# Patient Record
Sex: Male | Born: 1950 | Race: White | Hispanic: No | Marital: Married | Smoking: Former smoker
Health system: Southern US, Community
[De-identification: ages and names within clinical notes are randomized; demographics above are authoritative.]

## PROBLEM LIST (undated history)

## (undated) DIAGNOSIS — K219 Gastro-esophageal reflux disease without esophagitis: Secondary | ICD-10-CM

## (undated) DIAGNOSIS — R9431 Abnormal electrocardiogram [ECG] [EKG]: Secondary | ICD-10-CM

## (undated) DIAGNOSIS — R3129 Other microscopic hematuria: Secondary | ICD-10-CM

## (undated) DIAGNOSIS — I8390 Asymptomatic varicose veins of unspecified lower extremity: Secondary | ICD-10-CM

## (undated) DIAGNOSIS — I10 Essential (primary) hypertension: Secondary | ICD-10-CM

## (undated) DIAGNOSIS — Z953 Presence of xenogenic heart valve: Secondary | ICD-10-CM

## (undated) DIAGNOSIS — I509 Heart failure, unspecified: Secondary | ICD-10-CM

## (undated) DIAGNOSIS — T7840XA Allergy, unspecified, initial encounter: Secondary | ICD-10-CM

## (undated) DIAGNOSIS — J90 Pleural effusion, not elsewhere classified: Secondary | ICD-10-CM

## (undated) DIAGNOSIS — I4891 Unspecified atrial fibrillation: Secondary | ICD-10-CM

## (undated) DIAGNOSIS — H269 Unspecified cataract: Secondary | ICD-10-CM

## (undated) DIAGNOSIS — I7101 Dissection of thoracic aorta: Secondary | ICD-10-CM

## (undated) DIAGNOSIS — M1990 Unspecified osteoarthritis, unspecified site: Secondary | ICD-10-CM

## (undated) DIAGNOSIS — E785 Hyperlipidemia, unspecified: Secondary | ICD-10-CM

## (undated) DIAGNOSIS — N400 Enlarged prostate without lower urinary tract symptoms: Secondary | ICD-10-CM

## (undated) DIAGNOSIS — J9622 Acute and chronic respiratory failure with hypercapnia: Secondary | ICD-10-CM

## (undated) DIAGNOSIS — Z9889 Other specified postprocedural states: Secondary | ICD-10-CM

## (undated) HISTORY — DX: Abnormal electrocardiogram (ECG) (EKG): R94.31

## (undated) HISTORY — PX: TRACHEOSTOMY: SUR1362

## (undated) HISTORY — DX: Pleural effusion, not elsewhere classified: J90

## (undated) HISTORY — DX: Other microscopic hematuria: R31.29

## (undated) HISTORY — PX: COLONOSCOPY: SHX174

## (undated) HISTORY — PX: ROTATOR CUFF REPAIR: SHX139

## (undated) HISTORY — DX: Gastro-esophageal reflux disease without esophagitis: K21.9

## (undated) HISTORY — DX: Unspecified cataract: H26.9

## (undated) HISTORY — DX: Allergy, unspecified, initial encounter: T78.40XA

## (undated) HISTORY — DX: Unspecified osteoarthritis, unspecified site: M19.90

## (undated) HISTORY — DX: Unspecified atrial fibrillation: I48.91

## (undated) HISTORY — DX: Benign prostatic hyperplasia without lower urinary tract symptoms: N40.0

## (undated) HISTORY — PX: CARDIAC CATHETERIZATION: SHX172

## (undated) HISTORY — DX: Essential (primary) hypertension: I10

## (undated) HISTORY — PX: EYE SURGERY: SHX253

## (undated) HISTORY — DX: Hyperlipidemia, unspecified: E78.5

## (undated) HISTORY — DX: Asymptomatic varicose veins of unspecified lower extremity: I83.90

## (undated) HISTORY — PX: POLYPECTOMY: SHX149

## (undated) HISTORY — PX: APPENDECTOMY: SHX54

---

## 1976-08-10 HISTORY — PX: CYSTOSCOPY: SUR368

## 1984-08-10 HISTORY — PX: SPINAL FUSION: SHX223

## 1985-08-10 HISTORY — PX: LUMBAR LAMINECTOMY: SHX95

## 2000-03-09 ENCOUNTER — Encounter: Payer: Self-pay | Admitting: Internal Medicine

## 2000-03-09 ENCOUNTER — Inpatient Hospital Stay (HOSPITAL_COMMUNITY): Admission: EM | Admit: 2000-03-09 | Discharge: 2000-03-12 | Payer: Self-pay

## 2000-06-13 ENCOUNTER — Ambulatory Visit (HOSPITAL_COMMUNITY): Admission: RE | Admit: 2000-06-13 | Discharge: 2000-06-13 | Payer: Self-pay | Admitting: Specialist

## 2000-06-13 ENCOUNTER — Encounter: Payer: Self-pay | Admitting: Specialist

## 2002-01-06 ENCOUNTER — Ambulatory Visit (HOSPITAL_BASED_OUTPATIENT_CLINIC_OR_DEPARTMENT_OTHER): Admission: RE | Admit: 2002-01-06 | Discharge: 2002-01-06 | Payer: Self-pay | Admitting: Pulmonary Disease

## 2003-03-01 ENCOUNTER — Encounter: Payer: Self-pay | Admitting: Cardiology

## 2003-03-01 ENCOUNTER — Ambulatory Visit (HOSPITAL_COMMUNITY): Admission: RE | Admit: 2003-03-01 | Discharge: 2003-03-01 | Payer: Self-pay | Admitting: Cardiology

## 2003-10-19 ENCOUNTER — Encounter: Payer: Self-pay | Admitting: Internal Medicine

## 2004-10-31 ENCOUNTER — Ambulatory Visit: Payer: Self-pay | Admitting: Internal Medicine

## 2005-01-23 ENCOUNTER — Ambulatory Visit: Payer: Self-pay | Admitting: Internal Medicine

## 2005-02-06 ENCOUNTER — Ambulatory Visit: Payer: Self-pay | Admitting: Internal Medicine

## 2005-08-10 HISTORY — PX: MYELOGRAM: SHX5347

## 2005-09-23 ENCOUNTER — Ambulatory Visit: Payer: Self-pay | Admitting: Internal Medicine

## 2005-09-28 ENCOUNTER — Inpatient Hospital Stay (HOSPITAL_COMMUNITY): Admission: EM | Admit: 2005-09-28 | Discharge: 2005-09-29 | Payer: Self-pay | Admitting: Emergency Medicine

## 2005-09-28 ENCOUNTER — Ambulatory Visit: Payer: Self-pay | Admitting: Internal Medicine

## 2005-09-28 ENCOUNTER — Encounter: Payer: Self-pay | Admitting: Cardiology

## 2005-10-09 ENCOUNTER — Ambulatory Visit: Payer: Self-pay | Admitting: Cardiology

## 2005-10-29 ENCOUNTER — Encounter: Admission: RE | Admit: 2005-10-29 | Discharge: 2005-10-29 | Payer: Self-pay | Admitting: Orthopedic Surgery

## 2005-11-06 ENCOUNTER — Ambulatory Visit: Payer: Self-pay | Admitting: Internal Medicine

## 2005-11-27 ENCOUNTER — Ambulatory Visit: Payer: Self-pay | Admitting: Internal Medicine

## 2006-04-16 ENCOUNTER — Ambulatory Visit: Payer: Self-pay | Admitting: Internal Medicine

## 2006-07-09 ENCOUNTER — Ambulatory Visit: Payer: Self-pay | Admitting: Internal Medicine

## 2006-11-12 ENCOUNTER — Ambulatory Visit: Payer: Self-pay | Admitting: Internal Medicine

## 2006-11-12 LAB — CONVERTED CEMR LAB
Alkaline Phosphatase: 83 units/L (ref 39–117)
BUN: 6 mg/dL (ref 6–23)
Basophils Relative: 0.6 % (ref 0.0–1.0)
Bilirubin, Direct: 0.1 mg/dL (ref 0.0–0.3)
CO2: 33 meq/L — ABNORMAL HIGH (ref 19–32)
Calcium: 9.1 mg/dL (ref 8.4–10.5)
Cholesterol: 209 mg/dL (ref 0–200)
Direct LDL: 147.4 mg/dL
Eosinophils Absolute: 0.2 10*3/uL (ref 0.0–0.6)
GFR calc Af Amer: 129 mL/min
GFR calc non Af Amer: 106 mL/min
Glucose, Bld: 91 mg/dL (ref 70–99)
HDL: 47.3 mg/dL (ref >39.0)
Hemoglobin: 16.8 g/dL (ref 13.0–17.0)
Lymphocytes Relative: 18.9 % (ref 12.0–46.0)
MCV: 98.4 fL (ref 78.0–100.0)
Monocytes Absolute: 0.6 10*3/uL (ref 0.2–0.7)
Monocytes Relative: 7.8 % (ref 3.0–11.0)
Neutro Abs: 5.6 10*3/uL (ref 1.4–7.7)
Platelets: 356 10*3/uL (ref 150–400)
Potassium: 4.2 meq/L (ref 3.5–5.1)
TSH: 0.93 microintl units/mL (ref 0.35–5.50)
Total Protein: 6.4 g/dL (ref 6.0–8.3)
VLDL: 27 mg/dL (ref 0–40)

## 2006-11-19 ENCOUNTER — Ambulatory Visit: Payer: Self-pay | Admitting: Internal Medicine

## 2007-12-08 ENCOUNTER — Ambulatory Visit: Payer: Self-pay | Admitting: Internal Medicine

## 2008-03-09 ENCOUNTER — Ambulatory Visit: Payer: Self-pay | Admitting: Internal Medicine

## 2008-03-09 DIAGNOSIS — N400 Enlarged prostate without lower urinary tract symptoms: Secondary | ICD-10-CM

## 2008-03-09 DIAGNOSIS — R3129 Other microscopic hematuria: Secondary | ICD-10-CM

## 2008-03-09 DIAGNOSIS — F17200 Nicotine dependence, unspecified, uncomplicated: Secondary | ICD-10-CM | POA: Insufficient documentation

## 2008-03-09 DIAGNOSIS — K219 Gastro-esophageal reflux disease without esophagitis: Secondary | ICD-10-CM

## 2008-03-09 DIAGNOSIS — I4891 Unspecified atrial fibrillation: Secondary | ICD-10-CM | POA: Insufficient documentation

## 2008-03-09 DIAGNOSIS — E785 Hyperlipidemia, unspecified: Secondary | ICD-10-CM | POA: Insufficient documentation

## 2008-03-09 LAB — CONVERTED CEMR LAB
Cholesterol, target level: 200 mg/dL
Glucose, Urine, Semiquant: NEGATIVE
Nitrite: NEGATIVE
Specific Gravity, Urine: 1.01
WBC Urine, dipstick: NEGATIVE
pH: 6.5

## 2008-03-10 ENCOUNTER — Encounter: Payer: Self-pay | Admitting: Internal Medicine

## 2008-03-12 ENCOUNTER — Encounter (INDEPENDENT_AMBULATORY_CARE_PROVIDER_SITE_OTHER): Payer: Self-pay | Admitting: *Deleted

## 2008-03-13 ENCOUNTER — Telehealth (INDEPENDENT_AMBULATORY_CARE_PROVIDER_SITE_OTHER): Payer: Self-pay | Admitting: *Deleted

## 2008-03-13 LAB — CONVERTED CEMR LAB
ALT: 21 units/L (ref 0–53)
Albumin: 4.1 g/dL (ref 3.5–5.2)
BUN: 8 mg/dL (ref 6–23)
Basophils Relative: 0.9 % (ref 0.0–3.0)
Bilirubin, Direct: 0.1 mg/dL (ref 0.0–0.3)
CO2: 30 meq/L (ref 19–32)
Calcium: 9.4 mg/dL (ref 8.4–10.5)
Eosinophils Relative: 2.8 % (ref 0.0–5.0)
Glucose, Bld: 87 mg/dL (ref 70–99)
HCT: 49.9 % (ref 39.0–52.0)
Hemoglobin: 17.1 g/dL — ABNORMAL HIGH (ref 13.0–17.0)
Lymphocytes Relative: 18.4 % (ref 12.0–46.0)
Monocytes Relative: 9 % (ref 3.0–12.0)
Neutro Abs: 4.4 10*3/uL (ref 1.4–7.7)
PSA: 0.34 ng/mL (ref 0.10–4.00)
RBC: 5.07 M/uL (ref 4.22–5.81)
RDW: 12 % (ref 11.5–14.6)
Sodium: 136 meq/L (ref 135–145)
Total CHOL/HDL Ratio: 5.5
Total Protein: 6.9 g/dL (ref 6.0–8.3)
VLDL: 25 mg/dL (ref 0–40)
WBC: 6.5 10*3/uL (ref 4.5–10.5)

## 2008-03-16 ENCOUNTER — Ambulatory Visit: Payer: Self-pay | Admitting: Internal Medicine

## 2008-03-19 ENCOUNTER — Encounter (INDEPENDENT_AMBULATORY_CARE_PROVIDER_SITE_OTHER): Payer: Self-pay | Admitting: *Deleted

## 2008-04-03 ENCOUNTER — Emergency Department (HOSPITAL_COMMUNITY): Admission: EM | Admit: 2008-04-03 | Discharge: 2008-04-03 | Payer: Self-pay | Admitting: Emergency Medicine

## 2008-08-21 ENCOUNTER — Ambulatory Visit: Payer: Self-pay | Admitting: Internal Medicine

## 2008-11-14 DIAGNOSIS — I4510 Unspecified right bundle-branch block: Secondary | ICD-10-CM

## 2008-11-15 ENCOUNTER — Encounter: Payer: Self-pay | Admitting: Internal Medicine

## 2008-11-15 ENCOUNTER — Ambulatory Visit: Payer: Self-pay | Admitting: Internal Medicine

## 2009-04-11 ENCOUNTER — Ambulatory Visit: Payer: Self-pay | Admitting: Internal Medicine

## 2009-04-18 ENCOUNTER — Ambulatory Visit: Payer: Self-pay | Admitting: Internal Medicine

## 2009-04-19 ENCOUNTER — Encounter (INDEPENDENT_AMBULATORY_CARE_PROVIDER_SITE_OTHER): Payer: Self-pay | Admitting: *Deleted

## 2009-04-19 LAB — CONVERTED CEMR LAB
ALT: 20 units/L (ref 0–53)
AST: 21 units/L (ref 0–37)
Albumin: 4.1 g/dL (ref 3.5–5.2)
Eosinophils Relative: 2.4 % (ref 0.0–5.0)
GFR calc non Af Amer: 81.18 mL/min (ref >60)
Glucose, Bld: 79 mg/dL (ref 70–99)
HCT: 48.4 % (ref 39.0–52.0)
HDL: 51.5 mg/dL (ref >39.00)
Hemoglobin: 16.9 g/dL (ref 13.0–17.0)
Lymphs Abs: 1.6 10*3/uL (ref 0.7–4.0)
Monocytes Relative: 8.9 % (ref 3.0–12.0)
Neutro Abs: 5.2 10*3/uL (ref 1.4–7.7)
Potassium: 4.7 meq/L (ref 3.5–5.1)
Sodium: 137 meq/L (ref 135–145)
TSH: 1.08 microintl units/mL (ref 0.35–5.50)
VLDL: 17.4 mg/dL (ref 0.0–40.0)
WBC: 7.7 10*3/uL (ref 4.5–10.5)

## 2009-04-24 ENCOUNTER — Encounter (INDEPENDENT_AMBULATORY_CARE_PROVIDER_SITE_OTHER): Payer: Self-pay | Admitting: *Deleted

## 2009-04-24 ENCOUNTER — Telehealth (INDEPENDENT_AMBULATORY_CARE_PROVIDER_SITE_OTHER): Payer: Self-pay | Admitting: *Deleted

## 2009-07-08 ENCOUNTER — Ambulatory Visit: Payer: Self-pay | Admitting: Internal Medicine

## 2009-07-09 ENCOUNTER — Encounter (INDEPENDENT_AMBULATORY_CARE_PROVIDER_SITE_OTHER): Payer: Self-pay | Admitting: *Deleted

## 2009-07-09 LAB — CONVERTED CEMR LAB
AST: 21 units/L (ref 0–37)
Alkaline Phosphatase: 81 units/L (ref 39–117)
Direct LDL: 152.5 mg/dL
Total Bilirubin: 1.1 mg/dL (ref 0.3–1.2)
Total CHOL/HDL Ratio: 4
Triglycerides: 104 mg/dL (ref 0.0–149.0)

## 2009-08-10 HISTORY — PX: SHOULDER ARTHROSCOPY: SHX128

## 2009-08-26 ENCOUNTER — Ambulatory Visit (HOSPITAL_COMMUNITY): Admission: RE | Admit: 2009-08-26 | Discharge: 2009-08-26 | Payer: Self-pay | Admitting: Specialist

## 2009-09-26 ENCOUNTER — Telehealth (INDEPENDENT_AMBULATORY_CARE_PROVIDER_SITE_OTHER): Payer: Self-pay | Admitting: *Deleted

## 2009-11-12 ENCOUNTER — Telehealth: Payer: Self-pay | Admitting: Internal Medicine

## 2009-11-13 ENCOUNTER — Telehealth (INDEPENDENT_AMBULATORY_CARE_PROVIDER_SITE_OTHER): Payer: Self-pay | Admitting: *Deleted

## 2009-12-02 ENCOUNTER — Ambulatory Visit: Payer: Self-pay | Admitting: Internal Medicine

## 2009-12-09 LAB — CONVERTED CEMR LAB
ALT: 21 units/L (ref 0–53)
Albumin: 4.1 g/dL (ref 3.5–5.2)
HDL: 56.6 mg/dL (ref >39.00)
Total Bilirubin: 0.7 mg/dL (ref 0.3–1.2)
VLDL: 21.8 mg/dL (ref 0.0–40.0)

## 2009-12-12 ENCOUNTER — Ambulatory Visit: Payer: Self-pay | Admitting: Internal Medicine

## 2009-12-17 ENCOUNTER — Telehealth (INDEPENDENT_AMBULATORY_CARE_PROVIDER_SITE_OTHER): Payer: Self-pay | Admitting: *Deleted

## 2009-12-18 ENCOUNTER — Encounter (HOSPITAL_COMMUNITY): Admission: RE | Admit: 2009-12-18 | Discharge: 2010-02-07 | Payer: Self-pay | Admitting: Internal Medicine

## 2009-12-18 ENCOUNTER — Ambulatory Visit: Payer: Self-pay | Admitting: Cardiology

## 2009-12-18 ENCOUNTER — Ambulatory Visit: Payer: Self-pay

## 2010-04-16 ENCOUNTER — Ambulatory Visit: Payer: Self-pay | Admitting: Internal Medicine

## 2010-04-16 LAB — CONVERTED CEMR LAB
Glucose, Urine, Semiquant: NEGATIVE
Nitrite: NEGATIVE
Protein, U semiquant: NEGATIVE
WBC Urine, dipstick: NEGATIVE
pH: 6

## 2010-04-17 ENCOUNTER — Encounter: Payer: Self-pay | Admitting: Internal Medicine

## 2010-04-21 ENCOUNTER — Telehealth (INDEPENDENT_AMBULATORY_CARE_PROVIDER_SITE_OTHER): Payer: Self-pay | Admitting: *Deleted

## 2010-04-21 LAB — CONVERTED CEMR LAB
ALT: 25 units/L (ref 0–53)
AST: 22 units/L (ref 0–37)
Basophils Relative: 0.2 % (ref 0.0–3.0)
Chloride: 101 meq/L (ref 96–112)
Eosinophils Relative: 1.7 % (ref 0.0–5.0)
GFR calc non Af Amer: 101.72 mL/min (ref >60)
HCT: 44.8 % (ref 39.0–52.0)
HDL: 55.7 mg/dL (ref >39.00)
Hemoglobin: 15.6 g/dL (ref 13.0–17.0)
Lymphs Abs: 1.6 10*3/uL (ref 0.7–4.0)
MCV: 99.5 fL (ref 78.0–100.0)
Monocytes Absolute: 0.6 10*3/uL (ref 0.1–1.0)
Monocytes Relative: 7.5 % (ref 3.0–12.0)
Neutro Abs: 5.8 10*3/uL (ref 1.4–7.7)
Potassium: 4.6 meq/L (ref 3.5–5.1)
RBC: 4.5 M/uL (ref 4.22–5.81)
Sodium: 139 meq/L (ref 135–145)
TSH: 1.11 microintl units/mL (ref 0.35–5.50)
Total Bilirubin: 0.4 mg/dL (ref 0.3–1.2)
Total Protein: 6.9 g/dL (ref 6.0–8.3)
VLDL: 35.6 mg/dL (ref 0.0–40.0)
WBC: 8.2 10*3/uL (ref 4.5–10.5)

## 2010-05-21 ENCOUNTER — Telehealth: Payer: Self-pay | Admitting: Internal Medicine

## 2010-08-10 HISTORY — PX: KNEE ARTHROSCOPY: SHX127

## 2010-08-20 ENCOUNTER — Ambulatory Visit
Admission: RE | Admit: 2010-08-20 | Discharge: 2010-08-20 | Payer: Self-pay | Source: Home / Self Care | Attending: Internal Medicine | Admitting: Internal Medicine

## 2010-08-20 DIAGNOSIS — J019 Acute sinusitis, unspecified: Secondary | ICD-10-CM | POA: Insufficient documentation

## 2010-09-09 NOTE — Progress Notes (Signed)
  Phone Note From Other Clinic   Caller: [facility] Details for Reason: Pt.Information Initial call taken by: KM    Faxed all Cardiac over to [facility] @ [phone] [facility]  September 26, 2009 10:07 AM

## 2010-09-09 NOTE — Progress Notes (Signed)
Summary: Culture Results  Phone Note Outgoing Call Call back at [facility] Phone [phone]   Call placed by: Shonna Chock CMA,  April 21, 2010 8:25 AM Call placed to: Patient Summary of Call: Spoke with patient in ref to Urine Culture: Significant would be > 100,000 colonies. Force water to 40 oz/ day. Macrodantin 100 mg two times a day #14 only if  symptoms appear. Hopp  Patient ok'd information and had no questions at the time of call Shonna Chock CMA  April 21, 2010 8:25 AM

## 2010-09-09 NOTE — Assessment & Plan Note (Signed)
Summary: 1 year rov   History of Present Illness: [name] returns in follow for atrial fibrillation. He continues to take flecainide he's been largely asymptomatic. he is smoking still but only down to a pack a day. He is rolling his own cigarettes and he thinks that these are less toxic.  Current Medications (verified): 1)  Cartia Xt 180 Mg  Xr24h-Cap (Diltiazem Hcl Coated Beads) .Marland Kitchen.. 1 By Mouth Once Daily 2)  Nexium 40 Mg  Pack (Esomeprazole Magnesium) .Marland Kitchen.. 1 By Mouth Once Daily 3)  Flecainide Acetate 100 Mg  Tabs (Flecainide Acetate) .Marland Kitchen.. 1 By Mouth Two Times A Day 4)  Viagra 100 Mg  Tabs (Sildenafil Citrate) .... 1/2 -1 Once Daily Prn 5)  Crestor 20 Mg Tabs (Rosuvastatin Calcium) .Marland Kitchen.. 1 Pill Every Wed and Sat  Allergies: 1)  ! Nsaids 2)  ! Celebrex 3)  ! Keflex 4)  ! Zocor 5)  ! Codeine  Past History:  Past Medical History: Last updated: 04/11/2009 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 LUTS (ICD-600.90) MICROSCOPIC HEMATURIA (ICD-599.72), Dr Karalee Height SMOKER (ICD-305.1) HYPERLIPIDEMIA (ICD-272.4), LDL goal = < 120 GERD (ICD-530.81) ATRIAL FIBRILLATION (ICD-427.31), Dr Berton Mount  Vital Signs:  Patient profile:   60 year old male Height:      72.5 inches Weight:      192 pounds BMI:     25.77 Pulse rate:   68 / minute BP sitting:   110 / 80  (left arm)  Vitals Entered By: Laurance Flatten CMA (Dec 12, 2009 2:34 PM)  Physical Exam  General:  The patient was alert and oriented in no acute distress. HEENT Normal.  Neck veins were flat, carotids were brisk.  Lungs were clear.  Heart sounds were regular without murmurs or gallops.  Abdomen was soft with active bowel sounds. There is no clubbing cyanosis or edema. Skin Warm and dry    EKG  Procedure date:  12/12/2009  Findings:      sus rhythm at 68 Intervals 0.16/0.13/0.42 Right bundle branch block  Impression & Recommendations:  Problem # 1:  ATRIAL FIBRILLATION (ICD-427.31)  Maintaining  sinus rhythm on flecainide. As best as I can tell it has been 7 years since his last stress test. We will need to undertake a repeat stress test on Mr. Kreis so as to make sure he we will be able to tolerate his flecainide. His updated medication list for this problem includes:    Flecainide Acetate 100 Mg Tabs (Flecainide acetate) .Marland Kitchen... 1 by mouth two times a day  His updated medication list for this problem includes:    Flecainide Acetate 100 Mg Tabs (Flecainide acetate) .Marland Kitchen... 1 by mouth two times a day  Orders: Nuclear Stress Test (Nuc Stress Test)  Problem # 2:  NONSPECIFIC ABNORMAL ELECTROCARDIOGRAM (ICD-794.31)  Unchanged RBBB His updated medication list for this problem includes:    Cartia Xt 180 Mg Xr24h-cap (Diltiazem hcl coated beads) .Marland Kitchen... 1 by mouth once daily    Flecainide Acetate 100 Mg Tabs (Flecainide acetate) .Marland Kitchen... 1 by mouth two times a day  His updated medication list for this problem includes:    Cartia Xt 180 Mg Xr24h-cap (Diltiazem hcl coated beads) .Marland Kitchen... 1 by mouth once daily    Flecainide Acetate 100 Mg Tabs (Flecainide acetate) .Marland Kitchen... 1 by mouth two times a day  Problem # 3:  SMOKER (ICD-305.1)  Encouraged again today to stop smoking  Orders: Nuclear Stress Test (Nuc Stress Test)  Patient Instructions: 1)  Your physician recommends that you schedule a follow-up appointment in: 12 months with Dr Graciela Husbands 2)  Your physician has requested that you have an exercise stress myoview.  For further information please visit [email or web address]  Please follow instruction sheet, as given. Prescriptions: FLECAINIDE ACETATE 100 MG  TABS (FLECAINIDE ACETATE) 1 by mouth two times a day  #180 x 3   Entered by:   Dennis Bast, RN, BSN   Authorized by:   Nathen May, MD, [facility]   Signed by:   Dennis Bast, RN, BSN on 12/12/2009   Method used:   Electronically to        [facility]* (mail-order)             ,          Ph: [phone]       Fax: [phone]    RxID:   [id number] CARTIA XT 180 MG  XR24H-CAP (DILTIAZEM HCL COATED BEADS) 1 by mouth once daily  #90 x 3   Entered by:   Dennis Bast, RN, BSN   Authorized by:   Nathen May, MD, [facility]   Signed by:   Dennis Bast, RN, BSN on 12/12/2009   Method used:   Electronically to        [facility]* (mail-order)             ,          Ph: [phone]       Fax: [phone]   RxID:   [id number] CARTIA XT 180 MG  XR24H-CAP (DILTIAZEM HCL COATED BEADS) 1 by mouth once daily  #90 x 3   Entered by:   Dennis Bast, RN, BSN   Authorized by:   Nathen May, MD, [facility]   Signed by:   Dennis Bast, RN, BSN on 12/12/2009   Method used:   [facility]  [address] [phone]* (retail)       [address]       [place], [place]  [address]       Ph: [phone] or [phone]       Fax: [phone]   RxID:   [id number] FLECAINIDE ACETATE 100 MG  TABS (FLECAINIDE ACETATE) 1 by mouth two times a day  #180 x 3   Entered by:   Dennis Bast, RN, BSN   Authorized by:   Nathen May, MD, [facility]   Signed by:   Dennis Bast, RN, BSN on 12/12/2009   Method used:   [facility] to        [facility] [phone][facility] (retail)       [address]       [place], [place]  [address]       Ph: [phone] or [phone]       Fax: [phone]   RxID:   [id number]

## 2010-09-09 NOTE — Assessment & Plan Note (Signed)
Summary: Cardiology Nuclear Study  Nuclear Med Background Indications for Stress Test: Evaluation for Ischemia, Abnormal EKG   History: Echo, Heart Catheterization, Myocardial Perfusion Study, MVP  History Comments: 7/04 MPS: inferior Lat., inferoSeptal ischemia 7/04 Heart Cath: EF= 65%, False (+) cardiolite '07 Echo: EF=NL, borderline MVP     Nuclear Pre-Procedure Cardiac Risk Factors: Lipids, RBBB, Smoker Caffeine/Decaff Intake: None NPO After: 12:00 AM Lungs: clear IV 0.9% NS with Angio Cath: 22g     IV Site: (R) AC IV Started by: Irean Hong RN Chest Size (in) 46     Height (in): 72.5 Weight (lb): 191 BMI: 25.64 Tech Comments: Last dose flecainide this am. This patient started on the treadmill, walked 8:15 when he started having knee problems and couldn't finish. He was switched to [facility] without any problems.  Nuclear Med Study 1 or 2 day study:  1 day     Stress Test Type:  Lexiscan Reading MD:  Willa Rough, MD     Referring MD:  S.Klein Resting Radionuclide:  Technetium 84m Tetrofosmin     Resting Radionuclide Dose:  11 mCi  Stress Radionuclide:  Technetium 75m Tetrofosmin     Stress Radionuclide Dose:  33 mCi   Stress Protocol Exercise Time (min):  8:15 min     Max HR:  121 bpm     Predicted Max HR:  160 bpm  Max Systolic BP: 178 mm Hg     Percent Max HR:  75.63 %     METS: 10.40 Rate Pressure Product:  16109  Lexiscan: 0.4 mg   Stress Test Technologist:  Milana Na EMT-P     Nuclear Technologist:  Domenic Polite CNMT  Rest Procedure  Myocardial perfusion imaging was performed at rest 45 minutes following the intravenous administration of Myoview Technetium 48m Tetrofosmin.  Stress Procedure  `The patient received IV Lexiscan 0.4 mg over 15-seconds.  Myoview injected at 30-seconds.  There were no significant changes with infusion.  Quantitative spect images were obtained after a 45 minute delay.  QPS Raw Data Images:  Patient motion noted;  appropriate software correction applied. Stress Images:  Mild decrease in activity in the inferior wall Rest Images:  Similar to stress. Subtraction (SDS):  No evidence of ischemia. Transient Ischemic Dilatation:  1  (Normal <1.22)  Lung/Heart Ratio:  .34  (Normal <0.45)  Quantitative Gated Spect Images QGS EDV:  131 ml QGS ESV:  58 ml QGS EF:  56 % QGS cine images:  Wall motion normal.  Findings Low risk nuclear study      Overall Impression  Exercise Capacity: Lexiscan BP Response: Normal blood pressure response. Clinical Symptoms: SOB ECG Impression: No significant ST segment change suggestive of ischemia. Overall Impression Comments: There is  decrease in activity in the inferior wall. Wall motion is normal in this area. This is most likely diaphragmatic attenuation. However some scar and/or ischemia can not be ruled out  Appended Document: Cardiology Nuclear Study low rsik will continue flecanide  Appended Document: Cardiology Nuclear Study Patient notified

## 2010-09-09 NOTE — Progress Notes (Signed)
Summary: Nuclear Pre-Procedure  Phone Note Outgoing Call Call back at [facility] Phone [phone]   Call placed by: Stanton Kidney, EMT-P,  Dec 17, 2009 1:37 PM Action Taken: Phone Call Completed Summary of Call: Reviewed information on Myoview Information Sheet (see scanned document for further details).  Spoke with Patient's wife.    Nuclear Med Background Indications for Stress Test: Evaluation for Ischemia, Abnormal EKG   History: Echo, Heart Catheterization, Myocardial Perfusion Study, MVP  History Comments: 7/04 MPS: inferior Lat., inferoSeptal ischemia 7/04 Heart Cath: EF= 65%, False (+) cardiolite '07 Echo: EF=NL, borderline MVP     Nuclear Pre-Procedure Cardiac Risk Factors: Lipids, RBBB, Smoker Height (in): 72.5

## 2010-09-09 NOTE — Assessment & Plan Note (Signed)
Summary: CPX--PH   Vital Signs:  Patient profile:   60 year old male Height:      73.25 inches Weight:      195.6 pounds BMI:     25.72 Temp:     98.0 degrees F oral Pulse rate:   76 / minute Resp:     16 per minute BP sitting:   120 / 78  (left arm) Cuff size:   large  Vitals Entered By: Shonna Chock CMA (April 16, 2010 8:16 AM)    History of Present Illness: Sean Riley is here for a physical; he is asymptomatic. Hyperlipidemia Follow-Up      This is a 60 year old man who presents for Hyperlipidemia follow-up.  The patient denies muscle aches, GI upset, abdominal pain, flushing, itching, constipation, diarrhea, and fatigue.  The patient denies the following symptoms: chest pain/pressure, exercise intolerance, dypsnea, palpitations, syncope, and pedal edema.  Compliance with medications (by patient report) has been near 100%.  Dietary compliance has been fair.  The patient reports exercising daily.  Lexiscan Study 12/18/2009 negative for ischemia.LDL was @ goal in 11/2009(116). Heartburn      The patient also presents with Heartburn.  The patient reports acid reflux and sour taste in mouth, but denies epigastric pain, trouble swallowing, and weight loss.  The patient denies the following alarm features: melena, dysphagia, hematemesis, and vomiting.  Symptoms are worse with spicy foods.  The patient has found the following treatments to be effective: dietary changes and a PPI.    Lipid Management History:      Positive NCEP/ATP III risk factors include male age 61 years old or older and current tobacco user.  Negative NCEP/ATP III risk factors include non-diabetic, no family history for ischemic heart disease, non-hypertensive, no ASHD (atherosclerotic heart disease), no prior stroke/TIA, no peripheral vascular disease, and no history of aortic aneurysm.     Preventive Screening-Counseling & Management  Alcohol-Tobacco     Smoking Cessation Counseling: yes  Current Medications  (verified): 1)  Cartia Xt 180 Mg  Xr24h-Cap (Diltiazem Hcl Coated Beads) .Marland Kitchen.. 1 By Mouth Once Daily 2)  Nexium 40 Mg  Pack (Esomeprazole Magnesium) .Marland Kitchen.. 1 By Mouth Once Daily 3)  Flecainide Acetate 100 Mg  Tabs (Flecainide Acetate) .Marland Kitchen.. 1 By Mouth Two Times A Day 4)  Viagra 100 Mg  Tabs (Sildenafil Citrate) .... 1/2 -1 Once Daily Prn 5)  Crestor 20 Mg Tabs (Rosuvastatin Calcium) .Marland Kitchen.. 1 Pill Every Wed and Sat  Allergies: 1)  ! Nsaids 2)  ! Celebrex 3)  ! Keflex 4)  ! Zocor 5)  ! Codeine  Past History:  Past Medical History: 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 LUTS (ICD-600.90) MICROSCOPIC HEMATURIA (ICD-599.72), Dr Lenor Derrick SMOKER (ICD-305.1) HYPERLIPIDEMIA (ICD-272.4): NMR 2005: LDL 181(2051/1011), HDL 49,TG 84. LDL goal = < 120. Framingham Study LDL goal= < 130. GERD (ICD-530.81) ATRIAL FIBRILLATION (ICD-427.31), Dr Berton Mount  Past Surgical History: Tracheotomy for croup age 36 ; Arthroscopy R shoulder & both knees, Dr Hayden Rasmussen; Myelogram 2007; Catheterization  negative  2001, 2005,2006 Appendectomy Lumbar laminectomy 1986, Dr Elesa Hacker & Lumbar fusion [phone] for ruptured disc, Dr Norlene Campbell Colonoscopy 2004 negative ; Endoscopy  negative  2002, Dr Jarold Motto;  Rotator cuff repair& bone revision 2011, Dr Thomasena Edis; L ulnar nerve surgery 2011, Dr Melvyn Novas  Family History: Father: HTN,prostate disease with ostruction & renal insufficiency Mother: HTN, Siblings:bro: AF; MGM: CAD  Social History: no diet Married  Tobacco Use: 1  ppd Alcohol Use : 6-8  beers a day; 4 -6 colas / day(Pepsi or DrPepper) Drug Use - no Retired; full time farmer  Review of Systems  The patient denies anorexia, fever, vision loss, decreased hearing, prolonged cough, headaches, hemoptysis, hematuria, incontinence, suspicious skin lesions, depression, unusual weight change, abnormal bleeding, enlarged lymph nodes, and angioedema.         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 No apparent alopecia ; moustache Eyes:  No corneal or conjunctival inflammation noted. Perrla. Funduscopic exam benign, without hemorrhages, exudates or papilledema. Ears:  External ear exam shows no significant lesions or deformities.  Otoscopic examination reveals clear canals, tympanic membranes are intact bilaterally without bulging, retraction, inflammation or discharge. Hearing is grossly normal bilaterally. Nose:  External nasal examination shows no deformity or inflammation. L nasal mucosa  boggy Mouth:  Oral mucosa and oropharynx without lesions or exudates.  Teeth in good repair.Marked pharyngeal erythema.  Hoarse Neck:  No deformities, masses, or tenderness noted. Lungs:  Normal respiratory effort, chest expands symmetrically. Lungs : mild rhonchi Heart:  Normal rate and regular rhythm. S1 and S2 normal without gallop, murmur, click, rub . S4 Abdomen:  Bowel sounds positive,abdomen soft and non-tender without masses, organomegaly or hernias noted. Rectal:  External  tags  noted. Normal sphincter tone. No rectal masses or tenderness. Genitalia:  Testes bilaterally descended without nodularity, tenderness or masses. No scrotal masses or lesions. No penis lesions or urethral discharge. L varicocele.   Prostate:  no nodules, no induration, and L asymmetrical enlargement.   Pulses:  R and L carotid,radial,dorsalis pedis and posterior tibial pulses are full and equal bilaterally Extremities:  No clubbing, cyanosis, edema, or deformity noted with normal full range of motion of all joints.  Varicose veins LLE > RLE Neurologic:  alert & oriented X3 and DTRs symmetrical and normal.   Skin:  Intact without suspicious lesions or rashes Cervical Nodes:  No lymphadenopathy noted Axillary Nodes:  No  palpable lymphadenopathy Inguinal Nodes:  No significant adenopathy Psych:  memory intact for recent and remote, normally interactive, and good eye contact.     Impression & Recommendations:  Problem # 1:  ROUTINE GENERAL MEDICAL EXAM@HEALTH  CARE FACL (ICD-V70.0)  Orders: T-2 View CXR (71020TC) Venipuncture (04540) TLB-Lipid Panel (80061-LIPID) TLB-BMP (Basic Metabolic Panel-BMET) (80048-METABOL) TLB-CBC Platelet - w/Differential (85025-CBCD) TLB-Hepatic/Liver Function Pnl (80076-HEPATIC) TLB-TSH (Thyroid Stimulating Hormone) (84443-TSH) TLB-PSA (Prostate Specific Antigen) (84153-PSA)  Problem # 2:  HYPERLIPIDEMIA (ICD-272.4)  His updated medication list for this problem includes:    Crestor 20 Mg Tabs (Rosuvastatin calcium) .Marland Kitchen... 1 pill every wed and sat  Problem # 3:  SMOKER (ICD-305.1) Risks discussed Orders: T-2 View CXR (71020TC) Tobacco use cessation intermediate 3-10 minutes (99406)  Problem # 4:  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 LUTS (ICD-600.90)  Problem # 5:  GERD (ICD-530.81)  His updated medication list for this problem includes:    Nexium 40 Mg Pack (Esomeprazole magnesium) .Marland Kitchen... 1 by mouth once daily  Problem # 6:  MICROSCOPIC HEMATURIA (ICD-599.72)  Orders: T-2 View CXR (71020TC)  Complete Medication List: 1)  Cartia Xt 180 Mg Xr24h-cap (Diltiazem hcl coated beads) .Marland Kitchen.. 1 by mouth once daily 2)  Nexium 40 Mg Pack (Esomeprazole magnesium) .Marland Kitchen.. 1 by mouth once daily 3)  Flecainide Acetate 100 Mg Tabs (Flecainide acetate) .Marland Kitchen.. 1 by  mouth two times a day 4)  Viagra 100 Mg Tabs (Sildenafil citrate) .... 1/2 -1 once daily prn 5)  Crestor 20 Mg Tabs (Rosuvastatin calcium) .Marland Kitchen.. 1 pill every wed and sat  Other Orders: Specimen Handling (11914) T-Culture, Urine (78295-62130) UA Dipstick w/o Micro (manual) (81002)  Lipid Assessment/Plan:      Based on NCEP/ATP III, the patient's risk factor category is "2 or more risk factors and a calculated 10  year CAD risk of < 20%".  The patient's lipid goals are as follows: Total cholesterol goal is 200; LDL cholesterol goal is 120; HDL cholesterol goal is 40; Triglyceride goal is 150.  His LDL cholesterol goal has not been met.  Secondary causes for hyperlipidemia have been ruled out.  He has been counseled on adjunctive measures for lowering his cholesterol and has been provided with dietary instructions.    Patient Instructions: 1)  Avoid foods high in acid (tomatoes, citrus juices, spicy foods). Avoid eating within two hours of lying down or before exercising. Do not over eat; try smaller more frequent meals. Elevate head of bed twelve inches when sleeping. 2)  Tobacco  has serious  health risks as we discussed. You Should stop smoking!. 3)  Stop Smoking Tips: Choose a Quit date. Cut down before the Quit date. decide what you will do as a substitute when you feel the urge to smoke(gum,toothpick,exercise).    Laboratory Results   Urine Tests   Date/Time Reported: April 16, 2010 8:27 AM   Routine Urinalysis   Color: yellow Appearance: Clear Glucose: negative   (Normal Range: Negative) Bilirubin: negative   (Normal Range: Negative) Ketone: negative   (Normal Range: Negative) Spec. Gravity: 1.010   (Normal Range: 1.003-1.035) Blood: small   (Normal Range: Negative) pH: 6.0   (Normal Range: 5.0-8.0) Protein: negative   (Normal Range: Negative) Urobilinogen: negative   (Normal Range: 0-1) Nitrite: negative   (Normal Range: Negative) Leukocyte Esterace: negative   (Normal Range: Negative)    Comments: Sean Riley  April 16, 2010 8:27 AM cx sent    Appended Document: CPX--PH

## 2010-09-09 NOTE — Progress Notes (Signed)
Summary: Refill--Viagra  Phone Note Refill Request Message from:  Spouse on May 21, 2010 11:29 AM  Refills Requested: Medication #1:  VIAGRA 100 MG  TABS 1/2 -1 once daily prn [facility] [address]  Initial call taken by: Lucious Groves CMA,  May 21, 2010 11:29 AM    Prescriptions: VIAGRA 100 MG  TABS (SILDENAFIL CITRATE) 1/2 -1 once daily prn  #6 x 6   Entered by:   Lucious Groves CMA   Authorized by:   Marga Melnick MD   Signed by:   Lucious Groves CMA on 05/21/2010   Method used:   [facility]        [facility]  [address] [id number]       [address]       [place], [place]  [address]       Ph: [phone] or [phone]       Fax: [phone]   RxID:   [id number]

## 2010-09-09 NOTE — Progress Notes (Signed)
Summary: WANT A PRESCRIPTION CALL IN FOR  CHANTIX  Phone Note Call from Patient Call back at Home Phone [phone]   Caller: Patient Summary of Call: PT WANT A PRESCRIPTION CALL IN FOR CHANTIX Initial call taken by: Judie Grieve,  November 12, 2009 3:25 PM  Follow-up for Phone Call        called pt to inform this would need to be follow by primary.  Made a call to  in [place].  This pt is pt of Dr. Caryl Never.  Spoke with Chrae who will contact the patient regarding this medication. Follow-up by: Judithe Modest CMA,  November 13, 2009 3:29 PM

## 2010-09-09 NOTE — Progress Notes (Signed)
Summary: Request for Chantix  Phone Note Outgoing Call   Call placed by: Sean Riley,  November 13, 2009 3:53 PM Call placed to: Patient Summary of Call: Side note: Got a call from Cardiologist-patient requesting RX for Chantix and this was never RX'ed by Cardiology. I will futher folllow-up with patient.  Dr.Hopper please advise if patient will need an appointment to get a RX for Chantix, or would you like to prescribe med and then have patient follow-up after trying?  Sean Riley  November 13, 2009 3:56 PM   Follow-up for Phone Call        Left message with patient's mother-in-law informing her RX sent to pharmacy Follow-up by: Sean Riley,  November 14, 2009 1:45 PM    New/Updated Medications: CHANTIX STARTING MONTH PAK 0.5 MG X 11 & 1 MG X 42 TABS (VARENICLINE TARTRATE) as per pack Prescriptions: CHANTIX STARTING MONTH PAK 0.5 MG X 11 & 1 MG X 42 TABS (VARENICLINE TARTRATE) as per pack  #1 month x 0   Entered and Authorized by:   Marga Melnick MD   Signed by:   Sean Riley on 11/14/2009   Method used:   Printed then faxed to ...       [facility]  [address] [phone]* (retail)       [address]       [place], [place]  [id number]       Ph: [phone] or [phone]       Fax: [phone]   RxID:   [id number]

## 2010-09-11 NOTE — Assessment & Plan Note (Signed)
Summary: URI/NTA   Vital Signs:  Patient profile:   60 year old male Weight:      200.0 pounds BMI:     26.30 Temp:     98.1 degrees F oral Pulse rate:   72 / minute Resp:     16 per minute BP sitting:   112 / 76  (left arm) Cuff size:   large  Vitals Entered By: Shonna Chock CMA (August 20, 2010 10:45 AM) CC: URI symptoms x 1 week    CC:  URI symptoms x 1 week .  History of Present Illness:      This is a 60 year old man who presents with RTI  symptoms; onset 1 week ago as rhinitis.  The patient  now reports nasal congestion, purulent nasal discharge, sore throat, and productive cough, but denies earache.  The patient denies fever, dyspnea, and wheezing.  The patient also reports headache.  Risk factors for Strep sinusitis include bilateral facial pain.  The patient denies the following risk factors for Strep sinusitis: tooth pain and tender adenopathy.  Rx: Neti pot , Robitussin  Current Medications (verified): 1)  Cartia Xt 180 Mg  Xr24h-Cap (Diltiazem Hcl Coated Beads) .Marland Kitchen.. 1 By Mouth Once Daily 2)  Nexium 40 Mg  Pack (Esomeprazole Magnesium) .Marland Kitchen.. 1 By Mouth Once Daily 3)  Flecainide Acetate 100 Mg  Tabs (Flecainide Acetate) .Marland Kitchen.. 1 By Mouth Two Times A Day 4)  Viagra 100 Mg  Tabs (Sildenafil Citrate) .... 1/2 -1 Once Daily Prn 5)  Crestor 20 Mg Tabs (Rosuvastatin Calcium) .Marland Kitchen.. 1 Pill Every Wed and Sat  Allergies: 1)  ! Nsaids 2)  ! Celebrex 3)  ! Keflex 4)  ! Zocor 5)  ! Codeine  Physical Exam  General:  in no acute distress; alert,appropriate and cooperative throughout examination Ears:  External ear exam shows no significant lesions or deformities.  Otoscopic examination reveals clear canals, tympanic membranes are intact bilaterally without bulging, retraction, inflammation or discharge. Hearing is grossly normal bilaterally. Nose:  External nasal examination shows no deformity or inflammation. Nasal mucosa are  boggy without lesions or exudates. Mouth:  Oral mucosa  and oropharynx without lesions or exudates.  Teeth in good repair. Marked pharyngeal erythema.   Lungs:  Normal respiratory effort, chest expands symmetrically. Lungs: mild rales, no  wheezes. Heart:  Normal rate and regular rhythm. S1 and S2 normal without gallop, murmur, click, rub or other extra sounds. Cervical Nodes:  No lymphadenopathy noted Axillary Nodes:  No palpable lymphadenopathy   Impression & Recommendations:  Problem # 1:  SINUSITIS- ACUTE-NOS (ICD-461.9)  His updated medication list for this problem includes:    Smz-tmp Ds 800-160 Mg Tabs (Sulfamethoxazole-trimethoprim) .Marland Kitchen... 1 two times a day with 8 oz water    Benzonatate 200 Mg Caps (Benzonatate) .Marland Kitchen... 1 eveery 6 hrs as needed cough  Problem # 2:  BRONCHITIS-ACUTE (ICD-466.0) RAD component His updated medication list for this problem includes:    Smz-tmp Ds 800-160 Mg Tabs (Sulfamethoxazole-trimethoprim) .Marland Kitchen... 1 two times a day with 8 oz water    Symbicort 160-4.5 Mcg/act Aero (Budesonide-formoterol fumarate) .Marland Kitchen... 1-2 sprays every 12 hrs ; gargle & spit after use    Benzonatate 200 Mg Caps (Benzonatate) .Marland Kitchen... 1 eveery 6 hrs as needed cough  Complete Medication List: 1)  Cartia Xt 180 Mg Xr24h-cap (Diltiazem hcl coated beads) .Marland Kitchen.. 1 by mouth once daily 2)  Nexium 40 Mg Pack (Esomeprazole magnesium) .Marland Kitchen.. 1 by mouth once daily 3)  Flecainide  Acetate 100 Mg Tabs (Flecainide acetate) .Marland Kitchen.. 1 by mouth two times a day 4)  Viagra 100 Mg Tabs (Sildenafil citrate) .... 1/2 -1 once daily prn 5)  Crestor 20 Mg Tabs (Rosuvastatin calcium) .Marland Kitchen.. 1 pill every wed and sat 6)  Smz-tmp Ds 800-160 Mg Tabs (Sulfamethoxazole-trimethoprim) .Marland Kitchen.. 1 two times a day with 8 oz water 7)  Symbicort 160-4.5 Mcg/act Aero (Budesonide-formoterol fumarate) .Marland Kitchen.. 1-2 sprays every 12 hrs ; gargle & spit after use 8)  Benzonatate 200 Mg Caps (Benzonatate) .Marland Kitchen.. 1 eveery 6 hrs as needed cough  Patient Instructions: 1)  Use ProAir Rxed by Dr Lucie Leather  1-2  puffs every 4 hrs as needed & Symbicort every 12 hrs until well. 2)  Drink as much NON dairy  fluid as you can tolerate for the next few days. 3)  Stop Smoking Tips: Choose a Quit date. Cut down before the Quit date. decide what you will do as a substitute when you feel the urge to smoke(gum,toothpick,exercise). Prescriptions: BENZONATATE 200 MG CAPS (BENZONATATE) 1 eveery 6 hrs as needed cough  #21 x 0   Entered and Authorized by:   Marga Melnick MD   Signed by:   Marga Melnick MD on 08/20/2010   Method used:   Print then Give to Patient   RxID:   [id number] SYMBICORT 160-4.5 MCG/ACT AERO (BUDESONIDE-FORMOTEROL FUMARATE) 1-2 sprays every 12 hrs ; gargle & spit after use  #1 x 11   Entered and Authorized by:   Marga Melnick MD   Signed by:   Marga Melnick MD on 08/20/2010   Method used:   Samples Given   RxID:   [id number] SMZ-TMP DS 800-160 MG TABS (SULFAMETHOXAZOLE-TRIMETHOPRIM) 1 two times a day with 8 oz water  #20 x 0   Entered and Authorized by:   Marga Melnick MD   Signed by:   Marga Melnick MD on 08/20/2010   Method used:   Print then Give to Patient   RxID:   [id number]    Orders Added: 1)  Est. Patient Level III [25366]

## 2010-10-24 ENCOUNTER — Telehealth (INDEPENDENT_AMBULATORY_CARE_PROVIDER_SITE_OTHER): Payer: Self-pay | Admitting: *Deleted

## 2010-11-06 ENCOUNTER — Other Ambulatory Visit: Payer: Self-pay

## 2010-11-06 NOTE — Progress Notes (Signed)
Summary: lab order  Phone Note Call from Patient   Summary of Call: patient scheduled for lab [phone] last lab was 09.07.11 - what labs should he repeat? Initial call taken by: Okey Regal Spring,  October 24, 2010 4:24 PM  Follow-up for Phone Call        Lipid.Hep 272.4/995.20 Follow-up by: Shonna Chock CMA,  October 24, 2010 5:01 PM  Additional Follow-up for Phone Call Additional follow up Details #1::        lab added to appt .Marland KitchenOkey Regal Spring  October 27, 2010 9:48 AM

## 2010-11-13 ENCOUNTER — Ambulatory Visit (HOSPITAL_COMMUNITY)
Admission: RE | Admit: 2010-11-13 | Discharge: 2010-11-13 | Disposition: A | Discharge status: Home / Self Care | Payer: BC Managed Care – PPO | Source: Ambulatory Visit | Attending: Specialist | Admitting: Specialist

## 2010-11-13 ENCOUNTER — Other Ambulatory Visit (HOSPITAL_COMMUNITY): Payer: Self-pay | Admitting: Specialist

## 2010-11-13 DIAGNOSIS — Z0189 Encounter for other specified special examinations: Secondary | ICD-10-CM | POA: Insufficient documentation

## 2010-11-13 DIAGNOSIS — M25569 Pain in unspecified knee: Secondary | ICD-10-CM

## 2010-12-12 ENCOUNTER — Encounter: Payer: Self-pay | Admitting: Internal Medicine

## 2010-12-12 DIAGNOSIS — K219 Gastro-esophageal reflux disease without esophagitis: Secondary | ICD-10-CM

## 2010-12-16 ENCOUNTER — Ambulatory Visit (INDEPENDENT_AMBULATORY_CARE_PROVIDER_SITE_OTHER): Payer: BC Managed Care – PPO | Admitting: Internal Medicine

## 2010-12-16 ENCOUNTER — Encounter: Payer: Self-pay | Admitting: Internal Medicine

## 2010-12-16 DIAGNOSIS — I4891 Unspecified atrial fibrillation: Secondary | ICD-10-CM

## 2010-12-16 DIAGNOSIS — F17200 Nicotine dependence, unspecified, uncomplicated: Secondary | ICD-10-CM

## 2010-12-16 MED ORDER — FLECAINIDE ACETATE 100 MG PO TABS: 100.0000 mg | ORAL_TABLET | Freq: Two times a day (BID) | ORAL | Status: DC

## 2010-12-16 MED ORDER — DILTIAZEM HCL ER COATED BEADS 180 MG PO CP24: 180.0000 mg | ORAL_CAPSULE | Freq: Every day | ORAL | Status: DC

## 2010-12-16 NOTE — Patient Instructions (Signed)
Your physician recommends that you continue on your current medications as directed. Please refer to the Current Medication list given to you today.  Your physician wants you to follow-up in: 1 year. You will receive a reminder letter in the mail two months in advance. If you don't receive a letter, please call our office to schedule the follow-up appointment.  

## 2010-12-16 NOTE — Assessment & Plan Note (Signed)
Controlled on flecainide. No symptoms at this point to precipitate a need for a new stress test

## 2010-12-16 NOTE — Progress Notes (Signed)
  HPI  Sean Riley is a 60 y.o. male Seen  in follow for atrial fibrillation. He continues to take flecainide he's been largely asymptomatic. he is smoking still but only down to a pack a day. He is rolling his own cigarettes and he thinks that these are less toxic.  The patient denies SOB, chest pain edema or palpitations.  There has been no syncope or presyncope.   Past Medical History  Diagnosis Date  . Atrial fibrillation   . HYPERLIPIDEMIA   . 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 Microscopic hematuria   . NONSPECIFIC ABNORMAL ELECTROCARDIOGRAM   . SINUSITIS- ACUTE-NOS   . SMOKER     Past Surgical History  Procedure Date  . Tracheotomy     age 19 for croup  . Knee arthroscopy   . Shoulder arthroscopy   . Coronary angioplasty   . Appendectomy   . Lumbar laminectomy 1987  . Rotator cuff repair     Current Outpatient Prescriptions  Medication Sig Dispense Refill  . benzonatate (TESSALON) 200 MG capsule Take 200 mg by mouth as needed.        . budesonide-formoterol (SYMBICORT) 160-4.5 MCG/ACT inhaler Inhale 2 puffs into the lungs 2 (two) times daily.        Marland Kitchen diltiazem (CARDIZEM CD) 180 MG 24 hr capsule Take 180 mg by mouth daily.        Marland Kitchen esomeprazole (NEXIUM) 40 MG packet Take 40 mg by mouth daily before breakfast.        . flecainide (TAMBOCOR) 100 MG tablet Take 100 mg by mouth 2 (two) times daily.        . rosuvastatin (CRESTOR) 20 MG tablet Take by mouth. 1 tablet every wed. And sat.       . sildenafil (VIAGRA) 100 MG tablet Take 100 mg by mouth daily as needed.        Marland Kitchen DISCONTD: sulfamethoxazole-trimethoprim (BACTRIM DS) 800-160 MG per tablet Take 1 tablet by mouth 2 (two) times daily.          Allergies  Allergen Reactions  . Celecoxib     REACTION: GI UPSET  . Cephalexin     REACTION: RASH  . Codeine     REACTION: GI UPSET  . Nsaids     REACTION: GI UPSET  . Simvastatin     REACTION: INTOLERANCE    Review of Systems negative  except from HPI and PMH  Physical Exam Well developed and well nourished in no acute distress HENT normal E scleral and icterus clear Neck Supple JVP flat; carotids brisk and full Clear to ausculation Regular rate and rhythm, no murmurs gallops or rub Soft with active bowel sounds No clubbing cyanosis and edema Alert and oriented, grossly normal motor and sensory function Skin Warm and Dry  ECG demonstrates sinus rhythm at 73 Intervals 0.16 platelet one 0/140 Axis is 15 Incomplete right bundle branch block   Assessment and  Plan

## 2010-12-16 NOTE — Assessment & Plan Note (Signed)
Discussed strategies again

## 2010-12-23 NOTE — Assessment & Plan Note (Signed)
[facility]                         ELECTROPHYSIOLOGY OFFICE NOTE   NAME:Sean Riley, Sean Riley                     MRN:          [id number]  DATE:12/08/2007                            DOB:          [birth year]    [name] has paroxysmal atrial fibrillation which is currently well-  controlled flecainide.  He has had no recurrent tachy palpitations.  He  continues to smoke.  His breathing pattern has not changed.  He has had  no new associated chest discomfort.  His medications, in addition to  flecainide, include Diltiazem 180 and Prevacid 30.   EXAMINATION:  VITAL SIGNS:  On examination his blood pressure is 118/74,  the pulse of 60.  LUNGS:  The lungs were clear.  HEART:  Sounds were regular.  EXTREMITIES:  Were without edema.   IMPRESSION:  1. Paroxysmal atrial fibrillation.  2. Ongoing cigarette use.  3. Right bundle branch block.   [name] stable.  We will see him again in 12 months' time and will  refill his prescriptions.     Duke Salvia, MD, [facility]  Electronically Signed    SCK/MedQ  DD: 12/08/2007  DT: 12/08/2007  Job #: [id number]

## 2010-12-26 NOTE — Assessment & Plan Note (Signed)
[facility]                         ELECTROPHYSIOLOGY OFFICE NOTE   NAME:Soden, DERRIEN ANSCHUTZ                     MRN:          [id number]  DATE:11/19/2006                            DOB:          07-05-1951    Mr. Farver comes in.  He has paroxysmal atrial fibrillation.  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  He is  taking his flecainide 100 mg twice daily, and continuing to smoke. His  other medications includes Diltiazem 180.   PHYSICAL EXAMINATION:  On examination today his blood pressure was  132/84, pulse was 76.  Lungs were clear.  Heart sounds were regular.  The extremities were without edema.   Electrocardiogram dated today demonstrated sinus rhythm at 70 with  intervals of 0.16/0.11/.039.  There was an RSR prime in lead V1.   IMPRESSION:  1. Paroxysmal atrial fibrillation.  2. RSR prime.  3. Flecainide therapy for #1.   Mr. Ishaq is stable.  We will see him again in 1 year's time.  I have  refilled his prescriptions today.     Duke Salvia, MD, [facility]  Electronically Signed    SCK/MedQ  DD: 11/19/2006  DT: 11/19/2006  Job #: [id number]

## 2010-12-26 NOTE — Cardiovascular Report (Signed)
   NAME:  Sean Riley, Sean Riley NO.:  [id number]   MEDICAL RECORD NO.:  [id number]                   PATIENT TYPE:  OIB   LOCATION:  2899                                 FACILITY:  [facility]   PHYSICIAN:  Salvadore Farber, M.D.             DATE OF BIRTH:  Oct 28, 1950   DATE OF PROCEDURE:  03/01/2003  DATE OF DISCHARGE:                              CARDIAC CATHETERIZATION   PROCEDURE:  Left heart catheterization, left ventriculography, coronary  angiography.   INDICATIONS:  The patient is a 60 year old gentleman with ongoing tobacco  abuse and dyslipidemia who presented to Duke Salvia, M.D. with  complaints of chest discomfort occurring at exertion.  He had prior  catheterization in 2001 which was reportedly normal.  An Adenosine  Cardiolite performed by Duke Salvia, M.D. suggested inferior ischemia.  He was therefore referred for diagnostic angiography.   PROCEDURAL TECHNIQUE:  Informed consent was obtained.  Under 1% lidocaine  local anesthesia a 6-French sheath was placed in the right femoral artery  using the modified Seldinger technique.  Diagnostic angiography and  ventriculography were performed using JL4, JR4, and pigtail catheters.  The  patient tolerated the procedure well and was transferred to the holding room  in stable condition.  Sheaths are to be removed there.   COMPLICATIONS:  None.   FINDINGS:  1. LV 112/8/16.  EF 65% without regional wall motion abnormalities.  2. No aortic stenosis or mitral regurgitation.  3. Left main:  Angiographically normal.  4. LAD:  Large vessel giving rise to a single large diagonal branch.  There     is mild aneurysmal dilation of the mid LAD, but no stenosis.  5. Circumflex:  Moderate sized vessel giving rise to two obtuse marginals.     It is angiographically normal.  6. RCA:  Large, dominant vessel with minor luminal irregularities.   IMPRESSION/RECOMMENDATIONS:  The patient has trivial coronary  disease.  False positive Cardiolite.  Suggest noncardiac etiology to his chest  discomfort.  Will continue preventative efforts.  Will add aspirin 81 mg  daily.                                               Salvadore Farber, M.D.    WED/MEDQ  D:  03/01/2003  T:  03/01/2003  Job:  [id number]  [name]. Alwyn Ren, M.D. [facility]   Duke Salvia, M.D.   cc:   [name]. Alwyn Ren, M.D. [facility]   Duke Salvia, M.D.

## 2010-12-26 NOTE — H&P (Signed)
NAME:  Sean Riley, KRUPKA NO.:  [id number]   MEDICAL RECORD NO.:  [id number]          PATIENT TYPE:  EMS   LOCATION:  MAJO                         FACILITY:  [facility]   PHYSICIAN:  Pricilla Riffle, M.D.    DATE OF BIRTH:  1951/06/05   DATE OF ADMISSION:  09/28/2005  DATE OF DISCHARGE:                                HISTORY & PHYSICAL   REASON FOR ADMISSION:  Sean Riley. Sean Riley, M.D. [facility].   PRIMARY CARDIOLOGIST:  Duke Salvia, M.D.   PATIENT PROFILE:  A 60 year old white male with prior history of atrial  tachycardia who presents with recurrent atrial tachycardia and chest pain.   PROBLEM LIST:  1.  Atrial tachycardia.      1.  On Flecainide x6 years.  2.  Nonobstructive coronary artery disease.      1.  July of 2004, Adenosine Myoview revealing inferolateral and          inferoseptal ischemia.      2.  July of 2004 cardiac catheterization revealing minor irregularities          throughout his coronary treated with normal LV function and EF of          65%.  3.  Gastroesophageal reflux disease.  4.  History of sleep apnea/chronic obstructive pulmonary disease.  5.  Ongoing tobacco abuse.  6.  Ongoing EtOH abuse.  7.  History of fatigue with beta blocker usage.  8.  History of ruptured disk status post spinal fusion.  9.  Chronic back pain.  10. Status post bilateral knee surgeries.  11. Status post shoulder surgery.  12. History of tracheostomy at age 31 for croup.   HISTORY OF PRESENT ILLNESS:  A 60 year old white male with a history of  paroxysmal atrial tachycardia treated with Flecainide since 2001.  Over that  period of time, he had done well without any symptomatic Sean until Friday,  February 16, when he began to have intermittent episodes of tachycardia  associated with lightheadedness and chest pain lasting between 3 and 5  minutes and resolving spontaneously.  Symptoms could occur at rest or with  exertion.  Notably, for the first time in a while, he  had a cup of coffee  Saturday morning and a cup of coffee Sunday morning.  Typically, he has four  caffeinated drinks a day (usually Dr. Reino Riley) and also drinks between 2 and  6 beers a day as well as smoking two packs of cigarettes a day.  This  morning, at approximately 4 o'clock he woke up with tachy palpitations and  chest pain, but was able to fall back to sleep fairly rapidly.  He then got  up at 6:30 a.m., was getting ready for work, and had recurrent tachy  palpitations with lightheadedness and chest pain.  He took a Flecainide  tablet and while driving to work secondary to persistence of tachy  palpitations, decided to turn back around, go home, and have his wife drive  him to the ED.  His wife drove him here at about 8 a.m. and his symptoms  abated spontaneously  without any medication.  Whereas his symptoms were  lasting between 3-5 minutes over the weekend, this morning it lasted about  an hour and a half.  He is currently asymptomatic and in sinus rhythm, but  having short bursts of Sean between 5-10 beats.  Of note, the patient was  started on prednisone 20 mg b.i.d. for a 10-day course by Dr. Alwyn Riley last  Wednesday, February 14, for low back pain.  He is also given a prescription  for Ultram and Flexeril at that time.  2 days later is when his symptoms  started.   ALLERGIES:  PERCODAN, VICODIN, NSAIDS, CELEBREX.   HOME MEDICATIONS:  1.  Flecainide 100 mg b.i.d.  2.  Diltiazem 180 mg daily.  3.  Nexium 40 mg daily.  4.  Prednisone 20 mg b.i.d. started on February 14 and stopped on February      18.  5.  Ultram and Flexeril, again started on February 14 and stopped on      February 18.   FAMILY HISTORY:  His mother died of hypertension and what was felt to be an  aneurysm at age 27.  His father died with a history of hypertension.   SOCIAL HISTORY:  He lives in [place], [place], with his wife.  He  works as an Mining engineer at [employer].  He is currently  smoking  two packs a day for the past year and a half after quitting for a short  period in the early 2000's.  Overall he has smoked between 30-60 pack years.  He drinking anywhere between 2-6 beers a day, but notes that he doesn't  keep count.  He does not use any drugs.  He does drink about four Dr.  Pat Riley a day and also had coffee this weekend.  He makes no effort to  watch caffeine in his diet.   REVIEW OF SYSTEMS:  Positive for palpitations, chest pain, presyncope, lower  back pain.  All other systems reviewed and negative.   PHYSICAL EXAMINATION:  VITAL SIGNS:  Temperature 97.1, heart rate 96,  respirations 20, blood pressure 118/88, pulse oximetry 97% on room air.  GENERAL:  Pleasant white male in no acute distress.  Awake, alert, and  oriented x3.  NECK:  Normal carotid upstrokes.  No bruits or JVD.  LUNGS:  Respirations clear and unlabored.  Clear to auscultation.  HEART:  Regular S1 and S2.  No S3, S4, or murmurs.  Occasional extra beat.  ABDOMEN:  Round, soft, nontender, and nondistended.  Bowel sounds present  x4.  EXTREMITIES:  Warm, dry, and pink.  No cyanosis, clubbing, or edema.  Dorsalis pedis and posterior tibial pulses are 2+ and equal bilaterally.   Chest x-ray from today shows no acute cardiopulmonary process.   EKG shows sinus rhythm with a rate of 84 beats per minute with incomplete  right bundle branch block.  There is no acute ST-T changes.   LABORATORY DATA:  Hemoglobin 16.7, hematocrit 47.2, WBC 13.0, platelets 350,  sodium 137, potassium 3.6, chloride 105, CO2 25.8, BUN 10, creatinine 0.7,  glucose 93.  CK-MB less than 1.0, troponin less than 0.05.  Calcium 8.8,  magnesium 2.2, PTT 26, PT 12.7, and INR 0.9.   ASSESSMENT:  1.  Paroxysmal atrial tachycardia.  He has had intermittent symptoms since      Friday with episodes this a.m. lasting approximately 1-1/2 hours.  Still     having short runs of atrial tachycardia which are asymptomatic.  He is       compliant with his Flecainide at home, but unfortunately noncompliant      with lifestyle modification, still smoking, drinking, and using      caffeine.  Plan to admit to telemetry to see if he has any recurrent      sustained symptomatic Sean.  Question if the patient is a flecainide      failure and we will discuss his case with electrophysiology/Dr. Graciela Husbands.      It seems unlikely that prednisone, Ultram, or Flexeril are significantly      contributing to the recurrence of his symptoms.  2.  Tobacco abuse/EtOH abuse.  Cessation advised and we will have the      smoking cessation nurse see him.  We will also put him on DT      prophylaxis.  3.  Hypokalemia.  We will replete his potassium.  4.  Chronic low back pain.  Continue pain medicines.      Ok Anis, NP    ______________________________  Pricilla Riffle, M.D.    CRB/MEDQ  D:  09/28/2005  T:  09/28/2005  Job:  [id number]

## 2010-12-26 NOTE — Discharge Summary (Signed)
[facility]  Patient:    Sean Riley, Sean Riley                     MRN: [id number] Adm. Date:  [id number] Disc. Date: [id number] Attending:  Dolores Patty                           Discharge Summary  FINAL DIAGNOSIS:  Atrial tachyarrhythmias with near syncope.  DISCHARGE MEDICATIONS: 1. Flecainide 100 mg b.i.d. 2. Toprol XL 100 mg daily. 3. Diltiazem CD 100 mg daily.  HISTORY OF PRESENT ILLNESS:  The patient is a 60 year old white gentleman who, for the past four to five days, has had intermittent bouts of palpitations with tachycardia and near syncope.  The patient symptoms have become more progressive and prolonged, prompting a visit to the office, where he was noted to have frequent PACs and heart rates up to 150.  The patient subsequently was admitted to the hospital for further evaluation and treatment of his atrial tachyarrhythmias.  PAST MEDICAL HISTORY:  He was hospitalized at [facility] in April 2001, and did have a cardiac catheterization.  It was felt that the patients atrial tachyarrhythmias have been exacerbated by high caffeine use and tobacco abuse.  His heart catheterization was essentially normal.  The patient had been discharged on Lopressor, which he had been taking infrequently, and caffeine and nicotine restriction had been largely unsuccessful.  LABORATORY DATA AND HOSPITAL COURSE:  The patient was admitted to a telemetry unit where he was monitored during his entire hospital stay.  During this period of time he continued to have frequent PACs and episodes of tachyarrhythmias and occasional runs of atrial flutter.  The patient was seen in consultation by cardiology and medications adjusted.  At the time of discharge, he continued to have frequent PACs but was asymptomatic on a regimen of flecainide, Lopressor, and Cardizem.  Laboratory studies included thyroid function studies that were normal.  White  count 9.4, H&H normal. Chemistries were unremarkable.  Cardiac enzymes were negative.  TSH was 0.985. Chest x-ray revealed the heart size to be upper limits of normal.  A 2-D echocardiogram revealed mild tricuspid regurgitation, mild mitral regurgitation, normal LV dimensions, and normal overall LV systolic function. Electrocardiogram revealed normal sinus rhythm with frequent PACs.  There was an RSR prime pattern in V1, otherwise unremarkable.  DISCHARGE INSTRUCTIONS:  The patient was discharged on the medical regimen listed above.  His caffeine and nicotine use again were discussed in the hospital.  The patient is scheduled for a follow-up exercise stress test next week.  Depending on the patients clinical status, will consider further evaluation in the future.  CONDITION ON DISCHARGE:  Improved. DD:  03/12/00 TD:  03/14/00 Job: [id number]

## 2010-12-26 NOTE — H&P (Signed)
[facility]  Patient:    Sean Riley                      MRN: [id number] Adm. Date:  [id number] Attending:  Dolores Patty                         History and Physical  HISTORY OF PRESENT ILLNESS:  Mr. Mark is a 60 year old white male admitted with paroxysmal supraventricular tachycardia with near syncope.  Over the last four to five days, he has had intermittent bouts of tachycardia with near syncope.  He denies any significant chest pain, associated diaphoresis, or nausea.  The symptoms have occurred without exertion.  The symptoms have become more progressive and prolonged, prompting the office visit.  In the office, he was found to have frequent PACs with heart rates up to 150.  Arrangements were made for hospitalization.  As he arose from the table, he had almost frank syncope prompting transport by EMS.  PAST MEDICAL HISTORY:  Tracheostomy at age 64 for croup.  He has had two knee surgeries.  He has also had appendectomy and nasal surgery to repair a fracture.  He has also had disk surgery and spinal fusion.  He has had surgery in the right shoulder.  He was hospitalized at [facility] in April of this year for cardiac catheterization.  He has been evaluated by Peter M. Swaziland, M.D., who felt that he has had PAT exacerbated by high caffeine use and tobacco abuse.  There were only minor irregularities found on that catheterization according to Dr. Swaziland.  He had previously been on Lopressor, aspirin, and Zocor, but had stopped these. Dr. Swaziland had written me on January 23, 2000, stating that Mr. Murrell care should be preventative.  He was concerned because Mr. Rudy had no expressed any interest in modifying his lifestyle.  He had recommended using p.r.n. Lopressor for the tachycardia.  Caffeine restriction and smoking cessation were encouraged.  FAMILY HISTORY:  Cancer of unknown primary of paternal grandmother.   Breast cancer and bypass surgery in maternal grandmother.  His mother had hypertension and a questionable CNS aneurysm and died at 6 with sudden death. His father had hypertension.  There is a history of intra-abdominal aneurysm in his maternal aunts and uncles.  MEDICATIONS: 1. Celebrex 200 mg daily as needed. 2. Allegra 180 mg daily. 3. Prevacid 30 mg daily.  ALLERGIES:  He is intolerant or allergic to DEMEROL, CODEINE, and VICODIN.  SOCIAL HISTORY:  He smokes two packs per day and drinks three to six beers per day "to rest."  He does not engage in a cardiovascular exercise program, but has been active, he feels, climbing stairs to repair elevators.  REVIEW OF SYSTEMS:  Positive for sinus congestion for which he sees ______ allergist.  This is mainly aggravated by pollution exposures.  He has a previous history of hematuria and had a negative cystoscopy in 1998.  He is followed by R. Valma Cava, M.D., an orthopedist.  He recently has seen Barry Dienes. Eloise Harman, M.D., who felt that he had reflux. At that time, apparently he may have been switched from the Prevacid to Aciphex.  Outpatient endoscopy was recommended at the patients convenience. Smoking cessation was also encouraged.  PHYSICAL EXAMINATION:  At the time of admission, the patient appeared weak and somewhat mildly ill.  He was somewhat pale.  VITAL SIGNS:  His resting pulse varied from  110-125.  His respiratory rate was 26.  The blood pressure was 116/72.  HEENT:  The fundal exam was unremarkable.  The otolaryngologic exam was unremarkable with no significant pathology.  NECK:  The thyroid was normal to palpation.  LUNGS:  He had minor rhonchi at the bases.  HEART:  He exhibited a tachycardia, which was irregular with frequent prematures with a flow murmur.  ABDOMEN:  No masses, tenderness, or organomegaly.  He had no lymphadenopathy.  GENITOURINARY:  Exam was initially deferred.  This was completed on January 30, 2000, and revealed prostatic hypertrophy.  He also was found to have a granuloma in the left scrotal area.  BACK:  An operative scar was found in the right lower quadrant in the lumbosacral area.  NEUROLOGIC:  He has no localizing neurologic signs.  There is no neuropsychiatric deficit, although there was a question as to the patients insight in view of continuing the smoking and caffeine with the history of PAT.  PLAN:  He will be admitted to telemetry with cardiac enzymes.  Extensive tyroid function tests were also be performed.  He will receive IV Cardizem for persistent supraventricular tachycardia.  Nathen May, M.D. F.A.C.C., will be consulted for possible electrophysiologic studies. DD:  03/10/00 TD:  03/10/00 Job: [id number]

## 2010-12-26 NOTE — H&P (Signed)
[facility]  Patient:    Sean Riley                      MRN: [id number] Adm. Date:  [id number] Attending:  Dolores Patty                         History and Physical  There was no dictation for this report. DD:  03/10/00 TD:  03/10/00 Job: [id number]

## 2010-12-26 NOTE — Discharge Summary (Signed)
Sean [name]   MEDICAL RECORD NO.:  [id number]          PATIENT TYPE:  INP   LOCATION:  4706                         FACILITY:  [facility]   PHYSICIAN:  Pricilla Riffle, M.D.    DATE OF BIRTH:  March 25, 1951   DATE OF ADMISSION:  09/28/2005  DATE OF DISCHARGE:  09/29/2005                                 DISCHARGE SUMMARY   ALLERGIES:  PERCODAN, VICODIN, NSAIDS and CELEBREX.   PRINCIPAL DIAGNOSES:  1.  Admitted with recurrent palpations in patient with history of paroxysmal      atrial tachycardia.  2.  Possible failing of flecainide therapy, flecainide increased from 100 mg      twice daily to 150 mg twice daily.  3.  Cardiac enzymes on admission:  CK was 27, CK-MB 1.3, troponin I study      0.03.  4.  The patient is to have an outpatient exercise treadmill study to monitor      proarrhythmic possibilities with a new dose of flecainide.  5.  Flecainide trough study is pending.   SECONDARY DIAGNOSES:  1.  History of atrial tachycardia, most probably left-sided.  2.  Current tobacco habituation with 2-pack-per-day habit/chronic      obstructive pulmonary disease.  3.  Excessive caffeine intake -- 4 caffeinated drinks daily.  4.  Ethanol use -- 2-6 beers daily.  5.  Left heart catheterization, 2001, normal left ventricular function, only      minor irregularities in the coronary arteries.  6.  Obstructive sleep apnea.  7.  Gastroesophageal reflux disease.  Myoview study, July 2004:  Ejection      fraction 56%.  8.  Status post bilateral knee surgery.  9.  History of shoulder surgery.  10. Tracheostomy, age 88, with pertussis.  11. History of herniated disk.  12. Status post spinal fusion.   PROCEDURES:  No procedures this admission.  Flecainide dose was increased  from 100 mg twice daily to 150 mg twice daily.   BRIEF HISTORY:  Sean Riley is a 60 year old male who has a history of PAT,  treated with flecainide.  He has been treated  since 2001.  Over that period,  he has done fairly well without any symptomatic tachycardia until Friday,  September 25, 2005.  He finds that these spells are intermittent, lasting 3-5  minutes.  He has tachy-palpitation accompanied by chest pain.  He notes that  over the last 3 days, tachy-palpitation has become more frequent and more  prolonged and presents to the emergency room at [facility].   HOSPITAL COURSE:  The patient presented to the emergency room, September 28, 2005, with increasing frequency and duration of episodes of PAT.  He is  symptomatic, in that he feels palpitation and chest pain.  He was admitted  and enzymes cycled;  first cardiac enzyme was troponin I 0.03.  He is not  having chest pain after a 16-hour stay here.  He was maintained on his  current medications since admission, seen by Dr. Graciela Husbands the morning of  September 29, 2005.  There was a  feeling that with telemetry showing sinus  rhythm with frequent PACs, Dr. Graciela Husbands advanced flecainide from 100 mg twice  daily to 150 mg twice daily with followup exercise treadmill study.   DISCHARGE MEDICATIONS:  The patient discharges on the following medications:  1.  For pain, Ultram 50 mg one to two tabs every 4-6 hours as needed.  2.  Flecainide 150 mg twice daily; that is a new dose.  3.  Diltiazem 180 mg daily.  4.  Enteric-coated aspirin 81 mg daily.  5.  Nexium 40 mg daily.  6.  Flexeril 10 mg daily.  7.  Chantix 0.5-mg tablets and 1.0-mg tablets; he is to take these after      drinking a glass of water with a set target date for quitting smoking.      He is to take 0.5 mg daily for 3 days, then 0.5 mg twice daily for 4      days, then 1 mg twice daily and to treat for the next 12 weeks.   FOLLOWUP:  Once again, follow up with [facility].  Exercise  treadmill study will be arranged.   LABORATORY STUDIES:  Complete blood count, day of discharge, September 29, 2005:  Hemoglobin 16.1, hematocrit 46.8, white  cell count 11.6, platelets  337,000.  Serum electrolytes, day of discharge:  Sodium 135, potassium 4.3,  chloride 100, bicarbonate 25, BUN is 16, creatinine 1, glucose 96.  PT 12.2,  INR 0.9 and PTT was 29.  Alkaline phosphatase is 77, SGOT is 62, SGPT is 99.  Total cholesterol 207, LDL cholesterol 131, HDL cholesterol 41 and  triglycerides of 174.  Flecainide trough study is pending.  TSH at 1.356.      Sean Riley, P.A.    ______________________________  Pricilla Riffle, M.D.    GM/MEDQ  D:  09/29/2005  T:  09/30/2005  Job:  [id number]   cc:   [name]. Alwyn Ren, M.D. [facility]  [phone]  [place] [address]   Duke Salvia, M.D.  [address]  [address]  [place]  [place] [address]

## 2010-12-26 NOTE — Discharge Summary (Signed)
[name], Sean [name]   MEDICAL RECORD NO.:  [id number]          PATIENT TYPE:  INP   LOCATION:  4706                         FACILITY:  [facility]   PHYSICIAN:  Pricilla Riffle, M.D.    DATE OF BIRTH:  13-Jul-1951   DATE OF ADMISSION:  09/28/2005  DATE OF DISCHARGE:  09/29/2005                                 DISCHARGE SUMMARY   ADDENDUM:  This addendum covers the exercise treadmill study planned from  Friday, October 09, 2005, at 10 o'clock in the morning.  The patient will walk  to target heart rate, which in his case is 132 beats per minute, or less.  We will observe the EKG for ventricular dysrhythmia.  The patient is on  flecainide 150 mg twice daily; this is a new dose for him started on  September 29, 2005.      Maple Mirza, P.A.    ______________________________  Pricilla Riffle, M.D.    GM/MEDQ  D:  09/29/2005  T:  09/30/2005  Job:  [id number]   cc:   Duke Salvia, M.D.  [address]  [address]  [place]  [place] [address]

## 2010-12-26 NOTE — Assessment & Plan Note (Signed)
[facility]                        [place] OFFICE NOTE   NAME:Sean Riley, Sean Riley                     MRN:          [id number]  DATE:11/12/2006                            DOB:          June 16, 1951    Mr. Meininger was seen for a complete physical examination November 12, 2006.   There is no change in his past medical history.  He has had  catheterizations on 2 occasions for chest pain; both negative.  One was  completed in [place] and the second at [facility].  He has also  had a appendectomy, ruptured disk, spinal effusion, bilateral knee  surgery and shoulder surgery.  At age 60 he had tracheotomy for croup.  Colonoscopy was negative in September 2004.  He has also been  hospitalized in 2007 with PAT with chest pain.  In March of that year he  had a myelogram.  He has been diagnosed with focal atrial fibrillation.  Other diagnoses include: esophageal reflux, sleep apnea and dyslipidemia  with elevated homocystine level.   There is no family history of diabetes, cancer or stroke.  His  grandmother had heart disease.   Continues to smoke 2 packs per day.  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  He will have 5-6 light beers per night.   He is presently on Nexium 40 mg daily & Cartia XT 180 mg daily.   He is intolerant or allergic to NSAIDs, Celebrex, Keflex, Percodan,  Zocor, Codeine.  He previously was prescribed Crestor, but stopped them  for no particular reason.  He would take them for a month and then  stop.  He says I just hate pills.   REVIEW OF SYSTEMS:  Essentially negative, except for a cough with  occasional sputum production.   He denies any polyuria, polydipsia or polyphagia.  He has no nonhealing  skin lesions.  He has no paresthesias.   He has experienced erectile dysfunction for approximately one year.  There is no loss of strength or libido.   He is 6 foot 1-1/2 inches, weight is up 7-1/2 pounds to  194.  Pulse was  60 and regular, respirations 18 and blood pressure 110/82.  He has mild  arteriolar narrowing.  There is no scleral icterus.  There is marked  erythremia of the posterior pharynx, which is probably related to the  reflux & smoking.  The right nare is boggy.  Thyroid is normal to palpation. He has mild scattered rhonchi.  An S4 is noted.  All pulses are intact and there are no bruits.  No  edema is present.  There is no aortic aneurysm.  There is no lymphadenopathy or organomegaly.  The prostate is enlarged on the left and firm without nodularity.  Hemoccult test was negative.  Musculoskeletal exam reveals mild crepitus of the knees.  Neuropsychiatric exam is unremarkable, except for his lack of insight  with relationship to the risks he is taking.   Chest x-ray reveals chronic obstructive pulmonary disease;  EKG  incomplete right bundle branch block.   Lipids were reviewed with him.  Based  on the NMR and his smoking and  present blood pressure, his risk is at least 25% for premature  cardiovascular event.  This was discussed frankly with him.   A trial of Viagra will be initiated.  I explained that erectile  dysfunction is most likely related to the lipids and the smoking; a  diabetic component will be evaluated as well.   He will be offered generic pravastatin which would cost 4 dollars out of  pocket with a prescription at [facility] or [facility].   This was dictated in front of Eddie to reinforce the risks which have  not been addressed despite his insistance on having a physical annually.     [name]. Alwyn Ren, MD,FACP,FCCP  Electronically Signed    [facility]/MedQ  DD: 11/12/2006  DT: 11/12/2006  Job #: [id number]

## 2011-04-20 ENCOUNTER — Encounter: Payer: Self-pay | Admitting: Internal Medicine

## 2011-04-20 ENCOUNTER — Ambulatory Visit (INDEPENDENT_AMBULATORY_CARE_PROVIDER_SITE_OTHER): Payer: BC Managed Care – PPO | Admitting: Internal Medicine

## 2011-04-20 DIAGNOSIS — I8393 Asymptomatic varicose veins of bilateral lower extremities: Secondary | ICD-10-CM

## 2011-04-20 DIAGNOSIS — K219 Gastro-esophageal reflux disease without esophagitis: Secondary | ICD-10-CM

## 2011-04-20 DIAGNOSIS — I8390 Asymptomatic varicose veins of unspecified lower extremity: Secondary | ICD-10-CM

## 2011-04-20 DIAGNOSIS — E785 Hyperlipidemia, unspecified: Secondary | ICD-10-CM

## 2011-04-20 DIAGNOSIS — N400 Enlarged prostate without lower urinary tract symptoms: Secondary | ICD-10-CM

## 2011-04-20 DIAGNOSIS — I4891 Unspecified atrial fibrillation: Secondary | ICD-10-CM

## 2011-04-20 DIAGNOSIS — Z0000 Encounter for general adult medical examination without abnormal findings: Secondary | ICD-10-CM

## 2011-04-20 LAB — HEPATIC FUNCTION PANEL
ALT: 27 U/L (ref 0–53)
AST: 24 U/L (ref 0–37)
Albumin: 4.2 g/dL (ref 3.5–5.2)
Total Bilirubin: 0.9 mg/dL (ref 0.3–1.2)
Total Protein: 7.1 g/dL (ref 6.0–8.3)

## 2011-04-20 LAB — LIPID PANEL
Cholesterol: 236 mg/dL — ABNORMAL HIGH (ref 0–200)
HDL: 58.6 mg/dL (ref >39.00)
Triglycerides: 124 mg/dL (ref 0.0–149.0)
VLDL: 24.8 mg/dL (ref 0.0–40.0)

## 2011-04-20 LAB — CBC WITH DIFFERENTIAL/PLATELET
Basophils Absolute: 0 10*3/uL (ref 0.0–0.1)
Hemoglobin: 16.5 g/dL (ref 13.0–17.0)
Lymphocytes Relative: 13.6 % (ref 12.0–46.0)
Monocytes Relative: 7.7 % (ref 3.0–12.0)
Neutro Abs: 5.7 10*3/uL (ref 1.4–7.7)
Neutrophils Relative %: 77.6 % — ABNORMAL HIGH (ref 43.0–77.0)
RDW: 13.6 % (ref 11.5–14.6)

## 2011-04-20 LAB — BASIC METABOLIC PANEL
Calcium: 9.2 mg/dL (ref 8.4–10.5)
Creatinine, Ser: 0.7 mg/dL (ref 0.4–1.5)
GFR: 114.13 mL/min (ref >60.00)
Glucose, Bld: 102 mg/dL — ABNORMAL HIGH (ref 70–99)
Sodium: 134 mEq/L — ABNORMAL LOW (ref 135–145)

## 2011-04-20 LAB — LDL CHOLESTEROL, DIRECT: Direct LDL: 160.9 mg/dL

## 2011-04-20 LAB — PSA: PSA: 0.42 ng/mL (ref 0.10–4.00)

## 2011-04-20 MED ORDER — SILDENAFIL CITRATE 100 MG PO TABS: 100.0000 mg | ORAL_TABLET | Freq: Every day | ORAL | Status: DC | PRN

## 2011-04-20 MED ORDER — ROSUVASTATIN CALCIUM 20 MG PO TABS: 20.0000 mg | ORAL_TABLET | ORAL | Status: DC

## 2011-04-20 NOTE — Patient Instructions (Signed)
Preventive Health Care: Exercise at least 30-45 minutes a day,  3-4 days a week.  Eat a low-fat diet with lots of fruits and vegetables, up to 7-9 servings per day. Consume less than 40 grams of sugar per day from foods & drinks with High Fructose Corn Sugar as # 1,2,3 or # 4 on label. Alcohol If you drink, do it moderately,less than 9 drinks per week, preferably less than 6 @ most. Health Care Power of Attorney & Living Will. Complete if not in place ; these place you in charge of your health care decisions. Please think about quitting smoking. Review the risks we discussed. Please call 1-800-QUIT-NOW 769-768-4042) for free smoking cessation counseling.  Consider  [facility]'s smoking cessation program @ [email or web address] or 8051463347.

## 2011-04-20 NOTE — Progress Notes (Signed)
Subjective:    Patient ID: Sean Riley, male    DOB: 15-Jul-1951, 60 y.o.   MRN: [id number]  HPI  Sean Riley is here for a physical;acute issues include chronic LBP with LLE radicular pain. He is unable to sleep on his back. Past history includes lumbar laminectomy and spinal fusion. Neither of the surgeons are still doing back surgery. He is concerned that he may have spurs in this area.      Review of Systems Patient reports no  vision/ hearing changes,anorexia, weight change, fever ,adenopathy, persistant / recurrent hoarseness, swallowing issues, chest pain,palpitations, edema,persistant / recurrent cough, hemoptysis, dyspnea(rest, exertional, paroxysmal nocturnal), gastrointestinal  bleeding (melena, rectal bleeding), abdominal pain, excessive heart burn, GU symptoms( dysuria, hematuria, pyuria, voiding/incontinence  issues) syncope, focal weakness, memory loss,numbness & tingling, skin/hair/nail changes,depression, anxiety, or  abnormal bruising/bleeding . 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 Alert, appropriate and cooperative throughout exam. Head: Normocephalic without obvious abnormalities;  no alopecia  Eyes: No corneal or conjunctival inflammation noted. Pupils equal round reactive to light and accommodation. Fundal exam is benign without hemorrhages, exudate, papilledema. Extraocular motion intact. Vision grossly normal with lenses. Ears: External  ear exam reveals no significant lesions or deformities. Canals clear .TMs normal. Hearing is grossly normal bilaterally. Nose: External nasal exam reveals no deformity or inflammation. Nasal mucosa are pink and moist. No lesions or exudates noted. Septum  deviated  Mouth: Oral mucosa and oropharynx reveal no lesions or exudates. Teeth in good repair. Neck: No deformities, masses, or tenderness noted.Thyroid normal. ROM normal. Lungs:  Normal respiratory effort; chest expands symmetrically. Lungs are clear to auscultation without rales, wheezes, or increased work of breathing. Heart: Normal rate and rhythm. Normal S1 and S2. No gallop, click, or rub. S4 ; no  murmur. Abdomen: Bowel sounds normal; abdomen soft and nontender. No masses, organomegaly or hernias noted. Genitalia/DRE:  Varicocele noted on the left. There is asymmetric prostatic hypertrophy. The left lobe is 2+ and the right 1.25-1.5 in size. No definite nodularity is present. .                                                                                   Musculoskeletal/extremities: Asymmetry of upper back muscles noted . No clubbing, cyanosis, edema, or deformity noted. Range of motion  normal .Tone & strength  normal.Joints normal. Nail health  good. He is able to lie back onto the exam table and sit up without help. Straight leg raising is negative. Vascular: Carotid, radial artery, dorsalis pedis and  posterior tibial pulses are full and equal. No bruits present. Varicose veins with some stasis hyperpigmentation is present, especially left lower extremity. Neurologic: Alert and oriented x3. Deep tendon reflexes symmetrical and normal. Gait is normal, including toe and heel walking.          Skin: Intact without suspicious lesions or rashes. Lymph: No cervical, axillary, or inguinal lymphadenopathy present. Psych: Mood and affect are normal. Normally interactive  Assessment & Plan:  #1 comprehensive physical exam; no acute findings #2 see Problem List with Assessments & Recommendations Plan: see Orders

## 2011-04-27 ENCOUNTER — Other Ambulatory Visit: Payer: Self-pay

## 2011-04-27 DIAGNOSIS — I83893 Varicose veins of bilateral lower extremities with other complications: Secondary | ICD-10-CM

## 2011-06-09 ENCOUNTER — Encounter: Payer: Self-pay | Admitting: Vascular Surgery

## 2011-06-11 ENCOUNTER — Telehealth: Payer: Self-pay | Admitting: Internal Medicine

## 2011-06-11 MED ORDER — ROSUVASTATIN CALCIUM 20 MG PO TABS: 20.0000 mg | ORAL_TABLET | ORAL | Status: DC

## 2011-06-11 NOTE — Telephone Encounter (Signed)
Patient needs refill crestor 20mg  - [facility] mail order

## 2011-06-11 NOTE — Telephone Encounter (Signed)
Done

## 2011-06-12 ENCOUNTER — Encounter: Payer: BC Managed Care – PPO | Admitting: Vascular Surgery

## 2011-06-12 ENCOUNTER — Other Ambulatory Visit: Payer: BC Managed Care – PPO

## 2011-06-12 ENCOUNTER — Encounter: Payer: Self-pay | Admitting: Vascular Surgery

## 2011-06-15 ENCOUNTER — Encounter: Payer: Self-pay | Admitting: Vascular Surgery

## 2011-06-15 ENCOUNTER — Ambulatory Visit (INDEPENDENT_AMBULATORY_CARE_PROVIDER_SITE_OTHER): Payer: BC Managed Care – PPO | Admitting: Vascular Surgery

## 2011-06-15 VITALS — BP 141/96 | HR 78 | Resp 18 | Ht 74.0 in | Wt 204.1 lb

## 2011-06-15 DIAGNOSIS — I83893 Varicose veins of bilateral lower extremities with other complications: Secondary | ICD-10-CM

## 2011-06-15 NOTE — Progress Notes (Signed)
Subjective:     Patient ID: Sean Riley, male   DOB: 1951/02/16, 60 y.o.   MRN: [id number]  HPI this 60 year old male patient was referred by Dr. Alwyn Ren for severe venous insufficiency left leg worse than right. Patient has no history of DVT, thrombophlebitis, bleeding. He does have a history of weeping of clear fluid left ankle worse than right and edema in both lower extremities with darkening of the skin in the lower third of the left leg. He has never had a true stasis ulcer. He has been having aching throbbing and burning discomfort which have worsened over the last few years particularly over the last several months left side worse than right. He does not wear elastic compression stockings. He cannot take ibuprofen because of reflux esophagitis. He does not elevate his legs or regular basis.  Past Medical History  Diagnosis Date  . Atrial fibrillation   . HYPERLIPIDEMIA   . 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 Microscopic hematuria   . NONSPECIFIC ABNORMAL ELECTROCARDIOGRAM   . SINUSITIS- ACUTE-NOS   . SMOKER   . GERD (gastroesophageal reflux disease)   . Varicose veins     History  Substance Use Topics  . Smoking status: Current Everyday Smoker -- 1.0 packs/day for 40 years  . Smokeless tobacco: Not on file  . Alcohol Use: 0.0 oz/week     4-6 can of beer daily    Family History  Problem Relation Age of Onset  . Hypertension Mother   . Hypertension Father   . Prostate cancer Father   . Coronary artery disease Maternal Grandmother     MI in 19s  . Arrhythmia Other   . Coronary artery disease Maternal Aunt     MI in 58s    Allergies  Allergen Reactions  . Cephalexin     REACTION: RASH  . Celecoxib     REACTION: GI UPSET  . Codeine     REACTION: GI UPSET  . Nsaids     REACTION: GI UPSET  . Simvastatin     REACTION: mental status changes    Current outpatient prescriptions:budesonide-formoterol (SYMBICORT) 160-4.5 MCG/ACT inhaler, Inhale 2  puffs into the lungs 2 (two) times daily.  , Disp: , Rfl: ;  diltiazem (CARDIZEM CD) 180 MG 24 hr capsule, Take 1 capsule (180 mg total) by mouth daily., Disp: 90 capsule, Rfl: 3;  esomeprazole (NEXIUM) 40 MG packet, Take 40 mg by mouth daily before breakfast.  , Disp: , Rfl:  flecainide (TAMBOCOR) 100 MG tablet, Take 1 tablet (100 mg total) by mouth 2 (two) times daily., Disp: 180 tablet, Rfl: 3;  rosuvastatin (CRESTOR) 20 MG tablet, Take 1 tablet (20 mg total) by mouth as directed. 1 tablet every wed. And sat., Disp: 30 tablet, Rfl: 3;  sildenafil (VIAGRA) 100 MG tablet, Take 1 tablet (100 mg total) by mouth daily as needed., Disp: 10 tablet, Rfl: 5  BP 141/96  Pulse 78  Resp 18  Ht 6\' 2"  (1.88 m)  Wt 204 lb 1.6 oz (92.579 kg)  BMI 26.20 kg/m2  Body mass index is 26.20 kg/(m^2).        Review of Systems he complains of leg discomfort with walking, history of atrial fibrillation, reflux esophagitis, lumbar disc disease previous spinal fusion, joint pain particularly in the knees and shoulders, all other systems are negative complete review of systems     Objective:   Physical Exam blood pressure 141/96 pulse 78 respirations 18  General well-developed well-nourished male no apparent stress alert and oriented x3 HEENT normal for age Lungs no rhonchi or wheezing Cardiovascular regular rhythm no murmurs carotid pulses 3+ no audible bruits Abdomen soft nontender no masses palpable Neurologic exam normal Musculoskeletal free of major deformities Lower extremity exam reveals 3+ femoral to posterior cells pedis and posterior tibial pulses palpable bilaterally. Skin there is hyperpigmentation in the lower third of the left leg particularly medially with some thickening of the skin 1+ edema. No active ulcers noted. There are multiple reticular veins in this area as well as some small varicose veins in the medial calf area of the great saphenous system. Right leg has 1+ edema with some small  varicosities along the great saphenous system medially. There is early hyperpigmentation on the right.   Today I ordered bilateral venous duplex scan of the lower extremities which I reviewed and interpreted. Left leg has gross reflux throughout the left great saphenous vein with no DVT. Right leg has similar situation with reflux in the right great saphenous system with no DVT. There is some reflux in both superficial femoral veins.    Assessment:    bilateral venous insufficiency with reflux both great saphenous veins with painful varicosities and venous hypertension with hyperpigmentation distally left worse than right    Plan:     #1 long-leg elastic compression stockings 20-30 mm gradient #2 elevation of legs as much as possible #3 analgesics-patient cannot take ibuprofen because of reflux esophagitis #4 return in 3 months if no improvement in skin condition or symptoms the patient should have one larger blush in the left great saphenous vein followed by laser ablation right great saphenous

## 2011-06-23 NOTE — Procedures (Unsigned)
LOWER EXTREMITY VENOUS REFLUX EXAM  INDICATION:  Varicose veins with lower extremity pain.  EXAM:  Using color-flow imaging and pulse Doppler spectral analysis, the bilateral common femoral, superficial femoral, popliteal, posterior tibial, greater and lesser saphenous veins are evaluated.  There is evidence suggesting deep venous insufficiency in the bilateral superficial femoral veins of the lower extremities.  The right saphenofemoral junction is competent. The left saphenofemoral junction is not competent with Reflux of >540milliseconds with the caliber as described below.  The bilateral GSV's are not competent with reflux >500 milliseconds with the calibers as described below.  The bilateral proximal short saphenous vein demonstrates competency.  GSV Diameter (used if found to be incompetent only)                                           Right    Left Proximal Greater Saphenous Vein           0.75 cm  1.16 cm Proximal-to-mid-thigh                     0.42 cm  0.61 cm Mid thigh                                 0.42 cm  0.99 cm Mid-distal thigh                          cm       cm Distal thigh                              0.46 cm  0.64 cm Knee                                      0.51 cm  0.60 cm  IMPRESSION: 1. The bilateral greater saphenous veins are not competent with reflux     >536milliseconds. 2. The bilateral greater saphenous veins are not tortuous. 3. The deep venous system bilaterally is not competent with reflux     >500 milliseconds in the bilateral superficial femoral veins. 4. The bilateral small saphenous veins are competent.       ___________________________________________ Quita Skye. Hart [place], M.D.  SH/MEDQ  D:  06/15/2011  T:  06/15/2011  Job:  [id number]

## 2011-09-14 ENCOUNTER — Encounter: Payer: Self-pay | Admitting: Vascular Surgery

## 2011-09-15 ENCOUNTER — Encounter: Payer: Self-pay | Admitting: Vascular Surgery

## 2011-09-15 ENCOUNTER — Ambulatory Visit (INDEPENDENT_AMBULATORY_CARE_PROVIDER_SITE_OTHER): Payer: BC Managed Care – PPO | Admitting: Vascular Surgery

## 2011-09-15 VITALS — BP 111/78 | HR 64 | Resp 16 | Ht 73.0 in | Wt 193.0 lb

## 2011-09-15 DIAGNOSIS — I83893 Varicose veins of bilateral lower extremities with other complications: Secondary | ICD-10-CM

## 2011-09-15 DIAGNOSIS — L97909 Non-pressure chronic ulcer of unspecified part of unspecified lower leg with unspecified severity: Secondary | ICD-10-CM | POA: Insufficient documentation

## 2011-09-15 NOTE — Progress Notes (Signed)
Subjective:     Patient ID: Sean Riley, male   DOB: 11-Jan-1951, 61 y.o.   MRN: [id number]  HPI this 61 year old male returns today for further followup regarding her severe venous insufficiency of both lower extremities he continues to have burning discomfort in both legs which worsens as the day progresses. He has a history of draining a clear serous fluid from both ankle areas medially with chronic edema and thickening of the skin. He has tried wearing long light elastic compression stockings 20-30 mm gradient as well as elevating his legs and ibuprofen and has not had improvement in his symptomatology. He has no history of DVT.  Past Medical History  Diagnosis Date  . Atrial fibrillation   . HYPERLIPIDEMIA   . 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 Microscopic hematuria   . NONSPECIFIC ABNORMAL ELECTROCARDIOGRAM   . SINUSITIS- ACUTE-NOS   . SMOKER   . GERD (gastroesophageal reflux disease)   . Varicose veins     History  Substance Use Topics  . Smoking status: Current Everyday Smoker -- 1.0 packs/day for 40 years  . Smokeless tobacco: Not on file  . Alcohol Use: 0.0 oz/week     4-6 can of beer daily    Family History  Problem Relation Age of Onset  . Hypertension Mother   . Hypertension Father   . Prostate cancer Father   . Coronary artery disease Maternal Grandmother     MI in 39s  . Arrhythmia Other   . Coronary artery disease Maternal Aunt     MI in 29s    Allergies  Allergen Reactions  . Cephalexin     REACTION: RASH  . Celecoxib     REACTION: GI UPSET  . Codeine     REACTION: GI UPSET  . Nsaids     REACTION: GI UPSET  . Simvastatin     REACTION: mental status changes    Current outpatient prescriptions:budesonide-formoterol (SYMBICORT) 160-4.5 MCG/ACT inhaler, Inhale 2 puffs into the lungs 2 (two) times daily.  , Disp: , Rfl: ;  diltiazem (CARDIZEM CD) 180 MG 24 hr capsule, Take 1 capsule (180 mg total) by mouth daily., Disp: 90 capsule,  Rfl: 3;  esomeprazole (NEXIUM) 40 MG packet, Take 40 mg by mouth daily before breakfast.  , Disp: , Rfl:  flecainide (TAMBOCOR) 100 MG tablet, Take 1 tablet (100 mg total) by mouth 2 (two) times daily., Disp: 180 tablet, Rfl: 3;  rosuvastatin (CRESTOR) 20 MG tablet, Take 1 tablet (20 mg total) by mouth as directed. 1 tablet every wed. And sat., Disp: 30 tablet, Rfl: 3;  sildenafil (VIAGRA) 100 MG tablet, Take 1 tablet (100 mg total) by mouth daily as needed., Disp: 10 tablet, Rfl: 5  BP 111/78  Pulse 64  Resp 16  Ht 6\' 1"  (1.854 m)  Wt 193 lb (87.544 kg)  BMI 25.46 kg/m2  Body mass index is 25.46 kg/(m^2).         Review of Systems denies chest pain, dyspnea on exertion, PND, orthopnea, hemoptysis or     Objective:   Physical Exam blood pressure 111/78 heart rate 64 respirations 16 General well-developed well-nourished male no apparent stress alert and oriented x3 Lower extremity exam reveals 3+ femoral and dorsalis pedis pulses palpable bilaterally. He has 1+ edema hyperpigmentation in the lower third of both legs left worse than right. He has reticular veins in the lower third of the leg but no bulging varicosities.  Assessment:     Bilateral venous insufficiency with documented gross reflux bilateral great saphenous veins with no DVT-symptoms not responding to conservative measures and continuing to affect his daily living    Plan:     Patient needs the following #1 laser ablation left great saphenous vein #2 laser ablation right great saphenous vein Will proceed with precertification to perform this in the near future

## 2011-09-29 ENCOUNTER — Other Ambulatory Visit: Payer: Self-pay | Admitting: *Deleted

## 2011-09-29 DIAGNOSIS — I83893 Varicose veins of bilateral lower extremities with other complications: Secondary | ICD-10-CM

## 2011-10-09 ENCOUNTER — Telehealth: Payer: Self-pay | Admitting: Internal Medicine

## 2011-10-09 NOTE — Telephone Encounter (Signed)
Copied from 04/20/2011 lab patient instruction : CK,Lipids, hepatic panel (272.4, 995.20).

## 2011-10-09 NOTE — Telephone Encounter (Signed)
Would like a lab appointment for a cholestorol check, if you will approve I will call her back to set up the lab appointment. Thanks Approved by patient to come in 3/11

## 2011-10-09 NOTE — Telephone Encounter (Signed)
Chrea,  Can you help with this?  Thanks

## 2011-10-15 ENCOUNTER — Other Ambulatory Visit: Payer: Self-pay | Admitting: Internal Medicine

## 2011-10-15 DIAGNOSIS — T887XXA Unspecified adverse effect of drug or medicament, initial encounter: Secondary | ICD-10-CM

## 2011-10-15 DIAGNOSIS — E785 Hyperlipidemia, unspecified: Secondary | ICD-10-CM

## 2011-10-19 ENCOUNTER — Other Ambulatory Visit (INDEPENDENT_AMBULATORY_CARE_PROVIDER_SITE_OTHER): Payer: BC Managed Care – PPO

## 2011-10-19 DIAGNOSIS — E785 Hyperlipidemia, unspecified: Secondary | ICD-10-CM

## 2011-10-19 DIAGNOSIS — T887XXA Unspecified adverse effect of drug or medicament, initial encounter: Secondary | ICD-10-CM

## 2011-10-19 LAB — LIPID PANEL: Total CHOL/HDL Ratio: 4

## 2011-10-19 LAB — HEPATIC FUNCTION PANEL
ALT: 20 U/L (ref 0–53)
AST: 20 U/L (ref 0–37)
Bilirubin, Direct: 0.1 mg/dL (ref 0.0–0.3)
Total Bilirubin: 0.3 mg/dL (ref 0.3–1.2)

## 2011-10-26 ENCOUNTER — Other Ambulatory Visit: Payer: BC Managed Care – PPO | Admitting: Vascular Surgery

## 2011-10-28 ENCOUNTER — Ambulatory Visit (INDEPENDENT_AMBULATORY_CARE_PROVIDER_SITE_OTHER): Payer: BC Managed Care – PPO | Admitting: Internal Medicine

## 2011-10-28 ENCOUNTER — Encounter: Payer: Self-pay | Admitting: Internal Medicine

## 2011-10-28 VITALS — BP 136/90 | HR 80 | Temp 98.5°F | Wt 192.6 lb

## 2011-10-28 DIAGNOSIS — B370 Candidal stomatitis: Secondary | ICD-10-CM

## 2011-10-28 DIAGNOSIS — J029 Acute pharyngitis, unspecified: Secondary | ICD-10-CM

## 2011-10-28 DIAGNOSIS — E785 Hyperlipidemia, unspecified: Secondary | ICD-10-CM

## 2011-10-28 DIAGNOSIS — R7301 Impaired fasting glucose: Secondary | ICD-10-CM

## 2011-10-28 MED ORDER — MAGIC MOUTHWASH: 5.0000 mL | Freq: Three times a day (TID) | ORAL | Status: DC

## 2011-10-28 NOTE — Progress Notes (Signed)
  Subjective:    Patient ID: Sean Riley, male    DOB: Jan 27, 1951, 61 y.o.   MRN: [id number]  HPI Respiratory tract infection Onset/symptoms: 10/22/11 as ST  Exposures (illness/environmental/extrinsic):grandson had Strep Progression of symptoms:worsening of ST Treatments/response:saline gargle w/o benefit Present symptoms: Fever/chills/sweats:chilling occasionally Frontal headache:no Facial pain:no Nasal purulence:white Sore throat: "very bad" Dental pain:no Lymphadenopathy:no Wheezing/shortness of breath:no Cough/sputum/hemoptysis:white  Associated extrinsic/allergic symptoms:itchy eyes/ sneezing:no Past medical history: Seasonal allergies: yes/asthma:supposedly Smoking history:1 ppd           Review of Systems  Labs were reviewed; LDL is not at goal of at least less than 120. His fasting blood sugar has been as high as 102. He denies polyuria, polydipsia, polyphagia.   He is not on oral steroids and does not use inhaled steroids. No FH DM       Objective:   Physical Exam General appearance:well nourished; no acute distress or increased work of breathing is present.  No  lymphadenopathy about the head, neck, or axilla noted.   Eyes: No conjunctival inflammation or lid edema is present.  Ears:  External ear exam shows no significant lesions or deformities.  Otoscopic examination reveals clear canals, tympanic membranes are intact bilaterally without bulging, retraction, inflammation or discharge.TMs dull especially on R  Nose:  External nasal examination shows no deformity or inflammation. Nasal mucosa are pink and moist without lesions or exudates. No septal dislocation or deviation.No obstruction to airflow.   Oral exam: Dental hygiene is good; lips and gums are healthy appearing.There is Candidal  oropharyngeal lesions  noted.   Neck:  No deformities,  masses, or tenderness noted.   Supple with full range of motion without pain.   Heart:  Normal rate and  regular rhythm. S1 and S2 normal without gallop, murmur, click, rub. S 4 with slurring Lungs:Chest clear to auscultation; no wheezes, rhonchi,rales ,or rubs present.No increased work of breathing.    Extremities:  No cyanosis, edema, or clubbing  noted    Skin: Warm & dry          Assessment & Plan:  #1 pahyngitis , Candda    #1 pharyngitis; history of exposure to strep. Strep test negative. Clinically appears to be Candidal.  #2 minimal fasting hyperglycemia  Plan: #1 zinc lozenges; magic mouthwash; A1 C.

## 2011-10-28 NOTE — Assessment & Plan Note (Signed)
His LDL is 135.9 Crestor 20 mg Wednesday and Saturday. As noted his LDL goal is at least less than 120 daily less than 90. Crestor should be increased to Monday, Wednesday, and Friday each week.

## 2011-10-28 NOTE — Patient Instructions (Signed)
Zicam Melts or Zinc lozenges ; vitamin C 2000 mg daily; & Echinacea for 4-7 days. Report fever, exudate("pus") or progressive pain.  

## 2011-10-29 LAB — CBC WITH DIFFERENTIAL/PLATELET
Hemoglobin: 16.4 g/dL (ref 13.0–17.0)
MCHC: 33.4 g/dL (ref 30.0–36.0)
MCV: 99.1 fl (ref 78.0–100.0)
RBC: 4.95 Mil/uL (ref 4.22–5.81)
WBC: 11.4 10*3/uL — ABNORMAL HIGH (ref 4.5–10.5)

## 2011-11-03 ENCOUNTER — Ambulatory Visit: Payer: BC Managed Care – PPO | Admitting: Vascular Surgery

## 2011-11-03 ENCOUNTER — Telehealth: Payer: Self-pay | Admitting: Internal Medicine

## 2011-11-03 NOTE — Telephone Encounter (Signed)
Please verify that he did receive Magic mouthwash to be used as 5 cc gargled  well 3 times a day & swallowed.# 90 cc

## 2011-11-03 NOTE — Telephone Encounter (Signed)
Caller: Garvin/Patient PCP: Marga Melnick; CB#: [phone]; Call regarding Sore Throat; follows OV last week, still has pain after using recommendations, afebrile.  Guideline: Sore Throat, using salt water gargles, appt advised to be seen within 24 hrs, advised no appt 3/26 0r 11/04/11. pt states prefers to be seen at [facility].

## 2011-11-03 NOTE — Telephone Encounter (Signed)
Left message to call office

## 2011-11-04 ENCOUNTER — Telehealth: Payer: Self-pay | Admitting: Internal Medicine

## 2011-11-04 NOTE — Telephone Encounter (Signed)
Opened in error

## 2011-11-05 ENCOUNTER — Encounter: Payer: Self-pay | Admitting: Vascular Surgery

## 2011-11-09 ENCOUNTER — Ambulatory Visit (INDEPENDENT_AMBULATORY_CARE_PROVIDER_SITE_OTHER): Payer: BC Managed Care – PPO | Admitting: Vascular Surgery

## 2011-11-09 ENCOUNTER — Encounter: Payer: Self-pay | Admitting: Vascular Surgery

## 2011-11-09 VITALS — BP 131/91 | HR 86 | Resp 20 | Ht 73.0 in | Wt 191.0 lb

## 2011-11-09 DIAGNOSIS — I83893 Varicose veins of bilateral lower extremities with other complications: Secondary | ICD-10-CM

## 2011-11-09 NOTE — Progress Notes (Signed)
Subjective:     Patient ID: Sean Riley, male   DOB: 09/09/1950, 61 y.o.   MRN: [id number]  HPI this 61 year old male had laser ablation of the left great saphenous vein performed from the knee to the saphenofemoral junction under local tumescent anesthesia. He has valvular incompetence with venous hypertension and a history of bilateral venous stasis ulcers.  Review of Systems     Objective:   Physical ExamBP 131/91  Pulse 86  Resp 20  Ht 6\' 1"  (1.854 m)  Wt 191 lb (86.637 kg)  BMI 25.20 kg/m2      Assessment:     Well-tolerated laser ablation left great saphenous vein under local tumescent anesthesia with 1987 J of energy utilized    Plan:     Return in one week for venous duplex exam to confirm closure left great saphenous vein Will then reschedule for similar procedure on the contralateral right leg

## 2011-11-09 NOTE — Progress Notes (Signed)
Laser Ablation Procedure      Date: 11/09/2011    Sean Riley DOB:[birth year]  Consent signed: Yes  Surgeon:J.D. Hart Rochester  Procedure: Laser Ablation: left Greater Saphenous Vein  BP 131/91  Pulse 86  Resp 20  Ht 6\' 1"  (1.854 m)  Wt 191 lb (86.637 kg)  BMI 25.20 kg/m2  Start time: 3:25   End time: 4:00  Tumescent Anesthesia: 225 cc 0.9% NaCl with 50 cc Lidocaine HCL with 1% Epi and 15 cc 8.4% NaHCO3  Local Anesthesia: 8 cc Lidocaine HCL and NaHCO3 (ratio 2:1)  Pulsed mode: Watts 15 Seconds 1 Pulses:1 Total Pulses:133 Total Energy: 1987 Total Time: 2:12   Patient tolerated procedure well: Yes  Description of Procedure:  After marking the course of the saphenous vein and the secondary varicosities in the standing position, the patient was placed on the operating table in the supine position, and the left leg was prepped and draped in sterile fashion. Local anesthetic was administered, and under ultrasound guidance the saphenous vein was accessed with a micro needle and guide wire; then the micro puncture sheath was placed. A guide wire was inserted to the saphenofemoral junction, followed by a 5 french sheath.  The position of the sheath and then the laser fiber below the junction was confirmed using the ultrasound and visualization of the aiming beam.  Tumescent anesthesia was administered along the course of the saphenous vein using ultrasound guidance. Protective laser glasses were placed on the patient, and the laser was fired at 15 watt pulsed mode advancing 1-2 mm per sec.  For a total of 1987 joules.  A steri strip was applied to the puncture site.   ABD pads and thigh high compression stockings were applied.  Ace wrap bandages were applied over the phlebectomy sites and at the top of the saphenofemoral junction.  Blood loss was less than 15 cc.  The patient ambulated out of the operating room having tolerated the procedure well.

## 2011-11-10 ENCOUNTER — Encounter: Payer: Self-pay | Admitting: Vascular Surgery

## 2011-11-10 ENCOUNTER — Telehealth: Payer: Self-pay | Admitting: *Deleted

## 2011-11-10 NOTE — Telephone Encounter (Signed)
Pt doing well. Following all instructions. Unable to take Ibuprofen tho. Using ice. Reminded him of his fu appt next week.

## 2011-11-16 ENCOUNTER — Encounter: Payer: Self-pay | Admitting: Vascular Surgery

## 2011-11-17 ENCOUNTER — Other Ambulatory Visit: Payer: Self-pay | Admitting: *Deleted

## 2011-11-17 ENCOUNTER — Encounter (INDEPENDENT_AMBULATORY_CARE_PROVIDER_SITE_OTHER): Payer: BC Managed Care – PPO | Admitting: *Deleted

## 2011-11-17 ENCOUNTER — Ambulatory Visit (INDEPENDENT_AMBULATORY_CARE_PROVIDER_SITE_OTHER): Payer: BC Managed Care – PPO | Admitting: Vascular Surgery

## 2011-11-17 ENCOUNTER — Encounter: Payer: Self-pay | Admitting: Vascular Surgery

## 2011-11-17 VITALS — BP 139/90 | HR 75 | Resp 16 | Ht 73.0 in | Wt 198.0 lb

## 2011-11-17 DIAGNOSIS — I83893 Varicose veins of bilateral lower extremities with other complications: Secondary | ICD-10-CM

## 2011-11-17 NOTE — Telephone Encounter (Signed)
Left message to call office

## 2011-11-17 NOTE — Progress Notes (Signed)
Subjective:     Patient ID: Sean Riley, male   DOB: July 07, 1951, 61 y.o.   MRN: [id number]  HPI a 61 year old male returns 1 week post laser ablation left great saphenous vein for valvular incompetence with reflux and a history of stasis ulcers. He states that the thigh has had some mild-to-moderate discomfort along the course of the great saphenous vein. He said no change in distal edema. He is wearing elastic compression stocking and elevating his leg as much as feasible.   Past Medical History  Diagnosis Date  . Atrial fibrillation   . HYPERLIPIDEMIA   . 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 Microscopic hematuria   . NONSPECIFIC ABNORMAL ELECTROCARDIOGRAM   . SINUSITIS- ACUTE-NOS   . SMOKER   . GERD (gastroesophageal reflux disease)   . Varicose veins     History  Substance Use Topics  . Smoking status: Current Everyday Smoker -- 1.0 packs/day for 40 years  . Smokeless tobacco: Not on file  . Alcohol Use: 0.0 oz/week     4-6 can of beer daily    Family History  Problem Relation Age of Onset  . Hypertension Mother   . Hypertension Father   . Prostate cancer Father   . Coronary artery disease Maternal Grandmother     MI in 90s  . Arrhythmia Other   . Coronary artery disease Maternal Aunt     MI in 40s    Allergies  Allergen Reactions  . Cephalexin     REACTION: RASH  . Celecoxib     REACTION: GI UPSET  . Codeine     REACTION: GI UPSET  . Nsaids     REACTION: GI UPSET  . Simvastatin     REACTION: mental status changes    Current outpatient prescriptions:Alum & Mag Hydroxide-Simeth (MAGIC MOUTHWASH) SOLN, Take 5 mLs by mouth 3 (three) times daily., Disp: 90 mL, Rfl: 0;  budesonide-formoterol (SYMBICORT) 160-4.5 MCG/ACT inhaler, Inhale 2 puffs into the lungs 2 (two) times daily.  , Disp: , Rfl: ;  diltiazem (CARDIZEM CD) 180 MG 24 hr capsule, Take 1 capsule (180 mg total) by mouth daily., Disp: 90 capsule, Rfl: 3 esomeprazole (NEXIUM) 40 MG packet,  Take 40 mg by mouth daily before breakfast.  , Disp: , Rfl: ;  flecainide (TAMBOCOR) 100 MG tablet, Take 1 tablet (100 mg total) by mouth 2 (two) times daily., Disp: 180 tablet, Rfl: 3;  rosuvastatin (CRESTOR) 20 MG tablet, Take 1 tablet (20 mg total) by mouth as directed. 1 tablet every wed. And sat., Disp: 30 tablet, Rfl: 3 sildenafil (VIAGRA) 100 MG tablet, Take 1 tablet (100 mg total) by mouth daily as needed., Disp: 10 tablet, Rfl: 5  BP 139/90  Pulse 75  Resp 16  Ht 6\' 1"  (1.854 m)  Wt 198 lb (89.812 kg)  BMI 26.12 kg/m2  Body mass index is 26.12 kg/(m^2).         Review of Systems denies chest pain, dyspnea on exertion, PND, orthopnea, hemoptysis, claudication.    Objective:   Physical Exam blood pressure 139/90 heart rate 75 respirations 16 General well-developed well-nourished male no apparent stress alert and oriented x3 Lower extremity exam left leg has 3+ femoral and dorsalis pedis pulse palpable. There is moderate discomfort along the course of the great saphenous vein from the saphenofemoral junction to the distal thigh. There is minimal distal edema.   today I ordered a venous duplex exam of the left leg  to the saphenofemoral junction.which are reviewed and interpreted. There is no DVT. Great saphenous vein on the left is totally closed and the distal thigh    Assessment:    successful   laser ablation of left great saphenous vein for venous hypertension and history stasis ulcer Plan:    plan laser ablation right great saphenous vein for similar problem in a few weeks

## 2011-12-01 NOTE — Procedures (Unsigned)
DUPLEX DEEP VENOUS EXAM - LOWER EXTREMITY  INDICATION:  Status post left great saphenous vein laser ablation.  HISTORY:  Edema:  No. Trauma/Surgery:  Left great saphenous vein laser ablation on 11/09/2011. Pain:  Left medial thigh tenderness. PE:  No. Previous DVT:  No. Anticoagulants: Other:  DUPLEX EXAM:               CFV   SFV   PopV  PTV    GSV               R  L  R  L  R  L  R   L  R  L Thrombosis    o  o     o     o      o     + Spontaneous   +  +     +     +      +     0 Phasic        +  +     +     +      +     0 Augmentation  +  +     +     +      +     0 Compressible  +  +     +     +      +     0 Competent     0  +     +     +      +     0  Legend:  + - yes  o - no  p - partial  D - decreased  IMPRESSION: 1. No evidence of deep vein thrombosis noted in the left lower     extremity. 2. The left great saphenous vein appears totally occluded from the     distal insertion site to near the saphenofemoral junction. 3. Reflux of >500 milliseconds noted in the right common femoral vein.    _____________________________ Quita Skye. Hart Rochester, M.D.  CH/MEDQ  D:  11/18/2011  T:  11/18/2011  Job:  [id number]

## 2011-12-10 ENCOUNTER — Encounter: Payer: Self-pay | Admitting: Vascular Surgery

## 2011-12-14 ENCOUNTER — Other Ambulatory Visit: Payer: Self-pay | Admitting: *Deleted

## 2011-12-14 ENCOUNTER — Encounter: Payer: Self-pay | Admitting: Vascular Surgery

## 2011-12-14 ENCOUNTER — Ambulatory Visit (INDEPENDENT_AMBULATORY_CARE_PROVIDER_SITE_OTHER): Payer: BC Managed Care – PPO | Admitting: Vascular Surgery

## 2011-12-14 VITALS — BP 142/86 | HR 88 | Resp 16 | Ht 73.0 in | Wt 189.0 lb

## 2011-12-14 DIAGNOSIS — I83893 Varicose veins of bilateral lower extremities with other complications: Secondary | ICD-10-CM

## 2011-12-14 NOTE — Progress Notes (Signed)
Laser Ablation Procedure      Date: 12/14/2011    Sean Riley DOB:1951-03-15  Consent signed: Yes  Surgeon:J.D. Hart Rochester  Procedure: Laser Ablation: right Greater Saphenous Vein  BP 142/86  Pulse 88  Resp 16  Ht 6\' 1"  (1.854 m)  Wt 189 lb (85.73 kg)  BMI 24.94 kg/m2  Start time: 3:10   End time: 3:40  Tumescent Anesthesia: 200 cc 0.9% NaCl with 50 cc Lidocaine HCL with 1% Epi and 15 cc 8.4% NaHCO3  Local Anesthesia: 7 cc Lidocaine HCL and NaHCO3 (ratio 2:1)  Pulsed mode: Watts 15 Seconds 1 Pulses:1 Total Pulses:164 Total Energy: 2446 Total Time: 2:43   Patient tolerated procedure well: Yes   Description of Procedure:  After marking the course of the saphenous vein and the secondary varicosities in the standing position, the patient was placed on the operating table in the supine position, and the right leg was prepped and draped in sterile fashion. Local anesthetic was administered, and under ultrasound guidance the saphenous vein was accessed with a micro needle and guide wire; then the micro puncture sheath was placed. A guide wire was inserted to the saphenofemoral junction, followed by a 5 french sheath.  The position of the sheath and then the laser fiber below the junction was confirmed using the ultrasound and visualization of the aiming beam.  Tumescent anesthesia was administered along the course of the saphenous vein using ultrasound guidance. Protective laser glasses were placed on the patient, and the laser was fired at 15 watt pulsed mode advancing 1-2 mm per sec.  For a total of 2446 joules.  A steri strip was applied to the puncture site.   ABD pads and thigh high compression stockings were applied.  Ace wrap bandages were applied over the phlebectomy sites and at the top of the saphenofemoral junction.  Blood loss was less than 15 cc.  The patient ambulated out of the operating room having tolerated the procedure well.

## 2011-12-14 NOTE — Progress Notes (Signed)
Subjective:     Patient ID: Sean Riley, male   DOB: Jun 08, 1951, 61 y.o.   MRN: [id number]  HPI this 61 year old male had laser ablation of the right great saphenous vein performed under local tumescent anesthesia. He tolerated the procedure well. He had a total of 2446 J of energy utilized.  Review of Systems     Objective:   Physical ExamBP 142/86  Pulse 88  Resp 16  Ht 6\' 1"  (1.854 m)  Wt 189 lb (85.73 kg)  BMI 24.94 kg/m2      Assessment:     Well-tolerated laser ablation right great saphenous vein performed under local tumescent anesthesia for painful varicosities secondary to valvular incompetence great saphenous system    Plan:     Return in one week for venous duplex exam to confirm closure right great saphenous vein  This will conclude his treatment regimen

## 2011-12-15 ENCOUNTER — Encounter: Payer: Self-pay | Admitting: Vascular Surgery

## 2011-12-15 ENCOUNTER — Telehealth: Payer: Self-pay | Admitting: *Deleted

## 2011-12-15 NOTE — Telephone Encounter (Signed)
Patient was asleep but wife said he is doping fine. Reminded her of instructions and his fu visit next week.

## 2011-12-18 ENCOUNTER — Encounter: Payer: Self-pay | Admitting: Vascular Surgery

## 2011-12-21 ENCOUNTER — Encounter: Payer: Self-pay | Admitting: Vascular Surgery

## 2011-12-21 ENCOUNTER — Ambulatory Visit (INDEPENDENT_AMBULATORY_CARE_PROVIDER_SITE_OTHER): Payer: BC Managed Care – PPO | Admitting: Vascular Surgery

## 2011-12-21 ENCOUNTER — Other Ambulatory Visit: Payer: Self-pay | Admitting: *Deleted

## 2011-12-21 VITALS — BP 145/89 | HR 79 | Resp 20 | Ht 73.0 in | Wt 189.0 lb

## 2011-12-21 DIAGNOSIS — I83893 Varicose veins of bilateral lower extremities with other complications: Secondary | ICD-10-CM

## 2011-12-21 DIAGNOSIS — M7989 Other specified soft tissue disorders: Secondary | ICD-10-CM

## 2011-12-21 DIAGNOSIS — M79609 Pain in unspecified limb: Secondary | ICD-10-CM

## 2011-12-21 NOTE — Progress Notes (Signed)
Subjective:     Patient ID: Sean Riley, male   DOB: 01-03-1951, 61 y.o.   MRN: [id number]  HPI this 61 year old male returns 1 week post laser ablation right great saphenous vein for painful varicosities. This was performed under local tumescent anesthesia one week ago. He has had some mild to moderate discomfort along the course of the great saphenous vein or significant than the previous procedure on the left. He has had no change in distal edema. He denies any chest pain, dyspnea on exertion, orthopnea, or hemoptysis. He has taken his ibuprofen intermittently. He has been wearing his elastic compression stockings and elevate the legs as instructed.  Past Medical History  Diagnosis Date  . Atrial fibrillation   . HYPERLIPIDEMIA   . 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 Microscopic hematuria   . NONSPECIFIC ABNORMAL ELECTROCARDIOGRAM   . SINUSITIS- ACUTE-NOS   . SMOKER   . GERD (gastroesophageal reflux disease)   . Varicose veins     History  Substance Use Topics  . Smoking status: Current Everyday Smoker -- 1.0 packs/day for 40 years  . Smokeless tobacco: Not on file  . Alcohol Use: 0.0 oz/week     4-6 can of beer daily    Family History  Problem Relation Age of Onset  . Hypertension Mother   . Hypertension Father   . Prostate cancer Father   . Coronary artery disease Maternal Grandmother     MI in 67s  . Arrhythmia Other   . Coronary artery disease Maternal Aunt     MI in 103s    Allergies  Allergen Reactions  . Cephalexin     REACTION: RASH  . Celecoxib     REACTION: GI UPSET  . Codeine     REACTION: GI UPSET  . Nsaids     REACTION: GI UPSET  . Simvastatin     REACTION: mental status changes    Current outpatient prescriptions:Alum & Mag Hydroxide-Simeth (MAGIC MOUTHWASH) SOLN, Take 5 mLs by mouth 3 (three) times daily., Disp: 90 mL, Rfl: 0;  budesonide-formoterol (SYMBICORT) 160-4.5 MCG/ACT inhaler, Inhale 2 puffs into the lungs 2 (two) times  daily.  , Disp: , Rfl: ;  diltiazem (CARDIZEM CD) 180 MG 24 hr capsule, Take 1 capsule (180 mg total) by mouth daily., Disp: 90 capsule, Rfl: 3 esomeprazole (NEXIUM) 40 MG packet, Take 40 mg by mouth daily before breakfast.  , Disp: , Rfl: ;  flecainide (TAMBOCOR) 100 MG tablet, Take 1 tablet (100 mg total) by mouth 2 (two) times daily., Disp: 180 tablet, Rfl: 3;  rosuvastatin (CRESTOR) 20 MG tablet, Take 1 tablet (20 mg total) by mouth as directed. 1 tablet every wed. And sat., Disp: 30 tablet, Rfl: 3 sildenafil (VIAGRA) 100 MG tablet, Take 1 tablet (100 mg total) by mouth daily as needed., Disp: 10 tablet, Rfl: 5  BP 145/89  Pulse 79  Resp 20  Ht 6\' 1"  (1.854 m)  Wt 189 lb (85.73 kg)  BMI 24.94 kg/m2  Body mass index is 24.94 kg/(m^2).          Review of Systems     Objective:   Physical Exam pressure 145/89 heart rate 79 respirations 20 General well-developed well-nourished male in no apparent distress alert and oriented x3 Lungs no rhonchi or wheezing Cardiovascular regular rhythm no murmurs Right lower extremity with moderate tenderness along the course of great saphenous vein with mild erythema down to the entrance site and proximal  calf. There is no change in distal edema at one plus. 3+ dorsalis pedis pulse palpable.  Today I ordered a venous duplex exam of the right leg and also performed a bedside SonoSite exam with the ultrasound. Patient has total closure of the right great saphenous vein up to the common femoral vein. There is extension of the thrombus into the common femoral vein which is nonobstructive but the rest of the venous system being widely patent.     Assessment:     Successful laser ablation right great saphenous vein with mild extension of thrombus into common femoral vein at saphenofemoral junction-nonobstructive    Plan:     Will plan to institute 3 months of Coumadin therapy beginning today Patient to return in 3 months with venous duplex exam of  right leg If patient develops worsening swelling or any shortness of breath, chest pain, or hemoptysis he will report to emergency department [facility]

## 2011-12-22 ENCOUNTER — Ambulatory Visit (INDEPENDENT_AMBULATORY_CARE_PROVIDER_SITE_OTHER): Payer: BC Managed Care – PPO | Admitting: Internal Medicine

## 2011-12-22 ENCOUNTER — Encounter: Payer: Self-pay | Admitting: Internal Medicine

## 2011-12-22 VITALS — BP 142/84 | HR 72 | Ht 73.0 in | Wt 194.4 lb

## 2011-12-22 DIAGNOSIS — R0602 Shortness of breath: Secondary | ICD-10-CM

## 2011-12-22 DIAGNOSIS — I4891 Unspecified atrial fibrillation: Secondary | ICD-10-CM

## 2011-12-22 MED ORDER — FLECAINIDE ACETATE 100 MG PO TABS: 100.0000 mg | ORAL_TABLET | Freq: Two times a day (BID) | ORAL | Status: DC

## 2011-12-22 MED ORDER — DILTIAZEM HCL ER COATED BEADS 180 MG PO CP24: 180.0000 mg | ORAL_CAPSULE | Freq: Every day | ORAL | Status: DC

## 2011-12-22 MED ORDER — ESOMEPRAZOLE MAGNESIUM 40 MG PO CPDR: 40.0000 mg | DELAYED_RELEASE_CAPSULE | Freq: Every day | ORAL | Status: DC

## 2011-12-22 MED ORDER — WARFARIN SODIUM 5 MG PO TABS: 5.0000 mg | ORAL_TABLET | Freq: Every day | ORAL | Status: DC

## 2011-12-22 NOTE — Assessment & Plan Note (Signed)
Stable

## 2011-12-22 NOTE — Patient Instructions (Addendum)
Your physician has requested that you have a lexiscan myoview. For further information please visit [email or web address] Please follow instruction sheet, as given.  Your physician recommends that you continue on your current medications as directed. Please refer to the Current Medication list given to you today.  Your physician wants you to follow-up in: 1 year with Dr. Graciela Husbands. You will receive a reminder letter in the mail two months in advance. If you don't receive a letter, please call our office to schedule the follow-up appointment.

## 2011-12-22 NOTE — Assessment & Plan Note (Signed)
Holding sinus on flecanide.  It has been 3-4 yrs since last myoview and with RBBB and inability to ambulate will do lexiscan myoview given sob and class 1c therapy

## 2011-12-22 NOTE — Progress Notes (Signed)
HPI  Sean Riley is a 61 y.o. male Seen  in follow for atrial fibrillation. He continues to take flecainide he's been largely asymptomatic. he is smoking still but only down to a pack a day. He is rolling his own cigarettes and he thinks that these are less toxic.  The patient denies SOB, chest pain edema or palpitations.  There has been no syncope or presyncope.  He has had two vein surgeries of late with a "right femoral flap">>coumadin  Past Medical History  Diagnosis Date  . Atrial fibrillation   . HYPERLIPIDEMIA   . 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 Microscopic hematuria   . NONSPECIFIC ABNORMAL ELECTROCARDIOGRAM   . SINUSITIS- ACUTE-NOS   . SMOKER   . GERD (gastroesophageal reflux disease)   . Varicose veins     Past Surgical History  Procedure Date  . Tracheotomy     age 9 for croup  . Knee arthroscopy 2012    Dr Collins  . Shoulder arthroscopy 2011    Dr Collins  . Coronary angioplasty   . Appendectomy   . Lumbar laminectomy 1987    Dr Whitfield  . Rotator cuff repair   . Spinal fusion 1986    Dr Deaton  . Myelogram 2007  . Cardiac catheterization      X3  . Colonoscopy 2007     negative    Current Outpatient Prescriptions  Medication Sig Dispense Refill  . budesonide-formoterol (SYMBICORT) 160-4.5 MCG/ACT inhaler Inhale 2 puffs into the lungs 2 (two) times daily.        . diltiazem (CARDIZEM CD) 180 MG 24 hr capsule Take 1 capsule (180 mg total) by mouth daily.  90 capsule  3  . esomeprazole (NEXIUM) 40 MG packet Take 40 mg by mouth daily before breakfast.        . flecainide (TAMBOCOR) 100 MG tablet Take 1 tablet (100 mg total) by mouth 2 (two) times daily.  180 tablet  3  . sildenafil (VIAGRA) 100 MG tablet Take 1 tablet (100 mg total) by mouth daily as needed.  10 tablet  5  . warfarin (COUMADIN) 5 MG tablet Take 5 mg by mouth daily.      . rosuvastatin (CRESTOR) 20 MG tablet 20 mg. 1 tablet every wed. And sat.      .  DISCONTD: rosuvastatin (CRESTOR) 20 MG tablet Take 1 tablet (20 mg total) by mouth as directed. 1 tablet every wed. And sat.  30 tablet  3    Allergies  Allergen Reactions  . Cephalexin     REACTION: RASH  . Celecoxib     REACTION: GI UPSET  . Codeine     REACTION: GI UPSET  . Nsaids     REACTION: GI UPSET  . Simvastatin     REACTION: mental status changes    Review of Systems negative except from HPI and PMH  Physical Exam BP 142/84  Pulse 72  Ht 6' 1" (1.854 m)  Wt 194 lb 6.4 oz (88.179 kg)  BMI 25.65 kg/m2 Well developed and well nourished in no acute distress HENT normal E scleral and icterus clear Neck Supple JVP flat; carotids brisk and full Clear to ausculation but decreaeed breath sounds Regular rate and rhythm, no murmurs gallops or rub Soft with active bowel sounds No clubbing cyanosis none Edema Alert and oriented, grossly normal motor and sensory function Skin Warm and Dry  nsr rbbb 17/10/40  Assessment and    Plan  

## 2011-12-22 NOTE — Assessment & Plan Note (Signed)
Encouraged again to stop  Apparently i convinced his brother to stop

## 2011-12-24 ENCOUNTER — Ambulatory Visit (INDEPENDENT_AMBULATORY_CARE_PROVIDER_SITE_OTHER): Payer: BC Managed Care – PPO

## 2011-12-24 VITALS — BP 116/70 | HR 85 | Wt 193.0 lb

## 2011-12-24 DIAGNOSIS — I4891 Unspecified atrial fibrillation: Secondary | ICD-10-CM

## 2011-12-24 DIAGNOSIS — Z7901 Long term (current) use of anticoagulants: Secondary | ICD-10-CM

## 2011-12-24 LAB — POCT INR: INR: 2.3

## 2011-12-24 NOTE — Patient Instructions (Signed)
Per Dr.Paz (JP), patient to continue 5 mg and recheck on Monday. Patient states he has a pending appointment on Tuesday at Cardiology, patient will have PT/INR checked there.

## 2011-12-28 NOTE — Procedures (Unsigned)
DUPLEX DEEP VENOUS EXAM - LOWER EXTREMITY  INDICATION:  Varicose veins, pain, swelling.  HISTORY:  Edema:  Yes Trauma/Surgery:  Right endovenous laser ablation on 12/14/2011. Pain:  Yes PE:  No Previous DVT:  No Anticoagulants:  No Other:  DUPLEX EXAM:               CFV   SFV   PopV  PTV    GSV               R  L  R  L  R  L  R   L  R  L Thrombosis    +  0  0     0     0      + Spontaneous   +  +  +     +     +      0 Phasic        D  +  +     +     +      0 Augmentation  D  +  +     +     +      0 Compressible  P  +  +     +     +      0 Competent     +  +  +     +     +  Legend:  + - yes  o - no  p - partial  D - decreased   IMPRESSION: 1. Right great saphenous vein good post ablation result. 2. 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 3. Remainder of deep venous system of the right lower extremity     appears patent and compressible. 4. Patent and compressible contralateral common femoral vein present.       _____________________________ Quita Skye. Hart Rochester, M.D.  SH/MEDQ  D:  12/21/2011  T:  12/21/2011  Job:  [id number]

## 2011-12-29 ENCOUNTER — Ambulatory Visit (HOSPITAL_COMMUNITY): Payer: BC Managed Care – PPO | Attending: Cardiology | Admitting: Radiology

## 2011-12-29 ENCOUNTER — Ambulatory Visit (INDEPENDENT_AMBULATORY_CARE_PROVIDER_SITE_OTHER): Payer: BC Managed Care – PPO | Admitting: *Deleted

## 2011-12-29 VITALS — BP 130/82 | HR 77 | Temp 98.3°F | Wt 194.0 lb

## 2011-12-29 VITALS — BP 131/94 | Ht 73.0 in | Wt 191.0 lb

## 2011-12-29 DIAGNOSIS — I4891 Unspecified atrial fibrillation: Secondary | ICD-10-CM | POA: Insufficient documentation

## 2011-12-29 DIAGNOSIS — I4510 Unspecified right bundle-branch block: Secondary | ICD-10-CM | POA: Insufficient documentation

## 2011-12-29 DIAGNOSIS — R0602 Shortness of breath: Secondary | ICD-10-CM | POA: Insufficient documentation

## 2011-12-29 DIAGNOSIS — F17200 Nicotine dependence, unspecified, uncomplicated: Secondary | ICD-10-CM | POA: Insufficient documentation

## 2011-12-29 DIAGNOSIS — E785 Hyperlipidemia, unspecified: Secondary | ICD-10-CM | POA: Insufficient documentation

## 2011-12-29 DIAGNOSIS — Z7901 Long term (current) use of anticoagulants: Secondary | ICD-10-CM

## 2011-12-29 DIAGNOSIS — I739 Peripheral vascular disease, unspecified: Secondary | ICD-10-CM | POA: Insufficient documentation

## 2011-12-29 LAB — POCT INR: INR: 4.4

## 2011-12-29 MED ORDER — TECHNETIUM TC 99M TETROFOSMIN IV KIT
30.0000 | PACK | Freq: Once | INTRAVENOUS | Status: AC | PRN
  Administered 2011-12-29: 30 via INTRAVENOUS

## 2011-12-29 MED ORDER — REGADENOSON 0.4 MG/5ML IV SOLN
0.4000 mg | Freq: Once | INTRAVENOUS | Status: AC
  Administered 2011-12-29: 0.4 mg via INTRAVENOUS

## 2011-12-29 MED ORDER — TECHNETIUM TC 99M TETROFOSMIN IV KIT
10.0000 | PACK | Freq: Once | INTRAVENOUS | Status: AC | PRN
  Administered 2011-12-29: 10 via INTRAVENOUS

## 2011-12-29 NOTE — Progress Notes (Signed)
[facility] SITE 3 NUCLEAR MED [address] [place] [address] [phone]  Cardiology Nuclear Med Study  Sean Riley is a 61 y.o. male     MRN : [id number]     DOB: 06/21/1951  Procedure Date: 12/29/2011  Nuclear Med Background Indication for Stress Test:  Evaluation for Ischemia History: AFib,07 Echo Borederline MVP -EF-NML LV fx,07'GXT, 04 Heart Catheterization,5/11 Myocardial Perfusion Study EF-56% activity-inf wall isch-can't be ruled out Cardiac Risk Factors: Lipids, PVD, RBBB and Smoker  Symptoms:  no symptoms reported   Nuclear Pre-Procedure Caffeine/Decaff Intake:  None NPO After: 7:00pm   Lungs:  clear O2 Sat: 96% on room air. IV 0.9% NS with Angio Cath:  20g  IV Site: R Hand  IV Started by:  Cathlyn Parsons, RN  Chest Size (in):  46 Cup Size: n/a  Height: 6\' 1"  (1.854 m)  Weight:  191 lb (86.637 kg)  BMI:  Body mass index is 25.20 kg/(m^2). Tech Comments:  n/a    Nuclear Med Study 1 or 2 day study: 1 day  Stress Test Type:  Treadmill/Lexiscan  Reading MD: Olga Millers, MD  Order Authorizing Provider:  Ferman Hamming  Resting Radionuclide: Technetium 35m Tetrofosmin  Resting Radionuclide Dose: 10.8 mCi   Stress Radionuclide:  Technetium 67m Tetrofosmin  Stress Radionuclide Dose: 33.0 mCi           Stress Protocol Rest HR: 70 Stress HR: 88  Rest BP: 131/94 Stress BP: 148/98  Exercise Time (min): n/a METS: n/a   Predicted Max HR: 158 bpm % Max HR: 55.7 bpm Rate Pressure Product: 87564   Dose of Adenosine (mg):  n/a Dose of Lexiscan: 0.4 mg  Dose of Atropine (mg): n/a Dose of Dobutamine: n/a mcg/kg/min (at max HR)  Stress Test Technologist: Frederick Peers, EMT-P  Nuclear Technologist:  Domenic Polite, CNMT     Rest Procedure:  Myocardial perfusion imaging was performed at rest 45 minutes following the intravenous administration of Technetium 30m Tetrofosmin. Rest ECG: NSR-RBBB  Stress Procedure:  The patient received IV  Lexiscan 0.4 mg over 15-seconds with concurrent low level exercise and then Technetium 50m Tetrofosmin was injected at 30-seconds while the patient continued walking one more minute. There were no significant changes with Lexiscan. Quantitative spect images were obtained after a 45-minute delay. Stress ECG: No significant ST segment change suggestive of ischemia.  QPS Raw Data Images:  Acquisition technically good; normal left ventricular size. Stress Images:  There is decreased uptake in the inferior wall. Rest Images:  There is decreased uptake in the inferior wall, slightly less prominent compared to the stress images. Subtraction (SDS):  These findings are consistent with prior inferior infarct and very mild peri-infarct ischemia. Transient Ischemic Dilatation (Normal <1.22):  1.06 Lung/Heart Ratio (Normal <0.45):  0.40  Quantitative Gated Spect Images QGS EDV:  129 ml QGS ESV:  54 ml  Impression Exercise Capacity:  Lexiscan with no exercise. BP Response:  Normal blood pressure response. Clinical Symptoms:  There is dyspnea. ECG Impression:  No significant ST segment change suggestive of ischemia. Comparison with Prior Nuclear Study: Inferior defect more prominent compared to previous  Overall Impression:  Abnormal stress nuclear study with a large, severe, partially reversible inferior defect consistent with prior inferior infarct and very mild peri-infarct ischemia.  LV Ejection Fraction: 58%.  LV Wall Motion:  NL LV Function; NL Wall Motion   Olga Millers

## 2011-12-29 NOTE — Patient Instructions (Signed)
Return to office in 1 day (tomorrow)  New dosing: Hold today

## 2011-12-30 ENCOUNTER — Ambulatory Visit (INDEPENDENT_AMBULATORY_CARE_PROVIDER_SITE_OTHER): Payer: BC Managed Care – PPO

## 2011-12-30 VITALS — BP 118/70 | HR 82 | Wt 192.0 lb

## 2011-12-30 DIAGNOSIS — I4891 Unspecified atrial fibrillation: Secondary | ICD-10-CM

## 2011-12-30 DIAGNOSIS — Z7901 Long term (current) use of anticoagulants: Secondary | ICD-10-CM

## 2011-12-30 LAB — POCT INR: INR: 3.9

## 2011-12-30 NOTE — Patient Instructions (Signed)
Per Dr.Hopper, HOLD Today, then 5 mg alternating with 2.5 mg. Recheck on Tuesday 01/05/12

## 2012-01-05 ENCOUNTER — Telehealth: Payer: Self-pay

## 2012-01-05 ENCOUNTER — Ambulatory Visit (INDEPENDENT_AMBULATORY_CARE_PROVIDER_SITE_OTHER): Payer: BC Managed Care – PPO | Admitting: *Deleted

## 2012-01-05 DIAGNOSIS — Z7901 Long term (current) use of anticoagulants: Secondary | ICD-10-CM

## 2012-01-05 DIAGNOSIS — I4891 Unspecified atrial fibrillation: Secondary | ICD-10-CM

## 2012-01-05 LAB — PROTIME-INR: INR: 1.4 ratio — ABNORMAL HIGH (ref 0.8–1.0)

## 2012-01-05 NOTE — Telephone Encounter (Signed)
Message copied by Maurice Small on Tue Jan 05, 2012  5:10 PM ------      Message from: Pecola Lawless      Created: Tue Jan 05, 2012  1:20 PM       ? Present dose of warfarin      ----- Message -----         From: [facility] Interface         Sent: 01/05/2012  12:20 PM           To: Pecola Lawless, MD

## 2012-01-05 NOTE — Progress Notes (Signed)
Pt inr only

## 2012-01-05 NOTE — Telephone Encounter (Signed)
Patient did not take med as instructed, patient was only taking 2.5 mg daily vs alternating with 5mg .  Patient and his wife informed of New Instruction per Dr.Hopper: 5 mg today, 5 mg tomorrow, then 5 mg M/W/F/Sun, 2.5 mg on T/Th/Sat and recheck in 3 weeks.  Patient schedule appointment to recheck on 01/27/12

## 2012-01-14 ENCOUNTER — Telehealth: Payer: Self-pay | Admitting: *Deleted

## 2012-01-14 DIAGNOSIS — I4891 Unspecified atrial fibrillation: Secondary | ICD-10-CM

## 2012-01-14 DIAGNOSIS — R9430 Abnormal result of cardiovascular function study, unspecified: Secondary | ICD-10-CM

## 2012-01-14 NOTE — Telephone Encounter (Signed)
Duke Salvia, MD More Detail >>      Duke Salvia, MD        Sent: Wed January 13, 2012  6:47 PM    To: Jefferey Pica, RN        Sean Riley    MRN: [id number] DOB: 07-18-1951     Pt Work: [phone] Pt Home: [phone]           Message     Spoke with pt, and he awaits your call :) to set up JV cath thanks ----- Message -----    From: Jefferey Pica, RN    Sent: 01/12/2012  11:17 AM      To: Duke Salvia, MD  Not trying to put this on you, but can you call him. I hate to call him about a cath 2 weeks after his test was done. ----- Message -----    From: Duke Salvia, MD    Sent: 01/05/2012  10:05 PM      To: Jefferey Pica, RN  i have reviewed this and the interpretations while somewhat similar are substantially different. I think he should be cathed based on the suggestions of prior MI and periinfarct ischemia  Let me know on Thurs whether you want to call him or me thanks ----- Message -----    From: Jefferey Pica, RN    Sent: 12/31/2011   1:20 PM      To: Duke Salvia, MD  This patient had a myoview on 5/21. It's in his chart, but I don't see where you signed off on it and it's not in my in basket. Please review.

## 2012-01-15 ENCOUNTER — Encounter: Payer: Self-pay | Admitting: *Deleted

## 2012-01-15 NOTE — Telephone Encounter (Signed)
I spoke with the patient. He is aware that his cath is scheduled for 6/12 at 9:30 am with Dr. Clifton Chawn.

## 2012-01-15 NOTE — Telephone Encounter (Signed)
Fu call °Patient returning your call °

## 2012-01-15 NOTE — Telephone Encounter (Signed)
I spoke with the patient to schedule his left heart cath. He is on warfarin and will need to be off for 5 days prior to his procedure. He is agreeable with going on 01/20/12. He will come for lab work on 01/18/12. He will hold warfarin starting today. I will call him back with the time of his procedure.

## 2012-01-15 NOTE — Telephone Encounter (Signed)
Fu call Pt's wife wanted to talk to you about cath

## 2012-01-18 ENCOUNTER — Other Ambulatory Visit (INDEPENDENT_AMBULATORY_CARE_PROVIDER_SITE_OTHER): Payer: BC Managed Care – PPO

## 2012-01-18 DIAGNOSIS — I4891 Unspecified atrial fibrillation: Secondary | ICD-10-CM

## 2012-01-18 DIAGNOSIS — R9430 Abnormal result of cardiovascular function study, unspecified: Secondary | ICD-10-CM

## 2012-01-18 LAB — BASIC METABOLIC PANEL
BUN: 7 mg/dL (ref 6–23)
Calcium: 8.6 mg/dL (ref 8.4–10.5)
Creatinine, Ser: 0.8 mg/dL (ref 0.4–1.5)
GFR: 104.06 mL/min (ref >60.00)
Glucose, Bld: 92 mg/dL (ref 70–99)
Potassium: 4.2 mEq/L (ref 3.5–5.1)

## 2012-01-18 LAB — CBC WITH DIFFERENTIAL/PLATELET
Basophils Relative: 0.1 % (ref 0.0–3.0)
Eosinophils Absolute: 0.2 10*3/uL (ref 0.0–0.7)
Eosinophils Relative: 2.7 % (ref 0.0–5.0)
HCT: 53.2 % — ABNORMAL HIGH (ref 39.0–52.0)
Hemoglobin: 18.1 g/dL (ref 13.0–17.0)
Lymphs Abs: 1 10*3/uL (ref 0.7–4.0)
MCHC: 33.9 g/dL (ref 30.0–36.0)
MCV: 98.9 fl (ref 78.0–100.0)
Monocytes Absolute: 0.6 10*3/uL (ref 0.1–1.0)
Neutro Abs: 4.2 10*3/uL (ref 1.4–7.7)
RBC: 5.39 Mil/uL (ref 4.22–5.81)
WBC: 6 10*3/uL (ref 4.5–10.5)

## 2012-01-20 ENCOUNTER — Encounter (HOSPITAL_BASED_OUTPATIENT_CLINIC_OR_DEPARTMENT_OTHER)
Admission: RE | Disposition: A | Discharge status: Home / Self Care | Payer: Self-pay | Source: Ambulatory Visit | Attending: Cardiovascular Disease

## 2012-01-20 ENCOUNTER — Inpatient Hospital Stay (HOSPITAL_BASED_OUTPATIENT_CLINIC_OR_DEPARTMENT_OTHER)
Admission: RE | Admit: 2012-01-20 | Discharge: 2012-01-20 | Disposition: A | Discharge status: Home / Self Care | Payer: BC Managed Care – PPO | Source: Ambulatory Visit | Attending: Cardiovascular Disease | Admitting: Cardiovascular Disease

## 2012-01-20 DIAGNOSIS — I4891 Unspecified atrial fibrillation: Secondary | ICD-10-CM | POA: Insufficient documentation

## 2012-01-20 DIAGNOSIS — R0989 Other specified symptoms and signs involving the circulatory and respiratory systems: Secondary | ICD-10-CM | POA: Insufficient documentation

## 2012-01-20 DIAGNOSIS — E785 Hyperlipidemia, unspecified: Secondary | ICD-10-CM | POA: Insufficient documentation

## 2012-01-20 DIAGNOSIS — R0609 Other forms of dyspnea: Secondary | ICD-10-CM | POA: Insufficient documentation

## 2012-01-20 DIAGNOSIS — R9439 Abnormal result of other cardiovascular function study: Secondary | ICD-10-CM | POA: Insufficient documentation

## 2012-01-20 DIAGNOSIS — I2510 Atherosclerotic heart disease of native coronary artery without angina pectoris: Secondary | ICD-10-CM | POA: Insufficient documentation

## 2012-01-20 DIAGNOSIS — F17200 Nicotine dependence, unspecified, uncomplicated: Secondary | ICD-10-CM | POA: Insufficient documentation

## 2012-01-20 DIAGNOSIS — K219 Gastro-esophageal reflux disease without esophagitis: Secondary | ICD-10-CM | POA: Insufficient documentation

## 2012-01-20 SURGERY — JV LEFT HEART CATHETERIZATION WITH CORONARY ANGIOGRAM
Anesthesia: Moderate Sedation

## 2012-01-20 MED ORDER — DIAZEPAM 5 MG PO TABS
5.0000 mg | ORAL_TABLET | ORAL | Status: AC
  Administered 2012-01-20: 5 mg via ORAL

## 2012-01-20 MED ORDER — ACETAMINOPHEN 325 MG PO TABS: 650.0000 mg | ORAL_TABLET | ORAL | Status: DC | PRN

## 2012-01-20 MED ORDER — ASPIRIN 81 MG PO CHEW
324.0000 mg | CHEWABLE_TABLET | ORAL | Status: AC
  Administered 2012-01-20: 324 mg via ORAL

## 2012-01-20 MED ORDER — SODIUM CHLORIDE 0.9 % IJ SOLN: 3.0000 mL | INTRAMUSCULAR | Status: DC | PRN

## 2012-01-20 MED ORDER — SODIUM CHLORIDE 0.9 % IV SOLN: INTRAVENOUS | Status: DC

## 2012-01-20 MED ORDER — SODIUM CHLORIDE 0.9 % IV SOLN: 250.0000 mL | INTRAVENOUS | Status: DC | PRN

## 2012-01-20 MED ORDER — SODIUM CHLORIDE 0.9 % IJ SOLN: 3.0000 mL | Freq: Two times a day (BID) | INTRAMUSCULAR | Status: DC

## 2012-01-20 MED ORDER — SODIUM CHLORIDE 0.9 % IV SOLN: INTRAVENOUS | Status: AC

## 2012-01-20 NOTE — CV Procedure (Signed)
   Cardiac Catheterization Operative Report  Sean Riley [id number] 6/12/201310:14 AM Marga Melnick, MD  Procedure Performed:  1. Left Heart Catheterization 2. Selective Coronary Angiography 3. Left ventricular angiogram  Operator: Verne Carrow, MD  Indication:  Pt had an abnormal stress myoview showing possible inferior scar and ischemia. This was arranged in workup of dyspnea.                                      Procedure Details: The risks, benefits, complications, treatment options, and expected outcomes were discussed with the patient. The patient and/or family concurred with the proposed plan, giving informed consent. The patient was brought to the cath lab after IV hydration was begun and oral premedication was given. The patient was further sedated with Versed and Fentanyl. The right groin was prepped and draped in the usual manner. Using the modified Seldinger access technique, a 4 French sheath was placed in the right femoral artery. Standard diagnostic catheters were used to perform selective coronary angiography. A pigtail catheter was used to perform a left ventricular angiogram.  There were no immediate complications. The patient was taken to the recovery area in stable condition.   Hemodynamic Findings: Central aortic pressure: 122/68 Left ventricular pressure: 114/4/4  Angiographic Findings:  Left main: No obstructive disease.   Left Anterior Descending Artery: Large caliber vessel that courses to the apex. The mid segment is mildly aneurysmal. No obstructive disease noted. Large diagonal branch with mild plaque.   Circumflex Artery: Moderate sized vessel with mild plaque disease. No obstructive lesions noted.   Right Coronary Artery: Large caliber, dominant vessel with 20% proximal stenosis, smooth tubular 20% mid stenosis and 30% stenosis just prior to the bifurcation. The PDA and PL branch are small caliber and both have mild plaque.   Left  Ventricular Angiogram: LVEF 50-55%.   Impression: 1. Mild non-obstructive CAD 2. Normal LV systolic function   Recommendations: Continue medical management.        Complications:  None. The patient tolerated the procedure well.

## 2012-01-20 NOTE — Interval H&P Note (Signed)
History and Physical Interval Note:  01/20/2012 9:39 AM  Sean Riley  has presented today for surgery, with the diagnosis of abn ST  The various methods of treatment have been discussed with the patient and family. After consideration of risks, benefits and other options for treatment, the patient has consented to  Procedure(s) (LRB): JV LEFT HEART CATHETERIZATION WITH CORONARY ANGIOGRAM (N/A) as a surgical intervention .  The patients' history has been reviewed, patient examined, no change in status, stable for surgery.  I have reviewed the patients' chart and labs.  Questions were answered to the patient's satisfaction.     Shakim Faith

## 2012-01-20 NOTE — H&P (View-Only) (Signed)
HPI  Sean Riley is a 61 y.o. male Seen  in follow for atrial fibrillation. He continues to take flecainide he's been largely asymptomatic. he is smoking still but only down to a pack a day. He is rolling his own cigarettes and he thinks that these are less toxic.  The patient denies SOB, chest pain edema or palpitations.  There has been no syncope or presyncope.  He has had two vein surgeries of late with a "right femoral flap">>coumadin  Past Medical History  Diagnosis Date  . Atrial fibrillation   . HYPERLIPIDEMIA   . 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 Microscopic hematuria   . NONSPECIFIC ABNORMAL ELECTROCARDIOGRAM   . SINUSITIS- ACUTE-NOS   . SMOKER   . GERD (gastroesophageal reflux disease)   . Varicose veins     Past Surgical History  Procedure Date  . Tracheotomy     age 23 for croup  . Knee arthroscopy 2012    Dr Thomasena Edis  . Shoulder arthroscopy 2011    Dr Thomasena Edis  . Coronary angioplasty   . Appendectomy   . Lumbar laminectomy 1987    Dr Cleophas Dunker  . Rotator cuff repair   . Spinal fusion 1986    Dr Elesa Hacker  . Myelogram 2007  . Cardiac catheterization      X3  . Colonoscopy 2007     negative    Current Outpatient Prescriptions  Medication Sig Dispense Refill  . budesonide-formoterol (SYMBICORT) 160-4.5 MCG/ACT inhaler Inhale 2 puffs into the lungs 2 (two) times daily.        Marland Kitchen diltiazem (CARDIZEM CD) 180 MG 24 hr capsule Take 1 capsule (180 mg total) by mouth daily.  90 capsule  3  . esomeprazole (NEXIUM) 40 MG packet Take 40 mg by mouth daily before breakfast.        . flecainide (TAMBOCOR) 100 MG tablet Take 1 tablet (100 mg total) by mouth 2 (two) times daily.  180 tablet  3  . sildenafil (VIAGRA) 100 MG tablet Take 1 tablet (100 mg total) by mouth daily as needed.  10 tablet  5  . warfarin (COUMADIN) 5 MG tablet Take 5 mg by mouth daily.      . rosuvastatin (CRESTOR) 20 MG tablet 20 mg. 1 tablet every wed. And sat.      Marland Kitchen  DISCONTD: rosuvastatin (CRESTOR) 20 MG tablet Take 1 tablet (20 mg total) by mouth as directed. 1 tablet every wed. And sat.  30 tablet  3    Allergies  Allergen Reactions  . Cephalexin     REACTION: RASH  . Celecoxib     REACTION: GI UPSET  . Codeine     REACTION: GI UPSET  . Nsaids     REACTION: GI UPSET  . Simvastatin     REACTION: mental status changes    Review of Systems negative except from HPI and PMH  Physical Exam BP 142/84  Pulse 72  Ht 6\' 1"  (1.854 m)  Wt 194 lb 6.4 oz (88.179 kg)  BMI 25.65 kg/m2 Well developed and well nourished in no acute distress HENT normal E scleral and icterus clear Neck Supple JVP flat; carotids brisk and full Clear to ausculation but decreaeed breath sounds Regular rate and rhythm, no murmurs gallops or rub Soft with active bowel sounds No clubbing cyanosis none Edema Alert and oriented, grossly normal motor and sensory function Skin Warm and Dry  nsr rbbb 17/10/40  Assessment and  Plan

## 2012-01-20 NOTE — OR Nursing (Signed)
Tegaderm dressing applied, site level 0, bedrest begins at 1025 

## 2012-01-20 NOTE — OR Nursing (Signed)
Meal served 

## 2012-01-20 NOTE — OR Nursing (Signed)
Discharge instructions reviewed and signed, pt stated understanding, ambulated in hall without difficulty, site level 0, transported to wife's car via wheelchair 

## 2012-01-20 NOTE — OR Nursing (Signed)
Dr McAlhany at bedside to discuss results and treatment plan with pt and family 

## 2012-01-27 ENCOUNTER — Ambulatory Visit: Payer: BC Managed Care – PPO

## 2012-02-12 ENCOUNTER — Encounter: Payer: Self-pay | Admitting: Internal Medicine

## 2012-02-12 ENCOUNTER — Ambulatory Visit (INDEPENDENT_AMBULATORY_CARE_PROVIDER_SITE_OTHER): Payer: BC Managed Care – PPO | Admitting: Internal Medicine

## 2012-02-12 VITALS — BP 128/80 | HR 78 | Ht 73.0 in | Wt 191.0 lb

## 2012-02-12 DIAGNOSIS — I4891 Unspecified atrial fibrillation: Secondary | ICD-10-CM

## 2012-02-12 NOTE — Assessment & Plan Note (Signed)
Paroxysmal atrial fibrillation on flecainide. He does not need anticoagulation for his atrial fibrillation as his CHADS-VASc score 0.

## 2012-02-12 NOTE — Progress Notes (Signed)
HPI  Sean Riley is a 61 y.o. male Seen in follow for atrial fibrillation. He continues to take flecainide he's been largely asymptomatic.  he is smoking still but only down to a pack a day. He is rolling his own cigarettes and he thinks that these are less toxic.  The patient denies SOB, chest pain edema or palpitations. There has been no syncope or presyncope.  He has had two vein surgeries of late with a "right femoral flap">>coumadin   He underwent Myoview scanning which was abnormal and suffered catheterization which was normal. 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  He did not resume his anticoagulation following his catheterization   Past Medical History  Diagnosis Date  . Atrial fibrillation   . HYPERLIPIDEMIA   . 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 Microscopic hematuria   . NONSPECIFIC ABNORMAL ELECTROCARDIOGRAM   . SINUSITIS- ACUTE-NOS   . SMOKER   . GERD (gastroesophageal reflux disease)   . Varicose veins     Past Surgical History  Procedure Date  . Tracheotomy     age 53 for croup  . Knee arthroscopy 2012    Dr Thomasena Edis  . Shoulder arthroscopy 2011    Dr Thomasena Edis  . Coronary angioplasty   . Appendectomy   . Lumbar laminectomy 1987    Dr Cleophas Dunker  . Rotator cuff repair   . Spinal fusion 1986    Dr Elesa Hacker  . Myelogram 2007  . Cardiac catheterization      X3  . Colonoscopy 2007     negative    Current Outpatient Prescriptions  Medication Sig Dispense Refill  . budesonide-formoterol (SYMBICORT) 160-4.5 MCG/ACT inhaler Inhale 2 puffs into the lungs 2 (two) times daily. As needed      . cefdinir (OMNICEF) 300 MG capsule as directed.      . diltiazem (CARDIZEM CD) 180 MG 24 hr capsule Take 1 capsule (180 mg total) by mouth daily.  90 capsule  3  . esomeprazole (NEXIUM) 40 MG capsule Take 1 capsule (40 mg total) by mouth daily before breakfast.   90 capsule  3  . flecainide (TAMBOCOR) 100 MG tablet Take 1 tablet (100 mg total) by mouth 2 (two) times daily.  180 tablet  3  . HYDROcodone-homatropine (HYCODAN) 5-1.5 MG/5ML syrup as directed.      . predniSONE (DELTASONE) 20 MG tablet as directed.      . rosuvastatin (CRESTOR) 20 MG tablet 20 mg. Has not taken in a month      . sildenafil (VIAGRA) 100 MG tablet Take 1 tablet (100 mg total) by mouth daily as needed.  10 tablet  5    Allergies  Allergen Reactions  . Cephalexin     REACTION: RASH  . Celecoxib     REACTION: GI UPSET  . Codeine     REACTION: GI UPSET  . Nsaids     REACTION: GI UPSET  . Simvastatin     REACTION: mental status changes    Review of Systems negative except from HPI and PMH  Physical Exam BP 128/80  Pulse 78  Ht 6\' 1"  (1.854 m)  Wt 191 lb (86.637 kg)  BMI 25.20 kg/m2 Well developed and well nourished in no acute distress HENT normal E scleral and icterus clear Neck Supple JVP flat; carotids brisk and full Clear to ausculation Regular rate and rhythm, no  murmurs gallops or rub Soft with active bowel sounds No clubbing cyanosis none Edema Alert and oriented, grossly normal motor and sensory function Skin Warm and Dry  The echocardiogram demonstrates sinus at 78 Intervals 16/13/41 Axis XVII right bundle branch block  Assessment and  Plan

## 2012-02-12 NOTE — Patient Instructions (Signed)
Your physician wants you to follow-up in: 1 year with Dr. Klein. You will receive a reminder letter in the mail two months in advance. If you don't receive a letter, please call our office to schedule the follow-up appointment.  Your physician recommends that you continue on your current medications as directed. Please refer to the Current Medication list given to you today.  

## 2012-02-22 ENCOUNTER — Ambulatory Visit (INDEPENDENT_AMBULATORY_CARE_PROVIDER_SITE_OTHER): Payer: BC Managed Care – PPO

## 2012-02-22 ENCOUNTER — Telehealth: Payer: Self-pay

## 2012-02-22 ENCOUNTER — Telehealth: Payer: Self-pay | Admitting: Internal Medicine

## 2012-02-22 VITALS — BP 130/78 | HR 91 | Wt 188.4 lb

## 2012-02-22 DIAGNOSIS — I4891 Unspecified atrial fibrillation: Secondary | ICD-10-CM

## 2012-02-22 DIAGNOSIS — Z7901 Long term (current) use of anticoagulants: Secondary | ICD-10-CM

## 2012-02-22 MED ORDER — WARFARIN SODIUM 5 MG PO TABS: ORAL_TABLET | ORAL | Status: DC

## 2012-02-22 NOTE — Telephone Encounter (Signed)
New problem:  Patient was told by Dr. Graciela Husbands to ask Dr. Alwyn Ren is he can be on another blood thinner. That his pt/inr does not have to monitor. Dr. Alwyn Ren advise that Dr. Graciela Husbands need to address this issues.

## 2012-02-22 NOTE — Telephone Encounter (Signed)
Message copied by Maurice Small on Mon Feb 22, 2012  1:28 PM ------      Message from: Pecola Lawless      Created: Mon Feb 22, 2012 12:26 PM       I reviewed Dr. Odessa Fleming note; the Coumadin is for arterial disease in his legs. He would need to be on Coumadin as long as the vascular specialist feels this is necessary. He does not need to be on Coumadin for the atrial fibrillation as per Dr Graciela Husbands.PT/INR is therapeutic. No change in warfarin dose; repeat PT/INR in 2 weeks

## 2012-02-22 NOTE — Telephone Encounter (Signed)
.   Anticoagulation was started by Dr. Hart Rochester because of the flap it is femoral venous system. I've asked that he resume his Coumadin until he sees Dr. Hart Rochester. He does not need anticoagulation for his atrial fibrillation as his CHADS-VASc score is 0

## 2012-02-22 NOTE — Telephone Encounter (Signed)
Will forward to Dr. Graciela Husbands as I am not sure what the conversation was regarding this issue.

## 2012-02-22 NOTE — Patient Instructions (Signed)
Per Dr.Hopper continue regular schedule and recheck PT/INR in 2 weeks

## 2012-02-22 NOTE — Telephone Encounter (Signed)
Spoke with patient, patient verbalized understanding and schedule appointment to return on 03/08/2012 for Nurse Visit

## 2012-02-22 NOTE — Telephone Encounter (Signed)
I have already spoke with patient and he will see Dr.Lawson mid August to determine how long to remain on blood thinner.

## 2012-02-22 NOTE — Telephone Encounter (Signed)
As per Dr Graciela Husbands; he does need to see  Dr. Hart Rochester to determine how long the Coumadin must be continued

## 2012-03-08 ENCOUNTER — Ambulatory Visit: Payer: BC Managed Care – PPO

## 2012-03-08 ENCOUNTER — Telehealth: Payer: Self-pay | Admitting: *Deleted

## 2012-03-08 ENCOUNTER — Ambulatory Visit (INDEPENDENT_AMBULATORY_CARE_PROVIDER_SITE_OTHER): Payer: BC Managed Care – PPO | Admitting: *Deleted

## 2012-03-08 VITALS — BP 120/80 | HR 80 | Wt 189.0 lb

## 2012-03-08 DIAGNOSIS — IMO0002 Reserved for concepts with insufficient information to code with codable children: Secondary | ICD-10-CM

## 2012-03-08 DIAGNOSIS — Z7901 Long term (current) use of anticoagulants: Secondary | ICD-10-CM

## 2012-03-08 DIAGNOSIS — I4891 Unspecified atrial fibrillation: Secondary | ICD-10-CM

## 2012-03-08 LAB — CBC WITH DIFFERENTIAL/PLATELET
Basophils Relative: 0.3 % (ref 0.0–3.0)
Eosinophils Absolute: 0.1 10*3/uL (ref 0.0–0.7)
Hemoglobin: 17.3 g/dL — ABNORMAL HIGH (ref 13.0–17.0)
Lymphocytes Relative: 16.2 % (ref 12.0–46.0)
MCHC: 34.1 g/dL (ref 30.0–36.0)
Monocytes Relative: 6.7 % (ref 3.0–12.0)
Neutro Abs: 6 10*3/uL (ref 1.4–7.7)
RBC: 5.22 Mil/uL (ref 4.22–5.81)

## 2012-03-08 LAB — PROTIME-INR: Prothrombin Time: 80.6 s (ref 10.2–12.4)

## 2012-03-08 NOTE — Telephone Encounter (Signed)
Per Dr Alwyn Ren no coumadin today recheck INR tomorrow. Pt to watch for any bleeding and avoid aspirin, and if he experience bleeding he is to report to the ED immediately, Pt ok verbalized understanding. Appt schedule for tomorrow

## 2012-03-08 NOTE — Telephone Encounter (Signed)
INR 7.2 , PT 80.55

## 2012-03-08 NOTE — Patient Instructions (Signed)
Return to office daily for INR/PT  Hold coumadin today will confirm reading in lab

## 2012-03-09 ENCOUNTER — Ambulatory Visit (INDEPENDENT_AMBULATORY_CARE_PROVIDER_SITE_OTHER): Payer: BC Managed Care – PPO

## 2012-03-09 VITALS — BP 122/64 | HR 90 | Temp 98.4°F

## 2012-03-09 DIAGNOSIS — I4891 Unspecified atrial fibrillation: Secondary | ICD-10-CM

## 2012-03-09 DIAGNOSIS — Z7901 Long term (current) use of anticoagulants: Secondary | ICD-10-CM

## 2012-03-09 NOTE — Patient Instructions (Addendum)
Your INR was 6.9 today. Dr.Hopper would like for you to continue to hold your coumadin on 03/09/12 and 03/10/12 and recheck on Friday 03/11/12. If you have any issues with bleeding he would like for you to proceed to the Emergency room for an evaluation.

## 2012-03-14 ENCOUNTER — Encounter: Payer: Self-pay | Admitting: *Deleted

## 2012-03-14 ENCOUNTER — Ambulatory Visit (INDEPENDENT_AMBULATORY_CARE_PROVIDER_SITE_OTHER): Payer: BC Managed Care – PPO | Admitting: *Deleted

## 2012-03-14 DIAGNOSIS — I4891 Unspecified atrial fibrillation: Secondary | ICD-10-CM

## 2012-03-14 DIAGNOSIS — Z7901 Long term (current) use of anticoagulants: Secondary | ICD-10-CM

## 2012-03-14 NOTE — Patient Instructions (Addendum)
Todays PT/INR 1.5 Take a half tab daily Re-check on Friday 03-18-12

## 2012-03-18 ENCOUNTER — Ambulatory Visit (INDEPENDENT_AMBULATORY_CARE_PROVIDER_SITE_OTHER): Payer: BC Managed Care – PPO | Admitting: *Deleted

## 2012-03-18 VITALS — BP 132/82 | HR 79 | Wt 188.0 lb

## 2012-03-18 DIAGNOSIS — I4891 Unspecified atrial fibrillation: Secondary | ICD-10-CM

## 2012-03-18 LAB — POCT INR: INR: 2.2

## 2012-03-21 ENCOUNTER — Encounter: Payer: Self-pay | Admitting: Vascular Surgery

## 2012-03-22 ENCOUNTER — Encounter: Payer: Self-pay | Admitting: Vascular Surgery

## 2012-03-22 ENCOUNTER — Ambulatory Visit (INDEPENDENT_AMBULATORY_CARE_PROVIDER_SITE_OTHER): Payer: BC Managed Care – PPO | Admitting: Vascular Surgery

## 2012-03-22 VITALS — BP 154/88 | HR 78 | Resp 16 | Ht 73.0 in | Wt 185.0 lb

## 2012-03-22 DIAGNOSIS — Z86718 Personal history of other venous thrombosis and embolism: Secondary | ICD-10-CM

## 2012-03-22 DIAGNOSIS — Z48812 Encounter for surgical aftercare following surgery on the circulatory system: Secondary | ICD-10-CM

## 2012-03-22 DIAGNOSIS — I83893 Varicose veins of bilateral lower extremities with other complications: Secondary | ICD-10-CM

## 2012-03-22 DIAGNOSIS — I8010 Phlebitis and thrombophlebitis of unspecified femoral vein: Secondary | ICD-10-CM

## 2012-03-22 NOTE — Progress Notes (Signed)
Subjective:     Patient ID: Sean Riley, male   DOB: 1950-09-25, 61 y.o.   MRN: [id number]  HPI this 61 year old male returns for continued followup regarding his laser ablation of bilateral great saphenous veins. Following his right GSV ablation he had a good closure of the great saphenous vein but there was thrombus protruding into the common femoral vein with no obstruction. We elected to treat him with Coumadin for 3 months. He has had no chest pain dyspnea on exertion hemoptysis or other problems since then. He has been on Coumadin. He does not take aspirin. He has had no swelling in the legs and states the legs feel much better than prior to the ablation procedures.  Past Medical History  Diagnosis Date  . Atrial fibrillation   . HYPERLIPIDEMIA   . 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 Microscopic hematuria   . NONSPECIFIC ABNORMAL ELECTROCARDIOGRAM   . SINUSITIS- ACUTE-NOS   . SMOKER   . GERD (gastroesophageal reflux disease)   . Varicose veins     History  Substance Use Topics  . Smoking status: Current Everyday Smoker -- 1.0 packs/day for 40 years  . Smokeless tobacco: Not on file  . Alcohol Use: 0.0 oz/week     4-6 can of beer daily    Family History  Problem Relation Age of Onset  . Hypertension Mother   . Hypertension Father   . Prostate cancer Father   . Coronary artery disease Maternal Grandmother     MI in 5s  . Arrhythmia Other   . Coronary artery disease Maternal Aunt     MI in 31s    Allergies  Allergen Reactions  . Cephalexin     REACTION: RASH  . Celecoxib     REACTION: GI UPSET  . Codeine     REACTION: GI UPSET  . Nsaids     REACTION: GI UPSET  . Simvastatin     REACTION: mental status changes    Current outpatient prescriptions:budesonide-formoterol (SYMBICORT) 160-4.5 MCG/ACT inhaler, Inhale 2 puffs into the lungs 2 (two) times daily. As needed, Disp: , Rfl: ;  diltiazem (CARDIZEM CD) 180 MG 24 hr capsule, Take 1 capsule  (180 mg total) by mouth daily., Disp: 90 capsule, Rfl: 3;  esomeprazole (NEXIUM) 40 MG capsule, Take 1 capsule (40 mg total) by mouth daily before breakfast., Disp: 90 capsule, Rfl: 3 flecainide (TAMBOCOR) 100 MG tablet, Take 1 tablet (100 mg total) by mouth 2 (two) times daily., Disp: 180 tablet, Rfl: 3;  sildenafil (VIAGRA) 100 MG tablet, Take 1 tablet (100 mg total) by mouth daily as needed., Disp: 10 tablet, Rfl: 5;  warfarin (COUMADIN) 5 MG tablet, Alternate between 1 and 1/2 tab daily or as directed based on PT/INR reading Take 5 mg by mouth., Disp: 90 tablet, Rfl: 0 cefdinir (OMNICEF) 300 MG capsule, as directed., Disp: , Rfl: ;  HYDROcodone-homatropine (HYCODAN) 5-1.5 MG/5ML syrup, as directed., Disp: , Rfl: ;  predniSONE (DELTASONE) 20 MG tablet, as directed., Disp: , Rfl: ;  rosuvastatin (CRESTOR) 20 MG tablet, 20 mg. Has not taken in a month, Disp: , Rfl:   BP 154/88  Pulse 78  Resp 16  Ht 6\' 1"  (1.854 m)  Wt 185 lb (83.915 kg)  BMI 24.41 kg/m2  Body mass index is 24.41 kg/(m^2).          Review of Systems     Objective:   Physical Exam blood pressure 24 radiate heart  rate 78 respirations 16 General well-developed well-nourished male in no apparent stress Lungs no rhonchi or wheezing Right legs with no tenderness along the course of the great saphenous veins and no edema. 3+ dorsalis pedis pulses palpable.  Today I ordered a venous duplex exam of the right leg which are reviewed and interpreted. There is no DVT at this point and no thrombus extending into the femoral vein. There is good closure of the right great saphenous vein.     Assessment:     Successful closure bilateral great saphenous veins. No evidence DVT right leg.    Plan:     Okay to DC Coumadin today and patient will return to see [name] on when necessary basis Will leave chronic aspirin therapy up to t Dr. Alwyn Ren

## 2012-04-21 ENCOUNTER — Encounter: Payer: Self-pay | Admitting: Internal Medicine

## 2012-04-21 ENCOUNTER — Ambulatory Visit (INDEPENDENT_AMBULATORY_CARE_PROVIDER_SITE_OTHER): Payer: BC Managed Care – PPO | Admitting: Internal Medicine

## 2012-04-21 ENCOUNTER — Ambulatory Visit (INDEPENDENT_AMBULATORY_CARE_PROVIDER_SITE_OTHER)
Admission: RE | Admit: 2012-04-21 | Discharge: 2012-04-21 | Disposition: A | Discharge status: Home / Self Care | Payer: BC Managed Care – PPO | Source: Ambulatory Visit | Attending: Internal Medicine | Admitting: Internal Medicine

## 2012-04-21 VITALS — BP 128/80 | HR 85 | Temp 97.9°F | Resp 12 | Ht 72.5 in | Wt 192.6 lb

## 2012-04-21 DIAGNOSIS — R3129 Other microscopic hematuria: Secondary | ICD-10-CM

## 2012-04-21 DIAGNOSIS — R319 Hematuria, unspecified: Secondary | ICD-10-CM

## 2012-04-21 DIAGNOSIS — F17200 Nicotine dependence, unspecified, uncomplicated: Secondary | ICD-10-CM

## 2012-04-21 DIAGNOSIS — Z0000 Encounter for general adult medical examination without abnormal findings: Secondary | ICD-10-CM

## 2012-04-21 LAB — POCT URINALYSIS DIPSTICK
Bilirubin, UA: NEGATIVE
Glucose, UA: NEGATIVE
Leukocytes, UA: NEGATIVE
Nitrite, UA: NEGATIVE

## 2012-04-21 MED ORDER — ZOSTER VACCINE LIVE 19400 UNT/0.65ML ~~LOC~~ SOLR: 0.6500 mL | Freq: Once | SUBCUTANEOUS | Status: DC

## 2012-04-21 NOTE — Addendum Note (Signed)
Addended by: Silvio Pate D on: 04/21/2012 02:16 PM   Modules accepted: Orders

## 2012-04-21 NOTE — Patient Instructions (Addendum)
Preventive Health Care: Eat a low-fat diet with lots of fruits and vegetables, up to 7-9 servings per day.  Consume less than 40 grams of sugar per day from foods & drinks with High Fructose Corn Sugar as #1,2,3 or # 4 on label. Health Care Power of Attorney & Living Will. Complete if not in place ; these place you in charge of your health care decisions. Please think about quitting smoking. Review the risks we discussed. Please call 1-800-QUIT-NOW (202-005-5396) for free smoking cessation counseling.   If you activate My Chart; the results can be released to you as soon as they populate from the lab. If you choose not to use this program; the labs have to be reviewed, copied & mailed   causing a delay in getting the results to you.

## 2012-04-21 NOTE — Progress Notes (Signed)
  Subjective:    Patient ID: Sean Riley, male    DOB: 1950-10-31, 61 y.o.   MRN: [id number]  HPI  Mr Haroon is here for a physical;acute issues are noted below      Review of Systems He underwent catheterization in July; lesions were less than 30% and no stent was necessary. He has been actively treated by his dermatologist Dr. Margo Aye for extensive dermatitis which occurred after exposure to moldy fracture and becoming sweaty bush hogging in grass & weeds . Treatment includes parenteral, topical, and oral steroids with dramatic improvement.  His chart was reviewed; he's had extensive labs checked in the last 6 months. Only a PSA was not done    Objective:   Physical Exam Gen.: Healthy and well-nourished in appearance. Alert, appropriate and cooperative throughout exam. Head: Normocephalic without obvious abnormalities;  no alopecia  Eyes: No corneal or conjunctival inflammation noted. Pupils equal round reactive to light and accommodation. Fundal exam is benign without hemorrhages, exudate, papilledema. Extraocular motion intact. Vision grossly normal. Ears: External  ear exam reveals no significant lesions or deformities. Canals clear .TMs normal. Hearing is grossly normal bilaterally. Nose: External nasal exam reveals no deformity or inflammation. Nasal mucosa are pink and moist. No lesions or exudates noted.  Mouth: Oral mucosa and oropharynx reveal no lesions or exudates, but there is marked erythema of posterior oropharynx. Teeth in good repair. Very hoarse Neck: No deformities, masses, or tenderness noted. Range of motion & Thyroid  normal Lungs: Normal respiratory effort; chest expands symmetrically. Lungs are clear to auscultation without rales, wheezes, or increased work of breathing. Heart: Normal rate and rhythm. Normal S1 and S2. No gallop, click, or rub. S 4 w/o murmur. Abdomen: Bowel sounds normal; abdomen soft and nontender. No masses, organomegaly or hernias  noted. Genitalia/ DRE: Genitalia normal except for left varices . There is mild prostatic hypertrophy which is asymmetric; the left lobe is greater than the right. There is no definite nodularity or induration.                Musculoskeletal/extremities: No deformity or scoliosis noted of  the thoracic or lumbar spine. No clubbing, cyanosis, edema, or deformity noted. Range of motion  normal .Tone & strength  normal.Joints normal. Nail health  good. Vascular: Carotid, radial artery, dorsalis pedis and  posterior tibial pulses are full and equal. No bruits present. Neurologic: Alert and oriented x3. Deep tendon reflexes symmetrical and normal.          Skin:Resolving papular dermatitis;especially over posterior trunk. Some of these lesions blanch with pressure lesions or rashes. Erythema from sunlight exposure over the genital area and groin   Lymph: No cervical, axillary, or inguinal lymphadenopathy present. Psych: Mood and affect are normal. Normally interactive                                                                                        Assessment & Plan:  #1 comprehensive physical exam; no acute findings #2 see Problem List with Assessments & Recommendations Plan: see Orders

## 2012-04-23 LAB — URINE CULTURE
Colony Count: NO GROWTH
Organism ID, Bacteria: NO GROWTH

## 2012-09-24 ENCOUNTER — Other Ambulatory Visit: Payer: Self-pay

## 2012-12-20 ENCOUNTER — Ambulatory Visit (INDEPENDENT_AMBULATORY_CARE_PROVIDER_SITE_OTHER): Payer: BC Managed Care – PPO | Admitting: Internal Medicine

## 2012-12-20 ENCOUNTER — Encounter: Payer: Self-pay | Admitting: Internal Medicine

## 2012-12-20 VITALS — BP 138/98 | HR 78 | Ht 73.0 in | Wt 190.2 lb

## 2012-12-20 DIAGNOSIS — F17200 Nicotine dependence, unspecified, uncomplicated: Secondary | ICD-10-CM

## 2012-12-20 DIAGNOSIS — I4891 Unspecified atrial fibrillation: Secondary | ICD-10-CM

## 2012-12-20 MED ORDER — FLECAINIDE ACETATE 100 MG PO TABS: 100.0000 mg | ORAL_TABLET | Freq: Two times a day (BID) | ORAL | Status: DC

## 2012-12-20 NOTE — Patient Instructions (Addendum)
Your physician has recommended you make the following change in your medication:  1) hold diltiazem for the next 3-4 weeks, then call the office and let Dr. Graciela Husbands know how you doing.  Your physician wants you to follow-up in: 1 year with Dr. Graciela Husbands. You will receive a reminder letter in the mail two months in advance. If you don't receive a letter, please call our office to schedule the follow-up appointment.

## 2012-12-20 NOTE — Assessment & Plan Note (Signed)
Still smoking after all these years

## 2012-12-20 NOTE — Progress Notes (Signed)
Patient Care Team: Pecola Lawless, MD as PCP - General   HPI  Sean Riley is a 62 y.o. male Seen in follow for atrial fibrillation. He continues to take flecainide;  he's been largely asymptomatic without symptomatic recurrences.  There is a concern that he may be allergic to diltiazem as he has a pruritic rash  .    He is smoking still but only down to a pack a day. He is rolling his own cigarettes and he thinks that these are less toxic.  The patient denies SOB, chest pain edema or palpitations. There has been no syncope or presyncope.  He has had two venous laser surgeries of late with a "right femoral flap">>coumadin x 28m  He underwent Myoview scanning 2013 which was abnormal and underwent catheterization which was normal. He remains on flecainide      Past Medical History  Diagnosis Date  . Atrial fibrillation   . HYPERLIPIDEMIA   . 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 Microscopic hematuria   . NONSPECIFIC ABNORMAL ELECTROCARDIOGRAM   . SMOKER   . GERD (gastroesophageal reflux disease)   . Varicose veins     Past Surgical History  Procedure Laterality Date  . Tracheotomy      age 46 for croup  . Knee arthroscopy  2012    Dr Thomasena Edis  . Shoulder arthroscopy  2011    Dr Thomasena Edis  . Coronary angioplasty    . Appendectomy    . Lumbar laminectomy  1987    Dr Cleophas Dunker  . Rotator cuff repair    . Spinal fusion  1986    Dr Elesa Hacker  . Myelogram  2007  . Cardiac catheterization       X3;Dr Graciela Husbands, last in July, 2013  . Colonoscopy  2007     negative    Current Outpatient Prescriptions  Medication Sig Dispense Refill  . budesonide-formoterol (SYMBICORT) 160-4.5 MCG/ACT inhaler Inhale 2 puffs into the lungs 2 (two) times daily. As needed      . diltiazem (CARDIZEM CD) 180 MG 24 hr capsule Take 1 capsule (180 mg total) by mouth daily.  90 capsule  3  . esomeprazole (NEXIUM) 40 MG capsule Take 1 capsule (40 mg total) by mouth daily before breakfast.   90 capsule  3  . flecainide (TAMBOCOR) 100 MG tablet Take 1 tablet (100 mg total) by mouth 2 (two) times daily.  180 tablet  3  . predniSONE (DELTASONE) 20 MG tablet as directed.      . sildenafil (VIAGRA) 100 MG tablet Take 1 tablet (100 mg total) by mouth daily as needed.  10 tablet  5  . zoster vaccine live, PF, (ZOSTAVAX) 40981 UNT/0.65ML injection Inject 19,400 Units into the skin once.  1 each  0   No current facility-administered medications for this visit.    Allergies  Allergen Reactions  . Cephalexin     REACTION: RASH  . Celecoxib     REACTION: GI UPSET  . Codeine     REACTION: GI UPSET  . Nsaids     REACTION: GI UPSET  . Simvastatin     REACTION: mental status changes    Review of Systems negative except from HPI and PMH  Physical Exam BP 138/98  Pulse 78  Ht 6\' 1"  (1.854 m)  Wt 190 lb 3.2 oz (86.274 kg)  BMI 25.1 kg/m2 Well developed and well nourished in no acute distress HENT normal E scleral and  icterus clear Neck Supple JVP flat; carotids brisk and full Clear to ausculation  Regular rate and rhythm, no murmurs gallops or rub Soft with active bowel sounds No clubbing cyanosis none Edema Alert and oriented, grossly normal motor and sensory function Skin Warm and Dry; with excoriations from scratching  ECG demonstrates sinus rhythm at 78 Intervals 16/13/41 Axis is leftward at -15 Right bundle branch block  Assessment and  Plan

## 2012-12-20 NOTE — Assessment & Plan Note (Signed)
Patient has atrial fibrillation currently symptomatically controlled with flecainide. I suspect the rash is related to diltiazem and we will stop it for a couple of weeks. We have reviewed the physiology of an adjunctive rate control with the use of flecainide and the importance of following up so that we can get him started on a different AV nodal blocking agent assuming that the symptoms abate. It is possible that flecainide if the allergen

## 2013-04-24 ENCOUNTER — Ambulatory Visit (INDEPENDENT_AMBULATORY_CARE_PROVIDER_SITE_OTHER): Payer: BC Managed Care – PPO | Admitting: Internal Medicine

## 2013-04-24 ENCOUNTER — Encounter: Payer: Self-pay | Admitting: Internal Medicine

## 2013-04-24 VITALS — BP 146/90 | HR 84 | Temp 98.4°F | Resp 14 | Ht 72.5 in | Wt 189.4 lb

## 2013-04-24 DIAGNOSIS — Z0000 Encounter for general adult medical examination without abnormal findings: Secondary | ICD-10-CM

## 2013-04-24 DIAGNOSIS — R1319 Other dysphagia: Secondary | ICD-10-CM

## 2013-04-24 DIAGNOSIS — F17200 Nicotine dependence, unspecified, uncomplicated: Secondary | ICD-10-CM

## 2013-04-24 DIAGNOSIS — Z23 Encounter for immunization: Secondary | ICD-10-CM

## 2013-04-24 DIAGNOSIS — E785 Hyperlipidemia, unspecified: Secondary | ICD-10-CM

## 2013-04-24 LAB — CBC WITH DIFFERENTIAL/PLATELET
Basophils Absolute: 0 10*3/uL (ref 0.0–0.1)
Basophils Relative: 0.4 % (ref 0.0–3.0)
Eosinophils Absolute: 0.2 10*3/uL (ref 0.0–0.7)
Eosinophils Relative: 2.6 % (ref 0.0–5.0)
HCT: 51 % (ref 39.0–52.0)
Hemoglobin: 17.7 g/dL — ABNORMAL HIGH (ref 13.0–17.0)
Lymphocytes Relative: 15.2 % (ref 12.0–46.0)
Lymphs Abs: 1 10*3/uL (ref 0.7–4.0)
MCHC: 34.6 g/dL (ref 30.0–36.0)
MCV: 97.4 fl (ref 78.0–100.0)
Monocytes Absolute: 0.8 10*3/uL (ref 0.1–1.0)
Monocytes Relative: 11.8 % (ref 3.0–12.0)
Neutro Abs: 4.6 10*3/uL (ref 1.4–7.7)
Neutrophils Relative %: 70 % (ref 43.0–77.0)
Platelets: 260 10*3/uL (ref 150.0–400.0)
RBC: 5.24 Mil/uL (ref 4.22–5.81)
RDW: 13.1 % (ref 11.5–14.6)
WBC: 6.6 10*3/uL (ref 4.5–10.5)

## 2013-04-24 LAB — HEPATIC FUNCTION PANEL
Alkaline Phosphatase: 77 U/L (ref 39–117)
Bilirubin, Direct: 0.2 mg/dL (ref 0.0–0.3)
Total Bilirubin: 0.8 mg/dL (ref 0.3–1.2)
Total Protein: 6.9 g/dL (ref 6.0–8.3)

## 2013-04-24 LAB — LIPID PANEL
HDL: 51.2 mg/dL (ref >39.00)
Total CHOL/HDL Ratio: 4

## 2013-04-24 LAB — TSH: TSH: 1.2 u[IU]/mL (ref 0.35–5.50)

## 2013-04-24 LAB — BASIC METABOLIC PANEL
Calcium: 9.2 mg/dL (ref 8.4–10.5)
Creatinine, Ser: 0.9 mg/dL (ref 0.4–1.5)

## 2013-04-24 LAB — PSA: PSA: 0.46 ng/mL (ref 0.10–4.00)

## 2013-04-24 MED ORDER — SILDENAFIL CITRATE 100 MG PO TABS: 100.0000 mg | ORAL_TABLET | Freq: Every day | ORAL | Status: DC | PRN

## 2013-04-24 NOTE — Patient Instructions (Addendum)
Minimal Blood Pressure Goal= AVERAGE < 140/90;  Ideal is an AVERAGE < 135/85. This AVERAGE should be calculated from @ least 5-7 BP readings taken @ different times of day on different days of week. You should not respond to isolated BP readings , but rather the AVERAGE for that week .Please bring your  blood pressure cuff to office visits to verify that it is reliable.It  can also be checked against the blood pressure device at the pharmacy. Finger or wrist cuffs are not dependable; an arm cuff is. Reflux of gastric acid may be asymptomatic as this may occur mainly during sleep.The triggers for reflux  include stress; the "aspirin family" ; alcohol; peppermint; and caffeine (coffee, tea, cola, and chocolate). The aspirin family would include aspirin and the nonsteroidal agents such as ibuprofen &  Naproxen. Tylenol would not cause reflux. If having symptoms ; food & drink should be avoided for @ least 2 hours before going to bed.  Please think about quitting smoking. Review the risks we discussed. Please call 1-800-QUIT-NOW (6063337968) for free smoking cessation counseling.  If you activate the  My Chart system; lab & Xray results will be released directly  to you as soon as I review & address these through the computer. If you choose not to sign up for My Chart within 36 hours of labs being drawn; results will be reviewed & interpretation added before being copied & mailed, causing a delay in getting the results to you.If you do not receive that report within 7-10 days ,please call. Additionally you can use this system to gain direct  access to your records  if  out of town or @ an office of a  physician who is not in  the My Chart network.  This improves continuity of care & places you in control of your medical record.

## 2013-04-24 NOTE — Progress Notes (Signed)
  Subjective:    Patient ID: Sean Riley, male    DOB: 1951/03/02, 62 y.o.   MRN: [id number]  HPI  Sean Riley is here for a physical;acute issues include frequent dysphagia.     Review of Systems  He denies epistaxis, hemoptysis, hematuria, melena, or rectal bleeding.He has no unexplained weight loss or abdominal pain. He has easy bruising. He has no difficulty stopping bleeding with injury. 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 Specifically he denies chest pain, palpitations, dyspnea, or claudication.  Family history is negative for premature coronary disease. Advanced cholesterol testing reveals his LDL goal is less than 120. He is not on a statin. The calcium channel blocker, diltiazem, is for heart rate control, not blood pressure Significant  memory deficit or myalgias denied.     Objective:   Physical Exam Gen.:  well-nourished in appearance. Alert, appropriate and cooperative throughout exam.  Head: Normocephalic without obvious abnormalities; moustache Eyes: No corneal or conjunctival inflammation noted. Pupils equal round reactive to light and accommodation.  Extraocular motion intact. Vision grossly normal with lenses Ears: External  ear exam reveals no significant lesions or deformities. Canals clear .TMs normal. Hearing is grossly normal bilaterally. Nose: External nasal exam reveals no deformity or inflammation. Nasal mucosa are pink and moist. No lesions or exudates noted.   Mouth: Oropharynx reveals marked erythema w/o exudates. Teeth in good repair. Hoarse Neck: No deformities, masses, or tenderness noted. Range of motion slightly decreased. Thyroid normal. Lungs: Normal respiratory effort; chest expands symmetrically. Lungs are clear to auscultation without rales, wheezes, or increased work of breathing. Heart: Normal rate and rhythm. Accentuated S1; normal S2. No gallop, click, or rub. No murmur. Abdomen: Bowel sounds  normal; abdomen soft and nontender. No masses, organomegaly or hernias noted. Genitalia: Genitalia normal except for left varices & small R granuloma.  Mild asymmetry of the prostate; left lobe slightly larger than right without nodularity or induration.               Musculoskeletal/extremities: No deformity or scoliosis noted of  the thoracic or lumbar spine.  No clubbing, cyanosis, edema, or significant extremity  deformity noted. Range of motion normal .Tone & strength  Normal. Joints normal. Nail health good. Able to lie down & sit up w/o help. Negative SLR bilaterally Vascular: Carotid, radial artery, dorsalis pedis and  posterior tibial pulses are full and equal. No bruits present.Prominent L carotid body Neurologic: Alert and oriented x3. Deep tendon reflexes symmetrical and normal.          Skin: Plaque-like rash of lower extremities. Lymph: No cervical, axillary, or inguinal lymphadenopathy present. Psych: Mood and affect are normal. Normally interactive                                                                                        Assessment & Plan:  #1 comprehensive physical exam; no acute findings he  #2 dysphagia  Plan: see Orders  & Recommendations

## 2013-04-26 ENCOUNTER — Other Ambulatory Visit: Payer: Self-pay | Admitting: Internal Medicine

## 2013-04-26 DIAGNOSIS — E871 Hypo-osmolality and hyponatremia: Secondary | ICD-10-CM

## 2013-05-04 ENCOUNTER — Encounter: Payer: Self-pay | Admitting: Gastroenterology

## 2013-05-30 ENCOUNTER — Ambulatory Visit (INDEPENDENT_AMBULATORY_CARE_PROVIDER_SITE_OTHER): Payer: BC Managed Care – PPO | Admitting: Gastroenterology

## 2013-05-30 ENCOUNTER — Encounter: Payer: Self-pay | Admitting: Gastroenterology

## 2013-05-30 ENCOUNTER — Other Ambulatory Visit: Payer: Self-pay

## 2013-05-30 VITALS — BP 120/72 | HR 80 | Ht 72.0 in | Wt 193.0 lb

## 2013-05-30 DIAGNOSIS — R1310 Dysphagia, unspecified: Secondary | ICD-10-CM

## 2013-05-30 DIAGNOSIS — K219 Gastro-esophageal reflux disease without esophagitis: Secondary | ICD-10-CM

## 2013-05-30 DIAGNOSIS — F17200 Nicotine dependence, unspecified, uncomplicated: Secondary | ICD-10-CM

## 2013-05-30 DIAGNOSIS — J42 Unspecified chronic bronchitis: Secondary | ICD-10-CM

## 2013-05-30 DIAGNOSIS — Z1211 Encounter for screening for malignant neoplasm of colon: Secondary | ICD-10-CM

## 2013-05-30 MED ORDER — MOVIPREP 100 G PO SOLR: 1.0000 | Freq: Once | ORAL | Status: DC

## 2013-05-30 NOTE — Progress Notes (Signed)
History of Present Illness:  This is a 62 year old Caucasian male smoker with COPD, right bundle branch block, and chronic atrial fibrillation not on anticoagulation.  He has chronic GERD and is been on Nexium 40 mg a day for many years.  He continues with acid reflux symptoms and is recently noticed periodic solid food dysphagia in the upper esophageal area.  He has not had to have the Heimlich maneuver performed.  He denies any hepatobiliary or lower gastrointestinal complaints.  Last colonoscopy was 10 years ago.  Family history is noncontributory.  He follows a regular diet denies any food intolerances or weight loss.  He says his exercise tolerance is good he walks one to 2 miles a day.  He possibly has sleep apnea.  He had recent complete physical exam with Dr. Alwyn Ren this gave him a fairly clean bill of health.  Patient denies cough, sputum production, or hemoptysis.  Chest x-ray last year showed hyperinflation and changes of chronic bronchitis.  I have reviewed this patient's present history, medical and surgical past history, allergies and medications.     ROS:   All systems were reviewed and are negative unless otherwise stated in the HPI.    Physical Exam: Blood pressure 146/90 pulse 84 and regular and weight and 89.  Large thick posterior airway noted. General well developed well nourished patient in no acute distress, appearing their stated age Eyes PERRLA, no icterus, fundoscopic exam per opthamologist Skin no lesions noted Neck supple, no adenopathy, no thyroid enlargement, no tenderness Chest rhonchi and expiratory wheezes noted in both lung fields anteriorly and posteriorly. Heart no significant murmurs, gallops or rubs noted Abdomen no hepatosplenomegaly masses or tenderness, BS normal.  Extremities no acute joint lesions, edema, phlebitis or evidence of cellulitis. Neurologic patient oriented x 3, cranial nerves intact, no focal neurologic deficits noted. Psychological mental  status normal and normal affect.  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  He also needs colonoscopy screening per his age and last colonoscopy exam 10 years ago.  I have reviewed acid reflux management with the patient, endoscopy and esophageal dilatation.  We have scheduled his endoscopy at the time of his colonoscopy.  He may need a stronger PPI agent long-term such as Dexilant.  Patient has no intent on quitting his cigarette smoking.  He says he smokes approximately one pack per day.

## 2013-05-30 NOTE — Patient Instructions (Signed)
You have been scheduled for an endoscopy and colonoscopy with propofol. Please follow the written instructions given to you at your visit today. Please pick up your prep at the pharmacy within the next 1-3 days. If you use inhalers (even only as needed), please bring them with you on the day of your procedure. Your physician has requested that you go to www.startemmi.com and enter the access code given to you at your visit today. This web site gives a general overview about your procedure. However, you should still follow specific instructions given to you by our office regarding your preparation for the procedure. CC:  Marga Melnick MD

## 2013-06-01 ENCOUNTER — Encounter: Payer: Self-pay | Admitting: Gastroenterology

## 2013-06-12 ENCOUNTER — Encounter: Payer: BC Managed Care – PPO | Admitting: Gastroenterology

## 2013-06-26 ENCOUNTER — Encounter: Payer: Self-pay | Admitting: Gastroenterology

## 2013-06-26 ENCOUNTER — Ambulatory Visit (AMBULATORY_SURGERY_CENTER): Payer: BC Managed Care – PPO | Admitting: Gastroenterology

## 2013-06-26 VITALS — BP 140/91 | HR 79 | Temp 97.7°F | Resp 21 | Ht 72.0 in | Wt 193.0 lb

## 2013-06-26 DIAGNOSIS — R1310 Dysphagia, unspecified: Secondary | ICD-10-CM

## 2013-06-26 DIAGNOSIS — Z1211 Encounter for screening for malignant neoplasm of colon: Secondary | ICD-10-CM

## 2013-06-26 DIAGNOSIS — K219 Gastro-esophageal reflux disease without esophagitis: Secondary | ICD-10-CM

## 2013-06-26 DIAGNOSIS — D126 Benign neoplasm of colon, unspecified: Secondary | ICD-10-CM

## 2013-06-26 DIAGNOSIS — K5730 Diverticulosis of large intestine without perforation or abscess without bleeding: Secondary | ICD-10-CM

## 2013-06-26 MED ORDER — SODIUM CHLORIDE 0.9 % IV SOLN: 500.0000 mL | INTRAVENOUS | Status: DC

## 2013-06-26 NOTE — Progress Notes (Signed)
Called to room to assist during endoscopic procedure.  Patient ID and intended procedure confirmed with present staff. Received instructions for my participation in the procedure from the performing physician.  

## 2013-06-26 NOTE — Op Note (Signed)
[facility] [address] [place], [address]   COLONOSCOPY PROCEDURE REPORT  PATIENT: Sean, Riley  MR#: [id number] BIRTHDATE: Oct 07, 1950 , 62  yrs. old GENDER: Male ENDOSCOPIST: Mardella Layman, MD, [facility] REFERRED XB:[id number] Alwyn Ren, M.D. PROCEDURE DATE:  06/26/2013 PROCEDURE:   Colonoscopy with snare polypectomy First Screening Colonoscopy - Avg.  risk and is 50 yrs.  old or older Yes.  Prior Negative Screening - Now for repeat screening. N/A  History of Adenoma - Now for follow-up colonoscopy & has been > or = to 3 yrs.  N/A  Polyps Removed Today? Yes. ASA CLASS:   Class II INDICATIONS:average risk screening. MEDICATIONS: Propofol (Diprivan) 380 mg IV  DESCRIPTION OF PROCEDURE:   After the risks benefits and alternatives of the procedure were thoroughly explained, informed consent was obtained.  A digital rectal exam revealed no abnormalities of the rectum.   The LB [id number]  endoscope was introduced through the anus and advanced to the cecum, which was identified by both the appendix and ileocecal valve. No adverse events experienced.   The quality of the prep was excellent, using MoviPrep  The instrument was then slowly withdrawn as the colon was fully examined.      COLON FINDINGS: Two polypoid shaped sessile polyps ranging between 5-38mm in size were found at the hepatic flexure.  A polypectomy was performed using snare cautery.  The resection was complete and the polyp tissue was partially retrieved.   Two smooth sessile polyps ranging between 5-62mm in size with mucous caps were found at the splenic flexure.This mucoid polyp was broad and flat and piecemeal removed.  A polypectomy was performed using snare cautery.  The resection was complete and the polyp tissue was partially retrieved. Small hyperplastic rectal nodules not biopsied. Retroflexed views revealed no abnormalities.Diverticulosis in sigmoid area noted. Hard exam per  redundant colon, The time to cecum=4 minutes 25 seconds.  Withdrawal time=20 minutes 21 seconds. The scope was withdrawn and the procedure completed. COMPLICATIONS: There were no complications.  ENDOSCOPIC IMPRESSION: 1.   Two sessile polyps ranging between 5-75mm in size were found at the hepatic flexure; polypectomy was performed using snare cautery 2.   Two sessile polyps ranging between 5-58mm in size were found at the splenic flexure; polypectomy was performed using snare cautery ..one broad mucoid polyp piecemeal removed. 3. Diverticulosis coli RECOMMENDATIONS: 1.  Hold aspirin, aspirin products, and anti-inflammatory medication for 2 weeks. 2.  Await biopsy results 3.  Repeat Colonoscopy in 1 year.   eSigned:  Mardella Layman, MD, [facility] 06/26/2013 10:52 AM   cc:   PATIENT NAME:  Sean, Riley MR#: [id number]

## 2013-06-26 NOTE — Progress Notes (Signed)
Procedure ends, to recovery, report given and VSS. 

## 2013-06-26 NOTE — Patient Instructions (Addendum)

## 2013-06-26 NOTE — Op Note (Signed)
[facility] [address] [place], [address]   ENDOSCOPY PROCEDURE REPORT  PATIENT: Sean Riley, Sean Riley  MR#: [id number] BIRTHDATE: Oct 19, 1950 , 62  yrs. old GENDER: Male ENDOSCOPIST:David Hale Bogus, MD, [facility] REFERRED BY: Marga Melnick, M.D. PROCEDURE DATE:  06/26/2013 PROCEDURE:   EGD w/ biopsy for H.pylori ASA CLASS:    Class II INDICATIONS: Chest pain and History of reflux esophagitis. MEDICATION: There was residual sedation effect present from prior procedure and Propofol (Diprivan) 120 mg IV TOPICAL ANESTHETIC:  DESCRIPTION OF PROCEDURE:   After the risks and benefits of the procedure were explained, informed consent was obtained.  The LB [id number]  endoscope was introduced through the mouth  and advanced to the second portion of the duodenum .  The instrument was slowly withdrawn as the mucosa was fully examined.      DUODENUM: The duodenal mucosa showed no abnormalities.  STOMACH: There was mild antral gastropathy noted.  Cold forcep biopsies were taken at the antrum for CLO testing. Large prominant rugae noted.   Retroflexed views revealed no abnormalities.    The scope was then withdrawn from the patient and the procedure completed.  ESOPHAGUS: Normal mucosa and no Barrettt's mucosa noted.,no stricture or prominant hiatial hernia.  COMPLICATIONS: There were no complications.   ENDOSCOPIC IMPRESSION: 1.   The duodenal mucosa showed no abnormalities 2.   There was mild antral gastropathy noted [T2] .Marland KitchenMarland KitchenR/O H.pylori 3.   Clinically with treated GERD   RECOMMENDATIONS: 1.  Continue PPI 2.  Continue current meds 3.  Await biopsy results 4.  Rx CLO if positive    _______________________________ eSigned:  Mardella Layman, MD, [facility] 06/26/2013 11:00 AM      PATIENT NAME:  Sean Riley, Sean Riley MR#: [id number]

## 2013-06-26 NOTE — Progress Notes (Signed)
Patient did not experience any of the following events: a burn prior to discharge; a fall within the facility; wrong site/side/patient/procedure/implant event; or a hospital transfer or hospital admission upon discharge from the facility. (G8907) Patient did not have preoperative order for IV antibiotic SSI prophylaxis. (G8918)  

## 2013-06-27 ENCOUNTER — Encounter: Payer: Self-pay | Admitting: Gastroenterology

## 2013-06-27 ENCOUNTER — Telehealth: Payer: Self-pay

## 2013-06-27 LAB — HELICOBACTER PYLORI SCREEN-BIOPSY: UREASE: NEGATIVE

## 2013-06-27 NOTE — Telephone Encounter (Signed)
  Follow up Call-  Call back number 06/26/2013  Post procedure Call Back phone  # [phone]  Permission to leave phone message Yes     Patient questions:  Do you have a fever, pain , or abdominal swelling? no Pain Score  0 *  Have you tolerated food without any problems? yes  Have you been able to return to your normal activities? yes  Do you have any questions about your discharge instructions: Diet   no Medications  no Follow up visit  no  Do you have questions or concerns about your Care? no  Actions: * If pain score is 4 or above: No action needed, pain <4.

## 2013-06-30 ENCOUNTER — Encounter: Payer: Self-pay | Admitting: Gastroenterology

## 2013-10-14 ENCOUNTER — Emergency Department (HOSPITAL_COMMUNITY): Payer: BC Managed Care – PPO

## 2013-10-14 ENCOUNTER — Encounter (HOSPITAL_COMMUNITY): Payer: Self-pay | Admitting: Emergency Medicine

## 2013-10-14 ENCOUNTER — Inpatient Hospital Stay (HOSPITAL_COMMUNITY)
Admission: EM | Admit: 2013-10-14 | Discharge: 2013-10-16 | DRG: 194 | Disposition: A | Discharge status: Home / Self Care | Payer: BC Managed Care – PPO | Attending: Internal Medicine | Admitting: Internal Medicine

## 2013-10-14 DIAGNOSIS — F1010 Alcohol abuse, uncomplicated: Secondary | ICD-10-CM | POA: Diagnosis present

## 2013-10-14 DIAGNOSIS — Z8249 Family history of ischemic heart disease and other diseases of the circulatory system: Secondary | ICD-10-CM

## 2013-10-14 DIAGNOSIS — E236 Other disorders of pituitary gland: Secondary | ICD-10-CM | POA: Diagnosis present

## 2013-10-14 DIAGNOSIS — Z23 Encounter for immunization: Secondary | ICD-10-CM

## 2013-10-14 DIAGNOSIS — E785 Hyperlipidemia, unspecified: Secondary | ICD-10-CM | POA: Diagnosis present

## 2013-10-14 DIAGNOSIS — J189 Pneumonia, unspecified organism: Principal | ICD-10-CM | POA: Diagnosis present

## 2013-10-14 DIAGNOSIS — J449 Chronic obstructive pulmonary disease, unspecified: Secondary | ICD-10-CM | POA: Diagnosis present

## 2013-10-14 DIAGNOSIS — E869 Volume depletion, unspecified: Secondary | ICD-10-CM | POA: Diagnosis present

## 2013-10-14 DIAGNOSIS — K219 Gastro-esophageal reflux disease without esophagitis: Secondary | ICD-10-CM | POA: Diagnosis present

## 2013-10-14 DIAGNOSIS — J4489 Other specified chronic obstructive pulmonary disease: Secondary | ICD-10-CM | POA: Diagnosis present

## 2013-10-14 DIAGNOSIS — I4891 Unspecified atrial fibrillation: Secondary | ICD-10-CM | POA: Diagnosis present

## 2013-10-14 DIAGNOSIS — F17200 Nicotine dependence, unspecified, uncomplicated: Secondary | ICD-10-CM | POA: Diagnosis present

## 2013-10-14 DIAGNOSIS — E871 Hypo-osmolality and hyponatremia: Secondary | ICD-10-CM

## 2013-10-14 DIAGNOSIS — Z803 Family history of malignant neoplasm of breast: Secondary | ICD-10-CM

## 2013-10-14 LAB — BASIC METABOLIC PANEL
BUN: 7 mg/dL (ref 6–23)
CALCIUM: 9.1 mg/dL (ref 8.4–10.5)
CO2: 22 mEq/L (ref 19–32)
Chloride: 92 mEq/L — ABNORMAL LOW (ref 96–112)
Creatinine, Ser: 0.79 mg/dL (ref 0.50–1.35)
GFR calc Af Amer: >90 mL/min (ref >90)
Glucose, Bld: 101 mg/dL — ABNORMAL HIGH (ref 70–99)
Potassium: 4.1 mEq/L (ref 3.7–5.3)
SODIUM: 130 meq/L — AB (ref 137–147)

## 2013-10-14 LAB — CBC WITH DIFFERENTIAL/PLATELET
BASOS PCT: 0 % (ref 0–1)
Basophils Absolute: 0 10*3/uL (ref 0.0–0.1)
EOS ABS: 0.2 10*3/uL (ref 0.0–0.7)
Eosinophils Relative: 2 % (ref 0–5)
HCT: 50.3 % (ref 39.0–52.0)
Hemoglobin: 18.5 g/dL — ABNORMAL HIGH (ref 13.0–17.0)
Lymphocytes Relative: 9 % — ABNORMAL LOW (ref 12–46)
Lymphs Abs: 0.8 10*3/uL (ref 0.7–4.0)
MCH: 35 pg — ABNORMAL HIGH (ref 26.0–34.0)
MCHC: 36.8 g/dL — AB (ref 30.0–36.0)
MCV: 95.1 fL (ref 78.0–100.0)
Monocytes Absolute: 1.1 10*3/uL — ABNORMAL HIGH (ref 0.1–1.0)
Monocytes Relative: 11 % (ref 3–12)
NEUTROS PCT: 78 % — AB (ref 43–77)
Neutro Abs: 7.7 10*3/uL (ref 1.7–7.7)
PLATELETS: 203 10*3/uL (ref 150–400)
RBC: 5.29 MIL/uL (ref 4.22–5.81)
RDW: 13.1 % (ref 11.5–15.5)
WBC: 9.8 10*3/uL (ref 4.0–10.5)

## 2013-10-14 LAB — TROPONIN I

## 2013-10-14 LAB — I-STAT TROPONIN, ED: TROPONIN I, POC: 0 ng/mL (ref 0.00–0.08)

## 2013-10-14 LAB — PRO B NATRIURETIC PEPTIDE: Pro B Natriuretic peptide (BNP): 73.7 pg/mL (ref 0–125)

## 2013-10-14 MED ORDER — IOHEXOL 350 MG/ML SOLN
100.0000 mL | Freq: Once | INTRAVENOUS | Status: AC | PRN
  Administered 2013-10-14: 100 mL via INTRAVENOUS

## 2013-10-14 MED ORDER — DEXTROSE 5 % IV SOLN
500.0000 mg | INTRAVENOUS | Status: DC
  Administered 2013-10-14 – 2013-10-15 (×2): 500 mg via INTRAVENOUS
  Filled 2013-10-14 (×2): qty 500

## 2013-10-14 MED ORDER — DEXTROSE 5 % IV SOLN
1.0000 g | INTRAVENOUS | Status: DC
  Administered 2013-10-14: 1 g via INTRAVENOUS
  Filled 2013-10-14: qty 10

## 2013-10-14 MED ORDER — PANTOPRAZOLE SODIUM 40 MG PO TBEC
40.0000 mg | DELAYED_RELEASE_TABLET | Freq: Two times a day (BID) | ORAL | Status: DC
  Administered 2013-10-14 – 2013-10-16 (×4): 40 mg via ORAL
  Filled 2013-10-14 (×4): qty 1

## 2013-10-14 MED ORDER — ACETAMINOPHEN 325 MG PO TABS
650.0000 mg | ORAL_TABLET | Freq: Four times a day (QID) | ORAL | Status: DC | PRN
Start: 2013-10-14 — End: 2013-10-16

## 2013-10-14 MED ORDER — OXYCODONE-ACETAMINOPHEN 5-325 MG PO TABS: 1.0000 | ORAL_TABLET | ORAL | Status: DC | PRN

## 2013-10-14 MED ORDER — CLOBETASOL PROPIONATE 0.05 % EX CREA
1.0000 "application " | TOPICAL_CREAM | Freq: Every day | CUTANEOUS | Status: DC
  Administered 2013-10-14 – 2013-10-15 (×2): 1 via TOPICAL
  Filled 2013-10-14: qty 15

## 2013-10-14 MED ORDER — DEXTROSE 5 % IV SOLN
1.0000 g | INTRAVENOUS | Status: DC
  Administered 2013-10-15: 1 g via INTRAVENOUS
  Filled 2013-10-14 (×2): qty 10

## 2013-10-14 MED ORDER — PNEUMOCOCCAL VAC POLYVALENT 25 MCG/0.5ML IJ INJ
0.5000 mL | INJECTION | INTRAMUSCULAR | Status: DC
  Filled 2013-10-14: qty 0.5

## 2013-10-14 MED ORDER — PREDNISONE 50 MG PO TABS
50.0000 mg | ORAL_TABLET | Freq: Every day | ORAL | Status: DC
  Administered 2013-10-14 – 2013-10-16 (×2): 50 mg via ORAL
  Filled 2013-10-14 (×4): qty 1

## 2013-10-14 MED ORDER — ENOXAPARIN SODIUM 40 MG/0.4ML ~~LOC~~ SOLN
40.0000 mg | SUBCUTANEOUS | Status: DC
  Administered 2013-10-15: 40 mg via SUBCUTANEOUS
  Filled 2013-10-14 (×4): qty 0.4

## 2013-10-14 MED ORDER — AZITHROMYCIN 500 MG PO TABS: 500.0000 mg | ORAL_TABLET | ORAL | Status: DC

## 2013-10-14 MED ORDER — FLUTICASONE PROPIONATE 50 MCG/ACT NA SUSP
2.0000 | Freq: Every day | NASAL | Status: DC
  Administered 2013-10-14 – 2013-10-15 (×2): 2 via NASAL
  Filled 2013-10-14 (×3): qty 16

## 2013-10-14 MED ORDER — LEVALBUTEROL HCL 0.63 MG/3ML IN NEBU: 0.6300 mg | INHALATION_SOLUTION | RESPIRATORY_TRACT | Status: DC | PRN

## 2013-10-14 MED ORDER — DM-GUAIFENESIN ER 30-600 MG PO TB12
2.0000 | ORAL_TABLET | Freq: Two times a day (BID) | ORAL | Status: DC
  Administered 2013-10-14 – 2013-10-15 (×2): 2 via ORAL
  Filled 2013-10-14 (×4): qty 2

## 2013-10-14 MED ORDER — MORPHINE SULFATE 4 MG/ML IJ SOLN
4.0000 mg | Freq: Once | INTRAMUSCULAR | Status: AC
  Administered 2013-10-14: 4 mg via INTRAVENOUS
  Filled 2013-10-14: qty 1

## 2013-10-14 MED ORDER — FLECAINIDE ACETATE 100 MG PO TABS
100.0000 mg | ORAL_TABLET | Freq: Two times a day (BID) | ORAL | Status: DC
  Administered 2013-10-14 – 2013-10-16 (×4): 100 mg via ORAL
  Filled 2013-10-14 (×5): qty 1

## 2013-10-14 MED ORDER — ONDANSETRON HCL 4 MG/2ML IJ SOLN: 4.0000 mg | Freq: Four times a day (QID) | INTRAMUSCULAR | Status: DC | PRN

## 2013-10-14 MED ORDER — BUDESONIDE-FORMOTEROL FUMARATE 160-4.5 MCG/ACT IN AERO
2.0000 | INHALATION_SPRAY | Freq: Two times a day (BID) | RESPIRATORY_TRACT | Status: DC
  Administered 2013-10-14 – 2013-10-15 (×3): 2 via RESPIRATORY_TRACT
  Filled 2013-10-14: qty 6

## 2013-10-14 MED ORDER — BISACODYL 5 MG PO TBEC: 10.0000 mg | DELAYED_RELEASE_TABLET | Freq: Every day | ORAL | Status: DC | PRN

## 2013-10-14 MED ORDER — SODIUM CHLORIDE 0.9 % IV SOLN
INTRAVENOUS | Status: DC
  Administered 2013-10-14 – 2013-10-16 (×3): via INTRAVENOUS

## 2013-10-14 NOTE — ED Provider Notes (Signed)
CSN: [id number]     Arrival date & time 10/14/13  1424 History   First MD Initiated Contact with Patient 10/14/13 1501     Chief Complaint  Patient presents with  . Chest Pain  . Shortness of Breath     (Consider location/radiation/quality/duration/timing/severity/associated sxs/prior Treatment) Patient is a 63 y.o. male presenting with chest pain and shortness of breath. The history is provided by the patient and the spouse.  Chest Pain Associated symptoms: shortness of breath   Shortness of Breath Associated symptoms: chest pain    patient here complaining of shortness of breath and chest pain x24 hours. Symptoms are worse with exertion better with rest. Discomfort has been constant. He has noted some associated cough without fever or chills. Patient's cough has been nonproductive. Denies any orthopnea. No prior history of same. Patient states that he had a cardiac catheterization approximately 2 years ago that was negative for obstructive coronary artery disease. Review the patient's chart shows that he had a heart catheterization in June of 2013 which showed normal LV function and nonobstructive disease. No treatment used for his current symptoms prior to arrival. Denies any diarrhea. No emesis noted. Denies abdominal pain. Denies any syncope or near-syncope.  Past Medical History  Diagnosis Date  . Atrial fibrillation   . HYPERLIPIDEMIA   . 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 Microscopic hematuria     negative cystoscopy  . NONSPECIFIC ABNORMAL ELECTROCARDIOGRAM   . SMOKER   . GERD (gastroesophageal reflux disease)   . Varicose veins    Past Surgical History  Procedure Laterality Date  . Tracheotomy      age 73 for croup  . Knee arthroscopy  2012    Dr Theda Sers  . Shoulder arthroscopy  2011    Dr Theda Sers  . Coronary angioplasty      04/24/13 : he denies angioplasty  . Appendectomy    . Lumbar laminectomy  1987    Dr Durward Fortes  . Rotator cuff repair    .  Spinal fusion  1986    Dr Rolin Barry  . Myelogram  2007  . Cardiac catheterization       X3;Dr Caryl Comes, last in July, 2013  . Colonoscopy  2007     negative; Dr Sharlett Iles  . Cystoscopy  1978    Dr Hartley Barefoot   Family History  Problem Relation Age of Onset  . Hypertension Mother   . Hypertension Father   . Benign prostatic hyperplasia Father     S/P TURP  . Heart attack Maternal Grandmother     MI in 36s  . Breast cancer Maternal Grandmother   . Arrhythmia Brother      X 2  . Heart attack Maternal Aunt     MI in 73s  . Stroke Neg Hx   . Diabetes Neg Hx   . Colon cancer Neg Hx   . Colon polyps Neg Hx   . Stomach cancer Neg Hx   . Rectal cancer Neg Hx   . Esophageal cancer Neg Hx    History  Substance Use Topics  . Smoking status: Current Every Day Smoker -- 1.00 packs/day for 30 years    Types: Cigarettes  . Smokeless tobacco: Not on file  . Alcohol Use: 0.0 oz/week     Comment: 6-8 cans of beer daily    Review of Systems  Respiratory: Positive for shortness of breath.   Cardiovascular: Positive for chest pain.  All other systems  reviewed and are negative.      Allergies  Simvastatin; Celecoxib; Codeine; and Nsaids  Home Medications   Current Outpatient Rx  Name  Route  Sig  Dispense  Refill  . budesonide-formoterol (SYMBICORT) 160-4.5 MCG/ACT inhaler   Inhalation   Inhale 2 puffs into the lungs 2 (two) times daily. As needed         . clobetasol cream (TEMOVATE) 0.05 %   Topical   Apply 1 application topically daily. As needed         . esomeprazole (NEXIUM) 40 MG capsule   Oral   Take 1 capsule (40 mg total) by mouth daily before breakfast.         . flecainide (TAMBOCOR) 100 MG tablet   Oral   Take 100 mg by mouth 2 (two) times daily.          . Homeopathic Products (SINUS MEDICINE PO)   Oral   Take 1 tablet by mouth daily as needed (for runny nose).         . mometasone (NASONEX) 50 MCG/ACT nasal spray   Nasal   Place 2 sprays into  the nose daily as needed (for allergies).          BP 146/83  Pulse 67  Temp(Src) 97.8 F (36.6 C) (Oral)  Resp 22  SpO2 94% Physical Exam  Nursing note and vitals reviewed. Constitutional: He is oriented to person, place, and time. He appears well-developed and well-nourished.  Non-toxic appearance. No distress.  HENT:  Head: Normocephalic and atraumatic.  Eyes: Conjunctivae, EOM and lids are normal. Pupils are equal, round, and reactive to light.  Neck: Normal range of motion. Neck supple. No tracheal deviation present. No mass present.  Cardiovascular: Normal rate, regular rhythm and normal heart sounds.  Exam reveals no gallop.   No murmur heard. Pulmonary/Chest: Effort normal. No stridor. No respiratory distress. He has decreased breath sounds. He has no wheezes. He has rhonchi in the right middle field. He has no rales.  Abdominal: Soft. Normal appearance and bowel sounds are normal. He exhibits no distension. There is no tenderness. There is no rebound and no CVA tenderness.  Musculoskeletal: Normal range of motion. He exhibits no edema and no tenderness.  Neurological: He is alert and oriented to person, place, and time. He has normal strength. No cranial nerve deficit or sensory deficit. GCS eye subscore is 4. GCS verbal subscore is 5. GCS motor subscore is 6.  Skin: Skin is warm and dry. No abrasion and no rash noted.  Psychiatric: He has a normal mood and affect. His speech is normal and behavior is normal.    ED Course  Procedures (including critical care time) Labs Review Labs Reviewed  BASIC METABOLIC PANEL  CBC WITH DIFFERENTIAL  TROPONIN I  PRO B NATRIURETIC PEPTIDE  [place], ED   Imaging Review Dg Chest Port 1 View  10/14/2013   CLINICAL DATA:  Chest pain.  EXAM: PORTABLE CHEST - 1 VIEW  COMPARISON:  Chest x-ray 04/21/2012.  FINDINGS: Lung volumes are low. There is an ill-defined opacity in the lower left hemithorax which partially obscures the lateral  left hemidiaphragm, concerning for developing airspace consolidation. Right lung is clear. No evidence of pulmonary edema. No definite pleural effusions. Heart size is normal. The patient is rotated to the right on today's exam, resulting in distortion of the mediastinal contours and reduced diagnostic sensitivity and specificity for mediastinal pathology.  IMPRESSION: 1. New airspace consolidation in the left  lower lobe concerning for potential area of developing pneumonia, or, in the appropriate clinical setting, hemorrhage from pulmonary infarction. Clinical correlation is recommended, and if there is no leukocytosis or signs or symptoms of infection, consideration for further evaluation with PE protocol CT scan may be warranted.   Electronically Signed   By: Vinnie Langton M.D.   On: 10/14/2013 15:09     Date: 10/14/2013  Rate: 70  Rhythm: normal sinus rhythm  QRS Axis: normal  Intervals: normal  ST/T Wave abnormalities: nonspecific ST changes  Conduction Disutrbances:right bundle branch block  Narrative Interpretation:   Old EKG Reviewed: unchanged    MDM   Final diagnoses:  None   Pt seen by cardiology and will be admitted for evaluation of chest pain     Leota Jacobsen, MD 10/23/13 [phone]

## 2013-10-14 NOTE — ED Notes (Signed)
Pt presents to department for evaluation of diffuse chest pain and SOB. Onset 9:00pm last night. Pt states 7/10 chest pain upon arrival. 88%RA. Pt is conscious alert and oriented x4. Smokes 1.5 packs a day, also states 6-8 beers a day.

## 2013-10-14 NOTE — H&P (Signed)
[facility] History and Physical  Sean Riley VQM:[id number] DOB: Apr 01, 1951 DOA: 10/14/2013  Referring physician: EDP PCP: Unice Cobble, MD   Chief Complaint: Chest tightness and shortness of breath  HPI: Sean Riley is a 63 y.o. male smoker with a history of itch or fibrillation, GERD and hyperlipidemia who presents with above complaints. He states that he has had nasal congestion/URI symptoms for the past 2-3 weeks about that last night developed chest tightness worse with inspiration as well as shortness of breath. He denies leg swelling and no orthopnea or PND. He admits to a nonproductive cough. He was seen in the ED-chest x-ray done showed new airspace consolidation in left lower lobe concerning for pneumonia, CT angiogram was negative for PE, revealed bilateral lower lobe parenchymal consolidation compatible with pneumonia, with underlying COPD noted. He was started on empiric antibiotics and is admitted for further evaluation and management. His BNP was within normal limits at at 73.7    Review of Systems The patient denies anorexia, fever, weight loss,, vision loss, decreased hearing, syncope, peripheral edema, balance deficits, hemoptysis, abdominal pain, melena, hematochezia, severe indigestion/heartburn, hematuria, incontinence, genital sores, muscle weakness, suspicious skin lesions, transient blindness, difficulty walking, depression, unusual weight change, abnormal bleeding, enlarged lymph nodes, angioedema, and breast masses.   Past Medical History  Diagnosis Date  . Atrial fibrillation   . HYPERLIPIDEMIA   . 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 Microscopic hematuria     negative cystoscopy  . NONSPECIFIC ABNORMAL ELECTROCARDIOGRAM   . SMOKER   . GERD (gastroesophageal reflux disease)   . Varicose veins    Past Surgical History  Procedure Laterality Date  . Tracheotomy      age 25 for croup  . Knee arthroscopy  2012    Dr Theda Sers  .  Shoulder arthroscopy  2011    Dr Theda Sers  . Coronary angioplasty      04/24/13 : he denies angioplasty  . Appendectomy    . Lumbar laminectomy  1987    Dr Durward Fortes  . Rotator cuff repair    . Spinal fusion  1986    Dr Rolin Barry  . Myelogram  2007  . Cardiac catheterization       X3;Dr Caryl Comes, last in July, 2013  . Colonoscopy  2007     negative; Dr Sharlett Iles  . Cystoscopy  1978    Dr Hartley Barefoot   Social History:  reports that he has been smoking Cigarettes.  He has a 30 pack-year smoking history. He does not have any smokeless tobacco history on file. He reports that he drinks alcohol. He reports that he does not use illicit drugs.  Allergies  Allergen Reactions  . Simvastatin Other (See Comments)    REACTION: mental status changes  . Celecoxib Other (See Comments)    REACTION: GI UPSET AND INFLAMMATION  . Codeine     REACTION: GI UPSET AND INFLAMMATION  . Nsaids Other (See Comments)    REACTION: GI UPSET AND INFLAMMATION    Family History  Problem Relation Age of Onset  . Hypertension Mother   . Hypertension Father   . Benign prostatic hyperplasia Father     S/P TURP  . Heart attack Maternal Grandmother     MI in 23s  . Breast cancer Maternal Grandmother   . Arrhythmia Brother      X 2  . Heart attack Maternal Aunt     MI in 52s  . Stroke Neg Hx   .  Diabetes Neg Hx   . Colon cancer Neg Hx   . Colon polyps Neg Hx   . Stomach cancer Neg Hx   . Rectal cancer Neg Hx   . Esophageal cancer Neg Hx      Prior to Admission medications   Medication Sig Start Date End Date Taking? Authorizing Provider  budesonide-formoterol (SYMBICORT) 160-4.5 MCG/ACT inhaler Inhale 2 puffs into the lungs 2 (two) times daily. As needed   Yes Historical Provider, MD  clobetasol cream (TEMOVATE) 2.35 % Apply 1 application topically daily. As needed 10/26/12  Yes Historical Provider, MD  esomeprazole (NEXIUM) 40 MG capsule Take 1 capsule (40 mg total) by mouth daily before breakfast. 05/30/13   Yes Sable Feil, MD  flecainide (TAMBOCOR) 100 MG tablet Take 100 mg by mouth 2 (two) times daily.  04/10/13  Yes Historical Provider, MD  Homeopathic Products (SINUS MEDICINE PO) Take 1 tablet by mouth daily as needed (for runny nose).   Yes Historical Provider, MD  mometasone (NASONEX) 50 MCG/ACT nasal spray Place 2 sprays into the nose daily as needed (for allergies).   Yes Historical Provider, MD   Physical Exam: Filed Vitals:   10/14/13 1922  BP: 160/95  Pulse: 75  Temp: 98 F (36.7 C)  Resp: 18    BP 160/95  Pulse 75  Temp(Src) 98 F (36.7 C) (Oral)  Resp 18  Ht 6\' 1"  (1.854 m)  Wt 85.911 kg (189 lb 6.4 oz)  BMI 24.99 kg/m2  SpO2 96% Constitutional: Vital signs reviewed.  Patient is a well-developed and well-nourished  in no acute distress and cooperative with exam. Alert and oriented x3.  Head: Normocephalic and atraumatic Mouth: no erythema or exudates, MMM Eyes: PERRL, EOMI, conjunctivae normal, No scleral icterus.  Neck: Supple, Trachea midline normal ROM, No JVD, mass, thyromegaly, or carotid bruit present.  Cardiovascular: RRR, S1 normal, S2 normal, no MRG, pulses symmetric and intact bilaterally Pulmonary/Chest: normal respiratory effort, bibasilar crackles and rhonchi, no wheezes Abdominal: Soft. Non-tender, non-distended, bowel sounds are normal, no masses, organomegaly, or guarding present.  GU: no CVA tenderness  extremities: No cyanosis and no edema  Neurological: A&O x3, Strength is normal and symmetric bilaterally, cranial nerve II-XII are grossly intact, no focal motor deficit, sensory intact to light touch bilaterally.  Skin: Warm, dry and intact. No rash, cyanosis, or clubbing.  Psychiatric: Normal mood and affect. speech and behavior is normal. Judgment and thought content normal. Cognition and memory are normal.                Labs on Admission:  Basic Metabolic Panel:  Recent Labs Lab 10/14/13 1453  NA 130*  K 4.1  CL 92*  CO2 22   GLUCOSE 101*  BUN 7  CREATININE 0.79  CALCIUM 9.1   Liver Function Tests: No results found for this basename: AST, ALT, ALKPHOS, BILITOT, PROT, ALBUMIN,  in the last 168 hours No results found for this basename: LIPASE, AMYLASE,  in the last 168 hours No results found for this basename: AMMONIA,  in the last 168 hours CBC:  Recent Labs Lab 10/14/13 1453  WBC 9.8  NEUTROABS 7.7  HGB 18.5*  HCT 50.3  MCV 95.1  PLT 203   Cardiac Enzymes:  Recent Labs Lab 10/14/13 1436  TROPONINI <0.30    BNP (last 3 results)  Recent Labs  10/14/13 1510  PROBNP 73.7   CBG: No results found for this basename: GLUCAP,  in the last 168 hours  Radiological  Exams on Admission: Ct Angio Chest Pe W/cm &/or Wo Cm  10/14/2013   CLINICAL DATA:  Chest pain and dyspnea  EXAM: CT ANGIOGRAPHY CHEST WITH CONTRAST  TECHNIQUE: Multidetector CT imaging of the chest was performed using the standard protocol during bolus administration of intravenous contrast. Multiplanar CT image reconstructions and MIPs were obtained to evaluate the vascular anatomy.  CONTRAST:  138mL OMNIPAQUE IOHEXOL 350 MG/ML SOLN  COMPARISON:  DG CHEST 1V PORT dated 10/14/2013  FINDINGS: Contrast within the pulmonary arterial tree is normal in appearance. There are no filling defects to suggest an acute pulmonary embolism. The caliber of the thoracic aorta is normal. There is no false lumen. The cardiac chambers are top-normal in size. There is no pleural nor pericardial effusion. There is no mediastinal nor hilar lymphadenopathy. The thoracic esophagus exhibits normal contour.  At lung window settings there is increased interstitial density in both lower lobes which becomes confluent inferiorly which is consistent with pneumonia. The right middle lobe and both upper lobes are clear. Mild emphysematous changes are present bilaterally. Within the upper abdomen the observed portions of the liver and spleen appear normal.  The thoracic vertebral  bodies and sternum exhibit no acute abnormalities. There are degenerative disc changes in the lower thoracic spine. No acute rib abnormality is demonstrated.  Review of the MIP images confirms the above findings.  IMPRESSION: 1. There is no evidence of an acute pulmonary embolism or acute thoracic aortic pathology. 2. Bilateral lower lobe parenchymal consolidation is compatible with pneumonia. There is no pleural effusion. There is underlying COPD. 3. There is borderline enlargement of the cardiac chambers without evidence of pulmonary edema. There is no mediastinal or hilar lymphadenopathy.   Electronically Signed   By: David  Martinique   On: 10/14/2013 17:31   Dg Chest Port 1 View  10/14/2013   CLINICAL DATA:  Chest pain.  EXAM: PORTABLE CHEST - 1 VIEW  COMPARISON:  Chest x-ray 04/21/2012.  FINDINGS: Lung volumes are low. There is an ill-defined opacity in the lower left hemithorax which partially obscures the lateral left hemidiaphragm, concerning for developing airspace consolidation. Right lung is clear. No evidence of pulmonary edema. No definite pleural effusions. Heart size is normal. The patient is rotated to the right on today's exam, resulting in distortion of the mediastinal contours and reduced diagnostic sensitivity and specificity for mediastinal pathology.  IMPRESSION: 1. New airspace consolidation in the left lower lobe concerning for potential area of developing pneumonia, or, in the appropriate clinical setting, hemorrhage from pulmonary infarction. Clinical correlation is recommended, and if there is no leukocytosis or signs or symptoms of infection, consideration for further evaluation with PE protocol CT scan may be warranted.   Electronically Signed   By: Vinnie Langton M.D.   On: 10/14/2013 15:09    EKG: Independently reviewed. Normal sinus rhythm, heart rate 70, RBBB with left anterior fascicular block  Assessment/Plan Active Problems:  CAP (community acquired pneumonia) -As  discussed above, will obtain pneumonia workup as per protocol-urine strep pneumo and Legionella antigens, sputum and blood cultures, and follow. -Continue empiric antibiotics with Rocephin and Zithromax -Placed on mucolytics, follow and further manage accordingly.   Hyponatremia -Likely secondary to volume depletion, although SIADH secondary to above lung process also possibilty -Hydrate follow recheck and further manage accordingly.   SMOKER/tobacco abuse with underlying COPD -Continue Symbicort -When necessary Xopenex -Will add prednisone and follow.    Atrial fibrillation -Continue Tambocor -He is in sinus rhythm and on no anticoagulation, follow.  GERD   -Continue PPI, dose increased to twice a day   Code Status:  Full code Family Communication: None at bedside Disposition Plan: Admit to telemetry  Time spent: Greater than 30 minutes  [facility] Pager [phone]

## 2013-10-15 LAB — BASIC METABOLIC PANEL
BUN: 9 mg/dL (ref 6–23)
CALCIUM: 8.8 mg/dL (ref 8.4–10.5)
CO2: 23 mEq/L (ref 19–32)
CREATININE: 0.83 mg/dL (ref 0.50–1.35)
Chloride: 95 mEq/L — ABNORMAL LOW (ref 96–112)
GFR calc non Af Amer: >90 mL/min (ref >90)
Glucose, Bld: 98 mg/dL (ref 70–99)
Potassium: 4.5 mEq/L (ref 3.7–5.3)
Sodium: 132 mEq/L — ABNORMAL LOW (ref 137–147)

## 2013-10-15 LAB — STREP PNEUMONIAE URINARY ANTIGEN: STREP PNEUMO URINARY ANTIGEN: NEGATIVE

## 2013-10-15 LAB — HIV ANTIBODY (ROUTINE TESTING W REFLEX): HIV: NONREACTIVE

## 2013-10-15 LAB — TROPONIN I

## 2013-10-15 MED ORDER — THIAMINE HCL 100 MG/ML IJ SOLN
100.0000 mg | Freq: Every day | INTRAMUSCULAR | Status: DC
  Filled 2013-10-15 (×2): qty 1

## 2013-10-15 MED ORDER — LORAZEPAM 1 MG PO TABS: 1.0000 mg | ORAL_TABLET | Freq: Four times a day (QID) | ORAL | Status: DC | PRN

## 2013-10-15 MED ORDER — VITAMIN B-1 100 MG PO TABS
100.0000 mg | ORAL_TABLET | Freq: Every day | ORAL | Status: DC
Start: 2013-10-15 — End: 2013-10-16
  Administered 2013-10-15 – 2013-10-16 (×2): 100 mg via ORAL
  Filled 2013-10-15 (×2): qty 1

## 2013-10-15 MED ORDER — GUAIFENESIN ER 600 MG PO TB12
1200.0000 mg | ORAL_TABLET | Freq: Two times a day (BID) | ORAL | Status: DC
  Administered 2013-10-15 – 2013-10-16 (×3): 1200 mg via ORAL
  Filled 2013-10-15 (×4): qty 2

## 2013-10-15 MED ORDER — ADULT MULTIVITAMIN W/MINERALS CH
1.0000 | ORAL_TABLET | Freq: Every day | ORAL | Status: DC
  Administered 2013-10-15 – 2013-10-16 (×2): 1 via ORAL
  Filled 2013-10-15 (×2): qty 1

## 2013-10-15 MED ORDER — LORAZEPAM 2 MG/ML IJ SOLN: 1.0000 mg | Freq: Four times a day (QID) | INTRAMUSCULAR | Status: DC | PRN

## 2013-10-15 MED ORDER — PNEUMOCOCCAL VAC POLYVALENT 25 MCG/0.5ML IJ INJ
0.5000 mL | INJECTION | INTRAMUSCULAR | Status: AC | PRN
  Administered 2013-10-16: 0.5 mL via INTRAMUSCULAR

## 2013-10-15 MED ORDER — FOLIC ACID 1 MG PO TABS
1.0000 mg | ORAL_TABLET | Freq: Every day | ORAL | Status: DC
  Administered 2013-10-15 – 2013-10-16 (×2): 1 mg via ORAL
  Filled 2013-10-15 (×2): qty 1

## 2013-10-15 NOTE — Progress Notes (Signed)
Note stated patient drinks 6-8 beers per day. RN spoke with patient about ETOH use and confirmed amount. Patient states last ETOH use was Friday night. Suction setup and oxygen tubing placed in patient's room. On-call provider notified of ETOH use to inquire about whether CIWA protocol would be appropriate. Patient denies any withdrawal symptoms at this time (see documented CIWA in chart). Will continue to monitor.

## 2013-10-15 NOTE — Progress Notes (Signed)
[facility] PROGRESS NOTE   Sean Riley LPF:[id number] DOB: 1951-06-29 DOA: 10/14/2013 PCP: Unice Cobble, MD  HPI/Subjective: Feels okay, still have some shortness of breath and cough/sputum production  Assessment/Plan: Active Problems:   SMOKER   Atrial fibrillation   GERD   Community acquired pneumonia   CAP (community acquired pneumonia)   Hyponatremia   CAP (community acquired pneumonia)  -Continue empiric antibiotics with Rocephin and Zithromax  -Negative urinary Streptococcus antigen -Placed on mucolytics, and the doses and as needed oxygen follow and further manage accordingly.   Hyponatremia  -Likely secondary to volume depletion, although SIADH secondary to above lung process also possibilty  -Improved since yesterday with IV fluid hydration, this is could be also secondary to alcohol abuse.  Alcohol abuse -Patient reported drinking 6-8 beers every night, never had DTs before. -Watch for withdrawal symptoms.  SMOKER/tobacco abuse with underlying COPD  -Continue Symbicort  -When necessary Xopenex  -Will add prednisone and follow.   Atrial fibrillation  -Continue Tambocor  -He is in sinus rhythm and on no anticoagulation, follow.   GERD  -Continue PPI, dose increased to twice a day   Code Status: Full code Family Communication: Plan discussed with the patient. Disposition Plan: Remains inpatient, potential discharge in a.m.   Consultants:  None  Procedures:  None  Antibiotics:  Ceftriaxone and azithromycin   Objective: Filed Vitals:   10/15/13 1213  BP: 158/92  Pulse: 75  Temp: 97.6 F (36.4 C)  Resp: 18   No intake or output data in the 24 hours ending 10/15/13 1217 Filed Weights   10/14/13 1922  Weight: 85.911 kg (189 lb 6.4 oz)    Exam: General: Alert and awake, oriented x3, not in any acute distress. HEENT: anicteric sclera, pupils reactive to light and accommodation, EOMI CVS: S1-S2 clear, no murmur rubs or  gallops Chest: clear to auscultation bilaterally, no wheezing, rales or rhonchi Abdomen: soft nontender, nondistended, normal bowel sounds, no organomegaly Extremities: no cyanosis, clubbing or edema noted bilaterally Neuro: Cranial nerves II-XII intact, no focal neurological deficits  Data Reviewed: Basic Metabolic Panel:  Recent Labs Lab 10/14/13 1453 10/15/13 0115  NA 130* 132*  K 4.1 4.5  CL 92* 95*  CO2 22 23  GLUCOSE 101* 98  BUN 7 9  CREATININE 0.79 0.83  CALCIUM 9.1 8.8   Liver Function Tests: No results found for this basename: AST, ALT, ALKPHOS, BILITOT, PROT, ALBUMIN,  in the last 168 hours No results found for this basename: LIPASE, AMYLASE,  in the last 168 hours No results found for this basename: AMMONIA,  in the last 168 hours CBC:  Recent Labs Lab 10/14/13 1453  WBC 9.8  NEUTROABS 7.7  HGB 18.5*  HCT 50.3  MCV 95.1  PLT 203   Cardiac Enzymes:  Recent Labs Lab 10/14/13 1436 10/14/13 2026 10/15/13 0115  TROPONINI <0.30 <0.30 <0.30   BNP (last 3 results)  Recent Labs  10/14/13 1510  PROBNP 73.7   CBG: No results found for this basename: GLUCAP,  in the last 168 hours  Micro No results found for this or any previous visit (from the past 240 hour(s)).   Studies: Ct Angio Chest Pe W/cm &/or Wo Cm  10/14/2013   CLINICAL DATA:  Chest pain and dyspnea  EXAM: CT ANGIOGRAPHY CHEST WITH CONTRAST  TECHNIQUE: Multidetector CT imaging of the chest was performed using the standard protocol during bolus administration of intravenous contrast. Multiplanar CT image reconstructions and MIPs were obtained to evaluate the  vascular anatomy.  CONTRAST:  146mL OMNIPAQUE IOHEXOL 350 MG/ML SOLN  COMPARISON:  DG CHEST 1V PORT dated 10/14/2013  FINDINGS: Contrast within the pulmonary arterial tree is normal in appearance. There are no filling defects to suggest an acute pulmonary embolism. The caliber of the thoracic aorta is normal. There is no false lumen. The cardiac  chambers are top-normal in size. There is no pleural nor pericardial effusion. There is no mediastinal nor hilar lymphadenopathy. The thoracic esophagus exhibits normal contour.  At lung window settings there is increased interstitial density in both lower lobes which becomes confluent inferiorly which is consistent with pneumonia. The right middle lobe and both upper lobes are clear. Mild emphysematous changes are present bilaterally. Within the upper abdomen the observed portions of the liver and spleen appear normal.  The thoracic vertebral bodies and sternum exhibit no acute abnormalities. There are degenerative disc changes in the lower thoracic spine. No acute rib abnormality is demonstrated.  Review of the MIP images confirms the above findings.  IMPRESSION: 1. There is no evidence of an acute pulmonary embolism or acute thoracic aortic pathology. 2. Bilateral lower lobe parenchymal consolidation is compatible with pneumonia. There is no pleural effusion. There is underlying COPD. 3. There is borderline enlargement of the cardiac chambers without evidence of pulmonary edema. There is no mediastinal or hilar lymphadenopathy.   Electronically Signed   By: David  Martinique   On: 10/14/2013 17:31   Dg Chest Port 1 View  10/14/2013   CLINICAL DATA:  Chest pain.  EXAM: PORTABLE CHEST - 1 VIEW  COMPARISON:  Chest x-ray 04/21/2012.  FINDINGS: Lung volumes are low. There is an ill-defined opacity in the lower left hemithorax which partially obscures the lateral left hemidiaphragm, concerning for developing airspace consolidation. Right lung is clear. No evidence of pulmonary edema. No definite pleural effusions. Heart size is normal. The patient is rotated to the right on today's exam, resulting in distortion of the mediastinal contours and reduced diagnostic sensitivity and specificity for mediastinal pathology.  IMPRESSION: 1. New airspace consolidation in the left lower lobe concerning for potential area of  developing pneumonia, or, in the appropriate clinical setting, hemorrhage from pulmonary infarction. Clinical correlation is recommended, and if there is no leukocytosis or signs or symptoms of infection, consideration for further evaluation with PE protocol CT scan may be warranted.   Electronically Signed   By: Vinnie Langton M.D.   On: 10/14/2013 15:09    Scheduled Meds: . azithromycin  500 mg Intravenous Q24H  . budesonide-formoterol  2 puff Inhalation BID  . cefTRIAXone (ROCEPHIN)  IV  1 g Intravenous Q24H  . clobetasol cream  1 application Topical Daily  . dextromethorphan-guaiFENesin  2 tablet Oral BID  . enoxaparin (LOVENOX) injection  40 mg Subcutaneous Q24H  . flecainide  100 mg Oral BID  . fluticasone  2 spray Each Nare Daily  . folic acid  1 mg Oral Daily  . multivitamin with minerals  1 tablet Oral Daily  . pantoprazole  40 mg Oral BID AC  . pneumococcal 23 valent vaccine  0.5 mL Intramuscular Tomorrow-1000  . predniSONE  50 mg Oral Q breakfast  . thiamine  100 mg Oral Daily   Or  . thiamine  100 mg Intravenous Daily   Continuous Infusions: . sodium chloride 75 mL/hr at 10/15/13 1011       Time spent: 35 minutes    [facility] A  [facility] Pager [phone] If 7PM-7AM, please contact night-coverage at [email or web address],  password [address] [facility] 10/15/2013, 12:17 PM  LOS: 1 day

## 2013-10-16 LAB — LEGIONELLA ANTIGEN, URINE: Legionella Antigen, Urine: NEGATIVE

## 2013-10-16 LAB — BASIC METABOLIC PANEL
BUN: 9 mg/dL (ref 6–23)
CO2: 23 meq/L (ref 19–32)
CREATININE: 0.76 mg/dL (ref 0.50–1.35)
Calcium: 8.5 mg/dL (ref 8.4–10.5)
Chloride: 98 mEq/L (ref 96–112)
GFR calc Af Amer: >90 mL/min (ref >90)
GFR calc non Af Amer: >90 mL/min (ref >90)
Glucose, Bld: 97 mg/dL (ref 70–99)
Potassium: 3.8 mEq/L (ref 3.7–5.3)
Sodium: 133 mEq/L — ABNORMAL LOW (ref 137–147)

## 2013-10-16 MED ORDER — LISINOPRIL 5 MG PO TABS: 5.0000 mg | ORAL_TABLET | Freq: Every day | ORAL | Status: DC

## 2013-10-16 MED ORDER — ASPIRIN EC 81 MG PO TBEC: 81.0000 mg | DELAYED_RELEASE_TABLET | Freq: Every day | ORAL | Status: DC

## 2013-10-16 MED ORDER — LEVOFLOXACIN 750 MG PO TABS: 750.0000 mg | ORAL_TABLET | Freq: Every day | ORAL | Status: DC

## 2013-10-16 MED ORDER — GUAIFENESIN ER 600 MG PO TB12: 1200.0000 mg | ORAL_TABLET | Freq: Two times a day (BID) | ORAL | Status: DC

## 2013-10-16 MED ORDER — HYDRALAZINE HCL 20 MG/ML IJ SOLN
10.0000 mg | Freq: Four times a day (QID) | INTRAMUSCULAR | Status: DC | PRN
  Administered 2013-10-16: 10 mg via INTRAVENOUS
  Filled 2013-10-16: qty 1

## 2013-10-16 NOTE — Discharge Summary (Signed)
Physician Discharge Summary  Sean Riley:[id number] DOB: 18-Mar-1951 DOA: 10/14/2013  PCP: Unice Cobble, MD  Admit date: 10/14/2013 Discharge date: 10/16/2013  Time spent: 40 minutes  Recommendations for Outpatient Follow-up:  1. Followup with primary care physician within one week  Discharge Diagnoses:  Active Problems:   SMOKER   Atrial fibrillation   GERD   Community acquired pneumonia   CAP (community acquired pneumonia)   Hyponatremia   Discharge Condition: Stable  Diet recommendation: Regular  Filed Weights   10/14/13 1922  Weight: 85.911 kg (189 lb 6.4 oz)    History of present illness:  Sean Riley is a 63 y.o. male smoker with a history of itch or fibrillation, GERD and hyperlipidemia who presents with above complaints. He states that he has had nasal congestion/URI symptoms for the past 2-3 weeks about that last night developed chest tightness worse with inspiration as well as shortness of breath. He denies leg swelling and no orthopnea or PND. He admits to a nonproductive cough. He was seen in the ED-chest x-ray done showed new airspace consolidation in left lower lobe concerning for pneumonia, CT angiogram was negative for PE, revealed bilateral lower lobe parenchymal consolidation compatible with pneumonia, with underlying COPD noted. He was started on empiric antibiotics and is admitted for further evaluation and management. His BNP was within normal limits at at 73.7  Hospital Course:   CAP (community acquired pneumonia)  -Continue empiric antibiotics with Rocephin and Zithromax  -Negative urinary Streptococcus antigen  -Placed on mucolytics, and antitussives. -On discharge levofloxacin for 5 more days along with Mucinex.  Hyponatremia  -Likely secondary to volume depletion, although SIADH secondary to above lung process also possibilty  -Improved since yesterday with IV fluid hydration, this is could be also secondary to alcohol abuse. -Sodium is  133 today, asymptomatic.  Elevated blood pressure -Patient has history of atrial fibrillation but not high blood pressure. -SBP maintained above 160 while he is in the hospital, started on 5 mg of lisinopril. -Patient to followup with primary care physician for further adjustment of medications.  Alcohol abuse  -Patient reported drinking 6-8 beers every night, never had DTs before.  -Watch for withdrawal symptoms.   SMOKER/tobacco abuse with underlying COPD  -Continue Symbicort  -When necessary Xopenex  -Will add prednisone and follow.   Atrial fibrillation  -Continue Tambocor  -He is in sinus rhythm and on no anticoagulation, I started him on low dose of aspirin.  GERD  -Continue PPI, dose increased to twice a day   Procedures:  None  Consultations:  None  Discharge Exam: Filed Vitals:   10/16/13 0605  BP: 165/95  Pulse: 67  Temp: 98.2 F (36.8 C)  Resp: 18   General: Alert and awake, oriented x3, not in any acute distress. HEENT: anicteric sclera, pupils reactive to light and accommodation, EOMI CVS: S1-S2 clear, no murmur rubs or gallops Chest: clear to auscultation bilaterally, no wheezing, rales or rhonchi Abdomen: soft nontender, nondistended, normal bowel sounds, no organomegaly Extremities: no cyanosis, clubbing or edema noted bilaterally Neuro: Cranial nerves II-XII intact, no focal neurological deficits  Discharge Instructions      Discharge Orders   Future Appointments Provider Department Dept Phone   12/14/2013 2:00 PM Deboraha Sprang, MD [place] Office [phone]   Future Orders Complete By Expires   Diet - low sodium heart healthy  As directed        Medication List    STOP taking these medications  SINUS MEDICINE PO      TAKE these medications       aspirin EC 81 MG tablet  Take 1 tablet (81 mg total) by mouth daily.     clobetasol cream 0.05 %  Commonly known as:  TEMOVATE  Apply 1 application topically  daily. As needed     flecainide 100 MG tablet  Commonly known as:  TAMBOCOR  Take 100 mg by mouth 2 (two) times daily.     guaiFENesin 600 MG 12 hr tablet  Commonly known as:  MUCINEX  Take 2 tablets (1,200 mg total) by mouth 2 (two) times daily.     levofloxacin 750 MG tablet  Commonly known as:  LEVAQUIN  Take 1 tablet (750 mg total) by mouth daily.     lisinopril 5 MG tablet  Commonly known as:  PRINIVIL,ZESTRIL  Take 1 tablet (5 mg total) by mouth daily.     mometasone 50 MCG/ACT nasal spray  Commonly known as:  NASONEX  Place 2 sprays into the nose daily as needed (for allergies).     NEXIUM 40 MG capsule  Generic drug:  esomeprazole  Take 1 capsule (40 mg total) by mouth daily before breakfast.       Allergies  Allergen Reactions  . Simvastatin Other (See Comments)    REACTION: mental status changes  . Celecoxib Other (See Comments)    REACTION: GI UPSET AND INFLAMMATION  . Codeine     REACTION: GI UPSET AND INFLAMMATION  . Nsaids Other (See Comments)    REACTION: GI UPSET AND INFLAMMATION   Follow-up Information   Follow up with Unice Cobble, MD In 1 week.   Specialty:  Internal Medicine   Contact information:   [address] [address] [phone]        The results of significant diagnostics from this hospitalization (including imaging, microbiology, ancillary and laboratory) are listed below for reference.    Significant Diagnostic Studies: Ct Angio Chest Pe W/cm &/or Wo Cm  10/14/2013   CLINICAL DATA:  Chest pain and dyspnea  EXAM: CT ANGIOGRAPHY CHEST WITH CONTRAST  TECHNIQUE: Multidetector CT imaging of the chest was performed using the standard protocol during bolus administration of intravenous contrast. Multiplanar CT image reconstructions and MIPs were obtained to evaluate the vascular anatomy.  CONTRAST:  166mL OMNIPAQUE IOHEXOL 350 MG/ML SOLN  COMPARISON:  DG CHEST 1V PORT dated 10/14/2013  FINDINGS: Contrast within the pulmonary  arterial tree is normal in appearance. There are no filling defects to suggest an acute pulmonary embolism. The caliber of the thoracic aorta is normal. There is no false lumen. The cardiac chambers are top-normal in size. There is no pleural nor pericardial effusion. There is no mediastinal nor hilar lymphadenopathy. The thoracic esophagus exhibits normal contour.  At lung window settings there is increased interstitial density in both lower lobes which becomes confluent inferiorly which is consistent with pneumonia. The right middle lobe and both upper lobes are clear. Mild emphysematous changes are present bilaterally. Within the upper abdomen the observed portions of the liver and spleen appear normal.  The thoracic vertebral bodies and sternum exhibit no acute abnormalities. There are degenerative disc changes in the lower thoracic spine. No acute rib abnormality is demonstrated.  Review of the MIP images confirms the above findings.  IMPRESSION: 1. There is no evidence of an acute pulmonary embolism or acute thoracic aortic pathology. 2. Bilateral lower lobe parenchymal consolidation is compatible with pneumonia. There is no pleural  effusion. There is underlying COPD. 3. There is borderline enlargement of the cardiac chambers without evidence of pulmonary edema. There is no mediastinal or hilar lymphadenopathy.   Electronically Signed   By: David  Martinique   On: 10/14/2013 17:31   Dg Chest Port 1 View  10/14/2013   CLINICAL DATA:  Chest pain.  EXAM: PORTABLE CHEST - 1 VIEW  COMPARISON:  Chest x-ray 04/21/2012.  FINDINGS: Lung volumes are low. There is an ill-defined opacity in the lower left hemithorax which partially obscures the lateral left hemidiaphragm, concerning for developing airspace consolidation. Right lung is clear. No evidence of pulmonary edema. No definite pleural effusions. Heart size is normal. The patient is rotated to the right on today's exam, resulting in distortion of the mediastinal  contours and reduced diagnostic sensitivity and specificity for mediastinal pathology.  IMPRESSION: 1. New airspace consolidation in the left lower lobe concerning for potential area of developing pneumonia, or, in the appropriate clinical setting, hemorrhage from pulmonary infarction. Clinical correlation is recommended, and if there is no leukocytosis or signs or symptoms of infection, consideration for further evaluation with PE protocol CT scan may be warranted.   Electronically Signed   By: Vinnie Langton M.D.   On: 10/14/2013 15:09    Microbiology: Recent Results (from the past 240 hour(s))  CULTURE, BLOOD (ROUTINE X 2)     Status: None   Collection Time    10/14/13  8:26 PM      Result Value Ref Range Status   Specimen Description BLOOD RIGHT ARM   Final   Special Requests BOTTLES DRAWN AEROBIC AND ANAEROBIC 5CC EACH   Final   Culture  Setup Time     Final   Value: 10/15/2013 13:08     Performed at [facility]   Culture     Final   Value:        BLOOD CULTURE RECEIVED NO GROWTH TO DATE CULTURE WILL BE HELD FOR 5 DAYS BEFORE ISSUING A FINAL NEGATIVE REPORT     Performed at [facility]   Report Status PENDING   Incomplete  CULTURE, BLOOD (ROUTINE X 2)     Status: None   Collection Time    10/14/13  8:42 PM      Result Value Ref Range Status   Specimen Description BLOOD RIGHT ARM   Final   Special Requests BOTTLES DRAWN AEROBIC AND ANAEROBIC 5CC EACH   Final   Culture  Setup Time     Final   Value: 10/15/2013 13:08     Performed at [facility]   Culture     Final   Value:        BLOOD CULTURE RECEIVED NO GROWTH TO DATE CULTURE WILL BE HELD FOR 5 DAYS BEFORE ISSUING A FINAL NEGATIVE REPORT     Performed at [facility]   Report Status PENDING   Incomplete     Labs: Basic Metabolic Panel:  Recent Labs Lab 10/14/13 1453 10/15/13 0115 10/16/13 0552  NA 130* 132* 133*  K 4.1 4.5 3.8  CL 92* 95* 98  CO2 22 23 23   GLUCOSE 101* 98 97  BUN  7 9 9   CREATININE 0.79 0.83 0.76  CALCIUM 9.1 8.8 8.5   Liver Function Tests: No results found for this basename: AST, ALT, ALKPHOS, BILITOT, PROT, ALBUMIN,  in the last 168 hours No results found for this basename: LIPASE, AMYLASE,  in the last 168 hours No results found for this  basename: AMMONIA,  in the last 168 hours CBC:  Recent Labs Lab 10/14/13 1453  WBC 9.8  NEUTROABS 7.7  HGB 18.5*  HCT 50.3  MCV 95.1  PLT 203   Cardiac Enzymes:  Recent Labs Lab 10/14/13 1436 10/14/13 2026 10/15/13 0115  TROPONINI <0.30 <0.30 <0.30   BNP: BNP (last 3 results)  Recent Labs  10/14/13 1510  PROBNP 73.7   CBG: No results found for this basename: GLUCAP,  in the last 168 hours     Signed:  Maimouna Rondeau A  [facility] 10/16/2013, 10:04 AM

## 2013-10-16 NOTE — Progress Notes (Signed)
NURSING PROGRESS NOTE  Sean Riley [id number] Discharge Data: 10/16/2013 10:40 AM Attending Provider: Verlee Monte, MD DJT:[id number] Linna Darner, MD     [name] to be D/C'd Home per MD order.  Discussed with the patient the After Visit Summary and all questions fully answered. All IV's discontinued with no bleeding noted. All belongings returned to patient for patient to take home.   Last Vital Signs:  Blood pressure 165/95, pulse 67, temperature 98.2 F (36.8 C), temperature source Oral, resp. rate 18, height 6\' 1"  (1.854 m), weight 85.911 kg (189 lb 6.4 oz), SpO2 94.00%.  Discharge Medication List   Medication List    STOP taking these medications       SINUS MEDICINE PO      TAKE these medications       aspirin EC 81 MG tablet  Take 1 tablet (81 mg total) by mouth daily.     clobetasol cream 0.05 %  Commonly known as:  TEMOVATE  Apply 1 application topically daily. As needed     flecainide 100 MG tablet  Commonly known as:  TAMBOCOR  Take 100 mg by mouth 2 (two) times daily.     guaiFENesin 600 MG 12 hr tablet  Commonly known as:  MUCINEX  Take 2 tablets (1,200 mg total) by mouth 2 (two) times daily.     levofloxacin 750 MG tablet  Commonly known as:  LEVAQUIN  Take 1 tablet (750 mg total) by mouth daily.     lisinopril 5 MG tablet  Commonly known as:  PRINIVIL,ZESTRIL  Take 1 tablet (5 mg total) by mouth daily.     mometasone 50 MCG/ACT nasal spray  Commonly known as:  NASONEX  Place 2 sprays into the nose daily as needed (for allergies).     NEXIUM 40 MG capsule  Generic drug:  esomeprazole  Take 1 capsule (40 mg total) by mouth daily before breakfast.

## 2013-10-16 NOTE — Care Management Note (Signed)
    Page 1 of 1   10/16/2013     10:36:32 AM   CARE MANAGEMENT NOTE 10/16/2013  Patient:  Sean Riley, Sean Riley   Account Number:  [id number]  Date Initiated:  10/16/2013  Documentation initiated by:  Tomi Bamberger  Subjective/Objective Assessment:   dx pna  admit- from home.     Action/Plan:   Anticipated DC Date:  10/16/2013   Anticipated DC Plan:  [place]  CM consult      Choice offered to / List presented to:             Status of service:  Completed, signed off Medicare Important Message given?   (If response is "NO", the following Medicare IM given date fields will be blank) Date Medicare IM given:   Date Additional Medicare IM given:    Discharge Disposition:  HOME/SELF CARE  Per UR Regulation:  Reviewed for med. necessity/level of care/duration of stay  If discussed at [place] of Stay Meetings, dates discussed:    Comments:

## 2013-10-17 ENCOUNTER — Telehealth: Payer: Self-pay

## 2013-10-17 NOTE — Telephone Encounter (Signed)
Wife calls for hospital follow up for patient's PNA. Was discharged on lisinopril 5mg  which is a new medication for him. Follow up scheduled for 10/24/2013.

## 2013-10-20 ENCOUNTER — Emergency Department (HOSPITAL_COMMUNITY): Payer: BC Managed Care – PPO

## 2013-10-20 ENCOUNTER — Encounter (HOSPITAL_COMMUNITY): Payer: Self-pay | Admitting: Emergency Medicine

## 2013-10-20 ENCOUNTER — Emergency Department (HOSPITAL_COMMUNITY)
Admission: EM | Admit: 2013-10-20 | Discharge: 2013-10-20 | Disposition: A | Discharge status: Home / Self Care | Payer: BC Managed Care – PPO | Attending: Emergency Medicine | Admitting: Emergency Medicine

## 2013-10-20 DIAGNOSIS — IMO0002 Reserved for concepts with insufficient information to code with codable children: Secondary | ICD-10-CM | POA: Insufficient documentation

## 2013-10-20 DIAGNOSIS — Z87448 Personal history of other diseases of urinary system: Secondary | ICD-10-CM | POA: Insufficient documentation

## 2013-10-20 DIAGNOSIS — Z792 Long term (current) use of antibiotics: Secondary | ICD-10-CM | POA: Insufficient documentation

## 2013-10-20 DIAGNOSIS — Z7982 Long term (current) use of aspirin: Secondary | ICD-10-CM | POA: Insufficient documentation

## 2013-10-20 DIAGNOSIS — Z8639 Personal history of other endocrine, nutritional and metabolic disease: Secondary | ICD-10-CM | POA: Insufficient documentation

## 2013-10-20 DIAGNOSIS — F17200 Nicotine dependence, unspecified, uncomplicated: Secondary | ICD-10-CM | POA: Insufficient documentation

## 2013-10-20 DIAGNOSIS — Z862 Personal history of diseases of the blood and blood-forming organs and certain disorders involving the immune mechanism: Secondary | ICD-10-CM | POA: Insufficient documentation

## 2013-10-20 DIAGNOSIS — R079 Chest pain, unspecified: Secondary | ICD-10-CM | POA: Insufficient documentation

## 2013-10-20 DIAGNOSIS — Z79899 Other long term (current) drug therapy: Secondary | ICD-10-CM | POA: Insufficient documentation

## 2013-10-20 DIAGNOSIS — J45901 Unspecified asthma with (acute) exacerbation: Secondary | ICD-10-CM | POA: Insufficient documentation

## 2013-10-20 DIAGNOSIS — K219 Gastro-esophageal reflux disease without esophagitis: Secondary | ICD-10-CM | POA: Insufficient documentation

## 2013-10-20 DIAGNOSIS — I4891 Unspecified atrial fibrillation: Secondary | ICD-10-CM | POA: Insufficient documentation

## 2013-10-20 DIAGNOSIS — J441 Chronic obstructive pulmonary disease with (acute) exacerbation: Secondary | ICD-10-CM

## 2013-10-20 LAB — CBC
HCT: 49.5 % (ref 39.0–52.0)
Hemoglobin: 17.8 g/dL — ABNORMAL HIGH (ref 13.0–17.0)
MCH: 34.2 pg — ABNORMAL HIGH (ref 26.0–34.0)
MCHC: 36 g/dL (ref 30.0–36.0)
MCV: 95 fL (ref 78.0–100.0)
PLATELETS: 212 10*3/uL (ref 150–400)
RBC: 5.21 MIL/uL (ref 4.22–5.81)
RDW: 12.8 % (ref 11.5–15.5)
WBC: 7.1 10*3/uL (ref 4.0–10.5)

## 2013-10-20 LAB — BASIC METABOLIC PANEL
BUN: 7 mg/dL (ref 6–23)
CALCIUM: 9.2 mg/dL (ref 8.4–10.5)
CO2: 24 mEq/L (ref 19–32)
CREATININE: 0.81 mg/dL (ref 0.50–1.35)
Chloride: 96 mEq/L (ref 96–112)
Glucose, Bld: 96 mg/dL (ref 70–99)
Potassium: 4.1 mEq/L (ref 3.7–5.3)
Sodium: 132 mEq/L — ABNORMAL LOW (ref 137–147)

## 2013-10-20 LAB — I-STAT TROPONIN, ED: Troponin i, poc: 0 ng/mL (ref 0.00–0.08)

## 2013-10-20 LAB — I-STAT CG4 LACTIC ACID, ED: Lactic Acid, Venous: 1.25 mmol/L (ref 0.5–2.2)

## 2013-10-20 LAB — PRO B NATRIURETIC PEPTIDE: PRO B NATRI PEPTIDE: 92 pg/mL (ref 0–125)

## 2013-10-20 MED ORDER — IPRATROPIUM-ALBUTEROL 0.5-2.5 (3) MG/3ML IN SOLN
3.0000 mL | Freq: Once | RESPIRATORY_TRACT | Status: AC
  Administered 2013-10-20: 3 mL via RESPIRATORY_TRACT
  Filled 2013-10-20: qty 3

## 2013-10-20 MED ORDER — ALBUTEROL SULFATE HFA 108 (90 BASE) MCG/ACT IN AERS
2.0000 | INHALATION_SPRAY | Freq: Once | RESPIRATORY_TRACT | Status: AC
  Administered 2013-10-20: 2 via RESPIRATORY_TRACT
  Filled 2013-10-20: qty 6.7

## 2013-10-20 MED ORDER — PREDNISONE 20 MG PO TABS
60.0000 mg | ORAL_TABLET | Freq: Once | ORAL | Status: AC
  Administered 2013-10-20: 60 mg via ORAL
  Filled 2013-10-20: qty 3

## 2013-10-20 MED ORDER — PREDNISONE 20 MG PO TABS: 60.0000 mg | ORAL_TABLET | Freq: Every day | ORAL | Status: DC

## 2013-10-20 MED ORDER — TRAMADOL HCL 50 MG PO TABS: 50.0000 mg | ORAL_TABLET | Freq: Four times a day (QID) | ORAL | Status: DC | PRN

## 2013-10-20 MED ORDER — MORPHINE SULFATE 4 MG/ML IJ SOLN
4.0000 mg | Freq: Once | INTRAMUSCULAR | Status: AC
  Administered 2013-10-20: 4 mg via INTRAVENOUS
  Filled 2013-10-20: qty 1

## 2013-10-20 MED ORDER — ALBUTEROL SULFATE HFA 108 (90 BASE) MCG/ACT IN AERS: 2.0000 | INHALATION_SPRAY | RESPIRATORY_TRACT | Status: DC | PRN

## 2013-10-20 NOTE — Discharge Instructions (Signed)

## 2013-10-20 NOTE — ED Provider Notes (Signed)
CSN: [id number]     Arrival date & time 10/20/13  1446 History   First MD Initiated Contact with Patient 10/20/13 1810     Chief Complaint  Patient presents with  . Shortness of Breath     (Consider location/radiation/quality/duration/timing/severity/associated sxs/prior Treatment) HPI Comments: 63 yo male with dyspnea, chest pain/tightness and cough since yesterday. Denies fevers. Discharged a few days ago after being admitted for similar symptoms with PNA. Had no PE on CTA last week, but that's where the PNA was seen. No bloody sputum. Chest pain, dyspnea feel similar to his admission last week. Morphine helped his pain at that time. He finished his levaquin a few days ago. Symptoms started back yesterday. Has not used any inhalers. Is a current smoker.    Past Medical History  Diagnosis Date  . Atrial fibrillation   . HYPERLIPIDEMIA   . 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 Microscopic hematuria     negative cystoscopy  . NONSPECIFIC ABNORMAL ELECTROCARDIOGRAM   . GERD (gastroesophageal reflux disease)   . Varicose veins    Past Surgical History  Procedure Laterality Date  . Tracheotomy      age 35 for croup  . Knee arthroscopy  2012    Dr Theda Sers  . Shoulder arthroscopy  2011    Dr Theda Sers  . Coronary angioplasty      04/24/13 : he denies angioplasty  . Appendectomy    . Lumbar laminectomy  1987    Dr Durward Fortes  . Rotator cuff repair    . Spinal fusion  1986    Dr Rolin Barry  . Myelogram  2007  . Cardiac catheterization       X3;Dr Caryl Comes, last in July, 2013  . Colonoscopy  2007     negative; Dr Sharlett Iles  . Cystoscopy  1978    Dr Hartley Barefoot   Family History  Problem Relation Age of Onset  . Hypertension Mother   . Hypertension Father   . Benign prostatic hyperplasia Father     S/P TURP  . Heart attack Maternal Grandmother     MI in 1s  . Breast cancer Maternal Grandmother   . Arrhythmia Brother      X 2  . Heart attack Maternal Aunt     MI in  35s  . Stroke Neg Hx   . Diabetes Neg Hx   . Colon cancer Neg Hx   . Colon polyps Neg Hx   . Stomach cancer Neg Hx   . Rectal cancer Neg Hx   . Esophageal cancer Neg Hx    History  Substance Use Topics  . Smoking status: Current Every Day Smoker -- 1.00 packs/day for 30 years    Types: Cigarettes  . Smokeless tobacco: Not on file  . Alcohol Use: 0.0 oz/week     Comment: 6-8 cans of beer daily    Review of Systems  Constitutional: Negative for fever.  Respiratory: Positive for cough, chest tightness and shortness of breath.   Cardiovascular: Positive for chest pain. Negative for leg swelling.  Gastrointestinal: Negative for vomiting and anal bleeding.  All other systems reviewed and are negative.      Allergies  Simvastatin; Celecoxib; Codeine; and Nsaids  Home Medications   Current Outpatient Rx  Name  Route  Sig  Dispense  Refill  . aspirin EC 81 MG tablet   Oral   Take 1 tablet (81 mg total) by mouth daily.         Marland Kitchen  clobetasol cream (TEMOVATE) 0.05 %   Topical   Apply 1 application topically daily. As needed         . esomeprazole (NEXIUM) 40 MG capsule   Oral   Take 1 capsule (40 mg total) by mouth daily before breakfast.         . flecainide (TAMBOCOR) 100 MG tablet   Oral   Take 100 mg by mouth 2 (two) times daily.          Marland Kitchen guaiFENesin (MUCINEX) 600 MG 12 hr tablet   Oral   Take 2 tablets (1,200 mg total) by mouth 2 (two) times daily.   30 tablet   0   . levofloxacin (LEVAQUIN) 750 MG tablet   Oral   Take 1 tablet (750 mg total) by mouth daily.   5 tablet   0   . lisinopril (PRINIVIL,ZESTRIL) 5 MG tablet   Oral   Take 1 tablet (5 mg total) by mouth daily.   30 tablet   0   . mometasone (NASONEX) 50 MCG/ACT nasal spray   Nasal   Place 2 sprays into the nose daily as needed (for allergies).          BP 171/102  Pulse 72  Temp(Src) 98 F (36.7 C) (Oral)  Resp 18  Ht 6\' 1"  (1.854 m)  Wt 191 lb (86.637 kg)  BMI 25.20 kg/m2   SpO2 100% Physical Exam  Nursing note and vitals reviewed. Constitutional: He is oriented to person, place, and time. He appears well-developed and well-nourished. No distress.  HENT:  Head: Normocephalic and atraumatic.  Right Ear: External ear normal.  Left Ear: External ear normal.  Nose: Nose normal.  Eyes: Right eye exhibits no discharge. Left eye exhibits no discharge.  Neck: Neck supple.  Cardiovascular: Normal rate, regular rhythm, normal heart sounds and intact distal pulses.   Pulmonary/Chest: Effort normal. He has wheezes in the right lower field and the left lower field. He has rales in the right lower field and the left lower field. He exhibits tenderness.  Abdominal: Soft. There is no tenderness.  Musculoskeletal: He exhibits no edema.  Neurological: He is alert and oriented to person, place, and time.  Skin: Skin is warm and dry.    ED Course  Procedures (including critical care time) Labs Review Labs Reviewed  CBC - Abnormal; Notable for the following:    Hemoglobin 17.8 (*)    MCH 34.2 (*)    All other components within normal limits  BASIC METABOLIC PANEL - Abnormal; Notable for the following:    Sodium 132 (*)    All other components within normal limits  PRO B NATRIURETIC PEPTIDE  I-STAT TROPOININ, ED  I-STAT CG4 LACTIC ACID, ED   Imaging Review Dg Chest 2 View  10/20/2013   CLINICAL DATA:  Recent pneumonia now with cough and dyspnea  EXAM: CHEST  2 VIEW  COMPARISON:  CT ANGIO CHEST W/CM &/OR WO/CM dated 10/14/2013; DG CHEST 1V PORT dated 10/14/2013  FINDINGS: The lungs are well-expanded. The densities demonstrated previously at the lung bases are not evident today. The cardiac silhouette is normal in size. The pulmonary vascularity is not engorged. The mediastinum is normal in width. There is mild tortuosity of the descending thoracic aorta. There is no pleural effusion or pneumothorax. The observed portions of the bony thorax appear normal.  IMPRESSION: There  is no evidence of residual basilar pneumonia or evidence of other active cardiopulmonary disease.   Electronically Signed  By: David  Martinique   On: 10/20/2013 17:20     EKG Interpretation   Date/Time:  Friday October 20 2013 18:46:06 EDT Ventricular Rate:  73 PR Interval:  159 QRS Duration: 136 QT Interval:  429 QTC Calculation: 473 R Axis:   -20 Text Interpretation:  Sinus rhythm Probable left atrial enlargement Right  bundle branch block No significant change since last tracing Confirmed by  Amorita ([id number]) on 10/20/2013 6:50:40 PM      MDM   Final diagnoses:  Chest pain  COPD exacerbation    Patient feels significantly improved with morphine and duoneb. His wheezing resolved. No new fevers here. I feel he should be treated as a COPD exacerbation, will give rescue inhaler, steroids and encourage close f/u with PCP. His CP appears to be related to his coughing, and with benign EKG and troponin after symptoms over 24 hours I have low suspicion for ACS. Doubt PE as this is recurrent.     Ephraim Hamburger, MD 10/21/13 [phone]

## 2013-10-20 NOTE — ED Notes (Signed)
Pt here recently discharged with PNA and still having chest tightness and SOB; pt denies cough or fever

## 2013-10-21 LAB — CULTURE, BLOOD (ROUTINE X 2)
CULTURE: NO GROWTH
Culture: NO GROWTH

## 2013-10-24 ENCOUNTER — Ambulatory Visit (INDEPENDENT_AMBULATORY_CARE_PROVIDER_SITE_OTHER): Payer: BC Managed Care – PPO | Admitting: Internal Medicine

## 2013-10-24 ENCOUNTER — Encounter: Payer: Self-pay | Admitting: Internal Medicine

## 2013-10-24 VITALS — BP 146/91 | HR 77 | Temp 98.2°F | Wt 190.0 lb

## 2013-10-24 DIAGNOSIS — J189 Pneumonia, unspecified organism: Secondary | ICD-10-CM

## 2013-10-24 DIAGNOSIS — F17200 Nicotine dependence, unspecified, uncomplicated: Secondary | ICD-10-CM

## 2013-10-24 DIAGNOSIS — J9801 Acute bronchospasm: Secondary | ICD-10-CM

## 2013-10-24 DIAGNOSIS — F1010 Alcohol abuse, uncomplicated: Secondary | ICD-10-CM

## 2013-10-24 DIAGNOSIS — I4891 Unspecified atrial fibrillation: Secondary | ICD-10-CM

## 2013-10-24 DIAGNOSIS — I10 Essential (primary) hypertension: Secondary | ICD-10-CM

## 2013-10-24 MED ORDER — AMLODIPINE BESYLATE 5 MG PO TABS: 5.0000 mg | ORAL_TABLET | Freq: Every day | ORAL | Status: DC

## 2013-10-24 NOTE — Progress Notes (Signed)
Subjective:    Patient ID: Sean Riley, male    DOB: 05-Jun-1951, 63 y.o.   MRN: [id number]  DOS:  10/24/2013 Type of  visit:  Hospital followup Was admitted with pneumonia 10/14/2013, CT show no PE but bilateral consolidation, was treated in the standard fashion and was discharged home. Shortly after went to the ER with shortness or breath, repeat chest x-ray showed no infiltrate, was diagnosed with COPD exacerbation, prescribe prednisone and albuterol. He is here for followup. Other issues during the admission was elevated blood pressure, started lisinopril. His also heavy drinker, 6-8 beers at night.   ROS Since he left the emergency room department, he feels better. Still smoking a pack a day. Still drinking 6-8 beers at night Taking lisinopril as his BP was noted to be elevated to the hospital, ambulatory BPs are not back to normal ~ 140s/90s. Denies fever chills. Cough has decrease, difficult breathing has decreased. No nausea, vomiting or diarrhea   Past Medical History  Diagnosis Date  . Atrial fibrillation   . HYPERLIPIDEMIA   . 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 Microscopic hematuria     negative cystoscopy  . NONSPECIFIC ABNORMAL ELECTROCARDIOGRAM   . GERD (gastroesophageal reflux disease)   . Varicose veins     Past Surgical History  Procedure Laterality Date  . Tracheotomy      age 35 for croup  . Knee arthroscopy  2012    Dr Theda Sers  . Shoulder arthroscopy  2011    Dr Theda Sers  . Coronary angioplasty      04/24/13 : he denies angioplasty  . Appendectomy    . Lumbar laminectomy  1987    Dr Durward Fortes  . Rotator cuff repair    . Spinal fusion  1986    Dr Rolin Barry  . Myelogram  2007  . Cardiac catheterization       X3;Dr Caryl Comes, last in July, 2013  . Colonoscopy  2007     negative; Dr Sharlett Iles  . Cystoscopy  1978    Dr Hartley Barefoot    History   Social History  . Marital Status: Married    Spouse Name: N/A    Number of Children:  2  . Years of Education: N/A   Occupational History  . Retried    Social History Main Topics  . Smoking status: Current Every Day Smoker -- 1.00 packs/day for 30 years    Types: Cigarettes  . Smokeless tobacco: Not on file  . Alcohol Use: 0.0 oz/week     Comment: 6-8 cans of beer daily  . Drug Use: No  . Sexual Activity: Not on file   Other Topics Concern  . Not on file   Social History Narrative  . No narrative on file        Medication List       This list is accurate as of: 10/24/13 11:59 PM.  Always use your most recent med list.               albuterol 108 (90 BASE) MCG/ACT inhaler  Commonly known as:  PROVENTIL HFA;VENTOLIN HFA  Inhale 2 puffs into the lungs every 4 (four) hours as needed for wheezing or shortness of breath.     amLODipine 5 MG tablet  Commonly known as:  NORVASC  Take 1 tablet (5 mg total) by mouth daily.     azelastine 137 MCG/SPRAY nasal spray  Commonly known as:  ASTELIN  flecainide 100 MG tablet  Commonly known as:  TAMBOCOR  Take 100 mg by mouth 2 (two) times daily.     guaiFENesin 600 MG 12 hr tablet  Commonly known as:  MUCINEX  Take 2 tablets (1,200 mg total) by mouth 2 (two) times daily.     mometasone 50 MCG/ACT nasal spray  Commonly known as:  NASONEX  Place 2 sprays into the nose daily as needed (for allergies).     NEXIUM 40 MG capsule  Generic drug:  esomeprazole  Take 1 capsule (40 mg total) by mouth daily before breakfast.           Objective:   Physical Exam BP 146/91  Pulse 77  Temp(Src) 98.2 F (36.8 C)  Wt 190 lb (86.183 kg)  SpO2 93% General -- alert, well-developed, NAD.   HEENT-- Not pale.    Lungs -- normal respiratory effort, no intercostal retractions, no accessory muscle use, and Few rhonchi and end expiratory wheezing bilaterally. No crackles. .Heart-- normal rate, regular rhythm, no murmur.   Extremities-- no pretibial edema bilaterally  Neurologic--  alert & oriented X3. Speech normal,  gait normal, strength normal in all extremities.  Psych-- Cognition and judgment appear intact. Cooperative with normal attention span and concentration. No anxious or depressed appearing.      Assessment & Plan:   Followup 6 weeks   Today , I spent more than 45 min with the patient (new pt to me, hospital f/u),  >50% of the time counseling, and /or reviewing the chart and labs ordered by other providers

## 2013-10-24 NOTE — Patient Instructions (Signed)
Discontinue lisinopril, start amlodipine 5 mg daily.  Check the  blood pressure 2 or 3 times a week be sure it is between 110/60 and 140/85. Ideal blood pressure is 120/80. If it is consistently higher or lower, let me know   If you need more information about quitting tobacco  visit  the American [employer] Association, it  is a great resource online at:  http://www.richard-flynn.net/   Next visit in 6 weeks.

## 2013-10-24 NOTE — Progress Notes (Signed)
Pre visit review using our clinic review tool, if applicable. No additional management support is needed unless otherwise documented below in the visit note. 

## 2013-10-28 DIAGNOSIS — F1010 Alcohol abuse, uncomplicated: Secondary | ICD-10-CM | POA: Insufficient documentation

## 2013-10-28 DIAGNOSIS — J9801 Acute bronchospasm: Secondary | ICD-10-CM | POA: Insufficient documentation

## 2013-10-28 DIAGNOSIS — I10 Essential (primary) hypertension: Secondary | ICD-10-CM | POA: Insufficient documentation

## 2013-10-28 NOTE — Assessment & Plan Note (Signed)
Drinks 6-8 beers nightly, patient strongly counseled, doesn't seem to be receptive to change

## 2013-10-28 NOTE — Assessment & Plan Note (Signed)
Bronchospasm. Heavy smoker, went to the ER after he was discharged from the hospital with pneumonia (Apparently he was not taking bronchodilator ), was wheezing  bilaterally, prescribed prednisone and albuterol. Doing better. Most likely he has COPD,  Plan: Continue with albuterol as needed PFTs when he comes back.

## 2013-10-28 NOTE — Assessment & Plan Note (Signed)
No history of previous Hypertension, but elevated blood pressures at the hospital, currently on lisinopril, BP still slightly elevated. Lisinopril many affect his airways. Plan: Discontinue lisinopril, amlodipine 5 mg, monitor BPsertension

## 2013-10-28 NOTE — Assessment & Plan Note (Signed)
Atrial fibrillation, During recent admission, was noted to be in sinus rhythm, and no anticoagulation, was recommended on aspirin daily

## 2013-10-28 NOTE — Assessment & Plan Note (Signed)
Tobacco abuse ---> strongly counseled

## 2013-10-28 NOTE — Assessment & Plan Note (Signed)
Pneumonia Bilateral pneumonia per CT, improving, status post Levaquin. Subsequent chest x-ray was clear. No further eval at this time

## 2013-12-05 ENCOUNTER — Ambulatory Visit: Payer: BC Managed Care – PPO | Admitting: Internal Medicine

## 2013-12-08 ENCOUNTER — Encounter: Payer: Self-pay | Admitting: Internal Medicine

## 2013-12-08 ENCOUNTER — Ambulatory Visit (INDEPENDENT_AMBULATORY_CARE_PROVIDER_SITE_OTHER): Payer: BC Managed Care – PPO | Admitting: Internal Medicine

## 2013-12-08 ENCOUNTER — Ambulatory Visit (INDEPENDENT_AMBULATORY_CARE_PROVIDER_SITE_OTHER)
Admission: RE | Admit: 2013-12-08 | Discharge: 2013-12-08 | Disposition: A | Discharge status: Home / Self Care | Payer: BC Managed Care – PPO | Source: Ambulatory Visit | Attending: Internal Medicine | Admitting: Internal Medicine

## 2013-12-08 VITALS — BP 129/85 | HR 81 | Temp 98.3°F | Wt 187.0 lb

## 2013-12-08 DIAGNOSIS — I10 Essential (primary) hypertension: Secondary | ICD-10-CM

## 2013-12-08 DIAGNOSIS — J189 Pneumonia, unspecified organism: Secondary | ICD-10-CM

## 2013-12-08 DIAGNOSIS — J9801 Acute bronchospasm: Secondary | ICD-10-CM

## 2013-12-08 DIAGNOSIS — F1010 Alcohol abuse, uncomplicated: Secondary | ICD-10-CM

## 2013-12-08 NOTE — Progress Notes (Signed)
Subjective:    Patient ID: Sean Riley, male    DOB: Jul 03, 1951, 63 y.o.   MRN: [id number]  DOS:  12/08/2013 Type of  visit: Followup from previous visit Hypertension, on amlodipine, no ambulatory BPs, BP today is great Bronchospasm, using albuterol twice a day, not when necessary. Pneumonia, nearly asymptomatic. EtOH, unchanged.   ROS Denies fever or chills No chest pain or lower extremity edema, some difficulty breathing since he had pneumonia a few weeks ago. + cough mostly is in the morning, some white sputum production, no hemoptysis. Has not hear any wheezing but again is taking albuterol twice a day not when necessary.   Past Medical History  Diagnosis Date  . Atrial fibrillation   . HYPERLIPIDEMIA   . 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 Microscopic hematuria     negative cystoscopy  . NONSPECIFIC ABNORMAL ELECTROCARDIOGRAM   . GERD (gastroesophageal reflux disease)   . Varicose veins     Past Surgical History  Procedure Laterality Date  . Tracheotomy      age 89 for croup  . Knee arthroscopy  2012    Dr Theda Sers  . Shoulder arthroscopy  2011    Dr Theda Sers  . Coronary angioplasty      04/24/13 : he denies angioplasty  . Appendectomy    . Lumbar laminectomy  1987    Dr Durward Fortes  . Rotator cuff repair    . Spinal fusion  1986    Dr Rolin Barry  . Myelogram  2007  . Cardiac catheterization       X3;Dr Caryl Comes, last in July, 2013  . Colonoscopy  2007     negative; Dr Sharlett Iles  . Cystoscopy  1978    Dr Hartley Barefoot    History   Social History  . Marital Status: Married    Spouse Name: N/A    Number of Children: 2  . Years of Education: N/A   Occupational History  . Retried    Social History Main Topics  . Smoking status: Current Every Day Smoker -- 1.00 packs/day for 30 years    Types: Cigarettes  . Smokeless tobacco: Not on file  . Alcohol Use: 0.0 oz/week     Comment: 6-8 cans of beer daily  . Drug Use: No  . Sexual Activity:  Not on file   Other Topics Concern  . Not on file   Social History Narrative  . No narrative on file        Medication List       This list is accurate as of: 12/08/13  5:21 PM.  Always use your most recent med list.               albuterol 108 (90 BASE) MCG/ACT inhaler  Commonly known as:  PROVENTIL HFA;VENTOLIN HFA  Inhale 2 puffs into the lungs every 4 (four) hours as needed for wheezing or shortness of breath.     amLODipine 5 MG tablet  Commonly known as:  NORVASC  Take 1 tablet (5 mg total) by mouth daily.     azelastine 0.1 % nasal spray  Commonly known as:  ASTELIN     flecainide 100 MG tablet  Commonly known as:  TAMBOCOR  Take 100 mg by mouth 2 (two) times daily.     mometasone 50 MCG/ACT nasal spray  Commonly known as:  NASONEX  Place 2 sprays into the nose daily as needed (for allergies).  NEXIUM 40 MG capsule  Generic drug:  esomeprazole  Take 1 capsule (40 mg total) by mouth daily before breakfast.           Objective:   Physical Exam BP 129/85  Pulse 81  Temp(Src) 98.3 F (36.8 C)  Wt 187 lb (84.823 kg)  SpO2 97%  General -- alert, well-developed, NAD.   Lungs -- normal respiratory effort, no intercostal retractions, no accessory muscle use, and decreased  breath sounds. Few ronchi w/  Cough, slt increased expiratory time  Heart-- normal rate, regular rhythm, no murmur.  Extremities-- no pretibial edema bilaterally  Neurologic--  alert & oriented X3. Speech normal, gait normal, strength normal in all extremities.   Psych-- Cognition and judgment appear intact. Cooperative with normal attention span and concentration. No anxious or depressed appearing.     Assessment & Plan:

## 2013-12-08 NOTE — Assessment & Plan Note (Signed)
Well-controlled on amlodipine, no change. No apparent side effects

## 2013-12-08 NOTE — Assessment & Plan Note (Signed)
Seems to be recovering well, mild shortness of breath since the morning appear Chest x-ray today

## 2013-12-08 NOTE — Progress Notes (Signed)
Pre visit review using our clinic review tool, if applicable. No additional management support is needed unless otherwise documented below in the visit note. 

## 2013-12-08 NOTE — Patient Instructions (Signed)
Please get your x-ray at the other [facility]  office located at: [place], across from [facility].  Please go to the basement, this is a walk-in facility, they are open from 8:30 to 5:30 PM. Phone number [phone].   Use albuterol only as needed for cough, wheezing or chest congestion Continue with all other medications as you're doing   Next visit is for a physical exam in 5 months ,  fasting Please make an appointment

## 2013-12-08 NOTE — Assessment & Plan Note (Signed)
Unchanged, he does not believe he has an issue. See previous entry. Not motivated to change at this time.

## 2013-12-08 NOTE — Assessment & Plan Note (Addendum)
Recommend to change albuterol to as needed only. Will check PFTs, suspect the patient has COPD.

## 2013-12-14 ENCOUNTER — Encounter: Payer: Self-pay | Admitting: Internal Medicine

## 2013-12-14 ENCOUNTER — Ambulatory Visit (INDEPENDENT_AMBULATORY_CARE_PROVIDER_SITE_OTHER): Payer: BC Managed Care – PPO | Admitting: Internal Medicine

## 2013-12-14 VITALS — BP 147/93 | HR 78 | Ht 73.0 in | Wt 187.0 lb

## 2013-12-14 DIAGNOSIS — I4891 Unspecified atrial fibrillation: Secondary | ICD-10-CM

## 2013-12-14 MED ORDER — DILTIAZEM HCL ER COATED BEADS 180 MG PO CP24: 180.0000 mg | ORAL_CAPSULE | Freq: Every day | ORAL | Status: DC

## 2013-12-14 NOTE — Patient Instructions (Signed)
Your physician has recommended you make the following change in your medication:  1) STOP Amlodipine 2) START Diltiazem 180 mg daily  Your physician wants you to follow-up in: 1 year with [employer], PA-C.  You will receive a reminder letter in the mail two months in advance. If you don't receive a letter, please call our office to schedule the follow-up appointment.

## 2013-12-14 NOTE — Progress Notes (Signed)
Patient Care Team: Colon Branch, MD as PCP - General (Internal Medicine)   HPI  Sean Riley is a 63 y.o. male  Seen in follow for atrial fibrillation. He continues to take flecainide; he's been largely asymptomatic without symptomatic recurrences.  There is a concern that he may be allergic to diltiazem as he has a pruritic rash;  the rash did not resolve. He ended up with a pneumonia. His blood pressure is elevated he was started on amlodipine. He would prefer to go back on diltiazem.  He still has complaints of shortness of breath. Pulmonary function testing is pending. He denies chest pain edema or palpitations. .   There has been no syncope or presyncope.    He underwent Myoview scanning 2013 which was abnormal and underwent catheterization which was normal. He remains on flecainide  Past Medical History  Diagnosis Date  . Atrial fibrillation   . HYPERLIPIDEMIA   . 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 Microscopic hematuria     negative cystoscopy  . NONSPECIFIC ABNORMAL ELECTROCARDIOGRAM   . GERD (gastroesophageal reflux disease)   . Varicose veins     Past Surgical History  Procedure Laterality Date  . Tracheotomy      age 32 for croup  . Knee arthroscopy  2012    Dr Theda Sers  . Shoulder arthroscopy  2011    Dr Theda Sers  . Coronary angioplasty      04/24/13 : he denies angioplasty  . Appendectomy    . Lumbar laminectomy  1987    Dr Durward Fortes  . Rotator cuff repair    . Spinal fusion  1986    Dr Rolin Barry  . Myelogram  2007  . Cardiac catheterization       X3;Dr Caryl Comes, last in July, 2013  . Colonoscopy  2007     negative; Dr Sharlett Iles  . Cystoscopy  1978    Dr Hartley Barefoot    Current Outpatient Prescriptions  Medication Sig Dispense Refill  . albuterol (PROVENTIL HFA;VENTOLIN HFA) 108 (90 BASE) MCG/ACT inhaler Inhale 2 puffs into the lungs every 4 (four) hours as needed for wheezing or shortness of breath.  1 Inhaler  0  . amLODipine  (NORVASC) 5 MG tablet Take 1 tablet (5 mg total) by mouth daily.  30 tablet  2  . azelastine (ASTELIN) 137 MCG/SPRAY nasal spray Place 1 spray into both nostrils as needed.       Marland Kitchen esomeprazole (NEXIUM) 40 MG capsule Take 1 capsule (40 mg total) by mouth daily before breakfast.      . flecainide (TAMBOCOR) 100 MG tablet Take 100 mg by mouth 2 (two) times daily.       . fluticasone (FLONASE) 50 MCG/ACT nasal spray Place 1 spray into both nostrils as needed for allergies or rhinitis.      . mometasone (NASONEX) 50 MCG/ACT nasal spray Place 2 sprays into the nose daily as needed (for allergies).       No current facility-administered medications for this visit.    Allergies  Allergen Reactions  . Simvastatin Other (See Comments)    REACTION: mental status changes  . Celecoxib Other (See Comments)    REACTION: GI UPSET AND INFLAMMATION  . Codeine     REACTION: GI UPSET AND INFLAMMATION  . Nsaids Other (See Comments)    REACTION: GI UPSET AND INFLAMMATION    Review of Systems negative except from HPI and PMH  Physical  Exam BP 147/93  Pulse 78  Ht 6\' 1"  (1.854 m)  Wt 187 lb (84.823 kg)  BMI 24.68 kg/m2 Well developed and well nourished in no acute distress HENT normal E scleral and icterus clear Neck Supple JVP flat; carotids brisk and full Clear to ausculation  Regular rate and rhythm, no murmurs gallops or rub Soft with active bowel sounds No clubbing cyanosis  Edema Alert and oriented, grossly normal motor and sensory function Skin Warm and Dry  ECG demonstrated sinus rhythm at 78 Interval 17/11/39 Axis leftward at -16 right bundle branch block  Assessment and Plan  Atrial fibrillation  Hypertension  DOE  Overall been quite well. He has a CHADS-VASc score of one for hypertension and so it is reasonable at this point for him not to be on anticoagulation. It would be that he be on a nodal blocking agent and he would prefer to go back on diltiazem; hence, we will  discontinue his amlodipine reason to diltiazem 180.  He's continuing to stroke or shortness of breath. There is no evidence of volume overload. In the event that his pulmonary function testing is unrevealing, I would plan to do anechocardiogram

## 2013-12-18 ENCOUNTER — Other Ambulatory Visit: Payer: Self-pay | Admitting: *Deleted

## 2013-12-18 MED ORDER — FLECAINIDE ACETATE 100 MG PO TABS: 100.0000 mg | ORAL_TABLET | Freq: Two times a day (BID) | ORAL | Status: DC

## 2013-12-25 ENCOUNTER — Ambulatory Visit (INDEPENDENT_AMBULATORY_CARE_PROVIDER_SITE_OTHER): Payer: BC Managed Care – PPO | Admitting: Internal Medicine

## 2013-12-25 DIAGNOSIS — J9801 Acute bronchospasm: Secondary | ICD-10-CM

## 2013-12-25 NOTE — Progress Notes (Signed)
PFT done today. 

## 2013-12-27 LAB — PULMONARY FUNCTION TEST
DL/VA % pred: 77 %
DL/VA: 3.6 ml/min/mmHg/L
DLCO UNC % PRED: 72 %
DLCO unc: 24.44 ml/min/mmHg
FEF 25-75 POST: 2.24 L/s
FEF 25-75 Pre: 1.99 L/sec
FEF2575-%Change-Post: 12 %
FEF2575-%PRED-POST: 78 %
FEF2575-%Pred-Pre: 69 %
FEV1-%CHANGE-POST: 3 %
FEV1-%PRED-POST: 97 %
FEV1-%Pred-Pre: 93 %
FEV1-POST: 3.51 L
FEV1-PRE: 3.38 L
FEV1FVC-%CHANGE-POST: 1 %
FEV1FVC-%Pred-Pre: 92 %
FEV6-%Change-Post: 1 %
FEV6-%Pred-Post: 106 %
FEV6-%Pred-Pre: 104 %
FEV6-POST: 4.85 L
FEV6-Pre: 4.77 L
FEV6FVC-%CHANGE-POST: 0 %
FEV6FVC-%PRED-PRE: 103 %
FEV6FVC-%Pred-Post: 103 %
FVC-%Change-Post: 1 %
FVC-%PRED-PRE: 101 %
FVC-%Pred-Post: 103 %
FVC-Post: 4.96 L
FVC-Pre: 4.86 L
PRE FEV1/FVC RATIO: 70 %
PRE FEV6/FVC RATIO: 98 %
Post FEV1/FVC ratio: 71 %
Post FEV6/FVC ratio: 98 %
RV % PRED: 142 %
RV: 3.39 L
TLC % pred: 114 %
TLC: 8.27 L

## 2014-04-10 ENCOUNTER — Other Ambulatory Visit: Payer: Self-pay | Admitting: *Deleted

## 2014-04-10 MED ORDER — DILTIAZEM HCL ER COATED BEADS 180 MG PO CP24: 180.0000 mg | ORAL_CAPSULE | Freq: Every day | ORAL | Status: DC

## 2014-05-10 ENCOUNTER — Ambulatory Visit (INDEPENDENT_AMBULATORY_CARE_PROVIDER_SITE_OTHER): Payer: BC Managed Care – PPO | Admitting: Internal Medicine

## 2014-05-10 ENCOUNTER — Encounter: Payer: Self-pay | Admitting: Internal Medicine

## 2014-05-10 VITALS — BP 162/84 | HR 73 | Temp 98.1°F | Ht 72.0 in | Wt 190.4 lb

## 2014-05-10 DIAGNOSIS — R9389 Abnormal findings on diagnostic imaging of other specified body structures: Secondary | ICD-10-CM

## 2014-05-10 DIAGNOSIS — Z23 Encounter for immunization: Secondary | ICD-10-CM

## 2014-05-10 DIAGNOSIS — I83009 Varicose veins of unspecified lower extremity with ulcer of unspecified site: Secondary | ICD-10-CM

## 2014-05-10 DIAGNOSIS — N529 Male erectile dysfunction, unspecified: Secondary | ICD-10-CM | POA: Insufficient documentation

## 2014-05-10 DIAGNOSIS — L309 Dermatitis, unspecified: Secondary | ICD-10-CM

## 2014-05-10 DIAGNOSIS — N528 Other male erectile dysfunction: Secondary | ICD-10-CM

## 2014-05-10 DIAGNOSIS — Z0000 Encounter for general adult medical examination without abnormal findings: Secondary | ICD-10-CM

## 2014-05-10 DIAGNOSIS — J9801 Acute bronchospasm: Secondary | ICD-10-CM

## 2014-05-10 DIAGNOSIS — E785 Hyperlipidemia, unspecified: Secondary | ICD-10-CM

## 2014-05-10 DIAGNOSIS — I10 Essential (primary) hypertension: Secondary | ICD-10-CM

## 2014-05-10 DIAGNOSIS — N400 Enlarged prostate without lower urinary tract symptoms: Secondary | ICD-10-CM

## 2014-05-10 DIAGNOSIS — L97909 Non-pressure chronic ulcer of unspecified part of unspecified lower leg with unspecified severity: Secondary | ICD-10-CM

## 2014-05-10 LAB — PSA: PSA: 0.53 ng/mL (ref 0.10–4.00)

## 2014-05-10 LAB — COMPREHENSIVE METABOLIC PANEL
ALBUMIN: 4 g/dL (ref 3.5–5.2)
ALT: 17 U/L (ref 0–53)
AST: 17 U/L (ref 0–37)
Alkaline Phosphatase: 86 U/L (ref 39–117)
BUN: 8 mg/dL (ref 6–23)
CO2: 26 meq/L (ref 19–32)
Calcium: 9.1 mg/dL (ref 8.4–10.5)
Chloride: 97 mEq/L (ref 96–112)
Creatinine, Ser: 0.8 mg/dL (ref 0.4–1.5)
GFR: 103.29 mL/min (ref >60.00)
GLUCOSE: 80 mg/dL (ref 70–99)
POTASSIUM: 4.3 meq/L (ref 3.5–5.1)
Sodium: 131 mEq/L — ABNORMAL LOW (ref 135–145)
Total Bilirubin: 1.1 mg/dL (ref 0.2–1.2)
Total Protein: 7.4 g/dL (ref 6.0–8.3)

## 2014-05-10 LAB — LIPID PANEL
CHOLESTEROL: 229 mg/dL — AB (ref 0–200)
HDL: 44.4 mg/dL (ref >39.00)
LDL CALC: 155 mg/dL — AB (ref 0–99)
NonHDL: 184.6
Total CHOL/HDL Ratio: 5
Triglycerides: 146 mg/dL (ref 0.0–149.0)
VLDL: 29.2 mg/dL (ref 0.0–40.0)

## 2014-05-10 MED ORDER — SILDENAFIL CITRATE 100 MG PO TABS: 50.0000 mg | ORAL_TABLET | Freq: Every day | ORAL | Status: DC | PRN

## 2014-05-10 NOTE — Progress Notes (Signed)
Pre visit review using our clinic review tool, if applicable. No additional management support is needed unless otherwise documented below in the visit note. 

## 2014-05-10 NOTE — Assessment & Plan Note (Addendum)
DRE with minimal enlarge prostate, no  symptoms. Check PSA

## 2014-05-10 NOTE — Progress Notes (Signed)
Subjective:    Patient ID: Sean Riley, male    DOB: 10/17/1950, 63 y.o.   MRN: [id number]  DOS:  05/10/2014 Type of visit - description : CPX Interval history: Has chronic dermatitis of the lower extremities and also 3 weeks ago developed bilateral hand rash due to exposure to some chemicals, went to see dermatology, prescribing high potency local steroids and is better. Needs a refill on Viagra.    ROS Denies chest pain or difficulty breathing No nausea, vomiting, diarrhea or blood in the stools. GERD-- symptoms well controlled, on PPIs Very rare has any cough, no sputum production, no hemoptysis No dysuria, gross hematuria or difficulty urinating   Past Medical History  Diagnosis Date  . Atrial fibrillation   . HYPERLIPIDEMIA   . 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 Microscopic hematuria     negative cystoscopy  . NONSPECIFIC ABNORMAL ELECTROCARDIOGRAM   . GERD (gastroesophageal reflux disease)   . Varicose veins     Past Surgical History  Procedure Laterality Date  . Tracheotomy      age 63 for croup  . Knee arthroscopy  2012    Dr Theda Sers  . Shoulder arthroscopy  2011    Dr Theda Sers  . Coronary angioplasty      04/24/13 : he denies angioplasty  . Appendectomy    . Lumbar laminectomy  1987    Dr Durward Fortes  . Rotator cuff repair    . Spinal fusion  1986    Dr Rolin Barry  . Myelogram  2007  . Cardiac catheterization       X3;Dr Caryl Comes, last in July, 2013  . Colonoscopy  last 06-2013     Dr Sharlett Iles  . Cystoscopy  1978    Dr Hartley Barefoot    History   Social History  . Marital Status: Married    Spouse Name: N/A    Number of Children: 2  . Years of Education: N/A   Occupational History  . Retried    Social History Main Topics  . Smoking status: Current Every Day Smoker -- 1.00 packs/day for 30 years    Types: Cigarettes  . Smokeless tobacco: Not on file     Comment: 1 ppd   . Alcohol Use: 0.0 oz/week     Comment: 6-7 cans of beer  daily  . Drug Use: No  . Sexual Activity: Not on file   Other Topics Concern  . Not on file   Social History Narrative   Lives w/ wife     Family History  Problem Relation Age of Onset  . Hypertension Mother   . Hypertension Father   . Benign prostatic hyperplasia Father     S/P TURP  . Heart attack Maternal Grandmother     MI in 25s  . Breast cancer Maternal Grandmother   . Arrhythmia Brother      X 2  . Heart attack Maternal Aunt     MI in 22s  . Stroke Neg Hx   . Diabetes Neg Hx   . Colon cancer Neg Hx   . Colon polyps Neg Hx   . Stomach cancer Neg Hx   . Rectal cancer Neg Hx   . Esophageal cancer Neg Hx   . Prostate cancer Neg Hx        Medication List       This list is accurate as of: 05/10/14 11:59 PM.  Always use your most recent med list.  albuterol 108 (90 BASE) MCG/ACT inhaler  Commonly known as:  PROVENTIL HFA;VENTOLIN HFA  Inhale 2 puffs into the lungs every 4 (four) hours as needed for wheezing or shortness of breath.     azelastine 0.1 % nasal spray  Commonly known as:  ASTELIN  Place 1 spray into both nostrils as needed.     calcipotriene-betamethasone ointment  Commonly known as:  TACLONEX  Apply 1 application topically daily.     diltiazem 180 MG 24 hr capsule  Commonly known as:  CARDIZEM CD  Take 1 capsule (180 mg total) by mouth daily.     flecainide 100 MG tablet  Commonly known as:  TAMBOCOR  Take 1 tablet (100 mg total) by mouth 2 (two) times daily.     fluticasone 50 MCG/ACT nasal spray  Commonly known as:  FLONASE  Place 1 spray into both nostrils as needed for allergies or rhinitis.     NEXIUM 40 MG capsule  Generic drug:  esomeprazole  Take 1 capsule (40 mg total) by mouth daily before breakfast.     sildenafil 100 MG tablet  Commonly known as:  VIAGRA  Take 0.5-1 tablets (50-100 mg total) by mouth daily as needed for erectile dysfunction.           Objective:   Physical Exam BP 162/84  Pulse  73  Temp(Src) 98.1 F (36.7 C) (Oral)  Ht 6' (1.829 m)  Wt 190 lb 6 oz (86.354 kg)  BMI 25.81 kg/m2  SpO2 96% General -- alert, well-developed, NAD.  Neck --no thyromegaly , normal carotid pulse  HEENT-- Not pale.   Lungs -- normal respiratory effort, no intercostal retractions, no accessory muscle use, and normal breath sounds. (few ronchi) Heart-- seems  regular today, no murmur.  Abdomen-- Not distended, good bowel sounds,soft, non-tender. Rectal-- No external abnormalities noted. Normal sphincter tone. No rectal masses or tenderness. Stool brown   Prostate--Prostate gland firm and smooth, slt  enlargement, no nodularity, tenderness, mass, asymmetry or induration. Extremities-- no pretibial edema bilaterally , good pedal pulses B Skin--  At the distal  lower extremities has violaceous macular  patches which are chronic; Both hands without rash in a global distribution Neurologic--  alert & oriented X3. Speech normal, gait appropriate for age, strength symmetric and appropriate for age.  Psych-- Cognition and judgment appear intact. Cooperative with normal attention span and concentration. No anxious or depressed appearing.        Assessment & Plan:

## 2014-05-10 NOTE — Assessment & Plan Note (Addendum)
Td 09 pnm 23--- 2015 prevnar-- today Flu shot--today Last Cscope 06-2013, 2 polyps, next 1 year Discussed diet, exercise, etoh and  tobacco abuse

## 2014-05-10 NOTE — Assessment & Plan Note (Signed)
BP elevated today, ambulatory BPs normal, no change for now. Will ask patient to monitor ambulatory BPs

## 2014-05-10 NOTE — Assessment & Plan Note (Signed)
Request a refill on Viagra which he took before without apparent side effects.

## 2014-05-10 NOTE — Assessment & Plan Note (Signed)
S/p  laser ablation of bilateral great saphenous veins in 2013. Following his right GSV ablation he had a good closure of the great saphenous vein but there was thrombus protruding into the common femoral vein with no obstruction. S/p coumadin (2013)

## 2014-05-10 NOTE — Assessment & Plan Note (Signed)
PFTs show minimal COPD, on as needed albuterol.

## 2014-05-10 NOTE — Assessment & Plan Note (Addendum)
abnormal chest x-ray 12/2013--->  granuloma, see report below. Consider repeat a chest x-ray next year. ---- Lungs are adequately inflated with no change in a 3 mm dense nodule  over the lateral right upper lung likely a granuloma. There is no  focal airspace consolidation or effusion. Cardiomediastinal  silhouette and remainder of the exam is unchanged.  IMPRESSION:  No acute cardiopulmonary disease.

## 2014-05-10 NOTE — Patient Instructions (Signed)
Get your blood work before you leave   Check the  blood pressure 2 or 3 times a  week be sure it is between 110/60 and 140/85. Ideal blood pressure is 120/80. If it is consistently higher or lower, let me know  Please come back to the office in 4 months for a routine check up , no fasting    Stop by the front desk and schedule the visit

## 2014-05-11 ENCOUNTER — Telehealth: Payer: Self-pay | Admitting: *Deleted

## 2014-05-11 ENCOUNTER — Telehealth: Payer: Self-pay | Admitting: Internal Medicine

## 2014-05-11 DIAGNOSIS — L309 Dermatitis, unspecified: Secondary | ICD-10-CM | POA: Insufficient documentation

## 2014-05-11 NOTE — Telephone Encounter (Signed)
PA effective 04/11/2014 through 05/10/2017. JG//CMA

## 2014-05-11 NOTE — Telephone Encounter (Signed)
PA for Viagra approved through [employer]. Awaiting fax confirmation to verify coverage dates. JG//CMA

## 2014-05-11 NOTE — Telephone Encounter (Signed)
emmi emailed °

## 2014-05-11 NOTE — Assessment & Plan Note (Signed)
Chronic patchy dermatitis on the lower extremities, related to varicose veins?

## 2014-05-15 ENCOUNTER — Encounter: Payer: Self-pay | Admitting: Internal Medicine

## 2014-05-15 MED ORDER — ROSUVASTATIN CALCIUM 5 MG PO TABS: 5.0000 mg | ORAL_TABLET | Freq: Every day | ORAL | Status: DC

## 2014-05-15 NOTE — Addendum Note (Signed)
Addended by: Wilfrid Lund on: 05/15/2014 09:55 AM   Modules accepted: Orders

## 2014-05-25 ENCOUNTER — Other Ambulatory Visit: Payer: Self-pay

## 2014-06-26 ENCOUNTER — Other Ambulatory Visit: Payer: BC Managed Care – PPO

## 2014-06-27 ENCOUNTER — Other Ambulatory Visit: Payer: BC Managed Care – PPO

## 2014-09-10 ENCOUNTER — Encounter: Payer: Self-pay | Admitting: Internal Medicine

## 2014-09-10 ENCOUNTER — Ambulatory Visit (INDEPENDENT_AMBULATORY_CARE_PROVIDER_SITE_OTHER): Payer: BLUE CROSS/BLUE SHIELD | Admitting: Internal Medicine

## 2014-09-10 VITALS — BP 128/78 | HR 78 | Temp 97.7°F | Ht 72.0 in | Wt 188.5 lb

## 2014-09-10 DIAGNOSIS — F17200 Nicotine dependence, unspecified, uncomplicated: Secondary | ICD-10-CM

## 2014-09-10 DIAGNOSIS — I480 Paroxysmal atrial fibrillation: Secondary | ICD-10-CM

## 2014-09-10 DIAGNOSIS — E785 Hyperlipidemia, unspecified: Secondary | ICD-10-CM

## 2014-09-10 DIAGNOSIS — I10 Essential (primary) hypertension: Secondary | ICD-10-CM

## 2014-09-10 DIAGNOSIS — J309 Allergic rhinitis, unspecified: Secondary | ICD-10-CM | POA: Insufficient documentation

## 2014-09-10 NOTE — Progress Notes (Signed)
Subjective:    Patient ID: Sean Riley, male    DOB: March 30, 1951, 64 y.o.   MRN: [id number]  DOS:  09/10/2014 Type of visit - description : rov Interval history: Hyperlipidemia, took Crestor for a while and then quit. Atrial fibrillation, self discontinue Cardizem, reports he has a rash when on it. No symptoms c/w a fib. Hypertension, again off Cardizem, ambulatory BPs in the 140s.  ROS Denies fever chills, has allergic rhinitis with ongoing symptoms despite taking medications appropriately No chest pain or difficulty breathing. No palpitations. Occasional cough, he  feels the cough is triggered by postnasal dripping, no hemoptysis or actual sputum production  Past Medical History  Diagnosis Date  . Atrial fibrillation   . HYPERLIPIDEMIA   . 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 Microscopic hematuria     negative cystoscopy  . NONSPECIFIC ABNORMAL ELECTROCARDIOGRAM   . GERD (gastroesophageal reflux disease)   . Varicose veins     Past Surgical History  Procedure Laterality Date  . Tracheotomy      age 65 for croup  . Knee arthroscopy  2012    Dr Theda Sers  . Shoulder arthroscopy  2011    Dr Theda Sers  . Coronary angioplasty      04/24/13 : he denies angioplasty  . Appendectomy    . Lumbar laminectomy  1987    Dr Durward Fortes  . Rotator cuff repair    . Spinal fusion  1986    Dr Rolin Barry  . Myelogram  2007  . Cardiac catheterization       X3;Dr Caryl Comes, last in July, 2013  . Colonoscopy  last 06-2013     Dr Sharlett Iles  . Cystoscopy  1978    Dr Hartley Barefoot    History   Social History  . Marital Status: Married    Spouse Name: N/A    Number of Children: 2  . Years of Education: N/A   Occupational History  . Retried    Social History Main Topics  . Smoking status: Current Every Day Smoker -- 1.00 packs/day for 30 years    Types: Cigarettes  . Smokeless tobacco: Not on file     Comment: 1 ppd   . Alcohol Use: 0.0 oz/week     Comment: 6-7 cans of  beer daily  . Drug Use: No  . Sexual Activity: Not on file   Other Topics Concern  . Not on file   Social History Narrative   Lives w/ wife        Medication List       This list is accurate as of: 09/10/14  7:05 PM.  Always use your most recent med list.               albuterol 108 (90 BASE) MCG/ACT inhaler  Commonly known as:  PROVENTIL HFA;VENTOLIN HFA  Inhale 2 puffs into the lungs every 4 (four) hours as needed for wheezing or shortness of breath.     amLODipine 5 MG tablet  Commonly known as:  NORVASC  Take 5 mg by mouth daily.     azelastine 0.1 % nasal spray  Commonly known as:  ASTELIN  Place 1 spray into both nostrils as needed.     calcipotriene-betamethasone ointment  Commonly known as:  TACLONEX  Apply 1 application topically daily.     flecainide 100 MG tablet  Commonly known as:  TAMBOCOR  Take 1 tablet (100 mg total) by mouth 2 (two)  times daily.     fluticasone 50 MCG/ACT nasal spray  Commonly known as:  FLONASE  Place 1 spray into both nostrils as needed for allergies or rhinitis.     NEXIUM 40 MG capsule  Generic drug:  esomeprazole  Take 1 capsule (40 mg total) by mouth daily before breakfast.     rosuvastatin 5 MG tablet  Commonly known as:  CRESTOR  Take 1 tablet (5 mg total) by mouth daily.     sildenafil 100 MG tablet  Commonly known as:  VIAGRA  Take 0.5-1 tablets (50-100 mg total) by mouth daily as needed for erectile dysfunction.           Objective:   Physical Exam  Constitutional: He is oriented to person, place, and time. He appears well-developed and well-nourished. No distress.  HENT:  Head: Normocephalic and atraumatic.  Nose congested w/o d/c   Cardiovascular:  RRR, no rub or gallop  Pulmonary/Chest: Effort normal. No respiratory distress. He has no wheezes. He has no rales.  CTA B, BS decreased   Musculoskeletal: Normal range of motion. He exhibits no edema or tenderness.  Neurological: He is alert and oriented  to person, place, and time. No cranial nerve deficit. He exhibits normal muscle tone. Coordination normal.  Speech normal, gait unassisted and normal for age, motor strength appropriate for age   Skin: Skin is warm and dry. No pallor.  Psychiatric: He has a normal mood and affect. His behavior is normal. Judgment and thought content normal.   BP 128/78 mmHg  Pulse 78  Temp(Src) 97.7 F (36.5 C) (Oral)  Ht 6' (1.829 m)  Wt 188 lb 8 oz (85.503 kg)  BMI 25.56 kg/m2  SpO2 96%       Assessment & Plan:

## 2014-09-10 NOTE — Assessment & Plan Note (Addendum)
On Tambocor, self discontinue Cardizem because a rash, now on amlodipine 5 mg. He remains asymptomatic and pulse is 78. Plan: No change, next visit with cardiology 12/2014

## 2014-09-10 NOTE — Patient Instructions (Addendum)
Stop by the front desk and schedule labs to be done within in 6-8 weeks (fasting)  Please come back to the office 6 months  for a routine check up

## 2014-09-10 NOTE — Progress Notes (Signed)
Pre visit review using our clinic review tool, if applicable. No additional management support is needed unless otherwise documented below in the visit note. 

## 2014-09-10 NOTE — Assessment & Plan Note (Signed)
Self discontinued  cardizem due to a rash, now on amlodipine, BP today is great. Ambulatory BPs in the 140s. Plan: no change

## 2014-09-10 NOTE — Assessment & Plan Note (Signed)
Ongoing symptoms despite using nasal steroids and Astelin, recommend to discuss with his allergist Dr.Kozlow

## 2014-09-10 NOTE — Assessment & Plan Note (Addendum)
Base on last cholesterol panel he was prescribed Crestor 5 mg, took it for a while, self discontinue ,  no apparent reason or s/e per pt  Plan: Restart Crestor, labs in 2 months.

## 2014-09-10 NOTE — Assessment & Plan Note (Signed)
Continue smoking, today he told me he is not interested to discuss the issue

## 2014-09-15 ENCOUNTER — Other Ambulatory Visit: Payer: Self-pay | Admitting: Internal Medicine

## 2014-10-10 ENCOUNTER — Other Ambulatory Visit: Payer: Self-pay

## 2014-10-10 ENCOUNTER — Encounter: Payer: Self-pay | Admitting: Internal Medicine

## 2014-10-10 ENCOUNTER — Ambulatory Visit (INDEPENDENT_AMBULATORY_CARE_PROVIDER_SITE_OTHER): Payer: BLUE CROSS/BLUE SHIELD | Admitting: Internal Medicine

## 2014-10-10 VITALS — BP 156/94 | HR 78 | Temp 98.2°F | Ht 72.0 in | Wt 189.5 lb

## 2014-10-10 DIAGNOSIS — N62 Hypertrophy of breast: Secondary | ICD-10-CM | POA: Insufficient documentation

## 2014-10-10 LAB — LUTEINIZING HORMONE: LH: 6.01 m[IU]/mL (ref 1.50–9.30)

## 2014-10-10 NOTE — Progress Notes (Signed)
Pre visit review using our clinic review tool, if applicable. No additional management support is needed unless otherwise documented below in the visit note. 

## 2014-10-10 NOTE — Patient Instructions (Signed)
Get your blood work before you leave    

## 2014-10-10 NOTE — Progress Notes (Signed)
Subjective:    Patient ID: Sean Riley, male    DOB: [birth year], 64 y.o.   MRN: [id number]  DOS:  10/10/2014 Type of visit - description : To discuss breast enlargement. Here with his wife. Interval history: Noted a right breast enlargement since October, mild tenderness to palpation. He denies any injury, discharge, bleeding from the area. Left breast normal   Review of Systems He continued drinking heavily. Good compliance of medication Denies taking any OTC supplements except what is prescribed to him. No testicular pain or swelling.  Past Medical History  Diagnosis Date  . Atrial fibrillation   . HYPERLIPIDEMIA   . 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 Microscopic hematuria     negative cystoscopy  . NONSPECIFIC ABNORMAL ELECTROCARDIOGRAM   . GERD (gastroesophageal reflux disease)   . Varicose veins     Past Surgical History  Procedure Laterality Date  . Tracheotomy      age 69 for croup  . Knee arthroscopy  2012    Dr Theda Sers  . Shoulder arthroscopy  2011    Dr Theda Sers  . Coronary angioplasty      04/24/13 : he denies angioplasty  . Appendectomy    . Lumbar laminectomy  1987    Dr Durward Fortes  . Rotator cuff repair    . Spinal fusion  1986    Dr Rolin Barry  . Myelogram  2007  . Cardiac catheterization       X3;Dr Caryl Comes, last in July, 2013  . Colonoscopy  last 06-2013     Dr Sharlett Iles  . Cystoscopy  1978    Dr Hartley Barefoot    History   Social History  . Marital Status: Married    Spouse Name: N/A  . Number of Children: 2  . Years of Education: N/A   Occupational History  . Retried    Social History Main Topics  . Smoking status: Current Every Day Smoker -- 1.00 packs/day for 30 years    Types: Cigarettes  . Smokeless tobacco: Not on file     Comment: 1 ppd   . Alcohol Use: 0.0 oz/week     Comment: 6-7 cans of beer daily  . Drug Use: No  . Sexual Activity: Not on file   Other Topics Concern  . Not on file   Social History  Narrative   Lives w/ wife        Medication List       This list is accurate as of: 10/10/14 11:59 PM.  Always use your most recent med list.               albuterol 108 (90 BASE) MCG/ACT inhaler  Commonly known as:  PROVENTIL HFA;VENTOLIN HFA  Inhale 2 puffs into the lungs every 4 (four) hours as needed for wheezing or shortness of breath.     amLODipine 5 MG tablet  Commonly known as:  NORVASC  TAKE 1 TABLET BY MOUTH DAILY.     azelastine 0.1 % nasal spray  Commonly known as:  ASTELIN  Place 1 spray into both nostrils as needed.     calcipotriene-betamethasone ointment  Commonly known as:  TACLONEX  Apply 1 application topically daily.     flecainide 100 MG tablet  Commonly known as:  TAMBOCOR  Take 1 tablet (100 mg total) by mouth 2 (two) times daily.     fluticasone 50 MCG/ACT nasal spray  Commonly known as:  FLONASE  Place 1  spray into both nostrils as needed for allergies or rhinitis.     NEXIUM 40 MG capsule  Generic drug:  esomeprazole  Take 1 capsule (40 mg total) by mouth daily before breakfast.     rosuvastatin 5 MG tablet  Commonly known as:  CRESTOR  Take 1 tablet (5 mg total) by mouth daily.     sildenafil 100 MG tablet  Commonly known as:  VIAGRA  Take 0.5-1 tablets (50-100 mg total) by mouth daily as needed for erectile dysfunction.           Objective:   Physical Exam BP 156/94 mmHg  Pulse 78  Temp(Src) 98.2 F (36.8 C) (Oral)  Ht 6' (1.829 m)  Wt 189 lb 8 oz (85.957 kg)  BMI 25.70 kg/m2  SpO2 94%  General:   Well developed, well nourished . NAD.  HEENT:  Normocephalic . Face symmetric, atraumatic Chest: Left niple normal, essentially no breast tissue Right niple: Normal to inspection, no discharge or bleeding. He does have a approximately 32 cm breast tissue, there is an area 7:00 that seems indurated and slightly tender. No fluctuance or warmness. No axillary LAD his Skin: Not pale. Not jaundice GU: Penis normal, testicles  without masses and not tender. He has prominent epididymis on the left side, nontender Neurologic:  alert & oriented X3.  Speech normal, gait appropriate for age and unassisted Psych--  Cognition and judgment appear intact.  Cooperative with normal attention span and concentration.  Behavior appropriate. No anxious or depressed appearing.     Assessment & Plan:

## 2014-10-10 NOTE — Assessment & Plan Note (Addendum)
Seems to have true gynecomastia. Not taking any supplements OTC, he takes amlodipine which is a calcium channel blocker but usually not implicated in gynecomastia. He does drink alcohol excessively and that may account for the problem. There is an area I'm concern about 7:00 (see physical exam). Plan:  Testosterone, LH, hCG, estradiol, prolactin. We'll get a mammogram and ultrasound Further advice would result, he may need to be referred to endocrinology or surgery

## 2014-10-11 LAB — PROLACTIN: PROLACTIN: 6.6 ng/mL (ref 2.1–17.1)

## 2014-10-11 LAB — HCG, SERUM, QUALITATIVE: Preg, Serum: NEGATIVE

## 2014-10-11 LAB — TESTOSTERONE, FREE, TOTAL, SHBG
SEX HORMONE BINDING: 39 nmol/L (ref 22–77)
TESTOSTERONE FREE: 43.4 pg/mL — AB (ref 47.0–244.0)
Testosterone-% Free: 1.7 % (ref 1.6–2.9)
Testosterone: 249 ng/dL — ABNORMAL LOW (ref 300–890)

## 2014-10-11 LAB — ESTRADIOL: ESTRADIOL: 23.9 pg/mL

## 2014-10-13 ENCOUNTER — Encounter: Payer: Self-pay | Admitting: Internal Medicine

## 2014-10-15 NOTE — Addendum Note (Signed)
Addended by: Wilfrid Lund on: 10/15/2014 09:04 AM   Modules accepted: Orders

## 2014-10-19 ENCOUNTER — Other Ambulatory Visit: Payer: Self-pay | Admitting: Internal Medicine

## 2014-10-19 ENCOUNTER — Ambulatory Visit
Admission: RE | Admit: 2014-10-19 | Discharge: 2014-10-19 | Disposition: A | Discharge status: Home / Self Care | Payer: BLUE CROSS/BLUE SHIELD | Source: Ambulatory Visit | Attending: Internal Medicine | Admitting: Internal Medicine

## 2014-10-19 DIAGNOSIS — N62 Hypertrophy of breast: Secondary | ICD-10-CM

## 2014-10-22 ENCOUNTER — Encounter: Payer: Self-pay | Admitting: Endocrinology

## 2014-10-22 ENCOUNTER — Ambulatory Visit (INDEPENDENT_AMBULATORY_CARE_PROVIDER_SITE_OTHER): Payer: BLUE CROSS/BLUE SHIELD | Admitting: Endocrinology

## 2014-10-22 ENCOUNTER — Other Ambulatory Visit (INDEPENDENT_AMBULATORY_CARE_PROVIDER_SITE_OTHER): Payer: BLUE CROSS/BLUE SHIELD

## 2014-10-22 VITALS — BP 136/74 | HR 76 | Temp 98.6°F | Ht 72.0 in | Wt 190.0 lb

## 2014-10-22 DIAGNOSIS — E785 Hyperlipidemia, unspecified: Secondary | ICD-10-CM

## 2014-10-22 DIAGNOSIS — N62 Hypertrophy of breast: Secondary | ICD-10-CM

## 2014-10-22 LAB — LIPID PANEL
CHOLESTEROL: 154 mg/dL (ref 0–200)
HDL: 51.4 mg/dL (ref >39.00)
LDL Cholesterol: 85 mg/dL (ref 0–99)
NonHDL: 102.6
TRIGLYCERIDES: 90 mg/dL (ref 0.0–149.0)
Total CHOL/HDL Ratio: 3
VLDL: 18 mg/dL (ref 0.0–40.0)

## 2014-10-22 LAB — AST: AST: 13 U/L (ref 0–37)

## 2014-10-22 LAB — ALT: ALT: 13 U/L (ref 0–53)

## 2014-10-22 MED ORDER — TAMOXIFEN CITRATE 10 MG PO TABS: 10.0000 mg | ORAL_TABLET | Freq: Two times a day (BID) | ORAL | Status: DC

## 2014-10-22 NOTE — Progress Notes (Signed)
Subjective:    Patient ID: Sean Riley, male    DOB: 1951/07/14, 63 y.o.   MRN: [id number]  HPI Pt states 5 months of slight swelling of the breasts, and assoc pain.   Pt reports he had puberty at the normal age.  He has 2 biological children.  He says he has never taken illicit androgens.  He has never been on any prescribed medication for hypogonadism.  He denies any h/o infertility.  He has never had surgery, or a serious injury to the head or genital area.   He has never had liver disease, kidney disease, cancer, cystic fibrosis, ulcerative colitis, or BPH. He has never taken cimetidine, growth hormone, risperidone, hCG, opioids, androgens, 5-alpha-reductase inhibitors, cancer chemotherapy, estrogens, or ketoconazole.    He says he drinks too much beer.  Past Medical History  Diagnosis Date  . Atrial fibrillation   . HYPERLIPIDEMIA   . 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 Microscopic hematuria     negative cystoscopy  . NONSPECIFIC ABNORMAL ELECTROCARDIOGRAM   . GERD (gastroesophageal reflux disease)   . Varicose veins     Past Surgical History  Procedure Laterality Date  . Tracheotomy      age 63 for croup  . Knee arthroscopy  2012    Dr Theda Sers  . Shoulder arthroscopy  2011    Dr Theda Sers  . Coronary angioplasty      04/24/13 : he denies angioplasty  . Appendectomy    . Lumbar laminectomy  1987    Dr Durward Fortes  . Rotator cuff repair    . Spinal fusion  1986    Dr Rolin Barry  . Myelogram  2007  . Cardiac catheterization       X3;Dr Caryl Comes, last in July, 2013  . Colonoscopy  last 06-2013     Dr Sharlett Iles  . Cystoscopy  1978    Dr Hartley Barefoot    History   Social History  . Marital Status: Married    Spouse Name: N/A  . Number of Children: 2  . Years of Education: N/A   Occupational History  . Retried    Social History Main Topics  . Smoking status: Current Every Day Smoker -- 1.00 packs/day for 30 years    Types: Cigarettes  . Smokeless  tobacco: Not on file     Comment: 1 ppd   . Alcohol Use: 0.0 oz/week     Comment: 6-7 cans of beer daily  . Drug Use: No  . Sexual Activity: Not on file   Other Topics Concern  . Not on file   Social History Narrative   Lives w/ wife    Current Outpatient Prescriptions on File Prior to Visit  Medication Sig Dispense Refill  . albuterol (PROVENTIL HFA;VENTOLIN HFA) 108 (90 BASE) MCG/ACT inhaler Inhale 2 puffs into the lungs every 4 (four) hours as needed for wheezing or shortness of breath. 1 Inhaler 0  . amLODipine (NORVASC) 5 MG tablet TAKE 1 TABLET BY MOUTH DAILY. 30 tablet 5  . azelastine (ASTELIN) 137 MCG/SPRAY nasal spray Place 1 spray into both nostrils as needed.     . calcipotriene-betamethasone (TACLONEX) ointment Apply 1 application topically daily.    Marland Kitchen esomeprazole (NEXIUM) 40 MG capsule Take 1 capsule (40 mg total) by mouth daily before breakfast.    . flecainide (TAMBOCOR) 100 MG tablet Take 1 tablet (100 mg total) by mouth 2 (two) times daily. 180 tablet 3  . fluticasone (  FLONASE) 50 MCG/ACT nasal spray Place 1 spray into both nostrils as needed for allergies or rhinitis.    . rosuvastatin (CRESTOR) 5 MG tablet Take 1 tablet (5 mg total) by mouth daily. 30 tablet 2  . sildenafil (VIAGRA) 100 MG tablet Take 0.5-1 tablets (50-100 mg total) by mouth daily as needed for erectile dysfunction. 5 tablet 3   No current facility-administered medications on file prior to visit.    Allergies  Allergen Reactions  . Simvastatin Other (See Comments)    REACTION: mental status changes  . Celecoxib Other (See Comments)    REACTION: GI UPSET AND INFLAMMATION  . Codeine     REACTION: GI UPSET AND INFLAMMATION  . Nsaids Other (See Comments)    REACTION: GI UPSET AND INFLAMMATION    Family History  Problem Relation Age of Onset  . Hypertension Mother   . Hypertension Father   . Benign prostatic hyperplasia Father     S/P TURP  . Heart attack Maternal Grandmother     MI in  48s  . Breast cancer Maternal Grandmother   . Arrhythmia Brother      X 2  . Heart attack Maternal Aunt     MI in 66s  . Stroke Neg Hx   . Diabetes Neg Hx   . Colon cancer Neg Hx   . Colon polyps Neg Hx   . Stomach cancer Neg Hx   . Rectal cancer Neg Hx   . Esophageal cancer Neg Hx   . Prostate cancer Neg Hx     BP 136/74 mmHg  Pulse 76  Temp(Src) 98.6 F (37 C) (Oral)  Ht 6' (1.829 m)  Wt 190 lb (86.183 kg)  BMI 25.76 kg/m2  SpO2 96%  Review of Systems denies depression, numbness, erectile dysfunction, weight change, decreased urinary stream, gynecomastia, muscle weakness, fever, headache, easy bruising, sob, rash, blurry vision, rhinorrhea, chest pain.  viagra helps ED sxs.       Objective:   Physical Exam VITAL SIGNS:  See vs page GENERAL: no distress Breasts: slight right sided gynecomastia.  GENITALIA: Normal male testicles, scrotum, and penis.     Lab Results  Component Value Date   TESTOSTERONE 249* 10/10/2014   Radiol: i reviewed mammogram result: gynecomastia.    Assessment & Plan:  Gynecomastia: new, uncertain etiology Hypogonadism: mild.  i favor treating the anti-estrogen rather than with clomid. 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   Patient is advised the following: Patient Instructions  Please do the biopsy as scheduled. i have sent a prescription to your pharmacy, for the breast swelling.  This may help raise the testosterone, too.  Please come back for a follow-up appointment in 1 month.

## 2014-10-22 NOTE — Patient Instructions (Addendum)
Please do the biopsy as scheduled. i have sent a prescription to your pharmacy, for the breast swelling.  This may help raise the testosterone, too.  Please come back for a follow-up appointment in 1 month.

## 2014-10-26 ENCOUNTER — Ambulatory Visit
Admission: RE | Admit: 2014-10-26 | Discharge: 2014-10-26 | Disposition: A | Discharge status: Home / Self Care | Payer: BLUE CROSS/BLUE SHIELD | Source: Ambulatory Visit | Attending: Internal Medicine | Admitting: Internal Medicine

## 2014-10-26 ENCOUNTER — Other Ambulatory Visit: Payer: Self-pay | Admitting: Internal Medicine

## 2014-10-26 DIAGNOSIS — N62 Hypertrophy of breast: Secondary | ICD-10-CM

## 2014-11-12 ENCOUNTER — Encounter: Payer: Self-pay | Admitting: Internal Medicine

## 2014-11-22 ENCOUNTER — Encounter: Payer: Self-pay | Admitting: Endocrinology

## 2014-11-22 ENCOUNTER — Ambulatory Visit (INDEPENDENT_AMBULATORY_CARE_PROVIDER_SITE_OTHER): Payer: Medicare Other | Admitting: Endocrinology

## 2014-11-22 VITALS — BP 134/88 | HR 81 | Temp 98.6°F | Ht 72.0 in | Wt 190.0 lb

## 2014-11-22 DIAGNOSIS — N62 Hypertrophy of breast: Secondary | ICD-10-CM

## 2014-11-22 LAB — TESTOSTERONE: TESTOSTERONE: 472.75 ng/dL (ref 300.00–890.00)

## 2014-11-22 NOTE — Patient Instructions (Addendum)
blood tests are requested for you today.  We'll let you know about the results. You have 3 options: 1 is to give the tamoxifen more time, 2 is to add another medication 9if the testosterone is low again), and 3 is to have the swelling surgically removed.    Minimizing alcohol and weight loss also help.   Please come back for a follow-up appointment in 3 months.

## 2014-11-22 NOTE — Progress Notes (Signed)
Subjective:    Patient ID: Sean Riley, male    DOB: May 20, 1951, 64 y.o.   MRN: [id number]  HPI Pt returns for f/u of idiopathic central hypogonadism and gynecomastia.  Since on the tamoxifen, he says right breast pain is better , but swelling is the same. Past Medical History  Diagnosis Date  . Atrial fibrillation   . HYPERLIPIDEMIA   . 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 Microscopic hematuria     negative cystoscopy  . NONSPECIFIC ABNORMAL ELECTROCARDIOGRAM   . GERD (gastroesophageal reflux disease)   . Varicose veins     Past Surgical History  Procedure Laterality Date  . Tracheotomy      age 41 for croup  . Knee arthroscopy  2012    Dr Theda Sers  . Shoulder arthroscopy  2011    Dr Theda Sers  . Coronary angioplasty      04/24/13 : he denies angioplasty  . Appendectomy    . Lumbar laminectomy  1987    Dr Durward Fortes  . Rotator cuff repair    . Spinal fusion  1986    Dr Rolin Barry  . Myelogram  2007  . Cardiac catheterization       X3;Dr Caryl Comes, last in July, 2013  . Colonoscopy  last 06-2013     Dr Sharlett Iles  . Cystoscopy  1978    Dr Hartley Barefoot    History   Social History  . Marital Status: Married    Spouse Name: N/A  . Number of Children: 2  . Years of Education: N/A   Occupational History  . Retried    Social History Main Topics  . Smoking status: Current Every Day Smoker -- 1.00 packs/day for 30 years    Types: Cigarettes  . Smokeless tobacco: Not on file     Comment: 1 ppd   . Alcohol Use: 0.0 oz/week     Comment: 6-7 cans of beer daily  . Drug Use: No  . Sexual Activity: Not on file   Other Topics Concern  . Not on file   Social History Narrative   Lives w/ wife    Current Outpatient Prescriptions on File Prior to Visit  Medication Sig Dispense Refill  . albuterol (PROVENTIL HFA;VENTOLIN HFA) 108 (90 BASE) MCG/ACT inhaler Inhale 2 puffs into the lungs every 4 (four) hours as needed for wheezing or shortness of breath. 1  Inhaler 0  . amLODipine (NORVASC) 5 MG tablet TAKE 1 TABLET BY MOUTH DAILY. 30 tablet 5  . azelastine (ASTELIN) 137 MCG/SPRAY nasal spray Place 1 spray into both nostrils as needed.     . calcipotriene-betamethasone (TACLONEX) ointment Apply 1 application topically daily.    Marland Kitchen esomeprazole (NEXIUM) 40 MG capsule Take 1 capsule (40 mg total) by mouth daily before breakfast.    . flecainide (TAMBOCOR) 100 MG tablet Take 1 tablet (100 mg total) by mouth 2 (two) times daily. 180 tablet 3  . fluticasone (FLONASE) 50 MCG/ACT nasal spray Place 1 spray into both nostrils as needed for allergies or rhinitis.    . rosuvastatin (CRESTOR) 5 MG tablet Take 1 tablet (5 mg total) by mouth daily. 30 tablet 2  . sildenafil (VIAGRA) 100 MG tablet Take 0.5-1 tablets (50-100 mg total) by mouth daily as needed for erectile dysfunction. 5 tablet 3  . tamoxifen (NOLVADEX) 10 MG tablet Take 1 tablet (10 mg total) by mouth 2 (two) times daily. 90 tablet 1   No current facility-administered medications  on file prior to visit.    Allergies  Allergen Reactions  . Simvastatin Other (See Comments)    REACTION: mental status changes  . Celecoxib Other (See Comments)    REACTION: GI UPSET AND INFLAMMATION  . Codeine     REACTION: GI UPSET AND INFLAMMATION  . Nsaids Other (See Comments)    REACTION: GI UPSET AND INFLAMMATION    Family History  Problem Relation Age of Onset  . Hypertension Mother   . Hypertension Father   . Benign prostatic hyperplasia Father     S/P TURP  . Heart attack Maternal Grandmother     MI in 82s  . Breast cancer Maternal Grandmother   . Arrhythmia Brother      X 2  . Heart attack Maternal Aunt     MI in 50s  . Stroke Neg Hx   . Diabetes Neg Hx   . Colon cancer Neg Hx   . Colon polyps Neg Hx   . Stomach cancer Neg Hx   . Rectal cancer Neg Hx   . Esophageal cancer Neg Hx   . Prostate cancer Neg Hx     BP 134/88 mmHg  Pulse 81  Temp(Src) 98.6 F (37 C) (Oral)  Ht 6' (1.829  m)  Wt 190 lb (86.183 kg)  BMI 25.76 kg/m2  SpO2 96%  Review of Systems Denies galactorrhea    Objective:   Physical Exam VITAL SIGNS:  See vs page GENERAL: no distress Breasts: right gynecomastia is the same.   Lab Results  Component Value Date   TESTOSTERONE 472.75 11/22/2014  pathol: benign    Assessment & Plan:  Hypogonadism: better with tamoxifen Gynecomastia, benign but not improved  Patient is advised the following: Patient Instructions  blood tests are requested for you today.  We'll let you know about the results. You have 3 options: 1 is to give the tamoxifen more time, 2 is to add another medication 9if the testosterone is low again), and 3 is to have the swelling surgically removed.    Minimizing alcohol and weight loss also help.   Please come back for a follow-up appointment in 3 months.

## 2014-12-06 ENCOUNTER — Other Ambulatory Visit: Payer: Self-pay

## 2014-12-18 ENCOUNTER — Other Ambulatory Visit: Payer: Self-pay | Admitting: *Deleted

## 2014-12-18 ENCOUNTER — Encounter: Payer: Self-pay | Admitting: Internal Medicine

## 2014-12-18 ENCOUNTER — Ambulatory Visit (INDEPENDENT_AMBULATORY_CARE_PROVIDER_SITE_OTHER): Payer: Medicare Other | Admitting: Internal Medicine

## 2014-12-18 VITALS — BP 140/80 | HR 75 | Ht 73.0 in | Wt 192.8 lb

## 2014-12-18 DIAGNOSIS — I480 Paroxysmal atrial fibrillation: Secondary | ICD-10-CM

## 2014-12-18 MED ORDER — FLECAINIDE ACETATE 100 MG PO TABS: 100.0000 mg | ORAL_TABLET | Freq: Two times a day (BID) | ORAL | Status: DC

## 2014-12-18 NOTE — Progress Notes (Signed)
Patient Care Team: Colon Branch, MD as PCP - General (Internal Medicine) Deboraha Sprang, MD as Consulting Physician (Cardiology) Deneise Lever, MD as Consulting Physician (Pulmonary Disease)   HPI  Sean Riley is a 64 y.o. male  Seen in follow for atrial fibrillation. He continues to take flecainide; he's been largely asymptomatic without symptomatic recurrences.  There is a concern that he may be allergic to diltiazem as he has a pruritic rash;  there was an issue with a rash. He isn't back up on diltiazem as it did not resolve off drugs.   He still has complaints of shortness of breath. Pulmonary function testing 5/15 demonstrated mild emphysema..  He has a spell a few months ago when he got off a tractor had 40 seconds of a very unusual sensation with difficulty breathing some lightheadedness no associated palpitations or chest discomfort and then it resolved.  .  There has been no syncope or presyncope.    He underwent Myoview scanning 2013 which was abnormal and underwent catheterization which was normal. He remains on flecainide   Date Dose QRS  5/12  112        5/15 100 116  5/16 100 132    Past Medical History  Diagnosis Date  . Atrial fibrillation   . HYPERLIPIDEMIA   . 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 Microscopic hematuria     negative cystoscopy  . NONSPECIFIC ABNORMAL ELECTROCARDIOGRAM   . GERD (gastroesophageal reflux disease)   . Varicose veins     Past Surgical History  Procedure Laterality Date  . Tracheotomy      age 71 for croup  . Knee arthroscopy  2012    Dr Theda Sers  . Shoulder arthroscopy  2011    Dr Theda Sers  . Coronary angioplasty      04/24/13 : he denies angioplasty  . Appendectomy    . Lumbar laminectomy  1987    Dr Durward Fortes  . Rotator cuff repair    . Spinal fusion  1986    Dr Rolin Barry  . Myelogram  2007  . Cardiac catheterization       X3;Dr Caryl Comes, last in July, 2013  . Colonoscopy  last 06-2013    Dr Sharlett Iles  . Cystoscopy  1978    Dr Hartley Barefoot    Current Outpatient Prescriptions  Medication Sig Dispense Refill  . albuterol (PROVENTIL HFA;VENTOLIN HFA) 108 (90 BASE) MCG/ACT inhaler Inhale 2 puffs into the lungs every 4 (four) hours as needed for wheezing or shortness of breath. 1 Inhaler 0  . amLODipine (NORVASC) 5 MG tablet TAKE 1 TABLET BY MOUTH DAILY. 30 tablet 5  . azelastine (ASTELIN) 137 MCG/SPRAY nasal spray Place 1 spray into both nostrils as needed.     . calcipotriene-betamethasone (TACLONEX) ointment Apply 1 application topically daily.    Marland Kitchen esomeprazole (NEXIUM) 40 MG capsule Take 1 capsule (40 mg total) by mouth daily before breakfast.    . flecainide (TAMBOCOR) 100 MG tablet Take 1 tablet (100 mg total) by mouth 2 (two) times daily. 180 tablet 3  . fluticasone (FLONASE) 50 MCG/ACT nasal spray Place 1 spray into both nostrils as needed for allergies or rhinitis.    . rosuvastatin (CRESTOR) 5 MG tablet Take 1 tablet (5 mg total) by mouth daily. 30 tablet 2  . sildenafil (VIAGRA) 100 MG tablet Take 0.5-1 tablets (50-100 mg total) by mouth daily as needed for erectile dysfunction. 5  tablet 3  . tamoxifen (NOLVADEX) 10 MG tablet Take 1 tablet (10 mg total) by mouth 2 (two) times daily. 90 tablet 1   No current facility-administered medications for this visit.    Allergies  Allergen Reactions  . Simvastatin Other (See Comments)    REACTION: mental status changes  . Celecoxib Other (See Comments)    REACTION: GI UPSET AND INFLAMMATION  . Codeine     REACTION: GI UPSET AND INFLAMMATION  . Nsaids Other (See Comments)    REACTION: GI UPSET AND INFLAMMATION    Review of Systems negative except from HPI and PMH  Physical Exam BP 140/80 mmHg  Pulse 75  Ht 6\' 1"  (1.854 m)  Wt 192 lb 12.8 oz (87.454 kg)  BMI 25.44 kg/m2 Well developed and well nourished in no acute distress HENT normal E scleral and icterus clear Neck Supple JVP flat; carotids brisk and  full Clear to ausculation  Regular rate and rhythm, no murmurs gallops or rub Soft with active bowel sounds No clubbing cyanosis  Edema Alert and oriented, grossly normal motor and sensory function Skin Warm and Dry  ECG demonstrated sinus rhythm at 78 Interval 17/13/39 Axis leftward at -40 right bundle branch block  Assessment and Plan  Atrial fibrillation  Hypertension  Lightheadedness   Patient had an unusual spell which by its brevity and its severity suggests that it might have been a rhythmic notwithstanding the lack of palpitations. In the event that he hasn't again, based on frequency, diffuse some type of monitor to try to elucidate.  It is unlikely that was ischemic. He has a negative catheterization. The brevity also speaks against that.  He has had no known intercurrent atrial fibrillation.  Blood pressure is reasonably controlled.  We will need to consider the addition of anticoagulation.

## 2014-12-21 DIAGNOSIS — L400 Psoriasis vulgaris: Secondary | ICD-10-CM | POA: Diagnosis not present

## 2015-02-05 ENCOUNTER — Telehealth: Payer: Self-pay | Admitting: Endocrinology

## 2015-02-05 MED ORDER — TAMOXIFEN CITRATE 10 MG PO TABS: 10.0000 mg | ORAL_TABLET | Freq: Two times a day (BID) | ORAL | Status: DC

## 2015-02-05 NOTE — Telephone Encounter (Signed)
Please call in Rx to patients pharmacy   Rx: Tamoxifen  Pharmacy: [facility]   Thank you

## 2015-02-05 NOTE — Telephone Encounter (Signed)
Rx sent per pt's request.  

## 2015-02-16 DIAGNOSIS — M79671 Pain in right foot: Secondary | ICD-10-CM | POA: Diagnosis not present

## 2015-02-21 ENCOUNTER — Encounter: Payer: Self-pay | Admitting: Endocrinology

## 2015-02-21 ENCOUNTER — Ambulatory Visit (INDEPENDENT_AMBULATORY_CARE_PROVIDER_SITE_OTHER): Payer: Medicare Other | Admitting: Endocrinology

## 2015-02-21 VITALS — BP 144/93 | HR 79 | Temp 98.2°F | Ht 72.0 in | Wt 193.0 lb

## 2015-02-21 DIAGNOSIS — N62 Hypertrophy of breast: Secondary | ICD-10-CM

## 2015-02-21 NOTE — Patient Instructions (Signed)
Please continue the same medication.   Please come back for a follow-up appointment in 6-9 months.

## 2015-02-21 NOTE — Progress Notes (Signed)
Subjective:    Patient ID: Sean Riley, male    DOB: 1951-04-17, 64 y.o.   MRN: [id number]  HPI Pt returns for f/u of idiopathic central hypogonadism and gynecomastia (dx'ed 2015; testosterone normalized with tamoxifen).  Also, since on the tamoxifen, breast swelling is less now.  ED sxs persist, but viagra helps.   Past Medical History  Diagnosis Date  . Atrial fibrillation   . HYPERLIPIDEMIA   . 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 Microscopic hematuria     negative cystoscopy  . NONSPECIFIC ABNORMAL ELECTROCARDIOGRAM   . GERD (gastroesophageal reflux disease)   . Varicose veins     Past Surgical History  Procedure Laterality Date  . Tracheotomy      age 38 for croup  . Knee arthroscopy  2012    Dr Theda Sers  . Shoulder arthroscopy  2011    Dr Theda Sers  . Coronary angioplasty      04/24/13 : he denies angioplasty  . Appendectomy    . Lumbar laminectomy  1987    Dr Durward Fortes  . Rotator cuff repair    . Spinal fusion  1986    Dr Rolin Barry  . Myelogram  2007  . Cardiac catheterization       X3;Dr Caryl Comes, last in July, 2013  . Colonoscopy  last 06-2013     Dr Sharlett Iles  . Cystoscopy  1978    Dr Hartley Barefoot    History   Social History  . Marital Status: Married    Spouse Name: N/A  . Number of Children: 2  . Years of Education: N/A   Occupational History  . Retried    Social History Main Topics  . Smoking status: Current Every Day Smoker -- 1.00 packs/day for 30 years    Types: Cigarettes  . Smokeless tobacco: Not on file     Comment: 1 ppd   . Alcohol Use: 0.0 oz/week     Comment: 6-7 cans of beer daily  . Drug Use: No  . Sexual Activity: Not on file   Other Topics Concern  . Not on file   Social History Narrative   Lives w/ wife    Current Outpatient Prescriptions on File Prior to Visit  Medication Sig Dispense Refill  . albuterol (PROVENTIL HFA;VENTOLIN HFA) 108 (90 BASE) MCG/ACT inhaler Inhale 2 puffs into the lungs every 4 (four)  hours as needed for wheezing or shortness of breath. 1 Inhaler 0  . amLODipine (NORVASC) 5 MG tablet TAKE 1 TABLET BY MOUTH DAILY. 30 tablet 5  . azelastine (ASTELIN) 137 MCG/SPRAY nasal spray Place 1 spray into both nostrils as needed.     . calcipotriene-betamethasone (TACLONEX) ointment Apply 1 application topically daily.    Marland Kitchen esomeprazole (NEXIUM) 40 MG capsule Take 1 capsule (40 mg total) by mouth daily before breakfast.    . flecainide (TAMBOCOR) 100 MG tablet Take 1 tablet (100 mg total) by mouth 2 (two) times daily. 180 tablet 3  . fluticasone (FLONASE) 50 MCG/ACT nasal spray Place 1 spray into both nostrils as needed for allergies or rhinitis.    . rosuvastatin (CRESTOR) 5 MG tablet Take 1 tablet (5 mg total) by mouth daily. 30 tablet 2  . sildenafil (VIAGRA) 100 MG tablet Take 0.5-1 tablets (50-100 mg total) by mouth daily as needed for erectile dysfunction. 5 tablet 3  . tamoxifen (NOLVADEX) 10 MG tablet Take 1 tablet (10 mg total) by mouth 2 (two) times daily. [place]  tablet 1   No current facility-administered medications on file prior to visit.    Allergies  Allergen Reactions  . Simvastatin Other (See Comments)    REACTION: mental status changes  . Celecoxib Other (See Comments)    REACTION: GI UPSET AND INFLAMMATION  . Codeine     REACTION: GI UPSET AND INFLAMMATION  . Nsaids Other (See Comments)    REACTION: GI UPSET AND INFLAMMATION    Family History  Problem Relation Age of Onset  . Hypertension Mother   . Hypertension Father   . Benign prostatic hyperplasia Father     S/P TURP  . Heart attack Maternal Grandmother     MI in 49s  . Breast cancer Maternal Grandmother   . Arrhythmia Brother      X 2  . Heart attack Maternal Aunt     MI in 71s  . Stroke Neg Hx   . Diabetes Neg Hx   . Colon cancer Neg Hx   . Colon polyps Neg Hx   . Stomach cancer Neg Hx   . Rectal cancer Neg Hx   . Esophageal cancer Neg Hx   . Prostate cancer Neg Hx     BP 144/93 mmHg   Pulse 79  Temp(Src) 98.2 F (36.8 C) (Oral)  Ht 6' (1.829 m)  Wt 193 lb (87.544 kg)  BMI 26.17 kg/m2  SpO2 97%   Review of Systems Denies decreased urinary stream.      Objective:   Physical Exam VITAL SIGNS:  See vs page GENERAL: no distress. Breasts: gynecomastia is now minimal.      Assessment & Plan:  Gynecomastia, much better with tamoxifen.  Patient is advised the following: Patient Instructions  Please continue the same medication.   Please come back for a follow-up appointment in 6-9 months.

## 2015-02-26 DIAGNOSIS — M79671 Pain in right foot: Secondary | ICD-10-CM | POA: Diagnosis not present

## 2015-02-26 DIAGNOSIS — M76821 Posterior tibial tendinitis, right leg: Secondary | ICD-10-CM | POA: Diagnosis not present

## 2015-03-11 ENCOUNTER — Other Ambulatory Visit: Payer: Self-pay

## 2015-03-11 ENCOUNTER — Telehealth: Payer: Self-pay | Admitting: Endocrinology

## 2015-03-11 ENCOUNTER — Ambulatory Visit (INDEPENDENT_AMBULATORY_CARE_PROVIDER_SITE_OTHER): Payer: Medicare Other | Admitting: Internal Medicine

## 2015-03-11 ENCOUNTER — Encounter: Payer: Self-pay | Admitting: Internal Medicine

## 2015-03-11 VITALS — BP 128/78 | HR 84 | Temp 98.0°F | Ht 72.0 in | Wt 193.5 lb

## 2015-03-11 DIAGNOSIS — I10 Essential (primary) hypertension: Secondary | ICD-10-CM | POA: Diagnosis not present

## 2015-03-11 DIAGNOSIS — F1010 Alcohol abuse, uncomplicated: Secondary | ICD-10-CM

## 2015-03-11 DIAGNOSIS — Z139 Encounter for screening, unspecified: Secondary | ICD-10-CM

## 2015-03-11 DIAGNOSIS — F17200 Nicotine dependence, unspecified, uncomplicated: Secondary | ICD-10-CM

## 2015-03-11 DIAGNOSIS — N62 Hypertrophy of breast: Secondary | ICD-10-CM | POA: Diagnosis not present

## 2015-03-11 DIAGNOSIS — Z720 Tobacco use: Secondary | ICD-10-CM

## 2015-03-11 MED ORDER — TAMOXIFEN CITRATE 10 MG PO TABS: 10.0000 mg | ORAL_TABLET | Freq: Two times a day (BID) | ORAL | Status: DC

## 2015-03-11 NOTE — Telephone Encounter (Signed)
Patient need a refill of tamoxifen (NOLVADEX) 10 MG tablet, 3 months supply  [place] [place] [phone] (Phone) [phone] (Fax)

## 2015-03-11 NOTE — Assessment & Plan Note (Signed)
Continue smoking a pack a day, risk of cancer discussed. He did agree to proceed with a lung cancer screening

## 2015-03-11 NOTE — Progress Notes (Signed)
Pre visit review using our clinic review tool, if applicable. No additional management support is needed unless otherwise documented below in the visit note. 

## 2015-03-11 NOTE — Assessment & Plan Note (Signed)
Well-controlled on amlodipine, no change. Last BMP is satisfactory

## 2015-03-11 NOTE — Progress Notes (Signed)
Subjective:    Patient ID: Sean Riley, male    DOB: [birth year], 64 y.o.   MRN: [id number]  DOS:  03/11/2015 Type of visit - description : rov Interval history: In general feeling well. Notes from cardiology and endocrinology reviewed. He continue to smoke and drink as before.   Review of Systems No chest pain or difficulty breathing. No palpitations No nausea, vomiting, diarrhea. No anxiety- depression  Past Medical History  Diagnosis Date  . Atrial fibrillation   . HYPERLIPIDEMIA   . 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 Microscopic hematuria     negative cystoscopy  . NONSPECIFIC ABNORMAL ELECTROCARDIOGRAM   . GERD (gastroesophageal reflux disease)   . Varicose veins     Past Surgical History  Procedure Laterality Date  . Tracheotomy      age 63 for croup  . Knee arthroscopy  2012    Dr Theda Sers  . Shoulder arthroscopy  2011    Dr Theda Sers  . Coronary angioplasty      04/24/13 : he denies angioplasty  . Appendectomy    . Lumbar laminectomy  1987    Dr Durward Fortes  . Rotator cuff repair    . Spinal fusion  1986    Dr Rolin Barry  . Myelogram  2007  . Cardiac catheterization       X3;Dr Caryl Comes, last in July, 2013  . Colonoscopy  last 06-2013     Dr Sharlett Iles  . Cystoscopy  1978    Dr Hartley Barefoot    History   Social History  . Marital Status: Married    Spouse Name: N/A  . Number of Children: 2  . Years of Education: N/A   Occupational History  . Retried    Social History Main Topics  . Smoking status: Current Every Day Smoker -- 1.00 packs/day for 30 years    Types: Cigarettes  . Smokeless tobacco: Not on file     Comment: 1 ppd   . Alcohol Use: 0.0 oz/week     Comment: 6-7 cans of beer daily  . Drug Use: No  . Sexual Activity: Not on file   Other Topics Concern  . Not on file   Social History Narrative   Lives w/ wife        Medication List       This list is accurate as of: 03/11/15  1:04 PM.  Always use your most recent  med list.               albuterol 108 (90 BASE) MCG/ACT inhaler  Commonly known as:  PROVENTIL HFA;VENTOLIN HFA  Inhale 2 puffs into the lungs every 4 (four) hours as needed for wheezing or shortness of breath.     amLODipine 5 MG tablet  Commonly known as:  NORVASC  TAKE 1 TABLET BY MOUTH DAILY.     azelastine 0.1 % nasal spray  Commonly known as:  ASTELIN  Place 1 spray into both nostrils as needed.     calcipotriene-betamethasone ointment  Commonly known as:  TACLONEX  Apply 1 application topically daily.     flecainide 100 MG tablet  Commonly known as:  TAMBOCOR  Take 1 tablet (100 mg total) by mouth 2 (two) times daily.     fluticasone 50 MCG/ACT nasal spray  Commonly known as:  FLONASE  Place 1 spray into both nostrils as needed for allergies or rhinitis.     NEXIUM 40 MG capsule  Generic  drug:  esomeprazole  Take 1 capsule (40 mg total) by mouth daily before breakfast.     rosuvastatin 5 MG tablet  Commonly known as:  CRESTOR  Take 1 tablet (5 mg total) by mouth daily.     sildenafil 100 MG tablet  Commonly known as:  VIAGRA  Take 0.5-1 tablets (50-100 mg total) by mouth daily as needed for erectile dysfunction.     tamoxifen 10 MG tablet  Commonly known as:  NOLVADEX  Take 1 tablet (10 mg total) by mouth 2 (two) times daily.           Objective:   Physical Exam BP 128/78 mmHg  Pulse 84  Temp(Src) 98 F (36.7 C) (Oral)  Ht 6' (1.829 m)  Wt 193 lb 8 oz (87.771 kg)  BMI 26.24 kg/m2  SpO2 94% General:   Well developed, well nourished . NAD.  HEENT:  Normocephalic . Face symmetric, atraumatic Lungs:  CTA B, slightly increased expiratory time Normal respiratory effort, no intercostal retractions, no accessory muscle use. Heart: RRR,  no murmur.  No pretibial edema bilaterally  Skin: Not pale. Not jaundice Neurologic:  alert & oriented X3.  Speech normal, gait appropriate for age and unassisted Psych--  Cognition and judgment appear intact.    Cooperative with normal attention span and concentration.  Behavior appropriate. No anxious or depressed appearing.      Assessment & Plan:

## 2015-03-11 NOTE — Patient Instructions (Signed)
Please consider a flu shot this Fall

## 2015-03-11 NOTE — Assessment & Plan Note (Signed)
Had a mammogram and ultrasound on 10-2014 and eventually a breast biopsy which was negative for malignancy. Saw endocrinology, currently taking tamoxifen with good results.

## 2015-03-11 NOTE — Assessment & Plan Note (Signed)
Continue drinking approximately 6 or 7 beers at night, risk of cirrhosis and complications discussed. Does not seem to be ready to quit or even slow down

## 2015-03-15 ENCOUNTER — Ambulatory Visit (HOSPITAL_COMMUNITY)
Admission: RE | Admit: 2015-03-15 | Discharge: 2015-03-15 | Disposition: A | Discharge status: Home / Self Care | Payer: Medicare Other | Source: Ambulatory Visit | Attending: Internal Medicine | Admitting: Internal Medicine

## 2015-03-15 DIAGNOSIS — F17210 Nicotine dependence, cigarettes, uncomplicated: Secondary | ICD-10-CM | POA: Insufficient documentation

## 2015-03-15 DIAGNOSIS — R911 Solitary pulmonary nodule: Secondary | ICD-10-CM | POA: Insufficient documentation

## 2015-03-15 DIAGNOSIS — J8410 Pulmonary fibrosis, unspecified: Secondary | ICD-10-CM | POA: Insufficient documentation

## 2015-03-15 DIAGNOSIS — J432 Centrilobular emphysema: Secondary | ICD-10-CM | POA: Insufficient documentation

## 2015-03-15 DIAGNOSIS — I2510 Atherosclerotic heart disease of native coronary artery without angina pectoris: Secondary | ICD-10-CM | POA: Insufficient documentation

## 2015-03-15 DIAGNOSIS — Z122 Encounter for screening for malignant neoplasm of respiratory organs: Secondary | ICD-10-CM | POA: Diagnosis not present

## 2015-03-15 DIAGNOSIS — Z87891 Personal history of nicotine dependence: Secondary | ICD-10-CM | POA: Diagnosis not present

## 2015-03-15 DIAGNOSIS — I700 Atherosclerosis of aorta: Secondary | ICD-10-CM | POA: Diagnosis not present

## 2015-03-15 DIAGNOSIS — F17200 Nicotine dependence, unspecified, uncomplicated: Secondary | ICD-10-CM

## 2015-03-21 DIAGNOSIS — M76821 Posterior tibial tendinitis, right leg: Secondary | ICD-10-CM | POA: Diagnosis not present

## 2015-04-17 ENCOUNTER — Telehealth: Payer: Self-pay | Admitting: Internal Medicine

## 2015-04-17 MED ORDER — ROSUVASTATIN CALCIUM 5 MG PO TABS: 5.0000 mg | ORAL_TABLET | Freq: Every day | ORAL | Status: DC

## 2015-04-17 NOTE — Telephone Encounter (Signed)
Pt's spouse called in requesting a refill on pt's medication.  CRESTOR she says that she would like to have a 3 mth supply if possible.    CB#: [phone]

## 2015-04-17 NOTE — Telephone Encounter (Signed)
Crestor, #90 tabs sent to [facility].

## 2015-04-18 DIAGNOSIS — M76821 Posterior tibial tendinitis, right leg: Secondary | ICD-10-CM | POA: Diagnosis not present

## 2015-04-26 DIAGNOSIS — M25572 Pain in left ankle and joints of left foot: Secondary | ICD-10-CM | POA: Diagnosis not present

## 2015-05-02 DIAGNOSIS — J209 Acute bronchitis, unspecified: Secondary | ICD-10-CM | POA: Diagnosis not present

## 2015-05-02 DIAGNOSIS — J0100 Acute maxillary sinusitis, unspecified: Secondary | ICD-10-CM | POA: Diagnosis not present

## 2015-05-14 DIAGNOSIS — M76821 Posterior tibial tendinitis, right leg: Secondary | ICD-10-CM | POA: Diagnosis not present

## 2015-06-05 ENCOUNTER — Ambulatory Visit (INDEPENDENT_AMBULATORY_CARE_PROVIDER_SITE_OTHER): Payer: Medicare Other | Admitting: Behavioral Health

## 2015-06-05 DIAGNOSIS — Z23 Encounter for immunization: Secondary | ICD-10-CM | POA: Diagnosis not present

## 2015-06-05 NOTE — Progress Notes (Signed)
Pre visit review using our clinic review tool, if applicable. No additional management support is needed unless otherwise documented below in the visit note. 

## 2015-06-14 DIAGNOSIS — M25512 Pain in left shoulder: Secondary | ICD-10-CM | POA: Diagnosis not present

## 2015-06-14 DIAGNOSIS — M25511 Pain in right shoulder: Secondary | ICD-10-CM | POA: Diagnosis not present

## 2015-06-25 DIAGNOSIS — M25511 Pain in right shoulder: Secondary | ICD-10-CM | POA: Diagnosis not present

## 2015-06-25 DIAGNOSIS — G8929 Other chronic pain: Secondary | ICD-10-CM | POA: Diagnosis not present

## 2015-07-08 ENCOUNTER — Telehealth: Payer: Self-pay | Admitting: Internal Medicine

## 2015-07-08 DIAGNOSIS — S46011D Strain of muscle(s) and tendon(s) of the rotator cuff of right shoulder, subsequent encounter: Secondary | ICD-10-CM | POA: Diagnosis not present

## 2015-07-08 NOTE — Telephone Encounter (Signed)
Pt dropped off paperwork for dr. Larose Kells to sign to release pt to have surgery, paperwork was placed in your tray at the front desk. Pt requesting to be faxed directly to [facility].

## 2015-07-09 NOTE — Telephone Encounter (Signed)
Pt needs surgical clearance appt with Dr. Larose Kells. He has a f/u on 07/24/15, but clearance appt needs to be 30 minutes ( may can extend to 30 minutes on 12/14). Please call and schedule with pt. Thanks, JG//CMA

## 2015-07-09 NOTE — Telephone Encounter (Signed)
Patient scheduled for 07/24/2015 surgical clearance appointment.  (30 minute slot)

## 2015-07-10 NOTE — Telephone Encounter (Signed)
Forms received. Awaiting appt.

## 2015-07-10 NOTE — Telephone Encounter (Signed)
Form forwarded to [facility] for appt. JG//CMA

## 2015-07-19 ENCOUNTER — Encounter: Payer: Self-pay | Admitting: Internal Medicine

## 2015-07-22 ENCOUNTER — Encounter: Payer: Self-pay | Admitting: *Deleted

## 2015-07-24 ENCOUNTER — Ambulatory Visit (INDEPENDENT_AMBULATORY_CARE_PROVIDER_SITE_OTHER): Payer: Medicare Other | Admitting: Cardiology

## 2015-07-24 ENCOUNTER — Encounter: Payer: Self-pay | Admitting: Internal Medicine

## 2015-07-24 ENCOUNTER — Ambulatory Visit (INDEPENDENT_AMBULATORY_CARE_PROVIDER_SITE_OTHER): Payer: Medicare Other | Admitting: Internal Medicine

## 2015-07-24 ENCOUNTER — Encounter: Payer: Self-pay | Admitting: Cardiology

## 2015-07-24 VITALS — BP 142/80 | HR 83 | Ht 74.0 in | Wt 198.8 lb

## 2015-07-24 VITALS — BP 128/82 | HR 80 | Temp 98.0°F | Ht 72.0 in | Wt 199.1 lb

## 2015-07-24 DIAGNOSIS — F1010 Alcohol abuse, uncomplicated: Secondary | ICD-10-CM

## 2015-07-24 DIAGNOSIS — Z01818 Encounter for other preprocedural examination: Secondary | ICD-10-CM

## 2015-07-24 DIAGNOSIS — E871 Hypo-osmolality and hyponatremia: Secondary | ICD-10-CM | POA: Diagnosis not present

## 2015-07-24 DIAGNOSIS — I480 Paroxysmal atrial fibrillation: Secondary | ICD-10-CM

## 2015-07-24 DIAGNOSIS — I10 Essential (primary) hypertension: Secondary | ICD-10-CM | POA: Diagnosis not present

## 2015-07-24 DIAGNOSIS — E785 Hyperlipidemia, unspecified: Secondary | ICD-10-CM

## 2015-07-24 DIAGNOSIS — R9431 Abnormal electrocardiogram [ECG] [EKG]: Secondary | ICD-10-CM

## 2015-07-24 DIAGNOSIS — Z09 Encounter for follow-up examination after completed treatment for conditions other than malignant neoplasm: Secondary | ICD-10-CM

## 2015-07-24 DIAGNOSIS — I4510 Unspecified right bundle-branch block: Secondary | ICD-10-CM

## 2015-07-24 DIAGNOSIS — R079 Chest pain, unspecified: Secondary | ICD-10-CM

## 2015-07-24 DIAGNOSIS — Z1159 Encounter for screening for other viral diseases: Secondary | ICD-10-CM

## 2015-07-24 MED ORDER — AMLODIPINE BESYLATE 5 MG PO TABS: 5.0000 mg | ORAL_TABLET | Freq: Every day | ORAL | Status: DC

## 2015-07-24 MED ORDER — ROSUVASTATIN CALCIUM 5 MG PO TABS: 5.0000 mg | ORAL_TABLET | Freq: Every day | ORAL | Status: DC

## 2015-07-24 NOTE — Progress Notes (Signed)
Pre visit review using our clinic review tool, if applicable. No additional management support is needed unless otherwise documented below in the visit note. 

## 2015-07-24 NOTE — Assessment & Plan Note (Signed)
Surgical clearance: pt  with atrial fibrillation, heavy smoker and drinker, mild COPD presents for surgical clearance.He is in a lot of pain and need surgery. Patient is clear from my standpoint, understanding he will do  best if he quit tobacco -at least temporarily-before the surgery, he also needs to be upfront with his surgeons about drinking heavily. He has a history of snoring but the Epworth sleepiness scale is negative today (scored 3 ). Will check labs. He will see cardiology today for cardiac clearance. Atrial fibrillation: We'll check a CMP , tsh and CBC. He seems to be on sinus rhythm today EtOH: Checkup [id number], folic acid   primary care: Check a hep C RTC 3 months for a CPX

## 2015-07-24 NOTE — Patient Instructions (Addendum)
Medication Instructions:  Your physician recommends that you continue on your current medications as directed. Please refer to the Current Medication list given to you today.   Labwork: NONE ORDERED  Testing/Procedures: Your physician has requested that you have a [place] 1 WEEK  For further information please visit [email or web address]. Please follow instruction sheet, as given.    Follow-Up: Your physician recommends that you schedule a follow-up appointment :  To be determined after testing  Any Other Special Instructions Will Be Listed Below (If Applicable).  Pharmacologic Stress Electrocardiogram A pharmacologic stress electrocardiogram is a heart (cardiac) test that uses nuclear imaging to evaluate the blood supply to your heart. This test may also be called a pharmacologic stress electrocardiography. Pharmacologic means that a medicine is used to increase your heart rate and blood pressure.  This stress test is done to find areas of poor blood flow to the heart by determining the extent of coronary artery disease (CAD). Some people exercise on a treadmill, which naturally increases the blood flow to the heart. For those people unable to exercise on a treadmill, a medicine is used. This medicine stimulates your heart and will cause your heart to beat harder and more quickly, as if you were exercising.  Pharmacologic stress tests can help determine:  The adequacy of blood flow to your heart during increased levels of activity in order to clear you for discharge home.  The extent of coronary artery blockage caused by CAD.  Your prognosis if you have suffered a heart attack.  The effectiveness of cardiac procedures done, such as an angioplasty, which can increase the circulation in your coronary arteries.  Causes of chest pain or pressure. LET [facility] CARE PROVIDER KNOW ABOUT:  Any allergies you have.  All medicines you are taking, including vitamins, herbs,  eye drops, creams, and over-the-counter medicines.  Previous problems you or members of your family have had with the use of anesthetics.  Any blood disorders you have.  Previous surgeries you have had.  Medical conditions you have.  Possibility of pregnancy, if this applies.  If you are currently breastfeeding. RISKS AND COMPLICATIONS Generally, this is a safe procedure. However, as with any procedure, complications can occur. Possible complications include:  You develop pain or pressure in the following areas:  Chest.  Jaw or neck.  Between your shoulder blades.  Radiating down your left arm.  Headache.  Dizziness or light-headedness.  Shortness of breath.  Increased or irregular heartbeat.  Low blood pressure.  Nausea or vomiting.  Flushing.  Redness going up the arm and slight pain during injection of medicine.  Heart attack (rare). BEFORE THE PROCEDURE   Avoid all forms of caffeine for 24 hours before your test or as directed by your health care provider. This includes coffee, tea (even decaffeinated tea), caffeinated sodas, chocolate, cocoa, and certain pain medicines.  Follow your health care provider's instructions regarding eating and drinking before the test.  Take your medicines as directed at regular times with water unless instructed otherwise. Exceptions may include:  If you have diabetes, ask how you are to take your insulin or pills. It is common to adjust insulin dosing the morning of the test.  If you are taking beta-blocker medicines, it is important to talk to your health care provider about these medicines well before the date of your test. Taking beta-blocker medicines may interfere with the test. In some cases, these medicines need to be changed or stopped 24 hours or more  before the test.  If you wear a nitroglycerin patch, it may need to be removed prior to the test. Ask your health care provider if the patch should be removed before the  test.  If you use an inhaler for any breathing condition, bring it with you to the test.  If you are an outpatient, bring a snack so you can eat right after the stress phase of the test.  Do not smoke for 4 hours prior to the test or as directed by your health care provider.  Do not apply lotions, powders, creams, or oils on your chest prior to the test.  Wear comfortable shoes and clothing. Let your health care provider know if you were unable to complete or follow the preparations for your test. PROCEDURE   Multiple patches (electrodes) will be put on your chest. If needed, small areas of your chest may be shaved to get better contact with the electrodes. Once the electrodes are attached to your body, multiple wires will be attached to the electrodes, and your heart rate will be monitored.  An IV access will be started. A nuclear trace (isotope) is given. The isotope may be given intravenously, or it may be swallowed. Nuclear refers to several types of radioactive isotopes, and the nuclear isotope lights up the arteries so that the nuclear images are clear. The isotope is absorbed by your body. This results in low radiation exposure.  A resting nuclear image is taken to show how your heart functions at rest.  A medicine is given through the IV access.  A second scan is done about 1 hour after the medicine injection and determines how your heart functions under stress.  During this stress phase, you will be connected to an electrocardiogram machine. Your blood pressure and oxygen levels will be monitored. AFTER THE PROCEDURE   Your heart rate and blood pressure will be monitored after the test.  You may return to your normal schedule, including diet,activities, and medicines, unless your health care provider tells you otherwise.   This information is not intended to replace advice given to you by your health care provider. Make sure you discuss any questions you have with your health  care provider.   Document Released: 12/13/2008 Document Revised: 08/01/2013 Document Reviewed: 04/03/2013 Elsevier Interactive Patient Education 2016 [place] you need a refill on your cardiac medications before your next appointment, please call your pharmacy.

## 2015-07-24 NOTE — Progress Notes (Signed)
Subjective:    Patient ID: Sean Riley, male    DOB: [birth year], 64 y.o.   MRN: [id number]  DOS:  07/24/2015 Type of visit - description : Surgical clearance Interval history: Patient needs a right shoulder rotator cuff repair, reports severe pain. Medication list reviewed: Good compliance no apparent side effects. Ambulatory BPs in the 130s. Still drinking approximately 5 beers daily and smokes a pack a day.   Review of Systems Denies chest pain, no difficulty breathing with activities of daily living including going up stairs. No lower extremity edema or palpitations. Has a daily cough, + white sputum, occasional wheezing. No hemoptysis. + Snoring but no fatigue.   Past Medical History  Diagnosis Date  . Atrial fibrillation ([place])   . HYPERLIPIDEMIA   . 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 Microscopic hematuria     negative cystoscopy  . NONSPECIFIC ABNORMAL ELECTROCARDIOGRAM   . GERD (gastroesophageal reflux disease)   . Varicose veins     Past Surgical History  Procedure Laterality Date  . Tracheostomy      age 1 for croup  . Knee arthroscopy  2012    Dr Theda Sers  . Shoulder arthroscopy  2011    Dr Theda Sers  . Coronary angioplasty      04/24/13 : he denies angioplasty  . Appendectomy    . Lumbar laminectomy  1987    Dr Durward Fortes  . Rotator cuff repair    . Spinal fusion  1986    Dr Rolin Barry  . Myelogram  2007  . Cardiac catheterization       X3;Dr Caryl Comes, last in July, 2013  . Colonoscopy  last 06-2013     Dr Sharlett Iles  . Cystoscopy  1978    Dr Hartley Barefoot    Social History   Social History  . Marital Status: Married    Spouse Name: N/A  . Number of Children: 2  . Years of Education: N/A   Occupational History  . Retried    Social History Main Topics  . Smoking status: Current Every Day Smoker -- 1.00 packs/day for 30 years    Types: Cigarettes  . Smokeless tobacco: Not on file     Comment: 1 ppd   . Alcohol Use: 0.0 oz/week     Comment: 6-7 cans of beer daily  . Drug Use: No  . Sexual Activity: Not on file   Other Topics Concern  . Not on file   Social History Narrative   Lives w/ wife        Medication List       This list is accurate as of: 07/24/15  5:27 PM.  Always use your most recent med list.               albuterol 108 (90 BASE) MCG/ACT inhaler  Commonly known as:  PROVENTIL HFA;VENTOLIN HFA  Inhale 2 puffs into the lungs every 4 (four) hours as needed for wheezing or shortness of breath.     amLODipine 5 MG tablet  Commonly known as:  NORVASC  Take 1 tablet (5 mg total) by mouth daily.     azelastine 0.1 % nasal spray  Commonly known as:  ASTELIN  Place 1 spray into both nostrils as needed. Reported on 07/24/2015     calcipotriene-betamethasone ointment  Commonly known as:  TACLONEX  Apply 1 application topically daily.     flecainide 100 MG tablet  Commonly known as:  [employer]  Take 1 tablet (100 mg total) by mouth 2 (two) times daily.     fluticasone 50 MCG/ACT nasal spray  Commonly known as:  FLONASE  Place 1 spray into both nostrils as needed for allergies or rhinitis. Reported on 07/24/2015     NEXIUM 40 MG capsule  Generic drug:  esomeprazole  Take 1 capsule (40 mg total) by mouth daily before breakfast.     rosuvastatin 5 MG tablet  Commonly known as:  CRESTOR  Take 1 tablet (5 mg total) by mouth daily.     sildenafil 100 MG tablet  Commonly known as:  VIAGRA  Take 0.5-1 tablets (50-100 mg total) by mouth daily as needed for erectile dysfunction.           Objective:   Physical Exam BP 128/82 mmHg  Pulse 80  Temp(Src) 98 F (36.7 C) (Oral)  Ht 6' (1.829 m)  Wt 199 lb 2 oz (90.323 kg)  BMI 27.00 kg/m2  SpO2 96% General:   Well developed, well nourished . NAD.  HEENT:  Normocephalic . Face symmetric, atraumatic. Neck: No JVD at 45 Lungs:  CTA B mild increased expiratory time but no wheezing, rhonchi. Normal respiratory effort, no intercostal  retractions, no accessory muscle use. Heart: RRR,  no murmur.  no pretibial edema bilaterally  Abdomen:  Not distended, soft, non-tender. No rebound or rigidity.  MSK: Unable to elevate his right arm due to pain Skin: Not pale. Not jaundice Neurologic:  alert & oriented X3.  Speech normal, gait appropriate for age and unassisted Psych--  Cognition and judgment appear intact.  Cooperative with normal attention span and concentration.  Behavior appropriate. No anxious or depressed appearing.    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 Tried a cpap. Epworth (-) 07-2015 CV: --Atrial fibrillation Dr Caryl Comes --lexiscan 2013 --Cardiac catheterization July 2013: Normal LV FX,, mild nonobstructive CAD Dr. Angelena Form GU: BPH, microscopic hematuria (negative cystoscopy 1978) Gynecomastia, Dr. Loanne Drilling, Rx'd tamoxifen: improvement H/o  Tracheostomy , age 39, croup   PLAN: Surgical clearance: pt  with atrial fibrillation, heavy smoker and drinker, mild COPD presents for surgical clearance.He is in a lot of pain and need surgery. Patient is clear from my standpoint, understanding he will do  best if he quit tobacco -at least temporarily-before the surgery, he also needs to be upfront with his surgeons about drinking heavily. He has a history of snoring but the Epworth sleepiness scale is negative today (scored 3 ). Will check labs. He will see cardiology today for cardiac clearance. Atrial fibrillation: We'll check a CMP , tsh and CBC. He seems to be on sinus rhythm today EtOH: Checkup [id number], folic acid   primary care: Check a hep C RTC 3 months for a CPX

## 2015-07-24 NOTE — Progress Notes (Addendum)
Cardiology Office Note   Date:  07/24/2015   ID:  Sean Riley, DOB 1950/10/26, MRN [id number]  PCP:  Kathlene November, MD  Cardiologist:  Dr. Caryl Comes    Chief Complaint  Patient presents with  . Pre-op Exam    no chest pain      History of Present Illness: Sean Riley is a 64 y.o. male who presents for pre-op risk stratification for rt shoulder rotator cuff surgery.    He has a hx of atrial fibrillation. He continues to take flecainide; he's been largely asymptomatic without symptomatic recurrences. No a fib is several years. On no anticoagulation.  Dr. Caryl Comes mentions this in last note - he has been CHA2DS2VASC 1 but with recent birthday he is a 2.    There is a concern that he may be allergic to diltiazem as he has a pruritic rash; there was an issue with a rash. He isn't back  on diltiazem.  He is on amlodipine.   In the past he had complaints of shortness of breath. Pulmonary function testing 5/15 demonstrated mild emphysema.. Denies SOB today.   He underwent Myoview scanning 2013 which was abnormal and underwent catheterization which was normal. He remains on flecainide  Cardiac cath 2004 : 1. LV 112/8/16. EF 65% without regional wall motion abnormalities. 2. No aortic stenosis or mitral regurgitation. 3. Left main: Angiographically normal. 4. LAD: Large vessel giving rise to a single large diagonal branch. 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 5. Circumflex: Moderate sized vessel giving rise to two obtuse marginals.  It is angiographically normal. 6. RCA: Large, dominant vessel with minor luminal irregularities.  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  But no strenuous activity.  He does continue to smoke.  We discussed importance of stopping but pt has no desire.    Past Medical History  Diagnosis Date  . Atrial fibrillation ([place])   . HYPERLIPIDEMIA    . 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 Microscopic hematuria     negative cystoscopy  . NONSPECIFIC ABNORMAL ELECTROCARDIOGRAM   . GERD (gastroesophageal reflux disease)   . Varicose veins     Past Surgical History  Procedure Laterality Date  . Tracheostomy      age 74 for croup  . Knee arthroscopy  2012    Dr Theda Sers  . Shoulder arthroscopy  2011    Dr Theda Sers  . Coronary angioplasty      04/24/13 : he denies angioplasty  . Appendectomy    . Lumbar laminectomy  1987    Dr Durward Fortes  . Rotator cuff repair    . Spinal fusion  1986    Dr Rolin Barry  . Myelogram  2007  . Cardiac catheterization       X3;Dr Caryl Comes, last in July, 2013  . Colonoscopy  last 06-2013     Dr Sharlett Iles  . Cystoscopy  1978    Dr Hartley Barefoot     Current Outpatient Prescriptions  Medication Sig Dispense Refill  . albuterol (PROVENTIL HFA;VENTOLIN HFA) 108 (90 BASE) MCG/ACT inhaler Inhale 2 puffs into the lungs every 4 (four) hours as needed for wheezing or shortness of breath. 1 Inhaler 0  . amLODipine (NORVASC) 5 MG tablet Take 1 tablet (5 mg total) by mouth daily. 90 tablet 2  . azelastine (ASTELIN) 137 MCG/SPRAY nasal  spray Place 1 spray into both nostrils as needed. Reported on 07/24/2015    . calcipotriene-betamethasone (TACLONEX) ointment Apply 1 application topically daily.    Marland Kitchen esomeprazole (NEXIUM) 40 MG capsule Take 1 capsule (40 mg total) by mouth daily before breakfast.    . flecainide (TAMBOCOR) 100 MG tablet Take 1 tablet (100 mg total) by mouth 2 (two) times daily. 180 tablet 3  . fluticasone (FLONASE) 50 MCG/ACT nasal spray Place 1 spray into both nostrils as needed for allergies or rhinitis. Reported on 07/24/2015    . rosuvastatin (CRESTOR) 5 MG tablet Take 1 tablet (5 mg total) by mouth daily. 90 tablet 2  . sildenafil (VIAGRA) 100 MG tablet Take 0.5-1 tablets (50-100 mg total) by mouth daily as needed for erectile dysfunction. 5 tablet 3   No current facility-administered  medications for this visit.    Allergies:   Simvastatin; Celecoxib; Codeine; and Nsaids    Social History:  The patient  reports that he has been smoking Cigarettes.  He has a 30 pack-year smoking history. He does not have any smokeless tobacco history on file. He reports that he drinks alcohol. He reports that he does not use illicit drugs.   Family History:  The patient's family history includes Arrhythmia in his brother; Benign prostatic hyperplasia in his father; Breast cancer in his maternal grandmother; Heart attack in his maternal aunt and maternal grandmother; Hypertension in his father and mother. There is no history of Stroke, Diabetes, Colon cancer, Colon polyps, Stomach cancer, Rectal cancer, Esophageal cancer, or Prostate cancer.    ROS:  General:no colds or fevers, no weight changes Skin:no rashes or ulcers HEENT:no blurred vision, no congestion CV:see HPI PUL:see HPI GI:no diarrhea constipation or melena, no indigestion GU:no hematuria, no dysuria MS:no joint pain, no claudication Neuro:no syncope, no lightheadedness Endo:no diabetes, no thyroid disease  Wt Readings from Last 3 Encounters:  07/24/15 198 lb 12.8 oz (90.175 kg)  07/24/15 199 lb 2 oz (90.323 kg)  03/11/15 193 lb 8 oz (87.771 kg)     PHYSICAL EXAM: VS:  BP 142/80 mmHg  Pulse 83  Ht 6\' 2"  (1.88 m)  Wt 198 lb 12.8 oz (90.175 kg)  BMI 25.51 kg/m2 , BMI Body mass index is 25.51 kg/(m^2). General:Pleasant affect, NAD Skin:Warm and dry, brisk capillary refill HEENT:normocephalic, sclera clear, mucus membranes moist Neck:supple, no JVD, no bruits  Heart:S1S2 RRR without murmur, gallup, rub or click Lungs:clear without rales, rhonchi, or wheezes [id number], non tender, + BS, do not palpate liver spleen or masses Ext:no lower ext edema, 2+ pedal pulses, 2+ radial pulses Neuro:alert and oriented, MAE, follows commands, + facial symmetry    EKG:  EKG is ordered today. The ekg ordered today demonstrates SR  with LAD and RBBB.   Recent Labs: 10/22/2014: ALT 13    Lipid Panel    Component Value Date/Time   CHOL 154 10/22/2014 0957   TRIG 90.0 10/22/2014 0957   HDL 51.40 10/22/2014 0957   CHOLHDL 3 10/22/2014 0957   VLDL 18.0 10/22/2014 0957   LDLCALC 85 10/22/2014 0957   LDLDIRECT 137.9 04/24/2013 1031       Other studies Reviewed: Additional studies/ records that were reviewed today include: previous notes and cath..   ASSESSMENT AND PLAN:  1.  Pre-op exam.  Discussed with Dr. Caryl Comes, pt cannot walk on a treadmill due to knee pain and also with abnormal EKG will plan for lexiscan myoview to risk stratify him.  If negative nux he will  be cleared for surgery.  We will notify Dr. Gwinda Passe of results.   2. PAF no recurrence that pt is aware.  Continue flecainide. Follow up with Dr. Caryl Comes.  They will discuss anticoagulation at that time.      Current medicines are reviewed with the patient today.  The patient Has no concerns regarding medicines.  The following changes have been made:  See above Labs/ tests ordered today include:see above  Disposition:   FU:  see above  Lennie Muckle, NP  07/24/2015 12:35 PM    [facility] [place], [place], [place], [place] Phone: [phone]; Fax: [phone]

## 2015-07-24 NOTE — Patient Instructions (Addendum)
Get your blood work before you leave   Next visit  for a physical exam in 3 months, fasting      Please schedule an appointment at the front desk

## 2015-07-25 ENCOUNTER — Ambulatory Visit (HOSPITAL_COMMUNITY): Payer: Medicare Other | Attending: Cardiology

## 2015-07-25 DIAGNOSIS — R0602 Shortness of breath: Secondary | ICD-10-CM | POA: Diagnosis not present

## 2015-07-25 DIAGNOSIS — R0609 Other forms of dyspnea: Secondary | ICD-10-CM | POA: Diagnosis not present

## 2015-07-25 DIAGNOSIS — R079 Chest pain, unspecified: Secondary | ICD-10-CM | POA: Diagnosis not present

## 2015-07-25 DIAGNOSIS — I10 Essential (primary) hypertension: Secondary | ICD-10-CM | POA: Insufficient documentation

## 2015-07-25 LAB — MYOCARDIAL PERFUSION IMAGING
LV dias vol: 147 mL
LV sys vol: 76 mL
Peak HR: 91 {beats}/min
RATE: 0.33
Rest HR: 77 {beats}/min
SDS: 0
SRS: 7
SSS: 7
TID: 0.96

## 2015-07-25 MED ORDER — REGADENOSON 0.4 MG/5ML IV SOLN
0.4000 mg | Freq: Once | INTRAVENOUS | Status: AC
  Administered 2015-07-25: 0.4 mg via INTRAVENOUS

## 2015-07-25 MED ORDER — TECHNETIUM TC 99M SESTAMIBI GENERIC - CARDIOLITE
33.0000 | Freq: Once | INTRAVENOUS | Status: AC | PRN
  Administered 2015-07-25: 33 via INTRAVENOUS

## 2015-07-25 MED ORDER — TECHNETIUM TC 99M SESTAMIBI GENERIC - CARDIOLITE
10.2000 | Freq: Once | INTRAVENOUS | Status: AC | PRN
  Administered 2015-07-25: 10 via INTRAVENOUS

## 2015-07-26 ENCOUNTER — Telehealth: Payer: Self-pay | Admitting: Endocrinology

## 2015-07-26 MED ORDER — TAMOXIFEN CITRATE 10 MG PO TABS: 10.0000 mg | ORAL_TABLET | Freq: Two times a day (BID) | ORAL | Status: DC

## 2015-07-26 NOTE — Telephone Encounter (Signed)
Please call in enough for 6 or 7 days of the tamoxifen, he is completely out, please call into [facility]

## 2015-07-26 NOTE — Telephone Encounter (Signed)
Rx submitted per pt's request.  

## 2015-07-26 NOTE — Telephone Encounter (Signed)
See note below. Patient is not on current med list. Thanks!

## 2015-07-26 NOTE — Telephone Encounter (Signed)
i refilled 

## 2015-07-26 NOTE — Telephone Encounter (Signed)
Patient need a refill of Tamoxifen send to  (I did not see in medication list) [facility] [place] [address] [place] [phone] (Phone) [phone] (Fax)

## 2015-07-29 NOTE — Telephone Encounter (Signed)
Received fax confirmation on 07/29/2015 at 1:56 PM.

## 2015-07-29 NOTE — Telephone Encounter (Signed)
Surgical clearance form faxed to [facility] at [phone] along with OV notes from 07/24/2015. Form sent for scanning into chart.

## 2015-07-31 ENCOUNTER — Telehealth: Payer: Self-pay | Admitting: Internal Medicine

## 2015-07-31 ENCOUNTER — Encounter: Payer: Self-pay | Admitting: *Deleted

## 2015-07-31 NOTE — Telephone Encounter (Signed)
Notes Recorded by Isaiah Serge, NP on 07/25/2015 at 4:02 PM Dr. Caryl Comes, we did nuc study prior to rotator Cuff surgery, EF is decreased from previous tracing. ? Suggestions or is it ok for surgery  The above message was forwarded to Dr. Caryl Comes from Cecilie Kicks, NP.  Results reviewed with Dr. Caryl Comes via phone.  Per Dr. Caryl Comes, the patient is at acceptable cardiovascular risk for surgery. The patient is aware of his results.  Letter of clearance will be faxed to [phone]- Attn: Dr. Theda Sers at [facility].

## 2015-07-31 NOTE — Telephone Encounter (Signed)
New Message  Pt called request stress test results

## 2015-07-31 NOTE — Telephone Encounter (Signed)
Letter of clearance faxed. Confirmation received.

## 2015-08-11 HISTORY — PX: SHOULDER ARTHROSCOPY: SHX128

## 2015-08-12 DIAGNOSIS — H6691 Otitis media, unspecified, right ear: Secondary | ICD-10-CM | POA: Diagnosis not present

## 2015-08-16 DIAGNOSIS — M24111 Other articular cartilage disorders, right shoulder: Secondary | ICD-10-CM | POA: Diagnosis not present

## 2015-08-16 DIAGNOSIS — M75111 Incomplete rotator cuff tear or rupture of right shoulder, not specified as traumatic: Secondary | ICD-10-CM | POA: Diagnosis not present

## 2015-08-16 DIAGNOSIS — S46011A Strain of muscle(s) and tendon(s) of the rotator cuff of right shoulder, initial encounter: Secondary | ICD-10-CM | POA: Diagnosis not present

## 2015-08-16 DIAGNOSIS — G8918 Other acute postprocedural pain: Secondary | ICD-10-CM | POA: Diagnosis not present

## 2015-08-26 ENCOUNTER — Ambulatory Visit: Payer: Medicare Other | Admitting: Endocrinology

## 2015-08-27 DIAGNOSIS — S46011D Strain of muscle(s) and tendon(s) of the rotator cuff of right shoulder, subsequent encounter: Secondary | ICD-10-CM | POA: Diagnosis not present

## 2015-08-27 DIAGNOSIS — Z4789 Encounter for other orthopedic aftercare: Secondary | ICD-10-CM | POA: Diagnosis not present

## 2015-08-30 DIAGNOSIS — S46011D Strain of muscle(s) and tendon(s) of the rotator cuff of right shoulder, subsequent encounter: Secondary | ICD-10-CM | POA: Diagnosis not present

## 2015-09-02 DIAGNOSIS — S46011D Strain of muscle(s) and tendon(s) of the rotator cuff of right shoulder, subsequent encounter: Secondary | ICD-10-CM | POA: Diagnosis not present

## 2015-09-06 DIAGNOSIS — S46011D Strain of muscle(s) and tendon(s) of the rotator cuff of right shoulder, subsequent encounter: Secondary | ICD-10-CM | POA: Diagnosis not present

## 2015-09-09 DIAGNOSIS — S46011D Strain of muscle(s) and tendon(s) of the rotator cuff of right shoulder, subsequent encounter: Secondary | ICD-10-CM | POA: Diagnosis not present

## 2015-09-12 DIAGNOSIS — S46011D Strain of muscle(s) and tendon(s) of the rotator cuff of right shoulder, subsequent encounter: Secondary | ICD-10-CM | POA: Diagnosis not present

## 2015-09-16 DIAGNOSIS — S46011D Strain of muscle(s) and tendon(s) of the rotator cuff of right shoulder, subsequent encounter: Secondary | ICD-10-CM | POA: Diagnosis not present

## 2015-09-19 DIAGNOSIS — S46011D Strain of muscle(s) and tendon(s) of the rotator cuff of right shoulder, subsequent encounter: Secondary | ICD-10-CM | POA: Diagnosis not present

## 2015-09-23 DIAGNOSIS — S46011D Strain of muscle(s) and tendon(s) of the rotator cuff of right shoulder, subsequent encounter: Secondary | ICD-10-CM | POA: Diagnosis not present

## 2015-09-26 DIAGNOSIS — S46011D Strain of muscle(s) and tendon(s) of the rotator cuff of right shoulder, subsequent encounter: Secondary | ICD-10-CM | POA: Diagnosis not present

## 2015-09-30 DIAGNOSIS — S46011D Strain of muscle(s) and tendon(s) of the rotator cuff of right shoulder, subsequent encounter: Secondary | ICD-10-CM | POA: Diagnosis not present

## 2015-10-07 DIAGNOSIS — S46011D Strain of muscle(s) and tendon(s) of the rotator cuff of right shoulder, subsequent encounter: Secondary | ICD-10-CM | POA: Diagnosis not present

## 2015-10-14 DIAGNOSIS — S46011D Strain of muscle(s) and tendon(s) of the rotator cuff of right shoulder, subsequent encounter: Secondary | ICD-10-CM | POA: Diagnosis not present

## 2015-10-21 DIAGNOSIS — S46011D Strain of muscle(s) and tendon(s) of the rotator cuff of right shoulder, subsequent encounter: Secondary | ICD-10-CM | POA: Diagnosis not present

## 2015-10-28 DIAGNOSIS — S46011D Strain of muscle(s) and tendon(s) of the rotator cuff of right shoulder, subsequent encounter: Secondary | ICD-10-CM | POA: Diagnosis not present

## 2015-10-29 ENCOUNTER — Other Ambulatory Visit: Payer: Self-pay | Admitting: Acute Care

## 2015-10-29 DIAGNOSIS — F17210 Nicotine dependence, cigarettes, uncomplicated: Principal | ICD-10-CM

## 2015-11-04 DIAGNOSIS — Z4789 Encounter for other orthopedic aftercare: Secondary | ICD-10-CM | POA: Diagnosis not present

## 2015-11-04 DIAGNOSIS — S46011D Strain of muscle(s) and tendon(s) of the rotator cuff of right shoulder, subsequent encounter: Secondary | ICD-10-CM | POA: Diagnosis not present

## 2015-11-11 DIAGNOSIS — S46011D Strain of muscle(s) and tendon(s) of the rotator cuff of right shoulder, subsequent encounter: Secondary | ICD-10-CM | POA: Diagnosis not present

## 2015-11-18 DIAGNOSIS — S46011D Strain of muscle(s) and tendon(s) of the rotator cuff of right shoulder, subsequent encounter: Secondary | ICD-10-CM | POA: Diagnosis not present

## 2015-11-25 DIAGNOSIS — S46011D Strain of muscle(s) and tendon(s) of the rotator cuff of right shoulder, subsequent encounter: Secondary | ICD-10-CM | POA: Diagnosis not present

## 2015-12-02 DIAGNOSIS — S46011D Strain of muscle(s) and tendon(s) of the rotator cuff of right shoulder, subsequent encounter: Secondary | ICD-10-CM | POA: Diagnosis not present

## 2015-12-04 ENCOUNTER — Telehealth: Payer: Self-pay | Admitting: Endocrinology

## 2015-12-04 MED ORDER — TAMOXIFEN CITRATE 10 MG PO TABS: 10.0000 mg | ORAL_TABLET | Freq: Two times a day (BID) | ORAL | Status: DC

## 2015-12-04 NOTE — Telephone Encounter (Signed)
See note below and please advise if ok to refill. Last office visit was 02/21/2015. Thanks!

## 2015-12-04 NOTE — Telephone Encounter (Signed)
PT needs Tamoxifen called into [facility] until he can come to his appt

## 2015-12-04 NOTE — Telephone Encounter (Signed)
Rx submitted

## 2015-12-04 NOTE — Telephone Encounter (Signed)
Please refill x 1 Ov is due  

## 2015-12-09 ENCOUNTER — Other Ambulatory Visit: Payer: Self-pay | Admitting: Internal Medicine

## 2015-12-10 NOTE — Telephone Encounter (Signed)
Medication filled to pharmacy as requested.   

## 2015-12-12 DIAGNOSIS — S46011D Strain of muscle(s) and tendon(s) of the rotator cuff of right shoulder, subsequent encounter: Secondary | ICD-10-CM | POA: Diagnosis not present

## 2015-12-16 DIAGNOSIS — Z4789 Encounter for other orthopedic aftercare: Secondary | ICD-10-CM | POA: Diagnosis not present

## 2015-12-16 DIAGNOSIS — S46011D Strain of muscle(s) and tendon(s) of the rotator cuff of right shoulder, subsequent encounter: Secondary | ICD-10-CM | POA: Diagnosis not present

## 2015-12-17 ENCOUNTER — Other Ambulatory Visit (INDEPENDENT_AMBULATORY_CARE_PROVIDER_SITE_OTHER): Payer: Medicare Other

## 2015-12-17 ENCOUNTER — Encounter: Payer: Self-pay | Admitting: Gastroenterology

## 2015-12-17 ENCOUNTER — Ambulatory Visit (INDEPENDENT_AMBULATORY_CARE_PROVIDER_SITE_OTHER): Payer: Medicare Other | Admitting: Gastroenterology

## 2015-12-17 VITALS — BP 130/96 | HR 84 | Ht 72.0 in | Wt 197.4 lb

## 2015-12-17 DIAGNOSIS — R140 Abdominal distension (gaseous): Secondary | ICD-10-CM | POA: Diagnosis not present

## 2015-12-17 DIAGNOSIS — R1084 Generalized abdominal pain: Secondary | ICD-10-CM

## 2015-12-17 LAB — CBC WITH DIFFERENTIAL/PLATELET
BASOS ABS: 0 10*3/uL (ref 0.0–0.1)
Basophils Relative: 0.3 % (ref 0.0–3.0)
Eosinophils Absolute: 0.1 10*3/uL (ref 0.0–0.7)
Eosinophils Relative: 2.3 % (ref 0.0–5.0)
HCT: 45.3 % (ref 39.0–52.0)
Hemoglobin: 15.7 g/dL (ref 13.0–17.0)
Lymphocytes Relative: 16.6 % (ref 12.0–46.0)
Lymphs Abs: 1 10*3/uL (ref 0.7–4.0)
MCHC: 34.7 g/dL (ref 30.0–36.0)
MCV: 96.3 fl (ref 78.0–100.0)
MONO ABS: 0.9 10*3/uL (ref 0.1–1.0)
Monocytes Relative: 15.9 % — ABNORMAL HIGH (ref 3.0–12.0)
NEUTROS PCT: 64.9 % (ref 43.0–77.0)
Neutro Abs: 3.8 10*3/uL (ref 1.4–7.7)
Platelets: 296 10*3/uL (ref 150.0–400.0)
RBC: 4.7 Mil/uL (ref 4.22–5.81)
RDW: 12.7 % (ref 11.5–15.5)
WBC: 5.9 10*3/uL (ref 4.0–10.5)

## 2015-12-17 MED ORDER — DICYCLOMINE HCL 10 MG PO CAPS: 10.0000 mg | ORAL_CAPSULE | Freq: Three times a day (TID) | ORAL | Status: DC

## 2015-12-17 NOTE — Patient Instructions (Addendum)
Your physician has requested that you go to the basement for the following lab work before leaving today: CBC    If you are age 66 or older, your body mass index should be between 23-30. Your Body mass index is 26.77 kg/(m^2). If this is out of the aforementioned range listed, please consider follow up with your Primary Care Provider.  If you are age 11 or younger, your body mass index should be between 19-25. Your Body mass index is 26.77 kg/(m^2). If this is out of the aformentioned range listed, please consider follow up with your Primary Care Provider.     Food Guidelines for a sensitive stomach  Many people have difficulty digesting certain foods, causing a variety of distressing and embarrassing symptoms such as abdominal pain, bloating and gas.  These foods may need to be avoided or consumed in small amounts.  Here are some tips that might be helpful for you.  1.   Lactose intolerance is the difficulty or complete inability to digest lactose, the natural sugar in milk and anything made from milk.  This condition is harmless, common, and can begin any time during life.  Some people can digest a modest amount of lactose while others cannot tolerate any.  Also, not all dairy products contain equal amounts of lactose.  For example, hard cheeses such as parmesan have less lactose than soft cheeses such as cheddar.  Yogurt has less lactose than milk or cheese.  Many packaged foods (even many brands of bread) have milk, so read ingredient lists carefully.  It is difficult to test for lactose intolerance, so just try avoiding lactose as much as possible for a week and see what happens with your symptoms.  If you seem to be lactose intolerant, the best plan is to avoid it (but make sure you get calcium from another source).  The next best thing is to use lactase enzyme supplements, available over the counter everywhere.  Just know that many lactose intolerant people need to take several tablets with each  serving of dairy to avoid symptoms.  Lastly, a lot of restaurant food is made with milk or butter.  Many are things you might not suspect, such as mashed potatoes, rice and pasta (cooked with butter) and "grilled" items.  If you are lactose intolerant, it never hurts to ask your server what has milk or butter.  2.   Fiber is an important part of your diet, but not all fiber is well-tolerated.  Insoluble fiber such as bran is often consumed by normal gut bacteria and converted into gas.  Soluble fiber such as oats, squash, carrots and green beans are typically tolerated better.  3.   Some types of carbohydrates can be poorly digested.  Examples include: fructose (apples, cherries, pears, raisins and other dried fruits), fructans (onions, zucchini, large amounts of wheat), sorbitol/mannitol/xylitol and sucralose/Splenda (common artificial sweeteners), and raffinose (lentils, broccoli, cabbage, asparagus, brussel sprouts, many types of beans).  Do a Development worker, community for [employer] and you will find helpful information. Beano, a dietary supplement, will often help with raffinose-containing foods.  As with lactase tablets, you may need several per serving.  4.   Whenever possible, avoid processed food&meats and chemical additives.  High fructose corn syrup, a common sweetener, may be difficult to digest.  Eggs and soy (comes from the soybean, and added to many foods now) are the other most common bloating/gassy foods.  - Dr. [facility]   Please try to  stop smoking and decrease your beer intake.

## 2015-12-17 NOTE — Progress Notes (Signed)
[facility] Progress Note  Chief Complaint: Generalized abdominal pain.  Subjective History:  This is a 65 year old man seeing me for the first time. His last visit to the clinic was for a routine colonoscopy with Dr. Sharlett Iles in November 2014, at which time 4 tubular adenomas were removed. He reports that for the last few months he has had intermittent abdominal pain characterized by a burning discomfort that is over the entire mid abdomen. There are also some lower abdominal cramps with bloating and gas at times. This current episode of abdominal burning is been going on for about a week with no relief. However, he has no rectal bleeding, and occasional loose stool, stable weight and no other associated GI symptoms. He has noticed no clear triggers or relieving factors for the symptoms. ROS: Cardiovascular:  no chest pain Respiratory: no dyspnea  The patient's Past Medical, Family and Social History were reviewed and are on file in the EMR. Unfortunately, he continues to smoke. He also drinks beer daily, and had 5 beers last evening. Objective:  Med list reviewed  Vital signs in last 24 hrs: Filed Vitals:   12/17/15 1115  BP: 130/96  Pulse: 84    Physical Exam   HEENT: sclera anicteric, oral mucosa moist without lesions  Neck: supple, no thyromegaly, JVD or lymphadenopathy  Cardiac: RRR without murmurs, S1S2 heard, no peripheral edema  Pulm: clear to auscultation bilaterally, normal RR and effort noted  Abdomen: No  Bruit,  soft, Very mild RUQ tenderness with repeated palpation, with active bowel sounds. No guarding or palpable hepatosplenomegaly.  Skin; warm and dry, no jaundice or rash  No recent labs    @ASSESSMENTPLANBEGIN @ Assessment: Encounter Diagnoses  Name Primary?  . Generalized abdominal pain Yes  . Abdominal bloating    This is difficult to characterize given its lack of associated symptoms. He also reports he has many food sensitivities the cause  and to belch or have nausea. Sounds like it may all largely be functional, and perhaps exacerbated by tobacco and alcohol use. There may be some food sensitivities as well.   Plan: Although there are no red flag symptoms, I have ordered a CBC to make sure he is not anemic or have a markedly elevated WBC that would possibly indicate something worrisome. Diet advice was given Decrease alcohol use He was given a one-week trial of dicyclomine, confirmed that he does not have glaucoma.   25 minute visit, over half spent reviewing records, counseling and coordinating care. Nelida Meuse III

## 2015-12-20 ENCOUNTER — Ambulatory Visit: Payer: Medicare Other | Admitting: Endocrinology

## 2015-12-24 ENCOUNTER — Ambulatory Visit (INDEPENDENT_AMBULATORY_CARE_PROVIDER_SITE_OTHER): Payer: Medicare Other | Admitting: Allergy and Immunology

## 2015-12-24 ENCOUNTER — Encounter: Payer: Self-pay | Admitting: Allergy and Immunology

## 2015-12-24 VITALS — BP 136/98 | HR 80 | Resp 20 | Ht 70.32 in | Wt 194.6 lb

## 2015-12-24 DIAGNOSIS — K219 Gastro-esophageal reflux disease without esophagitis: Secondary | ICD-10-CM | POA: Diagnosis not present

## 2015-12-24 DIAGNOSIS — H1010 Acute atopic conjunctivitis, unspecified eye: Secondary | ICD-10-CM

## 2015-12-24 DIAGNOSIS — J309 Allergic rhinitis, unspecified: Secondary | ICD-10-CM | POA: Diagnosis not present

## 2015-12-24 DIAGNOSIS — J4530 Mild persistent asthma, uncomplicated: Secondary | ICD-10-CM | POA: Diagnosis not present

## 2015-12-24 DIAGNOSIS — Z720 Tobacco use: Secondary | ICD-10-CM | POA: Diagnosis not present

## 2015-12-24 MED ORDER — ESOMEPRAZOLE MAGNESIUM 40 MG PO CPDR: 40.0000 mg | DELAYED_RELEASE_CAPSULE | Freq: Every day | ORAL | Status: DC

## 2015-12-24 MED ORDER — ALBUTEROL SULFATE HFA 108 (90 BASE) MCG/ACT IN AERS: INHALATION_SPRAY | RESPIRATORY_TRACT | Status: DC

## 2015-12-24 MED ORDER — IPRATROPIUM BROMIDE 0.06 % NA SOLN: NASAL | Status: DC

## 2015-12-24 MED ORDER — AZELASTINE HCL 0.1 % NA SOLN: NASAL | Status: DC

## 2015-12-24 MED ORDER — FLUTICASONE PROPIONATE 50 MCG/ACT NA SUSP: NASAL | Status: DC

## 2015-12-24 NOTE — Patient Instructions (Addendum)
  1. Continue Proventil HFA 2 puffs every 4-6 hours if needed  2. Continue Nexium 40 mg daily  3. Continue Flonase one spray each nostril twice a day  4. Continue Astelin 2 sprays each nostril twice a day  5. Can add nasal ipratropium 0.06% 2 sprays each nostril every 6 hours if needed  6. Can add over-the-counter antihistamine if needed  7. Decrease and taper off smoke exposure  8. Return to clinic in 1 year or earlier if problem

## 2015-12-24 NOTE — Progress Notes (Signed)
Follow-up Note  Referring Provider: Colon Branch, MD Primary Provider: Kathlene November, MD Date of Office Visit: 12/24/2015  Subjective:   Sean Riley (DOB: March 09, 1951) is a 65 y.o. male who returns to the [facility] on 12/24/2015 in re-evaluation of the following:  HPI: Sean Riley returns to this clinic in reevaluation of his obstructive lung disease with component of asthma, extensive tobacco use, allergic rhinitis, and gastroesophageal reflux disease. I've not seen him in his clinic in 1 year.  During the interval he's done okay regarding his respiratory tract. He has not required a systemic steroid or an antibiotic to treat any problem with his respiratory tract. He does use a short acting bronchodilator on a daily basis. He is not interested in using any other type of medication to treat this condition. He smokes greater than 2 packs per day. He can apparently exercise without any problem and can sleep at nighttime without any respiratory tract problems.  His nose is been doing okay while using a combination of Flonase and Astelin. He still occasionally gets drainage down his throat and he would like to restart his nasal ipratropium as this does help dry up his nose.  His reflux is under very good control as long as he continues to use Nexium 40 mg daily.    Medication List           albuterol 108 (90 Base) MCG/ACT inhaler  Commonly known as:  PROVENTIL HFA;VENTOLIN HFA  Inhale 2 puffs into the lungs every 4 (four) hours as needed for wheezing or shortness of breath.     amLODipine 5 MG tablet  Commonly known as:  NORVASC  Take 1 tablet (5 mg total) by mouth daily.     azelastine 0.1 % nasal spray  Commonly known as:  ASTELIN  Place 1 spray into both nostrils as needed. Reported on 07/24/2015     calcipotriene-betamethasone ointment  Commonly known as:  TACLONEX  Apply 1 application topically daily.     dicyclomine 10 MG capsule  Commonly known as:  BENTYL    Take 1 capsule (10 mg total) by mouth 3 (three) times daily before meals.     flecainide 100 MG tablet  Commonly known as:  TAMBOCOR  Take 1 tablet (100 mg total) by mouth 2 (two) times daily.     fluticasone 50 MCG/ACT nasal spray  Commonly known as:  FLONASE  Place 1 spray into both nostrils as needed for allergies or rhinitis. Reported on 07/24/2015     NEXIUM 40 MG capsule  Generic drug:  esomeprazole  Take 1 capsule (40 mg total) by mouth daily before breakfast.     rosuvastatin 5 MG tablet  Commonly known as:  CRESTOR  Take 1 tablet (5 mg total) by mouth daily.     sildenafil 100 MG tablet  Commonly known as:  VIAGRA  Take 0.5-1 tablets (50-100 mg total) by mouth daily as needed for erectile dysfunction.     tamoxifen 10 MG tablet  Commonly known as:  NOLVADEX  Take 1 tablet (10 mg total) by mouth 2 (two) times daily.        Past Medical History  Diagnosis Date  . Atrial fibrillation ([place])   . HYPERLIPIDEMIA   . 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 Microscopic hematuria     negative cystoscopy  . NONSPECIFIC ABNORMAL ELECTROCARDIOGRAM   . GERD (gastroesophageal reflux disease)   . Varicose veins  Past Surgical History  Procedure Laterality Date  . Tracheostomy      age 40 for croup  . Knee arthroscopy  2012    Dr Theda Sers  . Shoulder arthroscopy  2011    Dr Theda Sers  . Coronary angioplasty      04/24/13 : he denies angioplasty  . Appendectomy    . Lumbar laminectomy  1987    Dr Durward Fortes  . Rotator cuff repair    . Spinal fusion  1986    Dr Rolin Barry  . Myelogram  2007  . Cardiac catheterization       X3;Dr Caryl Comes, last in July, 2013  . Colonoscopy  last 06-2013     Dr Sharlett Iles  . Cystoscopy  1978    Dr Hartley Barefoot    Allergies  Allergen Reactions  . Simvastatin Other (See Comments)    REACTION: mental status changes  . Celecoxib Other (See Comments)    REACTION: GI UPSET AND INFLAMMATION  . Codeine     REACTION: GI UPSET AND  INFLAMMATION  . Nsaids Other (See Comments)    REACTION: GI UPSET AND INFLAMMATION  . Cephalexin Itching and Rash    Review of systems negative except as noted in HPI / PMHx or noted below:  Review of Systems  Constitutional: Negative.   HENT: Negative.   Eyes: Negative.   Respiratory: Negative.   Cardiovascular: Negative.   Gastrointestinal: Negative.   Genitourinary: Negative.   Musculoskeletal: Negative.   Skin: Negative.   Neurological: Negative.   Endo/Heme/Allergies: Negative.   Psychiatric/Behavioral: Negative.      Objective:   Filed Vitals:   12/24/15 1159  BP: 136/98  Pulse: 80  Resp: 20   Height: 5' 10.32" (178.6 cm)  Weight: 194 lb 9.6 oz (88.27 kg)   Physical Exam  Constitutional: He is well-developed, well-nourished, and in no distress.  HENT:  Head: Normocephalic.  Right Ear: Tympanic membrane, external ear and ear canal normal.  Left Ear: Tympanic membrane, external ear and ear canal normal.  Nose: Nose normal. No mucosal edema or rhinorrhea.  Mouth/Throat: Uvula is midline, oropharynx is clear and moist and mucous membranes are normal. No oropharyngeal exudate.  Eyes: Conjunctivae are normal.  Neck: Trachea normal. No tracheal tenderness present. No tracheal deviation present. No thyromegaly present.  Cardiovascular: Normal rate, regular rhythm, S1 normal, S2 normal and normal heart sounds.   No murmur heard. Pulmonary/Chest: Breath sounds normal. No stridor. No respiratory distress. He has no wheezes. He has no rales.  Musculoskeletal: He exhibits no edema.  Lymphadenopathy:       Head (right side): No tonsillar adenopathy present.       Head (left side): No tonsillar adenopathy present.    He has no cervical adenopathy.  Neurological: He is alert. Gait normal.  Skin: No rash noted. He is not diaphoretic. No erythema. Nails show no clubbing.  Psychiatric: Mood and affect normal.    Diagnostics:    Spirometry was performed and demonstrated  an FEV1 of 1.76 at 80 % of predicted.  The patient had an Asthma Control Test with the following results: ACT Total Score: 17.    Assessment and Plan:   1. Asthma, well controlled, mild persistent   2. Allergic rhinoconjunctivitis   3. Gastroesophageal reflux disease, esophagitis presence not specified   4. Tobacco use     1. Continue Proventil HFA 2 puffs every 4-6 hours if needed  2. Continue Nexium 40 mg daily  3. Continue Flonase one spray each  nostril twice a day  4. Continue Astelin 2 sprays each nostril twice a day  5. Can add nasal ipratropium 0.06% 2 sprays each nostril every 6 hours if needed  6. Can add over-the-counter antihistamine if needed  7. Decrease and taper off smoke exposure  8. Return to clinic in 1 year or earlier if problem  Sajjad is doing okay at this point and we will refill his medications and see him back in this clinic in 1 year. I had a talk with he and his wife today about their smoking hobby and how their life would be much healthier if they would discontinue this hobby. Certainly there does not appear to be much interest on their part in stopping. He will continue to use anti-inflammatory medications for his respiratory tract and a proton pump inhibitor for his reflux and he will contact me should there be a significant problem.  Allena Katz, MD [place]

## 2015-12-26 DIAGNOSIS — D225 Melanocytic nevi of trunk: Secondary | ICD-10-CM | POA: Diagnosis not present

## 2015-12-26 DIAGNOSIS — L821 Other seborrheic keratosis: Secondary | ICD-10-CM | POA: Diagnosis not present

## 2015-12-26 DIAGNOSIS — L400 Psoriasis vulgaris: Secondary | ICD-10-CM | POA: Diagnosis not present

## 2015-12-26 DIAGNOSIS — D1801 Hemangioma of skin and subcutaneous tissue: Secondary | ICD-10-CM | POA: Diagnosis not present

## 2016-01-09 ENCOUNTER — Encounter: Payer: Self-pay | Admitting: Internal Medicine

## 2016-01-09 ENCOUNTER — Ambulatory Visit (INDEPENDENT_AMBULATORY_CARE_PROVIDER_SITE_OTHER): Payer: Medicare Other | Admitting: Internal Medicine

## 2016-01-09 VITALS — HR 81 | Ht 73.0 in | Wt 195.8 lb

## 2016-01-09 DIAGNOSIS — I480 Paroxysmal atrial fibrillation: Secondary | ICD-10-CM | POA: Diagnosis not present

## 2016-01-09 DIAGNOSIS — I10 Essential (primary) hypertension: Secondary | ICD-10-CM

## 2016-01-09 MED ORDER — FLECAINIDE ACETATE 100 MG PO TABS: 100.0000 mg | ORAL_TABLET | Freq: Two times a day (BID) | ORAL | Status: DC

## 2016-01-09 NOTE — Progress Notes (Signed)
Patient Care Team: Colon Branch, MD as PCP - General (Internal Medicine) Deboraha Sprang, MD as Consulting Physician (Cardiology) Deneise Lever, MD as Consulting Physician (Pulmonary Disease)   HPI  Sean Riley is a 65 y.o. male  Seen in follow for atrial fibrillation. He continues to take flecainide; he's been largely asymptomatic without symptomatic recurrences.  There is a concern that he may be allergic to diltiazem as he has a pruritic rash;  there was an issue with a rash. He isn't back up on diltiazem as it did not resolve off drugs.   He still has complaints of shortness of breath. Pulmonary function testing 5/15 demonstrated mild emphysema..  He has a spell a few months ago when he got off a tractor had 40 seconds of a very unusual sensation with difficulty breathing some lightheadedness no associated palpitations or chest discomfort and then it resolved.  .  There has been no syncope or presyncope.    He underwent Myoview scanning 2013 which was abnormal and underwent catheterization which was normal. He remains on flecainide   Date Dose QRS  5/12  112        5/15 100 116  5/16 100 132  6/17 100 132    Past Medical History  Diagnosis Date  . Atrial fibrillation ([place])   . HYPERLIPIDEMIA   . 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 Microscopic hematuria     negative cystoscopy  . NONSPECIFIC ABNORMAL ELECTROCARDIOGRAM   . GERD (gastroesophageal reflux disease)   . Varicose veins     Past Surgical History  Procedure Laterality Date  . Tracheostomy      age 70 for croup  . Knee arthroscopy  2012    Dr Theda Sers  . Shoulder arthroscopy  2011    Dr Theda Sers  . Coronary angioplasty      04/24/13 : he denies angioplasty  . Appendectomy    . Lumbar laminectomy  1987    Dr Durward Fortes  . Rotator cuff repair    . Spinal fusion  1986    Dr Rolin Barry  . Myelogram  2007  . Cardiac catheterization       X3;Dr Caryl Comes, last in July, 2013  .  Colonoscopy  last 06-2013     Dr Sharlett Iles  . Cystoscopy  1978    Dr Hartley Barefoot    Current Outpatient Prescriptions  Medication Sig Dispense Refill  . albuterol (PROAIR HFA) 108 (90 Base) MCG/ACT inhaler Inhale two puffs every four to six hours as needed for cough or wheeze. 3 Inhaler 0  . amLODipine (NORVASC) 5 MG tablet Take 1 tablet (5 mg total) by mouth daily. 90 tablet 2  . azelastine (ASTELIN) 0.1 % nasal spray Can use two sprays in each nostril twice daily as directed. 90 mL 3  . calcipotriene-betamethasone (TACLONEX) ointment Apply 1 application topically daily.    Marland Kitchen dicyclomine (BENTYL) 10 MG capsule Take 1 capsule (10 mg total) by mouth 3 (three) times daily before meals. 30 capsule 1  . esomeprazole (NEXIUM) 40 MG capsule Take 1 capsule (40 mg total) by mouth daily before breakfast. 90 capsule 3  . flecainide (TAMBOCOR) 100 MG tablet Take 1 tablet (100 mg total) by mouth 2 (two) times daily. 180 tablet 3  . fluticasone (FLONASE) 50 MCG/ACT nasal spray Use one spray in each nostril twice daily. 48 g 3  . ipratropium (ATROVENT) 0.06 % nasal spray Can use two  sprays in each nostril every six hours as needed to dry up nose. 45 mL 3  . rosuvastatin (CRESTOR) 5 MG tablet Take 5 mg by mouth every other day.    . sildenafil (VIAGRA) 100 MG tablet Take 0.5-1 tablets (50-100 mg total) by mouth daily as needed for erectile dysfunction. 5 tablet 3  . tamoxifen (NOLVADEX) 10 MG tablet Take 1 tablet (10 mg total) by mouth 2 (two) times daily. 60 tablet 0   No current facility-administered medications for this visit.    Allergies  Allergen Reactions  . Simvastatin Other (See Comments)    REACTION: mental status changes  . Celecoxib Other (See Comments)    REACTION: GI UPSET AND INFLAMMATION  . Codeine     REACTION: GI UPSET AND INFLAMMATION  . Nsaids Other (See Comments)    REACTION: GI UPSET AND INFLAMMATION  . Cephalexin Itching and Rash    Review of Systems negative except from HPI  and PMH  Physical Exam Pulse 81  Ht 6\' 1"  (1.854 m)  Wt 195 lb 12.8 oz (88.814 kg)  BMI 25.84 kg/m2 Well developed and well nourished in no acute distress HENT normal E scleral and icterus clear Neck Supple JVP flat; carotids brisk and full Clear to ausculation  Regular rate and rhythm, no murmurs gallops or rub Soft with active bowel sounds No clubbing cyanosis  Edema Alert and oriented, grossly normal motor and sensory function Skin Warm and Dry  ECG demonstrated sinus rhythm at 78 Interval 17/13/39 Axis leftward at -40 right bundle branch block  Assessment and Plan  Atrial fibrillation  Hypertension   He told me all about hay rolls and square bales   BP well controlled  6 month to review the initiation of NOACs therapy  Still smoking

## 2016-01-09 NOTE — Patient Instructions (Addendum)
Medication Instructions:  Your physician recommends that you continue on your current medications as directed. Please refer to the Current Medication list given to you today.  Labwork: None ordered.  Testing/Procedures: None ordered.  Follow-Up: Your physician wants you to follow-up in: 6 months with Dr Klein. You will receive a reminder letter in the mail two months in advance. If you don't receive a letter, please call our office to schedule the follow-up appointment.   Any Other Special Instructions Will Be Listed Below (If Applicable).  If you need a refill on your cardiac medications before your next appointment, please call your pharmacy.   

## 2016-01-10 ENCOUNTER — Encounter: Payer: Self-pay | Admitting: Endocrinology

## 2016-01-10 ENCOUNTER — Ambulatory Visit (INDEPENDENT_AMBULATORY_CARE_PROVIDER_SITE_OTHER): Payer: Medicare Other | Admitting: Endocrinology

## 2016-01-10 VITALS — BP 136/70 | HR 82 | Temp 98.9°F | Ht 73.0 in | Wt 195.0 lb

## 2016-01-10 DIAGNOSIS — T887XXA Unspecified adverse effect of drug or medicament, initial encounter: Secondary | ICD-10-CM | POA: Diagnosis not present

## 2016-01-10 DIAGNOSIS — N62 Hypertrophy of breast: Secondary | ICD-10-CM

## 2016-01-10 DIAGNOSIS — Z125 Encounter for screening for malignant neoplasm of prostate: Secondary | ICD-10-CM

## 2016-01-10 DIAGNOSIS — R972 Elevated prostate specific antigen [PSA]: Secondary | ICD-10-CM | POA: Diagnosis not present

## 2016-01-10 DIAGNOSIS — T50905A Adverse effect of unspecified drugs, medicaments and biological substances, initial encounter: Secondary | ICD-10-CM

## 2016-01-10 LAB — CBC WITH DIFFERENTIAL/PLATELET
BASOS ABS: 0 10*3/uL (ref 0.0–0.1)
Basophils Relative: 0.2 % (ref 0.0–3.0)
Eosinophils Absolute: 0.2 10*3/uL (ref 0.0–0.7)
Eosinophils Relative: 2.3 % (ref 0.0–5.0)
HEMATOCRIT: 44.2 % (ref 39.0–52.0)
HEMOGLOBIN: 15.3 g/dL (ref 13.0–17.0)
LYMPHS PCT: 9.6 % — AB (ref 12.0–46.0)
Lymphs Abs: 0.6 10*3/uL — ABNORMAL LOW (ref 0.7–4.0)
MCHC: 34.5 g/dL (ref 30.0–36.0)
MCV: 95.1 fl (ref 78.0–100.0)
MONOS PCT: 13.7 % — AB (ref 3.0–12.0)
Monocytes Absolute: 0.9 10*3/uL (ref 0.1–1.0)
NEUTROS ABS: 4.9 10*3/uL (ref 1.4–7.7)
Neutrophils Relative %: 74.2 % (ref 43.0–77.0)
PLATELETS: 288 10*3/uL (ref 150.0–400.0)
RBC: 4.65 Mil/uL (ref 4.22–5.81)
RDW: 13.4 % (ref 11.5–15.5)
WBC: 6.6 10*3/uL (ref 4.0–10.5)

## 2016-01-10 LAB — PSA, MEDICARE: PSA: 7.18 ng/ml — ABNORMAL HIGH (ref 0.10–4.00)

## 2016-01-10 MED ORDER — TAMOXIFEN CITRATE 10 MG PO TABS: 10.0000 mg | ORAL_TABLET | Freq: Every day | ORAL | Status: DC

## 2016-01-10 NOTE — Progress Notes (Signed)
Subjective:    Patient ID: Sean Riley, male    DOB: [birth year], 65 y.o.   MRN: [id number]  HPI  The state of at least three ongoing medical problems is addressed today, with interval history of each noted here: Pt returns for f/u of idiopathic central hypogonadism and gynecomastia (dx'ed 2015; testosterone normalized with tamoxifen).  Also, since on the tamoxifen, breast swelling has resolved.   ED: sxs persist.   Hypogonadism: no edema Past Medical History  Diagnosis Date  . Atrial fibrillation ([place])   . HYPERLIPIDEMIA   . 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 Microscopic hematuria     negative cystoscopy  . NONSPECIFIC ABNORMAL ELECTROCARDIOGRAM   . GERD (gastroesophageal reflux disease)   . Varicose veins     Past Surgical History  Procedure Laterality Date  . Tracheostomy      age 10 for croup  . Knee arthroscopy  2012    Dr Theda Sers  . Shoulder arthroscopy  2011    Dr Theda Sers  . Coronary angioplasty      04/24/13 : he denies angioplasty  . Appendectomy    . Lumbar laminectomy  1987    Dr Durward Fortes  . Rotator cuff repair    . Spinal fusion  1986    Dr Rolin Barry  . Myelogram  2007  . Cardiac catheterization       X3;Dr Caryl Comes, last in July, 2013  . Colonoscopy  last 06-2013     Dr Sharlett Iles  . Cystoscopy  1978    Dr Hartley Barefoot    Social History   Social History  . Marital Status: Married    Spouse Name: N/A  . Number of Children: 2  . Years of Education: N/A   Occupational History  . Retried    Social History Main Topics  . Smoking status: Current Every Day Smoker -- 1.00 packs/day for 30 years    Types: Cigarettes  . Smokeless tobacco: Never Used     Comment: 1 ppd   . Alcohol Use: 0.0 oz/week    0 Standard drinks or equivalent per week     Comment: 6-7 cans of beer daily  . Drug Use: No  . Sexual Activity: Not on file   Other Topics Concern  . Not on file   Social History Narrative   Lives w/ wife    Current Outpatient  Prescriptions on File Prior to Visit  Medication Sig Dispense Refill  . albuterol (PROAIR HFA) 108 (90 Base) MCG/ACT inhaler Inhale two puffs every four to six hours as needed for cough or wheeze. 3 Inhaler 0  . amLODipine (NORVASC) 5 MG tablet Take 1 tablet (5 mg total) by mouth daily. 90 tablet 2  . azelastine (ASTELIN) 0.1 % nasal spray Can use two sprays in each nostril twice daily as directed. 90 mL 3  . calcipotriene-betamethasone (TACLONEX) ointment Apply 1 application topically daily.    Marland Kitchen dicyclomine (BENTYL) 10 MG capsule Take 1 capsule (10 mg total) by mouth 3 (three) times daily before meals. 30 capsule 1  . esomeprazole (NEXIUM) 40 MG capsule Take 1 capsule (40 mg total) by mouth daily before breakfast. 90 capsule 3  . flecainide (TAMBOCOR) 100 MG tablet Take 1 tablet (100 mg total) by mouth 2 (two) times daily. 180 tablet 3  . fluticasone (FLONASE) 50 MCG/ACT nasal spray Use one spray in each nostril twice daily. 48 g 3  . ipratropium (ATROVENT) 0.06 % nasal spray Can  use two sprays in each nostril every six hours as needed to dry up nose. 45 mL 3  . rosuvastatin (CRESTOR) 5 MG tablet Take 5 mg by mouth every other day.    . sildenafil (VIAGRA) 100 MG tablet Take 0.5-1 tablets (50-100 mg total) by mouth daily as needed for erectile dysfunction. 5 tablet 3   No current facility-administered medications on file prior to visit.    Allergies  Allergen Reactions  . Simvastatin Other (See Comments)    REACTION: mental status changes  . Celecoxib Other (See Comments)    REACTION: GI UPSET AND INFLAMMATION  . Codeine     REACTION: GI UPSET AND INFLAMMATION  . Nsaids Other (See Comments)    REACTION: GI UPSET AND INFLAMMATION  . Cephalexin Itching and Rash    Family History  Problem Relation Age of Onset  . Hypertension Mother   . Hypertension Father   . Benign prostatic hyperplasia Father     S/P TURP  . Heart attack Maternal Grandmother     MI in 58s  . Breast cancer  Maternal Grandmother   . Arrhythmia Brother      X 2  . Heart attack Maternal Aunt     MI in 53s  . Stroke Neg Hx   . Diabetes Neg Hx   . Colon cancer Neg Hx   . Colon polyps Neg Hx   . Stomach cancer Neg Hx   . Rectal cancer Neg Hx   . Esophageal cancer Neg Hx   . Prostate cancer Neg Hx     BP 136/70 mmHg  Pulse 82  Temp(Src) 98.9 F (37.2 C) (Oral)  Ht 6\' 1"  (1.854 m)  Wt 195 lb (88.451 kg)  BMI 25.73 kg/m2  SpO2 94%  Review of Systems Denies decreased urination.  No weight change    Objective:   Physical Exam VITAL SIGNS:  See vs page GENERAL: no distress NECK: There is no palpable thyroid enlargement.  No thyroid nodule is palpable.  No palpable lymphadenopathy at the anterior neck. Chest wall: no gynecomastia GENITALIA: Normal male testicles, scrotum, and penis.   Ext: no edema. PSYCH: Alert and well-oriented.  Does not appear anxious nor depressed.   Lab Results  Component Value Date   TESTOSTERONE 484 01/10/2016   Lab Results  Component Value Date   PSA 7.18* 01/10/2016   PSA 0.53 05/10/2014   PSA 0.46 04/24/2013   Lab Results  Component Value Date   WBC 6.6 01/10/2016   HGB 15.3 01/10/2016   HCT 44.2 01/10/2016   MCV 95.1 01/10/2016   PLT 288.0 01/10/2016      Assessment & Plan:  Elevated PSA, new.  Ref urol Gynecomastia, well-controlled.   Hypogonadism: better, prob due to novaldex.   Patient is advised the following: Patient Instructions  Please reduce the tamoxifen to once a day.  i have sent a prescription to your pharmacy.   blood tests are requested for you today.  We'll let you know about the results.  Please come back for a follow-up appointment in 1 year.      Renato Shin, MD

## 2016-01-10 NOTE — Patient Instructions (Addendum)
Please reduce the tamoxifen to once a day.  i have sent a prescription to your pharmacy.   blood tests are requested for you today.  We'll let you know about the results.  Please come back for a follow-up appointment in 1 year.

## 2016-01-11 DIAGNOSIS — R972 Elevated prostate specific antigen [PSA]: Secondary | ICD-10-CM | POA: Insufficient documentation

## 2016-01-12 DIAGNOSIS — H6692 Otitis media, unspecified, left ear: Secondary | ICD-10-CM | POA: Diagnosis not present

## 2016-01-12 DIAGNOSIS — J0100 Acute maxillary sinusitis, unspecified: Secondary | ICD-10-CM | POA: Diagnosis not present

## 2016-01-12 LAB — TESTOSTERONE,FREE AND TOTAL
TESTOSTERONE: 484 ng/dL (ref 348–1197)
Testosterone, Free: 5.6 pg/mL — ABNORMAL LOW (ref 6.6–18.1)

## 2016-01-17 LAB — ESTRADIOL, FREE
ESTRADIOL: 29 pg/mL (ref <=29)
Estradiol, Free: 0.46 pg/mL — ABNORMAL HIGH (ref <=0.45)

## 2016-02-07 ENCOUNTER — Telehealth: Payer: Self-pay | Admitting: Internal Medicine

## 2016-02-25 NOTE — Telephone Encounter (Signed)
Error

## 2016-02-26 ENCOUNTER — Encounter: Payer: Medicare Other | Admitting: Internal Medicine

## 2016-03-12 ENCOUNTER — Encounter: Payer: Self-pay | Admitting: Internal Medicine

## 2016-03-12 ENCOUNTER — Ambulatory Visit (INDEPENDENT_AMBULATORY_CARE_PROVIDER_SITE_OTHER): Payer: Medicare Other | Admitting: Internal Medicine

## 2016-03-12 VITALS — BP 116/76 | HR 79 | Temp 97.8°F | Resp 16 | Ht 73.0 in | Wt 191.5 lb

## 2016-03-12 DIAGNOSIS — Z1159 Encounter for screening for other viral diseases: Secondary | ICD-10-CM

## 2016-03-12 DIAGNOSIS — Z1211 Encounter for screening for malignant neoplasm of colon: Secondary | ICD-10-CM

## 2016-03-12 DIAGNOSIS — Z0000 Encounter for general adult medical examination without abnormal findings: Secondary | ICD-10-CM | POA: Diagnosis not present

## 2016-03-12 DIAGNOSIS — E785 Hyperlipidemia, unspecified: Secondary | ICD-10-CM

## 2016-03-12 DIAGNOSIS — J42 Unspecified chronic bronchitis: Secondary | ICD-10-CM

## 2016-03-12 DIAGNOSIS — I4891 Unspecified atrial fibrillation: Secondary | ICD-10-CM | POA: Diagnosis not present

## 2016-03-12 DIAGNOSIS — Z136 Encounter for screening for cardiovascular disorders: Secondary | ICD-10-CM

## 2016-03-12 DIAGNOSIS — Z0100 Encounter for examination of eyes and vision without abnormal findings: Secondary | ICD-10-CM

## 2016-03-12 LAB — LIPID PANEL
CHOLESTEROL: 181 mg/dL (ref 0–200)
HDL: 41.4 mg/dL (ref >39.00)
LDL Cholesterol: 116 mg/dL — ABNORMAL HIGH (ref 0–99)
NONHDL: 139.46
Total CHOL/HDL Ratio: 4
Triglycerides: 115 mg/dL (ref 0.0–149.0)
VLDL: 23 mg/dL (ref 0.0–40.0)

## 2016-03-12 LAB — TSH: TSH: 1 u[IU]/mL (ref 0.35–4.50)

## 2016-03-12 LAB — COMPREHENSIVE METABOLIC PANEL
ALK PHOS: 64 U/L (ref 39–117)
ALT: 8 U/L (ref 0–53)
AST: 15 U/L (ref 0–37)
Albumin: 4 g/dL (ref 3.5–5.2)
BILIRUBIN TOTAL: 0.8 mg/dL (ref 0.2–1.2)
BUN: 6 mg/dL (ref 6–23)
CO2: 29 mEq/L (ref 19–32)
CREATININE: 0.88 mg/dL (ref 0.40–1.50)
Calcium: 9.2 mg/dL (ref 8.4–10.5)
Chloride: 97 mEq/L (ref 96–112)
GFR: 92 mL/min (ref >60.00)
GLUCOSE: 84 mg/dL (ref 70–99)
POTASSIUM: 4.4 meq/L (ref 3.5–5.1)
SODIUM: 132 meq/L — AB (ref 135–145)
Total Protein: 7.2 g/dL (ref 6.0–8.3)

## 2016-03-12 NOTE — Assessment & Plan Note (Addendum)
Td 09;  pnm 23--- 2015;  prevnar-- 2015;  Had zostavax @ the pharmacy  Last Cscope 06-2013, 2 polyps, next  2015, overdue , GI sent 2 letter, will refer Prostate ca screening-- DRE PSA essentially neg in 2015, but PSA was high  01-2016, checked by endo, to see  Dr Alinda Money by September  Discussed diet, exercise, etoh and  tobacco abuse- not ready to quit, knows to call when/if ready. He sees a Pharmacist, community regularly. Check a screening for AAA Labs: CMP, FLP, TSH. Request a hep C screening.

## 2016-03-12 NOTE — Progress Notes (Signed)
Subjective:    Patient ID: Sean Riley, male    DOB: 15-Mar-1951, 65 y.o.   MRN: [id number]  DOS:  03/12/2016 Type of visit - description :  Here for Medicare AWV: 1. Risk factors based on Past M, S, F history: reviewed 2. Physical Activities:  Farming, active 3. Depression/mood: neg screening  4. Hearing:  No problems noted or reported  5. ADL's: independent, drives  6. Fall Risk: no recent falls, prevention discussed , see AVS 7. home Safety: does feel safe at home  8. Height, weight, & visual acuity: see VS, s/p cataracts ~ 2 years ago, due to see  eye doctor, referral sent   9. Counseling: provided 10. Labs ordered based on risk factors: if needed  11. Referral Coordination: if needed 12. Care Plan, see assessment and plan , written personalized plan provided , see AVS 13. Cognitive Assessment: motor skills and cognition appropriate for age 63. Care team updated  15. End-of-life care, rec a HC POA  In addition, today we discussed the following:  H/o A Fib: good med compliance, no recent sx HTN: Good med compliance, BP today is excellent COPD: Very seldom uses a rescue  inhaler, oligosymptomatic Increased PSA: Already referred to urology   Review of Systems Constitutional: No fever. No chills. No unexplained wt changes. No unusual sweats  HEENT: No dental problems, no ear discharge, no facial swelling, no voice changes. No eye discharge, no eye  redness , no  intolerance to light   Respiratory: No wheezing , no  difficulty breathing. Occasional cough  Cardiovascular: No CP, no leg swelling , no  Palpitations  GI: no nausea, no vomiting, no diarrhea , no  abdominal pain.  No blood in the stools. No dysphagia, no odynophagia    Endocrine: No polyphagia, no polyuria , no polydipsia  GU: No dysuria, gross hematuria, difficulty urinating. No urinary urgency, no frequency.  Musculoskeletal: No joint swellings or unusual aches or pains  Skin: No change in the color  of the skin, palor , no  Rash  Allergic, immunologic: No environmental allergies , no  food allergies  Neurological: No dizziness no  syncope. No headaches. No diplopia, no slurred, no slurred speech, no motor deficits, no facial  Numbness  Hematological: No enlarged lymph nodes, no easy bruising , no unusual bleedings  Psychiatry: No suicidal ideas, no hallucinations, no beavior problems, no confusion.  No unusual/severe anxiety, no depression   Past Medical History:  Diagnosis Date  . Atrial fibrillation ([place])   . GERD (gastroesophageal reflux disease)   . HYPERLIPIDEMIA   . 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 Microscopic hematuria    negative cystoscopy  . NONSPECIFIC ABNORMAL ELECTROCARDIOGRAM   . Varicose veins     Past Surgical History:  Procedure Laterality Date  . APPENDECTOMY    . CARDIAC CATHETERIZATION      X3;Dr Caryl Comes, last in July, 2013  . CORONARY ANGIOPLASTY     04/24/13 : he denies angioplasty  . CYSTOSCOPY  1978   Dr Hartley Barefoot  . KNEE ARTHROSCOPY  2012   Dr Theda Sers  . LUMBAR LAMINECTOMY  1987   Dr Durward Fortes  . MYELOGRAM  2007  . ROTATOR CUFF REPAIR    . SHOULDER ARTHROSCOPY  2011   Dr Theda Sers  . SHOULDER ARTHROSCOPY Right 08/2015  . SPINAL FUSION  1986   Dr Rolin Barry  . TRACHEOSTOMY     age 47 for croup    Social  History   Social History  . Marital status: Married    Spouse name: N/A  . Number of children: 2  . Years of education: N/A   Occupational History  . 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 Smoking status: Current Every Day Smoker    Packs/day: 1.00    Years: 30.00    Types: Cigarettes  . Smokeless tobacco: Never Used     Comment: 1 ppd   . Alcohol use 0.0 oz/week     Comment: 6-7 cans of beer daily  . Drug use: No  . Sexual activity: Not on file   Other Topics Concern  . Not on file   Social History Narrative   Lives w/ wife     Family History  Problem Relation Age of Onset  .  Hypertension Mother   . Hypertension Father   . Benign prostatic hyperplasia Father     S/P TURP  . Heart attack Maternal Grandmother     MI in 54s  . Breast cancer Maternal Grandmother   . Arrhythmia Brother      X 2  . Heart attack Maternal Aunt     MI in 71s  . Stroke Neg Hx   . Diabetes Neg Hx   . Colon cancer Neg Hx   . Colon polyps Neg Hx   . Stomach cancer Neg Hx   . Rectal cancer Neg Hx   . Esophageal cancer Neg Hx   . Prostate cancer Neg Hx        Medication List       Accurate as of 03/12/16  5:10 PM. Always use your most recent med list.          albuterol 108 (90 Base) MCG/ACT inhaler Commonly known as:  PROAIR HFA Inhale two puffs every four to six hours as needed for cough or wheeze.   amLODipine 5 MG tablet Commonly known as:  NORVASC Take 1 tablet (5 mg total) by mouth daily.   azelastine 0.1 % nasal spray Commonly known as:  ASTELIN Can use two sprays in each nostril twice daily as directed.   calcipotriene-betamethasone ointment Commonly known as:  TACLONEX Apply 1 application topically daily.   esomeprazole 40 MG capsule Commonly known as:  NEXIUM Take 1 capsule (40 mg total) by mouth daily before breakfast.   flecainide 100 MG tablet Commonly known as:  TAMBOCOR Take 1 tablet (100 mg total) by mouth 2 (two) times daily.   fluticasone 50 MCG/ACT nasal spray Commonly known as:  FLONASE Use one spray in each nostril twice daily.   ipratropium 0.06 % nasal spray Commonly known as:  ATROVENT Can use two sprays in each nostril every six hours as needed to dry up nose.   rosuvastatin 5 MG tablet Commonly known as:  CRESTOR Take 5 mg by mouth every other day.   sildenafil 100 MG tablet Commonly known as:  VIAGRA Take 0.5-1 tablets (50-100 mg total) by mouth daily as needed for erectile dysfunction.   tamoxifen 10 MG tablet Commonly known as:  NOLVADEX Take 1 tablet (10 mg total) by mouth daily.          Objective:   Physical  Exam BP 116/76 (BP Location: Left Arm, Patient Position: Sitting, Cuff Size: Normal)   Pulse 79   Temp 97.8 F (36.6 C) (Oral)   Resp 16   Ht 6\' 1"  (1.854 m)   Wt 191 lb 8 oz (86.9 kg)  SpO2 94%   BMI 25.27 kg/m   General:   Well developed, well nourished . NAD.  Neck: No  thyromegaly , normal carotid pulses HEENT:  Normocephalic . Face symmetric, atraumatic Lungs:  Creased breath sounds otherwise clear. Normal respiratory effort, no intercostal retractions, no accessory muscle use. Heart: RRR,  no murmur.  No pretibial edema bilaterally  Abdomen:  Not distended, soft, non-tender. No rebound or rigidity.  No bruit Skin: Exposed areas without rash. Not pale. Not jaundice Neurologic:  alert & oriented X3.  Speech normal, gait appropriate for age and unassisted Strength symmetric and appropriate for age.  Psych: Cognition and judgment appear intact.  Cooperative with normal attention span and concentration.  Behavior appropriate. No anxious or depressed appearing.    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 Tried a cpap. [place] (-) 07-2015 CV: --h/o  Atrial fibrillation Dr Caryl Comes, on NSR, no anticoag --lexiscan 2013 --Cardiac catheterization July 2013: Normal LV FX,, mild nonobstructive CAD Dr. Angelena Form GU: BPH, microscopic hematuria (negative cystoscopy 1978) Gynecomastia, Dr. Loanne Drilling, Rx'd tamoxifen: improvement Sees dermatology  H/o  Tracheostomy , age 30, croup  PLAN: Hyperlipidemia: On Crestor, check a CMP, FLP COPD: Minimal sxs, has a CT chest already  Schedule (his #2 CT for lung ca screening) H/o atrial fibrillation, on nsr, not anticoagulated. Recommend to stay on aspirin.  RTC 6 months

## 2016-03-12 NOTE — Progress Notes (Signed)
Pre visit review using our clinic review tool, if applicable. No additional management support is needed unless otherwise documented below in the visit note. 

## 2016-03-12 NOTE — Patient Instructions (Signed)
Get your blood work before you leave    Please consider visit these websites for more information about the healthcare power of attorney  www.begintheconversation.org  theconversationproject.org  Next visit in 6 months  Fall Prevention and Home Safety Falls cause injuries and can affect all age groups. It is possible to use preventive measures to significantly decrease the likelihood of falls. There are many simple measures which can make your home safer and prevent falls. OUTDOORS  Repair cracks and edges of walkways and driveways.  Remove high doorway thresholds.  Trim shrubbery on the main path into your home.  Have good outside lighting.  Clear walkways of tools, rocks, debris, and clutter.  Check that handrails are not broken and are securely fastened. Both sides of steps should have handrails.  Have leaves, snow, and ice cleared regularly.  Use sand or salt on walkways during winter months.  In the garage, clean up grease or oil spills. BATHROOM  Install night lights.  Install grab bars by the toilet and in the tub and shower.  Use non-skid mats or decals in the tub or shower.  Place a plastic non-slip stool in the shower to sit on, if needed.  Keep floors dry and clean up all water on the floor immediately.  Remove soap buildup in the tub or shower on a regular basis.  Secure bath mats with non-slip, double-sided rug tape.  Remove throw rugs and tripping hazards from the floors. BEDROOMS  Install night lights.  Make sure a bedside light is easy to reach.  Do not use oversized bedding.  Keep a telephone by your bedside.  Have a firm chair with side arms to use for getting dressed.  Remove throw rugs and tripping hazards from the floor. KITCHEN  Keep handles on pots and pans turned toward the center of the stove. Use back burners when possible.  Clean up spills quickly and allow time for drying.  Avoid walking on wet floors.  Avoid hot  utensils and knives.  Position shelves so they are not too high or low.  Place commonly used objects within easy reach.  If necessary, use a sturdy step stool with a grab bar when reaching.  Keep electrical cables out of the way.  Do not use floor polish or wax that makes floors slippery. If you must use wax, use non-skid floor wax.  Remove throw rugs and tripping hazards from the floor. STAIRWAYS  Never leave objects on stairs.  Place handrails on both sides of stairways and use them. Fix any loose handrails. Make sure handrails on both sides of the stairways are as long as the stairs.  Check carpeting to make sure it is firmly attached along stairs. Make repairs to worn or loose carpet promptly.  Avoid placing throw rugs at the top or bottom of stairways, or properly secure the rug with carpet tape to prevent slippage. Get rid of throw rugs, if possible.  Have an electrician put in a light switch at the top and bottom of the stairs. OTHER FALL PREVENTION TIPS  Wear low-heel or rubber-soled shoes that are supportive and fit well. Wear closed toe shoes.  When using a stepladder, make sure it is fully opened and both spreaders are firmly locked. Do not climb a closed stepladder.  Add color or contrast paint or tape to grab bars and handrails in your home. Place contrasting color strips on first and last steps.  Learn and use mobility aids as needed. Install an Dealer emergency  response system.  Turn on lights to avoid dark areas. Replace light bulbs that burn out immediately. Get light switches that glow.  Arrange furniture to create clear pathways. Keep furniture in the same place.  Firmly attach carpet with non-skid or double-sided tape.  Eliminate uneven floor surfaces.  Select a carpet pattern that does not visually hide the edge of steps.  Be aware of all pets. OTHER HOME SAFETY TIPS  Set the water temperature for 120 F (48.8 C).  Keep emergency numbers on  or near the telephone.  Keep smoke detectors on every level of the home and near sleeping areas. Document Released: 07/17/2002 Document Revised: 01/26/2012 Document Reviewed: 10/16/2011 [facility] Patient Information 2015 [place], [place]. This information is not intended to replace advice given to you by your health care provider. Make sure you discuss any questions you have with your health care provider.   Preventive Care for Adults Ages 32 and over  Blood pressure check.** / Every 1 to 2 years.  Lipid and cholesterol check.**/ Every 5 years beginning at age 41.  Lung cancer screening. / Every year if you are aged 26-80 years and have a 30-pack-year history of smoking and currently smoke or have quit within the past 15 years. Yearly screening is stopped once you have quit smoking for at least 15 years or develop a health problem that would prevent you from having lung cancer treatment.  Fecal occult blood test (FOBT) of stool. / Every year beginning at age 62 and continuing until age 22. You may not have to do this test if you get a colonoscopy every 10 years.  Flexible sigmoidoscopy** or colonoscopy.** / Every 5 years for a flexible sigmoidoscopy or every 10 years for a colonoscopy beginning at age 60 and continuing until age 41.  Hepatitis C blood test.** / For all people born from [birth year] through 1966 and any individual with known risks for hepatitis C.  Abdominal aortic aneurysm (AAA) screening.** / A one-time screening for ages 10 to 29 years who are current or former smokers.  Skin self-exam. / Monthly.  Influenza vaccine. / Every year.  Tetanus, diphtheria, and acellular pertussis (Tdap/Td) vaccine.** / 1 dose of Td every 10 years.  Varicella vaccine.** / Consult your health care provider.  Zoster vaccine.** / 1 dose for adults aged 46 years or older.  Pneumococcal 13-valent conjugate (PCV13) vaccine.** / Consult your health care provider.  Pneumococcal polysaccharide (PPSV23)  vaccine.** / 1 dose for all adults aged 46 years and older.  Meningococcal vaccine.** / Consult your health care provider.  Hepatitis A vaccine.** / Consult your health care provider.  Hepatitis B vaccine.** / Consult your health care provider.  Haemophilus influenzae type b (Hib) vaccine.** / Consult your health care provider. **Family history and personal history of risk and conditions may change your health care provider's recommendations. Document Released: 09/22/2001 Document Revised: 08/01/2013 Document Reviewed: 12/22/2010 [facility] Patient Information 2015 [place], [place]. This information is not intended to replace advice given to you by your health care provider. Make sure you discuss any questions you have with your health care provider.

## 2016-03-12 NOTE — Assessment & Plan Note (Signed)
Hyperlipidemia: On Crestor, check a CMP, FLP COPD: Minimal sxs, has a CT chest already  Schedule (his #2 CT for lung ca screening) H/o atrial fibrillation, on nsr, not anticoagulated. Recommend to stay on aspirin.  RTC 6 months

## 2016-03-13 LAB — HEPATITIS C ANTIBODY: HCV Ab: NEGATIVE

## 2016-03-17 ENCOUNTER — Ambulatory Visit (HOSPITAL_COMMUNITY)
Admission: RE | Admit: 2016-03-17 | Discharge: 2016-03-17 | Disposition: A | Discharge status: Home / Self Care | Payer: Medicare Other | Source: Ambulatory Visit | Attending: Internal Medicine | Admitting: Internal Medicine

## 2016-03-17 DIAGNOSIS — I700 Atherosclerosis of aorta: Secondary | ICD-10-CM | POA: Insufficient documentation

## 2016-03-17 DIAGNOSIS — I708 Atherosclerosis of other arteries: Secondary | ICD-10-CM | POA: Insufficient documentation

## 2016-03-17 DIAGNOSIS — K219 Gastro-esophageal reflux disease without esophagitis: Secondary | ICD-10-CM | POA: Insufficient documentation

## 2016-03-17 DIAGNOSIS — Z136 Encounter for screening for cardiovascular disorders: Secondary | ICD-10-CM | POA: Diagnosis not present

## 2016-03-17 DIAGNOSIS — E785 Hyperlipidemia, unspecified: Secondary | ICD-10-CM | POA: Diagnosis not present

## 2016-03-19 ENCOUNTER — Ambulatory Visit (INDEPENDENT_AMBULATORY_CARE_PROVIDER_SITE_OTHER)
Admission: RE | Admit: 2016-03-19 | Discharge: 2016-03-19 | Disposition: A | Discharge status: Home / Self Care | Payer: Medicare Other | Source: Ambulatory Visit | Attending: Acute Care | Admitting: Acute Care

## 2016-03-19 DIAGNOSIS — Z87891 Personal history of nicotine dependence: Secondary | ICD-10-CM

## 2016-03-19 DIAGNOSIS — F17210 Nicotine dependence, cigarettes, uncomplicated: Principal | ICD-10-CM

## 2016-03-20 ENCOUNTER — Telehealth: Payer: Self-pay | Admitting: Acute Care

## 2016-03-20 DIAGNOSIS — F17210 Nicotine dependence, cigarettes, uncomplicated: Principal | ICD-10-CM

## 2016-03-20 NOTE — Telephone Encounter (Signed)
I was able to get in touch with Sean Riley wife Sean Riley. I explained to her that Sean Riley scan was read as a Lung RADS 2. Lung RADS 2: Indicates nodules that are benign in appearance and behavior with a very low likelihood of becoming a clinically active cancer due to size or lack of growth. Recommendation per radiology is for a repeat LDCT in 12 months. In addition I explained that the scan revealed atherosclerosis in addition to left main and three-vessel coronary artery disease. I explained that this is a non-gated exam, and cannot determine degree of calcification, just that it is present. The patient is currently taking a statin that has been prescribed by his primary care physician Sean Riley. I told Sean Riley that I would forward the scan results on to Sean Riley to allow him to follow up clinically as he feels is indicated. Sean Riley is also seen by Sean Riley, he will have access to the scan through Epic. Sean Riley shortly she would pass this information on to her husband, and had no further questions upon completion of the call.

## 2016-03-20 NOTE — Telephone Encounter (Signed)
I called to give Sean Riley the results of his low-dose screening CT. There was no answer at either his home number or his mobile number. I left a message requesting that he call the office for his screening results. I left our contact information. We will await his call.

## 2016-03-30 ENCOUNTER — Encounter: Payer: Self-pay | Admitting: Internal Medicine

## 2016-04-23 DIAGNOSIS — H26492 Other secondary cataract, left eye: Secondary | ICD-10-CM | POA: Diagnosis not present

## 2016-04-23 DIAGNOSIS — H01009 Unspecified blepharitis unspecified eye, unspecified eyelid: Secondary | ICD-10-CM | POA: Diagnosis not present

## 2016-04-23 DIAGNOSIS — H26493 Other secondary cataract, bilateral: Secondary | ICD-10-CM | POA: Diagnosis not present

## 2016-04-23 DIAGNOSIS — H0289 Other specified disorders of eyelid: Secondary | ICD-10-CM | POA: Diagnosis not present

## 2016-04-29 DIAGNOSIS — R972 Elevated prostate specific antigen [PSA]: Secondary | ICD-10-CM | POA: Diagnosis not present

## 2016-04-30 ENCOUNTER — Ambulatory Visit (AMBULATORY_SURGERY_CENTER): Payer: Self-pay | Admitting: *Deleted

## 2016-04-30 VITALS — Ht 73.0 in | Wt 193.0 lb

## 2016-04-30 DIAGNOSIS — Z86010 Personal history of colonic polyps: Secondary | ICD-10-CM

## 2016-04-30 MED ORDER — NA SULFATE-K SULFATE-MG SULF 17.5-3.13-1.6 GM/177ML PO SOLN: 1.0000 | Freq: Once | ORAL | 0 refills | Status: AC

## 2016-04-30 NOTE — Progress Notes (Signed)
No egg or soy allergy known to patient  No issues with past sedation with any surgeries  or procedures, no intubation problems  No diet pills per patient No home 02 use per patient  No blood thinners per patient  Pt denies issues with constipation  No A fib episodes since age 65- on flecanide daily  emmi declined

## 2016-05-14 ENCOUNTER — Encounter: Payer: Self-pay | Admitting: Internal Medicine

## 2016-05-14 ENCOUNTER — Ambulatory Visit (AMBULATORY_SURGERY_CENTER): Payer: Medicare Other | Admitting: Internal Medicine

## 2016-05-14 VITALS — BP 145/94 | HR 80 | Temp 98.7°F | Resp 15 | Ht 73.0 in | Wt 193.0 lb

## 2016-05-14 DIAGNOSIS — D124 Benign neoplasm of descending colon: Secondary | ICD-10-CM

## 2016-05-14 DIAGNOSIS — D128 Benign neoplasm of rectum: Secondary | ICD-10-CM | POA: Diagnosis not present

## 2016-05-14 DIAGNOSIS — D122 Benign neoplasm of ascending colon: Secondary | ICD-10-CM | POA: Diagnosis not present

## 2016-05-14 DIAGNOSIS — Z86010 Personal history of colonic polyps: Secondary | ICD-10-CM | POA: Diagnosis present

## 2016-05-14 DIAGNOSIS — D125 Benign neoplasm of sigmoid colon: Secondary | ICD-10-CM | POA: Diagnosis not present

## 2016-05-14 DIAGNOSIS — K635 Polyp of colon: Secondary | ICD-10-CM

## 2016-05-14 DIAGNOSIS — D127 Benign neoplasm of rectosigmoid junction: Secondary | ICD-10-CM | POA: Diagnosis not present

## 2016-05-14 DIAGNOSIS — D123 Benign neoplasm of transverse colon: Secondary | ICD-10-CM

## 2016-05-14 DIAGNOSIS — D129 Benign neoplasm of anus and anal canal: Secondary | ICD-10-CM

## 2016-05-14 DIAGNOSIS — Z1211 Encounter for screening for malignant neoplasm of colon: Secondary | ICD-10-CM | POA: Diagnosis not present

## 2016-05-14 MED ORDER — SODIUM CHLORIDE 0.9 % IV SOLN: 500.0000 mL | INTRAVENOUS | Status: DC

## 2016-05-14 NOTE — Patient Instructions (Signed)
Impression/Recommendations:  Polyps handout given Hemorrhoids handout given Diverticulosis handout given.  Avoid ibuprofen, naproxen, and other NSAIDS, Aspirin for 2 weeks following procedure.  Tylenol only until 05/29/2016 if needed.  Repeat colonoscopy pending results of pathology.  YOU HAD AN ENDOSCOPIC PROCEDURE TODAY AT [facility]:   Refer to the procedure report that was given to you for any specific questions about what was found during the examination.  If the procedure report does not answer your questions, please call your gastroenterologist to clarify.  If you requested that your care partner not be given the details of your procedure findings, then the procedure report has been included in a sealed envelope for you to review at your convenience later.  YOU SHOULD EXPECT: Some feelings of bloating in the abdomen. Passage of more gas than usual.  Walking can help get rid of the air that was put into your GI tract during the procedure and reduce the bloating. If you had a lower endoscopy (such as a colonoscopy or flexible sigmoidoscopy) you may notice spotting of blood in your stool or on the toilet paper. If you underwent a bowel prep for your procedure, you may not have a normal bowel movement for a few days.  Please Note:  You might notice some irritation and congestion in your nose or some drainage.  This is from the oxygen used during your procedure.  There is no need for concern and it should clear up in a day or so.  SYMPTOMS TO REPORT IMMEDIATELY:   Following lower endoscopy (colonoscopy or flexible sigmoidoscopy):  Excessive amounts of blood in the stool  Significant tenderness or worsening of abdominal pains  Swelling of the abdomen that is new, acute  Fever of 100F or higher For urgent or emergent issues, a gastroenterologist can be reached at any hour by calling [phone].   DIET:  We do recommend a small meal at first, but then you may proceed  to your regular diet.  Drink plenty of fluids but you should avoid alcoholic beverages for 24 hours.  ACTIVITY:  You should plan to take it easy for the rest of today and you should NOT DRIVE or use heavy machinery until tomorrow (because of the sedation medicines used during the test).    FOLLOW UP: Our staff will call the number listed on your records the next business day following your procedure to check on you and address any questions or concerns that you may have regarding the information given to you following your procedure. If we do not reach you, we will leave a message.  However, if you are feeling well and you are not experiencing any problems, there is no need to return our call.  We will assume that you have returned to your regular daily activities without incident.  If any biopsies were taken you will be contacted by phone or by letter within the next 1-3 weeks.  Please call us at [phone] if you have not heard about the biopsies in 3 weeks.    SIGNATURES/CONFIDENTIALITY: You and/or your care partner have signed paperwork which will be entered into your electronic medical record.  These signatures attest to the fact that that the information above on your After Visit Summary has been reviewed and is understood.  Full responsibility of the confidentiality of this discharge information lies with you and/or your care-partner.

## 2016-05-14 NOTE — Op Note (Signed)
[place] Patient Name: Sean Riley Procedure Date: 05/14/2016 9:15 AM MRN: [id number] Endoscopist: Jerene Bears , MD Age: 65 Referring MD:  Date of Birth: 04-27-1951 Gender: Male Account #: [id number] Procedure:                Colonoscopy Indications:              High risk colon cancer surveillance: Personal                            history of colonic polyps, Last colonoscopy 3 years                            ago Medicines:                Monitored Anesthesia Care Procedure:                Pre-Anesthesia Assessment:                           - Prior to the procedure, a History and Physical                            was performed, and patient medications and                            allergies were reviewed. The patient's tolerance of                            previous anesthesia was also reviewed. The risks                            and benefits of the procedure and the sedation                            options and risks were discussed with the patient.                            All questions were answered, and informed consent                            was obtained. Prior Anticoagulants: The patient has                            taken no previous anticoagulant or antiplatelet                            agents. ASA Grade Assessment: III - A patient with                            severe systemic disease. After reviewing the risks                            and benefits, the patient was deemed in  satisfactory condition to undergo the procedure.                           After obtaining informed consent, the colonoscope                            was passed under direct vision. Throughout the                            procedure, the patient's blood pressure, pulse, and                            oxygen saturations were monitored continuously. The                            Model CF-HQ190L [id number]) scope was introduced                      through the anus and advanced to the the cecum,                            identified by appendiceal orifice and ileocecal                            valve. The colonoscopy was performed without                            difficulty. The patient tolerated the procedure                            well. The quality of the bowel preparation was                            good. The ileocecal valve, appendiceal orifice, and                            rectum were photographed. Scope In: 9:28:15 AM Scope Out: 10:05:55 AM Scope Withdrawal Time: 0 hours 34 minutes 41 seconds  Total Procedure Duration: 0 hours 37 minutes 40 seconds  Findings:                 The digital rectal exam was normal.                           Two sessile polyps were found in the ascending                            colon. The polyps were 3 to 4 mm in size. These                            polyps were removed with a cold snare. Resection                            and retrieval were complete.  Two sessile polyps were found in the transverse                            colon. The polyps were 4 to 7 mm in size. These                            polyps were removed with a cold snare. Resection                            and retrieval were complete.                           A 15 mm polyp was found in the transverse colon.                            The polyp was sessile. The polyp was removed with a                            hot snare. Resection and retrieval were complete.                           A 8 mm post polypectomy scar was found in the                            transverse colon. There was residual polyp tissue.                            This was biopsied with a cold forceps for histology.                           Two sessile polyps were found in the descending                            colon. The polyps were 5 to 7 mm in size. These                            polyps were  removed with a cold snare. Resection                            and retrieval were complete.                           A 8 mm polyp was found in the sigmoid colon. The                            polyp was sessile. The polyp was removed with a hot                            snare. Resection and retrieval were complete.                           Three sessile polyps  were found in the sigmoid                            colon. The polyps were 5 to 7 mm in size. These                            polyps were removed with a cold snare. Resection                            and retrieval were complete.                           A 7 mm polyp was found in the recto-sigmoid colon.                            The polyp was semi-pedunculated. The polyp was                            removed with a hot snare. Resection and retrieval                            were complete.                           Three sessile polyps were found in the rectum. The                            polyps were 2 to 4 mm in size. These polyps were                            removed with a cold biopsy forceps. Resection and                            retrieval were complete.                           Multiple diverticula were found in the sigmoid                            colon and descending colon.                           Internal hemorrhoids were found during                            retroflexion. The hemorrhoids were medium-sized. Complications:            No immediate complications. Estimated Blood Loss:     Estimated blood loss was minimal. Impression:               - Two 3 to 4 mm polyps in the ascending colon,                            removed with a cold snare. Resected and retrieved.                           -  Two 4 to 7 mm polyps in the transverse colon,                            removed with a cold snare. Resected and retrieved.                           - One 15 mm polyp in the transverse colon, removed                             with a hot snare. Resected and retrieved.                           - Post-polypectomy scar in the transverse colon.                            Biopsied.                           - Two 5 to 7 mm polyps in the descending colon,                            removed with a cold snare. Resected and retrieved.                           - One 8 mm polyp in the sigmoid colon, removed with                            a hot snare. Resected and retrieved.                           - Three 5 to 7 mm polyps in the sigmoid colon,                            removed with a cold snare. Resected and retrieved.                           - One 7 mm polyp at the recto-sigmoid colon,                            removed with a hot snare. Resected and retrieved.                           - Three 2 to 4 mm polyps in the rectum, removed                            with a cold biopsy forceps. Resected and retrieved.                           - Moderate diverticulosis in the sigmoid colon and                            in the descending colon.                           -  Internal hemorrhoids. Recommendation:           - Patient has a contact number available for                            emergencies. The signs and symptoms of potential                            delayed complications were discussed with the                            patient. Return to normal activities tomorrow.                            Written discharge instructions were provided to the                            patient.                           - Resume previous diet.                           - Continue present medications.                           - Avoid ibuprofen, naproxen, or other non-steroidal                            anti-inflammatory drugs for 2 weeks after polyp                            removal.                           - Await pathology results.                           - Repeat colonoscopy is recommended  for                            surveillance of multiple polyps. The colonoscopy                            date will be determined after pathology results                            from today's exam become available for review. Jerene Bears, MD 05/14/2016 10:14:47 AM This report has been signed electronically.

## 2016-05-14 NOTE — Progress Notes (Signed)
A and O x3. Report to RN. Tolerated MAC anesthesia well. 

## 2016-05-14 NOTE — Progress Notes (Signed)
Called to room to assist during endoscopic procedure.  Patient ID and intended procedure confirmed with present staff. Received instructions for my participation in the procedure from the performing physician.  

## 2016-05-15 ENCOUNTER — Telehealth: Payer: Self-pay | Admitting: *Deleted

## 2016-05-15 NOTE — Telephone Encounter (Signed)
  Follow up Call-  Call back number 05/14/2016  Post procedure Call Back phone  # [phone]  Permission to leave phone message Yes  Some recent data might be hidden     Patient questions:  Do you have a fever, pain , or abdominal swelling? No. Pain Score  0 *  Have you tolerated food without any problems? Yes.    Have you been able to return to your normal activities? Yes.    Do you have any questions about your discharge instructions: Diet   No. Medications  No. Follow up visit  No.  Do you have questions or concerns about your Care? No.  Actions: * If pain score is 4 or above: No action needed, pain <4.

## 2016-05-19 ENCOUNTER — Inpatient Hospital Stay: Admission: RE | Admit: 2016-05-19 | Payer: Medicare Other | Source: Ambulatory Visit

## 2016-05-22 ENCOUNTER — Encounter: Payer: Self-pay | Admitting: Internal Medicine

## 2016-05-27 ENCOUNTER — Telehealth: Payer: Self-pay | Admitting: Internal Medicine

## 2016-05-27 NOTE — Telephone Encounter (Signed)
Pathology report reviewed with pt over the phone and questions were answered.

## 2016-07-15 ENCOUNTER — Encounter: Payer: Self-pay | Admitting: Internal Medicine

## 2016-07-15 ENCOUNTER — Ambulatory Visit (INDEPENDENT_AMBULATORY_CARE_PROVIDER_SITE_OTHER): Payer: Medicare Other | Admitting: Internal Medicine

## 2016-07-15 VITALS — BP 114/76 | HR 76 | Ht 73.0 in | Wt 193.4 lb

## 2016-07-15 DIAGNOSIS — I480 Paroxysmal atrial fibrillation: Secondary | ICD-10-CM

## 2016-07-15 DIAGNOSIS — I10 Essential (primary) hypertension: Secondary | ICD-10-CM | POA: Diagnosis not present

## 2016-07-15 NOTE — Progress Notes (Signed)
Patient Care Team: Colon Branch, MD as PCP - General (Internal Medicine) Deboraha Sprang, MD as Consulting Physician (Cardiology) Deneise Lever, MD as Consulting Physician (Pulmonary Disease) Renato Shin, MD as Consulting Physician (Endocrinology) Darleen Crocker, MD as Consulting Physician (Ophthalmology) Doran Stabler, MD as Consulting Physician (Gastroenterology)   HPI  Sean Riley is a 65 y.o. male  Seen in follow for atrial fibrillation. He continues to take flecainide; he's been largely asymptomatic without symptomatic recurrences.  There is a concern that he may be allergic to diltiazem as he has a pruritic rash;  there was an issue with a rash. He isn't back up on diltiazem as it did not resolve off drugs.   He still has complaints of shortness of breath. Pulmonary function testing 5/15 demonstrated mild emphysema..  He has a spell a few months ago when he got off a tractor had 40 seconds of a very unusual sensation with difficulty breathing some lightheadedness no associated palpitations or chest discomfort and then it resolved.  .  There has been no syncope or presyncope.    He underwent Myoview scanning 2013 which was abnormal and underwent catheterization which was normal. He remains on flecainide   Date Dose QRS  5/12  112        5/15 100 116  5/16 100 132  6/17 100 132  12/17 100 136    He remains disinclined to use anticoagulation   His nuisance bleeding is now less that tractor season is over.    Past Medical History:  Diagnosis Date  . Allergy   . Arthritis   . Atrial fibrillation ([place])   . Cataract    removed both eyes   . GERD (gastroesophageal reflux disease)   . HYPERLIPIDEMIA   . 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 Hypertension   . Microscopic hematuria    negative cystoscopy  . NONSPECIFIC ABNORMAL ELECTROCARDIOGRAM   . Varicose veins     Past Surgical History:  Procedure Laterality Date  . APPENDECTOMY      . CARDIAC CATHETERIZATION      X3;Dr Caryl Comes, last in July, 2013  . COLONOSCOPY    . CORONARY ANGIOPLASTY     04/24/13 : he denies angioplasty  . CYSTOSCOPY  1978   Dr Hartley Barefoot  . KNEE ARTHROSCOPY  2012   Dr Theda Sers  . LUMBAR LAMINECTOMY  1987   Dr Durward Fortes  . MYELOGRAM  2007  . POLYPECTOMY    . ROTATOR CUFF REPAIR    . SHOULDER ARTHROSCOPY  2011   Dr Theda Sers  . SHOULDER ARTHROSCOPY Right 08/2015  . SPINAL FUSION  1986   Dr Rolin Barry  . TRACHEOSTOMY     age 66 for croup    Current Outpatient Prescriptions  Medication Sig Dispense Refill  . albuterol (PROAIR HFA) 108 (90 Base) MCG/ACT inhaler Inhale two puffs every four to six hours as needed for cough or wheeze. 3 Inhaler 0  . amLODipine (NORVASC) 5 MG tablet Take 1 tablet (5 mg total) by mouth daily. 90 tablet 2  . azelastine (ASTELIN) 0.1 % nasal spray Can use two sprays in each nostril twice daily as directed. 90 mL 3  . calcipotriene-betamethasone (TACLONEX) ointment Apply 1 application topically daily.    Marland Kitchen esomeprazole (NEXIUM) 40 MG capsule Take 1 capsule (40 mg total) by mouth daily before breakfast. 90 capsule 3  . flecainide (TAMBOCOR) 100 MG tablet Take 1  tablet (100 mg total) by mouth 2 (two) times daily. 180 tablet 3  . fluticasone (FLONASE) 50 MCG/ACT nasal spray Use one spray in each nostril twice daily. 48 g 3  . ipratropium (ATROVENT) 0.06 % nasal spray Can use two sprays in each nostril every six hours as needed to dry up nose. 45 mL 3  . rosuvastatin (CRESTOR) 5 MG tablet Take 5 mg by mouth every other day.    . sildenafil (VIAGRA) 100 MG tablet Take 0.5-1 tablets (50-100 mg total) by mouth daily as needed for erectile dysfunction. 5 tablet 3  . tamoxifen (NOLVADEX) 10 MG tablet Take 1 tablet (10 mg total) by mouth daily. 90 tablet 3   Current Facility-Administered Medications  Medication Dose Route Frequency Provider Last Rate Last Dose  . 0.9 %  sodium chloride infusion  500 mL Intravenous Continuous Jerene Bears, MD        Allergies  Allergen Reactions  . Simvastatin Other (See Comments)    REACTION: mental status changes  . Celecoxib Other (See Comments)    REACTION: GI UPSET AND INFLAMMATION  . Codeine     REACTION: GI UPSET AND INFLAMMATION  . Nsaids Other (See Comments)    REACTION: GI UPSET AND INFLAMMATION  . Cephalexin Itching and Rash    Review of Systems negative except from HPI and PMH  Physical Exam BP 114/76   Pulse 76   Ht 6\' 1"  (1.854 m)   Wt 193 lb 6.4 oz (87.7 kg)   SpO2 98%   BMI 25.52 kg/m  Well developed and well nourished in no acute distress HENT normal E scleral and icterus clear Neck Supple JVP flat; carotids brisk and full Clear to ausculation  Regular rate and rhythm, no murmurs gallops or rub Soft with active bowel sounds No clubbing cyanosis  Edema Alert and oriented, grossly normal motor and sensory function Skin Warm and Dry  ECG demonstrated sinus rhythm at 76 Interval 19/14/48 Axis leftward at -53 right bundle branch block  Assessment and Plan  Atrial fibrillation  Hypertension    BP well controlled  Remains reluctant to take anticoagulation    Still smoking --yuck

## 2016-07-15 NOTE — Patient Instructions (Signed)

## 2016-09-17 ENCOUNTER — Ambulatory Visit: Payer: Medicare Other | Admitting: Internal Medicine

## 2016-10-05 ENCOUNTER — Encounter: Payer: Self-pay | Admitting: Internal Medicine

## 2016-10-05 ENCOUNTER — Ambulatory Visit (INDEPENDENT_AMBULATORY_CARE_PROVIDER_SITE_OTHER): Payer: Medicare Other | Admitting: Internal Medicine

## 2016-10-05 VITALS — BP 132/80 | HR 85 | Temp 98.2°F | Resp 14 | Ht 73.0 in | Wt 192.1 lb

## 2016-10-05 DIAGNOSIS — E785 Hyperlipidemia, unspecified: Secondary | ICD-10-CM

## 2016-10-05 DIAGNOSIS — N62 Hypertrophy of breast: Secondary | ICD-10-CM | POA: Diagnosis not present

## 2016-10-05 DIAGNOSIS — F17200 Nicotine dependence, unspecified, uncomplicated: Secondary | ICD-10-CM | POA: Diagnosis not present

## 2016-10-05 DIAGNOSIS — R972 Elevated prostate specific antigen [PSA]: Secondary | ICD-10-CM | POA: Diagnosis not present

## 2016-10-05 LAB — LIPID PANEL
CHOLESTEROL: 162 mg/dL (ref 0–200)
HDL: 43.8 mg/dL (ref >39.00)
LDL CALC: 97 mg/dL (ref 0–99)
NonHDL: 118.47
TRIGLYCERIDES: 105 mg/dL (ref 0.0–149.0)
Total CHOL/HDL Ratio: 4
VLDL: 21 mg/dL (ref 0.0–40.0)

## 2016-10-05 NOTE — Progress Notes (Signed)
Subjective:    Patient ID: Sean Riley, male    DOB: [birth year], 66 y.o.   MRN: [id number]  DOS:  10/05/2016 Type of visit - description : Routine office visit Interval history: Notes from cardiology reviewed Note from urology reviewed High cholesterol: On Crestor, due for labs, sometimes he forgets to take meds qhs. Tobacco abuse: Still smoking   Review of Systems   Past Medical History:  Diagnosis Date  . Allergy   . Arthritis   . Atrial fibrillation ([place])   . Cataract    removed both eyes   . GERD (gastroesophageal reflux disease)   . HYPERLIPIDEMIA   . 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 Hypertension   . Microscopic hematuria    negative cystoscopy  . NONSPECIFIC ABNORMAL ELECTROCARDIOGRAM   . Varicose veins     Past Surgical History:  Procedure Laterality Date  . APPENDECTOMY    . CARDIAC CATHETERIZATION      X3;Dr Caryl Comes, last in July, 2013  . COLONOSCOPY    . CORONARY ANGIOPLASTY     04/24/13 : he denies angioplasty  . CYSTOSCOPY  1978   Dr Hartley Barefoot  . KNEE ARTHROSCOPY  2012   Dr Theda Sers  . LUMBAR LAMINECTOMY  1987   Dr Durward Fortes  . MYELOGRAM  2007  . POLYPECTOMY    . ROTATOR CUFF REPAIR    . SHOULDER ARTHROSCOPY  2011   Dr Theda Sers  . SHOULDER ARTHROSCOPY Right 08/2015  . SPINAL FUSION  1986   Dr Rolin Barry  . TRACHEOSTOMY     age 5 for croup    Social History   Social History  . Marital status: Married    Spouse name: N/A  . Number of children: 2  . Years of education: N/A   Occupational History  . 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 Smoking status: Current Every Day Smoker    Packs/day: 1.50    Years: 30.00    Types: Cigarettes  . Smokeless tobacco: Never Used     Comment: 1 ppd   . Alcohol use 0.0 oz/week     Comment: 6-7 cans of beer daily  . Drug use: No  . Sexual activity: Not on file   Other Topics Concern  . Not on file   Social History Narrative   Lives w/ wife       Allergies as of 10/05/2016      Reactions   Simvastatin Other (See Comments)   REACTION: mental status changes   Celecoxib Other (See Comments)   REACTION: GI UPSET AND INFLAMMATION   Codeine    REACTION: GI UPSET AND INFLAMMATION   Nsaids Other (See Comments)   REACTION: GI UPSET AND INFLAMMATION   Cephalexin Itching, Rash      Medication List       Accurate as of 10/05/16  5:15 PM. Always use your most recent med list.          albuterol 108 (90 Base) MCG/ACT inhaler Commonly known as:  PROAIR HFA Inhale two puffs every four to six hours as needed for cough or wheeze.   amLODipine 5 MG tablet Commonly known as:  NORVASC Take 1 tablet (5 mg total) by mouth daily.   azelastine 0.1 % nasal spray Commonly known as:  ASTELIN Can use two sprays in each nostril twice daily as directed.   calcipotriene-betamethasone ointment Commonly known as:  TACLONEX Apply 1 application  topically daily.   esomeprazole 40 MG capsule Commonly known as:  NEXIUM Take 1 capsule (40 mg total) by mouth daily before breakfast.   flecainide 100 MG tablet Commonly known as:  TAMBOCOR Take 1 tablet (100 mg total) by mouth 2 (two) times daily.   fluticasone 50 MCG/ACT nasal spray Commonly known as:  FLONASE Use one spray in each nostril twice daily.   ipratropium 0.06 % nasal spray Commonly known as:  ATROVENT Can use two sprays in each nostril every six hours as needed to dry up nose.   rosuvastatin 5 MG tablet Commonly known as:  CRESTOR Take 5 mg by mouth every other day.   sildenafil 100 MG tablet Commonly known as:  VIAGRA Take 0.5-1 tablets (50-100 mg total) by mouth daily as needed for erectile dysfunction.          Objective:   Physical Exam BP 132/80 (BP Location: Left Arm, Patient Position: Sitting, Cuff Size: Normal)   Pulse 85   Temp 98.2 F (36.8 C) (Oral)   Resp 14   Ht 6\' 1"  (1.854 m)   Wt 192 lb 2 oz (87.1 kg)   SpO2 98%   BMI 25.35 kg/m  General:    Well developed, well nourished . NAD.  HEENT:  Normocephalic . Face symmetric, atraumatic Lungs:  CTA B Normal respiratory effort, no intercostal retractions, no accessory muscle use. Heart: RRR,  no murmur.  No pretibial edema bilaterally  Skin: Not pale. Not jaundice Neurologic:  alert & oriented X3.  Speech normal, gait appropriate for age and unassisted Psych--  Cognition and judgment appear intact.  Cooperative with normal attention span and concentration.  Behavior appropriate. No anxious or depressed appearing.      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 Tried a cpap. [place] (-) 07-2015 CV: --P- Atrial fibrillation Dr Caryl Comes, on NSR, no anticoag --lexiscan 2013 --Cardiac catheterization July 2013: Normal LV FX,, mild nonobstructive CAD Dr. Angelena Form GU: BPH, microscopic hematuria (negative cystoscopy 1978) Elevated PSA: saw urology  04-2016 Gynecomastia, Dr. Loanne Drilling  >> Bx (-) 10-2014 ; took tamoxifen temporarily (self d/c 06-2016), improved Hypogonadism-- dx per Dr Loanne Drilling, numbers improved d/t nolvadex  Sees dermatology  H/o  Tracheostomy , age 63, croup  PLAN: Hyperlipidemia On Crestor, check a FLP. Smoker:  long discussion about quitting tobacco,  consider medication, has use the gum before but occasionally has palpitations when he is using it and has a cigarette, recommend to decrease the dose of gum and keep trying to quit. Elevated PSA: saw urology 04-2016, they were planning a bx but apparently they repeated PSA and it came back okay -according to the patient- so the bx was not  done. Recheck a PSA on RTC Gynecomastia ; self DC tamoxifen around December, does not like to take hormones, gynecomastia so far has not returned RTC 6 months, Medicare wellness and routine checkup

## 2016-10-05 NOTE — Progress Notes (Signed)
Pre visit review using our clinic review tool, if applicable. No additional management support is needed unless otherwise documented below in the visit note. 

## 2016-10-05 NOTE — Patient Instructions (Signed)
GO TO THE LAB : Get the blood work     GO TO THE FRONT DESK Schedule your next appointment for a  Medicare wellness and a routine checkup in  6 months

## 2016-10-05 NOTE — Assessment & Plan Note (Signed)
Hyperlipidemia On Crestor, check a FLP. Smoker:  long discussion about quitting tobacco,  consider medication, has use the gum before but occasionally has palpitations when he is using it and has a cigarette, recommend to decrease the dose of gum and keep trying to quit. Elevated PSA: saw urology 04-2016, they were planning a bx but apparently they repeated PSA and it came back okay -according to the patient- so the bx was not  done. Recheck a PSA on RTC Gynecomastia ; self DC tamoxifen around December, does not like to take hormones, gynecomastia so far has not returned RTC 6 months, Medicare wellness and routine checkup

## 2016-11-26 DIAGNOSIS — L0889 Other specified local infections of the skin and subcutaneous tissue: Secondary | ICD-10-CM | POA: Diagnosis not present

## 2016-11-26 DIAGNOSIS — L309 Dermatitis, unspecified: Secondary | ICD-10-CM | POA: Diagnosis not present

## 2016-11-26 DIAGNOSIS — L0109 Other impetigo: Secondary | ICD-10-CM | POA: Diagnosis not present

## 2016-11-26 DIAGNOSIS — L821 Other seborrheic keratosis: Secondary | ICD-10-CM | POA: Diagnosis not present

## 2016-11-26 DIAGNOSIS — D1801 Hemangioma of skin and subcutaneous tissue: Secondary | ICD-10-CM | POA: Diagnosis not present

## 2016-12-18 DIAGNOSIS — L2089 Other atopic dermatitis: Secondary | ICD-10-CM | POA: Diagnosis not present

## 2016-12-18 DIAGNOSIS — L0889 Other specified local infections of the skin and subcutaneous tissue: Secondary | ICD-10-CM | POA: Diagnosis not present

## 2016-12-18 DIAGNOSIS — L738 Other specified follicular disorders: Secondary | ICD-10-CM | POA: Diagnosis not present

## 2017-01-05 ENCOUNTER — Ambulatory Visit (INDEPENDENT_AMBULATORY_CARE_PROVIDER_SITE_OTHER): Payer: Medicare Other | Admitting: Allergy and Immunology

## 2017-01-05 ENCOUNTER — Encounter: Payer: Self-pay | Admitting: Allergy and Immunology

## 2017-01-05 VITALS — BP 144/84 | HR 92 | Resp 20

## 2017-01-05 DIAGNOSIS — J4530 Mild persistent asthma, uncomplicated: Secondary | ICD-10-CM | POA: Diagnosis not present

## 2017-01-05 DIAGNOSIS — J3089 Other allergic rhinitis: Secondary | ICD-10-CM

## 2017-01-05 DIAGNOSIS — Z720 Tobacco use: Secondary | ICD-10-CM | POA: Diagnosis not present

## 2017-01-05 DIAGNOSIS — K219 Gastro-esophageal reflux disease without esophagitis: Secondary | ICD-10-CM | POA: Diagnosis not present

## 2017-01-05 MED ORDER — ALBUTEROL SULFATE HFA 108 (90 BASE) MCG/ACT IN AERS: INHALATION_SPRAY | RESPIRATORY_TRACT | 0 refills | Status: DC

## 2017-01-05 MED ORDER — AZITHROMYCIN 500 MG PO TABS: 500.0000 mg | ORAL_TABLET | Freq: Every day | ORAL | 0 refills | Status: DC

## 2017-01-05 MED ORDER — FLUTICASONE PROPIONATE 50 MCG/ACT NA SUSP: NASAL | 3 refills | Status: DC

## 2017-01-05 MED ORDER — ESOMEPRAZOLE MAGNESIUM 40 MG PO CPDR: 40.0000 mg | DELAYED_RELEASE_CAPSULE | Freq: Every day | ORAL | 3 refills | Status: AC

## 2017-01-05 MED ORDER — AZELASTINE HCL 0.1 % NA SOLN: NASAL | 3 refills | Status: DC

## 2017-01-05 NOTE — Progress Notes (Signed)
Follow-up Note  Referring Provider: Colon Branch, MD Primary Provider: Colon Branch, MD Date of Office Visit: 01/05/2017  Subjective:   Sean Riley (DOB: [birth year]) is a 66 y.o. male who returns to the [facility] on 01/05/2017 in re-evaluation of the following:  HPI: Wildon returns to this clinic in reevaluation of his obstructive lung disease with component of asthma, tobacco use, allergic rhinitis, and reflux. I last saw him in this clinic over 1 year ago.  He did relatively well without the requirement for short acting bronchodilator and very little limitation on his ability to exercise until last week. At that point in time he developed nasal congestion and cough. His head is still congested and he has pressure in his head but no ugly nasal discharge and no inability to smell and no headache. He continues to use Flonase and Astelin every day. He was given nasal ipratropium during his last visit but this agent was too drying.  He believes that his reflux is under good control with Nexium daily.  He has not required a systemic steroid on antibiotics since last being seen in this clinic.  Allergies as of 01/05/2017      Reactions   Simvastatin Other (See Comments)   REACTION: mental status changes   Celecoxib Other (See Comments)   REACTION: GI UPSET AND INFLAMMATION   Codeine    REACTION: GI UPSET AND INFLAMMATION   Nsaids Other (See Comments)   REACTION: GI UPSET AND INFLAMMATION   Cephalexin Itching, Rash      Medication List      albuterol 108 (90 Base) MCG/ACT inhaler Commonly known as:  PROAIR HFA Inhale two puffs every four to six hours as needed for cough or wheeze.   amLODipine 5 MG tablet Commonly known as:  NORVASC Take 1 tablet (5 mg total) by mouth daily.   azelastine 0.1 % nasal spray Commonly known as:  ASTELIN Can use two sprays in each nostril twice daily as directed.   betamethasone dipropionate 0.05 % ointment Commonly known  as:  DIPROLENE   calcipotriene-betamethasone ointment Commonly known as:  TACLONEX Apply 1 application topically daily.   esomeprazole 40 MG capsule Commonly known as:  NEXIUM Take 1 capsule (40 mg total) by mouth daily before breakfast.   flecainide 100 MG tablet Commonly known as:  TAMBOCOR Take 1 tablet (100 mg total) by mouth 2 (two) times daily.   fluticasone 50 MCG/ACT nasal spray Commonly known as:  FLONASE Use one spray in each nostril twice daily.   ipratropium 0.06 % nasal spray Commonly known as:  ATROVENT Can use two sprays in each nostril every six hours as needed to dry up nose.   rosuvastatin 5 MG tablet Commonly known as:  CRESTOR Take 5 mg by mouth every other day.   sildenafil 100 MG tablet Commonly known as:  VIAGRA Take 0.5-1 tablets (50-100 mg total) by mouth daily as needed for erectile dysfunction.   triamcinolone cream 0.1 % Commonly known as:  KENALOG       Past Medical History:  Diagnosis Date  . Allergy   . Arthritis   . Atrial fibrillation ([place])   . Cataract    removed both eyes   . GERD (gastroesophageal reflux disease)   . HYPERLIPIDEMIA   . 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 Hypertension   . Microscopic hematuria    negative cystoscopy  . NONSPECIFIC ABNORMAL ELECTROCARDIOGRAM   .  Varicose veins     Past Surgical History:  Procedure Laterality Date  . APPENDECTOMY    . CARDIAC CATHETERIZATION      X3;Dr Caryl Comes, last in July, 2013  . COLONOSCOPY    . CORONARY ANGIOPLASTY     04/24/13 : he denies angioplasty  . CYSTOSCOPY  1978   Dr Hartley Barefoot  . KNEE ARTHROSCOPY  2012   Dr Theda Sers  . LUMBAR LAMINECTOMY  1987   Dr Durward Fortes  . MYELOGRAM  2007  . POLYPECTOMY    . ROTATOR CUFF REPAIR    . SHOULDER ARTHROSCOPY  2011   Dr Theda Sers  . SHOULDER ARTHROSCOPY Right 08/2015  . SPINAL FUSION  1986   Dr Rolin Barry  . TRACHEOSTOMY     age 89 for croup    Review of systems negative except as noted in HPI / PMHx or  noted below:  Review of Systems  Constitutional: Negative.   HENT: Negative.   Eyes: Negative.   Respiratory: Negative.   Cardiovascular: Negative.   Gastrointestinal: Negative.   Genitourinary: Negative.   Musculoskeletal: Negative.   Skin: Negative.   Neurological: Negative.   Endo/Heme/Allergies: Negative.   Psychiatric/Behavioral: Negative.      Objective:   Vitals:   01/05/17 1042  BP: (!) 144/84  Pulse: 92  Resp: 20          Physical Exam  Constitutional: He is well-developed, well-nourished, and in no distress.  Nasal voice  HENT:  Head: Normocephalic.  Right Ear: Tympanic membrane, external ear and ear canal normal.  Left Ear: Tympanic membrane, external ear and ear canal normal.  Nose: Mucosal edema (erythematous) present. No rhinorrhea.  Mouth/Throat: Uvula is midline and mucous membranes are normal. Posterior oropharyngeal erythema present. No oropharyngeal exudate.  Eyes: Conjunctivae are normal.  Neck: Trachea normal. No tracheal tenderness present. No tracheal deviation present. No thyromegaly present.  Cardiovascular: Normal rate, regular rhythm, S1 normal, S2 normal and normal heart sounds.   No murmur heard. Pulmonary/Chest: Breath sounds normal. No stridor. No respiratory distress. He has no wheezes. He has no rales.  Musculoskeletal: He exhibits no edema.  Lymphadenopathy:       Head (right side): No tonsillar adenopathy present.       Head (left side): No tonsillar adenopathy present.    He has no cervical adenopathy.  Neurological: He is alert. Gait normal.  Skin: No rash noted. He is not diaphoretic. No erythema. Nails show no clubbing.  Psychiatric: Mood and affect normal.    Diagnostics:    Spirometry was performed and demonstrated an FEV1 of 3.03 at 81 % of predicted.  The patient had an Asthma Control Test with the following results: ACT Total Score: 16.    Assessment and Plan:   1. Asthma, well controlled, mild persistent   2.  Other allergic rhinitis   3. Gastroesophageal reflux disease, esophagitis presence not specified   4. Tobacco use     1. Continue Proventil HFA 2 puffs every 4-6 hours if needed  2. Continue Nexium 40 mg daily  3. Continue Flonase one spray each nostril twice a day  4. Continue Astelin 2 sprays each nostril twice a day  5. Prednisone 10mg   - three tablets one time per day for three days  6. Can add over-the-counter antihistamine if needed  7. Return to clinic in 1 year or earlier if problem  I will make the assumption that Gracin has a viral respiratory tract infection and treat him with anti-inflammatory agents  using a relatively low dose for a short period in time with prednisone while he continues to use anti-inflammatory medications on a regular basis in a preventative mode for his airway disease. He certainly has the option of using Proventil if needed and an over-the-counter antihistamine and nasal saline if needed. I've had a talk with him today about smoking once again and made the suggestion that he consider a nicotine replacement for tobacco smoke exposure. If he does well I will see him back in this clinic in 1 year or earlier if a problem.  Allena Katz, MD [facility] [place]

## 2017-01-05 NOTE — Patient Instructions (Signed)
  1. Continue Proventil HFA 2 puffs every 4-6 hours if needed  2. Continue Nexium 40 mg daily  3. Continue Flonase one spray each nostril twice a day  4. Continue Astelin 2 sprays each nostril twice a day  5. Prednisone 10mg   - three tablets one time per day for three days  6. Can add over-the-counter antihistamine if needed  7. Return to clinic in 1 year or earlier if problem

## 2017-01-08 ENCOUNTER — Ambulatory Visit: Payer: Medicare Other | Admitting: Endocrinology

## 2017-02-02 ENCOUNTER — Other Ambulatory Visit: Payer: Self-pay | Admitting: *Deleted

## 2017-02-02 MED ORDER — FLECAINIDE ACETATE 100 MG PO TABS: 100.0000 mg | ORAL_TABLET | Freq: Two times a day (BID) | ORAL | 0 refills | Status: DC

## 2017-02-02 MED ORDER — FLECAINIDE ACETATE 100 MG PO TABS: 100.0000 mg | ORAL_TABLET | Freq: Two times a day (BID) | ORAL | 3 refills | Status: DC

## 2017-02-06 DIAGNOSIS — S0511XA Contusion of eyeball and orbital tissues, right eye, initial encounter: Secondary | ICD-10-CM | POA: Diagnosis not present

## 2017-02-11 DIAGNOSIS — H01009 Unspecified blepharitis unspecified eye, unspecified eyelid: Secondary | ICD-10-CM | POA: Diagnosis not present

## 2017-02-11 DIAGNOSIS — H04123 Dry eye syndrome of bilateral lacrimal glands: Secondary | ICD-10-CM | POA: Diagnosis not present

## 2017-02-11 DIAGNOSIS — H0289 Other specified disorders of eyelid: Secondary | ICD-10-CM | POA: Diagnosis not present

## 2017-03-10 ENCOUNTER — Telehealth: Payer: Self-pay | Admitting: Internal Medicine

## 2017-03-10 NOTE — Telephone Encounter (Signed)
Pt is medicare, will need AWV w/ Glenard Haring if not already done, and ROV w/ Paz. CPE can not be billed.

## 2017-03-10 NOTE — Telephone Encounter (Signed)
Called to reschedule pt's apt due to PCP being out of the office. Pt would like to have a morning apt. Due to change in schedule unable to schedule pt sooner than later. Pt says that he would like to have CPE before the end of the year. Please advise for scheduling.

## 2017-03-12 NOTE — Telephone Encounter (Signed)
Pt has been scheduled.  °

## 2017-03-17 NOTE — Progress Notes (Deleted)
Subjective:   Sean Riley is a 66 y.o. male who presents for Medicare Annual/Subsequent preventive examination.  Review of Systems:  No ROS.  Medicare Wellness Visit. Additional risk factors are reflected in the social history.    Sleep patterns:  Home Safety/Smoke Alarms: Feels safe in home. Smoke alarms in place.  Living environment; residence and Firearm Safety:  [place] Safety/Bike Helmet: Wears seat belt.   Counseling:   Eye Exam-  Dental-  Male:   CCS- Last 05/14/16: Some of the polyps removed were pre-cancerous. Recall 3 yrs.  PSA-  Lab Results  Component Value Date   PSA 7.18 (H) 01/10/2016   PSA 0.53 05/10/2014   PSA 0.46 04/24/2013       Objective:    Vitals: There were no vitals taken for this visit.  There is no height or weight on file to calculate BMI.  Tobacco History  Smoking Status  . Current Every Day Smoker  . Packs/day: 1.50  . Years: 30.00  . Types: Cigarettes  Smokeless Tobacco  . Never Used    Comment: 1 ppd      Ready to quit: Not Answered Counseling given: Not Answered   Past Medical History:  Diagnosis Date  . Allergy   . Arthritis   . Atrial fibrillation ([place])   . Cataract    removed both eyes   . GERD (gastroesophageal reflux disease)   . HYPERLIPIDEMIA   . 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 Hypertension   . Microscopic hematuria    negative cystoscopy  . NONSPECIFIC ABNORMAL ELECTROCARDIOGRAM   . Varicose veins    Past Surgical History:  Procedure Laterality Date  . APPENDECTOMY    . CARDIAC CATHETERIZATION      X3;Dr Caryl Comes, last in July, 2013  . COLONOSCOPY    . CORONARY ANGIOPLASTY     04/24/13 : he denies angioplasty  . CYSTOSCOPY  1978   Dr Hartley Barefoot  . KNEE ARTHROSCOPY  2012   Dr Theda Sers  . LUMBAR LAMINECTOMY  1987   Dr Durward Fortes  . MYELOGRAM  2007  . POLYPECTOMY    . ROTATOR CUFF REPAIR    . SHOULDER ARTHROSCOPY  2011   Dr Theda Sers  . SHOULDER ARTHROSCOPY Right 08/2015  .  SPINAL FUSION  1986   Dr Rolin Barry  . TRACHEOSTOMY     age 2 for croup   Family History  Problem Relation Age of Onset  . Hypertension Mother   . Hypertension Father   . Benign prostatic hyperplasia Father        S/P TURP  . Heart attack Maternal Grandmother        MI in 45s  . Breast cancer Maternal Grandmother   . Arrhythmia Brother         X 2  . Heart attack Maternal Aunt        MI in 21s  . Stroke Neg Hx   . Diabetes Neg Hx   . Colon cancer Neg Hx   . Colon polyps Neg Hx   . Stomach cancer Neg Hx   . Rectal cancer Neg Hx   . Esophageal cancer Neg Hx   . Prostate cancer Neg Hx    History  Sexual Activity  . Sexual activity: Not on file    Outpatient Encounter Prescriptions as of 03/29/2017  Medication Sig  . albuterol (PROAIR HFA) 108 (90 Base) MCG/ACT inhaler Inhale two puffs every four to six hours as  needed for cough or wheeze.  Marland Kitchen amLODipine (NORVASC) 5 MG tablet Take 1 tablet (5 mg total) by mouth daily.  Marland Kitchen azelastine (ASTELIN) 0.1 % nasal spray Can use two sprays in each nostril twice daily as directed.  . betamethasone dipropionate (DIPROLENE) 0.05 % ointment   . calcipotriene-betamethasone (TACLONEX) ointment Apply 1 application topically daily.  Marland Kitchen esomeprazole (NEXIUM) 40 MG capsule Take 1 capsule (40 mg total) by mouth daily before breakfast.  . flecainide (TAMBOCOR) 100 MG tablet Take 1 tablet (100 mg total) by mouth 2 (two) times daily.  . fluticasone (FLONASE) 50 MCG/ACT nasal spray Use one spray in each nostril twice daily.  Marland Kitchen ipratropium (ATROVENT) 0.06 % nasal spray Can use two sprays in each nostril every six hours as needed to dry up nose.  . rosuvastatin (CRESTOR) 5 MG tablet Take 5 mg by mouth every other day.  . sildenafil (VIAGRA) 100 MG tablet Take 0.5-1 tablets (50-100 mg total) by mouth daily as needed for erectile dysfunction.  . triamcinolone cream (KENALOG) 0.1 %    Facility-Administered Encounter Medications as of 03/29/2017  Medication  .  0.9 %  sodium chloride infusion    Activities of Daily Living No flowsheet data found.  Patient Care Team: Colon Branch, MD as PCP - General (Internal Medicine) Deboraha Sprang, MD as Consulting Physician (Cardiology) Deneise Lever, MD as Consulting Physician (Pulmonary Disease) Renato Shin, MD as Consulting Physician (Endocrinology) Darleen Crocker, MD as Consulting Physician (Ophthalmology) Loletha Carrow Kirke Corin, MD as Consulting Physician (Gastroenterology)   Assessment:     Physical assessment deferred to PCP.  Exercise Activities and Dietary recommendations   Diet (meal preparation, eat out, water intake, caffeinated beverages, dairy products, fruits and vegetables): {Desc; diets:16563} Breakfast: Lunch:  Dinner:      Goals    None     Fall Risk Fall Risk  03/12/2016 03/11/2015 04/24/2013  Falls in the past year? No No No   Depression Screen PHQ 2/9 Scores 03/12/2016 03/11/2015 04/24/2013  PHQ - 2 Score 0 0 0    Cognitive Function        Immunization History  Administered Date(s) Administered  . Influenza, High Dose Seasonal PF 06/05/2015  . Influenza,inj,Quad PF,36+ Mos 04/24/2013, 05/10/2014  . Influenza-Unspecified 06/10/2016  . Pneumococcal Conjugate-13 05/10/2014  . Pneumococcal Polysaccharide-23 10/16/2013  . Td 07/09/2006, 02/19/2008  . Zoster 08/10/2014   Screening Tests Health Maintenance  Topic Date Due  . INFLUENZA VACCINE  03/10/2017  . TETANUS/TDAP  02/18/2018  . PNA vac Low Risk Adult (2 of 2 - PPSV23) 10/17/2018  . COLONOSCOPY  05/15/2019  . Hepatitis C Screening  Completed      Plan:   ***  I have personally reviewed and noted the following in the patient's chart:   . Medical and social history . Use of alcohol, tobacco or illicit drugs  . Current medications and supplements . Functional ability and status . Nutritional status . Physical activity . Advanced directives . List of other physicians . Hospitalizations, surgeries, and ER  visits in previous 12 months . Vitals . Screenings to include cognitive, depression, and falls . Referrals and appointments  In addition, I have reviewed and discussed with patient certain preventive protocols, quality metrics, and best practice recommendations. A written personalized care plan for preventive services as well as general preventive health recommendations were provided to patient.     Shela Nevin, [place]  03/17/2017

## 2017-03-22 ENCOUNTER — Ambulatory Visit (INDEPENDENT_AMBULATORY_CARE_PROVIDER_SITE_OTHER)
Admission: RE | Admit: 2017-03-22 | Discharge: 2017-03-22 | Disposition: A | Discharge status: Home / Self Care | Payer: Medicare Other | Source: Ambulatory Visit | Attending: Acute Care | Admitting: Acute Care

## 2017-03-22 DIAGNOSIS — F17210 Nicotine dependence, cigarettes, uncomplicated: Principal | ICD-10-CM

## 2017-03-22 DIAGNOSIS — Z87891 Personal history of nicotine dependence: Secondary | ICD-10-CM

## 2017-03-23 ENCOUNTER — Emergency Department (HOSPITAL_COMMUNITY): Payer: Medicare Other

## 2017-03-23 ENCOUNTER — Emergency Department (HOSPITAL_COMMUNITY): Payer: Medicare Other | Admitting: Anesthesiology

## 2017-03-23 ENCOUNTER — Inpatient Hospital Stay (HOSPITAL_COMMUNITY)
Admission: EM | Admit: 2017-03-23 | Discharge: 2017-04-09 | DRG: 219 | Disposition: A | Discharge status: Home Health | Payer: Medicare Other | Attending: Thoracic Surgery (Cardiothoracic Vascular Surgery) | Admitting: Thoracic Surgery (Cardiothoracic Vascular Surgery)

## 2017-03-23 ENCOUNTER — Encounter (HOSPITAL_COMMUNITY)
Admission: EM | Disposition: A | Discharge status: Home Health | Payer: Self-pay | Source: Home / Self Care | Attending: Thoracic Surgery (Cardiothoracic Vascular Surgery)

## 2017-03-23 ENCOUNTER — Encounter (HOSPITAL_COMMUNITY): Payer: Self-pay

## 2017-03-23 DIAGNOSIS — E785 Hyperlipidemia, unspecified: Secondary | ICD-10-CM | POA: Diagnosis not present

## 2017-03-23 DIAGNOSIS — I313 Pericardial effusion (noninflammatory): Secondary | ICD-10-CM | POA: Diagnosis not present

## 2017-03-23 DIAGNOSIS — Z9689 Presence of other specified functional implants: Secondary | ICD-10-CM

## 2017-03-23 DIAGNOSIS — E8770 Fluid overload, unspecified: Secondary | ICD-10-CM | POA: Diagnosis not present

## 2017-03-23 DIAGNOSIS — R079 Chest pain, unspecified: Secondary | ICD-10-CM | POA: Diagnosis not present

## 2017-03-23 DIAGNOSIS — I509 Heart failure, unspecified: Secondary | ICD-10-CM

## 2017-03-23 DIAGNOSIS — I10 Essential (primary) hypertension: Secondary | ICD-10-CM | POA: Diagnosis not present

## 2017-03-23 DIAGNOSIS — Z953 Presence of xenogenic heart valve: Secondary | ICD-10-CM

## 2017-03-23 DIAGNOSIS — Z9889 Other specified postprocedural states: Secondary | ICD-10-CM

## 2017-03-23 DIAGNOSIS — K219 Gastro-esophageal reflux disease without esophagitis: Secondary | ICD-10-CM | POA: Diagnosis not present

## 2017-03-23 DIAGNOSIS — N170 Acute kidney failure with tubular necrosis: Secondary | ICD-10-CM | POA: Diagnosis not present

## 2017-03-23 DIAGNOSIS — D6959 Other secondary thrombocytopenia: Secondary | ICD-10-CM | POA: Diagnosis not present

## 2017-03-23 DIAGNOSIS — F17200 Nicotine dependence, unspecified, uncomplicated: Secondary | ICD-10-CM | POA: Diagnosis present

## 2017-03-23 DIAGNOSIS — I358 Other nonrheumatic aortic valve disorders: Secondary | ICD-10-CM | POA: Diagnosis not present

## 2017-03-23 DIAGNOSIS — I7101 Dissection of ascending aorta: Secondary | ICD-10-CM

## 2017-03-23 DIAGNOSIS — I4510 Unspecified right bundle-branch block: Secondary | ICD-10-CM | POA: Diagnosis present

## 2017-03-23 DIAGNOSIS — Z885 Allergy status to narcotic agent status: Secondary | ICD-10-CM

## 2017-03-23 DIAGNOSIS — Z91040 Latex allergy status: Secondary | ICD-10-CM

## 2017-03-23 DIAGNOSIS — Z8249 Family history of ischemic heart disease and other diseases of the circulatory system: Secondary | ICD-10-CM

## 2017-03-23 DIAGNOSIS — E876 Hypokalemia: Secondary | ICD-10-CM | POA: Diagnosis not present

## 2017-03-23 DIAGNOSIS — I480 Paroxysmal atrial fibrillation: Secondary | ICD-10-CM | POA: Diagnosis present

## 2017-03-23 DIAGNOSIS — Z79899 Other long term (current) drug therapy: Secondary | ICD-10-CM

## 2017-03-23 DIAGNOSIS — K5900 Constipation, unspecified: Secondary | ICD-10-CM | POA: Diagnosis not present

## 2017-03-23 DIAGNOSIS — J939 Pneumothorax, unspecified: Secondary | ICD-10-CM

## 2017-03-23 DIAGNOSIS — R069 Unspecified abnormalities of breathing: Secondary | ICD-10-CM | POA: Diagnosis not present

## 2017-03-23 DIAGNOSIS — K8020 Calculus of gallbladder without cholecystitis without obstruction: Secondary | ICD-10-CM | POA: Diagnosis present

## 2017-03-23 DIAGNOSIS — I3139 Other pericardial effusion (noninflammatory): Secondary | ICD-10-CM

## 2017-03-23 DIAGNOSIS — E871 Hypo-osmolality and hyponatremia: Secondary | ICD-10-CM | POA: Diagnosis not present

## 2017-03-23 DIAGNOSIS — J449 Chronic obstructive pulmonary disease, unspecified: Secondary | ICD-10-CM | POA: Diagnosis present

## 2017-03-23 DIAGNOSIS — R0789 Other chest pain: Secondary | ICD-10-CM | POA: Diagnosis not present

## 2017-03-23 DIAGNOSIS — I4891 Unspecified atrial fibrillation: Secondary | ICD-10-CM | POA: Diagnosis not present

## 2017-03-23 DIAGNOSIS — Z981 Arthrodesis status: Secondary | ICD-10-CM

## 2017-03-23 DIAGNOSIS — D62 Acute posthemorrhagic anemia: Secondary | ICD-10-CM | POA: Diagnosis not present

## 2017-03-23 DIAGNOSIS — J9811 Atelectasis: Secondary | ICD-10-CM | POA: Diagnosis not present

## 2017-03-23 DIAGNOSIS — I71019 Dissection of thoracic aorta, unspecified: Secondary | ICD-10-CM

## 2017-03-23 DIAGNOSIS — M1990 Unspecified osteoarthritis, unspecified site: Secondary | ICD-10-CM | POA: Diagnosis present

## 2017-03-23 DIAGNOSIS — F17210 Nicotine dependence, cigarettes, uncomplicated: Secondary | ICD-10-CM | POA: Diagnosis present

## 2017-03-23 DIAGNOSIS — R0602 Shortness of breath: Secondary | ICD-10-CM

## 2017-03-23 DIAGNOSIS — J309 Allergic rhinitis, unspecified: Secondary | ICD-10-CM | POA: Diagnosis present

## 2017-03-23 DIAGNOSIS — I7100 Dissection of unspecified site of aorta: Secondary | ICD-10-CM | POA: Diagnosis not present

## 2017-03-23 DIAGNOSIS — J189 Pneumonia, unspecified organism: Secondary | ICD-10-CM

## 2017-03-23 DIAGNOSIS — R410 Disorientation, unspecified: Secondary | ICD-10-CM | POA: Diagnosis not present

## 2017-03-23 DIAGNOSIS — I712 Thoracic aortic aneurysm, without rupture: Secondary | ICD-10-CM | POA: Diagnosis not present

## 2017-03-23 DIAGNOSIS — Z888 Allergy status to other drugs, medicaments and biological substances status: Secondary | ICD-10-CM

## 2017-03-23 HISTORY — DX: Other specified postprocedural states: Z98.890

## 2017-03-23 HISTORY — DX: Dissection of ascending aorta: I71.010

## 2017-03-23 HISTORY — DX: Presence of xenogenic heart valve: Z95.3

## 2017-03-23 HISTORY — DX: Dissection of thoracic aorta: I71.01

## 2017-03-23 HISTORY — PX: REPAIR OF ACUTE ASCENDING THORACIC AORTIC DISSECTION: SHX6323

## 2017-03-23 HISTORY — PX: TEE WITHOUT CARDIOVERSION: SHX5443

## 2017-03-23 LAB — CBC WITH DIFFERENTIAL/PLATELET
Basophils Absolute: 0 10*3/uL (ref 0.0–0.1)
Basophils Relative: 0 %
EOS ABS: 0 10*3/uL (ref 0.0–0.7)
EOS PCT: 0 %
HCT: 40.5 % (ref 39.0–52.0)
Hemoglobin: 13.5 g/dL (ref 13.0–17.0)
LYMPHS ABS: 0.9 10*3/uL (ref 0.7–4.0)
Lymphocytes Relative: 6 %
MCH: 29.2 pg (ref 26.0–34.0)
MCHC: 33.3 g/dL (ref 30.0–36.0)
MCV: 87.7 fL (ref 78.0–100.0)
MONO ABS: 0.8 10*3/uL (ref 0.1–1.0)
Monocytes Relative: 5 %
Neutro Abs: 13.5 10*3/uL — ABNORMAL HIGH (ref 1.7–7.7)
Neutrophils Relative %: 89 %
PLATELETS: 181 10*3/uL (ref 150–400)
RBC: 4.62 MIL/uL (ref 4.22–5.81)
RDW: 14.5 % (ref 11.5–15.5)
WBC: 15.3 10*3/uL — ABNORMAL HIGH (ref 4.0–10.5)

## 2017-03-23 LAB — BASIC METABOLIC PANEL
Anion gap: 9 (ref 5–15)
Anion gap: 9 (ref 5–15)
BUN: 8 mg/dL (ref 6–20)
BUN: 10 mg/dL (ref 6–20)
CALCIUM: 8.5 mg/dL — AB (ref 8.9–10.3)
CHLORIDE: 99 mmol/L — AB (ref 101–111)
CHLORIDE: 97 mmol/L — AB (ref 101–111)
CO2: 23 mmol/L (ref 22–32)
CO2: 23 mmol/L (ref 22–32)
CREATININE: 1.2 mg/dL (ref 0.61–1.24)
Calcium: 8.2 mg/dL — ABNORMAL LOW (ref 8.9–10.3)
Creatinine, Ser: 1.25 mg/dL — ABNORMAL HIGH (ref 0.61–1.24)
GFR calc Af Amer: >60 mL/min (ref >60)
GFR calc non Af Amer: >60 mL/min (ref >60)
GFR calc non Af Amer: 58 mL/min — ABNORMAL LOW (ref >60)
GLUCOSE: 112 mg/dL — AB (ref 65–99)
Glucose, Bld: 119 mg/dL — ABNORMAL HIGH (ref 65–99)
POTASSIUM: 4.3 mmol/L (ref 3.5–5.1)
Potassium: 4 mmol/L (ref 3.5–5.1)
SODIUM: 129 mmol/L — AB (ref 135–145)
Sodium: 131 mmol/L — ABNORMAL LOW (ref 135–145)

## 2017-03-23 LAB — CBC
HCT: 42.1 % (ref 39.0–52.0)
Hemoglobin: 14.4 g/dL (ref 13.0–17.0)
MCH: 29.5 pg (ref 26.0–34.0)
MCHC: 34.2 g/dL (ref 30.0–36.0)
MCV: 86.3 fL (ref 78.0–100.0)
PLATELETS: 198 10*3/uL (ref 150–400)
RBC: 4.88 MIL/uL (ref 4.22–5.81)
RDW: 14.2 % (ref 11.5–15.5)
WBC: 12.4 10*3/uL — ABNORMAL HIGH (ref 4.0–10.5)

## 2017-03-23 LAB — FIBRINOGEN: FIBRINOGEN: 252 mg/dL (ref 210–475)

## 2017-03-23 LAB — ABO/RH: ABO/RH(D): O NEG

## 2017-03-23 LAB — I-STAT TROPONIN, ED: TROPONIN I, POC: 0 ng/mL (ref 0.00–0.08)

## 2017-03-23 LAB — PREPARE RBC (CROSSMATCH)

## 2017-03-23 LAB — PROTIME-INR
INR: 1.21
PROTHROMBIN TIME: 15.4 s — AB (ref 11.4–15.2)

## 2017-03-23 LAB — APTT: APTT: 39 s — AB (ref 24–36)

## 2017-03-23 SURGERY — REPAIR, AORTIC DISSECTION, ASCENDING
Anesthesia: General

## 2017-03-23 MED ORDER — ETOMIDATE 2 MG/ML IV SOLN
INTRAVENOUS | Status: AC
  Filled 2017-03-23: qty 10

## 2017-03-23 MED ORDER — DEXMEDETOMIDINE HCL IN NACL 400 MCG/100ML IV SOLN
0.1000 ug/kg/h | INTRAVENOUS | Status: AC
  Administered 2017-03-23: .2 ug/kg/h via INTRAVENOUS
  Filled 2017-03-23 (×2): qty 100

## 2017-03-23 MED ORDER — DOPAMINE-DEXTROSE 3.2-5 MG/ML-% IV SOLN
0.0000 ug/kg/min | INTRAVENOUS | Status: AC
  Administered 2017-03-24: 3 ug/kg/min via INTRAVENOUS

## 2017-03-23 MED ORDER — SODIUM CHLORIDE 0.9 % IV SOLN
30.0000 ug/min | INTRAVENOUS | Status: DC
  Filled 2017-03-23: qty 2

## 2017-03-23 MED ORDER — TRANEXAMIC ACID (OHS) PUMP PRIME SOLUTION
2.0000 mg/kg | INTRAVENOUS | Status: DC
Start: 2017-03-23 — End: 2017-03-24
  Filled 2017-03-23: qty 1.75

## 2017-03-23 MED ORDER — TRANEXAMIC ACID 1000 MG/10ML IV SOLN
1.5000 mg/kg/h | INTRAVENOUS | Status: AC
  Administered 2017-03-23: 1.5 mg/kg/h via INTRAVENOUS
  Filled 2017-03-23 (×2): qty 25

## 2017-03-23 MED ORDER — FENTANYL CITRATE (PF) 250 MCG/5ML IJ SOLN
INTRAMUSCULAR | Status: AC
  Filled 2017-03-23: qty 5

## 2017-03-23 MED ORDER — PROTAMINE SULFATE 10 MG/ML IV SOLN
INTRAVENOUS | Status: AC
Start: 2017-03-23 — End: ?
  Filled 2017-03-23: qty 25

## 2017-03-23 MED ORDER — FENTANYL CITRATE (PF) 250 MCG/5ML IJ SOLN
INTRAMUSCULAR | Status: AC
  Filled 2017-03-23: qty 20

## 2017-03-23 MED ORDER — PROPOFOL 10 MG/ML IV BOLUS
INTRAVENOUS | Status: DC | PRN
  Administered 2017-03-23: 150 mg via INTRAVENOUS

## 2017-03-23 MED ORDER — HYDROCORTISONE NA SUCCINATE PF 100 MG IJ SOLR
INTRAMUSCULAR | Status: DC | PRN
  Administered 2017-03-23: 250 mg via INTRAVENOUS

## 2017-03-23 MED ORDER — LACTATED RINGERS IV SOLN
INTRAVENOUS | Status: DC | PRN
  Administered 2017-03-23 (×2): via INTRAVENOUS

## 2017-03-23 MED ORDER — KENNESTONE BLOOD CARDIOPLEGIA VIAL
13.0000 mL | Freq: Once | Status: DC
  Filled 2017-03-23: qty 1

## 2017-03-23 MED ORDER — MAGNESIUM SULFATE 50 % IJ SOLN
40.0000 meq | INTRAMUSCULAR | Status: DC
  Filled 2017-03-23: qty 10

## 2017-03-23 MED ORDER — NITROGLYCERIN IN D5W 200-5 MCG/ML-% IV SOLN: 2.0000 ug/min | INTRAVENOUS | Status: DC

## 2017-03-23 MED ORDER — SODIUM CHLORIDE 0.9 % IV SOLN
INTRAVENOUS | Status: DC
  Filled 2017-03-23: qty 30

## 2017-03-23 MED ORDER — KENNESTONE BLOOD CARDIOPLEGIA (KBC) MANNITOL SYRINGE (20%, 32ML)
32.0000 mL | Freq: Once | INTRAVENOUS | Status: DC
  Filled 2017-03-23: qty 1

## 2017-03-23 MED ORDER — ROCURONIUM BROMIDE 100 MG/10ML IV SOLN
INTRAVENOUS | Status: DC | PRN
  Administered 2017-03-23 – 2017-03-24 (×4): 50 mg via INTRAVENOUS
  Administered 2017-03-24: 100 mg via INTRAVENOUS

## 2017-03-23 MED ORDER — PHENYLEPHRINE 40 MCG/ML (10ML) SYRINGE FOR IV PUSH (FOR BLOOD PRESSURE SUPPORT)
PREFILLED_SYRINGE | INTRAVENOUS | Status: AC
  Filled 2017-03-23: qty 10

## 2017-03-23 MED ORDER — LACTATED RINGERS IV SOLN
INTRAVENOUS | Status: DC | PRN
  Administered 2017-03-23: 18:00:00 via INTRAVENOUS

## 2017-03-23 MED ORDER — IOPAMIDOL (ISOVUE-370) INJECTION 76%
INTRAVENOUS | Status: AC
  Administered 2017-03-23: 100 mL
  Filled 2017-03-23: qty 100

## 2017-03-23 MED ORDER — MIDAZOLAM HCL 10 MG/2ML IJ SOLN
INTRAMUSCULAR | Status: AC
  Filled 2017-03-23: qty 2

## 2017-03-23 MED ORDER — PROPOFOL 10 MG/ML IV BOLUS
INTRAVENOUS | Status: AC
  Filled 2017-03-23: qty 20

## 2017-03-23 MED ORDER — ROCURONIUM BROMIDE 10 MG/ML (PF) SYRINGE
PREFILLED_SYRINGE | INTRAVENOUS | Status: AC
Start: 2017-03-23 — End: ?
  Filled 2017-03-23: qty 10

## 2017-03-23 MED ORDER — HEPARIN SODIUM (PORCINE) 1000 UNIT/ML IJ SOLN
INTRAMUSCULAR | Status: DC | PRN
  Administered 2017-03-23: 30000 [IU] via INTRAVENOUS

## 2017-03-23 MED ORDER — ETOMIDATE 2 MG/ML IV SOLN
INTRAVENOUS | Status: DC | PRN
  Administered 2017-03-23: 20 mg via INTRAVENOUS

## 2017-03-23 MED ORDER — NITROGLYCERIN 0.4 MG SL SUBL
0.4000 mg | SUBLINGUAL_TABLET | SUBLINGUAL | Status: DC | PRN
  Filled 2017-03-23: qty 1

## 2017-03-23 MED ORDER — SUCCINYLCHOLINE CHLORIDE 20 MG/ML IJ SOLN
INTRAMUSCULAR | Status: DC | PRN
  Administered 2017-03-23: 120 mg via INTRAVENOUS

## 2017-03-23 MED ORDER — PLASMA-LYTE 148 IV SOLN
INTRAVENOUS | Status: DC
  Filled 2017-03-23: qty 2.5

## 2017-03-23 MED ORDER — 0.9 % SODIUM CHLORIDE (POUR BTL) OPTIME
TOPICAL | Status: DC | PRN
  Administered 2017-03-23: 5000 mL

## 2017-03-23 MED ORDER — ESMOLOL HCL-SODIUM CHLORIDE 2000 MG/100ML IV SOLN
25.0000 ug/kg/min | Freq: Once | INTRAVENOUS | Status: DC
  Filled 2017-03-23: qty 100

## 2017-03-23 MED ORDER — ESMOLOL HCL 100 MG/10ML IV SOLN
INTRAVENOUS | Status: AC
  Filled 2017-03-23: qty 10

## 2017-03-23 MED ORDER — VANCOMYCIN HCL 1000 MG IV SOLR
INTRAVENOUS | Status: AC
  Administered 2017-03-24: 01:00:00
  Filled 2017-03-23: qty 1000

## 2017-03-23 MED ORDER — SODIUM CHLORIDE 0.9 % IV SOLN
INTRAVENOUS | Status: AC
  Administered 2017-03-23: 1.4 [IU]/h via INTRAVENOUS
  Filled 2017-03-23: qty 1

## 2017-03-23 MED ORDER — EPINEPHRINE PF 1 MG/ML IJ SOLN
0.0000 ug/min | INTRAVENOUS | Status: AC
  Administered 2017-03-24: 5 ug/min via INTRAVENOUS
  Filled 2017-03-23: qty 4

## 2017-03-23 MED ORDER — PROTAMINE SULFATE 10 MG/ML IV SOLN
INTRAVENOUS | Status: AC
  Filled 2017-03-23: qty 5

## 2017-03-23 MED ORDER — SUCCINYLCHOLINE CHLORIDE 200 MG/10ML IV SOSY
PREFILLED_SYRINGE | INTRAVENOUS | Status: AC
  Filled 2017-03-23: qty 10

## 2017-03-23 MED ORDER — FENTANYL CITRATE (PF) 100 MCG/2ML IJ SOLN
100.0000 ug | Freq: Once | INTRAMUSCULAR | Status: AC
  Administered 2017-03-23: 100 ug via INTRAVENOUS
  Filled 2017-03-23: qty 2

## 2017-03-23 MED ORDER — FENTANYL CITRATE (PF) 250 MCG/5ML IJ SOLN
INTRAMUSCULAR | Status: AC
  Filled 2017-03-23: qty 10

## 2017-03-23 MED ORDER — FENTANYL CITRATE (PF) 100 MCG/2ML IJ SOLN
INTRAMUSCULAR | Status: DC | PRN
  Administered 2017-03-23: 500 ug via INTRAVENOUS
  Administered 2017-03-23 (×4): 250 ug via INTRAVENOUS
  Administered 2017-03-24: 100 ug via INTRAVENOUS
  Administered 2017-03-24: 150 ug via INTRAVENOUS

## 2017-03-23 MED ORDER — SODIUM CHLORIDE 0.9 % IV SOLN: Freq: Once | INTRAVENOUS | Status: DC

## 2017-03-23 MED ORDER — PHENYLEPHRINE HCL 10 MG/ML IJ SOLN
INTRAVENOUS | Status: DC | PRN
  Administered 2017-03-23: 25 ug/min via INTRAVENOUS

## 2017-03-23 MED ORDER — SODIUM CHLORIDE 0.9 % IJ SOLN
OROMUCOSAL | Status: DC | PRN
  Administered 2017-03-23 (×5): 4 mL via TOPICAL

## 2017-03-23 MED ORDER — TRANEXAMIC ACID (OHS) BOLUS VIA INFUSION
15.0000 mg/kg | INTRAVENOUS | Status: AC
  Administered 2017-03-23: 1312.5 mg via INTRAVENOUS
  Filled 2017-03-23: qty 1313

## 2017-03-23 MED ORDER — LEVOFLOXACIN IN D5W 500 MG/100ML IV SOLN
500.0000 mg | INTRAVENOUS | Status: AC
  Administered 2017-03-23: 500 mg via INTRAVENOUS
  Filled 2017-03-23: qty 100

## 2017-03-23 MED ORDER — POTASSIUM CHLORIDE 2 MEQ/ML IV SOLN
80.0000 meq | INTRAVENOUS | Status: DC
Start: 2017-03-23 — End: 2017-03-24
  Filled 2017-03-23: qty 40

## 2017-03-23 MED ORDER — PHENYLEPHRINE HCL 10 MG/ML IJ SOLN
INTRAMUSCULAR | Status: DC | PRN
  Administered 2017-03-23 (×2): 80 ug via INTRAVENOUS

## 2017-03-23 MED ORDER — VANCOMYCIN HCL 10 G IV SOLR
1250.0000 mg | INTRAVENOUS | Status: AC
  Administered 2017-03-23: 1250 mg via INTRAVENOUS
  Filled 2017-03-23: qty 1250

## 2017-03-23 SURGICAL SUPPLY — 122 items
ADAPTER CARDIO PERF ANTE/RETRO (ADAPTER) ×2 IMPLANT
ADH SKN CLS APL DERMABOND .7 (GAUZE/BANDAGES/DRESSINGS) ×1
ADH SRG 12 PREFL SYR 3 SPRDR (MISCELLANEOUS)
ADPR PRFSN 84XANTGRD RTRGD (ADAPTER) ×1
BAG DECANTER FOR FLEXI CONT (MISCELLANEOUS) ×2 IMPLANT
BLADE CLIPPER SURG (BLADE) ×1 IMPLANT
BLADE STERNUM SYSTEM 6 (BLADE) ×2 IMPLANT
BLADE SURG 11 STRL SS (BLADE) ×3 IMPLANT
BLADE SURG 15 STRL LF DISP TIS (BLADE) IMPLANT
BLADE SURG 15 STRL SS (BLADE)
CANISTER SUCT 3000ML PPV (MISCELLANEOUS) ×2 IMPLANT
CANN PRFSN .5XCNCT 15X34-48 (MISCELLANEOUS)
CANNULA ARTERIAL 007325 (MISCELLANEOUS) IMPLANT
CANNULA ARTERIAL 14F 007324 (MISCELLANEOUS) IMPLANT
CANNULA ARTERIAL 18F 007308 (MISCELLANEOUS) IMPLANT
CANNULA ARTERIAL 20F L7309 (MISCELLANEOUS) IMPLANT
CANNULA ARTERIAL 22F 007310 (MISCELLANEOUS) IMPLANT
CANNULA ARTERIAL 24F 007311 (MISCELLANEOUS) IMPLANT
CANNULA GUNDRY RCSP 15FR (MISCELLANEOUS) ×2 IMPLANT
CANNULA PRFSN .5XCNCT 15X34-48 (MISCELLANEOUS) IMPLANT
CANNULA SUMP PERICARDIAL (CANNULA) ×1 IMPLANT
CANNULA VEN 2 STAGE (MISCELLANEOUS)
CATH EMB 6FR 80CM (CATHETERS) ×2 IMPLANT
CATH HEART VENT LEFT (CATHETERS) ×1 IMPLANT
CATH THORACIC 28FR (CATHETERS) IMPLANT
CATH THORACIC 36FR (CATHETERS) ×1 IMPLANT
CATH THORACIC 36FR RT ANG (CATHETERS) IMPLANT
CAUTERY SURG HI TEMP FINE TIP (MISCELLANEOUS) ×1 IMPLANT
CLIP VESOCCLUDE MED 6/CT (CLIP) IMPLANT
CLIP VESOCCLUDE SM WIDE 24/CT (CLIP) IMPLANT
CLIP VESOCCLUDE SM WIDE 6/CT (CLIP) IMPLANT
CONN 1/2X1/2X1/2  BEN (MISCELLANEOUS)
CONN 1/2X1/2X1/2 BEN (MISCELLANEOUS) IMPLANT
CONN 3/8X3/8 GISH STERILE (MISCELLANEOUS) IMPLANT
CONN ST 1/4X3/8  BEN (MISCELLANEOUS) ×1
CONN ST 1/4X3/8 BEN (MISCELLANEOUS) IMPLANT
CONN Y 3/8X3/8X3/8  BEN (MISCELLANEOUS)
CONN Y 3/8X3/8X3/8 BEN (MISCELLANEOUS) IMPLANT
CONT SPEC 4OZ CLIKSEAL STRL BL (MISCELLANEOUS) ×3 IMPLANT
CRADLE DONUT ADULT HEAD (MISCELLANEOUS) IMPLANT
DERMABOND ADVANCED (GAUZE/BANDAGES/DRESSINGS) ×1
DERMABOND ADVANCED .7 DNX12 (GAUZE/BANDAGES/DRESSINGS) IMPLANT
DRAIN CHANNEL 32F RND 10.7 FF (WOUND CARE) ×3 IMPLANT
DRSG AQUACEL AG ADV 3.5X14 (GAUZE/BANDAGES/DRESSINGS) ×1 IMPLANT
DRSG COVADERM 4X14 (GAUZE/BANDAGES/DRESSINGS) ×2 IMPLANT
ELECT REM PT RETURN 9FT ADLT (ELECTROSURGICAL) ×4
ELECTRODE REM PT RTRN 9FT ADLT (ELECTROSURGICAL) ×2 IMPLANT
FELT TEFLON 1X6 (MISCELLANEOUS) ×1 IMPLANT
FELT TEFLON 6X6 (MISCELLANEOUS) ×1 IMPLANT
GAUZE SPONGE 4X4 12PLY STRL (GAUZE/BANDAGES/DRESSINGS) ×4 IMPLANT
GAUZE SPONGE 4X4 12PLY STRL LF (GAUZE/BANDAGES/DRESSINGS) ×1 IMPLANT
GLOVE BIO SURGEON STRL SZ 6 (GLOVE) ×5 IMPLANT
GLOVE BIO SURGEON STRL SZ 6.5 (GLOVE) ×14 IMPLANT
GLOVE BIO SURGEON STRL SZ7 (GLOVE) IMPLANT
GLOVE BIO SURGEON STRL SZ7.5 (GLOVE) IMPLANT
GOWN STRL REUS W/ TWL LRG LVL3 (GOWN DISPOSABLE) ×4 IMPLANT
GOWN STRL REUS W/TWL LRG LVL3 (GOWN DISPOSABLE) ×8
GRAFT BRANCH HEMA 14X10X10X30 (Prosthesis & Implant Heart) ×1 IMPLANT
GRAFT GELWEAVE VALSALVA 28 (Prosthesis & Implant Heart) IMPLANT
GRAFT GELWEAVE VALSALVA 28CM (Prosthesis & Implant Heart) ×2 IMPLANT
GRAFT HEMASHIELD 18X30M (Vascular Products) ×1 IMPLANT
GRAFT HEMASHIELD 20M ST (Vascular Products) IMPLANT
HANDLE STAPLE ENDO GIA SHORT (STAPLE) ×1
HEMOSTAT POWDER SURGIFOAM 1G (HEMOSTASIS) ×9 IMPLANT
INSERT FOGARTY SM (MISCELLANEOUS) ×2 IMPLANT
INSERT FOGARTY XLG (MISCELLANEOUS) ×2 IMPLANT
KIT BASIN OR (CUSTOM PROCEDURE TRAY) ×2 IMPLANT
KIT DILATOR VASC 18G NDL (KITS) ×1 IMPLANT
KIT DRAINAGE VACCUM ASSIST (KITS) ×1 IMPLANT
KIT ROOM TURNOVER OR (KITS) ×2 IMPLANT
KIT SUCTION CATH 14FR (SUCTIONS) ×4 IMPLANT
LEAD PACING MYOCARDI (MISCELLANEOUS) ×2 IMPLANT
LINE VENT (MISCELLANEOUS) ×1 IMPLANT
LOOP VESSEL SUPERMAXI WHITE (MISCELLANEOUS) ×1 IMPLANT
NEEDLE AORTIC AIR ASPIRATING (NEEDLE) IMPLANT
NS IRRIG 1000ML POUR BTL (IV SOLUTION) ×10 IMPLANT
PACK OPEN HEART (CUSTOM PROCEDURE TRAY) ×2 IMPLANT
PAD ARMBOARD 7.5X6 YLW CONV (MISCELLANEOUS) ×4 IMPLANT
PUNCH AORTIC ROTATE 5MM 8IN (MISCELLANEOUS) ×1 IMPLANT
SEALANT SURG COSEAL 8ML (VASCULAR PRODUCTS) ×1 IMPLANT
SET CARDIOPLEGIA MPS 5001102 (MISCELLANEOUS) ×1 IMPLANT
SPONGE LAP 18X18 X RAY DECT (DISPOSABLE) IMPLANT
SPONGE LAP 4X18 X RAY DECT (DISPOSABLE) ×2 IMPLANT
STAPLER ENDO GIA 12 SHRT THIN (STAPLE) IMPLANT
STAPLER ENDO GIA 12MM SHORT (STAPLE) ×1 IMPLANT
STAPLER ENDO GIA 30 3.5 (STAPLE) ×1 IMPLANT
STOPCOCK 4 WAY LG BORE MALE ST (IV SETS) ×2 IMPLANT
SUT ETHIBON 2 0 V 52N 30 (SUTURE) ×3 IMPLANT
SUT MNCRL AB 3-0 PS2 18 (SUTURE) ×5 IMPLANT
SUT PDS AB 1 CTX 36 (SUTURE) ×4 IMPLANT
SUT PROLENE 3 0 RB 1 (SUTURE) IMPLANT
SUT PROLENE 3 0 SH DA (SUTURE) ×4 IMPLANT
SUT PROLENE 3 0 SH1 36 (SUTURE) ×13 IMPLANT
SUT PROLENE 4 0 RB 1 (SUTURE) ×56
SUT PROLENE 4 0 SH DA (SUTURE) IMPLANT
SUT PROLENE 4-0 RB1 .5 CRCL 36 (SUTURE) IMPLANT
SUT PROLENE 5 0 C 1 36 (SUTURE) ×21 IMPLANT
SUT SILK  1 MH (SUTURE) ×2
SUT SILK 1 MH (SUTURE) IMPLANT
SUT SILK 3 0 (SUTURE) ×2
SUT SILK 3-0 18XBRD TIE 12 (SUTURE) IMPLANT
SUT STEEL STERNAL CCS#1 18IN (SUTURE) IMPLANT
SUT STEEL SZ 6 DBL 3X14 BALL (SUTURE) ×3 IMPLANT
SUT VIC AB 1 CT1 18XCR BRD 8 (SUTURE) IMPLANT
SUT VIC AB 1 CT1 8-18 (SUTURE)
SUT VIC AB 1 CTX 27 (SUTURE) IMPLANT
SUT VIC AB 2-0 CT1 27 (SUTURE)
SUT VIC AB 2-0 CT1 TAPERPNT 27 (SUTURE) IMPLANT
SUT VIC AB 2-0 CTX 36 (SUTURE) ×5 IMPLANT
SUT VIC AB 3-0 SH 27 (SUTURE) ×10
SUT VIC AB 3-0 SH 27X BRD (SUTURE) IMPLANT
SUT VIC AB 3-0 SH 8-18 (SUTURE) ×1 IMPLANT
SUT VIC AB 3-0 X1 27 (SUTURE) IMPLANT
SUT VICRYL 4-0 PS2 18IN ABS (SUTURE) IMPLANT
SYR 10ML KIT SKIN ADHESIVE (MISCELLANEOUS) ×1 IMPLANT
SYSTEM SAHARA CHEST DRAIN ATS (WOUND CARE) ×2 IMPLANT
TOWEL OR 17X24 6PK STRL BLUE (TOWEL DISPOSABLE) ×1 IMPLANT
TOWEL OR 17X26 10 PK STRL BLUE (TOWEL DISPOSABLE) ×1 IMPLANT
TRAY CATH LUMEN 1 20CM STRL (SET/KITS/TRAYS/PACK) IMPLANT
VALVE MAGNA EASE AORTIC 25MM (Prosthesis & Implant Heart) ×1 IMPLANT
VENT LEFT HEART 12002 (CATHETERS) ×2
WATER STERILE IRR 1000ML POUR (IV SOLUTION) ×4 IMPLANT

## 2017-03-23 NOTE — Anesthesia Procedure Notes (Signed)
Procedure Name: Intubation Date/Time: 03/23/2017 5:54 PM Performed by: Laretta Alstrom Pre-anesthesia Checklist: Patient identified, Emergency Drugs available, Suction available and Patient being monitored Patient Re-evaluated:Patient Re-evaluated prior to induction Oxygen Delivery Method: Circle System Utilized Preoxygenation: Pre-oxygenation with 100% oxygen Induction Type: IV induction, Rapid sequence and Cricoid Pressure applied Ventilation: Mask ventilation without difficulty Laryngoscope Size: Mac and 4 Grade View: Grade II Tube type: Subglottic suction tube Tube size: 8.0 mm Number of attempts: 1 Airway Equipment and Method: Stylet Placement Confirmation: ETT inserted through vocal cords under direct vision,  positive ETCO2 and breath sounds checked- equal and bilateral Secured at: 24 cm Tube secured with: Tape Dental Injury: Teeth and Oropharynx as per pre-operative assessment

## 2017-03-23 NOTE — ED Provider Notes (Signed)
[facility] DEPT Provider Note   CSN: [id number] Arrival date & time: 03/23/17  1601     History   Chief Complaint Chief Complaint  Patient presents with  . Chest Pain    HPI MANUAL NAVARRA is a 66 y.o. male.  HPI  This is a [age] male with no nature fibrillation on flecainide, hypercholesterolemia and hypertension presents to the emergency department today secondary to acute onset central chest pain that radiates to his back is severe in nature starting about 1 hour ago. Has some nitroglycerin and aspirin in the annulus which apparently helped a little bit but then the pain returned. He has not had any cough or fever recently. With this pain he did have some nausea and source of breath with diaphoresis. He states he's had similar symptoms to this in the past with pneumonia. Her anything else for symptoms.  Past Medical History:  Diagnosis Date  . Allergy   . Arthritis   . Atrial fibrillation ([place])   . Cataract    removed both eyes   . GERD (gastroesophageal reflux disease)   . HYPERLIPIDEMIA   . 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 Hypertension   . Microscopic hematuria    negative cystoscopy  . NONSPECIFIC ABNORMAL ELECTROCARDIOGRAM   . Varicose veins     Patient Active Problem List   Diagnosis Date Noted  . Elevated PSA 01/11/2016  . Medication side effect 01/10/2016  . Screening for prostate cancer 01/10/2016  . PCP NOTES >>>>>>> 07/24/2015  . Gynecomastia, male 10/10/2014  . Allergic rhinitis 09/10/2014  . Dermatitis 05/11/2014  . Annual physical exam 05/10/2014  . Abnormal CXR 05/10/2014  . Erectile dysfunction 05/10/2014  . HTN (hypertension) 10/28/2013  . Bronchospasm 10/28/2013  . ETOH abuse 10/28/2013  . Hyponatremia 10/14/2013  . 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 RBBB 11/14/2008  . Hyperlipidemia 03/09/2008  . tobacco use d/o 03/09/2008  . Atrial fibrillation ([place]) 03/09/2008  . GERD 03/09/2008   . Microscopic hematuria 03/09/2008  . BPH (benign prostatic hyperplasia) 03/09/2008    Past Surgical History:  Procedure Laterality Date  . APPENDECTOMY    . CARDIAC CATHETERIZATION      X3;Dr Caryl Comes, last in July, 2013  . COLONOSCOPY    . CORONARY ANGIOPLASTY     04/24/13 : he denies angioplasty  . CYSTOSCOPY  1978   Dr Hartley Barefoot  . KNEE ARTHROSCOPY  2012   Dr Theda Sers  . LUMBAR LAMINECTOMY  1987   Dr Durward Fortes  . MYELOGRAM  2007  . POLYPECTOMY    . ROTATOR CUFF REPAIR    . SHOULDER ARTHROSCOPY  2011   Dr Theda Sers  . SHOULDER ARTHROSCOPY Right 08/2015  . SPINAL FUSION  1986   Dr Rolin Barry  . TRACHEOSTOMY     age 39 for croup       Home Medications    Prior to Admission medications   Medication Sig Start Date End Date Taking? Authorizing Provider  albuterol (PROAIR HFA) 108 (90 Base) MCG/ACT inhaler Inhale two puffs every four to six hours as needed for cough or wheeze. Patient taking differently: Inhale 2 puffs into the lungs See admin instructions. Every 4-6 hours as needed for coughing or wheezing 01/05/17  Yes Kozlow, Donnamarie Poag, MD  amLODipine (NORVASC) 5 MG tablet Take 1 tablet (5 mg total) by mouth daily. 07/24/15  Yes Paz, Alda Berthold, MD  azelastine (ASTELIN) 0.1 % nasal spray Can use  two sprays in each nostril twice daily as directed. Patient taking differently: Place 2 sprays into both nostrils 2 (two) times daily.  01/05/17  Yes Kozlow, Donnamarie Poag, MD  betamethasone dipropionate (DIPROLENE) 0.05 % ointment Apply 1 application topically at bedtime. APPLY TO BACK AREA 11/30/16  Yes [provider]  calcipotriene-betamethasone (TACLONEX) ointment Apply 1 application topically daily as needed (for eczema).    Yes [provider]  esomeprazole (NEXIUM) 40 MG capsule Take 1 capsule (40 mg total) by mouth daily before breakfast. 01/05/17  Yes Kozlow, Donnamarie Poag, MD  flecainide (TAMBOCOR) 100 MG tablet Take 1 tablet (100 mg total) by mouth 2 (two) times daily. 02/02/17  Yes  Deboraha Sprang, MD  fluticasone [facility]) 50 MCG/ACT nasal spray Use one spray in each nostril twice daily. 01/05/17  Yes Kozlow, Donnamarie Poag, MD  ipratropium (ATROVENT) 0.06 % nasal spray Can use two sprays in each nostril every six hours as needed to dry up nose. Patient taking differently: Place 2 sprays into both nostrils every 6 (six) hours as needed for rhinitis.  12/24/15  Yes Kozlow, Donnamarie Poag, MD  rosuvastatin (CRESTOR) 5 MG tablet Take 5 mg by mouth every other day.   Yes [provider]  triamcinolone cream (KENALOG) 0.1 % Apply 1 application topically See admin instructions. To affected site daily as needed for irritation 12/18/16  Yes [provider]    Family History Family History  Problem Relation Age of Onset  . Hypertension Mother   . Hypertension Father   . Benign prostatic hyperplasia Father        S/P TURP  . Heart attack Maternal Grandmother        MI in 28s  . Breast cancer Maternal Grandmother   . Arrhythmia Brother         X 2  . Heart attack Maternal Aunt        MI in 75s  . Stroke Neg Hx   . Diabetes Neg Hx   . Colon cancer Neg Hx   . Colon polyps Neg Hx   . Stomach cancer Neg Hx   . Rectal cancer Neg Hx   . Esophageal cancer Neg Hx   . Prostate cancer Neg Hx     Social History Social History  Substance Use Topics  . Smoking status: Current Every Day Smoker    Packs/day: 1.50    Years: 30.00    Types: Cigarettes  . Smokeless tobacco: Never Used     Comment: 1 ppd   . Alcohol use 0.0 oz/week     Comment: 6-7 cans of beer daily     Allergies   Simvastatin; Celecoxib; Codeine; Nsaids; Tape; Cephalexin; and Latex   Review of Systems Review of Systems  All other systems reviewed and are negative.    Physical Exam Updated Vital Signs BP (!) 125/58   Pulse (!) 53   Resp 13   Ht 6\' 1"  (1.854 m)   Wt 87.5 kg (193 lb)   SpO2 100%   BMI 25.46 kg/m   Physical Exam  Constitutional: He is oriented to person, place, and time. He  appears well-developed and well-nourished. He appears distressed (2/2 pain).  HENT:  Head: Normocephalic and atraumatic.  Eyes: Conjunctivae and EOM are normal.  Neck: Normal range of motion.  Cardiovascular: Normal rate.   Equal pulses  Pulmonary/Chest: Effort normal. Tachypnea noted. No respiratory distress. He has no wheezes. He has no rales.  Abdominal: Soft. He exhibits no distension.  Musculoskeletal: Normal range of motion. He exhibits no edema or deformity.  Neurological: He is alert and oriented to person, place, and time. No cranial nerve deficit. Coordination normal.  Skin: Skin is warm and dry.  Nursing note and vitals reviewed.    ED Treatments / Results  Labs (all labs ordered are listed, but only abnormal results are displayed) Labs Reviewed  BASIC METABOLIC PANEL - Abnormal; Notable for the following:       Result Value   Sodium 131 (*)    Chloride 99 (*)    Glucose, Bld 119 (*)    Calcium 8.5 (*)    All other components within normal limits  CBC - Abnormal; Notable for the following:    WBC 12.4 (*)    All other components within normal limits  I-STAT TROPONIN, ED    EKG  EKG Interpretation  Date/Time:  Tuesday March 23 2017 16:02:18 EDT Ventricular Rate:  81 PR Interval:    QRS Duration: 136 QT Interval:  445 QTC Calculation: 517 R Axis:   -70 Text Interpretation:  Sinus rhythm RBBB and LAFB LAFB appears new Confirmed by Merrily Pew [phone]) on 03/23/2017 4:19:46 PM       Radiology Dg Chest 2 View  Result Date: 03/23/2017 CLINICAL DATA:  Severe upper back and chest pain associated with shortness of breath. History of atrial fibrillation, hypertension, and ascending aortic aneurysm. Current smoker. EXAM: CHEST  2 VIEW COMPARISON:  CT scan chest of March 22, 2017 FINDINGS: The lungs are adequately inflated. The interstitial markings are coarse. There is no alveolar infiltrate or pleural effusion. The heart and pulmonary vascularity are normal.  There is calcification in the wall of the aortic arch and tortuosity of the ascending and descending thoracic aorta. The bony thorax exhibits no acute abnormality. IMPRESSION: Chronic bronchitic-smoking related changes. No pneumonia, pulmonary edema nor other acute cardiopulmonary abnormality. Thoracic aortic atherosclerosis. The known ascending thoracic aortic aneurysm is not clearly evident on this plain radiograph. The mediastinum does not appear abnormally widened. Electronically Signed   By: David  Martinique M.D.   On: 03/23/2017 16:41   Ct Chest Lung Cancer Screening Low Dose Wo Contrast  Result Date: 03/22/2017 CLINICAL DATA:  Current smoker, 57 pack-year history, lung cancer screening. EXAM: CT CHEST WITHOUT CONTRAST LOW-DOSE FOR LUNG CANCER SCREENING TECHNIQUE: Multidetector CT imaging of the chest was performed following the standard protocol without IV contrast. COMPARISON:  03/19/2016. FINDINGS: Cardiovascular: Ascending aorta measures 4.2 cm. Atherosclerotic calcification of the arterial vasculature, including three-vessel involvement of the coronary arteries. Heart size normal. No pericardial effusion. Mediastinum/Nodes: Mediastinal lymph nodes are not enlarged by CT size criteria. Hilar regions are difficult to definitively evaluate without IV contrast but appear grossly unremarkable. No axillary adenopathy. Esophagus is grossly unremarkable. Lungs/Pleura: Moderate centrilobular emphysema. Noncalcified left upper lobe nodule measures 3.6 mm, stable. No pleural fluid. Airway is unremarkable. Upper Abdomen: Visualized portions of the liver, adrenal glands, right kidney, spleen, pancreas and stomach are grossly unremarkable. Musculoskeletal: Degenerative changes in the spine. IMPRESSION: 1. Lung-RADS 2, benign appearance or behavior. Continue annual screening with low-dose chest CT without contrast in 12 months. 2. Aortic atherosclerosis (ICD10-170.0). Three-vessel coronary artery calcification. 3.  Aortic aneurysm NOS (ICD10-I71.9). Ascending aortic aneurysm. Recommend annual imaging followup by CTA or MRA. This recommendation follows 2010 ACCF/AHA/AATS/ACR/ASA/SCA/SCAI/SIR/STS/SVM Guidelines for the Diagnosis and Management of Patients with Thoracic Aortic Disease. Circulation. 2010; 121: H062-B762. 4.  Emphysema (ICD10-J43.9). Electronically Signed   By: Lorin Picket M.D.   On: 03/22/2017  12:15   Ct Angio Chest/abd/pel For Dissection W And/or Wo Contrast  Result Date: 03/23/2017 CLINICAL DATA:  Acute onset central chest pain radiating to the back. EXAM: CT ANGIOGRAPHY CHEST, ABDOMEN AND PELVIS TECHNIQUE: Multidetector CT imaging through the chest, abdomen and pelvis was performed using the standard protocol during bolus administration of intravenous contrast. Multiplanar reconstructed images and MIPs were obtained and reviewed to evaluate the vascular anatomy. CONTRAST:  100 cc Isovue 370 IV. COMPARISON:  Chest radiograph from earlier today. 03/22/2017 chest CT. FINDINGS: CTA CHEST FINDINGS Cardiovascular: Top-normal heart size, increased from prior chest CT. No significant pericardial fluid/thickening. 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 There is possible extension of the dissection flap into the origin of the left main and right coronary arteries. There is extension of the dissection flap into the innominate artery and bilateral common carotid arteries, noting significant attenuation of the true lumens in the common carotid arteries bilaterally (series 5/image 2). Subclavian and vertebral arteries remain patent bilaterally without involvement by the dissection. There is significant attenuation of the true lumen in the ascending aorta. Atherosclerotic mildly tortuous thoracic aorta. Ectatic ascending thoracic aorta, maximum diameter 4.3 cm. Normal caliber pulmonary arteries. No central pulmonary emboli. Mediastinum/Nodes: No discrete thyroid nodules.  Unremarkable esophagus. No pathologically enlarged axillary, mediastinal or hilar lymph nodes. Lungs/Pleura: No pneumothorax. No pleural effusion. Mild centrilobular and paraseptal emphysema. Coarsely calcified 3 mm peripheral right upper lobe granuloma. Mild hypoventilatory changes in the dependent lower lobes bilaterally. No acute consolidative airspace disease, lung masses or significant pulmonary nodules. Musculoskeletal:  No aggressive appearing focal osseous lesions. Review of the MIP images confirms the above findings. CTA ABDOMEN AND PELVIS FINDINGS VASCULAR Aorta: Aortic atherosclerosis. Acute type B dissection extends throughout the abdominal aorta into the left common iliac artery, with significant narrowing of the true lumen in the infrarenal abdominal aorta. Ectatic 2.9 cm infrarenal abdominal aorta. Celiac: Patent, arising from the true lumen. SMA: Patent, arising from the true lumen. Renals: Patent left renal artery arising from the true lumen. Patent right renal artery noting extension of the dissection flap into the origin of the right renal artery. IMA: Patent, appearing to arise from the false lumen. Inflow: Dissection flap extends into the left common iliac artery. Common, external and internal iliac arteries are patent without high-grade stenoses. Veins: No obvious venous abnormality within the limitations of this arterial phase study. Review of the MIP images confirms the above findings. NON-VASCULAR Hepatobiliary: Normal liver with no liver mass. Cholelithiasis. No biliary ductal dilatation. Pancreas: Normal, with no mass or duct dilation. Spleen: Normal size. No mass. Adrenals/Urinary Tract: Normal adrenals. Normal size kidneys with symmetric normal contrast nephrograms. No renal masses. No hydronephrosis. Normal bladder. Stomach/Bowel: Grossly normal stomach. Normal caliber small bowel with no small bowel wall thickening. Appendectomy. Minimal left colonic diverticulosis, with no large bowel  wall thickening or pericolonic fat stranding. Vascular/Lymphatic: Atherosclerotic abdominal aorta with acute type B dissection as detailed above. No pathologically enlarged lymph nodes in the abdomen or pelvis. Reproductive: Top-normal size prostate with nonspecific internal prostatic calcifications. Other: No pneumoperitoneum, ascites or focal fluid collection. Musculoskeletal: No aggressive appearing focal osseous lesions. Mild lumbar spondylosis. Review of the MIP images confirms the above findings. IMPRESSION: 1. 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 Extension of the dissection flap into the innominate artery and bilateral common carotid arteries, with significant narrowing of  the true lumens in the common carotid arteries bilaterally. Possible extension of the dissection flaps into the origins of the left main and right coronary arteries. No hemopericardium. Preserved visceral perfusion in the abdomen and pelvis. Preserved iliofemoral arterial inflow to the lower extremities. 2. Ectatic ascending thoracic aorta, 4.3 cm diameter. Ectatic infrarenal abdominal aorta, 2.9 cm diameter. 3. Aortic Atherosclerosis (ICD10-I70.0) and Emphysema (ICD10-J43.9). 4. Cholelithiasis. Critical Value/emergent results were called by telephone at the time of interpretation on 03/23/2017 at 5:06 pm to Dr. Merrily Pew , who verbally acknowledged these results. Electronically Signed   By: Ilona Sorrel M.D.   On: 03/23/2017 17:27    Procedures Procedures (including critical care time)  CRITICAL CARE Performed by: Merrily Pew Total critical care time: 35 minutes Critical care time was exclusive of separately billable procedures and treating other patients. Critical care was necessary to treat or prevent imminent or life-threatening deterioration. Critical care was time spent personally by me on the following activities:  development of treatment plan with patient and/or surrogate as well as nursing, discussions with consultants, evaluation of patient's response to treatment, examination of patient, obtaining history from patient or surrogate, ordering and performing treatments and interventions, ordering and review of laboratory studies, ordering and review of radiographic studies, pulse oximetry and re-evaluation of patient's condition.   Medications Ordered in ED Medications  nitroGLYCERIN (NITROSTAT) SL tablet 0.4 mg (not administered)  esmolol (BREVIBLOC) 2000 mg / 100 mL (20 mg/mL) infusion (not administered)  dexmedetomidine (PRECEDEX) 400 MCG/100ML (4 mcg/mL) infusion (not administered)  insulin regular (NOVOLIN R,HUMULIN R) 100 Units in sodium chloride 0.9 % 100 mL (1 Units/mL) infusion (not administered)  EPINEPHrine (ADRENALIN) 4 mg in dextrose 5 % 250 mL (0.016 mg/mL) infusion (not administered)  DOPamine (INTROPIN) 800 mg in dextrose 5 % 250 mL (3.2 mg/mL) infusion (not administered)  nitroGLYCERIN 50 mg in dextrose 5 % 250 mL (0.2 mg/mL) infusion (not administered)  phenylephrine (NEO-SYNEPHRINE) 20 mg in sodium chloride 0.9 % 250 mL (0.08 mg/mL) infusion (not administered)  heparin 2,500 Units, papaverine 30 mg in electrolyte-148 (PLASMALYTE-148) 500 mL irrigation (not administered)  heparin 30,000 units/NS 1000 mL solution for CELLSAVER (not administered)  potassium chloride injection 80 mEq (not administered)  magnesium sulfate (IV Push/IM) injection 40 mEq (not administered)  tranexamic acid (CYKLOKAPRON) pump prime solution 175 mg (not administered)  tranexamic acid (CYKLOKAPRON) bolus via infusion - over 30 minutes 1,312.5 mg (not administered)  tranexamic acid (CYKLOKAPRON) 2,500 mg in sodium chloride 0.9 % 250 mL (10 mg/mL) infusion (not administered)  vancomycin (VANCOCIN) 1,250 mg in sodium chloride 0.9 % 250 mL IVPB (not administered)  levofloxacin (LEVAQUIN) IVPB 500 mg (not  administered)  vancomycin (VANCOCIN) 1,000 mg in sodium chloride 0.9 % 1,000 mL irrigation (not administered)  [place] Blood Cardioplegia (KBC) mannitol 20% Syringe (67mL) (not administered)  [place] Blood Cardioplegia (KBC) mannitol 20% Syringe (41mL) (not administered)  [place] Blood Cardioplegia (KBC) lidocaine 2% Syringe (43mL) (not administered)  [place] Blood Cardioplegia (KBC) lidocaine 2% Syringe (63mL) (not administered)  fentaNYL (SUBLIMAZE) injection 100 mcg (100 mcg Intravenous Given 03/23/17 1708)  iopamidol (ISOVUE-370) 76 % injection (100 mLs  Contrast Given 03/23/17 1645)     Initial Impression / Assessment and Plan / ED Course  I have reviewed the triage vital signs and the nursing notes.  Pertinent labs & imaging results that were available during my care of the patient were reviewed by me and considered in my medical decision making (see chart for details).  Will  eval for dissection vs pneumonia vs acs. start with NTG and fentanyl for symptoms.   Clinical Course as of Mar 24 1739  Tue Mar 23, 2017  1705 My read of ct positive for dissection in ascending/descending aorta. Discussed with radiologist who agrees and states it is also into carotids bilaterally.  CT surgery paged after my initial read and agree to see patient.  Fentanyl given, will hold NTG.  Will start esmolol if BP increases.   [JM]  5001 Repaged CTS to update on carotid involvement.   [JM]    Clinical Course User Index [JM] Earma Nicolaou, [name], MD    Esmolol started.   CT at bedside and patient to OR.   Final Clinical Impressions(s) / ED Diagnoses   Final diagnoses:  Dissection of thoracic aorta [facility])     Iyania Denne, [name], MD 03/23/17 [phone]

## 2017-03-23 NOTE — Anesthesia Procedure Notes (Signed)
Central Venous Catheter Insertion Performed by: Suzette Battiest, anesthesiologist Start/End8/14/2018 5:58 PM, 03/23/2017 6:08 PM Patient location: Pre-op. Preanesthetic checklist: patient identified, IV checked, site marked, risks and benefits discussed, surgical consent, monitors and equipment checked, pre-op evaluation, timeout performed and anesthesia consent Position: Trendelenburg Lidocaine 1% used for infiltration and patient sedated Hand hygiene performed , maximum sterile barriers used  and Seldinger technique used Catheter size: 9 Fr Total catheter length 10. Central line and PA cath was placed.MAC introducer Swan type:thermodilution PA Cath depth:50 Procedure performed using ultrasound guided technique. Ultrasound Notes:anatomy identified, needle tip was noted to be adjacent to the nerve/plexus identified, no ultrasound evidence of intravascular and/or intraneural injection and image(s) printed for medical record Attempts: 1 Following insertion, line sutured and dressing applied. Post procedure assessment: blood return through all ports, free fluid flow and no air  Patient tolerated the procedure well with no immediate complications.

## 2017-03-23 NOTE — H&P (Signed)
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 He initially had shortness of breath but this has improved. He denies any abdominal pain. He denies any visual disturbances or numbness and/or weakness involving either upper or lower extremity. Blood pressure has been moderately elevated or normal in the emergency department but stable. CT angiogram of the chest, abdomen, and pelvis demonstrates acute type A aortic dissection. Cardiothoracic surgical consultation was requested.    Patient [place].  He has been retired from [employer] business for several years but remains physically active, working his forearm. He reports no significant physical limitations up until recently. He states that over the past week or 2 he has felt fatigued. He has never had any chest pain or chest tightness prior to that which developed acutely dissected afternoon. He denies any problems with exertional shortness of breath, PND, orthopnea, or lower extremity edema. He smokes 2 packs of cigarettes daily but denies any known history of COPD or emphysema. He has known history of hypertension but admits that he does not take his blood pressure medications regularly. He takes Nexium and flecainide on a daily basis. He does not take any other medications regularly.   Past Medical History:  Diagnosis Date  . Allergy   . Arthritis   . Atrial  fibrillation ([place])   . Cataract    removed both eyes   . GERD (gastroesophageal reflux disease)   . HYPERLIPIDEMIA   . 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 Hypertension   . Microscopic hematuria    negative cystoscopy  . NONSPECIFIC ABNORMAL ELECTROCARDIOGRAM   . Varicose veins     Past Surgical History:  Procedure Laterality Date  . APPENDECTOMY    . CARDIAC CATHETERIZATION      X3;Dr Caryl Comes, last in July, 2013  . COLONOSCOPY    . CORONARY ANGIOPLASTY     04/24/13 : he denies angioplasty  . CYSTOSCOPY  1978   Dr Hartley Barefoot  . KNEE ARTHROSCOPY  2012   Dr Theda Sers  . LUMBAR LAMINECTOMY  1987   Dr Durward Fortes  . MYELOGRAM  2007  . POLYPECTOMY    . ROTATOR CUFF REPAIR    . SHOULDER ARTHROSCOPY  2011   Dr Theda Sers  . SHOULDER ARTHROSCOPY Right 08/2015  . SPINAL FUSION  1986   Dr Rolin Barry  . TRACHEOSTOMY     age 53 for croup    Family History  Problem Relation Age of Onset  . Hypertension Mother   . Hypertension Father   . Benign prostatic hyperplasia Father        S/P TURP  . Heart attack Maternal Grandmother        MI in 6s  . Breast cancer Maternal Grandmother   . Arrhythmia Brother  X 2  . Heart attack Maternal Aunt        MI in 68s  . Stroke Neg Hx   . Diabetes Neg Hx   . Colon cancer Neg Hx   . Colon polyps Neg Hx   . Stomach cancer Neg Hx   . Rectal cancer Neg Hx   . Esophageal cancer Neg Hx   . Prostate cancer Neg Hx     Social History   Social History  . Marital status: Married    Spouse name: N/A  . Number of children: 2  . Years of education: N/A   Occupational History  . 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 Smoking status: Current Every Day Smoker    Packs/day: 1.50    Years: 30.00    Types: Cigarettes  . Smokeless tobacco: Never Used     Comment: 1 ppd   . Alcohol use 0.0 oz/week     Comment: 6-7 cans of beer daily  . Drug use: No  . Sexual activity: Not on file   Other Topics Concern  .  Not on file   Social History Narrative   Lives w/ wife    Prior to Admission medications   Medication Sig Start Date End Date Taking? Authorizing Provider  albuterol (PROAIR HFA) 108 (90 Base) MCG/ACT inhaler Inhale two puffs every four to six hours as needed for cough or wheeze. Patient taking differently: Inhale 2 puffs into the lungs See admin instructions. Every 4-6 hours as needed for coughing or wheezing 01/05/17  Yes Kozlow, Donnamarie Poag, MD  amLODipine (NORVASC) 5 MG tablet Take 1 tablet (5 mg total) by mouth daily. 07/24/15  Yes Paz, Alda Berthold, MD  azelastine (ASTELIN) 0.1 % nasal spray Can use two sprays in each nostril twice daily as directed. Patient taking differently: Place 2 sprays into both nostrils 2 (two) times daily.  01/05/17  Yes Kozlow, Donnamarie Poag, MD  betamethasone dipropionate (DIPROLENE) 0.05 % ointment Apply 1 application topically at bedtime. APPLY TO BACK AREA 11/30/16  Yes [provider]  calcipotriene-betamethasone (TACLONEX) ointment Apply 1 application topically daily as needed (for eczema).    Yes [provider]  esomeprazole (NEXIUM) 40 MG capsule Take 1 capsule (40 mg total) by mouth daily before breakfast. 01/05/17  Yes Kozlow, Donnamarie Poag, MD  flecainide (TAMBOCOR) 100 MG tablet Take 1 tablet (100 mg total) by mouth 2 (two) times daily. 02/02/17  Yes Deboraha Sprang, MD  fluticasone [facility]) 50 MCG/ACT nasal spray Use one spray in each nostril twice daily. 01/05/17  Yes Kozlow, Donnamarie Poag, MD  ipratropium (ATROVENT) 0.06 % nasal spray Can use two sprays in each nostril every six hours as needed to dry up nose. Patient taking differently: Place 2 sprays into both nostrils every 6 (six) hours as needed for rhinitis.  12/24/15  Yes Kozlow, Donnamarie Poag, MD  rosuvastatin (CRESTOR) 5 MG tablet Take 5 mg by mouth every other day.   Yes [provider]  triamcinolone cream (KENALOG) 0.1 % Apply 1 application topically See admin instructions. To affected site daily as  needed for irritation 12/18/16  Yes [provider]  sildenafil (VIAGRA) 100 MG tablet Take 0.5-1 tablets (50-100 mg total) by mouth daily as needed for erectile dysfunction. Patient not taking: Reported on 03/23/2017 05/10/14   Colon Branch, MD    Current Facility-Administered Medications  Medication Dose Route Frequency Provider Last Rate Last Dose  . 0.9 %  sodium chloride infusion  500 mL Intravenous Continuous Pyrtle, Lajuan Lines, MD      . dexmedetomidine (PRECEDEX) 400 MCG/100ML (4 mcg/mL) infusion  0.1-0.7 mcg/kg/hr Intravenous To OR Rexene Alberts, MD      . DOPamine (INTROPIN) 800 mg in dextrose 5 % 250 mL (3.2 mg/mL) infusion  0-10 mcg/kg/min Intravenous To OR Rexene Alberts, MD      . EPINEPHrine (ADRENALIN) 4 mg in dextrose 5 % 250 mL (0.016 mg/mL) infusion  0-10 mcg/min Intravenous To OR Rexene Alberts, MD      . esmolol (BREVIBLOC) 2000 mg / 100 mL (20 mg/mL) infusion  25-300 mcg/kg/min Intravenous Once Mesner, [name], MD      . heparin 2,500 Units, papaverine 30 mg in electrolyte-148 (PLASMALYTE-148) 500 mL irrigation   Irrigation To OR Rexene Alberts, MD      . heparin 30,000 units/NS 1000 mL solution for CELLSAVER   Other To OR Rexene Alberts, MD      . insulin regular (NOVOLIN R,HUMULIN R) 100 Units in sodium chloride 0.9 % 100 mL (1 Units/mL) infusion   Intravenous To OR Rexene Alberts, MD      . Burgess Amor Blood Cardioplegia [facility]) lidocaine 2% Syringe (62mL)  13 mL Intracoronary Once Rexene Alberts, MD      . Burgess Amor Blood Cardioplegia [facility]) lidocaine 2% Syringe (69mL)  13 mL Intracoronary Once Rexene Alberts, MD      . Burgess Amor Blood Cardioplegia ([facility]) mannitol 20% Syringe (95mL)  32 mL Intracoronary Once Rexene Alberts, MD      . Burgess Amor Blood Cardioplegia ([facility]) mannitol 20% Syringe (85mL)  32 mL Intracoronary Once Rexene Alberts, MD      . levofloxacin (LEVAQUIN) IVPB 500 mg  500 mg Intravenous To OR Rexene Alberts, MD      . magnesium sulfate  (IV Push/IM) injection 40 mEq  40 mEq Other To OR Rexene Alberts, MD      . nitroGLYCERIN (NITROSTAT) SL tablet 0.4 mg  0.4 mg Sublingual Q5 min PRN Mesner, [name], MD      . nitroGLYCERIN 50 mg in dextrose 5 % 250 mL (0.2 mg/mL) infusion  2-200 mcg/min Intravenous To OR Rexene Alberts, MD      . phenylephrine (NEO-SYNEPHRINE) 20 mg in sodium chloride 0.9 % 250 mL (0.08 mg/mL) infusion  30-200 mcg/min Intravenous To OR Rexene Alberts, MD      . potassium chloride injection 80 mEq  80 mEq Other To OR Rexene Alberts, MD      . tranexamic acid (CYKLOKAPRON) 2,500 mg in sodium chloride 0.9 % 250 mL (10 mg/mL) infusion  1.5 mg/kg/hr Intravenous To OR Rexene Alberts, MD      . tranexamic acid (CYKLOKAPRON) bolus via infusion - over 30 minutes 1,312.5 mg  15 mg/kg Intravenous To OR Rexene Alberts, MD      . tranexamic acid (CYKLOKAPRON) pump prime solution 175 mg  2 mg/kg Intracatheter To OR Rexene Alberts, MD      . vancomycin (VANCOCIN) 1,000 mg in sodium chloride 0.9 % 1,000 mL irrigation   Irrigation To OR Rexene Alberts, MD      . vancomycin (VANCOCIN) 1,250 mg in sodium chloride 0.9 % 250 mL IVPB  1,250 mg Intravenous To OR Rexene Alberts, MD       Current Outpatient Prescriptions  Medication Sig Dispense Refill  . albuterol (PROAIR HFA) 108 (90 Base) MCG/ACT inhaler Inhale  two puffs every four to six hours as needed for cough or wheeze. (Patient taking differently: Inhale 2 puffs into the lungs See admin instructions. Every 4-6 hours as needed for coughing or wheezing) 3 Inhaler 0  . amLODipine (NORVASC) 5 MG tablet Take 1 tablet (5 mg total) by mouth daily. 90 tablet 2  . azelastine (ASTELIN) 0.1 % nasal spray Can use two sprays in each nostril twice daily as directed. (Patient taking differently: Place 2 sprays into both nostrils 2 (two) times daily. ) 90 mL 3  . betamethasone dipropionate (DIPROLENE) 0.05 % ointment Apply 1 application topically at bedtime. APPLY TO BACK AREA      . calcipotriene-betamethasone (TACLONEX) ointment Apply 1 application topically daily as needed (for eczema).     . esomeprazole (NEXIUM) 40 MG capsule Take 1 capsule (40 mg total) by mouth daily before breakfast. 90 capsule 3  . flecainide (TAMBOCOR) 100 MG tablet Take 1 tablet (100 mg total) by mouth 2 (two) times daily. 12 tablet 0  . fluticasone (FLONASE) 50 MCG/ACT nasal spray Use one spray in each nostril twice daily. 48 g 3  . ipratropium (ATROVENT) 0.06 % nasal spray Can use two sprays in each nostril every six hours as needed to dry up nose. (Patient taking differently: Place 2 sprays into both nostrils every 6 (six) hours as needed for rhinitis. ) 45 mL 3  . rosuvastatin (CRESTOR) 5 MG tablet Take 5 mg by mouth every other day.    . triamcinolone cream (KENALOG) 0.1 % Apply 1 application topically See admin instructions. To affected site daily as needed for irritation  0  . sildenafil (VIAGRA) 100 MG tablet Take 0.5-1 tablets (50-100 mg total) by mouth daily as needed for erectile dysfunction. (Patient not taking: Reported on 03/23/2017) 5 tablet 3    Allergies  Allergen Reactions  . Simvastatin Other (See Comments)    Mental status changes  . Celecoxib Other (See Comments)    GI UPSET AND INFLAMMATION  . Codeine Other (See Comments)    GI UPSET AND INFLAMMATION  . Nsaids Other (See Comments)    GI UPSET AND INFLAMMATION (can tolerate via IV)  . Tape Other (See Comments)    Medical tape and Band-Aids PULL OFF THE SKIN; please use Coban wrap  . Cephalexin Itching and Rash  . Latex Rash      Review of Systems:   General:  normal appetite, decreased energy, no weight gain, no weight loss, no fever  Cardiac:  no chest pain with exertion, + sudden onset chest pain at rest, no SOB with exertion, no resting SOB, no PND, no orthopnea, no palpitations, + arrhythmia, + atrial fibrillation, no LE edema, no dizzy spells, no syncope  Respiratory:  no shortness of breath, no home oxygen,  + productive cough, + dry cough, no bronchitis, + wheezing, no hemoptysis, + asthma, no pain with inspiration or cough, no sleep apnea, no CPAP at night  GI:   no difficulty swallowing, + reflux, + frequent heartburn, + hiatal hernia, no abdominal pain, no constipation, no diarrhea, no hematochezia, no hematemesis, no melena  GU:   no dysuria,  no frequency, no urinary tract infection, no hematuria, + enlarged prostate, no kidney stones, no kidney disease  Vascular:  no pain suggestive of claudication, no pain in feet, no leg cramps, + varicose veins, no DVT, no non-healing foot ulcer  Neuro:   no stroke, no TIA's, no seizures, no headaches, no temporary blindness one eye,  no  slurred speech, no peripheral neuropathy, no chronic pain, no instability of gait, no memory/cognitive dysfunction  Musculoskeletal: no arthritis , no joint swelling, no myalgias, no difficulty walking, normal mobility   Skin:   no rash, no itching, no skin infections, no pressure sores or ulcerations  Psych:   no anxiety, no depression, no nervousness, no unusual recent stress  Eyes:   no blurry vision, no floaters, no recent vision changes, + wears glasses or contacts  ENT:   no hearing loss, no loose or painful teeth, no dentures  Hematologic:  no easy bruising, no abnormal bleeding, no clotting disorder, no frequent epistaxis  Endocrine:  no diabetes, does not check CBG's at homeno     Physical Exam:   BP (!) 125/58   Pulse (!) 53   Resp 13   Ht 6\' 1"  (1.854 m)   Wt 193 lb (87.5 kg)   SpO2 100%   BMI 25.46 kg/m   General:  WDWN male in acute distress due to pain   HEENT:  Unremarkable   Neck:   no JVD, no bruits, no adenopathy   Chest:   clear to auscultation, symmetrical breath sounds, no wheezes, no rhonchi   CV:   RRR, no  murmur   Abdomen:  soft, non-tender, no masses   Extremities:  warm, well-perfused, pulses diminished but palpable left wrist and both lower legs, strong in right wrist, no lower  extremity edema  Rectal/GU  Deferred  Neuro:   Grossly non-focal and symmetrical throughout  Skin:   Clean and dry, no rashes, no breakdown  Diagnostic Tests:  CT ANGIOGRAPHY CHEST, ABDOMEN AND PELVIS  TECHNIQUE: Multidetector CT imaging through the chest, abdomen and pelvis was performed using the standard protocol during bolus administration of intravenous contrast. Multiplanar reconstructed images and MIPs were obtained and reviewed to evaluate the vascular anatomy.  CONTRAST:  100 cc Isovue 370 IV.  COMPARISON:  Chest radiograph from earlier today. 03/22/2017 chest CT.  FINDINGS: CTA CHEST FINDINGS  Cardiovascular: Top-normal heart size, increased from prior chest CT. No significant pericardial fluid/thickening. 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 There is possible extension of the dissection flap into the origin of the left main and right coronary arteries. There is extension of the dissection flap into the innominate artery and bilateral common carotid arteries, noting significant attenuation of the true lumens in the common carotid arteries bilaterally (series 5/image 2). Subclavian and vertebral arteries remain patent bilaterally without involvement by the dissection. There is significant attenuation of the true lumen in the ascending aorta. Atherosclerotic mildly tortuous thoracic aorta. Ectatic ascending thoracic aorta, maximum diameter 4.3 cm. Normal caliber pulmonary arteries. No central pulmonary emboli.  Mediastinum/Nodes: No discrete thyroid nodules. Unremarkable esophagus. No pathologically enlarged axillary, mediastinal or hilar lymph nodes.  Lungs/Pleura: No pneumothorax. No pleural effusion. Mild centrilobular and paraseptal emphysema. Coarsely calcified 3 mm peripheral right upper lobe granuloma. Mild hypoventilatory changes in the dependent lower lobes bilaterally. No acute consolidative airspace  disease, lung masses or significant pulmonary nodules.  Musculoskeletal:  No aggressive appearing focal osseous lesions.  Review of the MIP images confirms the above findings.  CTA ABDOMEN AND PELVIS FINDINGS  VASCULAR  Aorta: Aortic atherosclerosis. Acute type B dissection extends throughout the abdominal aorta into the left common iliac artery, with significant narrowing of the true lumen in the infrarenal abdominal aorta. Ectatic 2.9 cm infrarenal abdominal aorta.  Celiac: Patent, arising from the true lumen.  SMA: Patent,  arising from the true lumen.  Renals: Patent left renal artery arising from the true lumen. Patent right renal artery noting extension of the dissection flap into the origin of the right renal artery.  IMA: Patent, appearing to arise from the false lumen.  Inflow: Dissection flap extends into the left common iliac artery. Common, external and internal iliac arteries are patent without high-grade stenoses.  Veins: No obvious venous abnormality within the limitations of this arterial phase study.  Review of the MIP images confirms the above findings.  NON-VASCULAR  Hepatobiliary: Normal liver with no liver mass. Cholelithiasis. No biliary ductal dilatation.  Pancreas: Normal, with no mass or duct dilation.  Spleen: Normal size. No mass.  Adrenals/Urinary Tract: Normal adrenals. Normal size kidneys with symmetric normal contrast nephrograms. No renal masses. No hydronephrosis. Normal bladder.  Stomach/Bowel: Grossly normal stomach. Normal caliber small bowel with no small bowel wall thickening. Appendectomy. Minimal left colonic diverticulosis, with no large bowel wall thickening or pericolonic fat stranding.  Vascular/Lymphatic: Atherosclerotic abdominal aorta with acute type B dissection as detailed above. No pathologically enlarged lymph nodes in the abdomen or pelvis.  Reproductive: Top-normal size prostate with  nonspecific internal prostatic calcifications.  Other: No pneumoperitoneum, ascites or focal fluid collection.  Musculoskeletal: No aggressive appearing focal osseous lesions. Mild lumbar spondylosis.  Review of the MIP images confirms the above findings.  IMPRESSION: 1. 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 Extension of the dissection flap into the innominate artery and bilateral common carotid arteries, with significant narrowing of the true lumens in the common carotid arteries bilaterally. Possible extension of the dissection flaps into the origins of the left main and right coronary arteries. No hemopericardium. Preserved visceral perfusion in the abdomen and pelvis. Preserved iliofemoral arterial inflow to the lower extremities. 2. Ectatic ascending thoracic aorta, 4.3 cm diameter. Ectatic infrarenal abdominal aorta, 2.9 cm diameter. 3. Aortic Atherosclerosis (ICD10-I70.0) and Emphysema (ICD10-J43.9). 4. Cholelithiasis.  Critical Value/emergent results were called by telephone at the time of interpretation on 03/23/2017 at 5:06 pm to Dr. Merrily Pew , who verbally acknowledged these results.   Electronically Signed   By: Ilona Sorrel M.D.   On: 03/23/2017 17:27   Impression:  Acute type A aortic dissection.  Patient needs emergent surgical repair.   Plan:  I discussed the nature of the patient's presenting problem with the patient and his family at the bedside in the emergency room.  The natural history of this disease and prognosis with medical therapy alone was discussed. Surgical plans were reviewed including need to replace at least the ascending thoracic aorta with possible replacement of the transverse aortic arch and reimplantation of the arch vessels, possible need for aortic valve repair or aortic root replacement, possible need for coronary artery bypass  grafting.  The patient and his family understand and accept all potential risks of surgery including but not limited to risk of death, stroke or other neurologic complication, myocardial infarction, congestive heart failure, respiratory failure, renal failure, bleeding requiring transfusion and/or reexploration, arrhythmia, possible need for pacemaker placement, infection or other wound complications, pneumonia, pleural and/or pericardial effusion, pulmonary embolus, other major vascular complication, or delayed complications related to valve repair or replacement including but not limited to structural valve deterioration and failure, thrombosis, embolization, endocarditis, or paravalvular leak.  In the event that the patient's aortic valve needs to be replaced we discussed whether not to replace it using  a mechanical prosthetic valve or a bioprosthetic tissue valve. The relative risks and benefits of each were discussed and the patient specifically requests that his valve be replaced using a bioprosthetic tissue valve with valve replacement is necessary.  All of their questions have been answered.   I spent in excess of 60 minutes during the conduct of this hospital consultation and >50% of this time involved direct face-to-face encounter for counseling and/or coordination of the patient's care.    Valentina Gu. Roxy Manns, MD 03/23/2017 5:39 PM

## 2017-03-23 NOTE — Anesthesia Procedure Notes (Signed)
Central Venous Catheter Insertion Performed by: Suzette Battiest, anesthesiologist Start/End8/14/2018 5:58 PM, 03/23/2017 6:08 PM Patient location: Pre-op. Preanesthetic checklist: patient identified, IV checked, site marked, risks and benefits discussed, surgical consent, monitors and equipment checked, pre-op evaluation, timeout performed and anesthesia consent Hand hygiene performed  and maximum sterile barriers used  PA cath was placed.Swan type:thermodilution PA Cath depth:55 Procedure performed using ultrasound guided technique. Ultrasound Notes:anatomy identified, needle tip was noted to be adjacent to the nerve/plexus identified, no ultrasound evidence of intravascular and/or intraneural injection and image(s) printed for medical record Attempts: 1 Patient tolerated the procedure well with no immediate complications.

## 2017-03-23 NOTE — Anesthesia Preprocedure Evaluation (Signed)
Anesthesia Evaluation  Patient identified by MRN, date of birth, ID band Patient awake    Reviewed: Allergy & Precautions, NPO status Preop documentation limited or incomplete due to emergent nature of procedure.  Airway Mallampati: II  TM Distance: >3 FB     Dental  (+) Dental Advisory Given   Pulmonary Current Smoker,    Pulmonary exam normal        Cardiovascular hypertension, + Peripheral Vascular Disease  + dysrhythmias  Rhythm:Regular Rate:Normal  Acute ascending aortic dissection.    Neuro/Psych    GI/Hepatic Neg liver ROS, GERD  ,  Endo/Other  negative endocrine ROS  Renal/GU negative Renal ROS     Musculoskeletal  (+) Arthritis ,   Abdominal   Peds  Hematology negative hematology ROS (+)   Anesthesia Other Findings   Reproductive/Obstetrics                             Lab Results  Component Value Date   WBC 12.4 (H) 03/23/2017   HGB 14.4 03/23/2017   HCT 42.1 03/23/2017   MCV 86.3 03/23/2017   PLT 198 03/23/2017   Lab Results  Component Value Date   CREATININE 1.20 03/23/2017   BUN 8 03/23/2017   NA 131 (L) 03/23/2017   K 4.0 03/23/2017   CL 99 (L) 03/23/2017   CO2 23 03/23/2017    Anesthesia Physical Anesthesia Plan  ASA: V and emergent  Anesthesia Plan: General   Post-op Pain Management:    Induction: Intravenous  PONV Risk Score and Plan: 2 and Ondansetron, Dexamethasone and Treatment may vary due to age or medical condition  Airway Management Planned: Oral ETT  Additional Equipment: Arterial line, CVP, PA Cath, TEE and Ultrasound Guidance Line Placement  Intra-op Plan:   Post-operative Plan: Post-operative intubation/ventilation  Informed Consent: I have reviewed the patients History and Physical, chart, labs and discussed the procedure including the risks, benefits and alternatives for the proposed anesthesia with the patient or authorized  representative who has indicated his/her understanding and acceptance.     Plan Discussed with:   Anesthesia Plan Comments:         Anesthesia Quick Evaluation

## 2017-03-23 NOTE — ED Notes (Signed)
Patient here for evaluation of central chest pain with radiation to the back.  Patient states he had similar pain a year ago when admitted with pneumonia. Recieved 324 ASA and 4 NITRO.  Pressures were 493 systolic and above throughout transport for EMS. Hx of smoking but no other lung disease. Patient is A&Ox4 and remains diaphoretic.  EKG and labs sent.

## 2017-03-23 NOTE — Anesthesia Procedure Notes (Signed)
Arterial Line Insertion Start/End8/14/2018 5:53 PM Performed by: Lance Coon, CRNA  Patient location: OR. Left, radial was placed Catheter size: 20 G Hand hygiene performed  and maximum sterile barriers used   Attempts: 1 Procedure performed without using ultrasound guided technique. Following insertion, dressing applied and Biopatch. Post procedure assessment: normal  Patient tolerated the procedure well with no immediate complications.

## 2017-03-23 NOTE — OR Nursing (Signed)
1758 Pt's under shorts cut off in order to place foley catheter.  Placed in bag and labeled with pt. Sticker.

## 2017-03-24 ENCOUNTER — Inpatient Hospital Stay (HOSPITAL_COMMUNITY): Payer: Medicare Other

## 2017-03-24 ENCOUNTER — Encounter (HOSPITAL_COMMUNITY): Payer: Self-pay | Admitting: Thoracic Surgery (Cardiothoracic Vascular Surgery)

## 2017-03-24 DIAGNOSIS — Z888 Allergy status to other drugs, medicaments and biological substances status: Secondary | ICD-10-CM | POA: Diagnosis not present

## 2017-03-24 DIAGNOSIS — Z8249 Family history of ischemic heart disease and other diseases of the circulatory system: Secondary | ICD-10-CM | POA: Diagnosis not present

## 2017-03-24 DIAGNOSIS — I7101 Dissection of thoracic aorta: Secondary | ICD-10-CM | POA: Diagnosis not present

## 2017-03-24 DIAGNOSIS — Z981 Arthrodesis status: Secondary | ICD-10-CM | POA: Diagnosis not present

## 2017-03-24 DIAGNOSIS — J309 Allergic rhinitis, unspecified: Secondary | ICD-10-CM | POA: Diagnosis present

## 2017-03-24 DIAGNOSIS — R410 Disorientation, unspecified: Secondary | ICD-10-CM | POA: Diagnosis not present

## 2017-03-24 DIAGNOSIS — D62 Acute posthemorrhagic anemia: Secondary | ICD-10-CM | POA: Diagnosis not present

## 2017-03-24 DIAGNOSIS — J9811 Atelectasis: Secondary | ICD-10-CM | POA: Diagnosis not present

## 2017-03-24 DIAGNOSIS — I313 Pericardial effusion (noninflammatory): Secondary | ICD-10-CM | POA: Diagnosis not present

## 2017-03-24 DIAGNOSIS — M1990 Unspecified osteoarthritis, unspecified site: Secondary | ICD-10-CM | POA: Diagnosis present

## 2017-03-24 DIAGNOSIS — J939 Pneumothorax, unspecified: Secondary | ICD-10-CM | POA: Diagnosis not present

## 2017-03-24 DIAGNOSIS — E871 Hypo-osmolality and hyponatremia: Secondary | ICD-10-CM | POA: Diagnosis not present

## 2017-03-24 DIAGNOSIS — Z953 Presence of xenogenic heart valve: Secondary | ICD-10-CM | POA: Diagnosis not present

## 2017-03-24 DIAGNOSIS — F17210 Nicotine dependence, cigarettes, uncomplicated: Secondary | ICD-10-CM | POA: Diagnosis present

## 2017-03-24 DIAGNOSIS — I319 Disease of pericardium, unspecified: Secondary | ICD-10-CM | POA: Diagnosis not present

## 2017-03-24 DIAGNOSIS — E8770 Fluid overload, unspecified: Secondary | ICD-10-CM | POA: Diagnosis not present

## 2017-03-24 DIAGNOSIS — Z4682 Encounter for fitting and adjustment of non-vascular catheter: Secondary | ICD-10-CM | POA: Diagnosis not present

## 2017-03-24 DIAGNOSIS — Z79899 Other long term (current) drug therapy: Secondary | ICD-10-CM | POA: Diagnosis not present

## 2017-03-24 DIAGNOSIS — I10 Essential (primary) hypertension: Secondary | ICD-10-CM | POA: Diagnosis present

## 2017-03-24 DIAGNOSIS — Z952 Presence of prosthetic heart valve: Secondary | ICD-10-CM | POA: Diagnosis not present

## 2017-03-24 DIAGNOSIS — E785 Hyperlipidemia, unspecified: Secondary | ICD-10-CM | POA: Diagnosis present

## 2017-03-24 DIAGNOSIS — D6959 Other secondary thrombocytopenia: Secondary | ICD-10-CM | POA: Diagnosis not present

## 2017-03-24 DIAGNOSIS — Z48812 Encounter for surgical aftercare following surgery on the circulatory system: Secondary | ICD-10-CM | POA: Diagnosis not present

## 2017-03-24 DIAGNOSIS — K5900 Constipation, unspecified: Secondary | ICD-10-CM | POA: Diagnosis not present

## 2017-03-24 DIAGNOSIS — I509 Heart failure, unspecified: Secondary | ICD-10-CM | POA: Diagnosis not present

## 2017-03-24 DIAGNOSIS — Z9889 Other specified postprocedural states: Secondary | ICD-10-CM

## 2017-03-24 DIAGNOSIS — E876 Hypokalemia: Secondary | ICD-10-CM | POA: Diagnosis not present

## 2017-03-24 DIAGNOSIS — J449 Chronic obstructive pulmonary disease, unspecified: Secondary | ICD-10-CM | POA: Diagnosis present

## 2017-03-24 DIAGNOSIS — N170 Acute kidney failure with tubular necrosis: Secondary | ICD-10-CM | POA: Diagnosis not present

## 2017-03-24 DIAGNOSIS — K8020 Calculus of gallbladder without cholecystitis without obstruction: Secondary | ICD-10-CM | POA: Diagnosis present

## 2017-03-24 DIAGNOSIS — I4891 Unspecified atrial fibrillation: Secondary | ICD-10-CM | POA: Diagnosis not present

## 2017-03-24 DIAGNOSIS — Z452 Encounter for adjustment and management of vascular access device: Secondary | ICD-10-CM | POA: Diagnosis not present

## 2017-03-24 DIAGNOSIS — R0602 Shortness of breath: Secondary | ICD-10-CM | POA: Diagnosis not present

## 2017-03-24 DIAGNOSIS — K219 Gastro-esophageal reflux disease without esophagitis: Secondary | ICD-10-CM | POA: Diagnosis present

## 2017-03-24 DIAGNOSIS — I7103 Dissection of thoracoabdominal aorta: Secondary | ICD-10-CM | POA: Diagnosis not present

## 2017-03-24 DIAGNOSIS — I480 Paroxysmal atrial fibrillation: Secondary | ICD-10-CM | POA: Diagnosis present

## 2017-03-24 HISTORY — DX: Other specified postprocedural states: Z98.890

## 2017-03-24 HISTORY — DX: Presence of xenogenic heart valve: Z95.3

## 2017-03-24 LAB — POCT I-STAT 3, ART BLOOD GAS (G3+)
ACID-BASE DEFICIT: 2 mmol/L (ref 0.0–2.0)
ACID-BASE DEFICIT: 10 mmol/L — AB (ref 0.0–2.0)
ACID-BASE DEFICIT: 2 mmol/L (ref 0.0–2.0)
ACID-BASE EXCESS: 3 mmol/L — AB (ref 0.0–2.0)
ACID-BASE EXCESS: 5 mmol/L — AB (ref 0.0–2.0)
ACID-BASE EXCESS: 3 mmol/L — AB (ref 0.0–2.0)
Acid-Base Excess: 1 mmol/L (ref 0.0–2.0)
Acid-Base Excess: 2 mmol/L (ref 0.0–2.0)
Acid-Base Excess: 2 mmol/L (ref 0.0–2.0)
Acid-Base Excess: 3 mmol/L — ABNORMAL HIGH (ref 0.0–2.0)
Acid-Base Excess: 3 mmol/L — ABNORMAL HIGH (ref 0.0–2.0)
Acid-base deficit: 7 mmol/L — ABNORMAL HIGH (ref 0.0–2.0)
BICARBONATE: 19 mmol/L — AB (ref 20.0–28.0)
BICARBONATE: 22.2 mmol/L (ref 20.0–28.0)
BICARBONATE: 26 mmol/L (ref 20.0–28.0)
BICARBONATE: 27.8 mmol/L (ref 20.0–28.0)
BICARBONATE: 28.2 mmol/L — AB (ref 20.0–28.0)
BICARBONATE: 28.3 mmol/L — AB (ref 20.0–28.0)
BICARBONATE: 29 mmol/L — AB (ref 20.0–28.0)
BICARBONATE: 31 mmol/L — AB (ref 20.0–28.0)
Bicarbonate: 22 mmol/L (ref 20.0–28.0)
Bicarbonate: 28.9 mmol/L — ABNORMAL HIGH (ref 20.0–28.0)
Bicarbonate: 26.8 mmol/L (ref 20.0–28.0)
Bicarbonate: 26.7 mmol/L (ref 20.0–28.0)
Bicarbonate: 26.7 mmol/L (ref 20.0–28.0)
O2 SAT: 100 %
O2 SAT: 100 %
O2 SAT: 90 %
O2 SAT: 91 %
O2 SAT: 92 %
O2 Saturation: 100 %
O2 Saturation: 86 %
O2 Saturation: 94 %
O2 Saturation: 94 %
O2 Saturation: 89 %
O2 Saturation: 95 %
O2 Saturation: 93 %
O2 Saturation: 90 %
PCO2 ART: 35.1 mmHg (ref 32.0–48.0)
PCO2 ART: 38.4 mmHg (ref 32.0–48.0)
PCO2 ART: 40 mmHg (ref 32.0–48.0)
PCO2 ART: 41.2 mmHg (ref 32.0–48.0)
PH ART: 7.037 — AB (ref 7.350–7.450)
PH ART: 7.285 — AB (ref 7.350–7.450)
PH ART: 7.357 (ref 7.350–7.450)
PH ART: 7.423 (ref 7.350–7.450)
PH ART: 7.451 — AB (ref 7.350–7.450)
PO2 ART: 320 mmHg — AB (ref 83.0–108.0)
PO2 ART: 79 mmHg — AB (ref 83.0–108.0)
PO2 ART: 65 mmHg — AB (ref 83.0–108.0)
PO2 ART: 54 mmHg — AB (ref 83.0–108.0)
PO2 ART: 58 mmHg — AB (ref 83.0–108.0)
Patient temperature: 37.3
Patient temperature: 37
TCO2: 20 mmol/L (ref 0–100)
TCO2: 23 mmol/L (ref 0–100)
TCO2: 24 mmol/L (ref 0–100)
TCO2: 27 mmol/L (ref 0–100)
TCO2: 28 mmol/L (ref 0–100)
TCO2: 28 mmol/L (ref 0–100)
TCO2: 28 mmol/L (ref 0–100)
TCO2: 29 mmol/L (ref 0–100)
TCO2: 30 mmol/L (ref 0–100)
TCO2: 30 mmol/L (ref 0–100)
TCO2: 31 mmol/L (ref 0–100)
TCO2: 31 mmol/L (ref 0–100)
TCO2: 33 mmol/L (ref 0–100)
pCO2 arterial: 45.9 mmHg (ref 32.0–48.0)
pCO2 arterial: 36.6 mmHg (ref 32.0–48.0)
pCO2 arterial: 81.9 mmHg (ref 32.0–48.0)
pCO2 arterial: 83.7 mmHg (ref 32.0–48.0)
pCO2 arterial: 66.1 mmHg (ref 32.0–48.0)
pCO2 arterial: 64.2 mmHg — ABNORMAL HIGH (ref 32.0–48.0)
pCO2 arterial: 50.4 mmHg — ABNORMAL HIGH (ref 32.0–48.0)
pCO2 arterial: 39.1 mmHg (ref 32.0–48.0)
pCO2 arterial: 41.2 mmHg (ref 32.0–48.0)
pH, Arterial: 7.361 (ref 7.350–7.450)
pH, Arterial: 7.391 (ref 7.350–7.450)
pH, Arterial: 7.137 — CL (ref 7.350–7.450)
pH, Arterial: 7.276 — ABNORMAL LOW (ref 7.350–7.450)
pH, Arterial: 7.262 — ABNORMAL LOW (ref 7.350–7.450)
pH, Arterial: 7.489 — ABNORMAL HIGH (ref 7.350–7.450)
pH, Arterial: 7.477 — ABNORMAL HIGH (ref 7.350–7.450)
pH, Arterial: 7.438 (ref 7.350–7.450)
pO2, Arterial: 255 mmHg — ABNORMAL HIGH (ref 83.0–108.0)
pO2, Arterial: 299 mmHg — ABNORMAL HIGH (ref 83.0–108.0)
pO2, Arterial: 76 mmHg — ABNORMAL LOW (ref 83.0–108.0)
pO2, Arterial: 80 mmHg — ABNORMAL LOW (ref 83.0–108.0)
pO2, Arterial: 97 mmHg (ref 83.0–108.0)
pO2, Arterial: 68 mmHg — ABNORMAL LOW (ref 83.0–108.0)
pO2, Arterial: 63 mmHg — ABNORMAL LOW (ref 83.0–108.0)
pO2, Arterial: 58 mmHg — ABNORMAL LOW (ref 83.0–108.0)

## 2017-03-24 LAB — POCT I-STAT, CHEM 8
BUN: 10 mg/dL (ref 6–20)
BUN: 10 mg/dL (ref 6–20)
BUN: 10 mg/dL (ref 6–20)
BUN: 10 mg/dL (ref 6–20)
BUN: 11 mg/dL (ref 6–20)
BUN: 11 mg/dL (ref 6–20)
BUN: 11 mg/dL (ref 6–20)
BUN: 13 mg/dL (ref 6–20)
BUN: 13 mg/dL (ref 6–20)
BUN: 14 mg/dL (ref 6–20)
BUN: 9 mg/dL (ref 6–20)
BUN: 9 mg/dL (ref 6–20)
CALCIUM ION: 1.14 mmol/L — AB (ref 1.15–1.40)
CALCIUM ION: 1.15 mmol/L (ref 1.15–1.40)
CALCIUM ION: 0.68 mmol/L — AB (ref 1.15–1.40)
CALCIUM ION: 1.14 mmol/L — AB (ref 1.15–1.40)
CALCIUM ION: 1.03 mmol/L — AB (ref 1.15–1.40)
CHLORIDE: 103 mmol/L (ref 101–111)
CHLORIDE: 93 mmol/L — AB (ref 101–111)
CHLORIDE: 95 mmol/L — AB (ref 101–111)
CHLORIDE: 96 mmol/L — AB (ref 101–111)
CHLORIDE: 96 mmol/L — AB (ref 101–111)
CHLORIDE: 97 mmol/L — AB (ref 101–111)
CREATININE: 0.8 mg/dL (ref 0.61–1.24)
CREATININE: 1 mg/dL (ref 0.61–1.24)
CREATININE: 1 mg/dL (ref 0.61–1.24)
CREATININE: 0.9 mg/dL (ref 0.61–1.24)
CREATININE: 0.7 mg/dL (ref 0.61–1.24)
CREATININE: 0.8 mg/dL (ref 0.61–1.24)
CREATININE: 0.9 mg/dL (ref 0.61–1.24)
CREATININE: 1 mg/dL (ref 0.61–1.24)
Calcium, Ion: 0.71 mmol/L — CL (ref 1.15–1.40)
Calcium, Ion: 0.88 mmol/L — CL (ref 1.15–1.40)
Calcium, Ion: 0.88 mmol/L — CL (ref 1.15–1.40)
Calcium, Ion: 0.96 mmol/L — ABNORMAL LOW (ref 1.15–1.40)
Calcium, Ion: 0.99 mmol/L — ABNORMAL LOW (ref 1.15–1.40)
Calcium, Ion: 1.03 mmol/L — ABNORMAL LOW (ref 1.15–1.40)
Calcium, Ion: 1.08 mmol/L — ABNORMAL LOW (ref 1.15–1.40)
Chloride: 98 mmol/L — ABNORMAL LOW (ref 101–111)
Chloride: 96 mmol/L — ABNORMAL LOW (ref 101–111)
Chloride: 94 mmol/L — ABNORMAL LOW (ref 101–111)
Chloride: 96 mmol/L — ABNORMAL LOW (ref 101–111)
Chloride: 95 mmol/L — ABNORMAL LOW (ref 101–111)
Chloride: 100 mmol/L — ABNORMAL LOW (ref 101–111)
Creatinine, Ser: 0.8 mg/dL (ref 0.61–1.24)
Creatinine, Ser: 0.9 mg/dL (ref 0.61–1.24)
Creatinine, Ser: 1 mg/dL (ref 0.61–1.24)
Creatinine, Ser: 1.1 mg/dL (ref 0.61–1.24)
GLUCOSE: 101 mg/dL — AB (ref 65–99)
GLUCOSE: 114 mg/dL — AB (ref 65–99)
GLUCOSE: 146 mg/dL — AB (ref 65–99)
GLUCOSE: 157 mg/dL — AB (ref 65–99)
GLUCOSE: 178 mg/dL — AB (ref 65–99)
GLUCOSE: 185 mg/dL — AB (ref 65–99)
GLUCOSE: 204 mg/dL — AB (ref 65–99)
GLUCOSE: 92 mg/dL (ref 65–99)
Glucose, Bld: 128 mg/dL — ABNORMAL HIGH (ref 65–99)
Glucose, Bld: 107 mg/dL — ABNORMAL HIGH (ref 65–99)
Glucose, Bld: 105 mg/dL — ABNORMAL HIGH (ref 65–99)
Glucose, Bld: 99 mg/dL (ref 65–99)
HCT: 23 % — ABNORMAL LOW (ref 39.0–52.0)
HCT: 23 % — ABNORMAL LOW (ref 39.0–52.0)
HCT: 26 % — ABNORMAL LOW (ref 39.0–52.0)
HCT: 33 % — ABNORMAL LOW (ref 39.0–52.0)
HCT: 33 % — ABNORMAL LOW (ref 39.0–52.0)
HCT: 33 % — ABNORMAL LOW (ref 39.0–52.0)
HCT: 40 % (ref 39.0–52.0)
HCT: 33 % — ABNORMAL LOW (ref 39.0–52.0)
HEMATOCRIT: 32 % — AB (ref 39.0–52.0)
HEMATOCRIT: 40 % (ref 39.0–52.0)
HEMATOCRIT: 35 % — AB (ref 39.0–52.0)
HEMATOCRIT: 31 % — AB (ref 39.0–52.0)
HEMOGLOBIN: 13.6 g/dL (ref 13.0–17.0)
HEMOGLOBIN: 11.9 g/dL — AB (ref 13.0–17.0)
HEMOGLOBIN: 7.8 g/dL — AB (ref 13.0–17.0)
HEMOGLOBIN: 11.2 g/dL — AB (ref 13.0–17.0)
Hemoglobin: 10.9 g/dL — ABNORMAL LOW (ref 13.0–17.0)
Hemoglobin: 11.2 g/dL — ABNORMAL LOW (ref 13.0–17.0)
Hemoglobin: 11.2 g/dL — ABNORMAL LOW (ref 13.0–17.0)
Hemoglobin: 13.6 g/dL (ref 13.0–17.0)
Hemoglobin: 7.8 g/dL — ABNORMAL LOW (ref 13.0–17.0)
Hemoglobin: 8.8 g/dL — ABNORMAL LOW (ref 13.0–17.0)
Hemoglobin: 11.2 g/dL — ABNORMAL LOW (ref 13.0–17.0)
Hemoglobin: 10.5 g/dL — ABNORMAL LOW (ref 13.0–17.0)
POTASSIUM: 4.5 mmol/L (ref 3.5–5.1)
POTASSIUM: 4.6 mmol/L (ref 3.5–5.1)
POTASSIUM: 4.9 mmol/L (ref 3.5–5.1)
POTASSIUM: 5.7 mmol/L — AB (ref 3.5–5.1)
Potassium: 4.2 mmol/L (ref 3.5–5.1)
Potassium: 4.5 mmol/L (ref 3.5–5.1)
Potassium: 4.9 mmol/L (ref 3.5–5.1)
Potassium: 5.1 mmol/L (ref 3.5–5.1)
Potassium: 3.9 mmol/L (ref 3.5–5.1)
Potassium: 3.5 mmol/L (ref 3.5–5.1)
Potassium: 3.6 mmol/L (ref 3.5–5.1)
Potassium: 3.9 mmol/L (ref 3.5–5.1)
SODIUM: 130 mmol/L — AB (ref 135–145)
SODIUM: 141 mmol/L (ref 135–145)
SODIUM: 141 mmol/L (ref 135–145)
Sodium: 128 mmol/L — ABNORMAL LOW (ref 135–145)
Sodium: 130 mmol/L — ABNORMAL LOW (ref 135–145)
Sodium: 131 mmol/L — ABNORMAL LOW (ref 135–145)
Sodium: 132 mmol/L — ABNORMAL LOW (ref 135–145)
Sodium: 133 mmol/L — ABNORMAL LOW (ref 135–145)
Sodium: 133 mmol/L — ABNORMAL LOW (ref 135–145)
Sodium: 136 mmol/L (ref 135–145)
Sodium: 142 mmol/L (ref 135–145)
Sodium: 130 mmol/L — ABNORMAL LOW (ref 135–145)
TCO2: 23 mmol/L (ref 0–100)
TCO2: 24 mmol/L (ref 0–100)
TCO2: 21 mmol/L (ref 0–100)
TCO2: 22 mmol/L (ref 0–100)
TCO2: 25 mmol/L (ref 0–100)
TCO2: 29 mmol/L (ref 0–100)
TCO2: 31 mmol/L (ref 0–100)
TCO2: 26 mmol/L (ref 0–100)
TCO2: 23 mmol/L (ref 0–100)
TCO2: 26 mmol/L (ref 0–100)
TCO2: 27 mmol/L (ref 0–100)
TCO2: 29 mmol/L (ref 0–100)

## 2017-03-24 LAB — PREPARE FRESH FROZEN PLASMA
UNIT DIVISION: 0
UNIT DIVISION: 0
Unit division: 0
Unit division: 0

## 2017-03-24 LAB — POCT I-STAT 4, (NA,K, GLUC, HGB,HCT)
Glucose, Bld: 140 mg/dL — ABNORMAL HIGH (ref 65–99)
HCT: 34 % — ABNORMAL LOW (ref 39.0–52.0)
HEMOGLOBIN: 11.6 g/dL — AB (ref 13.0–17.0)
POTASSIUM: 4 mmol/L (ref 3.5–5.1)
SODIUM: 140 mmol/L (ref 135–145)

## 2017-03-24 LAB — CBC
HCT: 34.6 % — ABNORMAL LOW (ref 39.0–52.0)
HCT: 36.1 % — ABNORMAL LOW (ref 39.0–52.0)
HEMATOCRIT: 34.7 % — AB (ref 39.0–52.0)
HEMOGLOBIN: 11.6 g/dL — AB (ref 13.0–17.0)
HEMOGLOBIN: 11.6 g/dL — AB (ref 13.0–17.0)
Hemoglobin: 12.3 g/dL — ABNORMAL LOW (ref 13.0–17.0)
MCH: 29.6 pg (ref 26.0–34.0)
MCH: 29.4 pg (ref 26.0–34.0)
MCH: 28.8 pg (ref 26.0–34.0)
MCHC: 33.5 g/dL (ref 30.0–36.0)
MCHC: 34.1 g/dL (ref 30.0–36.0)
MCHC: 33.4 g/dL (ref 30.0–36.0)
MCV: 88.3 fL (ref 78.0–100.0)
MCV: 86.2 fL (ref 78.0–100.0)
MCV: 86.1 fL (ref 78.0–100.0)
Platelets: 127 10*3/uL — ABNORMAL LOW (ref 150–400)
Platelets: 106 10*3/uL — ABNORMAL LOW (ref 150–400)
Platelets: 88 10*3/uL — ABNORMAL LOW (ref 150–400)
RBC: 3.92 MIL/uL — AB (ref 4.22–5.81)
RBC: 4.19 MIL/uL — ABNORMAL LOW (ref 4.22–5.81)
RBC: 4.03 MIL/uL — ABNORMAL LOW (ref 4.22–5.81)
RDW: 15 % (ref 11.5–15.5)
RDW: 14.5 % (ref 11.5–15.5)
RDW: 14.7 % (ref 11.5–15.5)
WBC: 12.6 10*3/uL — ABNORMAL HIGH (ref 4.0–10.5)
WBC: 11.8 10*3/uL — ABNORMAL HIGH (ref 4.0–10.5)
WBC: 15.6 10*3/uL — ABNORMAL HIGH (ref 4.0–10.5)

## 2017-03-24 LAB — HEMOGLOBIN AND HEMATOCRIT, BLOOD
HEMATOCRIT: 28.1 % — AB (ref 39.0–52.0)
Hemoglobin: 9.3 g/dL — ABNORMAL LOW (ref 13.0–17.0)

## 2017-03-24 LAB — BPAM FFP
BLOOD PRODUCT EXPIRATION DATE: 201808182359
BLOOD PRODUCT EXPIRATION DATE: 201808182359
Blood Product Expiration Date: 201808182359
Blood Product Expiration Date: 201808182359
ISSUE DATE / TIME: 201808150406
ISSUE DATE / TIME: 201808150406
ISSUE DATE / TIME: 201808150406
ISSUE DATE / TIME: 201808150406
UNIT TYPE AND RH: 7300
UNIT TYPE AND RH: 7300
Unit Type and Rh: 7300
Unit Type and Rh: 7300

## 2017-03-24 LAB — GLUCOSE, CAPILLARY
GLUCOSE-CAPILLARY: 110 mg/dL — AB (ref 65–99)
GLUCOSE-CAPILLARY: 106 mg/dL — AB (ref 65–99)
GLUCOSE-CAPILLARY: 94 mg/dL (ref 65–99)
Glucose-Capillary: 107 mg/dL — ABNORMAL HIGH (ref 65–99)
Glucose-Capillary: 123 mg/dL — ABNORMAL HIGH (ref 65–99)
Glucose-Capillary: 96 mg/dL (ref 65–99)
Glucose-Capillary: 100 mg/dL — ABNORMAL HIGH (ref 65–99)
Glucose-Capillary: 128 mg/dL — ABNORMAL HIGH (ref 65–99)
Glucose-Capillary: 121 mg/dL — ABNORMAL HIGH (ref 65–99)
Glucose-Capillary: 106 mg/dL — ABNORMAL HIGH (ref 65–99)
Glucose-Capillary: 102 mg/dL — ABNORMAL HIGH (ref 65–99)
Glucose-Capillary: 125 mg/dL — ABNORMAL HIGH (ref 65–99)
Glucose-Capillary: 106 mg/dL — ABNORMAL HIGH (ref 65–99)
Glucose-Capillary: 103 mg/dL — ABNORMAL HIGH (ref 65–99)

## 2017-03-24 LAB — FIBRINOGEN
FIBRINOGEN: 66 mg/dL — AB (ref 210–475)
Fibrinogen: 219 mg/dL (ref 210–475)

## 2017-03-24 LAB — DIC (DISSEMINATED INTRAVASCULAR COAGULATION) PANEL
D DIMER QUANT: 19.1 ug{FEU}/mL — AB (ref 0.00–0.50)
INR: 1.09
PROTHROMBIN TIME: 14.2 s (ref 11.4–15.2)

## 2017-03-24 LAB — PLATELET COUNT: PLATELETS: 123 10*3/uL — AB (ref 150–400)

## 2017-03-24 LAB — CREATININE, SERUM
Creatinine, Ser: 1.07 mg/dL (ref 0.61–1.24)
GFR calc Af Amer: >60 mL/min (ref >60)
GFR calc non Af Amer: >60 mL/min (ref >60)

## 2017-03-24 LAB — SURGICAL PCR SCREEN
MRSA, PCR: NEGATIVE
STAPHYLOCOCCUS AUREUS: NEGATIVE

## 2017-03-24 LAB — DIC (DISSEMINATED INTRAVASCULAR COAGULATION)PANEL
Fibrinogen: 232 mg/dL (ref 210–475)
Platelets: 102 10*3/uL — ABNORMAL LOW (ref 150–400)
Smear Review: NONE SEEN
aPTT: 52 seconds — ABNORMAL HIGH (ref 24–36)

## 2017-03-24 LAB — MAGNESIUM: MAGNESIUM: 3.1 mg/dL — AB (ref 1.7–2.4)

## 2017-03-24 MED ORDER — CHLORHEXIDINE GLUCONATE CLOTH 2 % EX PADS
6.0000 | MEDICATED_PAD | Freq: Every day | CUTANEOUS | Status: DC
  Administered 2017-03-24 – 2017-03-30 (×7): 6 via TOPICAL

## 2017-03-24 MED ORDER — CALCIUM CHLORIDE 10 % IV SOLN
INTRAVENOUS | Status: DC | PRN
  Administered 2017-03-24: 0.5 g via INTRAVENOUS
  Administered 2017-03-24 (×2): .25 g via INTRAVENOUS
  Administered 2017-03-24: 1 g via INTRAVENOUS

## 2017-03-24 MED ORDER — SODIUM CHLORIDE 0.9% FLUSH
10.0000 mL | Freq: Two times a day (BID) | INTRAVENOUS | Status: DC
  Administered 2017-03-24 – 2017-03-30 (×10): 10 mL

## 2017-03-24 MED ORDER — NOREPINEPHRINE BITARTRATE 1 MG/ML IV SOLN
0.0000 ug/min | INTRAVENOUS | Status: DC
  Filled 2017-03-24: qty 4

## 2017-03-24 MED ORDER — MIDAZOLAM HCL 2 MG/2ML IJ SOLN: 2.0000 mg | INTRAMUSCULAR | Status: DC | PRN

## 2017-03-24 MED ORDER — SODIUM CHLORIDE 0.45 % IV SOLN: INTRAVENOUS | Status: DC | PRN

## 2017-03-24 MED ORDER — LEVOFLOXACIN IN D5W 750 MG/150ML IV SOLN
750.0000 mg | INTRAVENOUS | Status: AC
  Administered 2017-03-24: 750 mg via INTRAVENOUS
  Filled 2017-03-24: qty 150

## 2017-03-24 MED ORDER — DOPAMINE-DEXTROSE 3.2-5 MG/ML-% IV SOLN: 0.0000 ug/kg/min | INTRAVENOUS | Status: DC

## 2017-03-24 MED ORDER — SODIUM CHLORIDE 0.9% FLUSH: 3.0000 mL | INTRAVENOUS | Status: DC | PRN

## 2017-03-24 MED ORDER — ACETAMINOPHEN 160 MG/5ML PO SOLN
1000.0000 mg | Freq: Four times a day (QID) | ORAL | Status: DC
  Administered 2017-03-24: 1000 mg
  Filled 2017-03-24: qty 40.6

## 2017-03-24 MED ORDER — VASOPRESSIN 20 UNIT/ML IV SOLN
INTRAVENOUS | Status: DC | PRN
  Administered 2017-03-24: .03 [IU]/min via INTRAVENOUS

## 2017-03-24 MED ORDER — SODIUM CHLORIDE 0.9 % IV SOLN: INTRAVENOUS | Status: DC

## 2017-03-24 MED ORDER — ASPIRIN 81 MG PO CHEW: 324.0000 mg | CHEWABLE_TABLET | Freq: Every day | ORAL | Status: DC

## 2017-03-24 MED ORDER — CHLORHEXIDINE GLUCONATE 0.12 % MT SOLN
15.0000 mL | OROMUCOSAL | Status: AC
  Administered 2017-03-24: 15 mL via OROMUCOSAL

## 2017-03-24 MED ORDER — HEMOSTATIC AGENTS (NO CHARGE) OPTIME
TOPICAL | Status: DC | PRN
  Administered 2017-03-24: 1 via TOPICAL

## 2017-03-24 MED ORDER — PROTAMINE SULFATE 10 MG/ML IV SOLN
INTRAVENOUS | Status: DC | PRN
  Administered 2017-03-24: 300 mg via INTRAVENOUS

## 2017-03-24 MED ORDER — SODIUM CHLORIDE 0.9 % IV SOLN: Freq: Once | INTRAVENOUS | Status: DC

## 2017-03-24 MED ORDER — SODIUM CHLORIDE 0.9% FLUSH
3.0000 mL | Freq: Two times a day (BID) | INTRAVENOUS | Status: DC
  Administered 2017-03-25 – 2017-03-30 (×5): 3 mL via INTRAVENOUS

## 2017-03-24 MED ORDER — SODIUM CHLORIDE 0.9 % IV SOLN
INTRAVENOUS | Status: DC
  Administered 2017-03-24: 20 mL/h via INTRAVENOUS

## 2017-03-24 MED ORDER — ACETAMINOPHEN 500 MG PO TABS
1000.0000 mg | ORAL_TABLET | Freq: Four times a day (QID) | ORAL | Status: AC
  Administered 2017-03-26 (×2): 1000 mg via ORAL
  Filled 2017-03-24 (×4): qty 2

## 2017-03-24 MED ORDER — LACTATED RINGERS IV SOLN: INTRAVENOUS | Status: DC

## 2017-03-24 MED ORDER — EPINEPHRINE PF 1 MG/ML IJ SOLN
0.0000 ug/min | INTRAMUSCULAR | Status: DC
  Filled 2017-03-24: qty 4

## 2017-03-24 MED ORDER — FAMOTIDINE IN NACL 20-0.9 MG/50ML-% IV SOLN
20.0000 mg | Freq: Two times a day (BID) | INTRAVENOUS | Status: AC
  Administered 2017-03-24 (×2): 20 mg via INTRAVENOUS
  Filled 2017-03-24 (×2): qty 50

## 2017-03-24 MED ORDER — MORPHINE SULFATE (PF) 2 MG/ML IV SOLN: 1.0000 mg | INTRAVENOUS | Status: DC | PRN

## 2017-03-24 MED ORDER — VASOPRESSIN 20 UNIT/ML IV SOLN
0.0300 [IU]/min | INTRAVENOUS | Status: DC
  Filled 2017-03-24: qty 2

## 2017-03-24 MED ORDER — ACETAMINOPHEN 160 MG/5ML PO SOLN: 650.0000 mg | Freq: Once | ORAL | Status: AC

## 2017-03-24 MED ORDER — LACTATED RINGERS IV SOLN: 500.0000 mL | Freq: Once | INTRAVENOUS | Status: DC | PRN

## 2017-03-24 MED ORDER — ALBUMIN HUMAN 5 % IV SOLN
INTRAVENOUS | Status: DC | PRN
  Administered 2017-03-24 (×3): via INTRAVENOUS

## 2017-03-24 MED ORDER — NITROGLYCERIN IN D5W 200-5 MCG/ML-% IV SOLN
0.0000 ug/min | INTRAVENOUS | Status: DC
Start: 2017-03-24 — End: 2017-03-25
  Filled 2017-03-24: qty 250

## 2017-03-24 MED ORDER — BISACODYL 5 MG PO TBEC
10.0000 mg | DELAYED_RELEASE_TABLET | Freq: Every day | ORAL | Status: DC
  Administered 2017-03-25 – 2017-04-08 (×12): 10 mg via ORAL
  Filled 2017-03-24 (×13): qty 2

## 2017-03-24 MED ORDER — PANTOPRAZOLE SODIUM 40 MG PO TBEC
40.0000 mg | DELAYED_RELEASE_TABLET | Freq: Every day | ORAL | Status: DC
  Administered 2017-03-26 – 2017-04-07 (×13): 40 mg via ORAL
  Filled 2017-03-24 (×13): qty 1

## 2017-03-24 MED ORDER — ASPIRIN EC 325 MG PO TBEC
325.0000 mg | DELAYED_RELEASE_TABLET | Freq: Every day | ORAL | Status: DC
  Administered 2017-03-25 – 2017-04-09 (×16): 325 mg via ORAL
  Filled 2017-03-24 (×16): qty 1

## 2017-03-24 MED ORDER — INSULIN ASPART 100 UNIT/ML ~~LOC~~ SOLN
0.0000 [IU] | SUBCUTANEOUS | Status: DC
  Administered 2017-03-25 – 2017-03-26 (×3): 2 [IU] via SUBCUTANEOUS

## 2017-03-24 MED ORDER — COAGULATION FACTOR VIIA RECOMB 1 MG IV SOLR
90.0000 ug/kg | Freq: Once | INTRAVENOUS | Status: AC
  Administered 2017-03-24: 8000 ug via INTRAVENOUS
  Filled 2017-03-24: qty 8

## 2017-03-24 MED ORDER — DOCUSATE SODIUM 100 MG PO CAPS
200.0000 mg | ORAL_CAPSULE | Freq: Every day | ORAL | Status: DC
  Administered 2017-03-25 – 2017-04-09 (×13): 200 mg via ORAL
  Filled 2017-03-24 (×13): qty 2

## 2017-03-24 MED ORDER — THROMBIN 20000 UNITS EX SOLR
CUTANEOUS | Status: AC
  Filled 2017-03-24: qty 20000

## 2017-03-24 MED ORDER — SODIUM BICARBONATE 8.4 % IV SOLN
INTRAVENOUS | Status: DC | PRN
  Administered 2017-03-24 (×4): 50 meq via INTRAVENOUS

## 2017-03-24 MED ORDER — ONDANSETRON HCL 4 MG/2ML IJ SOLN
4.0000 mg | Freq: Four times a day (QID) | INTRAMUSCULAR | Status: DC | PRN
  Administered 2017-03-26: 4 mg via INTRAVENOUS
  Filled 2017-03-24: qty 2

## 2017-03-24 MED ORDER — MAGNESIUM SULFATE 4 GM/100ML IV SOLN
4.0000 g | Freq: Once | INTRAVENOUS | Status: AC
Start: 2017-03-24 — End: 2017-03-24
  Administered 2017-03-24: 4 g via INTRAVENOUS
  Filled 2017-03-24: qty 100

## 2017-03-24 MED ORDER — OXYCODONE HCL 5 MG PO TABS
5.0000 mg | ORAL_TABLET | ORAL | Status: DC | PRN
  Administered 2017-03-25 – 2017-03-26 (×2): 10 mg via ORAL
  Administered 2017-03-28 – 2017-03-30 (×3): 5 mg via ORAL
  Administered 2017-04-02 – 2017-04-05 (×3): 10 mg via ORAL
  Filled 2017-03-24 (×4): qty 2
  Filled 2017-03-24 (×2): qty 1
  Filled 2017-03-24 (×3): qty 2
  Filled 2017-03-24: qty 1

## 2017-03-24 MED ORDER — SODIUM CHLORIDE 0.9 % IV SOLN
250.0000 mL | INTRAVENOUS | Status: DC
  Administered 2017-03-25: 250 mL via INTRAVENOUS

## 2017-03-24 MED ORDER — LEVALBUTEROL HCL 0.63 MG/3ML IN NEBU
0.6300 mg | INHALATION_SOLUTION | Freq: Four times a day (QID) | RESPIRATORY_TRACT | Status: DC
  Administered 2017-03-24 – 2017-03-25 (×5): 0.63 mg via RESPIRATORY_TRACT
  Filled 2017-03-24 (×5): qty 3

## 2017-03-24 MED ORDER — SODIUM CHLORIDE 0.9 % IV SOLN
0.0000 ug/kg/h | INTRAVENOUS | Status: DC
  Administered 2017-03-24: 0.3 ug/kg/h via INTRAVENOUS
  Filled 2017-03-24: qty 2

## 2017-03-24 MED ORDER — EPINEPHRINE PF 1 MG/ML IJ SOLN
INTRAMUSCULAR | Status: DC | PRN
  Administered 2017-03-24: .5 mg via INTRAVENOUS
  Administered 2017-03-24: 1 mg via INTRAVENOUS
  Administered 2017-03-24 (×2): .2 mg via INTRAVENOUS
  Administered 2017-03-24: .1 mg via INTRAVENOUS

## 2017-03-24 MED ORDER — SODIUM CHLORIDE 0.9% FLUSH: 10.0000 mL | INTRAVENOUS | Status: DC | PRN

## 2017-03-24 MED ORDER — INSULIN REGULAR BOLUS VIA INFUSION
0.0000 [IU] | Freq: Three times a day (TID) | INTRAVENOUS | Status: DC
  Filled 2017-03-24: qty 10

## 2017-03-24 MED ORDER — POTASSIUM CHLORIDE 10 MEQ/50ML IV SOLN: 10.0000 meq | INTRAVENOUS | Status: AC

## 2017-03-24 MED ORDER — CHLORHEXIDINE GLUCONATE 0.12% ORAL RINSE (MEDLINE KIT)
15.0000 mL | Freq: Two times a day (BID) | OROMUCOSAL | Status: DC
  Administered 2017-03-24 – 2017-03-25 (×3): 15 mL via OROMUCOSAL

## 2017-03-24 MED ORDER — VANCOMYCIN HCL IN DEXTROSE 1-5 GM/200ML-% IV SOLN
1000.0000 mg | Freq: Once | INTRAVENOUS | Status: AC
  Administered 2017-03-24: 1000 mg via INTRAVENOUS
  Filled 2017-03-24: qty 200

## 2017-03-24 MED ORDER — BISACODYL 10 MG RE SUPP: 10.0000 mg | Freq: Every day | RECTAL | Status: DC

## 2017-03-24 MED ORDER — MORPHINE SULFATE (PF) 2 MG/ML IV SOLN: 1.0000 mg | INTRAVENOUS | Status: AC | PRN

## 2017-03-24 MED ORDER — MORPHINE SULFATE (PF) 4 MG/ML IV SOLN
1.0000 mg | INTRAVENOUS | Status: DC | PRN
  Administered 2017-03-24 – 2017-03-27 (×9): 2 mg via INTRAVENOUS
  Filled 2017-03-24 (×10): qty 1

## 2017-03-24 MED ORDER — POTASSIUM CHLORIDE 10 MEQ/50ML IV SOLN
10.0000 meq | INTRAVENOUS | Status: AC
  Administered 2017-03-24 (×3): 10 meq via INTRAVENOUS

## 2017-03-24 MED ORDER — ORAL CARE MOUTH RINSE
15.0000 mL | Freq: Four times a day (QID) | OROMUCOSAL | Status: DC
  Administered 2017-03-24 – 2017-03-25 (×3): 15 mL via OROMUCOSAL

## 2017-03-24 MED ORDER — FUROSEMIDE 10 MG/ML IJ SOLN
20.0000 mg | Freq: Once | INTRAMUSCULAR | Status: AC
  Administered 2017-03-24: 20 mg via INTRAVENOUS
  Filled 2017-03-24: qty 2

## 2017-03-24 MED ORDER — EPINEPHRINE PF 1 MG/ML IJ SOLN
INTRAMUSCULAR | Status: AC
  Filled 2017-03-24: qty 1

## 2017-03-24 MED ORDER — TRAMADOL HCL 50 MG PO TABS
50.0000 mg | ORAL_TABLET | ORAL | Status: DC | PRN
  Filled 2017-03-24: qty 1

## 2017-03-24 MED ORDER — SODIUM CHLORIDE 0.9 % IJ SOLN
INTRAMUSCULAR | Status: DC | PRN
  Administered 2017-03-24: 04:00:00

## 2017-03-24 MED ORDER — MIDAZOLAM HCL 5 MG/5ML IJ SOLN
INTRAMUSCULAR | Status: DC | PRN
  Administered 2017-03-23 – 2017-03-24 (×2): 5 mg via INTRAVENOUS

## 2017-03-24 MED ORDER — SODIUM CHLORIDE 0.9 % IV SOLN
0.0000 ug/min | INTRAVENOUS | Status: DC
  Administered 2017-03-24: 25 ug/min via INTRAVENOUS
  Filled 2017-03-24: qty 2

## 2017-03-24 MED ORDER — SODIUM CHLORIDE 0.9 % IV SOLN
INTRAVENOUS | Status: AC
  Administered 2017-03-24: 07:00:00 via INTRAVENOUS

## 2017-03-24 MED ORDER — ACETAMINOPHEN 650 MG RE SUPP
650.0000 mg | Freq: Once | RECTAL | Status: AC
  Administered 2017-03-24: 650 mg via RECTAL

## 2017-03-24 MED ORDER — ALBUMIN HUMAN 5 % IV SOLN
250.0000 mL | INTRAVENOUS | Status: AC | PRN
  Administered 2017-03-24 (×4): 250 mL via INTRAVENOUS
  Filled 2017-03-24 (×2): qty 250

## 2017-03-24 MED FILL — Electrolyte-R (PH 7.4) Solution: INTRAVENOUS | Qty: 6000 | Status: AC

## 2017-03-24 MED FILL — Potassium Chloride Inj 2 mEq/ML: INTRAVENOUS | Qty: 40 | Status: AC

## 2017-03-24 MED FILL — Magnesium Sulfate Inj 50%: INTRAMUSCULAR | Qty: 10 | Status: AC

## 2017-03-24 MED FILL — Lidocaine HCl IV Inj 20 MG/ML: INTRAVENOUS | Qty: 25 | Status: AC

## 2017-03-24 MED FILL — Sodium Bicarbonate IV Soln 8.4%: INTRAVENOUS | Qty: 200 | Status: AC

## 2017-03-24 MED FILL — Heparin Sodium (Porcine) Inj 1000 Unit/ML: INTRAMUSCULAR | Qty: 30 | Status: AC

## 2017-03-24 MED FILL — Mannitol IV Soln 20%: INTRAVENOUS | Qty: 500 | Status: AC

## 2017-03-24 MED FILL — Heparin Sodium (Porcine) Inj 1000 Unit/ML: INTRAMUSCULAR | Qty: 10 | Status: AC

## 2017-03-24 MED FILL — Calcium Chloride Inj 10%: INTRAVENOUS | Qty: 10 | Status: AC

## 2017-03-24 MED FILL — Electrolyte-R (PH 7.4) Solution: INTRAVENOUS | Qty: 7000 | Status: AC

## 2017-03-24 MED FILL — Potassium Chloride Inj 2 mEq/ML: INTRAVENOUS | Qty: 5 | Status: AC

## 2017-03-24 NOTE — Op Note (Signed)
CARDIOTHORACIC SURGERY OPERATIVE NOTE  Date of Procedure:  03/24/2017  Preoperative Diagnosis: Acute Type A Aortic Dissection   Postoperative Diagnosis: Same   Procedure:    Emergency Repair of Aortic Dissection   Right axillary artery cannulation  Deep hypothermia and total circulatory arrest with continuous antegrade cerebral perfusion  Resection and grafting of ascending aorta, transverse aortic arch and proximal descending thoracic aorta  Elephant trunk distal anastomosis  Debranching of aortic arch vessles    Biological Bentall Aortic Root Replacement  Edwards Magna Ease Pericardial Tissue Valve (size 25 mm, model # 3300TFX, serial # [id number])  Gelweave Valsalva Aortic Root Graft (size 28 mm, ref # [id number], serial # [id number])  Reimplantation of Left Main and Right Coronary Arteries   Surgeon: Valentina Gu. 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 He initially had shortness of breath but this has improved. He denies any abdominal pain. He denies any visual disturbances or numbness and/or weakness involving either upper or lower extremity. Blood pressure has been moderately elevated or normal in the emergency department but stable. CT angiogram of the chest, abdomen, and  pelvis demonstrates acute type A aortic dissection. Cardiothoracic surgical consultation was requested.  The patient has been seen in consultation in the emergency department and counseled at length regarding the indications, risks and potential benefits of surgery.  All questions have been answered, and the patient provides full informed consent for the operation as described.    DETAILS OF THE OPERATIVE PROCEDURE  Preparation:  The patient is brought to the operating room on the above mentioned date and central monitoring was established by the anesthesia team including placement of Swan-Ganz catheter and left radial arterial line. The patient is placed in the supine position on the operating table.  Intravenous antibiotics are administered. General endotracheal anesthesia is induced uneventfully. A Foley catheter is placed.  The patient remained hemodynamically stable throughout the entire phase of induction.  Baseline transesophageal echocardiogram was performed.  Findings were notable for an obvious aneurysm of the aortic root and ascending thoracic aorta with aortic dissection flap extending from the level of the aortic valve itself all way around the entire aortic arch and throughout the descending thoracic aorta. Aortic root was dilated. The dissection flap extended circumferentially down to the level of the aortic annulus, but remarkably there was only mild aortic insufficiency. The aortic valve appeared to be trileaflet. There was normal left ventricular systolic function. There was trivial pericardial effusion. No other significant abnormalities were noted.  The patient's chest, abdomen, both groins, and both lower extremities are prepared and draped in a sterile manner. A time out procedure is performed.   Surgical Approach:  A median sternotomy incision was performed and the pericardium is opened. There is trivial pericardial fluid. The ascending aorta is dilated and acutely dissected  throughout.   Extracorporeal Cardiopulmonary  Bypass and Myocardial Protection:  An oblique incision is made in the right deltopectoral groove. The pectoralis major muscle is retracted inferiorly and the deep pectoralis fashion in size. The pectoralis minor muscle is retracted laterally. The right axillary artery is identified and carefully dissected from nearby structures using only sharp dissection with care to avoid manipulation of the brachial plexus. The patient is heparinized systemically.  An 8 mm Gore-Tex graft is sewn to the right axillary artery in end to side fashion to be utilized for arterial inflow for cardiopulmonary bypass.  The right atrium is cannulated using a standard 2 stage cannula. Adequate heparinization is verified.   A retrograde cardioplegia cannula is placed through the right atrium into the coronary sinus.  The operative field was continuously flooded with carbon dioxide gas.  The entire pre-bypass portion of the operation was notable for stable hemodynamics.  Cardiopulmonary bypass was begun.  A left ventricular vent is placed through the right superior pulmonary vein. Dissection is continued proximally over the innominate vein, the innominate artery, and the aortic arch. The ascending aorta is dissected away from the pulmonary artery. The aortic dissection came be visually seen to extend throughout the aortic arch, the innominate artery, and the left common carotid artery. The thin-walled dissection overlying the left common carotid artery ruptures and begins to bleed spontaneously. Manual digital pressure is utilized for control. A temperature probe was placed in the interventricular septum.  The patient is cooled to 18C systemic temperature.  The aortic cross clamp is applied across the ascending aorta and cardioplegia is delivered retrograde fashion through the coronary sinus catheter using modified [place] cold blood cardioplegia ([place] blood cardioplegia  protocol).   The initial cardioplegic arrest is rapid with early diastolic arrest.  Repeat doses of cardioplegia are administered at 90 minutes and every 30 minutes thereafter through the coronary sinus catheter in order to maintain completely flat electrocardiogram.  Myocardial protection was felt to be excellent.   Biological Bentall Aortic Root Replacement:  The ascending aorta is transected just below the aortic cross-clamp. There is acute dissection with circumferential false lumen surrounding the true lumen. The proximal aorta is resected back to expose the aortic valve and aortic root.  The dissection continues down to the level of the aortic annulus throughout the non coronary sinus of Valsalva as well as beneath the origin of the left main coronary artery and the right coronary artery. The ostium of the left main and the right coronary arteries are not themselves dissected.  The aortic annulus is dilated. Aortic valve is trileaflet. Because of the extensive dissection down to the level of the aortic annulus and the aneurysmal enlargement of the aortic root, aortic root replacement is felt indicated.    Aortic valve leaflets are excised sharply. The aortic annulus is not calcified. The left main and the right coronary arteries are each mobilized on separate buttons of aortic tissue. Dissection in the aortic root is tedious with significant chronic inflammation, suggesting the presence of pre-existing aneurysm prior to the development of the acute dissection.  The aortic annulus sized to accept a 25 mm stented bioprosthetic tissue valve.   An [facility] Ease pericardial tissue valve (size 25 mm, model # 3300TFX, serial # [id number]) was implanted inside of the proximal end of a Gelweave Valsalva Aortic Root Graft (size 28 mm, ref # [id number], serial # [id number]) after trimming a few of the rings of the straight proximal segment of the graft.  The valve is tacked in  place within the graft using a  total of 3 simple 4-0 Prolene sutures.  Aortic root replacement was performed using interrupted horizontal mattress 2-0 Ethibond pledgeted sutures with pledgets in the supra-annular position.  The left main and the right coronary artery are each reimplanted into the corresponding sinus of Valsalva portion of the aortic root graft after creating circular holes in the root graft using thermal cautery using running 5-0 Prolene suture.   Repair of Aortic Dissection:  By this time the patient's core temperature has reached 18C.  High-dose steroids and etomidate are given by the anesthesia team.  The patient is placed in Trendelenburg's position and cardiopulmonary bypass temporarily discontinued and the patient's blood volume exsanguinated into the pump. The aortic cross clamp is removed. The distal ascending aorta and transverse aortic arch are carefully examined and subsequently dissected away from associated structures. The dissection extends throughout the entire aortic arch with a large reentry tear noted along the cephalad surface of the arch between the left common carotid artery and the left subclavian artery. Dissection extends into the left common carotid artery as well as throughout the innominate artery. Total arch replacement is felt mandatory under the circumstances. The entire transverse aortic arch is excised including the first several centimeters of the proximal descending thoracic aorta beyond the level of the distal reentry tear. The proximal end of the innominate artery is trimmed back and a cross-clamp applied below the level of the bifurcation of the innominate artery. Low flow antegrade cerebral perfusion is commenced after a total circulatory arrest time of approximately 23 minutes.  The origin of left common carotid artery and the left subclavian artery are dissected back to adequate tissues. This is technically challenging. Because of the friable tissue and the dissection extending up  the left common carotid artery, application of small clamps to these vessels is avoided and #6 Fogarty embolectomy catheters passed into the lumen for hemostasis. The descending thoracic aorta is examined and notably completely dissected circumferentially. Elephant trunk distal aortic reconstruction is felt indicated.  Of note, the proximal descending thoracic aorta is normal caliber, measuring slightly more than 2 cm in diameter.  Initially a 20 mm Hemashield gold straight graft is invaginated on itself to be utilized for distal aortic reconstruction. However, this graft is slightly too large to fit within the lumen. Subsequently an 18 mm Hemashield gold straight graft is invaginated on itself and gently tucked into the proximal descending thoracic aorta. The distal anastomosis is constructed using running 3-0 Prolene suture circumferentially around the graft. The proximal end of the graft is then pulled back and additional interrupted 3-0 Prolene pledgeted sutures are placed circumferentially around the anastomosis for added hemostasis.  Debranching of the aortic arch vessels is performed then reconstructed using a 14 x 10 x 10 mm trifurcated vascular graft.  The distal 10 mm limb is trimmed and beveled to an appropriate length and sewn to the left subclavian artery in an end-to-end fashion with running 4-0 Prolene suture. The middle 10 mm limb is trimmed and beveled to an appropriate length and sewn to the proximal end of the left common carotid artery in an end-to-end fashion with running 5-0 Prolene suture. The proximal 14 mm limb of the graft is trimmed and beveled to an appropriate length and sewn directly to the innominate artery in end-to-end fashion with running 5-0 Prolene suture.  A clamp is placed across the proximal end of the 14 x 10 x 10 mm trifurcated graft and antegrade perfusion  restored to all of the aortic arch vessels.  The proximal end of the 18 mm distal aortic graft and the distal end  of the 28 mm aortic root graft are trimmed and beveled to appropriate length and sewn to each other in end-to-end fashion with running 4-0 Prolene suture. The proximal end of the trifurcated 14 x 10 x 10 trifurcated vascular graft is trimmed and beveled and sewn to the dorsal surface of the 28 mm aortic root graft after creating a circular defect in the graft using thermal cautery.  All air was evacuated through the aortic graft prior to completion of the closure.  The aortic cross clamp was removed after a total cross clamp time of 259 minutes and total circulatory arrest time of 145 minutes, of which 122 minutes were with continuous antegrade cerebral perfusion.   Procedure Completion:  Rewarming was commenced. All of the vascular anastomoses were carefully inspected for hemostasis and additional pledgeted sutures placed as needed. The heart begins to be spontaneously without cardioversion.  Epicardial pacing wires are fixed to the right ventricular outflow tract and to the right atrial appendage. The patient is rewarmed to 37C temperature. The left ventricular vent is removed.  The patient is weaned and disconnected from cardiopulmonary bypass.  The patient's rhythm at separation from bypass was sinus.  The patient was weaned from cardiopulmonary bypass initially without any inotropic support. Total cardiopulmonary bypass time for the operation was 403 minutes.  Followup transesophageal echocardiogram performed after separation from bypass revealed a well-seated aortic valve prosthesis that was functioning normally and without any sign of aortic insuffiency.  Left ventricular function was unchanged from preoperatively.  After an early period of relative hemodynamic stability, the patient became somewhat labile with periods of both hypertension and hypotension. Transesophageal echocardiogram suggests underfilling of the left ventricle and volume resuscitation is commenced. The patient suffers a brief  period of prolonged hypotension requiring resuscitation using intravenous epinephrine. Eventually with correction of hypovolemia and metabolic acidosis, the patient's hemodynamics stabilized.  Low-dose epinephrine, vasopressin, dopamine, and Neo-Synephrine are utilized for blood pressure support.  Protamine was administered to reverse the anticoagulation.  The patient received a total of 2 packs adult platelets, 4 units fresh frozen plasma, and 3 packs cryoprecipitate due to coagulopathy and thrombocytopenia after separation from cardiopulmonary bypass and reversal of heparin with protamine.  An exhaustive search for signs of mechanical bleeding is entertained. Ultimately the patient is administered a single dose of recombinant human factor VII. Eventually the patient's coagulopathy improves.  The mediastinum and both pleural spaces were drained using 4 chest tubes placed through separate stab incisions inferiorly.  The soft tissues anterior to the aortic graft could not be re approximated over the aortic graft. The sternum is closed with double strength sternal wire. The soft tissues anterior to the sternum were closed in multiple layers and the skin is closed with a running subcuticular skin closure.  The post-bypass portion of the operation was notable for stable rhythm and hemodynamics.    Disposition:  The patient tolerated the procedure well and is transported to the surgical intensive care in stable condition. There are no intraoperative complications. All sponge instrument and needle counts are verified correct at completion of the operation.    Valentina Gu. Roxy Manns MD 03/24/2017 5:26 AM

## 2017-03-24 NOTE — Progress Notes (Signed)
[address]       [place] [address]             [phone]        CARDIOTHORACIC SURGERY PROGRESS NOTE   R1 Day Post-Op Procedure(s) (LRB): REPAIR OF ACUTE ASCENDING THORACIC AORTIC DISSECTION.  Bentall procedure.  Aortic root repleacement with bioprosthetic valve.  Reimplantation of left and right coronary arteries.  Total resection of transverse aortic arch.  Elephant trunk distal anastomosis and debranching of arch vessels. (N/A) TRANSESOPHAGEAL ECHOCARDIOGRAM (TEE) (N/A)  Subjective: Sedated on vent  Objective: Vital signs: BP Readings from Last 1 Encounters:  03/24/17 99/69   Pulse Readings from Last 1 Encounters:  03/24/17 79   Resp Readings from Last 1 Encounters:  03/24/17 14   Temp Readings from Last 1 Encounters:  03/24/17 98.8 F (37.1 C)    Hemodynamics: PAP: (17-36)/(7-24) 24/15 CO:  [4.2 L/min-5.1 L/min] 4.2 L/min CI:  [2 L/min/m2-2.4 L/min/m2] 2 L/min/m2  Physical Exam:  Rhythm:   sinus  Breath sounds: Coarse but clear  Heart sounds:  RRR  Incisions:  Dressings dry, intact  Abdomen:  Soft, non-distended  Extremities:  Warm, well-perfused  Chest tubes:  low volume output, no air leak    Intake/Output from previous day: 08/14 0701 - 08/15 0700 In: 8915.3 [I.V.:3911.3; VVOHY:0737; NG/GT:30; IV Piggyback:1300] Out: 1062 [IRSWN:4627; Blood:3450; Chest Tube:120] Intake/Output this shift: Total I/O In: 733.6 [I.V.:403.6; NG/GT:30; IV Piggyback:300] Out: 600 [Urine:475; Emesis/NG output:50; Chest Tube:75]  Lab Results:  CBC: Recent Labs  03/24/17 0450 03/24/17 0603 03/24/17 0621  WBC 12.6*  --  11.8*  HGB 11.6* 11.6* 12.3*  HCT 34.6* 34.0* 36.1*  PLT 127*  --  106*  102*    BMET:  Recent Labs  03/23/17 1602 03/23/17 1806  03/24/17 0359 03/24/17 0441 03/24/17 0603  NA 131* 129*  < > 136 142 140  K 4.0 4.3  < > 3.9 3.5 4.0  CL 99* 97*  < > 96* 95*  --   CO2 23 23  --   --   --   --   GLUCOSE 119* 112*   < > 204* 178* 140*  BUN 8 10  < > 11 11  --   CREATININE 1.20 1.25*  < > 1.00 0.90  --   CALCIUM 8.5* 8.2*  --   --   --   --   < > = values in this interval not displayed.   PT/INR:   Recent Labs  03/24/17 0621  LABPROT 14.2  INR 1.09    CBG (last 3)   Recent Labs  03/24/17 0713 03/24/17 0825 03/24/17 1001  GLUCAP 107* 123* 96    ABG    Component Value Date/Time   PHART 7.357 03/24/2017 0850   PCO2ART 50.4 (H) 03/24/2017 0850   PO2ART 79.0 (L) 03/24/2017 0850   HCO3 28.2 (H) 03/24/2017 0850   TCO2 30 03/24/2017 0850   ACIDBASEDEF 2.0 03/24/2017 0426   O2SAT 95.0 03/24/2017 0850    CXR: PORTABLE CHEST 1 VIEW  COMPARISON:  Chest radiograph and CTA of the chest performed 03/23/2017  FINDINGS: The patient's endotracheal tube is seen ending 2-3 cm above the carina. An enteric tube is noted ending overlying the body of the stomach, with the side port about the gastroesophageal junction.  A right IJ Swan-Ganz catheter is noted coiling at the right main pulmonary artery and pulmonary outflow tract. A right basilar chest tube is noted. A  mediastinal drain is seen.  Mild vascular congestion is noted. Mild bilateral atelectasis is seen. No significant pleural effusion or pneumothorax is identified.  The cardiomediastinal silhouette is mildly enlarged, with prominence of the superior mediastinum, reflecting the patient's known aortic dissection. The patient is status post median sternotomy. No acute osseous abnormality seen.  IMPRESSION: 1. Endotracheal tube seen ending 2-3 cm above the carina. 2. Right IJ Swan-Ganz catheter noted coiling at the right main pulmonary artery and pulmonary outflow tract. This could be retracted approximately 8 cm, as deemed clinically appropriate. 3. Mild vascular congestion and mild cardiomegaly. Mild bilateral atelectasis seen. These results were called by telephone at the time of interpretation on 03/24/2017 at 7:00 am to  Nursing on [facility], who verbally acknowledged these results.   Electronically Signed   By: Garald Balding M.D.   On: 03/24/2017 07:04  Assessment/Plan: S/P Procedure(s) (LRB): REPAIR OF ACUTE ASCENDING THORACIC AORTIC DISSECTION.  Bentall procedure.  Aortic root repleacement with bioprosthetic valve.  Reimplantation of left and right coronary arteries.  Total resection of transverse aortic arch.  Elephant trunk distal anastomosis and debranching of arch vessels. (N/A) TRANSESOPHAGEAL ECHOCARDIOGRAM (TEE) (N/A)  Overall remarkably stable early postop Maintaining NSR w/ stable hemodynamics off all drips except Neo and low dose dopamine, PA pressures low O2 sats 100% on 50% FiO2 Chest tube output low   Slowly wean sedation and assess neuro status  Routine early postop  Rexene Alberts, MD 03/24/2017 10:35 AM

## 2017-03-24 NOTE — Procedures (Signed)
Extubation Procedure Note  Patient Details:   Name: Sean Riley DOB: [birth year] MRN: [id number]   Airway Documentation:     Evaluation  O2 sats: stable throughout Complications: No apparent complications Patient did tolerate procedure well. Bilateral Breath Sounds: Rhonchi   Yes pt able to vocalize.  Pt extubated per Rapid wean protocol. No complications. Pt was able to breathe around deflated cuff. Pt had Nif of greater than -40 cm H20, and VC of 1.2L. No stridor noted. Pt had strong, adequate cough. IS performed and 270mL performed x5.  Irineo Axon [facility] 03/24/2017, 4:24 PM

## 2017-03-24 NOTE — Brief Op Note (Addendum)
03/23/2017  3:27 AM  PATIENT:  Sean Riley  66 y.o. male  PRE-OPERATIVE DIAGNOSIS:  ACUTE TYPE A AORTIC DISSECTION  POST-OPERATIVE DIAGNOSIS:  ACUTE TYPE A AORTIC DISSECTION  PROCEDURE:TRANSESOPHAGEAL ECHOCARDIOGRAM (TEE), EMERGENT MEDIAN STERNOTOMY for  REPAIR OF ACUTE ASCENDING THORACIC AORTIC DISSECTION, AORTIC ROOT REPLACEMENT with AVR (Pericardial tissue valve, model 3300TFX, serial [id number], size 25), REIMPLANTATION of CORONARY ARTERIES-LEFT MAIN AND RIGHT CORONARY ARTERY, TOTAL RESECTION OF AORTIC ARCH using an ELEPHANT TRUNK DISTAL ANASTOMOSIS and DEBRANCHING of the the [facility] VESSELS  SURGEON:  Surgeon(s) and Role: Panel 1:    Rexene Alberts, MD - Primary  Panel 2:    Rexene Alberts, MD - Primary  PHYSICIAN ASSISTANT: Lars Pinks PA-C  ASSISTANTS: Sharee Pimple RNFA   ANESTHESIA:   general  EBL:  Total I/O In: 36 [I.V.:2100; IV Piggyback:500] Out: 9471 [Urine:1725]  BLOOD ADMINISTERED:Four FFP and two PLTS  DRAINS: Chest tubes placed in the mediastinal and pleural spaces   SPECIMEN:  Source of Specimen:  Thoracic aorta and aneurysm and native aortic valve leaflets  DISPOSITION OF SPECIMEN:  PATHOLOGY  COUNTS CORRECT:  YES  DICTATION: .Dragon Dictation  PLAN OF CARE: Admit to inpatient   PATIENT DISPOSITION:  ICU - intubated and critically ill.   Delay start of Pharmacological VTE agent (>24hrs) due to surgical blood loss or risk of bleeding: yes  BASELINE WEIGHT: 88 kg   Rexene Alberts, MD 03/24/2017 5:26 AM

## 2017-03-24 NOTE — OR Nursing (Signed)
2nd call to SICU charge  

## 2017-03-24 NOTE — OR Nursing (Signed)
1st call to SICU charge [phone].

## 2017-03-24 NOTE — Progress Notes (Signed)
CT surgery p.m. Rounds  Patient extubated late this afternoon maintaining O2 sats and adequate blood   gas at 7 PM Neuro intact Stable hemodynamics ow-dose dopamine and neo- Appears edematous will dose one  time Lasix 20 mg IV if urine output drops Minimal chest tube drainage We'll check another ABG tonight

## 2017-03-24 NOTE — Care Management Note (Signed)
Case Management Note Marvetta Gibbons RN, BSN Unit 4E-Case Manager-- [place] coverage [phone]  Patient Details  Name: Sean Riley MRN: [id number] Date of Birth: [birth year]  Subjective/Objective:   Pt admitted with acute type A aortic dissection - to OR for emergent repair now s/p REPAIR OF ACUTE ASCENDING THORACIC AORTIC DISSECTION.  Bentall procedure 8/15                 Action/Plan: PTA pt lived at home with wife-independent,  CM to follow for d/c needs  Expected Discharge Date:                  Expected Discharge Plan:     In-House Referral:     Discharge planning Services  CM Consult  Post Acute Care Choice:    Choice offered to:     DME Arranged:    DME Agency:     HH Arranged:    HH Agency:     Status of Service:  In process, will continue to follow  If discussed at Long Length of Stay Meetings, dates discussed:    Discharge Disposition:   Additional Comments:  [name], RN 03/24/2017, 12:36 PM

## 2017-03-24 NOTE — Progress Notes (Signed)
      [address]       [place] [address]             [phone]      Notified that swan had an RV pressure tracing.  Swan advanced into PA without difficulty.  Revonda Standard Roxan Hockey, MD [facility] [phone]

## 2017-03-24 NOTE — Transfer of Care (Signed)
Immediate Anesthesia Transfer of Care Note  Patient: Sean Riley  Procedure(s) Performed: Procedure(s): REPAIR OF ACUTE ASCENDING THORACIC AORTIC DISSECTION.  Bentall procedure.  Aortic root repleacement with bioprosthetic valve.  Reimplantation of left and right coronary arteries.  Total resection of transverse aortic arch.  Elephant trunk distal anastomosis and debranching of arch vessels. (N/A) TRANSESOPHAGEAL ECHOCARDIOGRAM (TEE) (N/A)  Patient Location: SICU  Anesthesia Type:General  Level of Consciousness: sedated, unresponsive and Patient remains intubated per anesthesia plan  Airway & Oxygen Therapy: Patient remains intubated per anesthesia plan and Patient placed on Ventilator (see vital sign flow sheet for setting)  Post-op Assessment: Report given to RN and Post -op Vital signs reviewed and stable  Post vital signs: Reviewed and stable  Last Vitals:  Vitals:   03/23/17 1700 03/23/17 1720  BP: (!) 143/61 (!) 125/58  Pulse: 77 (!) 53  Resp: 14 13  SpO2: 100% 100%    Last Pain:  Vitals:   03/23/17 1605  PainSc: 8          Complications: No apparent anesthesia complications

## 2017-03-25 ENCOUNTER — Telehealth: Payer: Self-pay | Admitting: Internal Medicine

## 2017-03-25 ENCOUNTER — Inpatient Hospital Stay (HOSPITAL_COMMUNITY): Payer: Medicare Other

## 2017-03-25 LAB — POCT I-STAT 3, ART BLOOD GAS (G3+)
Acid-Base Excess: 2 mmol/L (ref 0.0–2.0)
Acid-Base Excess: 2 mmol/L (ref 0.0–2.0)
Acid-base deficit: 2 mmol/L (ref 0.0–2.0)
Bicarbonate: 21.9 mmol/L (ref 20.0–28.0)
Bicarbonate: 25.9 mmol/L (ref 20.0–28.0)
Bicarbonate: 26.2 mmol/L (ref 20.0–28.0)
O2 SAT: 94 %
O2 SAT: 94 %
O2 Saturation: 94 %
PCO2 ART: 37.3 mmHg (ref 32.0–48.0)
PCO2 ART: 32.5 mmHg (ref 32.0–48.0)
PH ART: 7.423 (ref 7.350–7.450)
PH ART: 7.437 (ref 7.350–7.450)
PH ART: 7.45 (ref 7.350–7.450)
PO2 ART: 68 mmHg — AB (ref 83.0–108.0)
PO2 ART: 69 mmHg — AB (ref 83.0–108.0)
Patient temperature: 37.4
Patient temperature: 37.4
TCO2: 23 mmol/L (ref 0–100)
TCO2: 27 mmol/L (ref 0–100)
TCO2: 27 mmol/L (ref 0–100)
pCO2 arterial: 40.2 mmHg (ref 32.0–48.0)
pO2, Arterial: 71 mmHg — ABNORMAL LOW (ref 83.0–108.0)

## 2017-03-25 LAB — PREPARE FRESH FROZEN PLASMA
UNIT DIVISION: 0
Unit division: 0
Unit division: 0
Unit division: 0

## 2017-03-25 LAB — HEPATIC FUNCTION PANEL
ALT: 64 U/L — ABNORMAL HIGH (ref 17–63)
AST: 183 U/L — ABNORMAL HIGH (ref 15–41)
Albumin: 2.7 g/dL — ABNORMAL LOW (ref 3.5–5.0)
Alkaline Phosphatase: 35 U/L — ABNORMAL LOW (ref 38–126)
Bilirubin, Direct: 0.3 mg/dL (ref 0.1–0.5)
Indirect Bilirubin: 1 mg/dL — ABNORMAL HIGH (ref 0.3–0.9)
Total Bilirubin: 1.3 mg/dL — ABNORMAL HIGH (ref 0.3–1.2)
Total Protein: 4.5 g/dL — ABNORMAL LOW (ref 6.5–8.1)

## 2017-03-25 LAB — BPAM PLATELET PHERESIS
BLOOD PRODUCT EXPIRATION DATE: 201808152359
BLOOD PRODUCT EXPIRATION DATE: 201808152359
ISSUE DATE / TIME: 201808150048
ISSUE DATE / TIME: 201808150048
Unit Type and Rh: 6200
Unit Type and Rh: 7300

## 2017-03-25 LAB — BPAM FFP
BLOOD PRODUCT EXPIRATION DATE: 201808182359
Blood Product Expiration Date: 201808182359
Blood Product Expiration Date: 201808182359
Blood Product Expiration Date: 201808182359
ISSUE DATE / TIME: 201808150050
ISSUE DATE / TIME: 201808150050
ISSUE DATE / TIME: 201808150050
ISSUE DATE / TIME: 201808150050
UNIT TYPE AND RH: 7300
Unit Type and Rh: 1700
Unit Type and Rh: 7300
Unit Type and Rh: 7300

## 2017-03-25 LAB — BPAM CRYOPRECIPITATE
BLOOD PRODUCT EXPIRATION DATE: 201808150851
Blood Product Expiration Date: 201808150851
Blood Product Expiration Date: 201808150851
ISSUE DATE / TIME: 201808150406
ISSUE DATE / TIME: 201808150406
ISSUE DATE / TIME: 201808150406
UNIT TYPE AND RH: 5100
Unit Type and Rh: 5100
Unit Type and Rh: 5100

## 2017-03-25 LAB — PREPARE PLATELET PHERESIS
Unit division: 0
Unit division: 0

## 2017-03-25 LAB — PREPARE CRYOPRECIPITATE
UNIT DIVISION: 0
Unit division: 0
Unit division: 0

## 2017-03-25 LAB — CBC
HCT: 31 % — ABNORMAL LOW (ref 39.0–52.0)
HEMATOCRIT: 33.4 % — AB (ref 39.0–52.0)
Hemoglobin: 11.3 g/dL — ABNORMAL LOW (ref 13.0–17.0)
Hemoglobin: 10.2 g/dL — ABNORMAL LOW (ref 13.0–17.0)
MCH: 29 pg (ref 26.0–34.0)
MCH: 28.6 pg (ref 26.0–34.0)
MCHC: 33.8 g/dL (ref 30.0–36.0)
MCHC: 32.9 g/dL (ref 30.0–36.0)
MCV: 85.9 fL (ref 78.0–100.0)
MCV: 86.8 fL (ref 78.0–100.0)
PLATELETS: 78 10*3/uL — AB (ref 150–400)
PLATELETS: 76 10*3/uL — AB (ref 150–400)
RBC: 3.89 MIL/uL — ABNORMAL LOW (ref 4.22–5.81)
RBC: 3.57 MIL/uL — AB (ref 4.22–5.81)
RDW: 15.3 % (ref 11.5–15.5)
RDW: 15.3 % (ref 11.5–15.5)
WBC: 20 10*3/uL — ABNORMAL HIGH (ref 4.0–10.5)
WBC: 17.3 10*3/uL — ABNORMAL HIGH (ref 4.0–10.5)

## 2017-03-25 LAB — BASIC METABOLIC PANEL
Anion gap: 9 (ref 5–15)
BUN: 15 mg/dL (ref 6–20)
CALCIUM: 7.5 mg/dL — AB (ref 8.9–10.3)
CO2: 25 mmol/L (ref 22–32)
Chloride: 107 mmol/L (ref 101–111)
Creatinine, Ser: 1.32 mg/dL — ABNORMAL HIGH (ref 0.61–1.24)
GFR calc Af Amer: >60 mL/min (ref >60)
GFR, EST NON AFRICAN AMERICAN: 54 mL/min — AB (ref >60)
GLUCOSE: 118 mg/dL — AB (ref 65–99)
Potassium: 3.9 mmol/L (ref 3.5–5.1)
Sodium: 141 mmol/L (ref 135–145)

## 2017-03-25 LAB — GLUCOSE, CAPILLARY
GLUCOSE-CAPILLARY: 115 mg/dL — AB (ref 65–99)
GLUCOSE-CAPILLARY: 154 mg/dL — AB (ref 65–99)
Glucose-Capillary: 101 mg/dL — ABNORMAL HIGH (ref 65–99)
Glucose-Capillary: 105 mg/dL — ABNORMAL HIGH (ref 65–99)
Glucose-Capillary: 108 mg/dL — ABNORMAL HIGH (ref 65–99)
Glucose-Capillary: 115 mg/dL — ABNORMAL HIGH (ref 65–99)
Glucose-Capillary: 157 mg/dL — ABNORMAL HIGH (ref 65–99)

## 2017-03-25 LAB — POCT I-STAT, CHEM 8
BUN: 22 mg/dL — AB (ref 6–20)
CREATININE: 1.5 mg/dL — AB (ref 0.61–1.24)
Calcium, Ion: 1.04 mmol/L — ABNORMAL LOW (ref 1.15–1.40)
Chloride: 101 mmol/L (ref 101–111)
Glucose, Bld: 159 mg/dL — ABNORMAL HIGH (ref 65–99)
HEMATOCRIT: 30 % — AB (ref 39.0–52.0)
HEMOGLOBIN: 10.2 g/dL — AB (ref 13.0–17.0)
POTASSIUM: 3.9 mmol/L (ref 3.5–5.1)
SODIUM: 141 mmol/L (ref 135–145)
TCO2: 29 mmol/L (ref 0–100)

## 2017-03-25 LAB — CREATININE, SERUM
Creatinine, Ser: 1.65 mg/dL — ABNORMAL HIGH (ref 0.61–1.24)
GFR calc Af Amer: 48 mL/min — ABNORMAL LOW (ref >60)
GFR calc non Af Amer: 41 mL/min — ABNORMAL LOW (ref >60)

## 2017-03-25 LAB — MAGNESIUM
Magnesium: 2.3 mg/dL (ref 1.7–2.4)
Magnesium: 2.5 mg/dL — ABNORMAL HIGH (ref 1.7–2.4)

## 2017-03-25 MED ORDER — FLECAINIDE ACETATE 50 MG PO TABS
100.0000 mg | ORAL_TABLET | Freq: Two times a day (BID) | ORAL | Status: DC
  Administered 2017-03-25 – 2017-04-09 (×30): 100 mg via ORAL
  Filled 2017-03-25 (×4): qty 2
  Filled 2017-03-25: qty 1
  Filled 2017-03-25 (×3): qty 2
  Filled 2017-03-25: qty 1
  Filled 2017-03-25 (×2): qty 2
  Filled 2017-03-25 (×2): qty 1
  Filled 2017-03-25: qty 2
  Filled 2017-03-25 (×3): qty 1
  Filled 2017-03-25: qty 2
  Filled 2017-03-25: qty 1
  Filled 2017-03-25 (×2): qty 2
  Filled 2017-03-25 (×4): qty 1
  Filled 2017-03-25: qty 2
  Filled 2017-03-25 (×2): qty 1
  Filled 2017-03-25: qty 2
  Filled 2017-03-25: qty 1

## 2017-03-25 MED ORDER — ORAL CARE MOUTH RINSE
15.0000 mL | Freq: Two times a day (BID) | OROMUCOSAL | Status: DC
  Administered 2017-03-26 – 2017-04-08 (×19): 15 mL via OROMUCOSAL

## 2017-03-25 MED ORDER — IPRATROPIUM-ALBUTEROL 0.5-2.5 (3) MG/3ML IN SOLN
3.0000 mL | Freq: Four times a day (QID) | RESPIRATORY_TRACT | Status: DC
  Administered 2017-03-25 – 2017-04-03 (×35): 3 mL via RESPIRATORY_TRACT
  Filled 2017-03-25 (×35): qty 3

## 2017-03-25 MED ORDER — FUROSEMIDE 10 MG/ML IJ SOLN
40.0000 mg | Freq: Three times a day (TID) | INTRAMUSCULAR | Status: DC
  Administered 2017-03-25 – 2017-03-28 (×9): 40 mg via INTRAVENOUS
  Filled 2017-03-25 (×9): qty 4

## 2017-03-25 MED ORDER — LABETALOL HCL 5 MG/ML IV SOLN
10.0000 mg | INTRAVENOUS | Status: DC | PRN
Start: 2017-03-25 — End: 2017-04-09
  Administered 2017-03-26: 10 mg via INTRAVENOUS
  Filled 2017-03-25: qty 4

## 2017-03-25 MED ORDER — NICOTINE 14 MG/24HR TD PT24
14.0000 mg | MEDICATED_PATCH | Freq: Every day | TRANSDERMAL | Status: DC
  Administered 2017-03-25 – 2017-04-09 (×16): 14 mg via TRANSDERMAL
  Filled 2017-03-25 (×16): qty 1

## 2017-03-25 MED ORDER — FUROSEMIDE 10 MG/ML IJ SOLN
20.0000 mg | Freq: Once | INTRAMUSCULAR | Status: AC
  Administered 2017-03-25: 20 mg via INTRAVENOUS
  Filled 2017-03-25: qty 2

## 2017-03-25 MED ORDER — FUROSEMIDE 10 MG/ML IJ SOLN
20.0000 mg | Freq: Four times a day (QID) | INTRAMUSCULAR | Status: DC
Start: 2017-03-25 — End: 2017-03-25
  Administered 2017-03-25 (×2): 20 mg via INTRAVENOUS
  Filled 2017-03-25 (×2): qty 2

## 2017-03-25 MED FILL — Thrombin For Soln 20000 Unit: CUTANEOUS | Qty: 1 | Status: AC

## 2017-03-25 NOTE — Progress Notes (Signed)
Patient ID: Sean Riley, male   DOB: 1951-01-26, 66 y.o.   MRN: [id number]   SICU Evening Rounds:   Hemodynamically stable   sats 96% on HFNC  Urine output good  CT output low  CBC    Component Value Date/Time   WBC 17.3 (H) 03/25/2017 1615   RBC 3.57 (L) 03/25/2017 1615   HGB 10.2 (L) 03/25/2017 1627   HCT 30.0 (L) 03/25/2017 1627   PLT 76 (L) 03/25/2017 1615   MCV 86.8 03/25/2017 1615   MCH 28.6 03/25/2017 1615   MCHC 32.9 03/25/2017 1615   RDW 15.3 03/25/2017 1615   LYMPHSABS 0.9 03/23/2017 1806   MONOABS 0.8 03/23/2017 1806   EOSABS 0.0 03/23/2017 1806   BASOSABS 0.0 03/23/2017 1806     BMET    Component Value Date/Time   NA 141 03/25/2017 1627   K 3.9 03/25/2017 1627   CL 101 03/25/2017 1627   CO2 25 03/25/2017 0356   GLUCOSE 159 (H) 03/25/2017 1627   BUN 22 (H) 03/25/2017 1627   CREATININE 1.50 (H) 03/25/2017 1627   CALCIUM 7.5 (L) 03/25/2017 0356   GFRNONAA 41 (L) 03/25/2017 1615   GFRAA 48 (L) 03/25/2017 1615     A/P:  Stable postop course. Continue current plans

## 2017-03-25 NOTE — Progress Notes (Addendum)
[address]       [place] [address]             [phone]        CARDIOTHORACIC SURGERY PROGRESS NOTE   R2 Days Post-Op Procedure(s) (LRB): REPAIR OF ACUTE ASCENDING THORACIC AORTIC DISSECTION.  Bentall procedure.  Aortic root repleacement with bioprosthetic valve.  Reimplantation of left and right coronary arteries.  Total resection of transverse aortic arch.  Elephant trunk distal anastomosis and debranching of arch vessels. (N/A) TRANSESOPHAGEAL ECHOCARDIOGRAM (TEE) (N/A)  Subjective: Patient looks remarkably good.  Mild soreness in chest. Hungry.  No nausea or abdominal pain.  Denies SOB.  Placed on BiPAP overnight for marginal oxygenation but breathing very comfortably  Objective: Vital signs: BP Readings from Last 1 Encounters:  03/25/17 131/80   Pulse Readings from Last 1 Encounters:  03/25/17 84   Resp Readings from Last 1 Encounters:  03/25/17 (!) 23   Temp Readings from Last 1 Encounters:  03/25/17 99.3 F (37.4 C)    Hemodynamics: PAP: (19-40)/(1-25) 25/13 CO:  [3.1 L/min-5.6 L/min] 4.1 L/min CI:  [1.5 L/min/m2-2.7 L/min/m2] 2 L/min/m2  Physical Exam:  Rhythm:   sinus  Breath sounds: clear  Heart sounds:  RRR  Incisions:  Dressings dry, intact  Abdomen:  Soft, non-distended, non-tender  Extremities:  Warm, well-perfused, swollen  Chest tubes:  significant volume thin serosanguinous output, no air leak     Intake/Output from previous day: 08/15 0701 - 08/16 0700 In: 3238.2 [I.V.:2488.2; NG/GT:100; IV Piggyback:650] Out: 9485 [Urine:3385; Emesis/NG output:160; Chest Tube:1440] Intake/Output this shift: No intake/output data recorded.  Lab Results:  CBC: Recent Labs  03/24/17 1112 03/24/17 2057 03/25/17 0356  WBC 15.6*  --  20.0*  HGB 11.6* 10.5* 11.3*  HCT 34.7* 31.0* 33.4*  PLT 88*  --  78*    BMET:  Recent Labs  03/23/17 1806  03/24/17 2057 03/25/17 0356  NA 129*  < > 141 141  K 4.3  < > 3.9 3.9  CL  97*  < > 103 107  CO2 23  --   --  25  GLUCOSE 112*  < > 99 118*  BUN 10  < > 14 15  CREATININE 1.25*  < > 1.10 1.32*  CALCIUM 8.2*  --   --  7.5*  < > = values in this interval not displayed.   PT/INR:   Recent Labs  03/24/17 0621  LABPROT 14.2  INR 1.09    CBG (last 3)   Recent Labs  03/24/17 2157 03/25/17 0003 03/25/17 0351  GLUCAP 94 105* 115*    ABG    Component Value Date/Time   PHART 7.450 03/25/2017 0354   PCO2ART 37.3 03/25/2017 0354   PO2ART 68.0 (L) 03/25/2017 0354   HCO3 25.9 03/25/2017 0354   TCO2 27 03/25/2017 0354   ACIDBASEDEF 2.0 03/24/2017 0426   O2SAT 94.0 03/25/2017 0354    CXR: PORTABLE CHEST 1 VIEW  COMPARISON:  03/24/2017 .  FINDINGS: Interim extubation removal of NG tube. Bilateral chest tubes and mediastinal drainage catheter stable position. Swan-Ganz catheter tip noted in the pulmonary outflow tract on today's exam. Prior median sternotomy and cardiac valve replacement. Prior CABG. Stable cardiomegaly. Mild bilateral pulmonary interstitial prominence noted. Mild component CHF cannot be excluded. Mild basilar atelectasis.  IMPRESSION: 1. Interim extubation and removal of NG tube. Bilateral chest tubes and mediastinal drainage catheter in stable position. Swan-Ganz catheter tip noted in the pulmonary outflow track on  today's exam .  2. Prior cardiac valve replacement and CABG. Stable cardiomegaly with mild bilateral interstitial prominence noted. Mild CHF cannot be excluded. Similar findings noted on prior exam.   Electronically Signed   By: [place]   On: 03/25/2017 07:16   Assessment/Plan: S/P Procedure(s) (LRB): REPAIR OF ACUTE ASCENDING THORACIC AORTIC DISSECTION.  Bentall procedure.  Aortic root repleacement with bioprosthetic valve.  Reimplantation of left and right coronary arteries.  Total resection of transverse aortic arch.  Elephant trunk distal anastomosis and debranching of arch vessels.  (N/A) TRANSESOPHAGEAL ECHOCARDIOGRAM (TEE) (N/A)  Doing remarkably well Maintaining NSR w/ stable BP off all drips except low dose dopamine Breathing comfortably w/ O2 sats 96% on BiPAP, CXR looks remarkably clear Longstanding heavy tobacco abuse w/ likely significant COPD Expected post op atelectasis, mild, good inspiratory effort Expected post op acute blood loss anemia, mild, Hgb 11.3 this morning Expected post op volume excess, weight reportedly 23 lbs > preop Slightly elevated serum creatinine, likely due to prerenal azotemia +/- acute kidney injury caused by ATN Post op thrombocytopenia, platelet count 78k this morning Leukocytosis w/out fever, likely reactive Hypertension Remote h/o paroxysmal Afib 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

## 2017-03-25 NOTE — Telephone Encounter (Signed)
Pt's spouse called in to cancel apt, she want to let provider know that pt is in the hospital and to review visit on mychart. Pt is at [facility].

## 2017-03-26 ENCOUNTER — Inpatient Hospital Stay (HOSPITAL_COMMUNITY): Payer: Medicare Other

## 2017-03-26 LAB — BPAM RBC
BLOOD PRODUCT EXPIRATION DATE: 201809012359
BLOOD PRODUCT EXPIRATION DATE: 201809052359
BLOOD PRODUCT EXPIRATION DATE: 201809072359
BLOOD PRODUCT EXPIRATION DATE: 201809072359
BLOOD PRODUCT EXPIRATION DATE: 201809072359
BLOOD PRODUCT EXPIRATION DATE: 201809072359
BLOOD PRODUCT EXPIRATION DATE: 201809072359
BLOOD PRODUCT EXPIRATION DATE: 201809082359
BLOOD PRODUCT EXPIRATION DATE: 201809082359
BLOOD PRODUCT EXPIRATION DATE: 201809082359
Blood Product Expiration Date: 201809072359
Blood Product Expiration Date: 201809082359
Blood Product Expiration Date: 201809082359
Blood Product Expiration Date: 201809082359
ISSUE DATE / TIME: 201808151146
ISSUE DATE / TIME: 201808151228
ISSUE DATE / TIME: 201808160948
ISSUE DATE / TIME: 201808161101
ISSUE DATE / TIME: 201808161639
ISSUE DATE / TIME: 201808161950
ISSUE DATE / TIME: 201808141848
ISSUE DATE / TIME: 201808141848
ISSUE DATE / TIME: 201808150406
ISSUE DATE / TIME: 201808151042
ISSUE DATE / TIME: 201808162118
ISSUE DATE / TIME: 201808170009
UNIT TYPE AND RH: 5100
UNIT TYPE AND RH: 5100
UNIT TYPE AND RH: 5100
UNIT TYPE AND RH: 5100
UNIT TYPE AND RH: 5100
UNIT TYPE AND RH: 5100
Unit Type and Rh: 5100
Unit Type and Rh: 5100
Unit Type and Rh: 5100
Unit Type and Rh: 5100
Unit Type and Rh: 5100
Unit Type and Rh: 5100
Unit Type and Rh: 5100
Unit Type and Rh: 5100

## 2017-03-26 LAB — TYPE AND SCREEN
ABO/RH(D): O NEG
ANTIBODY SCREEN: NEGATIVE
UNIT DIVISION: 0
UNIT DIVISION: 0
UNIT DIVISION: 0
UNIT DIVISION: 0
UNIT DIVISION: 0
UNIT DIVISION: 0
Unit division: 0
Unit division: 0
Unit division: 0
Unit division: 0
Unit division: 0
Unit division: 0
Unit division: 0
Unit division: 0

## 2017-03-26 LAB — GLUCOSE, CAPILLARY
Glucose-Capillary: 120 mg/dL — ABNORMAL HIGH (ref 65–99)
Glucose-Capillary: 145 mg/dL — ABNORMAL HIGH (ref 65–99)
Glucose-Capillary: 139 mg/dL — ABNORMAL HIGH (ref 65–99)

## 2017-03-26 LAB — BASIC METABOLIC PANEL
Anion gap: 9 (ref 5–15)
BUN: 24 mg/dL — AB (ref 6–20)
CALCIUM: 7.8 mg/dL — AB (ref 8.9–10.3)
CO2: 28 mmol/L (ref 22–32)
CREATININE: 1.57 mg/dL — AB (ref 0.61–1.24)
Chloride: 102 mmol/L (ref 101–111)
GFR calc Af Amer: 51 mL/min — ABNORMAL LOW (ref >60)
GFR, EST NON AFRICAN AMERICAN: 44 mL/min — AB (ref >60)
GLUCOSE: 125 mg/dL — AB (ref 65–99)
Potassium: 3.8 mmol/L (ref 3.5–5.1)
Sodium: 139 mmol/L (ref 135–145)

## 2017-03-26 LAB — CBC
HCT: 28.8 % — ABNORMAL LOW (ref 39.0–52.0)
HEMOGLOBIN: 9.5 g/dL — AB (ref 13.0–17.0)
MCH: 28.9 pg (ref 26.0–34.0)
MCHC: 33 g/dL (ref 30.0–36.0)
MCV: 87.5 fL (ref 78.0–100.0)
PLATELETS: 74 10*3/uL — AB (ref 150–400)
RBC: 3.29 MIL/uL — ABNORMAL LOW (ref 4.22–5.81)
RDW: 15.4 % (ref 11.5–15.5)
WBC: 15.9 10*3/uL — ABNORMAL HIGH (ref 4.0–10.5)

## 2017-03-26 MED ORDER — SODIUM CHLORIDE 0.9% FLUSH
3.0000 mL | Freq: Two times a day (BID) | INTRAVENOUS | Status: DC
  Administered 2017-03-26 – 2017-03-28 (×2): 3 mL via INTRAVENOUS

## 2017-03-26 MED ORDER — MOVING RIGHT ALONG BOOK
Freq: Once | Status: AC
  Administered 2017-03-26: 1
  Filled 2017-03-26: qty 1

## 2017-03-26 MED ORDER — LABETALOL HCL 200 MG PO TABS
200.0000 mg | ORAL_TABLET | Freq: Two times a day (BID) | ORAL | Status: DC
  Administered 2017-03-26 – 2017-03-28 (×5): 200 mg via ORAL
  Filled 2017-03-26 (×5): qty 1

## 2017-03-26 MED ORDER — SODIUM CHLORIDE 0.9% FLUSH: 3.0000 mL | INTRAVENOUS | Status: DC | PRN

## 2017-03-26 MED ORDER — SODIUM CHLORIDE 0.9 % IV SOLN: 250.0000 mL | INTRAVENOUS | Status: DC | PRN

## 2017-03-26 NOTE — Telephone Encounter (Signed)
Thank you, I have been reading his chart.

## 2017-03-26 NOTE — Telephone Encounter (Signed)
Pt admitted to [facility] since 03/23/2017 in cardio ICU d/t dissection of thoracic aorta.

## 2017-03-26 NOTE — Progress Notes (Signed)
[address]       [place] [address]             [phone]        CARDIOTHORACIC SURGERY PROGRESS NOTE   R3 Days Post-Op Procedure(s) (LRB): REPAIR OF ACUTE ASCENDING THORACIC AORTIC DISSECTION.  Bentall procedure.  Aortic root repleacement with bioprosthetic valve.  Reimplantation of left and right coronary arteries.  Total resection of transverse aortic arch.  Elephant trunk distal anastomosis and debranching of arch vessels. (N/A) TRANSESOPHAGEAL ECHOCARDIOGRAM (TEE) (N/A)  Subjective: Looks great.  Ambulated nearly 200 feet in ICU this morning.  Minimal pain.  Denies SOB.  Feels swollen.  Asking for breakfast  Objective: Vital signs: BP Readings from Last 1 Encounters:  03/26/17 132/70   Pulse Readings from Last 1 Encounters:  03/26/17 74   Resp Readings from Last 1 Encounters:  03/26/17 20   Temp Readings from Last 1 Encounters:  03/26/17 97.7 F (36.5 C) (Oral)    Hemodynamics:    Physical Exam:  Rhythm:   sinus  Breath sounds: clear  Heart sounds:  RRR  Incisions:  Dressing dry, intact  Abdomen:  Soft, non-distended, non-tender  Extremities:  Warm, well-perfused  Chest tubes:  decreasing volume thin serosanguinous output, no air leak    Intake/Output from previous day: 08/16 0701 - 08/17 0700 In: 1438.8 [P.O.:920; I.V.:518.8] Out: 1900 [Urine:990; Chest Tube:910] Intake/Output this shift: No intake/output data recorded.  Lab Results:  CBC: Recent Labs  03/25/17 1615 03/25/17 1627 03/26/17 0425  WBC 17.3*  --  15.9*  HGB 10.2* 10.2* 9.5*  HCT 31.0* 30.0* 28.8*  PLT 76*  --  74*    BMET:  Recent Labs  03/25/17 0356  03/25/17 1627 03/26/17 0425  NA 141  --  141 139  K 3.9  --  3.9 3.8  CL 107  --  101 102  CO2 25  --   --  28  GLUCOSE 118*  --  159* 125*  BUN 15  --  22* 24*  CREATININE 1.32*  < > 1.50* 1.57*  CALCIUM 7.5*  --   --  7.8*  < > = values in this interval not displayed.   PT/INR:   Recent  Labs  03/24/17 0621  LABPROT 14.2  INR 1.09    CBG (last 3)   Recent Labs  03/25/17 1942 03/25/17 2331 03/26/17 0356  GLUCAP 115* 108* 120*    ABG    Component Value Date/Time   PHART 7.437 03/25/2017 0742   PCO2ART 32.5 03/25/2017 0742   PO2ART 69.0 (L) 03/25/2017 0742   HCO3 21.9 03/25/2017 0742   TCO2 29 03/25/2017 1627   ACIDBASEDEF 2.0 03/25/2017 0742   O2SAT 94.0 03/25/2017 0742    CXR: PORTABLE CHEST 1 VIEW  COMPARISON:  03/25/2017 .  FINDINGS: Right IJ sheath, bilateral chest tubes, mediastinal drainage catheter in stable position. Prior CABG. Cardiomegaly with diffuse bilateral pulmonary interstitial prominence, slightly increased from prior exam. Findings consistent with CHF. Tiny bilateral pleural effusions cannot be excluded. No pneumothorax .  IMPRESSION: Right IJ sheath, bilateral chest tubes, mediastinal drainage catheter stable position. Prior CABG. Cardiomegaly with diffuse slight increase in pulmonary interstitial prominence consistent with slight worsening of CHF.   Electronically Signed   By: Marcello Moores  Register   On: 03/26/2017 07:13  Assessment/Plan: S/P Procedure(s) (LRB): REPAIR OF ACUTE ASCENDING THORACIC AORTIC DISSECTION.  Bentall procedure.  Aortic root repleacement with bioprosthetic valve.  Reimplantation of left  and right coronary arteries.  Total resection of transverse aortic arch.  Elephant trunk distal anastomosis and debranching of arch vessels. (N/A) TRANSESOPHAGEAL ECHOCARDIOGRAM (TEE) (N/A)  Doing remarkably well POD2 Maintaining NSR w/ stable BP off all drips except low dose dopamine Breathing comfortably w/ O2 sats 96% on HFNC, CXR looks remarkably clear Longstanding heavy tobacco abuse w/ likely significant COPD Expected post op atelectasis, mild, good inspiratory effort Expected post op acute blood loss anemia, Hgb down slightly 9.5 this morning Expected post op volume excess, weight reportedly still 23 lbs >  preop Slightly elevated serum creatinine, likely due to prerenal azotemia +/- acute kidney injury caused by ATN - stable overnight Post op thrombocytopenia, platelet count essentially stable 74k this morning Leukocytosis w/out fever, likely reactive, trending down Hypertension Remote h/o paroxysmal Afib 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

## 2017-03-26 NOTE — Progress Notes (Signed)
Patient ID: BEXTON HAAK, male   DOB: [birth year], 66 y.o.   MRN: [id number] EVENING ROUNDS NOTE :     [address]       [place] [address]             [phone]                 3 Days Post-Op Procedure(s) (LRB): REPAIR OF ACUTE ASCENDING THORACIC AORTIC DISSECTION.  Bentall procedure.  Aortic root repleacement with bioprosthetic valve.  Reimplantation of left and right coronary arteries.  Total resection of transverse aortic arch.  Elephant trunk distal anastomosis and debranching of arch vessels. (N/A) TRANSESOPHAGEAL ECHOCARDIOGRAM (TEE) (N/A)  Total Length of Stay:  LOS: 2 days  BP 124/65   Pulse 71   Temp 97.9 F (36.6 C) (Oral)   Resp (!) 22   Ht 6\' 1"  (1.854 m)   Wt 216 lb 14.9 oz (98.4 kg)   SpO2 96%   BMI 28.62 kg/m   .Intake/Output      08/17 0701 - 08/18 0700   I.V. (mL/kg) 210 (2.1)   Total Intake(mL/kg) 210 (2.1)   Urine (mL/kg/hr) 905 (0.7)   Chest Tube 360   Total Output 1265   Net -1055         . sodium chloride 250 mL (03/25/17 0600)  . sodium chloride    . lactated ringers 20 mL/hr at 03/24/17 2300  . lactated ringers       Lab Results  Component Value Date   WBC 15.9 (H) 03/26/2017   HGB 9.5 (L) 03/26/2017   HCT 28.8 (L) 03/26/2017   PLT 74 (L) 03/26/2017   GLUCOSE 125 (H) 03/26/2017   CHOL 162 10/05/2016   TRIG 105.0 10/05/2016   HDL 43.80 10/05/2016   LDLDIRECT 137.9 04/24/2013   LDLCALC 97 10/05/2016   ALT 64 (H) 03/25/2017   AST 183 (H) 03/25/2017   NA 139 03/26/2017   K 3.8 03/26/2017   CL 102 03/26/2017   CREATININE 1.57 (H) 03/26/2017   BUN 24 (H) 03/26/2017   CO2 28 03/26/2017   TSH 1.00 03/12/2016   PSA 7.18 (H) 01/10/2016   INR 1.09 03/24/2017   HGBA1C 5.5 10/28/2011   Neuro intact Walked 200 feet  Got lasix 40 mg at 1 pm today   Grace Isaac MD  Beeper [phone] Office [phone] 03/26/2017 7:26 PM

## 2017-03-27 ENCOUNTER — Inpatient Hospital Stay (HOSPITAL_COMMUNITY): Payer: Medicare Other

## 2017-03-27 LAB — CBC
HCT: 30.4 % — ABNORMAL LOW (ref 39.0–52.0)
HEMOGLOBIN: 9.9 g/dL — AB (ref 13.0–17.0)
MCH: 28.7 pg (ref 26.0–34.0)
MCHC: 32.6 g/dL (ref 30.0–36.0)
MCV: 88.1 fL (ref 78.0–100.0)
Platelets: 101 10*3/uL — ABNORMAL LOW (ref 150–400)
RBC: 3.45 MIL/uL — AB (ref 4.22–5.81)
RDW: 15.4 % (ref 11.5–15.5)
WBC: 15.8 10*3/uL — AB (ref 4.0–10.5)

## 2017-03-27 LAB — BASIC METABOLIC PANEL
ANION GAP: 10 (ref 5–15)
BUN: 22 mg/dL — ABNORMAL HIGH (ref 6–20)
CALCIUM: 7.9 mg/dL — AB (ref 8.9–10.3)
CO2: 31 mmol/L (ref 22–32)
Chloride: 98 mmol/L — ABNORMAL LOW (ref 101–111)
Creatinine, Ser: 1.26 mg/dL — ABNORMAL HIGH (ref 0.61–1.24)
GFR, EST NON AFRICAN AMERICAN: 57 mL/min — AB (ref >60)
Glucose, Bld: 121 mg/dL — ABNORMAL HIGH (ref 65–99)
Potassium: 3.6 mmol/L (ref 3.5–5.1)
Sodium: 139 mmol/L (ref 135–145)

## 2017-03-27 MED ORDER — METOCLOPRAMIDE HCL 5 MG/ML IJ SOLN
10.0000 mg | Freq: Three times a day (TID) | INTRAMUSCULAR | Status: AC
Start: 2017-03-27 — End: 2017-03-28
  Administered 2017-03-27 – 2017-03-28 (×3): 10 mg via INTRAVENOUS
  Filled 2017-03-27 (×3): qty 2

## 2017-03-27 MED ORDER — POTASSIUM CHLORIDE 10 MEQ/50ML IV SOLN
10.0000 meq | INTRAVENOUS | Status: AC
Start: 2017-03-27 — End: 2017-03-27
  Administered 2017-03-27 (×3): 10 meq via INTRAVENOUS
  Filled 2017-03-27: qty 50

## 2017-03-27 MED ORDER — POTASSIUM CHLORIDE 10 MEQ/50ML IV SOLN
INTRAVENOUS | Status: AC
  Filled 2017-03-27: qty 50

## 2017-03-27 NOTE — Progress Notes (Signed)
Patient ID: Sean Riley, male   DOB: 27-Mar-1951, 66 y.o.   MRN: [id number] EVENING ROUNDS NOTE :     [address]       [place] [address]             [phone]                 4 Days Post-Op Procedure(s) (LRB): REPAIR OF ACUTE ASCENDING THORACIC AORTIC DISSECTION.  Bentall procedure.  Aortic root repleacement with bioprosthetic valve.  Reimplantation of left and right coronary arteries.  Total resection of transverse aortic arch.  Elephant trunk distal anastomosis and debranching of arch vessels. (N/A) TRANSESOPHAGEAL ECHOCARDIOGRAM (TEE) (N/A)  Total Length of Stay:  LOS: 3 days  BP (!) 112/51   Pulse 69   Temp 98.7 F (37.1 C) (Oral)   Resp 18   Ht 6\' 1"  (1.854 m)   Wt 218 lb 7.6 oz (99.1 kg)   SpO2 96%   BMI 28.82 kg/m   .Intake/Output      08/18 0701 - 08/19 0700   I.V. (mL/kg) 140 (1.4)   IV Piggyback 100   Total Intake(mL/kg) 240 (2.4)   Urine (mL/kg/hr) 290 (0.2)   Chest Tube 250   Total Output 540   Net -300         . sodium chloride 250 mL (03/25/17 0600)  . sodium chloride    . lactated ringers Stopped (03/27/17 1400)  . lactated ringers       Lab Results  Component Value Date   WBC 15.8 (H) 03/27/2017   HGB 9.9 (L) 03/27/2017   HCT 30.4 (L) 03/27/2017   PLT 101 (L) 03/27/2017   GLUCOSE 121 (H) 03/27/2017   CHOL 162 10/05/2016   TRIG 105.0 10/05/2016   HDL 43.80 10/05/2016   LDLDIRECT 137.9 04/24/2013   LDLCALC 97 10/05/2016   ALT 64 (H) 03/25/2017   AST 183 (H) 03/25/2017   NA 139 03/27/2017   K 3.6 03/27/2017   CL 98 (L) 03/27/2017   CREATININE 1.26 (H) 03/27/2017   BUN 22 (H) 03/27/2017   CO2 31 03/27/2017   TSH 1.00 03/12/2016   PSA 7.18 (H) 01/10/2016   INR 1.09 03/24/2017   HGBA1C 5.5 10/28/2011   Stable day   Grace Isaac MD  Beeper [phone] Office [phone] 03/27/2017 7:22 PM

## 2017-03-27 NOTE — Progress Notes (Signed)
Patient ambulated in hallway 295 ft with ava assist., patient tolerated walk well, patient back to bed, call bell in reach, will continue to monitor.  Rowe Pavy, RN

## 2017-03-27 NOTE — Progress Notes (Signed)
Patient back to bed, DC'd MTs per MD order and DC'd foley per order, patient tolerated well, call bell in reach, will continue to monitor.  Rowe Pavy, RN

## 2017-03-27 NOTE — Progress Notes (Signed)
Patient ID: Sean Riley, male   DOB: 05-19-1951, 67 y.o.   MRN: [id number] TCTS DAILY ICU PROGRESS NOTE                   [address]            [place] [address]          [phone]   4 Days Post-Op Procedure(s) (LRB): REPAIR OF ACUTE ASCENDING THORACIC AORTIC DISSECTION.  Bentall procedure.  Aortic root repleacement with bioprosthetic valve.  Reimplantation of left and right coronary arteries.  Total resection of transverse aortic arch.  Elephant trunk distal anastomosis and debranching of arch vessels. (N/A) TRANSESOPHAGEAL ECHOCARDIOGRAM (TEE) (N/A)  Total Length of Stay:  LOS: 3 days   Subjective: Mild abdominal distention , complains of "gas", awake and alert   Objective: Vital signs in last 24 hours: Temp:  [97.8 F (36.6 C)-98.6 F (37 C)] 97.8 F (36.6 C) (08/18 0400) Pulse Rate:  [68-80] 74 (08/18 0700) Cardiac Rhythm: Normal sinus rhythm (08/18 0753) Resp:  [17-25] 22 (08/18 0700) BP: (97-141)/(57-81) 104/57 (08/18 0700) SpO2:  [89 %-100 %] 95 % (08/18 0700) Weight:  [218 lb 7.6 oz (99.1 kg)] 218 lb 7.6 oz (99.1 kg) (08/18 0500)  Filed Weights   03/25/17 0445 03/26/17 0500 03/27/17 0500  Weight: 216 lb 7.9 oz (98.2 kg) 216 lb 14.9 oz (98.4 kg) 218 lb 7.6 oz (99.1 kg)    Weight change: 1 lb 8.7 oz (0.7 kg)   Hemodynamic parameters for last 24 hours:    Intake/Output from previous day: 08/17 0701 - 08/18 0700 In: 520 [I.V.:470; IV Piggyback:50] Out: 2200 [Urine:1630; Chest Tube:570]  Intake/Output this shift: No intake/output data recorded.  Current Meds: Scheduled Meds: . acetaminophen  1,000 mg Oral Q6H  . aspirin EC  325 mg Oral Daily  . bisacodyl  10 mg Oral Daily   Or  . bisacodyl  10 mg Rectal Daily  . Chlorhexidine Gluconate Cloth  6 each Topical Daily  . docusate sodium  200 mg Oral Daily  . flecainide  100 mg Oral BID  . furosemide  40 mg Intravenous Q8H  . ipratropium-albuterol  3 mL Nebulization Q6H  . labetalol   200 mg Oral BID  . mouth rinse  15 mL Mouth Rinse BID  . nicotine  14 mg Transdermal Daily  . pantoprazole  40 mg Oral Daily  . sodium chloride flush  10-40 mL Intracatheter Q12H  . sodium chloride flush  3 mL Intravenous Q12H  . sodium chloride flush  3 mL Intravenous Q12H   Continuous Infusions: . sodium chloride 250 mL (03/25/17 0600)  . sodium chloride    . lactated ringers 20 mL/hr at 03/24/17 2300  . lactated ringers    . potassium chloride Stopped (03/27/17 0714)   PRN Meds:.sodium chloride, labetalol, morphine injection, ondansetron (ZOFRAN) IV, oxyCODONE, sodium chloride flush, sodium chloride flush, sodium chloride flush, traMADol  General appearance: alert and cooperative Neurologic: intact Heart: regular rate and rhythm, S1, S2 normal, no murmur, click, rub or gallop Lungs: diminished breath sounds bibasilar Abdomen: hypoactive bowel sounds , mild distention  Extremities: extremities normal, atraumatic, no cyanosis or edema and Homans sign is negative, no sign of DVT Wound: sternum stable   Lab Results: CBC: Recent Labs  03/26/17 0425 03/27/17 0444  WBC 15.9* 15.8*  HGB 9.5* 9.9*  HCT 28.8* 30.4*  PLT 74* 101*   BMET:  Recent Labs  03/26/17 0425 03/27/17 0444  NA 139 139  K 3.8 3.6  CL 102 98*  CO2 28 31  GLUCOSE 125* 121*  BUN 24* 22*  CREATININE 1.57* 1.26*  CALCIUM 7.8* 7.9*    CMET: Lab Results  Component Value Date   WBC 15.8 (H) 03/27/2017   HGB 9.9 (L) 03/27/2017   HCT 30.4 (L) 03/27/2017   PLT 101 (L) 03/27/2017   GLUCOSE 121 (H) 03/27/2017   CHOL 162 10/05/2016   TRIG 105.0 10/05/2016   HDL 43.80 10/05/2016   LDLDIRECT 137.9 04/24/2013   LDLCALC 97 10/05/2016   ALT 64 (H) 03/25/2017   AST 183 (H) 03/25/2017   NA 139 03/27/2017   K 3.6 03/27/2017   CL 98 (L) 03/27/2017   CREATININE 1.26 (H) 03/27/2017   BUN 22 (H) 03/27/2017   CO2 31 03/27/2017   TSH 1.00 03/12/2016   PSA 7.18 (H) 01/10/2016   INR 1.09 03/24/2017   HGBA1C  5.5 10/28/2011      PT/INR: No results for input(s): LABPROT, INR in the last 72 hours. Radiology: No results found.   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  Bentall procedure.  Aortic root repleacement with bioprosthetic valve.  Reimplantation of left and right coronary arteries.  Total resection of transverse aortic arch.  Elephant trunk distal anastomosis and debranching of arch vessels. (N/A) TRANSESOPHAGEAL ECHOCARDIOGRAM (TEE) (N/A) Mobilize Diuresis-  Renal function stable , cr improved slightly  D/c foley and MT today  Reglan  for nausea, decrease diet to clears only      Grace Isaac 03/27/2017 7:57 AM

## 2017-03-27 NOTE — Progress Notes (Signed)
Patient ambulated in hallway 190 ft 2 assist with ava/eva walker, patient tolerated walk well, patient back to bed, call bell in reach, will continue to monitor.  Rowe Pavy, RN

## 2017-03-28 ENCOUNTER — Inpatient Hospital Stay (HOSPITAL_COMMUNITY): Payer: Medicare Other

## 2017-03-28 LAB — CBC
HCT: 28 % — ABNORMAL LOW (ref 39.0–52.0)
Hemoglobin: 9 g/dL — ABNORMAL LOW (ref 13.0–17.0)
MCH: 28.8 pg (ref 26.0–34.0)
MCHC: 32.1 g/dL (ref 30.0–36.0)
MCV: 89.7 fL (ref 78.0–100.0)
Platelets: 107 10*3/uL — ABNORMAL LOW (ref 150–400)
RBC: 3.12 MIL/uL — ABNORMAL LOW (ref 4.22–5.81)
RDW: 15.5 % (ref 11.5–15.5)
WBC: 10.3 10*3/uL (ref 4.0–10.5)

## 2017-03-28 LAB — BASIC METABOLIC PANEL
Anion gap: 6 (ref 5–15)
BUN: 26 mg/dL — ABNORMAL HIGH (ref 6–20)
CALCIUM: 7.5 mg/dL — AB (ref 8.9–10.3)
CO2: 31 mmol/L (ref 22–32)
CREATININE: 1.21 mg/dL (ref 0.61–1.24)
Chloride: 100 mmol/L — ABNORMAL LOW (ref 101–111)
GLUCOSE: 110 mg/dL — AB (ref 65–99)
Potassium: 3.8 mmol/L (ref 3.5–5.1)
SODIUM: 137 mmol/L (ref 135–145)

## 2017-03-28 LAB — EXPECTORATED SPUTUM ASSESSMENT W GRAM STAIN, RFLX TO RESP C

## 2017-03-28 MED ORDER — SODIUM CHLORIDE 0.9 % IV SOLN
1.0000 g | Freq: Three times a day (TID) | INTRAVENOUS | Status: DC
  Administered 2017-03-28 – 2017-04-04 (×21): 1 g via INTRAVENOUS
  Filled 2017-03-28 (×22): qty 1

## 2017-03-28 MED ORDER — FUROSEMIDE 10 MG/ML IJ SOLN
40.0000 mg | Freq: Once | INTRAMUSCULAR | Status: AC
  Administered 2017-03-28: 40 mg via INTRAVENOUS
  Filled 2017-03-28: qty 4

## 2017-03-28 MED ORDER — FUROSEMIDE 10 MG/ML IJ SOLN
40.0000 mg | Freq: Three times a day (TID) | INTRAMUSCULAR | Status: DC
  Administered 2017-03-28 – 2017-03-29 (×2): 40 mg via INTRAVENOUS
  Filled 2017-03-28 (×2): qty 4

## 2017-03-28 MED ORDER — SODIUM CHLORIDE 0.9 % IV SOLN
1.0000 g | Freq: Three times a day (TID) | INTRAVENOUS | Status: DC
  Filled 2017-03-28: qty 1

## 2017-03-28 NOTE — Progress Notes (Signed)
Pharmacy Antibiotic Note  Sean Riley is a 66 y.o. male admitted on 03/23/2017 with pneumonia.  Pharmacy has been consulted for meropenem dosing.  Plan: 1. Meropenem 1g IV q 8 qhs. 2. F/u cultures, renal function and clinical course.  Height: 6\' 1"  (185.4 cm) Weight: 212 lb 9.6 oz (96.4 kg) IBW/kg (Calculated) : 79.9  Temp (24hrs), Avg:98.4 F (36.9 C), Min:97.8 F (36.6 C), Max:99.2 F (37.3 C)   Recent Labs Lab 03/25/17 0356 03/25/17 1615 03/25/17 1627 03/26/17 0425 03/27/17 0444 03/28/17 0433  WBC 20.0* 17.3*  --  15.9* 15.8* 10.3  CREATININE 1.32* 1.65* 1.50* 1.57* 1.26* 1.21    Estimated Creatinine Clearance: 72.5 mL/min (by C-G formula based on SCr of 1.21 mg/dL).    Allergies  Allergen Reactions  . Simvastatin Other (See Comments)    Mental status changes  . Celecoxib Other (See Comments)    GI UPSET AND INFLAMMATION  . Codeine Other (See Comments)    GI UPSET AND INFLAMMATION  . Nsaids Other (See Comments)    GI UPSET AND INFLAMMATION (can tolerate via IV)  . Tape Other (See Comments)    Medical tape and Band-Aids PULL OFF THE SKIN; please use Coban wrap  . Cephalexin Itching and Rash  . Latex Rash    Antimicrobials this admission:  Vanc 8/15 > 8/16 post op Levaquin 8/15 > 8/16 post op Meropenem 8/19 >   Dose adjustments this admission:   Microbiology results:  8/19 Sputum:   8/15 MRSA PCR: neg  Thank you for allowing pharmacy to be a part of this patient's care.  Uvaldo Rising, BCPS  Clinical Pharmacist Pager [phone]  03/28/2017 10:55 AM

## 2017-03-28 NOTE — Progress Notes (Signed)
Patient ID: Sean Riley, male   DOB: 1950-08-24, 66 y.o.   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  Bentall procedure.  Aortic root repleacement with bioprosthetic valve.  Reimplantation of left and right coronary arteries.  Total resection of transverse aortic arch.  Elephant trunk distal anastomosis and debranching of arch vessels. (N/A) TRANSESOPHAGEAL ECHOCARDIOGRAM (TEE) (N/A)  Total Length of Stay:  LOS: 4 days  BP (!) 101/51   Pulse 71   Temp 98.8 F (37.1 C) (Oral)   Resp 20   Ht 6\' 1"  (1.854 m)   Wt 212 lb 9.6 oz (96.4 kg)   SpO2 98%   BMI 28.05 kg/m   .Intake/Output      08/18 0701 - 08/19 0700 08/19 0701 - 08/20 0700   I.V. (mL/kg) 140 (1.5)    IV Piggyback 100    Total Intake(mL/kg) 240 (2.5)    Urine (mL/kg/hr) 490 (0.2) 150 (0.1)   Chest Tube 500    Total Output 990 150   Net -750 -150        Urine Occurrence 2 x      . sodium chloride 250 mL (03/25/17 0600)  . sodium chloride    . lactated ringers Stopped (03/27/17 1400)  . lactated ringers    . meropenem (MERREM) IV Stopped (03/28/17 1235)     Lab Results  Component Value Date   WBC 10.3 03/28/2017   HGB 9.0 (L) 03/28/2017   HCT 28.0 (L) 03/28/2017   PLT 107 (L) 03/28/2017   GLUCOSE 110 (H) 03/28/2017   CHOL 162 10/05/2016   TRIG 105.0 10/05/2016   HDL 43.80 10/05/2016   LDLDIRECT 137.9 04/24/2013   LDLCALC 97 10/05/2016   ALT 64 (H) 03/25/2017   AST 183 (H) 03/25/2017   NA 137 03/28/2017   K 3.8 03/28/2017   CL 100 (L) 03/28/2017   CREATININE 1.21 03/28/2017   BUN 26 (H) 03/28/2017   CO2 31 03/28/2017   TSH 1.00 03/12/2016   PSA 7.18 (H) 01/10/2016   INR 1.09 03/24/2017   HGBA1C 5.5 10/28/2011   Sputum culture done :results pending  Gram satin :MODERATE WBC PRESENT, PREDOMINANTLY PMN  FEW  SQUAMOUS EPITHELIAL CELLS PRESENT  FEW GRAM POSITIVE RODS  RARE YEAST WITH PSEUDOHYPHAE  RARE GRAM POSITIVE COCCI IN CHAINS   Confused at time today  bp low after Normodyne this am, currently stopped   Grace Isaac MD  Beeper [phone] Office [phone] 03/28/2017 6:03 PM

## 2017-03-28 NOTE — Progress Notes (Signed)
Patient ID: Sean Riley, male   DOB: September 26, 1950, 66 y.o.   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  Bentall procedure.  Aortic root repleacement with bioprosthetic valve.  Reimplantation of left and right coronary arteries.  Total resection of transverse aortic arch.  Elephant trunk distal anastomosis and debranching of arch vessels. (N/A) TRANSESOPHAGEAL ECHOCARDIOGRAM (TEE) (N/A)  Total Length of Stay:  LOS: 4 days   Subjective: Up to chair , alert , coughing up brown sputum   Objective: Vital signs in last 24 hours: Temp:  [97.8 F (36.6 C)-99.2 F (37.3 C)] 99.2 F (37.3 C) (08/19 0808) Pulse Rate:  [66-71] 70 (08/19 0700) Cardiac Rhythm: Normal sinus rhythm (08/19 0700) Resp:  [17-25] 20 (08/19 0700) BP: (93-120)/(50-72) 100/57 (08/19 0700) SpO2:  [83 %-99 %] 96 % (08/19 0700) Weight:  [212 lb 9.6 oz (96.4 kg)] 212 lb 9.6 oz (96.4 kg) (08/19 0500)  Filed Weights   03/26/17 0500 03/27/17 0500 03/28/17 0500  Weight: 216 lb 14.9 oz (98.4 kg) 218 lb 7.6 oz (99.1 kg) 212 lb 9.6 oz (96.4 kg)    Weight change: -5 lb 14 oz (-2.665 kg)   Hemodynamic parameters for last 24 hours:    Intake/Output from previous day: 08/18 0701 - 08/19 0700 In: 240 [I.V.:140; IV Piggyback:100] Out: 990 [Urine:490; Chest Tube:500]  Intake/Output this shift: No intake/output data recorded.  Current Meds: Scheduled Meds: . acetaminophen  1,000 mg Oral Q6H  . aspirin EC  325 mg Oral Daily  . bisacodyl  10 mg Oral Daily   Or  . bisacodyl  10 mg Rectal Daily  . Chlorhexidine Gluconate Cloth  6 each Topical Daily  . docusate sodium  200 mg Oral Daily  . flecainide  100 mg Oral BID  . furosemide  40 mg Intravenous Q8H  . ipratropium-albuterol  3 mL Nebulization Q6H  . labetalol  200 mg Oral  BID  . mouth rinse  15 mL Mouth Rinse BID  . nicotine  14 mg Transdermal Daily  . pantoprazole  40 mg Oral Daily  . sodium chloride flush  10-40 mL Intracatheter Q12H  . sodium chloride flush  3 mL Intravenous Q12H  . sodium chloride flush  3 mL Intravenous Q12H   Continuous Infusions: . sodium chloride 250 mL (03/25/17 0600)  . sodium chloride    . lactated ringers Stopped (03/27/17 1400)  . lactated ringers     PRN Meds:.sodium chloride, labetalol, morphine injection, ondansetron (ZOFRAN) IV, oxyCODONE, sodium chloride flush, sodium chloride flush, sodium chloride flush, traMADol  General appearance: alert and cooperative Neurologic: intact Heart: regular rate and rhythm, S1, S2 normal, no murmur, click, rub or gallop Lungs: diminished breath sounds bibasilar Abdomen: soft, non-tender; bowel sounds normal; no masses,  no organomegaly Extremities: extremities normal, atraumatic, no cyanosis or edema and Homans sign is negative, no sign of DVT Wound: sternum intact  Lab Results: CBC: Recent Labs  03/27/17 0444 03/28/17 0433  WBC 15.8* 10.3  HGB 9.9* 9.0*  HCT 30.4* 28.0*  PLT 101* 107*   BMET:  Recent Labs  03/27/17 0444 03/28/17 0433  NA 139 137  K 3.6  3.8  CL 98* 100*  CO2 31 31  GLUCOSE 121* 110*  BUN 22* 26*  CREATININE 1.26* 1.21  CALCIUM 7.9* 7.5*    CMET: Lab Results  Component Value Date   WBC 10.3 03/28/2017   HGB 9.0 (L) 03/28/2017   HCT 28.0 (L) 03/28/2017   PLT 107 (L) 03/28/2017   GLUCOSE 110 (H) 03/28/2017   CHOL 162 10/05/2016   TRIG 105.0 10/05/2016   HDL 43.80 10/05/2016   LDLDIRECT 137.9 04/24/2013   LDLCALC 97 10/05/2016   ALT 64 (H) 03/25/2017   AST 183 (H) 03/25/2017   NA 137 03/28/2017   K 3.8 03/28/2017   CL 100 (L) 03/28/2017   CREATININE 1.21 03/28/2017   BUN 26 (H) 03/28/2017   CO2 31 03/28/2017   TSH 1.00 03/12/2016   PSA 7.18 (H) 01/10/2016   INR 1.09 03/24/2017   HGBA1C 5.5 10/28/2011      PT/INR: No results  for input(s): LABPROT, INR in the last 72 hours. Radiology: Dg Chest Port 1 View  Result Date: 03/28/2017 CLINICAL DATA:  Chest tube EXAM: PORTABLE CHEST 1 VIEW COMPARISON:  Yesterday FINDINGS: Two thoracic drains remain. Trace left apical pneumothorax, unchanged. Unchanged interstitial opacities and low lung volumes. Stable cardiomegaly and aortic tortuosity. Patient has a known aortic dissection. Status post AV replacement. IMPRESSION: 1. Trace left apical pneumothorax. Remaining thoracic drains in stable position. 2. Unchanged interstitial opacities and low lung volumes. Electronically Signed   By: Monte Fantasia M.D.   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  Bentall procedure.  Aortic root repleacement with bioprosthetic valve.  Reimplantation of left and right coronary arteries.  Total resection of transverse aortic arch.  Elephant trunk distal anastomosis and debranching of arch vessels. (N/A) TRANSESOPHAGEAL ECHOCARDIOGRAM (TEE) (N/A) Will culture sputum, cover with fortaz for possible left upper lobe pneumonia pending cultures, wbc not elevated  D/c chest tubes Diuresis     Grace Isaac 03/28/2017 9:28 AM

## 2017-03-28 NOTE — Progress Notes (Addendum)
Dr. Servando Snare notified of soft BP's with current BP-83/54 (asymptomatic). MD inquired what BP medications administered this AM. Informed MD meds taken this am. MD to d/c Labetalol. Will continue to monitor BP closely.   Also, inquired if MD aware of significant swelling to LUE/left hand & dark purple bruising to left wrist. MD to bedside. MD assessed LUE & stated bruising s/p A-line. Also assessed degree of swelling to entire left upper extremity. MD aware and requested RN continue to monitor/elevate. No new orders received at this time. LUE elevated on pillow. Will continue to monitor.

## 2017-03-28 NOTE — Progress Notes (Signed)
Right & left pleural chest tubes removed by Levada Dy, charge RN. Dry gauze placed to sites. Temp pacer wires remain in place/secured to abdomen via tape. Pt tolerated well. 100cc chest tube output since 7am. VSS. Pt denies any pain or discomfort. Family present at bedside. Will continue to monitor.

## 2017-03-29 ENCOUNTER — Ambulatory Visit: Payer: Medicare Other | Admitting: Internal Medicine

## 2017-03-29 ENCOUNTER — Inpatient Hospital Stay (HOSPITAL_COMMUNITY): Payer: Medicare Other

## 2017-03-29 LAB — BASIC METABOLIC PANEL
Anion gap: 9 (ref 5–15)
BUN: 27 mg/dL — AB (ref 6–20)
CALCIUM: 7.3 mg/dL — AB (ref 8.9–10.3)
CO2: 29 mmol/L (ref 22–32)
CREATININE: 1.32 mg/dL — AB (ref 0.61–1.24)
Chloride: 98 mmol/L — ABNORMAL LOW (ref 101–111)
GFR calc non Af Amer: 54 mL/min — ABNORMAL LOW (ref >60)
Glucose, Bld: 91 mg/dL (ref 65–99)
Potassium: 3.3 mmol/L — ABNORMAL LOW (ref 3.5–5.1)
Sodium: 136 mmol/L (ref 135–145)

## 2017-03-29 LAB — CBC
HCT: 25.4 % — ABNORMAL LOW (ref 39.0–52.0)
Hemoglobin: 8.2 g/dL — ABNORMAL LOW (ref 13.0–17.0)
MCH: 29.3 pg (ref 26.0–34.0)
MCHC: 32.3 g/dL (ref 30.0–36.0)
MCV: 90.7 fL (ref 78.0–100.0)
Platelets: 120 10*3/uL — ABNORMAL LOW (ref 150–400)
RBC: 2.8 MIL/uL — ABNORMAL LOW (ref 4.22–5.81)
RDW: 15.5 % (ref 11.5–15.5)
WBC: 9 10*3/uL (ref 4.0–10.5)

## 2017-03-29 MED ORDER — FA-PYRIDOXINE-CYANOCOBALAMIN 2.5-25-2 MG PO TABS
1.0000 | ORAL_TABLET | Freq: Every day | ORAL | Status: DC
  Administered 2017-03-29 – 2017-04-09 (×11): 1 via ORAL
  Filled 2017-03-29 (×12): qty 1

## 2017-03-29 MED ORDER — POTASSIUM CHLORIDE 10 MEQ/50ML IV SOLN
10.0000 meq | INTRAVENOUS | Status: AC | PRN
  Administered 2017-03-29 (×3): 10 meq via INTRAVENOUS
  Filled 2017-03-29 (×3): qty 50

## 2017-03-29 MED ORDER — MOVING RIGHT ALONG BOOK
Freq: Once | Status: AC
  Administered 2017-03-29: 09:00:00
  Filled 2017-03-29: qty 1

## 2017-03-29 MED ORDER — ENOXAPARIN SODIUM 30 MG/0.3ML ~~LOC~~ SOLN
30.0000 mg | SUBCUTANEOUS | Status: DC
  Administered 2017-03-30 – 2017-04-09 (×10): 30 mg via SUBCUTANEOUS
  Filled 2017-03-29 (×10): qty 0.3

## 2017-03-29 MED ORDER — POTASSIUM CHLORIDE CRYS ER 20 MEQ PO TBCR: 30.0000 meq | EXTENDED_RELEASE_TABLET | Freq: Once | ORAL | Status: DC

## 2017-03-29 MED ORDER — SODIUM CHLORIDE 0.9% FLUSH
3.0000 mL | INTRAVENOUS | Status: DC | PRN
  Administered 2017-03-30: 3 mL via INTRAVENOUS
  Filled 2017-03-29: qty 3

## 2017-03-29 MED ORDER — FUROSEMIDE 10 MG/ML IJ SOLN
40.0000 mg | Freq: Two times a day (BID) | INTRAMUSCULAR | Status: DC
  Administered 2017-03-29: 40 mg via INTRAVENOUS
  Filled 2017-03-29 (×2): qty 4

## 2017-03-29 MED ORDER — POTASSIUM CHLORIDE CRYS ER 20 MEQ PO TBCR
40.0000 meq | EXTENDED_RELEASE_TABLET | Freq: Once | ORAL | Status: AC
  Administered 2017-03-29: 40 meq via ORAL
  Filled 2017-03-29: qty 2

## 2017-03-29 MED ORDER — SODIUM CHLORIDE 0.9 % IV SOLN: 250.0000 mL | INTRAVENOUS | Status: DC | PRN

## 2017-03-29 MED ORDER — SODIUM CHLORIDE 0.9% FLUSH
3.0000 mL | Freq: Two times a day (BID) | INTRAVENOUS | Status: DC
  Administered 2017-03-29 – 2017-04-06 (×10): 3 mL via INTRAVENOUS

## 2017-03-29 NOTE — Anesthesia Postprocedure Evaluation (Addendum)
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  Bentall procedure.  Aortic root repleacement with bioprosthetic valve.  Reimplantation of left and right coronary arteries.  Total resection of transverse aortic arch.  Elephant trunk distal anastomosis and debranching of arch vessels. (N/A) TRANSESOPHAGEAL ECHOCARDIOGRAM (TEE) (N/A)     Patient location during evaluation: SICU Anesthesia Type: General Level of consciousness: awake and alert and patient cooperative (knows name, birthday, some circumstance) Pain management: pain level controlled Vital Signs Assessment: post-procedure vital signs reviewed and stable Respiratory status: patient connected to nasal cannula oxygen, nonlabored ventilation, spontaneous breathing and respiratory function stable Cardiovascular status: blood pressure returned to baseline and stable Postop Assessment: adequate PO intake and no signs of nausea or vomiting (ambulating in halls) Anesthetic complications: no    Last Vitals:  Vitals:   03/29/17 1200 03/29/17 1300  BP: (!) 104/52   Pulse: 72 73  Resp: 17 20  Temp:    SpO2: 96% 95%    Last Pain:  Vitals:   03/29/17 1141  TempSrc: Oral  PainSc:                  Abir Craine,E. Meleane Selinger

## 2017-03-29 NOTE — Anesthesia Postprocedure Evaluation (Signed)
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  Bentall procedure.  Aortic root repleacement with bioprosthetic valve.  Reimplantation of left and right coronary arteries.  Total resection of transverse aortic arch.  Elephant trunk distal anastomosis and debranching of arch vessels. (N/A) TRANSESOPHAGEAL ECHOCARDIOGRAM (TEE) (N/A)     Patient location during evaluation: SICU Anesthesia Type: General Level of consciousness: sedated and patient remains intubated per anesthesia plan Pain management: pain level controlled Vital Signs Assessment: post-procedure vital signs reviewed and stable Respiratory status: patient remains intubated per anesthesia plan and patient on ventilator - see flowsheet for VS Cardiovascular status: stable Postop Assessment: no signs of nausea or vomiting Anesthesia complication: no apparent anesthetic issues.    Last Vitals:  Vitals:   03/29/17 1200 03/29/17 1300  BP: (!) 104/52   Pulse: 72 73  Resp: 17 20  Temp:    SpO2: 96% 95%    Last Pain:  Vitals:   03/29/17 1141  TempSrc: Oral  PainSc:                  Dessie Delcarlo,E. Libi Corso

## 2017-03-29 NOTE — Progress Notes (Signed)
      [address]       [place] [address]             [phone]      Sleeping currently   BP 132/63   Pulse 77   Temp 97.7 F (36.5 C) (Oral)   Resp (!) 21   Ht 6\' 1"  (1.854 m)   Wt 213 lb 3 oz (96.7 kg)   SpO2 94%   BMI 28.13 kg/m    Intake/Output Summary (Last 24 hours) at 03/29/17 1800 Last data filed at 03/29/17 1700  Gross per 24 hour  Intake              543 ml  Output              660 ml  Net             -117 ml   No new issues today. Continue present management  Remo Lipps C. Roxan Hockey, MD [facility] [phone]

## 2017-03-29 NOTE — Progress Notes (Addendum)
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  Bentall procedure.  Aortic root repleacement with bioprosthetic valve.  Reimplantation of left and right coronary arteries.  Total resection of transverse aortic arch.  Elephant trunk distal anastomosis and debranching of arch vessels. (N/A) TRANSESOPHAGEAL ECHOCARDIOGRAM (TEE) (N/A)  Total Length of Stay:  LOS: 5 days   Subjective: Patient sitting in chair. Able to expectorate sputum.  Objective: Vital signs in last 24 hours: Temp:  [97.5 F (36.4 C)-100.1 F (37.8 C)] 98.9 F (37.2 C) (08/20 0757) Pulse Rate:  [63-83] 75 (08/20 0700) Cardiac Rhythm: Normal sinus rhythm (08/20 0030) Resp:  [16-26] 19 (08/20 0642) BP: (75-127)/(37-74) 127/54 (08/20 0700) SpO2:  [85 %-100 %] 92 % (08/20 0700) Weight:  [96.7 kg (213 lb 3 oz)-97.4 kg (214 lb 11.7 oz)] 96.7 kg (213 lb 3 oz) (08/20 0700)  Filed Weights   03/28/17 0500 03/29/17 0500 03/29/17 0700  Weight: 96.4 kg (212 lb 9.6 oz) 97.4 kg (214 lb 11.7 oz) 96.7 kg (213 lb 3 oz)    Weight change: 0.965 kg (2 lb 2.1 oz)      Intake/Output from previous day: 08/19 0701 - 08/20 0700 In: 1153 [P.O.:840; I.V.:13; IV Piggyback:300] Out: 1485 [Urine:1385]  Intake/Output this shift: No intake/output data recorded.  Current Meds: Scheduled Meds: . acetaminophen  1,000 mg Oral Q6H  . aspirin EC  325 mg Oral Daily  . bisacodyl  10 mg Oral Daily   Or  . bisacodyl  10 mg Rectal Daily  . Chlorhexidine Gluconate Cloth  6 each Topical Daily  . docusate sodium  200 mg Oral Daily  . flecainide  100 mg Oral BID  . furosemide  40 mg Intravenous Q8H  . ipratropium-albuterol  3 mL Nebulization Q6H  . mouth rinse  15 mL Mouth Rinse BID  . nicotine  14 mg Transdermal Daily  . pantoprazole  40 mg Oral Daily  . sodium  chloride flush  10-40 mL Intracatheter Q12H  . sodium chloride flush  3 mL Intravenous Q12H  . sodium chloride flush  3 mL Intravenous Q12H   Continuous Infusions: . sodium chloride 250 mL (03/25/17 0600)  . sodium chloride    . lactated ringers Stopped (03/27/17 1400)  . lactated ringers    . meropenem (MERREM) IV Stopped (03/29/17 0704)  . potassium chloride     PRN Meds:.sodium chloride, labetalol, morphine injection, ondansetron (ZOFRAN) IV, oxyCODONE, potassium chloride, sodium chloride flush, sodium chloride flush, sodium chloride flush, traMADol  General appearance: alert, cooperative and no distress Heart: RRR Lungs: Coarse, rhonchi breath sounds bilaterally Abdomen: Soft, non tender, bowel sounds present Extremities: Mild LE edema Wounds: Clean and dry  Lab Results: CBC: Recent Labs  03/28/17 0433 03/29/17 0547  WBC 10.3 9.0  HGB 9.0* 8.2*  HCT 28.0* 25.4*  PLT 107* 120*   BMET:  Recent Labs  03/28/17 0433 03/29/17 0547  NA 137 136  K 3.8 3.3*  CL 100* 98*  CO2 31 29  GLUCOSE 110* 91  BUN 26* 27*  CREATININE 1.21 1.32*  CALCIUM 7.5* 7.3*    CMET: Lab Results  Component Value Date   WBC 9.0 03/29/2017  HGB 8.2 (L) 03/29/2017   HCT 25.4 (L) 03/29/2017   PLT 120 (L) 03/29/2017   GLUCOSE 91 03/29/2017   CHOL 162 10/05/2016   TRIG 105.0 10/05/2016   HDL 43.80 10/05/2016   LDLDIRECT 137.9 04/24/2013   LDLCALC 97 10/05/2016   ALT 64 (H) 03/25/2017   AST 183 (H) 03/25/2017   NA 136 03/29/2017   K 3.3 (L) 03/29/2017   CL 98 (L) 03/29/2017   CREATININE 1.32 (H) 03/29/2017   BUN 27 (H) 03/29/2017   CO2 29 03/29/2017   TSH 1.00 03/12/2016   PSA 7.18 (H) 01/10/2016   INR 1.09 03/24/2017   HGBA1C 5.5 10/28/2011      PT/INR: No results for input(s): LABPROT, INR in the last 72 hours. Radiology: Dg Chest Port 1 View  Result Date: 03/29/2017 CLINICAL DATA:  Status post ascending it thoracic aortic dissection repair 6 days ago than tall procedure  with aortic root and valve replacement and coronary artery reimplantation EXAM: PORTABLE CHEST 1 VIEW COMPARISON:  Chest x-ray of March 28, 2017 FINDINGS: The lungs are well-expanded. No left apical pneumothorax is observed today. The interstitial markings remain increased in both lungs and are slightly more conspicuous today in part due to decreased motion unsharpness. There is no discrete infiltrate. There is no significant pleural effusion. The bilateral chest tubes have been removed. The cardiac silhouette remains enlarged. The ascending thoracic aorta remains tortuous. The prosthetic aortic valve is in reasonable position. The right internal jugular Cordis sheath tip projects over the proximal SVC. IMPRESSION: CHF with mild interstitial edema. Interval removal of bilateral chest tubes. No residual pneumothorax. No significant pleural effusions. Electronically Signed   By: David  Martinique M.D.   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  Bentall procedure.  Aortic root repleacement with bioprosthetic valve.  Reimplantation of left and right coronary arteries.  Total resection of transverse aortic arch.  Elephant trunk distal anastomosis and debranching of arch vessels. (N/A) TRANSESOPHAGEAL ECHOCARDIOGRAM (TEE) (N/A)  1. CV-SR in the 70's this am. On Flecainide 2. Pulmonary-On 10 liters of oxygen via [place]. CXR this am shows CHF, mild interstitial edema, no pneumothorax, cardiomegaly. Check CXR in am. Continue Duonebs. Encourage incentive spirometer and flutter valve.  3. Volume overload-on Lasix 40 mg IV tid 4. ABL anemia-H and H decreased to 8.2 and 25.4. Start Foltx 5. Thrombocytopenia-platelets up to 120,000 6. Creatinine slightly increased to 1.32. Monitor as diuresing 7. Hypokalemia-supplement orally 8. ID-continue Meropenem. Sputum culture pending. Gram stain sputum few gram pos rods, rare yeast and gram positive  cocci. 9. Will order PT/OT to assist with ambulation 10. Will discuss with Dr. Roxy Manns if able to remove IJ cathter s has peripheral   ZIMMERMAN,DONIELLE M PA-C 03/29/2017 8:07 AM   I have seen and examined the patient and agree with the assessment and plan as outlined.  D/C central line.  Mobilize.  Diuresis.  Pulm toilet.  Follow up sputum cultures and continue empiric Abx for now although afebrile, WBC normal, and CXR w/out infiltrate  Rexene Alberts, MD 03/29/2017 8:31 AM

## 2017-03-29 NOTE — Evaluation (Signed)
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  Bentall procedure.  Aortic root repleacement with bioprosthetic valve.  Reimplantation of left and right coronary arteries.  Total resection of transverse aortic arch.   Past Medical History:  Diagnosis Date  . Allergy   . Arthritis   . 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 Atrial fibrillation ([place])   . Cataract    removed both eyes   . GERD (gastroesophageal reflux disease)   . HYPERLIPIDEMIA   . 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 Hypertension   . Microscopic hematuria    negative cystoscopy  . NONSPECIFIC ABNORMAL ELECTROCARDIOGRAM   . S/P aortic dissection repair 03/24/2017   Biological Bentall aortic root replacement + resection and grafting of entire ascending aorta, transverse aortic arch and proximal descending thoracic aorta with elephant trunk distal anastomosis and debranching of aortic arch vessels  . 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 Varicose veins     Clinical Impression  Pt admitted with above diagnosis. Pt currently with functional limitations due to the deficits listed below (see PT Problem List). Pt was able to ambulate on unit with +2 min to mod assist with Harmon Pier walker.  Followed with chair for safety.  Pt on 8LHFNC and sats 90-94%.  Other VSS.  Will follow acutely.   Pt will benefit from skilled PT to increase their independence and safety with mobility to allow discharge to the venue listed below.      Follow Up Recommendations Home health PT;Supervision/Assistance - 24 hour    Equipment Recommendations  None recommended by PT     Recommendations for Other Services       Precautions / Restrictions Precautions Precautions: Fall;Sternal Restrictions Weight Bearing Restrictions: No      Mobility  Bed Mobility Overal bed mobility: Needs Assistance Bed Mobility: Rolling;Sidelying to Sit Rolling: Min assist Sidelying to sit: Mod assist;+2 for physical assistance       General bed mobility comments: Needed assist for elevation of trunk and for LEs using pad.  Cues for sternal precautions.   Transfers Overall transfer level: Needs assistance Equipment used: Rolling walker (2 wheeled) Transfers: Sit to/from Stand Sit to Stand: Mod assist;Min assist;From elevated surface         General transfer comment: Pt needed assit to power up and incr cues to follow sternal precautions.   Ambulation/Gait Ambulation/Gait assistance: Mod assist;Min assist;+2 safety/equipment Ambulation Distance (Feet): 190 Feet Assistive device:  (Eva walker) Gait Pattern/deviations: Decreased stride length;Decreased step length - right;Decreased step length - left;Shuffle;Drifts right/left;Trunk flexed;Wide base of support   Gait velocity interpretation: Below normal speed for age/gender General Gait Details: Pt able to ambulate with [name] with cues to stand tall as he flexes.  Also cues to stay close to [facility] walker.  Fatigues.  Needs to be pushed  Stairs            Wheelchair Mobility    Modified Rankin (Stroke Patients Only)       Balance Overall balance assessment: Needs assistance Sitting-balance support: No upper extremity supported;Feet supported Sitting balance-[name]: Good     Standing balance support: Bilateral upper extremity supported;During  functional activity Standing balance-[name]: Poor Standing balance comment: relies on Eva walker for balance and support                             Pertinent Vitals/Pain Pain Assessment: 0-10 Pain Score: 6  Pain Location: incision Pain  Descriptors / Indicators: Aching;Grimacing;Guarding Pain Intervention(s): Limited activity within patient's tolerance;Monitored during session;Premedicated before session;Repositioned    Home Living Family/patient expects to be discharged to:: Private residence Living Arrangements: Spouse/significant other Available Help at Discharge: Family;Available 24 hours/day Type of Home: House Home Access: Stairs to enter Entrance Stairs-Rails: Right;Left;Can reach both Entrance Stairs-Number of Steps: 4 +4 Home Layout: One level Home Equipment: Walker - 4 wheels;Bedside commode      Prior Function Level of Independence: Independent               Hand Dominance        Extremity/Trunk Assessment   Upper Extremity Assessment Upper Extremity Assessment: Defer to OT evaluation    Lower Extremity Assessment Lower Extremity Assessment: RLE deficits/detail;LLE deficits/detail RLE Deficits / Details: grossly 3-/5 LLE Deficits / Details: grossly 3-/5    Cervical / Trunk Assessment Cervical / Trunk Assessment: Kyphotic  Communication   Communication: No difficulties  Cognition Arousal/Alertness: Lethargic;Suspect due to medications Behavior During Therapy: Flat affect Overall Cognitive Status: Impaired/Different from baseline Area of Impairment: Following commands;Awareness;Problem solving                       Following Commands: Follows one step commands with increased time   Awareness: Intellectual Problem Solving: Slow processing;Decreased initiation;Difficulty sequencing;Requires verbal cues;Requires tactile cues        General Comments      Exercises General Exercises - Lower Extremity Ankle Circles/Pumps: AROM;Both;5 reps;Supine Long Arc Quad: AROM;Both;10 reps;Seated   Assessment/Plan    PT Assessment Patient needs continued PT services  PT Problem List Decreased strength;Decreased range of motion;Decreased activity tolerance;Decreased  balance;Decreased mobility;Decreased cognition;Decreased knowledge of use of DME;Decreased safety awareness;Decreased knowledge of precautions;Cardiopulmonary status limiting activity       PT Treatment Interventions DME instruction;Gait training;Functional mobility training;Therapeutic activities;Stair training;Therapeutic exercise;Balance training;Patient/family education    PT Goals (Current goals can be found in the Care Plan section)  Acute Rehab PT Goals Patient Stated Goal: to get better PT Goal Formulation: With patient Time For Goal Achievement: 04/12/17 Potential to Achieve Goals: Good    Frequency Min 3X/week   Barriers to discharge        Co-evaluation               AM-PAC PT "6 Clicks" Daily Activity  Outcome Measure Difficulty turning over in bed (including adjusting bedclothes, sheets and blankets)?: Unable Difficulty moving from lying on back to sitting on the side of the bed? : Unable Difficulty sitting down on and standing up from a chair with arms (e.g., wheelchair, bedside commode, etc,.)?: A Lot Help needed moving to and from a bed to chair (including a wheelchair)?: A Lot Help needed walking in hospital room?: A Lot Help needed climbing 3-5 steps with a railing? : Total 6 Click Score: 9    End of Session Equipment Utilized During Treatment: Gait belt;Oxygen Activity Tolerance: Patient limited by fatigue Patient left: in chair;with call bell/phone within reach;with chair alarm set;with family/visitor present Nurse Communication: Mobility status PT Visit Diagnosis: Unsteadiness on feet (R26.81);Muscle weakness (generalized) (M62.81)    Time: 1601-0932 PT Time Calculation (min) (ACUTE  ONLY): 27 min   Charges:   PT Evaluation $PT Eval Moderate Complexity: 1 Mod PT Treatments $Gait Training: 8-22 mins   PT G Codes:        Cheryl Chay,PT Acute Rehabilitation [phone] (pager)   Denice Paradise 03/29/2017, 3:50 PM

## 2017-03-29 NOTE — Progress Notes (Signed)
Removed patients epicardial pacing wires at this time per MD order. RN will continue to monitor.

## 2017-03-30 ENCOUNTER — Other Ambulatory Visit: Payer: Self-pay | Admitting: Acute Care

## 2017-03-30 ENCOUNTER — Inpatient Hospital Stay (HOSPITAL_COMMUNITY): Payer: Medicare Other

## 2017-03-30 DIAGNOSIS — Z122 Encounter for screening for malignant neoplasm of respiratory organs: Secondary | ICD-10-CM

## 2017-03-30 DIAGNOSIS — F17210 Nicotine dependence, cigarettes, uncomplicated: Principal | ICD-10-CM

## 2017-03-30 LAB — CBC
HCT: 28.7 % — ABNORMAL LOW (ref 39.0–52.0)
Hemoglobin: 9.4 g/dL — ABNORMAL LOW (ref 13.0–17.0)
MCH: 29.4 pg (ref 26.0–34.0)
MCHC: 32.8 g/dL (ref 30.0–36.0)
MCV: 89.7 fL (ref 78.0–100.0)
PLATELETS: 168 10*3/uL (ref 150–400)
RBC: 3.2 MIL/uL — AB (ref 4.22–5.81)
RDW: 15.4 % (ref 11.5–15.5)
WBC: 9.3 10*3/uL (ref 4.0–10.5)

## 2017-03-30 LAB — BLOOD GAS, ARTERIAL
Acid-Base Excess: 3.7 mmol/L — ABNORMAL HIGH (ref 0.0–2.0)
Bicarbonate: 26.8 mmol/L (ref 20.0–28.0)
Drawn by: 51150
O2 Content: 12 L/min
O2 Saturation: 89 %
Patient temperature: 98.6
pCO2 arterial: 34.3 mmHg (ref 32.0–48.0)
pH, Arterial: 7.505 — ABNORMAL HIGH (ref 7.350–7.450)
pO2, Arterial: 57.1 mmHg — ABNORMAL LOW (ref 83.0–108.0)

## 2017-03-30 LAB — COMPREHENSIVE METABOLIC PANEL
ALBUMIN: 2.2 g/dL — AB (ref 3.5–5.0)
ALT: 59 U/L (ref 17–63)
AST: 83 U/L — AB (ref 15–41)
Alkaline Phosphatase: 243 U/L — ABNORMAL HIGH (ref 38–126)
Anion gap: 12 (ref 5–15)
BUN: 25 mg/dL — ABNORMAL HIGH (ref 6–20)
CALCIUM: 7.7 mg/dL — AB (ref 8.9–10.3)
CHLORIDE: 99 mmol/L — AB (ref 101–111)
CO2: 26 mmol/L (ref 22–32)
Creatinine, Ser: 1.24 mg/dL (ref 0.61–1.24)
GFR calc Af Amer: >60 mL/min (ref >60)
GFR calc non Af Amer: 58 mL/min — ABNORMAL LOW (ref >60)
Glucose, Bld: 104 mg/dL — ABNORMAL HIGH (ref 65–99)
POTASSIUM: 3.7 mmol/L (ref 3.5–5.1)
SODIUM: 137 mmol/L (ref 135–145)
TOTAL PROTEIN: 5.2 g/dL — AB (ref 6.5–8.1)
Total Bilirubin: 2 mg/dL — ABNORMAL HIGH (ref 0.3–1.2)

## 2017-03-30 LAB — ECHO INTRAOPERATIVE TEE
Height: 73 in
WEIGHTICAEL: 3088 [oz_av]

## 2017-03-30 MED ORDER — FUROSEMIDE 10 MG/ML IJ SOLN: 80.0000 mg | Freq: Two times a day (BID) | INTRAMUSCULAR | Status: DC

## 2017-03-30 MED ORDER — POTASSIUM CHLORIDE CRYS ER 20 MEQ PO TBCR
30.0000 meq | EXTENDED_RELEASE_TABLET | Freq: Once | ORAL | Status: AC
  Administered 2017-03-30: 30 meq via ORAL
  Filled 2017-03-30: qty 1

## 2017-03-30 MED ORDER — METHYLPREDNISOLONE SODIUM SUCC 125 MG IJ SOLR
80.0000 mg | Freq: Once | INTRAMUSCULAR | Status: AC
  Administered 2017-03-30: 80 mg via INTRAVENOUS

## 2017-03-30 MED ORDER — METOLAZONE 5 MG PO TABS
5.0000 mg | ORAL_TABLET | Freq: Once | ORAL | Status: AC
  Administered 2017-03-30: 5 mg via ORAL
  Filled 2017-03-30: qty 1

## 2017-03-30 MED ORDER — FUROSEMIDE 10 MG/ML IJ SOLN
80.0000 mg | Freq: Two times a day (BID) | INTRAMUSCULAR | Status: DC
  Administered 2017-03-30 – 2017-04-01 (×6): 80 mg via INTRAVENOUS
  Filled 2017-03-30 (×6): qty 8

## 2017-03-30 MED ORDER — FUROSEMIDE 10 MG/ML IJ SOLN
40.0000 mg | Freq: Once | INTRAMUSCULAR | Status: AC
  Administered 2017-03-30: 40 mg via INTRAVENOUS

## 2017-03-30 MED ORDER — FUROSEMIDE 10 MG/ML IJ SOLN
INTRAMUSCULAR | Status: AC
  Filled 2017-03-30: qty 4

## 2017-03-30 MED ORDER — METHYLPREDNISOLONE SODIUM SUCC 125 MG IJ SOLR
INTRAMUSCULAR | Status: AC
  Filled 2017-03-30: qty 2

## 2017-03-30 MED ORDER — POTASSIUM CHLORIDE CRYS ER 20 MEQ PO TBCR
20.0000 meq | EXTENDED_RELEASE_TABLET | ORAL | Status: DC | PRN
  Administered 2017-03-31: 20 meq via ORAL
  Filled 2017-03-30: qty 1

## 2017-03-30 NOTE — Progress Notes (Signed)
Pt ambulated 190 ft and is now back to his chair. Patient is on 4LNC. Pt tolerated fairly well. Pt refusing to walk any further. Pt encouraged and educated on importance of ambulation. Pt still refusing. Will monitor closely, and try again shortly.   Lucius Conn, RN

## 2017-03-30 NOTE — Progress Notes (Addendum)
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  Bentall procedure.  Aortic root repleacement with bioprosthetic valve.  Reimplantation of left and right coronary arteries.  Total resection of transverse aortic arch.  Elephant trunk distal anastomosis and debranching of arch vessels. (N/A) TRANSESOPHAGEAL ECHOCARDIOGRAM (TEE) (N/A)  Total Length of Stay:  LOS: 6 days   Subjective: Patient sitting in chair and he has no specific complaints this am.  Objective: Vital signs in last 24 hours: Temp:  [97.3 F (36.3 C)-100.3 F (37.9 C)] 98.9 F (37.2 C) (08/21 0300) Pulse Rate:  [70-91] 89 (08/21 0753) Cardiac Rhythm: Normal sinus rhythm (08/21 0753) Resp:  [16-29] 24 (08/21 0753) BP: (97-143)/(47-81) 132/68 (08/21 0700) SpO2:  [90 %-98 %] 96 % (08/21 0753) Weight:  [96.5 kg (212 lb 11.9 oz)] 96.5 kg (212 lb 11.9 oz) (08/21 0500)  Filed Weights   03/29/17 0500 03/29/17 0700 03/30/17 0500  Weight: 97.4 kg (214 lb 11.7 oz) 96.7 kg (213 lb 3 oz) 96.5 kg (212 lb 11.9 oz)    Weight change: -0.9 kg (-1 lb 15.8 oz)      Intake/Output from previous day: 08/20 0701 - 08/21 0700 In: 450 [P.O.:240; I.V.:110; IV Piggyback:100] Out: 1000 [Urine:1000]  Intake/Output this shift: No intake/output data recorded.  Current Meds: Scheduled Meds: . aspirin EC  325 mg Oral Daily  . bisacodyl  10 mg Oral Daily   Or  . bisacodyl  10 mg Rectal Daily  . Chlorhexidine Gluconate Cloth  6 each Topical Daily  . docusate sodium  200 mg Oral Daily  . enoxaparin (LOVENOX) injection  30 mg Subcutaneous Q24H  . flecainide  100 mg Oral BID  . folic acid-pyridoxine-cyancobalamin  1 tablet Oral Daily  . furosemide  40 mg Intravenous BID  . ipratropium-albuterol  3 mL Nebulization Q6H  . mouth rinse  15 mL Mouth Rinse BID  .  nicotine  14 mg Transdermal Daily  . pantoprazole  40 mg Oral Daily  . sodium chloride flush  10-40 mL Intracatheter Q12H  . sodium chloride flush  3 mL Intravenous Q12H  . sodium chloride flush  3 mL Intravenous Q12H   Continuous Infusions: . sodium chloride 250 mL (03/25/17 0600)  . sodium chloride    . meropenem (MERREM) IV Stopped (03/30/17 0551)   PRN Meds:.sodium chloride, labetalol, ondansetron (ZOFRAN) IV, oxyCODONE, potassium chloride, sodium chloride flush, sodium chloride flush, sodium chloride flush, traMADol  General appearance: alert, cooperative and no distress Heart: RRR Lungs: Coarse, rhonchi breath sounds bilaterally Abdomen: Soft, non tender, bowel sounds present Extremities: Mild LE edema. Swelling right hand-secondary to previous IV Wounds: Clean and dry  Lab Results: CBC:  Recent Labs  03/29/17 0547 03/30/17 0213  WBC 9.0 9.3  HGB 8.2* 9.4*  HCT 25.4* 28.7*  PLT 120* 168   BMET:   Recent Labs  03/29/17 0547 03/30/17 0213  NA 136 137  K 3.3* 3.7  CL 98* 99*  CO2 29 26  GLUCOSE 91 104*  BUN 27* 25*  CREATININE 1.32* 1.24  CALCIUM 7.3* 7.7*    CMET: Lab Results  Component Value Date   WBC 9.3 03/30/2017   HGB  9.4 (L) 03/30/2017   HCT 28.7 (L) 03/30/2017   PLT 168 03/30/2017   GLUCOSE 104 (H) 03/30/2017   CHOL 162 10/05/2016   TRIG 105.0 10/05/2016   HDL 43.80 10/05/2016   LDLDIRECT 137.9 04/24/2013   LDLCALC 97 10/05/2016   ALT 59 03/30/2017   AST 83 (H) 03/30/2017   NA 137 03/30/2017   K 3.7 03/30/2017   CL 99 (L) 03/30/2017   CREATININE 1.24 03/30/2017   BUN 25 (H) 03/30/2017   CO2 26 03/30/2017   TSH 1.00 03/12/2016   PSA 7.18 (H) 01/10/2016   INR 1.09 03/24/2017   HGBA1C 5.5 10/28/2011      PT/INR: No results for input(s): LABPROT, INR in the last 72 hours. Radiology: Dg Chest 2 View  Result Date: 03/30/2017 CLINICAL DATA:  CHF, ascending aortic dissection repair and aortic root replacement EXAM: CHEST  2 VIEW  COMPARISON:  Portable chest x-ray of March 29, 2017 FINDINGS: The lungs are reasonably well expanded. The interstitial markings remain increased but have improved. There small bilateral pleural effusions greatest on the right. The cardiac silhouette remains enlarged. The pulmonary vascularity remains engorged but is more distinct. The mediastinum remains widened. The sternal wires are intact. The prosthetic aortic valve ring appears to be in stable position. IMPRESSION: Slight interval improvement in pulmonary interstitial edema. Persistent bibasilar atelectasis with small effusions greatest on the right. Stable mediastinal prominence consistent with the postoperative state. Electronically Signed   By: David  Martinique M.D.   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  Bentall procedure.  Aortic root repleacement with bioprosthetic valve.  Reimplantation of left and right coronary arteries.  Total resection of transverse aortic arch.  Elephant trunk distal anastomosis and debranching of arch vessels. (N/A) TRANSESOPHAGEAL ECHOCARDIOGRAM (TEE) (N/A)  1. CV-SR in the 70's this am. On Flecainide 2. Pulmonary-Patient with long history of tobacco abuse. On 7 liters of oxygen via [place]. CXR this am shows some improvement in CHF, no pneumothorax, bibasilar atelectasis, small pleural effusions, and cardiomegaly. Check CXR in am. Continue Duonebs. Encourage incentive spirometer and flutter valve.  3. Volume overload-on Lasix 80 mg IV bid 4. ABL anemia-H and H increased to 9.4 and 28.7. Continue Foltx. 5. Thrombocytopenia resloved-platelets up to 168,000 6. Hypokalemia-supplement orally again today 7. ID-on Meropenem for empiric coverage. He remains afebrile and WBC "wnl". Sputum culture pending-re incubated. Gram stain sputum few gram pos rods, rare yeast and gram positive cocci. 8. Continue PT/OT 9. Will transfer once pulmonary status  more stable  ZIMMERMAN,DONIELLE M PA-C 03/30/2017 8:01 AM   I have seen and examined the patient and agree with the assessment and plan as outlined.  Patient looks good overall.  Still on HFNC but patient is a mouth breather and heavy smoker PTA.  Will transfer step down once oxygenation reasonably stable.  Increase lasix.  Mobilize.  Rexene Alberts, MD 03/30/2017 8:35 AM

## 2017-03-30 NOTE — Progress Notes (Signed)
CTS PM Note  Walked in hall today This pm with increasing O 2 sats Wheezes coarse BS-exrea lasix , solumedrol ordered May nee BIPAP- CXR ABG ordered Neuro intact extrem warm

## 2017-03-31 ENCOUNTER — Inpatient Hospital Stay (HOSPITAL_COMMUNITY): Payer: Medicare Other

## 2017-03-31 LAB — COMPREHENSIVE METABOLIC PANEL
ALT: 75 U/L — ABNORMAL HIGH (ref 17–63)
AST: 68 U/L — ABNORMAL HIGH (ref 15–41)
Albumin: 2.2 g/dL — ABNORMAL LOW (ref 3.5–5.0)
Alkaline Phosphatase: 225 U/L — ABNORMAL HIGH (ref 38–126)
Anion gap: 12 (ref 5–15)
BUN: 21 mg/dL — ABNORMAL HIGH (ref 6–20)
CO2: 26 mmol/L (ref 22–32)
Calcium: 7.7 mg/dL — ABNORMAL LOW (ref 8.9–10.3)
Chloride: 99 mmol/L — ABNORMAL LOW (ref 101–111)
Creatinine, Ser: 1.19 mg/dL (ref 0.61–1.24)
GFR calc Af Amer: >60 mL/min (ref >60)
GFR calc non Af Amer: >60 mL/min (ref >60)
Glucose, Bld: 155 mg/dL — ABNORMAL HIGH (ref 65–99)
Potassium: 3.5 mmol/L (ref 3.5–5.1)
Sodium: 137 mmol/L (ref 135–145)
Total Bilirubin: 2.1 mg/dL — ABNORMAL HIGH (ref 0.3–1.2)
Total Protein: 5.5 g/dL — ABNORMAL LOW (ref 6.5–8.1)

## 2017-03-31 LAB — BLOOD GAS, ARTERIAL
Acid-Base Excess: 3.9 mmol/L — ABNORMAL HIGH (ref 0.0–2.0)
Bicarbonate: 26.8 mmol/L (ref 20.0–28.0)
Delivery systems: POSITIVE
Drawn by: 51155
Expiratory PAP: 6
FIO2: 60
Inspiratory PAP: 12
O2 Saturation: 92.7 %
Patient temperature: 98.6
pCO2 arterial: 32.8 mmHg (ref 32.0–48.0)
pH, Arterial: 7.522 — ABNORMAL HIGH (ref 7.350–7.450)
pO2, Arterial: 64.2 mmHg — ABNORMAL LOW (ref 83.0–108.0)

## 2017-03-31 LAB — CULTURE, RESPIRATORY W GRAM STAIN: Culture: NORMAL

## 2017-03-31 LAB — CBC
HCT: 28.2 % — ABNORMAL LOW (ref 39.0–52.0)
Hemoglobin: 9.1 g/dL — ABNORMAL LOW (ref 13.0–17.0)
MCH: 29.2 pg (ref 26.0–34.0)
MCHC: 32.3 g/dL (ref 30.0–36.0)
MCV: 90.4 fL (ref 78.0–100.0)
Platelets: 252 10*3/uL (ref 150–400)
RBC: 3.12 MIL/uL — ABNORMAL LOW (ref 4.22–5.81)
RDW: 15.8 % — ABNORMAL HIGH (ref 11.5–15.5)
WBC: 17.6 10*3/uL — ABNORMAL HIGH (ref 4.0–10.5)

## 2017-03-31 MED ORDER — SODIUM CHLORIDE 0.9% FLUSH
3.0000 mL | Freq: Two times a day (BID) | INTRAVENOUS | Status: DC
  Administered 2017-03-31 – 2017-04-01 (×3): 3 mL via INTRAVENOUS

## 2017-03-31 MED ORDER — SODIUM CHLORIDE 0.9 % IV SOLN: 250.0000 mL | INTRAVENOUS | Status: DC | PRN

## 2017-03-31 MED ORDER — POTASSIUM CHLORIDE CRYS ER 20 MEQ PO TBCR
40.0000 meq | EXTENDED_RELEASE_TABLET | Freq: Once | ORAL | Status: AC
  Administered 2017-03-31: 40 meq via ORAL
  Filled 2017-03-31: qty 2

## 2017-03-31 MED ORDER — SODIUM CHLORIDE 0.9% FLUSH: 3.0000 mL | INTRAVENOUS | Status: DC | PRN

## 2017-03-31 MED ORDER — MOVING RIGHT ALONG BOOK
Freq: Once | Status: DC
  Filled 2017-03-31: qty 1

## 2017-03-31 MED ORDER — LABETALOL HCL 200 MG PO TABS
200.0000 mg | ORAL_TABLET | Freq: Two times a day (BID) | ORAL | Status: DC
  Administered 2017-03-31 – 2017-04-01 (×4): 200 mg via ORAL
  Filled 2017-03-31 (×4): qty 1

## 2017-03-31 MED ORDER — IOPAMIDOL (ISOVUE-370) INJECTION 76%
INTRAVENOUS | Status: AC
  Administered 2017-03-31: 100 mL
  Filled 2017-03-31: qty 100

## 2017-03-31 NOTE — Progress Notes (Signed)
Physical Therapy Treatment Patient Details Name: Sean Riley MRN: [id number] DOB: Jul 07, 1951 Today's Date: 03/31/2017    History of Present Illness REPAIR OF ACUTE ASCENDING THORACIC AORTIC DISSECTION.  Bentall procedure.  Aortic root repleacement with bioprosthetic valve.  Reimplantation of left and right coronary arteries.  Total resection of transverse aortic arch.      PT Comments    Pt admitted with above diagnosis. Pt currently with functional limitations due to the deficits listed below (see PT Problem List). Pt was able to ambulate with Sean Riley walker on unit with incr distance today.  Needed standing rest breaks and pursed lip breathing on 15 LHFNC.  Pt encouraged to work on incentive spirometer and flutter valve.   Pt will benefit from skilled PT to increase their independence and safety with mobility to allow discharge to the venue listed below.     Follow Up Recommendations  Home health PT;Supervision/Assistance - 24 hour     Equipment Recommendations  None recommended by PT    Recommendations for Other Services       Precautions / Restrictions Precautions Precautions: Fall;Sternal Restrictions Weight Bearing Restrictions: No    Mobility  Bed Mobility Overal bed mobility: Needs Assistance Bed Mobility: Rolling;Sidelying to Sit Rolling: Min assist Sidelying to sit: Mod assist;+2 for physical assistance       General bed mobility comments: Needed assist for elevation of trunk and for LEs using pad.  Cues for sternal precautions.   Transfers Overall transfer level: Needs assistance Equipment used:  (EVa walker) Transfers: Sit to/from Stand Sit to Stand: Min assist;From elevated surface         General transfer comment: Pt needed assit to power up and incr cues to follow sternal precautions.   Ambulation/Gait Ambulation/Gait assistance: Min assist;+2 safety/equipment Ambulation Distance (Feet): 290 Feet Assistive device:  (Eva walker) Gait  Pattern/deviations: Decreased stride length;Decreased step length - right;Decreased step length - left;Shuffle;Drifts right/left;Trunk flexed;Wide base of support   Gait velocity interpretation: Below normal speed for age/gender General Gait Details: Pt able to ambulate with Sean Riley with cues to stand tall as he flexes.  Also cues to stay close to [name] walker.  Fatigues.  Pt was on 15 L HFNC needing several standing rest breaks to keep sats >90%.  Reviewed incentive spirometer and flutter valve on return to room.  of note, sats >92% once pt in chair and initially in bed they were 88% on 15LHFNC.     Stairs            Wheelchair Mobility    Modified Rankin (Stroke Patients Only)       Balance Overall balance assessment: Needs assistance Sitting-balance support: No upper extremity supported;Feet supported Sitting balance-[name]: Good     Standing balance support: Bilateral upper extremity supported;During functional activity Standing balance-[name]: Poor Standing balance comment: relies on Eva walker for balance and support                            Cognition Arousal/Alertness: Awake/alert Behavior During Therapy: Flat affect Overall Cognitive Status: Impaired/Different from baseline Area of Impairment: Following commands;Awareness;Problem solving                       Following Commands: Follows one step commands with increased time   Awareness: Intellectual Problem Solving: Slow processing;Decreased initiation;Difficulty sequencing;Requires verbal cues;Requires tactile cues        Exercises General Exercises - Lower Extremity  Ankle Circles/Pumps: AROM;Both;5 reps;Supine Long Arc Quad: AROM;Both;Seated;5 reps Hip Flexion/Marching: AROM;Both;5 reps;Seated    General Comments        Pertinent Vitals/Pain Pain Assessment: Faces Faces Pain Scale: Hurts little more Pain Location: incision Pain Descriptors / Indicators:  Aching;Grimacing;Guarding Pain Intervention(s): Limited activity within patient's tolerance;Monitored during session;Premedicated before session;Repositioned    Home Living                      Prior Function            PT Goals (current goals can now be found in the care plan section) Progress towards PT goals: Progressing toward goals    Frequency    Min 3X/week      PT Plan Current plan remains appropriate    Co-evaluation PT/OT/SLP Co-Evaluation/Treatment: Yes Reason for Co-Treatment: For patient/therapist safety PT goals addressed during session: Mobility/safety with mobility        AM-PAC PT "6 Clicks" Daily Activity  Outcome Measure  Difficulty turning over in bed (including adjusting bedclothes, sheets and blankets)?: Unable Difficulty moving from lying on back to sitting on the side of the bed? : A Lot Difficulty sitting down on and standing up from a chair with arms (e.g., wheelchair, bedside commode, etc,.)?: A Little Help needed moving to and from a bed to chair (including a wheelchair)?: A Little Help needed walking in hospital room?: A Lot Help needed climbing 3-5 steps with a railing? : Total 6 Click Score: 12    End of Session Equipment Utilized During Treatment: Gait belt;Oxygen Activity Tolerance: Patient limited by fatigue Patient left: in chair;with call bell/phone within reach;with chair alarm set;with family/visitor present Nurse Communication: Mobility status PT Visit Diagnosis: Unsteadiness on feet (R26.81);Muscle weakness (generalized) (M62.81)     Time: 9798-9211 PT Time Calculation (min) (ACUTE ONLY): 24 min  Charges:  $Gait Training: 8-22 mins                    G Codes:       Sean Riley,PT Acute Rehabilitation [phone] (pager)    Sean Riley 03/31/2017, 11:46 AM

## 2017-03-31 NOTE — Progress Notes (Signed)
Pharmacy Antibiotic Note  Sean Riley is a 66 y.o. male admitted on 03/23/2017 with pneumonia.  Pharmacy has been consulted for meropenem dosing. Cultures show normal flora, WBC elevated this morning but likely 2/2 steroids, renal function stable.  Plan: -Meropenem 1g IV q8h -Monitor LOT - could likely D/C empiric antibiotics  Height: 6\' 1"  (185.4 cm) Weight: 213 lb 13.5 oz (97 kg) IBW/kg (Calculated) : 79.9  Temp (24hrs), Avg:99.3 F (37.4 C), Min:98.6 F (37 C), Max:100.2 F (37.9 C)   Recent Labs Lab 03/27/17 0444 03/28/17 0433 03/29/17 0547 03/30/17 0213 03/31/17 0253  WBC 15.8* 10.3 9.0 9.3 17.6*  CREATININE 1.26* 1.21 1.32* 1.24 1.19    Estimated Creatinine Clearance: 73.9 mL/min (by C-G formula based on SCr of 1.19 mg/dL).    Allergies  Allergen Reactions  . Simvastatin Other (See Comments)    Mental status changes  . Celecoxib Other (See Comments)    GI UPSET AND INFLAMMATION  . Codeine Other (See Comments)    GI UPSET AND INFLAMMATION  . Nsaids Other (See Comments)    GI UPSET AND INFLAMMATION (can tolerate via IV)  . Tape Other (See Comments)    Medical tape and Band-Aids PULL OFF THE SKIN; please use Coban wrap  . Cephalexin Itching and Rash  . Latex Rash    Antimicrobials this admission:  Vanc 8/15 > 8/16 post op Levaquin 8/15 > 8/16 post op Meropenem 8/19 >   Dose adjustments this admission:   Microbiology results:  8/19 Sputum: normal flora 8/15 MRSA PCR: neg  Thank you for allowing pharmacy to be a part of this patient's care.  Arrie Senate, PharmD PGY-2 Cardiology Pharmacy Resident Pager: [phone] 03/31/2017

## 2017-03-31 NOTE — Progress Notes (Signed)
Patient's O2 sats ranging mid to high 80's on 12L high flow nasal cannula. Adjusted patient up in bed, encouraged deep breaths and IS with not much increase in sats. Pt in no acute distress. Paged RT who states to increase him to 15L and she will be up shortly to assess patient.

## 2017-03-31 NOTE — Evaluation (Addendum)
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  Bentall procedure.  Aortic root repleacement with bioprosthetic valve.  Reimplantation of left and right coronary arteries.  Total resection of transverse aortic arch.     Clinical Impression   Pt admitted with above. He demonstrates the below listed deficits and will benefit from continued OT to maximize safety and independence with BADLs.  Pt presents to OT with generalized weakness, decreased activity tolerance, impaired balance, and cognitive deficits, as well as incisional pain.  He requires mod - total A for ADLs and min A +2 for functional mobility.  02 sats 86-94% on 15L HiFlo 02 - encouraged use of IS and flutter valve.  Will follow.       Follow Up Recommendations  Home health OT;Supervision/Assistance - 24 hour    Equipment Recommendations  None recommended by OT    Recommendations for Other Services       Precautions / Restrictions Precautions Precautions: Fall;Sternal Restrictions Weight Bearing Restrictions: No      Mobility Bed Mobility Overal bed mobility: Needs Assistance Bed Mobility: Rolling;Sidelying to Sit Rolling: Min assist Sidelying to sit: Mod assist;+2 for physical assistance       General bed mobility comments: Needed assist for elevation of trunk and for LEs using pad.  Cues for sternal precautions.   Transfers Overall transfer level: Needs assistance Equipment used:  (EVa walker) Transfers: Sit to/from [facility] Sit to Stand: Min assist;From elevated surface Stand pivot transfers: Min assist;+2 safety/equipment       General transfer comment: Pt needed assit to power up and incr cues to follow sternal precautions.     Balance Overall balance assessment: Needs assistance Sitting-balance support: No upper  extremity supported;Feet supported Sitting balance-[name]: Good     Standing balance support: Bilateral upper extremity supported;During functional activity Standing balance-[name]: Poor Standing balance comment: relies on Eva walker for balance and support                           ADL either performed or assessed with clinical judgement   ADL Overall ADL's : Needs assistance/impaired Eating/Feeding: Set up;Sitting   Grooming: Wash/dry hands;Wash/dry face;Oral care;Brushing hair;Set up;Sitting   Upper Body Bathing: Moderate assistance;Sitting   Lower Body Bathing: Maximal assistance;Sit to/from stand   Upper Body Dressing : Moderate assistance;Sitting   Lower Body Dressing: Total assistance;Sit to/from stand   Toilet Transfer: Minimal assistance;+2 for physical assistance;Ambulation;Comfort height toilet;Grab bars;RW   Toileting- Clothing Manipulation and Hygiene: Sit to/from stand;Total assistance       Functional mobility during ADLs: Minimal assistance;+2 for physical assistance;Rolling walker General ADL Comments: Pt fatigues quickly      Vision         Perception     Praxis      Pertinent Vitals/Pain Pain Assessment: Faces Faces Pain Scale: Hurts little more Pain Location: incision Pain Descriptors / Indicators: Aching;Grimacing;Guarding Pain Intervention(s): Limited activity within patient's tolerance;Monitored during session     Hand Dominance     Extremity/Trunk Assessment Upper Extremity Assessment Upper Extremity Assessment: Generalized weakness   Lower Extremity Assessment Lower Extremity Assessment: Defer to PT evaluation   Cervical / Trunk Assessment Cervical / Trunk Assessment: Kyphotic   Communication Communication Communication: No difficulties   Cognition Arousal/Alertness: Awake/alert Behavior During Therapy: Flat affect Overall Cognitive Status: Impaired/Different  from baseline Area of Impairment: Following  commands;Awareness;Problem solving                       Following Commands: Follows one step commands with increased time   Awareness: Intellectual Problem Solving: Slow processing;Decreased initiation;Difficulty sequencing;Requires verbal cues;Requires tactile cues     General Comments  02 sats decreased to 86% on 15L HiFlo 02, but with pursed lip breathing he was able to improve to low 90s to continue activity.   Encouraged IS and flutter valve use     Exercises    Shoulder Instructions      Home Living Family/patient expects to be discharged to:: Private residence Living Arrangements: Spouse/significant other Available Help at Discharge: Family;Available 24 hours/day Type of Home: House Home Access: Stairs to enter [employer] of Steps: 4 +4 Entrance Stairs-Rails: Right;Left;Can reach both Home Layout: One level     Bathroom Shower/Tub: Occupational psychologist: Standard     Home Equipment: Environmental consultant - 4 wheels;Bedside commode;Shower seat - built in          Prior Functioning/Environment Level of Independence: Independent                 OT Problem List: Decreased strength;Decreased activity tolerance;Impaired balance (sitting and/or standing);Decreased cognition;Decreased safety awareness;Decreased knowledge of use of DME or AE;Decreased knowledge of precautions;Cardiopulmonary status limiting activity;Pain      OT Treatment/Interventions: Self-care/ADL training;Energy conservation;DME and/or AE instruction;Therapeutic activities;Visual/perceptual remediation/compensation;Patient/family education;Balance training    OT Goals(Current goals can be found in the care plan section) Acute Rehab OT Goals Patient Stated Goal: to go home  OT Goal Formulation: With patient/family Time For Goal Achievement: 04/14/17 Potential to Achieve Goals: Good ADL Goals Pt Will Perform Grooming: with supervision;standing Pt Will Perform Upper Body  Bathing: with supervision;with set-up;sitting Pt Will Perform Lower Body Bathing: with min guard assist;sit to/from stand Pt Will Perform Upper Body Dressing: with set-up;sitting Pt Will Perform Lower Body Dressing: with min guard assist;sit to/from stand;with adaptive equipment Pt Will Transfer to Toilet: with min guard assist;ambulating;regular height toilet;bedside commode;grab bars Pt Will Perform Toileting - Clothing Manipulation and hygiene: with min guard assist;sit to/from stand  OT Frequency: Min 2X/week   Barriers to D/C:            Co-evaluation PT/OT/SLP Co-Evaluation/Treatment: Yes Reason for Co-Treatment: For patient/therapist safety PT goals addressed during session: Mobility/safety with mobility OT goals addressed during session: ADL's and self-care;Strengthening/ROM      AM-PAC PT "6 Clicks" Daily Activity     Outcome Measure Help from another person eating meals?: A Little Help from another person taking care of personal grooming?: A Little Help from another person toileting, which includes using toliet, bedpan, or urinal?: A Lot Help from another person bathing (including washing, rinsing, drying)?: A Lot Help from another person to put on and taking off regular upper body clothing?: A Lot Help from another person to put on and taking off regular lower body clothing?: Total 6 Click Score: 13   End of Session Equipment Utilized During Treatment: Other (comment) (EVA walker ) Nurse Communication: Mobility status  Activity Tolerance: Patient tolerated treatment well Patient left: in chair;with call bell/phone within reach;with chair alarm set;with family/visitor present  OT Visit Diagnosis: Unsteadiness on feet (R26.81);Cognitive communication deficit (R41.841)                Time: 2778-2423 OT Time Calculation (min): 25 min Charges:  OT General Charges $OT Visit: 1  Procedure OT Evaluation $OT Eval Moderate Complexity: 1 Procedure G-Codes:     Lucille Passy, OTR/L [phone]   Lucille Passy M 03/31/2017, 2:35 PM

## 2017-03-31 NOTE — Progress Notes (Addendum)
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  Bentall procedure.  Aortic root repleacement with bioprosthetic valve.  Reimplantation of left and right coronary arteries.  Total resection of transverse aortic arch.  Elephant trunk distal anastomosis and debranching of arch vessels. (N/A) TRANSESOPHAGEAL ECHOCARDIOGRAM (TEE) (N/A)  Total Length of Stay:  LOS: 7 days   Subjective: Patient receiving breathing treatment this am.  Objective: Vital signs in last 24 hours: Temp:  [98.9 F (37.2 C)-100.2 F (37.9 C)] 98.9 F (37.2 C) (08/22 0400) Pulse Rate:  [63-103] 75 (08/22 0700) Cardiac Rhythm: Normal sinus rhythm (08/22 0400) Resp:  [14-36] 20 (08/22 0700) BP: (107-147)/(63-93) 128/76 (08/22 0700) SpO2:  [87 %-98 %] 98 % (08/22 0700) FiO2 (%):  [89 %] 89 % (08/21 1905) Weight:  [97 kg (213 lb 13.5 oz)] 97 kg (213 lb 13.5 oz) (08/22 0600)  Filed Weights   03/29/17 0700 03/30/17 0500 03/31/17 0600  Weight: 96.7 kg (213 lb 3 oz) 96.5 kg (212 lb 11.9 oz) 97 kg (213 lb 13.5 oz)    Weight change: 0.5 kg (1 lb 1.6 oz)      Intake/Output from previous day: 08/21 0701 - 08/22 0700 In: 6789 [P.O.:765; IV Piggyback:600] Out: 2165 [Urine:2165]  Intake/Output this shift: No intake/output data recorded.  Current Meds: Scheduled Meds: . aspirin EC  325 mg Oral Daily  . bisacodyl  10 mg Oral Daily   Or  . bisacodyl  10 mg Rectal Daily  . Chlorhexidine Gluconate Cloth  6 each Topical Daily  . docusate sodium  200 mg Oral Daily  . enoxaparin (LOVENOX) injection  30 mg Subcutaneous Q24H  . flecainide  100 mg Oral BID  . folic acid-pyridoxine-cyancobalamin  1 tablet Oral Daily  . furosemide  80 mg Intravenous BID  . ipratropium-albuterol  3 mL Nebulization Q6H  . mouth rinse  15 mL Mouth Rinse BID  .  nicotine  14 mg Transdermal Daily  . pantoprazole  40 mg Oral Daily  . sodium chloride flush  10-40 mL Intracatheter Q12H  . sodium chloride flush  3 mL Intravenous Q12H  . sodium chloride flush  3 mL Intravenous Q12H   Continuous Infusions: . sodium chloride 250 mL (03/25/17 0600)  . sodium chloride    . meropenem (MERREM) IV Stopped (03/31/17 3810)   PRN Meds:.sodium chloride, labetalol, ondansetron (ZOFRAN) IV, oxyCODONE, potassium chloride, sodium chloride flush, sodium chloride flush, sodium chloride flush, traMADol  General appearance: alert, cooperative and no distress Heart: RRR Lungs: Coarse breath sounds bilaterally Abdomen: Soft, non tender, bowel sounds present Extremities: Mild LE edema. Swelling right hand-secondary to previous IV, a line Wounds: Clean and dry  Lab Results: CBC:  Recent Labs  03/30/17 0213 03/31/17 0253  WBC 9.3 17.6*  HGB 9.4* 9.1*  HCT 28.7* 28.2*  PLT 168 252   BMET:   Recent Labs  03/30/17 0213 03/31/17 0253  NA 137 137  K 3.7 3.5  CL 99* 99*  CO2 26 26  GLUCOSE 104* 155*  BUN 25* 21*  CREATININE 1.24 1.19  CALCIUM 7.7* 7.7*    CMET: Lab Results  Component Value Date   WBC 17.6 (H)  03/31/2017   HGB 9.1 (L) 03/31/2017   HCT 28.2 (L) 03/31/2017   PLT 252 03/31/2017   GLUCOSE 155 (H) 03/31/2017   CHOL 162 10/05/2016   TRIG 105.0 10/05/2016   HDL 43.80 10/05/2016   LDLDIRECT 137.9 04/24/2013   LDLCALC 97 10/05/2016   ALT 75 (H) 03/31/2017   AST 68 (H) 03/31/2017   NA 137 03/31/2017   K 3.5 03/31/2017   CL 99 (L) 03/31/2017   CREATININE 1.19 03/31/2017   BUN 21 (H) 03/31/2017   CO2 26 03/31/2017   TSH 1.00 03/12/2016   PSA 7.18 (H) 01/10/2016   INR 1.09 03/24/2017   HGBA1C 5.5 10/28/2011      PT/INR: No results for input(s): LABPROT, INR in the last 72 hours. Radiology: Dg Chest Port 1 View  Result Date: 03/31/2017 CLINICAL DATA:  Short of breath EXAM: PORTABLE CHEST 1 VIEW COMPARISON:  Yesterday FINDINGS:  Severe cardiomegaly. Aortic valve replacement hardware is in place. Pulmonary edema improved. It is primarily interstitial. Bibasilar haziness is stable and likely represents bilateral pleural effusions and volume loss. IMPRESSION: Improved pulmonary edema. Electronically Signed   By: Marybelle Killings M.D.   On: 03/31/2017 07:11   Dg Chest Port 1 View  Result Date: 03/30/2017 CLINICAL DATA:  Shortness of breath. History of ascending aortic dissection. EXAM: PORTABLE CHEST 1 VIEW COMPARISON:  Chest radiograph 03/30/2017, chest CT 03/23/2017 FINDINGS: Postsurgical changes with median sternotomy and aortic valve replacement. Cardiomediastinal silhouette is widened. Cardiac silhouette is enlarged. There is no evidence of pneumothorax. There are bilateral pleural effusions, right greater than left. Mixed pattern pulmonary edema. Osseous structures are without acute abnormality. Soft tissues are grossly normal. IMPRESSION: Enlarged cardiomediastinal silhouette. Development of bilateral pleural effusions and mixed pattern pulmonary edema. Electronically Signed   By: Fidela Salisbury M.D.   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  Bentall procedure.  Aortic root repleacement with bioprosthetic valve.  Reimplantation of left and right coronary arteries.  Total resection of transverse aortic arch.  Elephant trunk distal anastomosis and debranching of arch vessels. (N/A) TRANSESOPHAGEAL ECHOCARDIOGRAM (TEE) (N/A)  1. CV-SR in the 70's this am. On Flecainide 100 mg bid 2. Pulmonary-Patient with long history of tobacco abuse and is a mouth breather. Has been on hight flow oxygen. CXR this am shows some improvement in CHF, no pneumothorax, bibasilar atelectasis, small pleural effusions, and cardiomegaly.  Continue Duonebs. Encourage incentive spirometer and flutter valve.  3. Volume overload-on Lasix 80 mg IV bid 4. ABL anemia-H and H  stable at 9.1 and 28.7 2. Continue Foltx. 5. Thrombocytopenia resloved-platelets up to 168,000 6. Hypokalemia-supplement orally again today 7. ID-on Meropenem for empiric coverage. He had a fever to 100.2 last evening and WBC now 17,600. He was given a dose of steroids last evening which is likely etiology of elevated WBC. Sputum culture pending-re incubated. Gram stain sputum few gram pos rods, rare yeast and gram positive cocci. 8. Continue PT/OT 9. Will transfer once pulmonary status more stable  ZIMMERMAN,DONIELLE M PA-C 03/31/2017 7:48 AM   I have seen and examined the patient and agree with the assessment and plan as outlined.  WBC up today but received large dose IV solumedrol last night due to wheezing.  Still on empiric ABx.  Continue diuresis, pulm toilet and nebs.  Check CTA routine f/u aortic dissection.  Transfer stepdown.  Rexene Alberts, MD 03/31/2017 8:13 AM

## 2017-04-01 ENCOUNTER — Inpatient Hospital Stay (HOSPITAL_COMMUNITY): Payer: Medicare Other

## 2017-04-01 LAB — BASIC METABOLIC PANEL
ANION GAP: 10 (ref 5–15)
BUN: 27 mg/dL — ABNORMAL HIGH (ref 6–20)
CO2: 26 mmol/L (ref 22–32)
Calcium: 7.6 mg/dL — ABNORMAL LOW (ref 8.9–10.3)
Chloride: 98 mmol/L — ABNORMAL LOW (ref 101–111)
Creatinine, Ser: 1.1 mg/dL (ref 0.61–1.24)
GFR calc Af Amer: >60 mL/min (ref >60)
Glucose, Bld: 105 mg/dL — ABNORMAL HIGH (ref 65–99)
POTASSIUM: 3.7 mmol/L (ref 3.5–5.1)
SODIUM: 134 mmol/L — AB (ref 135–145)

## 2017-04-01 LAB — CBC
HEMATOCRIT: 26.8 % — AB (ref 39.0–52.0)
HEMOGLOBIN: 8.6 g/dL — AB (ref 13.0–17.0)
MCH: 29 pg (ref 26.0–34.0)
MCHC: 32.1 g/dL (ref 30.0–36.0)
MCV: 90.2 fL (ref 78.0–100.0)
Platelets: 243 10*3/uL (ref 150–400)
RBC: 2.97 MIL/uL — ABNORMAL LOW (ref 4.22–5.81)
RDW: 15.8 % — ABNORMAL HIGH (ref 11.5–15.5)
WBC: 18.5 10*3/uL — AB (ref 4.0–10.5)

## 2017-04-01 MED ORDER — METOLAZONE 5 MG PO TABS
5.0000 mg | ORAL_TABLET | Freq: Once | ORAL | Status: AC
  Administered 2017-04-01: 5 mg via ORAL
  Filled 2017-04-01: qty 1

## 2017-04-01 NOTE — Care Management Note (Signed)
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  Bentall procedure 8/15                 Action/Plan: PTA pt lived at home with wife-independent,  CM to follow for d/c needs  Expected Discharge Date:                  Expected Discharge Plan:  [place]  In-House Referral:     Discharge planning Services  CM Consult  Post Acute Care Choice:    Choice offered to:     DME Arranged:    DME Agency:     HH Arranged:    HH Agency:     Status of Service:  In process, will continue to follow  If discussed at Long Length of Stay Meetings, dates discussed:  8/23  Discharge Disposition:   Additional Comments:  04/01/17- 1525- Marvetta Gibbons RN, CM- pt continues to need HF 02- vol. Overload- recommendation for [facility]- if progresses to going home will need [place] orders for discharge-   [name], RN 04/01/2017, 3:25 PM

## 2017-04-01 NOTE — Progress Notes (Addendum)
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  Bentall procedure.  Aortic root repleacement with bioprosthetic valve.  Reimplantation of left and right coronary arteries.  Total resection of transverse aortic arch.  Elephant trunk distal anastomosis and debranching of arch vessels. (N/A) TRANSESOPHAGEAL ECHOCARDIOGRAM (TEE) (N/A)   Subjective:  Patient states he justs want to get feeling better.  His daughter is at bedside.  All questions answered and addressed.  Events of yesterday noted, with continued desaturations despite oxygen.Marland Kitchen He was placed on high flow heated oxygen therapy at 30 overnight.   Objective: Vital signs in last 24 hours: Temp:  [97.4 F (36.3 C)-98.6 F (37 C)] 98.6 F (37 C) (08/23 0346) Pulse Rate:  [46-78] 46 (08/23 0346) Cardiac Rhythm: Normal sinus rhythm (08/22 2114) Resp:  [16-25] 22 (08/23 0346) BP: (104-139)/(56-82) 119/56 (08/23 0346) SpO2:  [88 %-99 %] 96 % (08/23 0740) FiO2 (%):  [80 %-100 %] 100 % (08/23 0740) Weight:  [205 lb 14.4 oz (93.4 kg)] 205 lb 14.4 oz (93.4 kg) (08/23 0346)  Intake/Output from previous day: 08/22 0701 - 08/23 0700 In: 480 [P.O.:480] Out: 2125 [Urine:2125]  General appearance: cooperative and no distress Heart: regular rate and rhythm Lungs: diminished breath sounds bilaterally and wheezes bilaterally Abdomen: soft, non-tender; bowel sounds normal; no masses,  no organomegaly Extremities: edema pitting 2-3 + Wound: clean and dry  Lab Results:  Recent Labs  03/31/17 0253 04/01/17 0312  WBC 17.6* 18.5*  HGB 9.1* 8.6*  HCT 28.2* 26.8*  PLT 252 243   BMET:  Recent Labs  03/31/17 0253 04/01/17 0312  NA 137 134*  K 3.5 3.7  CL 99* 98*  CO2 26 26  GLUCOSE 155* 105*  BUN 21* 27*  CREATININE 1.19 1.10  CALCIUM 7.7* 7.6*    PT/INR: No results for input(s): LABPROT, INR in  the last 72 hours. ABG    Component Value Date/Time   PHART 7.522 (H) 03/31/2017 0500   HCO3 26.8 03/31/2017 0500   TCO2 29 03/25/2017 1627   ACIDBASEDEF 2.0 03/25/2017 0742   O2SAT 92.7 03/31/2017 0500   CBG (last 3)  No results for input(s): GLUCAP in the last 72 hours.  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  Bentall procedure.  Aortic root repleacement with bioprosthetic valve.  Reimplantation of left and right coronary arteries.  Total resection of transverse aortic arch.  Elephant trunk distal anastomosis and debranching of arch vessels. (N/A) TRANSESOPHAGEAL ECHOCARDIOGRAM (TEE) (N/A)  1. CV- NSR, BP controlled- continue Flecainide and labetalol 2. Pulm- no evidence of respiratory distress, despite sats in the 80s and requirement of increased oxygen therapy... CT obtained, yesterday without evidence of PE, small effusions noted... There is a moderate sized pericardial effusion noted, which could be attributing to respiratory issues 3. Renal- creatinine trending down, weight is trending down, continue IV Lasix 80 mg BID, will give a dose of Metolazone this morning, as CHF changes on CXR, K at 3.7 will supplement again today 4. Expected post operative anemia- hgb down to 8.6 this morning, continue iron supplementation 5. ID- leukocytosis.. Likely related to dose of steroids, remains afebrile, on empiric ABX 6. Continue PT/OT 7. Dispo- respiratory status limiting progress-  will aggressively diurese today, supplement K.. Patient is  stable continue current care    LOS: 8 days    BARRETT, ERIN 04/01/2017  I have seen and examined the patient and agree with the assessment and plan as outlined.  CTA chest/abd/pelvis with expected appearance of aorta with widely patent arch vessels and persistent dissection of descending thoracic and abdominal aorta.  There is no sign of malperfusion or dilatation of the descending aorta.  The  patient does have a moderate sized pericardial effusion primarily posteriorly, which is not unexpected.  Will get ECHO to assess significance.  Patient also has small bilateral pleural effusions and bibasilar atelectasis.  Continue diuresis and mobilize.  Rexene Alberts, MD 04/01/2017 10:53 AM

## 2017-04-01 NOTE — Consult Note (Signed)
[facility] Primary Care Navigator  04/01/2017  Sean Riley 1951-08-10 [id number]   Met with patient and wife Sean Riley) at the bedside to identify possible discharge needs.  Wife reports that patient had complained of sudden, extreme chest pain;  shortness of breath and "almost passed out" that had led to this admission/ surgery. Patientendorses Dr. Kathlene November with [facility] as his primary care provider.   Patient's wife shared using [facility] service and [employer] in [place] obtain medications without difficulty.   Patientreportsmanaging his own medicationsat home straight out of the containers. Wife mentioned the plan of using "pill box" system for his medications after discharge.  Patient was driving prior to admission/ surgery. Wife will be providing transportation to hisdoctors'appointments after discharge.  His wife will be theprimary caregiver at home but children will be around to help with needs at home as stated.  Anticipated plan for discharge is home with home health services per therapy recommendation.  Patientand wife voiced understanding to call his primary care provider's office when he returns home to schedulea post discharge follow-upappointment within a week or sooner if needs arise. Patient letter (with PCP's contact number) was provided as their reminder.  Explained to patient and wife regarding[facility] CM services available for health management but bothdenied any current needs or concerns at this time.  Patient declined EMMI Callsforfollow-up with his recovery but expressedunderstandingto seek a referral from primary care provider to [facility] care management if deemed necessaryand appropriate for services in the future (on his follow-up visit).  [facility] care management information provided for future needs that may arise.   For questions, please contact:  Dannielle Huh, BSN, RN-  [facility] Primary Care Navigator  Telephone: [phone] [place]

## 2017-04-01 NOTE — Care Management Important Message (Signed)
Important Message  Patient Details  Name: Sean Riley MRN: [id number] Date of Birth: [birth year]   Medicare Important Message Given:  Yes    Sean Riley Montine Circle 04/01/2017, 11:08 AM

## 2017-04-02 ENCOUNTER — Inpatient Hospital Stay (HOSPITAL_COMMUNITY): Payer: Medicare Other

## 2017-04-02 ENCOUNTER — Other Ambulatory Visit (HOSPITAL_COMMUNITY): Payer: Medicare Other

## 2017-04-02 DIAGNOSIS — I319 Disease of pericardium, unspecified: Secondary | ICD-10-CM

## 2017-04-02 LAB — BASIC METABOLIC PANEL
ANION GAP: 9 (ref 5–15)
BUN: 30 mg/dL — ABNORMAL HIGH (ref 6–20)
CHLORIDE: 95 mmol/L — AB (ref 101–111)
CO2: 32 mmol/L (ref 22–32)
CREATININE: 1.21 mg/dL (ref 0.61–1.24)
Calcium: 7.9 mg/dL — ABNORMAL LOW (ref 8.9–10.3)
GFR calc non Af Amer: >60 mL/min (ref >60)
Glucose, Bld: 142 mg/dL — ABNORMAL HIGH (ref 65–99)
Potassium: 3 mmol/L — ABNORMAL LOW (ref 3.5–5.1)
SODIUM: 136 mmol/L (ref 135–145)

## 2017-04-02 LAB — CBC
HCT: 28.1 % — ABNORMAL LOW (ref 39.0–52.0)
HEMOGLOBIN: 9 g/dL — AB (ref 13.0–17.0)
MCH: 28.8 pg (ref 26.0–34.0)
MCHC: 32 g/dL (ref 30.0–36.0)
MCV: 89.8 fL (ref 78.0–100.0)
PLATELETS: 257 10*3/uL (ref 150–400)
RBC: 3.13 MIL/uL — AB (ref 4.22–5.81)
RDW: 15.5 % (ref 11.5–15.5)
WBC: 17.1 10*3/uL — AB (ref 4.0–10.5)

## 2017-04-02 LAB — COOXEMETRY PANEL
CARBOXYHEMOGLOBIN: 0.8 % (ref 0.5–1.5)
METHEMOGLOBIN: 1.2 % (ref 0.0–1.5)
O2 Saturation: 47.9 %
TOTAL HEMOGLOBIN: 12 g/dL (ref 12.0–16.0)

## 2017-04-02 LAB — ECHOCARDIOGRAM COMPLETE
Height: 73 in
WEIGHTICAEL: 3288 [oz_av]

## 2017-04-02 MED ORDER — METOLAZONE 5 MG PO TABS
10.0000 mg | ORAL_TABLET | Freq: Once | ORAL | Status: AC
  Administered 2017-04-02: 10 mg via ORAL
  Filled 2017-04-02: qty 2

## 2017-04-02 MED ORDER — SODIUM CHLORIDE 0.9% FLUSH
10.0000 mL | INTRAVENOUS | Status: DC | PRN
  Administered 2017-04-02: 10 mL
  Administered 2017-04-06: 20 mL
  Filled 2017-04-02 (×2): qty 40

## 2017-04-02 MED ORDER — POTASSIUM CHLORIDE CRYS ER 20 MEQ PO TBCR
30.0000 meq | EXTENDED_RELEASE_TABLET | Freq: Once | ORAL | Status: AC
  Administered 2017-04-02: 09:00:00 30 meq via ORAL
  Filled 2017-04-02: qty 1

## 2017-04-02 MED ORDER — FUROSEMIDE 10 MG/ML IJ SOLN
80.0000 mg | Freq: Three times a day (TID) | INTRAMUSCULAR | Status: DC
  Administered 2017-04-02 – 2017-04-04 (×6): 80 mg via INTRAVENOUS
  Filled 2017-04-02 (×6): qty 8

## 2017-04-02 MED ORDER — POTASSIUM CHLORIDE CRYS ER 20 MEQ PO TBCR
40.0000 meq | EXTENDED_RELEASE_TABLET | Freq: Once | ORAL | Status: AC
Start: 2017-04-02 — End: 2017-04-02
  Administered 2017-04-02: 40 meq via ORAL
  Filled 2017-04-02: qty 2

## 2017-04-02 NOTE — Discharge Summary (Signed)
Physician Discharge Summary  Patient ID: Sean Riley MRN: [id number] DOB/AGE: Aug 30, 1950 66 y.o.  Admit date: 03/23/2017 Discharge date: 04/09/2017  Admission Diagnoses: Acute Type A Aortic Dissection  Active Diagnoses:    Diagnosis Date Noted  . S/P aortic dissection repair 03/24/2017  . 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 Ascending aortic dissection ([place]) 03/23/2017  . Elevated PSA 01/11/2016  . Medication side effect 01/10/2016  . Screening for prostate cancer 01/10/2016  . Gynecomastia, male 10/10/2014  . Allergic rhinitis 09/10/2014  . Dermatitis 05/11/2014  . Abnormal CXR 05/10/2014  . Erectile dysfunction 05/10/2014  . HTN (hypertension) 10/28/2013  . Bronchospasm 10/28/2013  . ETOH abuse 10/28/2013  . Varicose veins of lower extremities with ulcer ([place]) 09/15/2011  . RBBB 11/14/2008  . Hyperlipidemia 03/09/2008  . tobacco use d/o 03/09/2008  . Atrial fibrillation ([place]) 03/09/2008  . GERD 03/09/2008  . Microscopic hematuria 03/09/2008  . 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 He initially had shortness of breath but this has improved. He denies any abdominal pain. He denies any visual disturbances or numbness and/or weakness involving either upper or lower extremity. Blood pressure has been moderately elevated or normal in the emergency department but stable. CT angiogram of the chest, abdomen, and pelvis demonstrates acute type A aortic dissection. Cardiothoracic surgical consultation was requested.    Patient [place] his [place] locally [place].  He has been retired from [employer] business for several years but remains physically active, working his forearm. He reports no significant physical limitations up until recently. He states that over the past week or 2 he has felt fatigued. He has never had any chest pain or chest tightness prior to that which developed acutely dissected afternoon. He denies any problems with exertional shortness of breath, PND, orthopnea, or lower extremity edema. He smokes 2 packs of cigarettes daily but denies any known history of COPD or emphysema. He has known history of hypertension but admits that he does not take his blood pressure medications regularly. He takes Nexium and flecainide on a daily basis. He does not take any other medications regularly.  Discharged Condition: good  Hospital Course :  the patient was taken promptly to the operating room where he underwent the below described procedure by Dr. Roxy Manns. He tolerated the procedure well and was taken to the surgical intensive care unit in stable condition. There were no intraoperative complications.  Postoperative hospital course:  The patient has remained remarkably hemodynamically stable and pressors were discontinued in short order except dyspnea and low-dose dopamine the ventilator was weaned in a slow manner so as to assess neuro status over time. He was extubated without difficulty on 03/25/2017. He has had a fairly difficult course from a pulmonary status during the early postoperative period due to his heavy tobacco abuse and COPD history. He require high flow oxygen for some time as well as multiple nebulizers but has shown a steady improvement. He does have an expected acute blood loss anemia and values have stabilized. He has expected postoperative volume excess and has responded well to diuretics. He did develop some prerenal azotemia +/-  acute kidney injury caused by ATN. That these have normalized over time and most  recent creatinine is 1.08. He did have a postoperative thrombocytopenia which has resolved. The patient did develop some sputum production and was treated prophylactically with intravenous antibiotics initially Fortaz, then meropenem. The patient has a remote history of atrial fibrillation and was placed on his preoperative 9 dose but did have recurrent atrial fibrillation postoperatively. He was placed additionally on Cardizem but did not tolerate this secondary to itching. He is now in sinus rhythm. He did develop a moderate-sized pericardial effusion evaluated by CTA and echocardiogram. Follow up echo done 04/07/2017 showed LFVE 40-45% and moderate to large pericardial effusion (no tamponade) . He underwent a pericardiocentesis on 04/07/2017.  Approximately 650 cc of sero sanguinous pericardial fluid was removed. He is tolerating gradually increasing activities with physical and occupational therapy modalities in addition to cardiac rehabilitation. Incisions are noted to be healing well without evidence of infection. He is tolerating diet. He was weaned from high flow oxygen, to nasal cannula, and now to room air. He has been tolerating a diet and has had multiple bowel movements. He has had intermittent periods of confusion, which was felt related to surgery and/or prolonged hospitalization. It usually occurs at night into early morning. As discussed with Dr. Roxy Manns, will ask wife to weigh patient everyday. If his weight increases or he gets LE edema, he is to take Lasix;otherwise, he will take Lasix PRN.I I have arranged a follow up appointment with Dr. Caryl Comes, who he has seen in the past.  He is felt surgically stable today.  Consults: None  Significant Diagnostic Studies: angiography: CTA chest x 2, echocardiogram  Treatments: surgery:  CARDIOTHORACIC SURGERY OPERATIVE NOTE  Date of Procedure:                03/24/2017  Preoperative Diagnosis:      Acute Type A Aortic Dissection   Postoperative  Diagnosis:    Same   Procedure:        Emergency Repair of Aortic Dissection              Right axillary artery cannulation             Deep hypothermia and total circulatory arrest with continuous antegrade cerebral perfusion             Resection and grafting of ascending aorta, transverse aortic arch and proximal descending thoracic aorta             Elephant trunk distal anastomosis             Debranching of aortic arch vessles               Biological Bentall Aortic Root Replacement             Edwards Magna Ease Pericardial Tissue Valve (size 25 mm, model # 3300TFX, serial # [id number])             [place] Valsalva Aortic Root Graft (size 28 mm, ref # [id number], serial # [id number])             Reimplantation of Left Main and Right Coronary Arteries              Surgeon:        Valentina Gu. Roxy Manns, MD  Assistant:       Nani Skillern, PA-C  Anesthesia:    Midge Minium, MD  Operative Findings: ? Acute type A aortic dissection ? Proximal  tear in aortic root extending to the aortic annulus  ? Reentry tear in distal aortic arch between the left common carotid artery and the left subclavian artery ? Normal LV systolic function  Discharge Exam: Blood pressure 104/69, pulse 88, temperature 99.3 F (37.4 C), temperature source Oral, resp. rate (!) 26, height 6\' 1"  (1.854 m), weight 171 lb 9.6 oz (77.8 kg), SpO2 98 %.  Heart: RRR Lungs: Slightly diminished at bases Abdomen: Soft, non tender, bowel sounds present Extremities: Mild LE edema.  Wounds: Clean and dry   Disposition: Stable and discharged to home.   Allergies as of 04/09/2017      Reactions   Simvastatin Other (See Comments)   Mental status changes   Celecoxib Other (See Comments)   GI UPSET AND INFLAMMATION   Codeine Other (See Comments)   GI UPSET AND INFLAMMATION   Nsaids Other (See Comments)   GI UPSET AND INFLAMMATION (can tolerate via IV)   Tape Other (See Comments)   Medical tape  and Band-Aids PULL OFF THE SKIN; please use Coban wrap   Cephalexin Itching, Rash   Latex Rash      Medication List    STOP taking these medications   amLODipine 5 MG tablet Commonly known as:  NORVASC   sildenafil 100 MG tablet Commonly known as:  VIAGRA     TAKE these medications   albuterol 108 (90 Base) MCG/ACT inhaler Commonly known as:  PROAIR HFA Inhale two puffs every four to six hours as needed for cough or wheeze. What changed:  how much to take  how to take this  when to take this  additional instructions   aspirin 325 MG EC tablet Take 1 tablet (325 mg total) by mouth daily.   azelastine 0.1 % nasal spray Commonly known as:  ASTELIN Can use two sprays in each nostril twice daily as directed. What changed:  how much to take  how to take this  when to take this  additional instructions   betamethasone dipropionate 0.05 % ointment Commonly known as:  DIPROLENE Apply 1 application topically at bedtime. APPLY TO BACK AREA   calcipotriene-betamethasone ointment Commonly known as:  TACLONEX Apply 1 application topically daily as needed (for eczema).   esomeprazole 40 MG capsule Commonly known as:  NEXIUM Take 1 capsule (40 mg total) by mouth daily before breakfast.   ferrous sulfate 325 (65 FE) MG tablet Take 1 tablet (325 mg total) by mouth daily. For one month then stop.   flecainide 100 MG tablet Commonly known as:  TAMBOCOR Take 1 tablet (100 mg total) by mouth 2 (two) times daily.   fluticasone 50 MCG/ACT nasal spray Commonly known as:  FLONASE Use one spray in each nostril twice daily.   furosemide 40 MG tablet Commonly known as:  LASIX Take 1 tablet (40 mg total) by mouth daily as needed.   ipratropium 0.06 % nasal spray Commonly known as:  ATROVENT Can use two sprays in each nostril every six hours as needed to dry up nose. What changed:  how much to take  how to take this  when to take this  reasons to take  this  additional instructions   metoprolol tartrate 25 MG tablet Commonly known as:  LOPRESSOR Take 0.5 tablets (12.5 mg total) by mouth 2 (two) times daily.   nicotine 14 mg/24hr patch Commonly known as:  NICODERM CQ - dosed in mg/24 hours Place 1 patch (14 mg total) onto the skin daily.  potassium chloride SA 20 MEQ tablet Commonly known as:  K-DUR,KLOR-CON Take 1 tablet (20 mEq total) by mouth daily as needed.   rosuvastatin 5 MG tablet Commonly known as:  CRESTOR Take 5 mg by mouth every other day.   traMADol 50 MG tablet Commonly known as:  ULTRAM Take 50 mg by mouth every 4-6 hours PRN severe pain.   triamcinolone cream 0.1 % Commonly known as:  KENALOG Apply 1 application topically See admin instructions. To affected site daily as needed for irritation            Durable Medical Equipment        Start     Ordered   04/06/17 1048  For home use only DME Walker rolling  Once    Comments:  Will need tall size pt is 6 foot 1  Question:  Patient needs a walker to treat with the following condition  Answer:  Weakness   04/06/17 1048       Discharge Care Instructions        Start     Ordered   04/09/17 0000  aspirin EC 325 MG EC tablet  Daily     04/09/17 0741   04/09/17 0000  nicotine (NICODERM CQ - DOSED IN MG/24 HOURS) 14 mg/24hr patch  Daily     04/09/17 0741   04/09/17 0000  traMADol (ULTRAM) 50 MG tablet    Question:  Supervising Provider  Answer:  Rexene Alberts   04/09/17 0741   04/09/17 0000  metoprolol tartrate (LOPRESSOR) 25 MG tablet  2 times daily    Question:  Supervising Provider  Answer:  Rexene Alberts   04/09/17 0741   04/09/17 0000  ferrous sulfate 325 (65 FE) MG tablet  Daily    Question:  Supervising Provider  Answer:  Rexene Alberts   04/09/17 0741   04/09/17 0000  furosemide (LASIX) 40 MG tablet  Daily PRN    Question:  Supervising Provider  Answer:  Rexene Alberts   04/09/17 0930   04/09/17 0000  potassium chloride SA  (K-DUR,KLOR-CON) 20 MEQ tablet  Daily PRN    Question:  Supervising Provider  Answer:  Rexene Alberts   04/09/17 0930     Follow-up Information    Rexene Alberts, MD Follow up.   Specialty:  Cardiothoracic Surgery Why:  Appointment to see Dr. Roxy Manns on 05/10/2017 at 1 PM. Please obtain a chest x-ray at [place] at 12:30 PM. [facility] imaging is located in the same office complex. Contact information: [place] [address] [phone]        [facility] Follow up.   Specialty:  [place] Why:  HHRN/PT/OT- arranged- they will call you to set up home visits Contact information: [address] [place] [address] [phone]        [facility] - Dme Follow up.   Why:  RW arranged- to be delivered to room prior to discharge. Contact information: [place] [address] [phone]        Baldwin Jamaica, PA-C Follow up on 04/14/2017.   Specialty:  Cardiology Why:  Appointment time is at 10:30 am Contact information: [address] [place] [address] [phone]           Signed: Nani Skillern PA-C 04/09/2017, 9:39 AM

## 2017-04-02 NOTE — Progress Notes (Signed)
  Echocardiogram 2D Echocardiogram has been performed.  Tresa Res 04/02/2017, 9:09 AM

## 2017-04-02 NOTE — Progress Notes (Addendum)
[address]            [place] [address]          [phone]   10 Days Post-Op Procedure(s) (LRB): REPAIR OF ACUTE ASCENDING THORACIC AORTIC DISSECTION.  Bentall procedure.  Aortic root repleacement with bioprosthetic valve.  Reimplantation of left and right coronary arteries.  Total resection of transverse aortic arch.  Elephant trunk distal anastomosis and debranching of arch vessels. (N/A) TRANSESOPHAGEAL ECHOCARDIOGRAM (TEE) (N/A)  Total Length of Stay:  LOS: 9 days   Subjective: Patient eating breakfast this am. He has no complaints and he is in good spirits.  Objective: Vital signs in last 24 hours: Temp:  [98.3 F (36.8 C)-98.9 F (37.2 C)] 98.8 F (37.1 C) (08/24 0444) Pulse Rate:  [66-79] 67 (08/24 0715) Cardiac Rhythm: Normal sinus rhythm;Bundle branch block (08/23 1900) Resp:  [16-25] 20 (08/24 0715) BP: (90-142)/(54-79) 113/69 (08/24 0444) SpO2:  [88 %-100 %] 96 % (08/24 0715) FiO2 (%):  [80 %-100 %] 80 % (08/24 0715) Weight:  [205 lb 8 oz (93.2 kg)] 205 lb 8 oz (93.2 kg) (08/24 0444)  Filed Weights   03/31/17 0600 04/01/17 0346 04/02/17 0444  Weight: 213 lb 13.5 oz (97 kg) 205 lb 14.4 oz (93.4 kg) 205 lb 8 oz (93.2 kg)   Weight change: -6.4 oz (-0.181 kg)      Intake/Output from previous day: 08/23 0701 - 08/24 0700 In: 486 [P.O.:480; I.V.:6] Out: 1400 [Urine:1400]  Intake/Output this shift: No intake/output data recorded.   General appearance: alert, cooperative and no distress Heart: RRR Lungs: Slightly diminished at bases Abdomen: Soft, non tender, bowel sounds present Extremities: Mild LE edema. Swelling right hand-secondary to previous  a line Wounds: Clean and dry  Lab Results: CBC:  Recent Labs  03/31/17 0253 04/01/17 0312  WBC 17.6* 18.5*  HGB 9.1* 8.6*  HCT 28.2* 26.8*  PLT 252 243   BMET:   Recent Labs  03/31/17 0253 04/01/17 0312  NA 137 134*  K 3.5 3.7  CL 99* 98*  CO2 26 26    GLUCOSE 155* 105*  BUN 21* 27*  CREATININE 1.19 1.10  CALCIUM 7.7* 7.6*    CMET: Lab Results  Component Value Date   WBC 18.5 (H) 04/01/2017   HGB 8.6 (L) 04/01/2017   HCT 26.8 (L) 04/01/2017   PLT 243 04/01/2017   GLUCOSE 105 (H) 04/01/2017   CHOL 162 10/05/2016   TRIG 105.0 10/05/2016   HDL 43.80 10/05/2016   LDLDIRECT 137.9 04/24/2013   LDLCALC 97 10/05/2016   ALT 75 (H) 03/31/2017   AST 68 (H) 03/31/2017   NA 134 (L) 04/01/2017   K 3.7 04/01/2017   CL 98 (L) 04/01/2017   CREATININE 1.10 04/01/2017   BUN 27 (H) 04/01/2017   CO2 26 04/01/2017   TSH 1.00 03/12/2016   PSA 7.18 (H) 01/10/2016   INR 1.09 03/24/2017   HGBA1C 5.5 10/28/2011    PT/INR: No results for input(s): LABPROT, INR in the last 72 hours. Radiology: Dg Chest 2 View  Result Date: 04/01/2017 CLINICAL DATA:  Shortness of breath, CHF EXAM: CHEST  2 VIEW COMPARISON:  03/31/2017 FINDINGS: Prior median sternotomy. Cardiomegaly. Bilateral airspace opacities are noted with layering bilateral effusions. Airspace disease increased since prior study, likely worsening CHF. IMPRESSION: Worsening bilateral airspace disease, likely CHF. Layering bilateral effusions. Electronically Signed   By:  Rolm Baptise M.D.   On: 04/01/2017 08:06    Assessment/Plan: S/P Procedure(s) (LRB): REPAIR OF ACUTE ASCENDING THORACIC AORTIC DISSECTION.  Bentall procedure.  Aortic root repleacement with bioprosthetic valve.  Reimplantation of left and right coronary arteries.  Total resection of transverse aortic arch.  Elephant trunk distal anastomosis and debranching of arch vessels. (N/A) TRANSESOPHAGEAL ECHOCARDIOGRAM (TEE) (N/A)  1. CV-SR in the 70's this am. On Flecainide 100 mg bid and Labetalol 200 mg bid. Await echo results to further assess pericardial effusion found on CTA. 2. Pulmonary-Patient with long history of tobacco abuse and is a mouth breather. Has been on hight flow oxygen. Continue Duonebs. Encourage incentive spirometer  and flutter valve.  3. Volume overload-on Lasix 80 mg IV tid. Will give Metolazone 10 mg this am for one dose. 4. ABL anemia-H and H decreased to 8.6 and 26.8. Continue Foltx. 6. Hypokalemia-supplement orally again today 7. ID-on Meropenem for empiric coverage. No further fever and WBC with slight increase to 18,500. Given steroid 08/22. Sputum culture pending-re incubated. Gram stain sputum few gram pos rods, rare yeast and gram positive cocci. 8. Continue PT/OT 9. Mild hyponatremia-sodium 134. Related to diuresis  ZIMMERMAN,DONIELLE M PA-C 04/02/2017 7:57 AM   I have seen and examined the patient and agree with the assessment and plan as outlined.  ECHO ordered yesterday still hasn't been done - will reorder to evaluate size and functional significance of pericardial effusion.  Increase lasix dose and extra dose Metolazone this morning.  Hold labetalol due to marginal BP.  Rexene Alberts, MD 04/02/2017 8:39 AM

## 2017-04-02 NOTE — Progress Notes (Signed)
Pt and family educated as to goal O2 saturation of 88-89% per Donielle. RT updated. Pt weaned from HFNC 30L 80% down to HFNC 30L 30% over the shift. RT updated - they will update night shift RT to attempt to continue wean.  Fritz Pickerel, RN

## 2017-04-02 NOTE — Progress Notes (Signed)
Peripherally Inserted Central Catheter/Midline Placement  The IV Nurse has discussed with the patient and/or persons authorized to consent for the patient, the purpose of this procedure and the potential benefits and risks involved with this procedure.  The benefits include less needle sticks, lab draws from the catheter, and the patient may be discharged home with the catheter. Risks include, but not limited to, infection, bleeding, blood clot (thrombus formation), and puncture of an artery; nerve damage and irregular heartbeat and possibility to perform a PICC exchange if needed/ordered by physician.  Alternatives to this procedure were also discussed.  Bard Power PICC patient education guide, fact sheet on infection prevention and patient information card has been provided to patient /or left at bedside.    PICC/Midline Placement Documentation    Consent signed by wife at bedside    Synthia Innocent 04/02/2017, 12:46 PM

## 2017-04-02 NOTE — Progress Notes (Signed)
Physical Therapy Treatment Patient Details Name: Sean Riley MRN: [id number] DOB: [birth year] Today's Date: 04/02/2017    History of Present Illness REPAIR OF ACUTE ASCENDING THORACIC AORTIC DISSECTION.  Bentall procedure.  Aortic root repleacement with bioprosthetic valve.  Reimplantation of left and right coronary arteries.  Total resection of transverse aortic arch.      PT Comments    Pt therapy session limited to connection to wall mounted HiFlo Oxygen. Pt currently, minA for bed mobility, transfers and ambulation of 10 feet with RW and close chair follow. Pt requires skilled PT to progress gait training and to improve strength and endurance to safely mobilize in his home environment at discharge.    Follow Up Recommendations  Home health PT;Supervision/Assistance - 24 hour     Equipment Recommendations  None recommended by PT       Precautions / Restrictions Precautions Precautions: Fall;Sternal Restrictions Weight Bearing Restrictions: No    Mobility  Bed Mobility Overal bed mobility: Needs Assistance Bed Mobility: Rolling;Sidelying to Sit Rolling: Min assist Sidelying to sit: Min assist       General bed mobility comments: minA for bring trunk to upright, pt able to slide LE off bed to floor  Transfers Overall transfer level: Needs assistance Equipment used: Rolling walker (2 wheeled) Transfers: Sit to/from Stand Sit to Stand: Min assist;From elevated surface         General transfer comment: Pt needed assit to power up and incr cues to follow sternal precautions.   Ambulation/Gait Ambulation/Gait assistance: Min assist;+2 safety/equipment Ambulation Distance (Feet): 10 Feet Assistive device: Rolling walker (2 wheeled) Gait Pattern/deviations: Decreased stride length;Decreased step length - right;Decreased step length - left;Shuffle;Drifts right/left;Trunk flexed;Wide base of support Gait velocity: slowed Gait velocity interpretation: Below normal  speed for age/gender General Gait Details: Limited by wall attached high flow oxygen but able to walk 10 feet towards door, vc for RW use and upright posture        Balance Overall balance assessment: Needs assistance Sitting-balance support: No upper extremity supported;Feet supported Sitting balance-[name]: Good     Standing balance support: During functional activity Standing balance-[name]: Fair                              Cognition Arousal/Alertness: Awake/alert Behavior During Therapy: Flat affect Overall Cognitive Status: Impaired/Different from baseline Area of Impairment: Following commands;Awareness;Problem solving                       Following Commands: Follows multi-step commands with increased time;Follows multi-step commands consistently   Awareness: Emergent Problem Solving: Slow processing;Requires verbal cues        Exercises General Exercises - Lower Extremity Ankle Circles/Pumps: AROM;Both;5 reps;Supine Long Arc Quad: AROM;Both;Seated;5 reps Hip Flexion/Marching: AROM;Other reps (comment);Both;Standing (marching in place for 2 minutes)    General Comments General comments (skin integrity, edema, etc.): Pt on 30 L HiFlo O2 via nasal cannula and maintained SaO2 > 90% throughout session, encouraged IS and flutter valve use. remaining VSS       Pertinent Vitals/Pain Pain Assessment: Faces Faces Pain Scale: Hurts a little bit Pain Location: incision Pain Descriptors / Indicators: Aching;Grimacing;Guarding Pain Intervention(s): Monitored during session;Limited activity within patient's tolerance           PT Goals (current goals can now be found in the care plan section) Acute Rehab PT Goals PT Goal Formulation: With patient Time For Goal Achievement: 04/12/17  Potential to Achieve Goals: Good Progress towards PT goals: PT to reassess next treatment    Frequency    Min 3X/week      PT Plan Current plan  remains appropriate    Co-evaluation PT/OT/SLP Co-Evaluation/Treatment: Yes            AM-PAC PT "6 Clicks" Daily Activity  Outcome Measure  Difficulty turning over in bed (including adjusting bedclothes, sheets and blankets)?: Unable Difficulty moving from lying on back to sitting on the side of the bed? : A Lot Difficulty sitting down on and standing up from a chair with arms (e.g., wheelchair, bedside commode, etc,.)?: A Little Help needed moving to and from a bed to chair (including a wheelchair)?: A Little Help needed walking in hospital room?: A Lot Help needed climbing 3-5 steps with a railing? : Total 6 Click Score: 12    End of Session Equipment Utilized During Treatment: Gait belt;Oxygen Activity Tolerance: Patient limited by fatigue Patient left: in chair;with call bell/phone within reach;with chair alarm set;with family/visitor present Nurse Communication: Mobility status PT Visit Diagnosis: Unsteadiness on feet (R26.81);Muscle weakness (generalized) (M62.81)     Time: 6067-7034 PT Time Calculation (min) (ACUTE ONLY): 25 min  Charges:  $Therapeutic Exercise: 23-37 mins                    G Codes:       [name] B. Migdalia Dk PT, DPT Acute Rehabilitation  [phone] Pager [phone]     [place] 04/02/2017, 4:38 PM

## 2017-04-03 LAB — COMPREHENSIVE METABOLIC PANEL
ALK PHOS: 208 U/L — AB (ref 38–126)
ALT: 113 U/L — AB (ref 17–63)
ANION GAP: 11 (ref 5–15)
AST: 59 U/L — AB (ref 15–41)
Albumin: 2.1 g/dL — ABNORMAL LOW (ref 3.5–5.0)
BILIRUBIN TOTAL: 1.4 mg/dL — AB (ref 0.3–1.2)
BUN: 26 mg/dL — AB (ref 6–20)
CO2: 32 mmol/L (ref 22–32)
Calcium: 7.9 mg/dL — ABNORMAL LOW (ref 8.9–10.3)
Chloride: 91 mmol/L — ABNORMAL LOW (ref 101–111)
Creatinine, Ser: 1.11 mg/dL (ref 0.61–1.24)
GLUCOSE: 98 mg/dL (ref 65–99)
POTASSIUM: 3 mmol/L — AB (ref 3.5–5.1)
Sodium: 134 mmol/L — ABNORMAL LOW (ref 135–145)
TOTAL PROTEIN: 5.4 g/dL — AB (ref 6.5–8.1)

## 2017-04-03 LAB — COOXEMETRY PANEL
Carboxyhemoglobin: 1.6 % — ABNORMAL HIGH (ref 0.5–1.5)
METHEMOGLOBIN: 0.8 % (ref 0.0–1.5)
O2 Saturation: 46.7 %
Total hemoglobin: 11 g/dL — ABNORMAL LOW (ref 12.0–16.0)

## 2017-04-03 LAB — CBC
HEMATOCRIT: 27.6 % — AB (ref 39.0–52.0)
HEMOGLOBIN: 8.9 g/dL — AB (ref 13.0–17.0)
MCH: 28.6 pg (ref 26.0–34.0)
MCHC: 32.2 g/dL (ref 30.0–36.0)
MCV: 88.7 fL (ref 78.0–100.0)
PLATELETS: 275 10*3/uL (ref 150–400)
RBC: 3.11 MIL/uL — AB (ref 4.22–5.81)
RDW: 15.3 % (ref 11.5–15.5)
WBC: 16.7 10*3/uL — ABNORMAL HIGH (ref 4.0–10.5)

## 2017-04-03 MED ORDER — LEVALBUTEROL HCL 1.25 MG/0.5ML IN NEBU
1.2500 mg | INHALATION_SOLUTION | Freq: Four times a day (QID) | RESPIRATORY_TRACT | Status: DC
  Administered 2017-04-03 – 2017-04-06 (×10): 1.25 mg via RESPIRATORY_TRACT
  Filled 2017-04-03 (×10): qty 0.5

## 2017-04-03 MED ORDER — DILTIAZEM HCL 100 MG IV SOLR
10.0000 mg/h | INTRAVENOUS | Status: AC
  Administered 2017-04-03 – 2017-04-04 (×2): 10 mg/h via INTRAVENOUS
  Filled 2017-04-03 (×2): qty 100

## 2017-04-03 MED ORDER — DILTIAZEM LOAD VIA INFUSION
10.0000 mg | Freq: Once | INTRAVENOUS | Status: AC
Start: 2017-04-03 — End: 2017-04-03
  Administered 2017-04-03: 10 mg via INTRAVENOUS
  Filled 2017-04-03: qty 10

## 2017-04-03 MED ORDER — BUDESONIDE 0.25 MG/2ML IN SUSP
0.2500 mg | Freq: Two times a day (BID) | RESPIRATORY_TRACT | Status: DC
  Administered 2017-04-03 – 2017-04-09 (×10): 0.25 mg via RESPIRATORY_TRACT
  Filled 2017-04-03 (×12): qty 2

## 2017-04-03 MED ORDER — POTASSIUM CHLORIDE CRYS ER 20 MEQ PO TBCR
40.0000 meq | EXTENDED_RELEASE_TABLET | Freq: Two times a day (BID) | ORAL | Status: DC
  Administered 2017-04-03 (×2): 40 meq via ORAL
  Filled 2017-04-03 (×3): qty 2

## 2017-04-03 MED ORDER — ALBUTEROL SULFATE (5 MG/ML) 0.5% IN NEBU: 2.5000 mg | INHALATION_SOLUTION | Freq: Four times a day (QID) | RESPIRATORY_TRACT | Status: DC

## 2017-04-03 NOTE — Progress Notes (Addendum)
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  Bentall procedure.  Aortic root repleacement with bioprosthetic valve.  Reimplantation of left and right coronary arteries.  Total resection of transverse aortic arch.  Elephant trunk distal anastomosis and debranching of arch vessels. (N/A) TRANSESOPHAGEAL ECHOCARDIOGRAM (TEE) (N/A) Subjective: He is anxious to be discharged, pretty agitated overnight, calm currently  Objective: Vital signs in last 24 hours: Temp:  [97.4 F (36.3 C)-98.2 F (36.8 C)] 98.2 F (36.8 C) (08/25 0424) Pulse Rate:  [62-80] 69 (08/25 0424) Cardiac Rhythm: Normal sinus rhythm;Supraventricular tachycardia (08/25 0701) Resp:  [18-24] 22 (08/25 0424) BP: (116-139)/(57-72) 139/59 (08/25 0424) SpO2:  [85 %-98 %] 86 % (08/25 0424) FiO2 (%):  [30 %-70 %] 40 % (08/25 0217) Weight:  [197 lb (89.4 kg)] 197 lb (89.4 kg) (08/25 0424)  Hemodynamic parameters for last 24 hours:    Intake/Output from previous day: 08/24 0701 - 08/25 0700 In: 240 [P.O.:240] Out: 1700 [Urine:1700] Intake/Output this shift: Total I/O In: -  Out: 750 [Urine:750]  General appearance: alert, cooperative and no distress Heart: regular rate and rhythm and some extrasystoles Lungs: coarse with scattered ronchi and wheeze Abdomen: benign Extremities: + Bil ankle edema Wound: incis healing well   Lab Results:  Recent Labs  04/02/17 0910 04/03/17 0409  WBC 17.1* 16.7*  HGB 9.0* 8.9*  HCT 28.1* 27.6*  PLT 257 275   BMET:  Recent Labs  04/02/17 0910 04/03/17 0409  NA 136 134*  K 3.0* 3.0*  CL 95* 91*  CO2 32 32  GLUCOSE 142* 98  BUN 30* 26*  CREATININE 1.21 1.11  CALCIUM 7.9* 7.9*    PT/INR: No results for input(s): LABPROT, INR in the last 72 hours. ABG    Component Value Date/Time   PHART 7.522 (H) 03/31/2017 0500   HCO3 26.8  03/31/2017 0500   TCO2 29 03/25/2017 1627   ACIDBASEDEF 2.0 03/25/2017 0742   O2SAT 46.7 04/03/2017 0425   CBG (last 3)  No results for input(s): GLUCAP in the last 72 hours.   Results for orders placed or performed during the hospital encounter of 03/23/17  Surgical PCR screen     Status: None   Collection Time: 03/24/17  6:16 AM  Result Value Ref Range Status   MRSA, PCR NEGATIVE NEGATIVE Final   Staphylococcus aureus NEGATIVE NEGATIVE Final    Comment:        The Xpert SA Assay (FDA approved for NASAL specimens in patients over 30 years of age), is one component of a comprehensive surveillance program.  Test performance has been validated by [facility] for patients greater than or equal to 71 year old. It is not intended to diagnose infection nor to guide or monitor treatment.   Culture, expectorated sputum-assessment     Status: None   Collection Time: 03/28/17 11:57 AM  Result Value Ref Range Status   Specimen Description EXPECTORATED SPUTUM  Final   Special Requests NONE  Final   Sputum evaluation THIS SPECIMEN IS ACCEPTABLE FOR SPUTUM CULTURE  Final   Report Status 03/28/2017 FINAL  Final  Culture, respiratory (NON-Expectorated)     Status: None   Collection Time: 03/28/17 11:57 AM  Result Value Ref Range Status   Specimen Description EXPECTORATED SPUTUM  Final  Special Requests NONE Reflexed from [id number]  Final   Gram Stain   Final    MODERATE WBC PRESENT, PREDOMINANTLY PMN FEW SQUAMOUS EPITHELIAL CELLS PRESENT FEW GRAM POSITIVE RODS RARE YEAST WITH PSEUDOHYPHAE RARE GRAM POSITIVE COCCI IN CHAINS    Culture Consistent with normal respiratory flora.  Final   Report Status 03/31/2017 FINAL  Final    Meds Scheduled Meds: . aspirin EC  325 mg Oral Daily  . bisacodyl  10 mg Oral Daily   Or  . bisacodyl  10 mg Rectal Daily  . docusate sodium  200 mg Oral Daily  . enoxaparin (LOVENOX) injection  30 mg Subcutaneous Q24H  . flecainide  100 mg Oral BID  .  folic acid-pyridoxine-cyancobalamin  1 tablet Oral Daily  . furosemide  80 mg Intravenous Q8H  . ipratropium-albuterol  3 mL Nebulization Q6H  . mouth rinse  15 mL Mouth Rinse BID  . moving right along book   Does not apply Once  . nicotine  14 mg Transdermal Daily  . pantoprazole  40 mg Oral Daily  . sodium chloride flush  3 mL Intravenous Q12H   Continuous Infusions: . sodium chloride 250 mL (03/25/17 0600)  . sodium chloride    . sodium chloride    . meropenem (MERREM) IV 1 g (04/03/17 0538)   PRN Meds:.sodium chloride, sodium chloride, labetalol, ondansetron (ZOFRAN) IV, oxyCODONE, potassium chloride, sodium chloride flush, sodium chloride flush, traMADol  Xrays No results found.  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  Bentall procedure.  Aortic root repleacement with bioprosthetic valve.  Reimplantation of left and right coronary arteries.  Total resection of transverse aortic arch.  Elephant trunk distal anastomosis and debranching of arch vessels. (N/A) TRANSESOPHAGEAL ECHOCARDIOGRAM (TEE) (N/A)  1 sinus with vent ectopy/short episode SVT - cont current management, replace K+, recheck magnesium. Co-Ox stable at 47. Diuresing well, may be able to decrease diuretic soon 2 pulm congestion, cont nebs but add pulmicort nebs with significant ronchi/wheeze- push pulm toilet/rehab/PT/OT. Wean O2 as able 3 creat is improved 4 Sodium pretty stable 5 leukocytosis improved some on Merrem- sputum  cultures neg so far 6 sugars fairly well controlled, no recent A1c, will check  LOS: 10 days    GOLD,WAYNE E 04/03/2017  I have seen and examined the patient and agree with the assessment and plan as outlined.  Diuresing better and weight trending down but still above preop baseline.  Still w/ significant O2 requirement - will need to remain in the hospital until pulmonary status improves.  Recheck ECHO in 2-3 days to make sure  pericardial effusion is stable/improved.  Stop meropenem after empiric 7 day course.  Rexene Alberts, MD 04/03/2017 11:10 AM

## 2017-04-03 NOTE — Progress Notes (Signed)
Contacted by E link. Patient O2 stats are in the low 80's. Patient is messing with the line. Repositioned patient and reminded not to mess with line and encouraged to cough up phlegm. Remained in room with patient, continued to mess with the line. Unable to get sats up. Turned concentration slowly back up to 80 and will gradually try to wean back down to 40% after scheduled breathing treatment.

## 2017-04-03 NOTE — Progress Notes (Addendum)
Patient complaining earlier of lower back pain, Oxy was given and also repositioned with a pillow for support. Turned patient slowly back down to 40% and is now maintaining between 89-91%. Patient now sleeping. Will continue to monitor, bed alarm on.

## 2017-04-03 NOTE — Progress Notes (Signed)
Patient has taken oxygen off several times tonight-each time causing sats to fall into the 70's. Patient receiving 80mg  of Lasix. Because of several incontinent episodes from last night including patient dumping urine out of the urinal onto the floor, condom cath was applied while family was in the room. Did explain why it was being applied, family was in agreement. Since that application we have replaced 5 times due to the patient removing it. Also several incidents of trying to get out of the bed. Placed mitts around 05:30 prior to giving morning Lasix. Bed alarm on, will continue to monitor.

## 2017-04-03 NOTE — Progress Notes (Signed)
[place] for Merrem Indication: PNA, pulmonary issues  Allergies  Allergen Reactions  . Simvastatin Other (See Comments)    Mental status changes  . Celecoxib Other (See Comments)    GI UPSET AND INFLAMMATION  . Codeine Other (See Comments)    GI UPSET AND INFLAMMATION  . Nsaids Other (See Comments)    GI UPSET AND INFLAMMATION (can tolerate via IV)  . Tape Other (See Comments)    Medical tape and Band-Aids PULL OFF THE SKIN; please use Coban wrap  . Cephalexin Itching and Rash  . Latex Rash    Patient Measurements: Height: 6\' 1"  (185.4 cm) Weight: 197 lb (89.4 kg) IBW/kg (Calculated) : 79.9 Adjusted Body Weight:   Vital Signs: Temp: 98.1 F (36.7 C) (08/25 1315) Temp Source: Oral (08/25 1315) BP: 112/78 (08/25 1315) Pulse Rate: 88 (08/25 1315) Intake/Output from previous day: 08/24 0701 - 08/25 0700 In: 240 [P.O.:240] Out: 1700 [Urine:1700] Intake/Output from this shift: Total I/O In: 363 [P.O.:360; I.V.:3] Out: 1725 [Urine:1725]  Labs:  Recent Labs  04/01/17 0312 04/02/17 0910 04/03/17 0409  WBC 18.5* 17.1* 16.7*  HGB 8.6* 9.0* 8.9*  PLT 243 257 275  CREATININE 1.10 1.21 1.11   Estimated Creatinine Clearance: 73 mL/min (by C-G formula based on SCr of 1.11 mg/dL). No results for input(s): VANCOTROUGH, VANCOPEAK, VANCORANDOM, GENTTROUGH, GENTPEAK, GENTRANDOM, TOBRATROUGH, TOBRAPEAK, TOBRARND, AMIKACINPEAK, AMIKACINTROU, AMIKACIN in the last 72 hours.   Microbiology:   Medical History: Past Medical History:  Diagnosis Date  . Allergy   . Arthritis   . 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 Atrial fibrillation ([place])   . Cataract    removed both eyes   . GERD (gastroesophageal reflux disease)   . HYPERLIPIDEMIA   . 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 Hypertension   . Microscopic hematuria    negative cystoscopy  . NONSPECIFIC ABNORMAL ELECTROCARDIOGRAM   . S/P aortic dissection repair  03/24/2017   Biological Bentall aortic root replacement + resection and grafting of entire ascending aorta, transverse aortic arch and proximal descending thoracic aorta with elephant trunk distal anastomosis and debranching of aortic arch vessels  . 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 Varicose veins    Assessment:  ID: post/op ABX. Afebrile, WBC 17.1>16.7 (steroids). Renal stable. Merrem #7. 8/19 - adding meropenem for brown sputum, possible LUL PNA  Antimicrobials this admission:  Vanc 8/15 > 8/16 post op Levaquin 8/15 > 8/16 post op Meropenem 8/19 >   Microbiology results:  8/19 Sputum: normal flora 8/15 MRSA PCR: neg  Goal of Therapy:  Eradication of infection  Plan:  -Meropenem 1g IV q 8 hrs. Length of therapy? -Electrolyte replacement -Increase Lovenox?  Sean Riley, PharmD, BCPS Clinical Staff Pharmacist Pager [phone]  Sean Riley 04/03/2017,2:20 PM

## 2017-04-03 NOTE — Progress Notes (Signed)
CARDIAC REHAB PHASE I   PRE:  Rate/Rhythm: 87 SR  BP:  Supine:   Sitting: 98/81  Standing:    SaO2: 99% was on 30L      99% 15L nonrebreather  MODE:  Ambulation: 140 ft   POST:  Rate/Rhythm: 112 ST  BP:  Supine:   Sitting: 120/68  Standing:    SaO2: 99-100% 15 L nonrebreather   Monitored whole walk 1131-1210 Dr Roxy Manns in pt's room and stated ok to try nonrebreather on 15L to walk. Pt walked 140 ft on this with asst x2, gait belt use, rolling walker and his daughter followed with recliner. Pt maintained 99-100% as I watched sats whole walk. Pt did not need to sit. Easily distracted and looking in rooms but denied dizziness or needing to sit. Left on nonrebreather and RN and Dr Roxy Manns aware.   Graylon Good, RN BSN  04/03/2017 12:05 PM

## 2017-04-03 NOTE — Progress Notes (Addendum)
Patient has pulled condom cath off with the mitts on. He states that he took his fist and kept hitting the foley line until it came off. Will speak with day shift RN about requesting a Writer.  Tech placed another condom cath. Bed alarm on

## 2017-04-03 NOTE — Progress Notes (Signed)
04/03/2017 1630 Pt went into AFIB.  Rate 90's up to 140's.  BP 107/61.  Dr. Roxy Manns notified.  Orders received to start a cardizem gtt. [place]

## 2017-04-04 LAB — COOXEMETRY PANEL
CARBOXYHEMOGLOBIN: 1.9 % — AB (ref 0.5–1.5)
METHEMOGLOBIN: 0.7 % (ref 0.0–1.5)
O2 SAT: 63.5 %
Total hemoglobin: 9.6 g/dL — ABNORMAL LOW (ref 12.0–16.0)

## 2017-04-04 LAB — BASIC METABOLIC PANEL
Anion gap: 13 (ref 5–15)
BUN: 29 mg/dL — AB (ref 6–20)
CHLORIDE: 89 mmol/L — AB (ref 101–111)
CO2: 33 mmol/L — ABNORMAL HIGH (ref 22–32)
Calcium: 8.3 mg/dL — ABNORMAL LOW (ref 8.9–10.3)
Creatinine, Ser: 1.11 mg/dL (ref 0.61–1.24)
GFR calc Af Amer: >60 mL/min (ref >60)
GFR calc non Af Amer: >60 mL/min (ref >60)
GLUCOSE: 113 mg/dL — AB (ref 65–99)
POTASSIUM: 3.3 mmol/L — AB (ref 3.5–5.1)
Sodium: 135 mmol/L (ref 135–145)

## 2017-04-04 LAB — HEMOGLOBIN A1C
Hgb A1c MFr Bld: 5.5 % (ref 4.8–5.6)
MEAN PLASMA GLUCOSE: 111.15 mg/dL

## 2017-04-04 MED ORDER — LACTULOSE 10 GM/15ML PO SOLN
20.0000 g | Freq: Every day | ORAL | Status: DC | PRN
  Administered 2017-04-04: 20 g via ORAL
  Filled 2017-04-04: qty 30

## 2017-04-04 MED ORDER — FUROSEMIDE 10 MG/ML IJ SOLN
40.0000 mg | Freq: Two times a day (BID) | INTRAMUSCULAR | Status: DC
  Administered 2017-04-04 – 2017-04-06 (×5): 40 mg via INTRAVENOUS
  Filled 2017-04-04 (×5): qty 4

## 2017-04-04 MED ORDER — GERHARDT'S BUTT CREAM
TOPICAL_CREAM | Freq: Every day | CUTANEOUS | Status: DC
  Administered 2017-04-04 – 2017-04-06 (×3): via TOPICAL
  Filled 2017-04-04: qty 1

## 2017-04-04 MED ORDER — POTASSIUM CHLORIDE CRYS ER 20 MEQ PO TBCR
40.0000 meq | EXTENDED_RELEASE_TABLET | Freq: Three times a day (TID) | ORAL | Status: DC
  Administered 2017-04-04 – 2017-04-06 (×8): 40 meq via ORAL
  Filled 2017-04-04 (×7): qty 2

## 2017-04-04 MED ORDER — METOPROLOL TARTRATE 12.5 MG HALF TABLET
12.5000 mg | ORAL_TABLET | Freq: Two times a day (BID) | ORAL | Status: DC
  Administered 2017-04-04 – 2017-04-09 (×11): 12.5 mg via ORAL
  Filled 2017-04-04 (×11): qty 1

## 2017-04-04 NOTE — Progress Notes (Signed)
04/04/2017 1400 Pt ambulated with rolling walker 22ft on 6L O2.  Pt had to stop at front desk to rest a few minutes.  Pt placed back on high flow O2 in room at 4L.  Wife at bedside, call bell in reach. [place]

## 2017-04-04 NOTE — Progress Notes (Addendum)
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  Bentall procedure.  Aortic root repleacement with bioprosthetic valve.  Reimplantation of left and right coronary arteries.  Total resection of transverse aortic arch.  Elephant trunk distal anastomosis and debranching of arch vessels. (N/A) TRANSESOPHAGEAL ECHOCARDIOGRAM (TEE) (N/A) Subjective: afib overnight, now in sinus rhythm on cardizem gtt O2 - good progress on wean Denies SOB Fatigued more today No recent BM  Objective: Vital signs in last 24 hours: Temp:  [98.1 F (36.7 C)-98.6 F (37 C)] 98.6 F (37 C) (08/26 0000) Pulse Rate:  [85-88] 88 (08/26 0000) Cardiac Rhythm: Atrial fibrillation (08/25 1940) Resp:  [20-25] 22 (08/26 0000) BP: (105-113)/(59-78) 105/63 (08/26 0000) SpO2:  [84 %-99 %] 93 % (08/26 0926) Weight:  [184 lb 6.4 oz (83.6 kg)] 184 lb 6.4 oz (83.6 kg) (08/26 0358)  Hemodynamic parameters for last 24 hours:    Intake/Output from previous day: 08/25 0701 - 08/26 0700 In: 973 [P.O.:550; I.V.:223; IV Piggyback:200] Out: 2875 [Urine:2875] Intake/Output this shift: Total I/O In: -  Out: 950 [Urine:950]  General appearance: alert, cooperative, distracted and no distress Heart: regular rate and rhythm Lungs: dim in bases, no wheeze or ronchi currently Abdomen: + BS, some distension, nontender Extremities: + ankle and left hand edema Wound: incis healing well  Lab Results:  Recent Labs  04/02/17 0910 04/03/17 0409  WBC 17.1* 16.7*  HGB 9.0* 8.9*  HCT 28.1* 27.6*  PLT 257 275   BMET:  Recent Labs  04/03/17 0409 04/04/17 0443  NA 134* 135  K 3.0* 3.3*  CL 91* 89*  CO2 32 33*  GLUCOSE 98 113*  BUN 26* 29*  CREATININE 1.11 1.11  CALCIUM 7.9* 8.3*    PT/INR: No results for input(s): LABPROT, INR in the last 72 hours. ABG    Component Value Date/Time   PHART 7.522 (H) 03/31/2017 0500   HCO3 26.8 03/31/2017 0500   TCO2 29 03/25/2017 1627   ACIDBASEDEF 2.0 03/25/2017 0742   O2SAT 63.5 04/04/2017 0455   CBG (last 3)  No results for input(s): GLUCAP in the last 72 hours.  Meds Scheduled Meds: . aspirin EC  325 mg Oral Daily  . bisacodyl  10 mg Oral Daily   Or  . bisacodyl  10 mg Rectal Daily  . budesonide (PULMICORT) nebulizer solution  0.25 mg Nebulization BID  . docusate sodium  200 mg Oral Daily  . enoxaparin (LOVENOX) injection  30 mg Subcutaneous Q24H  . flecainide  100 mg Oral BID  . folic acid-pyridoxine-cyancobalamin  1 tablet Oral Daily  . furosemide  80 mg Intravenous Q8H  . levalbuterol  1.25 mg Nebulization Q6H  . mouth rinse  15 mL Mouth Rinse BID  . moving right along book   Does not apply Once  . nicotine  14 mg Transdermal Daily  . pantoprazole  40 mg Oral Daily  . potassium chloride  40 mEq Oral BID  . sodium chloride flush  3 mL Intravenous Q12H   Continuous Infusions: . sodium chloride 250 mL (03/25/17 0600)  . sodium chloride    . sodium chloride    . diltiazem (CARDIZEM) infusion 10 mg/hr (04/04/17 0600)  . meropenem (MERREM) IV Stopped (04/04/17 0620)   PRN Meds:.sodium chloride, sodium chloride, labetalol,  ondansetron (ZOFRAN) IV, oxyCODONE, sodium chloride flush, sodium chloride flush, traMADol  Xrays No results found.  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  Bentall procedure.  Aortic root repleacement with bioprosthetic valve.  Reimplantation of left and right coronary arteries.  Total resection of transverse aortic arch.  Elephant trunk distal anastomosis and debranching of arch vessels. (N/A) TRANSESOPHAGEAL ECHOCARDIOGRAM (TEE) (N/A)  1 steady progress overall 2 improved pulm status, cont current nebs, wean O2 as able 3 afib- now sinus on cardizem gtt. Co-Ox signif improved to 63.5. Needs more potassium replacement for 3.3 4 d/c Merrem  today 7/7 5 BUN /Creat stable, excellent diuresis, poss reduce lasix dosing soon 6 Hgb A1C 5.5 7 push rehab modalities as able 8 lactulose for constipation  LOS: 11 days    GOLD,WAYNE E 04/04/2017  I have seen and examined the patient and agree with the assessment and plan as outlined.  Stop antibiotics.  Decrease lasix.  Patient reportedly taken off of oral Cardizem in the past due to itching.  He's tolerating IV Cardizem just fine but will try starting low dose metoprolol and wean off Cardizem drip.  Watch for signs of increased AV block.  Rexene Alberts, MD 04/04/2017 10:12 AM

## 2017-04-05 ENCOUNTER — Ambulatory Visit: Payer: Medicare Other | Admitting: Internal Medicine

## 2017-04-05 LAB — BASIC METABOLIC PANEL
Anion gap: 11 (ref 5–15)
BUN: 31 mg/dL — AB (ref 6–20)
CHLORIDE: 94 mmol/L — AB (ref 101–111)
CO2: 30 mmol/L (ref 22–32)
CREATININE: 1.08 mg/dL (ref 0.61–1.24)
Calcium: 8.4 mg/dL — ABNORMAL LOW (ref 8.9–10.3)
GFR calc Af Amer: >60 mL/min (ref >60)
GFR calc non Af Amer: >60 mL/min (ref >60)
Glucose, Bld: 105 mg/dL — ABNORMAL HIGH (ref 65–99)
Potassium: 4.1 mmol/L (ref 3.5–5.1)
SODIUM: 135 mmol/L (ref 135–145)

## 2017-04-05 LAB — COOXEMETRY PANEL
Carboxyhemoglobin: 1.7 % — ABNORMAL HIGH (ref 0.5–1.5)
Methemoglobin: 0.8 % (ref 0.0–1.5)
O2 SAT: 48.2 %
TOTAL HEMOGLOBIN: 9.5 g/dL — AB (ref 12.0–16.0)

## 2017-04-05 NOTE — Care Management Important Message (Signed)
Important Message  Patient Details  Name: Sean Riley MRN: [id number] Date of Birth: [birth year]   Medicare Important Message Given:  Yes    Orbie Pyo 04/05/2017, 1:58 PM

## 2017-04-05 NOTE — Progress Notes (Addendum)
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  Bentall procedure.  Aortic root repleacement with bioprosthetic valve.  Reimplantation of left and right coronary arteries.  Total resection of transverse aortic arch.  Elephant trunk distal anastomosis and debranching of arch vessels. (N/A) TRANSESOPHAGEAL ECHOCARDIOGRAM (TEE) (N/A)  Total Length of Stay:  LOS: 12 days   Subjective: Patient about to get a breathing treatment. Wife at bedside.   Objective: Vital signs in last 24 hours: Temp:  [97.6 F (36.4 C)-98.7 F (37.1 C)] 97.6 F (36.4 C) (08/27 0335) Pulse Rate:  [70-94] 78 (08/27 0335) Cardiac Rhythm: Normal sinus rhythm (08/27 0410) Resp:  [18-24] 21 (08/27 0335) BP: (112-121)/(61-67) 117/61 (08/27 0335) SpO2:  [90 %-98 %] 98 % (08/27 0335) Weight:  [181 lb 9.6 oz (82.4 kg)] 181 lb 9.6 oz (82.4 kg) (08/27 0335)  Filed Weights   04/03/17 0424 04/04/17 0358 04/05/17 0335  Weight: 197 lb (89.4 kg) 184 lb 6.4 oz (83.6 kg) 181 lb 9.6 oz (82.4 kg)   Weight change: -2 lb 12.8 oz (-1.27 kg)      Intake/Output from previous day: 08/26 0701 - 08/27 0700 In: 330 [P.O.:330] Out: 1200 [Urine:1200]  Intake/Output this shift: No intake/output data recorded.   General appearance: alert, cooperative and no distress Heart: RRR Lungs: Slightly diminished at bases Abdomen: Soft, non tender, bowel sounds present Extremities: Mild LE edema.  Wounds: Clean and dry  Lab Results: CBC:  Recent Labs  04/02/17 0910 04/03/17 0409  WBC 17.1* 16.7*  HGB 9.0* 8.9*  HCT 28.1* 27.6*  PLT 257 275   BMET:   Recent Labs  04/04/17 0443 04/05/17 0515  NA 135 135  K 3.3* 4.1  CL 89* 94*  CO2 33* 30  GLUCOSE 113* 105*  BUN 29* 31*  CREATININE 1.11 1.08  CALCIUM 8.3* 8.4*    CMET: Lab Results  Component Value Date   WBC 16.7  (H) 04/03/2017   HGB 8.9 (L) 04/03/2017   HCT 27.6 (L) 04/03/2017   PLT 275 04/03/2017   GLUCOSE 105 (H) 04/05/2017   CHOL 162 10/05/2016   TRIG 105.0 10/05/2016   HDL 43.80 10/05/2016   LDLDIRECT 137.9 04/24/2013   LDLCALC 97 10/05/2016   ALT 113 (H) 04/03/2017   AST 59 (H) 04/03/2017   NA 135 04/05/2017   K 4.1 04/05/2017   CL 94 (L) 04/05/2017   CREATININE 1.08 04/05/2017   BUN 31 (H) 04/05/2017   CO2 30 04/05/2017   TSH 1.00 03/12/2016   PSA 7.18 (H) 01/10/2016   INR 1.09 03/24/2017   HGBA1C 5.5 04/04/2017    PT/INR: No results for input(s): LABPROT, INR in the last 72 hours. Radiology: No results found.  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  Bentall procedure.  Aortic root repleacement with bioprosthetic valve.  Reimplantation of left and right coronary arteries.  Total resection of transverse aortic arch.  Elephant trunk distal anastomosis and debranching of arch vessels. (N/A) TRANSESOPHAGEAL ECHOCARDIOGRAM (TEE) (N/A)  1. CV-SR in the 70's this am. On Flecainide 100 mg bid and Lopressor 12.5 mg bid. Co ox this am decreased to 48.2. Will obtain echo in 1-2 days to  re evaluate pericardial effusion. 2. Pulmonary-Patient with long history of tobacco abuse and is a mouth breather. Previously on higt flow oxygen now on 4L via [place]. Continue Duonebs. Encourage incentive spirometer and flutter valve.  3. Volume overload-on Lasix 40 mg IV bid. . 4. ABL anemia-Last H and H decreased to 8.9 and 27.6. Continue Foltx. 8. Continue PT/OT 9. Will consider discharge once oxygen requirement 2 L or less  ZIMMERMAN,DONIELLE M PA-C 04/05/2017 7:19 AM   I have seen and examined the patient and agree with the assessment and plan as outlined.  Looks better every day.  Oxygen requirement has decreased considerably.  Will recheck ECHO tomorrow to f/u pericardial effusion.  Possible d/c home 2-3 days if he continues to improve.  He will  need home O2 and nursing care.  Rexene Alberts, MD 04/05/2017 8:45 AM

## 2017-04-05 NOTE — Progress Notes (Signed)
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  Bentall procedure.  Aortic root repleacement with bioprosthetic valve.  Reimplantation of left and right coronary arteries.  Total resection of transverse aortic arch.     OT comments  Pt requiring maximal verbal and tactile cues to participate in grooming, bathing and dressing. Attention span very poor. Educated wife in importance of encouraging pt to self feed and complete as much of his ADL as possible. Pt unable to recall any sternal precautions, instructed pt and wife. Pt with decreased activity tolerance. May need to update discharge plan next visit depending on progress.  Follow Up Recommendations  Home health OT;Supervision/Assistance - 24 hour    Equipment Recommendations  None recommended by OT    Recommendations for Other Services      Precautions / Restrictions Precautions Precautions: Fall;Sternal Precaution Comments: Pt unable to recall any sternal precautions, reeducated.       Mobility Bed Mobility               General bed mobility comments: pt received in chair  Transfers Overall transfer level: Needs assistance Equipment used: Rolling walker (2 wheeled) Transfers: Sit to/from Stand Sit to Stand: Min assist         General transfer comment: cues for hands on knees and assist to rise and and steady    Balance Overall balance assessment: Needs assistance   Sitting balance-[name]: Good       Standing balance-[name]: Poor Standing balance comment: requires at least one hand on walker and min assist during pericare                           ADL either performed or assessed with clinical judgement   ADL Overall ADL's : Needs assistance/impaired   Eating/Feeding Details (indicate cue type and reason): wife reports  having fed him breakfast, educated in importance of pt resuming self feeding Grooming: Wash/dry hands;Wash/dry face;Brushing hair;Sitting;Supervision/safety;Minimal assistance   Upper Body Bathing: Maximal assistance;Sitting Upper Body Bathing Details (indicate cue type and reason): step by step verbal and tactile cues Lower Body Bathing: Maximal assistance;Sit to/from stand   Upper Body Dressing : Moderate assistance;Sitting   Lower Body Dressing: Total assistance;Sit to/from stand       Toileting- Water quality scientist and Hygiene: Sit to/from stand;Moderate assistance;Sitting/lateral lean         General ADL Comments: Pt fatigues quickly      Vision       Perception     Praxis      Cognition Arousal/Alertness: Awake/alert Behavior During Therapy: Flat affect Overall Cognitive Status: Impaired/Different from baseline Area of Impairment: Attention;Orientation;Memory;Following commands;Safety/judgement;Problem solving;Awareness                 Orientation Level: Disoriented to;Time Current Attention Level: Focused Memory: Decreased short-term memory;Decreased recall of precautions Following Commands: Follows one step commands with increased time (attention interfering) Safety/Judgement: Decreased awareness of safety;Decreased awareness of deficits Awareness: Emergent Problem Solving: Slow processing;Requires verbal cues General Comments: Pt requiring maximal verbal cues to sequence bathing and dressing.        Exercises     Shoulder Instructions       General Comments      Pertinent Vitals/ Pain       Pain Assessment: Faces Faces Pain Scale: Hurts little more Pain Location:  incision Pain Descriptors / Indicators: Aching;Grimacing;Guarding Pain Intervention(s): Monitored during session;Repositioned  Home Living                                          Prior Functioning/Environment              Frequency  Min 2X/week         Progress Toward Goals  OT Goals(current goals can now be found in the care plan section)  Progress towards OT goals: Not progressing toward goals - comment (pt with very poor attention)  Acute Rehab OT Goals Patient Stated Goal: to go home  OT Goal Formulation: With patient/family Time For Goal Achievement: 04/14/17 Potential to Achieve Goals: Good  Plan      Co-evaluation                 AM-PAC PT "6 Clicks" Daily Activity     Outcome Measure   Help from another person eating meals?: A Little Help from another person taking care of personal grooming?: A Lot Help from another person toileting, which includes using toliet, bedpan, or urinal?: A Lot Help from another person bathing (including washing, rinsing, drying)?: A Lot Help from another person to put on and taking off regular upper body clothing?: A Lot Help from another person to put on and taking off regular lower body clothing?: Total 6 Click Score: 12    End of Session Equipment Utilized During Treatment: Rolling walker;Oxygen (2L)  OT Visit Diagnosis: Unsteadiness on feet (R26.81);Cognitive communication deficit (R41.841);Muscle weakness (generalized) (M62.81)   Activity Tolerance Patient tolerated treatment well   Patient Left in chair;with call bell/phone within reach;with chair alarm set;with family/visitor present   Nurse Communication          Time: [phone] OT Time Calculation (min): 35 min  Charges: OT General Charges $OT Visit: 1 Procedure OT Treatments $Self Care/Home Management : 23-37 mins  04/05/2017 Nestor Lewandowsky, OTR/L Pager: [phone]  Werner Lean, Haze Boyden 04/05/2017, 10:21 AM

## 2017-04-05 NOTE — Progress Notes (Signed)
CARDIAC REHAB PHASE I   PRE:  Rate/Rhythm: 87 SR  BP:  Supine: 115/70  Sitting:   Standing:    SaO2: 91% RA  Down to 86%RA  Put back on 4L  MODE:  Ambulation: 240 ft   POST:  Rate/Rhythm: 91 SR  BP:  Supine:   Sitting: 129/59  Standing:    SaO2: 96% 4L  Was hard to read until sitting in hall. Put on 2L for room [phone] Pt was off oxygen when entering room. Sats hard to register but somewhere between 86-91%. Put on 4L to walk. Pt walked 240 ft on 4L with gait belt use, rolling walker and asst x 1 with one asst to follow with chair. Had to sit once to rest. Pt gets easily distracted by looking in rooms and not staying close to walker and not walking in middle of hall.  Checked sats sitting and at 96% on 4L.  To room and to recliner with call bell and bed alarm. Decreased to 2L and notified RN. Wife plans to take home and will need to see case manager. Pt does not have rolling walker and could benefit from [facility]   Graylon Good, RN BSN  04/05/2017 8:59 AM

## 2017-04-05 NOTE — Progress Notes (Signed)
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  Bentall procedure.  Aortic root repleacement with bioprosthetic valve.  Reimplantation of left and right coronary arteries.  Total resection of transverse aortic arch.      PT Comments    Pt is progressing towards his mobility goals today but has increased confusion and inability to follow sternal precautions and decreased safety awareness since last PT session. Pt currently, minA for transfers and ambulation of 250 with RW. Spoke with daughter about decrease in cognition and safety awareness over the weekend and possible need for additional rehab at a skilled facility before going home. Pt requires skilled PT to progress gait training and to improve safety awareness with mobility to safely navigate in his home envirionment.    Follow Up Recommendations  Home health PT;Supervision/Assistance - 24 hour     Equipment Recommendations  None recommended by PT       Precautions / Restrictions Precautions Precautions: Fall;Sternal Precaution Comments: Pt unable to recall any sternal precautions, reeducated. Restrictions Weight Bearing Restrictions: Yes (sternal)    Mobility  Bed Mobility               General bed mobility comments: in recliner at entry  Transfers Overall transfer level: Needs assistance Equipment used: Rolling walker (2 wheeled) Transfers: Sit to/from Stand Sit to Stand: Min assist;From elevated surface         General transfer comment: Pt needed assit to power up and incr cues to follow sternal precautions.   Ambulation/Gait Ambulation/Gait assistance: Min assist;+2 safety/equipment (close chair follow) Ambulation Distance (Feet): 250 Feet Assistive device: Rolling walker (2 wheeled) Gait Pattern/deviations: Decreased stride length;Decreased step  length - right;Decreased step length - left;Shuffle;Drifts right/left;Trunk flexed;Wide base of support Gait velocity: slowed Gait velocity interpretation: Below normal speed for age/gender General Gait Details: minA for steadying,vc for sequencing of RW, and maintaining focus on ambulation, pt with frequent stops to look in rooms, required 1 seated rest break due to fatigue       Balance Overall balance assessment: Needs assistance Sitting-balance support: No upper extremity supported;Feet supported Sitting balance-[name]: Good     Standing balance support: During functional activity Standing balance-[name]: Poor Standing balance comment: requires RW assist                             Cognition Arousal/Alertness: Awake/alert Behavior During Therapy: Flat affect Overall Cognitive Status: Impaired/Different from baseline Area of Impairment: Attention;Orientation;Memory;Following commands;Safety/judgement;Problem solving;Awareness                 Orientation Level: Disoriented to;Time Current Attention Level: Focused Memory: Decreased short-term memory;Decreased recall of precautions Following Commands: Follows one step commands with increased time (attention interfering) Safety/Judgement: Decreased awareness of safety;Decreased awareness of deficits Awareness: Emergent Problem Solving: Slow processing;Requires verbal cues General Comments: Pt requiring maximal verbal cues to sequence tasks like putting his feet back on the recliner to put foot rest down      Exercises      General Comments General comments (skin integrity, edema, etc.): Pt on 3L O2 via nasal cannula throughout session and maintained SaO2 >88%O2 with gait       Pertinent Vitals/Pain Pain Assessment: Faces Faces Pain Scale: Hurts a little bit Pain Location: incision Pain Descriptors / Indicators: Aching;Grimacing;Guarding Pain Intervention(s): Monitored during session;Limited  activity within patient's tolerance           PT Goals (current goals can now be found in the care plan section) Acute Rehab PT Goals Patient Stated Goal: to go home  PT Goal Formulation: With patient Time For Goal Achievement: 04/12/17 Potential to Achieve Goals: Good Progress towards PT goals: Progressing toward goals    Frequency    Min 3X/week      PT Plan Current plan remains appropriate       AM-PAC PT "6 Clicks" Daily Activity  Outcome Measure  Difficulty turning over in bed (including adjusting bedclothes, sheets and blankets)?: Unable Difficulty moving from lying on back to sitting on the side of the bed? : A Lot Difficulty sitting down on and standing up from a chair with arms (e.g., wheelchair, bedside commode, etc,.)?: A Lot Help needed moving to and from a bed to chair (including a wheelchair)?: A Little Help needed walking in hospital room?: A Little Help needed climbing 3-5 steps with a railing? : Total 6 Click Score: 12    End of Session Equipment Utilized During Treatment: Gait belt;Oxygen Activity Tolerance: Patient limited by fatigue Patient left: in chair;with call bell/phone within reach;with chair alarm set;with family/visitor present Nurse Communication: Mobility status PT Visit Diagnosis: Unsteadiness on feet (R26.81);Muscle weakness (generalized) (M62.81)     Time: [id number] PT Time Calculation (min) (ACUTE ONLY): 26 min  Charges:  $Gait Training: 23-37 mins                    G Codes:       Caliah Kopke B. Migdalia Dk PT, DPT Acute Rehabilitation  [phone] Pager [phone]     [place] 04/05/2017, 1:35 PM

## 2017-04-06 ENCOUNTER — Inpatient Hospital Stay (HOSPITAL_COMMUNITY): Payer: Medicare Other

## 2017-04-06 DIAGNOSIS — I7101 Dissection of thoracic aorta: Secondary | ICD-10-CM

## 2017-04-06 LAB — ECHOCARDIOGRAM LIMITED
E decel time: 218 msec
E/e' ratio: 11.41
FS: 11 % — AB (ref 28–44)
Height: 73 in
IVS/LV PW RATIO, ED: 0.9
LV PW d: 10 mm — AB (ref 0.6–1.1)
LV TDI E'LATERAL: 5.8
LV TDI E'MEDIAL: 8.1
LV e' LATERAL: 5.8 cm/s
LVEEAVG: 11.41
LVEEMED: 11.41
LVOT area: 4.15 cm2
LVOT diameter: 23 mm
MV Dec: 218
MVPKAVEL: 56.3 m/s
MVPKEVEL: 66.2 m/s
Weight: 2923.2 oz

## 2017-04-06 MED ORDER — LEVALBUTEROL HCL 1.25 MG/0.5ML IN NEBU: 1.2500 mg | INHALATION_SOLUTION | Freq: Four times a day (QID) | RESPIRATORY_TRACT | Status: DC | PRN

## 2017-04-06 NOTE — Progress Notes (Addendum)
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  Bentall procedure.  Aortic root repleacement with bioprosthetic valve.  Reimplantation of left and right coronary arteries.  Total resection of transverse aortic arch.  Elephant trunk distal anastomosis and debranching of arch vessels. (N/A) TRANSESOPHAGEAL ECHOCARDIOGRAM (TEE) (N/A)  Total Length of Stay:  LOS: 13 days   Subjective: Patient confused this am. He does know he is in the hospital.  Objective: Vital signs in last 24 hours: Temp:  [97.3 F (36.3 C)-99.5 F (37.5 C)] 97.3 F (36.3 C) (08/28 0417) Pulse Rate:  [72-87] 75 (08/28 0417) Cardiac Rhythm: Normal sinus rhythm;Bundle branch block (08/28 0410) Resp:  [13-26] 18 (08/28 0417) BP: (100-117)/(57-78) 109/78 (08/28 0417) SpO2:  [92 %-97 %] 95 % (08/28 0417) Weight:  [182 lb 11.2 oz (82.9 kg)] 182 lb 11.2 oz (82.9 kg) (08/28 0229)  Filed Weights   04/04/17 0358 04/05/17 0335 04/06/17 0229  Weight: 184 lb 6.4 oz (83.6 kg) 181 lb 9.6 oz (82.4 kg) 182 lb 11.2 oz (82.9 kg)   Weight change: 1 lb 1.6 oz (0.499 kg)      Intake/Output from previous day: 08/27 0701 - 08/28 0700 In: 120 [P.O.:120] Out: -   Intake/Output this shift: No intake/output data recorded.   Heart: RRR Lungs: Slightly diminished at bases Abdomen: Soft, non tender, bowel sounds present Extremities: Mild LE edema.  Wounds: Clean and dry  Lab Results: CBC: No results for input(s): WBC, HGB, HCT, PLT in the last 72 hours. BMET:   Recent Labs  04/04/17 0443 04/05/17 0515  NA 135 135  K 3.3* 4.1  CL 89* 94*  CO2 33* 30  GLUCOSE 113* 105*  BUN 29* 31*  CREATININE 1.11 1.08  CALCIUM 8.3* 8.4*    CMET: Lab Results  Component Value Date   WBC 16.7 (H) 04/03/2017   HGB 8.9 (L) 04/03/2017   HCT 27.6 (L) 04/03/2017   PLT 275 04/03/2017   GLUCOSE 105 (H) 04/05/2017   CHOL 162 10/05/2016   TRIG 105.0 10/05/2016   HDL 43.80 10/05/2016   LDLDIRECT 137.9 04/24/2013   LDLCALC 97 10/05/2016   ALT 113 (H) 04/03/2017   AST 59 (H) 04/03/2017   NA 135 04/05/2017   K 4.1 04/05/2017   CL 94 (L) 04/05/2017   CREATININE 1.08 04/05/2017   BUN 31 (H) 04/05/2017   CO2 30 04/05/2017   TSH 1.00 03/12/2016   PSA 7.18 (H) 01/10/2016   INR 1.09 03/24/2017   HGBA1C 5.5 04/04/2017    PT/INR: No results for input(s): LABPROT, INR in the last 72 hours. Radiology: No results found.  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  Bentall procedure.  Aortic root repleacement with bioprosthetic valve.  Reimplantation of left and right coronary arteries.  Total resection of transverse aortic arch.  Elephant trunk distal anastomosis and debranching of arch vessels. (N/A) TRANSESOPHAGEAL ECHOCARDIOGRAM (TEE) (N/A)  1. CV-SR in the 70's this am. On Flecainide 100 mg bid and Lopressor 12.5 mg bid. Co ox this am decreased to 48.2. Will obtain echo in 1-2 days to re evaluate pericardial effusion. 2. Pulmonary-Patient with long history of tobacco abuse and is a mouth  breather. Previously on higt flow oxygen now on 2L via [place]. Continue Duonebs. Encourage incentive spirometer and flutter valve.  3. Volume overload-on Lasix 40 mg IV bid. Will discuss with Dr. Roxy Manns when to change to oral. 4. ABL anemia-Last H and H decreased to 8.9 and 27.6. Continue Foltx. 8. Continue PT/OT-will order home PT/OT as recommended 9. Patient has had intermittent confusion post op. Likely related to surgery and prolonged hospitalization. Hopefully, will improve with time. 10. Will likely consider discharge in am  ZIMMERMAN,DONIELLE M PA-C 04/06/2017 7:23 AM    I have seen and examined the patient and agree with the assessment and plan as outlined.  Check ECHO today f/u pericardial effusion.  Rexene Alberts,  MD 04/06/2017 1:29 PM

## 2017-04-06 NOTE — Progress Notes (Signed)
  Echocardiogram 2D Echocardiogram has been performed.  Johny Chess 04/06/2017, 12:14 PM

## 2017-04-06 NOTE — Progress Notes (Signed)
CARDIAC REHAB PHASE I   PRE:  Rate/Rhythm: 77 SR  BP:  Supine:   Sitting: 102/65  Standing:    SaO2: 97%RA  MODE:  Ambulation: 330 ft   POST:  Rate/Rhythm: 90 SR  BP:  Supine:   Sitting: 118/64  Standing:    SaO2: 100%RA 1055-1145 Pt assisted to void prior to walk. Pt encouraged to use his arms as he c/o feeling heavy. Pt walked 330 ft on RA with rolling walker, gait belt use and asst x 1 with one assist to follow with recliner. Pt had to sit twice to rest. Sats a little difficult to get at times but when it would register, it was above 90%. Tired by end of walk. Assisted to bed for ECHO. Pt still gets easily distracted looking in rooms and does not adhere to sternal precautions when standing. Needs cues for sternal precautions.    Graylon Good, RN BSN  04/06/2017 11:40 AM

## 2017-04-06 NOTE — Progress Notes (Signed)
TCTS BRIEF PROGRESS NOTE   Follow up ECHO reviewed.  Mr Sanden' pericardial effusion appears somewhat larger, although there are still no signs of pericardial tamponade.  I am concerned that the effusion has enlarged despite the fact that the patient's volume overload has essentially resolved with diuretic therapy, and I would be uncomfortable sending him home without draining it first.  ECHO reviewed with Dr Burt Knack who agrees that it appears amenable to percutaneous pericardiocentesis.  Discussed the indications, risks and benefits w/ Mr Mcnab and his wife.  They understand that pericardial window might be necessary if the fluid cannot be drained percutaneously.  Tentatively for pericardiocentesis in the cath lab tomorrow.  Rexene Alberts, MD 04/06/2017 2:00 PM

## 2017-04-06 NOTE — Care Management Note (Signed)
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  Bentall procedure 8/15                 Action/Plan: PTA pt lived at home with wife-independent,  CM to follow for d/c needs  Expected Discharge Date:                  Expected Discharge Plan:  [place]  In-House Referral:     Discharge planning Services  CM Consult  Post Acute Care Choice:  Durable Medical Equipment, Home Health Choice offered to:  Spouse  DME Arranged:  Gilford Rile rolling DME Agency:  [place] Arranged:  RN, PT, OT [facility] Agency:  [place]  Status of Service:  Completed, signed off  If discussed at [place] of Stay Meetings, dates discussed:  8/23, 8/28  Discharge Disposition: home/home health   Additional Comments:  04/06/17- 1030- [name] RN, CM- orders placed for HHRN/PT/OT- spoke with wife at bedside- choice offered for [facility] services- per wife she does not have a preference- ok with [name] for [facility] services- referral called to [name] with [name] and referral accepted for services- per wife pt will also need RW for home- notified [name] with [facility] for DME needs- RW to be delivered to room prior to discharge.  Pt will also most likely need home 02 and will need orders for this to arrange for home.   04/01/17- 1525- [name] RN, CM- pt continues to need HF 02- vol. Overload- recommendation for [facility]- if progresses to going home will need [place] orders for discharge-   [name], RN 04/06/2017, 10:49 AM

## 2017-04-07 ENCOUNTER — Inpatient Hospital Stay (HOSPITAL_COMMUNITY)
Admission: EM | Disposition: A | Discharge status: Home Health | Payer: Self-pay | Source: Home / Self Care | Attending: Thoracic Surgery (Cardiothoracic Vascular Surgery)

## 2017-04-07 ENCOUNTER — Inpatient Hospital Stay (HOSPITAL_COMMUNITY): Payer: Medicare Other

## 2017-04-07 ENCOUNTER — Encounter (HOSPITAL_COMMUNITY): Payer: Self-pay

## 2017-04-07 DIAGNOSIS — I3139 Other pericardial effusion (noninflammatory): Secondary | ICD-10-CM

## 2017-04-07 DIAGNOSIS — I313 Pericardial effusion (noninflammatory): Secondary | ICD-10-CM

## 2017-04-07 HISTORY — PX: PERICARDIOCENTESIS: CATH118255

## 2017-04-07 LAB — GRAM STAIN

## 2017-04-07 LAB — POCT I-STAT 3, ART BLOOD GAS (G3+)
Acid-Base Excess: 2 mmol/L (ref 0.0–2.0)
BICARBONATE: 24.1 mmol/L (ref 20.0–28.0)
O2 SAT: 95 %
PO2 ART: 65 mmHg — AB (ref 83.0–108.0)
TCO2: 25 mmol/L (ref 22–32)
pCO2 arterial: 29.3 mmHg — ABNORMAL LOW (ref 32.0–48.0)
pH, Arterial: 7.523 — ABNORMAL HIGH (ref 7.350–7.450)

## 2017-04-07 LAB — BODY FLUID CELL COUNT WITH DIFFERENTIAL
EOS FL: 1 %
Lymphs, Fluid: 47 %
Monocyte-Macrophage-Serous Fluid: 9 % — ABNORMAL LOW (ref 50–90)
Neutrophil Count, Fluid: 42 % — ABNORMAL HIGH (ref 0–25)
Total Nucleated Cell Count, Fluid: 975 cu mm (ref 0–1000)

## 2017-04-07 LAB — GLUCOSE, CAPILLARY: GLUCOSE-CAPILLARY: 64 mg/dL — AB (ref 65–99)

## 2017-04-07 SURGERY — PERICARDIOCENTESIS
Anesthesia: LOCAL

## 2017-04-07 MED ORDER — CHLORHEXIDINE GLUCONATE CLOTH 2 % EX PADS
6.0000 | MEDICATED_PAD | Freq: Every day | CUTANEOUS | Status: DC
  Administered 2017-04-08: 6 via TOPICAL

## 2017-04-07 MED ORDER — MIDAZOLAM HCL 2 MG/2ML IJ SOLN
INTRAMUSCULAR | Status: AC
  Filled 2017-04-07: qty 2

## 2017-04-07 MED ORDER — SODIUM CHLORIDE 0.9% FLUSH: 10.0000 mL | INTRAVENOUS | Status: DC | PRN

## 2017-04-07 MED ORDER — HEPARIN (PORCINE) IN NACL 2-0.9 UNIT/ML-% IJ SOLN
INTRAMUSCULAR | Status: AC | PRN
  Administered 2017-04-07: 500 mL

## 2017-04-07 MED ORDER — POTASSIUM CHLORIDE CRYS ER 20 MEQ PO TBCR
20.0000 meq | EXTENDED_RELEASE_TABLET | Freq: Every day | ORAL | Status: DC
  Administered 2017-04-07: 20 meq via ORAL
  Filled 2017-04-07: qty 1

## 2017-04-07 MED ORDER — SODIUM CHLORIDE 0.9% FLUSH
10.0000 mL | Freq: Two times a day (BID) | INTRAVENOUS | Status: DC
  Administered 2017-04-07: 20 mL
  Administered 2017-04-08 (×2): 10 mL

## 2017-04-07 MED ORDER — FUROSEMIDE 40 MG PO TABS: 40.0000 mg | ORAL_TABLET | Freq: Every day | ORAL | Status: DC

## 2017-04-07 MED ORDER — LIDOCAINE HCL (PF) 1 % IJ SOLN
INTRAMUSCULAR | Status: AC
  Filled 2017-04-07: qty 60

## 2017-04-07 MED ORDER — DEXTROSE 50 % IV SOLN
25.0000 mL | Freq: Once | INTRAVENOUS | Status: AC
  Administered 2017-04-07: 25 mL via INTRAVENOUS

## 2017-04-07 MED ORDER — HEPARIN (PORCINE) IN NACL 2-0.9 UNIT/ML-% IJ SOLN
INTRAMUSCULAR | Status: AC
  Filled 2017-04-07: qty 500

## 2017-04-07 MED ORDER — DEXTROSE 50 % IV SOLN
INTRAVENOUS | Status: AC
  Filled 2017-04-07: qty 50

## 2017-04-07 MED ORDER — FENTANYL CITRATE (PF) 100 MCG/2ML IJ SOLN
INTRAMUSCULAR | Status: AC
  Filled 2017-04-07: qty 2

## 2017-04-07 MED ORDER — LIDOCAINE HCL (PF) 1 % IJ SOLN
INTRAMUSCULAR | Status: DC | PRN
  Administered 2017-04-07: 45 mL

## 2017-04-07 MED ORDER — OXYCODONE HCL 5 MG PO TABS
5.0000 mg | ORAL_TABLET | ORAL | Status: DC | PRN
  Administered 2017-04-07 – 2017-04-08 (×3): 5 mg via ORAL
  Filled 2017-04-07 (×3): qty 1

## 2017-04-07 SURGICAL SUPPLY — 7 items
EVACUATOR 1/8 PVC DRAIN (DRAIN) ×1 IMPLANT
KIT MICROINTRODUCER STIFF 5F (SHEATH) ×1 IMPLANT
PACK CARDIAC CATHETERIZATION (CUSTOM PROCEDURE TRAY) ×1 IMPLANT
PERIVAC PERICARDIOCENTESIS 8.3 (TRAY / TRAY PROCEDURE) ×1 IMPLANT
PROTECTION STATION PRESSURIZED (MISCELLANEOUS) ×2
STATION PROTECTION PRESSURIZED (MISCELLANEOUS) IMPLANT
TUBING CIL FLEX 10 FLL-RA (TUBING) ×1 IMPLANT

## 2017-04-07 NOTE — Progress Notes (Addendum)
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  Bentall procedure.  Aortic root repleacement with bioprosthetic valve.  Reimplantation of left and right coronary arteries.  Total resection of transverse aortic arch.  Elephant trunk distal anastomosis and debranching of arch vessels. (N/A) TRANSESOPHAGEAL ECHOCARDIOGRAM (TEE) (N/A)  Total Length of Stay:  LOS: 14 days   Subjective: Patient confused again this am.   Objective: Vital signs in last 24 hours: Temp:  [97.7 F (36.5 C)-98.5 F (36.9 C)] 98.1 F (36.7 C) (08/29 0411) Pulse Rate:  [71-92] 73 (08/29 0411) Cardiac Rhythm: Normal sinus rhythm (08/29 0411) Resp:  [18-24] 18 (08/29 0411) BP: (102-127)/(62-68) 127/68 (08/29 0411) SpO2:  [95 %-100 %] 100 % (08/29 0411) Weight:  [182 lb 3.2 oz (82.6 kg)] 182 lb 3.2 oz (82.6 kg) (08/29 0411)  Filed Weights   04/05/17 0335 04/06/17 0229 04/07/17 0411  Weight: 181 lb 9.6 oz (82.4 kg) 182 lb 11.2 oz (82.9 kg) 182 lb 3.2 oz (82.6 kg)   Weight change: -8 oz (-0.227 kg)      Intake/Output from previous day: 08/28 0701 - 08/29 0700 In: 690 [P.O.:690] Out: 120 [Urine:120]  Intake/Output this shift: No intake/output data recorded.   Heart: RRR Lungs: Slightly diminished at bases Abdomen: Soft, non tender, bowel sounds present Extremities: Mild LE edema.  Wounds: Clean and dry  Lab Results: CBC: No results for input(s): WBC, HGB, HCT, PLT in the last 72 hours. BMET:   Recent Labs  04/05/17 0515  NA 135  K 4.1  CL 94*  CO2 30  GLUCOSE 105*  BUN 31*  CREATININE 1.08  CALCIUM 8.4*    CMET: Lab Results  Component Value Date   WBC 16.7 (H) 04/03/2017   HGB 8.9 (L) 04/03/2017   HCT 27.6 (L) 04/03/2017   PLT 275 04/03/2017   GLUCOSE 105 (H) 04/05/2017   CHOL 162 10/05/2016   TRIG 105.0 10/05/2016   HDL 43.80  10/05/2016   LDLDIRECT 137.9 04/24/2013   LDLCALC 97 10/05/2016   ALT 113 (H) 04/03/2017   AST 59 (H) 04/03/2017   NA 135 04/05/2017   K 4.1 04/05/2017   CL 94 (L) 04/05/2017   CREATININE 1.08 04/05/2017   BUN 31 (H) 04/05/2017   CO2 30 04/05/2017   TSH 1.00 03/12/2016   PSA 7.18 (H) 01/10/2016   INR 1.09 03/24/2017   HGBA1C 5.5 04/04/2017    PT/INR: No results for input(s): LABPROT, INR in the last 72 hours. Radiology: No results found.  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  Bentall procedure.  Aortic root repleacement with bioprosthetic valve.  Reimplantation of left and right coronary arteries.  Total resection of transverse aortic arch.  Elephant trunk distal anastomosis and debranching of arch vessels. (N/A) TRANSESOPHAGEAL ECHOCARDIOGRAM (TEE) (N/A)  1. CV-SR in the 70's this am. On Flecainide 100 mg bid and Lopressor 12.5 mg bid.Echo yesterday showed LVEF 40-45%, moderate to large pericardial, no tamponade. Scheduled for pericardiocentesis. 2. Pulmonary-Patient with long history of tobacco abuse and is a mouth breather. Previously on higt flow oxygen now weaned to room air. Continue Duonebs. Encourage incentive spirometer and flutter valve.  3. Volume overload-on Lasix  40 mg IV bid. Will discuss with Dr. Roxy Manns when to change to oral. 4. ABL anemia-Last H and H decreased to 8.9 and 27.6. Continue Foltx. 8. Continue PT/OT-I ordered home PT/OT as recommended 9. Patient has had intermittent confusion post op. Likely related to surgery and prolonged hospitalization. Hopefully, will improve with time. 10. Possible discharge in am  ZIMMERMAN,DONIELLE M PA-C 04/07/2017 7:35 AM   I have seen and examined the patient and agree with the assessment and plan as outlined.  For pericardiocentesis today.    Rexene Alberts, MD 04/07/2017 8:32 AM

## 2017-04-07 NOTE — Progress Notes (Signed)
Interventional Cardiology Note: 66 year old male, patient of Dr. Roxy Manns who underwent emergency surgical repair of an acute ascending aortic dissection requiring aortic root replacement, bioprosthetic aortic valve replacement, coronary reimplantation, and total resection of the transverse aortic arch with elephant trunk distal anastomosis. The patient has slowly improved over the course of his hospitalization, but he has been noted to have a pericardial effusion which has enlarged on serial echo imaging. After review of the case and echo images with Dr. Roxy Manns, we agree that his effusion should be drained prior to hospital discharge. We have reviewed the specific risks, indications, and alternatives with the patient and his wife at the bedside. The patient was seen along with Dr. Roxy Manns yesterday afternoon. Plans for pericardiocentesis later today.  Sherren Mocha 04/07/2017 7:45 AM

## 2017-04-07 NOTE — Progress Notes (Signed)
Patient ID: Sean Riley, male   DOB: 1950-11-07, 66 y.o.   MRN: [id number]  SICU Evening Rounds:  Hemodynamically stable after pericardiocentesis this afternoon. 650 cc of serosanguinous drainage removed.  Sinus 75  Awake and alert. Family visiting.

## 2017-04-07 NOTE — Progress Notes (Signed)
Donielle,PA here assessed orders received. Followed. Awaiting bed assignment

## 2017-04-07 NOTE — H&P (Signed)
Report called to nurse on [place].

## 2017-04-07 NOTE — Progress Notes (Signed)
ABG drawn.

## 2017-04-07 NOTE — Progress Notes (Signed)
Page to Lars Pinks re ABGs awaiting cal back

## 2017-04-07 NOTE — Progress Notes (Signed)
Pateint arrival in Cath Lab Recovery Room with percardial catheter in place located left apex. Catheter is connected to vacuum and is under current suction. Patient is lethargic however is able to answer question. Sterile dressing is dry and intact. A re breather was placed on patient due to desaturation. Bp: 144/71, HR: 81, Sp02:90.

## 2017-04-07 NOTE — Progress Notes (Signed)
Physical Therapy  Treatment Cancellation Note   04/07/17 1029  PT Visit Information  Last PT Received On 04/07/17  Reason Eval/Treat Not Completed Patient at procedure or test/unavailable; Pt in OR. PT will continue to follow acutely.    Earney Navy, PTA Pager: [phone]

## 2017-04-07 NOTE — Progress Notes (Signed)
Pt. Received to bay#3 - pt. Obtunded responds to verbal but not appropriate. MD-made aware. Pt. On High flow 02- orders received and placed- call to Radiology for stat portable Ches x-ray. L anterior cath at heart apex secured in placed, Dressing clean, dry, and intact. Drainage from site scant noted in wound collection system. Monitoring and awaiting bed.

## 2017-04-08 ENCOUNTER — Encounter (HOSPITAL_COMMUNITY): Payer: Self-pay | Admitting: Cardiovascular Disease

## 2017-04-08 ENCOUNTER — Inpatient Hospital Stay (HOSPITAL_COMMUNITY): Payer: Medicare Other

## 2017-04-08 DIAGNOSIS — I313 Pericardial effusion (noninflammatory): Secondary | ICD-10-CM

## 2017-04-08 LAB — PH, BODY FLUID: PH, BODY FLUID: 7.8

## 2017-04-08 LAB — COMPREHENSIVE METABOLIC PANEL
ALT: 42 U/L (ref 17–63)
AST: 26 U/L (ref 15–41)
Albumin: 2.3 g/dL — ABNORMAL LOW (ref 3.5–5.0)
Alkaline Phosphatase: 146 U/L — ABNORMAL HIGH (ref 38–126)
Anion gap: 12 (ref 5–15)
BUN: 19 mg/dL (ref 6–20)
CO2: 18 mmol/L — ABNORMAL LOW (ref 22–32)
Calcium: 8.2 mg/dL — ABNORMAL LOW (ref 8.9–10.3)
Chloride: 104 mmol/L (ref 101–111)
Creatinine, Ser: 0.87 mg/dL (ref 0.61–1.24)
GFR calc Af Amer: >60 mL/min (ref >60)
GFR calc non Af Amer: >60 mL/min (ref >60)
Glucose, Bld: 99 mg/dL (ref 65–99)
Potassium: 4.3 mmol/L (ref 3.5–5.1)
Sodium: 134 mmol/L — ABNORMAL LOW (ref 135–145)
Total Bilirubin: 1.2 mg/dL (ref 0.3–1.2)
Total Protein: 6.4 g/dL — ABNORMAL LOW (ref 6.5–8.1)

## 2017-04-08 LAB — ECHOCARDIOGRAM LIMITED
Height: 73 in
Weight: 2955.93 oz

## 2017-04-08 LAB — GLUCOSE, BODY FLUID OTHER: Glucose, Body Fluid Other: 51 mg/dL

## 2017-04-08 LAB — CBC
HCT: 29.2 % — ABNORMAL LOW (ref 39.0–52.0)
Hemoglobin: 9.2 g/dL — ABNORMAL LOW (ref 13.0–17.0)
MCH: 28.8 pg (ref 26.0–34.0)
MCHC: 31.5 g/dL (ref 30.0–36.0)
MCV: 91.3 fL (ref 78.0–100.0)
Platelets: 429 10*3/uL — ABNORMAL HIGH (ref 150–400)
RBC: 3.2 MIL/uL — ABNORMAL LOW (ref 4.22–5.81)
RDW: 15.3 % (ref 11.5–15.5)
WBC: 10.4 10*3/uL (ref 4.0–10.5)

## 2017-04-08 LAB — GLUCOSE, CAPILLARY: GLUCOSE-CAPILLARY: 98 mg/dL (ref 65–99)

## 2017-04-08 LAB — PROTEIN, BODY FLUID (OTHER): Total Protein, Body Fluid Other: 4.8 g/dL

## 2017-04-08 LAB — LD, BODY FLUID (OTHER): LD, Body Fluid: 1545 IU/L

## 2017-04-08 LAB — PREALBUMIN: Prealbumin: 9.2 mg/dL — ABNORMAL LOW (ref 18–38)

## 2017-04-08 MED ORDER — PANTOPRAZOLE SODIUM 40 MG PO PACK: 40.0000 mg | PACK | Freq: Every day | ORAL | Status: DC

## 2017-04-08 MED ORDER — FUROSEMIDE 40 MG PO TABS
40.0000 mg | ORAL_TABLET | Freq: Every day | ORAL | Status: DC
  Administered 2017-04-09: 40 mg via ORAL
  Filled 2017-04-08: qty 1

## 2017-04-08 MED ORDER — PANTOPRAZOLE SODIUM 40 MG PO PACK
40.0000 mg | PACK | Freq: Every day | ORAL | Status: DC
  Administered 2017-04-08 – 2017-04-09 (×2): 40 mg via ORAL
  Filled 2017-04-08 (×2): qty 20

## 2017-04-08 MED ORDER — ALBUTEROL SULFATE (2.5 MG/3ML) 0.083% IN NEBU: 2.5000 mg | INHALATION_SOLUTION | Freq: Four times a day (QID) | RESPIRATORY_TRACT | Status: DC | PRN

## 2017-04-08 MED ORDER — POTASSIUM CHLORIDE CRYS ER 20 MEQ PO TBCR
20.0000 meq | EXTENDED_RELEASE_TABLET | Freq: Every day | ORAL | Status: DC
  Administered 2017-04-09: 20 meq via ORAL
  Filled 2017-04-08: qty 1

## 2017-04-08 MED ORDER — ROSUVASTATIN CALCIUM 10 MG PO TABS
5.0000 mg | ORAL_TABLET | ORAL | Status: DC
  Administered 2017-04-08: 5 mg via ORAL
  Filled 2017-04-08: qty 1

## 2017-04-08 MED FILL — Midazolam HCl Inj 2 MG/2ML (Base Equivalent): INTRAMUSCULAR | Qty: 2 | Status: AC

## 2017-04-08 MED FILL — Fentanyl Citrate Preservative Free (PF) Inj 100 MCG/2ML: INTRAMUSCULAR | Qty: 2 | Status: AC

## 2017-04-08 NOTE — Progress Notes (Signed)
Spoke with Ellwood Handler. Verbal order received to discontinue PICC line. IV team consult placed. Pt will discharge tomorrow. Given verbal order that patient can remain without IV access for the evening.   Fritz Pickerel, RN

## 2017-04-08 NOTE — Progress Notes (Signed)
Pt progressively confused into evening. Will continue to attempt to reorient the patient to situation. Pt oriented to self and place. Pt says he will call 911 to get him out of here - reemphasized to patient the plan is to discharge in the morning. Bed alarm on. Will continue to monitor.   Fritz Pickerel, RN

## 2017-04-08 NOTE — Progress Notes (Signed)
CARDIAC REHAB PHASE I   PRE:  Rate/Rhythm: 80 SR  BP:  Sitting: 109/70        SaO2: 98 RA  MODE:  Ambulation: 240 ft   POST:  Rate/Rhythm: 87 SR  BP:  Sitting: 106/83         SaO2: 98 RA  Pt very drowsy, agreeable to walk. Pt ambulated 240 ft on RA, rolling walker, gait belt, assist x2, slow, mildly unsteady gait at times, tolerated well. Pt with mild DOE, some fatigue with distance, however, overall demonstrated improved activity tolerance. Sats 98% on RA. Pt to recliner after walk, feet elevated, call bell within reach. Will follow. Can be x1.   [id number] Lenna Sciara, RN, BSN 04/08/2017 2:31 PM

## 2017-04-08 NOTE — Progress Notes (Signed)
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  Bentall procedure.  Aortic root repleacement with bioprosthetic valve.  Reimplantation of left and right coronary arteries.  Total resection of transverse aortic arch, 8/28 pericardiocentesis     PT Comments    Pt continues to progress towards his goals and is very anxious to discharge from the hospital. Pt currently min guard for bed mobility, and minA for transfers and ambulation of 370 feet. Pt requires maximal cuing for staying for sequencing and staying on task, and for maintaining sternal precautions. Pt requires skilled PT to progress safety with mobility and to improve strength and endurance to safely navigate in his home environment at discharge.      Follow Up Recommendations  Home health PT;Supervision/Assistance - 24 hour     Equipment Recommendations  None recommended by PT    Recommendations for Other Services       Precautions / Restrictions Precautions Precautions: Fall;Sternal Precaution Comments: Pt unable to recall any sternal precautions, reeducated. Restrictions Weight Bearing Restrictions: Yes (sternal)    Mobility  Bed Mobility Overal bed mobility: Needs Assistance Bed Mobility: Supine to Sit     Supine to sit: Min guard     General bed mobility comments: vc to stay on task, and for sequencing rolling over and managing LE out of bed   Transfers Overall transfer level: Needs assistance Equipment used: Rolling walker (2 wheeled) Transfers: Sit to/from Stand Sit to Stand: Min assist;From elevated surface         General transfer comment: Pt needed assit to power up and incr cues to follow sternal precautions.   Ambulation/Gait Ambulation/Gait assistance: Min assist;+2 safety/equipment (close chair follow) Ambulation Distance (Feet): 370  Feet Assistive device: Rolling walker (2 wheeled) Gait Pattern/deviations: Decreased stride length;Decreased step length - right;Decreased step length - left;Shuffle;Drifts right/left;Trunk flexed;Wide base of support Gait velocity: slowed Gait velocity interpretation: Below normal speed for age/gender General Gait Details: minA for steadying,vc for sequencing of RW, and maintaining focus on ambulation       Balance Overall balance assessment: Needs assistance Sitting-balance support: No upper extremity supported;Feet supported Sitting balance-[name]: Good     Standing balance support: During functional activity Standing balance-[name]: Poor Standing balance comment: requires RW assist                             Cognition Arousal/Alertness: Awake/alert Behavior During Therapy: Flat affect Overall Cognitive Status: Impaired/Different from baseline Area of Impairment: Attention;Orientation;Memory;Following commands;Safety/judgement;Problem solving;Awareness                 Orientation Level: Disoriented to;Time;Place Current Attention Level: Focused Memory: Decreased short-term memory;Decreased recall of precautions Following Commands: Follows one step commands with increased time;Follows multi-step commands inconsistently (attention interfering) Safety/Judgement: Decreased awareness of safety;Decreased awareness of deficits Awareness: Emergent Problem Solving: Slow processing;Requires verbal cues General Comments: requires redirection back to task at hand, continuous vc for sternal precautions with transfers         General Comments General comments (skin integrity, edema, etc.): with activity BP 142/66, RR 22, HR 112bpm, SaO2 on RA difficult to maintain reading with good waveform when able >90%O2, wife present during session       Pertinent Vitals/Pain Faces Pain Scale: Hurts a little bit Pain Location: incision Pain Descriptors / Indicators:  Aching;Grimacing;Guarding  PT Goals (current goals can now be found in the care plan section) Acute Rehab PT Goals PT Goal Formulation: With patient Time For Goal Achievement: 04/12/17 Potential to Achieve Goals: Good Progress towards PT goals: Progressing toward goals    Frequency    Min 3X/week      PT Plan Current plan remains appropriate       AM-PAC PT "6 Clicks" Daily Activity  Outcome Measure  Difficulty turning over in bed (including adjusting bedclothes, sheets and blankets)?: A Lot Difficulty moving from lying on back to sitting on the side of the bed? : A Lot Difficulty sitting down on and standing up from a chair with arms (e.g., wheelchair, bedside commode, etc,.)?: Unable Help needed moving to and from a bed to chair (including a wheelchair)?: A Little Help needed walking in hospital room?: A Little Help needed climbing 3-5 steps with a railing? : Total 6 Click Score: 12    End of Session Equipment Utilized During Treatment: Gait belt Activity Tolerance: Patient tolerated treatment well Patient left: in chair;with call bell/phone within reach;with chair alarm set;with family/visitor present Nurse Communication: Mobility status PT Visit Diagnosis: Unsteadiness on feet (R26.81);Muscle weakness (generalized) (M62.81)     Time: 3832-9191 PT Time Calculation (min) (ACUTE ONLY): 23 min  Charges:  $Gait Training: 23-37 mins                    G Codes:       Merita Hawks B. Migdalia Dk PT, DPT Acute Rehabilitation  [phone] Pager [phone]      [place] 04/08/2017, 12:38 PM

## 2017-04-08 NOTE — Progress Notes (Signed)
Notified Pharmacist, community that neither lumen flushed at this time. Blue endcaps placed and requested she f/u with MD regarding plan of care to either pull line or TPA. Awaiting further order. Fran Lowes, RN VAST

## 2017-04-08 NOTE — Progress Notes (Addendum)
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  Bentall procedure.  Aortic root repleacement with bioprosthetic valve.  Reimplantation of left and right coronary arteries.  Total resection of transverse aortic arch, 8/28 pericardiocentesis    OT comments  Pt continues to require extensive amount of assist to complete ADLs due to severity of cognitive deficits.  He demonstrates focused to brief periods of sustained attention and is frequently distracted preventing him from being able to complete a task.  He required max cues/assist just to problem solve how to pull gown back to expose his penis to use his urinal.  He is unable to use his cell phone, and is not oriented to place, time, or situation - becomes argumentative when corrected.  He will require 24 hour constant supervision.  Recommend NO DRIVING, including not driving of tractors or use of farm equipment, no cooking, no use of lawn equipment or power tools.  Would likely benefit from neuropsych evaluation.   Follow Up Recommendations  Home health OT;Supervision/Assistance - 24 hour    Equipment Recommendations  None recommended by OT    Recommendations for Other Services      Precautions / Restrictions Precautions Precautions: Fall;Sternal Precaution Comments: Pt able to recall one of 3 sternal precautions, but unable to generalize that info to activity  Restrictions Weight Bearing Restrictions: Yes (sternal precautions)       Mobility Bed Mobility               General bed mobility comments: Pt sitting up in chair   Transfers Overall transfer level: Needs assistance Equipment used: Rolling walker (2 wheeled) Transfers: Sit to/from Stand Sit to Stand: Min assist              Balance Overall balance assessment: Needs assistance Sitting-balance  support: No upper extremity supported;Feet supported Sitting balance-[name]: Good     Standing balance support: During functional activity Standing balance-[name]: Poor Standing balance comment: requires RW assist                            ADL either performed or assessed with clinical judgement   ADL Overall ADL's : Needs assistance/impaired                         Toilet Transfer: Minimal assistance   Toileting- Clothing Manipulation and Hygiene: Moderate assistance;Sitting/lateral lean Toileting - Clothing Manipulation Details (indicate cue type and reason): use of urinal      Functional mobility during ADLs: Minimal assistance General ADL Comments: see comments under cognition      Vision       Perception     Praxis      Cognition Arousal/Alertness: Awake/alert Behavior During Therapy: Agitated (intermittently ) Overall Cognitive Status: Impaired/Different from baseline Area of Impairment: Orientation;Attention;Memory;Following commands;Safety/judgement;Awareness;Problem solving                 Orientation Level: Disoriented to;Place;Time;Situation Current Attention Level: Sustained;Focused Memory: Decreased recall of precautions;Decreased short-term memory Following Commands: Follows one step commands consistently;Follows one step commands with increased time Safety/Judgement: Decreased awareness of deficits;Decreased awareness of safety   Problem Solving: Slow processing;Difficulty sequencing;Requires verbal cues;Requires tactile cues;Decreased initiation General Comments: Pt unaware that he is at the hospital states "we just left there".  He thinks therapist rode  with his wife "here".  He is highly distracted requiring max cues to attend to task - he was very distracted by thunder and fact that his wife was not present.  He vebalized urgent need to urinate upon therapist's arrival in room.  Urinal was on table next to him, he  was unable to determine what it was for, or how to use it.  Once instruction provided, pt unable to figure out how to uncover penis from his gown. He required increased time, and trial and error to free his Rt hand from under the blanket around his shoulders.  He was unable to divide his attention between holding his penis in the urinal and holding the urinal requiring mod A to do so.  He was not able to use his cell phone even with max cues         Exercises     Shoulder Instructions       General Comments      Pertinent Vitals/ Pain       Pain Assessment: Faces Faces Pain Scale: Hurts a little bit Pain Location: incision Pain Descriptors / Indicators: Grimacing Pain Intervention(s): Monitored during session  Home Living                                          Prior Functioning/Environment              Frequency  Min 2X/week        Progress Toward Goals  OT Goals(current goals can now be found in the care plan section)  Progress towards OT goals: Not progressing toward goals - comment (severity of cognitive deficits )     Plan Discharge plan remains appropriate    Co-evaluation                 AM-PAC PT "6 Clicks" Daily Activity     Outcome Measure   Help from another person eating meals?: A Little Help from another person taking care of personal grooming?: A Lot Help from another person toileting, which includes using toliet, bedpan, or urinal?: A Lot Help from another person bathing (including washing, rinsing, drying)?: A Lot Help from another person to put on and taking off regular upper body clothing?: A Lot Help from another person to put on and taking off regular lower body clothing?: Total 6 Click Score: 12    End of Session Equipment Utilized During Treatment: Rolling walker  OT Visit Diagnosis: Unsteadiness on feet (R26.81);Cognitive communication deficit (R41.841)   Activity Tolerance Patient tolerated treatment  well   Patient Left in chair;with call bell/phone within reach;with chair alarm set   Nurse Communication Mobility status        Time: 3244-0102 OT Time Calculation (min): 23 min  Charges: OT General Charges $OT Visit: 1 Visit OT Treatments $Self Care/Home Management : 23-37 mins  [facility], OTR/L [phone]    Lucille Passy M 04/08/2017, 5:31 PM

## 2017-04-08 NOTE — Progress Notes (Signed)
Pt received from [place]. Pt oriented to room and equipment. Wife at bedside. Pt VSS. Telemetry applied, CCMD notified. Cardiac rehab in room to walk with patient. Will continue to monitor.   Fritz Pickerel, RN

## 2017-04-08 NOTE — Progress Notes (Signed)
      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  Bentall procedure.  Aortic root repleacement with bioprosthetic valve.  Reimplantation of left and right coronary arteries.  Total resection of transverse aortic arch.  Elephant trunk distal anastomosis and debranching of arch vessels. (N/A) TRANSESOPHAGEAL ECHOCARDIOGRAM (TEE) (N/A)   R1 Day Post-Op Procedure(s) (LRB): PERICARDIOCENTESIS (N/A)  Subjective: Looks good.  Still confused intermittently.  Denies pain, SOB.  Objective: Vital signs: BP Readings from Last 1 Encounters:  04/08/17 102/64   Pulse Readings from Last 1 Encounters:  04/08/17 84   Resp Readings from Last 1 Encounters:  04/08/17 (!) 28   Temp Readings from Last 1 Encounters:  04/08/17 98 F (36.7 C) (Oral)    Hemodynamics:    Physical Exam:  Rhythm:   sinus  Breath sounds: clear  Heart sounds:  RR  Incisions:  Clean and dry  Abdomen:  Soft, non-distended, non-tender  Extremities:  Warm, well-perfused  Pericardial drain: low volume thin serosanguinous output, no air leak    Intake/Output from previous day: 08/29 0701 - 08/30 0700 In: 120 [P.O.:120] Out: 565 [Urine:525; Drains:40] Intake/Output this shift: No intake/output data recorded.  Lab Results:  CBC: Recent Labs  04/08/17 0347  WBC 10.4  HGB 9.2*  HCT 29.2*  PLT 429*    BMET:  Recent Labs  04/08/17 0347  NA 134*  K 4.3  CL 104  CO2 18*  GLUCOSE 99  BUN 19  CREATININE 0.87  CALCIUM 8.2*     PT/INR:  No results for input(s): LABPROT, INR in the last 72 hours.  CBG (last 3)   Recent Labs  04/07/17 1229 04/08/17 0352  GLUCAP 64* 98    ABG    Component Value Date/Time   PHART 7.523 (H) 04/07/2017 1219   PCO2ART 29.3 (L) 04/07/2017 1219   PO2ART 65.0 (L) 04/07/2017 1219   HCO3 24.1  04/07/2017 1219   TCO2 25 04/07/2017 1219   ACIDBASEDEF 2.0 03/25/2017 0742   O2SAT 95.0 04/07/2017 1219    CXR: n/a  Assessment/Plan:  D/C drain Mobilize Transfer tele Anticipate d/c home tomorrow  Rexene Alberts, MD 04/08/2017 8:08 AM

## 2017-04-08 NOTE — Progress Notes (Signed)
Patient resting comfortably on room air. BIPAP is not needed at this time. RT will monitor as needed. 

## 2017-04-08 NOTE — Progress Notes (Signed)
  Echocardiogram 2D Echocardiogram has been performed.  Jennette Dubin 04/08/2017, 10:36 AM

## 2017-04-09 ENCOUNTER — Encounter (HOSPITAL_COMMUNITY): Payer: Self-pay | Admitting: Cardiovascular Disease

## 2017-04-09 DIAGNOSIS — Z48812 Encounter for surgical aftercare following surgery on the circulatory system: Secondary | ICD-10-CM | POA: Diagnosis not present

## 2017-04-09 DIAGNOSIS — Z953 Presence of xenogenic heart valve: Secondary | ICD-10-CM | POA: Diagnosis not present

## 2017-04-09 MED ORDER — POTASSIUM CHLORIDE CRYS ER 20 MEQ PO TBCR: 20.0000 meq | EXTENDED_RELEASE_TABLET | Freq: Every day | ORAL | 1 refills | Status: DC

## 2017-04-09 MED ORDER — METOPROLOL TARTRATE 25 MG PO TABS: 12.5000 mg | ORAL_TABLET | Freq: Two times a day (BID) | ORAL | 1 refills | Status: DC

## 2017-04-09 MED ORDER — FUROSEMIDE 40 MG PO TABS: 40.0000 mg | ORAL_TABLET | Freq: Every day | ORAL | 1 refills | Status: DC

## 2017-04-09 MED ORDER — TRAMADOL HCL 50 MG PO TABS: ORAL_TABLET | ORAL | 0 refills | Status: DC

## 2017-04-09 MED ORDER — FUROSEMIDE 40 MG PO TABS: 40.0000 mg | ORAL_TABLET | Freq: Every day | ORAL | 0 refills | Status: DC | PRN

## 2017-04-09 MED ORDER — ASPIRIN 325 MG PO TBEC: 325.0000 mg | DELAYED_RELEASE_TABLET | Freq: Every day | ORAL | 0 refills | Status: DC

## 2017-04-09 MED ORDER — POTASSIUM CHLORIDE CRYS ER 20 MEQ PO TBCR: 20.0000 meq | EXTENDED_RELEASE_TABLET | Freq: Every day | ORAL | 0 refills | Status: DC | PRN

## 2017-04-09 MED ORDER — NICOTINE 14 MG/24HR TD PT24: 14.0000 mg | MEDICATED_PATCH | Freq: Every day | TRANSDERMAL | 0 refills | Status: DC

## 2017-04-09 MED ORDER — FERROUS SULFATE 325 (65 FE) MG PO TABS: 325.0000 mg | ORAL_TABLET | Freq: Every day | ORAL | 0 refills | Status: DC

## 2017-04-09 NOTE — Progress Notes (Addendum)
[address]            [place] [address]          [phone]   2 Days Post-Op Procedure(s) (LRB): PERICARDIOCENTESIS (N/A)  Total Length of Stay:  LOS: 16 days   Subjective: Patient intermittently pleasantly confused.  Objective: Vital signs in last 24 hours: Temp:  [97.4 F (36.3 C)-98.9 F (37.2 C)] 97.4 F (36.3 C) (08/31 0505) Pulse Rate:  [29-89] 78 (08/31 0505) Cardiac Rhythm: Normal sinus rhythm;Bundle branch block (08/30 1900) Resp:  [18-30] 25 (08/31 0505) BP: (104-142)/(56-82) 114/56 (08/31 0505) SpO2:  [90 %-99 %] 90 % (08/31 0505) Weight:  [171 lb 9.6 oz (77.8 kg)] 171 lb 9.6 oz (77.8 kg) (08/31 0457)  Filed Weights   04/07/17 0411 04/08/17 0400 04/09/17 0457  Weight: 182 lb 3.2 oz (82.6 kg) 184 lb 11.9 oz (83.8 kg) 171 lb 9.6 oz (77.8 kg)   Weight change: -13 lb 2.3 oz (-5.963 kg)      Intake/Output from previous day: 08/30 0701 - 08/31 0700 In: 610 [P.O.:600; I.V.:10] Out: 900 [Urine:900]  Intake/Output this shift: No intake/output data recorded.   Heart: RRR Lungs: Slightly diminished at bases Abdomen: Soft, non tender, bowel sounds present Extremities: No LE edema.  Wounds: Clean and dry  Lab Results: CBC:  Recent Labs  04/08/17 0347  WBC 10.4  HGB 9.2*  HCT 29.2*  PLT 429*   BMET:   Recent Labs  04/08/17 0347  NA 134*  K 4.3  CL 104  CO2 18*  GLUCOSE 99  BUN 19  CREATININE 0.87  CALCIUM 8.2*    CMET: Lab Results  Component Value Date   WBC 10.4 04/08/2017   HGB 9.2 (L) 04/08/2017   HCT 29.2 (L) 04/08/2017   PLT 429 (H) 04/08/2017   GLUCOSE 99 04/08/2017   CHOL 162 10/05/2016   TRIG 105.0 10/05/2016   HDL 43.80 10/05/2016   LDLDIRECT 137.9 04/24/2013   LDLCALC 97 10/05/2016   ALT 42 04/08/2017   AST 26 04/08/2017   NA 134 (L) 04/08/2017   K 4.3 04/08/2017   CL 104 04/08/2017   CREATININE 0.87 04/08/2017   BUN 19 04/08/2017   CO2 18 (L) 04/08/2017   TSH 1.00  03/12/2016   PSA 7.18 (H) 01/10/2016   INR 1.09 03/24/2017   HGBA1C 5.5 04/04/2017    PT/INR: No results for input(s): LABPROT, INR in the last 72 hours. Radiology: No results found.  Assessment/Plan: S/P Procedure(s) (LRB): PERICARDIOCENTESIS (N/A)  1. CV-SR in the 70's this am. On Flecainide 100 mg bid and Lopressor 12.5 mg bid.Echo yesterday showed LVEF 50-55%, no significant valvular disease, and a small to moderate pericardial effusion (anterior to the heart). 2. Pulmonary-Patient with long history of tobacco abuse and is a mouth breather. Previously on higt flow oxygen now weaned to room air. Continue Duonebs. Encourage incentive spirometer and flutter valve.  3. Volume overload-on Lasix 40 mg PO daily. As discussed with Dr. Roxy Manns, will make PRN.  4. ABL anemia-Last H and H stable at 9.2 and 29.2 Continue Foltx. 5. Continue PT/OT-I ordered home PT/OT as recommended 6. Patient has had intermittent confusion post op. Likely related to surgery and prolonged hospitalization. Hopefully, will improve with time. 7. Discharge later today  Nani Skillern PA-C 04/09/2017 7:21 AM

## 2017-04-09 NOTE — Discharge Instructions (Signed)
Please weight daily and record weight. If patient's weight continues to increase or he develops lower extremity edema, he is to take Lasix 40 mg daily with potassium supplement.  Aortic Valve Replacement, Care After Refer to this sheet in the next few weeks. These instructions provide you with information about caring for yourself after your procedure. Your health care provider may also give you more specific instructions. Your treatment has been planned according to current medical practices, but problems sometimes occur. Call your health care provider if you have any problems or questions after your procedure. What can I expect after the procedure? After the procedure, it is common to have:  Pain around your incision area.  A small amount of blood or clear fluid coming from your incision.  Follow these instructions at home: Eating and drinking   Follow instructions from your health care provider about eating or drinking restrictions. ? Limit alcohol intake to no more than 1 drink per day for nonpregnant women and 2 drinks per day for men. One drink equals 12 oz of beer, 5 oz of wine, or 1 oz of hard liquor. ? Limit how much caffeine you drink. Caffeine can affect your heart's rate and rhythm.  Drink enough fluid to keep your urine clear or pale yellow.  Eat a heart-healthy diet. This should include plenty of fresh fruits and vegetables. If you eat meat, it should be lean cuts. Avoid foods that are: ? High in salt, saturated fat, or sugar. ? Canned or highly processed. ? Fried. Activity  Return to your normal activities as told by your health care provider. Ask your health care provider what activities are safe for you.  Exercise regularly once you have recovered, as told by your health care provider.  Avoid sitting for more than 2 hours at a time without moving. Get up and move around at least once every 1-2 hours. This helps to prevent blood clots in the legs.  Do not lift  anything that is heavier than 10 lb (4.5 kg) until your health care provider approves.  Avoid pushing or pulling things with your arms until your health care provider approves. This includes pulling on handrails to help you climb stairs. Incision care   Follow instructions from your health care provider about how to take care of your incision. Make sure you: ? Wash your hands with soap and water before you change your bandage (dressing). If soap and water are not available, use hand sanitizer. ? Change your dressing as told by your health care provider. ? Leave stitches (sutures), skin glue, or adhesive strips in place. These skin closures may need to stay in place for 2 weeks or longer. If adhesive strip edges start to loosen and curl up, you may trim the loose edges. Do not remove adhesive strips completely unless your health care provider tells you to do that.  Check your incision area every day for signs of infection. Check for: ? More redness, swelling, or pain. ? More fluid or blood. ? Warmth. ? Pus or a bad smell. Medicines  Take over-the-counter and prescription medicines only as told by your health care provider.  If you were prescribed an antibiotic medicine, take it as told by your health care provider. Do not stop taking the antibiotic even if you start to feel better. Travel  Avoid airplane travel for as long as told by your health care provider.  When you travel, bring a list of your medicines and a record of your  medical history with you. Carry your medicines with you. Driving  Ask your health care provider when it is safe for you to drive. Do not drive until your health care provider approves.  Do not drive or operate heavy machinery while taking prescription pain medicine. Lifestyle   Do not use any tobacco products, such as cigarettes, chewing tobacco, or e-cigarettes. If you need help quitting, ask your health care provider.  Resume sexual activity as told by  your health care provider. Do not use medicines for erectile dysfunction unless your health care provider approves, if this applies.  Work with your health care provider to keep your blood pressure and cholesterol under control, and to manage any other heart conditions that you have.  Maintain a healthy weight. General instructions  Do not take baths, swim, or use a hot tub until your health care provider approves.  Do not strain to have a bowel movement.  Avoid crossing your legs while sitting down.  Check your temperature every day for a fever. A fever may be a sign of infection.  If you are a woman and you plan to become pregnant, talk with your health care provider before you become pregnant.  Wear compression stockings if your health care provider instructs you to do this. These stockings help to prevent blood clots and reduce swelling in your legs.  Tell all health care providers who care for you that you have an artificial (prosthetic) aortic valve. If you have or have had heart disease or endocarditis, tell all health care providers about these conditions as well.  Keep all follow-up visits as told by your health care provider. This is important. Contact a health care provider if:  You develop a skin rash.  You experience sudden, unexplained changes in your weight.  You have more redness, swelling, or pain around your incision.  You have more fluid or blood coming from your incision.  Your incision feels warm to the touch.  You have pus or a bad smell coming from your incision.  You have a fever. Get help right away if:  You develop chest pain that is different from the pain coming from your incision.  You develop shortness of breath or difficulty breathing.  You start to feel light-headed. These symptoms may represent a serious problem that is an emergency. Do not wait to see if the symptoms will go away. Get medical help right away. Call your local emergency  services (911 in the [place] Do not drive yourself to the hospital. This information is not intended to replace advice given to you by your health care provider. Make sure you discuss any questions you have with your health care provider. Document Released: 02/12/2005 Document Revised: 01/02/2016 Document Reviewed: 06/30/2015 Elsevier Interactive Patient Education  2017 [employer].

## 2017-04-09 NOTE — Consult Note (Signed)
   [facility] CM Inpatient Consult   04/09/2017  Sean Riley [birth year] [id number]   Follow up with patient.  He verbally consented to the Home First program with [facility]. Spoke with the patient and he states he is pleased with the program and feels that his needs will be met.  For questions, please contact:  Natividad Brood, RN BSN [facility] Liaison  [phone] business mobile phone Toll free office [phone]

## 2017-04-09 NOTE — Care Management Note (Signed)
Case Management Note Marvetta Gibbons RN, BSN Unit 4E-Case Manager-- [place] coverage [phone]  Patient Details  Name: Sean Riley MRN: [id number] Date of Birth: 03/13/1951  Subjective/Objective:   Pt admitted with acute type A aortic dissection - to OR for emergent repair now s/p REPAIR OF ACUTE ASCENDING THORACIC AORTIC DISSECTION.  Bentall procedure 8/15                 Action/Plan: PTA pt lived at home with wife-independent,  CM to follow for d/c needs  Expected Discharge Date:  04/09/17               Expected Discharge Plan:  [place]  In-House Referral:     Discharge planning Services  CM Consult  Post Acute Care Choice:  Durable Medical Equipment, Home Health Choice offered to:  Spouse  DME Arranged:  Gilford Rile rolling DME Agency:  [place] Arranged:  RN, PT, OT [facility] Agency:  [place]  Status of Service:  Completed, signed off  If discussed at [place] of Stay Meetings, dates discussed:  8/23, 8/28, 8/30  Discharge Disposition: home/home health   Additional Comments:  04/08/17- 1010- Wendee Hata RN, CM- pt for d/c home today, s/p pericardiocentesis 8/30- [place] services have been arranged with [name]- notified Tommi Rumps with [facility] of discharge will add pt to [facility] list per [facility] due to Home First program. Notified karen with [facility] for DME- RW- to be delivered to room prior to discharge.   04/06/17- 1030- Iolani Twilley RN, CM- orders placed for HHRN/PT/OT- spoke with wife at bedside- choice offered for [facility] services- per wife she does not have a preference- ok with [name] for [facility]- referral called to [facility] with [name] and referral accepted for services- per wife pt will also need RW for home- notified [name] with [facility] for DME needs- RW to be delivered to room prior to discharge.  Pt will also most likely need home 02 and will need orders for this to arrange for home.   04/01/17- 1525- Kelia Gibbon RN, CM- pt continues to  need HF 02- vol. Overload- recommendation for [facility]- if progresses to going home will need [place] orders for discharge-   [name], RN 04/09/2017, 10:13 AM

## 2017-04-09 NOTE — Progress Notes (Signed)
Pt discharged home with wife. Telemetry box removed, per order. Pt and pt's wife received discharge instructions and all questions were answered. Pt received paper prescriptions and was instructed to get them filled at his pharmacy. Pt and pt's wife verbalized understanding. Pt received rolling walker prior to discharge. Home health has been arranged. Pt discharged via wheelchair and was accompanied by pt's wife and a Actor. Pt left with all of his belongings.  Grant Fontana BSN, RN

## 2017-04-09 NOTE — Care Management Important Message (Signed)
Important Message  Patient Details  Name: Sean Riley MRN: [id number] Date of Birth: June 20, 1951   Medicare Important Message Given:  Yes    Nathen May 04/09/2017, 9:29 AM

## 2017-04-09 NOTE — Progress Notes (Signed)
[id number] Education completed with pt and wife. Pt will need reinforcement of education by wife. Encouraged IS and flutter valve. Discussed sternal precautions, ex ed, watching sodium, daily weights, heart healthy diet, and smoking cessation. Gave pt fake cigarette and smoking cessation handout. Using nicotine patches here. Discussed CRP 2 and will refer to [facility] program. Did not want to watch discharge video as pt wants to leave ASAP.  Pt to get rolling walker and has Home PT/OT. Graylon Good RN BSN 04/09/2017 10:09 AM

## 2017-04-10 DIAGNOSIS — Z48812 Encounter for surgical aftercare following surgery on the circulatory system: Secondary | ICD-10-CM | POA: Diagnosis not present

## 2017-04-10 DIAGNOSIS — Z953 Presence of xenogenic heart valve: Secondary | ICD-10-CM | POA: Diagnosis not present

## 2017-04-12 LAB — CULTURE, BODY FLUID-BOTTLE: CULTURE: NO GROWTH

## 2017-04-12 LAB — CULTURE, BODY FLUID W GRAM STAIN -BOTTLE

## 2017-04-12 NOTE — Progress Notes (Signed)
Cardiology Office Note Date:  04/14/2017  Patient ID:  Sean, Riley [birth year], MRN [id number] PCP:  Colon Branch, MD  Electrophysiologist; Sean Riley  Chief Complaint: post hospital visit, d/c 04/09/17  History of Present Illness: Sean Riley is a 66 y.o. male with history of HTN, COPD w/adult lifelong heavy smoking, and AFib maintained on flecainide and diltiazem, Riley in today to be seen for Sean Riley, last seen by him in Dec 2017, at that time doing well without any symptoms of AF episodes, BP controlled, still smoking, no changes to his tx were made.   Most recently unfortunately admitted to [facility] with acute aortic dissection undergoing on 03/24/17  Emergency Repair of Aortic Dissection              Right axillary artery cannulation             Deep hypothermia and total circulatory arrest with continuous antegrade cerebral perfusion             Resection and grafting of ascending aorta, transverse aortic arch and proximal descending thoracic aorta             Elephant trunk distal anastomosis             Debranching of aortic arch vessles            Biological Bentall Aortic Root Replacement             Edwards Magna Ease Pericardial Tissue Valve (size 25 mm, model # 3300TFX, serial # [id number])             Gelweave Valsalva Aortic Root Graft (size 28 mm, ref # [id number], serial # [id number])             Reimplantation of Left Main and Right Coronary Arteries  Hospitalization noted expected anemia, covered empirically for pneumonia given COPD, ARI, pericardial effusion w/o tamponade had pericardiocentesis 04/07/17, intermittent confusion, PAF though discharged in SR, discharged 04/09/17 on is flecainide and BB, not on a/c.  He Riley accompanied by his wife/daughter.  He remains very weak, tired, just discharged Friday.  He has a home visiting RN, and this week home PT and OT will start as well.  Since home he has gained weight and RN noted swelling ankles.  His discharge weight  reported 173, Saturday was 182, they were instructed to take the lasix until seen here (so has taken the last 3 days not yet today), with weight down to 178 today and reduction in swelling though not resolved.  He also describes feeling SOB when laying down in bed, having to sleep in the recliner the last couple days.  No palpitations, no near syncope or syncope. He reports hands/fingers b/l have felt tingly/numb since his surgery.  Past Medical History:  Diagnosis Date  . Allergy   . Arthritis   . Ascending aortic dissection ([place]) 03/23/2017  . Atrial fibrillation ([place])   . Cataract    removed both eyes   . GERD (gastroesophageal reflux disease)   . HYPERLIPIDEMIA   . 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 Hypertension   . Microscopic hematuria    negative cystoscopy  . NONSPECIFIC ABNORMAL ELECTROCARDIOGRAM   . S/P aortic dissection repair 03/24/2017   Biological Bentall aortic root replacement + resection and grafting of entire ascending aorta, transverse aortic arch and proximal descending thoracic aorta with elephant trunk distal anastomosis and debranching of aortic arch vessels  . 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 Varicose veins     Past Surgical History:  Procedure Laterality Date  . APPENDECTOMY    . CARDIAC CATHETERIZATION      X3;Dr [name], last in July, 2013  . COLONOSCOPY    . CORONARY ANGIOPLASTY     04/24/13 : he denies angioplasty  . CYSTOSCOPY  1978   Dr Hartley Barefoot  . KNEE ARTHROSCOPY  2012   Dr Theda Sers  . LUMBAR LAMINECTOMY  1987   Dr Durward Fortes  . MYELOGRAM  2007  . PERICARDIOCENTESIS N/A 04/07/2017   Procedure: PERICARDIOCENTESIS;  Surgeon: Sherren Mocha, MD;  Location: [place] [facility];  Service: Cardiovascular;  Laterality: N/A;  .  POLYPECTOMY    . REPAIR OF ACUTE ASCENDING THORACIC AORTIC DISSECTION N/A 03/23/2017   Procedure: REPAIR OF ACUTE ASCENDING THORACIC AORTIC DISSECTION.  Bentall procedure.  Aortic root repleacement with bioprosthetic valve.  Reimplantation of left and right coronary arteries.  Total resection of transverse aortic arch.  Elephant trunk distal anastomosis and debranching of arch vessels.;  Surgeon: Rexene Alberts, MD;  Location: [place];  Service: Vascular;  Laterality: N/A;  . ROTATOR CUFF REPAIR    . SHOULDER ARTHROSCOPY  2011   Dr Theda Sers  . SHOULDER ARTHROSCOPY Right 08/2015  . SPINAL FUSION  1986   Dr Rolin Barry  . TEE WITHOUT CARDIOVERSION N/A 03/23/2017   Procedure: TRANSESOPHAGEAL ECHOCARDIOGRAM (TEE);  Surgeon: Rexene Alberts, MD;  Location: [place];  Service: Open Heart Surgery;  Laterality: N/A;  . TRACHEOSTOMY     age 44 for croup    Current Outpatient Prescriptions  Medication Sig Dispense Refill  . albuterol (PROAIR HFA) 108 (90 Base) MCG/ACT inhaler Inhale two puffs every four to six hours as needed for cough or wheeze. (Patient taking differently: Inhale 2 puffs into the lungs See admin instructions. Every 4-6 hours as needed for coughing or wheezing) 3 Inhaler 0  . aspirin EC 325 MG EC tablet Take 1 tablet (325 mg total) by mouth daily. 30 tablet 0  . azelastine (ASTELIN) 0.1 % nasal spray Can use two sprays in each nostril twice daily as directed. (Patient taking differently: Place 2 sprays into both nostrils 2 (two) times daily. ) 90 mL 3  . calcipotriene-betamethasone (TACLONEX) ointment Apply 1 application topically daily as needed (for eczema).     . esomeprazole (NEXIUM) 40 MG capsule Take 1 capsule (40 mg total) by mouth daily before breakfast. 90 capsule 3  . ferrous sulfate 325 (65 FE) MG tablet Take 1 tablet (325 mg total) by mouth daily. For one month then stop. 30 tablet 0  . flecainide (TAMBOCOR) 100 MG tablet Take 1 tablet (100 mg total) by mouth 2 (two) times daily. 12  tablet 0  . fluticasone (FLONASE) 50 MCG/ACT nasal spray Use one spray in each nostril twice daily. 48 g 3  . furosemide (LASIX) 40 MG tablet Take 1 tablet (40 mg total) by mouth daily as needed. 30 tablet 0  . ipratropium (ATROVENT) 0.06 % nasal spray Can use two sprays in each nostril every six hours as needed to dry up nose. (Patient taking differently: Place 2 sprays into both nostrils every 6 (six) hours as needed for rhinitis. ) 45 mL 3  . metoprolol tartrate (LOPRESSOR) 25 MG tablet Take 0.5 tablets (  12.5 mg total) by mouth 2 (two) times daily. 30 tablet 1  . nicotine (NICODERM CQ - DOSED IN MG/24 HOURS) 14 mg/24hr patch Place 1 patch (14 mg total) onto the skin daily. 28 patch 0  . potassium chloride SA (K-DUR,KLOR-CON) 20 MEQ tablet Take 1 tablet (20 mEq total) by mouth daily as needed. 30 tablet 0  . rosuvastatin (CRESTOR) 5 MG tablet Take 5 mg by mouth every other day.    . traMADol (ULTRAM) 50 MG tablet Take 50 mg by mouth every 4-6 hours PRN severe pain. 28 tablet 0   No current facility-administered medications for this visit.     Allergies:   Simvastatin; Celecoxib; Codeine; Nsaids; Tape; Cephalexin; and Latex   Social History:  The patient  reports that he has been smoking Cigarettes.  He has a 45.00 pack-year smoking history. He has never used smokeless tobacco. He reports that he drinks alcohol. He reports that he does not use drugs.   Family History:  The patient's family history includes Arrhythmia in his brother; Benign prostatic hyperplasia in his father; Breast cancer in his maternal grandmother; Heart attack in his maternal aunt and maternal grandmother; Hypertension in his father and mother.  ROS:  Please see the history of present illness. All other systems are reviewed and otherwise negative.   PHYSICAL EXAM:  VS:  BP 131/77   Pulse 68   Ht 6\' 2"  (1.88 m)   Wt 178 lb (80.7 kg)   BMI 22.85 kg/m  BMI: Body mass index is 22.85 kg/m. Well nourished, well  developed, in no acute distress  HEENT: normocephalic, atraumatic  Neck: no JVD, carotid bruits or masses Cardiac: RRR; 1-2/6SM, no rubs, or gallops, heart sounds are easily heard, not distant Lungs:  Slightly diminished at the bases, no crackles, no wheezing, rhonchi or rales  Abd: soft, nontender MS: no deformity or atrophy Ext:  Trace-1+ ankle edema Skin: warm and dry, no rash Neuro:  No gross deficits appreciated Psych: euthymic mood, full affect  Thoracotomy?drain sites appear to be healing well, smals scabs remain, no erythema, no drainage.    EKG:  Done today and reviewed by myself shows SR, RBBB (known), PR 173ms, QRS 143ms, QTc 597 w.RBBB  04/08/17: limited echo (post-pericardiocentesis) Study Conclusions - Left ventricle: The cavity size was normal. There was mild   hypertrophy of the septum with otherwise mild concentric   hypertrophy. Systolic function was normal. The estimated ejection   fraction was in the range of 50% to 55%. Wall motion was normal;   there were no regional wall motion abnormalities. - Aortic valve: Transvalvular velocity was within the normal range.   There was no stenosis. There was no regurgitation. - Mitral valve: Transvalvular velocity was within the normal range.   There was no evidence for stenosis. There was no regurgitation. - Left atrium: The atrium was moderately dilated. - Right ventricle: The cavity size was normal. Wall thickness was   normal. Systolic function was normal. - Right atrium: The atrium was moderately dilated. - Tricuspid valve: There was no regurgitation. - Pericardium, extracardiac: A small to moderate pericardial   effusion was identified anterior to the heart. Measures up to   1.6cm. There was no evidence of hemodynamic compromise. There was   a left pleural effusion.  04/06/17: Limited echo (pre-pericardiocentesis) Study Conclusions - Left ventricle: The cavity size was normal. Wall thickness was   normal. Systolic  function was mildly to moderately reduced. The   estimated ejection fraction  was in the range of 40% to 45%.   Diffuse hypokinesis. Doppler parameters are consistent with   abnormal left ventricular relaxation (grade 1 diastolic   dysfunction). - Aortic valve: A bioprosthesis was present and functioning   normally. - Pericardium, extracardiac: A moderate to large pericardial   effusion was identified circumferential to the heart. There was   no evidence of hemodynamic compromise. Impressions: - No significant change in pericardial effusion when compared to   prior study.   07/25/15: lexiscan stress Study Highlights   The left ventricular ejection fraction is mildly decreased (45-54%).  Nuclear stress EF: 48%.  There was no ST segment deviation noted during stress.  This is an intermediate risk study.   There is no ischemia.  There is diaphragmatic attenuation similar to prior study of 12/29/11. Since the prior study of 12/29/11, the EF has decreased from 58% to 48%.  The current study does not reveal any segmental wall motion abnormalities.     Recent Labs: 03/25/2017: Magnesium 2.5 04/08/2017: ALT 42; BUN 19; Creatinine, Ser 0.87; Hemoglobin 9.2; Platelets 429; Potassium 4.3; Sodium 134  10/05/2016: Cholesterol 162; HDL 43.80; LDL Cholesterol 97; Total CHOL/HDL Ratio 4; Triglycerides 105.0; VLDL 21.0   Estimated Creatinine Clearance: 94 mL/min (by C-G formula based on SCr of 0.87 mg/dL).   Wt Readings from Last 3 Encounters:  04/14/17 178 lb (80.7 kg)  04/09/17 171 lb 9.6 oz (77.8 kg)  10/05/16 192 lb 2 oz (87.1 kg)     Other studies reviewed: Additional studies/records reviewed today include: summarized above  ASSESSMENT AND PLAN:  1. Paroxysmal AFib     CHA2DS2Vasc is at least 2, not on ac/  In review, historically Der. Klein's notes remark the patient had been reluctant about a/c was maintaining SR at [facility] without symptoms of AF  He has post-op PAFib, SR at  discharge and today Will need to revisit once we establish stability of pericardial effusion 1 week event monitor  2. S/p acute type A aortic dissection     Surgical intervention 03/24/17, discharged 04/09/17 3. Pericardia effusion requiring pericardiocentesis, 04/07/17      Limited echo in 2 weeks  4. HTN     Looks OK  5. Fluid OL     Complete 5 days of daily lasix, and then use Lasix/K+ PRN for edema, weight gain.  Discussed that if after 5 days he remains SOB when laying down and/or weight is not to his his discharge weight to continue the lasix/K+ and let us know for labs and further guidance   Disposition: case was discussed with DOD, Dr. Acie Fredrickson, plan as noted above, to f/u with SeanKlein in 3-4 weeks, sooner if needed.  Current medicines are reviewed at length with the patient today.  The patient did not have any concerns regarding medicines.  Sean Lasso, Sean Riley 04/14/2017 12:11 PM     [place] [address] [phone] (office)  [phone] (fax)

## 2017-04-13 ENCOUNTER — Telehealth: Payer: Self-pay | Admitting: Physician Assistant

## 2017-04-13 DIAGNOSIS — Z953 Presence of xenogenic heart valve: Secondary | ICD-10-CM | POA: Diagnosis not present

## 2017-04-13 DIAGNOSIS — Z48812 Encounter for surgical aftercare following surgery on the circulatory system: Secondary | ICD-10-CM | POA: Diagnosis not present

## 2017-04-13 NOTE — Telephone Encounter (Signed)
Patient is s/p aortic dissection 03/23/17- surgeon  He is scheduled to follow up with Tommye Standard, PA tomorrow.  Will await follow up appointment.

## 2017-04-13 NOTE — Telephone Encounter (Signed)
[name]) is calling because [name] weight was 180 on yesterday and 179 today . His target weight is set at 175 .  Please call if there are any additional orders .   Thanks

## 2017-04-14 ENCOUNTER — Ambulatory Visit (INDEPENDENT_AMBULATORY_CARE_PROVIDER_SITE_OTHER): Payer: Medicare Other | Admitting: Physician Assistant

## 2017-04-14 VITALS — BP 131/77 | HR 68 | Ht 74.0 in | Wt 178.0 lb

## 2017-04-14 DIAGNOSIS — I7103 Dissection of thoracoabdominal aorta: Secondary | ICD-10-CM

## 2017-04-14 DIAGNOSIS — I10 Essential (primary) hypertension: Secondary | ICD-10-CM | POA: Diagnosis not present

## 2017-04-14 DIAGNOSIS — I313 Pericardial effusion (noninflammatory): Secondary | ICD-10-CM

## 2017-04-14 DIAGNOSIS — I5030 Unspecified diastolic (congestive) heart failure: Secondary | ICD-10-CM

## 2017-04-14 DIAGNOSIS — I480 Paroxysmal atrial fibrillation: Secondary | ICD-10-CM | POA: Diagnosis not present

## 2017-04-14 DIAGNOSIS — I3139 Other pericardial effusion (noninflammatory): Secondary | ICD-10-CM

## 2017-04-14 NOTE — Patient Instructions (Signed)
Medication Instructions:   TAKE LASIX 40 MG ONCE A DAY FOR 5 DAYS   THEN  START TAKING LASIX 40 MG AS NEEDED FOR WEIGHT GAIN /SWELLING  (PLEASE CONTACT OFFICE IF LASIX HAS TO BE USED REGULARY)  If you need a refill on your cardiac medications before your next appointment, please call your pharmacy.  Labwork: RETURN FOR BMET IN 2 WEEKS    Testing/Procedures:  IN 2 WEEKS Your physician has requested that you have an echocardiogram. Echocardiography is a painless test that uses sound waves to create images of your heart. It provides your doctor with information about the size and shape of your heart and how well your heart's chambers and valves are working. This procedure takes approximately one hour. There are no restrictions for this procedure.   IN ONE WEEK Your physician has recommended that you wear an event monitor. Event monitors are medical devices that record the heart's electrical activity. Doctors most often [name] these monitors to diagnose arrhythmias. Arrhythmias are problems with the speed or rhythm of the heartbeat. The monitor is a small, portable device. You can wear one while you do your normal daily activities. This is usually used to diagnose what is causing palpitations/syncope (passing out).    Follow-Up: IN  3 TO 4 WEEKS WITH DR Caryl Comes ONLY ( MESSAGED NURSE IF APPT. UNAVAILABLE )   Any Other Special Instructions Will Be Listed Below (If Applicable).

## 2017-04-15 DIAGNOSIS — Z48812 Encounter for surgical aftercare following surgery on the circulatory system: Secondary | ICD-10-CM | POA: Diagnosis not present

## 2017-04-15 DIAGNOSIS — Z953 Presence of xenogenic heart valve: Secondary | ICD-10-CM | POA: Diagnosis not present

## 2017-04-16 ENCOUNTER — Encounter: Payer: Self-pay | Admitting: Internal Medicine

## 2017-04-16 ENCOUNTER — Ambulatory Visit (HOSPITAL_BASED_OUTPATIENT_CLINIC_OR_DEPARTMENT_OTHER)
Admission: RE | Admit: 2017-04-16 | Discharge: 2017-04-16 | Disposition: A | Discharge status: Home / Self Care | Payer: Medicare Other | Source: Ambulatory Visit | Attending: Internal Medicine | Admitting: Internal Medicine

## 2017-04-16 ENCOUNTER — Ambulatory Visit (INDEPENDENT_AMBULATORY_CARE_PROVIDER_SITE_OTHER): Payer: Medicare Other | Admitting: Internal Medicine

## 2017-04-16 ENCOUNTER — Telehealth (HOSPITAL_COMMUNITY): Payer: Self-pay

## 2017-04-16 VITALS — BP 128/70 | HR 72 | Temp 98.2°F | Resp 14 | Ht 74.0 in | Wt 183.0 lb

## 2017-04-16 DIAGNOSIS — Z9889 Other specified postprocedural states: Secondary | ICD-10-CM

## 2017-04-16 DIAGNOSIS — R0602 Shortness of breath: Secondary | ICD-10-CM | POA: Insufficient documentation

## 2017-04-16 DIAGNOSIS — J9811 Atelectasis: Secondary | ICD-10-CM | POA: Diagnosis not present

## 2017-04-16 DIAGNOSIS — J90 Pleural effusion, not elsewhere classified: Secondary | ICD-10-CM | POA: Insufficient documentation

## 2017-04-16 DIAGNOSIS — Z48812 Encounter for surgical aftercare following surgery on the circulatory system: Secondary | ICD-10-CM | POA: Diagnosis not present

## 2017-04-16 DIAGNOSIS — F329 Major depressive disorder, single episode, unspecified: Secondary | ICD-10-CM | POA: Diagnosis not present

## 2017-04-16 DIAGNOSIS — F17200 Nicotine dependence, unspecified, uncomplicated: Secondary | ICD-10-CM

## 2017-04-16 DIAGNOSIS — Z952 Presence of prosthetic heart valve: Secondary | ICD-10-CM | POA: Insufficient documentation

## 2017-04-16 DIAGNOSIS — Z953 Presence of xenogenic heart valve: Secondary | ICD-10-CM

## 2017-04-16 DIAGNOSIS — F32A Depression, unspecified: Secondary | ICD-10-CM

## 2017-04-16 LAB — COMPREHENSIVE METABOLIC PANEL
ALBUMIN: 3.2 g/dL — AB (ref 3.5–5.2)
ALT: 16 U/L (ref 0–53)
AST: 16 U/L (ref 0–37)
Alkaline Phosphatase: 99 U/L (ref 39–117)
BUN: 7 mg/dL (ref 6–23)
CALCIUM: 8.9 mg/dL (ref 8.4–10.5)
CHLORIDE: 98 meq/L (ref 96–112)
CO2: 27 mEq/L (ref 19–32)
Creatinine, Ser: 0.94 mg/dL (ref 0.40–1.50)
GFR: 84.98 mL/min (ref >60.00)
Glucose, Bld: 98 mg/dL (ref 70–99)
POTASSIUM: 4.1 meq/L (ref 3.5–5.1)
SODIUM: 132 meq/L — AB (ref 135–145)
Total Bilirubin: 0.6 mg/dL (ref 0.2–1.2)
Total Protein: 6.7 g/dL (ref 6.0–8.3)

## 2017-04-16 LAB — CBC WITH DIFFERENTIAL/PLATELET
BASOS PCT: 0.8 % (ref 0.0–3.0)
Basophils Absolute: 0.1 10*3/uL (ref 0.0–0.1)
EOS PCT: 4.6 % (ref 0.0–5.0)
Eosinophils Absolute: 0.3 10*3/uL (ref 0.0–0.7)
HCT: 31.2 % — ABNORMAL LOW (ref 39.0–52.0)
HEMOGLOBIN: 10.3 g/dL — AB (ref 13.0–17.0)
LYMPHS PCT: 13.5 % (ref 12.0–46.0)
Lymphs Abs: 1 10*3/uL (ref 0.7–4.0)
MCHC: 33 g/dL (ref 30.0–36.0)
MCV: 90.1 fl (ref 78.0–100.0)
Monocytes Absolute: 0.6 10*3/uL (ref 0.1–1.0)
Monocytes Relative: 8.3 % (ref 3.0–12.0)
Neutro Abs: 5.5 10*3/uL (ref 1.4–7.7)
Neutrophils Relative %: 72.8 % (ref 43.0–77.0)
Platelets: 429 10*3/uL — ABNORMAL HIGH (ref 150.0–400.0)
RBC: 3.46 Mil/uL — ABNORMAL LOW (ref 4.22–5.81)
RDW: 15.2 % (ref 11.5–15.5)
WBC: 7.6 10*3/uL (ref 4.0–10.5)

## 2017-04-16 MED ORDER — ALBUTEROL SULFATE (2.5 MG/3ML) 0.083% IN NEBU: 2.5000 mg | INHALATION_SOLUTION | Freq: Three times a day (TID) | RESPIRATORY_TRACT | 1 refills | Status: DC | PRN

## 2017-04-16 MED ORDER — ESCITALOPRAM OXALATE 5 MG PO TABS: 5.0000 mg | ORAL_TABLET | Freq: Every day | ORAL | 1 refills | Status: DC

## 2017-04-16 MED ORDER — ALPRAZOLAM 0.5 MG PO TABS: 0.2500 mg | ORAL_TABLET | Freq: Every evening | ORAL | 0 refills | Status: DC | PRN

## 2017-04-16 NOTE — Progress Notes (Signed)
Subjective:    Patient ID: Sean Riley, male    DOB: [birth year], 66 y.o.   MRN: [id number]  DOS:  04/16/2017 Type of visit - description : Hospital follow-up, no TCM Interval history: Admitted to the hospital  03-23-17, discharge about 2 weeks later. Presented to the ER with severe chest pain, was diagnosed with type A aortic dissection, was promptly taken to the OR,  was extubated  03/25/2017 Did have an acute kidney injury, ATN postop. He had cough and treated empirically with antibiotics. He had postoperative atrial fibrillation, intolerant to Cardizem due to itching. Eventually went back in sinus rhythm. Had a pericardial effusion without tamponade, had a pericardiocentesis 04/07/2017  Review of Systems  Here with his wife and son. Since he left the hospital he is at home and slowly recovering. Family and the patient noticed that he is depressed and has not been able to sleep. Patient is frustrated because he can't do things the way he used to before surgery. Even before the surgery he developed some right hand motor deficit. Denies neck pain. Feels occasional dizziness Occasionally gets short of breath, episodes are not associated with chest pain or palpitations. Albuterol inhaler did not help but he feels that the nebulization at the hospital helped better.  Denies slurred speech, motor deficits other than the R grip. Mental status is okay according to the family. No nausea, vomiting, diarrhea. No blood in the stools. No constipation Occasional cough with clear sputum.    Past Medical History:  Diagnosis Date  . Allergy   . Arthritis   . Ascending aortic dissection ([place]) 03/23/2017  . Atrial fibrillation ([place])   . Cataract    removed both eyes   . GERD (gastroesophageal reflux disease)   . HYPERLIPIDEMIA   . 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 Hypertension   . Microscopic hematuria    negative cystoscopy  . NONSPECIFIC ABNORMAL ELECTROCARDIOGRAM     . S/P aortic dissection repair 03/24/2017   Biological Bentall aortic root replacement + resection and grafting of entire ascending aorta, transverse aortic arch and proximal descending thoracic aorta with elephant trunk distal anastomosis and debranching of aortic arch vessels  . 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 Varicose veins     Past Surgical History:  Procedure Laterality Date  . APPENDECTOMY    . CARDIAC CATHETERIZATION      X3;Dr Caryl Comes, last in July, 2013  . COLONOSCOPY    . CORONARY ANGIOPLASTY     04/24/13 : he denies angioplasty  . CYSTOSCOPY  1978   Dr Hartley Barefoot  . KNEE ARTHROSCOPY  2012   Dr Theda Sers  . LUMBAR LAMINECTOMY  1987   Dr Durward Fortes  . MYELOGRAM  2007  . PERICARDIOCENTESIS N/A 04/07/2017   Procedure: PERICARDIOCENTESIS;  Surgeon: Sherren Mocha, MD;  Location: [place] [facility];  Service: Cardiovascular;  Laterality: N/A;  . POLYPECTOMY    . REPAIR OF ACUTE ASCENDING THORACIC AORTIC DISSECTION N/A 03/23/2017   Procedure: REPAIR OF ACUTE ASCENDING THORACIC AORTIC DISSECTION.  Bentall procedure.  Aortic root repleacement with bioprosthetic valve.  Reimplantation of left and right coronary arteries.  Total resection of transverse aortic arch.  Elephant trunk distal anastomosis and debranching of arch vessels.;  Surgeon: Rexene Alberts, MD;  Location:  [facility] OR;  Service: Vascular;  Laterality: N/A;  . ROTATOR CUFF REPAIR    . SHOULDER ARTHROSCOPY  2011   Dr Theda Sers  . SHOULDER ARTHROSCOPY Right 08/2015  . SPINAL FUSION  1986   Dr Rolin Barry  . TEE WITHOUT CARDIOVERSION N/A 03/23/2017   Procedure: TRANSESOPHAGEAL ECHOCARDIOGRAM (TEE);  Surgeon: Rexene Alberts, MD;  Location: [place];  Service: Open Heart Surgery;  Laterality: N/A;  . TRACHEOSTOMY     age 36 for croup     Social History   Social History  . Marital status: Married    Spouse name: N/A  . Number of children: 2  . Years of education: N/A   Occupational History  . 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 Smoking status: Former Smoker    Packs/day: 1.50    Years: 30.00    Types: Cigarettes  . Smokeless tobacco: Never Used     Comment: 1 ppd   . Alcohol use 0.0 oz/week     Comment: 6-7 cans of beer daily  . Drug use: No  . Sexual activity: Not on file   Other Topics Concern  . Not on file   Social History Narrative   Lives w/ wife      Allergies as of 04/16/2017      Reactions   Simvastatin Other (See Comments)   Mental status changes   Celecoxib Other (See Comments)   GI UPSET AND INFLAMMATION   Codeine Other (See Comments)   GI UPSET AND INFLAMMATION   Nsaids Other (See Comments)   GI UPSET AND INFLAMMATION (can tolerate via IV)   Tape Other (See Comments)   Medical tape and Band-Aids PULL OFF THE SKIN; please use Coban wrap   Cephalexin Itching, Rash   Latex Rash      Medication List       Accurate as of 04/16/17 11:59 PM. Always use your most recent med list.          albuterol 108 (90 Base) MCG/ACT inhaler Commonly known as:  PROAIR HFA Inhale two puffs every four to six hours as needed for cough or wheeze.   albuterol (2.5 MG/3ML) 0.083% nebulizer solution Commonly known as:  PROVENTIL Take 3 mLs (2.5 mg total) by nebulization every 8 (eight) hours as needed for wheezing or shortness of breath.   ALPRAZolam 0.5 MG tablet Commonly known as:  XANAX Take 0.5-1 tablets (0.25-0.5 mg total) by mouth at bedtime as needed for sleep.   aspirin 325 MG EC tablet Take 1 tablet (325 mg total) by mouth daily.   azelastine 0.1 % nasal spray Commonly known as:  ASTELIN Can use two sprays in each nostril twice daily as directed.   calcipotriene-betamethasone ointment Commonly known as:  TACLONEX Apply 1 application topically daily as needed  (for eczema).   escitalopram 5 MG tablet Commonly known as:  LEXAPRO Take 1 tablet (5 mg total) by mouth daily.   esomeprazole 40 MG capsule Commonly known as:  NEXIUM Take 1 capsule (40 mg total) by mouth daily before breakfast.   ferrous sulfate 325 (65 FE) MG tablet Take 1 tablet (325 mg total) by mouth daily. For one month then stop.   flecainide 100 MG tablet Commonly known as:  TAMBOCOR Take 1 tablet (100 mg total) by mouth 2 (two) times daily.   fluticasone 50 MCG/ACT nasal spray Commonly known as:  FLONASE Use one spray in each nostril twice daily.   furosemide  40 MG tablet Commonly known as:  LASIX Take 1 tablet (40 mg total) by mouth daily as needed.   ipratropium 0.06 % nasal spray Commonly known as:  ATROVENT Can use two sprays in each nostril every six hours as needed to dry up nose.   metoprolol tartrate 25 MG tablet Commonly known as:  LOPRESSOR Take 0.5 tablets (12.5 mg total) by mouth 2 (two) times daily.   potassium chloride SA 20 MEQ tablet Commonly known as:  K-DUR,KLOR-CON Take 1 tablet (20 mEq total) by mouth daily as needed.   rosuvastatin 5 MG tablet Commonly known as:  CRESTOR Take 5 mg by mouth every other day.   traMADol 50 MG tablet Commonly known as:  ULTRAM Take 50 mg by mouth every 4-6 hours PRN severe pain.            Discharge Care Instructions        Start     Ordered   04/16/17 0000  Comp Met (CMET)     04/16/17 1213   04/16/17 0000  CBC w/Diff     04/16/17 1213   04/16/17 0000  DG Chest 2 View    Question Answer Comment  Reason for Exam (SYMPTOM  OR DIAGNOSIS REQUIRED) SOB, s/p aortic dissection repair   Preferred imaging location? [facility]      04/16/17 1213   04/16/17 0000  escitalopram (LEXAPRO) 5 MG tablet  Daily     04/16/17 1215   04/16/17 0000  ALPRAZolam (XANAX) 0.5 MG tablet  At bedtime PRN     04/16/17 1215   04/16/17 0000  albuterol (PROVENTIL) (2.5 MG/3ML) 0.083% nebulizer solution  Every  8 hours PRN     04/16/17 1221         Objective:   Physical Exam BP 128/70 (BP Location: Left Arm, Patient Position: Sitting, Cuff Size: Small)   Pulse 72   Temp 98.2 F (36.8 C) (Oral)   Resp 14   Ht 6' 2"  (1.88 m)   Wt 183 lb (83 kg)   SpO2 90%   BMI 23.50 kg/m  General:   Well developed, well nourished. Looks tired but in no distress HEENT:  Normocephalic . Face symmetric, atraumatic Lungs:  Decreased breath sounds. Normal respiratory effort, no intercostal retractions, no accessory muscle use. Heart: RRR, no rub, no murmur.  +/+++ pretibial edema bilaterally  Skin: Not pale. Not jaundice Neurologic:  alert & oriented X3.  Speech normal, gait slow but unassisted. Upper extremity: Mild weakness, right grip Psych--  Cognition and judgment appear intact.  Cooperative with normal attention span and concentration.  Behavior appropriate. No anxious, slightly depressed.    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 Tried a cpap. [place] (-) 07-2015 CV: --P- Atrial fibrillation Dr Caryl Comes, on NSR, no anticoag --lexiscan 2013 --Cardiac catheterization July 2013: Normal LV FX,, mild nonobstructive CAD Dr. Angelena Form --thoracis Ao  dissection , acute, surgery 03-2017 GU: BPH, microscopic hematuria (negative cystoscopy 1978) Elevated PSA: saw urology  04-2016 Gynecomastia, Dr. Loanne Drilling  >> Bx (-) 10-2014 ; took tamoxifen temporarily (self d/c 06-2016), improved Hypogonadism-- dx per Dr Loanne Drilling, numbers improved d/t nolvadex  Sees dermatology  H/o  Tracheostomy , age 81, croup  PLAN: S/p thoracic aneurysm repair: Overall he is recovering well. Has episodes of difficulty breathing, unclear etiology, not  associated with chest pain or palpitations. Will check a CMP, CBC and a chest x-ray Will  switch from albuterol inhalers to nebulizations that seem to help better for episodes of shortness of breath at least when he was in the hospital. He has a right grip deficit, he said even before he left the hospital. Stroke?Marland Kitchen For now recommend observation. He already saw cardiology for follow-up, and they are scheduling an echocardiogram He was noted to be in sinus rhythm that day. Depression, insomnia: Patient is frustrated by the major medical problem he is recuperating from and ability to do things, he is counseled, rec to be patient, we agreed to start a low-dose of antidepressant, Lexapro 5 mg, also Xanax as needed. Smoker: Quit tobacco. RTC 4 months

## 2017-04-16 NOTE — Progress Notes (Signed)
Pre visit review using our clinic review tool, if applicable. No additional management support is needed unless otherwise documented below in the visit note.  Alprazolam Rx faxed to [facility].

## 2017-04-16 NOTE — Addendum Note (Signed)
Addended by: Claude Manges on: 04/16/2017 07:17 AM   Modules accepted: Orders

## 2017-04-16 NOTE — Telephone Encounter (Signed)
Medicare A/B - no co-payment, deductible $183.00/$183.00 has been met, no out of pocket and 20% co-insurance and no pre-authorization. Passport/reference [phone].  [place] - no co-payment, no deductible, no out of pocket, no co-insurance and no pre-authorization. Follows medicare guidelines. Spoke with [employer] on 04/16/17 - reference [id number].  Patient will be contacted and scheduled after their follow up appointment with the cardiologist on 05/04/17 and surgeon on 05/10/17, upon review by [facility] RN navigator.

## 2017-04-16 NOTE — Patient Instructions (Addendum)
GO TO THE LAB : Get the blood work     GO TO THE FRONT DESK Schedule your next appointment for a  checkup in 4 weeks     STOP BY THE FIRST FLOOR:  get the XR    Takes a xanax as needed for sleep  Start taking Lexapro 5 mg one tablet every day  Continue doing your breathing exercises  Lasix for swelling  If the difficulty breathing is more frequent, intense or not going away: Either call or go to the ER

## 2017-04-18 DIAGNOSIS — F32A Depression, unspecified: Secondary | ICD-10-CM | POA: Insufficient documentation

## 2017-04-18 DIAGNOSIS — F329 Major depressive disorder, single episode, unspecified: Secondary | ICD-10-CM | POA: Insufficient documentation

## 2017-04-18 NOTE — Assessment & Plan Note (Signed)
S/p thoracic aneurysm repair: Overall he is recovering well. Has episodes of difficulty breathing, unclear etiology, not associated with chest pain or palpitations. Will check a CMP, CBC and a chest x-ray Will switch from albuterol inhalers to nebulizations that seem to help better for episodes of shortness of breath at least when he was in the hospital. He has a right grip deficit, he said even before he left the hospital. Stroke?Marland Kitchen For now recommend observation. He already saw cardiology for follow-up, and they are scheduling an echocardiogram He was noted to be in sinus rhythm that day. Depression, insomnia: Patient is frustrated by the major medical problem he is recuperating from and ability to do things, he is counseled, rec to be patient, we agreed to start a low-dose of antidepressant, Lexapro 5 mg, also Xanax as needed. Smoker: Quit tobacco. RTC 4 months

## 2017-04-19 DIAGNOSIS — Z953 Presence of xenogenic heart valve: Secondary | ICD-10-CM | POA: Diagnosis not present

## 2017-04-19 DIAGNOSIS — Z48812 Encounter for surgical aftercare following surgery on the circulatory system: Secondary | ICD-10-CM | POA: Diagnosis not present

## 2017-04-20 DIAGNOSIS — Z48812 Encounter for surgical aftercare following surgery on the circulatory system: Secondary | ICD-10-CM | POA: Diagnosis not present

## 2017-04-20 DIAGNOSIS — Z953 Presence of xenogenic heart valve: Secondary | ICD-10-CM | POA: Diagnosis not present

## 2017-04-21 ENCOUNTER — Telehealth: Payer: Self-pay | Admitting: Internal Medicine

## 2017-04-21 MED ORDER — ALPRAZOLAM 0.5 MG PO TABS: 1.0000 mg | ORAL_TABLET | Freq: Every evening | ORAL | Status: DC | PRN

## 2017-04-21 NOTE — Telephone Encounter (Signed)
Please advise 

## 2017-04-21 NOTE — Telephone Encounter (Signed)
Okay to give orders? 

## 2017-04-21 NOTE — Telephone Encounter (Signed)
°  Relation to pt: self  Call back number:[phone] Pharmacy:  Reason for call:  Spouse states ALPRAZolam (XANAX) 0.5 MG tablet is not working and would like MD to prescribe another Rx or increase MG, spouse states patient has not slept from heart surgery, please advise

## 2017-04-21 NOTE — Telephone Encounter (Signed)
Spoke w/ [name], Pt's wife, informed of recommendations. Informed to call in several days to let us know how Pt is doing on new dosage of Xanax. [name] verbalized understanding.

## 2017-04-21 NOTE — Telephone Encounter (Signed)
Yes

## 2017-04-21 NOTE — Telephone Encounter (Signed)
LMOM for Don w/ verbal orders.  

## 2017-04-21 NOTE — Telephone Encounter (Signed)
Advise patient to try Xanax 0.5 mg: 2 or even 3 tablets at bedtime. Watch for excessive somnolence.  I anticipate that once Lexapro "kicks in" the need for xanax will decrease.

## 2017-04-21 NOTE — Telephone Encounter (Signed)
[facility] [place] [phone]   Frequency 1 time a week for 2 weeks   Exercise and fine motor control program.   Okay to discharge when goal is met

## 2017-04-26 ENCOUNTER — Telehealth: Payer: Self-pay | Admitting: *Deleted

## 2017-04-26 DIAGNOSIS — Z953 Presence of xenogenic heart valve: Secondary | ICD-10-CM | POA: Diagnosis not present

## 2017-04-26 DIAGNOSIS — Z48812 Encounter for surgical aftercare following surgery on the circulatory system: Secondary | ICD-10-CM | POA: Diagnosis not present

## 2017-04-26 NOTE — Telephone Encounter (Signed)
Received Home Health Certification and Plan of Care; forwarded to provider/SLS 09/17

## 2017-04-27 DIAGNOSIS — Z48812 Encounter for surgical aftercare following surgery on the circulatory system: Secondary | ICD-10-CM | POA: Diagnosis not present

## 2017-04-27 DIAGNOSIS — Z953 Presence of xenogenic heart valve: Secondary | ICD-10-CM | POA: Diagnosis not present

## 2017-04-27 NOTE — Telephone Encounter (Signed)
Plan of Care signed and faxed to [facility] at [phone]. Form sent for scanning.

## 2017-04-28 ENCOUNTER — Ambulatory Visit (HOSPITAL_COMMUNITY): Payer: Medicare Other | Attending: Physician Assistant

## 2017-04-28 ENCOUNTER — Other Ambulatory Visit: Payer: Medicare Other | Admitting: *Deleted

## 2017-04-28 ENCOUNTER — Other Ambulatory Visit: Payer: Self-pay | Admitting: Physician Assistant

## 2017-04-28 ENCOUNTER — Other Ambulatory Visit: Payer: Self-pay

## 2017-04-28 DIAGNOSIS — Z9889 Other specified postprocedural states: Secondary | ICD-10-CM | POA: Insufficient documentation

## 2017-04-28 DIAGNOSIS — I313 Pericardial effusion (noninflammatory): Secondary | ICD-10-CM

## 2017-04-28 DIAGNOSIS — I517 Cardiomegaly: Secondary | ICD-10-CM | POA: Insufficient documentation

## 2017-04-28 DIAGNOSIS — Z720 Tobacco use: Secondary | ICD-10-CM | POA: Diagnosis not present

## 2017-04-28 DIAGNOSIS — R0600 Dyspnea, unspecified: Secondary | ICD-10-CM | POA: Diagnosis not present

## 2017-04-28 DIAGNOSIS — I7100 Dissection of unspecified site of aorta: Secondary | ICD-10-CM | POA: Diagnosis not present

## 2017-04-28 DIAGNOSIS — I3139 Other pericardial effusion (noninflammatory): Secondary | ICD-10-CM

## 2017-04-28 LAB — ECHOCARDIOGRAM LIMITED
AOVTI: 51.9 cm
AV Area VTI index: 0.61 cm2/m2
AV Area mean vel: 1.39 cm2
AV Peak grad: 23 mmHg
AV area mean vel ind: 0.67 cm2/m2
AV pk vel: 240 cm/s
AVAREAVTI: 1.33 cm2
AVCELMEANRAT: 0.4
AVG: 13 mmHg
Ao pk vel: 0.39 m/s
CHL CUP AV PEAK INDEX: 0.64
CHL CUP AV VEL: 1.27
CHL CUP DOP CALC LVOT VTI: 19 cm
CHL CUP MV DEC (S): 239
DOP CAL AO MEAN VELOCITY: 168 cm/s
EWDT: 239 ms
FS: 11 % — AB (ref 28–44)
IV/PV OW: 0.87
LADIAMINDEX: 1.97 cm/m2
LASIZE: 41 mm
LEFT ATRIUM END SYS DIAM: 41 mm
LVOT area: 3.46 cm2
LVOT diameter: 21 mm
LVOT peak VTI: 0.37 cm
LVOTPV: 92.4 cm/s
LVOTSV: 66 mL
MV pk A vel: 66 m/s
MV pk E vel: 49 m/s
MVAP: 3.14 cm2
MVSPHT: 70 ms
PW: 15 mm — AB (ref 0.6–1.1)
RV sys press: 28 mmHg
Reg peak vel: 251 cm/s
TR max vel: 251 cm/s
TVPG: 251 mmHg
Valve area index: 0.61
Valve area: 1.27 cm2

## 2017-04-28 LAB — BASIC METABOLIC PANEL
BUN / CREAT RATIO: 7 — AB (ref 10–24)
BUN: 6 mg/dL — AB (ref 8–27)
CHLORIDE: 97 mmol/L (ref 96–106)
CO2: 22 mmol/L (ref 20–29)
Calcium: 8.7 mg/dL (ref 8.6–10.2)
Creatinine, Ser: 0.88 mg/dL (ref 0.76–1.27)
GFR calc Af Amer: 103 mL/min/{1.73_m2} (ref >59)
GFR calc non Af Amer: 89 mL/min/{1.73_m2} (ref >59)
GLUCOSE: 130 mg/dL — AB (ref 65–99)
POTASSIUM: 3.9 mmol/L (ref 3.5–5.2)
Sodium: 132 mmol/L — ABNORMAL LOW (ref 134–144)

## 2017-04-29 ENCOUNTER — Telehealth: Payer: Self-pay | Admitting: Internal Medicine

## 2017-04-29 MED ORDER — ALPRAZOLAM 1 MG PO TABS: 1.0000 mg | ORAL_TABLET | Freq: Every day | ORAL | 0 refills | Status: DC

## 2017-04-29 NOTE — Telephone Encounter (Signed)
Please advise 

## 2017-04-29 NOTE — Telephone Encounter (Signed)
Patient got xanax 0.5 mg #30 ~ 2 weeks ago. Change Xanax to 1 mg: 1 to 1.5 tab at night QHS, #40, no RF Consider changing to another medication.

## 2017-04-29 NOTE — Telephone Encounter (Signed)
°  Relation to SH:NGIT Call back number:[phone] Pharmacy: [place], [place] [phone] (Phone) [phone] (Fax)     Reason for call:  Spouse requesting ALPRAZolam Duanne Moron) 0.5 MG tablet refill due to patient being discharge from the hospital and not sleeping, spouse states patient is now down to 1x daily,please advise

## 2017-04-29 NOTE — Telephone Encounter (Signed)
Rx changed to Xanax 1mg . Rx faxed to [facility].

## 2017-04-30 ENCOUNTER — Telehealth: Payer: Self-pay | Admitting: Internal Medicine

## 2017-04-30 ENCOUNTER — Telehealth: Payer: Self-pay

## 2017-04-30 NOTE — Telephone Encounter (Signed)
Just an FYI

## 2017-04-30 NOTE — Telephone Encounter (Signed)
Caller name: Timmothy Sours  Relation to pt: OT from [place] Call back number: [phone]    Reason for call:  OT wanted to inform PCP patient cancelled physical therapy appointment today due to diarrhea, next appointment scheduled for Sunday 05/07/17.

## 2017-04-30 NOTE — Telephone Encounter (Signed)
Plan of Care received from [facility], form signed and faxed to [phone]. Form sent for scanning.

## 2017-04-30 NOTE — Telephone Encounter (Signed)
thx

## 2017-05-04 ENCOUNTER — Ambulatory Visit (INDEPENDENT_AMBULATORY_CARE_PROVIDER_SITE_OTHER): Payer: Medicare Other | Admitting: Internal Medicine

## 2017-05-04 ENCOUNTER — Telehealth: Payer: Self-pay | Admitting: Internal Medicine

## 2017-05-04 ENCOUNTER — Encounter: Payer: Self-pay | Admitting: Internal Medicine

## 2017-05-04 ENCOUNTER — Telehealth: Payer: Self-pay | Admitting: *Deleted

## 2017-05-04 VITALS — BP 136/80 | HR 63 | Ht 73.0 in | Wt 175.0 lb

## 2017-05-04 DIAGNOSIS — I480 Paroxysmal atrial fibrillation: Secondary | ICD-10-CM | POA: Diagnosis not present

## 2017-05-04 DIAGNOSIS — I313 Pericardial effusion (noninflammatory): Secondary | ICD-10-CM

## 2017-05-04 DIAGNOSIS — I3139 Other pericardial effusion (noninflammatory): Secondary | ICD-10-CM

## 2017-05-04 DIAGNOSIS — I7103 Dissection of thoracoabdominal aorta: Secondary | ICD-10-CM

## 2017-05-04 DIAGNOSIS — Z953 Presence of xenogenic heart valve: Secondary | ICD-10-CM | POA: Diagnosis not present

## 2017-05-04 DIAGNOSIS — Z48812 Encounter for surgical aftercare following surgery on the circulatory system: Secondary | ICD-10-CM | POA: Diagnosis not present

## 2017-05-04 MED ORDER — METOPROLOL TARTRATE 25 MG PO TABS: 25.0000 mg | ORAL_TABLET | Freq: Two times a day (BID) | ORAL | 3 refills | Status: DC

## 2017-05-04 NOTE — Telephone Encounter (Signed)
Called to get verbal order to receive orders from Cardiologist ([place])   He said that pt's Metoprolol dosage was changed.   Timmothy Sours -OT- [phone]

## 2017-05-04 NOTE — Telephone Encounter (Signed)
Received Physician Orders from [facility]; forwarded to provider/SLS 09/25

## 2017-05-04 NOTE — Patient Instructions (Signed)
Medication Instructions: - Your physician has recommended you make the following change in your medication:  1) INCREASE metoprolol tartrate 25 mg- take 1 full tablet by mouth twice daily  Labwork: - none ordered  Procedures/Testing: - none ordered  Follow-Up: - Your physician wants you to follow-up in: 4 months with Dr. Caryl Comes. You will receive a reminder letter in the mail two months in advance. If you don't receive a letter, please call our office to schedule the follow-up appointment.   Any Additional Special Instructions Will Be Listed Below (If Applicable).     If you need a refill on your cardiac medications before your next appointment, please call your pharmacy.

## 2017-05-04 NOTE — Progress Notes (Signed)
Patient Care Team: Colon Branch, MD as PCP - General (Internal Medicine) Deboraha Sprang, MD as Consulting Physician (Cardiology) Deneise Lever, MD as Consulting Physician (Pulmonary Disease) Renato Shin, MD as Consulting Physician (Endocrinology) Darleen Crocker, MD as Consulting Physician (Ophthalmology) Loletha Carrow Kirke Corin, MD as Consulting Physician (Gastroenterology)   HPI  Sean Riley is a 66 y.o. male  Seen in follow for atrial fibrillation. He continues to take flecainide; he's been largely asymptomatic without symptomatic recurrences.  There is a concern that he may be allergic to diltiazem as he has a pruritic rash;  there was an issue with a rash. He isn't back up on diltiazem as it did not resolve off drugs.   He still has complaints of shortness of breath. Pulmonary function testing 5/15 demonstrated mild emphysema..  He has a spell a few months ago when he got off a tractor had 40 seconds of a very unusual sensation with difficulty breathing some lightheadedness no associated palpitations or chest discomfort and then it resolved.  .  There has been no syncope or presyncope.    He underwent Myoview scanning 2013 which was abnormal and underwent catheterization which was normal. He remains on flecainide   Date Flecainide Dose PR QRS  5/12   112         5/15 100  116  5/16 100  132  6/17 100  132  12/17 100  136  9/18 100 166 144          Hospitalized 8/18 for aortic dissection w root replacment and reconstruction of entire ascending aorta cx by pericardial effusion requiring pericardiocentesis and some R hand injury  Many questions regarding the cause of the aortic dissection. The recall Dr. Ricard Dillon saying that prior imaging had demonstrated a dissection.  Past Medical History:  Diagnosis Date  . Allergy   . Arthritis   . 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 Atrial fibrillation ([place])   . Cataract    removed both eyes   . GERD  (gastroesophageal reflux disease)   . HYPERLIPIDEMIA   . 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 Hypertension   . Microscopic hematuria    negative cystoscopy  . NONSPECIFIC ABNORMAL ELECTROCARDIOGRAM   . S/P aortic dissection repair 03/24/2017   Biological Bentall aortic root replacement + resection and grafting of entire ascending aorta, transverse aortic arch and proximal descending thoracic aorta with elephant trunk distal anastomosis and debranching of aortic arch vessels  . 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 Varicose veins     Past Surgical History:  Procedure Laterality Date  . APPENDECTOMY    . CARDIAC CATHETERIZATION      X3;Dr Caryl Comes, last in July, 2013  . COLONOSCOPY    . CORONARY ANGIOPLASTY     04/24/13 : he denies angioplasty  . CYSTOSCOPY  1978   Dr Hartley Barefoot  . KNEE ARTHROSCOPY  2012   Dr Theda Sers  . LUMBAR LAMINECTOMY  1987   Dr Durward Fortes  . MYELOGRAM  2007  . PERICARDIOCENTESIS N/A 04/07/2017   Procedure: PERICARDIOCENTESIS;  Surgeon: Sherren Mocha, MD;  Location: [facility];  Service: Cardiovascular;  Laterality: N/A;  . POLYPECTOMY    . REPAIR OF ACUTE ASCENDING THORACIC AORTIC  DISSECTION N/A 03/23/2017   Procedure: REPAIR OF ACUTE ASCENDING THORACIC AORTIC DISSECTION.  Bentall procedure.  Aortic root repleacement with bioprosthetic valve.  Reimplantation of left and right coronary arteries.  Total resection of transverse aortic arch.  Elephant trunk distal anastomosis and debranching of arch vessels.;  Surgeon: Rexene Alberts, MD;  Location: [facility];  Service: Vascular;  Laterality: N/A;  . ROTATOR CUFF REPAIR    . SHOULDER ARTHROSCOPY  2011   Dr Theda Sers  . SHOULDER ARTHROSCOPY Right 08/2015  . SPINAL FUSION  1986   Dr Rolin Barry  . TEE  WITHOUT CARDIOVERSION N/A 03/23/2017   Procedure: TRANSESOPHAGEAL ECHOCARDIOGRAM (TEE);  Surgeon: Rexene Alberts, MD;  Location: [facility];  Service: Open Heart Surgery;  Laterality: N/A;  . TRACHEOSTOMY     age 53 for croup    Current Outpatient Prescriptions  Medication Sig Dispense Refill  . albuterol (PROAIR HFA) 108 (90 Base) MCG/ACT inhaler Inhale two puffs every four to six hours as needed for cough or wheeze. (Patient taking differently: Inhale 2 puffs into the lungs See admin instructions. Every 4-6 hours as needed for coughing or wheezing) 3 Inhaler 0  . albuterol (PROVENTIL) (2.5 MG/3ML) 0.083% nebulizer solution Take 3 mLs (2.5 mg total) by nebulization every 8 (eight) hours as needed for wheezing or shortness of breath. 150 mL 1  . ALPRAZolam (XANAX) 1 MG tablet Take 1-1.5 tablets (1-1.5 mg total) by mouth at bedtime. 40 tablet 0  . aspirin EC 325 MG EC tablet Take 1 tablet (325 mg total) by mouth daily. 30 tablet 0  . azelastine (ASTELIN) 0.1 % nasal spray Can use two sprays in each nostril twice daily as directed. (Patient taking differently: Place 2 sprays into both nostrils 2 (two) times daily. ) 90 mL 3  . calcipotriene-betamethasone (TACLONEX) ointment Apply 1 application topically daily as needed (for eczema).     . escitalopram (LEXAPRO) 5 MG tablet Take 1 tablet (5 mg total) by mouth daily. 30 tablet 1  . esomeprazole (NEXIUM) 40 MG capsule Take 1 capsule (40 mg total) by mouth daily before breakfast. 90 capsule 3  . ferrous sulfate 325 (65 FE) MG tablet Take 1 tablet (325 mg total) by mouth daily. For one month then stop. 30 tablet 0  . flecainide (TAMBOCOR) 100 MG tablet Take 1 tablet (100 mg total) by mouth 2 (two) times daily. 12 tablet 0  . fluticasone (FLONASE) 50 MCG/ACT nasal spray Use one spray in each nostril twice daily. 48 g 3  . furosemide (LASIX) 40 MG tablet Take 1 tablet (40 mg total) by mouth daily as needed. 30 tablet 0  . ipratropium (ATROVENT) 0.06 % nasal  spray Can use two sprays in each nostril every six hours as needed to dry up nose. (Patient taking differently: Place 2 sprays into both nostrils every 6 (six) hours as needed for rhinitis. ) 45 mL 3  . metoprolol tartrate (LOPRESSOR) 25 MG tablet Take 0.5 tablets (12.5 mg total) by mouth 2 (two) times daily. 30 tablet 1  . potassium chloride SA (K-DUR,KLOR-CON) 20 MEQ tablet Take 1 tablet (20 mEq total) by mouth daily as needed. 30 tablet 0  . rosuvastatin (CRESTOR) 5 MG tablet Take 5 mg by mouth every other day.    . traMADol (ULTRAM) 50 MG tablet Take 50 mg by mouth every 4-6 hours PRN severe pain. 28 tablet 0   No current facility-administered medications for this visit.     Allergies  Allergen Reactions  .  Simvastatin Other (See Comments)    Mental status changes  . Celecoxib Other (See Comments)    GI UPSET AND INFLAMMATION  . Codeine Other (See Comments)    GI UPSET AND INFLAMMATION  . Nsaids Other (See Comments)    GI UPSET AND INFLAMMATION (can tolerate via IV)  . Tape Other (See Comments)    Medical tape and Band-Aids PULL OFF THE SKIN; please use Coban wrap  . Cephalexin Itching and Rash  . Latex Rash    Review of Systems negative except from HPI and PMH  Physical Exam BP 136/80   Pulse 63   Ht 6\' 1"  (1.854 m)   Wt 175 lb (79.4 kg)   SpO2 96%   BMI 23.09 kg/m  Well developed and nourished in no acute distress HENT normal Neck supple with JVP-flat Carotids brisk and full without bruits Clear Regular rate and rhythm, 2/6 murmur  Abd-soft with active BS without hepatomegaly No Clubbing cyanosis edema Skin-warm and dry A & Oriented  Grossly normal sensory and motor function Numbness and weakness of his right hand   ECG demonstrated sinus rhythm at 67 17/14/48 axis left -51  right bundle branch block Left anterior fascicular block  Assessment and Plan  Atrial fibrillation  Hypertension  Aortic dissection status post reconstruction     The patient is  currently on aspirin following his aortic reconstruction. I suspect she will be more amenable to anticoagulation. I will wait until we hear from Dr. CO as to the timing or discontinuation of aspirin and the use of anticoagulation  Surveillance of his reconstruction I will defer to Dr. Faye Ramsay  Blood pressure is not yet adequately controlled   I will increase his metoprolol from 12.5--25 mg twice daily.  He is on statin therapy.  Genetic evaluation will be deferred to Dr. Faye Ramsay.  They also had multiple questions as noted above regarding the cause and whether evidence of his aneurysm was present on prior imaging. I will defer this also to Dr. CO. Reviewing imaging reports, I did not see mention of this and I relayed that to the family.  More than 50% of 40 min was spent in counseling related to the above

## 2017-05-04 NOTE — Telephone Encounter (Signed)
LMOM for [facility] w/ Dr. Olin Pia number.

## 2017-05-05 DIAGNOSIS — Z953 Presence of xenogenic heart valve: Secondary | ICD-10-CM | POA: Diagnosis not present

## 2017-05-05 DIAGNOSIS — Z48812 Encounter for surgical aftercare following surgery on the circulatory system: Secondary | ICD-10-CM | POA: Diagnosis not present

## 2017-05-05 NOTE — Telephone Encounter (Signed)
Orders signed and faxed to [facility] at [phone]. Form sent for scanning.  

## 2017-05-06 ENCOUNTER — Telehealth: Payer: Self-pay | Admitting: *Deleted

## 2017-05-06 DIAGNOSIS — Z48812 Encounter for surgical aftercare following surgery on the circulatory system: Secondary | ICD-10-CM | POA: Diagnosis not present

## 2017-05-06 DIAGNOSIS — Z953 Presence of xenogenic heart valve: Secondary | ICD-10-CM | POA: Diagnosis not present

## 2017-05-06 NOTE — Telephone Encounter (Signed)
Received Physician Orders from [facility]; forwarded to provider/SLS 09/27

## 2017-05-06 NOTE — Addendum Note (Signed)
Addended by: Bobby Rumpf C on: 05/06/2017 07:30 AM   Modules accepted: Orders

## 2017-05-07 ENCOUNTER — Other Ambulatory Visit: Payer: Self-pay | Admitting: Thoracic Surgery (Cardiothoracic Vascular Surgery)

## 2017-05-07 DIAGNOSIS — I7101 Dissection of ascending aorta: Secondary | ICD-10-CM

## 2017-05-10 ENCOUNTER — Ambulatory Visit
Admission: RE | Admit: 2017-05-10 | Discharge: 2017-05-10 | Disposition: A | Discharge status: Home / Self Care | Payer: Medicare Other | Source: Ambulatory Visit | Attending: Thoracic Surgery (Cardiothoracic Vascular Surgery) | Admitting: Thoracic Surgery (Cardiothoracic Vascular Surgery)

## 2017-05-10 ENCOUNTER — Ambulatory Visit (INDEPENDENT_AMBULATORY_CARE_PROVIDER_SITE_OTHER): Payer: Self-pay | Admitting: Thoracic Surgery (Cardiothoracic Vascular Surgery)

## 2017-05-10 ENCOUNTER — Encounter: Payer: Self-pay | Admitting: Thoracic Surgery (Cardiothoracic Vascular Surgery)

## 2017-05-10 VITALS — BP 119/71 | HR 59 | Resp 16 | Ht 73.0 in | Wt 172.0 lb

## 2017-05-10 DIAGNOSIS — I7101 Dissection of ascending aorta: Secondary | ICD-10-CM

## 2017-05-10 DIAGNOSIS — Z9889 Other specified postprocedural states: Secondary | ICD-10-CM

## 2017-05-10 DIAGNOSIS — I71019 Dissection of thoracic aorta, unspecified: Secondary | ICD-10-CM

## 2017-05-10 DIAGNOSIS — J90 Pleural effusion, not elsewhere classified: Secondary | ICD-10-CM | POA: Diagnosis not present

## 2017-05-10 DIAGNOSIS — Z09 Encounter for follow-up examination after completed treatment for conditions other than malignant neoplasm: Secondary | ICD-10-CM

## 2017-05-10 DIAGNOSIS — Z953 Presence of xenogenic heart valve: Secondary | ICD-10-CM

## 2017-05-10 NOTE — Patient Instructions (Addendum)
Continue all previous medications without any changes at this time  Continue to avoid smoking permanently.  Continue to avoid any heavy lifting or strenuous use of your arms or shoulders for at least a total of three months from the time of surgery.  After three months you may gradually increase how much you lift or otherwise use your arms or chest as tolerated, with limits based upon whether or not activities lead to the return of significant discomfort.  You may return to driving an automobile as long as you are no longer requiring oral narcotic pain relievers during the daytime.  It would be wise to start driving only short distances during the daylight and gradually increase from there as you feel comfortable.  You are encouraged to enroll and participate in the outpatient cardiac rehab program beginning as soon as practical.  Monitor your blood pressure carefully.  You are advised regarding the need for life long follow-up and physical restrictions related to the history of aortic dissection including the need for strict blood pressure control, periodic radiographic follow-up examinations, and avoidance of certain types of physical activity such as heavy lifting or other strenuous physical activities which tend to increase pressure within the chest or abdomen.  Endocarditis is a potentially serious infection of heart valves or inside lining of the heart.  It occurs more commonly in patients with diseased heart valves (such as patient's with aortic or mitral valve disease) and in patients who have undergone heart valve repair or replacement.  Certain surgical and dental procedures may put you at risk, such as dental cleaning, other dental procedures, or any surgery involving the respiratory, urinary, gastrointestinal tract, gallbladder or prostate gland.   To minimize your chances for develooping endocarditis, maintain good oral health and seek prompt medical attention for any infections  involving the mouth, teeth, gums, skin or urinary tract.    Always notify your doctor or dentist about your underlying heart valve condition before having any invasive procedures. You will need to take antibiotics before certain procedures, including all routine dental cleanings or other dental procedures.  Your cardiologist or dentist should prescribe these antibiotics for you to be taken ahead of time.

## 2017-05-10 NOTE — Telephone Encounter (Signed)
Orders signed and faxed to [facility] at [phone]. Form sent for scanning.  

## 2017-05-10 NOTE — Progress Notes (Signed)
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  The patient's early postoperative recovery was notable for acute on chronic respiratory failure related to severe COPD and acute volume excess, all of which gradually improved. He developed recurrent paroxysmal atrial fibrillation and a moderate sized pericardial effusion for which he eventually underwent pericardiocentesis. Follow-up CT angiogram performed 03/31/2017 revealed intact repair of the ascending thoracic aorta and transverse aortic arch was stable dissection involving the descending thoracic and abdominal aorta. He slowly improved and eventually was discharged home 04/09/2017.  Since hospital discharge she has been seen in follow-up by Tommye Standard on 04/14/2017, Dr. Larose Kells on 04/16/2017, and more recently by Dr. Caryl Comes on 05/04/2017.  Follow-up echocardiogram performed at that time revealed trivial residual pericardial effusion and normal left ventricular function. He returns to our office today for routine follow-up.  Both the patient and his family note that the patient is doing remarkably  well. His mental status returned to baseline within a few days of returning home. He has been gradually increasing his physical activity. He still feels weak and his appetite is marginal. He has no pain in his chest. He denies any shortness of breath. He has some mild residual weakness in his right forearm and hand, but this is improving. Overall he seems to be progressing quite well and he is asking about driving an automobile. He is not smoking.   Current Outpatient Prescriptions  Medication Sig Dispense Refill  . albuterol (PROAIR HFA) 108 (90 Base) MCG/ACT inhaler Inhale two puffs every four to six hours as needed for cough or wheeze. (Patient taking differently: Inhale 2 puffs into the lungs See admin instructions. Every 4-6 hours as needed for coughing or wheezing) 3 Inhaler 0  . albuterol (PROVENTIL) (2.5 MG/3ML) 0.083% nebulizer solution Take 3 mLs (2.5 mg total) by nebulization every 8 (eight) hours as needed for wheezing or shortness of breath. 150 mL 1  . ALPRAZolam (XANAX) 1 MG tablet Take 1-1.5 tablets (1-1.5 mg total) by mouth at bedtime. 40 tablet 0  . aspirin EC 325 MG EC tablet Take 1 tablet (325 mg total) by mouth daily. 30 tablet 0  . azelastine (ASTELIN) 0.1 % nasal spray Can use two sprays in each nostril twice daily as directed. (Patient taking differently: Place 2 sprays into both nostrils 2 (two) times daily. ) 90 mL 3  . calcipotriene-betamethasone (TACLONEX) ointment Apply 1 application topically daily as needed (for eczema).     . escitalopram (LEXAPRO) 5 MG tablet Take 1 tablet (5 mg total) by mouth daily. 30 tablet 1  . esomeprazole (NEXIUM) 40 MG capsule Take 1 capsule (40 mg total) by  mouth daily before breakfast. 90 capsule 3  . ferrous sulfate 325 (65 FE) MG tablet Take 1 tablet (325 mg total) by mouth daily. For one month then stop. 30 tablet 0  . flecainide (TAMBOCOR) 100 MG tablet Take 1 tablet (100 mg total) by mouth 2 (two) times daily. 12 tablet 0  . fluticasone  (FLONASE) 50 MCG/ACT nasal spray Use one spray in each nostril twice daily. 48 g 3  . furosemide (LASIX) 40 MG tablet Take 1 tablet (40 mg total) by mouth daily as needed. 30 tablet 0  . ipratropium (ATROVENT) 0.06 % nasal spray Can use two sprays in each nostril every six hours as needed to dry up nose. (Patient taking differently: Place 2 sprays into both nostrils every 6 (six) hours as needed for rhinitis. ) 45 mL 3  . metoprolol tartrate (LOPRESSOR) 25 MG tablet Take 1 tablet (25 mg total) by mouth 2 (two) times daily. 180 tablet 3  . potassium chloride SA (K-DUR,KLOR-CON) 20 MEQ tablet Take 1 tablet (20 mEq total) by mouth daily as needed. 30 tablet 0  . rosuvastatin (CRESTOR) 5 MG tablet Take 5 mg by mouth every other day.    . traMADol (ULTRAM) 50 MG tablet Take 50 mg by mouth every 4-6 hours PRN severe pain. (Patient not taking: Reported on 05/10/2017) 28 tablet 0   No current facility-administered medications for this visit.       Physical Exam:   BP 119/71 (BP Location: Right Arm, Patient Position: Sitting, Cuff Size: Normal)   Pulse (!) 59   Resp 16   Ht 6\' 1"  (1.854 m)   Wt 172 lb (78 kg)   SpO2 99% Comment: ON RA  BMI 22.69 kg/m   General:  Well-appearing  Chest:   Clear to auscultation  CV:   Regular rate and rhythm with systolic murmur along the right sternal border  Incisions:  Healing nicely, sternum is stable  Abdomen:  Soft and nontender  Extremities:  Warm and well-perfused, no edema  Diagnostic Tests:  Transthoracic Echocardiography  Patient:    [place], Sean Riley MR #:       [id number] Study Date: 04/28/2017 Gender:     M Age:        54 Height:     188 cm Weight:     83 kg BSA:        2.08 m^2 Pt. Status: Room:   SONOGRAPHER  Marygrace Drought, RCS  PERFORMING   [facility], Outpatient  ATTENDING    Tommye Standard Vicenta Aly, Renee Jeani Hawking  REFERRING    Baldwin [place]  cc:  ------------------------------------------------------------------- LV EF: 50% -   55%  ------------------------------------------------------------------- Indications:      Pericardial Effusion (I31.3).  ------------------------------------------------------------------- History:   PMH:  s/p Aortic dissection, s/p bioprosthetic valve, Dyspnea.  Risk factors:  Current tobacco use.  ------------------------------------------------------------------- Study Conclusions  - Left ventricle: The cavity size was mildly dilated. Wall   thickness was increased in a pattern of mild LVH. Systolic   function was normal. The estimated ejection fraction was in the   range of 50% to 55%. - Aortic valve: 25 mm [facility] Ease bioprosthetic valve.   Normal appearing bioprosthetic valve with trival central AR.   Valve area (VTI): 1.27 cm^2. Valve area (Vmax): 1.33 cm^2. Valve   area (Vmean): 1.39 cm^2. - Left atrium: The atrium was mildly dilated. - Pericardium, extracardiac: A trivial pericardial effusion was   identified  posterior to the heart. - Impressions: no parasternal or suprasternal images done  Impressions:  - no parasternal or suprasternal images done  ------------------------------------------------------------------- Study data:  Comparison was made to the study of 04/07/2017.  Study status:  Routine.  Procedure:  The patient reported no pain pre or post test. Transthoracic echocardiography. Image quality was adequate.          Transthoracic echocardiography.  M-mode, limited 2D, limited spectral Doppler, and color Doppler.  Birthdate: Patient birthdate: Dec 04, 1950.  Age:  Patient is 66 yr old.  Sex: Gender: male.    BMI: 23.5 kg/m^2.  Blood pressure:     128/70 Patient status:  Outpatient.  Study date:  Study date: 04/28/2017. Study time: 11:19 AM.  Location:  [facility]  3  -------------------------------------------------------------------  ------------------------------------------------------------------- Left ventricle:  The cavity size was mildly dilated. Wall thickness was increased in a pattern of mild LVH. Systolic function was normal. The estimated ejection fraction was in the range of 50% to 55%.  ------------------------------------------------------------------- Aortic valve:  25 mm [facility] Ease bioprosthetic valve. Normal appearing bioprosthetic valve with trival central AR.  Doppler: VTI ratio of LVOT to aortic valve: 0.37. Valve area (VTI): 1.27 cm^2. Indexed valve area (VTI): 0.61 cm^2/m^2. Peak velocity ratio of LVOT to aortic valve: 0.39. Valve area (Vmax): 1.33 cm^2. Indexed valve area (Vmax): 0.64 cm^2/m^2. Mean velocity ratio of LVOT to aortic valve: 0.4. Valve area (Vmean): 1.39 cm^2. Indexed valve area (Vmean): 0.67 cm^2/m^2.    Mean gradient (S): 13 mm Hg. Peak gradient (S): 23 mm Hg.  ------------------------------------------------------------------- Aorta:  Aortic root: The aortic root was normal in size. Ascending aorta: The ascending aorta was normal in size.  ------------------------------------------------------------------- Mitral valve:   Mildly thickened leaflets .  Doppler:  There was trivial regurgitation.    Valve area by pressure half-time: 3.14 cm^2. Indexed valve area by pressure half-time: 1.51 cm^2/m^2.  ------------------------------------------------------------------- Left atrium:  The atrium was mildly dilated.  ------------------------------------------------------------------- Atrial septum:  Poorly visualized.  ------------------------------------------------------------------- Pulmonic valve:    Doppler:  There was mild regurgitation.  ------------------------------------------------------------------- Tricuspid valve:   Doppler:  There was mild  regurgitation.  ------------------------------------------------------------------- Right atrium:  The atrium was normal in size.  ------------------------------------------------------------------- Pericardium:  A trivial pericardial effusion was identified posterior to the heart.  ------------------------------------------------------------------- Measurements   Left ventricle                            Value          Reference  LV ID, ED, PLAX chordal           (H)     56    mm       43 - 52  LV ID, ES, PLAX chordal           (H)     50    mm       23 - 38  LV fx shortening, PLAX chordal    (L)     11    %        >=29  LV PW thickness, ED                       15    mm       ---------  IVS/LV PW ratio, ED  0.87           <=1.3  Stroke volume, 2D                         66    ml       ---------  Stroke volume/bsa, 2D                     32    ml/m^2   ---------    Ventricular septum                        Value          Reference  IVS thickness, ED                         13    mm       ---------    LVOT                                      Value          Reference  LVOT ID, S                                21    mm       ---------  LVOT area                                 3.46  cm^2     ---------  LVOT peak velocity, S                     92.4  cm/s     ---------  LVOT mean velocity, S                     67.5  cm/s     ---------  LVOT VTI, S                               19    cm       ---------    Aortic valve                              Value          Reference  Aortic valve peak velocity, S             240   cm/s     ---------  Aortic valve mean velocity, S             168   cm/s     ---------  Aortic valve VTI, S                       51.9  cm       ---------  Aortic mean gradient, S                   13    mm Hg    ---------  Aortic peak gradient, S  23    mm Hg    ---------  VTI ratio, LVOT/AV                        0.37            ---------  Aortic valve area, VTI                    1.27  cm^2     ---------  Aortic valve area/bsa, VTI                0.61  cm^2/m^2 ---------  Velocity ratio, peak, LVOT/AV             0.39           ---------  Aortic valve area, peak velocity          1.33  cm^2     ---------  Aortic valve area/bsa, peak               0.64  cm^2/m^2 ---------  velocity  Velocity ratio, mean, LVOT/AV             0.4            ---------  Aortic valve area, mean velocity          1.39  cm^2     ---------  Aortic valve area/bsa, mean               0.67  cm^2/m^2 ---------  velocity    Aorta                                     Value          Reference  Aortic root ID, ED                        37    mm       ---------    Left atrium                               Value          Reference  LA ID, A-P, ES                            41    mm       ---------  LA ID/bsa, A-P                            1.97  cm/m^2   <=2.2    Mitral valve                              Value          Reference  Mitral E-wave peak velocity               49    cm/s     ---------  Mitral A-wave peak velocity               66    cm/s     ---------  Mitral deceleration time          (H)  239   ms       150 - 230  Mitral pressure half-time                 70    ms       ---------  Mitral E/A ratio, peak                    0.7            ---------  Mitral valve area, PHT, DP                3.14  cm^2     ---------  Mitral valve area/bsa, PHT, DP            1.51  cm^2/m^2 ---------    Pulmonary arteries                        Value          Reference  PA pressure, S, DP                        28    mm Hg    <=30    Tricuspid valve                           Value          Reference  Tricuspid regurg peak velocity            251   cm/s     ---------  Tricuspid peak RV-RA gradient             25    mm Hg    ---------  Tricuspid maximal regurg                  251   cm/s     ---------  velocity, PISA    Systemic veins                             Value          Reference  Estimated CVP                             3     mm Hg    ---------    Right ventricle                           Value          Reference  RV pressure, S, DP                        28    mm Hg    <=30  Legend: (L)  and  (H)  mark values outside specified reference range.  ------------------------------------------------------------------- Prepared and Electronically Authenticated by  Jenkins Rouge, M.D. 2018-09-19T12:05:52    CHEST  2 VIEW  COMPARISON:  04/16/2017.  FINDINGS: the small bilateral pleural effusions with bibasilar chronic atelectasis or scarring is similar to prior. The cardio pericardial silhouette is enlarged. Median sternotomy wires with cardiac valve replacement again noted. The visualized bony structures of the thorax are intact.  IMPRESSION: No substantial interval change in exam.   Electronically Signed   By: Verda Cumins.D.  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  Clinically the patient looks remarkably good and his blood pressure appears to be under excellent control.   Plan:  I have encouraged the patient to continue to gradually increase his physical activity as tolerated but I have reminded him to refrain from any heavy lifting or strenuous use of his arms or shoulders. I think he may resume driving an automobile. I have encouraged him to enroll and participate in outpatient cardiac rehabilitation program. We have discussed how important it will be for him to continue to abstain from any cigarette smoking. We have also discussed how important will be to keep  and on his blood pressure and keep it under very tight control. We will have the patient return for routine follow-up CT angiogram in approximately 3 months. He will call and return sooner should specific problems or questions arise.    Valentina Gu. Roxy Manns, MD 05/10/2017 1:11 PM

## 2017-05-11 ENCOUNTER — Telehealth (HOSPITAL_COMMUNITY): Payer: Self-pay

## 2017-05-11 NOTE — Telephone Encounter (Signed)
I called and spoke to patient about scheduling for cardiac rehab. Patient is interested, but asked that I call back next week, on Friday 05/21/17 to schedule. I confirmed with patient wife that I will call next week, 05/21/17.

## 2017-05-11 NOTE — Addendum Note (Signed)
Addended by: Fanny Bien R on: 05/11/2017 09:27 AM   Modules accepted: Orders

## 2017-05-12 ENCOUNTER — Telehealth: Payer: Self-pay | Admitting: *Deleted

## 2017-05-12 NOTE — Telephone Encounter (Signed)
Received Physician Orders from [facility]; forwarded to provider/SLS 10/03

## 2017-05-13 NOTE — Telephone Encounter (Signed)
Orders signed and faxed to [facility]- [phone]. Form sent for scanning.

## 2017-05-14 ENCOUNTER — Telehealth: Payer: Self-pay | Admitting: *Deleted

## 2017-05-14 ENCOUNTER — Other Ambulatory Visit: Payer: Self-pay | Admitting: Internal Medicine

## 2017-05-14 MED ORDER — FLECAINIDE ACETATE 100 MG PO TABS: 100.0000 mg | ORAL_TABLET | Freq: Two times a day (BID) | ORAL | 3 refills | Status: DC

## 2017-05-14 NOTE — Telephone Encounter (Signed)
Received Episode Detail Report from [facility] forwarded to provider/SLS 10/05

## 2017-05-17 MED ORDER — FLECAINIDE ACETATE 100 MG PO TABS: 100.0000 mg | ORAL_TABLET | Freq: Two times a day (BID) | ORAL | 3 refills | Status: DC

## 2017-05-17 NOTE — Addendum Note (Signed)
Addended by: Juventino Slovak on: 05/17/2017 02:52 PM   Modules accepted: Orders

## 2017-05-18 ENCOUNTER — Encounter: Payer: Self-pay | Admitting: Internal Medicine

## 2017-05-18 ENCOUNTER — Ambulatory Visit (INDEPENDENT_AMBULATORY_CARE_PROVIDER_SITE_OTHER): Payer: Medicare Other | Admitting: Internal Medicine

## 2017-05-18 VITALS — BP 124/62 | HR 72 | Temp 98.2°F | Resp 14 | Ht 73.0 in | Wt 173.4 lb

## 2017-05-18 DIAGNOSIS — F419 Anxiety disorder, unspecified: Secondary | ICD-10-CM | POA: Diagnosis not present

## 2017-05-18 DIAGNOSIS — Z23 Encounter for immunization: Secondary | ICD-10-CM | POA: Diagnosis not present

## 2017-05-18 DIAGNOSIS — F32A Depression, unspecified: Secondary | ICD-10-CM

## 2017-05-18 DIAGNOSIS — I10 Essential (primary) hypertension: Secondary | ICD-10-CM

## 2017-05-18 DIAGNOSIS — G4700 Insomnia, unspecified: Secondary | ICD-10-CM | POA: Diagnosis not present

## 2017-05-18 DIAGNOSIS — Z5181 Encounter for therapeutic drug level monitoring: Secondary | ICD-10-CM | POA: Diagnosis not present

## 2017-05-18 DIAGNOSIS — F329 Major depressive disorder, single episode, unspecified: Secondary | ICD-10-CM | POA: Diagnosis not present

## 2017-05-18 DIAGNOSIS — Z9889 Other specified postprocedural states: Secondary | ICD-10-CM

## 2017-05-18 MED ORDER — ESCITALOPRAM OXALATE 10 MG PO TABS
10.0000 mg | ORAL_TABLET | Freq: Every day | ORAL | 6 refills | Status: DC
Start: 2017-05-18 — End: 2017-12-13

## 2017-05-18 MED ORDER — ALPRAZOLAM 1 MG PO TABS
1.0000 mg | ORAL_TABLET | Freq: Every day | ORAL | 1 refills | Status: DC
Start: 2017-05-18 — End: 2017-08-11

## 2017-05-18 NOTE — Progress Notes (Signed)
Pre visit review using our clinic review tool, if applicable. No additional management support is needed unless otherwise documented below in the visit note. 

## 2017-05-18 NOTE — Patient Instructions (Signed)
GO TO THE LAB :  Provide a urine sample   GO TO THE FRONT DESK Schedule your next appointment for a  Check up in 4 weeks

## 2017-05-18 NOTE — Progress Notes (Signed)
Subjective:    Patient ID: Sean Riley, male    DOB: 10/03/1950, 66 y.o.   MRN: [id number]  DOS:  05/18/2017 Type of visit - description : f/u Interval history: Since last visit, saw cards, BB dose Increased for better BP control. No ambulatory BPs Saw cardiovascular surgery, got good reports. He was noted to be somewhat depressed, started a low dose of Lexapro, Xanax. Good compliance and tolerance. Better? Patient states "all I do is watch TV all day and then go to sleep". Not really better. Denies suicidal ideas  Wt Readings from Last 3 Encounters:  05/18/17 173 lb 6 oz (78.6 kg)  05/10/17 172 lb (78 kg)  05/04/17 175 lb (79.4 kg)     Review of Systems Appetite decrease, denies postprandial pain.   Past Medical History:  Diagnosis Date  . Allergy   . Arthritis   . Ascending aortic dissection ([place]) 03/23/2017  . Atrial fibrillation ([place])   . Cataract    removed both eyes   . GERD (gastroesophageal reflux disease)   . HYPERLIPIDEMIA   . 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 Hypertension   . Microscopic hematuria    negative cystoscopy  . NONSPECIFIC ABNORMAL ELECTROCARDIOGRAM   . S/P aortic dissection repair 03/24/2017   Biological Bentall aortic root replacement + resection and grafting of entire ascending aorta, transverse aortic arch and proximal descending thoracic aorta with elephant trunk distal anastomosis and debranching of aortic arch vessels  . 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 Varicose veins     Past Surgical History:  Procedure Laterality Date  . APPENDECTOMY    . CARDIAC CATHETERIZATION      X3;Dr Caryl Comes, last in July, 2013  . COLONOSCOPY    . CORONARY ANGIOPLASTY     04/24/13 : he denies angioplasty  . CYSTOSCOPY  1978   Dr  Hartley Barefoot  . KNEE ARTHROSCOPY  2012   Dr Theda Sers  . LUMBAR LAMINECTOMY  1987   Dr Durward Fortes  . MYELOGRAM  2007  . PERICARDIOCENTESIS N/A 04/07/2017   Procedure: PERICARDIOCENTESIS;  Surgeon: Sherren Mocha, MD;  Location: [place] [facility];  Service: Cardiovascular;  Laterality: N/A;  . POLYPECTOMY    . REPAIR OF ACUTE ASCENDING THORACIC AORTIC DISSECTION N/A 03/23/2017   Procedure: REPAIR OF ACUTE ASCENDING THORACIC AORTIC DISSECTION.  Bentall procedure.  Aortic root repleacement with bioprosthetic valve.  Reimplantation of left and right coronary arteries.  Total resection of transverse aortic arch.  Elephant trunk distal anastomosis and debranching of arch vessels.;  Surgeon: Rexene Alberts, MD;  Location: [facility];  Service: Vascular;  Laterality: N/A;  . ROTATOR CUFF REPAIR    . SHOULDER ARTHROSCOPY  2011   Dr Theda Sers  . SHOULDER ARTHROSCOPY Right 08/2015  . SPINAL FUSION  1986   Dr Rolin Barry  . TEE WITHOUT CARDIOVERSION N/A 03/23/2017   Procedure: TRANSESOPHAGEAL ECHOCARDIOGRAM (TEE);  Surgeon: Rexene Alberts, MD;  Location: [place];  Service: Open Heart Surgery;  Laterality: N/A;  . TRACHEOSTOMY     age 25 for croup    Social History   Social History  . Marital status: Married    Spouse name: N/A  . Number of children: 2  . Years of education: N/A   Occupational History  .  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 Smoking status: Former Smoker    Packs/day: 1.50    Years: 30.00    Types: Cigarettes  . Smokeless tobacco: Never Used     Comment: 1 ppd   . Alcohol use 0.0 oz/week     Comment: 6-7 cans of beer daily  . Drug use: No  . Sexual activity: Not on file   Other Topics Concern  . Not on file   Social History Narrative   Lives w/ wife      Allergies as of 05/18/2017      Reactions   Simvastatin Other (See Comments)   Mental status changes   Celecoxib Other (See Comments)   GI UPSET AND INFLAMMATION   Codeine Other (See Comments)   GI  UPSET AND INFLAMMATION   Nsaids Other (See Comments)   GI UPSET AND INFLAMMATION (can tolerate via IV)   Tape Other (See Comments)   Medical tape and Band-Aids PULL OFF THE SKIN; please use Coban wrap   Cephalexin Itching, Rash   Latex Rash      Medication List       Accurate as of 05/18/17 11:59 PM. Always use your most recent med list.          albuterol 108 (90 Base) MCG/ACT inhaler Commonly known as:  PROAIR HFA Inhale two puffs every four to six hours as needed for cough or wheeze.   albuterol (2.5 MG/3ML) 0.083% nebulizer solution Commonly known as:  PROVENTIL Take 3 mLs (2.5 mg total) by nebulization every 8 (eight) hours as needed for wheezing or shortness of breath.   ALPRAZolam 1 MG tablet Commonly known as:  XANAX Take 1-1.5 tablets (1-1.5 mg total) by mouth at bedtime.   aspirin 325 MG EC tablet Take 1 tablet (325 mg total) by mouth daily.   azelastine 0.1 % nasal spray Commonly known as:  ASTELIN Can use two sprays in each nostril twice daily as directed.   calcipotriene-betamethasone ointment Commonly known as:  TACLONEX Apply 1 application topically daily as needed (for eczema).   escitalopram 10 MG tablet Commonly known as:  LEXAPRO Take 1 tablet (10 mg total) by mouth daily.   esomeprazole 40 MG capsule Commonly known as:  NEXIUM Take 1 capsule (40 mg total) by mouth daily before breakfast.   ferrous sulfate 325 (65 FE) MG tablet Take 1 tablet (325 mg total) by mouth daily. For one month then stop.   flecainide 100 MG tablet Commonly known as:  TAMBOCOR Take 1 tablet (100 mg total) by mouth 2 (two) times daily.   fluticasone 50 MCG/ACT nasal spray Commonly known as:  FLONASE Use one spray in each nostril twice daily.   furosemide 40 MG tablet Commonly known as:  LASIX Take 1 tablet (40 mg total) by mouth daily as needed.   ipratropium 0.06 % nasal spray Commonly known as:  ATROVENT Can use two sprays in each nostril every six hours as  needed to dry up nose.   metoprolol tartrate 25 MG tablet Commonly known as:  LOPRESSOR Take 1 tablet (25 mg total) by mouth 2 (two) times daily.   potassium chloride SA 20 MEQ tablet Commonly known as:  K-DUR,KLOR-CON Take 1 tablet (20 mEq total) by mouth daily as needed.   rosuvastatin 5 MG tablet Commonly known as:  CRESTOR Take 5 mg by mouth every other day.   traMADol 50 MG tablet Commonly known as:  Veatrice Bourbon  Take 50 mg by mouth every 4-6 hours PRN severe pain.          Objective:   Physical Exam BP 124/62 (BP Location: Left Arm, Patient Position: Sitting, Cuff Size: Small)   Pulse 72   Temp 98.2 F (36.8 C) (Oral)   Resp 14   Ht 6\' 1"  (1.854 m)   Wt 173 lb 6 oz (78.6 kg)   SpO2 97%   BMI 22.87 kg/m  General:   Well developed, well nourished . NAD.  HEENT:  Normocephalic . Face symmetric, atraumatic Lungs:  Decreased breath sounds otherwise clear Normal respiratory effort, no intercostal retractions, no accessory muscle use. Heart: RRR,  mild systolic murmur.  No pretibial edema bilaterally  Skin: Not pale. Not jaundice Neurologic:  alert & oriented X3.  Speech normal, gait appropriate for age and unassisted Psych--  Cognition and judgment appear intact.  Cooperative with normal attention span and concentration.  Behavior appropriate.  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 Tried a cpap. [place] (-) 07-2015 CV: --P- Atrial fibrillation Dr Caryl Comes, on NSR, no anticoag --lexiscan 2013 --Cardiac catheterization July 2013: Normal LV FX,, mild nonobstructive CAD Dr. Angelena Form --thoracis Ao  dissection , acute, surgery 03-2017 GU: BPH, microscopic hematuria (negative cystoscopy 1978) Elevated PSA: saw urology   04-2016 Gynecomastia, Dr. Loanne Drilling  >> Bx (-) 10-2014 ; took tamoxifen temporarily (self d/c 06-2016), improved Hypogonadism-- dx per Dr Loanne Drilling, numbers improved d/t nolvadex  Sees dermatology  H/o  Tracheostomy , age 55, croup  PLAN: Thoracic aneurysm repair: Seems to be recovering well. To start cardiac rehabilitation in 2 weeks HTN: Since the last visit, cardiology increased metoprolol dose. He is also on Lasix, potassium. Recommend to check ambulatory BPs. Recheck BMP on RTC Depression, insomnia: Started Lexapro, no major improvement, patient is counseled to the best of my ability. Increase Lexapro to 10 mg, refill Xanax, UDS and contract Lack of appetite: Reassess once depression is better. No weight loss  Requests flu shot: done  RTC 4 weeks.

## 2017-05-19 ENCOUNTER — Telehealth: Payer: Self-pay | Admitting: Internal Medicine

## 2017-05-19 NOTE — Telephone Encounter (Signed)
[facility] calling to make sure Dr. Caryl Comes is aware of the interaction between Escitalopram 10 mg tablet taken daily, that Dr. Larose Kells prescribe and Flecainide 100 mg tablet, taken daily, that Dr. Caryl Comes prescribe. Please address

## 2017-05-19 NOTE — Telephone Encounter (Signed)
°*  STAT* If patient is at the pharmacy, call can be transferred to refill team.   1. Which medications need to be refilled? (please list name of each medication and dose if known) Flecainide Acet Tab 100 mg   2. Which pharmacy/location (including street and city if local pharmacy) is medication to be sent to?[facility]   3. Do they need a 30 day or 90 day supply? [place]

## 2017-05-19 NOTE — Assessment & Plan Note (Signed)
Thoracic aneurysm repair: Seems to be recovering well. To start cardiac rehabilitation in 2 weeks HTN: Since the last visit, cardiology increased metoprolol dose. He is also on Lasix, potassium. Recommend to check ambulatory BPs. Recheck BMP on RTC Depression, insomnia: Started Lexapro, no major improvement, patient is counseled to the best of my ability. Increase Lexapro to 10 mg, refill Xanax, UDS and contract Lack of appetite: Reassess once depression is better. No weight loss  Requests flu shot: done  RTC 4 weeks.

## 2017-05-21 ENCOUNTER — Telehealth (HOSPITAL_COMMUNITY): Payer: Self-pay

## 2017-05-21 NOTE — Telephone Encounter (Signed)
Called patient to discuss Cardiac Rehab. No voicemail is set up to leave message. Will F/U

## 2017-05-22 LAB — PAIN MGMT, PROFILE 8 W/CONF, U
6 Acetylmorphine: NEGATIVE ng/mL (ref <10)
ALCOHOL METABOLITES: NEGATIVE ng/mL (ref <500)
ALPHAHYDROXYALPRAZOLAM: 532 ng/mL — AB (ref <25)
AMINOCLONAZEPAM: NEGATIVE ng/mL (ref <25)
Alphahydroxymidazolam: NEGATIVE ng/mL (ref <50)
Alphahydroxytriazolam: NEGATIVE ng/mL (ref <50)
Amphetamines: NEGATIVE ng/mL (ref <500)
BUPRENORPHINE, URINE: NEGATIVE ng/mL (ref <5)
Benzodiazepines: POSITIVE ng/mL — AB (ref <100)
COCAINE METABOLITE: NEGATIVE ng/mL (ref <150)
CREATININE: 182.3 mg/dL
Hydroxyethylflurazepam: NEGATIVE ng/mL (ref <50)
LORAZEPAM: NEGATIVE ng/mL (ref <50)
MDMA: NEGATIVE ng/mL (ref <500)
Marijuana Metabolite: NEGATIVE ng/mL (ref <20)
Nordiazepam: NEGATIVE ng/mL (ref <50)
OXIDANT: NEGATIVE ug/mL (ref <200)
Opiates: NEGATIVE ng/mL (ref <100)
Oxazepam: NEGATIVE ng/mL (ref <50)
Oxycodone: NEGATIVE ng/mL (ref <100)
PH: 7.28 (ref 4.5–9.0)
TEMAZEPAM: NEGATIVE ng/mL (ref <50)

## 2017-05-26 NOTE — Telephone Encounter (Signed)
Wife of patient Sean Riley) returned phone call in regards to Cardiac Rehab. Patient is interested in program. Scheduled orientation for 06/17/17 at 1:30pm. Patient will be attending the 11:15am exercise class.

## 2017-06-03 NOTE — Telephone Encounter (Signed)
EKG performed while taking both medications

## 2017-06-03 NOTE — Telephone Encounter (Signed)
Supple, Harlon Flor, RPH  Stanton Kidney, RN        Are either of the medications new? I would see if pt has had an EKG checked since being on both medications. They aren't completely contraindicated but there is definitely a risk of QTc prolongation in using both medications together.   Thanks,  Megan   Previous Messages    ----- Message -----  From: Stanton Kidney, RN  Sent: 05/20/2017  8:45 AM  To: Harlon Flor Supple, RPH  Subject: Drug interaction                 I am doing follow up for Dr. Othella Boyer, RN.  In his folder there is a drug utilization clarification request:  Flecainide 100 mg BID  Escitalopram 10 mg daily   I see that it is a classification D interaction. I am not sure if this should be addressed sooner rather than later being that Dr. Caryl Comes is out [place] and I am not sure how long it has been in his folder.  Can you let me know if I need to speak w/ physician about med change due to the D interaction.   Thx  Trinidad Curet, RN  :)

## 2017-06-14 ENCOUNTER — Ambulatory Visit (INDEPENDENT_AMBULATORY_CARE_PROVIDER_SITE_OTHER): Payer: Medicare Other | Admitting: Internal Medicine

## 2017-06-14 ENCOUNTER — Encounter: Payer: Self-pay | Admitting: Internal Medicine

## 2017-06-14 ENCOUNTER — Telehealth: Payer: Self-pay | Admitting: Internal Medicine

## 2017-06-14 VITALS — BP 128/78 | HR 57 | Temp 98.0°F | Resp 14 | Ht 73.0 in | Wt 172.5 lb

## 2017-06-14 DIAGNOSIS — F419 Anxiety disorder, unspecified: Secondary | ICD-10-CM

## 2017-06-14 DIAGNOSIS — R630 Anorexia: Secondary | ICD-10-CM | POA: Diagnosis not present

## 2017-06-14 DIAGNOSIS — F329 Major depressive disorder, single episode, unspecified: Secondary | ICD-10-CM | POA: Diagnosis not present

## 2017-06-14 DIAGNOSIS — I10 Essential (primary) hypertension: Secondary | ICD-10-CM | POA: Diagnosis not present

## 2017-06-14 DIAGNOSIS — E785 Hyperlipidemia, unspecified: Secondary | ICD-10-CM | POA: Diagnosis not present

## 2017-06-14 DIAGNOSIS — R609 Edema, unspecified: Secondary | ICD-10-CM | POA: Diagnosis not present

## 2017-06-14 DIAGNOSIS — E871 Hypo-osmolality and hyponatremia: Secondary | ICD-10-CM

## 2017-06-14 DIAGNOSIS — D649 Anemia, unspecified: Secondary | ICD-10-CM

## 2017-06-14 LAB — CBC WITH DIFFERENTIAL/PLATELET
BASOS ABS: 0 10*3/uL (ref 0.0–0.1)
Basophils Relative: 0.6 % (ref 0.0–3.0)
EOS PCT: 6.5 % — AB (ref 0.0–5.0)
Eosinophils Absolute: 0.4 10*3/uL (ref 0.0–0.7)
HEMATOCRIT: 35.6 % — AB (ref 39.0–52.0)
Hemoglobin: 11.6 g/dL — ABNORMAL LOW (ref 13.0–17.0)
LYMPHS ABS: 0.8 10*3/uL (ref 0.7–4.0)
LYMPHS PCT: 12.5 % (ref 12.0–46.0)
MCHC: 32.6 g/dL (ref 30.0–36.0)
MCV: 84.4 fl (ref 78.0–100.0)
MONOS PCT: 11.6 % (ref 3.0–12.0)
Monocytes Absolute: 0.7 10*3/uL (ref 0.1–1.0)
NEUTROS ABS: 4.2 10*3/uL (ref 1.4–7.7)
NEUTROS PCT: 68.8 % (ref 43.0–77.0)
Platelets: 318 10*3/uL (ref 150.0–400.0)
RBC: 4.22 Mil/uL (ref 4.22–5.81)
RDW: 14.9 % (ref 11.5–15.5)
WBC: 6.1 10*3/uL (ref 4.0–10.5)

## 2017-06-14 LAB — BASIC METABOLIC PANEL
BUN: 9 mg/dL (ref 6–23)
CALCIUM: 9.4 mg/dL (ref 8.4–10.5)
CO2: 28 mEq/L (ref 19–32)
CREATININE: 0.8 mg/dL (ref 0.40–1.50)
Chloride: 94 mEq/L — ABNORMAL LOW (ref 96–112)
GFR: 102.31 mL/min (ref >60.00)
Glucose, Bld: 77 mg/dL (ref 70–99)
Potassium: 4.2 mEq/L (ref 3.5–5.1)
SODIUM: 129 meq/L — AB (ref 135–145)

## 2017-06-14 LAB — LIPID PANEL
Cholesterol: 145 mg/dL (ref 0–200)
HDL: 34.4 mg/dL — AB (ref >39.00)
LDL Cholesterol: 93 mg/dL (ref 0–99)
NONHDL: 110.29
TRIGLYCERIDES: 88 mg/dL (ref 0.0–149.0)
Total CHOL/HDL Ratio: 4
VLDL: 17.6 mg/dL (ref 0.0–40.0)

## 2017-06-14 MED ORDER — MEGESTROL ACETATE 400 MG/10ML PO SUSP: 400.0000 mg | Freq: Every day | ORAL | 0 refills | Status: DC

## 2017-06-14 NOTE — Patient Instructions (Signed)
GO TO THE LAB : Get the blood work     GO TO THE FRONT DESK Schedule your next appointment for a  Check up in 3 months     Check the  blood pressure 2 or 3 times a   Week after you rest x 15 minutes , use a large cuff Be sure your blood pressure is between 110/65 and  135/85. If it is consistently higher or lower, let me know

## 2017-06-14 NOTE — Progress Notes (Signed)
Subjective:    Patient ID: Sean Riley, male    DOB: 10-16-1950, 66 y.o.   MRN: [id number]  DOS:  06/14/2017 Type of visit - description : rov here with his wife Interval history: HTN: Good compliance with medication, ambulatory BPs sometimes elevated in the 140s, 606T with diastolic of 86. Depression, anxiety: Definitely improved with Xanax and Lexapro.  No apparent side effects. Decreased appetite: Ongoing problem, denies any abd  pain when he swallows, simply things does not have a good taste and he is not hungry.  He has not lost weight. Edema: Not an issue, has not needed Lasix Tobacco: Still free of tobacco, having some cravings.   Wt Readings from Last 3 Encounters:  06/14/17 172 lb 8 oz (78.2 kg)  05/18/17 173 lb 6 oz (78.6 kg)  05/10/17 172 lb (78 kg)   BP Readings from Last 3 Encounters:  06/14/17 128/78  05/18/17 124/62  05/10/17 119/71     Review of Systems   Past Medical History:  Diagnosis Date  . Allergy   . Arthritis   . Ascending aortic dissection ([place]) 03/23/2017  . Atrial fibrillation ([place])   . Cataract    removed both eyes   . GERD (gastroesophageal reflux disease)   . HYPERLIPIDEMIA   . 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 Hypertension   . Microscopic hematuria    negative cystoscopy  . NONSPECIFIC ABNORMAL ELECTROCARDIOGRAM   . S/P aortic dissection repair 03/24/2017   Biological Bentall aortic root replacement + resection and grafting of entire ascending aorta, transverse aortic arch and proximal descending thoracic aorta with elephant trunk distal anastomosis and debranching of aortic arch vessels  . 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 Varicose veins     Past Surgical History:  Procedure Laterality Date  . APPENDECTOMY      . CARDIAC CATHETERIZATION      X3;Dr Caryl Comes, last in July, 2013  . COLONOSCOPY    . CORONARY ANGIOPLASTY     04/24/13 : he denies angioplasty  . CYSTOSCOPY  1978   Dr Hartley Barefoot  . KNEE ARTHROSCOPY  2012   Dr Theda Sers  . LUMBAR LAMINECTOMY  1987   Dr Durward Fortes  . MYELOGRAM  2007  . POLYPECTOMY    . ROTATOR CUFF REPAIR    . SHOULDER ARTHROSCOPY  2011   Dr Theda Sers  . SHOULDER ARTHROSCOPY Right 08/2015  . SPINAL FUSION  1986   Dr Rolin Barry  . TRACHEOSTOMY     age 53 for croup    Social History   Socioeconomic History  . Marital status: Married    Spouse name: Not on file  . Number of children: 2  . Years of education: Not on file  . Highest education level: Not on file  Social Needs  . Financial resource strain: Not on file  . Food insecurity - worry: Not on file  . Food insecurity - inability: Not on file  . Transportation needs - medical: Not on file  . Transportation needs - non-medical: Not on file  Occupational History  . 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 Smoking status: Former Smoker    Packs/day: 2.00    Years: 30.00    Pack years: 60.00    Types:  Cigarettes  . Smokeless tobacco: Never Used  . Tobacco comment: 2 ppd , quit  Substance and Sexual Activity  . Alcohol use: Yes    Alcohol/week: 0.0 oz    Comment: 6-7 cans of beer daily  . Drug use: No  . Sexual activity: Not on file  Other Topics Concern  . Not on file  Social History Narrative   Lives w/ wife      Allergies as of 06/14/2017      Reactions   Simvastatin Other (See Comments)   Mental status changes   Celecoxib Other (See Comments)   GI UPSET AND INFLAMMATION   Codeine Other (See Comments)   GI UPSET AND INFLAMMATION   Nsaids Other (See Comments)   GI UPSET AND INFLAMMATION (can tolerate via IV)   Tape Other (See Comments)   Medical tape and Band-Aids PULL OFF THE SKIN; please use Coban wrap   Cephalexin Itching, Rash   Latex Rash      Medication List         Accurate as of 06/14/17 11:59 PM. Always use your most recent med list.          albuterol 108 (90 Base) MCG/ACT inhaler Commonly known as:  PROAIR HFA Inhale two puffs every four to six hours as needed for cough or wheeze.   albuterol (2.5 MG/3ML) 0.083% nebulizer solution Commonly known as:  PROVENTIL Take 3 mLs (2.5 mg total) by nebulization every 8 (eight) hours as needed for wheezing or shortness of breath.   ALPRAZolam 1 MG tablet Commonly known as:  XANAX Take 1-1.5 tablets (1-1.5 mg total) by mouth at bedtime.   aspirin 325 MG EC tablet Take 1 tablet (325 mg total) by mouth daily.   azelastine 0.1 % nasal spray Commonly known as:  ASTELIN Can use two sprays in each nostril twice daily as directed.   calcipotriene-betamethasone ointment Commonly known as:  TACLONEX Apply 1 application topically daily as needed (for eczema).   escitalopram 10 MG tablet Commonly known as:  LEXAPRO Take 1 tablet (10 mg total) by mouth daily.   esomeprazole 40 MG capsule Commonly known as:  NEXIUM Take 1 capsule (40 mg total) by mouth daily before breakfast.   flecainide 100 MG tablet Commonly known as:  TAMBOCOR Take 1 tablet (100 mg total) by mouth 2 (two) times daily.   fluticasone 50 MCG/ACT nasal spray Commonly known as:  FLONASE Use one spray in each nostril twice daily.   furosemide 40 MG tablet Commonly known as:  LASIX Take 1 tablet (40 mg total) by mouth daily as needed.   ipratropium 0.06 % nasal spray Commonly known as:  ATROVENT Can use two sprays in each nostril every six hours as needed to dry up nose.   megestrol 400 MG/10ML suspension Commonly known as:  MEGACE Take 10 mLs (400 mg total) daily by mouth.   metoprolol tartrate 25 MG tablet Commonly known as:  LOPRESSOR Take 1 tablet (25 mg total) by mouth 2 (two) times daily.   potassium chloride SA 20 MEQ tablet Commonly known as:  K-DUR,KLOR-CON Take 1 tablet (20 mEq total) by mouth daily as needed.     rosuvastatin 5 MG tablet Commonly known as:  CRESTOR Take 5 mg by mouth every other day.          Objective:   Physical Exam BP 128/78 (BP Location: Left Arm, Patient Position: Sitting, Cuff Size: Small)   Pulse (!) 57   Temp 98 F (36.7  C) (Oral)   Resp 14   Ht 6\' 1"  (1.854 m)   Wt 172 lb 8 oz (78.2 kg)   SpO2 92%   BMI 22.76 kg/m  General:   Well developed, well nourished . NAD.  HEENT:  Normocephalic . Face symmetric, atraumatic Lungs:  CTA B Normal respiratory effort, no intercostal retractions, no accessory muscle use. Heart: RRR, mild systolic murmur.  No pretibial edema bilaterally  Skin: Not pale. Not jaundice Neurologic:  alert & oriented X3.  Speech normal, gait appropriate, assisted by a cane Psych--  Cognition and judgment appear intact.  Cooperative with normal attention span and concentration.  Behavior appropriate. No anxious or depressed appearing.  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 Tried a cpap. [place] (-) 07-2015 CV: --P- Atrial fibrillation Dr Caryl Comes, on NSR, no anticoag --lexiscan 2013 --Cardiac catheterization July 2013: Normal LV FX,, mild nonobstructive CAD Dr. Angelena Form --thoracis Ao  dissection , acute, surgery 03-2017 GU: BPH, microscopic hematuria (negative cystoscopy 1978) Elevated PSA: saw urology  04-2016 Gynecomastia, Dr. Loanne Drilling  >> Bx (-) 10-2014 ; took tamoxifen temporarily (self d/c 06-2016), improved Hypogonadism-- dx per Dr Loanne Drilling, numbers improved d/t nolvadex  Sees dermatology  H/o  Tracheostomy , age 33, croup  PLAN: Thoracic aneurysm repair: Seems to be recovering physically, slightly more stronger and more active than before.  Postop anemia: Rechecking a CBC today HTN: Currently on  Lopressor, BPs at the office has all been very good.  At home they are occasionally high in the 140, 150.  Recommend to continue monitoring with a large cuff and after waiting 15 minutes.  Check a BMP. High cholesterol: On Crestor, check a FLP.  Recent LFTs normal. Anxiety depression: At the last visit we increased Lexapro to 10 mg daily, also on Xanax at bedtime.  That seems to be working well for him.  He seems very relaxed today but not  sleepy.  Reassess  SSRIs dose periodically. Lack of appetite: Still an issue, extensive discussion about prescribing a medication, eventually we agreed to try Megace daily, call if for a refill if they feel that is helping. Tobacco abuse: Still tobacco free, having some cravings. Edema: Not an issue, not taking Lasix and potassium but have them in case he needs them RTC 3 months    Today, I spent more than  25  min with the patient: >50% of the time counseling regards importance of keeping BP controlled, reviewing the chart, talking about the pros and cons of prescribing a medication to increase his appetite.  Answering multiple questions

## 2017-06-14 NOTE — Progress Notes (Signed)
Pre visit review using our clinic review tool, if applicable. No additional management support is needed unless otherwise documented below in the visit note. 

## 2017-06-14 NOTE — Telephone Encounter (Signed)
At check out pt's spouse wanted to make PCP aware that they're email system is down so they would like a call when results are back instead of a mychart message.

## 2017-06-14 NOTE — Telephone Encounter (Signed)
Noted  

## 2017-06-15 NOTE — Assessment & Plan Note (Signed)
Thoracic aneurysm repair: Seems to be recovering physically, slightly more stronger and more active than before.  Postop anemia: Rechecking a CBC today HTN: Currently on Lopressor, BPs at the office has all been very good.  At home they are occasionally high in the 140, 150.  Recommend to continue monitoring with a large cuff and after waiting 15 minutes.  Check a BMP. High cholesterol: On Crestor, check a FLP.  Recent LFTs normal. Anxiety depression: At the last visit we increased Lexapro to 10 mg daily, also on Xanax at bedtime.  That seems to be working well for him.  He seems very relaxed today but not  sleepy.  Reassess  SSRIs dose periodically. Lack of appetite: Still an issue, extensive discussion about prescribing a medication, eventually we agreed to try Megace daily, call if for a refill if they feel that is helping. Tobacco abuse: Still tobacco free, having some cravings. Edema: Not an issue, not taking Lasix and potassium but have them in case he needs them RTC 3 months

## 2017-06-16 ENCOUNTER — Telehealth (HOSPITAL_COMMUNITY): Payer: Self-pay

## 2017-06-17 ENCOUNTER — Encounter (HOSPITAL_COMMUNITY): Payer: Self-pay

## 2017-06-17 ENCOUNTER — Encounter (HOSPITAL_COMMUNITY)
Admission: RE | Admit: 2017-06-17 | Discharge: 2017-06-17 | Disposition: A | Discharge status: Home / Self Care | Payer: Medicare Other | Source: Ambulatory Visit | Attending: Internal Medicine | Admitting: Internal Medicine

## 2017-06-17 VITALS — BP 138/80 | HR 60 | Ht 71.0 in | Wt 169.5 lb

## 2017-06-17 DIAGNOSIS — E785 Hyperlipidemia, unspecified: Secondary | ICD-10-CM | POA: Insufficient documentation

## 2017-06-17 DIAGNOSIS — Z79899 Other long term (current) drug therapy: Secondary | ICD-10-CM | POA: Insufficient documentation

## 2017-06-17 DIAGNOSIS — Z7982 Long term (current) use of aspirin: Secondary | ICD-10-CM | POA: Insufficient documentation

## 2017-06-17 DIAGNOSIS — I10 Essential (primary) hypertension: Secondary | ICD-10-CM | POA: Insufficient documentation

## 2017-06-17 DIAGNOSIS — Z953 Presence of xenogenic heart valve: Secondary | ICD-10-CM | POA: Insufficient documentation

## 2017-06-17 DIAGNOSIS — I4891 Unspecified atrial fibrillation: Secondary | ICD-10-CM | POA: Insufficient documentation

## 2017-06-17 DIAGNOSIS — Z952 Presence of prosthetic heart valve: Secondary | ICD-10-CM

## 2017-06-17 DIAGNOSIS — K219 Gastro-esophageal reflux disease without esophagitis: Secondary | ICD-10-CM | POA: Insufficient documentation

## 2017-06-17 DIAGNOSIS — Z7951 Long term (current) use of inhaled steroids: Secondary | ICD-10-CM | POA: Insufficient documentation

## 2017-06-17 DIAGNOSIS — M1990 Unspecified osteoarthritis, unspecified site: Secondary | ICD-10-CM | POA: Insufficient documentation

## 2017-06-17 DIAGNOSIS — Z87891 Personal history of nicotine dependence: Secondary | ICD-10-CM | POA: Insufficient documentation

## 2017-06-17 NOTE — Progress Notes (Signed)
Cardiac Individual Treatment Plan  Patient Details  Name: Sean Riley MRN: [id number] Date of Birth: 04-08-1951 Referring Provider:     CARDIAC REHAB PHASE II ORIENTATION from 06/17/2017 in [place]  Referring Provider  Verdie Mosher MD      Initial Encounter Date:    CARDIAC REHAB PHASE II ORIENTATION from 06/17/2017 in [place]  Date  06/17/17  Referring Provider  Verdie Mosher MD      Visit Diagnosis: S/P AVR (aortic valve replacement) and aortoplasty  Patient's Home Medications on Admission:  Current Outpatient Medications:  .  albuterol (PROAIR HFA) 108 (90 Base) MCG/ACT inhaler, Inhale two puffs every four to six hours as needed for cough or wheeze. (Patient taking differently: Inhale 2 puffs into the lungs See admin instructions. Every 4-6 hours as needed for coughing or wheezing), Disp: 3 Inhaler, Rfl: 0 .  ALPRAZolam (XANAX) 1 MG tablet, Take 1-1.5 tablets (1-1.5 mg total) by mouth at bedtime., Disp: 40 tablet, Rfl: 1 .  aspirin EC 325 MG EC tablet, Take 1 tablet (325 mg total) by mouth daily., Disp: 30 tablet, Rfl: 0 .  azelastine (ASTELIN) 0.1 % nasal spray, Can use two sprays in each nostril twice daily as directed. (Patient taking differently: Place 2 sprays into both nostrils 2 (two) times daily. ), Disp: 90 mL, Rfl: 3 .  calcipotriene-betamethasone (TACLONEX) ointment, Apply 1 application topically daily as needed (for eczema). , Disp: , Rfl:  .  escitalopram (LEXAPRO) 10 MG tablet, Take 1 tablet (10 mg total) by mouth daily., Disp: 30 tablet, Rfl: 6 .  esomeprazole (NEXIUM) 40 MG capsule, Take 1 capsule (40 mg total) by mouth daily before breakfast., Disp: 90 capsule, Rfl: 3 .  flecainide (TAMBOCOR) 100 MG tablet, Take 1 tablet (100 mg total) by mouth 2 (two) times daily., Disp: 180 tablet, Rfl: 3 .  fluticasone (FLONASE) 50 MCG/ACT nasal spray, Use one spray in each nostril twice daily., Disp: 48 g,  Rfl: 3 .  metoprolol tartrate (LOPRESSOR) 25 MG tablet, Take 1 tablet (25 mg total) by mouth 2 (two) times daily., Disp: 180 tablet, Rfl: 3 .  albuterol (PROVENTIL) (2.5 MG/3ML) 0.083% nebulizer solution, Take 3 mLs (2.5 mg total) by nebulization every 8 (eight) hours as needed for wheezing or shortness of breath. (Patient not taking: Reported on 06/17/2017), Disp: 150 mL, Rfl: 1 .  furosemide (LASIX) 40 MG tablet, Take 1 tablet (40 mg total) by mouth daily as needed. (Patient not taking: Reported on 06/14/2017), Disp: 30 tablet, Rfl: 0 .  ipratropium (ATROVENT) 0.06 % nasal spray, Can use two sprays in each nostril every six hours as needed to dry up nose. (Patient not taking: Reported on 06/17/2017), Disp: 45 mL, Rfl: 3 .  megestrol (MEGACE) 400 MG/10ML suspension, Take 10 mLs (400 mg total) daily by mouth. (Patient not taking: Reported on 06/17/2017), Disp: 480 mL, Rfl: 0 .  potassium chloride SA (K-DUR,KLOR-CON) 20 MEQ tablet, Take 1 tablet (20 mEq total) by mouth daily as needed. (Patient not taking: Reported on 06/14/2017), Disp: 30 tablet, Rfl: 0 .  rosuvastatin (CRESTOR) 5 MG tablet, Take 5 mg by mouth every other day., Disp: , Rfl:   Past Medical History: Past Medical History:  Diagnosis Date  . Allergy   . Arthritis   . Ascending aortic dissection ([place]) 03/23/2017  . Atrial fibrillation ([place])   . Cataract    removed both eyes   . GERD (gastroesophageal reflux disease)   .  HYPERLIPIDEMIA   . 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 Hypertension   . Microscopic hematuria    negative cystoscopy  . NONSPECIFIC ABNORMAL ELECTROCARDIOGRAM   . S/P aortic dissection repair 03/24/2017   Biological [place] aortic root replacement + resection and grafting of entire ascending aorta, transverse aortic arch and proximal descending thoracic aorta with elephant trunk distal anastomosis and debranching of aortic arch vessels  . 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 Varicose veins     Tobacco Use: Social History   Tobacco Use  Smoking Status Former Smoker  . Packs/day: 2.00  . Years: 45.00  . Pack years: 90.00  . Types: Cigarettes  . Last attempt to quit: 03/22/2017  . Years since quitting: 0.2  Smokeless Tobacco Never Used  Tobacco Comment   2 ppd , quit 03/2017    Labs: Recent Review Flowsheet Data    Labs for ITP Cardiac and Pulmonary Rehab Latest Ref Rng & Units 04/03/2017 04/04/2017 04/05/2017 04/07/2017 06/14/2017   Cholestrol 0 - 200 mg/dL - - - - 145   LDLCALC 0 - 99 mg/dL - - - - 93   LDLDIRECT mg/dL - - - - -   HDL >39.00 mg/dL - - - - 34.40(L)   Trlycerides 0.0 - 149.0 mg/dL - - - - 88.0   Hemoglobin A1c 4.8 - 5.6 % - 5.5 - - -   PHART 7.350 - 7.450 - - - 7.523(H) -   PCO2ART 32.0 - 48.0 mmHg - - - 29.3(L) -   HCO3 20.0 - 28.0 mmol/L - - - 24.1 -   TCO2 22 - 32 mmol/L - - - 25 -   ACIDBASEDEF 0.0 - 2.0 mmol/L - - - - -   O2SAT % 46.7 63.5 48.2 95.0 -      Capillary Blood Glucose: Lab Results  Component Value Date   GLUCAP 98 04/08/2017   GLUCAP 64 (L) 04/07/2017   GLUCAP 139 (H) 03/26/2017   GLUCAP 145 (H) 03/26/2017   GLUCAP 120 (H) 03/26/2017     Exercise Target Goals: Date: 06/17/17  Exercise Program Goal: Individual exercise prescription set with THRR, safety & activity barriers. Participant demonstrates ability to understand and report RPE using BORG scale, to self-measure pulse accurately, and to acknowledge the importance of the exercise prescription.  Exercise Prescription Goal: Starting with aerobic activity 30 plus minutes a day, 3 days per week for initial exercise prescription. Provide home exercise prescription and guidelines that participant acknowledges understanding prior to discharge.  Activity Barriers & Risk  Stratification: Activity Barriers & Cardiac Risk Stratification - 06/17/17 1500      Activity Barriers & Cardiac Risk Stratification   Activity Barriers  Assistive Device;Other (comment);Deconditioning;Muscular Weakness;Joint Problems    Comments  R RTC surgery 3, L elbow surgery x2; B Knee scopes x2    Cardiac Risk Stratification  High       6 Minute Walk: 6 Minute Walk    Row Name 06/17/17 1500         6 Minute Walk   Phase  Initial     Distance  705 feet     Walk Time  6 minutes     # of Rest Breaks  0     MPH  1.34  METS  2.04     RPE  11     VO2 Peak  7.15     Symptoms  No     Resting HR  60 bpm     Resting BP  138/80     Max Ex. HR  73 bpm     Max Ex. BP  124/72        Oxygen Initial Assessment:   Oxygen Re-Evaluation:   Oxygen Discharge (Final Oxygen Re-Evaluation):   Initial Exercise Prescription: Initial Exercise Prescription - 06/17/17 1500      Date of Initial Exercise RX and Referring Provider   Date  06/17/17    Referring Provider  Verdie Mosher MD      NuStep   Level  1    SPM  60    Minutes  10    METs  1.5      Arm Ergometer   Level  1    RPM  10    Minutes  10    METs  1.4      Track   Laps  5    Minutes  10    METs  1.84      Prescription Details   Frequency (times per week)  3    Duration  Progress to 30 minutes of continuous aerobic without signs/symptoms of physical distress      Intensity   THRR 40-80% of Max Heartrate  61-122    Ratings of Perceived Exertion  11-15    Perceived Dyspnea  0-4      Progression   Progression  Continue to progress workloads to maintain intensity without signs/symptoms of physical distress.      Resistance Training   Training Prescription  Yes    Weight  1lb    Reps  10-15       Perform Capillary Blood Glucose checks as needed.  Exercise Prescription Changes:   Exercise Comments:   Exercise Goals and Review: Exercise Goals    Row Name 06/17/17 1427              Exercise Goals   Increase Physical Activity  Yes       Intervention  Provide advice, education, support and counseling about physical activity/exercise needs.;Develop an individualized exercise prescription for aerobic and resistive training based on initial evaluation findings, risk stratification, comorbidities and participant's personal goals.       Expected Outcomes  Achievement of increased cardiorespiratory fitness and enhanced flexibility, muscular endurance and strength shown through measurements of functional capacity and personal statement of participant.       Increase Strength and Stamina  Yes       Intervention  Provide advice, education, support and counseling about physical activity/exercise needs.;Develop an individualized exercise prescription for aerobic and resistive training based on initial evaluation findings, risk stratification, comorbidities and participant's personal goals.       Expected Outcomes  Achievement of increased cardiorespiratory fitness and enhanced flexibility, muscular endurance and strength shown through measurements of functional capacity and personal statement of participant.       Able to understand and use rate of perceived exertion (RPE) scale  Yes       Intervention  Provide education and explanation on how to use RPE scale       Expected Outcomes  Short Term: Able to use RPE daily in rehab to express subjective intensity level;Long Term:  Able to use RPE to guide intensity level when exercising independently  Knowledge and understanding of Target Heart Rate Range (THRR)  Yes       Intervention  Provide education and explanation of THRR including how the numbers were predicted and where they are located for reference       Expected Outcomes  Short Term: Able to state/look up THRR;Long Term: Able to use THRR to govern intensity when exercising independently;Short Term: Able to use daily as guideline for intensity in rehab       Able to check pulse  independently  Yes       Intervention  Provide education and demonstration on how to check pulse in carotid and radial arteries.;Review the importance of being able to check your own pulse for safety during independent exercise       Expected Outcomes  Short Term: Able to explain why pulse checking is important during independent exercise;Long Term: Able to check pulse independently and accurately       Understanding of Exercise Prescription  Yes       Intervention  Provide education, explanation, and written materials on patient's individual exercise prescription       Expected Outcomes  Short Term: Able to explain program exercise prescription;Long Term: Able to explain home exercise prescription to exercise independently          Exercise Goals Re-Evaluation :    Discharge Exercise Prescription (Final Exercise Prescription Changes):   Nutrition:  Target Goals: Understanding of nutrition guidelines, daily intake of sodium 1500mg , cholesterol 200mg , calories 30% from fat and 7% or less from saturated fats, daily to have 5 or more servings of fruits and vegetables.  Biometrics: Pre Biometrics - 06/17/17 1504      Pre Biometrics   Waist Circumference  37.5 inches    Hip Circumference  40.5 inches    Waist to Hip Ratio  0.93 %    Triceps Skinfold  16 mm    % Body Fat  24.9 %    Grip Strength  31 kg    Flexibility  7.5 in    Single Leg Stand  0.56 seconds        Nutrition Therapy Plan and Nutrition Goals:   Nutrition Discharge: Nutrition Scores:   Nutrition Goals Re-Evaluation:   Nutrition Goals Re-Evaluation:   Nutrition Goals Discharge (Final Nutrition Goals Re-Evaluation):   Psychosocial: Target Goals: Acknowledge presence or absence of significant depression and/or stress, maximize coping skills, provide positive support system. Participant is able to verbalize types and ability to use techniques and skills needed for reducing stress and depression.  Initial  Review & Psychosocial Screening: Initial Psych Review & Screening - 06/17/17 1602      Initial Review   Current issues with  Current Anxiety/Panic Pt takes Lexapro to "chill" out and xanax at bedtime to help with sleep.  Pt feel these medications work well for him.   Pt takes Lexapro to "chill" out and xanax at bedtime to help with sleep.  Pt feel these medications work well for him.     Family Dynamics   Good Support System?  Yes Pt wife accompanied him to his orientation appt.  Pt feels supported by his wife and family.   Pt wife accompanied him to his orientation appt.  Pt feels supported by his wife and family.     Barriers   Psychosocial barriers to participate in program  The patient should benefit from training in stress management and relaxation.      Screening Interventions   Interventions  Encouraged to  exercise       Quality of Life Scores: Quality of Life - 06/17/17 1506      Quality of Life Scores   Health/Function Pre  21.75 %    Socioeconomic Pre  29.17 %    Psych/Spiritual Pre  25.71 %    Family Pre  30 %    GLOBAL Pre  26.12 %       PHQ-9: Recent Review Flowsheet Data    Depression screen [facility] 2/9 03/12/2016 03/11/2015 04/24/2013   Decreased Interest 0 0 0   Down, Depressed, Hopeless 0 0 0   PHQ - 2 Score 0 0 0     Interpretation of Total Score  Total Score Depression Severity:  1-4 = Minimal depression, 5-9 = Mild depression, 10-14 = Moderate depression, 15-19 = Moderately severe depression, 20-27 = Severe depression   Psychosocial Evaluation and Intervention:   Psychosocial Re-Evaluation:   Psychosocial Discharge (Final Psychosocial Re-Evaluation):   Vocational Rehabilitation: Provide vocational rehab assistance to qualifying candidates.   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 Participant will state understanding/return demonstration of topics presented.  Learning Barriers/Preferences: Learning Barriers/Preferences - 06/17/17 1459      Learning Barriers/Preferences   Learning Barriers  Hearing    Learning Preferences  Video;Skilled Demonstration       Education Topics: Count Your Pulse:  -Group instruction provided by verbal instruction, demonstration, patient participation and written materials to support subject.  Instructors address importance of being able to find your pulse and how to count your pulse when at home without a heart monitor.  Patients get hands on experience counting their pulse with staff help and individually.   Heart Attack, Angina, and Risk Factor Modification:  -Group instruction provided by verbal instruction, video, and written materials to support subject.  Instructors address signs and symptoms of angina and heart attacks.    Also discuss risk factors for heart disease and how to make changes to improve heart health risk factors.   Functional Fitness:  -Group instruction provided by verbal instruction, demonstration, patient participation, and written materials to support subject.  Instructors address safety measures for doing things around the house.  Discuss how to get up and down off the floor, how to pick things up properly, how to safely get out of a chair without assistance, and balance training.   Meditation and Mindfulness:  -Group instruction provided by verbal instruction, patient participation, and written materials to support subject.  Instructor addresses importance of mindfulness and meditation practice to help reduce stress and improve awareness.  Instructor also leads participants through a meditation exercise.     Stretching for Flexibility and Mobility:  -Group instruction provided by verbal instruction, patient participation, and written materials to support subject.  Instructors lead participants through series of stretches that are designed to increase flexibility thus improving mobility.  These stretches are additional exercise for major muscle groups that are typically performed during regular warm up and cool down.   Hands Only CPR:  -Group verbal, video, and participation provides a basic overview of AHA guidelines for community CPR. Role-play of emergencies allow participants  the opportunity to practice calling for help and chest compression technique with discussion of AED use.   Hypertension: -Group verbal and written instruction that provides a basic overview of hypertension including the most recent diagnostic guidelines, risk factor reduction with self-care instructions and medication management.    Nutrition I class: Heart Healthy Eating:  -Group instruction provided by PowerPoint slides, verbal discussion, and written materials to support subject matter. The instructor gives an explanation and review of the Therapeutic Lifestyle Changes diet recommendations, which includes a discussion on lipid goals, dietary fat, sodium, fiber, plant stanol/sterol esters, sugar, and the components of a well-balanced, healthy diet.   Nutrition II class: Lifestyle Skills:  -Group instruction provided by PowerPoint slides, verbal discussion, and written materials to support subject matter. The instructor gives an explanation and review of label reading, grocery shopping for heart health, heart healthy recipe modifications, and ways to make healthier choices when eating out.   Diabetes Question & Answer:  -Group instruction provided by PowerPoint slides, verbal discussion, and written materials to support subject matter. The instructor gives an explanation and review of diabetes co-morbidities, pre-  and post-prandial blood glucose goals, pre-exercise blood glucose goals, signs, symptoms, and treatment of hypoglycemia and hyperglycemia, and foot care basics.   Diabetes Blitz:  -Group instruction provided by PowerPoint slides, verbal discussion, and written materials to support subject matter. The instructor gives an explanation and review of the physiology behind type 1 and type 2 diabetes, diabetes medications and rational behind using different medications, pre- and post-prandial blood glucose recommendations and Hemoglobin A1c goals, diabetes diet, and exercise including blood glucose guidelines for exercising safely.    Portion Distortion:  -Group instruction provided by PowerPoint slides, verbal discussion, written materials, and food models to support subject matter. The instructor gives an explanation of serving size versus portion size, changes in portions sizes over the last 20 years, and what consists of a serving from each food group.   Stress Management:  -Group instruction provided by verbal instruction, video, and written materials to support subject matter.  Instructors review role of stress in heart disease and how to cope with stress positively.     Exercising on Your Own:  -Group instruction provided by verbal instruction, power point, and written materials to support subject.  Instructors discuss benefits of exercise, components of exercise, frequency and intensity of exercise, and end points for exercise.  Also discuss use of nitroglycerin and activating EMS.  Review options of places to exercise outside of rehab.  Review guidelines for sex with heart disease.   Cardiac Drugs I:  -Group instruction provided by verbal instruction and written materials to support subject.  Instructor reviews cardiac drug classes: antiplatelets, anticoagulants, beta blockers, and statins.  Instructor discusses reasons, side effects, and lifestyle considerations for each drug  class.   Cardiac Drugs II:  -Group instruction provided by verbal instruction and written materials to support subject.  Instructor reviews cardiac drug classes: angiotensin converting enzyme inhibitors (ACE-I), angiotensin II receptor blockers (ARBs), nitrates, and calcium channel blockers.  Instructor discusses reasons, side effects, and lifestyle considerations for each drug class.   Anatomy and Physiology of the Circulatory System:  Group verbal and written instruction and models provide basic cardiac anatomy and physiology, with the coronary electrical and arterial systems. Review of: AMI, Angina, Valve disease, Heart Failure, Peripheral Artery Disease, Cardiac Arrhythmia, Pacemakers, and the ICD.   Other Education:  -Group or individual verbal, written, or video instructions that support the educational goals of the  cardiac rehab program.   Knowledge Questionnaire Score: Knowledge Questionnaire Score - 06/17/17 1511      Knowledge Questionnaire Score   Pre Score  22/24       Core Components/Risk Factors/Patient Goals at Admission: Personal Goals and Risk Factors at Admission - 06/17/17 1505      Core Components/Risk Factors/Patient Goals on Admission   Improve shortness of breath with ADL's  Yes    Intervention  Provide education, individualized exercise plan and daily activity instruction to help decrease symptoms of SOB with activities of daily living.    Expected Outcomes  Short Term: Achieves a reduction of symptoms when performing activities of daily living.    Hypertension  Yes    Intervention  Provide education on lifestyle modifcations including regular physical activity/exercise, weight management, moderate sodium restriction and increased consumption of fresh fruit, vegetables, and low fat dairy, alcohol moderation, and smoking cessation.;Monitor prescription use compliance.    Expected Outcomes  Short Term: Continued assessment and intervention until BP is < 140/57mm  HG in hypertensive participants. < 130/25mm HG in hypertensive participants with diabetes, heart failure or chronic kidney disease.;Long Term: Maintenance of blood pressure at goal levels.    Lipids  Yes    Intervention  Provide education and support for participant on nutrition & aerobic/resistive exercise along with prescribed medications to achieve LDL 70mg , HDL >40mg .    Expected Outcomes  Short Term: Participant states understanding of desired cholesterol values and is compliant with medications prescribed. Participant is following exercise prescription and nutrition guidelines.;Long Term: Cholesterol controlled with medications as prescribed, with individualized exercise RX and with personalized nutrition plan. Value goals: LDL < 70mg , HDL > 40 mg.       Core Components/Risk Factors/Patient Goals Review:    Core Components/Risk Factors/Patient Goals at Discharge (Final Review):    ITP Comments: ITP Comments    Row Name 06/17/17 1418           ITP Comments  Dr. Fransico Him, Medical Director          Comments:  Patient attended orientation from 1330 to 1530 to review rules and guidelines for program. Completed 6 minute walk test, Intitial ITP, and exercise prescription.  VSS. Telemetry-SR with BBB and negative QRS.  Pt  Asymptomatic and used rolater for stability and support. Brief Psychosocial Assessment reveals no barriers to participating in cardiac rehab.  Pt wife drives him here for rehab.  Pt is looking forward to increasing his stamina and decreasing his shortness of breath. Cherre Huger, BSN Cardiac and Training and development officer

## 2017-06-17 NOTE — Addendum Note (Signed)
Encounter addended by: Dorna Bloom D on: 06/17/2017 4:31 PM  Actions taken: Visit Navigator Flowsheet section accepted

## 2017-06-17 NOTE — Progress Notes (Signed)
Cardiac Rehab Medication Review by a Pharmacist  Does the patient  feel that his/her medications are working for him/her?  yes  Has the patient been experiencing any side effects to the medications prescribed?  no  Does the patient measure his/her own blood pressure or blood glucose at home?  yes   Does the patient have any problems obtaining medications due to transportation or finances?   no  Understanding of regimen: good Understanding of indications: excellent Potential of compliance: excellent    Sean Riley is here for orientation and has no problems with medication compliance. Patient is apprehensive about taking megace. Sean Riley will contact Dr Larose Kells regarding this.Sean Pall, RN,BSN 06/17/2017 2:09 PM     Sean Riley Sean Fritsch RN 06/17/2017 2:04 PM

## 2017-06-17 NOTE — Addendum Note (Signed)
Encounter addended by: Rowe Pavy, RN on: 06/17/2017 4:26 PM  Actions taken: Vitals modified, Visit Navigator Flowsheet section accepted, Sign clinical note

## 2017-06-18 NOTE — Addendum Note (Signed)
Encounter addended by: Jewel Baize, RD on: 06/18/2017 1:58 PM  Actions taken: Visit Navigator Flowsheet section accepted, Pend clinical note

## 2017-06-18 NOTE — Progress Notes (Signed)
Sean Riley 66 y.o. male DOB: [birth year] MRN: [id number]      Nutrition Note  1. S/P AVR (aortic valve replacement) and aortoplasty    Past Medical History:  Diagnosis Date  . Allergy   . Arthritis   . Ascending aortic dissection ([place]) 03/23/2017  . Atrial fibrillation ([place])   . Cataract    removed both eyes   . GERD (gastroesophageal reflux disease)   . HYPERLIPIDEMIA   . 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 Hypertension   . Microscopic hematuria    negative cystoscopy  . NONSPECIFIC ABNORMAL ELECTROCARDIOGRAM   . S/P aortic dissection repair 03/24/2017   Biological Bentall aortic root replacement + resection and grafting of entire ascending aorta, transverse aortic arch and proximal descending thoracic aorta with elephant trunk distal anastomosis and debranching of aortic arch vessels  . 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 Varicose veins    Meds reviewed. Megace noted  HT: Ht Readings from Last 1 Encounters:  06/17/17 5\' 11"  (1.803 m)    WT: Wt Readings from Last 3 Encounters:  06/17/17 169 lb 8.5 oz (76.9 kg)  06/14/17 172 lb 8 oz (78.2 kg)  05/18/17 173 lb 6 oz (78.6 kg)     BMI 23.7   Current tobacco use? No Recently quit tobacco use 03/22/17  Labs:  Lipid Panel     Component Value Date/Time   CHOL 145 06/14/2017 1108   TRIG 88.0 06/14/2017 1108   HDL 34.40 (L) 06/14/2017 1108   CHOLHDL 4 06/14/2017 1108   VLDL 17.6 06/14/2017 1108   LDLCALC 93 06/14/2017 1108   LDLDIRECT 137.9 04/24/2013 1031    Lab Results  Component Value Date   HGBA1C 5.5 04/04/2017   CBG (last 3)  No results for input(s): GLUCAP in the last 72 hours. Weight /BMI 06/17/2017 06/14/2017 05/18/2017 05/10/2017 05/04/2017  WEIGHT 169 lb 8.5 oz 172 lb 8 oz 173 lb 6 oz 172  lb 175 lb   Weight /BMI 04/16/2017 04/14/2017 04/09/2017 04/08/2017 10/05/2016  WEIGHT 183 lb 178 lb 171 lb 9.6 oz  192 lb 2 oz   Weight /BMI 07/15/2016 05/14/2016 04/30/2016 03/12/2016 01/10/2016  WEIGHT 193 lb 6.4 oz 193 lb 193 lb 191 lb 8 oz 195 lb   Weight /BMI 01/09/2016 12/24/2015 12/17/2015  WEIGHT 195 lb 12.8 oz 194 lb 9.6 oz 197 lb 6.4 oz   Nutrition Note Spoke with pt and pt's wife. Pt has unintentionally lost 23 lb after heart surgery. Pt wants to regain wt lost. Per discussion, pt c/o of a poor appetite. Pt taking Megace, which should help with appetite stimulation. Pt is drinking Boost supplements between meals to increase energy consumed. Nutrition plan and goals reviewed with pt. Pt expressed understanding of the information reviewed. Pt aware of nutrition education classes offered and plans on attending nutrition classes.  Nutrition Diagnosis ? Food-and nutrition-related knowledge deficit related to lack of exposure to information as related to diagnosis of: ? CVD ? Unintentional wt loss related to decreased appetite and food intake post-operatively as evidenced by wt loss of 12% in 9 months.  Nutrition Intervention ? Pt's individual nutrition plan and goals reviewed with pt.  Nutrition Goal(s):  ? Pt to identify food quantities necessary to achieve weight gain of 6-24 lb  at graduation from cardiac rehab. Goal wt of 189-192 lb desired.   Plan:  Pt to attend nutrition classes ? Nutrition I ? Nutrition II ? Portion Distortion  Will provide client-centered nutrition education as part of interdisciplinary care.   Monitor and evaluate progress toward nutrition goal with team.  Derek Mound, M.Ed, RD, LDN, CDE 06/18/2017 1:36 PM

## 2017-06-18 NOTE — Addendum Note (Signed)
Encounter addended by: Jewel Baize, RD on: 06/18/2017 2:14 PM  Actions taken: Visit Navigator Flowsheet section accepted, Sign clinical note

## 2017-06-21 ENCOUNTER — Encounter (HOSPITAL_COMMUNITY)
Admission: RE | Admit: 2017-06-21 | Discharge: 2017-06-21 | Disposition: A | Discharge status: Home / Self Care | Payer: Medicare Other | Source: Ambulatory Visit | Attending: Internal Medicine | Admitting: Internal Medicine

## 2017-06-21 DIAGNOSIS — Z953 Presence of xenogenic heart valve: Secondary | ICD-10-CM | POA: Diagnosis not present

## 2017-06-21 DIAGNOSIS — Z87891 Personal history of nicotine dependence: Secondary | ICD-10-CM | POA: Diagnosis not present

## 2017-06-21 DIAGNOSIS — Z79899 Other long term (current) drug therapy: Secondary | ICD-10-CM | POA: Diagnosis not present

## 2017-06-21 DIAGNOSIS — M1990 Unspecified osteoarthritis, unspecified site: Secondary | ICD-10-CM | POA: Diagnosis not present

## 2017-06-21 DIAGNOSIS — E785 Hyperlipidemia, unspecified: Secondary | ICD-10-CM | POA: Diagnosis not present

## 2017-06-21 DIAGNOSIS — K219 Gastro-esophageal reflux disease without esophagitis: Secondary | ICD-10-CM | POA: Diagnosis not present

## 2017-06-21 DIAGNOSIS — Z7982 Long term (current) use of aspirin: Secondary | ICD-10-CM | POA: Diagnosis not present

## 2017-06-21 DIAGNOSIS — Z7951 Long term (current) use of inhaled steroids: Secondary | ICD-10-CM | POA: Diagnosis not present

## 2017-06-21 DIAGNOSIS — Z952 Presence of prosthetic heart valve: Secondary | ICD-10-CM

## 2017-06-21 DIAGNOSIS — I4891 Unspecified atrial fibrillation: Secondary | ICD-10-CM | POA: Diagnosis not present

## 2017-06-21 DIAGNOSIS — I10 Essential (primary) hypertension: Secondary | ICD-10-CM | POA: Diagnosis not present

## 2017-06-21 NOTE — Progress Notes (Signed)
Daily Session Note  Patient Details  Name: Sean Riley MRN: [id number] Date of Birth: 06-25-1951 Referring Provider:     CARDIAC REHAB PHASE II ORIENTATION from 06/17/2017 in [place]  Referring Provider  Verdie Mosher MD      Encounter Date: 06/21/2017  Check In:   Capillary Blood Glucose: No results found for this or any previous visit (from the past 24 hour(s)).    Social History   Tobacco Use  Smoking Status Former Smoker  . Packs/day: 2.00  . Years: 45.00  . Pack years: 90.00  . Types: Cigarettes  . Last attempt to quit: 03/22/2017  . Years since quitting: 0.2  Smokeless Tobacco Never Used  Tobacco Comment   2 ppd , quit 03/2017    Goals Met:  No report of cardiac concerns or symptoms  Goals Unmet:  Not Applicable  Comments: Pt started cardiac rehab today.  Pt tolerated light exercise without difficulty. VSS, telemetry-Sinus Rhtyhm with a bundle branch block and a negative QRS., asymptomatic.  Medication list reconciled. Pt denies barriers to medicaiton compliance.  PSYCHOSOCIAL ASSESSMENT:  PHQ-0. Pt exhibits positive coping skills, hopeful outlook with supportive family. No psychosocial needs identified at this time, no psychosocial interventions necessary.    Pt enjoys tinkering around.   Pt oriented to exercise equipment and routine.    Understanding verbalized. Eddie used a wheelchair for stability as he is somewhat deconditioned.Barnet Pall, RN,BSN 06/21/2017 5:07 PM   Dr. Fransico Him is Medical Director for Cardiac Rehab at [facility].

## 2017-06-23 ENCOUNTER — Encounter (HOSPITAL_COMMUNITY)
Admission: RE | Admit: 2017-06-23 | Discharge: 2017-06-23 | Disposition: A | Discharge status: Home / Self Care | Payer: Medicare Other | Source: Ambulatory Visit | Attending: Internal Medicine | Admitting: Internal Medicine

## 2017-06-23 DIAGNOSIS — Z79899 Other long term (current) drug therapy: Secondary | ICD-10-CM | POA: Diagnosis not present

## 2017-06-23 DIAGNOSIS — Z952 Presence of prosthetic heart valve: Secondary | ICD-10-CM

## 2017-06-23 DIAGNOSIS — Z7951 Long term (current) use of inhaled steroids: Secondary | ICD-10-CM | POA: Diagnosis not present

## 2017-06-23 DIAGNOSIS — I4891 Unspecified atrial fibrillation: Secondary | ICD-10-CM | POA: Diagnosis not present

## 2017-06-23 DIAGNOSIS — M1990 Unspecified osteoarthritis, unspecified site: Secondary | ICD-10-CM | POA: Diagnosis not present

## 2017-06-23 DIAGNOSIS — Z7982 Long term (current) use of aspirin: Secondary | ICD-10-CM | POA: Diagnosis not present

## 2017-06-23 DIAGNOSIS — Z953 Presence of xenogenic heart valve: Secondary | ICD-10-CM | POA: Diagnosis not present

## 2017-06-24 ENCOUNTER — Other Ambulatory Visit: Payer: Medicare Other

## 2017-06-24 ENCOUNTER — Encounter: Payer: Medicare Other | Admitting: Genetic Counselor

## 2017-06-25 ENCOUNTER — Encounter (HOSPITAL_COMMUNITY): Payer: Medicare Other

## 2017-06-28 ENCOUNTER — Encounter (HOSPITAL_COMMUNITY)
Admission: RE | Admit: 2017-06-28 | Discharge: 2017-06-28 | Disposition: A | Discharge status: Home / Self Care | Payer: Medicare Other | Source: Ambulatory Visit | Attending: Internal Medicine | Admitting: Internal Medicine

## 2017-06-28 DIAGNOSIS — Z953 Presence of xenogenic heart valve: Secondary | ICD-10-CM | POA: Diagnosis not present

## 2017-06-28 DIAGNOSIS — I4891 Unspecified atrial fibrillation: Secondary | ICD-10-CM | POA: Diagnosis not present

## 2017-06-28 DIAGNOSIS — M1990 Unspecified osteoarthritis, unspecified site: Secondary | ICD-10-CM | POA: Diagnosis not present

## 2017-06-28 DIAGNOSIS — Z952 Presence of prosthetic heart valve: Secondary | ICD-10-CM

## 2017-06-28 DIAGNOSIS — Z7951 Long term (current) use of inhaled steroids: Secondary | ICD-10-CM | POA: Diagnosis not present

## 2017-06-28 DIAGNOSIS — Z79899 Other long term (current) drug therapy: Secondary | ICD-10-CM | POA: Diagnosis not present

## 2017-06-28 DIAGNOSIS — Z7982 Long term (current) use of aspirin: Secondary | ICD-10-CM | POA: Diagnosis not present

## 2017-06-29 ENCOUNTER — Other Ambulatory Visit (INDEPENDENT_AMBULATORY_CARE_PROVIDER_SITE_OTHER): Payer: Medicare Other

## 2017-06-29 DIAGNOSIS — E871 Hypo-osmolality and hyponatremia: Secondary | ICD-10-CM | POA: Diagnosis not present

## 2017-06-29 LAB — BASIC METABOLIC PANEL
BUN: 11 mg/dL (ref 6–23)
CALCIUM: 9.2 mg/dL (ref 8.4–10.5)
CO2: 27 meq/L (ref 19–32)
Chloride: 96 mEq/L (ref 96–112)
Creatinine, Ser: 0.82 mg/dL (ref 0.40–1.50)
GFR: 99.42 mL/min (ref >60.00)
GLUCOSE: 89 mg/dL (ref 70–99)
Potassium: 4.9 mEq/L (ref 3.5–5.1)
Sodium: 129 mEq/L — ABNORMAL LOW (ref 135–145)

## 2017-06-30 ENCOUNTER — Encounter (HOSPITAL_COMMUNITY): Payer: Medicare Other

## 2017-07-05 ENCOUNTER — Encounter (HOSPITAL_COMMUNITY)
Admission: RE | Admit: 2017-07-05 | Discharge: 2017-07-05 | Disposition: A | Discharge status: Home / Self Care | Payer: Medicare Other | Source: Ambulatory Visit | Attending: Internal Medicine | Admitting: Internal Medicine

## 2017-07-05 ENCOUNTER — Telehealth: Payer: Self-pay | Admitting: Internal Medicine

## 2017-07-05 DIAGNOSIS — I4891 Unspecified atrial fibrillation: Secondary | ICD-10-CM | POA: Diagnosis not present

## 2017-07-05 DIAGNOSIS — Z7982 Long term (current) use of aspirin: Secondary | ICD-10-CM | POA: Diagnosis not present

## 2017-07-05 DIAGNOSIS — Z7951 Long term (current) use of inhaled steroids: Secondary | ICD-10-CM | POA: Diagnosis not present

## 2017-07-05 DIAGNOSIS — Z952 Presence of prosthetic heart valve: Secondary | ICD-10-CM

## 2017-07-05 DIAGNOSIS — Z953 Presence of xenogenic heart valve: Secondary | ICD-10-CM | POA: Diagnosis not present

## 2017-07-05 DIAGNOSIS — M1990 Unspecified osteoarthritis, unspecified site: Secondary | ICD-10-CM | POA: Diagnosis not present

## 2017-07-05 DIAGNOSIS — Z79899 Other long term (current) drug therapy: Secondary | ICD-10-CM | POA: Diagnosis not present

## 2017-07-05 NOTE — Telephone Encounter (Signed)
Spoke with the patient's wife, patient is having mild lower extremity edema, mostly around the ankles.  Also some bruising there but denies any no actual bleeding anywhere or other bruising. Overall he feels okay, BP today 104/74. For now recommend leg elevation, continue avoiding excessive water due to hyponatremia, low Na+ intake; call in few days if he is not improving, call if the swelling gets worse.  We will consider a low-dose of Lasix but with that kind of blood pressure I prefer to manage this conservatively.  She is in agreement.

## 2017-07-05 NOTE — Telephone Encounter (Signed)
Basic metabolic panel (Order [id number])  Result Notes for Basic metabolic panel   Notes recorded by Colon Branch, MD on 06/30/2017 at 1:16 PM EST I spoke with the patient, feels about the same, denies nausea. No edema. Has persistent hyponatremia in the context of taking SSRIs and Lasix. Plan: Recheck a BMP in 6 weeks, will call the patient at the time.

## 2017-07-05 NOTE — Telephone Encounter (Signed)
Please advise 

## 2017-07-05 NOTE — Telephone Encounter (Signed)
Copied from [place]. Topic: Quick Communication - See Telephone Encounter >> Jul 05, 2017 10:17 AM Antonieta Iba C wrote: CRM for notification. See Telephone encounter for:  07/05/17.    Ms. [name] (Spouse) called in to request a call back from assistant. She said that pt spoke with provider about his sodium. She said that pt is experiencing some feet/ankle swelling. She would like a call back at:  [phone]

## 2017-07-07 ENCOUNTER — Encounter (HOSPITAL_COMMUNITY)
Admission: RE | Admit: 2017-07-07 | Discharge: 2017-07-07 | Disposition: A | Discharge status: Home / Self Care | Payer: Medicare Other | Source: Ambulatory Visit | Attending: Internal Medicine | Admitting: Internal Medicine

## 2017-07-07 DIAGNOSIS — Z953 Presence of xenogenic heart valve: Secondary | ICD-10-CM | POA: Diagnosis not present

## 2017-07-07 DIAGNOSIS — Z79899 Other long term (current) drug therapy: Secondary | ICD-10-CM | POA: Diagnosis not present

## 2017-07-07 DIAGNOSIS — Z952 Presence of prosthetic heart valve: Secondary | ICD-10-CM

## 2017-07-07 DIAGNOSIS — I4891 Unspecified atrial fibrillation: Secondary | ICD-10-CM | POA: Diagnosis not present

## 2017-07-07 DIAGNOSIS — Z7951 Long term (current) use of inhaled steroids: Secondary | ICD-10-CM | POA: Diagnosis not present

## 2017-07-07 DIAGNOSIS — M1990 Unspecified osteoarthritis, unspecified site: Secondary | ICD-10-CM | POA: Diagnosis not present

## 2017-07-07 DIAGNOSIS — Z7982 Long term (current) use of aspirin: Secondary | ICD-10-CM | POA: Diagnosis not present

## 2017-07-07 NOTE — Progress Notes (Addendum)
Reviewed home exercise with pt today.  Pt plans to do seated chair exercises 2-3x/week.  Reviewed THR, pulse, RPE, sign and symptoms, NTG use, and when to call 911 or MD.  Also discussed weather considerations and indoor options.  Pt voiced understanding.    [employer], ACSM RCEP

## 2017-07-09 ENCOUNTER — Encounter (HOSPITAL_COMMUNITY): Payer: Medicare Other

## 2017-07-09 NOTE — Progress Notes (Signed)
Cardiac Individual Treatment Plan  Patient Details  Name: Sean Riley MRN: [id number] Date of Birth: [birth year] Referring Provider:     CARDIAC REHAB PHASE II ORIENTATION from 06/17/2017 in [place]  Referring Provider  Verdie Mosher MD      Initial Encounter Date:    CARDIAC REHAB PHASE II ORIENTATION from 06/17/2017 in [place]  Date  06/17/17  Referring Provider  Verdie Mosher MD      Visit Diagnosis: S/P AVR (aortic valve replacement) and aortoplasty  Patient's Home Medications on Admission:  Current Outpatient Medications:  .  albuterol (PROAIR HFA) 108 (90 Base) MCG/ACT inhaler, Inhale two puffs every four to six hours as needed for cough or wheeze. (Patient taking differently: Inhale 2 puffs into the lungs See admin instructions. Every 4-6 hours as needed for coughing or wheezing), Disp: 3 Inhaler, Rfl: 0 .  albuterol (PROVENTIL) (2.5 MG/3ML) 0.083% nebulizer solution, Take 3 mLs (2.5 mg total) by nebulization every 8 (eight) hours as needed for wheezing or shortness of breath. (Patient not taking: Reported on 06/17/2017), Disp: 150 mL, Rfl: 1 .  ALPRAZolam (XANAX) 1 MG tablet, Take 1-1.5 tablets (1-1.5 mg total) by mouth at bedtime., Disp: 40 tablet, Rfl: 1 .  aspirin EC 325 MG EC tablet, Take 1 tablet (325 mg total) by mouth daily., Disp: 30 tablet, Rfl: 0 .  azelastine (ASTELIN) 0.1 % nasal spray, Can use two sprays in each nostril twice daily as directed. (Patient taking differently: Place 2 sprays into both nostrils 2 (two) times daily. ), Disp: 90 mL, Rfl: 3 .  calcipotriene-betamethasone (TACLONEX) ointment, Apply 1 application topically daily as needed (for eczema). , Disp: , Rfl:  .  escitalopram (LEXAPRO) 10 MG tablet, Take 1 tablet (10 mg total) by mouth daily., Disp: 30 tablet, Rfl: 6 .  esomeprazole (NEXIUM) 40 MG capsule, Take 1 capsule (40 mg total) by mouth daily before breakfast., Disp: 90  capsule, Rfl: 3 .  flecainide (TAMBOCOR) 100 MG tablet, Take 1 tablet (100 mg total) by mouth 2 (two) times daily., Disp: 180 tablet, Rfl: 3 .  fluticasone (FLONASE) 50 MCG/ACT nasal spray, Use one spray in each nostril twice daily., Disp: 48 g, Rfl: 3 .  furosemide (LASIX) 40 MG tablet, Take 1 tablet (40 mg total) by mouth daily as needed. (Patient not taking: Reported on 06/14/2017), Disp: 30 tablet, Rfl: 0 .  ipratropium (ATROVENT) 0.06 % nasal spray, Can use two sprays in each nostril every six hours as needed to dry up nose. (Patient not taking: Reported on 06/17/2017), Disp: 45 mL, Rfl: 3 .  megestrol (MEGACE) 400 MG/10ML suspension, Take 10 mLs (400 mg total) daily by mouth. (Patient not taking: Reported on 06/17/2017), Disp: 480 mL, Rfl: 0 .  metoprolol tartrate (LOPRESSOR) 25 MG tablet, Take 1 tablet (25 mg total) by mouth 2 (two) times daily., Disp: 180 tablet, Rfl: 3 .  potassium chloride SA (K-DUR,KLOR-CON) 20 MEQ tablet, Take 1 tablet (20 mEq total) by mouth daily as needed. (Patient not taking: Reported on 06/14/2017), Disp: 30 tablet, Rfl: 0 .  rosuvastatin (CRESTOR) 5 MG tablet, Take 5 mg by mouth every other day., Disp: , Rfl:   Past Medical History: Past Medical History:  Diagnosis Date  . Allergy   . Arthritis   . Ascending aortic dissection ([place]) 03/23/2017  . Atrial fibrillation ([place])   . Cataract    removed both eyes   . GERD (gastroesophageal reflux disease)   .  HYPERLIPIDEMIA   . 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 Hypertension   . Microscopic hematuria    negative cystoscopy  . NONSPECIFIC ABNORMAL ELECTROCARDIOGRAM   . S/P aortic dissection repair 03/24/2017   Biological Bentall aortic root replacement + resection and grafting of entire ascending aorta, transverse aortic arch and proximal descending thoracic aorta with elephant trunk distal anastomosis and debranching of aortic arch vessels  . 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 Varicose veins     Tobacco Use: Social History   Tobacco Use  Smoking Status Former Smoker  . Packs/day: 2.00  . Years: 45.00  . Pack years: 90.00  . Types: Cigarettes  . Last attempt to quit: 03/22/2017  . Years since quitting: 0.2  Smokeless Tobacco Never Used  Tobacco Comment   2 ppd , quit 03/2017    Labs: Recent Review Flowsheet Data    Labs for ITP Cardiac and Pulmonary Rehab Latest Ref Rng & Units 04/03/2017 04/04/2017 04/05/2017 04/07/2017 06/14/2017   Cholestrol 0 - 200 mg/dL - - - - 145   LDLCALC 0 - 99 mg/dL - - - - 93   LDLDIRECT mg/dL - - - - -   HDL >39.00 mg/dL - - - - 34.40(L)   Trlycerides 0.0 - 149.0 mg/dL - - - - 88.0   Hemoglobin A1c 4.8 - 5.6 % - 5.5 - - -   PHART 7.350 - 7.450 - - - 7.523(H) -   PCO2ART 32.0 - 48.0 mmHg - - - 29.3(L) -   HCO3 20.0 - 28.0 mmol/L - - - 24.1 -   TCO2 22 - 32 mmol/L - - - 25 -   ACIDBASEDEF 0.0 - 2.0 mmol/L - - - - -   O2SAT % 46.7 63.5 48.2 95.0 -      Capillary Blood Glucose: Lab Results  Component Value Date   GLUCAP 98 04/08/2017   GLUCAP 64 (L) 04/07/2017   GLUCAP 139 (H) 03/26/2017   GLUCAP 145 (H) 03/26/2017   GLUCAP 120 (H) 03/26/2017     Exercise Target Goals:    Exercise Program Goal: Individual exercise prescription set with THRR, safety & activity barriers. Participant demonstrates ability to understand and report RPE using BORG scale, to self-measure pulse accurately, and to acknowledge the importance of the exercise prescription.  Exercise Prescription Goal: Starting with aerobic activity 30 plus minutes a day, 3 days per week for initial exercise prescription. Provide home exercise prescription and guidelines that participant acknowledges understanding prior to discharge.  Activity Barriers & Risk Stratification: Activity  Barriers & Cardiac Risk Stratification - 06/17/17 1500      Activity Barriers & Cardiac Risk Stratification   Activity Barriers  Assistive Device;Other (comment);Deconditioning;Muscular Weakness;Joint Problems    Comments  R RTC surgery 3, L elbow surgery x2; B Knee scopes x2    Cardiac Risk Stratification  High       6 Minute Walk: 6 Minute Walk    Row Name 06/17/17 1500 06/17/17 1631       6 Minute Walk   Phase  Initial  -    Distance  705 feet  -    Walk Time  6 minutes  -    # of Rest Breaks  0  -  MPH  1.34  -    METS  2.04  -    RPE  11  -    VO2 Peak  7.15  -    Symptoms  No  -    Resting HR  60 bpm  -    Resting BP  138/80  -    Resting Oxygen Saturation   -  96 %    Exercise Oxygen Saturation  during 6 min walk  -  96 %    Max Ex. HR  73 bpm  -    Max Ex. BP  124/72  -       Oxygen Initial Assessment:   Oxygen Re-Evaluation:   Oxygen Discharge (Final Oxygen Re-Evaluation):   Initial Exercise Prescription: Initial Exercise Prescription - 06/17/17 1500      Date of Initial Exercise RX and Referring Provider   Date  06/17/17    Referring Provider  Verdie Mosher MD      NuStep   Level  1    SPM  60    Minutes  10    METs  1.5      Arm Ergometer   Level  1    RPM  10    Minutes  10    METs  1.4      Track   Laps  5    Minutes  10    METs  1.84      Prescription Details   Frequency (times per week)  3    Duration  Progress to 30 minutes of continuous aerobic without signs/symptoms of physical distress      Intensity   THRR 40-80% of Max Heartrate  61-122    Ratings of Perceived Exertion  11-15    Perceived Dyspnea  0-4      Progression   Progression  Continue to progress workloads to maintain intensity without signs/symptoms of physical distress.      Resistance Training   Training Prescription  Yes    Weight  1lb    Reps  10-15       Perform Capillary Blood Glucose checks as needed.  Exercise Prescription Changes: Exercise  Prescription Changes    Row Name 06/21/17 1518 07/05/17 1545           Response to Exercise   Blood Pressure (Admit)  112/70  118/78      Blood Pressure (Exercise)  123/76  122/64      Blood Pressure (Exit)  100/80  104/74      Heart Rate (Admit)  68 bpm  67 bpm      Heart Rate (Exercise)  68 bpm  83 bpm      Heart Rate (Exit)  58 bpm  61 bpm      Symptoms  none  none      Comments  BP dropped with exercise. Encouraged hydration  -      Duration  Continue with 30 min of aerobic exercise without signs/symptoms of physical distress.  Continue with 30 min of aerobic exercise without signs/symptoms of physical distress.      Intensity  THRR unchanged  THRR unchanged        Progression   Progression  Continue to progress workloads to maintain intensity without signs/symptoms of physical distress.  Continue to progress workloads to maintain intensity without signs/symptoms of physical distress.      Average METs  1.5  2.2        Resistance Training  Training Prescription  Yes  Yes      Weight  1lb  2lbs      Reps  10-15  10-15      Time  10 Minutes  10 Minutes        NuStep   Level  1  2      SPM  60  70      Minutes  10  10      METs  1.2  1.8        Arm Ergometer   Level  1  1      RPM  10  -      Minutes  10  15      METs  1.4  2.67        Track   Laps  5  8      Minutes  10  10      METs  1.84  2.39         Exercise Comments: Exercise Comments    Row Name 06/24/17 1522 07/07/17 1550         Exercise Comments  Pt was oriented to exercise equipment on 06/21/17. Pt responded well to exercise prescription; will continue to monitor.   Reviewed HEP 07/07/17         Exercise Goals and Review: Exercise Goals    Row Name 06/17/17 1427             Exercise Goals   Increase Physical Activity  Yes       Intervention  Provide advice, education, support and counseling about physical activity/exercise needs.;Develop an individualized exercise prescription for  aerobic and resistive training based on initial evaluation findings, risk stratification, comorbidities and participant's personal goals.       Expected Outcomes  Achievement of increased cardiorespiratory fitness and enhanced flexibility, muscular endurance and strength shown through measurements of functional capacity and personal statement of participant.       Increase Strength and Stamina  Yes       Intervention  Provide advice, education, support and counseling about physical activity/exercise needs.;Develop an individualized exercise prescription for aerobic and resistive training based on initial evaluation findings, risk stratification, comorbidities and participant's personal goals.       Expected Outcomes  Achievement of increased cardiorespiratory fitness and enhanced flexibility, muscular endurance and strength shown through measurements of functional capacity and personal statement of participant.       Able to understand and use rate of perceived exertion (RPE) scale  Yes       Intervention  Provide education and explanation on how to use RPE scale       Expected Outcomes  Short Term: Able to use RPE daily in rehab to express subjective intensity level;Long Term:  Able to use RPE to guide intensity level when exercising independently       Knowledge and understanding of Target Heart Rate Range (THRR)  Yes       Intervention  Provide education and explanation of THRR including how the numbers were predicted and where they are located for reference       Expected Outcomes  Short Term: Able to state/look up THRR;Long Term: Able to use THRR to govern intensity when exercising independently;Short Term: Able to use daily as guideline for intensity in rehab       Able to check pulse independently  Yes       Intervention  Provide education and demonstration on how to check  pulse in carotid and radial arteries.;Review the importance of being able to check your own pulse for safety during  independent exercise       Expected Outcomes  Short Term: Able to explain why pulse checking is important during independent exercise;Long Term: Able to check pulse independently and accurately       Understanding of Exercise Prescription  Yes       Intervention  Provide education, explanation, and written materials on patient's individual exercise prescription       Expected Outcomes  Short Term: Able to explain program exercise prescription;Long Term: Able to explain home exercise prescription to exercise independently          Exercise Goals Re-Evaluation : Exercise Goals Re-Evaluation    Row Name 07/06/17 1547 07/07/17 1548           Exercise Goal Re-Evaluation   Exercise Goals Review  Increase Physical Activity;Able to understand and use rate of perceived exertion (RPE) scale;Understanding of Exercise Prescription;Increase Strength and Stamina  Increase Physical Activity;Understanding of Exercise Prescription;Knowledge and understanding of Target Heart Rate Range (THRR);Increase Strength and Stamina;Able to check pulse independently;Able to understand and use rate of perceived exertion (RPE) scale      Comments  Pt is making great progress in cardiac rehab and has a great understanding of Ex Rx and demonstrate proper use of RPE scale. Pt has also increased handheld weights from 1lbs to 2lbs to increase strength and postural muscles.  Reviewed home exercise with pt today.  Pt plans to do seated chair exercises 2-3x/week.  Reviewed THR, pulse, RPE, sign and symptoms, NTG use, and when to call 911 or MD.  Also discussed weather considerations and indoor options.  Pt voiced understanding.      Expected Outcomes  Pt will continue to improve in cardiorespiratory fitness and musculoskeletal strength  Pt will continue to improve in cardiorespiratory fitness and musculoskeletal strength          Discharge Exercise Prescription (Final Exercise Prescription Changes): Exercise Prescription Changes -  07/05/17 1545      Response to Exercise   Blood Pressure (Admit)  118/78    Blood Pressure (Exercise)  122/64    Blood Pressure (Exit)  104/74    Heart Rate (Admit)  67 bpm    Heart Rate (Exercise)  83 bpm    Heart Rate (Exit)  61 bpm    Symptoms  none    Duration  Continue with 30 min of aerobic exercise without signs/symptoms of physical distress.    Intensity  THRR unchanged      Progression   Progression  Continue to progress workloads to maintain intensity without signs/symptoms of physical distress.    Average METs  2.2      Resistance Training   Training Prescription  Yes    Weight  2lbs    Reps  10-15    Time  10 Minutes      NuStep   Level  2    SPM  70    Minutes  10    METs  1.8      Arm Ergometer   Level  1    Minutes  15    METs  2.67      Track   Laps  8    Minutes  10    METs  2.39       Nutrition:  Target Goals: Understanding of nutrition guidelines, daily intake of sodium 1500mg , cholesterol 200mg , calories 30% from fat  and 7% or less from saturated fats, daily to have 5 or more servings of fruits and vegetables.  Biometrics: Pre Biometrics - 06/17/17 1504      Pre Biometrics   Waist Circumference  37.5 inches    Hip Circumference  40.5 inches    Waist to Hip Ratio  0.93 %    Triceps Skinfold  16 mm    % Body Fat  24.9 %    Grip Strength  31 kg    Flexibility  7.5 in    Single Leg Stand  0.56 seconds        Nutrition Therapy Plan and Nutrition Goals: Nutrition Therapy & Goals - 06/18/17 1334      Nutrition Therapy   Diet  Therapeutic Lifestyle Changes      Personal Nutrition Goals   Nutrition Goal  Pt to identify food quantities necessary to achieve weight gain of 6-24 lb at graduation from cardiac rehab. Goal wt of 189-192 lb desired.       Intervention Plan   Intervention  Prescribe, educate and counsel regarding individualized specific dietary modifications aiming towards targeted core components such as weight, hypertension,  lipid management, diabetes, heart failure and other comorbidities.    Expected Outcomes  Short Term Goal: Understand basic principles of dietary content, such as calories, fat, sodium, cholesterol and nutrients.;Long Term Goal: Adherence to prescribed nutrition plan.       Nutrition Discharge: Nutrition Scores: Nutrition Assessments - 06/18/17 1334      MEDFICTS Scores   Pre Score  24       Nutrition Goals Re-Evaluation:   Nutrition Goals Re-Evaluation:   Nutrition Goals Discharge (Final Nutrition Goals Re-Evaluation):   Psychosocial: Target Goals: Acknowledge presence or absence of significant depression and/or stress, maximize coping skills, provide positive support system. Participant is able to verbalize types and ability to use techniques and skills needed for reducing stress and depression.  Initial Review & Psychosocial Screening: Initial Psych Review & Screening - 06/17/17 1602      Initial Review   Current issues with  Current Anxiety/Panic Pt takes Lexapro to "chill" out and xanax at bedtime to help with sleep.  Pt feel these medications work well for him.      Family Dynamics   Good Support System?  Yes Pt wife accompanied him to his orientation appt.  Pt feels supported by his wife and family.      Barriers   Psychosocial barriers to participate in program  The patient should benefit from training in stress management and relaxation.      Screening Interventions   Interventions  Encouraged to exercise       Quality of Life Scores: Quality of Life - 06/17/17 1506      Quality of Life Scores   Health/Function Pre  21.75 %    Socioeconomic Pre  29.17 %    Psych/Spiritual Pre  25.71 %    Family Pre  30 %    GLOBAL Pre  26.12 %       PHQ-9: Recent Review Flowsheet Data    Depression screen [facility] 2/9 06/21/2017 03/12/2016 03/11/2015 04/24/2013   Decreased Interest 0 0 0 0   Down, Depressed, Hopeless 0 0 0 0   PHQ - 2 Score 0 0 0 0     Interpretation of  Total Score  Total Score Depression Severity:  1-4 = Minimal depression, 5-9 = Mild depression, 10-14 = Moderate depression, 15-19 = Moderately severe depression, 20-27 = Severe  depression   Psychosocial Evaluation and Intervention:   Psychosocial Re-Evaluation: Psychosocial Re-Evaluation    Row Name 07/09/17 1601             Psychosocial Re-Evaluation   Current issues with  Current Stress Concerns       Comments  Sean Riley has stressors due to his current state of health       Expected Outcomes  Will offer emotional support as needed       Interventions  Stress management education;Encouraged to attend Cardiac Rehabilitation for the exercise       Continue Psychosocial Services   Follow up required by staff         Initial Review   Source of Stress Concerns  Chronic Illness          Psychosocial Discharge (Final Psychosocial Re-Evaluation): Psychosocial Re-Evaluation - 07/09/17 1601      Psychosocial Re-Evaluation   Current issues with  Current Stress Concerns    Comments  Sean Riley has stressors due to his current state of health    Expected Outcomes  Will offer emotional support as needed    Interventions  Stress management education;Encouraged to attend Cardiac Rehabilitation for the exercise    Continue Psychosocial Services   Follow up required by staff      Initial Review   Source of Stress Concerns  Chronic Illness       Vocational Rehabilitation: Provide vocational rehab assistance to qualifying candidates.   Vocational Rehab Evaluation & Intervention: Vocational Rehab - 06/17/17 1604      Initial Vocational Rehab Evaluation & Intervention   Assessment shows need for Vocational Rehabilitation  No Retired - raises beef cows on a farm in [place]       Education: Education Goals: Education classes will be provided on a weekly basis, covering required topics. Participant will state understanding/return demonstration of topics presented.  Learning  Barriers/Preferences: Learning Barriers/Preferences - 06/17/17 1459      Learning Barriers/Preferences   Learning Barriers  Hearing    Learning Preferences  Video;Skilled Demonstration       Education Topics: Count Your Pulse:  -Group instruction provided by verbal instruction, demonstration, patient participation and written materials to support subject.  Instructors address importance of being able to find your pulse and how to count your pulse when at home without a heart monitor.  Patients get hands on experience counting their pulse with staff help and individually.   Heart Attack, Angina, and Risk Factor Modification:  -Group instruction provided by verbal instruction, video, and written materials to support subject.  Instructors address signs and symptoms of angina and heart attacks.    Also discuss risk factors for heart disease and how to make changes to improve heart health risk factors.   Functional Fitness:  -Group instruction provided by verbal instruction, demonstration, patient participation, and written materials to support subject.  Instructors address safety measures for doing things around the house.  Discuss how to get up and down off the floor, how to pick things up properly, how to safely get out of a chair without assistance, and balance training.   Meditation and Mindfulness:  -Group instruction provided by verbal instruction, patient participation, and written materials to support subject.  Instructor addresses importance of mindfulness and meditation practice to help reduce stress and improve awareness.  Instructor also leads participants through a meditation exercise.    Stretching for Flexibility and Mobility:  -Group instruction provided by verbal instruction, patient participation, and written materials to support  subject.  Instructors lead participants through series of stretches that are designed to increase flexibility thus improving mobility.  These  stretches are additional exercise for major muscle groups that are typically performed during regular warm up and cool down.   Hands Only CPR:  -Group verbal, video, and participation provides a basic overview of AHA guidelines for community CPR. Role-play of emergencies allow participants the opportunity to practice calling for help and chest compression technique with discussion of AED use.   Hypertension: -Group verbal and written instruction that provides a basic overview of hypertension including the most recent diagnostic guidelines, risk factor reduction with self-care instructions and medication management.    Nutrition I class: Heart Healthy Eating:  -Group instruction provided by PowerPoint slides, verbal discussion, and written materials to support subject matter. The instructor gives an explanation and review of the Therapeutic Lifestyle Changes diet recommendations, which includes a discussion on lipid goals, dietary fat, sodium, fiber, plant stanol/sterol esters, sugar, and the components of a well-balanced, healthy diet.   Nutrition II class: Lifestyle Skills:  -Group instruction provided by PowerPoint slides, verbal discussion, and written materials to support subject matter. The instructor gives an explanation and review of label reading, grocery shopping for heart health, heart healthy recipe modifications, and ways to make healthier choices when eating out.   Diabetes Question & Answer:  -Group instruction provided by PowerPoint slides, verbal discussion, and written materials to support subject matter. The instructor gives an explanation and review of diabetes co-morbidities, pre- and post-prandial blood glucose goals, pre-exercise blood glucose goals, signs, symptoms, and treatment of hypoglycemia and hyperglycemia, and foot care basics.   Diabetes Blitz:  -Group instruction provided by PowerPoint slides, verbal discussion, and written materials to support subject  matter. The instructor gives an explanation and review of the physiology behind type 1 and type 2 diabetes, diabetes medications and rational behind using different medications, pre- and post-prandial blood glucose recommendations and Hemoglobin A1c goals, diabetes diet, and exercise including blood glucose guidelines for exercising safely.    Portion Distortion:  -Group instruction provided by PowerPoint slides, verbal discussion, written materials, and food models to support subject matter. The instructor gives an explanation of serving size versus portion size, changes in portions sizes over the last 20 years, and what consists of a serving from each food group.   Stress Management:  -Group instruction provided by verbal instruction, video, and written materials to support subject matter.  Instructors review role of stress in heart disease and how to cope with stress positively.     Exercising on Your Own:  -Group instruction provided by verbal instruction, power point, and written materials to support subject.  Instructors discuss benefits of exercise, components of exercise, frequency and intensity of exercise, and end points for exercise.  Also discuss use of nitroglycerin and activating EMS.  Review options of places to exercise outside of rehab.  Review guidelines for sex with heart disease.   Cardiac Drugs I:  -Group instruction provided by verbal instruction and written materials to support subject.  Instructor reviews cardiac drug classes: antiplatelets, anticoagulants, beta blockers, and statins.  Instructor discusses reasons, side effects, and lifestyle considerations for each drug class.   CARDIAC REHAB PHASE II EXERCISE from 07/07/2017 in [place]  Date  06/23/17  Instruction Review Code  2- meets goals/outcomes      Cardiac Drugs II:  -Group instruction provided by verbal instruction and written materials to support subject.  Instructor  reviews cardiac  drug classes: angiotensin converting enzyme inhibitors (ACE-I), angiotensin II receptor blockers (ARBs), nitrates, and calcium channel blockers.  Instructor discusses reasons, side effects, and lifestyle considerations for each drug class.   Anatomy and Physiology of the Circulatory System:  Group verbal and written instruction and models provide basic cardiac anatomy and physiology, with the coronary electrical and arterial systems. Review of: AMI, Angina, Valve disease, Heart Failure, Peripheral Artery Disease, Cardiac Arrhythmia, Pacemakers, and the ICD.   CARDIAC REHAB PHASE II EXERCISE from 07/07/2017 in [place]  Date  07/07/17  Instruction Review Code  2- meets goals/outcomes      Other Education:  -Group or individual verbal, written, or video instructions that support the educational goals of the cardiac rehab program.   Knowledge Questionnaire Score: Knowledge Questionnaire Score - 06/17/17 1511      Knowledge Questionnaire Score   Pre Score  22/24       Core Components/Risk Factors/Patient Goals at Admission: Personal Goals and Risk Factors at Admission - 06/17/17 1505      Core Components/Risk Factors/Patient Goals on Admission   Improve shortness of breath with ADL's  Yes    Intervention  Provide education, individualized exercise plan and daily activity instruction to help decrease symptoms of SOB with activities of daily living.    Expected Outcomes  Short Term: Achieves a reduction of symptoms when performing activities of daily living.    Hypertension  Yes    Intervention  Provide education on lifestyle modifcations including regular physical activity/exercise, weight management, moderate sodium restriction and increased consumption of fresh fruit, vegetables, and low fat dairy, alcohol moderation, and smoking cessation.;Monitor prescription use compliance.    Expected Outcomes  Short Term: Continued assessment and  intervention until BP is < 140/69mm HG in hypertensive participants. < 130/53mm HG in hypertensive participants with diabetes, heart failure or chronic kidney disease.;Long Term: Maintenance of blood pressure at goal levels.    Lipids  Yes    Intervention  Provide education and support for participant on nutrition & aerobic/resistive exercise along with prescribed medications to achieve LDL 70mg , HDL >40mg .    Expected Outcomes  Short Term: Participant states understanding of desired cholesterol values and is compliant with medications prescribed. Participant is following exercise prescription and nutrition guidelines.;Long Term: Cholesterol controlled with medications as prescribed, with individualized exercise RX and with personalized nutrition plan. Value goals: LDL < 70mg , HDL > 40 mg.       Core Components/Risk Factors/Patient Goals Review:  Goals and Risk Factor Review    Row Name 07/09/17 1533 07/09/17 1557           Core Components/Risk Factors/Patient Goals Review   Personal Goals Review  Improve shortness of breath with ADL's;Lipids  (Pended)   Improve shortness of breath with ADL's;Lipids;Hypertension      Review  -  Eddie's vital signs have been stable at cardiac rehab. Sean Riley is making slow progress      Expected Outcomes  -  Sean Riley will continue to participate in phase 2 cardiac rehab to reduce the severity of his risk factors.         Core Components/Risk Factors/Patient Goals at Discharge (Final Review):  Goals and Risk Factor Review - 07/09/17 1557      Core Components/Risk Factors/Patient Goals Review   Personal Goals Review  Improve shortness of breath with ADL's;Lipids;Hypertension    Review  Eddie's vital signs have been stable at cardiac rehab. Sean Riley is making slow progress  Expected Outcomes  Sean Riley will continue to participate in phase 2 cardiac rehab to reduce the severity of his risk factors.       ITP Comments: ITP Comments    Row Name 06/17/17 1418  07/09/17 1533         ITP Comments  Dr. Fransico Him, Medical Director   30 day ITP review. Paitent has good partipation and attendance.         Comments: See ITP comments.Barnet Pall, RN,BSN 07/09/2017 4:04 PM

## 2017-07-12 ENCOUNTER — Encounter (HOSPITAL_COMMUNITY)
Admission: RE | Admit: 2017-07-12 | Discharge: 2017-07-12 | Disposition: A | Discharge status: Home / Self Care | Payer: Medicare Other | Source: Ambulatory Visit | Attending: Internal Medicine | Admitting: Internal Medicine

## 2017-07-12 DIAGNOSIS — Z953 Presence of xenogenic heart valve: Secondary | ICD-10-CM | POA: Diagnosis not present

## 2017-07-12 DIAGNOSIS — Z87891 Personal history of nicotine dependence: Secondary | ICD-10-CM | POA: Insufficient documentation

## 2017-07-12 DIAGNOSIS — M1990 Unspecified osteoarthritis, unspecified site: Secondary | ICD-10-CM | POA: Insufficient documentation

## 2017-07-12 DIAGNOSIS — Z7982 Long term (current) use of aspirin: Secondary | ICD-10-CM | POA: Diagnosis not present

## 2017-07-12 DIAGNOSIS — Z952 Presence of prosthetic heart valve: Secondary | ICD-10-CM

## 2017-07-12 DIAGNOSIS — I4891 Unspecified atrial fibrillation: Secondary | ICD-10-CM | POA: Insufficient documentation

## 2017-07-12 DIAGNOSIS — K219 Gastro-esophageal reflux disease without esophagitis: Secondary | ICD-10-CM | POA: Insufficient documentation

## 2017-07-12 DIAGNOSIS — E785 Hyperlipidemia, unspecified: Secondary | ICD-10-CM | POA: Diagnosis not present

## 2017-07-12 DIAGNOSIS — Z79899 Other long term (current) drug therapy: Secondary | ICD-10-CM | POA: Insufficient documentation

## 2017-07-12 DIAGNOSIS — I10 Essential (primary) hypertension: Secondary | ICD-10-CM | POA: Diagnosis not present

## 2017-07-12 DIAGNOSIS — Z7951 Long term (current) use of inhaled steroids: Secondary | ICD-10-CM | POA: Diagnosis not present

## 2017-07-12 NOTE — Progress Notes (Signed)
Sean Riley 66 y.o. male DOB: 1950/12/31 MRN: [id number]      Nutrition Note  1. S/P AVR (aortic valve replacement) and aortoplasty    Meds reviewed. Megace noted Current tobacco use? No Recently quit tobacco use 03/22/17  Weight /BMI 06/17/2017 06/14/2017 05/18/2017 05/10/2017 05/04/2017  WEIGHT 169 lb 8.5 oz 172 lb 8 oz 173 lb 6 oz 172 lb 175 lb   Weight /BMI 04/16/2017 04/14/2017 04/09/2017 04/08/2017 10/05/2016  WEIGHT 183 lb 178 lb 171 lb 9.6 oz  192 lb 2 oz   Weight /BMI 07/15/2016 05/14/2016 04/30/2016 03/12/2016 01/10/2016  WEIGHT 193 lb 6.4 oz 193 lb 193 lb 191 lb 8 oz 195 lb   Weight /BMI 01/09/2016 12/24/2015 12/17/2015  WEIGHT 195 lb 12.8 oz 194 lb 9.6 oz 197 lb 6.4 oz   Nutrition Note Spoke with pt. Pt wants to regain wt lost after surgery. Pt wt today 171.6 lb, which is up 2 lb since admission. Pt states food is starting to taste better and his appetite is improving "somewhat." Nutrition plan and survey reviewed with pt. Pt encouraged to continue a high calorie, high protein diet until wt gain goal is achieved. Pt expressed understanding of the information reviewed. Pt aware of nutrition education classes offered.  Nutrition Diagnosis ? Food-and nutrition-related knowledge deficit related to lack of exposure to information as related to diagnosis of: ? CVD ? Unintentional wt loss related to decreased appetite and food intake post-operatively as evidenced by wt loss of 12% in 9 months.  Nutrition Intervention ? Pt's individual nutrition plan and goals reviewed with pt. ? Benefits of adopting Heart Healthy diet discussed when Medficts reviewed.     Nutrition Goal(s):  ? Pt to identify food quantities necessary to achieve weight gain of 6-24 lb at graduation from cardiac rehab. Goal wt of 189-192 lb desired.   Plan:  Pt to attend nutrition classes ? Nutrition I ? Nutrition II ? Portion Distortion  Will provide client-centered nutrition education as part of interdisciplinary care.    Monitor and evaluate progress toward nutrition goal with team.  Derek Mound, M.Ed, RD, LDN, CDE 07/12/2017 12:35 PM

## 2017-07-14 ENCOUNTER — Encounter (HOSPITAL_COMMUNITY)
Admission: RE | Admit: 2017-07-14 | Discharge: 2017-07-14 | Disposition: A | Discharge status: Home / Self Care | Payer: Medicare Other | Source: Ambulatory Visit | Attending: Internal Medicine | Admitting: Internal Medicine

## 2017-07-14 VITALS — Wt 170.7 lb

## 2017-07-14 DIAGNOSIS — Z79899 Other long term (current) drug therapy: Secondary | ICD-10-CM | POA: Diagnosis not present

## 2017-07-14 DIAGNOSIS — M1990 Unspecified osteoarthritis, unspecified site: Secondary | ICD-10-CM | POA: Diagnosis not present

## 2017-07-14 DIAGNOSIS — Z7951 Long term (current) use of inhaled steroids: Secondary | ICD-10-CM | POA: Diagnosis not present

## 2017-07-14 DIAGNOSIS — I4891 Unspecified atrial fibrillation: Secondary | ICD-10-CM | POA: Diagnosis not present

## 2017-07-14 DIAGNOSIS — Z7982 Long term (current) use of aspirin: Secondary | ICD-10-CM | POA: Diagnosis not present

## 2017-07-14 DIAGNOSIS — Z952 Presence of prosthetic heart valve: Secondary | ICD-10-CM

## 2017-07-14 DIAGNOSIS — Z953 Presence of xenogenic heart valve: Secondary | ICD-10-CM | POA: Diagnosis not present

## 2017-07-15 ENCOUNTER — Other Ambulatory Visit: Payer: Self-pay

## 2017-07-16 ENCOUNTER — Encounter (HOSPITAL_COMMUNITY): Payer: Medicare Other

## 2017-07-19 ENCOUNTER — Encounter (HOSPITAL_COMMUNITY): Payer: Medicare Other

## 2017-07-21 ENCOUNTER — Encounter (HOSPITAL_COMMUNITY): Payer: Medicare Other

## 2017-07-23 ENCOUNTER — Encounter (HOSPITAL_COMMUNITY): Payer: Medicare Other

## 2017-07-26 ENCOUNTER — Encounter (HOSPITAL_COMMUNITY)
Admission: RE | Admit: 2017-07-26 | Discharge: 2017-07-26 | Disposition: A | Discharge status: Home / Self Care | Payer: Medicare Other | Source: Ambulatory Visit | Attending: Internal Medicine | Admitting: Internal Medicine

## 2017-07-26 DIAGNOSIS — Z952 Presence of prosthetic heart valve: Secondary | ICD-10-CM

## 2017-07-26 NOTE — Progress Notes (Signed)
Pt arrived for exercise today.  Pt asked rehab staff to look at the back of his arm.  Pt. Left arm showed irritated abrasion-like, inflamed with swelling.   Pt stated that it itches.  Right arm with similar pattern but not as severe.  Due to swelling and concerns for possible cellulitis pt advised not to exercise.  Pt primary MD, Dr. Larose Kells called at [facility].  Pt symptoms provided, appt made for 07/28/18 at 1:30.  Informed pt and his wife of the appt.  Pt and wife verbalized understanding and is in agreement. Cherre Huger, BSN Cardiac and Training and development officer

## 2017-07-27 ENCOUNTER — Ambulatory Visit (INDEPENDENT_AMBULATORY_CARE_PROVIDER_SITE_OTHER): Payer: Medicare Other | Admitting: Internal Medicine

## 2017-07-27 ENCOUNTER — Encounter: Payer: Self-pay | Admitting: Internal Medicine

## 2017-07-27 VITALS — BP 118/68 | HR 58 | Temp 97.5°F | Resp 14 | Ht 73.0 in | Wt 171.4 lb

## 2017-07-27 DIAGNOSIS — R21 Rash and other nonspecific skin eruption: Secondary | ICD-10-CM

## 2017-07-27 DIAGNOSIS — I10 Essential (primary) hypertension: Secondary | ICD-10-CM

## 2017-07-27 MED ORDER — BETAMETHASONE DIPROPIONATE AUG 0.05 % EX CREA: TOPICAL_CREAM | Freq: Two times a day (BID) | CUTANEOUS | 0 refills | Status: DC

## 2017-07-27 NOTE — Progress Notes (Signed)
Subjective:    Patient ID: Sean Riley, male    DOB: [birth year], 66 y.o.   MRN: [id number]  DOS:  07/27/2017 Type of visit - description : Acute Interval history: Here with his wife, concerned about a rash.  Has a rash at the elbows since he was in the hospital several weeks ago, the area itches but does not hurt. He has a history of "eczema", he used to see dermatology and he was prescribed Taclonex, he probably was diagnosed with actually psoriasis. Since the last visit, he developed edema, managed conservatively, not taking Lasix at all in the last few weeks.  Doing better. Ambulatory BPs within normal per patient.    Review of Systems No fever chills No discharge from the elbows. Past Medical History:  Diagnosis Date  . Allergy   . Arthritis   . Ascending aortic dissection ([place]) 03/23/2017  . Atrial fibrillation ([place])   . Cataract    removed both eyes   . GERD (gastroesophageal reflux disease)   . HYPERLIPIDEMIA   . 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 Hypertension   . Microscopic hematuria    negative cystoscopy  . NONSPECIFIC ABNORMAL ELECTROCARDIOGRAM   . S/P aortic dissection repair 03/24/2017   Biological Bentall aortic root replacement + resection and grafting of entire ascending aorta, transverse aortic arch and proximal descending thoracic aorta with elephant trunk distal anastomosis and debranching of aortic arch vessels  . 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 Varicose veins     Past Surgical History:  Procedure Laterality Date  . APPENDECTOMY    . CARDIAC CATHETERIZATION      X3;Dr Caryl Comes, last in July, 2013  . COLONOSCOPY    . CORONARY ANGIOPLASTY     04/24/13 : he denies angioplasty  . CYSTOSCOPY  1978   Dr Hartley Barefoot  . KNEE ARTHROSCOPY   2012   Dr Theda Sers  . LUMBAR LAMINECTOMY  1987   Dr Durward Fortes  . MYELOGRAM  2007  . PERICARDIOCENTESIS N/A 04/07/2017   Procedure: PERICARDIOCENTESIS;  Surgeon: Sherren Mocha, MD;  Location: [facility];  Service: Cardiovascular;  Laterality: N/A;  . POLYPECTOMY    . REPAIR OF ACUTE ASCENDING THORACIC AORTIC DISSECTION N/A 03/23/2017   Procedure: REPAIR OF ACUTE ASCENDING THORACIC AORTIC DISSECTION.  Bentall procedure.  Aortic root repleacement with bioprosthetic valve.  Reimplantation of left and right coronary arteries.  Total resection of transverse aortic arch.  Elephant trunk distal anastomosis and debranching of arch vessels.;  Surgeon: Rexene Alberts, MD;  Location: [facility];  Service: Vascular;  Laterality: N/A;  . ROTATOR CUFF REPAIR    . SHOULDER ARTHROSCOPY  2011   Dr Theda Sers  . SHOULDER ARTHROSCOPY Right 08/2015  . SPINAL FUSION  1986   Dr Rolin Barry  . TEE WITHOUT CARDIOVERSION N/A 03/23/2017   Procedure: TRANSESOPHAGEAL ECHOCARDIOGRAM (TEE);  Surgeon: Rexene Alberts, MD;  Location: [facility];  Service: Open Heart Surgery;  Laterality: N/A;  . TRACHEOSTOMY     age 33 for croup    Social History   Socioeconomic History  . Marital status: Married    Spouse name: Not on file  . Number of children: 2  . Years of education: Not on file  . Highest education level: Not on file  Social Needs  . Financial resource strain: Not on file  . Food insecurity - worry: Not on file  . Food insecurity - inability: Not on file  . Transportation needs - medical: Not on file  . Transportation needs - non-medical: Not on file  Occupational History  . 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 Smoking status: Former Smoker    Packs/day: 2.00    Years: 45.00    Pack years: 90.00    Types: Cigarettes    Last attempt to quit: 03/22/2017    Years since quitting: 0.3  . Smokeless tobacco: Never Used  . Tobacco comment: 2 ppd , quit 03/2017  Substance and Sexual Activity  .  Alcohol use: Yes    Alcohol/week: 0.0 oz    Comment: 6-7 cans of beer daily  . Drug use: No  . Sexual activity: Not on file  Other Topics Concern  . Not on file  Social History Narrative   Lives w/ wife      Allergies as of 07/27/2017      Reactions   Simvastatin Other (See Comments)   Mental status changes   Celecoxib Other (See Comments)   GI UPSET AND INFLAMMATION   Codeine Other (See Comments)   GI UPSET AND INFLAMMATION   Nsaids Other (See Comments)   GI UPSET AND INFLAMMATION (can tolerate via IV)   Tape Other (See Comments)   Medical tape and Band-Aids PULL OFF THE SKIN; please use Coban wrap   Cephalexin Itching, Rash   Latex Rash      Medication List        Accurate as of 07/27/17  1:20 PM. Always use your most recent med list.          albuterol 108 (90 Base) MCG/ACT inhaler Commonly known as:  PROAIR HFA Inhale two puffs every four to six hours as needed for cough or wheeze.   albuterol (2.5 MG/3ML) 0.083% nebulizer solution Commonly known as:  PROVENTIL Take 3 mLs (2.5 mg total) by nebulization every 8 (eight) hours as needed for wheezing or shortness of breath.   ALPRAZolam 1 MG tablet Commonly known as:  XANAX Take 1-1.5 tablets (1-1.5 mg total) by mouth at bedtime.   aspirin 325 MG EC tablet Take 1 tablet (325 mg total) by mouth daily.   azelastine 0.1 % nasal spray Commonly known as:  ASTELIN Can use two sprays in each nostril twice daily as directed.   calcipotriene-betamethasone ointment Commonly known as:  TACLONEX Apply 1 application topically daily as needed (for eczema).   escitalopram 10 MG tablet Commonly known as:  LEXAPRO Take 1 tablet (10 mg total) by mouth daily.   esomeprazole 40 MG capsule Commonly known as:  NEXIUM Take 1 capsule (40 mg total) by mouth daily before breakfast.   flecainide 100 MG tablet Commonly known as:  TAMBOCOR Take 1 tablet (100 mg total) by mouth 2 (two) times daily.   fluticasone 50 MCG/ACT  nasal spray Commonly known as:  FLONASE Use one spray in each nostril twice daily.   furosemide 40 MG tablet Commonly known as:  LASIX Take 1 tablet (40 mg total) by mouth daily as needed.   ipratropium 0.06 % nasal spray Commonly known as:  ATROVENT Can use two sprays in each nostril every six hours as needed to dry up nose.   megestrol 400 MG/10ML suspension Commonly known as:  MEGACE Take 10 mLs (400 mg total) daily by mouth.  metoprolol tartrate 25 MG tablet Commonly known as:  LOPRESSOR Take 1 tablet (25 mg total) by mouth 2 (two) times daily.   potassium chloride SA 20 MEQ tablet Commonly known as:  K-DUR,KLOR-CON Take 1 tablet (20 mEq total) by mouth daily as needed.   rosuvastatin 5 MG tablet Commonly known as:  CRESTOR Take 5 mg by mouth every other day.          Objective:   Physical Exam BP 118/68 (BP Location: Right Arm, Patient Position: Sitting, Cuff Size: Small)   Pulse (!) 58   Temp (!) 97.5 F (36.4 C) (Oral)   Resp 14   Ht 6\' 1"  (1.854 m)   Wt 171 lb 6 oz (77.7 kg)   SpO2 95%   BMI 22.61 kg/m  General:   Well developed, well nourished . NAD.  HEENT:  Normocephalic . Face symmetric, atraumatic LE: No pretibial edema bilaterally  Skin: Not pale. Not jaundice.  See pictures Neurologic:  alert & oriented X3.  Speech normal, gait appropriate for age and unassisted Psych--  Cognition and judgment appear intact.  Cooperative with normal attention span and concentration.  Behavior appropriate. No anxious or depressed appearing.          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 Tried a cpap. [place] (-) 07-2015 CV: --P- Atrial fibrillation Dr Caryl Comes, on NSR, no anticoag --lexiscan 2013 --Cardiac catheterization July 2013: Normal LV FX,, mild  nonobstructive CAD Dr. Angelena Form --thoracis Ao  dissection , acute, surgery 03-2017 GU: BPH, microscopic hematuria (negative cystoscopy 1978) Elevated PSA: saw urology  04-2016 Gynecomastia, Dr. Loanne Drilling  >> Bx (-) 10-2014 ; took tamoxifen temporarily (self d/c 06-2016), improved Hypogonadism-- dx per Dr Loanne Drilling, numbers improved d/t nolvadex  Sees dermatology : eczema? rx taclonex H/o  Tracheostomy , age 38, croup  PLAN: Rash: Rash on the elbows, L>R,since he was admitted to the hospital and was critically ill for several days.  He has a history of "eczema" but I wonder if he truly has psoriasis.  He was previously prescribed Taclonex but has not been using it.  Plan: Recommend betamethasone,  twice a day for 2 weeks.  Call if no better. HTN: Well-controlled, currently on metoprolol. Edema: Not an issue at this time, not taking Lasix Hyponatremia: Due for a BMP in about 2 weeks. Next routine visit in approximately 2 months

## 2017-07-27 NOTE — Progress Notes (Signed)
Pre visit review using our clinic review tool, if applicable. No additional management support is needed unless otherwise documented below in the visit note. 

## 2017-07-27 NOTE — Patient Instructions (Signed)
Apply betamethasone twice a day to the elbows for 10 days to two weeks  Call if no better

## 2017-07-28 ENCOUNTER — Encounter (HOSPITAL_COMMUNITY)
Admission: RE | Admit: 2017-07-28 | Discharge: 2017-07-28 | Disposition: A | Discharge status: Home / Self Care | Payer: Medicare Other | Source: Ambulatory Visit

## 2017-07-28 DIAGNOSIS — Z952 Presence of prosthetic heart valve: Secondary | ICD-10-CM

## 2017-07-28 NOTE — Assessment & Plan Note (Signed)
Rash: Rash on the elbows, L>R,since he was admitted to the hospital and was critically ill for several days.  He has a history of "eczema" but I wonder if he truly has psoriasis.  He was previously prescribed Taclonex but has not been using it.  Plan: Recommend betamethasone,  twice a day for 2 weeks.  Call if no better. HTN: Well-controlled, currently on metoprolol. Edema: Not an issue at this time, not taking Lasix Hyponatremia: Due for a BMP in about 2 weeks. Next routine visit in approximately 2 months

## 2017-07-30 ENCOUNTER — Encounter (HOSPITAL_COMMUNITY): Payer: Medicare Other

## 2017-08-02 ENCOUNTER — Encounter (HOSPITAL_COMMUNITY): Payer: Medicare Other

## 2017-08-04 ENCOUNTER — Encounter (HOSPITAL_COMMUNITY): Payer: Medicare Other

## 2017-08-05 ENCOUNTER — Telehealth (HOSPITAL_COMMUNITY): Payer: Self-pay | Admitting: *Deleted

## 2017-08-05 ENCOUNTER — Encounter (HOSPITAL_COMMUNITY): Payer: Self-pay | Admitting: *Deleted

## 2017-08-05 DIAGNOSIS — Z952 Presence of prosthetic heart valve: Secondary | ICD-10-CM

## 2017-08-05 NOTE — Progress Notes (Signed)
Cardiac Individual Treatment Plan  Patient Details  Name: Sean Riley MRN: [id number] Date of Birth: 1950-11-15 Referring Provider:     CARDIAC REHAB PHASE II ORIENTATION from 06/17/2017 in [place]  Referring Provider  Verdie Mosher MD      Initial Encounter Date:    CARDIAC REHAB PHASE II ORIENTATION from 06/17/2017 in [facility]  Date  06/17/17  Referring Provider  Verdie Mosher MD      Visit Diagnosis: S/P AVR (aortic valve replacement) and aortoplasty  Patient's Home Medications on Admission:  Current Outpatient Medications:  .  albuterol (PROAIR HFA) 108 (90 Base) MCG/ACT inhaler, Inhale two puffs every four to six hours as needed for cough or wheeze. (Patient taking differently: Inhale 2 puffs into the lungs See admin instructions. Every 4-6 hours as needed for coughing or wheezing), Disp: 3 Inhaler, Rfl: 0 .  albuterol (PROVENTIL) (2.5 MG/3ML) 0.083% nebulizer solution, Take 3 mLs (2.5 mg total) by nebulization every 8 (eight) hours as needed for wheezing or shortness of breath. (Patient not taking: Reported on 06/17/2017), Disp: 150 mL, Rfl: 1 .  ALPRAZolam (XANAX) 1 MG tablet, Take 1-1.5 tablets (1-1.5 mg total) by mouth at bedtime., Disp: 40 tablet, Rfl: 1 .  aspirin EC 325 MG EC tablet, Take 1 tablet (325 mg total) by mouth daily., Disp: 30 tablet, Rfl: 0 .  augmented betamethasone dipropionate (DIPROLENE-AF) 0.05 % cream, Apply topically 2 (two) times daily., Disp: 100 g, Rfl: 0 .  azelastine (ASTELIN) 0.1 % nasal spray, Can use two sprays in each nostril twice daily as directed. (Patient taking differently: Place 2 sprays into both nostrils 2 (two) times daily. ), Disp: 90 mL, Rfl: 3 .  calcipotriene-betamethasone (TACLONEX) ointment, Apply 1 application topically daily as needed (for eczema). , Disp: , Rfl:  .  escitalopram (LEXAPRO) 10 MG tablet, Take 1 tablet (10 mg total) by mouth daily., Disp: 30 tablet,  Rfl: 6 .  esomeprazole (NEXIUM) 40 MG capsule, Take 1 capsule (40 mg total) by mouth daily before breakfast., Disp: 90 capsule, Rfl: 3 .  flecainide (TAMBOCOR) 100 MG tablet, Take 1 tablet (100 mg total) by mouth 2 (two) times daily., Disp: 180 tablet, Rfl: 3 .  fluticasone (FLONASE) 50 MCG/ACT nasal spray, Use one spray in each nostril twice daily., Disp: 48 g, Rfl: 3 .  furosemide (LASIX) 40 MG tablet, Take 1 tablet (40 mg total) by mouth daily as needed. (Patient not taking: Reported on 07/27/2017), Disp: 30 tablet, Rfl: 0 .  ipratropium (ATROVENT) 0.06 % nasal spray, Can use two sprays in each nostril every six hours as needed to dry up nose. (Patient not taking: Reported on 06/17/2017), Disp: 45 mL, Rfl: 3 .  metoprolol tartrate (LOPRESSOR) 25 MG tablet, Take 1 tablet (25 mg total) by mouth 2 (two) times daily., Disp: 180 tablet, Rfl: 3 .  potassium chloride SA (K-DUR,KLOR-CON) 20 MEQ tablet, Take 1 tablet (20 mEq total) by mouth daily as needed. (Patient not taking: Reported on 06/14/2017), Disp: 30 tablet, Rfl: 0 .  rosuvastatin (CRESTOR) 5 MG tablet, Take 5 mg by mouth every other day., Disp: , Rfl:   Past Medical History: Past Medical History:  Diagnosis Date  . Allergy   . Arthritis   . Ascending aortic dissection ([place]) 03/23/2017  . Atrial fibrillation ([place])   . Cataract    removed both eyes   . GERD (gastroesophageal reflux disease)   . HYPERLIPIDEMIA   .  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 Hypertension   . Microscopic hematuria    negative cystoscopy  . NONSPECIFIC ABNORMAL ELECTROCARDIOGRAM   . S/P aortic dissection repair 03/24/2017   Biological Bentall aortic root replacement + resection and grafting of entire ascending aorta, transverse aortic arch and proximal descending thoracic aorta with elephant trunk distal anastomosis and debranching of aortic arch vessels  . 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 Varicose veins     Tobacco Use: Social History   Tobacco Use  Smoking Status Former Smoker  . Packs/day: 2.00  . Years: 45.00  . Pack years: 90.00  . Types: Cigarettes  . Last attempt to quit: 03/22/2017  . Years since quitting: 0.3  Smokeless Tobacco Never Used  Tobacco Comment   2 ppd , quit 03/2017    Labs: Recent Review Flowsheet Data    Labs for ITP Cardiac and Pulmonary Rehab Latest Ref Rng & Units 04/03/2017 04/04/2017 04/05/2017 04/07/2017 06/14/2017   Cholestrol 0 - 200 mg/dL - - - - 145   LDLCALC 0 - 99 mg/dL - - - - 93   LDLDIRECT mg/dL - - - - -   HDL >39.00 mg/dL - - - - 34.40(L)   Trlycerides 0.0 - 149.0 mg/dL - - - - 88.0   Hemoglobin A1c 4.8 - 5.6 % - 5.5 - - -   PHART 7.350 - 7.450 - - - 7.523(H) -   PCO2ART 32.0 - 48.0 mmHg - - - 29.3(L) -   HCO3 20.0 - 28.0 mmol/L - - - 24.1 -   TCO2 22 - 32 mmol/L - - - 25 -   ACIDBASEDEF 0.0 - 2.0 mmol/L - - - - -   O2SAT % 46.7 63.5 48.2 95.0 -      Capillary Blood Glucose: Lab Results  Component Value Date   GLUCAP 98 04/08/2017   GLUCAP 64 (L) 04/07/2017   GLUCAP 139 (H) 03/26/2017   GLUCAP 145 (H) 03/26/2017   GLUCAP 120 (H) 03/26/2017     Exercise Target Goals:    Exercise Program Goal: Individual exercise prescription set with THRR, safety & activity barriers. Participant demonstrates ability to understand and report RPE using BORG scale, to self-measure pulse accurately, and to acknowledge the importance of the exercise prescription.  Exercise Prescription Goal: Starting with aerobic activity 30 plus minutes a day, 3 days per week for initial exercise prescription. Provide home exercise prescription and guidelines that participant acknowledges understanding prior to discharge.  Activity Barriers & Risk Stratification: Activity Barriers & Cardiac Risk  Stratification - 06/17/17 1500      Activity Barriers & Cardiac Risk Stratification   Activity Barriers  Assistive Device;Other (comment);Deconditioning;Muscular Weakness;Joint Problems    Comments  R RTC surgery 3, L elbow surgery x2; B Knee scopes x2    Cardiac Risk Stratification  High       6 Minute Walk: 6 Minute Walk    Row Name 06/17/17 1500 06/17/17 1631       6 Minute Walk   Phase  Initial  -    Distance  705 feet  -    Walk Time  6 minutes  -    # of Rest Breaks  0  -    MPH  1.34  -  METS  2.04  -    RPE  11  -    VO2 Peak  7.15  -    Symptoms  No  -    Resting HR  60 bpm  -    Resting BP  138/80  -    Resting Oxygen Saturation   -  96 %    Exercise Oxygen Saturation  during 6 min walk  -  96 %    Max Ex. HR  73 bpm  -    Max Ex. BP  124/72  -       Oxygen Initial Assessment:   Oxygen Re-Evaluation:   Oxygen Discharge (Final Oxygen Re-Evaluation):   Initial Exercise Prescription: Initial Exercise Prescription - 06/17/17 1500      Date of Initial Exercise RX and Referring Provider   Date  06/17/17    Referring Provider  Verdie Mosher MD      NuStep   Level  1    SPM  60    Minutes  10    METs  1.5      Arm Ergometer   Level  1    RPM  10    Minutes  10    METs  1.4      Track   Laps  5    Minutes  10    METs  1.84      Prescription Details   Frequency (times per week)  3    Duration  Progress to 30 minutes of continuous aerobic without signs/symptoms of physical distress      Intensity   THRR 40-80% of Max Heartrate  61-122    Ratings of Perceived Exertion  11-15    Perceived Dyspnea  0-4      Progression   Progression  Continue to progress workloads to maintain intensity without signs/symptoms of physical distress.      Resistance Training   Training Prescription  Yes    Weight  1lb    Reps  10-15       Perform Capillary Blood Glucose checks as needed.  Exercise Prescription Changes: Exercise Prescription Changes     Row Name 06/21/17 1518 07/05/17 1545 07/14/17 1502         Response to Exercise   Blood Pressure (Admit)  112/70  118/78  118/70     Blood Pressure (Exercise)  123/76  122/64  112/80     Blood Pressure (Exit)  100/80  104/74  120/72     Heart Rate (Admit)  68 bpm  67 bpm  66 bpm     Heart Rate (Exercise)  68 bpm  83 bpm  74 bpm     Heart Rate (Exit)  58 bpm  61 bpm  53 bpm     Symptoms  none  none  none     Comments  BP dropped with exercise. Encouraged hydration  -  -     Duration  Continue with 30 min of aerobic exercise without signs/symptoms of physical distress.  Continue with 30 min of aerobic exercise without signs/symptoms of physical distress.  Continue with 30 min of aerobic exercise without signs/symptoms of physical distress.     Intensity  THRR unchanged  THRR unchanged  THRR unchanged       Progression   Progression  Continue to progress workloads to maintain intensity without signs/symptoms of physical distress.  Continue to progress workloads to maintain intensity without signs/symptoms of physical distress.  Continue to  progress workloads to maintain intensity without signs/symptoms of physical distress.     Average METs  1.5  2.2  2.5       Resistance Training   Training Prescription  Yes  Yes  No relaxation day     Weight  1lb  2lbs  -     Reps  10-15  10-15  -     Time  10 Minutes  10 Minutes  -       NuStep   Level  1  2  -     SPM  60  70  -     Minutes  10  10  -     METs  1.2  1.8  -       Arm Ergometer   Level  1  1  1      RPM  10  -  -     Minutes  10  15  15      METs  1.4  2.67  2.67       Track   Laps  5  8  11      Minutes  10  10  15      METs  1.84  2.39  2.28       Home Exercise Plan   Plans to continue exercise at  -  -  Home (comment) chair exercises     Frequency  -  -  Add 2 additional days to program exercise sessions.     Initial Home Exercises Provided  -  -  07/07/17        Exercise Comments: Exercise Comments    Row Name  06/24/17 1522 07/07/17 1550         Exercise Comments  Pt was oriented to exercise equipment on 06/21/17. Pt responded well to exercise prescription; will continue to monitor.   Reviewed HEP 07/07/17         Exercise Goals and Review: Exercise Goals    Row Name 06/17/17 1427             Exercise Goals   Increase Physical Activity  Yes       Intervention  Provide advice, education, support and counseling about physical activity/exercise needs.;Develop an individualized exercise prescription for aerobic and resistive training based on initial evaluation findings, risk stratification, comorbidities and participant's personal goals.       Expected Outcomes  Achievement of increased cardiorespiratory fitness and enhanced flexibility, muscular endurance and strength shown through measurements of functional capacity and personal statement of participant.       Increase Strength and Stamina  Yes       Intervention  Provide advice, education, support and counseling about physical activity/exercise needs.;Develop an individualized exercise prescription for aerobic and resistive training based on initial evaluation findings, risk stratification, comorbidities and participant's personal goals.       Expected Outcomes  Achievement of increased cardiorespiratory fitness and enhanced flexibility, muscular endurance and strength shown through measurements of functional capacity and personal statement of participant.       Able to understand and use rate of perceived exertion (RPE) scale  Yes       Intervention  Provide education and explanation on how to use RPE scale       Expected Outcomes  Short Term: Able to use RPE daily in rehab to express subjective intensity level;Long Term:  Able to use RPE to guide intensity level when exercising independently  Knowledge and understanding of Target Heart Rate Range (THRR)  Yes       Intervention  Provide education and explanation of THRR including how the  numbers were predicted and where they are located for reference       Expected Outcomes  Short Term: Able to state/look up THRR;Long Term: Able to use THRR to govern intensity when exercising independently;Short Term: Able to use daily as guideline for intensity in rehab       Able to check pulse independently  Yes       Intervention  Provide education and demonstration on how to check pulse in carotid and radial arteries.;Review the importance of being able to check your own pulse for safety during independent exercise       Expected Outcomes  Short Term: Able to explain why pulse checking is important during independent exercise;Long Term: Able to check pulse independently and accurately       Understanding of Exercise Prescription  Yes       Intervention  Provide education, explanation, and written materials on patient's individual exercise prescription       Expected Outcomes  Short Term: Able to explain program exercise prescription;Long Term: Able to explain home exercise prescription to exercise independently          Exercise Goals Re-Evaluation : Exercise Goals Re-Evaluation    Row Name 07/06/17 1547 07/07/17 1548           Exercise Goal Re-Evaluation   Exercise Goals Review  Increase Physical Activity;Able to understand and use rate of perceived exertion (RPE) scale;Understanding of Exercise Prescription;Increase Strength and Stamina  Increase Physical Activity;Understanding of Exercise Prescription;Knowledge and understanding of Target Heart Rate Range (THRR);Increase Strength and Stamina;Able to check pulse independently;Able to understand and use rate of perceived exertion (RPE) scale      Comments  Pt is making great progress in cardiac rehab and has a great understanding of Ex Rx and demonstrate proper use of RPE scale. Pt has also increased handheld weights from 1lbs to 2lbs to increase strength and postural muscles.  Reviewed home exercise with pt today.  Pt plans to do seated  chair exercises 2-3x/week.  Reviewed THR, pulse, RPE, sign and symptoms, NTG use, and when to call 911 or MD.  Also discussed weather considerations and indoor options.  Pt voiced understanding.      Expected Outcomes  Pt will continue to improve in cardiorespiratory fitness and musculoskeletal strength  Pt will continue to improve in cardiorespiratory fitness and musculoskeletal strength          Discharge Exercise Prescription (Final Exercise Prescription Changes): Exercise Prescription Changes - 07/14/17 1502      Response to Exercise   Blood Pressure (Admit)  118/70    Blood Pressure (Exercise)  112/80    Blood Pressure (Exit)  120/72    Heart Rate (Admit)  66 bpm    Heart Rate (Exercise)  74 bpm    Heart Rate (Exit)  53 bpm    Symptoms  none    Duration  Continue with 30 min of aerobic exercise without signs/symptoms of physical distress.    Intensity  THRR unchanged      Progression   Progression  Continue to progress workloads to maintain intensity without signs/symptoms of physical distress.    Average METs  2.5      Resistance Training   Training Prescription  No relaxation day      Arm Ergometer   Level  1  Minutes  15    METs  2.67      Track   Laps  11    Minutes  15    METs  2.28      Home Exercise Plan   Plans to continue exercise at  Home (comment) chair exercises    Frequency  Add 2 additional days to program exercise sessions.    Initial Home Exercises Provided  07/07/17       Nutrition:  Target Goals: Understanding of nutrition guidelines, daily intake of sodium 1500mg , cholesterol 200mg , calories 30% from fat and 7% or less from saturated fats, daily to have 5 or more servings of fruits and vegetables.  Biometrics: Pre Biometrics - 06/17/17 1504      Pre Biometrics   Waist Circumference  37.5 inches    Hip Circumference  40.5 inches    Waist to Hip Ratio  0.93 %    Triceps Skinfold  16 mm    % Body Fat  24.9 %    Grip Strength  31 kg     Flexibility  7.5 in    Single Leg Stand  0.56 seconds        Nutrition Therapy Plan and Nutrition Goals: Nutrition Therapy & Goals - 06/18/17 1334      Nutrition Therapy   Diet  Therapeutic Lifestyle Changes      Personal Nutrition Goals   Nutrition Goal  Pt to identify food quantities necessary to achieve weight gain of 6-24 lb at graduation from cardiac rehab. Goal wt of 189-192 lb desired.       Intervention Plan   Intervention  Prescribe, educate and counsel regarding individualized specific dietary modifications aiming towards targeted core components such as weight, hypertension, lipid management, diabetes, heart failure and other comorbidities.    Expected Outcomes  Short Term Goal: Understand basic principles of dietary content, such as calories, fat, sodium, cholesterol and nutrients.;Long Term Goal: Adherence to prescribed nutrition plan.       Nutrition Discharge: Nutrition Scores: Nutrition Assessments - 06/18/17 1334      MEDFICTS Scores   Pre Score  24       Nutrition Goals Re-Evaluation:   Nutrition Goals Re-Evaluation:   Nutrition Goals Discharge (Final Nutrition Goals Re-Evaluation):   Psychosocial: Target Goals: Acknowledge presence or absence of significant depression and/or stress, maximize coping skills, provide positive support system. Participant is able to verbalize types and ability to use techniques and skills needed for reducing stress and depression.  Initial Review & Psychosocial Screening: Initial Psych Review & Screening - 06/17/17 1602      Initial Review   Current issues with  Current Anxiety/Panic Pt takes Lexapro to "chill" out and xanax at bedtime to help with sleep.  Pt feel these medications work well for him.      Family Dynamics   Good Support System?  Yes Pt wife accompanied him to his orientation appt.  Pt feels supported by his wife and family.      Barriers   Psychosocial barriers to participate in program  The patient  should benefit from training in stress management and relaxation.      Screening Interventions   Interventions  Encouraged to exercise       Quality of Life Scores: Quality of Life - 06/17/17 1506      Quality of Life Scores   Health/Function Pre  21.75 %    Socioeconomic Pre  29.17 %    Psych/Spiritual Pre  25.71 %  Family Pre  30 %    GLOBAL Pre  26.12 %       PHQ-9: Recent Review Flowsheet Data    Depression screen [facility] 2/9 06/21/2017 03/12/2016 03/11/2015 04/24/2013   Decreased Interest 0 0 0 0   Down, Depressed, Hopeless 0 0 0 0   PHQ - 2 Score 0 0 0 0     Interpretation of Total Score  Total Score Depression Severity:  1-4 = Minimal depression, 5-9 = Mild depression, 10-14 = Moderate depression, 15-19 = Moderately severe depression, 20-27 = Severe depression   Psychosocial Evaluation and Intervention:   Psychosocial Re-Evaluation: Psychosocial Re-Evaluation    Row Name 07/09/17 1601 08/05/17 1348           Psychosocial Re-Evaluation   Current issues with  Current Stress Concerns  Current Stress Concerns      Comments  Sean Riley has stressors due to his current state of health  Sean Riley has stressors due to his current state of health      Expected Outcomes  Will offer emotional support as needed  Will offer emotional support as needed      Interventions  Stress management education;Encouraged to attend Cardiac Rehabilitation for the exercise  Stress management education;Encouraged to attend Cardiac Rehabilitation for the exercise      Continue Psychosocial Services   Follow up required by staff  Follow up required by staff        Initial Review   Source of Stress Concerns  Chronic Illness  Chronic Illness         Psychosocial Discharge (Final Psychosocial Re-Evaluation): Psychosocial Re-Evaluation - 08/05/17 1348      Psychosocial Re-Evaluation   Current issues with  Current Stress Concerns    Comments  Sean Riley has stressors due to his current state of health     Expected Outcomes  Will offer emotional support as needed    Interventions  Stress management education;Encouraged to attend Cardiac Rehabilitation for the exercise    Continue Psychosocial Services   Follow up required by staff      Initial Review   Source of Stress Concerns  Chronic Illness       Vocational Rehabilitation: Provide vocational rehab assistance to qualifying candidates.   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 Participant will state understanding/return demonstration of topics presented.  Learning Barriers/Preferences: Learning Barriers/Preferences - 06/17/17 1459      Learning Barriers/Preferences   Learning Barriers  Hearing    Learning Preferences  Video;Skilled Demonstration       Education Topics: Count Your Pulse:  -Group instruction provided by verbal instruction, demonstration, patient participation and written materials to support subject.  Instructors address importance of being able to find your pulse and how to count your pulse when at home without a heart monitor.  Patients get hands on experience counting their pulse with staff help and individually.   Heart Attack, Angina, and Risk Factor Modification:  -Group instruction provided by verbal instruction, video, and written materials to support subject.  Instructors address signs and symptoms of angina and heart attacks.    Also discuss risk factors for heart disease and how to make changes to improve  heart health risk factors.   Functional Fitness:  -Group instruction provided by verbal instruction, demonstration, patient participation, and written materials to support subject.   Instructors address safety measures for doing things around the house.  Discuss how to get up and down off the floor, how to pick things up properly, how to safely get out of a chair without assistance, and balance training.   Meditation and Mindfulness:  -Group instruction provided by verbal instruction, patient participation, and written materials to support subject.  Instructor addresses importance of mindfulness and meditation practice to help reduce stress and improve awareness.  Instructor also leads participants through a meditation exercise.    CARDIAC REHAB PHASE II EXERCISE from 07/14/2017 in [place]  Date  07/14/17  Instruction Review Code  2- meets goals/outcomes      Stretching for Flexibility and Mobility:  -Group instruction provided by verbal instruction, patient participation, and written materials to support subject.  Instructors lead participants through series of stretches that are designed to increase flexibility thus improving mobility.  These stretches are additional exercise for major muscle groups that are typically performed during regular warm up and cool down.   Hands Only CPR:  -Group verbal, video, and participation provides a basic overview of AHA guidelines for community CPR. Role-play of emergencies allow participants the opportunity to practice calling for help and chest compression technique with discussion of AED use.   Hypertension: -Group verbal and written instruction that provides a basic overview of hypertension including the most recent diagnostic guidelines, risk factor reduction with self-care instructions and medication management.    Nutrition I class: Heart Healthy Eating:  -Group instruction provided by PowerPoint slides, verbal discussion, and written materials to support subject matter. The instructor gives an explanation and review of the Therapeutic Lifestyle Changes diet recommendations, which includes a  discussion on lipid goals, dietary fat, sodium, fiber, plant stanol/sterol esters, sugar, and the components of a well-balanced, healthy diet.   Nutrition II class: Lifestyle Skills:  -Group instruction provided by PowerPoint slides, verbal discussion, and written materials to support subject matter. The instructor gives an explanation and review of label reading, grocery shopping for heart health, heart healthy recipe modifications, and ways to make healthier choices when eating out.   Diabetes Question & Answer:  -Group instruction provided by PowerPoint slides, verbal discussion, and written materials to support subject matter. The instructor gives an explanation and review of diabetes co-morbidities, pre- and post-prandial blood glucose goals, pre-exercise blood glucose goals, signs, symptoms, and treatment of hypoglycemia and hyperglycemia, and foot care basics.   Diabetes Blitz:  -Group instruction provided by PowerPoint slides, verbal discussion, and written materials to support subject matter. The instructor gives an explanation and review of the physiology behind type 1 and type 2 diabetes, diabetes medications and rational behind using different medications, pre- and post-prandial blood glucose recommendations and Hemoglobin A1c goals, diabetes diet, and exercise including blood glucose guidelines for exercising safely.    Portion Distortion:  -Group instruction provided by PowerPoint slides, verbal discussion, written materials, and food models to support subject matter. The instructor gives an explanation of serving size versus portion size, changes in portions sizes over the last 20 years, and what consists of a serving from each food group.   Stress Management:  -Group instruction provided by verbal instruction, video, and written materials to support subject matter.  Instructors review role of stress in heart disease and how to cope with stress positively.  Exercising on Your  Own:  -Group instruction provided by verbal instruction, power point, and written materials to support subject.  Instructors discuss benefits of exercise, components of exercise, frequency and intensity of exercise, and end points for exercise.  Also discuss use of nitroglycerin and activating EMS.  Review options of places to exercise outside of rehab.  Review guidelines for sex with heart disease.   Cardiac Drugs I:  -Group instruction provided by verbal instruction and written materials to support subject.  Instructor reviews cardiac drug classes: antiplatelets, anticoagulants, beta blockers, and statins.  Instructor discusses reasons, side effects, and lifestyle considerations for each drug class.   CARDIAC REHAB PHASE II EXERCISE from 07/14/2017 in [facility]  Date  06/23/17  Instruction Review Code  2- meets goals/outcomes      Cardiac Drugs II:  -Group instruction provided by verbal instruction and written materials to support subject.  Instructor reviews cardiac drug classes: angiotensin converting enzyme inhibitors (ACE-I), angiotensin II receptor blockers (ARBs), nitrates, and calcium channel blockers.  Instructor discusses reasons, side effects, and lifestyle considerations for each drug class.   Anatomy and Physiology of the Circulatory System:  Group verbal and written instruction and models provide basic cardiac anatomy and physiology, with the coronary electrical and arterial systems. Review of: AMI, Angina, Valve disease, Heart Failure, Peripheral Artery Disease, Cardiac Arrhythmia, Pacemakers, and the ICD.   CARDIAC REHAB PHASE II EXERCISE from 07/14/2017 in [facility]  Date  07/07/17  Instruction Review Code  2- meets goals/outcomes      Other Education:  -Group or individual verbal, written, or video instructions that support the educational goals of the cardiac rehab program.   Knowledge Questionnaire  Score: Knowledge Questionnaire Score - 06/17/17 1511      Knowledge Questionnaire Score   Pre Score  22/24       Core Components/Risk Factors/Patient Goals at Admission: Personal Goals and Risk Factors at Admission - 06/17/17 1505      Core Components/Risk Factors/Patient Goals on Admission   Improve shortness of breath with ADL's  Yes    Intervention  Provide education, individualized exercise plan and daily activity instruction to help decrease symptoms of SOB with activities of daily living.    Expected Outcomes  Short Term: Achieves a reduction of symptoms when performing activities of daily living.    Hypertension  Yes    Intervention  Provide education on lifestyle modifcations including regular physical activity/exercise, weight management, moderate sodium restriction and increased consumption of fresh fruit, vegetables, and low fat dairy, alcohol moderation, and smoking cessation.;Monitor prescription use compliance.    Expected Outcomes  Short Term: Continued assessment and intervention until BP is < 140/36mm HG in hypertensive participants. < 130/66mm HG in hypertensive participants with diabetes, heart failure or chronic kidney disease.;Long Term: Maintenance of blood pressure at goal levels.    Lipids  Yes    Intervention  Provide education and support for participant on nutrition & aerobic/resistive exercise along with prescribed medications to achieve LDL 70mg , HDL >40mg .    Expected Outcomes  Short Term: Participant states understanding of desired cholesterol values and is compliant with medications prescribed. Participant is following exercise prescription and nutrition guidelines.;Long Term: Cholesterol controlled with medications as prescribed, with individualized exercise RX and with personalized nutrition plan. Value goals: LDL < 70mg , HDL > 40 mg.       Core Components/Risk Factors/Patient Goals Review:  Goals and Risk Factor Review    Row  Name 07/09/17 1533 07/09/17  1557 08/05/17 1347         Core Components/Risk Factors/Patient Goals Review   Personal Goals Review  Improve shortness of breath with ADL's;Lipids  (Pended)   Improve shortness of breath with ADL's;Lipids;Hypertension  Improve shortness of breath with ADL's;Lipids;Hypertension     Review  -  Sean Riley's vital signs have been stable at cardiac rehab. Sean Riley is making slow progress  Sean Riley's vital signs have been stable at cardiac rehab. Sean Riley is making slow progress, Sean Riley has not been here since 07/14/17.     Expected Outcomes  -  Sean Riley will continue to participate in phase 2 cardiac rehab to reduce the severity of his risk factors.  Sean Riley will continue to participate in phase 2 cardiac rehab to reduce the severity of his risk factors.        Core Components/Risk Factors/Patient Goals at Discharge (Final Review):  Goals and Risk Factor Review - 08/05/17 1347      Core Components/Risk Factors/Patient Goals Review   Personal Goals Review  Improve shortness of breath with ADL's;Lipids;Hypertension    Review  Sean Riley's vital signs have been stable at cardiac rehab. Sean Riley is making slow progress, Sean Riley has not been here since 07/14/17.    Expected Outcomes  Sean Riley will continue to participate in phase 2 cardiac rehab to reduce the severity of his risk factors.       ITP Comments: ITP Comments    Row Name 06/17/17 1418 07/09/17 1533 08/05/17 1344 08/05/17 1345     ITP Comments  Dr. Fransico Him, Medical Director   30 day ITP review. Paitent has good partipation and attendance.   30 day ITP review. Paitent has good partipation and attendance.   30 day ITP review. Sean Riley not been able to exercise due to a rash on his elbows which is clearing up. Sean Riley plans to retrun to exercise next week.       Comments: See ITP comments.Barnet Pall, RN,BSN 08/05/2017 1:52 PM

## 2017-08-06 ENCOUNTER — Encounter (HOSPITAL_COMMUNITY): Payer: Medicare Other

## 2017-08-09 ENCOUNTER — Encounter (HOSPITAL_COMMUNITY): Payer: Medicare Other

## 2017-08-09 ENCOUNTER — Other Ambulatory Visit: Payer: Self-pay | Admitting: Thoracic Surgery (Cardiothoracic Vascular Surgery)

## 2017-08-09 DIAGNOSIS — I7101 Dissection of ascending aorta: Secondary | ICD-10-CM

## 2017-08-09 NOTE — Progress Notes (Unsigned)
CT A CHEST

## 2017-08-11 ENCOUNTER — Encounter (HOSPITAL_COMMUNITY): Payer: Medicare Other

## 2017-08-11 ENCOUNTER — Telehealth: Payer: Self-pay | Admitting: Internal Medicine

## 2017-08-11 ENCOUNTER — Telehealth (HOSPITAL_COMMUNITY): Payer: Self-pay | Admitting: Internal Medicine

## 2017-08-11 MED ORDER — ALPRAZOLAM 1 MG PO TABS: 1.0000 mg | ORAL_TABLET | Freq: Every day | ORAL | 1 refills | Status: DC

## 2017-08-11 NOTE — Telephone Encounter (Signed)
Copied from [place] [phone]. Topic: Quick Communication - Rx Refill/Question >> Aug 11, 2017 10:32 AM Oliver Pila B wrote: Pt contacted pharmacy no refills left, pt is requesting Rx of  ALPRAZolam Duanne Moron) 1 MG tablet [[id number]], contact pt or pharmacy if needed

## 2017-08-11 NOTE — Telephone Encounter (Signed)
sent 

## 2017-08-11 NOTE — Telephone Encounter (Signed)
Pt is requesting refill on alprazolam.   Last OV: 07/27/2017 Last Fill: 05/18/2017 #40 and 1RF UDS: 05/18/2017 Low risk  NCCR printed; no issues noted    Please advise.

## 2017-08-13 ENCOUNTER — Encounter (HOSPITAL_COMMUNITY): Payer: Medicare Other

## 2017-08-16 ENCOUNTER — Encounter (HOSPITAL_COMMUNITY): Payer: Medicare Other

## 2017-08-17 ENCOUNTER — Telehealth: Payer: Self-pay | Admitting: Internal Medicine

## 2017-08-17 ENCOUNTER — Encounter: Payer: Self-pay | Admitting: Internal Medicine

## 2017-08-17 ENCOUNTER — Ambulatory Visit (INDEPENDENT_AMBULATORY_CARE_PROVIDER_SITE_OTHER): Payer: Medicare Other | Admitting: Internal Medicine

## 2017-08-17 VITALS — BP 154/83 | HR 63 | Ht 72.0 in | Wt 174.5 lb

## 2017-08-17 DIAGNOSIS — I10 Essential (primary) hypertension: Secondary | ICD-10-CM | POA: Diagnosis not present

## 2017-08-17 DIAGNOSIS — I480 Paroxysmal atrial fibrillation: Secondary | ICD-10-CM | POA: Diagnosis not present

## 2017-08-17 DIAGNOSIS — I7103 Dissection of thoracoabdominal aorta: Secondary | ICD-10-CM | POA: Diagnosis not present

## 2017-08-17 DIAGNOSIS — E871 Hypo-osmolality and hyponatremia: Secondary | ICD-10-CM

## 2017-08-17 NOTE — Progress Notes (Signed)
Patient Care Team: Colon Branch, MD as PCP - General (Internal Medicine) Deboraha Sprang, MD as Consulting Physician (Cardiology) Deneise Lever, MD as Consulting Physician (Pulmonary Disease) Renato Shin, MD as Consulting Physician (Endocrinology) Darleen Crocker, MD as Consulting Physician (Ophthalmology) Loletha Carrow Kirke Corin, MD as Consulting Physician (Gastroenterology)   HPI  Sean Riley is a 67 y.o. male Seen With Chief Complaint of  atrial fibrillation. He continues to take flecainide; he's been largely asymptomatic without symptomatic recurrences.  There is a concern that he may be allergic to diltiazem as he has a pruritic rash   Pulmonary function testing 5/15 demonstrated mild emphysema..  There has been no syncope or presyncope.    He underwent Myoview scanning 2013 which was abnormal and underwent catheterization which was normal.   9/18  Echo mild LVH EF 50-55%; bioprosthesis w normal function, mean grad 13    Date PR QRS Flecainide  5/12  112   5/15  116 100  5/16  132 100  6/17  132 100  12/17  136 100  9/18 166 144 100  1/19 180 146 100     Hospitalized 8/18 for aortic dissection w root replacement, prosthetic AVR and reconstruction of entire ascending aorta cx by pericardial effusion requiring pericardiocentesis and some R hand injury  In Cardiac rehab   Mod SOB and some chest pain.  Periodic edema, not needing prn diuretic.  BPsys at home [id number]  Thromboembolic risk factors ( age -60, HTN-1, Vasc disease -1) for a CHADSVASc Score of 3  In the past he has declined to use anticoagulation  Past Medical History:  Diagnosis Date  . Allergy   . Arthritis   . Ascending aortic dissection ([place]) 03/23/2017  . Atrial fibrillation ([place])   . Cataract    removed both eyes   . GERD (gastroesophageal reflux disease)   . HYPERLIPIDEMIA   . 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 Hypertension   . Microscopic hematuria    negative  cystoscopy  . NONSPECIFIC ABNORMAL ELECTROCARDIOGRAM   . S/P aortic dissection repair 03/24/2017   Biological Bentall aortic root replacement + resection and grafting of entire ascending aorta, transverse aortic arch and proximal descending thoracic aorta with elephant trunk distal anastomosis and debranching of aortic arch vessels  . 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 Varicose veins     Past Surgical History:  Procedure Laterality Date  . APPENDECTOMY    . CARDIAC CATHETERIZATION      X3;Dr Caryl Comes, last in July, 2013  . COLONOSCOPY    . CORONARY ANGIOPLASTY     04/24/13 : he denies angioplasty  . CYSTOSCOPY  1978   Dr Hartley Barefoot  . KNEE ARTHROSCOPY  2012   Dr Theda Sers  . LUMBAR LAMINECTOMY  1987   Dr Durward Fortes  . MYELOGRAM  2007  . PERICARDIOCENTESIS N/A 04/07/2017   Procedure: PERICARDIOCENTESIS;  Surgeon: Sherren Mocha, MD;  Location: [facility];  Service: Cardiovascular;  Laterality: N/A;  . POLYPECTOMY    . REPAIR OF ACUTE ASCENDING THORACIC AORTIC DISSECTION N/A 03/23/2017   Procedure: REPAIR OF ACUTE ASCENDING THORACIC AORTIC DISSECTION.  Bentall procedure.  Aortic root repleacement with bioprosthetic valve.  Reimplantation of left and right coronary arteries.  Total resection of transverse aortic arch.  Elephant trunk distal anastomosis and debranching of arch vessels.;  Surgeon: Rexene Alberts, MD;  Location: [facility];  Service: Vascular;  Laterality: N/A;  . ROTATOR CUFF REPAIR    . SHOULDER ARTHROSCOPY  2011   Dr Theda Sers  . SHOULDER ARTHROSCOPY Right 08/2015  . SPINAL FUSION  1986   Dr Rolin Barry  . TEE WITHOUT CARDIOVERSION N/A 03/23/2017   Procedure: TRANSESOPHAGEAL ECHOCARDIOGRAM (TEE);  Surgeon: Rexene Alberts, MD;  Location: [facility];  Service: Open Heart Surgery;   Laterality: N/A;  . TRACHEOSTOMY     age 35 for croup    Current Outpatient Medications  Medication Sig Dispense Refill  . albuterol (PROAIR HFA) 108 (90 Base) MCG/ACT inhaler Inhale two puffs every four to six hours as needed for cough or wheeze. (Patient taking differently: Inhale 2 puffs into the lungs See admin instructions. Every 4-6 hours as needed for coughing or wheezing) 3 Inhaler 0  . albuterol (PROVENTIL) (2.5 MG/3ML) 0.083% nebulizer solution Take 3 mLs (2.5 mg total) by nebulization every 8 (eight) hours as needed for wheezing or shortness of breath. 150 mL 1  . ALPRAZolam (XANAX) 1 MG tablet Take 1-1.5 tablets (1-1.5 mg total) by mouth at bedtime. 40 tablet 1  . aspirin EC 325 MG EC tablet Take 1 tablet (325 mg total) by mouth daily. 30 tablet 0  . augmented betamethasone dipropionate (DIPROLENE-AF) 0.05 % cream Apply topically 2 (two) times daily. 100 g 0  . azelastine (ASTELIN) 0.1 % nasal spray Can use two sprays in each nostril twice daily as directed. (Patient taking differently: Place 2 sprays into both nostrils 2 (two) times daily. ) 90 mL 3  . calcipotriene-betamethasone (TACLONEX) ointment Apply 1 application topically daily as needed (for eczema).     . escitalopram (LEXAPRO) 10 MG tablet Take 1 tablet (10 mg total) by mouth daily. 30 tablet 6  . esomeprazole (NEXIUM) 40 MG capsule Take 1 capsule (40 mg total) by mouth daily before breakfast. 90 capsule 3  . flecainide (TAMBOCOR) 100 MG tablet Take 1 tablet (100 mg total) by mouth 2 (two) times daily. 180 tablet 3  . fluticasone (FLONASE) 50 MCG/ACT nasal spray Use one spray in each nostril twice daily. 48 g 3  . metoprolol tartrate (LOPRESSOR) 25 MG tablet Take 1 tablet (25 mg total) by mouth 2 (two) times daily. 180 tablet 3  . rosuvastatin (CRESTOR) 5 MG tablet Take 5 mg by mouth every other day.     No current facility-administered medications for this visit.     Allergies  Allergen Reactions  . Simvastatin Other  (See Comments)    Mental status changes  . Celecoxib Other (See Comments)    GI UPSET AND INFLAMMATION  . Codeine Other (See Comments)    GI UPSET AND INFLAMMATION  . Nsaids Other (See Comments)    GI UPSET AND INFLAMMATION (can tolerate via IV)  . Tape Other (See Comments)    Medical tape and Band-Aids PULL OFF THE SKIN; please use Coban wrap  . Cephalexin Itching and Rash  . Latex Rash    Review of Systems negative except from HPI and PMH  Physical Exam BP (!) 154/83   Pulse 63   Ht 6' (1.829 m)   Wt 174 lb 8 oz (79.2 kg)   SpO2 94%   BMI 23.67 kg/m  Well developed and nourished in no acute distress HENT normal Neck supple with JVP-flat Carotids brisk and full  without bruits Clear Regular rate and rhythm, 3/6 systolic Abd-soft with active BS without hepatomegaly No Clubbing cyanosis tr edema Skin-warm and dry A & Oriented  Grossly normal sensory and motor function    ECG demonstrated sinus @ 56 14/15/49 RBBBlike IVCD  Assessment and Plan  Atrial fibrillation  Hypertension  Aortic dissection status post reconstruction    BP remains elevated here in the office and were equal bilaterally.  Would increase metoprolol but family says BP at home sometimes 80 so have asked them to record 2x/wk for the next month and then let us know    He is on statin therapy.  He is not smoking  He needs genetic testing-- if not initiated by DR CO at next visit we can arrange at cardiology followup   No symptomatic interval Afib   Not on anticoagulation  Will reach out to Dr CO re ASA dosing--not sure about \ More than 50% of 40 min was spent in counseling related to the above

## 2017-08-17 NOTE — Telephone Encounter (Signed)
Advised patient, he is due for a BMP, DX hyponatremia,  please arrange

## 2017-08-17 NOTE — Telephone Encounter (Signed)
Letter printed and mailed to Pt instructing to call office to schedule lab appt. BMP ordered.

## 2017-08-17 NOTE — Patient Instructions (Signed)
Your physician recommends that you continue on your current medications as directed. Please refer to the Current Medication list given to you today.   Your physician wants you to follow-up in:6 MONTHS WITH RENEE URSUY  You will receive a reminder letter in the mail two months in advance. If you don't receive a letter, please call our office to schedule the follow-up appointment.

## 2017-08-18 ENCOUNTER — Encounter (HOSPITAL_COMMUNITY): Payer: Medicare Other

## 2017-08-18 NOTE — Addendum Note (Signed)
Addended by: Joaquim Lai on: 08/18/2017 12:50 PM   Modules accepted: Orders

## 2017-08-20 ENCOUNTER — Encounter (HOSPITAL_COMMUNITY): Payer: Medicare Other

## 2017-08-23 ENCOUNTER — Ambulatory Visit
Admission: RE | Admit: 2017-08-23 | Discharge: 2017-08-23 | Disposition: A | Discharge status: Home / Self Care | Payer: Medicare Other | Source: Ambulatory Visit | Attending: Thoracic Surgery (Cardiothoracic Vascular Surgery) | Admitting: Thoracic Surgery (Cardiothoracic Vascular Surgery)

## 2017-08-23 ENCOUNTER — Other Ambulatory Visit: Payer: Self-pay

## 2017-08-23 ENCOUNTER — Encounter (HOSPITAL_COMMUNITY): Payer: Medicare Other

## 2017-08-23 ENCOUNTER — Other Ambulatory Visit: Payer: Self-pay | Admitting: Thoracic Surgery (Cardiothoracic Vascular Surgery)

## 2017-08-23 ENCOUNTER — Ambulatory Visit (INDEPENDENT_AMBULATORY_CARE_PROVIDER_SITE_OTHER): Payer: Medicare Other | Admitting: Thoracic Surgery (Cardiothoracic Vascular Surgery)

## 2017-08-23 ENCOUNTER — Encounter: Payer: Self-pay | Admitting: Thoracic Surgery (Cardiothoracic Vascular Surgery)

## 2017-08-23 VITALS — BP 156/82 | HR 58 | Resp 18 | Ht 72.0 in | Wt 176.0 lb

## 2017-08-23 DIAGNOSIS — I7101 Dissection of ascending aorta: Secondary | ICD-10-CM

## 2017-08-23 DIAGNOSIS — Z953 Presence of xenogenic heart valve: Secondary | ICD-10-CM | POA: Diagnosis not present

## 2017-08-23 DIAGNOSIS — I712 Thoracic aortic aneurysm, without rupture: Secondary | ICD-10-CM | POA: Diagnosis not present

## 2017-08-23 DIAGNOSIS — Z9889 Other specified postprocedural states: Secondary | ICD-10-CM | POA: Diagnosis not present

## 2017-08-23 DIAGNOSIS — I714 Abdominal aortic aneurysm, without rupture: Secondary | ICD-10-CM | POA: Diagnosis not present

## 2017-08-23 MED ORDER — IOPAMIDOL (ISOVUE-370) INJECTION 76%
100.0000 mL | Freq: Once | INTRAVENOUS | Status: AC | PRN
  Administered 2017-08-23: 100 mL via INTRAVENOUS

## 2017-08-23 MED ORDER — METOPROLOL TARTRATE 25 MG PO TABS: 50.0000 mg | ORAL_TABLET | Freq: Two times a day (BID) | ORAL | 3 refills | Status: DC

## 2017-08-23 MED ORDER — POTASSIUM CHLORIDE 20 MEQ PO PACK: 20.0000 meq | PACK | Freq: Every day | ORAL | Status: DC

## 2017-08-23 MED ORDER — FUROSEMIDE 40 MG PO TABS: 40.0000 mg | ORAL_TABLET | Freq: Every day | ORAL | 0 refills | Status: DC

## 2017-08-23 NOTE — Patient Instructions (Signed)
Increase your dose of metoprolol to 50 mg by mouth twice daily  Start taking Lasix 40 mg and potassium chloride 20 mEq every morning.    Continue to monitor your blood pressure and weight on a daily basis  You are reminded regarding the need for life long follow-up and physical restrictions related to the history of aortic dissection including the need for strict blood pressure control, periodic radiographic follow-up examinations, and avoidance of certain types of physical activity such as heavy lifting or other strenuous physical activities which tend to increase pressure within the chest or abdomen.

## 2017-08-23 NOTE — Progress Notes (Signed)
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  The patient's early postoperative recovery was notable for acute on chronic respiratory failure related to severe COPD and acute volume excess, all of which gradually improved. He developed recurrent paroxysmal atrial fibrillation and a moderate sized pericardial effusion for which he eventually underwent pericardiocentesis. Follow-up CT angiogram performed 03/31/2017 revealed intact repair of the ascending thoracic aorta and transverse aortic arch was stable dissection involving the descending thoracic and abdominal aorta. He slowly improved and eventually was discharged home 04/09/2017.    He was last seen here in our office on May 10, 2017 at which time he was doing remarkably well.  Since then he has been seen in follow-up on several occasions by his primary care physician and more recently by Dr. Caryl Comes.  He returns to our office today for routine follow-up including follow-up CT angiogram.  The patient reports that overall he is doing reasonably well but he is somewhat frustrated that his  exercise tolerance and strength has not improved more quickly.  He still gets short of breath with activity and he notes that he gets short of breath if he lays flat in bed.  He has had some mild lower extremity edema and a intermittent dry cough.  His weight has not decreased any since hospital discharge.  His blood pressure has been trending up.  He has not had any headaches or visual disturbances.  He has not had any pains in his chest or back and he really has had minimal postoperative chest wall discomfort ever since his surgery.  Appetite is reasonably good.  The remainder of his review of systems is unrevealing.   Current Outpatient Medications  Medication Sig Dispense Refill  . albuterol (PROAIR HFA) 108 (90 Base) MCG/ACT inhaler Inhale two puffs every four to six hours as needed for cough or wheeze. (Patient taking differently: Inhale 2 puffs into the lungs See admin instructions. Every 4-6 hours as needed for coughing or wheezing) 3 Inhaler 0  . albuterol (PROVENTIL) (2.5 MG/3ML) 0.083% nebulizer solution Take 3 mLs (2.5 mg total) by nebulization every 8 (eight) hours as needed for wheezing or shortness of breath. 150 mL 1  . ALPRAZolam (XANAX) 1 MG tablet Take 1-1.5 tablets (1-1.5 mg total) by mouth at bedtime. 40 tablet 1  . aspirin EC 325 MG EC tablet Take 1 tablet (325 mg total) by mouth daily. 30 tablet 0  . augmented betamethasone dipropionate (DIPROLENE-AF) 0.05 % cream Apply topically 2 (two) times daily. 100 g 0  . azelastine (ASTELIN) 0.1 % nasal spray Can use two sprays in each nostril twice daily as directed. (Patient taking  differently: Place 2 sprays into both nostrils 2 (two) times daily. ) 90 mL 3  . calcipotriene-betamethasone (TACLONEX) ointment Apply 1 application topically daily as needed (for eczema).     . escitalopram (LEXAPRO) 10 MG tablet Take 1 tablet (10 mg total) by mouth daily. 30 tablet 6  . esomeprazole (NEXIUM) 40 MG capsule Take 1 capsule (40 mg total) by mouth  daily before breakfast. 90 capsule 3  . flecainide (TAMBOCOR) 100 MG tablet Take 1 tablet (100 mg total) by mouth 2 (two) times daily. 180 tablet 3  . fluticasone (FLONASE) 50 MCG/ACT nasal spray Use one spray in each nostril twice daily. 48 g 3  . metoprolol tartrate (LOPRESSOR) 25 MG tablet Take 1 tablet (25 mg total) by mouth 2 (two) times daily. 180 tablet 3   No current facility-administered medications for this visit.       Physical Exam:   BP (!) 156/82 (BP Location: Right Arm, Patient Position: Sitting, Cuff Size: Large)   Pulse (!) 58   Resp 18   Ht 6' (1.829 m)   Wt 176 lb (79.8 kg)   SpO2 95% Comment: RA  BMI 23.87 kg/m   General:  Well-appearing  Chest:   Scattered inspiratory crackles with diminished breath sounds right lung base  CV:   Regular rate and rhythm without murmur  Incisions:  Well-healed, sternum is stable  Abdomen:  Soft nontender  Extremities:  Warm and well perfused with mild lower extremity edema  Diagnostic Tests:  CT ANGIOGRAPHY CHEST, ABDOMEN AND PELVIS  TECHNIQUE: Multidetector CT imaging through the chest, abdomen and pelvis was performed using the standard protocol during bolus administration of intravenous contrast. Multiplanar reconstructed images and MIPs were obtained and reviewed to evaluate the vascular anatomy.  Creatinine was obtained on site at [facility] at [address]  Results: Creatinine 0.9 mg/dL.  CONTRAST:  150mL ISOVUE-370 IOPAMIDOL (ISOVUE-370) INJECTION 76%  COMPARISON:  Prior CT scan of the chest 03/31/2017; most recent prior CT scan of the abdomen and pelvis 03/31/2017  FINDINGS: CTA CHEST FINDINGS  Cardiovascular: Surgical changes of aortic valve and root repair. The proximal anastomosis is widely patent. The reimplanted coronary arteries appear patent. Tube graft repair of the ascending, transverse and proximal descending thoracic aorta. All anastomoses are widely patent. The arch  vessels have been D branched and reimplanted into a single graft arising from the tube graft repair. Vessels are all widely patent. No evidence of residual dissection in the branch vessels. Mild enlargement of the aneurysmal proximal descending thoracic aorta measuring up to 4.9 cm compared to 4.6 cm previously. Persistent perfusion of the false lumen, slightly increased compared to prior.  Interval resolution of pericardial effusion. Stable mild cardiomegaly. Atherosclerotic calcifications present along the coronary arteries.  Mediastinum/Nodes: Likely reactive mediastinal lymph nodes. No suspicious lymphadenopathy. Unremarkable thyroid gland. No significant mediastinal hematoma or hemorrhage.  Lungs/Pleura: Large right and moderate left layering pleural effusions, little changed compared to prior. Associated dependent atelectasis. No overt pulmonary edema.  Musculoskeletal: No acute fracture or aggressive appearing lytic or blastic osseous lesion. Healing median sternotomy.  Review of the MIP images confirms the above findings.  CTA ABDOMEN AND PELVIS FINDINGS  VASCULAR  Aorta: The dissection flap extends throughout the abdominal aorta before terminating in the left common iliac artery. This is unchanged compared to prior. No significant aneurysmal dilatation.  Celiac: Patent without evidence of aneurysm, dissection, vasculitis or significant stenosis. Arises from the true lumen.  SMA: Arises from the true  lumen. Mild fibrofatty plaque at the origin without significant stenosis.  Renals: 2 renal arteries bilaterally. The left dominant and accessory renal arteries both arise from the true lumen. On the right, the dominant renal artery arises at the dissection flap straddling the true and false lumen. The accessory renal artery arises from the true lumen.  IMA: Arises from the true lumen.  Widely patent.  Inflow: The dissection flap extends into the left  common iliac artery. This is not flow limiting. No aneurysm or significant stenosis. Extensive atherosclerotic vascular disease bilaterally.  Veins: No focal venous abnormality.  Review of the MIP images confirms the above findings.  NON-VASCULAR  Hepatobiliary: Normal hepatic contour and morphology. Small flash fill capillary hemangioma in hepatic segment 2. Otherwise, no discrete hepatic lesions. Normal appearance of the gallbladder. No intra or extrahepatic biliary ductal dilatation.  Pancreas: Unremarkable. No pancreatic ductal dilatation or surrounding inflammatory changes.  Spleen: Normal in size without focal abnormality.  Adrenals/Urinary Tract: Adrenal glands are unremarkable. Kidneys are normal, without renal calculi, focal lesion, or hydronephrosis. Bladder is unremarkable.  Stomach/Bowel: Stomach is within normal limits. Appendix appears normal. No evidence of bowel wall thickening, distention, or inflammatory changes.  Lymphatic: No suspicious lymphadenopathy.  Reproductive: Prostate is unremarkable.  Other: No abdominal wall hernia or abnormality. No abdominopelvic ascites.  Musculoskeletal: No acute or significant osseous findings.  Review of the MIP images confirms the above findings.  IMPRESSION: CTA CHEST  1. Extensive postsurgical changes including aortic valve and root replacement, tube graft repair of the ascending, transverse and proximal descending thoracic aorta with complete deep branching of the aortic vessels. No evidence of postoperative complication. 2. Progressive aneurysmal dilatation of the dissected proximal descending thoracic aorta now measuring up to 4.9 cm compared to 4.6 cm in August of 2018. Associated increase in the prominence of the perfusing false lumen in the proximal and mid descending thoracic aorta. 3. Interval resolution of pericardial effusion consistent with interval pericardiocentesis. 4. Large  right and moderate left layering pleural effusions, similar to slightly increased compared to prior. 5. Associated bilateral dependent/compressive atelectasis.  CTA ABD/PELVIS  1. Stable dissection throughout the abdominal aorta terminating in the left common iliac artery without significant interval progression. 2. No imaging evidence of end-organ ischemia.  Signed,  Criselda Peaches, MD  [facility]  [facility]   Electronically Signed   By: Jacqulynn Cadet M.D.   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  His blood pressure remains slightly elevated in the office today and was slightly elevated when he was seen in follow-up recently by Dr. Caryl Comes.  He continues to experience exertional shortness of breath and orthopnea, and he has persistent bilateral pleural effusions, right greater than left.  I have personally reviewed the follow-up CT angiogram performed earlier today.  Overall the appearance of the patient's aorta appears unchanged in comparison with the last follow-up CT angiogram, although there has been some slight interval aneurysmal enlargement of the dissected descending thoracic aorta, up to 4.9 cm from 4.6 cm previously.  The size of the patient's pleural effusions remain unchanged.    Plan:  I have instructed the patient to increase his dose of metoprolol to 50 mg by mouth twice daily and to continue to monitor his blood pressure regularly.  I have discussed treatment options  for management of the patient's pleural effusions and we have restarted oral Lasix and potassium on a daily basis to treat his persistent chronic diastolic congestive heart failure with bilateral pleural effusions.  He will return in 4 weeks for follow-up chest x-ray and consider right thoracentesis if his right pleural effusion has not decreased in size.  We will plan repeat CT angiogram of the chest, abdomen, and pelvis in 3 months.  The patient has been reminded to avoid any heavy lifting or straining indefinitely.    I spent in excess of 15 minutes during the conduct of this office consultation and >50% of this time involved direct face-to-face encounter with the patient for counseling and/or coordination of their care.    Valentina Gu. Roxy Manns, MD 08/23/2017 3:13 PM

## 2017-08-24 ENCOUNTER — Other Ambulatory Visit: Payer: Self-pay | Admitting: *Deleted

## 2017-08-24 ENCOUNTER — Telehealth (HOSPITAL_COMMUNITY): Payer: Self-pay | Admitting: Internal Medicine

## 2017-08-24 DIAGNOSIS — I7101 Dissection of ascending aorta: Secondary | ICD-10-CM

## 2017-08-24 NOTE — Progress Notes (Signed)
Ct a 

## 2017-08-25 ENCOUNTER — Encounter (HOSPITAL_COMMUNITY): Payer: Medicare Other

## 2017-08-27 ENCOUNTER — Encounter (HOSPITAL_COMMUNITY): Payer: Medicare Other

## 2017-08-30 ENCOUNTER — Encounter (HOSPITAL_COMMUNITY): Payer: Medicare Other

## 2017-08-31 ENCOUNTER — Encounter (HOSPITAL_COMMUNITY): Payer: Self-pay | Admitting: *Deleted

## 2017-08-31 ENCOUNTER — Telehealth (HOSPITAL_COMMUNITY): Payer: Self-pay | Admitting: *Deleted

## 2017-08-31 DIAGNOSIS — Z952 Presence of prosthetic heart valve: Secondary | ICD-10-CM

## 2017-08-31 NOTE — Progress Notes (Signed)
Discharge Progress Report  Patient Details  Name: Sean Riley MRN: [id number] Date of Birth: 05/17/1951 Referring Provider:     [place] from 06/17/2017 in [place]  Referring Provider  Verdie Mosher MD       Number of Visits: 8  Reason for Discharge:  Early Exit:  medical issues  Smoking History:  Social History   Tobacco Use  Smoking Status Former Smoker  . Packs/day: 2.00  . Years: 45.00  . Pack years: 90.00  . Types: Cigarettes  . Last attempt to quit: 03/22/2017  . Years since quitting: 0.4  Smokeless Tobacco Never Used  Tobacco Comment   2 ppd , quit 03/2017    Diagnosis:  S/P AVR (aortic valve replacement) and aortoplasty  ADL UCSD:   Initial Exercise Prescription:   Discharge Exercise Prescription (Final Exercise Prescription Changes): Exercise Prescription Changes - 07/14/17 1502      Response to Exercise   Blood Pressure (Admit)  118/70    Blood Pressure (Exercise)  112/80    Blood Pressure (Exit)  120/72    Heart Rate (Admit)  66 bpm    Heart Rate (Exercise)  74 bpm    Heart Rate (Exit)  53 bpm    Symptoms  none    Duration  Continue with 30 min of aerobic exercise without signs/symptoms of physical distress.    Intensity  THRR unchanged      Progression   Progression  Continue to progress workloads to maintain intensity without signs/symptoms of physical distress.    Average METs  2.5      Resistance Training   Training Prescription  No relaxation day      Arm Ergometer   Level  1    Minutes  15    METs  2.67      Track   Laps  11    Minutes  15    METs  2.28      Home Exercise Plan   Plans to continue exercise at  Home (comment) chair exercises    Frequency  Add 2 additional days to program exercise sessions.    Initial Home Exercises Provided  07/07/17       Functional Capacity: 6 Minute Walk    Row Name 09/02/17 1453         6 Minute Walk   2 Minute Post BP   122/64        Psychological, QOL, Others - Outcomes: PHQ 2/9: Depression screen [facility] 2/9 06/21/2017 03/12/2016 03/11/2015 04/24/2013  Decreased Interest 0 0 0 0  Down, Depressed, Hopeless 0 0 0 0  PHQ - 2 Score 0 0 0 0    Quality of Life:   Personal Goals: Goals established at orientation with interventions provided to work toward goal.    Personal Goals Discharge: Goals and Risk Factor Review    Row Name 07/09/17 1533 07/09/17 1557 08/05/17 1347         Core Components/Risk Factors/Patient Goals Review   Personal Goals Review  Improve shortness of breath with ADL's;Lipids  (Pended)   Improve shortness of breath with ADL's;Lipids;Hypertension  Improve shortness of breath with ADL's;Lipids;Hypertension     Review  -  Sean Riley vital signs have been stable at cardiac rehab. Sean Riley is making slow progress  Sean Riley vital signs have been stable at cardiac rehab. Sean Riley is making slow progress, Sean Riley has not been here since 07/14/17.     Expected Outcomes  -  Sean Riley will continue to participate in phase 2 cardiac rehab to reduce the severity of his risk factors.  Sean Riley will continue to participate in phase 2 cardiac rehab to reduce the severity of his risk factors.        Exercise Goals and Review:   Nutrition & Weight - Outcomes: Pre Biometrics - 09/02/17 1452      Pre Biometrics   Weight  170 lb 11.5 oz (77.4 kg)        Nutrition:   Nutrition Discharge:   Education Questionnaire Score:   Sean Riley attended 8 exercise sessions between 06/17/17- 12/518. Sean Riley has been absent due to problems with swelling and blood pressure issues. We will discharge Sean Riley from cardiac rehab at this time. Sean Riley wife said he would like to complete his sessions at a later date and time when he is feeling better. Barnet Pall, RN,BSN 09/09/2017 11:19 AM

## 2017-09-01 ENCOUNTER — Encounter (HOSPITAL_COMMUNITY): Payer: Medicare Other

## 2017-09-02 ENCOUNTER — Telehealth: Payer: Self-pay

## 2017-09-02 DIAGNOSIS — R600 Localized edema: Secondary | ICD-10-CM

## 2017-09-02 MED ORDER — FUROSEMIDE 40 MG PO TABS: 40.0000 mg | ORAL_TABLET | Freq: Every day | ORAL | 0 refills | Status: DC

## 2017-09-02 MED ORDER — POTASSIUM CHLORIDE ER 10 MEQ PO TBCR
10.0000 meq | EXTENDED_RELEASE_TABLET | Freq: Every day | ORAL | 0 refills | Status: DC
Start: 2017-09-02 — End: 2017-09-20

## 2017-09-02 NOTE — Addendum Note (Signed)
Encounter addended by: Dorna Bloom D on: 09/02/2017 3:04 PM  Actions taken: Visit Navigator Flowsheet section accepted

## 2017-09-02 NOTE — Telephone Encounter (Signed)
Rx Refills sent to pharm and patient was notified

## 2017-09-02 NOTE — Telephone Encounter (Signed)
Needed orders are in- please call Pt and schedule lab appt at their convenience. Thank you.

## 2017-09-02 NOTE — Telephone Encounter (Signed)
-----   Message from Rexene Alberts, MD sent at 09/02/2017  2:18 PM EST ----- Regarding: RE: feet/ankles swelling Yes please refill lasix and potassium and remind them to track his weight.  ----- Message ----- From: Marylen Ponto, LPN Sent: 04/29/1006  12:58 PM To: Rexene Alberts, MD Subject: feet/ankles swelling                           Patients wife calling about feet and ankles swelling at night. His BP's are in normal range. He is currently taking lasix 40 mg daily with Potassium. His last dose will be on Monday 09/06/17. He is not having any increase with Shortness of breath.or any weight gain. Just the swelling in feet and ankles. The wife just wanted to make you aware. And if you would like him to continue the lasix? He needs a refill. Please Advise SW

## 2017-09-02 NOTE — Telephone Encounter (Signed)
Copied from [place] [id number]. Topic: Appointment Scheduling - Scheduling Inquiry for Clinic >> Sep 02, 2017 11:09 AM Arletha Grippe wrote: Reason for CRM: pt is asking for orders for sodium lab. Please call [phone] to schedule. Also. Pt would like wife to have labs scheduled at the same time. Her orders are in.

## 2017-09-03 ENCOUNTER — Encounter (HOSPITAL_COMMUNITY): Payer: Medicare Other

## 2017-09-03 NOTE — Addendum Note (Signed)
Encounter addended by: Jewel Baize, RD on: 09/03/2017 12:09 PM  Actions taken: Flowsheet data copied forward, Visit Navigator Flowsheet section accepted

## 2017-09-06 ENCOUNTER — Encounter (HOSPITAL_COMMUNITY): Payer: Medicare Other

## 2017-09-06 ENCOUNTER — Other Ambulatory Visit (INDEPENDENT_AMBULATORY_CARE_PROVIDER_SITE_OTHER): Payer: Medicare Other

## 2017-09-06 DIAGNOSIS — E871 Hypo-osmolality and hyponatremia: Secondary | ICD-10-CM | POA: Diagnosis not present

## 2017-09-06 LAB — BASIC METABOLIC PANEL
BUN: 14 mg/dL (ref 6–23)
CALCIUM: 9.3 mg/dL (ref 8.4–10.5)
CO2: 31 mEq/L (ref 19–32)
CREATININE: 0.97 mg/dL (ref 0.40–1.50)
Chloride: 93 mEq/L — ABNORMAL LOW (ref 96–112)
GFR: 81.86 mL/min (ref >60.00)
Glucose, Bld: 89 mg/dL (ref 70–99)
Potassium: 4.8 mEq/L (ref 3.5–5.1)
Sodium: 131 mEq/L — ABNORMAL LOW (ref 135–145)

## 2017-09-07 NOTE — Telephone Encounter (Signed)
Labs completed 09/06/2017.

## 2017-09-08 ENCOUNTER — Encounter (HOSPITAL_COMMUNITY): Payer: Medicare Other

## 2017-09-10 ENCOUNTER — Encounter (HOSPITAL_COMMUNITY): Payer: Medicare Other

## 2017-09-10 NOTE — Progress Notes (Addendum)
Subjective:   Sean Riley is a 67 y.o. male who presents for Medicare Annual/Subsequent preventive examination. Accompanied by wife.   Review of Systems: No ROS.  Medicare Wellness Visit. Additional risk factors are reflected in the social history.  Cardiac Risk Factors include: dyslipidemia;hypertension;advanced age (>40men, >47 women);male gender;sedentary lifestyle Sleep patterns: Takes xanax at bedtime. Normally sleeps about 6 hrs. Naps daily Home Safety/Smoke Alarms: Feels safe in home. Smoke alarms in place.  Living environment; residence and Firearm Safety: Lives with wife in 1 story home. Has medical alert necklace.   Male:   CCS-last 05/14/16: Recall 3 yrs. Polyps.      PSA-  Lab Results  Component Value Date   PSA 7.18 (H) 01/10/2016   PSA 0.53 05/10/2014   PSA 0.46 04/24/2013       Objective:    Vitals: BP 128/70 (BP Location: Left Arm, Patient Position: Sitting, Cuff Size: Normal)   Pulse 75   Ht 6' (1.829 m)   Wt 173 lb 9.6 oz (78.7 kg)   SpO2 95%   BMI 23.54 kg/m   Body mass index is 23.54 kg/m.  Advanced Directives 09/14/2017 03/29/2017 03/23/2017 04/30/2016 10/14/2013  Does Patient Have a Medical Advance Directive? No No No No Patient does not have advance directive;Patient would not like information  Would patient like information on creating a medical advance directive? No - Patient declined No - Patient declined - - -  Pre-existing out of facility DNR order (yellow form or pink MOST form) - - - - No    Tobacco Social History   Tobacco Use  Smoking Status Former Smoker  . Packs/day: 2.00  . Years: 45.00  . Pack years: 90.00  . Types: Cigarettes  . Last attempt to quit: 03/22/2017  . Years since quitting: 0.4  Smokeless Tobacco Never Used  Tobacco Comment   2 ppd , quit 03/2017     Counseling given: Not Answered Comment: 2 ppd , quit 03/2017   Clinical Intake: Pain : No/denies pain    Past Medical History:  Diagnosis Date  . Allergy     . Arthritis   . Ascending aortic dissection ([place]) 03/23/2017  . Atrial fibrillation ([place])   . Cataract    removed both eyes   . GERD (gastroesophageal reflux disease)   . HYPERLIPIDEMIA   . 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 Hypertension   . Microscopic hematuria    negative cystoscopy  . NONSPECIFIC ABNORMAL ELECTROCARDIOGRAM   . S/P aortic dissection repair 03/24/2017   Biological Bentall aortic root replacement + resection and grafting of entire ascending aorta, transverse aortic arch and proximal descending thoracic aorta with elephant trunk distal anastomosis and debranching of aortic arch vessels  . 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 Varicose veins    Past Surgical History:  Procedure Laterality Date  . APPENDECTOMY    . CARDIAC CATHETERIZATION      X3;Dr Caryl Comes, last in July, 2013  . COLONOSCOPY    . CORONARY ANGIOPLASTY     04/24/13 : he denies angioplasty  . CYSTOSCOPY  1978   Dr Hartley Barefoot  . KNEE ARTHROSCOPY  2012   Dr Theda Sers  . LUMBAR LAMINECTOMY  1987   Dr Durward Fortes  . MYELOGRAM  2007  . PERICARDIOCENTESIS  N/A 04/07/2017   Procedure: PERICARDIOCENTESIS;  Surgeon: Sherren Mocha, MD;  Location: [facility];  Service: Cardiovascular;  Laterality: N/A;  . POLYPECTOMY    . REPAIR OF ACUTE ASCENDING THORACIC AORTIC DISSECTION N/A 03/23/2017   Procedure: REPAIR OF ACUTE ASCENDING THORACIC AORTIC DISSECTION.  Bentall procedure.  Aortic root repleacement with bioprosthetic valve.  Reimplantation of left and right coronary arteries.  Total resection of transverse aortic arch.  Elephant trunk distal anastomosis and debranching of arch vessels.;  Surgeon: Rexene Alberts, MD;  Location: [facility];  Service: Vascular;  Laterality: N/A;  . ROTATOR CUFF REPAIR     . SHOULDER ARTHROSCOPY  2011   Dr Theda Sers  . SHOULDER ARTHROSCOPY Right 08/2015  . SPINAL FUSION  1986   Dr Rolin Barry  . TEE WITHOUT CARDIOVERSION N/A 03/23/2017   Procedure: TRANSESOPHAGEAL ECHOCARDIOGRAM (TEE);  Surgeon: Rexene Alberts, MD;  Location: [facility];  Service: Open Heart Surgery;  Laterality: N/A;  . TRACHEOSTOMY     age 19 for croup   Family History  Problem Relation Age of Onset  . Hypertension Mother   . Hypertension Father   . Benign prostatic hyperplasia Father        S/P TURP  . Heart attack Maternal Grandmother        MI in 38s  . Breast cancer Maternal Grandmother   . Arrhythmia Brother         X 2  . Heart attack Maternal Aunt        MI in 49s  . Stroke Neg Hx   . Diabetes Neg Hx   . Colon cancer Neg Hx   . Colon polyps Neg Hx   . Stomach cancer Neg Hx   . Rectal cancer Neg Hx   . Esophageal cancer Neg Hx   . Prostate cancer Neg Hx    Social History   Socioeconomic History  . Marital status: Married    Spouse name: None  . Number of children: 2  . Years of education: None  . Highest education level: None  Social Needs  . Financial resource strain: None  . Food insecurity - worry: None  . Food insecurity - inability: None  . Transportation needs - medical: None  . Transportation needs - non-medical: None  Occupational History  . 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 Smoking status: Former Smoker    Packs/day: 2.00    Years: 45.00    Pack years: 90.00    Types: Cigarettes    Last attempt to quit: 03/22/2017    Years since quitting: 0.4  . Smokeless tobacco: Never Used  . Tobacco comment: 2 ppd , quit 03/2017  Substance and Sexual Activity  . Alcohol use: Yes    Alcohol/week: 0.0 oz    Comment: 5 cans of beer daily  . Drug use: No  . Sexual activity: No  Other Topics Concern  . None  Social History Narrative   Lives w/ wife    Outpatient Encounter Medications as of 09/14/2017  Medication Sig  . albuterol (PROAIR HFA)  108 (90 Base) MCG/ACT inhaler Inhale two puffs every four to six hours as needed for cough or wheeze. (Patient taking differently: Inhale 2 puffs into the lungs See admin instructions. Every 4-6 hours as needed for coughing or wheezing)  . albuterol (PROVENTIL) (2.5 MG/3ML) 0.083% nebulizer solution Take 3 mLs (2.5 mg total) by nebulization every 8 (eight) hours as needed for wheezing or shortness of  breath.  . ALPRAZolam (XANAX) 1 MG tablet Take 1-1.5 tablets (1-1.5 mg total) by mouth at bedtime.  Marland Kitchen aspirin EC 325 MG EC tablet Take 1 tablet (325 mg total) by mouth daily.  Marland Kitchen augmented betamethasone dipropionate (DIPROLENE-AF) 0.05 % cream Apply topically 2 (two) times daily.  Marland Kitchen azelastine (ASTELIN) 0.1 % nasal spray Can use two sprays in each nostril twice daily as directed. (Patient taking differently: Place 2 sprays into both nostrils 2 (two) times daily. )  . calcipotriene-betamethasone (TACLONEX) ointment Apply 1 application topically daily as needed (for eczema).   . escitalopram (LEXAPRO) 10 MG tablet Take 1 tablet (10 mg total) by mouth daily.  Marland Kitchen esomeprazole (NEXIUM) 40 MG capsule Take 1 capsule (40 mg total) by mouth daily before breakfast.  . flecainide (TAMBOCOR) 100 MG tablet Take 1 tablet (100 mg total) by mouth 2 (two) times daily.  . fluticasone (FLONASE) 50 MCG/ACT nasal spray Use one spray in each nostril twice daily.  . furosemide (LASIX) 40 MG tablet Take 1 tablet (40 mg total) by mouth daily.  . metoprolol tartrate (LOPRESSOR) 25 MG tablet Take 2 tablets (50 mg total) by mouth 2 (two) times daily.  Marland Kitchen oseltamivir (TAMIFLU) 75 MG capsule Take 1 capsule (75 mg total) by mouth daily.  . potassium chloride (K-DUR) 10 MEQ tablet Take 1 tablet (10 mEq total) by mouth daily.   No facility-administered encounter medications on file as of 09/14/2017.     Activities of Daily Living In your present state of health, do you have any difficulty performing the following activities: 09/14/2017  03/27/2017  Hearing? Y N  Comment failed whisper test -  Vision? N N  Comment wears reading glasses. hx cataract sx. Dr.Beavis yearly. -  Difficulty concentrating or making decisions? N N  Walking or climbing stairs? N N  Dressing or bathing? N N  Doing errands, shopping? N N  Preparing Food and eating ? N -  Using the Toilet? N -  In the past six months, have you accidently leaked urine? Y -  Comment takes lasix -  Do you have problems with loss of bowel control? N -  Managing your Medications? N -  Managing your Finances? N -  Housekeeping or managing your Housekeeping? N -  Some recent data might be hidden    Patient Care Team: Colon Branch, MD as PCP - General (Internal Medicine) Deboraha Sprang, MD as Consulting Physician (Cardiology) Darleen Crocker, MD as Consulting Physician (Ophthalmology) Loletha Carrow, Kirke Corin, MD as Consulting Physician (Gastroenterology) Neldon Mc, Donnamarie Poag, MD as Consulting Physician (Allergy and Immunology) Rexene Alberts, MD as Consulting Physician (Cardiothoracic Surgery)   Assessment:   This is a routine wellness examination for Sean Riley. Physical assessment deferred to PCP.  Exercise Activities and Dietary recommendations Current Exercise Habits: The patient does not participate in regular exercise at present, Exercise limited by: None identified   Diet (meal preparation, eat out, water intake, caffeinated beverages, dairy products, fruits and vegetables):   24 Hour Recall Breakfast:Sausage, eggs, and grits. Dr.Pepper Lunch: Can of vegetable beef soup. Dr.Pepper Dinner:  BBQ, potatoes, slaw. Dr.Pepper    Goals    . Eat a diet lower in sodium. Decrease lower extremity swelling (pt-stated)       Fall Risk Fall Risk  09/14/2017 07/15/2017 06/17/2017 05/18/2017 03/12/2016  Falls in the past year? Yes No No No No  Comment - Emmi Telephone Survey: data to providers prior to load - - -  Number falls  in past yr: 1 - - - -  Injury with Fall? No - - - -  Risk  for fall due to : Impaired balance/gait - - - -  Follow up Education provided;Falls prevention discussed - - - -    Depression Screen PHQ 2/9 Scores 09/14/2017 06/21/2017 05/18/2017 03/12/2016  PHQ - 2 Score 0 0 - 0  Exception Documentation - - Medical reason -    Cognitive Function MMSE - Mini Mental State Exam 09/14/2017  Orientation to time 5  Orientation to Place 5  Registration 3  Attention/ Calculation 5  Recall 3  Language- name 2 objects 2  Language- repeat 1  Language- follow 3 step command 3  Language- read & follow direction 1  Write a sentence 1  Copy design 1  Total score 30        Immunization History  Administered Date(s) Administered  . Influenza, High Dose Seasonal PF 06/05/2015, 05/18/2017  . Influenza,inj,Quad PF,6+ Mos 04/24/2013, 05/10/2014  . Influenza-Unspecified 06/10/2016  . Pneumococcal Conjugate-13 05/10/2014  . Pneumococcal Polysaccharide-23 10/16/2013  . Td 07/09/2006, 02/19/2008  . Zoster 08/10/2014     Screening Tests Health Maintenance  Topic Date Due  . TETANUS/TDAP  02/18/2018  . PNA vac Low Risk Adult (2 of 2 - PPSV23) 10/17/2018  . COLONOSCOPY  05/15/2019  . INFLUENZA VACCINE  Completed  . Hepatitis C Screening  Completed       Plan:   Follow up with PCP today as scheduled.  Eat heart healthy diet (full of fruits, vegetables, whole grains, lean protein, water--limit salt, fat, and sugar intake) and increase physical activity as tolerated.  Continue doing brain stimulating activities (puzzles, reading, adult coloring books, staying active) to keep memory sharp.      I have personally reviewed and noted the following in the patient's chart:   . Medical and social history . Use of alcohol, tobacco or illicit drugs  . Current medications and supplements . Functional ability and status . Nutritional status . Physical activity . Advanced directives . List of other physicians . Hospitalizations, surgeries, and ER visits in  previous 12 months . Vitals . Screenings to include cognitive, depression, and falls . Referrals and appointments  In addition, I have reviewed and discussed with patient certain preventive protocols, quality metrics, and best practice recommendations. A written personalized care plan for preventive services as well as general preventive health recommendations were provided to patient.     Sean Riley, Sean Riley  09/14/2017  Kathlene November, MD

## 2017-09-13 ENCOUNTER — Telehealth: Payer: Self-pay | Admitting: Internal Medicine

## 2017-09-13 MED ORDER — OSELTAMIVIR PHOSPHATE 75 MG PO CAPS: 75.0000 mg | ORAL_CAPSULE | Freq: Every day | ORAL | 0 refills | Status: DC

## 2017-09-13 NOTE — Telephone Encounter (Signed)
LMOM for Pt's wife Sean Riley- informing her that if Pt is having symptoms or starts having symptoms he will need to be seen- however will send Tamiflu to [facility]. Instructed [name] to call if questions/concerns.

## 2017-09-13 NOTE — Telephone Encounter (Signed)
Copied from [place] #[id number]. Topic: Quick Communication - See Telephone Encounter >> Sep 13, 2017 11:36 AM Aurelio Brash B wrote: CRM for notification. See Telephone encounter for:    PTs son was dx with flu and his provider recommended the pt get tamiflu  [address] - [place], [place] - [place] [phone] (Phone)  [phone] (Fax)    09/13/17.

## 2017-09-13 NOTE — Telephone Encounter (Signed)
Please advise 

## 2017-09-13 NOTE — Telephone Encounter (Signed)
If he has symptoms needs to be seen otherwise send a prescription for Tamiflu 75 mg 1 tablet daily #10 no refills

## 2017-09-14 ENCOUNTER — Encounter: Payer: Self-pay | Admitting: *Deleted

## 2017-09-14 ENCOUNTER — Encounter: Payer: Self-pay | Admitting: Internal Medicine

## 2017-09-14 ENCOUNTER — Ambulatory Visit (INDEPENDENT_AMBULATORY_CARE_PROVIDER_SITE_OTHER): Payer: Medicare Other | Admitting: Internal Medicine

## 2017-09-14 ENCOUNTER — Ambulatory Visit: Payer: Medicare Other | Admitting: *Deleted

## 2017-09-14 VITALS — BP 128/70 | HR 75 | Ht 72.0 in | Wt 173.6 lb

## 2017-09-14 VITALS — BP 128/70 | HR 75 | Resp 14 | Ht 72.0 in | Wt 173.6 lb

## 2017-09-14 DIAGNOSIS — Z0000 Encounter for general adult medical examination without abnormal findings: Secondary | ICD-10-CM | POA: Diagnosis not present

## 2017-09-14 DIAGNOSIS — L409 Psoriasis, unspecified: Secondary | ICD-10-CM | POA: Insufficient documentation

## 2017-09-14 DIAGNOSIS — J441 Chronic obstructive pulmonary disease with (acute) exacerbation: Secondary | ICD-10-CM | POA: Diagnosis not present

## 2017-09-14 DIAGNOSIS — I10 Essential (primary) hypertension: Secondary | ICD-10-CM | POA: Diagnosis not present

## 2017-09-14 MED ORDER — BETAMETHASONE DIPROPIONATE AUG 0.05 % EX CREA: TOPICAL_CREAM | Freq: Two times a day (BID) | CUTANEOUS | 3 refills | Status: DC | PRN

## 2017-09-14 MED ORDER — AZITHROMYCIN 250 MG PO TABS: ORAL_TABLET | ORAL | 0 refills | Status: DC

## 2017-09-14 NOTE — Patient Instructions (Signed)
Eat heart healthy diet (full of fruits, vegetables, whole grains, lean protein, water--limit salt, fat, and sugar intake) and increase physical activity as tolerated.  Continue doing brain stimulating activities (puzzles, reading, adult coloring books, staying active) to keep memory sharp.    Sean Riley , Thank you for taking time to come for your Medicare Wellness Visit. I appreciate your ongoing commitment to your health goals. Please review the following plan we discussed and let me know if I can assist you in the future.   These are the goals we discussed: Goals    . Eat a diet lower in sodium. Decrease lower extremity swelling (pt-stated)       This is a list of the screening recommended for you and due dates:  Health Maintenance  Topic Date Due  . Tetanus Vaccine  02/18/2018  . Pneumonia vaccines (2 of 2 - PPSV23) 10/17/2018  . Colon Cancer Screening  05/15/2019  . Flu Shot  Completed  .  Hepatitis C: One time screening is recommended by Center for Disease Control  (CDC) for  adults born from [birth year] through 1966.   Completed    DASH Eating Plan DASH stands for "Dietary Approaches to Stop Hypertension." The DASH eating plan is a healthy eating plan that has been shown to reduce high blood pressure (hypertension). It may also reduce your risk for type 2 diabetes, heart disease, and stroke. The DASH eating plan may also help with weight loss. What are tips for following this plan? General guidelines  Avoid eating more than 2,300 mg (milligrams) of salt (sodium) a day. If you have hypertension, you may need to reduce your sodium intake to 1,500 mg a day.  Limit alcohol intake to no more than 1 drink a day for nonpregnant women and 2 drinks a day for men. One drink equals 12 oz of beer, 5 oz of wine, or 1 oz of hard liquor.  Work with your health care provider to maintain a healthy body weight or to lose weight. Ask what an ideal weight is for you.  Get at least 30 minutes of  exercise that causes your heart to beat faster (aerobic exercise) most days of the week. Activities may include walking, swimming, or biking.  Work with your health care provider or diet and nutrition specialist (dietitian) to adjust your eating plan to your individual calorie needs. Reading food labels  Check food labels for the amount of sodium per serving. Choose foods with less than 5 percent of the Daily Value of sodium. Generally, foods with less than 300 mg of sodium per serving fit into this eating plan.  To find whole grains, look for the word "whole" as the first word in the ingredient list. Shopping  Buy products labeled as "low-sodium" or "no salt added."  Buy fresh foods. Avoid canned foods and premade or frozen meals. Cooking  Avoid adding salt when cooking. Use salt-free seasonings or herbs instead of table salt or sea salt. Check with your health care provider or pharmacist before using salt substitutes.  Do not fry foods. Cook foods using healthy methods such as baking, boiling, grilling, and broiling instead.  Cook with heart-healthy oils, such as olive, canola, soybean, or sunflower oil. Meal planning   Eat a balanced diet that includes: ? 5 or more servings of fruits and vegetables each day. At each meal, try to fill half of your plate with fruits and vegetables. ? Up to 6-8 servings of whole grains each day. ? Less  than 6 oz of lean meat, poultry, or fish each day. A 3-oz serving of meat is about the same size as a deck of cards. One egg equals 1 oz. ? 2 servings of low-fat dairy each day. ? A serving of nuts, seeds, or beans 5 times each week. ? Heart-healthy fats. Healthy fats called Omega-3 fatty acids are found in foods such as flaxseeds and coldwater fish, like sardines, salmon, and mackerel.  Limit how much you eat of the following: ? Canned or prepackaged foods. ? Food that is high in trans fat, such as fried foods. ? Food that is high in saturated fat,  such as fatty meat. ? Sweets, desserts, sugary drinks, and other foods with added sugar. ? Full-fat dairy products.  Do not salt foods before eating.  Try to eat at least 2 vegetarian meals each week.  Eat more home-cooked food and less restaurant, buffet, and fast food.  When eating at a restaurant, ask that your food be prepared with less salt or no salt, if possible. What foods are recommended? The items listed may not be a complete list. Talk with your dietitian about what dietary choices are best for you. Grains Whole-grain or whole-wheat bread. Whole-grain or whole-wheat pasta. Brown rice. [place]. Bulgur. Whole-grain and low-sodium cereals. Pita bread. Low-fat, low-sodium crackers. Whole-wheat flour tortillas. Vegetables Fresh or frozen vegetables (raw, steamed, roasted, or grilled). Low-sodium or reduced-sodium tomato and vegetable juice. Low-sodium or reduced-sodium tomato sauce and tomato paste. Low-sodium or reduced-sodium canned vegetables. Fruits All fresh, dried, or frozen fruit. Canned fruit in natural juice (without added sugar). Meat and other protein foods Skinless chicken or [place]. Ground chicken or [place]. Pork with fat trimmed off. Fish and seafood. Egg whites. Dried beans, peas, or lentils. Unsalted nuts, nut butters, and seeds. Unsalted canned beans. Lean cuts of beef with fat trimmed off. Low-sodium, lean deli meat. Dairy Low-fat (1%) or fat-free (skim) milk. Fat-free, low-fat, or reduced-fat cheeses. Nonfat, low-sodium ricotta or cottage cheese. Low-fat or nonfat yogurt. Low-fat, low-sodium cheese. Fats and oils Soft margarine without trans fats. Vegetable oil. Low-fat, reduced-fat, or light mayonnaise and salad dressings (reduced-sodium). Canola, safflower, olive, soybean, and sunflower oils. Avocado. Seasoning and other foods Herbs. Spices. Seasoning mixes without salt. Unsalted popcorn and pretzels. Fat-free sweets. What foods are not recommended? The  items listed may not be a complete list. Talk with your dietitian about what dietary choices are best for you. Grains Baked goods made with fat, such as croissants, muffins, or some breads. Dry pasta or rice meal packs. Vegetables Creamed or fried vegetables. Vegetables in a cheese sauce. Regular canned vegetables (not low-sodium or reduced-sodium). Regular canned tomato sauce and paste (not low-sodium or reduced-sodium). Regular tomato and vegetable juice (not low-sodium or reduced-sodium). Angie Fava. Olives. Fruits Canned fruit in a light or heavy syrup. Fried fruit. Fruit in cream or butter sauce. Meat and other protein foods Fatty cuts of meat. Ribs. Fried meat. Berniece Salines. Sausage. Bologna and other processed lunch meats. Salami. Fatback. Hotdogs. Bratwurst. Salted nuts and seeds. Canned beans with added salt. Canned or smoked fish. Whole eggs or egg yolks. Chicken or [place] with skin. Dairy Whole or 2% milk, cream, and half-and-half. Whole or full-fat cream cheese. Whole-fat or sweetened yogurt. Full-fat cheese. Nondairy creamers. Whipped toppings. Processed cheese and cheese spreads. Fats and oils Butter. Stick margarine. Lard. Shortening. Ghee. Bacon fat. Tropical oils, such as coconut, palm kernel, or palm oil. Seasoning and other foods Salted popcorn and pretzels. Onion salt, garlic  salt, seasoned salt, table salt, and sea salt. Worcestershire sauce. Tartar sauce. Barbecue sauce. Teriyaki sauce. Soy sauce, including reduced-sodium. Steak sauce. Canned and packaged gravies. Fish sauce. Oyster sauce. Cocktail sauce. Horseradish that you find on the shelf. Ketchup. Mustard. Meat flavorings and tenderizers. Bouillon cubes. Hot sauce and Tabasco sauce. Premade or packaged marinades. Premade or packaged taco seasonings. Relishes. Regular salad dressings. Where to find more information:  National Heart, Lung, and [place]: https://wilson-eaton.com/  American Heart Association:  www.heart.org Summary  The DASH eating plan is a healthy eating plan that has been shown to reduce high blood pressure (hypertension). It may also reduce your risk for type 2 diabetes, heart disease, and stroke.  With the DASH eating plan, you should limit salt (sodium) intake to 2,300 mg a day. If you have hypertension, you may need to reduce your sodium intake to 1,500 mg a day.  When on the DASH eating plan, aim to eat more fresh fruits and vegetables, whole grains, lean proteins, low-fat dairy, and heart-healthy fats.  Work with your health care provider or diet and nutrition specialist (dietitian) to adjust your eating plan to your individual calorie needs. This information is not intended to replace advice given to you by your health care provider. Make sure you discuss any questions you have with your health care provider. Document Released: 07/16/2011 Document Revised: 07/20/2016 Document Reviewed: 07/20/2016 Elsevier Interactive Patient Education  2018 [employer].  Low-Sodium Eating Plan Sodium, which is an element that makes up salt, helps you maintain a healthy balance of fluids in your body. Too much sodium can increase your blood pressure and cause fluid and waste to be held in your body. Your health care provider or dietitian may recommend following this plan if you have high blood pressure (hypertension), kidney disease, liver disease, or heart failure. Eating less sodium can help lower your blood pressure, reduce swelling, and protect your heart, liver, and kidneys. What are tips for following this plan? General guidelines  Most people on this plan should limit their sodium intake to 1,500-2,000 mg (milligrams) of sodium each day. Reading food labels  The Nutrition Facts label lists the amount of sodium in one serving of the food. If you eat more than one serving, you must multiply the listed amount of sodium by the number of servings.  Choose foods with less than 140 mg  of sodium per serving.  Avoid foods with 300 mg of sodium or more per serving. Shopping  Look for lower-sodium products, often labeled as "low-sodium" or "no salt added."  Always check the sodium content even if foods are labeled as "unsalted" or "no salt added".  Buy fresh foods. ? Avoid canned foods and premade or frozen meals. ? Avoid canned, cured, or processed meats  Buy breads that have less than 80 mg of sodium per slice. Cooking  Eat more home-cooked food and less restaurant, buffet, and fast food.  Avoid adding salt when cooking. Use salt-free seasonings or herbs instead of table salt or sea salt. Check with your health care provider or pharmacist before using salt substitutes.  Cook with plant-based oils, such as canola, sunflower, or olive oil. Meal planning  When eating at a restaurant, ask that your food be prepared with less salt or no salt, if possible.  Avoid foods that contain MSG (monosodium glutamate). MSG is sometimes added to [place] food, bouillon, and some canned foods. What foods are recommended? The items listed may not be a complete list. Talk with  your dietitian about what dietary choices are best for you. Grains Low-sodium cereals, including oats, puffed wheat and rice, and shredded wheat. Low-sodium crackers. Unsalted rice. Unsalted pasta. Low-sodium bread. Whole-grain breads and whole-grain pasta. Vegetables Fresh or frozen vegetables. "No salt added" canned vegetables. "No salt added" tomato sauce and paste. Low-sodium or reduced-sodium tomato and vegetable juice. Fruits Fresh, frozen, or canned fruit. Fruit juice. Meats and other protein foods Fresh or frozen (no salt added) meat, poultry, seafood, and fish. Low-sodium canned tuna and salmon. Unsalted nuts. Dried peas, beans, and lentils without added salt. Unsalted canned beans. Eggs. Unsalted nut butters. Dairy Milk. Soy milk. Cheese that is naturally low in sodium, such as ricotta cheese, fresh  mozzarella, or Swiss cheese Low-sodium or reduced-sodium cheese. Cream cheese. Yogurt. Fats and oils Unsalted butter. Unsalted margarine with no trans fat. Vegetable oils such as canola or olive oils. Seasonings and other foods Fresh and dried herbs and spices. Salt-free seasonings. Low-sodium mustard and ketchup. Sodium-free salad dressing. Sodium-free light mayonnaise. Fresh or refrigerated horseradish. Lemon juice. Vinegar. Homemade, reduced-sodium, or low-sodium soups. Unsalted popcorn and pretzels. Low-salt or salt-free chips. What foods are not recommended? The items listed may not be a complete list. Talk with your dietitian about what dietary choices are best for you. Grains Instant hot cereals. Bread stuffing, pancake, and biscuit mixes. Croutons. Seasoned rice or pasta mixes. Noodle soup cups. Boxed or frozen macaroni and cheese. Regular salted crackers. Self-rising flour. Vegetables Sauerkraut, pickled vegetables, and relishes. Olives. [place] fries. Onion rings. Regular canned vegetables (not low-sodium or reduced-sodium). Regular canned tomato sauce and paste (not low-sodium or reduced-sodium). Regular tomato and vegetable juice (not low-sodium or reduced-sodium). Frozen vegetables in sauces. Meats and other protein foods Meat or fish that is salted, canned, smoked, spiced, or pickled. Bacon, ham, sausage, hotdogs, corned beef, chipped beef, packaged lunch meats, salt pork, jerky, pickled herring, anchovies, regular canned tuna, sardines, salted nuts. Dairy Processed cheese and cheese spreads. Cheese curds. Blue cheese. Feta cheese. String cheese. Regular cottage cheese. Buttermilk. Canned milk. Fats and oils Salted butter. Regular margarine. Ghee. Bacon fat. Seasonings and other foods Onion salt, garlic salt, seasoned salt, table salt, and sea salt. Canned and packaged gravies. Worcestershire sauce. Tartar sauce. Barbecue sauce. Teriyaki sauce. Soy sauce, including reduced-sodium.  Steak sauce. Fish sauce. Oyster sauce. Cocktail sauce. Horseradish that you find on the shelf. Regular ketchup and mustard. Meat flavorings and tenderizers. Bouillon cubes. Hot sauce and Tabasco sauce. Premade or packaged marinades. Premade or packaged taco seasonings. Relishes. Regular salad dressings. Salsa. Potato and tortilla chips. Corn chips and puffs. Salted popcorn and pretzels. Canned or dried soups. Pizza. Frozen entrees and pot pies. Summary  Eating less sodium can help lower your blood pressure, reduce swelling, and protect your heart, liver, and kidneys.  Most people on this plan should limit their sodium intake to 1,500-2,000 mg (milligrams) of sodium each day.  Canned, boxed, and frozen foods are high in sodium. Restaurant foods, fast foods, and pizza are also very high in sodium. You also get sodium by adding salt to food.  Try to cook at home, eat more fresh fruits and vegetables, and eat less fast food, canned, processed, or prepared foods. This information is not intended to replace advice given to you by your health care provider. Make sure you discuss any questions you have with your health care provider. Document Released: 01/16/2002 Document Revised: 07/20/2016 Document Reviewed: 07/20/2016 Elsevier Interactive Patient Education  [employer].

## 2017-09-14 NOTE — Assessment & Plan Note (Signed)
Cardiovascular: saw cardiothoracic surgery 08/23/2017, at the time BP was a slightly elevated, he had orthopnea, shortness of breath, bilateral pleural effusions noted.  He was prescribed Lasix and potassium, subsequent BMP satisfactory.  BP today looks good. Weight has decreased a little.  He is noted to have mild edema at the left foot but otherwise no pretibial edema.  Recommend to f/u w/ surgery as recommended. COPD exacerbation: Increased respiratory sxs for few days, likely due to bronchitis exacerbating his COPD.  Will treat with Zithromax, continue inhalers.  Mucinex.  See instructions. Flu exposure: No sxs c/w influenza, rec to proceed with Tamiflu was already prescribed yesterday HTN: See comments above, seems controlled on metoprolol, Lasix, potassium.  Last BMP satisfactory Rash, likely psoriasis: Much improved, refill Diprolene, use as needed Excoriation, left ankle: Almost looks like a pressure ulcer but they deny history of excessive pressure on that area.  Rx  local care with topical antibiotics and steroids.  Call if not better. RTC 3 months

## 2017-09-14 NOTE — Patient Instructions (Addendum)
  GO TO THE FRONT DESK Schedule your next appointment for a checkup in 3 months   Continue with Tamiflu  Start Zithromax  Mucinex DM as needed  Continue with your inhalers  Diprolene: Apply to your elbow as needed

## 2017-09-14 NOTE — Progress Notes (Signed)
Pre visit review using our clinic review tool, if applicable. No additional management support is needed unless otherwise documented below in the visit note. 

## 2017-09-14 NOTE — Progress Notes (Signed)
Subjective:    Patient ID: Sean Riley, male    DOB: 15-Jan-1951, 67 y.o.   MRN: [id number]  DOS:  09/14/2017 Type of visit - description : ROV Interval history: Here for a routine visit however his main complaint is cough. He has on and off chronic cough for a while, symptoms increased 4 days ago with more frequent cough, yellow/green sputum production. He self prescribed Augmentin, took 2 or 3 tabs ( a leftover from his wife)  He was exposed to the flu, started Tamiflu yesterday  He has some lower extremity edema, worse lately?Marland Kitchen Also has noted sore at the left ankle, for a few weeks?.  Elbow rash, much improved  Wt Readings from Last 3 Encounters:  09/14/17 173 lb 9 oz (78.7 kg)  09/14/17 173 lb 9.6 oz (78.7 kg)  08/23/17 176 lb (79.8 kg)     Review of Systems Denies fever chills No chest pain S OB-DOE slightly increased in the last couple of days. No nausea, vomiting, diarrhea.  No myalgias   Past Medical History:  Diagnosis Date  . Allergy   . Arthritis   . Ascending aortic dissection ([place]) 03/23/2017  . Atrial fibrillation ([place])   . Cataract    removed both eyes   . GERD (gastroesophageal reflux disease)   . HYPERLIPIDEMIA   . 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 Hypertension   . Microscopic hematuria    negative cystoscopy  . NONSPECIFIC ABNORMAL ELECTROCARDIOGRAM   . S/P aortic dissection repair 03/24/2017   Biological Bentall aortic root replacement + resection and grafting of entire ascending aorta, transverse aortic arch and proximal descending thoracic aorta with elephant trunk distal anastomosis and debranching of aortic arch vessels  . 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 Varicose veins     Past Surgical History:  Procedure  Laterality Date  . APPENDECTOMY    . CARDIAC CATHETERIZATION      X3;Dr Caryl Comes, last in July, 2013  . COLONOSCOPY    . CORONARY ANGIOPLASTY     04/24/13 : he denies angioplasty  . CYSTOSCOPY  1978   Dr Hartley Barefoot  . KNEE ARTHROSCOPY  2012   Dr Theda Sers  . LUMBAR LAMINECTOMY  1987   Dr Durward Fortes  . MYELOGRAM  2007  . PERICARDIOCENTESIS N/A 04/07/2017   Procedure: PERICARDIOCENTESIS;  Surgeon: Sherren Mocha, MD;  Location: [place] [facility];  Service: Cardiovascular;  Laterality: N/A;  . POLYPECTOMY    . REPAIR OF ACUTE ASCENDING THORACIC AORTIC DISSECTION N/A 03/23/2017   Procedure: REPAIR OF ACUTE ASCENDING THORACIC AORTIC DISSECTION.  Bentall procedure.  Aortic root repleacement with bioprosthetic valve.  Reimplantation of left and right coronary arteries.  Total resection of transverse aortic arch.  Elephant trunk distal anastomosis and debranching of arch vessels.;  Surgeon: Rexene Alberts, MD;  Location: [place];  Service: Vascular;  Laterality: N/A;  . ROTATOR CUFF REPAIR    . SHOULDER ARTHROSCOPY  2011   Dr Theda Sers  . SHOULDER ARTHROSCOPY Right 08/2015  . SPINAL FUSION  1986   Dr Rolin Barry  . TEE WITHOUT CARDIOVERSION N/A 03/23/2017   Procedure: TRANSESOPHAGEAL ECHOCARDIOGRAM (TEE);  Surgeon: Rexene Alberts, MD;  Location: [place];  Service: Open Heart Surgery;  Laterality: N/A;  . TRACHEOSTOMY  age 83 for croup    Social History   Socioeconomic History  . Marital status: Married    Spouse name: Not on file  . Number of children: 2  . Years of education: Not on file  . Highest education level: Not on file  Social Needs  . Financial resource strain: Not on file  . Food insecurity - worry: Not on file  . Food insecurity - inability: Not on file  . Transportation needs - medical: Not on file  . Transportation needs - non-medical: Not on file  Occupational History  . 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 Smoking status: Former Smoker    Packs/day: 2.00      Years: 45.00    Pack years: 90.00    Types: Cigarettes    Last attempt to quit: 03/22/2017    Years since quitting: 0.4  . Smokeless tobacco: Never Used  . Tobacco comment: 2 ppd , quit 03/2017  Substance and Sexual Activity  . Alcohol use: Yes    Alcohol/week: 0.0 oz    Comment: 5 cans of beer daily  . Drug use: No  . Sexual activity: No  Other Topics Concern  . Not on file  Social History Narrative   Lives w/ wife      Allergies as of 09/14/2017      Reactions   Simvastatin Other (See Comments)   Mental status changes   Celecoxib Other (See Comments)   GI UPSET AND INFLAMMATION   Codeine Other (See Comments)   GI UPSET AND INFLAMMATION   Nsaids Other (See Comments)   GI UPSET AND INFLAMMATION (can tolerate via IV)   Tape Other (See Comments)   Medical tape and Band-Aids PULL OFF THE SKIN; please use Coban wrap   Cephalexin Itching, Rash   Latex Rash      Medication List        Accurate as of 09/14/17  8:26 PM. Always use your most recent med list.          albuterol 108 (90 Base) MCG/ACT inhaler Commonly known as:  PROAIR HFA Inhale two puffs every four to six hours as needed for cough or wheeze.   albuterol (2.5 MG/3ML) 0.083% nebulizer solution Commonly known as:  PROVENTIL Take 3 mLs (2.5 mg total) by nebulization every 8 (eight) hours as needed for wheezing or shortness of breath.   ALPRAZolam 1 MG tablet Commonly known as:  XANAX Take 1-1.5 tablets (1-1.5 mg total) by mouth at bedtime.   aspirin 325 MG EC tablet Take 1 tablet (325 mg total) by mouth daily.   augmented betamethasone dipropionate 0.05 % cream Commonly known as:  DIPROLENE-AF Apply topically 2 (two) times daily as needed.   azelastine 0.1 % nasal spray Commonly known as:  ASTELIN Can use two sprays in each nostril twice daily as directed.   azithromycin 250 MG tablet Commonly known as:  ZITHROMAX Z-PAK 2 tabs a day the first day, then 1 tab a day x 4 days   escitalopram 10 MG  tablet Commonly known as:  LEXAPRO Take 1 tablet (10 mg total) by mouth daily.   esomeprazole 40 MG capsule Commonly known as:  NEXIUM Take 1 capsule (40 mg total) by mouth daily before breakfast.   flecainide 100 MG tablet Commonly known as:  TAMBOCOR Take 1 tablet (100 mg total) by mouth 2 (two) times daily.   fluticasone 50 MCG/ACT nasal spray Commonly known as:  FLONASE Use one  spray in each nostril twice daily.   furosemide 40 MG tablet Commonly known as:  LASIX Take 1 tablet (40 mg total) by mouth daily.   metoprolol tartrate 25 MG tablet Commonly known as:  LOPRESSOR Take 2 tablets (50 mg total) by mouth 2 (two) times daily.   oseltamivir 75 MG capsule Commonly known as:  TAMIFLU Take 1 capsule (75 mg total) by mouth daily.   potassium chloride 10 MEQ tablet Commonly known as:  K-DUR Take 1 tablet (10 mEq total) by mouth daily.          Objective:   Physical Exam BP 128/70 (BP Location: Left Arm, Patient Position: Sitting, Cuff Size: Normal)   Pulse 75   Resp 14   Ht 6' (1.829 m)   Wt 173 lb 9 oz (78.7 kg)   SpO2 95%   BMI 23.54 kg/m   General:   Well developed, well nourished . NAD.  HEENT:  Normocephalic . Face symmetric, atraumatic.  TMs normal, throat symmetric no red, nose is slightly congested. Lungs:  Few rhonchi, no increased work of breathing. Normal respiratory effort, no intercostal retractions, no accessory muscle use. Heart: RRR,  no murmur.  Calves symmetric, no pretibial edema, left feet dorsum with minimal edema if any. Skin: Not pale. Not jaundice Neurologic:  alert & oriented X3.  Speech normal, gait appropriate for age and unassisted Psych--  Cognition and judgment appear intact.  Cooperative with normal attention span and concentration.  Behavior appropriate. No anxious or depressed appearing.   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 Tried a cpap. [place] (-) 07-2015 CV: --P- Atrial fibrillation Dr Caryl Comes, on NSR, no anticoag --lexiscan 2013 --Cardiac catheterization July 2013: Normal LV FX,, mild nonobstructive CAD Dr. Angelena Form --thoracis Ao  dissection , acute, surgery 03-2017 GU: BPH, microscopic hematuria (negative cystoscopy 1978) Elevated PSA: saw urology  04-2016 Gynecomastia, Dr. Loanne Drilling  >> Bx (-) 10-2014 ; took tamoxifen temporarily (self d/c 06-2016), improved Hypogonadism-- dx per Dr Loanne Drilling, numbers improved d/t nolvadex  Sees dermatology : eczema? rx taclonex H/o  Tracheostomy , age 49, croup  PLAN: Cardiovascular: saw cardiothoracic surgery 08/23/2017, at the time BP was a slightly elevated, he had orthopnea, shortness of breath, bilateral pleural effusions noted.  He was prescribed Lasix and potassium, subsequent BMP satisfactory.  BP today looks good. Weight has decreased a little.  He is noted to have mild edema at the left foot but otherwise no pretibial edema.  Recommend to f/u w/ surgery as recommended. COPD exacerbation: Increased respiratory sxs for few days, likely due to bronchitis exacerbating his COPD.  Will treat with Zithromax, continue inhalers.  Mucinex.  See instructions. Flu exposure: No sxs c/w influenza, rec to proceed with Tamiflu was already prescribed yesterday HTN: See comments above, seems controlled on metoprolol, Lasix, potassium.  Last BMP satisfactory Rash, likely psoriasis: Much improved, refill Diprolene, use as needed Excoriation, left ankle: Almost looks like a pressure ulcer but they deny history of excessive pressure on that area.  Rx  local care with topical antibiotics and steroids.  Call if not better. RTC 3 months.

## 2017-09-20 ENCOUNTER — Ambulatory Visit
Admission: RE | Admit: 2017-09-20 | Discharge: 2017-09-20 | Disposition: A | Discharge status: Home / Self Care | Payer: Medicare Other | Source: Ambulatory Visit | Attending: Thoracic Surgery (Cardiothoracic Vascular Surgery) | Admitting: Thoracic Surgery (Cardiothoracic Vascular Surgery)

## 2017-09-20 ENCOUNTER — Ambulatory Visit (INDEPENDENT_AMBULATORY_CARE_PROVIDER_SITE_OTHER): Payer: Medicare Other | Admitting: Thoracic Surgery (Cardiothoracic Vascular Surgery)

## 2017-09-20 ENCOUNTER — Encounter: Payer: Self-pay | Admitting: Thoracic Surgery (Cardiothoracic Vascular Surgery)

## 2017-09-20 ENCOUNTER — Other Ambulatory Visit: Payer: Self-pay | Admitting: *Deleted

## 2017-09-20 VITALS — BP 127/70 | HR 47 | Temp 97.7°F | Resp 20 | Ht 72.0 in | Wt 175.0 lb

## 2017-09-20 DIAGNOSIS — R600 Localized edema: Secondary | ICD-10-CM

## 2017-09-20 DIAGNOSIS — I7101 Dissection of ascending aorta: Secondary | ICD-10-CM

## 2017-09-20 DIAGNOSIS — Z953 Presence of xenogenic heart valve: Secondary | ICD-10-CM | POA: Diagnosis not present

## 2017-09-20 DIAGNOSIS — Z9889 Other specified postprocedural states: Secondary | ICD-10-CM

## 2017-09-20 DIAGNOSIS — J90 Pleural effusion, not elsewhere classified: Secondary | ICD-10-CM | POA: Diagnosis not present

## 2017-09-20 MED ORDER — POTASSIUM CHLORIDE ER 10 MEQ PO TBCR: 10.0000 meq | EXTENDED_RELEASE_TABLET | Freq: Every day | ORAL | 0 refills | Status: DC

## 2017-09-20 MED ORDER — FUROSEMIDE 40 MG PO TABS: 40.0000 mg | ORAL_TABLET | Freq: Every day | ORAL | 0 refills | Status: DC

## 2017-09-20 NOTE — Patient Instructions (Signed)
Continue all previous medications without any changes at this time  

## 2017-09-20 NOTE — Progress Notes (Signed)
[address]       [place] [address]             [phone]     CARDIOTHORACIC SURGERY OFFICE NOTE  Referring Provider is Lacretia Leigh, MD  Primary Cardiologist is Deboraha Sprang, MD PCP is Colon Branch, MD   HPI:  Patient returns the office today for follow-up of acute exacerbation of chronic diastolic congestive heart failure with bilateral pleural effusions.  He was seen in our office on August 23, 2017 for routine follow-up status post emergency repair of acute type A aortic dissection using deep hypothermia and total circulatory arrest for resection and grafting of the entire ascending thoracic aorta, transverse aortic arch, and proximal descending thoracic aorta with elephant trunk distal anastomosis, debranching of the arch vessels and 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  At that time follow-up CT angiogram revealed moderate size right and small left pleural effusion and clinically the patient looked fluid overloaded on exam.  His dose of metoprolol was increased to address hypertension and he was restarted on Lasix and potassium.  He returns to our office today for follow-up.  He reports that overall he is doing well.  He denies shortness of breath.  Lower extremity edema has improved considerably.  Overall he feels well and has no complaints.  Current Outpatient Medications  Medication Sig Dispense Refill  . albuterol (PROAIR HFA) 108 (90 Base) MCG/ACT inhaler Inhale two puffs every four to six hours as needed for cough or wheeze. (Patient taking differently: Inhale 2 puffs into the lungs See admin instructions. Every 4-6 hours as needed for coughing or wheezing) 3 Inhaler 0  . albuterol (PROVENTIL) (2.5 MG/3ML) 0.083% nebulizer solution Take 3 mLs (2.5 mg total) by nebulization every 8 (eight) hours as needed for wheezing or shortness of breath. 150 mL  1  . ALPRAZolam (XANAX) 1 MG tablet Take 1-1.5 tablets (1-1.5 mg total) by mouth at bedtime. 40 tablet 1  . aspirin EC 325 MG EC tablet Take 1 tablet (325 mg total) by mouth daily. 30 tablet 0  . augmented betamethasone dipropionate (DIPROLENE-AF) 0.05 % cream Apply topically 2 (two) times daily as needed. 100 g 3  . azelastine (ASTELIN) 0.1 % nasal spray Can use two sprays in each nostril twice daily as directed. (Patient taking differently: Place 2 sprays into both nostrils 2 (two) times daily. ) 90 mL 3  . escitalopram (LEXAPRO) 10 MG tablet Take 1 tablet (10 mg total) by mouth daily. 30 tablet 6  . esomeprazole (NEXIUM) 40 MG capsule Take 1 capsule (40 mg total) by mouth daily before breakfast. 90 capsule 3  . flecainide (TAMBOCOR) 100 MG tablet Take 1 tablet (100 mg total) by mouth 2 (two) times daily. 180 tablet 3  . fluticasone (FLONASE) 50 MCG/ACT nasal spray Use one spray in each nostril twice daily. 48 g 3  . furosemide (LASIX) 40 MG tablet Take 1 tablet (40 mg total) by mouth daily. 14 tablet 0  . metoprolol tartrate (LOPRESSOR) 25 MG tablet Take 2 tablets (50 mg total) by mouth 2 (two) times daily. 180 tablet 3  . oseltamivir (TAMIFLU) 75 MG capsule Take 1 capsule (75 mg total) by mouth daily. 10 capsule 0  . potassium chloride (K-DUR) 10 MEQ tablet Take 1 tablet (10 mEq total) by mouth daily. 14 tablet 0   No current facility-administered medications  for this visit.       Physical Exam:   BP 127/70   Pulse (!) 47   Temp 97.7 F (36.5 C) (Oral)   Resp 20   Ht 6' (1.829 m)   Wt 175 lb (79.4 kg)   SpO2 95% Comment: RA  BMI 23.73 kg/m   General:  Well-appearing  Chest:   Clear to auscultation with symmetrical breath sounds  CV:   Regular rate and rhythm without murmur  Incisions:  Completely healed  Abdomen:  Soft nontender  Extremities:  Warm and well perfused with very mild bilateral lower extremity edema at the ankle  Diagnostic Tests:  CHEST  2 VIEW  COMPARISON:   PA and lateral chest 05/10/2017 and 04/16/2017. CT chest 03/23/2017.  FINDINGS: Small left pleural effusion is decreased in size since the most recent examination. Small right pleural effusion is increased. There is associated mild bibasilar atelectasis. 8 intact median sternotomy wires and aortic valve replacement are unchanged in appearance. No pneumothorax, consolidative process or edema. COPD noted.  IMPRESSION: Decreased small left pleural effusion with slight increase in a small right pleural effusion. No acute disease.  COPD.   Electronically Signed   By: Inge Rise M.D.   On: 09/20/2017 13:11    Impression:  Patient's acute exacerbation of chronic diastolic congestive heart failure has improved considerably with reinitiation of diuretic therapy.  Patient's blood pressure is under better control.  Plan:  We have not recommended any change the patient's current medications at this time.  We have given the patient 1 month refill prescription for both Lasix and potassium.  I suspect that he may need these chronically.  Patient will return to our office in approximately 3 months for follow-up CT angiogram.  All questions answered.  I spent in excess of 15 minutes during the conduct of this office consultation and >50% of this time involved direct face-to-face encounter with the patient for counseling and/or coordination of their care.    Valentina Gu. Roxy Manns, MD 09/20/2017 1:41 PM

## 2017-10-04 ENCOUNTER — Other Ambulatory Visit: Payer: Medicare Other

## 2017-10-04 ENCOUNTER — Ambulatory Visit: Payer: Medicare Other | Admitting: Thoracic Surgery (Cardiothoracic Vascular Surgery)

## 2017-10-05 ENCOUNTER — Ambulatory Visit (INDEPENDENT_AMBULATORY_CARE_PROVIDER_SITE_OTHER): Payer: Medicare Other | Admitting: Internal Medicine

## 2017-10-05 ENCOUNTER — Telehealth: Payer: Self-pay

## 2017-10-05 ENCOUNTER — Encounter: Payer: Self-pay | Admitting: Internal Medicine

## 2017-10-05 VITALS — BP 126/62 | HR 51 | Temp 97.5°F | Resp 14 | Ht 72.0 in | Wt 176.4 lb

## 2017-10-05 DIAGNOSIS — J449 Chronic obstructive pulmonary disease, unspecified: Secondary | ICD-10-CM | POA: Diagnosis not present

## 2017-10-05 MED ORDER — PREDNISONE 10 MG PO TABS: ORAL_TABLET | ORAL | 0 refills | Status: DC

## 2017-10-05 MED ORDER — BUDESONIDE-FORMOTEROL FUMARATE 160-4.5 MCG/ACT IN AERO: 2.0000 | INHALATION_SPRAY | Freq: Two times a day (BID) | RESPIRATORY_TRACT | 3 refills | Status: DC

## 2017-10-05 NOTE — Telephone Encounter (Signed)
PA initiated via Covermymeds; KEY: [id number]. Received real-time PA approval.  CaseId:[id number];Status:Approved;Review Type:Prior Auth;Coverage Start Date:09/05/2017;Coverage End Date:10/04/2020;

## 2017-10-05 NOTE — Patient Instructions (Signed)
Take prednisone as prescribed for few days  Symbicort 2 puffs twice a day  Okay to take Mucinex DM as needed for cough  Continue with nasal sprays  Okay to take albuterol as rescue inhaler  Please call me in 2 weeks and let me know how you are doing

## 2017-10-05 NOTE — Progress Notes (Signed)
Pre visit review using our clinic review tool, if applicable. No additional management support is needed unless otherwise documented below in the visit note. 

## 2017-10-05 NOTE — Progress Notes (Signed)
Subjective:    Patient ID: Sean Riley, male    DOB: 01/12/1951, 67 y.o.   MRN: [id number]  DOS:  10/05/2017 Type of visit - description : Acute Interval history: Since the last visit, was diagnosed with a COPD exacerbation and was exposed to influenza, S/p Zithromax and Tamiflu. Overall better but he continue with cough and wheezing on and off.    Review of Systems Denies fever chills Some postnasal dripping No nausea, vomiting, diarrhea.  Past Medical History:  Diagnosis Date  . Allergy   . Arthritis   . Ascending aortic dissection ([place]) 03/23/2017  . Atrial fibrillation ([place])   . Cataract    removed both eyes   . GERD (gastroesophageal reflux disease)   . HYPERLIPIDEMIA   . 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 Hypertension   . Microscopic hematuria    negative cystoscopy  . NONSPECIFIC ABNORMAL ELECTROCARDIOGRAM   . Pleural effusion on right   . Pleural effusion, bilateral   . S/P aortic dissection repair 03/24/2017   Biological Bentall aortic root replacement + resection and grafting of entire ascending aorta, transverse aortic arch and proximal descending thoracic aorta with elephant trunk distal anastomosis and debranching of aortic arch vessels  . 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 Ease bovine pericardial tissue valve and 28 mm Gelweave Valsalva aortic root graft with reimplantation of left main and right coronary arteries  . Varicose veins     Past Surgical History:  Procedure Laterality Date  . APPENDECTOMY    . CARDIAC CATHETERIZATION      X3;Dr Caryl Comes, last in July, 2013  . COLONOSCOPY    . CORONARY ANGIOPLASTY     04/24/13 : he denies angioplasty  . CYSTOSCOPY  1978   Dr Hartley Barefoot  . KNEE ARTHROSCOPY  2012   Dr Theda Sers  . LUMBAR LAMINECTOMY  1987   Dr Durward Fortes  . MYELOGRAM  2007  . PERICARDIOCENTESIS N/A 04/07/2017   Procedure: PERICARDIOCENTESIS;  Surgeon: Sherren Mocha,  MD;  Location: [facility];  Service: Cardiovascular;  Laterality: N/A;  . POLYPECTOMY    . REPAIR OF ACUTE ASCENDING THORACIC AORTIC DISSECTION N/A 03/23/2017   Procedure: REPAIR OF ACUTE ASCENDING THORACIC AORTIC DISSECTION.  Bentall procedure.  Aortic root repleacement with bioprosthetic valve.  Reimplantation of left and right coronary arteries.  Total resection of transverse aortic arch.  Elephant trunk distal anastomosis and debranching of arch vessels.;  Surgeon: Rexene Alberts, MD;  Location: [place];  Service: Vascular;  Laterality: N/A;  . ROTATOR CUFF REPAIR    . SHOULDER ARTHROSCOPY  2011   Dr Theda Sers  . SHOULDER ARTHROSCOPY Right 08/2015  . SPINAL FUSION  1986   Dr Rolin Barry  . TEE WITHOUT CARDIOVERSION N/A 03/23/2017   Procedure: TRANSESOPHAGEAL ECHOCARDIOGRAM (TEE);  Surgeon: Rexene Alberts, MD;  Location: [place];  Service: Open Heart Surgery;  Laterality: N/A;  . TRACHEOSTOMY     age 31 for croup    Social History   Socioeconomic History  . Marital status: Married    Spouse name: Not on file  . Number of children: 2  . Years of education: Not on file  . Highest education level: Not on file  Social Needs  . Financial resource strain: Not on file  . Food insecurity - worry: Not on file  . Food insecurity - inability: Not on file  . Transportation needs - medical:  Not on file  . Transportation needs - non-medical: Not on file  Occupational History  . 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 Smoking status: Former Smoker    Packs/day: 2.00    Years: 45.00    Pack years: 90.00    Types: Cigarettes    Last attempt to quit: 03/22/2017    Years since quitting: 0.5  . Smokeless tobacco: Never Used  . Tobacco comment: 2 ppd , quit 03/2017  Substance and Sexual Activity  . Alcohol use: Yes    Alcohol/week: 0.0 oz    Comment: 5 cans of beer daily  . Drug use: No  . Sexual activity: No  Other Topics Concern  . Not on file  Social History Narrative    Lives w/ wife      Allergies as of 10/05/2017      Reactions   Simvastatin Other (See Comments)   Mental status changes   Celecoxib Other (See Comments)   GI UPSET AND INFLAMMATION   Codeine Other (See Comments)   GI UPSET AND INFLAMMATION   Nsaids Other (See Comments)   GI UPSET AND INFLAMMATION (can tolerate via IV)   Tape Other (See Comments)   Medical tape and Band-Aids PULL OFF THE SKIN; please use Coban wrap   Cephalexin Itching, Rash   Latex Rash      Medication List        Accurate as of 10/05/17 11:59 PM. Always use your most recent med list.          albuterol 108 (90 Base) MCG/ACT inhaler Commonly known as:  PROAIR HFA Inhale two puffs every four to six hours as needed for cough or wheeze.   albuterol (2.5 MG/3ML) 0.083% nebulizer solution Commonly known as:  PROVENTIL Take 3 mLs (2.5 mg total) by nebulization every 8 (eight) hours as needed for wheezing or shortness of breath.   ALPRAZolam 1 MG tablet Commonly known as:  XANAX Take 1-1.5 tablets (1-1.5 mg total) by mouth at bedtime.   aspirin 325 MG EC tablet Take 1 tablet (325 mg total) by mouth daily.   augmented betamethasone dipropionate 0.05 % cream Commonly known as:  DIPROLENE-AF Apply topically 2 (two) times daily as needed.   azelastine 0.1 % nasal spray Commonly known as:  ASTELIN Can use two sprays in each nostril twice daily as directed.   budesonide-formoterol 160-4.5 MCG/ACT inhaler Commonly known as:  SYMBICORT Inhale 2 puffs into the lungs 2 (two) times daily.   escitalopram 10 MG tablet Commonly known as:  LEXAPRO Take 1 tablet (10 mg total) by mouth daily.   esomeprazole 40 MG capsule Commonly known as:  NEXIUM Take 1 capsule (40 mg total) by mouth daily before breakfast.   flecainide 100 MG tablet Commonly known as:  TAMBOCOR Take 1 tablet (100 mg total) by mouth 2 (two) times daily.   fluticasone 50 MCG/ACT nasal spray Commonly known as:  FLONASE Use one spray in each  nostril twice daily.   furosemide 40 MG tablet Commonly known as:  LASIX Take 1 tablet (40 mg total) by mouth daily.   metoprolol tartrate 25 MG tablet Commonly known as:  LOPRESSOR Take 2 tablets (50 mg total) by mouth 2 (two) times daily.   potassium chloride 10 MEQ tablet Commonly known as:  K-DUR Take 1 tablet (10 mEq total) by mouth daily.   predniSONE 10 MG tablet Commonly known as:  DELTASONE 4 tablets x 2 days, 3 tabs x 2  days, 2 tabs x 2 days, 1 tab x 2 days          Objective:   Physical Exam BP 126/62 (BP Location: Left Arm, Patient Position: Sitting, Cuff Size: Small)   Pulse (!) 51   Temp (!) 97.5 F (36.4 C) (Oral)   Resp 14   Ht 6' (1.829 m)   Wt 176 lb 6 oz (80 kg)   SpO2 96%   BMI 23.92 kg/m  General:   Well developed, well nourished . NAD.  HEENT:  Normocephalic . Face symmetric, atraumatic Lungs:  Very few rhonchi, slightly increased expiratory time Normal respiratory effort, no intercostal retractions, no accessory muscle use. Heart: RRR,  no murmur.  No pretibial edema bilaterally  Skin: Not pale. Not jaundice Neurologic:  alert & oriented X3.  Speech normal, gait appropriate for age and unassisted Psych--  Cognition and judgment appear intact.  Cooperative with normal attention span and concentration.  Behavior appropriate. No anxious or depressed appearing.      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 Tried a cpap. [place] (-) 07-2015 CV: --P- Atrial fibrillation Dr Caryl Comes, on NSR, no anticoag --lexiscan 2013 --Cardiac catheterization July 2013: Normal LV FX,, mild nonobstructive CAD Dr. Angelena Form --thoracis Ao  dissection , acute, surgery 03-2017 GU: BPH, microscopic hematuria (negative cystoscopy 1978) Elevated PSA: saw urology   04-2016 Gynecomastia, Dr. Loanne Drilling  >> Bx (-) 10-2014 ; took tamoxifen temporarily (self d/c 06-2016), improved Hypogonadism-- dx per Dr Loanne Drilling, numbers improved d/t nolvadex  Sees dermatology : eczema? rx taclonex H/o  Tracheostomy , age 4, croup  PLAN: COPD w/ persistent cough and wheezing.  Exam is benign except for a few rhonchi and increased expiratory time, last chest x-ray okay (09/20/2017).  Plan: Continue with albuterol prn, add Symbicort.  Asked patient to call in 2 weeks and let me know how he is doing.  If not better consider pulmonary referral.

## 2017-10-06 DIAGNOSIS — J449 Chronic obstructive pulmonary disease, unspecified: Secondary | ICD-10-CM | POA: Insufficient documentation

## 2017-10-06 NOTE — Assessment & Plan Note (Signed)
COPD w/ persistent cough and wheezing.  Exam is benign except for a few rhonchi and increased expiratory time, last chest x-ray okay (09/20/2017).  Plan: Continue with albuterol prn, add Symbicort.  Asked patient to call in 2 weeks and let me know how he is doing.  If not better consider pulmonary referral.

## 2017-10-18 ENCOUNTER — Telehealth: Payer: Self-pay | Admitting: Internal Medicine

## 2017-10-18 DIAGNOSIS — J449 Chronic obstructive pulmonary disease, unspecified: Secondary | ICD-10-CM

## 2017-10-18 DIAGNOSIS — R05 Cough: Secondary | ICD-10-CM

## 2017-10-18 DIAGNOSIS — R059 Cough, unspecified: Secondary | ICD-10-CM

## 2017-10-18 MED ORDER — DOXYCYCLINE HYCLATE 100 MG PO TABS: 100.0000 mg | ORAL_TABLET | Freq: Two times a day (BID) | ORAL | 0 refills | Status: DC

## 2017-10-18 NOTE — Telephone Encounter (Signed)
Send doxycycline 100 mg B.I.D. #14 no refills Arrange a pulmonary referral- DX COPD Continue Symbicort

## 2017-10-18 NOTE — Telephone Encounter (Signed)
Spoke w/ [name]- informed that Rx has been sent and informed of pulmonary referral. [name] verbalized understanding.

## 2017-10-18 NOTE — Telephone Encounter (Signed)
Copied from [place]. Topic: Quick Communication - See Telephone Encounter >> Oct 18, 2017 10:56 AM Ahmed Prima L wrote: CRM for notification. See Telephone encounter for:   10/18/17.  [name] (wife) said that the patient is not any better after being seen about 2 weeks ago with the coughing. They were advised to call back if so.  Please call back @ [phone]

## 2017-10-18 NOTE — Telephone Encounter (Signed)
Please advise 

## 2017-10-27 ENCOUNTER — Telehealth: Payer: Self-pay

## 2017-10-27 MED ORDER — ALPRAZOLAM 1 MG PO TABS: 1.0000 mg | ORAL_TABLET | Freq: Every day | ORAL | 3 refills | Status: DC

## 2017-10-27 NOTE — Telephone Encounter (Signed)
Pt is requesting refill on alprazolam.   Last OV: 10/05/2017 Last Fill: 08/11/2017 #40 and 1RF UDS: 05/18/2017 Low risk  NCCR printed no issues noted  Please advise.

## 2017-10-27 NOTE — Telephone Encounter (Signed)
sent 

## 2017-11-05 DIAGNOSIS — J189 Pneumonia, unspecified organism: Secondary | ICD-10-CM | POA: Diagnosis not present

## 2017-11-17 ENCOUNTER — Encounter: Payer: Self-pay | Admitting: Pulmonary Disease

## 2017-11-17 ENCOUNTER — Ambulatory Visit (INDEPENDENT_AMBULATORY_CARE_PROVIDER_SITE_OTHER): Payer: Medicare Other | Admitting: Pulmonary Disease

## 2017-11-17 VITALS — BP 140/62 | HR 58 | Ht 72.0 in | Wt 180.5 lb

## 2017-11-17 DIAGNOSIS — J439 Emphysema, unspecified: Secondary | ICD-10-CM | POA: Diagnosis not present

## 2017-11-17 MED ORDER — UMECLIDINIUM BROMIDE 62.5 MCG/INH IN AEPB: 1.0000 | INHALATION_SPRAY | Freq: Every day | RESPIRATORY_TRACT | 3 refills | Status: AC

## 2017-11-17 MED ORDER — FLUTICASONE FUROATE-VILANTEROL 200-25 MCG/INH IN AEPB: 1.0000 | INHALATION_SPRAY | Freq: Every day | RESPIRATORY_TRACT | 3 refills | Status: DC

## 2017-11-17 NOTE — Progress Notes (Signed)
Sean Riley    [id number]    [birth year]  Primary Care Physician:Paz, Alda Berthold, MD  Referring Physician: Colon Branch, MD [address] [place], [place] [address]  Chief complaint: Consult for COPD  HPI: 67 year old heavy smoker with history of ascending aortic dissection status post repair, aortic wall replacement on 03/24/17 Followed with Dr. Roxy Manns.  CT scan in January showed large bilateral effusion.  He has been placed on Lasix with improvement in effusion States that his breathing continues to be poor post surgery with no improvement with diuresis Had a COPD exacerbation earlier this year, was exposed to the flu from his son treated with Zithromax and Tamiflu Given Symbicort which is not using.  He is using breo which is his wife's inhaler and incruse, nebulizers which help with her breathing.  Chief complaint is dyspnea with activity, productive cough.  No wheezing, hemoptysis.  Pets: Dog, no birds, [place]. Occupation: Retired Museum/gallery curator. Exposures: No known exposures, no mold, he has a hot tub but does not use it Smoking history: 90-pack-year smoking history.  Quit in 2018 Travel History: Not significant  Outpatient Encounter Medications as of 11/17/2017  Medication Sig  . albuterol (PROAIR HFA) 108 (90 Base) MCG/ACT inhaler Inhale two puffs every four to six hours as needed for cough or wheeze. (Patient taking differently: Inhale 2 puffs into the lungs See admin instructions. Every 4-6 hours as needed for coughing or wheezing)  . albuterol (PROVENTIL) (2.5 MG/3ML) 0.083% nebulizer solution Take 3 mLs (2.5 mg total) by nebulization every 8 (eight) hours as needed for wheezing or shortness of breath.  . ALPRAZolam (XANAX) 1 MG tablet Take 1-1.5 tablets (1-1.5 mg total) by mouth at bedtime.  Marland Kitchen aspirin EC 325 MG EC tablet Take 1 tablet (325 mg total) by mouth daily.  Marland Kitchen augmented betamethasone dipropionate (DIPROLENE-AF) 0.05 %  cream Apply topically 2 (two) times daily as needed.  Marland Kitchen azelastine (ASTELIN) 0.1 % nasal spray Can use two sprays in each nostril twice daily as directed. (Patient taking differently: Place 2 sprays into both nostrils 2 (two) times daily. )  . budesonide-formoterol (SYMBICORT) 160-4.5 MCG/ACT inhaler Inhale 2 puffs into the lungs 2 (two) times daily.  Marland Kitchen escitalopram (LEXAPRO) 10 MG tablet Take 1 tablet (10 mg total) by mouth daily.  Marland Kitchen esomeprazole (NEXIUM) 40 MG capsule Take 1 capsule (40 mg total) by mouth daily before breakfast.  . flecainide (TAMBOCOR) 100 MG tablet Take 1 tablet (100 mg total) by mouth 2 (two) times daily.  . fluticasone (FLONASE) 50 MCG/ACT nasal spray Use one spray in each nostril twice daily.  . metoprolol tartrate (LOPRESSOR) 25 MG tablet Take 2 tablets (50 mg total) by mouth 2 (two) times daily.  Marland Kitchen doxycycline (VIBRA-TABS) 100 MG tablet Take 1 tablet (100 mg total) by mouth 2 (two) times daily. (Patient not taking: Reported on 11/17/2017)  . furosemide (LASIX) 40 MG tablet Take 1 tablet (40 mg total) by mouth daily.  . potassium chloride (K-DUR) 10 MEQ tablet Take 1 tablet (10 mEq total) by mouth daily. (Patient not taking: Reported on 11/17/2017)  . predniSONE (DELTASONE) 10 MG tablet 4 tablets x 2 days, 3 tabs x 2 days, 2 tabs x 2 days, 1 tab x 2 days (Patient not taking: Reported on 11/17/2017)   No facility-administered encounter medications on file as of 11/17/2017.     Allergies as of 11/17/2017 - Review  Complete 11/17/2017  Allergen Reaction Noted  . Simvastatin Other (See Comments)   . Celecoxib Other (See Comments)   . Codeine Other (See Comments)   . Nsaids Other (See Comments)   . Tape Other (See Comments) 03/23/2017  . Cephalexin Itching and Rash   . Latex Rash 03/23/2017    Past Medical History:  Diagnosis Date  . Allergy   . Arthritis   . Ascending aortic dissection ([place]) 03/23/2017  . Atrial fibrillation ([place])   . Cataract    removed both eyes     . GERD (gastroesophageal reflux disease)   . HYPERLIPIDEMIA   . 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 Hypertension   . Microscopic hematuria    negative cystoscopy  . NONSPECIFIC ABNORMAL ELECTROCARDIOGRAM   . Pleural effusion on right   . Pleural effusion, bilateral   . S/P aortic dissection repair 03/24/2017   Biological Bentall aortic root replacement + resection and grafting of entire ascending aorta, transverse aortic arch and proximal descending thoracic aorta with elephant trunk distal anastomosis and debranching of aortic arch vessels  . 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 Varicose veins     Past Surgical History:  Procedure Laterality Date  . APPENDECTOMY    . CARDIAC CATHETERIZATION      X3;Dr Caryl Comes, last in July, 2013  . COLONOSCOPY    . CORONARY ANGIOPLASTY     04/24/13 : he denies angioplasty  . CYSTOSCOPY  1978   Dr Hartley Barefoot  . KNEE ARTHROSCOPY  2012   Dr Theda Sers  . LUMBAR LAMINECTOMY  1987   Dr Durward Fortes  . MYELOGRAM  2007  . PERICARDIOCENTESIS N/A 04/07/2017   Procedure: PERICARDIOCENTESIS;  Surgeon: Sherren Mocha, MD;  Location: [facility];  Service: Cardiovascular;  Laterality: N/A;  . POLYPECTOMY    . REPAIR OF ACUTE ASCENDING THORACIC AORTIC DISSECTION N/A 03/23/2017   Procedure: REPAIR OF ACUTE ASCENDING THORACIC AORTIC DISSECTION.  Bentall procedure.  Aortic root repleacement with bioprosthetic valve.  Reimplantation of left and right coronary arteries.  Total resection of transverse aortic arch.  Elephant trunk distal anastomosis and debranching of arch vessels.;  Surgeon: Rexene Alberts, MD;  Location: [facility];  Service: Vascular;  Laterality: N/A;  . ROTATOR CUFF REPAIR    . SHOULDER ARTHROSCOPY  2011   Dr Theda Sers  . SHOULDER  ARTHROSCOPY Right 08/2015  . SPINAL FUSION  1986   Dr Rolin Barry  . TEE WITHOUT CARDIOVERSION N/A 03/23/2017   Procedure: TRANSESOPHAGEAL ECHOCARDIOGRAM (TEE);  Surgeon: Rexene Alberts, MD;  Location: [facility];  Service: Open Heart Surgery;  Laterality: N/A;  . TRACHEOSTOMY     age 60 for croup    Family History  Problem Relation Age of Onset  . Hypertension Mother   . Hypertension Father   . Benign prostatic hyperplasia Father        S/P TURP  . Heart attack Maternal Grandmother        MI in 28s  . Breast cancer Maternal Grandmother   . Arrhythmia Brother         X 2  . Heart attack Maternal Aunt        MI in 65s  . Stroke Neg Hx   . Diabetes Neg Hx   . Colon  cancer Neg Hx   . Colon polyps Neg Hx   . Stomach cancer Neg Hx   . Rectal cancer Neg Hx   . Esophageal cancer Neg Hx   . Prostate cancer Neg Hx     Social History   Socioeconomic History  . Marital status: Married    Spouse name: Not on file  . Number of children: 2  . Years of education: Not on file  . Highest education level: Not on file  Occupational History  . 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 Financial resource strain: Not on file  . Food insecurity:    Worry: Not on file    Inability: Not on file  . Transportation needs:    Medical: Not on file    Non-medical: Not on file  Tobacco Use  . Smoking status: Former Smoker    Packs/day: 2.00    Years: 45.00    Pack years: 90.00    Types: Cigarettes    Last attempt to quit: 03/22/2017    Years since quitting: 0.6  . Smokeless tobacco: Never Used  . Tobacco comment: 2 ppd , quit 03/2017  Substance and Sexual Activity  . Alcohol use: Yes    Alcohol/week: 0.0 oz    Comment: 5 cans of beer daily  . Drug use: No  . Sexual activity: Never  Lifestyle  . Physical activity:    Days per week: Not on file    Minutes per session: Not on file  . Stress: Not on file  Relationships  . Social connections:    Talks on phone: Not on file     Gets together: Not on file    Attends religious service: Not on file    Active member of club or organization: Not on file    Attends meetings of clubs or organizations: Not on file    Relationship status: Not on file  . Intimate partner violence:    Fear of current or ex partner: Not on file    Emotionally abused: Not on file    Physically abused: Not on file    Forced sexual activity: Not on file  Other Topics Concern  . Not on file  Social History Narrative   Lives w/ wife    Review of systems: Review of Systems  Constitutional: Negative for fever and chills.  HENT: Negative.   Eyes: Negative for blurred vision.  Respiratory: as per HPI  Cardiovascular: Negative for chest pain and palpitations.  Gastrointestinal: Negative for vomiting, diarrhea, blood per rectum. Genitourinary: Negative for dysuria, urgency, frequency and hematuria.  Musculoskeletal: Negative for myalgias, back pain and joint pain.  Skin: Negative for itching and rash.  Neurological: Negative for dizziness, tremors, focal weakness, seizures and loss of consciousness.  Endo/Heme/Allergies: Negative for environmental allergies.  Psychiatric/Behavioral: Negative for depression, suicidal ideas and hallucinations.  All other systems reviewed and are negative.  Physical Exam: Blood pressure 140/62, pulse (!) 58, height 6' (1.829 m), weight 180 lb 8 oz (81.9 kg), SpO2 92 %. Gen:      No acute distress HEENT:  EOMI, sclera anicteric Neck:     No masses; no thyromegaly Lungs:    Clear to auscultation bilaterally; normal respiratory effort CV:         Regular rate and rhythm; no murmurs Abd:      + bowel sounds; soft, non-tender; no palpable masses, no distension Ext:    No edema; adequate peripheral perfusion Skin:  Warm and dry; no rash Neuro: alert and oriented x 3 Psych: normal mood and affect  Data Reviewed: PFTs 01/05/17-FVC 4.02 [80%], FEV1 3.03 [81%], F/F 75 12/24/15-FVC 4.27 [91%], FEV1 2.39 [86%],  F/F 79 Normal spirometry  12/25/13 FVC 4.96 [103%], FEV1 3.51 [97%], F/F 71, p.o. 314%, DLCO 72% Minimal obstruction, small airway disease, minimal diffusion defect  Imaging CT 03/22/17- moderate emphysema, left upper lobe 3.6 mm nodule. CT angios 08/23/17-emphysema, large-moderate right and left effusion. Chest x-ray 09/20/17- improved size of pleural effusion.  Minimal basilar atelectasis, hyperinflation. I have reviewed the images personally.  Assessment:  Evaluation for COPD Emphysematous changes noted on CT scan with minimal obstruction on PFTs from 2015.  Most recent spirometry does not show any obstruction We will reevaluate with full pulmonary function test Order Breo and incruse  Health maintenance Pneumovax 10/16/13, Prevnar 05/10/14 Flu vaccine 05/18/17  Plan/Recommendations: - Full PFTs - Nydia Bouton MD [facility] 11/17/2017, 11:13 AM  CC: Colon Branch, MD

## 2017-11-17 NOTE — Patient Instructions (Signed)
We will schedule you for pulmonary function test Give you prescription for Breo 200 and incruse Follow up in 2-3 months.

## 2017-11-22 ENCOUNTER — Other Ambulatory Visit: Payer: Self-pay | Admitting: *Deleted

## 2017-11-22 ENCOUNTER — Other Ambulatory Visit: Payer: Self-pay

## 2017-11-22 ENCOUNTER — Encounter: Payer: Medicare Other | Admitting: Thoracic Surgery (Cardiothoracic Vascular Surgery)

## 2017-11-22 ENCOUNTER — Ambulatory Visit
Admission: RE | Admit: 2017-11-22 | Discharge: 2017-11-22 | Disposition: A | Discharge status: Home / Self Care | Payer: Medicare Other | Source: Ambulatory Visit | Attending: Thoracic Surgery (Cardiothoracic Vascular Surgery) | Admitting: Thoracic Surgery (Cardiothoracic Vascular Surgery)

## 2017-11-22 ENCOUNTER — Other Ambulatory Visit: Payer: Medicare Other

## 2017-11-22 ENCOUNTER — Ambulatory Visit (INDEPENDENT_AMBULATORY_CARE_PROVIDER_SITE_OTHER): Payer: Medicare Other | Admitting: Thoracic Surgery (Cardiothoracic Vascular Surgery)

## 2017-11-22 ENCOUNTER — Encounter: Payer: Self-pay | Admitting: Thoracic Surgery (Cardiothoracic Vascular Surgery)

## 2017-11-22 VITALS — BP 122/84 | HR 64 | Resp 18 | Ht 72.0 in | Wt 180.4 lb

## 2017-11-22 DIAGNOSIS — Z953 Presence of xenogenic heart valve: Secondary | ICD-10-CM

## 2017-11-22 DIAGNOSIS — R600 Localized edema: Secondary | ICD-10-CM | POA: Diagnosis not present

## 2017-11-22 DIAGNOSIS — Z9889 Other specified postprocedural states: Secondary | ICD-10-CM | POA: Diagnosis not present

## 2017-11-22 DIAGNOSIS — I7101 Dissection of ascending aorta: Secondary | ICD-10-CM

## 2017-11-22 DIAGNOSIS — J90 Pleural effusion, not elsewhere classified: Secondary | ICD-10-CM

## 2017-11-22 DIAGNOSIS — I712 Thoracic aortic aneurysm, without rupture: Secondary | ICD-10-CM | POA: Diagnosis not present

## 2017-11-22 DIAGNOSIS — I714 Abdominal aortic aneurysm, without rupture: Secondary | ICD-10-CM | POA: Diagnosis not present

## 2017-11-22 MED ORDER — IOPAMIDOL (ISOVUE-370) INJECTION 76%
75.0000 mL | Freq: Once | INTRAVENOUS | Status: AC | PRN
  Administered 2017-11-22: 75 mL via INTRAVENOUS

## 2017-11-22 MED ORDER — ASPIRIN EC 81 MG PO TBEC: 81.0000 mg | DELAYED_RELEASE_TABLET | Freq: Every day | ORAL | Status: DC

## 2017-11-22 MED ORDER — FUROSEMIDE 40 MG PO TABS: 40.0000 mg | ORAL_TABLET | Freq: Every day | ORAL | 0 refills | Status: DC

## 2017-11-22 MED ORDER — POTASSIUM CHLORIDE ER 10 MEQ PO TBCR: 10.0000 meq | EXTENDED_RELEASE_TABLET | Freq: Every day | ORAL | 0 refills | Status: DC

## 2017-11-22 NOTE — Patient Instructions (Addendum)
Decrease your dose of aspirin to 81 mg daily. Continue all other previous medications without any changes at this time  Check your weight on a regular basis and keep a log for your records.  Look for signs of fluid overload such as worsening swelling of your lower legs, increased shortness of breath with activity, and/or a dry nonproductive cough.  Discussed these findings with your cardiologist including whether or not you should adjust your fluid pill dosage (diuretic).  Continue to monitor your blood pressure regularly and discuss any possible need to adjust your blood pressure medications with your cardiologist and/or your primary care physician  Patient is advised regarding the need for life long follow-up and physical restrictions related to the history of aortic dissection including the need for strict blood pressure control, periodic radiographic follow-up examinations, and avoidance of certain types of physical activity such as heavy lifting or other strenuous physical activities which tend to increase pressure within the chest or abdomen.

## 2017-11-22 NOTE — Progress Notes (Signed)
[address]       [place] [address]             [phone]     CARDIOTHORACIC SURGERY OFFICE NOTE  Referring Provider is Lacretia Leigh, MD  Primary Cardiologist is Deboraha Sprang, MD PCP is Colon Branch, MD   HPI:  Patient is a 67 year old male with history of chronic diastolic congestive heart failure, hypertension, ascending thoracic aortic aneurysm, paroxysmal atrial fibrillation, hyperlipidemia, GE reflux disease, COPD, and long-standing tobacco abuse who returns to the office today for routine surveillance status post emergency repair of acute type a aortic dissection using deep hypothermia and total circulatory arrest for resection and grafting of the entire ascending thoracic aorta, transverse aortic arch, and proximal descending thoracic aorta with elephant trunk distal anastomosis, debranching of the arch vessels and 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  He was last seen in our office September 20, 2017 at which time he was restarted on Lasix for acute exacerbation of chronic diastolic congestive heart failure with bilateral pleural effusions, right greater than left.  Since then he was seen in consultation by Dr. Vaughan Browner for chronic shortness of breath.  The patient stopped taking Lasix and potassium.  He has not been seen in follow-up in the cardiology office for quite some time.  He states that he still has shortness of breath and he reports that it is no better than it was 2 months ago.  He has no explanation for why he stopped taking his Lasix and potassium other than the fact that he let his prescription run out.  He has not had fevers chills or productive cough.   Current Outpatient Medications  Medication Sig Dispense Refill  . albuterol (PROAIR HFA) 108 (90 Base) MCG/ACT inhaler Inhale two puffs every four to six hours as needed for cough or  wheeze. (Patient taking differently: Inhale 2 puffs into the lungs See admin instructions. Every 4-6 hours as needed for coughing or wheezing) 3 Inhaler 0  . albuterol (PROVENTIL) (2.5 MG/3ML) 0.083% nebulizer solution Take 3 mLs (2.5 mg total) by nebulization every 8 (eight) hours as needed for wheezing or shortness of breath. 150 mL 1  . ALPRAZolam (XANAX) 1 MG tablet Take 1-1.5 tablets (1-1.5 mg total) by mouth at bedtime. 40 tablet 3  . aspirin EC 325 MG EC tablet Take 1 tablet (325 mg total) by mouth daily. 30 tablet 0  . augmented betamethasone dipropionate (DIPROLENE-AF) 0.05 % cream Apply topically 2 (two) times daily as needed. 100 g 3  . azelastine (ASTELIN) 0.1 % nasal spray Can use two sprays in each nostril twice daily as directed. (Patient taking differently: Place 2 sprays into both nostrils 2 (two) times daily. ) 90 mL 3  . budesonide-formoterol (SYMBICORT) 160-4.5 MCG/ACT inhaler Inhale 2 puffs into the lungs 2 (two) times daily. 1 Inhaler 3  . doxycycline (VIBRA-TABS) 100 MG tablet Take 1 tablet (100 mg total) by mouth 2 (two) times daily. 14 tablet 0  . escitalopram (LEXAPRO) 10 MG tablet Take 1 tablet (10 mg total) by mouth daily. 30 tablet 6  . esomeprazole (NEXIUM) 40 MG capsule Take 1 capsule (40 mg total) by mouth daily before breakfast. 90 capsule 3  . flecainide (TAMBOCOR) 100 MG tablet Take 1 tablet (100 mg total) by mouth 2 (two) times daily. 180 tablet 3  . fluticasone (FLONASE) 50 MCG/ACT  nasal spray Use one spray in each nostril twice daily. 48 g 3  . fluticasone furoate-vilanterol (BREO ELLIPTA) 200-25 MCG/INH AEPB Inhale 1 puff into the lungs daily. 180 each 3  . metoprolol tartrate (LOPRESSOR) 25 MG tablet Take 2 tablets (50 mg total) by mouth 2 (two) times daily. 180 tablet 3  . potassium chloride (K-DUR) 10 MEQ tablet Take 1 tablet (10 mEq total) by mouth daily. 14 tablet 0  . predniSONE (DELTASONE) 10 MG tablet 4 tablets x 2 days, 3 tabs x 2 days, 2 tabs x 2 days,  1 tab x 2 days 20 tablet 0  . furosemide (LASIX) 40 MG tablet Take 1 tablet (40 mg total) by mouth daily. 14 tablet 0   No current facility-administered medications for this visit.       Physical Exam:   BP 122/84 (BP Location: Left Arm, Patient Position: Sitting, Cuff Size: Normal)   Pulse 64   Resp 18   Ht 6' (1.829 m)   Wt 180 lb 6.4 oz (81.8 kg)   SpO2 92% Comment: RA  BMI 24.47 kg/m   General:  well-appearing  Chest:   Diminished breath sounds both lung bases otherwise clear  CV:   Regular rate and rhythm without murmur  Incisions:  n/a  Abdomen:  Soft nontender  Extremities:  Warm and well-perfused  Diagnostic Tests:  CT ANGIOGRAPHY CHEST, ABDOMEN AND PELVIS  TECHNIQUE: Multidetector CT imaging through the chest, abdomen and pelvis was performed using the standard protocol during bolus administration of intravenous contrast. Multiplanar reconstructed images and MIPs were obtained and reviewed to evaluate the vascular anatomy.  CONTRAST:  20mL ISOVUE-370 IOPAMIDOL (ISOVUE-370) INJECTION 76%  COMPARISON:  Baseline CT 03/23/2017, first postop CT 03/31/2017 and most recent comparison 08/23/2017.  FINDINGS: CTA CHEST FINDINGS  Cardiovascular:  Heart:  Unchanged appearance of cardiomegaly. No pericardial fluid/thickening on the current study. Calcifications of left anterior descending, circumflex, right coronary arteries.  Aorta:  Redemonstration of postsurgical repair of biologic aortic valve replacement with coronary reimplantation (Bentall) and complete arch replacement with reimplantation of the branch vessels.  Proximal and distal aortic anastomosis unremarkable.  The Y graft of the branch vessels is patent, with patency maintained of the innominate graft, right subclavian artery, proximal right common carotid artery. The left common carotid artery is patent. Left subclavian artery is patent.  Right vertebral artery is patent from the  native origin to the vertebral foramen. Left vertebral artery is not visualized.  The native aorta beyond the arch anastomosis again is aneurysmal. Greatest diameter measured on the current CT in a similar location to the prior is 5.5 4 cm. Mixed density within the false lumen at this site compatible with slow perfusion. The source appears to be secondary to retrograde flow from segmental arteries/intercostal arteries.  Native aortic diameter on the baseline study measures 4.0 cm in the proximal descending thoracic aorta.  In the mid descending thoracic aorta of the false lumen is low-density,, which does not appear to communicate with either the superior segment perfused by intercostals, or the inferior segment perfused by retrograde flow.  Lower thoracic aorta is high density secondary to retrograde perfusion from the abdominal aorta.  Greatest diameter at the hiatus measures 3.9 cm on the current CT. Native diameter at this location on the baseline study measures 3.2 cm.  Pulmonary arteries:  No central, lobar, segmental filling defects.  Mediastinum/Nodes: No mediastinal adenopathy. Unremarkable appearance of the thoracic esophagus.  Unremarkable appearance of the thoracic inlet and thyroid.  Lungs/Pleura: Centrilobular and paraseptal emphysema. Bilateral pleural effusions with associated atelectasis.  No pneumothorax.  Musculoskeletal: No acute displaced fracture. Degenerative changes of the spine.  Review of the MIP images confirms the above findings.  CTA ABDOMEN AND PELVIS FINDINGS  VASCULAR  Aorta: Abdominal aorta again demonstrates dissection flap extending to the bifurcation, with the true lumen perfusing the right lower extremity and the dissection flap terminating in the proximal left common iliac artery.  As was previously demonstrated, the true lumen perfuses celiac artery, superior mesenteric artery, left renal artery. There  is asymmetric perfusion of the right renal artery which is decreasing from the comparison. The dissection flap is felt to enter the right renal artery with potentially a dynamic partial obstruction. Additionally there is a smaller accessory right renal artery to the lower pole of the right kidney which remains patent from the true lumen. Small fenestration of the dissection flap identified on image 177 of series 9 and image 201 of series 9.  Inferior mesenteric artery arises from the true lumen.  Celiac: Patent with no significant atherosclerotic changes.  SMA: Patent with no significant atherosclerotic changes.  Renals: The main left renal artery from the true lumen is patent. Accessory left renal artery from true lumen is patent to the lower pole.  The right renal artery arises at the inflection point of the dissection flap, and is decreased in attenuation compared to the left. This may reflect adynamic obstruction. There is a small right accessory renal artery from the true lumen perfusing the lower pole.  IMA: IMA patent from the true lumen.  Right lower extremity:  Moderate atherosclerotic changes of the right iliac system. Hypogastric artery is patent. External iliac artery is patent. Common femoral artery patent. Proximal SFA and profunda femoris patent.  Left lower extremity:  Dissection flap enters the left common iliac artery, terminating proximally. External iliac artery patent with moderate atherosclerotic changes. Hypogastric artery is patent. External iliac artery is patent. Common femoral artery is patent. Proximal SFA and profunda femoris patent.  Veins: Unremarkable appearance of the venous system.  Review of the MIP images confirms the above findings.  NON-VASCULAR  Hepatobiliary: Cranial caudal span of the liver measures 21 cm unremarkable gallbladder  Pancreas: Unremarkable pancreas  Spleen: Unremarkable  spleen  Adrenals/Urinary Tract: Unremarkable adrenal glands.  Right:  Relatively symmetric perfusion of the right kidney to the left kidney. No hydronephrosis. No nephrolithiasis.  Left:  Symmetric perfusion of the left kidney to the right. No hydronephrosis or nephrolithiasis.  Unremarkable appearance of the urinary bladder .  Stomach/Bowel: Unremarkable appearance of the stomach. Unremarkable appearance of small bowel. No evidence of obstruction. Colonic diverticula without associated inflammatory changes. Appendix is not visualized, however, no inflammatory changes are present adjacent to the cecum to indicate an appendicitis.  Mesenteric: No free fluid or air. No adenopathy.  Reproductive: Unremarkable appearance of the pelvic organs.  Other: No hernia.  Musculoskeletal: No acute displaced fracture.  IMPRESSION: Redemonstration of surgical repair of type A dissection with aortic root replacement, complete arch replacement with elephant trunk, and arch de-branching. Similar extent of the descending component of the dissection flap.  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 The diameter of the aorta at the hiatus measures 3.9 cm, which is larger than the baseline study when the diameter measured approximately 3.2 cm. There appear to be 2  source of perfusion into the false channel, cranially from retrograde intercostal artery flow, and caudally from retrograde flow through the dissection flap fenestrations.  These results were discussed by telephone at the time of interpretation on 11/22/2017 at 4:59 pm with Dr. Darylene Price.  There appears to be dynamic obstruction of the main right renal artery, with asymmetric flow compared to the left at the origin, in the region  of the dissection flap entry.  Bilateral pleural effusions with associated atelectasis.  Centrilobular and paraseptal emphysema.  Emphysema (ICD10-J43.9).  Hepatomegaly  Signed,  Dulcy Fanny. Earleen Newport, DO  [facility]  [facility]   Electronically Signed   By: Corrie Mckusick D.O.   On: 11/22/2017 17:05    Impression:  Patient has chronic diastolic congestive heart failure with persistent shortness of breath and persistent bilateral pleural effusions, right greater than left.  He has not been seen in follow-up in the cardiology office and he stopped taking the diuretic I prescribed when he presented to our office 2 months ago.  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  I have personally reviewed the follow-up CT angiogram performed earlier today.  Patient has small to moderate sized bilateral pleural effusions, right greater than left.  He also has some degree of interval enlargement of the chronically dissected descending thoracic aorta.   Plan:  I have discussed matters at length with the patient and his wife in the office today.  We discussed the results of his CT angiogram including concerns regarding the possible interval increase in size of the descending thoracic aorta.  The multiple reasons for shortness of breath have been discussed including the persistence of bilateral pleural effusions, right greater than left.  I feel  the patient needs to be seen in follow-up by cardiology and will likely need long-term diuretic therapy as a component to his medical treatment.  I have given him a 30-day refill prescription for Lasix and potassium.  In the future I will defer any subsequent changes in his medical therapy to his cardiologist and his primary care physician.  We will request ultrasound-guided right needle thoracentesis for therapeutic benefit.  I would be reluctant to perform thoracentesis on the left side for obvious reasons.  Finally, the patient will be referred to Dr. Trula Slade at [facility].  At some point in the future he might need endovascular repair of his descending thoracic aorta if it continues to enlarge.  We will plan follow-up CT angiogram in 6 months.  All questions answered.  I spent in excess of 15 minutes during the conduct of this office consultation and >50% of this time involved direct face-to-face encounter with the patient for counseling and/or coordination of their care.    Valentina Gu. Roxy Manns, MD 11/22/2017 4:33 PM

## 2017-11-25 ENCOUNTER — Ambulatory Visit (HOSPITAL_COMMUNITY)
Admission: RE | Admit: 2017-11-25 | Discharge: 2017-11-25 | Disposition: A | Discharge status: Home / Self Care | Payer: Medicare Other | Source: Ambulatory Visit | Attending: Student | Admitting: Student

## 2017-11-25 ENCOUNTER — Encounter (HOSPITAL_COMMUNITY): Payer: Self-pay | Admitting: Student

## 2017-11-25 ENCOUNTER — Ambulatory Visit (HOSPITAL_COMMUNITY)
Admission: RE | Admit: 2017-11-25 | Discharge: 2017-11-25 | Disposition: A | Discharge status: Home / Self Care | Payer: Medicare Other | Source: Ambulatory Visit | Attending: Thoracic Surgery (Cardiothoracic Vascular Surgery) | Admitting: Thoracic Surgery (Cardiothoracic Vascular Surgery)

## 2017-11-25 DIAGNOSIS — Z9889 Other specified postprocedural states: Secondary | ICD-10-CM | POA: Diagnosis not present

## 2017-11-25 DIAGNOSIS — J90 Pleural effusion, not elsewhere classified: Secondary | ICD-10-CM | POA: Diagnosis not present

## 2017-11-25 DIAGNOSIS — J9811 Atelectasis: Secondary | ICD-10-CM | POA: Diagnosis not present

## 2017-11-25 HISTORY — PX: IR THORACENTESIS ASP PLEURAL SPACE W/IMG GUIDE: IMG5380

## 2017-11-25 MED ORDER — LIDOCAINE HCL (PF) 2 % IJ SOLN
INTRAMUSCULAR | Status: AC
  Filled 2017-11-25: qty 20

## 2017-11-25 MED ORDER — DIPHENHYDRAMINE HCL 25 MG PO CAPS
25.0000 mg | ORAL_CAPSULE | Freq: Once | ORAL | Status: DC
  Filled 2017-11-25: qty 1

## 2017-11-25 NOTE — Procedures (Signed)
PROCEDURE SUMMARY:  Successful US guided therapeutic right thoracentesis. Yielded 1.8 liters of yellow fluid. Pt tolerated procedure well. No immediate complications.  Specimen was not sent for labs. CXR ordered.  Docia Barrier PA-C 11/25/2017 10:06 AM

## 2017-12-13 ENCOUNTER — Other Ambulatory Visit: Payer: Self-pay | Admitting: Internal Medicine

## 2017-12-14 ENCOUNTER — Ambulatory Visit (INDEPENDENT_AMBULATORY_CARE_PROVIDER_SITE_OTHER): Payer: Medicare Other | Admitting: Internal Medicine

## 2017-12-14 ENCOUNTER — Encounter: Payer: Self-pay | Admitting: Internal Medicine

## 2017-12-14 VITALS — BP 108/62 | HR 48 | Temp 97.8°F | Resp 16 | Ht 72.0 in | Wt 178.4 lb

## 2017-12-14 DIAGNOSIS — J449 Chronic obstructive pulmonary disease, unspecified: Secondary | ICD-10-CM | POA: Diagnosis not present

## 2017-12-14 DIAGNOSIS — R0602 Shortness of breath: Secondary | ICD-10-CM | POA: Diagnosis not present

## 2017-12-14 DIAGNOSIS — N400 Enlarged prostate without lower urinary tract symptoms: Secondary | ICD-10-CM

## 2017-12-14 DIAGNOSIS — I10 Essential (primary) hypertension: Secondary | ICD-10-CM | POA: Diagnosis not present

## 2017-12-14 DIAGNOSIS — I5032 Chronic diastolic (congestive) heart failure: Secondary | ICD-10-CM

## 2017-12-14 DIAGNOSIS — R972 Elevated prostate specific antigen [PSA]: Secondary | ICD-10-CM | POA: Diagnosis not present

## 2017-12-14 LAB — BASIC METABOLIC PANEL
BUN: 10 mg/dL (ref 6–23)
CALCIUM: 9.2 mg/dL (ref 8.4–10.5)
CHLORIDE: 97 meq/L (ref 96–112)
CO2: 31 meq/L (ref 19–32)
Creatinine, Ser: 0.94 mg/dL (ref 0.40–1.50)
GFR: 84.81 mL/min (ref >60.00)
Glucose, Bld: 80 mg/dL (ref 70–99)
Potassium: 4.8 mEq/L (ref 3.5–5.1)
SODIUM: 134 meq/L — AB (ref 135–145)

## 2017-12-14 LAB — PSA: PSA: 0.78 ng/mL (ref 0.10–4.00)

## 2017-12-14 NOTE — Progress Notes (Signed)
Pre visit review using our clinic review tool, if applicable. No additional management support is needed unless otherwise documented below in the visit note. 

## 2017-12-14 NOTE — Progress Notes (Signed)
Subjective:    Patient ID: Sean Riley, male    DOB: 1951-06-08, 67 y.o.   MRN: [id number]  DOS:  12/14/2017 Type of visit - description : rov, here w/ his wife Interval history: They have several concerns He has developed a sore on the left ankle, the area seems irritated on and off since he had the aorta repair last year.  Denies any bleeding, pain.  Occasionally oozing clear fluid Saw Dr. Ricard Dillon, note reviewed. Had a thoracentesis, immediately his SOB decreased. COPD: Note from pulmonary reviewed EtOH: Continue with heavy drinking   Wt Readings from Last 3 Encounters:  12/14/17 178 lb 6 oz (80.9 kg)  11/22/17 180 lb 6.4 oz (81.8 kg)  11/17/17 180 lb 8 oz (81.9 kg)     Review of Systems Denies claudication Lower extremity edema decreased after he started Lasix. BP today is a little low, he is asymptomatic denies dizziness, presyncope. PSA was elevated at some point, denies dysuria, gross hematuria or difficulty urinating Has cough and some sputum production in the mornings  Past Medical History:  Diagnosis Date  . Allergy   . Arthritis   . Ascending aortic dissection ([place]) 03/23/2017  . Atrial fibrillation ([place])   . Cataract    removed both eyes   . GERD (gastroesophageal reflux disease)   . HYPERLIPIDEMIA   . 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 Hypertension   . Microscopic hematuria    negative cystoscopy  . NONSPECIFIC ABNORMAL ELECTROCARDIOGRAM   . Pleural effusion on right   . Pleural effusion, bilateral   . S/P aortic dissection repair 03/24/2017   Biological Bentall aortic root replacement + resection and grafting of entire ascending aorta, transverse aortic arch and proximal descending thoracic aorta with elephant trunk distal anastomosis and debranching of aortic arch vessels  . 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 Varicose veins     Past Surgical History:  Procedure Laterality Date  . APPENDECTOMY    . CARDIAC CATHETERIZATION      X3;Dr Caryl Comes, last in July, 2013  . COLONOSCOPY    . CORONARY ANGIOPLASTY     04/24/13 : he denies angioplasty  . CYSTOSCOPY  1978   Dr Hartley Barefoot  . IR THORACENTESIS ASP PLEURAL SPACE W/IMG GUIDE  11/25/2017  . KNEE ARTHROSCOPY  2012   Dr Theda Sers  . LUMBAR LAMINECTOMY  1987   Dr Durward Fortes  . MYELOGRAM  2007  . PERICARDIOCENTESIS N/A 04/07/2017   Procedure: PERICARDIOCENTESIS;  Surgeon: Sherren Mocha, MD;  Location: [place] [facility];  Service: Cardiovascular;  Laterality: N/A;  . POLYPECTOMY    . REPAIR OF ACUTE ASCENDING THORACIC AORTIC DISSECTION N/A 03/23/2017   Procedure: REPAIR OF ACUTE ASCENDING THORACIC AORTIC DISSECTION.  Bentall procedure.  Aortic root repleacement with bioprosthetic valve.  Reimplantation of left and right coronary arteries.  Total resection of transverse aortic arch.  Elephant trunk distal anastomosis and debranching of arch vessels.;  Surgeon: Rexene Alberts, MD;  Location: [place];  Service: Vascular;  Laterality: N/A;  . ROTATOR CUFF REPAIR    . SHOULDER ARTHROSCOPY  2011   Dr Theda Sers  . SHOULDER ARTHROSCOPY Right 08/2015  . SPINAL FUSION  1986   Dr Rolin Barry  . TEE WITHOUT CARDIOVERSION N/A 03/23/2017   Procedure:  TRANSESOPHAGEAL ECHOCARDIOGRAM (TEE);  Surgeon: Rexene Alberts, MD;  Location: [place];  Service: Open Heart Surgery;  Laterality: N/A;  . TRACHEOSTOMY     age 16 for croup    Social History   Socioeconomic History  . Marital status: Married    Spouse name: Not on file  . Number of children: 2  . Years of education: Not on file  . Highest education level: Not on file  Occupational History  . 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 Financial resource strain: Not on file  . Food insecurity:    Worry: Not on file    Inability:  Not on file  . Transportation needs:    Medical: Not on file    Non-medical: Not on file  Tobacco Use  . Smoking status: Former Smoker    Packs/day: 2.00    Years: 45.00    Pack years: 90.00    Types: Cigarettes    Last attempt to quit: 03/22/2017    Years since quitting: 0.7  . Smokeless tobacco: Never Used  . Tobacco comment: 2 ppd , quit 03/2017  Substance and Sexual Activity  . Alcohol use: Yes    Alcohol/week: 0.0 oz    Comment: 5 cans of beer daily  . Drug use: No  . Sexual activity: Never  Lifestyle  . Physical activity:    Days per week: Not on file    Minutes per session: Not on file  . Stress: Not on file  Relationships  . Social connections:    Talks on phone: Not on file    Gets together: Not on file    Attends religious service: Not on file    Active member of club or organization: Not on file    Attends meetings of clubs or organizations: Not on file    Relationship status: Not on file  . Intimate partner violence:    Fear of current or ex partner: Not on file    Emotionally abused: Not on file    Physically abused: Not on file    Forced sexual activity: Not on file  Other Topics Concern  . Not on file  Social History Narrative   Lives w/ wife      Allergies as of 12/14/2017      Reactions   Simvastatin Other (See Comments)   Mental status changes   Celecoxib Other (See Comments)   GI UPSET AND INFLAMMATION   Codeine Other (See Comments)   GI UPSET AND INFLAMMATION   Nsaids Other (See Comments)   GI UPSET AND INFLAMMATION (can tolerate via IV)   Tape Other (See Comments)   Medical tape and Band-Aids PULL OFF THE SKIN; please use Coban wrap   Cephalexin Itching, Rash   Latex Rash      Medication List        Accurate as of 12/14/17 11:59 PM. Always use your most recent med list.          albuterol 108 (90 Base) MCG/ACT inhaler Commonly known as:  PROAIR HFA Inhale two puffs every four to six hours as needed for cough or wheeze.     albuterol (2.5 MG/3ML) 0.083% nebulizer solution Commonly known as:  PROVENTIL Take 3 mLs (2.5 mg total) by nebulization every 8 (eight) hours as needed for wheezing or shortness of breath.   ALPRAZolam 1 MG tablet Commonly known as:  XANAX Take 1-1.5 tablets (1-1.5 mg total) by mouth at bedtime.   aspirin  EC 81 MG tablet Take 1 tablet (81 mg total) by mouth daily.   augmented betamethasone dipropionate 0.05 % cream Commonly known as:  DIPROLENE-AF Apply topically 2 (two) times daily as needed.   azelastine 0.1 % nasal spray Commonly known as:  ASTELIN Can use two sprays in each nostril twice daily as directed.   escitalopram 10 MG tablet Commonly known as:  LEXAPRO Take 1 tablet (10 mg total) by mouth daily.   esomeprazole 40 MG capsule Commonly known as:  NEXIUM Take 1 capsule (40 mg total) by mouth daily before breakfast.   flecainide 100 MG tablet Commonly known as:  TAMBOCOR Take 1 tablet (100 mg total) by mouth 2 (two) times daily.   fluticasone 50 MCG/ACT nasal spray Commonly known as:  FLONASE Use one spray in each nostril twice daily.   fluticasone furoate-vilanterol 200-25 MCG/INH Aepb Commonly known as:  BREO ELLIPTA Inhale 1 puff into the lungs daily.   furosemide 40 MG tablet Commonly known as:  LASIX Take 1 tablet (40 mg total) by mouth daily.   INCRUSE ELLIPTA 62.5 MCG/INH Aepb Generic drug:  umeclidinium bromide Inhale 1 puff into the lungs daily.   metoprolol tartrate 25 MG tablet Commonly known as:  LOPRESSOR Take 2 tablets (50 mg total) by mouth 2 (two) times daily.   potassium chloride 10 MEQ tablet Commonly known as:  K-DUR Take 1 tablet (10 mEq total) by mouth daily.          Objective:   Physical Exam BP 108/62 (BP Location: Left Arm, Patient Position: Sitting, Cuff Size: Small)   Pulse (!) 48   Temp 97.8 F (36.6 C) (Oral)   Resp 16   Ht 6' (1.829 m)   Wt 178 lb 6 oz (80.9 kg)   SpO2 97%   BMI 24.19 kg/m  General:    Well developed, well nourished . NAD.  HEENT:  Normocephalic . Face symmetric, atraumatic Lungs:  Few rhonchi, no wheezing Normal respiratory effort, no intercostal retractions, no accessory muscle use. Heart: RRR, + systolic murmur.  Trace peri-ankle edema, no pretibial edema bilaterally  Palpable, bilateral pedal pulses Skin: Left lateral malleolus, skin is thin but currently with no ulcers. Rectal: External abnormalities: none. Normal sphincter tone. No rectal masses or tenderness.  No stools found Prostate: Prostate gland firm and smooth, no enlargement, nodularity, tenderness, mass, asymmetry or induration Neurologic:  alert & oriented X3.  Speech normal, gait appropriate for age and unassisted Psych--  Cognition and judgment appear intact.  Cooperative with normal attention span and concentration.  Behavior appropriate. No anxious or depressed appearing.      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 Tried a cpap. [place] (-) 07-2015 CV: --P- Atrial fibrillation Dr Caryl Comes, on NSR, no anticoag --lexiscan 2013 --Cardiac catheterization July 2013: Normal LV FX,, mild nonobstructive CAD Dr. Angelena Form --thoracis Ao  dissection , acute, surgery 03-2017 GU:  --BPH, microscopic hematuria (negative cystoscopy 1978) --Elevated PSA: saw urology  04-2016 Gynecomastia, Dr. Loanne Drilling  >> Bx (-) 10-2014 ; took tamoxifen temporarily (self d/c 06-2016), improved Hypogonadism-- dx per Dr Loanne Drilling, numbers improved d/t nolvadex  Sees dermatology : eczema? rx taclonex H/o  Tracheostomy , age 68, croup  PLAN: HTN: Since the last visit, Dr. Ricard Dillon restarted Lasix and potassium, BP  today is in the low side but at goal d/t h/o aortic disease; has no sx of low BP;  check a BMP.  Continue Lasix, potassium, metoprolol.  RF meds with results Diastolic  CHF: Was recommended to see cardiology, will refer. Hyperlipidemia: Diet controlled.  Last LDL satisfactory EtOH: Still an issue, consequences discussed with patient, does not seem to be inclined to change. BPH, elevated elevated PSA: s/p urology eval 2017, at that time PSA was elevated, apparently it was recheck later and it came back normal (per [facility]).  DRE today seems normal, recheck a PSA.  Has no symptoms. Emphysema: Saw pulmonary last month, currently on Incruse and Breo Ellipta.  Seems to be doing well. Aortic dissection: Saw Dr. Ricard Dillon recently, had a CT chest angiogram (concerns regarding increasing size of a descending thoracic aorta), he had persistent SOB and bilateral pleural effusions.  He was rx to see cardiology, was Rx Lasix.  Had a right-sided thoracentesis: Felt immediately less SOB after the procedure. He might need in the future endovascular repair of the thoracic aorta.  They are planning a CT in 6 months. Ankle sores: He has some skin thinning there, no obvious ulcer, will monitor for now. RTC 4 months.

## 2017-12-14 NOTE — Patient Instructions (Signed)
GO TO THE LAB : Get the blood work     GO TO THE FRONT DESK Schedule your next appointment for a   checkup in 4 months  

## 2017-12-15 ENCOUNTER — Telehealth: Payer: Self-pay | Admitting: Internal Medicine

## 2017-12-15 DIAGNOSIS — R600 Localized edema: Secondary | ICD-10-CM

## 2017-12-15 NOTE — Assessment & Plan Note (Signed)
HTN: Since the last visit, Dr. Ricard Dillon restarted Lasix and potassium, BP today is in the low side but at goal d/t h/o aortic disease; has no sx of low BP; check a BMP.  Continue Lasix, potassium, metoprolol.  RF meds with results Diastolic  CHF: Was recommended to see cardiology, will refer. Hyperlipidemia: Diet controlled.  Last LDL satisfactory EtOH: Still an issue, consequences discussed with patient, does not seem to be inclined to change. BPH, elevated elevated PSA: s/p urology eval 2017, at that time PSA was elevated, apparently it was recheck later and it came back normal (per [facility]).  DRE today seems normal, recheck a PSA.  Has no symptoms. Emphysema: Saw pulmonary last month, currently on Incruse and Breo Ellipta.  Seems to be doing well. Aortic dissection: Saw Dr. Ricard Dillon recently, had a CT chest angiogram (concerns regarding increasing size of a descending thoracic aorta), he had persistent SOB and bilateral pleural effusions.  He was rx to see cardiology, was Rx Lasix.  Had a right-sided thoracentesis: Felt immediately less SOB after the procedure. He might need in the future endovascular repair of the thoracic aorta.  They are planning a CT in 6 months. Ankle sores: He has some skin thinning there, no obvious ulcer, will monitor for now. RTC 4 months.

## 2017-12-15 NOTE — Telephone Encounter (Signed)
Copied from [place] [id number]. Topic: Quick Communication - See Telephone Encounter >> Dec 15, 2017 11:49 AM Rutherford Nail, NT wrote: CRM for notification. See Telephone encounter for: 12/15/17. [name], patient's wife, is wanting a call back. States she wanted to discuss patients potassium levels. Please advise. CB#: [phone]

## 2017-12-15 NOTE — Telephone Encounter (Signed)
Pt's labs have not been reviewed by PCP yet- once reviewed she will receive call or letter.

## 2017-12-16 MED ORDER — POTASSIUM CHLORIDE ER 10 MEQ PO TBCR: 10.0000 meq | EXTENDED_RELEASE_TABLET | Freq: Every day | ORAL | 5 refills | Status: DC

## 2017-12-16 MED ORDER — FUROSEMIDE 40 MG PO TABS: 40.0000 mg | ORAL_TABLET | Freq: Every day | ORAL | 5 refills | Status: DC

## 2017-12-16 NOTE — Telephone Encounter (Signed)
LMOM informing Sean Riley of lab results. Also informed that I have refilled Lasix and K-Dur to [facility]. Instructed her to call if questions/concerns. Okay for PEC to discuss.

## 2017-12-16 NOTE — Addendum Note (Signed)
Addended byDamita Dunnings D on: 12/16/2017 02:23 PM   Modules accepted: Orders

## 2017-12-16 NOTE — Telephone Encounter (Signed)
Advised patient, his labs came back very good. Please refill Lasix and potassium, he is doing well w/  them, recommend to continue same medications.

## 2017-12-16 NOTE — Telephone Encounter (Signed)
Pt. Wife is asking to speak to Dr. Larose Kells as he is out of medication and she is not sure if he needs to keep taking potassium and lasixs.   He goes to see Dr. Jens Som in June but had to have fluid removed 3 weeks ago out of lungs.  Please call her.

## 2017-12-20 ENCOUNTER — Encounter: Payer: Self-pay | Admitting: Internal Medicine

## 2017-12-20 ENCOUNTER — Encounter

## 2017-12-20 ENCOUNTER — Ambulatory Visit: Payer: Medicare Other | Admitting: Thoracic Surgery (Cardiothoracic Vascular Surgery)

## 2017-12-20 ENCOUNTER — Ambulatory Visit (INDEPENDENT_AMBULATORY_CARE_PROVIDER_SITE_OTHER): Payer: Medicare Other | Admitting: Internal Medicine

## 2017-12-20 VITALS — BP 140/64 | HR 74 | Ht 72.0 in | Wt 177.0 lb

## 2017-12-20 DIAGNOSIS — I480 Paroxysmal atrial fibrillation: Secondary | ICD-10-CM | POA: Diagnosis not present

## 2017-12-20 DIAGNOSIS — I5030 Unspecified diastolic (congestive) heart failure: Secondary | ICD-10-CM

## 2017-12-20 LAB — TSH: TSH: 1.51 u[IU]/mL (ref 0.450–4.500)

## 2017-12-20 MED ORDER — METOPROLOL TARTRATE 25 MG PO TABS: 50.0000 mg | ORAL_TABLET | Freq: Two times a day (BID) | ORAL | 3 refills | Status: DC

## 2017-12-20 NOTE — Progress Notes (Signed)
Patient Care Team: Colon Branch, MD as PCP - General (Internal Medicine) Deboraha Sprang, MD as Consulting Physician (Cardiology) Darleen Crocker, MD as Consulting Physician (Ophthalmology) Loletha Carrow Kirke Corin, MD as Consulting Physician (Gastroenterology) Neldon Mc Donnamarie Poag, MD as Consulting Physician (Allergy and Immunology) Rexene Alberts, MD as Consulting Physician (Cardiothoracic Surgery)   HPI  Sean Riley is a 67 y.o. male Seen With Chief Complaint of  atrial fibrillation. He continues to take flecainide; he's been largely asymptomatic without symptomatic recurrences.  There is a concern that he may be allergic to diltiazem as he has a pruritic rash   Pulmonary function testing 5/15 demonstrated mild emphysema..  There has been no syncope or presyncope.    He underwent Myoview scanning 2013 which was abnormal and underwent catheterization which was normal.   9/18  Echo mild LVH EF 50-55%; bioprosthesis w normal function, mean grad 13    Date PR QRS Flecainide  5/12  112   5/15  116 100  5/16  132 100  6/17  132 100  12/17  136 100  9/18 166 144 100  1/19 180 146 100  5/19 176 156 100     Hospitalized 8/18 for aortic dissection w root replacement, prosthetic AVR and reconstruction of entire ascending aorta cx by pericardial effusion requiring pericardiocentesis and some R hand injury  He is also been found to have worsening dilatation his descending aorta, and  at  the "hiatus",  Recently, his beta-blockers were increased because of the aneurysmal dilatation.  He is struggling with fatigue and feeling cold.  Thromboembolic risk factors ( age -60, HTN-1, Vasc disease -1) for a CHADSVASc Score of 3    Past Medical History:  Diagnosis Date  . Allergy   . Arthritis   . Ascending aortic dissection ([place]) 03/23/2017  . Atrial fibrillation ([place])   . Cataract    removed both eyes   . GERD (gastroesophageal reflux disease)   . HYPERLIPIDEMIA   . HYPERPLASIA  PROSTATE UNS W/O UR OBST & OTH LUTS   . Hypertension   . Microscopic hematuria    negative cystoscopy  . NONSPECIFIC ABNORMAL ELECTROCARDIOGRAM   . Pleural effusion on right   . Pleural effusion, bilateral   . S/P aortic dissection repair 03/24/2017   Biological Bentall aortic root replacement + resection and grafting of entire ascending aorta, transverse aortic arch and proximal descending thoracic aorta with elephant trunk distal anastomosis and debranching of aortic arch vessels  . 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 Varicose veins     Past Surgical History:  Procedure Laterality Date  . APPENDECTOMY    . CARDIAC CATHETERIZATION      X3;Dr Caryl Comes, last in July, 2013  . COLONOSCOPY    . CORONARY ANGIOPLASTY     04/24/13 : he denies angioplasty  . CYSTOSCOPY  1978   Dr Hartley Barefoot  . IR THORACENTESIS ASP PLEURAL SPACE W/IMG GUIDE  11/25/2017  . KNEE ARTHROSCOPY  2012   Dr Theda Sers  . LUMBAR LAMINECTOMY  1987   Dr Durward Fortes  . MYELOGRAM  2007  . PERICARDIOCENTESIS N/A 04/07/2017   Procedure: PERICARDIOCENTESIS;  Surgeon: Sherren Mocha, MD;  Location: [place] [facility];  Service: Cardiovascular;  Laterality: N/A;  . POLYPECTOMY    .  REPAIR OF ACUTE ASCENDING THORACIC AORTIC DISSECTION N/A 03/23/2017   Procedure: REPAIR OF ACUTE ASCENDING THORACIC AORTIC DISSECTION.  Bentall procedure.  Aortic root repleacement with bioprosthetic valve.  Reimplantation of left and right coronary arteries.  Total resection of transverse aortic arch.  Elephant trunk distal anastomosis and debranching of arch vessels.;  Surgeon: Rexene Alberts, MD;  Location: [place];  Service: Vascular;  Laterality: N/A;  . ROTATOR CUFF REPAIR    . SHOULDER ARTHROSCOPY  2011   Dr Theda Sers  . SHOULDER ARTHROSCOPY Right  08/2015  . SPINAL FUSION  1986   Dr Rolin Barry  . TEE WITHOUT CARDIOVERSION N/A 03/23/2017   Procedure: TRANSESOPHAGEAL ECHOCARDIOGRAM (TEE);  Surgeon: Rexene Alberts, MD;  Location: [place];  Service: Open Heart Surgery;  Laterality: N/A;  . TRACHEOSTOMY     age 66 for croup    Current Outpatient Medications  Medication Sig Dispense Refill  . albuterol (PROAIR HFA) 108 (90 Base) MCG/ACT inhaler Inhale two puffs every four to six hours as needed for cough or wheeze. (Patient taking differently: Inhale 2 puffs into the lungs See admin instructions. Every 4-6 hours as needed for coughing or wheezing) 3 Inhaler 0  . albuterol (PROVENTIL) (2.5 MG/3ML) 0.083% nebulizer solution Take 3 mLs (2.5 mg total) by nebulization every 8 (eight) hours as needed for wheezing or shortness of breath. 150 mL 1  . ALPRAZolam (XANAX) 1 MG tablet Take 1-1.5 tablets (1-1.5 mg total) by mouth at bedtime. 40 tablet 3  . aspirin EC 81 MG tablet Take 1 tablet (81 mg total) by mouth daily.    Marland Kitchen augmented betamethasone dipropionate (DIPROLENE-AF) 0.05 % cream Apply topically 2 (two) times daily as needed. 100 g 3  . azelastine (ASTELIN) 0.1 % nasal spray Can use two sprays in each nostril twice daily as directed. (Patient taking differently: Place 2 sprays into both nostrils 2 (two) times daily. ) 90 mL 3  . escitalopram (LEXAPRO) 10 MG tablet Take 1 tablet (10 mg total) by mouth daily. 30 tablet 5  . esomeprazole (NEXIUM) 40 MG capsule Take 1 capsule (40 mg total) by mouth daily before breakfast. 90 capsule 3  . flecainide (TAMBOCOR) 100 MG tablet Take 1 tablet (100 mg total) by mouth 2 (two) times daily. 180 tablet 3  . fluticasone (FLONASE) 50 MCG/ACT nasal spray Use one spray in each nostril twice daily. 48 g 3  . fluticasone furoate-vilanterol (BREO ELLIPTA) 200-25 MCG/INH AEPB Inhale 1 puff into the lungs daily. 180 each 3  . furosemide (LASIX) 40 MG tablet Take 1 tablet (40 mg total) by mouth daily. 30 tablet 5  .  metoprolol tartrate (LOPRESSOR) 25 MG tablet Take 2 tablets (50 mg total) by mouth 2 (two) times daily. 180 tablet 3  . potassium chloride (K-DUR) 10 MEQ tablet Take 1 tablet (10 mEq total) by mouth daily. 30 tablet 5  . umeclidinium bromide (INCRUSE ELLIPTA) 62.5 MCG/INH AEPB Inhale 1 puff into the lungs daily.     No current facility-administered medications for this visit.     Allergies  Allergen Reactions  . Simvastatin Other (See Comments)    Mental status changes  . Celecoxib Other (See Comments)    GI UPSET AND INFLAMMATION  . Codeine Other (See Comments)    GI UPSET AND INFLAMMATION  . Nsaids Other (See Comments)    GI UPSET AND INFLAMMATION (can tolerate via IV)  . Tape Other (See Comments)    Medical tape and Band-Aids PULL OFF THE  SKIN; please use Coban wrap  . Cephalexin Itching and Rash  . 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  These have been uptitrated to protect his aneurysmal dilatation.  Once these have been addressed preprocedurally, we can perhaps decrease the beta-blocker.  Alternatively, if it is felt to be better vascular and thoracic surgery, could consider pacing for chronotropic incompetence.  Blood pressure remains  a little bit elevated.  I will review this with Dr. Faye Ramsay.  Atrial fibrillation remains paroxysmal.  Cold intolerance we will check his TSH last checked 2 years ago.  It may also be related to his bradycardia.  We spent more than 50% of our >25 min visit in face to face counseling regarding the above

## 2017-12-20 NOTE — Patient Instructions (Signed)
Medication Instructions:  Your physician has recommended you make the following change in your medication:   1. Take Lasix, two tablets (80mg ), once per day for the next 3 days.  Labwork: You will have labs drawn today: TSH  Testing/Procedures: None ordered.  Follow-Up: Your physician recommends that you schedule a follow-up appointment in:  4 months with Dr Caryl Comes  Any Other Special Instructions Will Be Listed Below (If Applicable).     If you need a refill on your cardiac medications before your next appointment, please call your pharmacy.

## 2017-12-21 NOTE — Addendum Note (Signed)
Addended by: Campbell Riches on: 12/21/2017 08:03 AM   Modules accepted: Orders

## 2017-12-22 ENCOUNTER — Telehealth: Payer: Self-pay | Admitting: Internal Medicine

## 2017-12-22 NOTE — Telephone Encounter (Signed)
New message   Pt c/o medication issue:  1. Name of Medication: metoprolol tartrate (LOPRESSOR) 25 MG tablet  2. How are you currently taking this medication (dosage and times per day)? n/a  3. Are you having a reaction (difficulty breathing--STAT)? NO 4. What is your medication issue? Pharmacy calling to verify dosage, please call [phone] use REF # [id number]

## 2017-12-22 NOTE — Telephone Encounter (Signed)
Metoprolol tartrate 25 mg two tablets ( total of 50 mg) twice a day verified.  Dispense 90 days supply and [facility].

## 2017-12-30 ENCOUNTER — Encounter: Payer: Medicare Other | Admitting: Genetic Counselor

## 2018-01-10 ENCOUNTER — Ambulatory Visit (INDEPENDENT_AMBULATORY_CARE_PROVIDER_SITE_OTHER): Payer: Medicare Other | Admitting: Surgery

## 2018-01-10 ENCOUNTER — Encounter: Payer: Self-pay | Admitting: Surgery

## 2018-01-10 VITALS — BP 146/67 | HR 48 | Temp 97.6°F | Resp 18 | Ht 73.0 in | Wt 180.8 lb

## 2018-01-10 DIAGNOSIS — I712 Thoracic aortic aneurysm, without rupture, unspecified: Secondary | ICD-10-CM

## 2018-01-10 NOTE — Progress Notes (Signed)
[facility] [place]  Patient name: Sean Riley MRN: [id number] DOB: [birth year] Sex: male   REQUESTING PROVIDER:    Dr. Roxy Manns   REASON FOR CONSULT:    Aortic dissection  HISTORY OF PRESENT ILLNESS:   Sean Riley is a 67 y.o. 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  The patient presented with an ascending aortic aneurysm in August 2018.  He underwent complete aortic arch replacement with elephant trunk as well as bioprosthetic tissue valve.  He has been having issues with shortness of breath.  He is been given additional doses of Lasix and had thoracentesis in April.  He is now to the point where he feels his breathing is much better.  The patient is still having issues with hypertension as well as bradycardia.  He is being considered for pacemaker implantation.  He is scheduled to see pulmonary medicine later in June.  He does complain of some numbness on the right hand  PAST MEDICAL HISTORY    Past Medical History:  Diagnosis Date  . Allergy   . Arthritis   . Ascending aortic dissection ([place]) 03/23/2017  . Atrial fibrillation ([place])   . Cataract    removed both eyes   . GERD (gastroesophageal reflux disease)   . HYPERLIPIDEMIA   . 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 Hypertension   . Microscopic hematuria    negative cystoscopy  . NONSPECIFIC ABNORMAL ELECTROCARDIOGRAM   . Pleural effusion on right   . Pleural effusion, bilateral   . S/P aortic dissection repair 03/24/2017   Biological Bentall aortic root replacement + resection and grafting of entire ascending aorta, transverse aortic arch and proximal descending thoracic aorta with elephant trunk distal anastomosis and debranching of aortic arch vessels  . 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 Varicose veins      FAMILY HISTORY   Family History  Problem Relation Age of Onset  . Hypertension Mother   . Hypertension Father   . Benign prostatic hyperplasia Father        S/P TURP  . Heart attack Maternal Grandmother        MI in 84s  . Breast cancer Maternal Grandmother   . Arrhythmia Brother         X 2  . Heart attack Maternal Aunt        MI in 30s  . Stroke Neg Hx   . Diabetes Neg Hx   . Colon cancer Neg Hx   . Colon polyps Neg Hx   . Stomach cancer Neg Hx   . Rectal cancer Neg Hx   . Esophageal cancer Neg Hx   . Prostate cancer Neg Hx     SOCIAL HISTORY:   Social History   Socioeconomic History  . Marital status: Married    Spouse name: Not on file  . Number of children: 2  . Years of education: Not on file  . Highest education level: Not on file  Occupational History  . 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 Financial resource strain: Not on file  . Food insecurity:    Worry: Not on file    Inability: Not on file  .  Transportation needs:    Medical: Not on file    Non-medical: Not on file  Tobacco Use  . Smoking status: Former Smoker    Packs/day: 2.00    Years: 45.00    Pack years: 90.00    Types: Cigarettes    Last attempt to quit: 03/22/2017    Years since quitting: 0.8  . Smokeless tobacco: Never Used  . Tobacco comment: 2 ppd , quit 03/2017  Substance and Sexual Activity  . Alcohol use: Yes    Alcohol/week: 0.0 oz    Comment: 5 cans of beer daily  . Drug use: No  . Sexual activity: Never  Lifestyle  . Physical activity:    Days per week: Not on file    Minutes per session: Not on file  . Stress: Not on file  Relationships  . Social connections:    Talks on phone: Not on file    Gets together: Not on file    Attends religious service: Not on file    Active member of club or organization: Not on file     Attends meetings of clubs or organizations: Not on file    Relationship status: Not on file  . Intimate partner violence:    Fear of current or ex partner: Not on file    Emotionally abused: Not on file    Physically abused: Not on file    Forced sexual activity: Not on file  Other Topics Concern  . Not on file  Social History Narrative   Lives w/ wife    ALLERGIES:    Allergies  Allergen Reactions  . Simvastatin Other (See Comments)    Mental status changes  . Celecoxib Other (See Comments)    GI UPSET AND INFLAMMATION  . Codeine Other (See Comments)    GI UPSET AND INFLAMMATION  . Nsaids Other (See Comments)    GI UPSET AND INFLAMMATION (can tolerate via IV)  . Tape Other (See Comments)    Medical tape and Band-Aids PULL OFF THE SKIN; please use Coban wrap  . Cephalexin Itching and Rash  . Latex Rash    CURRENT MEDICATIONS:    Current Outpatient Medications  Medication Sig Dispense Refill  . albuterol (PROAIR HFA) 108 (90 Base) MCG/ACT inhaler Inhale two puffs every four to six hours as needed for cough or wheeze. (Patient taking differently: Inhale 2 puffs into the lungs See admin instructions. Every 4-6 hours as needed for coughing or wheezing) 3 Inhaler 0  . albuterol (PROVENTIL) (2.5 MG/3ML) 0.083% nebulizer solution Take 3 mLs (2.5 mg total) by nebulization every 8 (eight) hours as needed for wheezing or shortness of breath. 150 mL 1  . ALPRAZolam (XANAX) 1 MG tablet Take 1-1.5 tablets (1-1.5 mg total) by mouth at bedtime. 40 tablet 3  . aspirin EC 81 MG tablet Take 1 tablet (81 mg total) by mouth daily.    Marland Kitchen augmented betamethasone dipropionate (DIPROLENE-AF) 0.05 % cream Apply topically 2 (two) times daily as needed. 100 g 3  . azelastine (ASTELIN) 0.1 % nasal spray Can use two sprays in each nostril twice daily as directed. (Patient taking differently: Place 2 sprays into both nostrils 2 (two) times daily. ) 90 mL 3  . escitalopram (LEXAPRO) 10 MG tablet Take 1  tablet (10 mg total) by mouth daily. 30 tablet 5  . esomeprazole (NEXIUM) 40 MG capsule Take 1 capsule (40 mg total) by mouth daily before breakfast. 90 capsule 3  . flecainide (TAMBOCOR) 100 MG  tablet Take 1 tablet (100 mg total) by mouth 2 (two) times daily. 180 tablet 3  . fluticasone (FLONASE) 50 MCG/ACT nasal spray Use one spray in each nostril twice daily. 48 g 3  . fluticasone furoate-vilanterol (BREO ELLIPTA) 200-25 MCG/INH AEPB Inhale 1 puff into the lungs daily. 180 each 3  . furosemide (LASIX) 40 MG tablet Take 1 tablet (40 mg total) by mouth daily. 30 tablet 5  . metoprolol tartrate (LOPRESSOR) 25 MG tablet Take 2 tablets (50 mg total) by mouth 2 (two) times daily. 180 tablet 3  . potassium chloride (K-DUR) 10 MEQ tablet Take 1 tablet (10 mEq total) by mouth daily. 30 tablet 5  . umeclidinium bromide (INCRUSE ELLIPTA) 62.5 MCG/INH AEPB Inhale 1 puff into the lungs daily.     No current facility-administered medications for this visit.     REVIEW OF SYSTEMS:   [X]  denotes positive finding, [ ]  denotes negative finding Cardiac  Comments:  Chest pain or chest pressure:    Shortness of breath upon exertion: x   Short of breath when lying flat:    Irregular heart rhythm: x       Vascular    Pain in calf, thigh, or hip brought on by ambulation:    Pain in feet at night that wakes you up from your sleep:     Blood clot in your veins:    Leg swelling:  x       Pulmonary    Oxygen at home:    Productive cough:  x   Wheezing:         Neurologic    Sudden weakness in arms or legs:     Sudden numbness in arms or legs:     Sudden onset of difficulty speaking or slurred speech:    Temporary loss of vision in one eye:     Problems with dizziness:         Gastrointestinal    Blood in stool:      Vomited blood:         Genitourinary    Burning when urinating:     Blood in urine:        Psychiatric    Major depression:         Hematologic    Bleeding problems:      Problems with blood clotting too easily:        Skin    Rashes or ulcers:        Constitutional    Fever or chills:     PHYSICAL EXAM:   Vitals:   01/10/18 1318 01/10/18 1320  BP: (!) 144/69 (!) 146/67  Pulse: (!) 48   Resp: 18   Temp: 97.6 F (36.4 C)   TempSrc: Oral   SpO2: 96%   Weight: 180 lb 12.8 oz (82 kg)   Height: 6\' 1"  (1.854 m)     GENERAL: The patient is a well-nourished male, in no acute distress. The vital signs are documented above. CARDIAC: There is a regular rate and rhythm.  VASCULAR: Bilateral lower extremity edema.  Nonpalpable pedal pulses.  No carotid bruits. PULMONARY: Nonlabored respirations MUSCULOSKELETAL: There are no major deformities or cyanosis. NEUROLOGIC: No focal weakness or paresthesias are detected. SKIN: There are no ulcers or rashes noted. PSYCHIATRIC: The patient has a normal affect.  STUDIES:   I have reviewed the CT scan with the following findings:  Redemonstration of surgical repair of type A dissection with aortic root replacement, complete  arch replacement with elephant trunk, and arch de-branching. Similar extent of the descending component of the dissection flap.  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 The diameter of the aorta at the hiatus measures 3.9 cm, which is larger than the baseline study when the diameter measured approximately 3.2 cm. There appear to be 2 source of perfusion into the false channel, cranially from retrograde intercostal artery flow, and caudally from retrograde flow through the dissection flap fenestrations.  These results were discussed by telephone at the time of interpretation on 11/22/2017 at 4:59 pm with Dr. Darylene Price.  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  Bilateral pleural effusions with associated atelectasis.  Centrilobular and paraseptal emphysema.  Emphysema (ICD10-J43.9).  Hepatomegaly ASSESSMENT and PLAN   Descending aortic aneurysm secondary to type a dissection: The patient's maximum diameter is now 5.5 cm.  This has increased since his previous scan.  Today we discussed the possibility of repair.  I think he would be a candidate for percutaneous endovascular repair landing the stent graft into the elephant trunk.  We discussed that this would carry the risk of paralysis, intestinal ischemia, renal dysfunction, lower extremity arterial insufficiency, access site issues.  The patient and his family would like to have this repaired as they are concerned about the possibility of rupture.  He is scheduled to see pulmonary medicine on June 18.  From my perspective his shortness of breath is much better than what is described recently.  I would like to repeat a CT scan to better evaluate his lungs and is well asked to see if the aneurysm is changed in size.  I will get this with 1 mm cuts to help facilitate preoperative planning.  I am going to have him return to the office in 4 to 6 weeks with a carotid duplex and ABIs as well.   Annamarie Major, MD [facility] [phone] Pager [phone]

## 2018-01-11 ENCOUNTER — Other Ambulatory Visit: Payer: Self-pay

## 2018-01-11 DIAGNOSIS — I712 Thoracic aortic aneurysm, without rupture, unspecified: Secondary | ICD-10-CM

## 2018-01-11 DIAGNOSIS — I6529 Occlusion and stenosis of unspecified carotid artery: Secondary | ICD-10-CM

## 2018-01-11 DIAGNOSIS — I739 Peripheral vascular disease, unspecified: Secondary | ICD-10-CM

## 2018-01-14 ENCOUNTER — Other Ambulatory Visit: Payer: Self-pay | Admitting: Allergy and Immunology

## 2018-01-14 ENCOUNTER — Ambulatory Visit: Payer: Medicare Other | Admitting: Internal Medicine

## 2018-01-17 ENCOUNTER — Encounter: Payer: Medicare Other | Admitting: Surgery

## 2018-01-17 ENCOUNTER — Telehealth: Payer: Self-pay | Admitting: Internal Medicine

## 2018-01-17 NOTE — Telephone Encounter (Signed)
Wife request a handicap parking sticker, I think that is very reasonable.  Please mail him the paperwork

## 2018-01-18 NOTE — Telephone Encounter (Signed)
PAPERWORK MAILED OUT TO PATIENT YESTERDAY

## 2018-01-25 ENCOUNTER — Ambulatory Visit (INDEPENDENT_AMBULATORY_CARE_PROVIDER_SITE_OTHER): Payer: Medicare Other | Admitting: Pulmonary Disease

## 2018-01-25 ENCOUNTER — Encounter: Payer: Self-pay | Admitting: Pulmonary Disease

## 2018-01-25 ENCOUNTER — Ambulatory Visit (INDEPENDENT_AMBULATORY_CARE_PROVIDER_SITE_OTHER)
Admission: RE | Admit: 2018-01-25 | Discharge: 2018-01-25 | Disposition: A | Discharge status: Home / Self Care | Payer: Medicare Other | Source: Ambulatory Visit | Attending: Pulmonary Disease | Admitting: Pulmonary Disease

## 2018-01-25 ENCOUNTER — Other Ambulatory Visit (INDEPENDENT_AMBULATORY_CARE_PROVIDER_SITE_OTHER): Payer: Medicare Other

## 2018-01-25 VITALS — BP 142/78 | HR 76 | Ht 72.0 in | Wt 180.0 lb

## 2018-01-25 DIAGNOSIS — J439 Emphysema, unspecified: Secondary | ICD-10-CM

## 2018-01-25 DIAGNOSIS — I6529 Occlusion and stenosis of unspecified carotid artery: Secondary | ICD-10-CM

## 2018-01-25 DIAGNOSIS — J90 Pleural effusion, not elsewhere classified: Secondary | ICD-10-CM | POA: Diagnosis not present

## 2018-01-25 LAB — PULMONARY FUNCTION TEST
DL/VA % pred: 52 %
DL/VA: 2.46 ml/min/mmHg/L
DLCO UNC % PRED: 32 %
DLCO unc: 10.89 ml/min/mmHg
FEF 25-75 PRE: 0.95 L/s
FEF 25-75 Post: 1.23 L/sec
FEF2575-%Change-Post: 29 %
FEF2575-%Pred-Post: 46 %
FEF2575-%Pred-Pre: 35 %
FEV1-%Change-Post: 6 %
FEV1-%PRED-POST: 57 %
FEV1-%PRED-PRE: 54 %
FEV1-POST: 1.98 L
FEV1-PRE: 1.87 L
FEV1FVC-%CHANGE-POST: 2 %
FEV1FVC-%Pred-Pre: 89 %
FEV6-%CHANGE-POST: 3 %
FEV6-%PRED-PRE: 62 %
FEV6-%Pred-Post: 65 %
FEV6-PRE: 2.77 L
FEV6-Post: 2.87 L
FEV6FVC-%Change-Post: 0 %
FEV6FVC-%PRED-PRE: 103 %
FEV6FVC-%Pred-Post: 103 %
FVC-%CHANGE-POST: 3 %
FVC-%Pred-Post: 62 %
FVC-%Pred-Pre: 60 %
FVC-POST: 2.92 L
FVC-Pre: 2.81 L
POST FEV6/FVC RATIO: 98 %
Post FEV1/FVC ratio: 68 %
Pre FEV1/FVC ratio: 66 %
Pre FEV6/FVC Ratio: 98 %
RV % PRED: 86 %
RV: 2.14 L
TLC % pred: 66 %
TLC: 4.83 L

## 2018-01-25 LAB — CBC WITH DIFFERENTIAL/PLATELET
BASOS ABS: 0 10*3/uL (ref 0.0–0.1)
Basophils Relative: 0.5 % (ref 0.0–3.0)
EOS ABS: 0.3 10*3/uL (ref 0.0–0.7)
Eosinophils Relative: 4.8 % (ref 0.0–5.0)
HCT: 35.9 % — ABNORMAL LOW (ref 39.0–52.0)
Hemoglobin: 11.7 g/dL — ABNORMAL LOW (ref 13.0–17.0)
LYMPHS ABS: 1.1 10*3/uL (ref 0.7–4.0)
Lymphocytes Relative: 16.5 % (ref 12.0–46.0)
MCHC: 32.5 g/dL (ref 30.0–36.0)
MCV: 78 fl (ref 78.0–100.0)
MONO ABS: 1.1 10*3/uL — AB (ref 0.1–1.0)
Monocytes Relative: 16.1 % — ABNORMAL HIGH (ref 3.0–12.0)
NEUTROS PCT: 62.1 % (ref 43.0–77.0)
Neutro Abs: 4.1 10*3/uL (ref 1.4–7.7)
Platelets: 274 10*3/uL (ref 150.0–400.0)
RBC: 4.61 Mil/uL (ref 4.22–5.81)
RDW: 17.3 % — ABNORMAL HIGH (ref 11.5–15.5)
WBC: 6.7 10*3/uL (ref 4.0–10.5)

## 2018-01-25 NOTE — Patient Instructions (Signed)
I have reviewed the lung function test which shows moderate-severe emphysema Continue your inhalers We will check CBC with differential, blood allergy profile, alpha-1 antitrypsin levels and phenotype Get chest x-ray for reevaluation of your pleural effusion We will check oxygen levels on exertion Follow-up at the end of July to make sure you are doing okay before your planned surgery

## 2018-01-25 NOTE — Progress Notes (Signed)
PFT completed today. 01/25/18  

## 2018-01-25 NOTE — Progress Notes (Signed)
Sean Riley    [id number]    1951/06/21  Primary Care Physician:Paz, Alda Berthold, MD  Referring Physician: Colon Branch, MD [address] [place], [place] [address]  Chief complaint: Follow-up for COPD  HPI: 67 year old heavy smoker with history of ascending aortic dissection status post repair, aortic wall replacement on 03/24/17 Followed with Dr. Roxy Manns.  CT scan in January showed large bilateral effusion.  He has been placed on Lasix with improvement in effusion States that his breathing continues to be poor post surgery with no improvement with diuresis Had a COPD exacerbation earlier this year, was exposed to the flu from his son treated with Zithromax and Tamiflu Given Symbicort which is not using.  He is using breo which is his wife's inhaler and incruse, nebulizers which help with her breathing.  Pets: Dog, no birds, [place]. Occupation: Retired Museum/gallery curator. Exposures: No known exposures, no mold, he has a hot tub but does not use it Smoking history: 90-pack-year smoking history.  Quit in 2018 Travel History: Not significant  Interim history: Mr. Maute is here for review of PFTs.  Complains of productive cough with clear to yellow mucus mostly in the morning.  Has chronic dyspnea with activity.  No wheezing, hemoptysis, fevers, chills.  He has followed up with Dr. Ricard Dillon and underwent thoracentesis of the right pleural effusion on 4/18.  I do not see any pleural studies sent from the procedure.  He is also being evaluated by Dr. Trula Slade for descending aortic aneurysm and possible endovascular repair.  Outpatient Encounter Medications as of 01/25/2018  Medication Sig  . albuterol (PROAIR HFA) 108 (90 Base) MCG/ACT inhaler Inhale two puffs every four to six hours as needed for cough or wheeze. (Patient taking differently: Inhale 2 puffs into the lungs See admin instructions. Every 4-6 hours as needed for coughing or wheezing)  .  albuterol (PROVENTIL) (2.5 MG/3ML) 0.083% nebulizer solution Take 3 mLs (2.5 mg total) by nebulization every 8 (eight) hours as needed for wheezing or shortness of breath.  . ALPRAZolam (XANAX) 1 MG tablet Take 1-1.5 tablets (1-1.5 mg total) by mouth at bedtime.  Marland Kitchen aspirin EC 81 MG tablet Take 1 tablet (81 mg total) by mouth daily.  Marland Kitchen augmented betamethasone dipropionate (DIPROLENE-AF) 0.05 % cream Apply topically 2 (two) times daily as needed.  Marland Kitchen azelastine (ASTELIN) 0.1 % nasal spray Can use two sprays in each nostril twice daily as directed. (Patient taking differently: Place 2 sprays into both nostrils 2 (two) times daily. )  . escitalopram (LEXAPRO) 10 MG tablet Take 1 tablet (10 mg total) by mouth daily.  Marland Kitchen esomeprazole (NEXIUM) 40 MG capsule Take 1 capsule (40 mg total) by mouth daily before breakfast.  . flecainide (TAMBOCOR) 100 MG tablet Take 1 tablet (100 mg total) by mouth 2 (two) times daily.  . fluticasone (FLONASE) 50 MCG/ACT nasal spray Use one spray in each nostril twice daily.  . fluticasone furoate-vilanterol (BREO ELLIPTA) 200-25 MCG/INH AEPB Inhale 1 puff into the lungs daily.  . furosemide (LASIX) 40 MG tablet Take 1 tablet (40 mg total) by mouth daily.  . metoprolol tartrate (LOPRESSOR) 25 MG tablet Take 2 tablets (50 mg total) by mouth 2 (two) times daily.  . potassium chloride (K-DUR) 10 MEQ tablet Take 1 tablet (10 mEq total) by mouth daily.  Marland Kitchen umeclidinium bromide (INCRUSE ELLIPTA) 62.5 MCG/INH AEPB Inhale 1 puff into the lungs daily.  No facility-administered encounter medications on file as of 01/25/2018.     Allergies as of 01/25/2018 - Review Complete 01/25/2018  Allergen Reaction Noted  . Simvastatin Other (See Comments)   . Celecoxib Other (See Comments)   . Codeine Other (See Comments)   . Nsaids Other (See Comments)   . Tape Other (See Comments) 03/23/2017  . Cephalexin Itching and Rash   . Latex Rash 03/23/2017    Past Medical History:  Diagnosis Date   . Allergy   . Arthritis   . Ascending aortic dissection ([place]) 03/23/2017  . Atrial fibrillation ([place])   . Cataract    removed both eyes   . GERD (gastroesophageal reflux disease)   . HYPERLIPIDEMIA   . 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 Hypertension   . Microscopic hematuria    negative cystoscopy  . NONSPECIFIC ABNORMAL ELECTROCARDIOGRAM   . Pleural effusion on right   . Pleural effusion, bilateral   . S/P aortic dissection repair 03/24/2017   Biological Bentall aortic root replacement + resection and grafting of entire ascending aorta, transverse aortic arch and proximal descending thoracic aorta with elephant trunk distal anastomosis and debranching of aortic arch vessels  . 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 Varicose veins     Past Surgical History:  Procedure Laterality Date  . APPENDECTOMY    . CARDIAC CATHETERIZATION      X3;Dr Caryl Comes, last in July, 2013  . COLONOSCOPY    . CORONARY ANGIOPLASTY     04/24/13 : he denies angioplasty  . CYSTOSCOPY  1978   Dr Hartley Barefoot  . IR THORACENTESIS ASP PLEURAL SPACE W/IMG GUIDE  11/25/2017  . KNEE ARTHROSCOPY  2012   Dr Theda Sers  . LUMBAR LAMINECTOMY  1987   Dr Durward Fortes  . MYELOGRAM  2007  . PERICARDIOCENTESIS N/A 04/07/2017   Procedure: PERICARDIOCENTESIS;  Surgeon: Sherren Mocha, MD;  Location: [facility];  Service: Cardiovascular;  Laterality: N/A;  . POLYPECTOMY    . REPAIR OF ACUTE ASCENDING THORACIC AORTIC DISSECTION N/A 03/23/2017   Procedure: REPAIR OF ACUTE ASCENDING THORACIC AORTIC DISSECTION.  Bentall procedure.  Aortic root repleacement with bioprosthetic valve.  Reimplantation of left and right coronary arteries.  Total resection of transverse aortic arch.  Elephant trunk distal anastomosis and  debranching of arch vessels.;  Surgeon: Rexene Alberts, MD;  Location: [facility];  Service: Vascular;  Laterality: N/A;  . ROTATOR CUFF REPAIR    . SHOULDER ARTHROSCOPY  2011   Dr Theda Sers  . SHOULDER ARTHROSCOPY Right 08/2015  . SPINAL FUSION  1986   Dr Rolin Barry  . TEE WITHOUT CARDIOVERSION N/A 03/23/2017   Procedure: TRANSESOPHAGEAL ECHOCARDIOGRAM (TEE);  Surgeon: Rexene Alberts, MD;  Location: [facility];  Service: Open Heart Surgery;  Laterality: N/A;  . TRACHEOSTOMY     age 75 for croup    Family History  Problem Relation Age of Onset  . Hypertension Mother   . Hypertension Father   . Benign prostatic hyperplasia Father        S/P TURP  . Heart attack Maternal Grandmother        MI in 51s  . Breast cancer Maternal Grandmother   . Arrhythmia Brother         X 2  .  Heart attack Maternal Aunt        MI in 57s  . Stroke Neg Hx   . Diabetes Neg Hx   . Colon cancer Neg Hx   . Colon polyps Neg Hx   . Stomach cancer Neg Hx   . Rectal cancer Neg Hx   . Esophageal cancer Neg Hx   . Prostate cancer Neg Hx     Social History   Socioeconomic History  . Marital status: Married    Spouse name: Not on file  . Number of children: 2  . Years of education: Not on file  . Highest education level: Not on file  Occupational History  . 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 Financial resource strain: Not on file  . Food insecurity:    Worry: Not on file    Inability: Not on file  . Transportation needs:    Medical: Not on file    Non-medical: Not on file  Tobacco Use  . Smoking status: Former Smoker    Packs/day: 2.00    Years: 45.00    Pack years: 90.00    Types: Cigarettes    Last attempt to quit: 03/22/2017    Years since quitting: 0.8  . Smokeless tobacco: Never Used  . Tobacco comment: 2 ppd , quit 03/2017  Substance and Sexual Activity  . Alcohol use: Yes    Alcohol/week: 0.0 oz    Comment: 5 cans of beer daily  . Drug use: No  . Sexual activity: Never    Lifestyle  . Physical activity:    Days per week: Not on file    Minutes per session: Not on file  . Stress: Not on file  Relationships  . Social connections:    Talks on phone: Not on file    Gets together: Not on file    Attends religious service: Not on file    Active member of club or organization: Not on file    Attends meetings of clubs or organizations: Not on file    Relationship status: Not on file  . Intimate partner violence:    Fear of current or ex partner: Not on file    Emotionally abused: Not on file    Physically abused: Not on file    Forced sexual activity: Not on file  Other Topics Concern  . Not on file  Social History Narrative   Lives w/ wife    Review of systems: Review of Systems  Constitutional: Negative for fever and chills.  HENT: Negative.   Eyes: Negative for blurred vision.  Respiratory: as per HPI  Cardiovascular: Negative for chest pain and palpitations.  Gastrointestinal: Negative for vomiting, diarrhea, blood per rectum. Genitourinary: Negative for dysuria, urgency, frequency and hematuria.  Musculoskeletal: Negative for myalgias, back pain and joint pain.  Skin: Negative for itching and rash.  Neurological: Negative for dizziness, tremors, focal weakness, seizures and loss of consciousness.  Endo/Heme/Allergies: Negative for environmental allergies.  Psychiatric/Behavioral: Negative for depression, suicidal ideas and hallucinations.  All other systems reviewed and are negative.  Physical Exam: Blood pressure (!) 142/78, pulse 76, height 6' (1.829 m), weight 180 lb (81.6 kg), SpO2 92 %. Gen:      No acute distress HEENT:  EOMI, sclera anicteric Neck:     No masses; no thyromegaly Lungs:    Clear to auscultation bilaterally; normal respiratory effort CV:         Regular rate and rhythm; no murmurs  Abd:      + bowel sounds; soft, non-tender; no palpable masses, no distension Ext:    No edema; adequate peripheral perfusion Skin:       Warm and dry; no rash Neuro: alert and oriented x 3 Psych: normal mood and affect  Data Reviewed: PFTs 01/05/17-FVC 4.02 [80%], FEV1 3.03 [81%], F/F 75 12/24/15-FVC 4.27 [91%], FEV1 2.39 [86%], F/F 79 Normal spirometry  12/25/13 FVC 4.96 [103%], FEV1 3.51 [97%], F/F 71, p.o. 314%, DLCO 72% Minimal obstruction, small airway disease, minimal diffusion defect  01/25/2018 FVC 2.92 (62%], FEV1 1.98 [57%], F/F 68, TLC 66%, DLCO 32% Moderate-severe obstruction, severe restriction and diffusion defect.  Imaging CT 03/22/17- moderate emphysema, left upper lobe 3.6 mm nodule. CT angio 08/23/17-emphysema, large-moderate right and left effusion. Chest x-ray 09/20/17- improved size of pleural effusion.  Minimal basilar atelectasis, hyperinflation. I have reviewed the images personally.  Assessment:  COPD PFTs reviewed with moderate-severe obstruction Continue on Breo and incruse  His COPD appears stable today he is on optimal inhaler therapy. Oxygen levels remained stable on exertion. We will reassess him at the end of July to make sure that his symptoms are under control in case he is up needing endovascular repair of aortic dissection by Dr. Trula Slade.  Check CBC differential, blood allergy profile, alpha-1 antitrypsin levels and phenotype Chest x-ray today for evaluation of pleural effusion  Health maintenance Pneumovax 10/16/13, Prevnar 05/10/14 Flu vaccine 05/18/17  Plan/Recommendations: - Continue Breo, Incruse - X-ray, CBC differential, blood allergy profile, alpha-1 antitrypsin levels and phenotype  [name] MD [facility] 01/25/2018, 11:07 AM  CC: Colon Branch, MD

## 2018-01-29 LAB — RESPIRATORY ALLERGY PROFILE REGION II ~~LOC~~
ALLERGEN, CEDAR TREE, T6: 0.11 kU/L — AB
ALLERGEN, COTTONWOOD, T14: 0.16 kU/L — AB
ALLERGEN, OAK, T7: 0.11 kU/L — AB
Allergen, A. alternata, m6: <0.1 kU/L
Allergen, Comm Silver Birch, t9: <0.1 kU/L
Allergen, D pternoyssinus,d7: <0.1 kU/L
Allergen, Mouse Urine Protein, e78: <0.1 kU/L
Allergen, Mulberry, t76: <0.1 kU/L
Allergen, P. notatum, m1: <0.1 kU/L
Aspergillus fumigatus, m3: <0.1 kU/L
BOX ELDER: 0.11 kU/L — AB
CAT DANDER: 0.29 kU/L — AB
CLASS: 0
CLASS: 0
CLASS: 0
CLASS: 0
CLASS: 0
CLASS: 0
CLASS: 0
CLASS: 0
CLASS: 1
COMMON RAGWEED (SHORT) (W1) IGE: 0.19 kU/L — ABNORMAL HIGH
Class: 0
Class: 0
Class: 0
Class: 0
Class: 0
Class: 0
Class: 0
Class: 0
Class: 0
Class: 0
Class: 0
Class: 0
Class: 0
Class: 0
Class: 0
D. farinae: <0.1 kU/L
DOG DANDER: 0.42 kU/L — AB
Elm IgE: 0.13 kU/L — ABNORMAL HIGH
IGE (IMMUNOGLOBULIN E), SERUM: 841 kU/L — AB (ref <=114)
JOHNSON GRASS: 0.12 kU/L — AB
Pecan/Hickory Tree IgE: <0.1 kU/L
Rough Pigweed  IgE: <0.1 kU/L
Sheep Sorrel IgE: <0.1 kU/L
Timothy Grass: 0.14 kU/L — ABNORMAL HIGH

## 2018-01-29 LAB — ALPHA-1 ANTITRYPSIN PHENOTYPE: A-1 Antitrypsin, Ser: 166 mg/dL (ref 83–199)

## 2018-01-29 LAB — INTERPRETATION:

## 2018-02-11 ENCOUNTER — Ambulatory Visit
Admission: RE | Admit: 2018-02-11 | Discharge: 2018-02-11 | Disposition: A | Discharge status: Home / Self Care | Payer: Medicare Other | Source: Ambulatory Visit | Attending: Surgery | Admitting: Surgery

## 2018-02-11 DIAGNOSIS — I712 Thoracic aortic aneurysm, without rupture, unspecified: Secondary | ICD-10-CM

## 2018-02-11 MED ORDER — IOPAMIDOL (ISOVUE-370) INJECTION 76%
100.0000 mL | Freq: Once | INTRAVENOUS | Status: AC | PRN
  Administered 2018-02-11: 100 mL via INTRAVENOUS

## 2018-02-14 ENCOUNTER — Ambulatory Visit (HOSPITAL_COMMUNITY)
Admission: RE | Admit: 2018-02-14 | Discharge: 2018-02-14 | Disposition: A | Discharge status: Home / Self Care | Payer: Medicare Other | Source: Ambulatory Visit | Attending: Surgery | Admitting: Surgery

## 2018-02-14 ENCOUNTER — Ambulatory Visit (INDEPENDENT_AMBULATORY_CARE_PROVIDER_SITE_OTHER): Payer: Medicare Other | Admitting: Surgery

## 2018-02-14 ENCOUNTER — Encounter: Payer: Self-pay | Admitting: Surgery

## 2018-02-14 ENCOUNTER — Other Ambulatory Visit: Payer: Self-pay

## 2018-02-14 ENCOUNTER — Ambulatory Visit (INDEPENDENT_AMBULATORY_CARE_PROVIDER_SITE_OTHER)
Admission: RE | Admit: 2018-02-14 | Discharge: 2018-02-14 | Disposition: A | Discharge status: Home / Self Care | Payer: Medicare Other | Source: Ambulatory Visit | Attending: Surgery | Admitting: Surgery

## 2018-02-14 VITALS — BP 135/69 | HR 46 | Temp 97.6°F | Resp 18 | Ht 72.0 in | Wt 178.0 lb

## 2018-02-14 DIAGNOSIS — I10 Essential (primary) hypertension: Secondary | ICD-10-CM | POA: Insufficient documentation

## 2018-02-14 DIAGNOSIS — I712 Thoracic aortic aneurysm, without rupture, unspecified: Secondary | ICD-10-CM

## 2018-02-14 DIAGNOSIS — I6529 Occlusion and stenosis of unspecified carotid artery: Secondary | ICD-10-CM

## 2018-02-14 DIAGNOSIS — I739 Peripheral vascular disease, unspecified: Secondary | ICD-10-CM | POA: Diagnosis not present

## 2018-02-14 DIAGNOSIS — Z8249 Family history of ischemic heart disease and other diseases of the circulatory system: Secondary | ICD-10-CM | POA: Insufficient documentation

## 2018-02-14 DIAGNOSIS — E785 Hyperlipidemia, unspecified: Secondary | ICD-10-CM | POA: Insufficient documentation

## 2018-02-14 NOTE — Progress Notes (Signed)
Vascular and Vein Specialist of [facility]  Patient name: Sean Riley MRN: [id number] DOB: 11/16/1950 Sex: male   REASON FOR VISIT:    Follow  Up TAAA  HISOTRY OF PRESENT ILLNESS:    Sean Riley is a 67 y.o. 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  He underwent complete aortic arch replacement with elephant trunk as well as bioprosthetic tissue valve.  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  He is a former smoker  Patient is medically managed for hypertension as well as bradycardia.    He was recently evaluated by pulmonary medicine who identified moderate-severe emphysema.   PAST MEDICAL HISTORY:   Past Medical History:  Diagnosis Date  . Allergy   . Arthritis   . 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 Atrial fibrillation ([place])   . Cataract    removed both eyes   . GERD (gastroesophageal reflux disease)   . HYPERLIPIDEMIA   . 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 Hypertension   . Microscopic hematuria    negative cystoscopy  . NONSPECIFIC ABNORMAL ELECTROCARDIOGRAM   . Pleural effusion on right   . Pleural effusion, bilateral   . S/P aortic dissection repair 03/24/2017   Biological Bentall aortic root replacement + resection and grafting of entire ascending aorta, transverse aortic arch and proximal descending thoracic aorta with elephant trunk distal anastomosis and debranching of aortic arch vessels  . 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 Varicose veins      FAMILY HISTORY:   Family History  Problem Relation Age of Onset  . Hypertension Mother   . Hypertension Father   . Benign  prostatic hyperplasia Father        S/P TURP  . Heart attack Maternal Grandmother        MI in 81s  . Breast cancer Maternal Grandmother   . Arrhythmia Brother         X 2  . Heart attack Maternal Aunt        MI in 71s  . Stroke Neg Hx   . Diabetes Neg Hx   . Colon cancer Neg Hx   . Colon polyps Neg Hx   . Stomach cancer Neg Hx   . Rectal cancer Neg Hx   . Esophageal cancer Neg Hx   . Prostate cancer Neg Hx     SOCIAL HISTORY:   Social History   Tobacco Use  . Smoking status: Former Smoker    Packs/day: 2.00    Years: 45.00    Pack years: 90.00    Types: Cigarettes    Last attempt to quit: 03/22/2017    Years since quitting: 0.9  . Smokeless tobacco: Never Used  . Tobacco comment: 2 ppd , quit 03/2017  Substance Use Topics  . Alcohol use: Yes    Alcohol/week: 0.0 oz    Comment: 5 cans of beer daily     ALLERGIES:   Allergies  Allergen Reactions  . Simvastatin Other (See Comments)    Mental status changes  . Celecoxib Other (See Comments)    GI UPSET AND INFLAMMATION  . Codeine Other (See Comments)    GI UPSET AND INFLAMMATION  .  Nsaids Other (See Comments)    GI UPSET AND INFLAMMATION (can tolerate via IV)  . Tape Other (See Comments)    Medical tape and Band-Aids PULL OFF THE SKIN; please use Coban wrap  . Cephalexin Itching and Rash  . Latex Rash     CURRENT MEDICATIONS:   Current Outpatient Medications  Medication Sig Dispense Refill  . albuterol (PROAIR HFA) 108 (90 Base) MCG/ACT inhaler Inhale two puffs every four to six hours as needed for cough or wheeze. (Patient taking differently: Inhale 2 puffs into the lungs See admin instructions. Every 4-6 hours as needed for coughing or wheezing) 3 Inhaler 0  . albuterol (PROVENTIL) (2.5 MG/3ML) 0.083% nebulizer solution Take 3 mLs (2.5 mg total) by nebulization every 8 (eight) hours as needed for wheezing or shortness of breath. 150 mL 1  . ALPRAZolam (XANAX) 1 MG tablet Take 1-1.5 tablets (1-1.5 mg  total) by mouth at bedtime. 40 tablet 3  . aspirin EC 81 MG tablet Take 1 tablet (81 mg total) by mouth daily.    Marland Kitchen augmented betamethasone dipropionate (DIPROLENE-AF) 0.05 % cream Apply topically 2 (two) times daily as needed. 100 g 3  . azelastine (ASTELIN) 0.1 % nasal spray Can use two sprays in each nostril twice daily as directed. (Patient taking differently: Place 2 sprays into both nostrils 2 (two) times daily. ) 90 mL 3  . escitalopram (LEXAPRO) 10 MG tablet Take 1 tablet (10 mg total) by mouth daily. 30 tablet 5  . esomeprazole (NEXIUM) 40 MG capsule Take 1 capsule (40 mg total) by mouth daily before breakfast. 90 capsule 3  . flecainide (TAMBOCOR) 100 MG tablet Take 1 tablet (100 mg total) by mouth 2 (two) times daily. 180 tablet 3  . fluticasone (FLONASE) 50 MCG/ACT nasal spray Use one spray in each nostril twice daily. 48 g 3  . furosemide (LASIX) 40 MG tablet Take 1 tablet (40 mg total) by mouth daily. 30 tablet 5  . metoprolol tartrate (LOPRESSOR) 25 MG tablet Take 2 tablets (50 mg total) by mouth 2 (two) times daily. 180 tablet 3  . potassium chloride (K-DUR) 10 MEQ tablet Take 1 tablet (10 mEq total) by mouth daily. 30 tablet 5  . umeclidinium bromide (INCRUSE ELLIPTA) 62.5 MCG/INH AEPB Inhale 1 puff into the lungs daily.    . fluticasone furoate-vilanterol (BREO ELLIPTA) 200-25 MCG/INH AEPB Inhale 1 puff into the lungs daily. (Patient not taking: Reported on 02/14/2018) 180 each 3   No current facility-administered medications for this visit.     REVIEW OF SYSTEMS:   [X]  denotes positive finding, [ ]  denotes negative finding Cardiac  Comments:  Chest pain or chest pressure:    Shortness of breath upon exertion:    Short of breath when lying flat:    Irregular heart rhythm:        Vascular    Pain in calf, thigh, or hip brought on by ambulation:    Pain in feet at night that wakes you up from your sleep:     Blood clot in your veins:    Leg swelling:         Pulmonary     Oxygen at home:    Productive cough:     Wheezing:         Neurologic    Sudden weakness in arms or legs:     Sudden numbness in arms or legs:     Sudden onset of difficulty speaking or slurred speech:  Temporary loss of vision in one eye:     Problems with dizziness:         Gastrointestinal    Blood in stool:     Vomited blood:         Genitourinary    Burning when urinating:     Blood in urine:        Psychiatric    Major depression:         Hematologic    Bleeding problems:    Problems with blood clotting too easily:        Skin    Rashes or ulcers:        Constitutional    Fever or chills:      PHYSICAL EXAM:   Vitals:   02/14/18 1452 02/14/18 1455  BP: 126/70 135/69  Pulse: (!) 46 (!) 46  Resp: 18   Temp: 97.6 F (36.4 C)   TempSrc: Oral   SpO2: 96%   Weight: 178 lb (80.7 kg)   Height: 6' (1.829 m)     GENERAL: The patient is a well-nourished male, in no acute distress. The vital signs are documented above. CARDIAC: There is a regular rate and rhythm.  PULMONARY: Non-labored respirations ABDOMEN: Soft and non-tender with normal pitched bowel sounds.  MUSCULOSKELETAL: There are no major deformities or cyanosis. NEUROLOGIC: No focal weakness or paresthesias are detected. SKIN: There are no ulcers or rashes noted. PSYCHIATRIC: The patient has a normal affect.  STUDIES:   I have reviewed the CT scan with the following findings: 1. Maximal caliber of the proximal descending thoracic aorta at the level of residual dissection is felt to be approximately 5.1-5.2 cm on the current study. Measurement on the most recent study of 11/22/2017 May have been slightly overestimated based on obliquity of measurement. 2. There is definite increase in caliber of the mid descending thoracic aorta over time since dissection repair with maximal diameter now of 4.4 cm. 3. Maximal caliber of the proximal abdominal aorta is 4.0 cm just above the celiac  takeoff. 4. Four separate renal arteries with dissection flap extending just into the origin of the superior right renal artery. Based on the CTA, it is not felt that there is likely any significant ischemia of the right kidney.   The patient had normal carotid Doppler studies today as well as normal ABIs.  MEDICAL ISSUES:    TAAA: We discussed proceeding with endovascular repair.  We discussed the risk benefits the procedure including the risk of cardiopulmonary-pulmonary complications, groin access complications, the possibility of a retroperitoneal iliac artery exposure, and paralysis.  He understands that the aorta of the lobe of the stent may also become aneurysmal and will need to be monitored over time.  I have scheduled his operation for Wednesday, August 7.  I will consider a lumbar drain, however he has had spinal fusion and this may complicate placing a drain.  He is scheduled to see pulmonary at the end of July for final approval to proceed with surgery.   Annamarie Major, MD Vascular and Vein Specialists of [facility] [phone] Pager [phone]

## 2018-02-15 ENCOUNTER — Telehealth: Payer: Self-pay | Admitting: *Deleted

## 2018-02-15 ENCOUNTER — Other Ambulatory Visit: Payer: Self-pay | Admitting: *Deleted

## 2018-02-15 NOTE — Telephone Encounter (Signed)
Phone call to patient informed of OR date of 03/30/18 to be at [facility] cone admitting department at 6:30 am. NPO past MN night prior. Expect a call and follow the detailed instructions received from the pre-admitting department [id number], pre-op instructions about this surgery. Verbalized understanding.

## 2018-03-08 ENCOUNTER — Ambulatory Visit: Payer: Medicare Other | Admitting: Pulmonary Disease

## 2018-03-08 ENCOUNTER — Telehealth: Payer: Self-pay | Admitting: *Deleted

## 2018-03-08 NOTE — Telephone Encounter (Signed)
-----   Message from Willy Eddy, RN sent at 03/08/2018  9:26 AM EDT ----- Regarding: PULMONARY CLEARANCE Requesting Pulmonary Clearance for Surgery. Patient has appt with you 03/15/18.  TEVAR scheduled for 03/30/18 with Dr. Trula Slade and Servando Snare.

## 2018-03-15 ENCOUNTER — Ambulatory Visit (INDEPENDENT_AMBULATORY_CARE_PROVIDER_SITE_OTHER)
Admission: RE | Admit: 2018-03-15 | Discharge: 2018-03-15 | Disposition: A | Discharge status: Home / Self Care | Payer: Medicare Other | Source: Ambulatory Visit | Attending: Pulmonary Disease | Admitting: Pulmonary Disease

## 2018-03-15 ENCOUNTER — Other Ambulatory Visit: Payer: Self-pay | Admitting: *Deleted

## 2018-03-15 ENCOUNTER — Encounter: Payer: Self-pay | Admitting: Pulmonary Disease

## 2018-03-15 ENCOUNTER — Ambulatory Visit (INDEPENDENT_AMBULATORY_CARE_PROVIDER_SITE_OTHER): Payer: Medicare Other | Admitting: Pulmonary Disease

## 2018-03-15 VITALS — BP 118/58 | HR 51 | Ht 72.0 in | Wt 188.0 lb

## 2018-03-15 DIAGNOSIS — J439 Emphysema, unspecified: Secondary | ICD-10-CM

## 2018-03-15 DIAGNOSIS — R05 Cough: Secondary | ICD-10-CM | POA: Diagnosis not present

## 2018-03-15 DIAGNOSIS — I6529 Occlusion and stenosis of unspecified carotid artery: Secondary | ICD-10-CM

## 2018-03-15 MED ORDER — AZITHROMYCIN 250 MG PO TABS
ORAL_TABLET | ORAL | 0 refills | Status: DC
Start: 2018-03-15 — End: 2018-04-18

## 2018-03-15 MED ORDER — PREDNISONE 10 MG PO TABS
ORAL_TABLET | ORAL | 0 refills | Status: DC
Start: 2018-03-15 — End: 2018-04-18

## 2018-03-15 NOTE — Patient Instructions (Signed)
Give you a Z-Pak and prednisone taper starting at 40 mg.  Reduce dose by 10 mg every 3 days We will get a chest x-ray today to make sure the fluid on the lungs are not worse Follow-up in 3 months.

## 2018-03-15 NOTE — Progress Notes (Signed)
Sean Riley    [id number]    23-Jul-1951  Primary Care Physician:Paz, Alda Berthold, MD  Referring Physician: Colon Branch, MD [address] [place], [place] [address]  Chief complaint: Follow-up for COPD  HPI: 67 year old heavy smoker with history of ascending aortic dissection status post repair, aortic wall replacement on 03/24/17 Followed with Dr. Roxy Manns.  CT scan in January showed large bilateral effusion.  He has been placed on Lasix with improvement in effusion States that his breathing continues to be poor post surgery with no improvement with diuresis Had a COPD exacerbation earlier this year, was exposed to the flu from his son treated with Zithromax and Tamiflu  He has followed up with Dr. Ricard Dillon and underwent thoracentesis of the right pleural effusion on 4/18.  I do not see any pleural studies sent from the procedure.  He is also being evaluated by Dr. Trula Slade for descending aortic aneurysm and possible endovascular repair.  Pets: Dog, no birds, [place]. Occupation: Retired Museum/gallery curator. Exposures: No known exposures, no mold, he has a hot tub but does not use it Smoking history: 90-pack-year smoking history.  Quit in 2018 Travel History: Not significant  Interim history: Continues on [place] and incruse.  States that for the past 4 days he is having increasing dyspnea, wheezing Associated with cough, green production.  Denies any fevers, chills.  Outpatient Encounter Medications as of 03/15/2018  Medication Sig  . albuterol (PROAIR HFA) 108 (90 Base) MCG/ACT inhaler Inhale two puffs every four to six hours as needed for cough or wheeze. (Patient taking differently: Inhale 2 puffs into the lungs See admin instructions. Every 4-6 hours as needed for coughing or wheezing)  . albuterol (PROVENTIL) (2.5 MG/3ML) 0.083% nebulizer solution Take 3 mLs (2.5 mg total) by nebulization every 8 (eight) hours as needed for wheezing or shortness of  breath.  . ALPRAZolam (XANAX) 1 MG tablet Take 1-1.5 tablets (1-1.5 mg total) by mouth at bedtime.  Marland Kitchen aspirin EC 81 MG tablet Take 1 tablet (81 mg total) by mouth daily.  Marland Kitchen augmented betamethasone dipropionate (DIPROLENE-AF) 0.05 % cream Apply topically 2 (two) times daily as needed. (Patient taking differently: Apply 1 application topically 2 (two) times daily as needed (for itchy skin). )  . azelastine (ASTELIN) 0.1 % nasal spray Can use two sprays in each nostril twice daily as directed. (Patient taking differently: Place 2 sprays into both nostrils 2 (two) times daily as needed for allergies. )  . escitalopram (LEXAPRO) 10 MG tablet Take 1 tablet (10 mg total) by mouth daily.  Marland Kitchen esomeprazole (NEXIUM) 40 MG capsule Take 1 capsule (40 mg total) by mouth daily before breakfast.  . flecainide (TAMBOCOR) 100 MG tablet Take 1 tablet (100 mg total) by mouth 2 (two) times daily.  . fluticasone (FLONASE) 50 MCG/ACT nasal spray Use one spray in each nostril twice daily. (Patient taking differently: Place 1 spray into both nostrils 2 (two) times daily as needed for allergies. Use one spray in each nostril twice daily.)  . fluticasone furoate-vilanterol (BREO ELLIPTA) 200-25 MCG/INH AEPB Inhale 1 puff into the lungs daily.  . furosemide (LASIX) 40 MG tablet Take 1 tablet (40 mg total) by mouth daily.  . metoprolol tartrate (LOPRESSOR) 25 MG tablet Take 2 tablets (50 mg total) by mouth 2 (two) times daily.  . potassium chloride (K-DUR) 10 MEQ tablet Take 1 tablet (10 mEq total) by mouth  daily.  . umeclidinium bromide (INCRUSE ELLIPTA) 62.5 MCG/INH AEPB Inhale 1 puff into the lungs daily.   No facility-administered encounter medications on file as of 03/15/2018.     Allergies as of 03/15/2018 - Review Complete 03/15/2018  Allergen Reaction Noted  . Simvastatin Other (See Comments)   . Celecoxib Other (See Comments)   . Codeine Other (See Comments)   . Nsaids Other (See Comments)   . Tape Other (See  Comments) 03/23/2017  . Cephalexin Itching and Rash   . Latex Rash 03/23/2017    Past Medical History:  Diagnosis Date  . Allergy   . Arthritis   . Ascending aortic dissection ([place]) 03/23/2017  . Atrial fibrillation ([place])   . Cataract    removed both eyes   . GERD (gastroesophageal reflux disease)   . HYPERLIPIDEMIA   . 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 Hypertension   . Microscopic hematuria    negative cystoscopy  . NONSPECIFIC ABNORMAL ELECTROCARDIOGRAM   . Pleural effusion on right   . Pleural effusion, bilateral   . S/P aortic dissection repair 03/24/2017   Biological Bentall aortic root replacement + resection and grafting of entire ascending aorta, transverse aortic arch and proximal descending thoracic aorta with elephant trunk distal anastomosis and debranching of aortic arch vessels  . 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 Varicose veins     Past Surgical History:  Procedure Laterality Date  . APPENDECTOMY    . CARDIAC CATHETERIZATION      X3;Dr Caryl Comes, last in July, 2013  . COLONOSCOPY    . CORONARY ANGIOPLASTY     04/24/13 : he denies angioplasty  . CYSTOSCOPY  1978   Dr Hartley Barefoot  . IR THORACENTESIS ASP PLEURAL SPACE W/IMG GUIDE  11/25/2017  . KNEE ARTHROSCOPY  2012   Dr Theda Sers  . LUMBAR LAMINECTOMY  1987   Dr Durward Fortes  . MYELOGRAM  2007  . PERICARDIOCENTESIS N/A 04/07/2017   Procedure: PERICARDIOCENTESIS;  Surgeon: Sherren Mocha, MD;  Location: [facility];  Service: Cardiovascular;  Laterality: N/A;  . POLYPECTOMY    . REPAIR OF ACUTE ASCENDING THORACIC AORTIC DISSECTION N/A 03/23/2017   Procedure: REPAIR OF ACUTE ASCENDING THORACIC AORTIC DISSECTION.  Bentall procedure.  Aortic root repleacement with bioprosthetic valve.   Reimplantation of left and right coronary arteries.  Total resection of transverse aortic arch.  Elephant trunk distal anastomosis and debranching of arch vessels.;  Surgeon: Rexene Alberts, MD;  Location: [facility];  Service: Vascular;  Laterality: N/A;  . ROTATOR CUFF REPAIR    . SHOULDER ARTHROSCOPY  2011   Dr Theda Sers  . SHOULDER ARTHROSCOPY Right 08/2015  . SPINAL FUSION  1986   Dr Rolin Barry  . TEE WITHOUT CARDIOVERSION N/A 03/23/2017   Procedure: TRANSESOPHAGEAL ECHOCARDIOGRAM (TEE);  Surgeon: Rexene Alberts, MD;  Location: [facility];  Service: Open Heart Surgery;  Laterality: N/A;  . TRACHEOSTOMY     age 58 for croup    Family History  Problem Relation Age of Onset  . Hypertension Mother   . Hypertension Father   . Benign prostatic hyperplasia Father        S/P TURP  . Heart attack Maternal Grandmother        MI in 9s  . Breast cancer  Maternal Grandmother   . Arrhythmia Brother         X 2  . Heart attack Maternal Aunt        MI in 46s  . Stroke Neg Hx   . Diabetes Neg Hx   . Colon cancer Neg Hx   . Colon polyps Neg Hx   . Stomach cancer Neg Hx   . Rectal cancer Neg Hx   . Esophageal cancer Neg Hx   . Prostate cancer Neg Hx     Social History   Socioeconomic History  . Marital status: Married    Spouse name: Not on file  . Number of children: 2  . Years of education: Not on file  . Highest education level: Not on file  Occupational History  . 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 Financial resource strain: Not on file  . Food insecurity:    Worry: Not on file    Inability: Not on file  . Transportation needs:    Medical: Not on file    Non-medical: Not on file  Tobacco Use  . Smoking status: Former Smoker    Packs/day: 2.00    Years: 45.00    Pack years: 90.00    Types: Cigarettes    Last attempt to quit: 03/22/2017    Years since quitting: 0.9  . Smokeless tobacco: Never Used  . Tobacco comment: 2 ppd , quit 03/2017  Substance and Sexual  Activity  . Alcohol use: Yes    Alcohol/week: 0.0 oz    Comment: 5 cans of beer daily  . Drug use: No  . Sexual activity: Never  Lifestyle  . Physical activity:    Days per week: Not on file    Minutes per session: Not on file  . Stress: Not on file  Relationships  . Social connections:    Talks on phone: Not on file    Gets together: Not on file    Attends religious service: Not on file    Active member of club or organization: Not on file    Attends meetings of clubs or organizations: Not on file    Relationship status: Not on file  . Intimate partner violence:    Fear of current or ex partner: Not on file    Emotionally abused: Not on file    Physically abused: Not on file    Forced sexual activity: Not on file  Other Topics Concern  . Not on file  Social History Narrative   Lives w/ wife    Review of systems: Review of Systems  Constitutional: Negative for fever and chills.  HENT: Negative.   Eyes: Negative for blurred vision.  Respiratory: as per HPI  Cardiovascular: Negative for chest pain and palpitations.  Gastrointestinal: Negative for vomiting, diarrhea, blood per rectum. Genitourinary: Negative for dysuria, urgency, frequency and hematuria.  Musculoskeletal: Negative for myalgias, back pain and joint pain.  Skin: Negative for itching and rash.  Neurological: Negative for dizziness, tremors, focal weakness, seizures and loss of consciousness.  Endo/Heme/Allergies: Negative for environmental allergies.  Psychiatric/Behavioral: Negative for depression, suicidal ideas and hallucinations.  All other systems reviewed and are negative.  Physical Exam: Blood pressure (!) 118/58, pulse (!) 51, height 6' (1.829 m), weight 188 lb (85.3 kg), SpO2 91 %. Gen:      No acute distress HEENT:  EOMI, sclera anicteric Neck:     No masses; no thyromegaly Lungs:   Diminished breath sounds, no  wheeze, crackles CV:         Systolic murmur with prominent S2 Abd:      + bowel  sounds; soft, non-tender; no palpable masses, no distension Ext:    No edema; adequate peripheral perfusion Skin:      Warm and dry; no rash Neuro: alert and oriented x 3 Psych: normal mood and affect  Data Reviewed: PFTs 01/05/17-FVC 4.02 [80%], FEV1 3.03 [81%], F/F 75 12/24/15-FVC 4.27 [91%], FEV1 2.39 [86%], F/F 79 Normal spirometry  12/25/13 FVC 4.96 [103%], FEV1 3.51 [97%], F/F 71, p.o. 314%, DLCO 72% Minimal obstruction, small airway disease, minimal diffusion defect  01/25/2018 FVC 2.92 (62%], FEV1 1.98 [57%], F/F 68, TLC 66%, DLCO 32% Moderate-severe obstruction, severe restriction and diffusion defect.  Imaging CT 03/22/17- moderate emphysema, left upper lobe 3.6 mm nodule. CT angio 08/23/17-emphysema, large-moderate right and left effusion. Chest x-ray 09/20/17- improved size of pleural effusion.  Minimal basilar atelectasis, hyperinflation. I have reviewed the images personally.  Labs Blood allergy profile 01/25/2018- IgE 841, sensitive to grass, cat, dog, tree pollen CBC 01/25/2018- WBC 6.7, eos 4.8%, absolute eosinophil count 300 Alpha-1 antitrypsin 01/25/2018- 166, PI MM  Assessment:  COPD PFTs reviewed with moderate-severe obstruction Continue on Breo and incruse O2 levels are stable on exertion  Appears to have minor exacerbation today.  We will treat with Z-Pak, prednisone taper to optimize him before his planned endovascular surgery on August 26.  I believe he needs to be okay for the procedure as he is not markedly dyspneic with normal lung examination. Advised early postop mobilization, use of incentive spirometry.  Bilateral pleural effusion Likely secondary to CHF.  He continues on Lasix therapy Recheck x-ray today.  If effusions are bigger then we may need to increase Lasix diuresis him before surgery.   Health maintenance Pneumovax 10/16/13, Prevnar 05/10/14 Flu vaccine 05/18/17  Plan/Recommendations: - Continue Breo, Incruse - Chest x-ray - Z-Pak,  prednisone taper  [name] MD [facility] 03/15/2018, 1:58 PM  CC: Colon Branch, MD

## 2018-03-18 NOTE — Pre-Procedure Instructions (Signed)
Sean Riley  03/18/2018      [facility] [place], [place] [address] Phone: [phone] Fax: [phone]  [facility] [place], [place] [address] [place] [address] Phone: [phone] Fax: [phone]    Your procedure is scheduled on Wednesday, August 21st.  Report to [place] at 6:30 A.M.  Call this number if you have problems the morning of surgery:  [phone]   Remember:  Do not eat or drink after midnight.     Take these medicines the morning of surgery with A SIP OF WATER  escitalopram (LEXAPRO) 10 MG tablet esomeprazole (NEXIUM) 40 MG capsule flecainide (TAMBOCOR) 100 MG tablet metoprolol tartrate (LOPRESSOR) 25 MG tablet Take Inhalers-Bring rescue inhaler to the hospital with you on the day of surgery.   As needed:  azelastine (ASTELIN) 0.1 % nasal spray fluticasone (FLONASE) 50 MCG/ACT nasal spray  Follow your surgeon's instructions on when to stop Asprin.  If no instructions were given by your surgeon then you will need to call the office to get those instructions.    7 days prior to surgery STOP taking any Aspirin(unless otherwise instructed by your surgeon), Aleve, Naproxen, Ibuprofen, Motrin, Advil, Goody's, BC's, all herbal medications, fish oil, and all vitamins    Do not wear jewelry.  Do not wear lotions, powders, or colognes, or deodorant.  Men may shave face and neck.  Do not bring valuables to the hospital.  [facility] is not responsible for any belongings or valuables.  Contacts, dentures or bridgework may not be worn into surgery.  Leave your suitcase in the car.  After surgery it may be brought to your room.  For patients admitted to the hospital, discharge time will be determined by your treatment team.  Patients discharged the day of surgery will not be allowed to drive home.     Special instructions:   [facility]- Preparing For Surgery  Before surgery, you can play an important role. Because skin is not sterile, your skin needs to be as free of germs as possible. You can reduce the number of germs on your skin by washing with CHG (chlorahexidine gluconate) Soap before surgery.  CHG is an antiseptic cleaner which kills germs and bonds with the skin to continue killing germs even after washing.    Oral Hygiene is also important to reduce your risk of infection.  Remember - BRUSH YOUR TEETH THE MORNING OF SURGERY WITH YOUR REGULAR TOOTHPASTE  Please do not use if you have an allergy to CHG or antibacterial soaps. If your skin becomes reddened/irritated stop using the CHG.  Do not shave (including legs and underarms) for at least 48 hours prior to first CHG shower. It is OK to shave your face.  Please follow these instructions carefully.   1. Shower the NIGHT BEFORE SURGERY and the MORNING OF SURGERY with CHG.   2. If you chose to wash your hair, wash your hair first as usual with your normal shampoo.  3. After you shampoo, rinse your hair and body thoroughly to remove the shampoo.  4. Use CHG as you would any other liquid soap. You can apply CHG directly to the skin and wash gently with a scrungie or a clean washcloth.   5. Apply the CHG Soap to your body ONLY FROM THE NECK DOWN.  Do not use  on open wounds or open sores. Avoid contact with your eyes, ears, mouth and genitals (private parts). Wash Face and genitals (private parts)  with your normal soap.  6. Wash thoroughly, paying special attention to the area where your surgery will be performed.  7. Thoroughly rinse your body with warm water from the neck down.  8. DO NOT shower/wash with your normal soap after using and rinsing off the CHG Soap.  9. Pat yourself dry with a CLEAN TOWEL.  10. Wear CLEAN PAJAMAS to bed the night before surgery, wear comfortable clothes the morning of surgery  11. Place CLEAN  SHEETS on your bed the night of your first shower and DO NOT SLEEP WITH PETS.    Day of Surgery:  Do not apply any deodorants/lotions.  Please wear clean clothes to the hospital/surgery center.   Remember to brush your teeth WITH YOUR REGULAR TOOTHPASTE.    Please read over the following fact sheets that you were given.

## 2018-03-21 ENCOUNTER — Encounter (HOSPITAL_COMMUNITY): Payer: Self-pay

## 2018-03-21 ENCOUNTER — Other Ambulatory Visit: Payer: Self-pay | Admitting: Pulmonary Disease

## 2018-03-21 ENCOUNTER — Encounter (HOSPITAL_COMMUNITY)
Admission: RE | Admit: 2018-03-21 | Discharge: 2018-03-21 | Disposition: A | Discharge status: Home / Self Care | Payer: Medicare Other | Source: Ambulatory Visit | Attending: Surgery | Admitting: Surgery

## 2018-03-21 ENCOUNTER — Other Ambulatory Visit: Payer: Self-pay

## 2018-03-21 DIAGNOSIS — J189 Pneumonia, unspecified organism: Secondary | ICD-10-CM

## 2018-03-21 DIAGNOSIS — I313 Pericardial effusion (noninflammatory): Secondary | ICD-10-CM | POA: Diagnosis not present

## 2018-03-21 DIAGNOSIS — I2510 Atherosclerotic heart disease of native coronary artery without angina pectoris: Secondary | ICD-10-CM | POA: Insufficient documentation

## 2018-03-21 DIAGNOSIS — I712 Thoracic aortic aneurysm, without rupture: Secondary | ICD-10-CM | POA: Diagnosis not present

## 2018-03-21 DIAGNOSIS — Z7982 Long term (current) use of aspirin: Secondary | ICD-10-CM | POA: Diagnosis not present

## 2018-03-21 DIAGNOSIS — I4510 Unspecified right bundle-branch block: Secondary | ICD-10-CM | POA: Diagnosis not present

## 2018-03-21 DIAGNOSIS — Z87891 Personal history of nicotine dependence: Secondary | ICD-10-CM | POA: Diagnosis not present

## 2018-03-21 DIAGNOSIS — Z79899 Other long term (current) drug therapy: Secondary | ICD-10-CM | POA: Diagnosis not present

## 2018-03-21 DIAGNOSIS — K219 Gastro-esophageal reflux disease without esophagitis: Secondary | ICD-10-CM | POA: Insufficient documentation

## 2018-03-21 DIAGNOSIS — I10 Essential (primary) hypertension: Secondary | ICD-10-CM | POA: Diagnosis not present

## 2018-03-21 DIAGNOSIS — I4891 Unspecified atrial fibrillation: Secondary | ICD-10-CM | POA: Diagnosis not present

## 2018-03-21 DIAGNOSIS — J90 Pleural effusion, not elsewhere classified: Secondary | ICD-10-CM | POA: Diagnosis not present

## 2018-03-21 DIAGNOSIS — J449 Chronic obstructive pulmonary disease, unspecified: Secondary | ICD-10-CM | POA: Diagnosis not present

## 2018-03-21 DIAGNOSIS — Z01812 Encounter for preprocedural laboratory examination: Secondary | ICD-10-CM | POA: Diagnosis not present

## 2018-03-21 DIAGNOSIS — R001 Bradycardia, unspecified: Secondary | ICD-10-CM | POA: Diagnosis not present

## 2018-03-21 DIAGNOSIS — I444 Left anterior fascicular block: Secondary | ICD-10-CM | POA: Diagnosis not present

## 2018-03-21 LAB — URINALYSIS, ROUTINE W REFLEX MICROSCOPIC
Bilirubin Urine: NEGATIVE
Glucose, UA: NEGATIVE mg/dL
Hgb urine dipstick: NEGATIVE
Ketones, ur: NEGATIVE mg/dL
LEUKOCYTES UA: NEGATIVE
Nitrite: NEGATIVE
Protein, ur: NEGATIVE mg/dL
SPECIFIC GRAVITY, URINE: 1.008 (ref 1.005–1.030)
pH: 6 (ref 5.0–8.0)

## 2018-03-21 LAB — COMPREHENSIVE METABOLIC PANEL
ALBUMIN: 3.5 g/dL (ref 3.5–5.0)
ALK PHOS: 105 U/L (ref 38–126)
ALT: 16 U/L (ref 0–44)
AST: 16 U/L (ref 15–41)
Anion gap: 9 (ref 5–15)
BUN: 16 mg/dL (ref 8–23)
CO2: 25 mmol/L (ref 22–32)
Calcium: 8.8 mg/dL — ABNORMAL LOW (ref 8.9–10.3)
Chloride: 99 mmol/L (ref 98–111)
Creatinine, Ser: 0.89 mg/dL (ref 0.61–1.24)
GFR calc Af Amer: >60 mL/min (ref >60)
GFR calc non Af Amer: >60 mL/min (ref >60)
Glucose, Bld: 85 mg/dL (ref 70–99)
POTASSIUM: 4.2 mmol/L (ref 3.5–5.1)
SODIUM: 133 mmol/L — AB (ref 135–145)
Total Bilirubin: 1.1 mg/dL (ref 0.3–1.2)
Total Protein: 7 g/dL (ref 6.5–8.1)

## 2018-03-21 LAB — CBC
HCT: 35.4 % — ABNORMAL LOW (ref 39.0–52.0)
Hemoglobin: 10.8 g/dL — ABNORMAL LOW (ref 13.0–17.0)
MCH: 25.2 pg — ABNORMAL LOW (ref 26.0–34.0)
MCHC: 30.5 g/dL (ref 30.0–36.0)
MCV: 82.5 fL (ref 78.0–100.0)
Platelets: 221 10*3/uL (ref 150–400)
RBC: 4.29 MIL/uL (ref 4.22–5.81)
RDW: 16.4 % — ABNORMAL HIGH (ref 11.5–15.5)
WBC: 7.8 10*3/uL (ref 4.0–10.5)

## 2018-03-21 LAB — BLOOD GAS, ARTERIAL
Acid-Base Excess: 2.6 mmol/L — ABNORMAL HIGH (ref 0.0–2.0)
BICARBONATE: 26.2 mmol/L (ref 20.0–28.0)
DRAWN BY: 449841
FIO2: 21
O2 Saturation: 96.8 %
PATIENT TEMPERATURE: 98.6
pCO2 arterial: 36.9 mmHg (ref 32.0–48.0)
pH, Arterial: 7.465 — ABNORMAL HIGH (ref 7.350–7.450)
pO2, Arterial: 85.9 mmHg (ref 83.0–108.0)

## 2018-03-21 LAB — PROTIME-INR
INR: 1.12
Prothrombin Time: 14.3 seconds (ref 11.4–15.2)

## 2018-03-21 LAB — SURGICAL PCR SCREEN
MRSA, PCR: NEGATIVE
Staphylococcus aureus: POSITIVE — AB

## 2018-03-21 LAB — APTT: APTT: 33 s (ref 24–36)

## 2018-03-21 NOTE — Progress Notes (Signed)
Blood bank notified this RN that pt T/S resulted positive for antibodies. Will need a re-collect on DOS. Order in signed and held.   Jacqlyn Larsen, RN

## 2018-03-21 NOTE — Progress Notes (Addendum)
PCP - Kathlene November, MD Pulmonologist-Praveen Vaughan Browner, MD Cardiologist - Dr. Caryl Comes   Chest x-ray - 03/16/2018 EKG - 12/20/17 Stress Test - 2016 ECHO - 2018 Cardiac Cath -2018   Sleep Study - 20+ years ago, neg results.  Aspirin Instructions: Instructed to continue ASA per surgeons office.   Anesthesia review: Yes. For previous cardiac and pulm hx.   Labs: CBC, CMP, PT/INR, PTT, UA, ABG, T/S, PCR.   Patient denies shortness of breath, fever, cough and chest pain at PAT appointment   Patient verbalized understanding of instructions that were given to them at the PAT appointment. Patient was also instructed that they will need to review over the PAT instructions again at home before surgery.

## 2018-03-21 NOTE — Progress Notes (Signed)
Anesthesia PAT Evaluation:  Case:  [id number] Date/Time:  03/30/18 0815   Procedure:  THORACIC AORTIC ENDOVASCULAR STENT GRAFT WITH INTRAVASCULAR ULTRASOUND (N/A )   Anesthesia type:  General   Pre-op diagnosis:  THORACIC AORTIC ANEURYSM   Location:  [facility] OR ROOM 16 / [place] [facility]   Surgeon:  Serafina Mitchell, MD      DISCUSSION: Patient is a 67 year old male scheduled for the above procedure. 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 Mild CAD by 2013 cath.  Last cardiology visit 12/20/17. Last pulmonology visit 03/15/18 for preoperative evaluation. Also treated for "minor exacerbation". Patient has completed Z-pack and is finishing steroid taper. He feels back to his baseline. He does not feel sick. He reports his respiratory issues tend to be more related to chronic allergies and sinus drainage. He has a chronic intermittent productive cough. He reports compliance with his pulmonary medications. He only uses albuterol MDI PRN. He reports intermittent days where he will feel winded even at rest, but says this is also chronic since after his aortic dissection surgery. He does not require supplement home O2. He reports O2 sats at home typically run ~ 94-95%. He sleeps in a Sleep Number bed at what sounds like at least a 45 degree angle with his feel also elevated.   Patient without acute cardiopulmonary symptoms. Discussed that we would want him at baseline for surgery, so to let the appropriate provider know if any acute changes between now and surgery. However, if no new changes then it is anticipated that he can proceed as planned.  He was advised  to use his albuterol MDI on the morning of surgery along along with other meds listed on his instruction list. Discussed with anesthesiologist Adele Barthel, MD.  VS: BP 133/77   Pulse (!) 55   Temp 36.7 C   Resp 20   Ht 6' (1.829 m)   Wt 85.2 kg   SpO2 99%   BMI 25.47 kg/m  Patient is a pleasant Caucasian male in NAD. His wife and grandson accompany him. He is able to walk independently, but reports feeling generally weak his last year's surgery. No conversational dyspnea noted. Heart regular, mildly bradycardia. Grade III/VI SEM. Mild bilateral ankle edema. Lungs sounds are clear.   PROVIDERS: Colon Branch, MD is PCP. Darylene Price, MD is CT surgeon. Virl Axe, MD is EP cardiologist. Last visit 12/20/17.  [name], MD is pulmonologist. Last visit 03/15/18. Patient was having a "minor exacerbation" so started him on Z-Pack and prednisone taper to optimize him before endovascular surgery. He wrote, "I believe he needs to be okay for the procedure as he is not markedly dyspneic with normal lung examination. Advised early postop mobilization, use of incentive spirometry." He did order a repeat CXR to re-evaluate pleural effusion. Xray showed stable small bilateral pleural effusion, possible LLL pneumonia. Repeat CXR is planned in 4 weeks.    LABS: Labs reviewed: Acceptable for surgery. (all labs ordered are listed, but only abnormal results are displayed)  Labs Reviewed  CBC - Abnormal; Notable for the following components:      Result Value   Hemoglobin 10.8 (*)    HCT 35.4 (*)    MCH 25.2 (*)  RDW 16.4 (*)    All other components within normal limits  COMPREHENSIVE METABOLIC PANEL - Abnormal; Notable for the following components:   Sodium 133 (*)    Calcium 8.8 (*)    All other components within normal limits  BLOOD GAS, ARTERIAL - Abnormal; Notable for the following components:   pH, Arterial 7.465 (*)    Acid-Base Excess 2.6 (*)    All other components within normal  limits  SURGICAL PCR SCREEN  APTT  PROTIME-INR  URINALYSIS, ROUTINE W REFLEX MICROSCOPIC  TYPE AND SCREEN    OTHER: PFTs 01/25/18: POST-BD: FVC 2.92 (62%], FEV1 1.98 [57%], F/F 68, TLC 66%, DLCO 32% Moderate-severe obstruction, severe restriction and diffusion defect.   IMAGES: CXR 03/15/18: IMPRESSION: - Chronic bronchitic changes. Chronic small bilateral pleural effusions. Slightly increased lung markings in the lingula and anterior aspect of the left lower lobe may reflect pneumonia. Stable borderline cardiac enlargement. No pulmonary vascular congestion. Followup PA and lateral chest X-ray is recommended in 3-4 weeks following trial of antibiotic therapy to ensure resolution and exclude underlying malignancy. - Previous CABG and aortic valve replacement and known ascending aortic aneurysm repair. No CHF. (On 03/21/18, Dr. Vaughan Browner wrote, "Please let patient know CXR shows possible mild pneumonia on left. Check if he is feeing better after the antibiotics and prednisone. Order follow up chest x ray in 4 weeks.")  CTA chest/abd/pelvis 02/11/18: IMPRESSION: 1. Maximal caliber of the proximal descending thoracic aorta at the level of residual dissection is felt to be approximately 5.1-5.2 cm on the current study. Measurement on the most recent study of 11/22/2017 May have been slightly overestimated based on obliquity of measurement. 2. There is definite increase in caliber of the mid descending thoracic aorta over time since dissection repair with maximal diameter now of 4.4 cm. 3. Maximal caliber of the proximal abdominal aorta is 4.0 cm just above the celiac takeoff. 4. Four separate renal arteries with dissection flap extending just into the origin of the superior right renal artery. Based on the CTA, it is not felt that there is likely any significant ischemia of the right kidney.   EKG: 12/20/17 ([facility]): Marked sinus bradycardia at 49 bpm.  Possible left atrial  enlargement.  Right bundle branch block.  Left anterior fascicular block.  Bifascicular block.  Cannot rule out anterior infarct, age undetermined.   CV: Echo 04/28/17: Study Conclusions - Left ventricle: The cavity size was mildly dilated. Wall   thickness was increased in a pattern of mild LVH. Systolic   function was normal. The estimated ejection fraction was in the   range of 50% to 55%. - Aortic valve: 25 mm [facility] Ease bioprosthetic valve.   Normal appearing bioprosthetic valve with trival central AR.   Valve area (VTI): 1.27 cm^2. Valve area (Vmax): 1.33 cm^2. Valve   area (Vmean): 1.39 cm^2. - Left atrium: The atrium was mildly dilated. - Pericardium, extracardiac: A trivial pericardial effusion was   identified posterior to the heart. - Impressions: no parasternal or suprasternal images done Impressions: - no parasternal or suprasternal images done  Echo 04/08/17: Study Conclusions - Left ventricle: The cavity size was normal. There was mild   hypertrophy of the septum with otherwise mild concentric   hypertrophy. Systolic function was normal. The estimated ejection   fraction was in the range of 50% to 55%. Wall motion was normal;   there were no regional wall motion abnormalities. - Aortic valve: Transvalvular velocity was within the normal range.  There was no stenosis. There was no regurgitation. - Mitral valve: Transvalvular velocity was within the normal range.   There was no evidence for stenosis. There was no regurgitation. - Left atrium: The atrium was moderately dilated. - Right ventricle: The cavity size was normal. Wall thickness was   normal. Systolic function was normal. - Right atrium: The atrium was moderately dilated. - Tricuspid valve: There was no regurgitation. - Pericardium, extracardiac: A small to moderate pericardial   effusion was identified anterior to the heart. Measures up to   1.6cm. There was no evidence of hemodynamic compromise.  There was   a left pleural effusion.  Nuclear stress test 07/25/15:  The left ventricular ejection fraction is mildly decreased (45-54%).  Nuclear stress EF: 48%.  There was no ST segment deviation noted during stress.  This is an intermediate risk study. There is no ischemia.  There is diaphragmatic attenuation similar to prior study of 12/29/11. Since the prior study of 12/29/11, the EF has decreased from 58% to 48%.  The current study does not reveal any segmental wall motion abnormalities.  Cardiac cath 01/20/12: Angiographic Findings: Left main: No obstructive disease.  Left Anterior Descending Artery: Large caliber vessel that courses to the apex. The mid segment is mildly aneurysmal. No obstructive disease noted. Large diagonal branch with mild plaque.  Circumflex Artery: Moderate sized vessel with mild plaque disease. No obstructive lesions noted.  Right Coronary Artery: Large caliber, dominant vessel with 20% proximal stenosis, smooth tubular 20% mid stenosis and 30% stenosis just prior to the bifurcation. The PDA and PL branch are small caliber and both have mild plaque.  Left Ventricular Angiogram: LVEF 50-55%.  Impression: 1. Mild non-obstructive CAD 2. Normal LV systolic function  Recommendations: Continue medical management.   Carotid U/S 02/14/18: Final Interpretation: - Right Carotid: Velocities in the right ICA are consistent with a 1-39% stenosis. - Left Carotid: Velocities in the left ICA are consistent with a 1-39% stenosis. - Vertebrals: Bilateral vertebral arteries demonstrate antegrade flow. - Subclavians: Normal flow hemodynamics were seen in bilateral subclavian arteries.   Past Medical History:  Diagnosis Date  . Allergy   . Arthritis   . Ascending aortic dissection ([employer]) 03/23/2017  . Atrial fibrillation ([place])   . Cataract    removed both eyes   . GERD (gastroesophageal reflux disease)   . HYPERLIPIDEMIA   . 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 Hypertension   . Microscopic hematuria    negative cystoscopy  . NONSPECIFIC ABNORMAL ELECTROCARDIOGRAM   . Pleural effusion on right   . Pleural effusion, bilateral   . S/P aortic dissection repair 03/24/2017   Biological Bentall aortic root replacement + resection and grafting of entire ascending aorta, transverse aortic arch and proximal descending thoracic aorta with elephant trunk distal anastomosis and debranching of aortic arch vessels  . 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 Varicose veins     Past Surgical History:  Procedure Laterality Date  . APPENDECTOMY    . CARDIAC CATHETERIZATION      X3;Dr Caryl Comes, last 01/20/12: mild non-obstructive CAD, normal LV systolic function  . COLONOSCOPY    . CYSTOSCOPY  1978   Dr Hartley Barefoot  . EYE SURGERY    . IR THORACENTESIS ASP PLEURAL SPACE W/IMG GUIDE  11/25/2017  . KNEE ARTHROSCOPY  2012   Dr Theda Sers  . LUMBAR LAMINECTOMY  1987   Dr Durward Fortes  . MYELOGRAM  2007  . PERICARDIOCENTESIS N/A 04/07/2017   Procedure: PERICARDIOCENTESIS;  Surgeon: Sherren Mocha, MD;  Location: [place] [facility];  Service: Cardiovascular;  Laterality: N/A;  . POLYPECTOMY    . REPAIR OF ACUTE ASCENDING THORACIC AORTIC DISSECTION N/A 03/23/2017   Procedure: REPAIR OF ACUTE ASCENDING THORACIC AORTIC DISSECTION.  Bentall procedure.  Aortic root repleacement with bioprosthetic valve.  Reimplantation of left and right coronary arteries.  Total resection of transverse aortic arch.  Elephant trunk distal anastomosis and debranching of arch vessels.;  Surgeon: Rexene Alberts, MD;  Location: [place];  Service: Vascular;  Laterality: N/A;  . ROTATOR CUFF REPAIR    . SHOULDER ARTHROSCOPY  2011   Dr Theda Sers  . SHOULDER ARTHROSCOPY Right 08/2015  . SPINAL FUSION   1986   Dr Rolin Barry  . TEE WITHOUT CARDIOVERSION N/A 03/23/2017   Procedure: TRANSESOPHAGEAL ECHOCARDIOGRAM (TEE);  Surgeon: Rexene Alberts, MD;  Location: [place];  Service: Open Heart Surgery;  Laterality: N/A;  . TRACHEOSTOMY     age 54 for croup    MEDICATIONS: . albuterol (PROAIR HFA) 108 (90 Base) MCG/ACT inhaler  . albuterol (PROVENTIL) (2.5 MG/3ML) 0.083% nebulizer solution  . ALPRAZolam (XANAX) 1 MG tablet  . aspirin EC 81 MG tablet  . augmented betamethasone dipropionate (DIPROLENE-AF) 0.05 % cream  . azelastine (ASTELIN) 0.1 % nasal spray  . azithromycin (ZITHROMAX) 250 MG tablet  . escitalopram (LEXAPRO) 10 MG tablet  . esomeprazole (NEXIUM) 40 MG capsule  . flecainide (TAMBOCOR) 100 MG tablet  . fluticasone (FLONASE) 50 MCG/ACT nasal spray  . fluticasone furoate-vilanterol (BREO ELLIPTA) 200-25 MCG/INH AEPB  . furosemide (LASIX) 40 MG tablet  . metoprolol tartrate (LOPRESSOR) 25 MG tablet  . potassium chloride (K-DUR) 10 MEQ tablet  . predniSONE (DELTASONE) 10 MG tablet  . umeclidinium bromide (INCRUSE ELLIPTA) 62.5 MCG/INH AEPB   No current facility-administered medications for this encounter.   He is continuing ASA perioperatively.   George Hugh [facility] Short Stay Center/Anesthesiology Phone [phone] 03/21/2018 1:50 PM

## 2018-03-29 NOTE — Anesthesia Preprocedure Evaluation (Addendum)
Anesthesia Evaluation  Patient identified by MRN, date of birth, ID band Patient awake    Reviewed: Allergy & Precautions, NPO status , Patient's Chart, lab work & pertinent test results, reviewed documented beta blocker date and time   History of Anesthesia Complications Negative for: history of anesthetic complications  Airway Mallampati: II  TM Distance: >3 FB Neck ROM: Full    Dental  (+) Teeth Intact, Dental Advisory Given   Pulmonary COPD,  COPD inhaler, former smoker,   Hx tracheostomy    breath sounds clear to auscultation       Cardiovascular hypertension, Pt. on medications and Pt. on home beta blockers (-) angina+ Peripheral Vascular Disease  + dysrhythmias Atrial Fibrillation  Rhythm:Regular Rate:Normal   S/p Aortic root replacement with bioprosthetic AV 03/2017  '19 Carotid US - 1-39% b/l ICAS  '18 TTE - LV cavity size was mildly dilated. Mild LVH. EF 50% to 55%. Bioprosthetic AV with trivial central AI. LA was mildly dilated. A trivial pericardial effusion was identified posterior to the heart.   Neuro/Psych Depression negative neurological ROS     GI/Hepatic Neg liver ROS, GERD  Medicated and Controlled,  Endo/Other  negative endocrine ROS  Renal/GU negative Renal ROS  negative genitourinary   Musculoskeletal  (+) Arthritis ,   Abdominal   Peds  Hematology  (+) anemia ,   Anesthesia Other Findings   Reproductive/Obstetrics                           Anesthesia Physical Anesthesia Plan  ASA: III  Anesthesia Plan: General   Post-op Pain Management:    Induction: Intravenous  PONV Risk Score and Plan: 2 and Treatment may vary due to age or medical condition, Ondansetron and Dexamethasone  Airway Management Planned: Oral ETT  Additional Equipment: Arterial line  Intra-op Plan:   Post-operative Plan: Extubation in OR  Informed Consent: I have reviewed the  patients History and Physical, chart, labs and discussed the procedure including the risks, benefits and alternatives for the proposed anesthesia with the patient or authorized representative who has indicated his/her understanding and acceptance.   Dental advisory given  Plan Discussed with: CRNA and Anesthesiologist  Anesthesia Plan Comments:        Anesthesia Quick Evaluation

## 2018-03-30 ENCOUNTER — Inpatient Hospital Stay (HOSPITAL_COMMUNITY): Payer: Medicare Other

## 2018-03-30 ENCOUNTER — Inpatient Hospital Stay (HOSPITAL_COMMUNITY)
Admission: RE | Admit: 2018-03-30 | Discharge: 2018-03-31 | DRG: 221 | Disposition: A | Discharge status: Home Health | Payer: Medicare Other | Source: Ambulatory Visit | Attending: Surgery | Admitting: Surgery

## 2018-03-30 ENCOUNTER — Inpatient Hospital Stay (HOSPITAL_COMMUNITY): Payer: Medicare Other | Admitting: Anesthesiology

## 2018-03-30 ENCOUNTER — Inpatient Hospital Stay (HOSPITAL_COMMUNITY): Payer: Medicare Other | Admitting: Vascular Surgery

## 2018-03-30 ENCOUNTER — Encounter (HOSPITAL_COMMUNITY)
Admission: RE | Disposition: A | Discharge status: Home / Self Care | Payer: Self-pay | Source: Ambulatory Visit | Attending: Surgery

## 2018-03-30 ENCOUNTER — Encounter (HOSPITAL_COMMUNITY): Payer: Self-pay | Admitting: Anesthesiology

## 2018-03-30 DIAGNOSIS — Z885 Allergy status to narcotic agent status: Secondary | ICD-10-CM

## 2018-03-30 DIAGNOSIS — Z886 Allergy status to analgesic agent status: Secondary | ICD-10-CM

## 2018-03-30 DIAGNOSIS — Z9842 Cataract extraction status, left eye: Secondary | ICD-10-CM | POA: Diagnosis not present

## 2018-03-30 DIAGNOSIS — Z91040 Latex allergy status: Secondary | ICD-10-CM | POA: Diagnosis not present

## 2018-03-30 DIAGNOSIS — Z9841 Cataract extraction status, right eye: Secondary | ICD-10-CM

## 2018-03-30 DIAGNOSIS — I10 Essential (primary) hypertension: Secondary | ICD-10-CM | POA: Diagnosis present

## 2018-03-30 DIAGNOSIS — Z952 Presence of prosthetic heart valve: Secondary | ICD-10-CM | POA: Diagnosis not present

## 2018-03-30 DIAGNOSIS — Z87891 Personal history of nicotine dependence: Secondary | ICD-10-CM

## 2018-03-30 DIAGNOSIS — Z8249 Family history of ischemic heart disease and other diseases of the circulatory system: Secondary | ICD-10-CM

## 2018-03-30 DIAGNOSIS — Z881 Allergy status to other antibiotic agents status: Secondary | ICD-10-CM | POA: Diagnosis not present

## 2018-03-30 DIAGNOSIS — I712 Thoracic aortic aneurysm, without rupture, unspecified: Secondary | ICD-10-CM | POA: Diagnosis present

## 2018-03-30 DIAGNOSIS — Z91048 Other nonmedicinal substance allergy status: Secondary | ICD-10-CM

## 2018-03-30 DIAGNOSIS — Z7982 Long term (current) use of aspirin: Secondary | ICD-10-CM

## 2018-03-30 DIAGNOSIS — Z95828 Presence of other vascular implants and grafts: Secondary | ICD-10-CM | POA: Diagnosis not present

## 2018-03-30 DIAGNOSIS — I739 Peripheral vascular disease, unspecified: Secondary | ICD-10-CM | POA: Diagnosis present

## 2018-03-30 DIAGNOSIS — Z981 Arthrodesis status: Secondary | ICD-10-CM

## 2018-03-30 DIAGNOSIS — R001 Bradycardia, unspecified: Secondary | ICD-10-CM | POA: Diagnosis present

## 2018-03-30 DIAGNOSIS — Z7951 Long term (current) use of inhaled steroids: Secondary | ICD-10-CM | POA: Diagnosis not present

## 2018-03-30 DIAGNOSIS — I4891 Unspecified atrial fibrillation: Secondary | ICD-10-CM | POA: Diagnosis not present

## 2018-03-30 DIAGNOSIS — N401 Enlarged prostate with lower urinary tract symptoms: Secondary | ICD-10-CM | POA: Diagnosis present

## 2018-03-30 DIAGNOSIS — Z888 Allergy status to other drugs, medicaments and biological substances status: Secondary | ICD-10-CM

## 2018-03-30 DIAGNOSIS — E785 Hyperlipidemia, unspecified: Secondary | ICD-10-CM | POA: Diagnosis present

## 2018-03-30 DIAGNOSIS — J449 Chronic obstructive pulmonary disease, unspecified: Secondary | ICD-10-CM | POA: Diagnosis present

## 2018-03-30 DIAGNOSIS — Z79899 Other long term (current) drug therapy: Secondary | ICD-10-CM | POA: Diagnosis not present

## 2018-03-30 DIAGNOSIS — I482 Chronic atrial fibrillation: Secondary | ICD-10-CM | POA: Diagnosis present

## 2018-03-30 DIAGNOSIS — K219 Gastro-esophageal reflux disease without esophagitis: Secondary | ICD-10-CM | POA: Diagnosis present

## 2018-03-30 DIAGNOSIS — J90 Pleural effusion, not elsewhere classified: Secondary | ICD-10-CM | POA: Diagnosis not present

## 2018-03-30 HISTORY — PX: THORACIC AORTIC ENDOVASCULAR STENT GRAFT: SHX6112

## 2018-03-30 LAB — CBC
HEMATOCRIT: 34.4 % — AB (ref 39.0–52.0)
Hemoglobin: 10.8 g/dL — ABNORMAL LOW (ref 13.0–17.0)
MCH: 25.7 pg — ABNORMAL LOW (ref 26.0–34.0)
MCHC: 31.4 g/dL (ref 30.0–36.0)
MCV: 81.9 fL (ref 78.0–100.0)
Platelets: 211 10*3/uL (ref 150–400)
RBC: 4.2 MIL/uL — AB (ref 4.22–5.81)
RDW: 16.3 % — AB (ref 11.5–15.5)
WBC: 9.9 10*3/uL (ref 4.0–10.5)

## 2018-03-30 LAB — APTT: APTT: 37 s — AB (ref 24–36)

## 2018-03-30 LAB — BASIC METABOLIC PANEL
ANION GAP: 7 (ref 5–15)
BUN: 14 mg/dL (ref 8–23)
CALCIUM: 8.7 mg/dL — AB (ref 8.9–10.3)
CO2: 25 mmol/L (ref 22–32)
Chloride: 99 mmol/L (ref 98–111)
Creatinine, Ser: 0.96 mg/dL (ref 0.61–1.24)
GFR calc non Af Amer: >60 mL/min (ref >60)
Glucose, Bld: 91 mg/dL (ref 70–99)
POTASSIUM: 4.4 mmol/L (ref 3.5–5.1)
Sodium: 131 mmol/L — ABNORMAL LOW (ref 135–145)

## 2018-03-30 LAB — TYPE AND SCREEN
ABO/RH(D): O NEG
Antibody Screen: POSITIVE

## 2018-03-30 LAB — PROTIME-INR
INR: 1.29
Prothrombin Time: 16 seconds — ABNORMAL HIGH (ref 11.4–15.2)

## 2018-03-30 LAB — POCT ACTIVATED CLOTTING TIME: Activated Clotting Time: 191 seconds

## 2018-03-30 LAB — MAGNESIUM: MAGNESIUM: 1.9 mg/dL (ref 1.7–2.4)

## 2018-03-30 SURGERY — INSERTION, ENDOVASCULAR STENT GRAFT, AORTA, THORACIC
Anesthesia: General | Site: Groin

## 2018-03-30 MED ORDER — FUROSEMIDE 40 MG PO TABS
40.0000 mg | ORAL_TABLET | Freq: Every day | ORAL | Status: DC
  Administered 2018-03-30 – 2018-03-31 (×2): 40 mg via ORAL
  Filled 2018-03-30 (×2): qty 1

## 2018-03-30 MED ORDER — HYDRALAZINE HCL 20 MG/ML IJ SOLN
INTRAMUSCULAR | Status: AC
  Filled 2018-03-30: qty 1

## 2018-03-30 MED ORDER — FENTANYL CITRATE (PF) 250 MCG/5ML IJ SOLN
INTRAMUSCULAR | Status: AC
  Filled 2018-03-30: qty 5

## 2018-03-30 MED ORDER — POTASSIUM CHLORIDE CRYS ER 20 MEQ PO TBCR: 20.0000 meq | EXTENDED_RELEASE_TABLET | Freq: Every day | ORAL | Status: DC | PRN

## 2018-03-30 MED ORDER — MIDAZOLAM HCL 2 MG/2ML IJ SOLN
INTRAMUSCULAR | Status: AC
  Filled 2018-03-30: qty 2

## 2018-03-30 MED ORDER — FLUTICASONE FUROATE-VILANTEROL 200-25 MCG/INH IN AEPB
1.0000 | INHALATION_SPRAY | Freq: Every day | RESPIRATORY_TRACT | Status: DC
  Administered 2018-03-31: 1 via RESPIRATORY_TRACT
  Filled 2018-03-30: qty 28

## 2018-03-30 MED ORDER — MINERAL OIL LIGHT 100 % EX OIL
TOPICAL_OIL | CUTANEOUS | Status: AC
  Filled 2018-03-30: qty 25

## 2018-03-30 MED ORDER — FENTANYL CITRATE (PF) 100 MCG/2ML IJ SOLN
25.0000 ug | INTRAMUSCULAR | Status: DC | PRN
  Administered 2018-03-30 (×3): 50 ug via INTRAVENOUS

## 2018-03-30 MED ORDER — ROCURONIUM BROMIDE 50 MG/5ML IV SOSY
PREFILLED_SYRINGE | INTRAVENOUS | Status: AC
  Filled 2018-03-30: qty 5

## 2018-03-30 MED ORDER — DEXAMETHASONE SODIUM PHOSPHATE 10 MG/ML IJ SOLN
INTRAMUSCULAR | Status: AC
  Filled 2018-03-30: qty 1

## 2018-03-30 MED ORDER — FENTANYL CITRATE (PF) 100 MCG/2ML IJ SOLN
INTRAMUSCULAR | Status: DC | PRN
  Administered 2018-03-30 (×3): 50 ug via INTRAVENOUS

## 2018-03-30 MED ORDER — OXYCODONE HCL 5 MG/5ML PO SOLN: 5.0000 mg | Freq: Once | ORAL | Status: DC | PRN

## 2018-03-30 MED ORDER — VANCOMYCIN HCL IN DEXTROSE 1-5 GM/200ML-% IV SOLN
INTRAVENOUS | Status: AC
  Filled 2018-03-30: qty 200

## 2018-03-30 MED ORDER — SODIUM CHLORIDE 0.9 % IV SOLN: 500.0000 mL | Freq: Once | INTRAVENOUS | Status: DC | PRN

## 2018-03-30 MED ORDER — EPHEDRINE SULFATE-NACL 50-0.9 MG/10ML-% IV SOSY
PREFILLED_SYRINGE | INTRAVENOUS | Status: DC | PRN
  Administered 2018-03-30: 10 mg via INTRAVENOUS

## 2018-03-30 MED ORDER — TRAMADOL HCL 50 MG PO TABS: 50.0000 mg | ORAL_TABLET | Freq: Four times a day (QID) | ORAL | Status: DC | PRN

## 2018-03-30 MED ORDER — ALUM & MAG HYDROXIDE-SIMETH 200-200-20 MG/5ML PO SUSP: 15.0000 mL | ORAL | Status: DC | PRN

## 2018-03-30 MED ORDER — LABETALOL HCL 5 MG/ML IV SOLN
10.0000 mg | INTRAVENOUS | Status: DC | PRN
  Filled 2018-03-30: qty 4

## 2018-03-30 MED ORDER — ONDANSETRON HCL 4 MG/2ML IJ SOLN: 4.0000 mg | Freq: Once | INTRAMUSCULAR | Status: DC | PRN

## 2018-03-30 MED ORDER — ONDANSETRON HCL 4 MG/2ML IJ SOLN
INTRAMUSCULAR | Status: DC | PRN
  Administered 2018-03-30: 4 mg via INTRAVENOUS

## 2018-03-30 MED ORDER — UMECLIDINIUM BROMIDE 62.5 MCG/INH IN AEPB
1.0000 | INHALATION_SPRAY | Freq: Every day | RESPIRATORY_TRACT | Status: DC
  Administered 2018-03-31: 1 via RESPIRATORY_TRACT
  Filled 2018-03-30: qty 7

## 2018-03-30 MED ORDER — GUAIFENESIN-DM 100-10 MG/5ML PO SYRP: 15.0000 mL | ORAL_SOLUTION | ORAL | Status: DC | PRN

## 2018-03-30 MED ORDER — METOPROLOL TARTRATE 50 MG PO TABS
50.0000 mg | ORAL_TABLET | Freq: Two times a day (BID) | ORAL | Status: DC
  Administered 2018-03-30 – 2018-03-31 (×2): 50 mg via ORAL
  Filled 2018-03-30 (×3): qty 1

## 2018-03-30 MED ORDER — FENTANYL CITRATE (PF) 100 MCG/2ML IJ SOLN
INTRAMUSCULAR | Status: AC
  Filled 2018-03-30: qty 2

## 2018-03-30 MED ORDER — CHLORHEXIDINE GLUCONATE 4 % EX LIQD: 60.0000 mL | Freq: Once | CUTANEOUS | Status: DC

## 2018-03-30 MED ORDER — HEPARIN SODIUM (PORCINE) 1000 UNIT/ML IJ SOLN
INTRAMUSCULAR | Status: AC
  Filled 2018-03-30: qty 1

## 2018-03-30 MED ORDER — ESCITALOPRAM OXALATE 10 MG PO TABS
10.0000 mg | ORAL_TABLET | Freq: Every day | ORAL | Status: DC
  Administered 2018-03-31: 10 mg via ORAL
  Filled 2018-03-30 (×2): qty 1

## 2018-03-30 MED ORDER — PROPOFOL 10 MG/ML IV BOLUS
INTRAVENOUS | Status: DC | PRN
  Administered 2018-03-30: 130 mg via INTRAVENOUS

## 2018-03-30 MED ORDER — ONDANSETRON HCL 4 MG/2ML IJ SOLN
INTRAMUSCULAR | Status: AC
  Filled 2018-03-30: qty 2

## 2018-03-30 MED ORDER — MIDAZOLAM HCL 5 MG/5ML IJ SOLN
INTRAMUSCULAR | Status: DC | PRN
  Administered 2018-03-30: 2 mg via INTRAVENOUS

## 2018-03-30 MED ORDER — OXYCODONE HCL 5 MG PO TABS: 5.0000 mg | ORAL_TABLET | Freq: Once | ORAL | Status: DC | PRN

## 2018-03-30 MED ORDER — HYDRALAZINE HCL 20 MG/ML IJ SOLN
5.0000 mg | INTRAMUSCULAR | Status: AC | PRN
  Administered 2018-03-30 (×2): 5 mg via INTRAVENOUS
  Filled 2018-03-30 (×3): qty 0.25

## 2018-03-30 MED ORDER — 0.9 % SODIUM CHLORIDE (POUR BTL) OPTIME
TOPICAL | Status: DC | PRN
  Administered 2018-03-30: 1000 mL

## 2018-03-30 MED ORDER — EPHEDRINE 5 MG/ML INJ
INTRAVENOUS | Status: AC
  Filled 2018-03-30: qty 10

## 2018-03-30 MED ORDER — IODIXANOL 320 MG/ML IV SOLN
INTRAVENOUS | Status: DC | PRN
  Administered 2018-03-30: 56.6 mL

## 2018-03-30 MED ORDER — ASPIRIN EC 81 MG PO TBEC
81.0000 mg | DELAYED_RELEASE_TABLET | Freq: Every day | ORAL | Status: DC
  Administered 2018-03-30 – 2018-03-31 (×2): 81 mg via ORAL
  Filled 2018-03-30 (×2): qty 1

## 2018-03-30 MED ORDER — ONDANSETRON HCL 4 MG/2ML IJ SOLN: 4.0000 mg | Freq: Four times a day (QID) | INTRAMUSCULAR | Status: DC | PRN

## 2018-03-30 MED ORDER — PANTOPRAZOLE SODIUM 40 MG PO TBEC
40.0000 mg | DELAYED_RELEASE_TABLET | Freq: Every day | ORAL | Status: DC
  Administered 2018-03-31: 40 mg via ORAL
  Filled 2018-03-30 (×2): qty 1

## 2018-03-30 MED ORDER — MINERAL OIL LIGHT 100 % EX OIL
TOPICAL_OIL | CUTANEOUS | Status: DC | PRN
  Administered 2018-03-30: 1 via TOPICAL

## 2018-03-30 MED ORDER — ROCURONIUM BROMIDE 50 MG/5ML IV SOSY
PREFILLED_SYRINGE | INTRAVENOUS | Status: DC | PRN
  Administered 2018-03-30: 50 mg via INTRAVENOUS

## 2018-03-30 MED ORDER — ALBUTEROL SULFATE (2.5 MG/3ML) 0.083% IN NEBU: 3.0000 mL | INHALATION_SOLUTION | RESPIRATORY_TRACT | Status: DC | PRN

## 2018-03-30 MED ORDER — ALPRAZOLAM 0.5 MG PO TABS
1.0000 mg | ORAL_TABLET | Freq: Every day | ORAL | Status: DC
  Administered 2018-03-30: 1 mg via ORAL
  Filled 2018-03-30: qty 2

## 2018-03-30 MED ORDER — SODIUM CHLORIDE 0.9 % IV SOLN
INTRAVENOUS | Status: DC | PRN
  Administered 2018-03-30: 500 mL

## 2018-03-30 MED ORDER — DOCUSATE SODIUM 100 MG PO CAPS
100.0000 mg | ORAL_CAPSULE | Freq: Every day | ORAL | Status: DC
  Administered 2018-03-31: 100 mg via ORAL
  Filled 2018-03-30 (×2): qty 1

## 2018-03-30 MED ORDER — PROTAMINE SULFATE 10 MG/ML IV SOLN
INTRAVENOUS | Status: AC
  Filled 2018-03-30: qty 5

## 2018-03-30 MED ORDER — HEPARIN SODIUM (PORCINE) 1000 UNIT/ML IJ SOLN
INTRAMUSCULAR | Status: DC | PRN
  Administered 2018-03-30: 9000 [IU] via INTRAVENOUS
  Administered 2018-03-30: 2000 [IU] via INTRAVENOUS

## 2018-03-30 MED ORDER — SODIUM CHLORIDE 0.9 % IV SOLN: INTRAVENOUS | Status: DC

## 2018-03-30 MED ORDER — PHENOL 1.4 % MT LIQD: 1.0000 | OROMUCOSAL | Status: DC | PRN

## 2018-03-30 MED ORDER — ACETAMINOPHEN 325 MG PO TABS: 325.0000 mg | ORAL_TABLET | ORAL | Status: DC | PRN

## 2018-03-30 MED ORDER — DEXAMETHASONE SODIUM PHOSPHATE 10 MG/ML IJ SOLN
INTRAMUSCULAR | Status: DC | PRN
  Administered 2018-03-30: 10 mg via INTRAVENOUS

## 2018-03-30 MED ORDER — MAGNESIUM SULFATE 2 GM/50ML IV SOLN: 2.0000 g | Freq: Every day | INTRAVENOUS | Status: DC | PRN

## 2018-03-30 MED ORDER — METOPROLOL TARTRATE 5 MG/5ML IV SOLN: 2.0000 mg | INTRAVENOUS | Status: DC | PRN

## 2018-03-30 MED ORDER — POLYETHYLENE GLYCOL 3350 17 G PO PACK: 17.0000 g | PACK | Freq: Every day | ORAL | Status: DC | PRN

## 2018-03-30 MED ORDER — CLINDAMYCIN PHOSPHATE 300 MG/50ML IV SOLN
300.0000 mg | Freq: Three times a day (TID) | INTRAVENOUS | Status: DC
  Administered 2018-03-30 – 2018-03-31 (×3): 300 mg via INTRAVENOUS
  Filled 2018-03-30 (×4): qty 50

## 2018-03-30 MED ORDER — ACETAMINOPHEN 325 MG RE SUPP: 325.0000 mg | RECTAL | Status: DC | PRN

## 2018-03-30 MED ORDER — VANCOMYCIN HCL IN DEXTROSE 1-5 GM/200ML-% IV SOLN
1000.0000 mg | INTRAVENOUS | Status: AC
  Administered 2018-03-30: 1000 mg via INTRAVENOUS

## 2018-03-30 MED ORDER — SODIUM CHLORIDE 0.9 % IV SOLN
INTRAVENOUS | Status: AC
  Filled 2018-03-30: qty 1.2

## 2018-03-30 MED ORDER — LACTATED RINGERS IV SOLN
INTRAVENOUS | Status: DC | PRN
  Administered 2018-03-30: 08:00:00 via INTRAVENOUS

## 2018-03-30 MED ORDER — HEPARIN SODIUM (PORCINE) 5000 UNIT/ML IJ SOLN
5000.0000 [IU] | Freq: Three times a day (TID) | INTRAMUSCULAR | Status: DC
  Administered 2018-03-30 – 2018-03-31 (×3): 5000 [IU] via SUBCUTANEOUS
  Filled 2018-03-30 (×2): qty 1

## 2018-03-30 MED ORDER — POTASSIUM CHLORIDE ER 10 MEQ PO TBCR
10.0000 meq | EXTENDED_RELEASE_TABLET | Freq: Every day | ORAL | Status: DC
  Administered 2018-03-30 – 2018-03-31 (×2): 10 meq via ORAL
  Filled 2018-03-30 (×4): qty 1

## 2018-03-30 MED ORDER — PROPOFOL 10 MG/ML IV BOLUS
INTRAVENOUS | Status: AC
  Filled 2018-03-30: qty 20

## 2018-03-30 MED ORDER — MORPHINE SULFATE (PF) 2 MG/ML IV SOLN: 2.0000 mg | INTRAVENOUS | Status: DC | PRN

## 2018-03-30 MED ORDER — FLECAINIDE ACETATE 50 MG PO TABS
100.0000 mg | ORAL_TABLET | Freq: Two times a day (BID) | ORAL | Status: DC
  Administered 2018-03-30 – 2018-03-31 (×2): 100 mg via ORAL
  Filled 2018-03-30 (×3): qty 2

## 2018-03-30 MED ORDER — PROTAMINE SULFATE 10 MG/ML IV SOLN
INTRAVENOUS | Status: DC | PRN
  Administered 2018-03-30: 50 mg via INTRAVENOUS

## 2018-03-30 MED ORDER — LIDOCAINE 2% (20 MG/ML) 5 ML SYRINGE
INTRAMUSCULAR | Status: AC
Start: 2018-03-30 — End: ?
  Filled 2018-03-30: qty 5

## 2018-03-30 MED ORDER — SUGAMMADEX SODIUM 200 MG/2ML IV SOLN
INTRAVENOUS | Status: DC | PRN
  Administered 2018-03-30: 200 mg via INTRAVENOUS

## 2018-03-30 MED ORDER — SODIUM CHLORIDE 0.9 % IV SOLN
INTRAVENOUS | Status: DC | PRN
  Administered 2018-03-30: 25 ug/min via INTRAVENOUS

## 2018-03-30 MED ORDER — SODIUM CHLORIDE 0.9 % IV SOLN
INTRAVENOUS | Status: DC
  Administered 2018-03-30: 16:00:00 via INTRAVENOUS

## 2018-03-30 MED ORDER — ALBUTEROL SULFATE (2.5 MG/3ML) 0.083% IN NEBU: 2.5000 mg | INHALATION_SOLUTION | Freq: Three times a day (TID) | RESPIRATORY_TRACT | Status: DC | PRN

## 2018-03-30 MED ORDER — LIDOCAINE 2% (20 MG/ML) 5 ML SYRINGE
INTRAMUSCULAR | Status: DC | PRN
  Administered 2018-03-30: 100 mg via INTRAVENOUS

## 2018-03-30 SURGICAL SUPPLY — 59 items
ADH SKN CLS APL DERMABOND .7 (GAUZE/BANDAGES/DRESSINGS) ×1
CANISTER SUCT 3000ML PPV (MISCELLANEOUS) ×3 IMPLANT
CATH ACCU-VU SIZ PIG 5F 100CM (CATHETERS) ×2 IMPLANT
CATH VISIONS PV .035 IVUS (CATHETERS) ×2 IMPLANT
COVER PROBE W GEL 5X96 (DRAPES) ×3 IMPLANT
DERMABOND ADVANCED (GAUZE/BANDAGES/DRESSINGS) ×2
DERMABOND ADVANCED .7 DNX12 (GAUZE/BANDAGES/DRESSINGS) ×1 IMPLANT
DEVICE CLOSURE PERCLS PRGLD 6F (VASCULAR PRODUCTS) IMPLANT
DRAPE ZERO GRAVITY STERILE (DRAPES) ×3 IMPLANT
DRSG TEGADERM 2-3/8X2-3/4 SM (GAUZE/BANDAGES/DRESSINGS) ×3 IMPLANT
DRYSEAL FLEXSHEATH 20FR 33CM (SHEATH) ×2
ELECT CAUTERY BLADE 6.4 (BLADE) ×3 IMPLANT
ELECT REM PT RETURN 9FT ADLT (ELECTROSURGICAL) ×6
ELECTRODE REM PT RTRN 9FT ADLT (ELECTROSURGICAL) ×2 IMPLANT
ENDOPROSTHESIS THORAC 28X28X10 (Endovascular Graft) IMPLANT
ENDOPROSTHESIS THORAC 28X28X15 (Endovascular Graft) IMPLANT
ENDOPROTH THORACIC 28X28X10 (Endovascular Graft) ×3 IMPLANT
ENDOPROTH THORACIC 28X28X15 (Endovascular Graft) ×9 IMPLANT
GLOVE BIOGEL PI IND STRL 7.5 (GLOVE) ×1 IMPLANT
GLOVE BIOGEL PI INDICATOR 7.5 (GLOVE) ×2
GLOVE SURG SS PI 7.5 STRL IVOR (GLOVE) ×3 IMPLANT
GOWN STRL REUS W/ TWL LRG LVL3 (GOWN DISPOSABLE) ×2 IMPLANT
GOWN STRL REUS W/ TWL XL LVL3 (GOWN DISPOSABLE) ×1 IMPLANT
GOWN STRL REUS W/TWL LRG LVL3 (GOWN DISPOSABLE) ×6
GOWN STRL REUS W/TWL XL LVL3 (GOWN DISPOSABLE) ×3
GRAFT BALLN CATH 65CM (STENTS) IMPLANT
HEMOSTAT SNOW SURGICEL 2X4 (HEMOSTASIS) IMPLANT
KIT BASIN OR (CUSTOM PROCEDURE TRAY) ×3 IMPLANT
KIT TURNOVER KIT B (KITS) ×3 IMPLANT
NDL PERC 18GX7CM (NEEDLE) ×1 IMPLANT
NEEDLE PERC 18GX7CM (NEEDLE) ×3 IMPLANT
NS IRRIG 1000ML POUR BTL (IV SOLUTION) ×3 IMPLANT
PACK ENDOVASCULAR (PACKS) ×3 IMPLANT
PAD ARMBOARD 7.5X6 YLW CONV (MISCELLANEOUS) ×6 IMPLANT
PENCIL BUTTON HOLSTER BLD 10FT (ELECTRODE) ×3 IMPLANT
PERCLOSE PROGLIDE 6F (VASCULAR PRODUCTS) ×6
SET MICROPUNCTURE 5F STIFF (MISCELLANEOUS) ×2 IMPLANT
SHEATH AVANTI 11CM 8FR (SHEATH) IMPLANT
SHEATH DRYSEAL FLEX 20FR 33CM (SHEATH) IMPLANT
SHEATH PINNACLE 8F 10CM (SHEATH) ×2 IMPLANT
SHIELD RADPAD SCOOP 12X17 (MISCELLANEOUS) ×6 IMPLANT
SLEEVE ISOL F/PACE RF HD COVER (MISCELLANEOUS) ×2 IMPLANT
STENT GRAFT BALLN CATH 65CM (STENTS)
STOPCOCK MORSE 400PSI 3WAY (MISCELLANEOUS) ×3 IMPLANT
SUT ETHILON 3 0 PS 1 (SUTURE) IMPLANT
SUT PROLENE 5 0 C 1 24 (SUTURE) IMPLANT
SUT VIC AB 2-0 CT1 27 (SUTURE)
SUT VIC AB 2-0 CT1 TAPERPNT 27 (SUTURE) IMPLANT
SUT VIC AB 3-0 SH 27 (SUTURE)
SUT VIC AB 3-0 SH 27X BRD (SUTURE) IMPLANT
SUT VICRYL 4-0 PS2 18IN ABS (SUTURE) IMPLANT
SYR 30ML LL (SYRINGE) IMPLANT
SYR 50ML LL SCALE MARK (SYRINGE) ×3 IMPLANT
TOWEL GREEN STERILE (TOWEL DISPOSABLE) ×3 IMPLANT
TRAY FOLEY MTR SLVR 16FR STAT (SET/KITS/TRAYS/PACK) ×3 IMPLANT
TUBING HIGH PRESSURE 120CM (CONNECTOR) ×5 IMPLANT
WIRE AMPLATZ SS-J .035X180CM (WIRE) ×2 IMPLANT
WIRE BENTSON .035X145CM (WIRE) ×4 IMPLANT
WIRE STIFF LUNDERQUIST 260CM (WIRE) ×2 IMPLANT

## 2018-03-30 NOTE — Anesthesia Postprocedure Evaluation (Signed)
Anesthesia Post Note  Patient: Sean Riley  Procedure(s) Performed: THORACIC AORTIC ENDOVASCULAR STENT GRAFT WITH INTRAVASCULAR ULTRASOUND (N/A Groin)     Patient location during evaluation: PACU Anesthesia Type: General Level of consciousness: awake and alert Pain management: pain level controlled Vital Signs Assessment: post-procedure vital signs reviewed and stable Respiratory status: spontaneous breathing, nonlabored ventilation, respiratory function stable and patient connected to nasal cannula oxygen Cardiovascular status: blood pressure returned to baseline and stable Postop Assessment: no apparent nausea or vomiting Anesthetic complications: no    Last Vitals:  Vitals:   03/30/18 1400 03/30/18 1430  BP: (!) 127/58 (!) 133/58  Pulse: 62 (!) 59  Resp: 14 10  Temp: (!) 36.4 C (!) 36.4 C  SpO2: 92% 93%    Last Pain:  Vitals:   03/30/18 1330  TempSrc:   PainSc: [place]ick[place]

## 2018-03-30 NOTE — Op Note (Signed)
Patient name: Sean Riley MRN: [id number] DOB: [birth year] Sex: male  03/30/2018 Pre-operative Diagnosis: Thoracic aortic aneurysm Post-operative diagnosis:  Same Surgeon:  Annamarie Major Assistants: Arlee Muslim, PA Procedure:   #1: Ultrasound-guided access, right femoral artery   #2: Aortic arch angiogram   #3: Abdominal aortogram   #4: Endovascular repair of descending thoracic aortic aneurysm without coverage of the left subclavian artery   #5: Distal extension x3   #6:  IVUS aortic arch, descending thoracic aorta and abdominal aorta Anesthesia: General Blood Loss: Minimal Specimens: None  Findings: Complete exclusion.  4 overlapping grafts were placed in order to cover the thoracic aorta from the elephant trunk graft down to approximately 2 cm above the celiac artery.  Devices used: Gore CTAG 28 x 15 x 3, 28 x 10 x 1  Indications: The patient has previously undergone open repair of a aortic dissection which included valve repair, aortic arch deep branching with elephant trunk.  He has had progressive enlargement of the descending thoracic aorta with persistent dissection.  He comes in today for repair.  I elected not to place a spinal drain despite the long coverage of his thoracic aorta, because of his multiple back injuries.  Procedure:  The patient was identified in the holding area and taken to [facility] [facility]  The patient was then placed supine on the table. general anesthesia was administered.  The patient was prepped and draped in the usual sterile fashion.  A time out was called and antibiotics were administered.  Ultrasound was used to evaluate the right common femoral artery which was calcified but patent.  A #11 blade was used to make a skin nick.  The right common femoral artery was then cannulated under ultrasound guidance with a micropuncture needle.  An 018 wire was advanced without resistance and the micropuncture sheath was placed.  A Bentson wire was then inserted  into the abdominal aorta.  The subtenons tract was dilated with an 8 [place] dilator.  Probe glide devices were deployed at the 11:00 and 1 o'clock position for pre-closure.  An 8 French sheath was then placed.  I then advanced a pigtail through the abdominal aorta and descending thoracic aorta, rotating it as I went along so as to avoid going through a fenestration.  I then used the catheter and Bentson wire to select the elephant trunk graft and then advanced the pigtail catheter around the aortic arch into the ascending aorta.  A Lunderquist double core wire was then placed.  The patient was fully heparinized.  Heparin levels were monitored with ACT measurements and redosed appropriately.  A IVUS catheter was then used to evaluate the abdominal and thoracic aorta as well as the aortic arch.  This confirmed that the wire remained in the true lumen.  I was able to evaluate and identified the distal end of the elephant trunk as well as the location of the arch deep branching graft.  Measurements of the graft were taken at this level to confirm that the right size device was selected.  The IVUS catheter was then removed.  A 20 French dry seal sheath was coated with mineral oil and then advanced into the abdominal aorta without resistance.  A pigtail catheter was advanced into the ascending aorta and aortic arch angiogram was performed and approximately a 60 degree LAO oblique.  This identified the appropriate landing point for the stent graft.  I then prepared the first device which was a Landscape architect  x 15 device.  This was placed into the appropriate position and then deployed.  The delivery system was removed and a second 28 x 15 device was inserted.  I ensure the proper overlap and then deployed this device.  The delivery system was removed and I reinserted the IVUS catheter to locate the renal arteries as well as the celiac and superior mesenteric artery which were marked on the screen.  The catheter was then  removed.  I selected a third 28 x 15 device.  At this point I asked anesthesia to raise his mean arterial pressure to greater than 80 since we did not have a spinal drain in place.  I deployed the next device with approximately 5 cm of overlap.  The delivery system was removed.  I then inserted a Bentson wire into the back of the dry seal sheath.  The catheter was advanced into the distal graft and an abdominal aortogram was performed which located the celiac artery superior mesenteric artery and renal arteries.  This confirmed that our IVUS catheter markings were correct.  I then inserted the final piece which was a Gore 28 x 10 device.  This was deployed landing about 2 cm above the origin of the celiac artery.  Once the delivery system was removed, a pigtail catheter was advanced over the Lunderquist wire into the ascending aorta and a completion arteriogram was performed.  This showed preservation of blood flow to the arch deep branching graft as well as patency of the visceral vessels.  There was no leak between the graft and so I elected not to perform balloon molding of the grafts.  I was satisfied with these results and so the pigtail catheter was removed over a Bentson wire.  I slowly removed the 20 French sheath down to the femoral head to make sure there was no injury to the iliac arteries.  There was no change in blood pressure and the sheath came out without difficulty.  I then remove the sheath and secured the 2 Pro-glide devices for closure of the arteriotomy.  The heparin was then reversed and manual pressure was held for 10 minutes.  The groin was hemostatic.  Cautery was used on the subcutaneous tissue and Dermabond was placed.  Patient had palpable pedal pulses after the procedure.  He was successfully awakened from anesthesia and found to be moving all 4 extremities to command.  He was taken to recovery in stable condition.   Disposition: To PACU stable   V. Annamarie Major, M.D. [facility] [place] Office: [phone] Pager:  [phone]

## 2018-03-30 NOTE — Progress Notes (Signed)
Patient received from PACU, A&Ox4, VSS. Telemetry applied and CCMD notified. 

## 2018-03-30 NOTE — H&P (Signed)
[facility]  Patient name: Sean Riley      MRN: [id number]        DOB: [birth year]          Sex: male   REASON FOR VISIT:    Follow  Up TAAA  HISOTRY OF PRESENT ILLNESS:    Sean Riley is a 67 y.o. 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  He underwent complete aortic arch replacement with elephant trunk as well as bioprosthetic tissue valve.  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  He is a former smoker  Patient is medically managed for hypertension as well as bradycardia.    He was recently evaluated by pulmonary medicine who identified moderate-severe emphysema.   PAST MEDICAL HISTORY:       Past Medical History:  Diagnosis Date  . Allergy   . Arthritis   . 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 Atrial fibrillation ([place])   . Cataract    removed both eyes   . GERD (gastroesophageal reflux disease)   . HYPERLIPIDEMIA   . 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 Hypertension   . Microscopic hematuria    negative cystoscopy  . NONSPECIFIC ABNORMAL ELECTROCARDIOGRAM   . Pleural effusion on right   . Pleural effusion, bilateral   . S/P aortic dissection repair 03/24/2017   Biological Bentall aortic root replacement + resection and grafting of entire ascending aorta, transverse aortic arch and proximal descending thoracic aorta with elephant trunk distal anastomosis and debranching of aortic arch vessels  . 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 Varicose veins      FAMILY HISTORY:        Family History  Problem Relation Age of Onset  . Hypertension  Mother   . Hypertension Father   . Benign prostatic hyperplasia Father        S/P TURP  . Heart attack Maternal Grandmother        MI in 34s  . Breast cancer Maternal Grandmother   . Arrhythmia Brother         X 2  . Heart attack Maternal Aunt        MI in 66s  . Stroke Neg Hx   . Diabetes Neg Hx   . Colon cancer Neg Hx   . Colon polyps Neg Hx   . Stomach cancer Neg Hx   . Rectal cancer Neg Hx   . Esophageal cancer Neg Hx   . Prostate cancer Neg Hx     SOCIAL HISTORY:   Social History        Tobacco Use  . Smoking status: Former Smoker    Packs/day: 2.00    Years: 45.00    Pack years: 90.00    Types: Cigarettes    Last attempt to quit: 03/22/2017    Years since quitting: 0.9  . Smokeless tobacco: Never Used  . Tobacco comment: 2 ppd , quit 03/2017  Substance Use Topics  . Alcohol use: Yes    Alcohol/week: 0.0 oz    Comment: 5 cans of beer daily     ALLERGIES:        Allergies  Allergen Reactions  . Simvastatin Other (See Comments)    Mental status changes  . Celecoxib Other (See Comments)    GI UPSET AND INFLAMMATION  . Codeine Other (See Comments)    GI UPSET AND INFLAMMATION  . Nsaids Other (See Comments)    GI UPSET AND INFLAMMATION (can tolerate via IV)  . Tape Other (See Comments)    Medical tape and Band-Aids PULL OFF THE SKIN; please use Coban wrap  . Cephalexin Itching and Rash  . Latex Rash     CURRENT MEDICATIONS:         Current Outpatient Medications  Medication Sig Dispense Refill  . albuterol (PROAIR HFA) 108 (90 Base) MCG/ACT inhaler Inhale two puffs every four to six hours as needed for cough or wheeze. (Patient taking differently: Inhale 2 puffs into the lungs See admin instructions. Every 4-6 hours as needed for coughing or wheezing) 3 Inhaler 0  . albuterol (PROVENTIL) (2.5 MG/3ML) 0.083% nebulizer solution Take 3 mLs (2.5 mg total) by nebulization every 8 (eight) hours as  needed for wheezing or shortness of breath. 150 mL 1  . ALPRAZolam (XANAX) 1 MG tablet Take 1-1.5 tablets (1-1.5 mg total) by mouth at bedtime. 40 tablet 3  . aspirin EC 81 MG tablet Take 1 tablet (81 mg total) by mouth daily.    Marland Kitchen augmented betamethasone dipropionate (DIPROLENE-AF) 0.05 % cream Apply topically 2 (two) times daily as needed. 100 g 3  . azelastine (ASTELIN) 0.1 % nasal spray Can use two sprays in each nostril twice daily as directed. (Patient taking differently: Place 2 sprays into both nostrils 2 (two) times daily. ) 90 mL 3  . escitalopram (LEXAPRO) 10 MG tablet Take 1 tablet (10 mg total) by mouth daily. 30 tablet 5  . esomeprazole (NEXIUM) 40 MG capsule Take 1 capsule (40 mg total) by mouth daily before breakfast. 90 capsule 3  . flecainide (TAMBOCOR) 100 MG tablet Take 1 tablet (100 mg total) by mouth 2 (two) times daily. 180 tablet 3  . fluticasone (FLONASE) 50 MCG/ACT nasal spray Use one spray in each nostril twice daily. 48 g 3  . furosemide (LASIX) 40 MG tablet Take 1 tablet (40 mg total) by mouth daily. 30 tablet 5  . metoprolol tartrate (LOPRESSOR) 25 MG tablet Take 2 tablets (50 mg total) by mouth 2 (two) times daily. 180 tablet 3  . potassium chloride (K-DUR) 10 MEQ tablet Take 1 tablet (10 mEq total) by mouth daily. 30 tablet 5  . umeclidinium bromide (INCRUSE ELLIPTA) 62.5 MCG/INH AEPB Inhale 1 puff into the lungs daily.    . fluticasone furoate-vilanterol (BREO ELLIPTA) 200-25 MCG/INH AEPB Inhale 1 puff into the lungs daily. (Patient not taking: Reported on 02/14/2018) 180 each 3   No current facility-administered medications for this visit.     REVIEW OF SYSTEMS:   [X]  denotes positive finding, [ ]  denotes negative finding Cardiac  Comments:  Chest pain or chest pressure:    Shortness of breath upon exertion:    Short of breath when lying flat:    Irregular heart rhythm:        Vascular    Pain in calf, thigh, or hip brought on by  ambulation:    Pain in feet at night that wakes you up from your sleep:     Blood clot in your veins:    Leg swelling:         Pulmonary    Oxygen at home:    Productive  cough:     Wheezing:         Neurologic    Sudden weakness in arms or legs:     Sudden numbness in arms or legs:     Sudden onset of difficulty speaking or slurred speech:    Temporary loss of vision in one eye:     Problems with dizziness:         Gastrointestinal    Blood in stool:     Vomited blood:         Genitourinary    Burning when urinating:     Blood in urine:        Psychiatric    Major depression:         Hematologic    Bleeding problems:    Problems with blood clotting too easily:        Skin    Rashes or ulcers:        Constitutional    Fever or chills:      PHYSICAL EXAM:       Vitals:   02/14/18 1452 02/14/18 1455  BP: 126/70 135/69  Pulse: (!) 46 (!) 46  Resp: 18   Temp: 97.6 F (36.4 C)   TempSrc: Oral   SpO2: 96%   Weight: 178 lb (80.7 kg)   Height: 6' (1.829 m)     GENERAL: The patient is a well-nourished male, in no acute distress. The vital signs are documented above. CARDIAC: There is a regular rate and rhythm.  PULMONARY: Non-labored respirations ABDOMEN: Soft and non-tender with normal pitched bowel sounds.  MUSCULOSKELETAL: There are no major deformities or cyanosis. NEUROLOGIC: No focal weakness or paresthesias are detected. SKIN: There are no ulcers or rashes noted. PSYCHIATRIC: The patient has a normal affect.  STUDIES:   I have reviewed the CT scan with the following findings: 1. Maximal caliber of the proximal descending thoracic aorta at the level of residual dissection is felt to be approximately 5.1-5.2 cm on the current study. Measurement on the most recent study of 11/22/2017 May have been slightly overestimated based on obliquity of measurement. 2.  There is definite increase in caliber of the mid descending thoracic aorta over time since dissection repair with maximal diameter now of 4.4 cm. 3. Maximal caliber of the proximal abdominal aorta is 4.0 cm just above the celiac takeoff. 4. Four separate renal arteries with dissection flap extending just into the origin of the superior right renal artery. Based on the CTA, it is not felt that there is likely any significant ischemia of the right kidney.   The patient had normal carotid Doppler studies today as well as normal ABIs.  MEDICAL ISSUES:    TAAA: We discussed proceeding with endovascular repair.  We discussed the risk benefits the procedure including the risk of cardiopulmonary-pulmonary complications, groin access complications, the possibility of a retroperitoneal iliac artery exposure, and paralysis.  He understands that the aorta of the lobe of the stent may also become aneurysmal and will need to be monitored over time.  I have scheduled his operation for Wednesday, August 7.  I will consider a lumbar drain, however he has had spinal fusion and this may complicate placing a drain.  He is scheduled to see pulmonary at the end of July for final approval to proceed with surgery.   Annamarie Major, MD [facility] [phone] Pager [phone]   No interval changes PE: CV:RRR PULM: CTA [place] intact  Plan TEVAR.  Re-discussed risks and benefits  Annamarie Major,

## 2018-03-30 NOTE — Anesthesia Procedure Notes (Signed)
Procedure Name: Intubation Date/Time: 03/30/2018 8:49 AM Performed by: Kyung Rudd, CRNA Pre-anesthesia Checklist: Patient identified, Emergency Drugs available, Suction available, Patient being monitored and Timeout performed Patient Re-evaluated:Patient Re-evaluated prior to induction Oxygen Delivery Method: Circle system utilized Preoxygenation: Pre-oxygenation with 100% oxygen Induction Type: IV induction Ventilation: Mask ventilation without difficulty Laryngoscope Size: Mac and 4 Grade View: Grade I Tube type: Oral Tube size: 7.5 mm Number of attempts: 1 Airway Equipment and Method: Stylet Placement Confirmation: ETT inserted through vocal cords under direct vision,  positive ETCO2 and breath sounds checked- equal and bilateral Secured at: 22 cm Tube secured with: Tape Dental Injury: Teeth and Oropharynx as per pre-operative assessment

## 2018-03-30 NOTE — Transfer of Care (Signed)
Immediate Anesthesia Transfer of Care Note  Patient: Sean Riley  Procedure(s) Performed: THORACIC AORTIC ENDOVASCULAR STENT GRAFT WITH INTRAVASCULAR ULTRASOUND (N/A Groin)  Patient Location: PACU  Anesthesia Type:General  Level of Consciousness: awake, alert  and oriented  Airway & Oxygen Therapy: Patient Spontanous Breathing and Patient connected to face mask oxygen  Post-op Assessment: Report given to RN, Post -op Vital signs reviewed and stable and Patient moving all extremities X 4  Post vital signs: Reviewed and stable  Last Vitals:  Vitals Value Taken Time  BP 178/81 03/30/2018 10:54 AM  Temp    Pulse 53 03/30/2018 10:59 AM  Resp 16 03/30/2018 10:59 AM  SpO2 100 % 03/30/2018 10:59 AM  Vitals shown include unvalidated device data.  Last Pain:  Vitals:   03/30/18 0739  TempSrc:   PainSc: 0-No pain      Patients Stated Pain Goal: 3 (95/09/32 6712)  Complications: No apparent anesthesia complications

## 2018-03-30 NOTE — Anesthesia Procedure Notes (Signed)
Arterial Line Insertion Start/End8/21/2019 8:05 AM, 03/30/2018 8:12 AM Performed by: Colin Benton, CRNA, CRNA  Patient location: Pre-op. Preanesthetic checklist: patient identified, IV checked, site marked, risks and benefits discussed, surgical consent, monitors and equipment checked, pre-op evaluation and timeout performed Lidocaine 1% used for infiltration and patient sedated Right, radial was placed Catheter size: 20 G Hand hygiene performed , maximum sterile barriers used  and Seldinger technique used Allen's test indicative of satisfactory collateral circulation Attempts: 1 Procedure performed without using ultrasound guided technique. Following insertion, Biopatch and dressing applied. Post procedure assessment: normal  Patient tolerated the procedure well with no immediate complications.

## 2018-03-31 ENCOUNTER — Encounter (HOSPITAL_COMMUNITY): Payer: Self-pay | Admitting: Surgery

## 2018-03-31 LAB — BASIC METABOLIC PANEL
ANION GAP: 8 (ref 5–15)
BUN: 15 mg/dL (ref 8–23)
CHLORIDE: 98 mmol/L (ref 98–111)
CO2: 27 mmol/L (ref 22–32)
Calcium: 8.3 mg/dL — ABNORMAL LOW (ref 8.9–10.3)
Creatinine, Ser: 0.98 mg/dL (ref 0.61–1.24)
GFR calc Af Amer: >60 mL/min (ref >60)
GFR calc non Af Amer: >60 mL/min (ref >60)
GLUCOSE: 102 mg/dL — AB (ref 70–99)
POTASSIUM: 4.5 mmol/L (ref 3.5–5.1)
Sodium: 133 mmol/L — ABNORMAL LOW (ref 135–145)

## 2018-03-31 LAB — CBC
HEMATOCRIT: 32 % — AB (ref 39.0–52.0)
HEMOGLOBIN: 10.2 g/dL — AB (ref 13.0–17.0)
MCH: 26 pg (ref 26.0–34.0)
MCHC: 31.9 g/dL (ref 30.0–36.0)
MCV: 81.4 fL (ref 78.0–100.0)
Platelets: 203 10*3/uL (ref 150–400)
RBC: 3.93 MIL/uL — AB (ref 4.22–5.81)
RDW: 16.1 % — ABNORMAL HIGH (ref 11.5–15.5)
WBC: 16.2 10*3/uL — AB (ref 4.0–10.5)

## 2018-03-31 MED ORDER — TRAMADOL HCL 50 MG PO TABS: 50.0000 mg | ORAL_TABLET | Freq: Four times a day (QID) | ORAL | 0 refills | Status: DC | PRN

## 2018-03-31 NOTE — Discharge Instructions (Signed)
   [facility] [place]   Discharge Instructions  Endovascular Aortic Aneurysm Repair  Please refer to the following instructions for your post-procedure care. Your surgeon or Physician Assistant will discuss any changes with you.  Activity  You are encouraged to walk as much as you can. You can slowly return to normal activities but must avoid strenuous activity and heavy lifting until your doctor tells you it's OK. Avoid activities such as vacuuming or swinging a gold club. It is normal to feel tired for several weeks after your surgery. Do not drive until your doctor gives the OK and you are no longer taking prescription pain medications. It is also normal to have difficulty with sleep habits, eating, and bowel movements after surgery. These will go away with time.  Bathing/Showering  You may shower after you go home. If you have an incision, do not soak in a bathtub, hot tub, or swim until the incision heals completely.  Incision Care  Shower every day. Clean your incision with mild soap and water. Pat the area dry with a clean towel. You do not need a bandage unless otherwise instructed. Do not apply any ointments or creams to your incision. If you clothing is irritating, you may cover your incision with a dry gauze pad.  Diet  Resume your normal diet. There are no special food restrictions following this procedure. A low fat/low cholesterol diet is recommended for all patients with vascular disease. In order to heal from your surgery, it is CRITICAL to get adequate nutrition. Your body requires vitamins, minerals, and protein. Vegetables are the best source of vitamins and minerals. Vegetables also provide the perfect balance of protein. Processed food has little nutritional value, so try to avoid this.  Medications  Resume taking all of your medications unless your doctor or nurse practitioner tells you not to. If your incision is causing pain, you may take  over-the-counter pain relievers such as acetaminophen (Tylenol). If you were prescribed a stronger pain medication, please be aware these medications can cause nausea and constipation. Prevent nausea by taking the medication with a snack or meal. Avoid constipation by drinking plenty of fluids and eating foods with a high amount of fiber, such as fruits, vegetables, and grains. Do not take Tylenol if you are taking prescription pain medications.   Follow up  Our office will schedule a follow-up appointment with a C.T. scan 3-4 weeks after your surgery.  Please call us immediately for any of the following conditions  Severe or worsening pain in your legs or feet or in your abdomen back or chest. Increased pain, redness, drainage (pus) from your incision sit. Increased abdominal pain, bloating, nausea, vomiting or persistent diarrhea. Fever of 101 degrees or higher. Swelling in your leg (s),  Reduce your risk of vascular disease  Stop smoking. If you would like help call [facility] at 1-800-QUIT-NOW (1-800-784-8669) or [facility] at 336-586-4000. Manage your cholesterol Maintain a desired weight Control your diabetes Keep your blood pressure down  If you have questions, please call the office at [phone].   

## 2018-03-31 NOTE — Progress Notes (Signed)
    Subjective  - POD #1, s/p TEAVR  No com[plaints this am   Physical Exam:  Palpable pedal pulses Right groin soft Moving both feet without issues       Assessment/Plan:  POD #1  Anticipate d/c later today or tomorrow  Sean Riley 03/31/2018 8:50 AM --  Vitals:   03/31/18 0000 03/31/18 0803  BP: (!) 117/56   Pulse: 65   Resp: 17   Temp: 98.5 F (36.9 C)   SpO2: 96% 95%    Intake/Output Summary (Last 24 hours) at 03/31/2018 0850 Last data filed at 03/31/2018 4680 Gross per 24 hour  Intake 1333 ml  Output 2625 ml  Net -1292 ml     Laboratory CBC    Component Value Date/Time   WBC 16.2 (H) 03/31/2018 0436   HGB 10.2 (L) 03/31/2018 0436   HCT 32.0 (L) 03/31/2018 0436   PLT 203 03/31/2018 0436    BMET    Component Value Date/Time   NA 133 (L) 03/31/2018 0436   NA 132 (L) 04/28/2017 1032   K 4.5 03/31/2018 0436   CL 98 03/31/2018 0436   CO2 27 03/31/2018 0436   GLUCOSE 102 (H) 03/31/2018 0436   BUN 15 03/31/2018 0436   BUN 6 (L) 04/28/2017 1032   CREATININE 0.98 03/31/2018 0436   CALCIUM 8.3 (L) 03/31/2018 0436   GFRNONAA >60 03/31/2018 0436   GFRAA >60 03/31/2018 0436    COAG Lab Results  Component Value Date   INR 1.29 03/30/2018   INR 1.12 03/21/2018   INR 1.09 03/24/2017   No results found for: PTT  Antibiotics Anti-infectives (From admission, onward)   Start     Dose/Rate Route Frequency Ordered Stop   03/30/18 1515  clindamycin (CLEOCIN) IVPB 300 mg     300 mg 100 mL/hr over 30 Minutes Intravenous Every 8 hours 03/30/18 1507     03/30/18 0732  vancomycin (VANCOCIN) IVPB 1000 mg/200 mL premix     1,000 mg 200 mL/hr over 60 Minutes Intravenous 60 min pre-op 03/30/18 0732 03/30/18 0935   03/30/18 0726  vancomycin (VANCOCIN) 1-5 GM/200ML-% IVPB    Note to Pharmacy:  Leandrew Koyanagi   : cabinet override      03/30/18 0726 03/30/18 1929   03/30/18 0725  vancomycin (VANCOCIN) 1-5 GM/200ML-% IVPB    Note to Pharmacy:  Leandrew Koyanagi   : cabinet override      03/30/18 0725 03/30/18 0835       V. Leia Alf, M.D. [facility] [place] Office: [phone] Pager:  [phone]

## 2018-03-31 NOTE — Discharge Summary (Signed)
AAA Discharge Summary    Sean Riley [birth year] 67 y.o. male  [id number]  Admission Date: 03/30/2018  Discharge Date: 03/31/18  Physician: Dr. Trula Slade  Admission Diagnosis: THORACIC AORTIC ANEURYSM  Discharge Day services:    see progress note 03/31/18 Physical Exam: Vitals:   03/31/18 0800 03/31/18 0803  BP:    Pulse:    Resp:    Temp: 98.4 F (36.9 C)   SpO2:  95%    Hospital Course:  The patient was admitted to the hospital and taken to the operating room on 03/30/2018 and underwent: Endovascular repair of descending thoracic aortic aneurysm by Dr. Trula Slade on 03/30/18.    The pt tolerated the procedure well and was transported to the PACU in good condition.  The remainder of the hospital course consisted of increasing mobilization and increasing intake of solids without difficulty.  Morning drawn labs due not demonstrate any change in renal function.  He is moving all extremities well and perfusing BLE.  He will follow up in office in 4 weeks with CTA chest/abd/pelvis.  He will be prescribed #10 tramadol 50mg  for continued post operative pain control.  Discharge instructions were reviewed with the patient and he voices his understanding.  He will be discharged this morning after ambulating and voiding to home in stable condition.  CBC    Component Value Date/Time   WBC 16.2 (H) 03/31/2018 0436   RBC 3.93 (L) 03/31/2018 0436   HGB 10.2 (L) 03/31/2018 0436   HCT 32.0 (L) 03/31/2018 0436   PLT 203 03/31/2018 0436   MCV 81.4 03/31/2018 0436   MCH 26.0 03/31/2018 0436   MCHC 31.9 03/31/2018 0436   RDW 16.1 (H) 03/31/2018 0436   LYMPHSABS 1.1 01/25/2018 1144   MONOABS 1.1 (H) 01/25/2018 1144   EOSABS 0.3 01/25/2018 1144   BASOSABS 0.0 01/25/2018 1144    BMET    Component Value Date/Time   NA 133 (L) 03/31/2018 0436   NA 132 (L) 04/28/2017 1032   K 4.5 03/31/2018 0436   CL 98 03/31/2018 0436   CO2 27 03/31/2018 0436   GLUCOSE 102 (H) 03/31/2018 0436   BUN 15 03/31/2018 0436   BUN 6 (L) 04/28/2017 1032   CREATININE 0.98 03/31/2018 0436   CALCIUM 8.3 (L) 03/31/2018 0436   GFRNONAA >60 03/31/2018 0436   GFRAA >60 03/31/2018 0436       Discharge Diagnosis:  THORACIC AORTIC ANEURYSM  Secondary Diagnosis: Patient Active Problem List   Diagnosis Date Noted  . Thoracic aortic aneurysm without rupture ([place]) 03/30/2018  . COPD (chronic obstructive pulmonary disease) ([place]) 10/06/2017  . Psoriasis 09/14/2017  . Depression 04/18/2017  . Pericardial effusion   . S/P aortic dissection repair 03/24/2017  . 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 Elevated PSA 01/11/2016  . PCP NOTES >>>>>>> 07/24/2015  . Gynecomastia, male 10/10/2014  . Allergic rhinitis 09/10/2014  . Dermatitis 05/11/2014  . Erectile dysfunction 05/10/2014  . HTN (hypertension) 10/28/2013  . Bronchospasm 10/28/2013  . ETOH abuse 10/28/2013  . Varicose veins of lower extremities with ulcer ([place]) 09/15/2011  . RBBB 11/14/2008  . Hyperlipidemia 03/09/2008  . tobacco use d/o 03/09/2008  . Atrial fibrillation ([place]) 03/09/2008  . GERD 03/09/2008  . Microscopic hematuria 03/09/2008  . BPH (benign prostatic hyperplasia) 03/09/2008   Past Medical History:  Diagnosis Date  . Allergy   . Arthritis   . Ascending aortic dissection ([place]) 03/23/2017  . Atrial fibrillation ([place])   . Cataract  removed both eyes   . GERD (gastroesophageal reflux disease)   . HYPERLIPIDEMIA   . 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 Hypertension   . Microscopic hematuria    negative cystoscopy  . NONSPECIFIC ABNORMAL ELECTROCARDIOGRAM   . Pleural effusion on right   . Pleural effusion, bilateral   . S/P aortic dissection repair 03/24/2017   Biological Bentall aortic root replacement + resection and grafting of entire ascending aorta, transverse aortic arch and proximal descending thoracic aorta with elephant trunk distal anastomosis and  debranching of aortic arch vessels  . 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 Varicose veins      Allergies as of 03/31/2018      Reactions   Simvastatin Other (See Comments)   Mental status changes   Celecoxib Other (See Comments)   GI UPSET AND INFLAMMATION   Codeine Other (See Comments)   GI UPSET AND INFLAMMATION   Nsaids Other (See Comments)   GI UPSET AND INFLAMMATION (can tolerate via IV)   Tape Other (See Comments)   Medical tape and Band-Aids PULL OFF THE SKIN; please use Coban wrap   Cephalexin Itching, Rash   Latex Rash      Medication List    TAKE these medications   albuterol 108 (90 Base) MCG/ACT inhaler Commonly known as:  PROVENTIL HFA;VENTOLIN HFA Inhale two puffs every four to six hours as needed for cough or wheeze. What changed:    how much to take  how to take this  when to take this  additional instructions   albuterol (2.5 MG/3ML) 0.083% nebulizer solution Commonly known as:  PROVENTIL Take 3 mLs (2.5 mg total) by nebulization every 8 (eight) hours as needed for wheezing or shortness of breath. What changed:  Another medication with the same name was changed. Make sure you understand how and when to take each.   ALPRAZolam 1 MG tablet Commonly known as:  XANAX Take 1-1.5 tablets (1-1.5 mg total) by mouth at bedtime.   aspirin EC 81 MG tablet Take 1 tablet (81 mg total) by mouth daily.   augmented betamethasone dipropionate 0.05 % cream Commonly known as:  DIPROLENE-AF Apply topically 2 (two) times daily as needed. What changed:    how much to take  reasons to take this   azelastine 0.1 % nasal spray Commonly known as:  ASTELIN Can use two sprays in each nostril twice daily as directed. What changed:    how much to take  how to take  this  when to take this  reasons to take this  additional instructions   azithromycin 250 MG tablet Commonly known as:  ZITHROMAX Take 2 pills today then one a day for 4 additional days   escitalopram 10 MG tablet Commonly known as:  LEXAPRO Take 1 tablet (10 mg total) by mouth daily.   esomeprazole 40 MG capsule Commonly known as:  NEXIUM Take 1 capsule (40 mg total) by mouth daily before breakfast.   flecainide 100 MG tablet Commonly known as:  TAMBOCOR Take 1 tablet (100 mg total) by mouth 2 (two) times daily.   fluticasone 50 MCG/ACT nasal spray Commonly known as:  FLONASE Use one spray in each nostril twice daily. What changed:    how much to take  how to  take this  when to take this  reasons to take this   fluticasone furoate-vilanterol 200-25 MCG/INH Aepb Commonly known as:  BREO ELLIPTA Inhale 1 puff into the lungs daily.   furosemide 40 MG tablet Commonly known as:  LASIX Take 1 tablet (40 mg total) by mouth daily.   INCRUSE ELLIPTA 62.5 MCG/INH Aepb Generic drug:  umeclidinium bromide Inhale 1 puff into the lungs daily.   metoprolol tartrate 25 MG tablet Commonly known as:  LOPRESSOR Take 2 tablets (50 mg total) by mouth 2 (two) times daily.   potassium chloride 10 MEQ tablet Commonly known as:  K-DUR Take 1 tablet (10 mEq total) by mouth daily.   predniSONE 10 MG tablet Commonly known as:  DELTASONE Take 4 tabs by mouth for 3 days, then 3 for 3 days, 2 for 3 days, 1 for 3 days and stop   traMADol 50 MG tablet Commonly known as:  ULTRAM Take 1 tablet (50 mg total) by mouth every 6 (six) hours as needed for moderate pain.       Instructions:  [facility] Discharge Instructions Open Aortic Surgery  Please refer to the following instructions for your post-procedure care. Your surgeon or Physician Assistant will discuss any changes with you.  Activity  Avoid lifting more than eight pounds (a gallon of  milk) until after your first post-operative visit. You are encouraged to walk as much as you can. You can slowly return to normal activities but must avoid strenuous activity and heavy lifting until your doctor tells you it's OK. Heavy lifting can hurt the incision and cause a hernia. Avoid activities such as vacuuming or swinging a golf club. It is normal to feel tired for several weeks after your surgery. Do not drive until your doctor gives the OK and you are no longer taking prescription pain medications. It is also normal to have difficulty with sleep habits, eating and bowl movements after surgery. These will go away with time.  Bathing/Showering  You may shower after you go home. Do not soak in a bathtub, hot tub, or swim until the incision heals.  Incision Care  Shower every day. Clean your incision with mild soap and water. Pat the area dry with a clean towel. You do not need a bandage unless otherwise instructed. Do not apply any ointments or creams to your incision. You may have skin glue on your incision. Do not peel it off. It will come off on its own in about one week. If you have staples or sutures along your incision, they will be removed at your post op appointment.  If you have groin incisions, wash the groin wounds with soap and water daily and pat dry. (No tub bath-only shower)  Then put a dry gauze or washcloth in the groin to keep this area dry to help prevent wound infection.  Do this daily and as needed.  Do not use Vaseline or neosporin on your incisions.  Only use soap and water on your incisions and then protect and keep dry.  Diet  Resume your normal diet. There are no special food restriction following this procedure. A low fat/low cholesterol diet is recommended for all patients with vascular disease. After your aortic surgery, it's normal to feel full faster than usual and to not feel as hungry as you normally would. You will probably lose weight initially following your  surgery. It's best to eat small, frequent meals over the course of the day.  Call the office if you find that you are unable to eat even small meals. In order to heal from your surgery, it is CRITICAL to get adequate nutrition. Your body requires vitamins, minerals, and protein. Vegetables are the best source of vitamins and minerals. causing pain, you may take over-the-counter pain reliever such as acetaminophen (Tylenol). If you were prescribed a stronger pain medication, please be aware these medication can cause nausea and constipation. Prevent nausea by taking the medication with a snack or meal. Avoid constipation by drinking plenty of fluids and eating foods with a high amount of fiber, such as fruits, vegetables and grains. Take 100mg  of the over-the-counter stool softener Colace twice a day as needed to help with constipation. A laxative, such as Milk of Magnesia, may be recommended for you at this time. Do not take a laxative unless your surgeon or Physician Assistant. tells you it's OK. Do not take Tylenol if you are taking stronger pain medications (such as Percocet).  Follow Up  Our office will schedule a follow up appointment 2-3 weeks after discharge.  Please call us immediately for any of the following conditions    .     Severe or worsening pain in your legs or feet or in your abdomen back or chest. Increased pain, redness drainage (pus) from your incision site. Increased abdominal pain, bloating, nausea, vomiting, or persistent diarrhea. Fever of 101 degrees or higher. Swelling in your leg (s).  Reduce your risk of vascular disease  Stop smoking. If you would like help, call [facility] at 1-800-QUIT-NOW (956)447-7027) or [place] at (843)356-2859. Manage your cholesterol Maintain a desired weight Control your diabetes Keep your blood pressure down  If you have any questions please call the office at [phone].   Disposition: home  Patient's condition: is  Good  Follow up: 1. Dr. Trula Slade in 2 weeks   Dagoberto Ligas, PA-C [facility] [phone] 03/31/2018  11:36 AM   - For VQI Registry use -  Post-op:  Time to Extubation: [x]  In OR, [ ]  < 12 hrs, [ ]  12-24 hrs, [ ]  >=24 hrs Vasopressors Req. Post-op: No ICU Stay: none Transfusion: No   If yes,  units given MI: No, [ ]  Troponin only, [ ]  EKG or Clinical New Arrhythmia: No  Complications: CHF: No Resp failure: No, [ ]  Pneumonia, [ ]  Ventilator Chg in renal function: No, [ ]  Inc. Cr > 0.5, [ ]  Temp. Dialysis, [ ]  Permanent dialysis Leg ischemia: No, no Surgery needed, [ ]  Yes, Surgery needed, [ ]  Amputation Bowel ischemia: No, [ ]  Medical Rx, [ ]  Surgical Rx Wound complication: No, [ ]  Superficial separation/infection, [ ]  Return to OR Return to OR: No  Return to OR for bleeding: No Stroke: No, [ ]  Minor, [ ]  Major  Discharge medications: Statin use:  No If No:  allergy ASA use:  Yes  If No:   Plavix use:  No  Beta blocker use:  Yes  ACEI use:  No ARB use:  No CCB use:  No Coumadin use:  No

## 2018-04-01 DIAGNOSIS — J309 Allergic rhinitis, unspecified: Secondary | ICD-10-CM | POA: Diagnosis not present

## 2018-04-01 DIAGNOSIS — Z48812 Encounter for surgical aftercare following surgery on the circulatory system: Secondary | ICD-10-CM | POA: Diagnosis not present

## 2018-04-01 DIAGNOSIS — J439 Emphysema, unspecified: Secondary | ICD-10-CM | POA: Diagnosis not present

## 2018-04-01 DIAGNOSIS — I4891 Unspecified atrial fibrillation: Secondary | ICD-10-CM | POA: Diagnosis not present

## 2018-04-01 DIAGNOSIS — I716 Thoracoabdominal aortic aneurysm, without rupture: Secondary | ICD-10-CM | POA: Diagnosis not present

## 2018-04-01 DIAGNOSIS — I10 Essential (primary) hypertension: Secondary | ICD-10-CM | POA: Diagnosis not present

## 2018-04-01 LAB — TYPE AND SCREEN
ABO/RH(D): O NEG
Antibody Screen: POSITIVE
DAT, IgG: NEGATIVE
DONOR AG TYPE: NEGATIVE
Donor AG Type: NEGATIVE
PT AG Type: NEGATIVE
UNIT DIVISION: 0
UNIT DIVISION: 0
Unit division: 0

## 2018-04-01 LAB — BPAM RBC
Blood Product Expiration Date: 201909072359
Blood Product Expiration Date: 201909112359
Blood Product Expiration Date: 201909142359
ISSUE DATE / TIME: 201908221131
UNIT TYPE AND RH: 9500
Unit Type and Rh: 9500
Unit Type and Rh: 9500

## 2018-04-04 ENCOUNTER — Encounter (HOSPITAL_COMMUNITY): Payer: Self-pay | Admitting: Surgery

## 2018-04-05 ENCOUNTER — Other Ambulatory Visit: Payer: Self-pay

## 2018-04-05 DIAGNOSIS — I712 Thoracic aortic aneurysm, without rupture, unspecified: Secondary | ICD-10-CM

## 2018-04-05 DIAGNOSIS — Z48812 Encounter for surgical aftercare following surgery on the circulatory system: Secondary | ICD-10-CM | POA: Diagnosis not present

## 2018-04-05 DIAGNOSIS — J439 Emphysema, unspecified: Secondary | ICD-10-CM | POA: Diagnosis not present

## 2018-04-05 DIAGNOSIS — I10 Essential (primary) hypertension: Secondary | ICD-10-CM | POA: Diagnosis not present

## 2018-04-05 DIAGNOSIS — J309 Allergic rhinitis, unspecified: Secondary | ICD-10-CM | POA: Diagnosis not present

## 2018-04-05 DIAGNOSIS — I716 Thoracoabdominal aortic aneurysm, without rupture: Secondary | ICD-10-CM | POA: Diagnosis not present

## 2018-04-05 DIAGNOSIS — I4891 Unspecified atrial fibrillation: Secondary | ICD-10-CM | POA: Diagnosis not present

## 2018-04-13 DIAGNOSIS — J439 Emphysema, unspecified: Secondary | ICD-10-CM | POA: Diagnosis not present

## 2018-04-13 DIAGNOSIS — I10 Essential (primary) hypertension: Secondary | ICD-10-CM | POA: Diagnosis not present

## 2018-04-13 DIAGNOSIS — J309 Allergic rhinitis, unspecified: Secondary | ICD-10-CM | POA: Diagnosis not present

## 2018-04-13 DIAGNOSIS — Z48812 Encounter for surgical aftercare following surgery on the circulatory system: Secondary | ICD-10-CM | POA: Diagnosis not present

## 2018-04-13 DIAGNOSIS — I4891 Unspecified atrial fibrillation: Secondary | ICD-10-CM | POA: Diagnosis not present

## 2018-04-13 DIAGNOSIS — I716 Thoracoabdominal aortic aneurysm, without rupture: Secondary | ICD-10-CM | POA: Diagnosis not present

## 2018-04-18 ENCOUNTER — Encounter: Payer: Self-pay | Admitting: Internal Medicine

## 2018-04-18 ENCOUNTER — Ambulatory Visit (INDEPENDENT_AMBULATORY_CARE_PROVIDER_SITE_OTHER): Payer: Medicare Other | Admitting: Internal Medicine

## 2018-04-18 VITALS — BP 108/60 | HR 57 | Temp 97.7°F | Resp 14 | Ht 72.0 in | Wt 188.1 lb

## 2018-04-18 DIAGNOSIS — I10 Essential (primary) hypertension: Secondary | ICD-10-CM

## 2018-04-18 DIAGNOSIS — Z79899 Other long term (current) drug therapy: Secondary | ICD-10-CM

## 2018-04-18 DIAGNOSIS — J449 Chronic obstructive pulmonary disease, unspecified: Secondary | ICD-10-CM | POA: Diagnosis not present

## 2018-04-18 DIAGNOSIS — F419 Anxiety disorder, unspecified: Secondary | ICD-10-CM | POA: Diagnosis not present

## 2018-04-18 DIAGNOSIS — I480 Paroxysmal atrial fibrillation: Secondary | ICD-10-CM | POA: Diagnosis not present

## 2018-04-18 DIAGNOSIS — I6529 Occlusion and stenosis of unspecified carotid artery: Secondary | ICD-10-CM

## 2018-04-18 LAB — CBC WITH DIFFERENTIAL/PLATELET
BASOS PCT: 1.6 % (ref 0.0–3.0)
Basophils Absolute: 0.1 10*3/uL (ref 0.0–0.1)
EOS ABS: 0.2 10*3/uL (ref 0.0–0.7)
Eosinophils Relative: 3.5 % (ref 0.0–5.0)
HCT: 33.6 % — ABNORMAL LOW (ref 39.0–52.0)
Hemoglobin: 10.9 g/dL — ABNORMAL LOW (ref 13.0–17.0)
Lymphocytes Relative: 14.5 % (ref 12.0–46.0)
Lymphs Abs: 0.8 10*3/uL (ref 0.7–4.0)
MCHC: 32.6 g/dL (ref 30.0–36.0)
MCV: 79.9 fl (ref 78.0–100.0)
MONO ABS: 0.8 10*3/uL (ref 0.1–1.0)
Monocytes Relative: 13.6 % — ABNORMAL HIGH (ref 3.0–12.0)
NEUTROS ABS: 3.8 10*3/uL (ref 1.4–7.7)
Neutrophils Relative %: 66.8 % (ref 43.0–77.0)
PLATELETS: 385 10*3/uL (ref 150.0–400.0)
RBC: 4.2 Mil/uL — ABNORMAL LOW (ref 4.22–5.81)
RDW: 16.5 % — AB (ref 11.5–15.5)
WBC: 5.7 10*3/uL (ref 4.0–10.5)

## 2018-04-18 LAB — BASIC METABOLIC PANEL
BUN: 11 mg/dL (ref 6–23)
CO2: 30 mEq/L (ref 19–32)
CREATININE: 0.96 mg/dL (ref 0.40–1.50)
Calcium: 9 mg/dL (ref 8.4–10.5)
Chloride: 92 mEq/L — ABNORMAL LOW (ref 96–112)
GFR: 82.69 mL/min (ref >60.00)
GLUCOSE: 77 mg/dL (ref 70–99)
POTASSIUM: 4.8 meq/L (ref 3.5–5.1)
SODIUM: 126 meq/L — AB (ref 135–145)

## 2018-04-18 LAB — MAGNESIUM: MAGNESIUM: 2 mg/dL (ref 1.5–2.5)

## 2018-04-18 NOTE — Assessment & Plan Note (Signed)
  HTN: Currently on Lasix, metoprolol, K. Dur.  BP and heart rate in the low side.  See comments under atrial fibrillation.  Will check a BMP, CBC and magnesium.  A. fib: Saw cardiology 12/20/2017, he was noted to be bradycardic due to use of beta-blockers to protect the aneurysm dilatation.  They were considering decreased beta-blockers at some point after aneurysm was addressed w/ a procedure.  COPD.  Saw pulmonary last month, chest x-ray showed possible pneumonia, had a Z-Pak.  Currently doing well with no fever or chills. Aneurysm, thorax: Status post endovascular repair, seems to be doing very well, checking labs. EtOH: Has decreased consumption substantially. Insomnia: Xanax, needs a drug screen today.  Contract signed. Allergies: On Benadryl as needed, we talk about possibly change to Claritin or others, prefers to stay on Benadryl, he take it at night with no apparent side effects. RTC 4 months

## 2018-04-18 NOTE — Progress Notes (Signed)
Pre visit review using our clinic review tool, if applicable. No additional management support is needed unless otherwise documented below in the visit note. 

## 2018-04-18 NOTE — Patient Instructions (Addendum)
GO TO THE LAB : Get the blood work     GO TO THE FRONT DESK Schedule your next appointment for a  Check up in 4 months   

## 2018-04-18 NOTE — Progress Notes (Signed)
Subjective:    Patient ID: Sean Riley, male    DOB: August 20, 1950, 67 y.o.   MRN: [id number]  DOS:  04/18/2018 Type of visit - description : rov Interval history: HTN: Good compliance w/  medication, no apparent side effects. He had a thoracic aneurysm repair recently, took tramadol only once,since then he reports no pain or difficulties. EtOH: Has cut  down significantly COPD: Good compliance with medication, feels well.  Review of Systems Good compliance with medication, reports some allergies for which he takes Benadryl. Denies fever chills No recent chest pain no difficulty breathing No nausea, vomiting, diarrhea  Past Medical History:  Diagnosis Date  . Allergy   . Arthritis   . Ascending aortic dissection ([place]) 03/23/2017  . Atrial fibrillation ([place])   . Cataract    removed both eyes   . GERD (gastroesophageal reflux disease)   . HYPERLIPIDEMIA   . 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 Hypertension   . Microscopic hematuria    negative cystoscopy  . NONSPECIFIC ABNORMAL ELECTROCARDIOGRAM   . Pleural effusion on right   . Pleural effusion, bilateral   . S/P aortic dissection repair 03/24/2017   Biological Bentall aortic root replacement + resection and grafting of entire ascending aorta, transverse aortic arch and proximal descending thoracic aorta with elephant trunk distal anastomosis and debranching of aortic arch vessels  . 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 Varicose veins     Past Surgical History:  Procedure Laterality Date  . APPENDECTOMY    . CARDIAC CATHETERIZATION      X3;Dr Caryl Comes, last 01/20/12: mild non-obstructive CAD, normal LV systolic function  . COLONOSCOPY    . CYSTOSCOPY  1978   Dr Hartley Barefoot  . EYE SURGERY    . IR THORACENTESIS  ASP PLEURAL SPACE W/IMG GUIDE  11/25/2017  . KNEE ARTHROSCOPY  2012   Dr Theda Sers  . LUMBAR LAMINECTOMY  1987   Dr Durward Fortes  . MYELOGRAM  2007  . PERICARDIOCENTESIS N/A 04/07/2017   Procedure: PERICARDIOCENTESIS;  Surgeon: Sherren Mocha, MD;  Location: [facility];  Service: Cardiovascular;  Laterality: N/A;  . POLYPECTOMY    . REPAIR OF ACUTE ASCENDING THORACIC AORTIC DISSECTION N/A 03/23/2017   Procedure: REPAIR OF ACUTE ASCENDING THORACIC AORTIC DISSECTION.  Bentall procedure.  Aortic root repleacement with bioprosthetic valve.  Reimplantation of left and right coronary arteries.  Total resection of transverse aortic arch.  Elephant trunk distal anastomosis and debranching of arch vessels.;  Surgeon: Rexene Alberts, MD;  Location: [facility];  Service: Vascular;  Laterality: N/A;  . ROTATOR CUFF REPAIR    . SHOULDER ARTHROSCOPY  2011   Dr Theda Sers  . SHOULDER ARTHROSCOPY Right 08/2015  . SPINAL FUSION  1986   Dr Rolin Barry  . TEE WITHOUT CARDIOVERSION N/A 03/23/2017   Procedure: TRANSESOPHAGEAL ECHOCARDIOGRAM (TEE);  Surgeon: Rexene Alberts, MD;  Location: [place];  Service: Open Heart Surgery;  Laterality: N/A;  . THORACIC AORTIC ENDOVASCULAR STENT GRAFT N/A 03/30/2018   Procedure: THORACIC AORTIC ENDOVASCULAR STENT GRAFT WITH INTRAVASCULAR ULTRASOUND;  Surgeon: Serafina Mitchell, MD;  Location: [facility];  Service: Vascular;  Laterality: N/A;  . TRACHEOSTOMY     age 24 for croup    Social History   Socioeconomic History  .  Marital status: Married    Spouse name: Not on file  . Number of children: 2  . Years of education: Not on file  . Highest education level: Not on file  Occupational History  . 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 Financial resource strain: Not on file  . Food insecurity:    Worry: Not on file    Inability: Not on file  . Transportation needs:    Medical: Not on file    Non-medical: Not on file  Tobacco Use  . Smoking status: Former Smoker      Packs/day: 2.00    Years: 45.00    Pack years: 90.00    Types: Cigarettes    Last attempt to quit: 03/22/2017    Years since quitting: 1.0  . Smokeless tobacco: Never Used  . Tobacco comment: 2 ppd , quit 03/2017  Substance and Sexual Activity  . Alcohol use: Yes    Alcohol/week: 0.0 standard drinks    Comment: 5 cans of beer daily  . Drug use: No  . Sexual activity: Never  Lifestyle  . Physical activity:    Days per week: Not on file    Minutes per session: Not on file  . Stress: Not on file  Relationships  . Social connections:    Talks on phone: Not on file    Gets together: Not on file    Attends religious service: Not on file    Active member of club or organization: Not on file    Attends meetings of clubs or organizations: Not on file    Relationship status: Not on file  . Intimate partner violence:    Fear of current or ex partner: Not on file    Emotionally abused: Not on file    Physically abused: Not on file    Forced sexual activity: Not on file  Other Topics Concern  . Not on file  Social History Narrative   Lives w/ wife      Allergies as of 04/18/2018      Reactions   Simvastatin Other (See Comments)   Mental status changes   Celecoxib Other (See Comments)   GI UPSET AND INFLAMMATION   Codeine Other (See Comments)   GI UPSET AND INFLAMMATION   Nsaids Other (See Comments)   GI UPSET AND INFLAMMATION (can tolerate via IV)   Tape Other (See Comments)   Medical tape and Band-Aids PULL OFF THE SKIN; please use Coban wrap   Cephalexin Itching, Rash   Latex Rash      Medication List        Accurate as of 04/18/18  8:42 PM. Always use your most recent med list.          albuterol 108 (90 Base) MCG/ACT inhaler Commonly known as:  PROVENTIL HFA;VENTOLIN HFA Inhale two puffs every four to six hours as needed for cough or wheeze.   albuterol (2.5 MG/3ML) 0.083% nebulizer solution Commonly known as:  PROVENTIL Take 3 mLs (2.5 mg total) by  nebulization every 8 (eight) hours as needed for wheezing or shortness of breath.   ALPRAZolam 1 MG tablet Commonly known as:  XANAX Take 1-1.5 tablets (1-1.5 mg total) by mouth at bedtime.   aspirin EC 81 MG tablet Take 1 tablet (81 mg total) by mouth daily.   augmented betamethasone dipropionate 0.05 % cream Commonly known as:  DIPROLENE-AF Apply topically 2 (two) times daily as needed.   azelastine 0.1 % nasal  spray Commonly known as:  ASTELIN Can use two sprays in each nostril twice daily as directed.   diphenhydrAMINE 25 MG tablet Commonly known as:  BENADRYL Take 25 mg by mouth at bedtime as needed.   escitalopram 10 MG tablet Commonly known as:  LEXAPRO Take 1 tablet (10 mg total) by mouth daily.   esomeprazole 40 MG capsule Commonly known as:  NEXIUM Take 1 capsule (40 mg total) by mouth daily before breakfast.   flecainide 100 MG tablet Commonly known as:  TAMBOCOR Take 1 tablet (100 mg total) by mouth 2 (two) times daily.   fluticasone 50 MCG/ACT nasal spray Commonly known as:  FLONASE Use one spray in each nostril twice daily.   fluticasone furoate-vilanterol 200-25 MCG/INH Aepb Commonly known as:  BREO ELLIPTA Inhale 1 puff into the lungs daily.   furosemide 40 MG tablet Commonly known as:  LASIX Take 1 tablet (40 mg total) by mouth daily.   INCRUSE ELLIPTA 62.5 MCG/INH Aepb Generic drug:  umeclidinium bromide Inhale 1 puff into the lungs daily.   metoprolol tartrate 25 MG tablet Commonly known as:  LOPRESSOR Take 2 tablets (50 mg total) by mouth 2 (two) times daily.   potassium chloride 10 MEQ tablet Commonly known as:  K-DUR Take 1 tablet (10 mEq total) by mouth daily.   traMADol 50 MG tablet Commonly known as:  ULTRAM Take 1 tablet (50 mg total) by mouth every 6 (six) hours as needed for moderate pain.          Objective:   Physical Exam BP 108/60 (BP Location: Left Arm, Patient Position: Sitting, Cuff Size: Small)   Pulse (!) 57    Temp 97.7 F (36.5 C) (Oral)   Resp 14   Ht 6' (1.829 m)   Wt 188 lb 2 oz (85.3 kg)   SpO2 94%   BMI 25.51 kg/m  General:   Well developed, NAD, see BMI.  HEENT:  Normocephalic . Face symmetric, atraumatic Lungs:  Slightly decreased breath sounds but otherwise normal. Normal respiratory effort, no intercostal retractions, no accessory muscle use. Heart: RRR,  + syst murmur.  no pretibial edema bilaterally  Abdomen:  Not distended, soft, non-tender. No rebound or rigidity.  No mass, no bruit Skin: Not pale. Not jaundice Neurologic:  alert & oriented X3.  Speech normal, gait appropriate for age and unassisted Psych--  Cognition and judgment appear intact.  Cooperative with normal attention span and concentration.  Behavior appropriate. No anxious or depressed appearing.     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 Tried a cpap. [place] (-) 07-2015 CV: --P- Atrial fibrillation Dr Caryl Comes, on NSR, no anticoag --lexiscan 2013 --Cardiac catheterization July 2013: Normal LV FX,, mild nonobstructive CAD Dr. Angelena Form --thoracis Ao  dissection , acute, surgery 03-2017 --Aortic thoracic aneurysm endovascular stent graft 03-2018 GU:  --BPH, microscopic hematuria (negative cystoscopy 1978) --Elevated PSA: saw urology  04-2016 Gynecomastia, Dr. Loanne Drilling  >> Bx (-) 10-2014 ; took tamoxifen temporarily (self d/c 06-2016), improved Hypogonadism-- dx per Dr Loanne Drilling, numbers improved d/t nolvadex  Sees dermatology : eczema? rx taclonex H/o  Tracheostomy , age 60, croup  PLAN: HTN: Currently on Lasix, metoprolol, K. Dur.  BP and heart rate in the low side.  See comments under atrial fibrillation.  Will  check a BMP, CBC and magnesium.  A. fib: Saw cardiology  12/20/2017, he was noted to be bradycardic due to use of beta-blockers to protect the aneurysm dilatation.  They were considering decreased beta-blockers at some point after aneurysm was addressed w/ a procedure.  COPD.  Saw pulmonary last month, chest x-ray showed possible pneumonia, had a Z-Pak.  Currently doing well with no fever or chills. Aneurysm, thorax: Status post endovascular repair, seems to be doing very well, checking labs. EtOH: Has decreased consumption substantially. Insomnia: Xanax, needs a drug screen today.  Contract signed. Allergies: On Benadryl as needed, we talk about possibly change to Claritin or others, prefers to stay on Benadryl, he take it at night with no apparent side effects. RTC 4 months

## 2018-04-20 ENCOUNTER — Other Ambulatory Visit: Payer: Self-pay | Admitting: Internal Medicine

## 2018-04-20 DIAGNOSIS — E871 Hypo-osmolality and hyponatremia: Secondary | ICD-10-CM

## 2018-04-21 LAB — DRUG TOX MONITOR 1 W/CONF, ORAL FLD
AMPHETAMINES: NEGATIVE ng/mL (ref <10)
Alprazolam: 6.18 ng/mL — ABNORMAL HIGH (ref <0.50)
BARBITURATES: NEGATIVE ng/mL (ref <10)
BENZODIAZEPINES: POSITIVE ng/mL — AB (ref <0.50)
BUPRENORPHINE: NEGATIVE ng/mL (ref <0.10)
COCAINE: NEGATIVE ng/mL (ref <5.0)
Chlordiazepoxide: NEGATIVE ng/mL (ref <0.50)
Clonazepam: NEGATIVE ng/mL (ref <0.50)
DIAZEPAM: NEGATIVE ng/mL (ref <0.50)
FLUNITRAZEPAM: NEGATIVE ng/mL (ref <0.50)
FLURAZEPAM: NEGATIVE ng/mL (ref <0.50)
Fentanyl: NEGATIVE ng/mL (ref <0.10)
Heroin Metabolite: NEGATIVE ng/mL (ref <1.0)
Lorazepam: NEGATIVE ng/mL (ref <0.50)
MARIJUANA: NEGATIVE ng/mL (ref <2.5)
MDMA: NEGATIVE ng/mL (ref <10)
METHADONE: NEGATIVE ng/mL (ref <5.0)
Meprobamate: NEGATIVE ng/mL (ref <2.5)
Midazolam: NEGATIVE ng/mL (ref <0.50)
Nicotine Metabolite: NEGATIVE ng/mL (ref <5.0)
Nordiazepam: NEGATIVE ng/mL (ref <0.50)
OPIATES: NEGATIVE ng/mL (ref <2.5)
OXAZEPAM: NEGATIVE ng/mL (ref <0.50)
PHENCYCLIDINE: NEGATIVE ng/mL (ref <10)
TRIAZOLAM: NEGATIVE ng/mL (ref <0.50)
Tapentadol: NEGATIVE ng/mL (ref <5.0)
Temazepam: NEGATIVE ng/mL (ref <0.50)
Tramadol: NEGATIVE ng/mL (ref <5.0)
Zolpidem: NEGATIVE ng/mL (ref <5.0)

## 2018-04-25 ENCOUNTER — Ambulatory Visit (INDEPENDENT_AMBULATORY_CARE_PROVIDER_SITE_OTHER): Payer: Medicare Other | Admitting: Surgery

## 2018-04-25 ENCOUNTER — Encounter: Payer: Self-pay | Admitting: Surgery

## 2018-04-25 ENCOUNTER — Ambulatory Visit
Admission: RE | Admit: 2018-04-25 | Discharge: 2018-04-25 | Disposition: A | Discharge status: Home / Self Care | Payer: Medicare Other | Source: Ambulatory Visit | Attending: Surgery | Admitting: Surgery

## 2018-04-25 ENCOUNTER — Other Ambulatory Visit: Payer: Self-pay

## 2018-04-25 ENCOUNTER — Ambulatory Visit: Payer: Medicare Other | Admitting: Internal Medicine

## 2018-04-25 VITALS — BP 140/62 | HR 60 | Temp 98.1°F | Resp 16 | Ht 72.0 in | Wt 190.0 lb

## 2018-04-25 DIAGNOSIS — I712 Thoracic aortic aneurysm, without rupture, unspecified: Secondary | ICD-10-CM

## 2018-04-25 DIAGNOSIS — I714 Abdominal aortic aneurysm, without rupture: Secondary | ICD-10-CM | POA: Diagnosis not present

## 2018-04-25 MED ORDER — IOPAMIDOL (ISOVUE-370) INJECTION 76%
100.0000 mL | Freq: Once | INTRAVENOUS | Status: AC | PRN
  Administered 2018-04-25: 100 mL via INTRAVENOUS

## 2018-04-25 NOTE — Progress Notes (Signed)
Patient name: Sean Riley MRN: [id number] DOB: 07-17-1951 Sex: male  REASON FOR VISIT:     post op  HISTORY OF PRESENT ILLNESS:   Sean Riley is a 67 y.o. 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  He underwent complete aortic arch replacement with elephant trunk as well as bioprosthetic tissue valve.  He had progressive enlargement of the descending thoracic aorta with persistent dissection and underwent endovascular repair on 03/30/2018.  His postoperative course was uncomplicated.  He has no complaints today  CURRENT MEDICATIONS:    Current Outpatient Medications  Medication Sig Dispense Refill  . albuterol (PROAIR HFA) 108 (90 Base) MCG/ACT inhaler Inhale two puffs every four to six hours as needed for cough or wheeze. (Patient taking differently: Inhale 2 puffs into the lungs See admin instructions. Every 4-6 hours as needed for coughing or wheezing) 3 Inhaler 0  . albuterol (PROVENTIL) (2.5 MG/3ML) 0.083% nebulizer solution Take 3 mLs (2.5 mg total) by nebulization every 8 (eight) hours as needed for wheezing or shortness of breath. 150 mL 1  . ALPRAZolam (XANAX) 1 MG tablet Take 1-1.5 tablets (1-1.5 mg total) by mouth at bedtime. 40 tablet 3  . aspirin EC 81 MG tablet Take 1 tablet (81 mg total) by mouth daily.    Marland Kitchen augmented betamethasone dipropionate (DIPROLENE-AF) 0.05 % cream Apply topically 2 (two) times daily as needed. (Patient taking differently: Apply 1 application topically 2 (two) times daily as needed (for itchy skin). ) 100 g 3  . azelastine (ASTELIN) 0.1 % nasal spray Can use two sprays in each nostril twice daily as directed. (Patient taking differently: Place 2 sprays into both nostrils 2 (two) times daily as needed for allergies. ) 90 mL 3  . diphenhydrAMINE (BENADRYL) 25 MG tablet Take 25 mg by mouth at bedtime as needed.    Marland Kitchen escitalopram (LEXAPRO) 10 MG tablet Take  1 tablet (10 mg total) by mouth daily. 30 tablet 5  . esomeprazole (NEXIUM) 40 MG capsule Take 1 capsule (40 mg total) by mouth daily before breakfast. 90 capsule 3  . flecainide (TAMBOCOR) 100 MG tablet Take 1 tablet (100 mg total) by mouth 2 (two) times daily. 180 tablet 3  . fluticasone (FLONASE) 50 MCG/ACT nasal spray Use one spray in each nostril twice daily. (Patient taking differently: Place 1 spray into both nostrils 2 (two) times daily as needed for allergies. Use one spray in each nostril twice daily.) 48 g 3  . fluticasone furoate-vilanterol (BREO ELLIPTA) 200-25 MCG/INH AEPB Inhale 1 puff into the lungs daily. 180 each 3  . furosemide (LASIX) 40 MG tablet Take 1 tablet (40 mg total) by mouth daily. 30 tablet 5  . metoprolol tartrate (LOPRESSOR) 25 MG tablet Take 2 tablets (50 mg total) by mouth 2 (two) times daily. 180 tablet 3  . potassium chloride (K-DUR) 10 MEQ tablet Take 1 tablet (10 mEq total) by mouth daily. 30 tablet 5  . traMADol (ULTRAM) 50 MG tablet Take 1 tablet (50 mg total) by mouth every 6 (six) hours as needed for moderate pain. 10 tablet 0  . umeclidinium bromide (INCRUSE ELLIPTA) 62.5 MCG/INH AEPB Inhale 1 puff into the lungs daily.     No current facility-administered medications for this visit.     REVIEW OF SYSTEMS:   [X]  denotes positive finding, [ ]  denotes negative finding Cardiac  Comments:  Chest pain or chest pressure:    Shortness of  breath upon exertion:    Short of breath when lying flat:    Irregular heart rhythm:    Constitutional    Fever or chills:      PHYSICAL EXAM:   Vitals:   04/25/18 1540  BP: 140/62  Pulse: 60  Resp: 16  Temp: 98.1 F (36.7 C)  TempSrc: Oral  SpO2: 97%  Weight: 190 lb (86.2 kg)  Height: 6' (1.829 m)    GENERAL: The patient is a well-nourished male, in no acute distress. The vital signs are documented above. CARDIOVASCULAR: There is a regular rate and rhythm. PULMONARY: Non-labored respirations Groin looks  good  STUDIES:   CTA  Status post surgical repair of aortic root and ascending thoracic aorta as noted on prior exam, with surgical reimplantation of great vessels into anterior thoracic aorta. There is been interval stent graft placement involving the transverse thoracic aortic arch and descending thoracic aorta which extends to the aortic hiatus. Dissection of distal descending thoracic aorta is again noted and not significantly changed. Excluded aneurysmal sac of proximal descending thoracic aorta measures 5.2 cm which is unchanged.  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 Dissection for thoracic aorta extends throughout abdominal aorta to the aortic bifurcation and extends into the origin of the left common iliac artery. No significant mesenteric or renal artery stenosis is noted.  Bilateral pleural effusions are noted, right greater than left, with associated subsegmental atelectasis.  MEDICAL ISSUES:   F/u 6 months with CTA c/a/p  Annamarie Major, MD [facility] [phone] Pager [phone]

## 2018-04-27 ENCOUNTER — Other Ambulatory Visit (INDEPENDENT_AMBULATORY_CARE_PROVIDER_SITE_OTHER): Payer: Medicare Other

## 2018-04-27 DIAGNOSIS — E871 Hypo-osmolality and hyponatremia: Secondary | ICD-10-CM

## 2018-04-27 LAB — BASIC METABOLIC PANEL
BUN: 12 mg/dL (ref 6–23)
CALCIUM: 8.9 mg/dL (ref 8.4–10.5)
CO2: 30 meq/L (ref 19–32)
Chloride: 94 mEq/L — ABNORMAL LOW (ref 96–112)
Creatinine, Ser: 0.92 mg/dL (ref 0.40–1.50)
GFR: 86.85 mL/min (ref >60.00)
Glucose, Bld: 87 mg/dL (ref 70–99)
Potassium: 4.4 mEq/L (ref 3.5–5.1)
SODIUM: 129 meq/L — AB (ref 135–145)

## 2018-04-28 ENCOUNTER — Other Ambulatory Visit: Payer: Self-pay | Admitting: *Deleted

## 2018-04-28 DIAGNOSIS — I71019 Dissection of thoracic aorta, unspecified: Secondary | ICD-10-CM

## 2018-04-28 DIAGNOSIS — I7101 Dissection of thoracic aorta: Secondary | ICD-10-CM

## 2018-04-28 NOTE — Addendum Note (Signed)
Addended byDamita Dunnings D on: 04/28/2018 04:50 PM   Modules accepted: Orders

## 2018-05-02 ENCOUNTER — Encounter

## 2018-05-02 ENCOUNTER — Ambulatory Visit (INDEPENDENT_AMBULATORY_CARE_PROVIDER_SITE_OTHER): Payer: Medicare Other | Admitting: Internal Medicine

## 2018-05-02 ENCOUNTER — Encounter: Payer: Self-pay | Admitting: Internal Medicine

## 2018-05-02 VITALS — BP 124/82 | HR 87 | Ht 72.0 in | Wt 195.0 lb

## 2018-05-02 DIAGNOSIS — I5030 Unspecified diastolic (congestive) heart failure: Secondary | ICD-10-CM

## 2018-05-02 DIAGNOSIS — R600 Localized edema: Secondary | ICD-10-CM

## 2018-05-02 DIAGNOSIS — I480 Paroxysmal atrial fibrillation: Secondary | ICD-10-CM | POA: Diagnosis not present

## 2018-05-02 DIAGNOSIS — I7103 Dissection of thoracoabdominal aorta: Secondary | ICD-10-CM

## 2018-05-02 DIAGNOSIS — I6529 Occlusion and stenosis of unspecified carotid artery: Secondary | ICD-10-CM

## 2018-05-02 MED ORDER — FUROSEMIDE 40 MG PO TABS: 40.0000 mg | ORAL_TABLET | Freq: Every day | ORAL | 5 refills | Status: DC

## 2018-05-02 MED ORDER — POTASSIUM CHLORIDE ER 10 MEQ PO TBCR: 10.0000 meq | EXTENDED_RELEASE_TABLET | Freq: Every day | ORAL | 5 refills | Status: DC

## 2018-05-02 NOTE — Progress Notes (Signed)
Patient Care Team: Colon Branch, MD as PCP - General (Internal Medicine) Deboraha Sprang, MD as Consulting Physician (Cardiology) Darleen Crocker, MD as Consulting Physician (Ophthalmology) Loletha Carrow Kirke Corin, MD as Consulting Physician (Gastroenterology) Neldon Mc Donnamarie Poag, MD as Consulting Physician (Allergy and Immunology) Rexene Alberts, MD as Consulting Physician (Cardiothoracic Surgery)   HPI  Sean Riley is a 67 y.o. male Seen With Chief Complaint of  atrial fibrillation. He continues to take flecainide; he's been largely asymptomatic without symptomatic recurrences.     Pulmonary function testing 5/15 demonstrated mild emphysema..  There has been no syncope or presyncope.     Hospitalized 8/18 for aortic dissection w root replacement, prosthetic AVR and reconstruction of entire ascending aorta cx by pericardial effusion requiring pericardiocentesis and some R hand injury.  Subsequently developed worsening ascending aortic dilatation and underwent endovascular repair 8/19+  DATE TEST EF   2013/  MYOVIEW   58 % Abnormal  2013 [facility]    % Normal CAs  12/16 MYOVIEW 48%   9/18 Echo  50-55% Bioprosthetic AVR     Date PR QRS Flecainide  5/12  112   5/15  116 100  5/16  132 100  6/17  132 100  12/17  136 100  9/18 166 144 100  1/19 180 146 100  5/19 176 156 100  9/19`  170 100            Thromboembolic risk factors ( age -70, HTN-1, Vasc disease -1) for a CHADSVASc Score of 3    Past Medical History:  Diagnosis Date  . Allergy   . Arthritis   . Ascending aortic dissection ([place]) 03/23/2017  . Atrial fibrillation ([place])   . Cataract    removed both eyes   . GERD (gastroesophageal reflux disease)   . HYPERLIPIDEMIA   . 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 Hypertension   . Microscopic hematuria    negative cystoscopy  . NONSPECIFIC ABNORMAL ELECTROCARDIOGRAM   . Pleural effusion on right   . Pleural effusion, bilateral   . S/P aortic  dissection repair 03/24/2017   Biological Bentall aortic root replacement + resection and grafting of entire ascending aorta, transverse aortic arch and proximal descending thoracic aorta with elephant trunk distal anastomosis and debranching of aortic arch vessels  . 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 Varicose veins     Past Surgical History:  Procedure Laterality Date  . APPENDECTOMY    . CARDIAC CATHETERIZATION      X3;Dr Caryl Comes, last 01/20/12: mild non-obstructive CAD, normal LV systolic function  . COLONOSCOPY    . CYSTOSCOPY  1978   Dr Hartley Barefoot  . EYE SURGERY    . IR THORACENTESIS ASP PLEURAL SPACE W/IMG GUIDE  11/25/2017  . KNEE ARTHROSCOPY  2012   Dr Theda Sers  . LUMBAR LAMINECTOMY  1987   Dr Durward Fortes  . MYELOGRAM  2007  . PERICARDIOCENTESIS N/A 04/07/2017   Procedure: PERICARDIOCENTESIS;  Surgeon: Sherren Mocha, MD;  Location: [facility];  Service: Cardiovascular;  Laterality: N/A;  . POLYPECTOMY    . REPAIR OF ACUTE ASCENDING THORACIC AORTIC DISSECTION N/A 03/23/2017   Procedure: REPAIR OF ACUTE ASCENDING THORACIC AORTIC DISSECTION.  Bentall procedure.  Aortic root repleacement  with bioprosthetic valve.  Reimplantation of left and right coronary arteries.  Total resection of transverse aortic arch.  Elephant trunk distal anastomosis and debranching of arch vessels.;  Surgeon: Rexene Alberts, MD;  Location: [facility];  Service: Vascular;  Laterality: N/A;  . ROTATOR CUFF REPAIR    . SHOULDER ARTHROSCOPY  2011   Dr Theda Sers  . SHOULDER ARTHROSCOPY Right 08/2015  . SPINAL FUSION  1986   Dr Rolin Barry  . TEE WITHOUT CARDIOVERSION N/A 03/23/2017   Procedure: TRANSESOPHAGEAL ECHOCARDIOGRAM (TEE);  Surgeon: Rexene Alberts, MD;  Location: [facility];  Service: Open Heart Surgery;   Laterality: N/A;  . THORACIC AORTIC ENDOVASCULAR STENT GRAFT N/A 03/30/2018   Procedure: THORACIC AORTIC ENDOVASCULAR STENT GRAFT WITH INTRAVASCULAR ULTRASOUND;  Surgeon: Serafina Mitchell, MD;  Location: [facility];  Service: Vascular;  Laterality: N/A;  . TRACHEOSTOMY     age 22 for croup    Current Outpatient Medications  Medication Sig Dispense Refill  . albuterol (PROAIR HFA) 108 (90 Base) MCG/ACT inhaler Inhale two puffs every four to six hours as needed for cough or wheeze. 3 Inhaler 0  . albuterol (PROVENTIL) (2.5 MG/3ML) 0.083% nebulizer solution Take 3 mLs (2.5 mg total) by nebulization every 8 (eight) hours as needed for wheezing or shortness of breath. 150 mL 1  . ALPRAZolam (XANAX) 1 MG tablet Take 1-1.5 tablets (1-1.5 mg total) by mouth at bedtime. 40 tablet 3  . aspirin EC 81 MG tablet Take 1 tablet (81 mg total) by mouth daily.    Marland Kitchen augmented betamethasone dipropionate (DIPROLENE-AF) 0.05 % cream Apply topically 2 (two) times daily as needed. 100 g 3  . azelastine (ASTELIN) 0.1 % nasal spray Can use two sprays in each nostril twice daily as directed. 90 mL 3  . diphenhydrAMINE (BENADRYL) 25 MG tablet Take 25 mg by mouth at bedtime as needed.    Marland Kitchen escitalopram (LEXAPRO) 10 MG tablet Take 1 tablet (10 mg total) by mouth daily. 30 tablet 5  . esomeprazole (NEXIUM) 40 MG capsule Take 1 capsule (40 mg total) by mouth daily before breakfast. 90 capsule 3  . flecainide (TAMBOCOR) 100 MG tablet Take 1 tablet (100 mg total) by mouth 2 (two) times daily. 180 tablet 3  . fluticasone (FLONASE) 50 MCG/ACT nasal spray Use one spray in each nostril twice daily. 48 g 3  . fluticasone furoate-vilanterol (BREO ELLIPTA) 200-25 MCG/INH AEPB Inhale 1 puff into the lungs daily. 180 each 3  . furosemide (LASIX) 40 MG tablet Take 1 tablet (40 mg total) by mouth daily. 30 tablet 5  . metoprolol tartrate (LOPRESSOR) 25 MG tablet Take 2 tablets (50 mg total) by mouth 2 (two) times daily. 180 tablet 3  .  potassium chloride (K-DUR) 10 MEQ tablet Take 1 tablet (10 mEq total) by mouth daily. 30 tablet 5  . traMADol (ULTRAM) 50 MG tablet Take 1 tablet (50 mg total) by mouth every 6 (six) hours as needed for moderate pain. 10 tablet 0  . umeclidinium bromide (INCRUSE ELLIPTA) 62.5 MCG/INH AEPB Inhale 1 puff into the lungs daily.     No current facility-administered medications for this visit.     Allergies  Allergen Reactions  . Simvastatin Other (See Comments)    Mental status changes  . Celecoxib Other (See Comments)    GI UPSET AND INFLAMMATION  . Codeine Other (See Comments)    GI UPSET AND INFLAMMATION  . Nsaids Other (See Comments)    GI UPSET AND INFLAMMATION (  can tolerate via IV)  . Tape Other (See Comments)    Medical tape and Band-Aids PULL OFF THE SKIN; please use Coban wrap  . Cephalexin Itching and Rash  . Latex Rash    Review of Systems negative except from HPI and PMH  Physical Exam BP 124/82   Pulse 87   Ht 6' (1.829 m)   Wt 195 lb (88.5 kg)   SpO2 91%   BMI 26.45 kg/m  Well developed and nourished in no acute distress HENT normal Neck supple with JVP 7  clear Regular rate and rhythm, mechanical S2 to 3/6 systolic murmur Abd-soft with active BS No Clubbing cyanosis 1+ edema Skin-warm and dry A & Oriented  Grossly normal sensory and motor function    ECG sinus at 49 Interval 18/16/54  Assessment and Plan  Atrial fibrillation  Hypertension  Aortic dissection status post reconstruction   Aneurysmal dilatation at the hiatus and in the mid abdomen  Sinus bradycardia  Cold intolerance   The patient's bradycardia is aggravated by his increased beta-blockers.  These have been uptitrated to protect his aneurysmal dilatation.  Once these have been addressed preprocedurally, we can perhaps decrease the beta-blocker.  Alternatively, if it is felt to be better vascular and thoracic surgery, could consider pacing for chronotropic incompetence.  Blood  pressure remains a little bit elevated.  I will review this with Dr. Faye Ramsay.  Atrial fibrillation remains paroxysmal.  Cold intolerance we will check his TSH last checked 2 years ago.  It may also be related to his bradycardia.  We spent more than 50% of our >25 min visit in face to face counseling regarding the above               Patient Care Team: Colon Branch, MD as PCP - General (Internal Medicine) Deboraha Sprang, MD as Consulting Physician (Cardiology) Darleen Crocker, MD as Consulting Physician (Ophthalmology) Loletha Carrow Kirke Corin, MD as Consulting Physician (Gastroenterology) Neldon Mc, Donnamarie Poag, MD as Consulting Physician (Allergy and Immunology) Rexene Alberts, MD as Consulting Physician (Cardiothoracic Surgery)   HPI  Sean Riley is a 67 y.o. male Seen With Chief Complaint of  atrial fibrillation. He continues to take flecainide; he's been largely asymptomatic without symptomatic recurrences.  There is a concern that he may be allergic to diltiazem as he has a pruritic rash   Pulmonary function testing 5/15 demonstrated mild emphysema..  There has been no syncope or presyncope.    He underwent Myoview scanning 2013 which was abnormal and underwent catheterization which was normal.   9/18  Echo mild LVH EF 50-55%; bioprosthesis w normal function, mean grad 13    Date PR QRS Flecainide  5/12  112   5/15  116 100  5/16  132 100  6/17  132 100  12/17  136 100  9/18 166 144 100  1/19 180 146 100  5/19 176 156 100     Hospitalized 8/18 for aortic dissection w root replacement, prosthetic AVR and reconstruction of entire ascending aorta cx by pericardial effusion requiring pericardiocentesis and some R hand injury  8/19 he underwent endovascular repair of his descending aorta.  He has tolerated this well.  Over recent weeks he has had problems with shortness of breath and edema.  Last 48 hours have see much more sleepiness.  His diuretics were discontinued by  his PCP because of low sodium  Thromboembolic risk factors ( age -70, HTN-1, Vasc disease -1) for a CHADSVASc  Score of 3    Past Medical History:  Diagnosis Date  . Allergy   . Arthritis   . Ascending aortic dissection ([place]) 03/23/2017  . Atrial fibrillation ([place])   . Cataract    removed both eyes   . GERD (gastroesophageal reflux disease)   . HYPERLIPIDEMIA   . 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 Hypertension   . Microscopic hematuria    negative cystoscopy  . NONSPECIFIC ABNORMAL ELECTROCARDIOGRAM   . Pleural effusion on right   . Pleural effusion, bilateral   . S/P aortic dissection repair 03/24/2017   Biological Bentall aortic root replacement + resection and grafting of entire ascending aorta, transverse aortic arch and proximal descending thoracic aorta with elephant trunk distal anastomosis and debranching of aortic arch vessels  . 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 Varicose veins     Past Surgical History:  Procedure Laterality Date  . APPENDECTOMY    . CARDIAC CATHETERIZATION      X3;Dr Caryl Comes, last 01/20/12: mild non-obstructive CAD, normal LV systolic function  . COLONOSCOPY    . CYSTOSCOPY  1978   Dr Hartley Barefoot  . EYE SURGERY    . IR THORACENTESIS ASP PLEURAL SPACE W/IMG GUIDE  11/25/2017  . KNEE ARTHROSCOPY  2012   Dr Theda Sers  . LUMBAR LAMINECTOMY  1987   Dr Durward Fortes  . MYELOGRAM  2007  . PERICARDIOCENTESIS N/A 04/07/2017   Procedure: PERICARDIOCENTESIS;  Surgeon: Sherren Mocha, MD;  Location: [facility];  Service: Cardiovascular;  Laterality: N/A;  . POLYPECTOMY    . REPAIR OF ACUTE ASCENDING THORACIC AORTIC DISSECTION N/A 03/23/2017   Procedure: REPAIR OF ACUTE ASCENDING THORACIC AORTIC DISSECTION.  Bentall procedure.  Aortic root  repleacement with bioprosthetic valve.  Reimplantation of left and right coronary arteries.  Total resection of transverse aortic arch.  Elephant trunk distal anastomosis and debranching of arch vessels.;  Surgeon: Rexene Alberts, MD;  Location: [facility];  Service: Vascular;  Laterality: N/A;  . ROTATOR CUFF REPAIR    . SHOULDER ARTHROSCOPY  2011   Dr Theda Sers  . SHOULDER ARTHROSCOPY Right 08/2015  . SPINAL FUSION  1986   Dr Rolin Barry  . TEE WITHOUT CARDIOVERSION N/A 03/23/2017   Procedure: TRANSESOPHAGEAL ECHOCARDIOGRAM (TEE);  Surgeon: Rexene Alberts, MD;  Location: [facility];  Service: Open Heart Surgery;  Laterality: N/A;  . THORACIC AORTIC ENDOVASCULAR STENT GRAFT N/A 03/30/2018   Procedure: THORACIC AORTIC ENDOVASCULAR STENT GRAFT WITH INTRAVASCULAR ULTRASOUND;  Surgeon: Serafina Mitchell, MD;  [facility];  Service: Vascular;  Laterality: N/A;  . TRACHEOSTOMY     age 57 for croup    Current Outpatient Medications  Medication Sig Dispense Refill  . albuterol (PROAIR HFA) 108 (90 Base) MCG/ACT inhaler Inhale two puffs every four to six hours as needed for cough or wheeze. 3 Inhaler 0  . albuterol (PROVENTIL) (2.5 MG/3ML) 0.083% nebulizer solution Take 3 mLs (2.5 mg total) by nebulization every 8 (eight) hours as needed for wheezing or shortness of breath. 150 mL 1  . ALPRAZolam (XANAX) 1 MG tablet Take 1-1.5 tablets (1-1.5 mg total) by mouth at bedtime. 40 tablet 3  . aspirin EC 81 MG tablet Take 1 tablet (81 mg total) by mouth daily.    Marland Kitchen augmented  betamethasone dipropionate (DIPROLENE-AF) 0.05 % cream Apply topically 2 (two) times daily as needed. 100 g 3  . azelastine (ASTELIN) 0.1 % nasal spray Can use two sprays in each nostril twice daily as directed. 90 mL 3  . diphenhydrAMINE (BENADRYL) 25 MG tablet Take 25 mg by mouth at bedtime as needed.    Marland Kitchen escitalopram (LEXAPRO) 10 MG tablet Take 1 tablet (10 mg total) by mouth daily. 30 tablet 5  . esomeprazole (NEXIUM) 40 MG capsule Take 1  capsule (40 mg total) by mouth daily before breakfast. 90 capsule 3  . flecainide (TAMBOCOR) 100 MG tablet Take 1 tablet (100 mg total) by mouth 2 (two) times daily. 180 tablet 3  . fluticasone (FLONASE) 50 MCG/ACT nasal spray Use one spray in each nostril twice daily. 48 g 3  . fluticasone furoate-vilanterol (BREO ELLIPTA) 200-25 MCG/INH AEPB Inhale 1 puff into the lungs daily. 180 each 3  . furosemide (LASIX) 40 MG tablet Take 1 tablet (40 mg total) by mouth daily. 30 tablet 5  . metoprolol tartrate (LOPRESSOR) 25 MG tablet Take 2 tablets (50 mg total) by mouth 2 (two) times daily. 180 tablet 3  . potassium chloride (K-DUR) 10 MEQ tablet Take 1 tablet (10 mEq total) by mouth daily. 30 tablet 5  . traMADol (ULTRAM) 50 MG tablet Take 1 tablet (50 mg total) by mouth every 6 (six) hours as needed for moderate pain. 10 tablet 0  . umeclidinium bromide (INCRUSE ELLIPTA) 62.5 MCG/INH AEPB Inhale 1 puff into the lungs daily.     No current facility-administered medications for this visit.     Allergies  Allergen Reactions  . Simvastatin Other (See Comments)    Mental status changes  . Celecoxib Other (See Comments)    GI UPSET AND INFLAMMATION  . Codeine Other (See Comments)    GI UPSET AND INFLAMMATION  . Nsaids Other (See Comments)    GI UPSET AND INFLAMMATION (can tolerate via IV)  . Tape Other (See Comments)    Medical tape and Band-Aids PULL OFF THE SKIN; please use Coban wrap  . Cephalexin Itching and Rash  . Latex Rash    Review of Systems negative except from HPI and PMH  Physical Exam BP 124/82   Pulse 87   Ht 6' (1.829 m)   Wt 195 lb (88.5 kg)   SpO2 91%   BMI 26.45 kg/m  Well developed and nourished in no acute distress HENT normal Neck supple with JVP-flat Clear Regular rate and rhythm, no murmurs or gallops Abd-soft with active BS No Clubbing cyanosis 2+edema Skin-warm and dry A & Oriented  Grossly normal sensory and motor function   ECG atrial tachycardia 360  msec  RBBB LAD    Assessment and Plan  Atrial fibrillation/Tachycardia   Hypertension  Aortic dissection status post reconstruction   Aneurysmal dilatation at the hiatus and in the mid abdomen  Sinus bradycardia  QRS widening on flecainide  Right bundle branch block     The patient is an atrial tachycardia with 2: 1 block.  Review of tracings from the hospital 8/19 demonstrated sinus rhythm.  He has noticed over the last 48 hours and his heart rate has gone from the 50s--90s consistent with its onset.  We anticipate that will revert on its own.  I have asked him to call us in 3 days if it has not.  We would need to start him on anticoagulation and undertake cardioversion.  He is also volume overloaded.  We will have him resume his diuretic w concomitant potassium  He may also reach the limit of his flecainide his QRS is widened considerably; I am not sure to what degree this is rate related.  We will review things in a couple of months and make a decision regarding flecainide dosing.

## 2018-05-02 NOTE — Patient Instructions (Signed)
Medication Instructions:  Your physician has recommended you make the following change in your medication:   1. Please begin your As Needed diuretic, 40mg  lasix, once daily. Please take your potassium supplement with your lasix.  Labwork: None ordered.  Testing/Procedures: None ordered.  Follow-Up: Your physician recommends that you schedule a follow-up appointment in:   3 months with Dr Caryl Comes.  Please call us later this week if your heart rate continues to be elevated.  Any Other Special Instructions Will Be Listed Below (If Applicable).     If you need a refill on your cardiac medications before your next appointment, please call your pharmacy.

## 2018-05-05 ENCOUNTER — Telehealth: Payer: Self-pay | Admitting: Internal Medicine

## 2018-05-05 DIAGNOSIS — J439 Emphysema, unspecified: Secondary | ICD-10-CM | POA: Diagnosis not present

## 2018-05-05 DIAGNOSIS — I716 Thoracoabdominal aortic aneurysm, without rupture: Secondary | ICD-10-CM | POA: Diagnosis not present

## 2018-05-05 DIAGNOSIS — Z48812 Encounter for surgical aftercare following surgery on the circulatory system: Secondary | ICD-10-CM | POA: Diagnosis not present

## 2018-05-05 DIAGNOSIS — I4891 Unspecified atrial fibrillation: Secondary | ICD-10-CM | POA: Diagnosis not present

## 2018-05-05 DIAGNOSIS — J309 Allergic rhinitis, unspecified: Secondary | ICD-10-CM | POA: Diagnosis not present

## 2018-05-05 DIAGNOSIS — I10 Essential (primary) hypertension: Secondary | ICD-10-CM | POA: Diagnosis not present

## 2018-05-05 NOTE — Telephone Encounter (Signed)
Pt's wife calling today. She states pt is still in atrial tachycardia and was asked to call and report this to Dr Caryl Comes. Pt's rate seems controlled, mostly under 95bpm. Per Dr Olin Pia OV note on 9/23, pt will need to be seen for evaluation for anticoagulation and discussion of DCCV. Appt has been set up for pt to be seen in our Afib clinic on 9/27 for review.

## 2018-05-05 NOTE — Telephone Encounter (Signed)
°  Patient's spouse calling to report HR  STAT if HR is under 50 or over 120 (normal HR is 60-100 beats per minute)  1) What is your heart rate? 94  2) Do you have a log of your heart rate readings (document readings)? 87-->97  3) Do you have any other symptoms? NO

## 2018-05-06 ENCOUNTER — Encounter (HOSPITAL_COMMUNITY): Payer: Self-pay | Admitting: Nurse Practitioner

## 2018-05-06 ENCOUNTER — Ambulatory Visit (HOSPITAL_COMMUNITY)
Admission: RE | Admit: 2018-05-06 | Discharge: 2018-05-06 | Disposition: A | Discharge status: Home / Self Care | Payer: Medicare Other | Source: Ambulatory Visit | Attending: Nurse Practitioner | Admitting: Nurse Practitioner

## 2018-05-06 VITALS — BP 122/76 | HR 94 | Ht 72.0 in | Wt 195.8 lb

## 2018-05-06 DIAGNOSIS — Z7901 Long term (current) use of anticoagulants: Secondary | ICD-10-CM | POA: Diagnosis not present

## 2018-05-06 DIAGNOSIS — I10 Essential (primary) hypertension: Secondary | ICD-10-CM | POA: Insufficient documentation

## 2018-05-06 DIAGNOSIS — K219 Gastro-esophageal reflux disease without esophagitis: Secondary | ICD-10-CM | POA: Diagnosis not present

## 2018-05-06 DIAGNOSIS — I471 Supraventricular tachycardia: Secondary | ICD-10-CM

## 2018-05-06 DIAGNOSIS — I4510 Unspecified right bundle-branch block: Secondary | ICD-10-CM | POA: Insufficient documentation

## 2018-05-06 DIAGNOSIS — J90 Pleural effusion, not elsewhere classified: Secondary | ICD-10-CM | POA: Diagnosis not present

## 2018-05-06 DIAGNOSIS — Z8249 Family history of ischemic heart disease and other diseases of the circulatory system: Secondary | ICD-10-CM | POA: Diagnosis not present

## 2018-05-06 DIAGNOSIS — I452 Bifascicular block: Secondary | ICD-10-CM | POA: Diagnosis not present

## 2018-05-06 DIAGNOSIS — Z87891 Personal history of nicotine dependence: Secondary | ICD-10-CM | POA: Diagnosis not present

## 2018-05-06 DIAGNOSIS — E785 Hyperlipidemia, unspecified: Secondary | ICD-10-CM | POA: Diagnosis not present

## 2018-05-06 DIAGNOSIS — Z79899 Other long term (current) drug therapy: Secondary | ICD-10-CM | POA: Insufficient documentation

## 2018-05-06 DIAGNOSIS — I444 Left anterior fascicular block: Secondary | ICD-10-CM | POA: Insufficient documentation

## 2018-05-06 DIAGNOSIS — M1990 Unspecified osteoarthritis, unspecified site: Secondary | ICD-10-CM | POA: Diagnosis not present

## 2018-05-06 DIAGNOSIS — Z9889 Other specified postprocedural states: Secondary | ICD-10-CM | POA: Diagnosis not present

## 2018-05-06 DIAGNOSIS — I480 Paroxysmal atrial fibrillation: Secondary | ICD-10-CM | POA: Insufficient documentation

## 2018-05-06 DIAGNOSIS — I2510 Atherosclerotic heart disease of native coronary artery without angina pectoris: Secondary | ICD-10-CM | POA: Diagnosis not present

## 2018-05-06 DIAGNOSIS — Z7982 Long term (current) use of aspirin: Secondary | ICD-10-CM | POA: Diagnosis not present

## 2018-05-06 MED ORDER — APIXABAN 5 MG PO TABS: 5.0000 mg | ORAL_TABLET | Freq: Two times a day (BID) | ORAL | 0 refills | Status: DC

## 2018-05-06 NOTE — Patient Instructions (Signed)
Do not start until we call you. Eliquis 5mg  twice a day

## 2018-05-06 NOTE — Progress Notes (Signed)
Primary Care Physician: Colon Branch, MD Referring Physician: Dr. Laury Deep is a 67 y.o. male with a h/o paroxysmal afib on long standing  flecainide. He was recently seen by Dr. Caryl Comes and was found to have elevated HR. EKG showed atrial  tach with 2:1 block. He was asked to take his HR at home and if remained elevated to f/u in afib clinic for anticoagulation and set up DCCV.   Pt has h/o of ascending  aortic dissection repair 8/18 and thoracic aorta endovascular stent 03/2018.  Today, he denies symptoms of palpitations, chest pain, shortness of breath, orthopnea, PND, lower extremity edema, dizziness, presyncope, syncope, or neurologic sequela.+ fatigue  being out of rhythm   The patient is tolerating medications without difficulties and is otherwise without complaint today.   Past Medical History:  Diagnosis Date  . Allergy   . Arthritis   . Ascending aortic dissection ([place]) 03/23/2017  . Atrial fibrillation ([place])   . Cataract    removed both eyes   . GERD (gastroesophageal reflux disease)   . HYPERLIPIDEMIA   . 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 Hypertension   . Microscopic hematuria    negative cystoscopy  . NONSPECIFIC ABNORMAL ELECTROCARDIOGRAM   . Pleural effusion on right   . Pleural effusion, bilateral   . S/P aortic dissection repair 03/24/2017   Biological Bentall aortic root replacement + resection and grafting of entire ascending aorta, transverse aortic arch and proximal descending thoracic aorta with elephant trunk distal anastomosis and debranching of aortic arch vessels  . 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 Varicose veins    Past Surgical History:  Procedure Laterality Date  . APPENDECTOMY    . CARDIAC CATHETERIZATION      X3;Dr  Caryl Comes, last 01/20/12: mild non-obstructive CAD, normal LV systolic function  . COLONOSCOPY    . CYSTOSCOPY  1978   Dr Hartley Barefoot  . EYE SURGERY    . IR THORACENTESIS ASP PLEURAL SPACE W/IMG GUIDE  11/25/2017  . KNEE ARTHROSCOPY  2012   Dr Theda Sers  . LUMBAR LAMINECTOMY  1987   Dr Durward Fortes  . MYELOGRAM  2007  . PERICARDIOCENTESIS N/A 04/07/2017   Procedure: PERICARDIOCENTESIS;  Surgeon: Sherren Mocha, MD;  Location: [facility];  Service: Cardiovascular;  Laterality: N/A;  . POLYPECTOMY    . REPAIR OF ACUTE ASCENDING THORACIC AORTIC DISSECTION N/A 03/23/2017   Procedure: REPAIR OF ACUTE ASCENDING THORACIC AORTIC DISSECTION.  Bentall procedure.  Aortic root repleacement with bioprosthetic valve.  Reimplantation of left and right coronary arteries.  Total resection of transverse aortic arch.  Elephant trunk distal anastomosis and debranching of arch vessels.;  Surgeon: Rexene Alberts, MD;  Location: [facility];  Service: Vascular;  Laterality: N/A;  . ROTATOR CUFF REPAIR    . SHOULDER ARTHROSCOPY  2011   Dr Theda Sers  . SHOULDER ARTHROSCOPY Right 08/2015  . SPINAL FUSION  1986   Dr Rolin Barry  . TEE WITHOUT CARDIOVERSION N/A 03/23/2017   Procedure: TRANSESOPHAGEAL ECHOCARDIOGRAM (TEE);  Surgeon: Rexene Alberts, MD;  Location: [facility];  Service: Open Heart Surgery;  Laterality: N/A;  . THORACIC AORTIC ENDOVASCULAR STENT GRAFT N/A 03/30/2018   Procedure: THORACIC AORTIC ENDOVASCULAR STENT GRAFT WITH INTRAVASCULAR ULTRASOUND;  Surgeon: Trula Slade,  Butch Penny, MD;  Location: [facility];  Service: Vascular;  Laterality: N/A;  . TRACHEOSTOMY     age 16 for croup    Current Outpatient Medications  Medication Sig Dispense Refill  . albuterol (PROAIR HFA) 108 (90 Base) MCG/ACT inhaler Inhale two puffs every four to six hours as needed for cough or wheeze. 3 Inhaler 0  . albuterol (PROVENTIL) (2.5 MG/3ML) 0.083% nebulizer solution Take 3 mLs (2.5 mg total) by nebulization every 8 (eight) hours as needed for  wheezing or shortness of breath. 150 mL 1  . ALPRAZolam (XANAX) 1 MG tablet Take 1-1.5 tablets (1-1.5 mg total) by mouth at bedtime. 40 tablet 3  . aspirin EC 81 MG tablet Take 1 tablet (81 mg total) by mouth daily.    . diphenhydrAMINE (BENADRYL) 25 MG tablet Take 25 mg by mouth at bedtime as needed.    Marland Kitchen escitalopram (LEXAPRO) 10 MG tablet Take 1 tablet (10 mg total) by mouth daily. 30 tablet 5  . esomeprazole (NEXIUM) 40 MG capsule Take 1 capsule (40 mg total) by mouth daily before breakfast. 90 capsule 3  . flecainide (TAMBOCOR) 100 MG tablet Take 1 tablet (100 mg total) by mouth 2 (two) times daily. 180 tablet 3  . fluticasone (FLONASE) 50 MCG/ACT nasal spray Use one spray in each nostril twice daily. 48 g 3  . fluticasone furoate-vilanterol (BREO ELLIPTA) 200-25 MCG/INH AEPB Inhale 1 puff into the lungs daily. 180 each 3  . furosemide (LASIX) 40 MG tablet Take 1 tablet (40 mg total) by mouth daily. 30 tablet 5  . metoprolol tartrate (LOPRESSOR) 25 MG tablet Take 2 tablets (50 mg total) by mouth 2 (two) times daily. 180 tablet 3  . potassium chloride (K-DUR) 10 MEQ tablet Take 1 tablet (10 mEq total) by mouth daily. 30 tablet 5  . umeclidinium bromide (INCRUSE ELLIPTA) 62.5 MCG/INH AEPB Inhale 1 puff into the lungs daily.    Marland Kitchen apixaban (ELIQUIS) 5 MG TABS tablet Take 1 tablet (5 mg total) by mouth 2 (two) times daily. 60 tablet 0  . augmented betamethasone dipropionate (DIPROLENE-AF) 0.05 % cream Apply topically 2 (two) times daily as needed. 100 g 3  . azelastine (ASTELIN) 0.1 % nasal spray Can use two sprays in each nostril twice daily as directed. 90 mL 3   No current facility-administered medications for this encounter.     Allergies  Allergen Reactions  . Simvastatin Other (See Comments)    Mental status changes  . Celecoxib Other (See Comments)    GI UPSET AND INFLAMMATION  . Codeine Other (See Comments)    GI UPSET AND INFLAMMATION  . Nsaids Other (See Comments)    GI UPSET  AND INFLAMMATION (can tolerate via IV)  . Tape Other (See Comments)    Medical tape and Band-Aids PULL OFF THE SKIN; please use Coban wrap  . Cephalexin Itching and Rash  . Latex Rash    Social History   Socioeconomic History  . Marital status: Married    Spouse name: Not on file  . Number of children: 2  . Years of education: Not on file  . Highest education level: Not on file  Occupational History  . 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 Financial resource strain: Not on file  . Food insecurity:    Worry: Not on file    Inability: Not on file  . Transportation needs:    Medical: Not on file    Non-medical: Not  on file  Tobacco Use  . Smoking status: Former Smoker    Packs/day: 2.00    Years: 45.00    Pack years: 90.00    Types: Cigarettes    Last attempt to quit: 03/22/2017    Years since quitting: 1.1  . Smokeless tobacco: Never Used  . Tobacco comment: 2 ppd , quit 03/2017  Substance and Sexual Activity  . Alcohol use: Yes    Alcohol/week: 0.0 standard drinks    Comment: 5 cans of beer daily  . Drug use: No  . Sexual activity: Never  Lifestyle  . Physical activity:    Days per week: Not on file    Minutes per session: Not on file  . Stress: Not on file  Relationships  . Social connections:    Talks on phone: Not on file    Gets together: Not on file    Attends religious service: Not on file    Active member of club or organization: Not on file    Attends meetings of clubs or organizations: Not on file    Relationship status: Not on file  . Intimate partner violence:    Fear of current or ex partner: Not on file    Emotionally abused: Not on file    Physically abused: Not on file    Forced sexual activity: Not on file  Other Topics Concern  . Not on file  Social History Narrative   Lives w/ wife    Family History  Problem Relation Age of Onset  . Hypertension Mother   . Hypertension Father   . Benign prostatic hyperplasia Father          S/P TURP  . Heart attack Maternal Grandmother        MI in 71s  . Breast cancer Maternal Grandmother   . Arrhythmia Brother         X 2  . Heart attack Maternal Aunt        MI in 33s  . Stroke Neg Hx   . Diabetes Neg Hx   . Colon cancer Neg Hx   . Colon polyps Neg Hx   . Stomach cancer Neg Hx   . Rectal cancer Neg Hx   . Esophageal cancer Neg Hx   . Prostate cancer Neg Hx     ROS- All systems are reviewed and negative except as per the HPI above  Physical Exam: Vitals:   05/06/18 1136  BP: 122/76  Pulse: 94  Weight: 88.8 kg  Height: 6' (1.829 m)   Wt Readings from Last 3 Encounters:  05/06/18 88.8 kg  05/02/18 88.5 kg  04/25/18 86.2 kg    Labs: Lab Results  Component Value Date   NA 129 (L) 04/27/2018   K 4.4 04/27/2018   CL 94 (L) 04/27/2018   CO2 30 04/27/2018   GLUCOSE 87 04/27/2018   BUN 12 04/27/2018   CREATININE 0.92 04/27/2018   CALCIUM 8.9 04/27/2018   MG 2.0 04/18/2018   Lab Results  Component Value Date   INR 1.29 03/30/2018   Lab Results  Component Value Date   CHOL 145 06/14/2017   HDL 34.40 (L) 06/14/2017   LDLCALC 93 06/14/2017   TRIG 88.0 06/14/2017     GEN- The patient is well appearing, alert and oriented x 3 today.   Head- normocephalic, atraumatic Eyes-  Sclera clear, conjunctiva pink Ears- hearing intact Oropharynx- clear Neck- supple, no JVP Lymph- no cervical lymphadenopathy Lungs- Clear to ausculation bilaterally,  normal work of breathing Heart- Regular rate and rhythm, no murmurs, rubs or gallops, PMI not laterally displaced GI- soft, NT, ND, + BS Extremities- no clubbing, cyanosis, or edema MS- no significant deformity or atrophy Skin- no rash or lesion Psych- euthymic mood, full affect Neuro- strength and sensation are intact  EKG- atrial tach at 94 bpm    Assessment and Plan: 1. Atrial tach  Continue flecainide 100 mg bid  and metoprolol 25 mg bid Dr. Caryl Comes did say in his last note with BBB, he would  look at dose of flecainide in the near future for possible dose adjustment  2. CHA2DS2VASc score of at least 2 Denies bleeding history Will start eliquis 5 mg bid  I did check with vascular, they do not have any issues with anticoagulation being started but would like ASA continued for recent stent placement  Bleeding precautions discussed   F/u in afib clinic in 10-14 days  Butch Penny C. Peta Peachey, [facility] [address] [place], [place] [address] [phone]

## 2018-05-18 DIAGNOSIS — Z23 Encounter for immunization: Secondary | ICD-10-CM | POA: Diagnosis not present

## 2018-05-23 ENCOUNTER — Ambulatory Visit: Payer: Medicare Other

## 2018-05-23 ENCOUNTER — Encounter (HOSPITAL_COMMUNITY): Payer: Self-pay | Admitting: Nurse Practitioner

## 2018-05-23 ENCOUNTER — Ambulatory Visit: Payer: Medicare Other | Admitting: Thoracic Surgery (Cardiothoracic Vascular Surgery)

## 2018-05-23 ENCOUNTER — Ambulatory Visit (HOSPITAL_COMMUNITY)
Admission: RE | Admit: 2018-05-23 | Discharge: 2018-05-23 | Disposition: A | Discharge status: Home / Self Care | Payer: Medicare Other | Source: Ambulatory Visit | Attending: Nurse Practitioner | Admitting: Nurse Practitioner

## 2018-05-23 ENCOUNTER — Ambulatory Visit: Admission: RE | Admit: 2018-05-23 | Payer: Medicare Other | Source: Ambulatory Visit

## 2018-05-23 VITALS — BP 122/74 | HR 83 | Ht 72.0 in | Wt 194.0 lb

## 2018-05-23 DIAGNOSIS — J90 Pleural effusion, not elsewhere classified: Secondary | ICD-10-CM | POA: Insufficient documentation

## 2018-05-23 DIAGNOSIS — Z952 Presence of prosthetic heart valve: Secondary | ICD-10-CM | POA: Insufficient documentation

## 2018-05-23 DIAGNOSIS — Z955 Presence of coronary angioplasty implant and graft: Secondary | ICD-10-CM | POA: Diagnosis not present

## 2018-05-23 DIAGNOSIS — Z79899 Other long term (current) drug therapy: Secondary | ICD-10-CM | POA: Insufficient documentation

## 2018-05-23 DIAGNOSIS — Z7982 Long term (current) use of aspirin: Secondary | ICD-10-CM | POA: Diagnosis not present

## 2018-05-23 DIAGNOSIS — E785 Hyperlipidemia, unspecified: Secondary | ICD-10-CM | POA: Insufficient documentation

## 2018-05-23 DIAGNOSIS — Z8249 Family history of ischemic heart disease and other diseases of the circulatory system: Secondary | ICD-10-CM | POA: Diagnosis not present

## 2018-05-23 DIAGNOSIS — I4510 Unspecified right bundle-branch block: Secondary | ICD-10-CM | POA: Insufficient documentation

## 2018-05-23 DIAGNOSIS — I471 Supraventricular tachycardia: Secondary | ICD-10-CM

## 2018-05-23 DIAGNOSIS — Z87891 Personal history of nicotine dependence: Secondary | ICD-10-CM | POA: Diagnosis not present

## 2018-05-23 DIAGNOSIS — Z7901 Long term (current) use of anticoagulants: Secondary | ICD-10-CM | POA: Diagnosis not present

## 2018-05-23 DIAGNOSIS — I480 Paroxysmal atrial fibrillation: Secondary | ICD-10-CM | POA: Insufficient documentation

## 2018-05-23 DIAGNOSIS — I10 Essential (primary) hypertension: Secondary | ICD-10-CM | POA: Diagnosis not present

## 2018-05-23 DIAGNOSIS — Z7951 Long term (current) use of inhaled steroids: Secondary | ICD-10-CM | POA: Insufficient documentation

## 2018-05-23 DIAGNOSIS — K219 Gastro-esophageal reflux disease without esophagitis: Secondary | ICD-10-CM | POA: Insufficient documentation

## 2018-05-23 DIAGNOSIS — I2510 Atherosclerotic heart disease of native coronary artery without angina pectoris: Secondary | ICD-10-CM | POA: Diagnosis not present

## 2018-05-23 DIAGNOSIS — M1990 Unspecified osteoarthritis, unspecified site: Secondary | ICD-10-CM | POA: Insufficient documentation

## 2018-05-23 LAB — CBC
HCT: 36.3 % — ABNORMAL LOW (ref 39.0–52.0)
Hemoglobin: 10.8 g/dL — ABNORMAL LOW (ref 13.0–17.0)
MCH: 24.6 pg — ABNORMAL LOW (ref 26.0–34.0)
MCHC: 29.8 g/dL — ABNORMAL LOW (ref 30.0–36.0)
MCV: 82.7 fL (ref 80.0–100.0)
NRBC: 0 % (ref 0.0–0.2)
PLATELETS: 276 10*3/uL (ref 150–400)
RBC: 4.39 MIL/uL (ref 4.22–5.81)
RDW: 15.3 % (ref 11.5–15.5)
WBC: 5.8 10*3/uL (ref 4.0–10.5)

## 2018-05-23 LAB — BASIC METABOLIC PANEL
ANION GAP: 5 (ref 5–15)
BUN: 12 mg/dL (ref 8–23)
CALCIUM: 8.9 mg/dL (ref 8.9–10.3)
CO2: 29 mmol/L (ref 22–32)
Chloride: 98 mmol/L (ref 98–111)
Creatinine, Ser: 0.97 mg/dL (ref 0.61–1.24)
GLUCOSE: 85 mg/dL (ref 70–99)
Potassium: 4.1 mmol/L (ref 3.5–5.1)
SODIUM: 132 mmol/L — AB (ref 135–145)

## 2018-05-23 MED ORDER — APIXABAN 5 MG PO TABS: 5.0000 mg | ORAL_TABLET | Freq: Two times a day (BID) | ORAL | 3 refills | Status: DC

## 2018-05-23 NOTE — Patient Instructions (Signed)
Scheduling will call you to setup follow up appointment with Dr. Caryl Comes about 2 weeks after cardioversion  Cardioversion scheduled for Tuesday, October 22nd  - Arrive at the [facility] and go to admitting at 12PM  -Do not eat or drink anything after midnight the night prior to your procedure.  - Take all your morning medication with a sip of water prior to arrival.  - You will not be able to drive home after your procedure.

## 2018-05-23 NOTE — H&P (View-Only) (Signed)
Primary Care Physician: Colon Branch, MD Referring Physician: Dr. Carlyon Shadow is a 67 y.o. male with a h/o paroxysmal afib on long standing  flecainide. He was recently seen by Dr. Caryl Comes and was found to have elevated HR. EKG showed atrial  tach with 2:1 block. He was asked to take his HR at home and if remained elevated to f/u in afib clinic for anticoagulation and set up DCCV.   Pt has h/o of ascending  aortic dissection repair 8/18 and thoracic aorta endovascular stent 03/2018.  He is back in the afib clinic, 10/14, and he continues so to be out of rhythm with an atrial tach vrs atrial flutter.  He feels fatigued.   Today, he denies symptoms of palpitations, chest pain, shortness of breath, orthopnea, PND, lower extremity edema, dizziness, presyncope, syncope, or neurologic sequela.+ fatigue  being out of rhythm   The patient is tolerating medications without difficulties and is otherwise without complaint today.   Past Medical History:  Diagnosis Date  . Allergy   . Arthritis   . Ascending aortic dissection ([place]) 03/23/2017  . Atrial fibrillation ([place])   . Cataract    removed both eyes   . GERD (gastroesophageal reflux disease)   . HYPERLIPIDEMIA   . 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 Hypertension   . Microscopic hematuria    negative cystoscopy  . NONSPECIFIC ABNORMAL ELECTROCARDIOGRAM   . Pleural effusion on right   . Pleural effusion, bilateral   . S/P aortic dissection repair 03/24/2017   Biological Bentall aortic root replacement + resection and grafting of entire ascending aorta, transverse aortic arch and proximal descending thoracic aorta with elephant trunk distal anastomosis and debranching of aortic arch vessels  . 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 Varicose veins    Past Surgical History:  Procedure Laterality Date  . APPENDECTOMY    . CARDIAC CATHETERIZATION      X3;Dr Caryl Comes, last 01/20/12: mild non-obstructive CAD, normal LV systolic function  . COLONOSCOPY    . CYSTOSCOPY  1978   Dr Hartley Barefoot  . EYE SURGERY    . IR THORACENTESIS ASP PLEURAL SPACE W/IMG GUIDE  11/25/2017  . KNEE ARTHROSCOPY  2012   Dr Theda Sers  . LUMBAR LAMINECTOMY  1987   Dr Durward Fortes  . MYELOGRAM  2007  . PERICARDIOCENTESIS N/A 04/07/2017   Procedure: PERICARDIOCENTESIS;  Surgeon: Sherren Mocha, MD;  Location: [place] [facility];  Service: Cardiovascular;  Laterality: N/A;  . POLYPECTOMY    . REPAIR OF ACUTE ASCENDING THORACIC AORTIC DISSECTION N/A 03/23/2017   Procedure: REPAIR OF ACUTE ASCENDING THORACIC AORTIC DISSECTION.  Bentall procedure.  Aortic root repleacement with bioprosthetic valve.  Reimplantation of left and right coronary arteries.  Total resection of transverse aortic arch.  Elephant trunk distal anastomosis and debranching of arch vessels.;  Surgeon: Rexene Alberts, MD;  Location: [place];  Service: Vascular;  Laterality: N/A;  . ROTATOR CUFF REPAIR    . SHOULDER ARTHROSCOPY  2011   Dr Theda Sers  . SHOULDER ARTHROSCOPY Right 08/2015  . SPINAL FUSION  1986   Dr Rolin Barry  . TEE WITHOUT CARDIOVERSION N/A 03/23/2017   Procedure: TRANSESOPHAGEAL ECHOCARDIOGRAM (TEE);  Surgeon: Rexene Alberts, MD;  Location: [place];  Service: Open Heart Surgery;  Laterality: N/A;  . THORACIC AORTIC ENDOVASCULAR STENT GRAFT N/A 03/30/2018   Procedure: THORACIC AORTIC ENDOVASCULAR STENT GRAFT WITH INTRAVASCULAR ULTRASOUND;  Surgeon: Serafina Mitchell, MD;  Location: [facility];  Service: Vascular;  Laterality: N/A;  . TRACHEOSTOMY     age 4 for croup    Current Outpatient Medications  Medication Sig Dispense Refill  . albuterol (PROAIR HFA) 108 (90 Base) MCG/ACT inhaler Inhale two puffs every four to six hours as needed for cough or wheeze. 3 Inhaler 0  .  albuterol (PROVENTIL) (2.5 MG/3ML) 0.083% nebulizer solution Take 3 mLs (2.5 mg total) by nebulization every 8 (eight) hours as needed for wheezing or shortness of breath. 150 mL 1  . ALPRAZolam (XANAX) 1 MG tablet Take 1-1.5 tablets (1-1.5 mg total) by mouth at bedtime. 40 tablet 3  . apixaban (ELIQUIS) 5 MG TABS tablet Take 1 tablet (5 mg total) by mouth 2 (two) times daily. 60 tablet 3  . aspirin EC 81 MG tablet Take 1 tablet (81 mg total) by mouth daily.    Marland Kitchen augmented betamethasone dipropionate (DIPROLENE-AF) 0.05 % cream Apply topically 2 (two) times daily as needed. 100 g 3  . azelastine (ASTELIN) 0.1 % nasal spray Can use two sprays in each nostril twice daily as directed. 90 mL 3  . diphenhydrAMINE (BENADRYL) 25 MG tablet Take 25 mg by mouth at bedtime as needed.    Marland Kitchen escitalopram (LEXAPRO) 10 MG tablet Take 1 tablet (10 mg total) by mouth daily. 30 tablet 5  . esomeprazole (NEXIUM) 40 MG capsule Take 1 capsule (40 mg total) by mouth daily before breakfast. 90 capsule 3  . flecainide (TAMBOCOR) 100 MG tablet Take 1 tablet (100 mg total) by mouth 2 (two) times daily. 180 tablet 3  . fluticasone (FLONASE) 50 MCG/ACT nasal spray Use one spray in each nostril twice daily. 48 g 3  . fluticasone furoate-vilanterol (BREO ELLIPTA) 200-25 MCG/INH AEPB Inhale 1 puff into the lungs daily. 180 each 3  . furosemide (LASIX) 40 MG tablet Take 1 tablet (40 mg total) by mouth daily. 30 tablet 5  . metoprolol tartrate (LOPRESSOR) 25 MG tablet Take 2 tablets (50 mg total) by mouth 2 (two) times daily. 180 tablet 3  . potassium chloride (K-DUR) 10 MEQ tablet Take 1 tablet (10 mEq total) by mouth daily. 30 tablet 5  . umeclidinium bromide (INCRUSE ELLIPTA) 62.5 MCG/INH AEPB Inhale 1 puff into the lungs daily.     No current facility-administered medications for this encounter.     Allergies  Allergen Reactions  . Simvastatin Other (See Comments)    Mental status changes  . Celecoxib Other (See Comments)      GI UPSET AND INFLAMMATION  . Codeine Other (See Comments)    GI UPSET AND INFLAMMATION  . Nsaids Other (See Comments)    GI UPSET AND INFLAMMATION (can tolerate via IV)  . Tape Other (See Comments)    Medical tape and Band-Aids PULL OFF THE SKIN; please use Coban wrap  . Cephalexin Itching and Rash  . Latex Rash    Social History   Socioeconomic History  . Marital status: Married    Spouse name: Not on file  . Number of children: 2  . Years of education: Not on file  . Highest education level: Not on file  Occupational History  . 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 Financial resource strain: Not on file  . Food  insecurity:    Worry: Not on file    Inability: Not on file  . Transportation needs:    Medical: Not on file    Non-medical: Not on file  Tobacco Use  . Smoking status: Former Smoker    Packs/day: 2.00    Years: 45.00    Pack years: 90.00    Types: Cigarettes    Last attempt to quit: 03/22/2017    Years since quitting: 1.1  . Smokeless tobacco: Never Used  . Tobacco comment: 2 ppd , quit 03/2017  Substance and Sexual Activity  . Alcohol use: Yes    Alcohol/week: 0.0 standard drinks    Comment: 5 cans of beer daily  . Drug use: No  . Sexual activity: Never  Lifestyle  . Physical activity:    Days per week: Not on file    Minutes per session: Not on file  . Stress: Not on file  Relationships  . Social connections:    Talks on phone: Not on file    Gets together: Not on file    Attends religious service: Not on file    Active member of club or organization: Not on file    Attends meetings of clubs or organizations: Not on file    Relationship status: Not on file  . Intimate partner violence:    Fear of current or ex partner: Not on file    Emotionally abused: Not on file    Physically abused: Not on file    Forced sexual activity: Not on file  Other Topics Concern  . Not on file  Social History Narrative   Lives w/ wife     Family History  Problem Relation Age of Onset  . Hypertension Mother   . Hypertension Father   . Benign prostatic hyperplasia Father        S/P TURP  . Heart attack Maternal Grandmother        MI in 23s  . Breast cancer Maternal Grandmother   . Arrhythmia Brother         X 2  . Heart attack Maternal Aunt        MI in 77s  . Stroke Neg Hx   . Diabetes Neg Hx   . Colon cancer Neg Hx   . Colon polyps Neg Hx   . Stomach cancer Neg Hx   . Rectal cancer Neg Hx   . Esophageal cancer Neg Hx   . Prostate cancer Neg Hx     ROS- All systems are reviewed and negative except as per the HPI above  Physical Exam: Vitals:   05/23/18 1357  BP: 122/74  Pulse: 83  Weight: 88 kg  Height: 6' (1.829 m)   Wt Readings from Last 3 Encounters:  05/23/18 88 kg  05/06/18 88.8 kg  05/02/18 88.5 kg    Labs: Lab Results  Component Value Date   NA 132 (L) 05/23/2018   K 4.1 05/23/2018   CL 98 05/23/2018   CO2 29 05/23/2018   GLUCOSE 85 05/23/2018   BUN 12 05/23/2018   CREATININE 0.97 05/23/2018   CALCIUM 8.9 05/23/2018   MG 2.0 04/18/2018   Lab Results  Component Value Date   INR 1.29 03/30/2018   Lab Results  Component Value Date   CHOL 145 06/14/2017   HDL 34.40 (L) 06/14/2017   LDLCALC 93 06/14/2017   TRIG 88.0 06/14/2017     GEN- The patient is well appearing, alert and oriented x 3 today.  Head- normocephalic, atraumatic Eyes-  Sclera clear, conjunctiva pink Ears- hearing intact Oropharynx- clear Neck- supple, no JVP Lymph- no cervical lymphadenopathy Lungs- Clear to ausculation bilaterally, normal work of breathing Heart- Regular rate and rhythm, no murmurs, rubs or gallops, PMI not laterally displaced GI- soft, NT, ND, + BS Extremities- no clubbing, cyanosis, or edema MS- no significant deformity or atrophy Skin- no rash or lesion Psych- euthymic mood, full affect Neuro- strength and sensation are intact  EKG- atrial tach/flutter at 94  bpm    Assessment and Plan: 1. Atrial tach  vrs atrail  flutter Continue flecainide 100 mg bid  and metoprolol 25 mg bid Dr. Caryl Comes did say in his last note with BBB, he would look at dose of flecainide in the near future for possible dose adjustment  2. CHA2DS2VASc score of at least 2 Denies bleeding history Will start eliquis 5 mg bid He will be eligible for cardioversion after 10/21, will get scheduled Reminded not to miss any doses of anticoagulation  I did check with vascular, they do not have any issues with anticoagulation being started but would like ASA continued for recent stent placement  Bleeding precautions discussed   F/u with Dr. Caryl Comes 1-2 weeks after cardioversion  Butch Penny C. [place], [facility] [address] [place], [place] [address] [phone]

## 2018-05-23 NOTE — Progress Notes (Addendum)
Primary Care Physician: Colon Branch, MD Referring Physician: Dr. Carlyon Shadow is a 67 y.o. male with a h/o paroxysmal afib on long standing  flecainide. He was recently seen by Dr. Caryl Comes and was found to have elevated HR. EKG showed atrial  tach with 2:1 block. He was asked to take his HR at home and if remained elevated to f/u in afib clinic for anticoagulation and set up DCCV.   Pt has h/o of ascending  aortic dissection repair 8/18 and thoracic aorta endovascular stent 03/2018.  He is back in the afib clinic, 10/14, and he continues so to be out of rhythm with an atrial tach vrs atrial flutter.  He feels fatigued.   Today, he denies symptoms of palpitations, chest pain, shortness of breath, orthopnea, PND, lower extremity edema, dizziness, presyncope, syncope, or neurologic sequela.+ fatigue  being out of rhythm   The patient is tolerating medications without difficulties and is otherwise without complaint today.   Past Medical History:  Diagnosis Date  . Allergy   . Arthritis   . Ascending aortic dissection ([place]) 03/23/2017  . Atrial fibrillation ([place])   . Cataract    removed both eyes   . GERD (gastroesophageal reflux disease)   . HYPERLIPIDEMIA   . 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 Hypertension   . Microscopic hematuria    negative cystoscopy  . NONSPECIFIC ABNORMAL ELECTROCARDIOGRAM   . Pleural effusion on right   . Pleural effusion, bilateral   . S/P aortic dissection repair 03/24/2017   Biological Bentall aortic root replacement + resection and grafting of entire ascending aorta, transverse aortic arch and proximal descending thoracic aorta with elephant trunk distal anastomosis and debranching of aortic arch vessels  . 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 Varicose veins    Past Surgical History:  Procedure Laterality Date  . APPENDECTOMY    . CARDIAC CATHETERIZATION      X3;Dr Caryl Comes, last 01/20/12: mild non-obstructive CAD, normal LV systolic function  . COLONOSCOPY    . CYSTOSCOPY  1978   Dr Hartley Barefoot  . EYE SURGERY    . IR THORACENTESIS ASP PLEURAL SPACE W/IMG GUIDE  11/25/2017  . KNEE ARTHROSCOPY  2012   Dr Theda Sers  . LUMBAR LAMINECTOMY  1987   Dr Durward Fortes  . MYELOGRAM  2007  . PERICARDIOCENTESIS N/A 04/07/2017   Procedure: PERICARDIOCENTESIS;  Surgeon: Sherren Mocha, MD;  Location: [place] [facility];  Service: Cardiovascular;  Laterality: N/A;  . POLYPECTOMY    . REPAIR OF ACUTE ASCENDING THORACIC AORTIC DISSECTION N/A 03/23/2017   Procedure: REPAIR OF ACUTE ASCENDING THORACIC AORTIC DISSECTION.  Bentall procedure.  Aortic root repleacement with bioprosthetic valve.  Reimplantation of left and right coronary arteries.  Total resection of transverse aortic arch.  Elephant trunk distal anastomosis and debranching of arch vessels.;  Surgeon: Rexene Alberts, MD;  Location: [place];  Service: Vascular;  Laterality: N/A;  . ROTATOR CUFF REPAIR    . SHOULDER ARTHROSCOPY  2011   Dr Theda Sers  . SHOULDER ARTHROSCOPY Right 08/2015  . SPINAL FUSION  1986   Dr Rolin Barry  . TEE WITHOUT CARDIOVERSION N/A 03/23/2017   Procedure: TRANSESOPHAGEAL ECHOCARDIOGRAM (TEE);  Surgeon: Rexene Alberts, MD;  Location: [place];  Service: Open Heart Surgery;  Laterality: N/A;  . THORACIC AORTIC ENDOVASCULAR STENT GRAFT N/A 03/30/2018   Procedure: THORACIC AORTIC ENDOVASCULAR STENT GRAFT WITH INTRAVASCULAR ULTRASOUND;  Surgeon: Serafina Mitchell, MD;  Location: [facility];  Service: Vascular;  Laterality: N/A;  . TRACHEOSTOMY     age 6 for croup    Current Outpatient Medications  Medication Sig Dispense Refill  . albuterol (PROAIR HFA) 108 (90 Base) MCG/ACT inhaler Inhale two puffs every four to six hours as needed for cough or wheeze. 3 Inhaler 0  .  albuterol (PROVENTIL) (2.5 MG/3ML) 0.083% nebulizer solution Take 3 mLs (2.5 mg total) by nebulization every 8 (eight) hours as needed for wheezing or shortness of breath. 150 mL 1  . ALPRAZolam (XANAX) 1 MG tablet Take 1-1.5 tablets (1-1.5 mg total) by mouth at bedtime. 40 tablet 3  . apixaban (ELIQUIS) 5 MG TABS tablet Take 1 tablet (5 mg total) by mouth 2 (two) times daily. 60 tablet 3  . aspirin EC 81 MG tablet Take 1 tablet (81 mg total) by mouth daily.    Marland Kitchen augmented betamethasone dipropionate (DIPROLENE-AF) 0.05 % cream Apply topically 2 (two) times daily as needed. 100 g 3  . azelastine (ASTELIN) 0.1 % nasal spray Can use two sprays in each nostril twice daily as directed. 90 mL 3  . diphenhydrAMINE (BENADRYL) 25 MG tablet Take 25 mg by mouth at bedtime as needed.    Marland Kitchen escitalopram (LEXAPRO) 10 MG tablet Take 1 tablet (10 mg total) by mouth daily. 30 tablet 5  . esomeprazole (NEXIUM) 40 MG capsule Take 1 capsule (40 mg total) by mouth daily before breakfast. 90 capsule 3  . flecainide (TAMBOCOR) 100 MG tablet Take 1 tablet (100 mg total) by mouth 2 (two) times daily. 180 tablet 3  . fluticasone (FLONASE) 50 MCG/ACT nasal spray Use one spray in each nostril twice daily. 48 g 3  . fluticasone furoate-vilanterol (BREO ELLIPTA) 200-25 MCG/INH AEPB Inhale 1 puff into the lungs daily. 180 each 3  . furosemide (LASIX) 40 MG tablet Take 1 tablet (40 mg total) by mouth daily. 30 tablet 5  . metoprolol tartrate (LOPRESSOR) 25 MG tablet Take 2 tablets (50 mg total) by mouth 2 (two) times daily. 180 tablet 3  . potassium chloride (K-DUR) 10 MEQ tablet Take 1 tablet (10 mEq total) by mouth daily. 30 tablet 5  . umeclidinium bromide (INCRUSE ELLIPTA) 62.5 MCG/INH AEPB Inhale 1 puff into the lungs daily.     No current facility-administered medications for this encounter.     Allergies  Allergen Reactions  . Simvastatin Other (See Comments)    Mental status changes  . Celecoxib Other (See Comments)      GI UPSET AND INFLAMMATION  . Codeine Other (See Comments)    GI UPSET AND INFLAMMATION  . Nsaids Other (See Comments)    GI UPSET AND INFLAMMATION (can tolerate via IV)  . Tape Other (See Comments)    Medical tape and Band-Aids PULL OFF THE SKIN; please use Coban wrap  . Cephalexin Itching and Rash  . Latex Rash    Social History   Socioeconomic History  . Marital status: Married    Spouse name: Not on file  . Number of children: 2  . Years of education: Not on file  . Highest education level: Not on file  Occupational History  . 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 Financial resource strain: Not on file  . Food  insecurity:    Worry: Not on file    Inability: Not on file  . Transportation needs:    Medical: Not on file    Non-medical: Not on file  Tobacco Use  . Smoking status: Former Smoker    Packs/day: 2.00    Years: 45.00    Pack years: 90.00    Types: Cigarettes    Last attempt to quit: 03/22/2017    Years since quitting: 1.1  . Smokeless tobacco: Never Used  . Tobacco comment: 2 ppd , quit 03/2017  Substance and Sexual Activity  . Alcohol use: Yes    Alcohol/week: 0.0 standard drinks    Comment: 5 cans of beer daily  . Drug use: No  . Sexual activity: Never  Lifestyle  . Physical activity:    Days per week: Not on file    Minutes per session: Not on file  . Stress: Not on file  Relationships  . Social connections:    Talks on phone: Not on file    Gets together: Not on file    Attends religious service: Not on file    Active member of club or organization: Not on file    Attends meetings of clubs or organizations: Not on file    Relationship status: Not on file  . Intimate partner violence:    Fear of current or ex partner: Not on file    Emotionally abused: Not on file    Physically abused: Not on file    Forced sexual activity: Not on file  Other Topics Concern  . Not on file  Social History Narrative   Lives w/ wife     Family History  Problem Relation Age of Onset  . Hypertension Mother   . Hypertension Father   . Benign prostatic hyperplasia Father        S/P TURP  . Heart attack Maternal Grandmother        MI in 71s  . Breast cancer Maternal Grandmother   . Arrhythmia Brother         X 2  . Heart attack Maternal Aunt        MI in 34s  . Stroke Neg Hx   . Diabetes Neg Hx   . Colon cancer Neg Hx   . Colon polyps Neg Hx   . Stomach cancer Neg Hx   . Rectal cancer Neg Hx   . Esophageal cancer Neg Hx   . Prostate cancer Neg Hx     ROS- All systems are reviewed and negative except as per the HPI above  Physical Exam: Vitals:   05/23/18 1357  BP: 122/74  Pulse: 83  Weight: 88 kg  Height: 6' (1.829 m)   Wt Readings from Last 3 Encounters:  05/23/18 88 kg  05/06/18 88.8 kg  05/02/18 88.5 kg    Labs: Lab Results  Component Value Date   NA 132 (L) 05/23/2018   K 4.1 05/23/2018   CL 98 05/23/2018   CO2 29 05/23/2018   GLUCOSE 85 05/23/2018   BUN 12 05/23/2018   CREATININE 0.97 05/23/2018   CALCIUM 8.9 05/23/2018   MG 2.0 04/18/2018   Lab Results  Component Value Date   INR 1.29 03/30/2018   Lab Results  Component Value Date   CHOL 145 06/14/2017   HDL 34.40 (L) 06/14/2017   LDLCALC 93 06/14/2017   TRIG 88.0 06/14/2017     GEN- The patient is well appearing, alert and oriented x 3 today.  Head- normocephalic, atraumatic Eyes-  Sclera clear, conjunctiva pink Ears- hearing intact Oropharynx- clear Neck- supple, no JVP Lymph- no cervical lymphadenopathy Lungs- Clear to ausculation bilaterally, normal work of breathing Heart- Regular rate and rhythm, no murmurs, rubs or gallops, PMI not laterally displaced GI- soft, NT, ND, + BS Extremities- no clubbing, cyanosis, or edema MS- no significant deformity or atrophy Skin- no rash or lesion Psych- euthymic mood, full affect Neuro- strength and sensation are intact  EKG- atrial tach/flutter at 94  bpm    Assessment and Plan: 1. Atrial tach  vrs atrail  flutter Continue flecainide 100 mg bid  and metoprolol 25 mg bid Dr. Caryl Comes did say in his last note with [facility], he would look at dose of flecainide in the near future for possible dose adjustment  2. CHA2DS2VASc score of at least 2 Denies bleeding history Will start eliquis 5 mg bid He will be eligible for cardioversion after 10/21, will get scheduled Reminded not to miss any doses of anticoagulation  I did check with vascular, they do not have any issues with anticoagulation being started but would like ASA continued for recent stent placement  Bleeding precautions discussed   F/u with Dr. Caryl Comes 1-2 weeks after cardioversion  Butch Penny C. Erika Slaby, [facility] [address] [place], [place] [address] [phone]

## 2018-05-31 ENCOUNTER — Encounter (HOSPITAL_COMMUNITY)
Admission: RE | Disposition: A | Discharge status: Home / Self Care | Payer: Self-pay | Source: Ambulatory Visit | Attending: Cardiology

## 2018-05-31 ENCOUNTER — Ambulatory Visit (HOSPITAL_COMMUNITY): Payer: Medicare Other | Admitting: Anesthesiology

## 2018-05-31 ENCOUNTER — Encounter (HOSPITAL_COMMUNITY): Payer: Self-pay | Admitting: *Deleted

## 2018-05-31 ENCOUNTER — Ambulatory Visit (HOSPITAL_COMMUNITY)
Admission: RE | Admit: 2018-05-31 | Discharge: 2018-05-31 | Disposition: A | Discharge status: Home / Self Care | Payer: Medicare Other | Source: Ambulatory Visit | Attending: Cardiology | Admitting: Cardiology

## 2018-05-31 ENCOUNTER — Other Ambulatory Visit: Payer: Self-pay

## 2018-05-31 DIAGNOSIS — I471 Supraventricular tachycardia: Secondary | ICD-10-CM

## 2018-05-31 DIAGNOSIS — I4719 Other supraventricular tachycardia: Secondary | ICD-10-CM

## 2018-05-31 DIAGNOSIS — J90 Pleural effusion, not elsewhere classified: Secondary | ICD-10-CM | POA: Insufficient documentation

## 2018-05-31 DIAGNOSIS — Z7901 Long term (current) use of anticoagulants: Secondary | ICD-10-CM | POA: Insufficient documentation

## 2018-05-31 DIAGNOSIS — I484 Atypical atrial flutter: Secondary | ICD-10-CM | POA: Diagnosis not present

## 2018-05-31 DIAGNOSIS — K219 Gastro-esophageal reflux disease without esophagitis: Secondary | ICD-10-CM | POA: Insufficient documentation

## 2018-05-31 DIAGNOSIS — I4891 Unspecified atrial fibrillation: Secondary | ICD-10-CM | POA: Insufficient documentation

## 2018-05-31 DIAGNOSIS — M1990 Unspecified osteoarthritis, unspecified site: Secondary | ICD-10-CM | POA: Diagnosis not present

## 2018-05-31 DIAGNOSIS — Z8249 Family history of ischemic heart disease and other diseases of the circulatory system: Secondary | ICD-10-CM | POA: Diagnosis not present

## 2018-05-31 DIAGNOSIS — Z7982 Long term (current) use of aspirin: Secondary | ICD-10-CM | POA: Insufficient documentation

## 2018-05-31 DIAGNOSIS — I452 Bifascicular block: Secondary | ICD-10-CM | POA: Diagnosis not present

## 2018-05-31 DIAGNOSIS — E785 Hyperlipidemia, unspecified: Secondary | ICD-10-CM | POA: Insufficient documentation

## 2018-05-31 DIAGNOSIS — Z7951 Long term (current) use of inhaled steroids: Secondary | ICD-10-CM | POA: Diagnosis not present

## 2018-05-31 DIAGNOSIS — D649 Anemia, unspecified: Secondary | ICD-10-CM | POA: Diagnosis not present

## 2018-05-31 DIAGNOSIS — I10 Essential (primary) hypertension: Secondary | ICD-10-CM | POA: Diagnosis not present

## 2018-05-31 DIAGNOSIS — Z87891 Personal history of nicotine dependence: Secondary | ICD-10-CM | POA: Insufficient documentation

## 2018-05-31 DIAGNOSIS — I4819 Other persistent atrial fibrillation: Secondary | ICD-10-CM | POA: Insufficient documentation

## 2018-05-31 HISTORY — PX: CARDIOVERSION: SHX1299

## 2018-05-31 SURGERY — CARDIOVERSION
Anesthesia: General

## 2018-05-31 MED ORDER — ALPRAZOLAM 1 MG PO TABS: 1.0000 mg | ORAL_TABLET | Freq: Every day | ORAL | Status: DC

## 2018-05-31 MED ORDER — SODIUM CHLORIDE 0.9 % IV SOLN
INTRAVENOUS | Status: DC | PRN
  Administered 2018-05-31: 13:00:00 via INTRAVENOUS

## 2018-05-31 MED ORDER — FLUTICASONE FUROATE-VILANTEROL 200-25 MCG/INH IN AEPB: 1.0000 | INHALATION_SPRAY | Freq: Every day | RESPIRATORY_TRACT | Status: DC

## 2018-05-31 MED ORDER — FLUTICASONE PROPIONATE 50 MCG/ACT NA SUSP: 1.0000 | Freq: Two times a day (BID) | NASAL | Status: AC | PRN

## 2018-05-31 MED ORDER — LIDOCAINE HCL (CARDIAC) PF 100 MG/5ML IV SOSY
PREFILLED_SYRINGE | INTRAVENOUS | Status: DC | PRN
  Administered 2018-05-31: 60 mg via INTRATRACHEAL

## 2018-05-31 MED ORDER — PROPOFOL 10 MG/ML IV BOLUS
INTRAVENOUS | Status: DC | PRN
  Administered 2018-05-31 (×2): 50 mg via INTRAVENOUS

## 2018-05-31 MED ORDER — AZELASTINE HCL 0.1 % NA SOLN: 2.0000 | Freq: Two times a day (BID) | NASAL | Status: DC | PRN

## 2018-05-31 MED ORDER — BETAMETHASONE DIPROPIONATE AUG 0.05 % EX CREA: 1.0000 "application " | TOPICAL_CREAM | Freq: Two times a day (BID) | CUTANEOUS | Status: DC | PRN

## 2018-05-31 NOTE — CV Procedure (Signed)
   Electrical Cardioversion Procedure Note BRAZEN DOMANGUE [id number] 01-06-1951  Procedure: Electrical Cardioversion Indications:  Atrial Flutter/atrial tachycardia  Time Out: Verified patient identification, verified procedure,medications/allergies/relevent history reviewed, required imaging and test results available.  Performed  Procedure Details  The patient was NPO after midnight. Anesthesia was administered at the beside  by Dr.Ellender with 100mg  of propofol and 60mg  of lidocaine.  Cardioversion was done with synchronized biphasic defibrillation with AP pads with 150watts.  The patient converted to normal sinus rhythm. The patient tolerated the procedure well   IMPRESSION:  Successful cardioversion of atrial flutter/atrial tachycardia    Traci Turner 05/31/2018, 12:34 PM

## 2018-05-31 NOTE — Transfer of Care (Signed)
Immediate Anesthesia Transfer of Care Note  Patient: Sean Riley  Procedure(s) Performed: CARDIOVERSION (N/A )  Patient Location: Endoscopy Unit  Anesthesia Type:General  Level of Consciousness: awake, alert  and oriented  Airway & Oxygen Therapy: Patient Spontanous Breathing and Patient connected to nasal cannula oxygen  Post-op Assessment: Report given to RN and Post -op Vital signs reviewed and stable  Post vital signs: Reviewed and stable  Last Vitals:  Vitals Value Taken Time  BP    Temp    Pulse    Resp    SpO2      Last Pain:  Vitals:   05/31/18 1305  TempSrc: Oral  PainSc: 0-No pain         Complications: No apparent anesthesia complications

## 2018-05-31 NOTE — Interval H&P Note (Signed)
History and Physical Interval Note:  05/31/2018 12:34 PM  Sean Riley  has presented today for surgery, with the diagnosis of a-fib  The various methods of treatment have been discussed with the patient and family. After consideration of risks, benefits and other options for treatment, the patient has consented to  Procedure(s): CARDIOVERSION (N/A) as a surgical intervention .  The patient's history has been reviewed, patient examined, no change in status, stable for surgery.  I have reviewed the patient's chart and labs.  Questions were answered to the patient's satisfaction.     Fransico Him

## 2018-05-31 NOTE — Anesthesia Procedure Notes (Signed)
Procedure Name: General with mask airway Performed by: Mariea Clonts, CRNA Pre-anesthesia Checklist: Patient identified, Emergency Drugs available, Suction available, Patient being monitored and Timeout performed Patient Re-evaluated:Patient Re-evaluated prior to induction Oxygen Delivery Method: Ambu bag Preoxygenation: Pre-oxygenation with 100% oxygen

## 2018-05-31 NOTE — Discharge Instructions (Signed)
Monitored Anesthesia Care Anesthesia is a term that refers to techniques, procedures, and medicines that help a person stay safe and comfortable during a medical procedure. Monitored anesthesia care, or sedation, is one type of anesthesia. Your anesthesia specialist may recommend sedation if you will be having a procedure that does not require you to be unconscious, such as:  Cataract surgery.  A dental procedure.  A biopsy.  A colonoscopy.  During the procedure, you may receive a medicine to help you relax (sedative). There are three levels of sedation:  Mild sedation. At this level, you may feel awake and relaxed. You will be able to follow directions.  Moderate sedation. At this level, you will be sleepy. You may not remember the procedure.  Deep sedation. At this level, you will be asleep. You will not remember the procedure.  The more medicine you are given, the deeper your level of sedation will be. Depending on how you respond to the procedure, the anesthesia specialist may change your level of sedation or the type of anesthesia to fit your needs. An anesthesia specialist will monitor you closely during the procedure. Let your health care provider know about:  Any allergies you have.  All medicines you are taking, including vitamins, herbs, eye drops, creams, and over-the-counter medicines.  Any use of steroids (by mouth or as a cream).  Any problems you or family members have had with sedatives and anesthetic medicines.  Any blood disorders you have.  Any surgeries you have had.  Any medical conditions you have, such as sleep apnea.  Whether you are pregnant or may be pregnant.  Any use of cigarettes, alcohol, or street drugs. What are the risks? Generally, this is a safe procedure. However, problems may occur, including:  Getting too much medicine (oversedation).  Nausea.  Allergic reaction to medicines.  Trouble breathing. If this happens, a breathing tube  may be used to help with breathing. It will be removed when you are awake and breathing on your own.  Heart trouble.  Lung trouble.  Before the procedure Staying hydrated Follow instructions from your health care provider about hydration, which may include:  Up to 2 hours before the procedure - you may continue to drink clear liquids, such as water, clear fruit juice, black coffee, and plain tea.  Eating and drinking restrictions Follow instructions from your health care provider about eating and drinking, which may include:  8 hours before the procedure - stop eating heavy meals or foods such as meat, fried foods, or fatty foods.  6 hours before the procedure - stop eating light meals or foods, such as toast or cereal.  6 hours before the procedure - stop drinking milk or drinks that contain milk.  2 hours before the procedure - stop drinking clear liquids.  Medicines Ask your health care provider about:  Changing or stopping your regular medicines. This is especially important if you are taking diabetes medicines or blood thinners.  Taking medicines such as aspirin and ibuprofen. These medicines can thin your blood. Do not take these medicines before your procedure if your health care provider instructs you not to.  Tests and exams  You will have a physical exam.  You may have blood tests done to show: ? How well your kidneys and liver are working. ? How well your blood can clot.  General instructions  Plan to have someone take you home from the hospital or clinic.  If you will be going home right after the  procedure, plan to have someone with you for 24 hours.  What happens during the procedure?  Your blood pressure, heart rate, breathing, level of pain and overall condition will be monitored.  An IV tube will be inserted into one of your veins.  Your anesthesia specialist will give you medicines as needed to keep you comfortable during the procedure. This may  mean changing the level of sedation.  The procedure will be performed. After the procedure  Your blood pressure, heart rate, breathing rate, and blood oxygen level will be monitored until the medicines you were given have worn off.  Do not drive for 24 hours if you received a sedative.  You may: ? Feel sleepy, clumsy, or nauseous. ? Feel forgetful about what happened after the procedure. ? Have a sore throat if you had a breathing tube during the procedure. ? Vomit. This information is not intended to replace advice given to you by your health care provider. Make sure you discuss any questions you have with your health care provider. Document Released: 04/22/2005 Document Revised: 01/03/2016 Document Reviewed: 11/17/2015 Elsevier Interactive Patient Education  2018 [employer]. Electrical Cardioversion, Care After This sheet gives you information about how to care for yourself after your procedure. Your health care provider may also give you more specific instructions. If you have problems or questions, contact your health care provider. What can I expect after the procedure? After the procedure, it is common to have:  Some redness on the skin where the shocks were given.  Follow these instructions at home:  Do not drive for 24 hours if you were given a medicine to help you relax (sedative).  Take over-the-counter and prescription medicines only as told by your health care provider.  Ask your health care provider how to check your pulse. Check it often.  Rest for 48 hours after the procedure or as told by your health care provider.  Avoid or limit your caffeine use as told by your health care provider. Contact a health care provider if:  You feel like your heart is beating too quickly or your pulse is not regular.  You have a serious muscle cramp that does not go away. Get help right away if:  You have discomfort in your chest.  You are dizzy or you feel faint.  You  have trouble breathing or you are short of breath.  Your speech is slurred.  You have trouble moving an arm or leg on one side of your body.  Your fingers or toes turn cold or blue. This information is not intended to replace advice given to you by your health care provider. Make sure you discuss any questions you have with your health care provider. Document Released: 05/17/2013 Document Revised: 02/28/2016 Document Reviewed: 01/31/2016 Elsevier Interactive Patient Education  [employer].

## 2018-05-31 NOTE — Anesthesia Preprocedure Evaluation (Addendum)
Anesthesia Evaluation  Patient identified by MRN, date of birth, ID band Patient awake    Reviewed: Allergy & Precautions, NPO status , Patient's Chart, lab work & pertinent test results  Airway Mallampati: II  TM Distance: >3 FB Neck ROM: Full    Dental no notable dental hx.    Pulmonary COPD, former smoker,    Pulmonary exam normal breath sounds clear to auscultation       Cardiovascular hypertension, Pt. on home beta blockers Normal cardiovascular exam+ dysrhythmias Atrial Fibrillation  Rhythm:Irregular Rate:Normal  ECG: Rate 83. Sinus rhythm with 1st degree A-V block Right bundle branch block Left anterior fascicular block * Bifascicular block *   Neuro/Psych PSYCHIATRIC DISORDERS Depression negative neurological ROS     GI/Hepatic Neg liver ROS, GERD  Medicated,  Endo/Other  negative endocrine ROS  Renal/GU negative Renal ROS     Musculoskeletal negative musculoskeletal ROS (+)   Abdominal   Peds  Hematology  (+) anemia , HLD   Anesthesia Other Findings a-fib  Reproductive/Obstetrics                            Anesthesia Physical Anesthesia Plan  ASA: III  Anesthesia Plan: General   Post-op Pain Management:    Induction: Intravenous  PONV Risk Score and Plan: 2 and Treatment may vary due to age or medical condition and Propofol infusion  Airway Management Planned: Mask  Additional Equipment:   Intra-op Plan:   Post-operative Plan:   Informed Consent: I have reviewed the patients History and Physical, chart, labs and discussed the procedure including the risks, benefits and alternatives for the proposed anesthesia with the patient or authorized representative who has indicated his/her understanding and acceptance.   Dental advisory given  Plan Discussed with: CRNA  Anesthesia Plan Comments:         Anesthesia Quick Evaluation

## 2018-06-01 ENCOUNTER — Encounter (HOSPITAL_COMMUNITY): Payer: Self-pay | Admitting: Cardiology

## 2018-06-01 NOTE — Anesthesia Postprocedure Evaluation (Signed)
Anesthesia Post Note  Patient: CRIT OBREMSKI  Procedure(s) Performed: CARDIOVERSION (N/A )     Patient location during evaluation: PACU Anesthesia Type: General Level of consciousness: awake and alert Pain management: pain level controlled Vital Signs Assessment: post-procedure vital signs reviewed and stable Respiratory status: spontaneous breathing, nonlabored ventilation, respiratory function stable and patient connected to nasal cannula oxygen Cardiovascular status: blood pressure returned to baseline and stable Postop Assessment: no apparent nausea or vomiting Anesthetic complications: no    Last Vitals:  Vitals:   05/31/18 1340 05/31/18 1350  BP: 118/60 (!) 139/56  Pulse: 76 (!) 59  Resp: 11 (!) 21  Temp: 36.4 C   SpO2: 99% 93%    Last Pain:  Vitals:   05/31/18 1350  TempSrc:   PainSc: 0-No pain                 Ryan P Ellender

## 2018-06-08 ENCOUNTER — Ambulatory Visit (HOSPITAL_COMMUNITY)
Admission: RE | Admit: 2018-06-08 | Discharge: 2018-06-08 | Disposition: A | Discharge status: Home / Self Care | Payer: Medicare Other | Source: Ambulatory Visit | Attending: Internal Medicine | Admitting: Internal Medicine

## 2018-06-08 ENCOUNTER — Encounter: Payer: Self-pay | Admitting: Internal Medicine

## 2018-06-08 ENCOUNTER — Ambulatory Visit: Payer: Medicare Other | Admitting: Internal Medicine

## 2018-06-08 ENCOUNTER — Ambulatory Visit (INDEPENDENT_AMBULATORY_CARE_PROVIDER_SITE_OTHER): Payer: Medicare Other | Admitting: Internal Medicine

## 2018-06-08 VITALS — BP 136/70 | HR 54 | Ht 72.0 in | Wt 195.0 lb

## 2018-06-08 DIAGNOSIS — R0602 Shortness of breath: Secondary | ICD-10-CM | POA: Diagnosis not present

## 2018-06-08 DIAGNOSIS — Z952 Presence of prosthetic heart valve: Secondary | ICD-10-CM | POA: Diagnosis not present

## 2018-06-08 DIAGNOSIS — I10 Essential (primary) hypertension: Secondary | ICD-10-CM

## 2018-06-08 DIAGNOSIS — I5030 Unspecified diastolic (congestive) heart failure: Secondary | ICD-10-CM

## 2018-06-08 DIAGNOSIS — I517 Cardiomegaly: Secondary | ICD-10-CM | POA: Insufficient documentation

## 2018-06-08 DIAGNOSIS — J90 Pleural effusion, not elsewhere classified: Secondary | ICD-10-CM | POA: Diagnosis not present

## 2018-06-08 DIAGNOSIS — Z951 Presence of aortocoronary bypass graft: Secondary | ICD-10-CM | POA: Insufficient documentation

## 2018-06-08 DIAGNOSIS — I7103 Dissection of thoracoabdominal aorta: Secondary | ICD-10-CM | POA: Diagnosis not present

## 2018-06-08 DIAGNOSIS — I480 Paroxysmal atrial fibrillation: Secondary | ICD-10-CM | POA: Diagnosis not present

## 2018-06-08 DIAGNOSIS — R05 Cough: Secondary | ICD-10-CM | POA: Diagnosis not present

## 2018-06-08 DIAGNOSIS — I6529 Occlusion and stenosis of unspecified carotid artery: Secondary | ICD-10-CM

## 2018-06-08 DIAGNOSIS — R918 Other nonspecific abnormal finding of lung field: Secondary | ICD-10-CM | POA: Insufficient documentation

## 2018-06-08 MED ORDER — METOPROLOL TARTRATE 25 MG PO TABS: 25.0000 mg | ORAL_TABLET | Freq: Two times a day (BID) | ORAL | 3 refills | Status: DC

## 2018-06-08 NOTE — Patient Instructions (Addendum)
Medication Instructions:  Your physician has recommended you make the following change in your medication:  1. DECREASE Metoprolol tartrate (Lopressor) to 25 mg twice daily 2. CHANGE the way you take your Lasix:  - take 40 mg twice daily for 3 days, then  - alternate taking 40 mg once a day, next day 40 mg twice a day, next day 40 mg once, next day 40 mg twice, etc. UNTIL your weight gets down to 180 pounds.  Then return to normal dosing of 40 mg once daily 3. INCREASE your Potassium as you increase your Lasix - as instructed  If you need a refill on your cardiac medications before your next appointment, please call your pharmacy.   Lab work: Today: BNP If you have labs (blood work) drawn today and your tests are completely normal, you will receive your results only by: Marland Kitchen MyChart Message (if you have MyChart) OR . A paper copy in the mail If you have any lab test that is abnormal or we need to change your treatment, we will call you to review the results.  Testing/Procedures: A chest x-ray takes a picture of the organs and structures inside the chest, including the heart, lungs, and blood vessels. This test can show several things, including, whether the heart is enlarges; whether fluid is building up in the lungs; and whether pacemaker / defibrillator leads are still in place.  Follow-Up: At [facility], you and your health needs are our priority.  As part of our continuing mission to provide you with exceptional heart care, we have created designated Provider Care Teams.  These Care Teams include your primary Cardiologist (physician) and Advanced Practice Providers (APPs -  Physician Assistants and Nurse Practitioners) who all work together to provide you with the care you need, when you need it. You will need a follow up appointment in 6-8 weeks.   You may see Dr. Caryl Comes or one of the following Advanced Practice Providers on your designated Care Team:   . Tommye Standard, PA-C  Thank you  for choosing [facility]!!

## 2018-06-08 NOTE — Progress Notes (Signed)
Patient Care Team: Colon Branch, MD as PCP - General (Internal Medicine) Deboraha Sprang, MD as Consulting Physician (Cardiology) Darleen Crocker, MD as Consulting Physician (Ophthalmology) Loletha Carrow Kirke Corin, MD as Consulting Physician (Gastroenterology) Neldon Mc Donnamarie Poag, MD as Consulting Physician (Allergy and Immunology) Rexene Alberts, MD as Consulting Physician (Cardiothoracic Surgery)   HPI  Sean Riley is a 67 y.o. male Seen With Chief Complaint of  atrial fibrillation. He continues to take flecainide; he's been largely asymptomatic without symptomatic recurrences.     Pulmonary function testing 5/15 demonstrated mild emphysema..  There has been no syncope or presyncope.     Hospitalized 8/18 for aortic dissection w root replacement, prosthetic AVR and reconstruction of entire ascending aorta cx by pericardial effusion requiring pericardiocentesis and some R hand injury.  Subsequently developed worsening ascending aortic dilatation and underwent endovascular repair 8/19+  DATE TEST EF   2013/  MYOVIEW   58 % Abnormal  2013 [facility]    % Normal CAs  12/16 MYOVIEW 48%   9/18 Echo  50-55% Bioprosthetic AVR     Date PR QRS Flecainide  5/12  112   5/15  116 100  5/16  132 100  6/17  132 100  12/17  136 100  9/18 166 144 100  1/19 180 146 100  5/19 176 156 100  9/19`  170 100  10/19 174 162 100   When seen 9/19 he was found to have atrial tachycardia.  Efforts to pace terminate failed when he went into atrial fibrillation which we thought would revert to sinus spontaneously.  Unfortunately he reverted to atrial tachycardia.  He was seen by DC-NP started on anticoagulation and submitted for cardioversion 10/22-successful  He is better.  However, he is continued to accumulate fluid.  He drinks copiously Dr. Malachi Bonds and beer  He notes peripheral edema and some associated erythema.  Also coughing dyspnea on exertion but no chest pain.  Thromboembolic risk factors (  age -9, HTN-1, Vasc disease -1) for a CHADSVASc Score of 3    Past Medical History:  Diagnosis Date  . Allergy   . Arthritis   . Ascending aortic dissection ([place]) 03/23/2017  . Atrial fibrillation ([place])   . Cataract    removed both eyes   . GERD (gastroesophageal reflux disease)   . HYPERLIPIDEMIA   . 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 Hypertension   . Microscopic hematuria    negative cystoscopy  . NONSPECIFIC ABNORMAL ELECTROCARDIOGRAM   . Pleural effusion on right   . Pleural effusion, bilateral   . S/P aortic dissection repair 03/24/2017   Biological Bentall aortic root replacement + resection and grafting of entire ascending aorta, transverse aortic arch and proximal descending thoracic aorta with elephant trunk distal anastomosis and debranching of aortic arch vessels  . 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 Varicose veins     Past Surgical History:  Procedure Laterality Date  . APPENDECTOMY    . CARDIAC CATHETERIZATION      X3;Dr Caryl Comes, last 01/20/12: mild non-obstructive CAD, normal LV systolic function  . CARDIOVERSION N/A 05/31/2018   Procedure: CARDIOVERSION;  Surgeon: Sueanne Margarita, MD;  Location: [facility] ENDOSCOPY;  Service: Cardiovascular;  Laterality: N/A;  . COLONOSCOPY    .  CYSTOSCOPY  1978   Dr Hartley Barefoot  . EYE SURGERY    . IR THORACENTESIS ASP PLEURAL SPACE W/IMG GUIDE  11/25/2017  . KNEE ARTHROSCOPY  2012   Dr Theda Sers  . LUMBAR LAMINECTOMY  1987   Dr Durward Fortes  . MYELOGRAM  2007  . PERICARDIOCENTESIS N/A 04/07/2017   Procedure: PERICARDIOCENTESIS;  Surgeon: Sherren Mocha, MD;  Location: [place] CV LAB;  Service: Cardiovascular;  Laterality: N/A;  . POLYPECTOMY    . REPAIR OF ACUTE ASCENDING THORACIC AORTIC DISSECTION N/A 03/23/2017    Procedure: REPAIR OF ACUTE ASCENDING THORACIC AORTIC DISSECTION.  Bentall procedure.  Aortic root repleacement with bioprosthetic valve.  Reimplantation of left and right coronary arteries.  Total resection of transverse aortic arch.  Elephant trunk distal anastomosis and debranching of arch vessels.;  Surgeon: Rexene Alberts, MD;  Location: [place];  Service: Vascular;  Laterality: N/A;  . ROTATOR CUFF REPAIR    . SHOULDER ARTHROSCOPY  2011   Dr Theda Sers  . SHOULDER ARTHROSCOPY Right 08/2015  . SPINAL FUSION  1986   Dr Rolin Barry  . TEE WITHOUT CARDIOVERSION N/A 03/23/2017   Procedure: TRANSESOPHAGEAL ECHOCARDIOGRAM (TEE);  Surgeon: Rexene Alberts, MD;  Location: [place];  Service: Open Heart Surgery;  Laterality: N/A;  . THORACIC AORTIC ENDOVASCULAR STENT GRAFT N/A 03/30/2018   Procedure: THORACIC AORTIC ENDOVASCULAR STENT GRAFT WITH INTRAVASCULAR ULTRASOUND;  Surgeon: Serafina Mitchell, MD;  Location: [facility] OR;  Service: Vascular;  Laterality: N/A;  . TRACHEOSTOMY     age 77 for croup    Current Outpatient Medications  Medication Sig Dispense Refill  . albuterol (PROAIR HFA) 108 (90 Base) MCG/ACT inhaler Inhale two puffs every four to six hours as needed for cough or wheeze. 3 Inhaler 0  . albuterol (PROVENTIL) (2.5 MG/3ML) 0.083% nebulizer solution Take 3 mLs (2.5 mg total) by nebulization every 8 (eight) hours as needed for wheezing or shortness of breath. 150 mL 1  . ALPRAZolam (XANAX) 1 MG tablet Take 1 tablet (1 mg total) by mouth at bedtime.    Marland Kitchen apixaban (ELIQUIS) 5 MG TABS tablet Take 1 tablet (5 mg total) by mouth 2 (two) times daily. 60 tablet 3  . aspirin EC 81 MG tablet Take 1 tablet (81 mg total) by mouth daily.    Marland Kitchen augmented betamethasone dipropionate (DIPROLENE-AF) 0.05 % cream Apply 1 application topically 2 (two) times daily as needed (itchy skin.).    Marland Kitchen azelastine (ASTELIN) 0.1 % nasal spray Place 2 sprays into both nostrils 2 (two) times daily as needed for allergies.    .  diphenhydrAMINE (BENADRYL) 25 MG tablet Take 25 mg by mouth every 8 (eight) hours as needed (allergies/runny nose).     Marland Kitchen escitalopram (LEXAPRO) 10 MG tablet Take 1 tablet (10 mg total) by mouth daily. 30 tablet 5  . esomeprazole (NEXIUM) 40 MG capsule Take 1 capsule (40 mg total) by mouth daily before breakfast. 90 capsule 3  . flecainide (TAMBOCOR) 100 MG tablet Take 1 tablet (100 mg total) by mouth 2 (two) times daily. 180 tablet 3  . fluticasone (FLONASE) 50 MCG/ACT nasal spray Place 1 spray into both nostrils 2 (two) times daily as needed for allergies. Use one spray in each nostril twice daily.    . fluticasone furoate-vilanterol (BREO ELLIPTA) 200-25 MCG/INH AEPB Inhale 1 puff into the lungs at bedtime.    . furosemide (LASIX) 40 MG tablet Take 1 tablet (40 mg total) by mouth daily. 30 tablet 5  . metoprolol  tartrate (LOPRESSOR) 25 MG tablet Take 2 tablets (50 mg total) by mouth 2 (two) times daily. 180 tablet 3  . potassium chloride (K-DUR) 10 MEQ tablet Take 1 tablet (10 mEq total) by mouth daily. 30 tablet 5  . umeclidinium bromide (INCRUSE ELLIPTA) 62.5 MCG/INH AEPB Inhale 1 puff into the lungs at bedtime.      No current facility-administered medications for this visit.     Allergies  Allergen Reactions  . Simvastatin Other (See Comments)    Mental status changes  . Celecoxib Other (See Comments)    GI UPSET AND INFLAMMATION  . Codeine Other (See Comments)    GI UPSET AND INFLAMMATION  . Nsaids Other (See Comments)    GI UPSET AND INFLAMMATION (can tolerate via IV)  . Tape Other (See Comments)    Medical tape and Band-Aids PULL OFF THE SKIN; please use Coban wrap  . Cephalexin Itching and Rash  . Latex Rash    Review of Systems negative except from HPI and PMH  Physical Exam BP 136/70   Pulse (!) 54   Ht 6' (1.829 m)   Wt 195 lb (88.5 kg)   SpO2 91%   BMI 26.45 kg/m  Well developed and nourished in no acute distress HENT normal Neck supple with JVP 7-8  cm Crackles B  Regular rate and rhythm, no murmurs or gallops Abd-soft with active BS No Clubbing cyanosis edema Skin-warm and dry+L>R edema A & Oriented  Grossly normal sensory and motor function      ECG   sinus at 54 Intervals 14/13/54 Right bundle branch block  Assessment and Plan  Atrial tachycardia and fibrillation  Hypertension  Aortic dissection status post reconstruction   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  We will continue flecainide.  IQRS stable   We will make decisions regarding alternative therapies i.e. ablation or alternative drugs depending on the frequency of recurrences.  Right now he is volume overloaded.  We will increase his diuretics from 40 daily--40 twice daily for 3 days and then alternating until he gets his dry weight of 180.  Encouraged to decrease his fluid intake.  We will see again in 6 weeks

## 2018-06-09 ENCOUNTER — Telehealth: Payer: Self-pay

## 2018-06-09 DIAGNOSIS — Z79899 Other long term (current) drug therapy: Secondary | ICD-10-CM

## 2018-06-09 LAB — PRO B NATRIURETIC PEPTIDE: NT-Pro BNP: 1676 pg/mL — ABNORMAL HIGH (ref 0–376)

## 2018-06-09 MED ORDER — FUROSEMIDE 40 MG PO TABS: 40.0000 mg | ORAL_TABLET | Freq: Two times a day (BID) | ORAL | 3 refills | Status: DC

## 2018-06-09 NOTE — Telephone Encounter (Signed)
Notes recorded by Frederik Schmidt, RN on 06/09/2018 at 11:01 AM EDT Preliminarily reviewed. Forwarded to MD desktop for review and signature. Reviewed with Dr Meda Coffee (DOD) and she recommended Lasix 40 mg bid and repeat BNP/BMP November 14. I informed patient's wife.

## 2018-06-09 NOTE — Addendum Note (Signed)
Encounter addended by: Sherran Needs, NP on: 06/09/2018 9:28 AM  Actions taken: LOS modified

## 2018-06-11 ENCOUNTER — Telehealth: Payer: Self-pay | Admitting: Internal Medicine

## 2018-06-12 IMAGING — CT CT ANGIO CHEST-ABD-PELV FOR DISSECTION W/ AND WO/W CM
2 of 6 series · 12 of 46 positions shown, 14 images · IV contrast (OMNI 350)
Comparison: A [DATE]

CLINICAL DATA: Eight days status postoperative repair of aortic
dissection. Bentall procedure. Aortic root replacement with
prosthetic valve.

EXAM:
CT ANGIOGRAPHY CHEST, ABDOMEN AND PELVIS
TECHNIQUE: Multidetector CT imaging through the chest, abdomen and pelvis was
performed using the standard protocol during bolus administration of
intravenous contrast. Multiplanar reconstructed images and MIPs were
obtained and reviewed to evaluate the vascular anatomy.
CONTRAST:  100 cc Isovue 370

[Series 5: dissection 2mm · axial · 0.90mm/px · z∈[+717,+1307]mm · 9 of 369 slices shown, 11 images]
[im 37/369  soft-tissue]
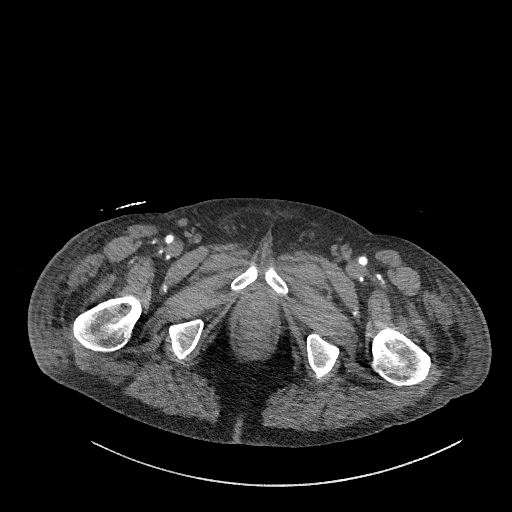
[im 37/369  bone]
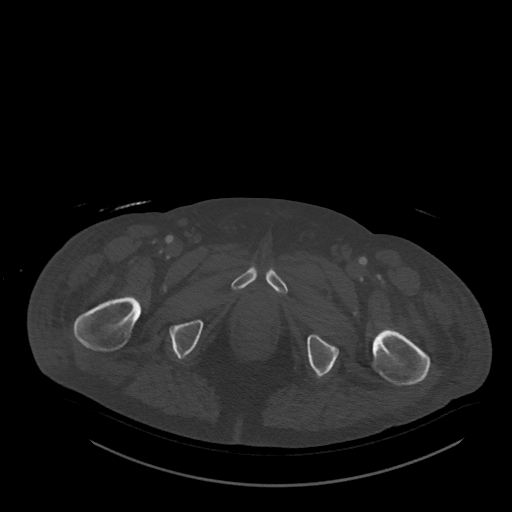
[im 74/369  soft-tissue]
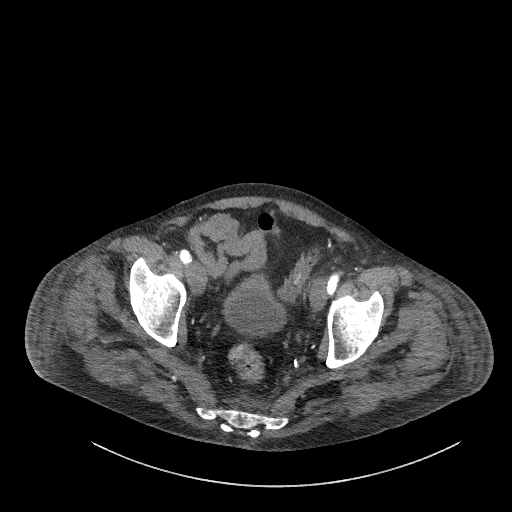
[im 111/369  soft-tissue]
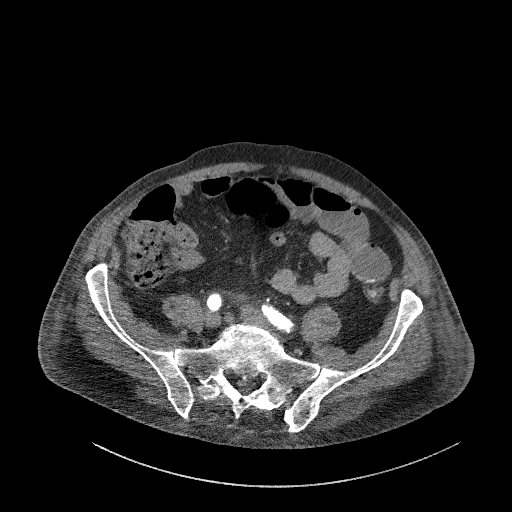
[im 148/369  soft-tissue]
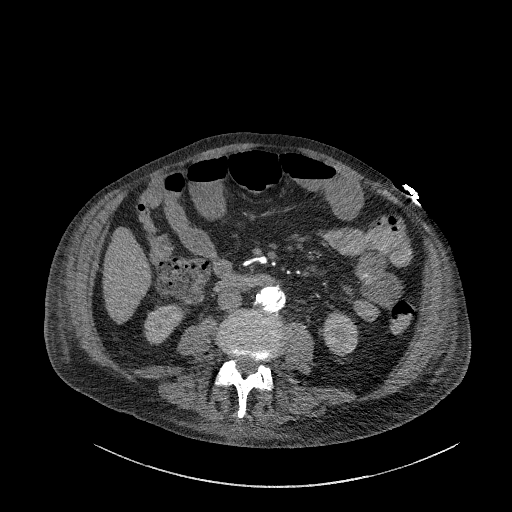
[im 185/369  soft-tissue]
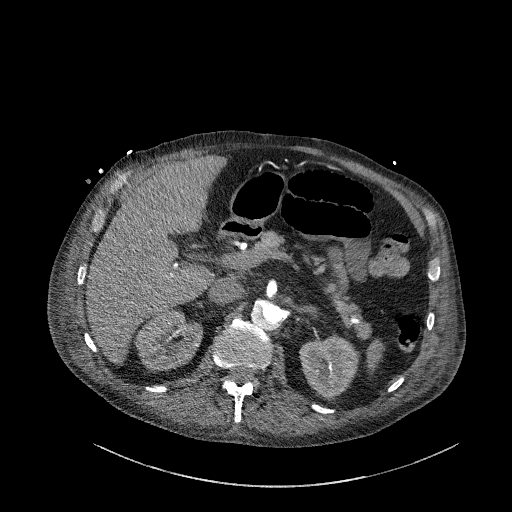
[im 221/369  soft-tissue]
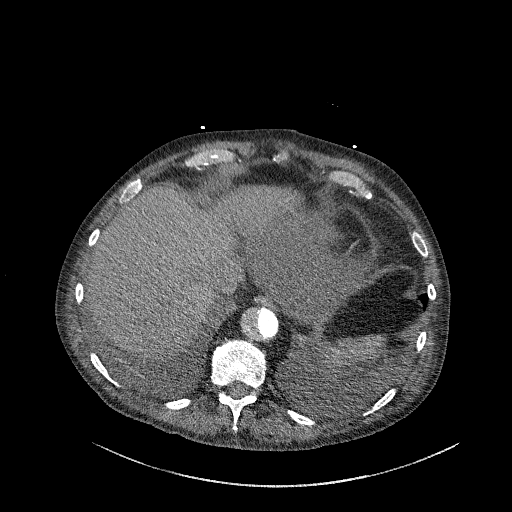
[im 258/369  soft-tissue]
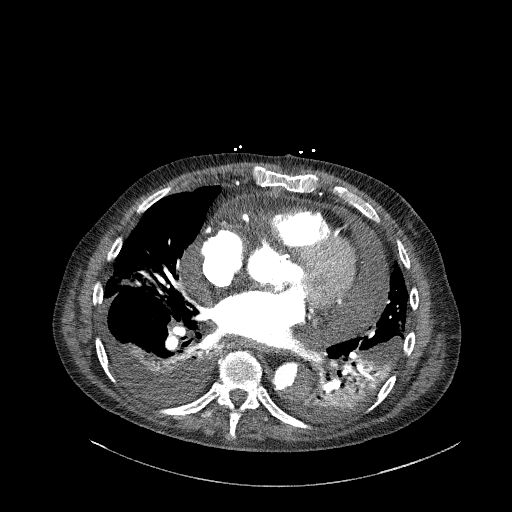
[im 295/369  soft-tissue]
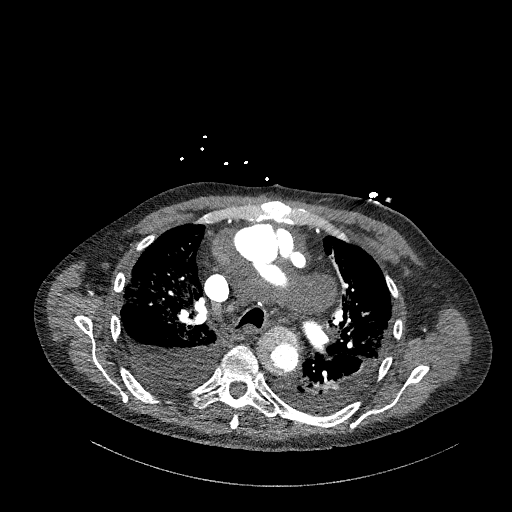
[im 332/369  soft-tissue]
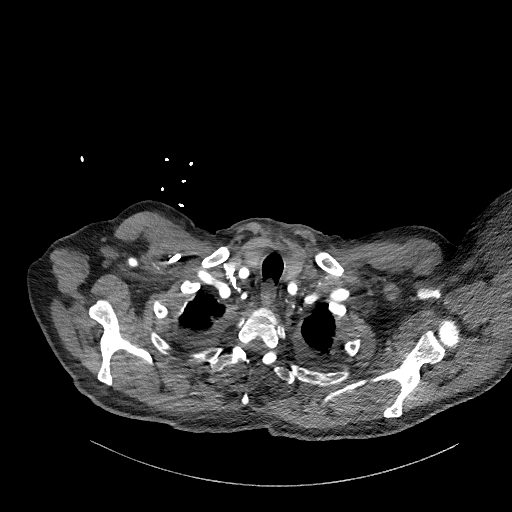
[im 332/369  bone]
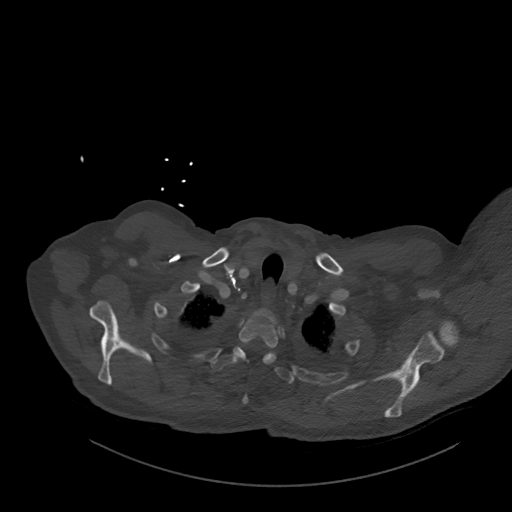

[Series 8: dissection 2mm cor · coronal · 0.91mm/px · 3 of 151 slices shown]
[im 38/151  soft-tissue]
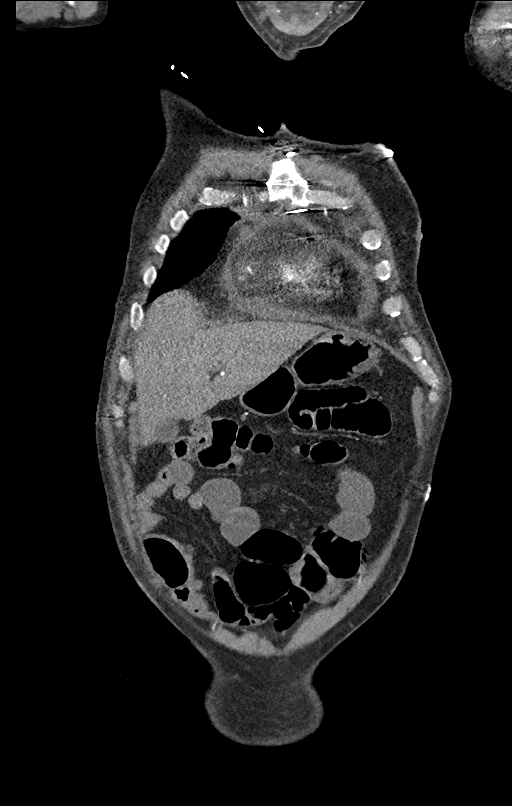
[im 76/151  soft-tissue]
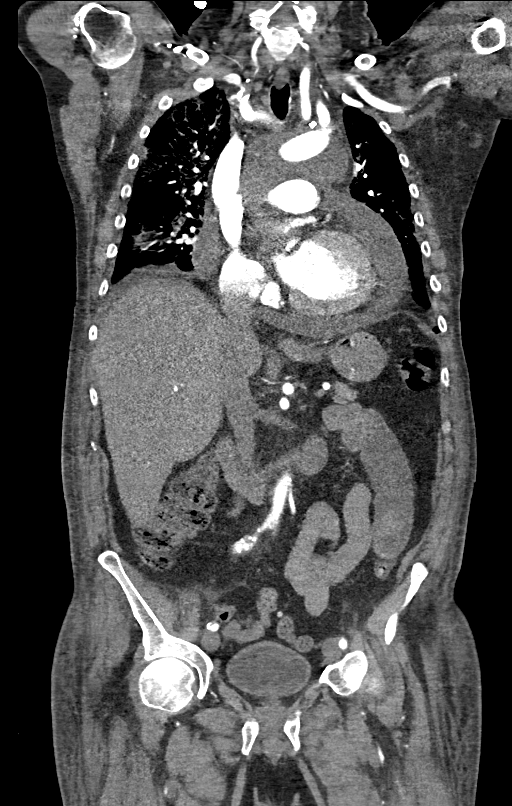
[im 113/151  soft-tissue]
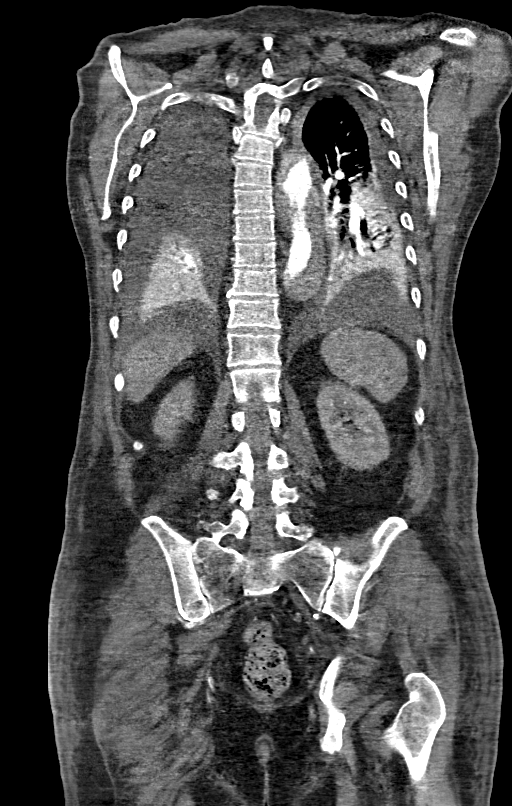

[12 of 46 positions shown; findings below may reference images not displayed]

FINDINGS: CTA CHEST FINDINGS

Cardiovascular: Aortic valve and root repair changes are noted.
There has been re- implantation of the great vessels onto the
prosthetic material. Postoperative changes in the aortic arch are
also noted from prostatic repair. The native true lumen begins in
the proximal descending thoracic aorta. There is significant
low-density material surrounding the ascending aorta and aortic
arch. This is similar in density to the pericardial fluid.
Hounsfield unit measurements are in the low 20s and this likely
represents thrombus. The pericardial effusion is moderate to large.
Beginning in the descending thoracic aorta, the false lumen does
opacified. The true lumen is relatively patent compared to the prior
study. The ascending aorta, aortic arch, and true lumen of the
descending thoracic aorta are patent. The implanted great vessels
are patent. There is no longer any evidence of dissection in the
great vessels. Pulmonary artery is also patent. There is no obvious
pulmonary thromboembolism.

Mediastinum/Nodes: There are no obviously enlarged lymph nodes.
Scattered small nodes related to inflammatory change and
postoperative status are noted. The aforementioned moderate to large
pericardial effusion is noted.

Lungs/Pleura: There are extensive airspace opacities throughout both
lungs. There is more confluent consolidation in the dependent
bilateral lower lobes. There are small bilateral associated pleural
effusions. No pneumothorax.

Musculoskeletal: Sternal wires are in place. There is no vertebral
compression deformity.

Review of the MIP images confirms the above findings.

CTA ABDOMEN AND PELVIS FINDINGS

VASCULAR

Aorta: The dissection continues into the abdominal aorta and extends
into the left common iliac artery. The true lumen remains opacified.
There is faint opacification of the false lumen.

Celiac: Origin from the true lumen. There is narrowing at the origin
likely due tube median arcuate ligament syndrome. No evidence of
dissection.

SMA: Origin is from the true lumen. It is patent without evidence of
dissection.

Renals: There are 2 right renal arteries. There are also 2 left
renal arteries. All originates from the true lumen and are patent.

IMA: Originates from the true lumen. There is significant narrowing
at the origin.

Inflow: Atherosclerotic plaque and calcifications in the right
common iliac artery are noted without significant focal narrowing.
The external iliac artery is moderately diseased. The right internal
iliac artery is moderately diseased. There is significant narrowing
at the origin.

The dissection extends into the upper left common iliac artery and
is not flow limiting. Beyond the dissection, the vessel is widely
patent with diffuse atherosclerotic changes. There are mild
atherosclerotic changes of the left internal and external iliac
arteries.

Review of the MIP images confirms the above findings.

NON-VASCULAR

Hepatobiliary: Unremarkable liver cholelithiasis.

Pancreas: Unremarkable

Spleen: Unremarkable

Adrenals/Urinary Tract: Kidneys are within normal limits. Adrenal
glands are within normal limits. Bladder is unremarkable.

Stomach/Bowel: Stomach and duodenum are unremarkable. No obvious
mass in the colon. Mildly dilated anterior small bowel loops with
air-fluid levels suggesting mild ileus pattern. No free
intraperitoneal gas.

Lymphatic: No abnormal retroperitoneal adenopathy. There is
stranding in the retroperitoneal fat in the posterior pelvis and
about the sigmoid colon.

Reproductive: Prostate is mildly enlarged.

Other: No free-fluid in the abdomen.

Musculoskeletal: No vertebral compression deformity.

Review of the MIP images confirms the above findings.
IMPRESSION: Vascular:

The patient is status post repair with graft placement in the
ascending aorta, aortic arch, and aortic valve replacement. The
graft and great vessels are widely patent.

The graft attach is to the true lumen of the proximal descending
thoracic aorta. The true lumen is relatively patent compared to the
prior study. The false lumen continues to opacified. This continues
into the abdomen and to the left common iliac artery.

Large amount of high air density fluid around the ascending aorta
and in the pericardium likely related to the recent operative
procedure. This may represent proteinaceous fluid or evolving blood
products.

SMA and renal arteries are patent.

Celiac axis narrowing is secondary to median arcuate ligament
syndrome

Narrowing at the origin of the IMA is due to atherosclerosis.

No significant narrowing in the common or external iliac arteries.
The extension does extend into the upper left common iliac artery.

Nonvascular:

Bilateral airspace disease.

Bibasilar consolidation.

Bilateral pleural effusions.

Cholelithiasis.

## 2018-06-13 IMAGING — DX DG CHEST 2V
2 series · 2 of 2 positions shown · non-contrast
Comparison: 03/31/2017

CLINICAL DATA: Shortness of breath, CHF

EXAM:
CHEST  2 VIEW

[x chest ap]
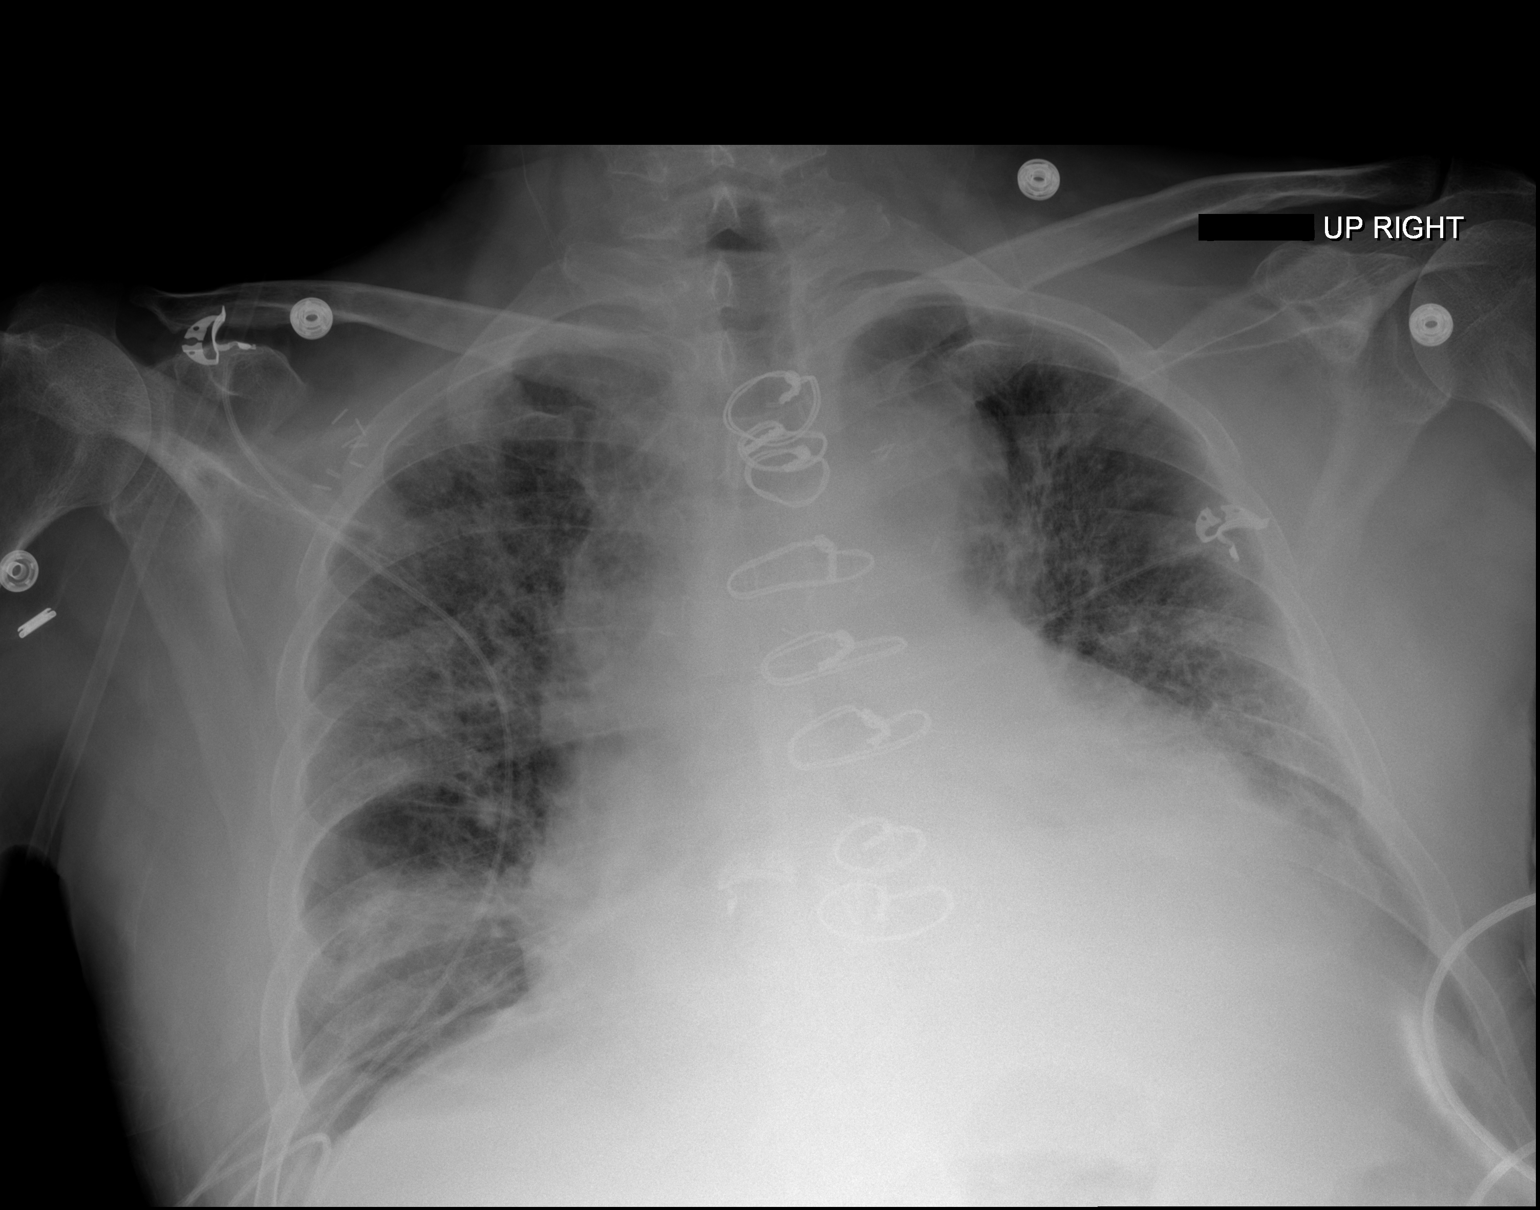

[w chest lat]
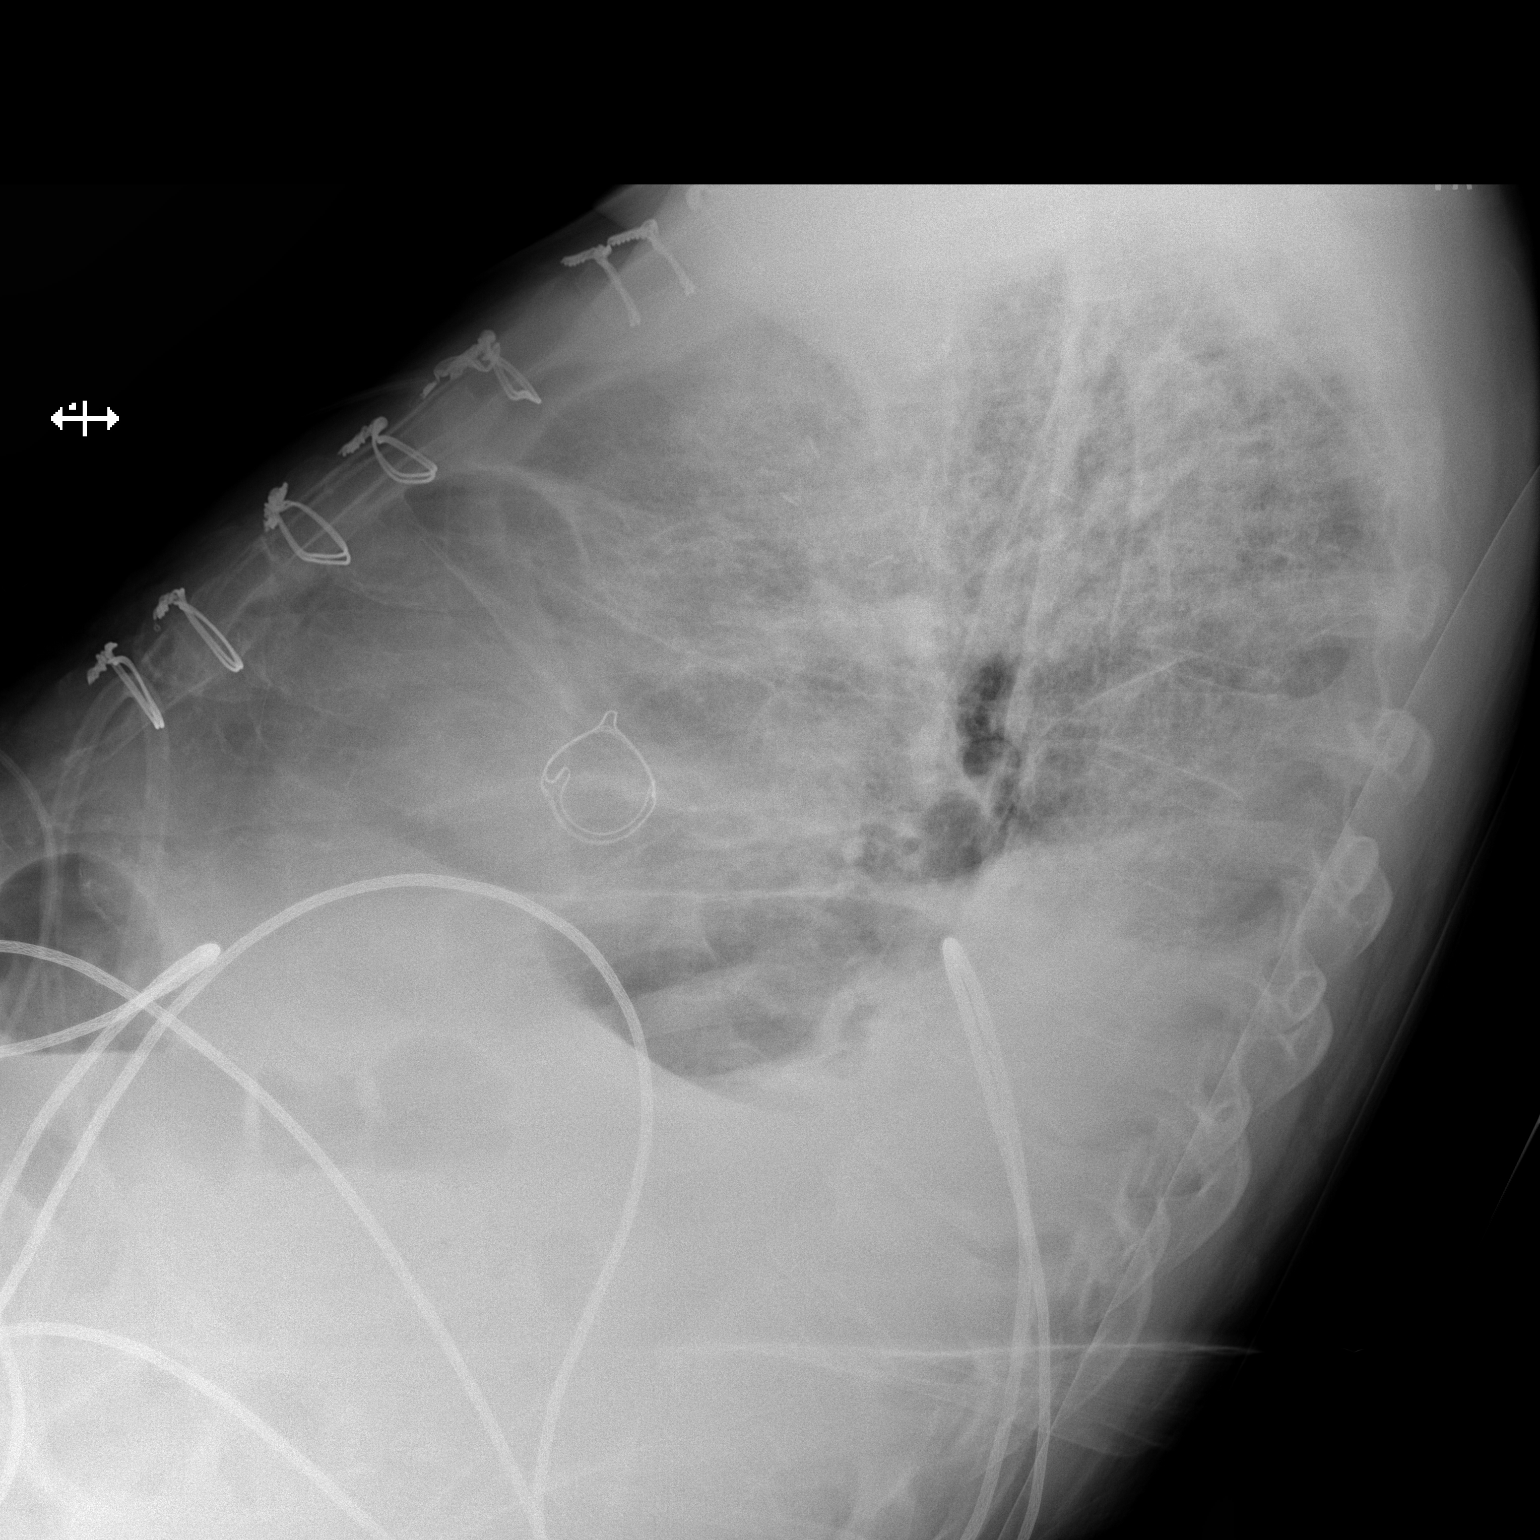

[2 of 2 positions shown; findings below may reference images not displayed]

FINDINGS: Prior median sternotomy. Cardiomegaly. Bilateral airspace opacities
are noted with layering bilateral effusions. Airspace disease
increased since prior study, likely worsening CHF.
IMPRESSION: Worsening bilateral airspace disease, likely CHF.

Layering bilateral effusions.

## 2018-06-13 NOTE — Telephone Encounter (Signed)
Sent!

## 2018-06-13 NOTE — Telephone Encounter (Signed)
Refill request for alprazolam.   Last OV: 04/18/2018 Last Fill: 10/27/2017 #40 and 3RF UDS: 04/18/2018 Low risk  NCCR currently down

## 2018-06-19 IMAGING — DX DG CHEST 1V PORT
1 series · 1 of 1 positions shown · non-contrast
Comparison: 04/01/2017

CLINICAL DATA: Post pericardial drain.  Shortness of breath.

EXAM:
PORTABLE CHEST 1 VIEW

[chest]
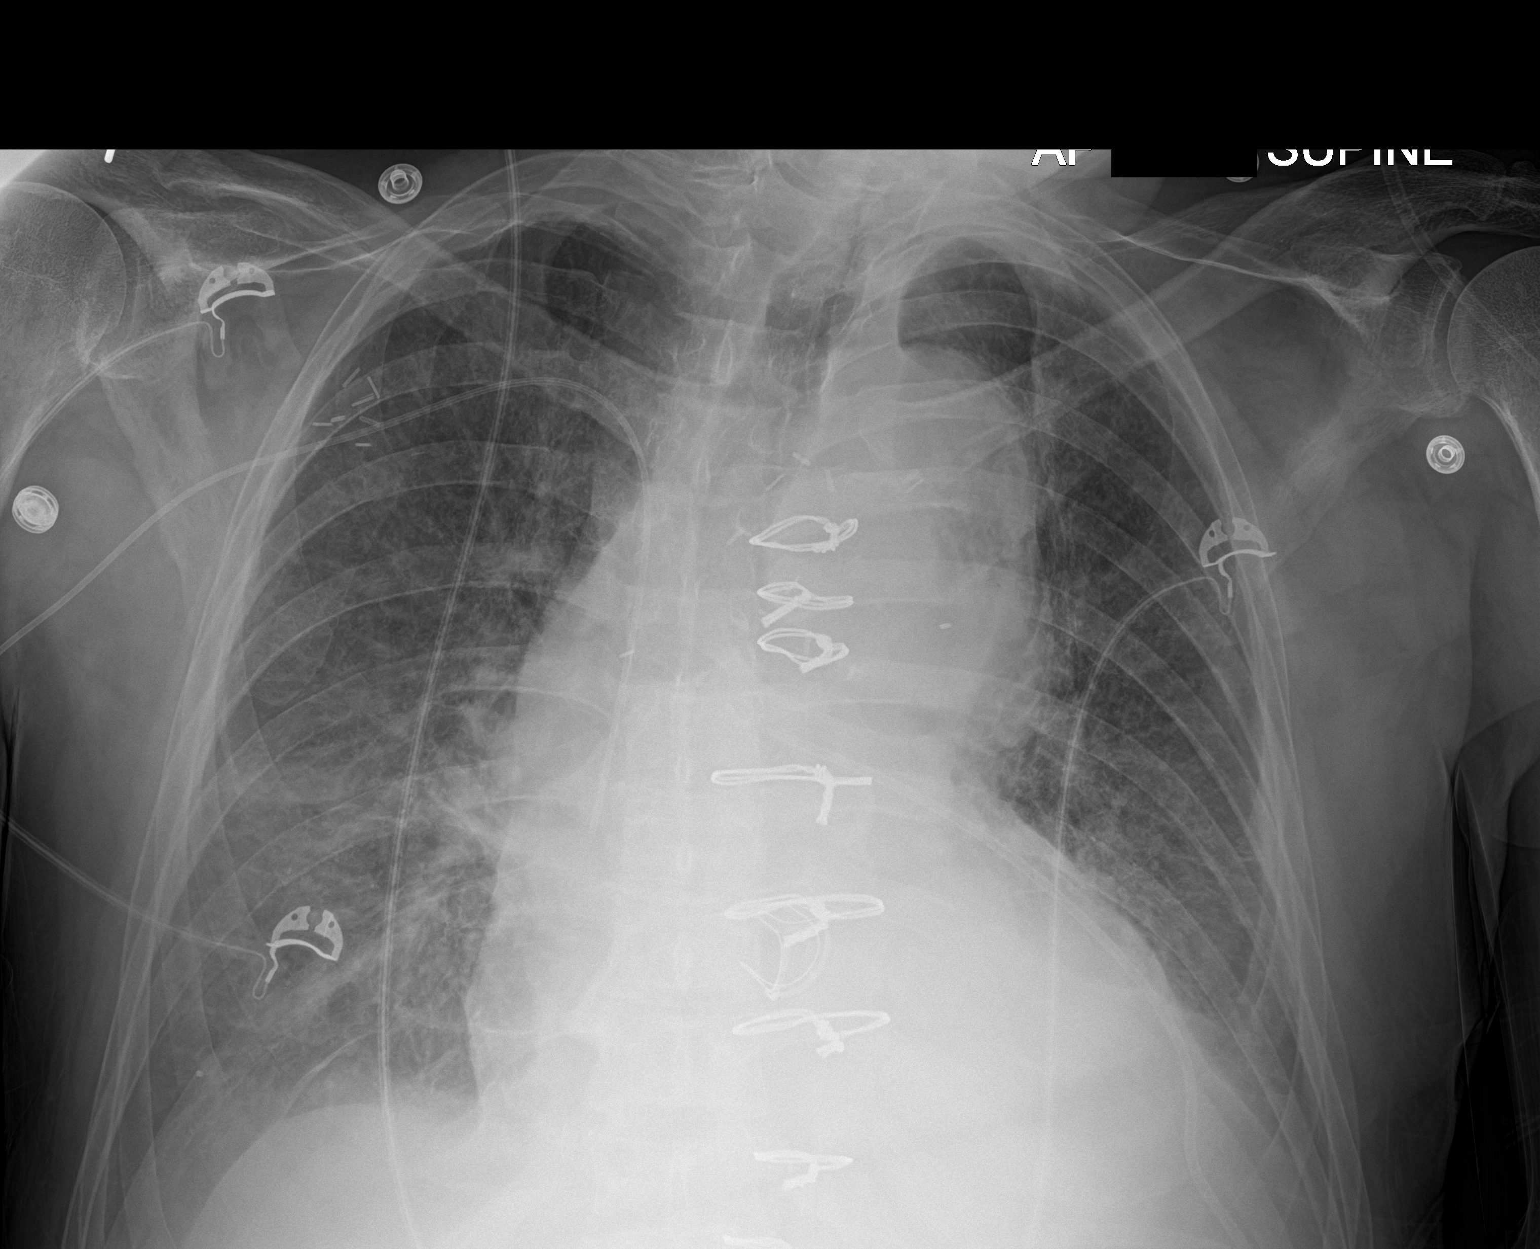

[1 of 1 positions shown; findings below may reference images not displayed]

FINDINGS: Right-sided PICC line terminates at the low SVC. Prior median
sternotomy. Aortic valve repair. Midline trachea. Patient rotated
minimally right. Moderate cardiomegaly. Probable pericardial drain,
new. Decreased size of the cardiopericardial silhouette. No pleural
effusion or pneumothorax. Moderate interstitial edema. Bibasilar
airspace disease.
IMPRESSION: Cardiomegaly with improvement in mild interstitial edema.

Persistent bibasilar Airspace disease, likely atelectasis.

## 2018-06-20 ENCOUNTER — Encounter: Payer: Self-pay | Admitting: Pulmonary Disease

## 2018-06-20 ENCOUNTER — Ambulatory Visit (INDEPENDENT_AMBULATORY_CARE_PROVIDER_SITE_OTHER): Payer: Medicare Other | Admitting: Pulmonary Disease

## 2018-06-20 VITALS — BP 130/74 | HR 56 | Ht 72.0 in | Wt 193.0 lb

## 2018-06-20 DIAGNOSIS — J439 Emphysema, unspecified: Secondary | ICD-10-CM

## 2018-06-20 DIAGNOSIS — I6529 Occlusion and stenosis of unspecified carotid artery: Secondary | ICD-10-CM

## 2018-06-20 MED ORDER — ALBUTEROL SULFATE HFA 108 (90 BASE) MCG/ACT IN AERS: INHALATION_SPRAY | RESPIRATORY_TRACT | 1 refills | Status: DC

## 2018-06-20 MED ORDER — ALBUTEROL SULFATE HFA 108 (90 BASE) MCG/ACT IN AERS: INHALATION_SPRAY | RESPIRATORY_TRACT | 3 refills | Status: DC

## 2018-06-20 NOTE — Patient Instructions (Signed)
We will renew your inhalers for 3 months at a time Follow-up in 6 months.

## 2018-06-20 NOTE — Progress Notes (Signed)
Sean Riley    [id number]    [birth year]  Primary Care Physician:Paz, Alda Berthold, MD  Referring Physician: Colon Branch, MD [address] [place], [place] [address]  Chief complaint: Follow-up for COPD  HPI: 67 year old heavy smoker with history of ascending aortic dissection status post repair, aortic wall replacement on 03/24/17. Followed with Dr. Roxy Manns.  CT scan in January showed large bilateral effusion.  He has been placed on Lasix with improvement in effusion States that his breathing continues to be poor post surgery with no improvement with diuresis Had a COPD exacerbation earlier this year, was exposed to the flu from his son treated with Zithromax and Tamiflu  He has followed up with Dr. Ricard Dillon and underwent thoracentesis of the right pleural effusion on 4/18.  I do not see any pleural studies sent from the procedure.  He is also being evaluated by Dr. Trula Slade for descending aortic aneurysm and possible endovascular repair.  Pets: Dog, no birds, [place]. Occupation: Retired Museum/gallery curator. Exposures: No known exposures, no mold, he has a hot tub but does not use it Smoking history: 90-pack-year smoking history.  Quit in 2018 Travel History: Not significant  Interim history: Underwent endovascular repair of thoracic aortic aneurysm on 03/30/2018 without any respiratory issue Continues on Breo and incruse. Has chronic dyspnea on exertion, minimal cough with sputum production.  Outpatient Encounter Medications as of 06/20/2018  Medication Sig  . albuterol (PROAIR HFA) 108 (90 Base) MCG/ACT inhaler Inhale two puffs every four to six hours as needed for cough or wheeze.  Marland Kitchen albuterol (PROVENTIL) (2.5 MG/3ML) 0.083% nebulizer solution Take 3 mLs (2.5 mg total) by nebulization every 8 (eight) hours as needed for wheezing or shortness of breath.  . ALPRAZolam (XANAX) 1 MG tablet Take 1 tablet (1 mg total) by mouth at bedtime.  . ALPRAZolam  (XANAX) 1 MG tablet TAKE 1 TO 1 AND 1/2 TABLETS BY MOUTH AT BEDTIME.  Marland Kitchen apixaban (ELIQUIS) 5 MG TABS tablet Take 1 tablet (5 mg total) by mouth 2 (two) times daily.  Marland Kitchen aspirin EC 81 MG tablet Take 1 tablet (81 mg total) by mouth daily.  Marland Kitchen augmented betamethasone dipropionate (DIPROLENE-AF) 0.05 % cream Apply 1 application topically 2 (two) times daily as needed (itchy skin.).  Marland Kitchen azelastine (ASTELIN) 0.1 % nasal spray Place 2 sprays into both nostrils 2 (two) times daily as needed for allergies.  . diphenhydrAMINE (BENADRYL) 25 MG tablet Take 25 mg by mouth every 8 (eight) hours as needed (allergies/runny nose).   Marland Kitchen escitalopram (LEXAPRO) 10 MG tablet TAKE 1 TABLET BY MOUTH DAILY.  Marland Kitchen esomeprazole (NEXIUM) 40 MG capsule Take 1 capsule (40 mg total) by mouth daily before breakfast.  . flecainide (TAMBOCOR) 100 MG tablet Take 1 tablet (100 mg total) by mouth 2 (two) times daily.  . fluticasone (FLONASE) 50 MCG/ACT nasal spray Place 1 spray into both nostrils 2 (two) times daily as needed for allergies. Use one spray in each nostril twice daily.  . fluticasone furoate-vilanterol (BREO ELLIPTA) 200-25 MCG/INH AEPB Inhale 1 puff into the lungs at bedtime.  . furosemide (LASIX) 40 MG tablet Take 1 tablet (40 mg total) by mouth 2 (two) times daily.  . metoprolol tartrate (LOPRESSOR) 25 MG tablet Take 1 tablet (25 mg total) by mouth 2 (two) times daily.  . potassium chloride (K-DUR) 10 MEQ tablet Take 1 tablet (10 mEq total) by mouth daily.  Marland Kitchen  umeclidinium bromide (INCRUSE ELLIPTA) 62.5 MCG/INH AEPB Inhale 1 puff into the lungs at bedtime.   . [DISCONTINUED] albuterol (PROAIR HFA) 108 (90 Base) MCG/ACT inhaler Inhale two puffs every four to six hours as needed for cough or wheeze.   No facility-administered encounter medications on file as of 06/20/2018.    Physical Exam: Blood pressure 130/74, pulse (!) 56, height 6' (1.829 m), weight 193 lb (87.5 kg), SpO2 94 %. Gen:      No acute distress HEENT:   EOMI, sclera anicteric Neck:     No masses; no thyromegaly Lungs:    Clear to auscultation bilaterally; normal respiratory effort CV:         Irregular, loud prosthetic equal click Abd:      + bowel sounds; soft, non-tender; no palpable masses, no distension Ext:    No edema; adequate peripheral perfusion Skin:      Warm and dry; no rash Neuro: alert and oriented x 3 Psych: normal mood and affect  Data Reviewed: PFTs 01/05/17-FVC 4.02 [80%], FEV1 3.03 [81%], F/F 75 12/24/15-FVC 4.27 [91%], FEV1 2.39 [86%], F/F 79 Normal spirometry  12/25/13 FVC 4.96 [103%], FEV1 3.51 [97%], F/F 71, p.o. 314%, DLCO 72% Minimal obstruction, small airway disease, minimal diffusion defect  01/25/2018 FVC 2.92 (62%], FEV1 1.98 [57%], F/F 68, TLC 66%, DLCO 32% Moderate-severe obstruction, severe restriction and diffusion defect.  Imaging CT 03/22/17- moderate emphysema, left upper lobe 3.6 mm nodule. CT angio 08/23/17-emphysema, large-moderate right and left effusion. Chest x-ray 09/20/17- improved size of pleural effusion.  Minimal basilar atelectasis, hyperinflation. I have reviewed the images personally.  Labs Blood allergy profile 01/25/2018- IgE 841, sensitive to grass, cat, dog, tree pollen CBC 01/25/2018- WBC 6.7, eos 4.8%, absolute eosinophil count 300 Alpha-1 antitrypsin 01/25/2018- 166, PI MM  Assessment:  COPD PFTs reviewed with moderate-severe obstruction Continue on Breo and incruse O2 levels are stable on exertion  Pleural effusions Likely secondary to heart failure.   Lasix diuresis per cardiology.  Lasix dose increased on 10/30 by Dr. Caryl Comes. Continue monitoring.  Health maintenance Pneumovax 10/16/13, Prevnar 05/10/14 Flu vaccine 05/18/18  Plan/Recommendations: - Continue Breo, Incruse Follow-up in 6 months.  [name] MD [facility] 06/20/2018, 11:18 AM  CC: Colon Branch, MD

## 2018-06-23 ENCOUNTER — Other Ambulatory Visit: Payer: Medicare Other | Admitting: *Deleted

## 2018-06-23 DIAGNOSIS — Z79899 Other long term (current) drug therapy: Secondary | ICD-10-CM

## 2018-06-23 DIAGNOSIS — I480 Paroxysmal atrial fibrillation: Secondary | ICD-10-CM | POA: Diagnosis not present

## 2018-06-23 DIAGNOSIS — I7103 Dissection of thoracoabdominal aorta: Secondary | ICD-10-CM | POA: Diagnosis not present

## 2018-06-23 DIAGNOSIS — I5030 Unspecified diastolic (congestive) heart failure: Secondary | ICD-10-CM | POA: Diagnosis not present

## 2018-06-23 DIAGNOSIS — I10 Essential (primary) hypertension: Secondary | ICD-10-CM | POA: Diagnosis not present

## 2018-06-24 ENCOUNTER — Telehealth: Payer: Self-pay | Admitting: Internal Medicine

## 2018-06-24 LAB — BASIC METABOLIC PANEL
BUN/Creatinine Ratio: 10 (ref 10–24)
BUN: 10 mg/dL (ref 8–27)
CO2: 24 mmol/L (ref 20–29)
Calcium: 9.1 mg/dL (ref 8.6–10.2)
Chloride: 92 mmol/L — ABNORMAL LOW (ref 96–106)
Creatinine, Ser: 1.02 mg/dL (ref 0.76–1.27)
GFR calc Af Amer: 87 mL/min/{1.73_m2} (ref >59)
GFR calc non Af Amer: 75 mL/min/{1.73_m2} (ref >59)
Glucose: 83 mg/dL (ref 65–99)
Potassium: 4.5 mmol/L (ref 3.5–5.2)
Sodium: 132 mmol/L — ABNORMAL LOW (ref 134–144)

## 2018-06-24 LAB — PRO B NATRIURETIC PEPTIDE: NT-Pro BNP: 984 pg/mL — ABNORMAL HIGH (ref 0–376)

## 2018-06-24 NOTE — Telephone Encounter (Signed)
Pt's wife calling concerned with bruising and some slight nose bleeding when pt blows his nose. I advised pt's wife that some bruising is normal and with the cold and dry weather, there is an increased chance for pt's nose to bleed if he blows too hard. I advised her to have him use some petroleum jelly in his nose to keep it moist and to be careful when blowing his nose.   She has verbalized understanding and had no additional questions.

## 2018-06-24 NOTE — Telephone Encounter (Signed)
° °  Pt c/o medication issue:  1. Name of Medication: HENIDPOE  2. How are you currently taking this medication (dosage and times per day)? Y  3. Are you having a reaction (difficulty breathing--STAT)? Sever Bruising     4. What is your medication issue? Bleeding and brusing

## 2018-06-30 ENCOUNTER — Telehealth: Payer: Self-pay

## 2018-06-30 DIAGNOSIS — R9431 Abnormal electrocardiogram [ECG] [EKG]: Secondary | ICD-10-CM

## 2018-06-30 NOTE — Telephone Encounter (Signed)
-----   Message from Howie Ill sent at 06/30/2018 10:41 AM EST ----- Regarding: genetic counseling Ms Buckle stopped in said she was called to set up appt for a genetic counselling appt with Dr Broadus John, I set up the appt, but I need a referral attached to it  Thanks  Romie Minus

## 2018-07-15 ENCOUNTER — Ambulatory Visit: Payer: Medicare Other | Admitting: Internal Medicine

## 2018-07-21 ENCOUNTER — Ambulatory Visit: Payer: Medicare Other | Admitting: Genetic Counselor

## 2018-07-27 ENCOUNTER — Ambulatory Visit (INDEPENDENT_AMBULATORY_CARE_PROVIDER_SITE_OTHER): Payer: Medicare Other | Admitting: Internal Medicine

## 2018-07-27 ENCOUNTER — Encounter: Payer: Self-pay | Admitting: Internal Medicine

## 2018-07-27 VITALS — BP 128/74 | HR 59 | Ht 72.0 in | Wt 198.4 lb

## 2018-07-27 DIAGNOSIS — I4510 Unspecified right bundle-branch block: Secondary | ICD-10-CM | POA: Diagnosis not present

## 2018-07-27 DIAGNOSIS — I471 Supraventricular tachycardia: Secondary | ICD-10-CM

## 2018-07-27 DIAGNOSIS — I480 Paroxysmal atrial fibrillation: Secondary | ICD-10-CM | POA: Diagnosis not present

## 2018-07-27 DIAGNOSIS — I6529 Occlusion and stenosis of unspecified carotid artery: Secondary | ICD-10-CM

## 2018-07-27 DIAGNOSIS — I10 Essential (primary) hypertension: Secondary | ICD-10-CM

## 2018-07-27 DIAGNOSIS — I7103 Dissection of thoracoabdominal aorta: Secondary | ICD-10-CM

## 2018-07-27 MED ORDER — FLECAINIDE ACETATE 100 MG PO TABS: 100.0000 mg | ORAL_TABLET | Freq: Two times a day (BID) | ORAL | 3 refills | Status: DC

## 2018-07-27 MED ORDER — APIXABAN 5 MG PO TABS: 5.0000 mg | ORAL_TABLET | Freq: Two times a day (BID) | ORAL | 3 refills | Status: DC

## 2018-07-27 NOTE — Progress Notes (Signed)
Patient Care Team: Colon Branch, MD as PCP - General (Internal Medicine) Deboraha Sprang, MD as Consulting Physician (Cardiology) Darleen Crocker, MD as Consulting Physician (Ophthalmology) Loletha Carrow Kirke Corin, MD as Consulting Physician (Gastroenterology) Neldon Mc Donnamarie Poag, MD as Consulting Physician (Allergy and Immunology) Rexene Alberts, MD as Consulting Physician (Cardiothoracic Surgery)   HPI  Sean Riley is a 67 y.o. male Seen With Chief Complaint of  atrial fibrillation. He continues to take flecainide; he's been largely asymptomatic without symptomatic recurrences.     Pulmonary function testing 5/15 demonstrated mild emphysema..  There has been no syncope or presyncope.     Hospitalized 8/18 for aortic dissection w root replacement, prosthetic AVR and reconstruction of entire ascending aorta cx by pericardial effusion requiring pericardiocentesis and some R hand injury.  Subsequently developed worsening ascending aortic dilatation and underwent endovascular repair 8/19+  DATE TEST EF   2013/  MYOVIEW   58 % Abnormal  2013 [facility]    % Normal CAs  12/16 [facility]OVIEW 48%   9/18 Echo  50-55% Bioprosthetic AVR     Date PR QRS Flecainide  5/12  112   5/15  116 100  5/16  132 100  6/17  132 100  12/17  136 100  9/18 166 144 100  1/19 180 146 100  5/19 176 156 100  9/19`  170 100  10/19 174 162 100  12/19 172 156 100   When seen 9/19 he was found to have atrial tachycardia.  Efforts to pace terminate failed when he went into atrial fibrillation which we thought would revert to sinus spontaneously.  Unfortunately he reverted to atrial tachycardia.  He was seen by DC-NP started on anticoagulation and submitted for cardioversion 10/22-successful  10/19 seen with chronic CHF with vol overload; increased diuretic   Responded to increased diuretics.  Reviewing his diet, however, it is excessively burdened with sodium.  His fluid intake is also quite liberal.  Denies  chest pain.  Exfoliation of his ankles is a healing following resolution of some of his edema  Thromboembolic risk factors ( age -60, HTN-1, Vasc disease -1) for a CHADSVASc Score of 3    Past Medical History:  Diagnosis Date  . Allergy   . Arthritis   . Ascending aortic dissection ([place]) 03/23/2017  . Atrial fibrillation ([place])   . Cataract    removed both eyes   . GERD (gastroesophageal reflux disease)   . HYPERLIPIDEMIA   . 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 Hypertension   . Microscopic hematuria    negative cystoscopy  . NONSPECIFIC ABNORMAL ELECTROCARDIOGRAM   . Pleural effusion on right   . Pleural effusion, bilateral   . S/P aortic dissection repair 03/24/2017   Biological Bentall aortic root replacement + resection and grafting of entire ascending aorta, transverse aortic arch and proximal descending thoracic aorta with elephant trunk distal anastomosis and debranching of aortic arch vessels  . 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 Varicose veins     Past Surgical History:  Procedure Laterality Date  . APPENDECTOMY    . CARDIAC CATHETERIZATION      X3;Dr Caryl Comes, last 01/20/12: mild non-obstructive CAD, normal LV systolic function  . CARDIOVERSION N/A 05/31/2018  Procedure: CARDIOVERSION;  Surgeon: Sueanne Margarita, MD;  Location: [facility] ENDOSCOPY;  Service: Cardiovascular;  Laterality: N/A;  . COLONOSCOPY    . CYSTOSCOPY  1978   Dr Hartley Barefoot  . EYE SURGERY    . IR THORACENTESIS ASP PLEURAL SPACE W/IMG GUIDE  11/25/2017  . KNEE ARTHROSCOPY  2012   Dr Theda Sers  . LUMBAR LAMINECTOMY  1987   Dr Durward Fortes  . MYELOGRAM  2007  . PERICARDIOCENTESIS N/A 04/07/2017   Procedure: PERICARDIOCENTESIS;  Surgeon: Sherren Mocha, MD;  Location: [facility] LAB;  Service:  Cardiovascular;  Laterality: N/A;  . POLYPECTOMY    . REPAIR OF ACUTE ASCENDING THORACIC AORTIC DISSECTION N/A 03/23/2017   Procedure: REPAIR OF ACUTE ASCENDING THORACIC AORTIC DISSECTION.  Bentall procedure.  Aortic root repleacement with bioprosthetic valve.  Reimplantation of left and right coronary arteries.  Total resection of transverse aortic arch.  Elephant trunk distal anastomosis and debranching of arch vessels.;  Surgeon: Rexene Alberts, MD;  Location: [facility];  Service: Vascular;  Laterality: N/A;  . ROTATOR CUFF REPAIR    . SHOULDER ARTHROSCOPY  2011   Dr Theda Sers  . SHOULDER ARTHROSCOPY Right 08/2015  . SPINAL FUSION  1986   Dr Rolin Barry  . TEE WITHOUT CARDIOVERSION N/A 03/23/2017   Procedure: TRANSESOPHAGEAL ECHOCARDIOGRAM (TEE);  Surgeon: Rexene Alberts, MD;  Location: [facility];  Service: Open Heart Surgery;  Laterality: N/A;  . THORACIC AORTIC ENDOVASCULAR STENT GRAFT N/A 03/30/2018   Procedure: THORACIC AORTIC ENDOVASCULAR STENT GRAFT WITH INTRAVASCULAR ULTRASOUND;  Surgeon: Serafina Mitchell, MD;  Location: [facility] OR;  Service: Vascular;  Laterality: N/A;  . TRACHEOSTOMY     age 25 for croup    Current Outpatient Medications  Medication Sig Dispense Refill  . albuterol (PROAIR HFA) 108 (90 Base) MCG/ACT inhaler Inhale two puffs every four to six hours as needed for cough or wheeze. 3 Inhaler 3  . albuterol (PROVENTIL) (2.5 MG/3ML) 0.083% nebulizer solution Take 3 mLs (2.5 mg total) by nebulization every 8 (eight) hours as needed for wheezing or shortness of breath. 150 mL 1  . ALPRAZolam (XANAX) 1 MG tablet Take 1 tablet (1 mg total) by mouth at bedtime.    Marland Kitchen apixaban (ELIQUIS) 5 MG TABS tablet Take 1 tablet (5 mg total) by mouth 2 (two) times daily. 60 tablet 3  . aspirin EC 81 MG tablet Take 1 tablet (81 mg total) by mouth daily.    Marland Kitchen augmented betamethasone dipropionate (DIPROLENE-AF) 0.05 % cream Apply 1 application topically 2 (two) times daily as needed (itchy skin.).    Marland Kitchen  azelastine (ASTELIN) 0.1 % nasal spray Place 2 sprays into both nostrils 2 (two) times daily as needed for allergies.    . diphenhydrAMINE (BENADRYL) 25 MG tablet Take 25 mg by mouth every 8 (eight) hours as needed (allergies/runny nose).     Marland Kitchen escitalopram (LEXAPRO) 10 MG tablet TAKE 1 TABLET BY MOUTH DAILY. 30 tablet 5  . esomeprazole (NEXIUM) 40 MG capsule Take 1 capsule (40 mg total) by mouth daily before breakfast. 90 capsule 3  . flecainide (TAMBOCOR) 100 MG tablet Take 1 tablet (100 mg total) by mouth 2 (two) times daily. 180 tablet 3  . fluticasone (FLONASE) 50 MCG/ACT nasal spray Place 1 spray into both nostrils 2 (two) times daily as needed for allergies. Use one spray in each nostril twice daily.    . fluticasone furoate-vilanterol (BREO ELLIPTA) 200-25 MCG/INH AEPB Inhale 1 puff into the lungs at bedtime.    Marland Kitchen  furosemide (LASIX) 40 MG tablet Take 1 tablet (40 mg total) by mouth 2 (two) times daily. 90 tablet 3  . metoprolol tartrate (LOPRESSOR) 25 MG tablet Take 1 tablet (25 mg total) by mouth 2 (two) times daily. 180 tablet 3  . potassium chloride (K-DUR) 10 MEQ tablet Take 1 tablet (10 mEq total) by mouth daily. 30 tablet 5  . umeclidinium bromide (INCRUSE ELLIPTA) 62.5 MCG/INH AEPB Inhale 1 puff into the lungs at bedtime.      No current facility-administered medications for this visit.     Allergies  Allergen Reactions  . Simvastatin Other (See Comments)    Mental status changes  . Celecoxib Other (See Comments)    GI UPSET AND INFLAMMATION  . Codeine Other (See Comments)    GI UPSET AND INFLAMMATION  . Nsaids Other (See Comments)    GI UPSET AND INFLAMMATION (can tolerate via IV)  . Tape Other (See Comments)    Medical tape and Band-Aids PULL OFF THE SKIN; please use Coban wrap  . Cephalexin Itching and Rash  . Latex Rash    Review of Systems negative except from HPI and PMH  Physical Exam BP 128/74   Pulse (!) 59   Ht 6' (1.829 m)   Wt 198 lb 6.4 oz (90 kg)    SpO2 95%   BMI 26.91 kg/m    Well developed and nourished in no acute distress HENT normal Neck supple with JVP-flat Clear Regular rate and rhythm, 6-2/1 systolic murmur  abd-soft with active BS No Clubbing cyanosis left greater than right 2+/1+ edema Skin-warm and dry A & Oriented  Grossly normal sensory and motor function   ECG  Sinus @ 59  Personally reviewed    Assessment and Plan  Atrial tachycardia and fibrillation  Hypertension  HFpEF  Aortic dissection status post reconstruction   Aneurysmal dilatation at the hiatus and in the mid abdomen  Sinus bradycardia   No interval atrial fibrillation of which we are aware  On Anticoagulation;  No bleeding issues   Holding sinus rhythm currently continue flecainide.  Volume overloaded.  Lengthy discussion regarding interaction between sodium intake fluid intake and diuresis.  He is averse to more diuretic.  The issue will be whether he is willing to restrict sodium and/or fluid.  We spent more than 50% of our >25 min visit in face to face counseling regarding the above

## 2018-07-27 NOTE — Patient Instructions (Signed)
Medication Instructions:  Your physician recommends that you continue on your current medications as directed. Please refer to the Current Medication list given to you today.  Labwork: None ordered.  Testing/Procedures: None ordered.  Follow-Up: Your physician recommends that you schedule a follow-up appointment in:    6 months with Chanetta Marshall, NP  12 months with Dr Caryl Comes  Any Other Special Instructions Will Be Listed Below (If Applicable).     If you need a refill on your cardiac medications before your next appointment, please call your pharmacy.

## 2018-08-01 NOTE — Progress Notes (Signed)
Referral Reason Dr. Caryl Comes has referred Sean Riley for genetic testing of aortopathies (AoP).  Pre-Test Genetic Consultation Notes  Sean Riley was counseled on the genetics of syndromes that present with aortic aneurysms and dissections, specifically Marfan syndrome (MFS), Loeys-Dietz syndrome (LDS), Vascular Ehlers-Danlos Syndrome (EDS, IV) and other non-syndromic familial forms of thoracic aortic aneurysms and dissection (FTAAD). We discussed inheritance, incomplete penetrance and variable expression is seen in patients with these conditions. We walked through the process of genetic testing and discussed the potential outcomes of genetic testing and subsequent management of at-risk family members.  We walked through the process of genetic testing. I explained to him that there are three possible outcomes of genetic testing; namely positive, negative and variant of unknown significance. A positive outcome can be expected in patients that do not have risk factors for aortic disease, present early in life with increased severity and have a family history of sudden cardiac death and/or a relative that has been diagnosed with an aortopathy. A negative test does not necessarily exclude a genetic basis for HCM. Limitations in current genetic testing methodology can produce a negative result. Variants of unknown significance (VUS) can be obtained. I explained to him that typically a VUS is so classified if the variant is not well understood as very few individuals have been reported to harbor this variant or its role in gene function has not been elucidated. The potential outcomes of genetic testing and subsequent management of at-risk family members were discussed so as to manage expectations. His medical and 5-generation family history was obtained. See details below-  Sean Riley (III.1 on pedigree) is a 67 year old very pleasant Caucasian gentleman who is here today with his  wife. He informs me that since he was a smoker he has undergoes a chest X-ray every year. His chest X ray of Mar 22, 2017 identified a 4.2cm aortic dilation that dissected the next day while he was on his way to another appointment. He states that he felt fine when he left home for his appointment and seven minutes later his entire body began to hurt. He called 911 and the EMS believed he had a nerve block. He was taken to the hospital where he underwent emergency repair of the aortic dissection with aortic root replacement and resection and grafting of entire ascending aorta, transverse aortic arch and proximal descending thoracic aorta with elephant trunk distal anastomosis and debranching of aortic arch vessels per Dr. Guy Sandifer notes.  He states that as a kid he first noticed her had a racing heart around age 32/12 and tells me that he could "see his shirt moving" when this happened.   He was seen by Dr. Caryl Comes around age 30 at [facility] for tachycardia and states that medication helped reduce the frequency of events.   Currently, he says that he would like to have a recumbent bike so as to improve his leg muscle strength.   Traditional Risk Factors for AoP Sean Riley does report being a smoker for nearly 30 years and have hypertension since age [age]. Smoking and hypertension are considered risk factors that can increase the likelihood of having an aortopathy.  Family history Sean Riley (III.1) has twins, a son and daughter, ages 56. His son (IV.1) reportedly had a "pigeon chest" at 24 and underwent repair for this. He has a heart murmur and rapid heart beat. He had a normal CT scan early this year. His son, Sean Riley (V.31) is now 38 years old. He  too has pectus carinatum, like his father and is in the process of trying to brace his chest. He was also found to have a rapid heart beat at age 16  Sean Riley is the eldest of 4 children. He three younger brothers that include twins (III.2, III.3) are now 71 and 66.  A twin brother (III.2) was found to have tachycardia in his 43s but is otherwise in good health and runs marathons. The other twin brother (III.3) smokes and does not see a doctor! His youngest brother is reportedly tall and has long arms.  There are no major heart issues in Sean Riley' paternal lineage, other than 2 paternal uncles that died of heart issues, the details of which he does not know. However, there is significant history of sudden death in his maternal lineage.  Sean Riley' mother (II.12) died suddenly at age 34. An autopsy was not performed to confirm the cause of his death. He reports that one day his mother complained of severe pressure at the back of her head. She got up to go to the bathroom and collapsed on to the floor. They were unable to revive her. He does say that she had tachycardia around age 38.   Sean Riley reports sudden death in three other maternal relatives (I.4, II.13, II.17). His maternal aunt (II.13) had an aortic aneurysm at 28. This aneurysm ruptured during the repair procedure, which she survived. She was found to have another aneurysm at 76, likely in the abdominal aorta that was reportedly too dangerous to repair. She was found dead at age 74. Another maternal aunt (II.17) was found dead in her bathroom at age 40. An autopsy was not performed to confirm their cause of death. Sean Riley' maternal grandmother also died suddenly and was found dead by her husband in their bathroom. She was 72. He states that she underwent heart surgery at [facility] in 1965 but is not aware of the details of her surgery.  In addition, Sean Riley has a maternal uncle (II.14) who had a thoracic aortic aneurysm (TAA) in his 44s that was repaired. He had another TAA at 35 and died during surgery to repair it. He also reports another maternal uncle (II.16) who died of heart issues, the exact nature of which he is unaware.   Impression  In summary, Sean Riley presented with an aortic dissection at age 1. He also has a  significant family history of sudden death in his maternal lineage (mother, two aunts and grandmother) in the background of thoracic (uncle, aunt) and abdominal aortic aneurysms (aunt). In addition, he reports pectus carinatum in his son and grandson and, a sibling who is very tall with long arms; presentation that is suspicious of Marfan syndrome. Taken together, it is highly likely that Sean Riley has a genetic condition. Genetic testing for the genes implicated in thoracic aortic aneurysms is recommended.  This test should include the major genes that contribute to MFS, LDS, vEDS and FTAAD. The genetic test will help confirm his diagnosis and identify the genetic basis of his disease. It will also help identify first-degree family members (children, sibling) that may harbor the mutation and are at risk of developing this condition. Appropriate cardiology follow-up and lifestyle management can then be directed to those genotype-positive family members.    In addition, we also discussed the protections afforded by the Genetic Information Non-Discrimination Act (GINA). I explained to him that GINA protects him from losing employment or health insurance based on his genotype. However, these protections do not cover life insurance  and disability. I recommended that appropriate life insurance coverage should be obtained for his children/grandchildren to avoid future denials, if they test positive for the familial variant. He verbalized understanding of this.  I also informed Sean Riley that his insurance provider does not cover genetic testing. I recommended that he discuss this with his children to determine if they will seek appropriate medical care if Sean Riley was found to have a pathogenic variant in a gene linked to aortopathy. He believes his children would seek medical care but would like to have them weigh in on this before he plans to undergo genetic testing.  Plan Sean Riley is interested in genetic testing for  Aortopathies and will reach out to [name] when he is ready to come in for the blood draw.     Sean Riley, Ph.D, [facility] Clinical Molecular Geneticist

## 2018-08-18 ENCOUNTER — Encounter: Payer: Self-pay | Admitting: Internal Medicine

## 2018-08-18 ENCOUNTER — Ambulatory Visit (INDEPENDENT_AMBULATORY_CARE_PROVIDER_SITE_OTHER): Payer: Medicare Other | Admitting: Internal Medicine

## 2018-08-18 VITALS — BP 138/72 | HR 72 | Temp 98.2°F | Resp 16 | Ht 72.0 in | Wt 204.1 lb

## 2018-08-18 DIAGNOSIS — D649 Anemia, unspecified: Secondary | ICD-10-CM | POA: Diagnosis not present

## 2018-08-18 DIAGNOSIS — I480 Paroxysmal atrial fibrillation: Secondary | ICD-10-CM

## 2018-08-18 DIAGNOSIS — J441 Chronic obstructive pulmonary disease with (acute) exacerbation: Secondary | ICD-10-CM

## 2018-08-18 DIAGNOSIS — E785 Hyperlipidemia, unspecified: Secondary | ICD-10-CM | POA: Diagnosis not present

## 2018-08-18 DIAGNOSIS — I10 Essential (primary) hypertension: Secondary | ICD-10-CM | POA: Diagnosis not present

## 2018-08-18 LAB — LIPID PANEL
CHOL/HDL RATIO: 4
Cholesterol: 163 mg/dL (ref 0–200)
HDL: 39.1 mg/dL (ref >39.00)
LDL CALC: 100 mg/dL — AB (ref 0–99)
NONHDL: 123.41
Triglycerides: 119 mg/dL (ref 0.0–149.0)
VLDL: 23.8 mg/dL (ref 0.0–40.0)

## 2018-08-18 LAB — BASIC METABOLIC PANEL
BUN: 9 mg/dL (ref 6–23)
CHLORIDE: 94 meq/L — AB (ref 96–112)
CO2: 29 meq/L (ref 19–32)
CREATININE: 1.03 mg/dL (ref 0.40–1.50)
Calcium: 9.1 mg/dL (ref 8.4–10.5)
GFR: 76.16 mL/min (ref >60.00)
GLUCOSE: 78 mg/dL (ref 70–99)
Potassium: 4.4 mEq/L (ref 3.5–5.1)
Sodium: 130 mEq/L — ABNORMAL LOW (ref 135–145)

## 2018-08-18 LAB — IRON: Iron: 20 ug/dL — ABNORMAL LOW (ref 42–165)

## 2018-08-18 LAB — CBC WITH DIFFERENTIAL/PLATELET
BASOS ABS: 0 10*3/uL (ref 0.0–0.1)
Basophils Relative: 0.8 % (ref 0.0–3.0)
EOS ABS: 0.2 10*3/uL (ref 0.0–0.7)
Eosinophils Relative: 4.3 % (ref 0.0–5.0)
HCT: 34.1 % — ABNORMAL LOW (ref 39.0–52.0)
Hemoglobin: 10.9 g/dL — ABNORMAL LOW (ref 13.0–17.0)
LYMPHS PCT: 15.4 % (ref 12.0–46.0)
Lymphs Abs: 0.8 10*3/uL (ref 0.7–4.0)
MCHC: 32.1 g/dL (ref 30.0–36.0)
MCV: 77.6 fl — ABNORMAL LOW (ref 78.0–100.0)
Monocytes Absolute: 0.7 10*3/uL (ref 0.1–1.0)
Monocytes Relative: 13.1 % — ABNORMAL HIGH (ref 3.0–12.0)
NEUTROS PCT: 66.4 % (ref 43.0–77.0)
Neutro Abs: 3.4 10*3/uL (ref 1.4–7.7)
PLATELETS: 310 10*3/uL (ref 150.0–400.0)
RBC: 4.39 Mil/uL (ref 4.22–5.81)
RDW: 18.7 % — ABNORMAL HIGH (ref 11.5–15.5)
WBC: 5.1 10*3/uL (ref 4.0–10.5)

## 2018-08-18 LAB — FERRITIN: FERRITIN: 26.3 ng/mL (ref 22.0–322.0)

## 2018-08-18 MED ORDER — DOXYCYCLINE HYCLATE 100 MG PO TABS: 100.0000 mg | ORAL_TABLET | Freq: Two times a day (BID) | ORAL | 0 refills | Status: DC

## 2018-08-18 MED ORDER — PREDNISONE 10 MG PO TABS: ORAL_TABLET | ORAL | 0 refills | Status: DC

## 2018-08-18 NOTE — Patient Instructions (Addendum)
  Please schedule Medicare Wellness with Glenard Haring.   GO TO THE LAB : Get the blood work     GO TO THE FRONT DESK Schedule your next appointment for a checkup in 3 to 4 months  Continue your inhalers Take the antibiotic doxycycline Take prednisone for few days, watch for fluid retention Robitussin-DM as needed If you are not back to baseline in the next several days please call me or your pulmonary doctors Watch for increased swelling

## 2018-08-18 NOTE — Progress Notes (Addendum)
Subjective:    Patient ID: Sean Riley, male    DOB: [birth year], 68 y.o.   MRN: [id number]  DOS:  08/18/2018 Type of visit - description: Routine visit, here with his wife. Since the last office visit, was seen multiple specialists, surgery, cardiology and pulmonary; notes reviewed.  Labs reviewed. In general feeling okay. Has gained some weight but the wife believe is because he is eating more after being very anorexic due to a major surgery in 2018.  Edema seems to be controlled. Reports 2.5 weeks history of increased cough and SOB from baseline, sxs are day or night, + white-yellow sputum production. EtOH: Still drinks 2 or 3 beers a night  Wt Readings from Last 3 Encounters:  08/18/18 204 lb 2 oz (92.6 kg)  07/27/18 198 lb 6.4 oz (90 kg)  06/20/18 193 lb (87.5 kg)     Review of Systems  Denies fever chills Mild runny nose and sore throat Denies any palpitations, heart rate at home typically in the 60s Past Medical History:  Diagnosis Date  . Allergy   . Arthritis   . Ascending aortic dissection ([place]) 03/23/2017  . Atrial fibrillation ([place])   . Cataract    removed both eyes   . GERD (gastroesophageal reflux disease)   . HYPERLIPIDEMIA   . 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 Hypertension   . Microscopic hematuria    negative cystoscopy  . NONSPECIFIC ABNORMAL ELECTROCARDIOGRAM   . Pleural effusion on right   . Pleural effusion, bilateral   . S/P aortic dissection repair 03/24/2017   Biological Bentall aortic root replacement + resection and grafting of entire ascending aorta, transverse aortic arch and proximal descending thoracic aorta with elephant trunk distal anastomosis and debranching of aortic arch vessels  . 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  Varicose veins     Past Surgical History:  Procedure Laterality Date  . APPENDECTOMY    . CARDIAC CATHETERIZATION      X3;Dr Caryl Comes, last 01/20/12: mild non-obstructive CAD, normal LV systolic function  . CARDIOVERSION N/A 05/31/2018   Procedure: CARDIOVERSION;  Surgeon: Sueanne Margarita, MD;  Location[facility] ENDOSCOPY;  Service: Cardiovascular;  Laterality: N/A;  . COLONOSCOPY    . CYSTOSCOPY  1978   Dr Hartley Barefoot  . EYE SURGERY    . IR THORACENTESIS ASP PLEURAL SPACE W/IMG GUIDE  11/25/2017  . KNEE ARTHROSCOPY  2012   Dr Theda Sers  . LUMBAR LAMINECTOMY  1987   Dr Durward Fortes  . MYELOGRAM  2007  . PERICARDIOCENTESIS N/A 04/07/2017   Procedure: PERICARDIOCENTESIS;  Surgeon: Sherren Mocha, MD;  Location: [place] CV LAB;  Service: Cardiovascular;  Laterality: N/A;  . POLYPECTOMY    . REPAIR OF ACUTE ASCENDING THORACIC AORTIC DISSECTION N/A 03/23/2017   Procedure: REPAIR OF ACUTE ASCENDING THORACIC AORTIC DISSECTION.  Bentall procedure.  Aortic root repleacement with bioprosthetic valve.  Reimplantation of left and right coronary arteries.  Total resection of transverse aortic arch.  Elephant trunk distal anastomosis and debranching of arch vessels.;  Surgeon: Rexene Alberts, MD;  Location: [place];  Service: Vascular;  Laterality: N/A;  . ROTATOR CUFF REPAIR    . SHOULDER ARTHROSCOPY  2011   Dr Theda Sers  . SHOULDER ARTHROSCOPY Right 08/2015  . SPINAL FUSION  27   Dr Rolin Barry  . TEE WITHOUT CARDIOVERSION N/A 03/23/2017   Procedure: TRANSESOPHAGEAL ECHOCARDIOGRAM (TEE);  Surgeon: Rexene Alberts, MD;  Location: [place];  Service: Open Heart Surgery;  Laterality: N/A;  . THORACIC AORTIC ENDOVASCULAR STENT GRAFT N/A 03/30/2018   Procedure: THORACIC AORTIC ENDOVASCULAR STENT GRAFT WITH INTRAVASCULAR ULTRASOUND;  Surgeon: Serafina Mitchell, MD;  Location: [facility] OR;  Service: Vascular;  Laterality: N/A;  . TRACHEOSTOMY     age 43 for croup    Social History   Socioeconomic History  . Marital status:  Married    Spouse name: Not on file  . Number of children: 2  . Years of education: Not on file  . Highest education level: Not on file  Occupational History  . 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 Financial resource strain: Not on file  . Food insecurity:    Worry: Not on file    Inability: Not on file  . Transportation needs:    Medical: Not on file    Non-medical: Not on file  Tobacco Use  . Smoking status: Former Smoker    Packs/day: 2.00    Years: 45.00    Pack years: 90.00    Types: Cigarettes    Last attempt to quit: 03/22/2017    Years since quitting: 1.4  . Smokeless tobacco: Never Used  . Tobacco comment: 2 ppd , quit 03/2017  Substance and Sexual Activity  . Alcohol use: Yes    Alcohol/week: 0.0 standard drinks    Comment: 5 cans of beer daily  . Drug use: No  . Sexual activity: Never  Lifestyle  . Physical activity:    Days per week: Not on file    Minutes per session: Not on file  . Stress: Not on file  Relationships  . Social connections:    Talks on phone: Not on file    Gets together: Not on file    Attends religious service: Not on file    Active member of club or organization: Not on file    Attends meetings of clubs or organizations: Not on file    Relationship status: Not on file  . Intimate partner violence:    Fear of current or ex partner: Not on file    Emotionally abused: Not on file    Physically abused: Not on file    Forced sexual activity: Not on file  Other Topics Concern  . Not on file  Social History Narrative   Lives w/ wife      Allergies as of 08/18/2018      Reactions   Simvastatin Other (See Comments)   Mental status changes   Celecoxib Other (See Comments)   GI UPSET AND INFLAMMATION   Codeine Other (See Comments)   GI UPSET AND INFLAMMATION   Nsaids Other (See Comments)   GI UPSET AND INFLAMMATION (can tolerate via IV)   Tape Other (See Comments)   Medical tape and Band-Aids PULL OFF THE SKIN;  please use Coban wrap   Cephalexin Itching, Rash   Latex Rash      Medication List       Accurate as of August 18, 2018 10:31 PM. Always use your most recent med list.        albuterol (2.5 MG/3ML) 0.083% nebulizer solution Commonly known as:  PROVENTIL Take 3 mLs (2.5 mg total) by nebulization every 8 (eight) hours as needed for wheezing or shortness of breath.  albuterol 108 (90 Base) MCG/ACT inhaler Commonly known as:  PROAIR HFA Inhale two puffs every four to six hours as needed for cough or wheeze.   ALPRAZolam 1 MG tablet Commonly known as:  XANAX Take 1 tablet (1 mg total) by mouth at bedtime.   apixaban 5 MG Tabs tablet Commonly known as:  ELIQUIS Take 1 tablet (5 mg total) by mouth 2 (two) times daily.   aspirin EC 81 MG tablet Take 1 tablet (81 mg total) by mouth daily.   augmented betamethasone dipropionate 0.05 % cream Commonly known as:  DIPROLENE-AF Apply 1 application topically 2 (two) times daily as needed (itchy skin.).   azelastine 0.1 % nasal spray Commonly known as:  ASTELIN Place 2 sprays into both nostrils 2 (two) times daily as needed for allergies.   diphenhydrAMINE 25 MG tablet Commonly known as:  BENADRYL Take 25 mg by mouth every 8 (eight) hours as needed (allergies/runny nose).   doxycycline 100 MG tablet Commonly known as:  VIBRA-TABS Take 1 tablet (100 mg total) by mouth 2 (two) times daily.   escitalopram 10 MG tablet Commonly known as:  LEXAPRO TAKE 1 TABLET BY MOUTH DAILY.   esomeprazole 40 MG capsule Commonly known as:  NEXIUM Take 1 capsule (40 mg total) by mouth daily before breakfast.   flecainide 100 MG tablet Commonly known as:  TAMBOCOR Take 1 tablet (100 mg total) by mouth 2 (two) times daily.   fluticasone 50 MCG/ACT nasal spray Commonly known as:  FLONASE Place 1 spray into both nostrils 2 (two) times daily as needed for allergies. Use one spray in each nostril twice daily.   fluticasone furoate-vilanterol  200-25 MCG/INH Aepb Commonly known as:  BREO ELLIPTA Inhale 1 puff into the lungs at bedtime.   furosemide 40 MG tablet Commonly known as:  LASIX Take 1 tablet (40 mg total) by mouth 2 (two) times daily.   INCRUSE ELLIPTA 62.5 MCG/INH Aepb Generic drug:  umeclidinium bromide Inhale 1 puff into the lungs at bedtime.   metoprolol tartrate 25 MG tablet Commonly known as:  LOPRESSOR Take 1 tablet (25 mg total) by mouth 2 (two) times daily.   potassium chloride 10 MEQ tablet Commonly known as:  K-DUR Take 1 tablet (10 mEq total) by mouth daily.   predniSONE 10 MG tablet Commonly known as:  DELTASONE 4 tablets x 2 days, 3 tabs x 2 days, 2 tabs x 2 days, 1 tab x 2 days           Objective:   Physical Exam BP 138/72 (BP Location: Right Arm, Patient Position: Sitting, Cuff Size: Normal)   Pulse 72   Temp 98.2 F (36.8 C) (Oral)   Resp 16   Ht 6' (1.829 m)   Wt 204 lb 2 oz (92.6 kg)   SpO2 90%   BMI 27.68 kg/m  General:   Well developed, chronically ill-appearing but not toxic.    HEENT:  Normocephalic . Face symmetric, atraumatic. TMs normal, throat symmetric not red, nose is slightly congested. Lungs:  Few rhonchi Normal respiratory effort, no intercostal retractions, no accessory muscle use. Heart: Seems regular today, soft systolic murmur, increased S2?Marland Kitchen Trace pretibial and peri-ankle edema bilaterally  Skin: Not pale. Not jaundice Neurologic:  alert & oriented X3.  Speech normal, gait assisted by a cane, somewhat limited by general debility, I assisted him getting onto the table Psych--  Cognition and judgment appear intact.  Cooperative with normal attention span and concentration.  Behavior appropriate. No  anxious or depressed appearing.      Assessment     Assessment  HTN Hyperlipidemia GERD Insomnia: xanax prn EtOH MSK: See surgeries Pulmonary  --COPD, s/p  PFTs 12/2013; 2020: Moderate to severe --Smoker, quit 03/2017 (2PPD) --CXR 12/2013  granuloma Allergies-- Dr Neldon Mc  Snoring: Reports a remote (1990s) + sleep study. Tried a cpap. [place] (-) 07-2015 CV: --P- Atrial fibrillation Dr Caryl Comes, on NSR, no anticoag --lexiscan 2013 --Cardiac catheterization July 2013: Normal LV FX,, mild nonobstructive CAD Dr. Angelena Form --thoracis Ao  dissection , acute, surgery 03-2017 --Aortic thoracic aneurysm endovascular stent graft 03-2018 GU:  --BPH, microscopic hematuria (negative cystoscopy 1978) --Elevated PSA: saw urology  04-2016 Gynecomastia, Dr. Loanne Drilling  >> Bx (-) 10-2014 ; took tamoxifen temporarily (self d/c 06-2016), improved Hypogonadism-- dx per Dr Loanne Drilling, numbers improved d/t nolvadex  Sees dermatology : eczema? rx taclonex H/o  Tracheostomy , age 36, croup  PLAN: HTN: Seems well controlled Paroxysmal A. fib, thoracic aorta disease: Saw the vascular vein specialist September 2019 for post-op check,  he was rec  to follow-up in 6 months with a CAT scan. Saw Dr. Caryl Comes in September 2019, heart rate was elevated. Cardioversion 05/31/2018: Saw cardiology again, they decrease metoprolol dose, adjusted Lasix dosing, weight goal 180 pounds. Last visit with cardiology 07/27/2018, noted to have some volume overload.  Patient was educated. At this point he seems to be stable, according to the chart he has gained weight but I think the patient's wife is right, he is increasing his weight due to eating better.  Edema is minimal. Continue Lasix twice a day, potassium supplements, aspirin, Eliquis, flecainide.  Check a BMP High cholesterol: Diet controlled, check FLP Mild persistent anemia: Check a CBC, iron, ferritin Emphysema pleural effusions: Saw pulmonary 06/20/2018, effusions were felt to be due to CHF.  Currently on  albuterol, Breo Ellipta, Incruse Ellipta.  Having exacerbation for the last 2-1/2 weeks, O2 sat is 90%.  At home is typically in the mid 90s but in the last few days has been 90%, high 80s Plan: Prednisone, doxycycline,  call if not gradually better.  Watch fluid retention (s/e from prednisone); increase Lasix temporarily is appropriate as recommended by cardiology. EtOH: Recommend to continue cutting down.  Not ready to quit RTC 3 months F2F > 35 min

## 2018-08-18 NOTE — Assessment & Plan Note (Signed)
HTN: Seems well controlled Paroxysmal A. fib, thoracic aorta disease: Saw the vascular vein specialist September 2019 for post-op check,  he was rec  to follow-up in 6 months with a CAT scan. Saw Dr. Caryl Comes in September 2019, heart rate was elevated. Cardioversion 05/31/2018: Saw cardiology again, they decrease metoprolol dose, adjusted Lasix dosing, weight goal 180 pounds. Last visit with cardiology 07/27/2018, noted to have some volume overload.  Patient was educated. At this point he seems to be stable, according to the chart he has gained weight but I think the patient's wife is right, he is increasing his weight due to eating better.  Edema is minimal. Continue Lasix twice a day, potassium supplements, aspirin, Eliquis, flecainide.  Check a BMP High cholesterol: Diet controlled, check FLP Mild persistent anemia: Check a CBC, iron, ferritin Emphysema pleural effusions: Saw pulmonary 06/20/2018, effusions were felt to be due to CHF.  Currently on  albuterol, Breo Ellipta, Incruse Ellipta.  Having exacerbation for the last 2-1/2 weeks, O2 sat is 90%.  At home is typically in the mid 90s but in the last few days has been 90%, high 80s Plan: Prednisone, doxycycline, call if not gradually better.  Watch fluid retention (s/e from prednisone); increase Lasix temporarily is appropriate as recommended by cardiology. EtOH: Recommend to continue cutting down.  Not ready to quit RTC 3 months

## 2018-08-18 NOTE — Progress Notes (Signed)
Pre visit review using our clinic review tool, if applicable. No additional management support is needed unless otherwise documented below in the visit note. 

## 2018-08-19 MED ORDER — IRON 325 (65 FE) MG PO TABS: 1.0000 | ORAL_TABLET | Freq: Two times a day (BID) | ORAL | 6 refills | Status: DC

## 2018-08-19 NOTE — Addendum Note (Signed)
Addended byDamita Dunnings D on: 08/19/2018 01:12 PM   Modules accepted: Orders

## 2018-08-31 ENCOUNTER — Other Ambulatory Visit: Payer: Self-pay | Admitting: Internal Medicine

## 2018-08-31 MED ORDER — ESCITALOPRAM OXALATE 10 MG PO TABS: 10.0000 mg | ORAL_TABLET | Freq: Every day | ORAL | 0 refills | Status: DC

## 2018-08-31 MED ORDER — IRON 325 (65 FE) MG PO TABS: 1.0000 | ORAL_TABLET | Freq: Two times a day (BID) | ORAL | 0 refills | Status: DC

## 2018-08-31 NOTE — Telephone Encounter (Signed)
Copied from [place] [id number]. Topic: Quick Communication - Rx Refill/Question >> Aug 31, 2018  1:10 PM Demaray, Lenna Sciara wrote: Medication: Ferrous Sulfate (IRON) 325 (65 Fe) MG TABS [[id number]] 90 day supply, escitalopram (LEXAPRO) 10 MG tablet 90 day supply  Has the patient contacted their pharmacy? Yes.   (Agent: If no, request that the patient contact the pharmacy for the refill.) (Agent: If yes, when and what did the pharmacy advise?) need to have refills go to [facility] versus [facility] (with phone number or street name): [place], [place] [phone] (Phone) [phone] (Fax)    Agent: Please be advised that RX refills may take up to 3 business days. We ask that you follow-up with your pharmacy.

## 2018-09-08 DIAGNOSIS — I5021 Acute systolic (congestive) heart failure: Secondary | ICD-10-CM | POA: Diagnosis not present

## 2018-09-12 ENCOUNTER — Ambulatory Visit (INDEPENDENT_AMBULATORY_CARE_PROVIDER_SITE_OTHER): Payer: Medicare Other | Admitting: Internal Medicine

## 2018-09-12 ENCOUNTER — Telehealth: Payer: Self-pay

## 2018-09-12 ENCOUNTER — Encounter: Payer: Self-pay | Admitting: Internal Medicine

## 2018-09-12 ENCOUNTER — Ambulatory Visit (HOSPITAL_BASED_OUTPATIENT_CLINIC_OR_DEPARTMENT_OTHER)
Admission: RE | Admit: 2018-09-12 | Discharge: 2018-09-12 | Disposition: A | Discharge status: Home / Self Care | Payer: Medicare Other | Source: Ambulatory Visit | Attending: Internal Medicine | Admitting: Internal Medicine

## 2018-09-12 VITALS — BP 126/62 | HR 55 | Temp 98.1°F | Resp 16 | Ht 72.0 in | Wt 204.0 lb

## 2018-09-12 DIAGNOSIS — J449 Chronic obstructive pulmonary disease, unspecified: Secondary | ICD-10-CM | POA: Diagnosis not present

## 2018-09-12 DIAGNOSIS — R05 Cough: Secondary | ICD-10-CM

## 2018-09-12 DIAGNOSIS — R0602 Shortness of breath: Secondary | ICD-10-CM | POA: Diagnosis not present

## 2018-09-12 DIAGNOSIS — I509 Heart failure, unspecified: Secondary | ICD-10-CM | POA: Insufficient documentation

## 2018-09-12 DIAGNOSIS — R059 Cough, unspecified: Secondary | ICD-10-CM

## 2018-09-12 NOTE — Telephone Encounter (Signed)
ROI faxed to [facility] at [phone]. ROI sent for scanning.

## 2018-09-12 NOTE — Progress Notes (Signed)
Subjective:    Patient ID: Sean Riley, male    DOB: July 28, 1951, 68 y.o.   MRN: [id number]  DOS:  09/12/2018 Type of visit - description:  Acute visit Was seen here 08/18/2018, at the time his O2 sat were low, in part felt to be due to COPD exacerbation, was prescribed prednisone and doxycycline and he improved. Symptoms started to resurface around 08/26/2018: Increased DOE, weight gain ("1 or 2 pounds"). + wheezing and coughing. Went to urgent care, was evaluated, apparently they contact cardiology and they recommend to increase Lasix temporarily from 40 mg daily, to 80 mg daily.  He is here for follow-up. Is a challenge to get more detailed information from the pt, apparently DOE has not improved much. He continues with cough, wheezing, some sputum production.  Wt Readings from Last 3 Encounters:  09/12/18 204 lb (92.5 kg)  08/18/18 204 lb 2 oz (92.6 kg)  07/27/18 198 lb 6.4 oz (90 kg)    Review of Systems Reports no fever chills Lower extremity edema improved.  Past Medical History:  Diagnosis Date  . Allergy   . Arthritis   . Ascending aortic dissection ([place]) 03/23/2017  . Atrial fibrillation ([place])   . Cataract    removed both eyes   . GERD (gastroesophageal reflux disease)   . HYPERLIPIDEMIA   . 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 Hypertension   . Microscopic hematuria    negative cystoscopy  . NONSPECIFIC ABNORMAL ELECTROCARDIOGRAM   . Pleural effusion on right   . Pleural effusion, bilateral   . S/P aortic dissection repair 03/24/2017   Biological Bentall aortic root replacement + resection and grafting of entire ascending aorta, transverse aortic arch and proximal descending thoracic aorta with elephant trunk distal anastomosis and debranching of aortic arch vessels  . 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 Ease bovine pericardial tissue valve and 28 mm Gelweave Valsalva aortic root graft with  reimplantation of left main and right coronary arteries  . Varicose veins     Past Surgical History:  Procedure Laterality Date  . APPENDECTOMY    . CARDIAC CATHETERIZATION      X3;Dr Caryl Comes, last 01/20/12: mild non-obstructive CAD, normal LV systolic function  . CARDIOVERSION N/A 05/31/2018   Procedure: CARDIOVERSION;  Surgeon: Sueanne Margarita, MD;  Location: [facility] ENDOSCOPY;  Service: Cardiovascular;  Laterality: N/A;  . COLONOSCOPY    . CYSTOSCOPY  1978   Dr Hartley Barefoot  . EYE SURGERY    . IR THORACENTESIS ASP PLEURAL SPACE W/IMG GUIDE  11/25/2017  . KNEE ARTHROSCOPY  2012   Dr Theda Sers  . LUMBAR LAMINECTOMY  1987   Dr Durward Fortes  . MYELOGRAM  2007  . PERICARDIOCENTESIS N/A 04/07/2017   Procedure: PERICARDIOCENTESIS;  Surgeon: Sherren Mocha, MD;  Location: [place] CV LAB;  Service: Cardiovascular;  Laterality: N/A;  . POLYPECTOMY    . REPAIR OF ACUTE ASCENDING THORACIC AORTIC DISSECTION N/A 03/23/2017   Procedure: REPAIR OF ACUTE ASCENDING THORACIC AORTIC DISSECTION.  Bentall procedure.  Aortic root repleacement with bioprosthetic valve.  Reimplantation of left and right coronary arteries.  Total resection of transverse aortic arch.  Elephant trunk distal anastomosis and debranching of arch vessels.;  Surgeon: Rexene Alberts, MD;  Location: [place];  Service: Vascular;  Laterality: N/A;  . ROTATOR CUFF REPAIR    . SHOULDER ARTHROSCOPY  2011   Dr Theda Sers  . SHOULDER ARTHROSCOPY Right  08/2015  . SPINAL FUSION  1986   Dr Rolin Barry  . TEE WITHOUT CARDIOVERSION N/A 03/23/2017   Procedure: TRANSESOPHAGEAL ECHOCARDIOGRAM (TEE);  Surgeon: Rexene Alberts, MD;  Location: [place];  Service: Open Heart Surgery;  Laterality: N/A;  . THORACIC AORTIC ENDOVASCULAR STENT GRAFT N/A 03/30/2018   Procedure: THORACIC AORTIC ENDOVASCULAR STENT GRAFT WITH INTRAVASCULAR ULTRASOUND;  Surgeon: Serafina Mitchell, MD;  Location: [facility] OR;  Service: Vascular;  Laterality: N/A;  . TRACHEOSTOMY     age 64 for croup     Social History   Socioeconomic History  . Marital status: Married    Spouse name: Not on file  . Number of children: 2  . Years of education: Not on file  . Highest education level: Not on file  Occupational History  . 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 Financial resource strain: Not on file  . Food insecurity:    Worry: Not on file    Inability: Not on file  . Transportation needs:    Medical: Not on file    Non-medical: Not on file  Tobacco Use  . Smoking status: Former Smoker    Packs/day: 2.00    Years: 45.00    Pack years: 90.00    Types: Cigarettes    Last attempt to quit: 03/22/2017    Years since quitting: 1.4  . Smokeless tobacco: Never Used  . Tobacco comment: 2 ppd , quit 03/2017  Substance and Sexual Activity  . Alcohol use: Yes    Alcohol/week: 0.0 standard drinks    Comment: 5 cans of beer daily  . Drug use: No  . Sexual activity: Never  Lifestyle  . Physical activity:    Days per week: Not on file    Minutes per session: Not on file  . Stress: Not on file  Relationships  . Social connections:    Talks on phone: Not on file    Gets together: Not on file    Attends religious service: Not on file    Active member of club or organization: Not on file    Attends meetings of clubs or organizations: Not on file    Relationship status: Not on file  . Intimate partner violence:    Fear of current or ex partner: Not on file    Emotionally abused: Not on file    Physically abused: Not on file    Forced sexual activity: Not on file  Other Topics Concern  . Not on file  Social History Narrative   Lives w/ wife      Allergies as of 09/12/2018      Reactions   Simvastatin Other (See Comments)   Mental status changes   Celecoxib Other (See Comments)   GI UPSET AND INFLAMMATION   Codeine Other (See Comments)   GI UPSET AND INFLAMMATION   Nsaids Other (See Comments)   GI UPSET AND INFLAMMATION (can tolerate via IV)   Tape Other  (See Comments)   Medical tape and Band-Aids PULL OFF THE SKIN; please use Coban wrap   Cephalexin Itching, Rash   Latex Rash      Medication List       Accurate as of September 12, 2018 11:59 PM. Always use your most recent med list.        albuterol (2.5 MG/3ML) 0.083% nebulizer solution Commonly known as:  PROVENTIL Take 3 mLs (2.5 mg total) by nebulization every 8 (eight) hours as needed for wheezing  or shortness of breath.   albuterol 108 (90 Base) MCG/ACT inhaler Commonly known as:  PROAIR HFA Inhale two puffs every four to six hours as needed for cough or wheeze.   ALPRAZolam 1 MG tablet Commonly known as:  XANAX Take 1 tablet (1 mg total) by mouth at bedtime.   apixaban 5 MG Tabs tablet Commonly known as:  ELIQUIS Take 1 tablet (5 mg total) by mouth 2 (two) times daily.   aspirin EC 81 MG tablet Take 1 tablet (81 mg total) by mouth daily.   augmented betamethasone dipropionate 0.05 % cream Commonly known as:  DIPROLENE-AF Apply 1 application topically 2 (two) times daily as needed (itchy skin.).   azelastine 0.1 % nasal spray Commonly known as:  ASTELIN Place 2 sprays into both nostrils 2 (two) times daily as needed for allergies.   diphenhydrAMINE 25 MG tablet Commonly known as:  BENADRYL Take 25 mg by mouth every 8 (eight) hours as needed (allergies/runny nose).   escitalopram 10 MG tablet Commonly known as:  LEXAPRO Take 1 tablet (10 mg total) by mouth daily.   esomeprazole 40 MG capsule Commonly known as:  NEXIUM Take 1 capsule (40 mg total) by mouth daily before breakfast.   flecainide 100 MG tablet Commonly known as:  TAMBOCOR Take 1 tablet (100 mg total) by mouth 2 (two) times daily.   fluticasone 50 MCG/ACT nasal spray Commonly known as:  FLONASE Place 1 spray into both nostrils 2 (two) times daily as needed for allergies. Use one spray in each nostril twice daily.   fluticasone furoate-vilanterol 200-25 MCG/INH Aepb Commonly known as:  BREO  ELLIPTA Inhale 1 puff into the lungs at bedtime.   furosemide 40 MG tablet Commonly known as:  LASIX Take 2 tablets (80 mg total) by mouth daily.   INCRUSE ELLIPTA 62.5 MCG/INH Aepb Generic drug:  umeclidinium bromide Inhale 1 puff into the lungs at bedtime.   Iron 325 (65 Fe) MG Tabs Take 1 tablet (325 mg total) by mouth 2 (two) times daily before a meal.   metoprolol tartrate 25 MG tablet Commonly known as:  LOPRESSOR Take 1 tablet (25 mg total) by mouth 2 (two) times daily.   potassium chloride 10 MEQ tablet Commonly known as:  K-DUR Take 1 tablet (10 mEq total) by mouth daily.           Objective:   Physical Exam BP 126/62 (BP Location: Right Arm, Patient Position: Sitting, Cuff Size: Small)   Pulse (!) 55   Temp 98.1 F (36.7 C) (Oral)   Resp 16   Ht 6' (1.829 m)   Wt 204 lb (92.5 kg)   SpO2 98%   BMI 27.67 kg/m  General:   Well developed, NAD, BMI noted. HEENT:  Normocephalic . Face symmetric, atraumatic Neck: Mild increase JVD at 45 degrees Lungs:  Few rhonchi bilaterally. Normal respiratory effort, no intercostal retractions, no accessory muscle use. Heart: RRR, + systolic murmur, more noticeable today?.  Trace peri-ankle edema, no pretibial edema bilaterally  Skin: Not pale. Not jaundice Neurologic:  alert & oriented X3.  Speech normal, gait appropriate for age and unassisted Psych--  Cognition and judgment appear intact.  Cooperative with normal attention span and concentration.  Behavior appropriate. No anxious or depressed appearing.      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 Tried a cpap. [place] (-) 07-2015 CV: --P- Atrial fibrillation Dr Caryl Comes, on NSRb --lexiscan 2013 --Cardiac  catheterization July 2013: Normal LV FX,mild nonobstructive CAD Dr. Angelena Form --thoracis Ao  dissection , acute, surgery 03-2017 --Aortic thoracic aneurysm endovascular stent graft 03-2018 GU:  --BPH, microscopic hematuria (negative cystoscopy 1978) --Elevated PSA: saw urology  04-2016 Gynecomastia, Dr. Loanne Drilling  >> Bx (-) 10-2014 ; took tamoxifen temporarily (self d/c 06-2016), improved Hypogonadism-- dx per Dr Loanne Drilling, numbers improved d/t nolvadex  Sees dermatology : eczema? rx taclonex H/o  Tracheostomy , age 2, croup  PLAN: COPD, Cough, CHF?: Patient presents today after being seen at the urgent care last week with wheezing, cough, chest congestion. Were getting the records. Although he reports he is "still wheezing and coughing" exam today is okay except for a few rhonchi. He reports no fever or chills. He saw pulmonology due to pleural effusions11/06/2018 they were felt to be due to CHF. Of note is, at the last visit I was under the impression he was taking Lasix 40 mg B.I.D., he was only taking it once daily. Unclear if symptoms are actually  COPD exacerbation versus CHF ("cardiac asthma"). I noted his weight has increased a little compared to December. Plan: Chest x-ray, echocardiogram r/o CHF, increase Lasix to 80 mg in the morning. Follow-up in 10 days for BMP and check clinical status. Weight himself RTC 10 days

## 2018-09-12 NOTE — Progress Notes (Signed)
Pre visit review using our clinic review tool, if applicable. No additional management support is needed unless otherwise documented below in the visit note. 

## 2018-09-12 NOTE — Patient Instructions (Signed)
  GO TO THE FRONT DESK Schedule your next appointment   For a check up in 10 days    STOP BY THE FIRST FLOOR:  get the XR     Check the  blood pressure   daily Be sure your blood pressure is between 110/65 and  135/85. If it is consistently higher or lower, let me know  Check you weight daily with no cloths , bring a log

## 2018-09-13 ENCOUNTER — Telehealth: Payer: Self-pay

## 2018-09-13 MED ORDER — FUROSEMIDE 40 MG PO TABS: 80.0000 mg | ORAL_TABLET | Freq: Every day | ORAL | 1 refills | Status: DC

## 2018-09-13 MED ORDER — OSELTAMIVIR PHOSPHATE 75 MG PO CAPS: 75.0000 mg | ORAL_CAPSULE | Freq: Every day | ORAL | 0 refills | Status: DC

## 2018-09-13 NOTE — Telephone Encounter (Signed)
Send Tamiflu 75 mg 1 p.o. daily #10 no refills

## 2018-09-13 NOTE — Telephone Encounter (Signed)
Please advise 

## 2018-09-13 NOTE — Assessment & Plan Note (Signed)
COPD, Cough, CHF?: Patient presents today after being seen at the urgent care last week with wheezing, cough, chest congestion. Were getting the records. Although he reports he is "still wheezing and coughing" exam today is okay except for a few rhonchi. He reports no fever or chills. He saw pulmonology due to pleural effusions11/06/2018 they were felt to be due to CHF. Of note is, at the last visit I was under the impression he was taking Lasix 40 mg B.I.D., he was only taking it once daily. Unclear if symptoms are actually  COPD exacerbation versus CHF ("cardiac asthma"). I noted his weight has increased a little compared to December. Plan: Chest x-ray, echocardiogram r/o CHF, increase Lasix to 80 mg in the morning. Follow-up in 10 days for BMP and check clinical status. Weight himself RTC 10 days

## 2018-09-13 NOTE — Telephone Encounter (Signed)
Copied from [place] [id number]. Topic: General - Other >> Sep 13, 2018 10:48 AM Lennox Solders wrote: Reason for CRM: pt saw dr Larose Kells yesterday and his grandson was dx with the flu today and he was around his grandson on sat night. Pt is not having any symptoms. Pt wife would like to know what is incubation period and she he get rx for tamiflu. Pt has lung issues >> Sep 13, 2018 10:59 AM Lennox Solders wrote: Jasmine December randleman [place]

## 2018-09-13 NOTE — Telephone Encounter (Signed)
Spoke w/ Pt's wife [name]- informed that Tamiflu has been sent.

## 2018-09-15 ENCOUNTER — Other Ambulatory Visit: Payer: Self-pay

## 2018-09-15 DIAGNOSIS — I712 Thoracic aortic aneurysm, without rupture, unspecified: Secondary | ICD-10-CM

## 2018-09-22 ENCOUNTER — Ambulatory Visit (INDEPENDENT_AMBULATORY_CARE_PROVIDER_SITE_OTHER): Payer: Medicare Other | Admitting: Internal Medicine

## 2018-09-22 ENCOUNTER — Encounter: Payer: Self-pay | Admitting: Internal Medicine

## 2018-09-22 VITALS — BP 126/82 | HR 59 | Temp 98.2°F | Resp 16 | Ht 72.0 in | Wt 201.4 lb

## 2018-09-22 DIAGNOSIS — I10 Essential (primary) hypertension: Secondary | ICD-10-CM

## 2018-09-22 DIAGNOSIS — R05 Cough: Secondary | ICD-10-CM | POA: Diagnosis not present

## 2018-09-22 DIAGNOSIS — R059 Cough, unspecified: Secondary | ICD-10-CM

## 2018-09-22 LAB — BASIC METABOLIC PANEL
BUN: 18 mg/dL (ref 6–23)
CALCIUM: 9.2 mg/dL (ref 8.4–10.5)
CO2: 36 mEq/L — ABNORMAL HIGH (ref 19–32)
CREATININE: 1.07 mg/dL (ref 0.40–1.50)
Chloride: 93 mEq/L — ABNORMAL LOW (ref 96–112)
GFR: 68.56 mL/min (ref >60.00)
GLUCOSE: 74 mg/dL (ref 70–99)
POTASSIUM: 4 meq/L (ref 3.5–5.1)
SODIUM: 135 meq/L (ref 135–145)

## 2018-09-22 MED ORDER — ZOSTER VAC RECOMB ADJUVANTED 50 MCG/0.5ML IM SUSR: 0.5000 mL | Freq: Once | INTRAMUSCULAR | 1 refills | Status: DC

## 2018-09-22 MED ORDER — TETANUS-DIPHTH-ACELL PERTUSSIS 5-2.5-18.5 LF-MCG/0.5 IM SUSP: 0.5000 mL | Freq: Once | INTRAMUSCULAR | 0 refills | Status: AC

## 2018-09-22 NOTE — Progress Notes (Signed)
Pre visit review using our clinic review tool, if applicable. No additional management support is needed unless otherwise documented below in the visit note. 

## 2018-09-22 NOTE — Patient Instructions (Addendum)
Please schedule Medicare Wellness with Glenard Haring.    GO TO THE LAB : Get the blood work     GO TO THE FRONT DESK Schedule your next appointment   checkup in 6 to 8 weeks  Continue same medications  Leg elevation and a low-salt diet are very important  Continue checking your weight daily, be sure to stay around 199 pounds.  If you increase more than 3 pounds in a single week please call

## 2018-09-22 NOTE — Progress Notes (Signed)
Subjective:    Patient ID: Sean Riley, male    DOB: 1950/12/29, 68 y.o.   MRN: [id number]  DOS:  09/22/2018 Type of visit - description: f/u Here with his wife. Since the last office visit he is doing better. Good compliance with medication.  Wheezing, chest congestion and cough are definitely decreased.   Wt Readings from Last 3 Encounters:  09/22/18 201 lb 6 oz (91.3 kg)  09/12/18 204 lb (92.5 kg)  08/18/18 204 lb 2 oz (92.6 kg)     Review of Systems Denies fever chills Edema slightly decreased. Shortness of breath decreased. The occasional cough he has he think is related to allergies and postnasal dripping.  Past Medical History:  Diagnosis Date  . Allergy   . Arthritis   . Ascending aortic dissection ([place]) 03/23/2017  . Atrial fibrillation ([place])   . Cataract    removed both eyes   . GERD (gastroesophageal reflux disease)   . HYPERLIPIDEMIA   . 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 Hypertension   . Microscopic hematuria    negative cystoscopy  . NONSPECIFIC ABNORMAL ELECTROCARDIOGRAM   . Pleural effusion on right   . Pleural effusion, bilateral   . S/P aortic dissection repair 03/24/2017   Biological Bentall aortic root replacement + resection and grafting of entire ascending aorta, transverse aortic arch and proximal descending thoracic aorta with elephant trunk distal anastomosis and debranching of aortic arch vessels  . 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 Varicose veins     Past Surgical History:  Procedure Laterality Date  . APPENDECTOMY    . CARDIAC CATHETERIZATION      X3;Dr Caryl Comes, last 01/20/12: mild non-obstructive CAD, normal LV systolic function  . CARDIOVERSION N/A 05/31/2018   Procedure: CARDIOVERSION;  Surgeon: Sueanne Margarita,  MD;  Location: [facility] ENDOSCOPY;  Service: Cardiovascular;  Laterality: N/A;  . COLONOSCOPY    . CYSTOSCOPY  1978   Dr Hartley Barefoot  . EYE SURGERY    . IR THORACENTESIS ASP PLEURAL SPACE W/IMG GUIDE  11/25/2017  . KNEE ARTHROSCOPY  2012   Dr Theda Sers  . LUMBAR LAMINECTOMY  1987   Dr Durward Fortes  . MYELOGRAM  2007  . PERICARDIOCENTESIS N/A 04/07/2017   Procedure: PERICARDIOCENTESIS;  Surgeon: Sherren Mocha, MD;  Location: [place] CV LAB;  Service: Cardiovascular;  Laterality: N/A;  . POLYPECTOMY    . REPAIR OF ACUTE ASCENDING THORACIC AORTIC DISSECTION N/A 03/23/2017   Procedure: REPAIR OF ACUTE ASCENDING THORACIC AORTIC DISSECTION.  Bentall procedure.  Aortic root repleacement with bioprosthetic valve.  Reimplantation of left and right coronary arteries.  Total resection of transverse aortic arch.  Elephant trunk distal anastomosis and debranching of arch vessels.;  Surgeon: Rexene Alberts, MD;  Location: [place];  Service: Vascular;  Laterality: N/A;  . ROTATOR CUFF REPAIR    . SHOULDER ARTHROSCOPY  2011   Dr Theda Sers  . SHOULDER ARTHROSCOPY Right 08/2015  . SPINAL FUSION  1986   Dr Rolin Barry  . TEE WITHOUT CARDIOVERSION N/A 03/23/2017   Procedure: TRANSESOPHAGEAL ECHOCARDIOGRAM (TEE);  Surgeon: Rexene Alberts, MD;  Location: [place];  Service: Open Heart Surgery;  Laterality: N/A;  . THORACIC AORTIC ENDOVASCULAR STENT GRAFT N/A 03/30/2018   Procedure: THORACIC AORTIC ENDOVASCULAR STENT GRAFT WITH INTRAVASCULAR  ULTRASOUND;  Surgeon: Serafina Mitchell, MD;  Location: [facility] OR;  Service: Vascular;  Laterality: N/A;  . TRACHEOSTOMY     age 51 for croup    Social History   Socioeconomic History  . Marital status: Married    Spouse name: Not on file  . Number of children: 2  . Years of education: Not on file  . Highest education level: Not on file  Occupational History  . 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 Financial resource strain: Not on file  . Food insecurity:    Worry:  Not on file    Inability: Not on file  . Transportation needs:    Medical: Not on file    Non-medical: Not on file  Tobacco Use  . Smoking status: Former Smoker    Packs/day: 2.00    Years: 45.00    Pack years: 90.00    Types: Cigarettes    Last attempt to quit: 03/22/2017    Years since quitting: 1.5  . Smokeless tobacco: Never Used  . Tobacco comment: 2 ppd , quit 03/2017  Substance and Sexual Activity  . Alcohol use: Yes    Alcohol/week: 0.0 standard drinks    Comment: 5 cans of beer daily  . Drug use: No  . Sexual activity: Never  Lifestyle  . Physical activity:    Days per week: Not on file    Minutes per session: Not on file  . Stress: Not on file  Relationships  . Social connections:    Talks on phone: Not on file    Gets together: Not on file    Attends religious service: Not on file    Active member of club or organization: Not on file    Attends meetings of clubs or organizations: Not on file    Relationship status: Not on file  . Intimate partner violence:    Fear of current or ex partner: Not on file    Emotionally abused: Not on file    Physically abused: Not on file    Forced sexual activity: Not on file  Other Topics Concern  . Not on file  Social History Narrative   Lives w/ wife      Allergies as of 09/22/2018      Reactions   Simvastatin Other (See Comments)   Mental status changes   Celecoxib Other (See Comments)   GI UPSET AND INFLAMMATION   Codeine Other (See Comments)   GI UPSET AND INFLAMMATION   Nsaids Other (See Comments)   GI UPSET AND INFLAMMATION (can tolerate via IV)   Tape Other (See Comments)   Medical tape and Band-Aids PULL OFF THE SKIN; please use Coban wrap   Cephalexin Itching, Rash   Latex Rash      Medication List       Accurate as of September 22, 2018 11:59 PM. Always use your most recent med list.        albuterol (2.5 MG/3ML) 0.083% nebulizer solution Commonly known as:  PROVENTIL Take 3 mLs (2.5 mg total) by  nebulization every 8 (eight) hours as needed for wheezing or shortness of breath.   albuterol 108 (90 Base) MCG/ACT inhaler Commonly known as:  PROAIR HFA Inhale two puffs every four to six hours as needed for cough or wheeze.   ALPRAZolam 1 MG tablet Commonly known as:  XANAX Take 1 tablet (1 mg total) by mouth at bedtime.   apixaban 5 MG Tabs tablet Commonly  known as:  ELIQUIS Take 1 tablet (5 mg total) by mouth 2 (two) times daily.   aspirin EC 81 MG tablet Take 1 tablet (81 mg total) by mouth daily.   augmented betamethasone dipropionate 0.05 % cream Commonly known as:  DIPROLENE-AF Apply 1 application topically 2 (two) times daily as needed (itchy skin.).   azelastine 0.1 % nasal spray Commonly known as:  ASTELIN Place 2 sprays into both nostrils 2 (two) times daily as needed for allergies.   diphenhydrAMINE 25 MG tablet Commonly known as:  BENADRYL Take 25 mg by mouth every 8 (eight) hours as needed (allergies/runny nose).   escitalopram 10 MG tablet Commonly known as:  LEXAPRO Take 1 tablet (10 mg total) by mouth daily.   esomeprazole 40 MG capsule Commonly known as:  NEXIUM Take 1 capsule (40 mg total) by mouth daily before breakfast.   flecainide 100 MG tablet Commonly known as:  TAMBOCOR Take 1 tablet (100 mg total) by mouth 2 (two) times daily.   fluticasone 50 MCG/ACT nasal spray Commonly known as:  FLONASE Place 1 spray into both nostrils 2 (two) times daily as needed for allergies. Use one spray in each nostril twice daily.   fluticasone furoate-vilanterol 200-25 MCG/INH Aepb Commonly known as:  BREO ELLIPTA Inhale 1 puff into the lungs at bedtime.   furosemide 40 MG tablet Commonly known as:  LASIX Take 2 tablets (80 mg total) by mouth daily.   INCRUSE ELLIPTA 62.5 MCG/INH Aepb Generic drug:  umeclidinium bromide Inhale 1 puff into the lungs at bedtime.   Iron 325 (65 Fe) MG Tabs Take 1 tablet (325 mg total) by mouth 2 (two) times daily before  a meal.   metoprolol tartrate 25 MG tablet Commonly known as:  LOPRESSOR Take 1 tablet (25 mg total) by mouth 2 (two) times daily.   potassium chloride 10 MEQ tablet Commonly known as:  K-DUR Take 1 tablet (10 mEq total) by mouth daily.   Tdap 5-2.5-18.5 LF-MCG/0.5 injection Commonly known as:  BOOSTRIX Inject 0.5 mLs into the muscle once for 1 dose.   Zoster Vaccine Adjuvanted injection Commonly known as:  SHINGRIX Inject 0.5 mLs into the muscle once for 1 dose.           Objective:   Physical Exam BP 126/82 (BP Location: Right Arm, Patient Position: Sitting, Cuff Size: Small)   Pulse (!) 59   Temp 98.2 F (36.8 C) (Oral)   Resp 16   Ht 6' (1.829 m)   Wt 201 lb 6 oz (91.3 kg)   SpO2 95%   BMI 27.31 kg/m  General:   Well developed, NAD, BMI noted. HEENT:  Normocephalic . Face symmetric, atraumatic Lungs:  Clear to auscultation bilaterally.   Normal respiratory effort, no intercostal retractions, no accessory muscle use. Heart: RRR, + systolic murmur .  Trace peri-ankle edema, no pretibial edema bilaterally  Skin: Not pale. Not jaundice Neurologic:  alert & oriented X3.  Speech normal, gait appropriate for age and unassisted Psych--  Cognition and judgment appear intact.  Cooperative with normal attention span and concentration.  Behavior appropriate. No anxious or depressed appearing.      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 Tried a cpap. [place] (-) 07-2015 CV: --P- Atrial fibrillation Dr  Caryl Comes, on NSRb --lexiscan 2013 --Cardiac catheterization July 2013: Normal LV FX,mild nonobstructive CAD Dr. Angelena Form --thoracis Ao  dissection , acute, surgery 03-2017 --Aortic thoracic aneurysm endovascular stent  graft 03-2018 GU:  --BPH, microscopic hematuria (negative cystoscopy 1978) --Elevated PSA: saw urology  04-2016 Gynecomastia, Dr. Loanne Drilling  >> Bx (-) 10-2014 ; took tamoxifen temporarily (self d/c 06-2016), improved Hypogonadism-- dx per Dr Loanne Drilling, numbers improved d/t nolvadex  Sees dermatology : eczema? rx taclonex H/o  Tracheostomy , age 89, croup  PLAN: COPD, cough, CHF?: Since the last office visit, a chest x-ray was essentially at baseline, good compliance with increased dose of Lasix and potassium.  He is definitely improved, weight has decreased 3 pounds per our scales.  Weight at home fluctuates between 200-199.  Recommend to continue monitoring.  See AVS. Echocardiogram is pending.  We will check a BMP.  Refill medications with results. HTN: Currently on potassium, metoprolol, Lasix. Labs  RTC 6 to 8 weeks

## 2018-09-23 ENCOUNTER — Ambulatory Visit (HOSPITAL_COMMUNITY): Payer: Medicare Other | Attending: Cardiology

## 2018-09-23 DIAGNOSIS — J449 Chronic obstructive pulmonary disease, unspecified: Secondary | ICD-10-CM | POA: Insufficient documentation

## 2018-09-23 DIAGNOSIS — I509 Heart failure, unspecified: Secondary | ICD-10-CM | POA: Diagnosis not present

## 2018-09-23 NOTE — Telephone Encounter (Signed)
Have note received records- have faxed them again to [facility]. Requesting they be faxed to [name] ASAP at [phone].

## 2018-09-23 NOTE — Assessment & Plan Note (Signed)
COPD, cough, CHF?: Since the last office visit, a chest x-ray was essentially at baseline, good compliance with increased dose of Lasix and potassium.  He is definitely improved, weight has decreased 3 pounds per our scales.  Weight at home fluctuates between 200-199.  Recommend to continue monitoring.  See AVS. Echocardiogram is pending.  We will check a BMP.  Refill medications with results. HTN: Currently on potassium, metoprolol, Lasix. Labs  RTC 6 to 8 weeks

## 2018-09-23 NOTE — Telephone Encounter (Signed)
Records received- placed in MD red folder.

## 2018-09-23 NOTE — Telephone Encounter (Signed)
Records reviewed, he was seen at the urgent care 09/08/2018, chest x-ray was consistent with CHF, no definite pneumonia.  He got Rocephin and a Kenalog injection. They talk with cardiology and they recommend to increase Lasix for 3 days. Chest x-ray report reviewed.

## 2018-09-28 ENCOUNTER — Encounter: Payer: Self-pay | Admitting: Internal Medicine

## 2018-10-04 ENCOUNTER — Other Ambulatory Visit: Payer: Self-pay | Admitting: Internal Medicine

## 2018-10-24 ENCOUNTER — Other Ambulatory Visit: Payer: Self-pay | Admitting: Internal Medicine

## 2018-10-24 DIAGNOSIS — R600 Localized edema: Secondary | ICD-10-CM

## 2018-10-24 NOTE — Telephone Encounter (Signed)
Copied from [place] [phone]. Topic: Quick Communication - Rx Refill/Question >> Oct 24, 2018  2:58 PM Mcneil, Ja-Kwan wrote: Medication: potassium chloride (K-DUR) 10 MEQ tablet  Has the patient contacted their pharmacy? no   Preferred Pharmacy (with phone number or street name): [place], [place] [phone] (Phone)  [phone] (Fax)  Agent: Please be advised that RX refills may take up to 3 business days. We ask that you follow-up with your pharmacy.

## 2018-10-25 ENCOUNTER — Other Ambulatory Visit: Payer: Medicare Other

## 2018-10-31 ENCOUNTER — Ambulatory Visit: Payer: Medicare Other | Admitting: Surgery

## 2018-11-02 ENCOUNTER — Telehealth: Payer: Self-pay | Admitting: Internal Medicine

## 2018-11-02 DIAGNOSIS — R600 Localized edema: Secondary | ICD-10-CM

## 2018-11-02 NOTE — Telephone Encounter (Signed)
Copied from [place] [id number]. Topic: Quick Communication - Rx Refill/Question >> Nov 02, 2018  4:42 PM Gustavus Messing wrote: Medication: potassium chloride (K-DUR) 10 MEQ tablet [[id number]]   Has the patient contacted their pharmacy? No. (Agent: If no, request that the patient contact the pharmacy for the refill.) Patient's wife called on 10/24/2018 and nothing has been done. Patient needs this medication or he will have major consequences like heart failure    Preferred Pharmacy (with phone number or street name): [place], [place] [phone] (Phone) [phone] (Fax)    Agent: Please be advised that RX refills may take up to 3 business days. We ask that you follow-up with your pharmacy.

## 2018-11-03 MED ORDER — POTASSIUM CHLORIDE ER 10 MEQ PO TBCR: 10.0000 meq | EXTENDED_RELEASE_TABLET | Freq: Every day | ORAL | 1 refills | Status: DC

## 2018-11-03 NOTE — Telephone Encounter (Signed)
Refills sent

## 2018-11-08 ENCOUNTER — Other Ambulatory Visit: Payer: Self-pay | Admitting: Internal Medicine

## 2018-11-17 ENCOUNTER — Ambulatory Visit: Payer: Medicare Other | Admitting: Internal Medicine

## 2018-11-22 ENCOUNTER — Other Ambulatory Visit: Payer: Medicare Other

## 2018-11-28 ENCOUNTER — Ambulatory Visit: Payer: Medicare Other | Admitting: Surgery

## 2018-12-05 ENCOUNTER — Ambulatory Visit: Payer: Medicare Other | Admitting: Internal Medicine

## 2018-12-26 ENCOUNTER — Telehealth: Payer: Self-pay | Admitting: Internal Medicine

## 2018-12-26 NOTE — Telephone Encounter (Signed)
Copied from [place] [phone]. Topic: Quick Communication - Rx Refill/Question >> Dec 26, 2018  4:22 PM Pauline Good wrote: Medication: Xanax 1mg   Has the patient contacted their pharmacy? No (Agent: If no, request that the patient contact the pharmacy for the refill.) pt has changed pharmacy (Agent: If yes, when and what did the pharmacy advise?)  Preferred Pharmacy (with phone number or street name): [facility]   Agent: Please be advised that RX refills may take up to 3 business days. We ask that you follow-up with your pharmacy.

## 2018-12-27 MED ORDER — ALPRAZOLAM 1 MG PO TABS: 1.0000 mg | ORAL_TABLET | Freq: Every day | ORAL | 1 refills | Status: DC

## 2018-12-27 NOTE — Telephone Encounter (Signed)
sent 

## 2018-12-27 NOTE — Telephone Encounter (Signed)
Alprazolam refill.   Last OV: 09/22/2018 Last Fill: 06/23/2018 #40 and 3RF UDS: 04/18/2018 Low risk

## 2018-12-28 ENCOUNTER — Encounter: Payer: Self-pay | Admitting: Internal Medicine

## 2018-12-28 ENCOUNTER — Ambulatory Visit (INDEPENDENT_AMBULATORY_CARE_PROVIDER_SITE_OTHER): Payer: Medicare Other | Admitting: Internal Medicine

## 2018-12-28 ENCOUNTER — Other Ambulatory Visit: Payer: Self-pay

## 2018-12-28 DIAGNOSIS — D649 Anemia, unspecified: Secondary | ICD-10-CM

## 2018-12-28 DIAGNOSIS — G4700 Insomnia, unspecified: Secondary | ICD-10-CM

## 2018-12-28 DIAGNOSIS — J449 Chronic obstructive pulmonary disease, unspecified: Secondary | ICD-10-CM | POA: Diagnosis not present

## 2018-12-28 DIAGNOSIS — I10 Essential (primary) hypertension: Secondary | ICD-10-CM

## 2018-12-28 DIAGNOSIS — E785 Hyperlipidemia, unspecified: Secondary | ICD-10-CM

## 2018-12-28 DIAGNOSIS — I480 Paroxysmal atrial fibrillation: Secondary | ICD-10-CM | POA: Diagnosis not present

## 2018-12-28 NOTE — Progress Notes (Signed)
Subjective:    Patient ID: Sean Riley, male    DOB: Apr 04, 1951, 68 y.o.   MRN: [id number]  DOS:  12/28/2018 Type of visit - description: Attempted  to make this a video visit, due to technical difficulties from the patient side it was not possible  thus we proceeded with a Virtual Visit via Telephone    I connected with@ on 12/29/18 at  3:20 PM EDT by telephone and verified that I am speaking with the correct person using two identifiers.  THIS ENCOUNTER IS A VIRTUAL VISIT DUE TO COVID-19 - PATIENT WAS NOT SEEN IN THE OFFICE. PATIENT HAS CONSENTED TO VIRTUAL VISIT / TELEMEDICINE VISIT   Location of patient: home  Location of provider: office  I discussed the limitations, risks, security and privacy concerns of performing an evaluation and management service by telephone and the availability of in person appointments. I also discussed with the patient that there may be a patient responsible charge related to this service. The patient expressed understanding and agreed to proceed.   History of Present Illness: Routine visit In general feeling well. Good COVID-19 precautions HTN: Good medication compliance, ambulatory BP are satisfactory. History of anemia, on iron supplements, intolerant to 2 tablets a day due to GI symptoms, currently taking only 1. Allergies: This is his main concern, symptoms started early in the spring: Nasal discharge, head congestion.  Not using nasal sprays or OTCs regularly.  Review of Systems  Denies fever chills No chest pain, breathing at baseline. Lower extremity edema decreased by using compression stockings Have some cough but he feels it is due to postnasal dripping.  Occasionally clear sputum. No wheezing.   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   Atrial fibrillation ([facility])    Cataract    removed both eyes    GERD (gastroesophageal reflux disease)     HYPERLIPIDEMIA    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 LUTS    Hypertension    Microscopic hematuria    negative cystoscopy   NONSPECIFIC ABNORMAL ELECTROCARDIOGRAM    Pleural effusion on right    Pleural effusion, bilateral    S/P aortic dissection repair 03/24/2017   Biological Bentall aortic root replacement + resection and grafting of entire ascending aorta, transverse aortic arch and proximal descending thoracic aorta with elephant trunk distal anastomosis and debranching of aortic arch vessels   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   25 mm [facility] Ease bovine pericardial tissue valve and 28 mm Gelweave Valsalva aortic root graft with reimplantation of left main and right coronary arteries   Varicose veins     Past Surgical History:  Procedure Laterality Date   APPENDECTOMY     CARDIAC CATHETERIZATION      X3;Dr Caryl Comes, last 01/20/12: mild non-obstructive CAD, normal LV systolic function   CARDIOVERSION N/A 05/31/2018   Procedure: CARDIOVERSION;  Surgeon: Sueanne Margarita, MD;  Location: [facility];  Service: Cardiovascular;  Laterality: N/A;   COLONOSCOPY     CYSTOSCOPY  1978   Dr Hartley Barefoot   EYE SURGERY     IR THORACENTESIS ASP PLEURAL SPACE W/IMG GUIDE  11/25/2017   KNEE ARTHROSCOPY  2012   Dr Theda Sers   LUMBAR LAMINECTOMY  1987   Dr Lestine Box  2007   PERICARDIOCENTESIS N/A 04/07/2017   Procedure: PERICARDIOCENTESIS;  Surgeon: Sherren Mocha, MD;  Location: [facility]  CV LAB;  Service: Cardiovascular;  Laterality: N/A;   POLYPECTOMY     REPAIR OF ACUTE ASCENDING THORACIC AORTIC DISSECTION N/A 03/23/2017   Procedure: REPAIR OF ACUTE ASCENDING THORACIC AORTIC DISSECTION.  Bentall procedure.  Aortic root repleacement with bioprosthetic valve.  Reimplantation of left and right coronary arteries.  Total resection of transverse aortic arch.  Elephant trunk distal anastomosis and debranching of arch  vessels.;  Surgeon: Rexene Alberts, MD;  Location: [facility];  Service: Vascular;  Laterality: N/A;   ROTATOR CUFF REPAIR     SHOULDER ARTHROSCOPY  2011   Dr Theda Sers   SHOULDER ARTHROSCOPY Right 08/2015   SPINAL FUSION  1986   Dr Rolin Barry   TEE WITHOUT CARDIOVERSION N/A 03/23/2017   Procedure: TRANSESOPHAGEAL ECHOCARDIOGRAM (TEE);  Surgeon: Rexene Alberts, MD;  Location: [facility];  Service: Open Heart Surgery;  Laterality: N/A;   THORACIC AORTIC ENDOVASCULAR STENT GRAFT N/A 03/30/2018   Procedure: THORACIC AORTIC ENDOVASCULAR STENT GRAFT WITH INTRAVASCULAR ULTRASOUND;  Surgeon: Serafina Mitchell, MD;  Location: [facility];  Service: Vascular;  Laterality: N/A;   TRACHEOSTOMY     age 24 for croup    Social History   Socioeconomic History   Marital status: Married    Spouse name: Not on file   Number of children: 2   Years of education: Not on file   Highest education level: Not on file  Occupational History   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   Scientist, product/process development strain: Not on file   Food insecurity:    Worry: Not on file    Inability: Not on file   Transportation needs:    Medical: Not on file    Non-medical: Not on file  Tobacco Use   Smoking status: Former Smoker    Packs/day: 2.00    Years: 45.00    Pack years: 90.00    Types: Cigarettes    Last attempt to quit: 03/22/2017    Years since quitting: 1.7   Smokeless tobacco: Never Used   Tobacco comment: 2 ppd , quit 03/2017  Substance and Sexual Activity   Alcohol use: Yes    Alcohol/week: 0.0 standard drinks    Comment: 5 cans of beer daily   Drug use: No   Sexual activity: Never  Lifestyle   Physical activity:    Days per week: Not on file    Minutes per session: Not on file   Stress: Not on file  Relationships   Social connections:    Talks on phone: Not on file    Gets together: Not on file    Attends religious service: Not on file    Active member of club or organization:  Not on file    Attends meetings of clubs or organizations: Not on file    Relationship status: Not on file   Intimate partner violence:    Fear of current or ex partner: Not on file    Emotionally abused: Not on file    Physically abused: Not on file    Forced sexual activity: Not on file  Other Topics Concern   Not on file  Social History Narrative   Lives w/ wife      Allergies as of 12/28/2018      Reactions   Simvastatin Other (See Comments)   Mental status changes   Celecoxib Other (See Comments)   GI UPSET AND INFLAMMATION   Codeine Other (See Comments)   GI UPSET AND INFLAMMATION  Nsaids Other (See Comments)   GI UPSET AND INFLAMMATION (can tolerate via IV)   Tape Other (See Comments)   Medical tape and Band-Aids PULL OFF THE SKIN; please use Coban wrap   Cephalexin Itching, Rash   Latex Rash      Medication List       Accurate as of Dec 28, 2018 11:59 PM. If you have any questions, ask your nurse or doctor.        albuterol (2.5 MG/3ML) 0.083% nebulizer solution Commonly known as:  PROVENTIL Take 3 mLs (2.5 mg total) by nebulization every 8 (eight) hours as needed for wheezing or shortness of breath.   albuterol 108 (90 Base) MCG/ACT inhaler Commonly known as:  ProAir HFA Inhale two puffs every four to six hours as needed for cough or wheeze.   ALPRAZolam 1 MG tablet Commonly known as:  XANAX Take 1-1.5 tablets (1-1.5 mg total) by mouth at bedtime.   aspirin EC 81 MG tablet Take 1 tablet (81 mg total) by mouth daily.   augmented betamethasone dipropionate 0.05 % cream Commonly known as:  DIPROLENE-AF Apply 1 application topically 2 (two) times daily as needed (itchy skin.).   azelastine 0.1 % nasal spray Commonly known as:  ASTELIN Place 2 sprays into both nostrils 2 (two) times daily as needed for allergies.   diphenhydrAMINE 25 MG tablet Commonly known as:  BENADRYL Take 25 mg by mouth every 8 (eight) hours as needed (allergies/runny nose).    Eliquis 5 MG Tabs tablet Generic drug:  apixaban TAKE 1 TABLET TWICE A DAY   escitalopram 10 MG tablet Commonly known as:  LEXAPRO Take 1 tablet (10 mg total) by mouth daily.   esomeprazole 40 MG capsule Commonly known as:  NexIUM Take 1 capsule (40 mg total) by mouth daily before breakfast.   flecainide 100 MG tablet Commonly known as:  TAMBOCOR Take 1 tablet (100 mg total) by mouth 2 (two) times daily.   fluticasone 50 MCG/ACT nasal spray Commonly known as:  FLONASE Place 1 spray into both nostrils 2 (two) times daily as needed for allergies. Use one spray in each nostril twice daily.   fluticasone furoate-vilanterol 200-25 MCG/INH Aepb Commonly known as:  Breo Ellipta Inhale 1 puff into the lungs at bedtime.   furosemide 40 MG tablet Commonly known as:  LASIX Take 2 tablets (80 mg total) by mouth daily.   Incruse Ellipta 62.5 MCG/INH Aepb Generic drug:  umeclidinium bromide Inhale 1 puff into the lungs at bedtime.   Iron 325 (65 Fe) MG Tabs Take 1 tablet (325 mg total) by mouth daily. What changed:  when to take this Changed by:  Kathlene November, MD   metoprolol tartrate 25 MG tablet Commonly known as:  LOPRESSOR Take 1 tablet (25 mg total) by mouth 2 (two) times daily.   potassium chloride 10 MEQ tablet Commonly known as:  K-DUR Take 1 tablet (10 mEq total) by mouth daily.           Objective:   Physical Exam There were no vitals taken for this visit. 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 Tried a cpap. [place] (-) 07-2015 CV: --P-  Atrial fibrillation Dr Caryl Comes, on NSRb --lexiscan 2013 --Cardiac catheterization July 2013:  Normal LV FX,mild nonobstructive CAD Dr. Angelena Form --thoracis Ao  dissection , acute, surgery 03-2017 --Aortic thoracic aneurysm endovascular stent graft 03-2018 GU:  --BPH, microscopic hematuria (negative cystoscopy 1978) --Elevated PSA: saw urology  04-2016 Gynecomastia, Dr. Loanne Drilling  >> Bx (-) 10-2014 ; took tamoxifen temporarily (self d/c 06-2016), improved Hypogonadism-- dx per Dr Loanne Drilling, numbers improved d/t nolvadex  Sees dermatology : eczema? rx taclonex H/o  Tracheostomy , age 23, croup Chronic anemia: Chronic anemia per chart review,+ iron deficient (08-2018). S/p  colonoscopy 05-2016, multiple polyps. EGD 2014 for chest pain: Negative, bx results?   PLAN: HTN: Good ambulatory BPs, continue Lasix, potassium, metoprolol.  Check a CMP High cholesterol: Diet controlled, last LDL 100, recheck FLP, would like to see LDL in the 70s. Insomnia: On Xanax as needed COPD: Reports good compliance with inhalers.  Has occasional cough, patient believes triggered by postnasal dripping.  Treat allergies, see next. O2 sats when checked 93% at home, not on oxygen. Allergies: Recommend consistent use of Flonase, Astelin and Claritin.  Call if not better. Atrial fibrillation: Reports pulse is usually in the high 50s.  Continue metoprolol, flecainide and Eliquis.  Due to see cardiology, referral CHF?  See last visit, echocardiogram showed normal EF, currently LE edema well control w/ lasix and compression stockings Chronic anemia: Chronic anemia per chart review, last hemoglobin 10.9, iron deficient.  Currently on iron 1 tablet daily, unable to tolerate twice a day.  Last colonoscopy 05-2016, multiple polyps,EGD 2014 for chest pain: Negative, bx results? Plan: Continue iron, check a CBC, iron, ferritin, Y30, folic acid.  Consider GI eval Plan: Labs June 3 same day as his wife RTC here 3 to 4 months

## 2018-12-29 ENCOUNTER — Other Ambulatory Visit: Payer: Medicare Other

## 2018-12-29 ENCOUNTER — Other Ambulatory Visit: Payer: Self-pay

## 2018-12-29 DIAGNOSIS — I10 Essential (primary) hypertension: Secondary | ICD-10-CM

## 2018-12-29 DIAGNOSIS — E538 Deficiency of other specified B group vitamins: Secondary | ICD-10-CM

## 2018-12-29 DIAGNOSIS — D509 Iron deficiency anemia, unspecified: Secondary | ICD-10-CM

## 2018-12-29 DIAGNOSIS — I4891 Unspecified atrial fibrillation: Secondary | ICD-10-CM

## 2018-12-29 DIAGNOSIS — D649 Anemia, unspecified: Secondary | ICD-10-CM | POA: Insufficient documentation

## 2018-12-29 DIAGNOSIS — E611 Iron deficiency: Secondary | ICD-10-CM

## 2018-12-29 DIAGNOSIS — G4700 Insomnia, unspecified: Secondary | ICD-10-CM | POA: Insufficient documentation

## 2018-12-29 NOTE — Progress Notes (Deleted)
Sean Riley, it wont let me sight the orders with his Dx will you take a look at folate. Thanks. I pended the orders Dr. Larose Kells sent me in a staff message .

## 2018-12-29 NOTE — Assessment & Plan Note (Signed)
HTN: Good ambulatory BPs, continue Lasix, potassium, metoprolol.  Check a CMP High cholesterol: Diet controlled, last LDL 100, recheck FLP, would like to see LDL in the 70s. Insomnia: On Xanax as needed COPD: Reports good compliance with inhalers.  Has occasional cough, patient believes triggered by postnasal dripping.  Treat allergies, see next. O2 sats when checked 93% at home, not on oxygen. Allergies: Recommend consistent use of Flonase, Astelin and Claritin.  Call if not better. Atrial fibrillation: Reports pulse is usually in the high 50s.  Continue metoprolol, flecainide and Eliquis.  Due to see cardiology, referral CHF?  See last visit, echocardiogram showed normal EF, currently LE edema well control w/ lasix and compression stockings Chronic anemia: Chronic anemia per chart review, last hemoglobin 10.9, iron deficient.  Currently on iron 1 tablet daily, unable to tolerate twice a day.  Last colonoscopy 05-2016, multiple polyps,EGD 2014 for chest pain: Negative, bx results? Plan: Continue iron, check a CBC, iron, ferritin, D74, folic acid.  Consider GI eval Plan: Labs June 3 same day as his wife RTC here 3 to 4 months

## 2018-12-30 NOTE — Addendum Note (Signed)
Addended byDamita Dunnings D on: 12/30/2018 07:58 AM   Modules accepted: Orders

## 2019-01-11 ENCOUNTER — Other Ambulatory Visit: Payer: Medicare Other

## 2019-01-13 ENCOUNTER — Ambulatory Visit
Admission: RE | Admit: 2019-01-13 | Discharge: 2019-01-13 | Disposition: A | Discharge status: Home / Self Care | Payer: Medicare Other | Source: Ambulatory Visit | Attending: Surgery | Admitting: Surgery

## 2019-01-13 ENCOUNTER — Other Ambulatory Visit: Payer: Self-pay | Admitting: Internal Medicine

## 2019-01-13 DIAGNOSIS — Z48812 Encounter for surgical aftercare following surgery on the circulatory system: Secondary | ICD-10-CM | POA: Diagnosis not present

## 2019-01-13 DIAGNOSIS — I712 Thoracic aortic aneurysm, without rupture, unspecified: Secondary | ICD-10-CM

## 2019-01-13 MED ORDER — IOPAMIDOL (ISOVUE-370) INJECTION 76%
75.0000 mL | Freq: Once | INTRAVENOUS | Status: AC | PRN
  Administered 2019-01-13: 75 mL via INTRAVENOUS

## 2019-01-16 ENCOUNTER — Ambulatory Visit (INDEPENDENT_AMBULATORY_CARE_PROVIDER_SITE_OTHER): Payer: Medicare Other | Admitting: Surgery

## 2019-01-16 ENCOUNTER — Encounter: Payer: Self-pay | Admitting: Surgery

## 2019-01-16 ENCOUNTER — Other Ambulatory Visit: Payer: Self-pay

## 2019-01-16 VITALS — BP 149/73 | Temp 99.3°F | Ht 72.0 in | Wt 211.1 lb

## 2019-01-16 DIAGNOSIS — I712 Thoracic aortic aneurysm, without rupture, unspecified: Secondary | ICD-10-CM

## 2019-01-16 NOTE — Progress Notes (Signed)
Vascular and Vein Specialist of [facility]  Patient name: Sean Riley MRN: [id number] DOB: September 30, 1950 Sex: male   REASON FOR VISIT:    Follow up  HISOTRY OF PRESENT ILLNESS:     Sean Riley is a 68 y.o. 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  He underwent complete aortic arch replacement with elephant trunk as well as bioprosthetic tissue valve.  He had progressive enlargement of the descending thoracic aorta with persistent dissection and underwent endovascular repair on 03/30/2018.  His postoperative course was uncomplicated.   He does have a cough allergies.  His stamina is down.  He is complaining of leg swelling.  He has undergone bilateral great saphenous vein ablations by Dr. Kellie Simmering in 2013.  His small saphenous veins were competent  PAST MEDICAL HISTORY:   Past Medical History:  Diagnosis Date  . Allergy   . Arthritis   . 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 Atrial fibrillation ([place])   . Cataract    removed both eyes   . GERD (gastroesophageal reflux disease)   . HYPERLIPIDEMIA   . 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 Hypertension   . Microscopic hematuria    negative cystoscopy  . NONSPECIFIC ABNORMAL ELECTROCARDIOGRAM   . Pleural effusion on right   . Pleural effusion, bilateral   . S/P aortic dissection repair 03/24/2017   Biological Bentall aortic root replacement + resection and grafting of entire ascending aorta, transverse aortic arch and proximal descending thoracic aorta with elephant trunk distal anastomosis and debranching of aortic arch vessels  . 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 Varicose veins      FAMILY HISTORY:    Family History  Problem Relation Age of Onset  . Hypertension Mother   . Hypertension Father   . Benign prostatic hyperplasia Father        S/P TURP  . Heart attack Maternal Grandmother        MI in 60s  . Breast cancer Maternal Grandmother   . Arrhythmia Brother         X 2  . Heart attack Maternal Aunt        MI in 57s  . Stroke Neg Hx   . Diabetes Neg Hx   . Colon cancer Neg Hx   . Colon polyps Neg Hx   . Stomach cancer Neg Hx   . Rectal cancer Neg Hx   . Esophageal cancer Neg Hx   . Prostate cancer Neg Hx     SOCIAL HISTORY:   Social History   Tobacco Use  . Smoking status: Former Smoker    Packs/day: 2.00    Years: 45.00    Pack years: 90.00    Types: Cigarettes    Last attempt to quit: 03/22/2017    Years since quitting: 1.8  . Smokeless tobacco: Never Used  . Tobacco comment: 2 ppd , quit 03/2017  Substance Use Topics  . Alcohol use: Yes    Alcohol/week: 0.0 standard drinks    Comment: 5 cans of beer daily     ALLERGIES:   Allergies  Allergen Reactions  . Simvastatin Other (See Comments)    Mental status changes  . Celecoxib Other (See Comments)  GI UPSET AND INFLAMMATION  . Codeine Other (See Comments)    GI UPSET AND INFLAMMATION  . Nsaids Other (See Comments)    GI UPSET AND INFLAMMATION (can tolerate via IV)  . Tape Other (See Comments)    Medical tape and Band-Aids PULL OFF THE SKIN; please use Coban wrap  . Cephalexin Itching and Rash  . Latex Rash     CURRENT MEDICATIONS:   Current Outpatient Medications  Medication Sig Dispense Refill  . albuterol (PROAIR HFA) 108 (90 Base) MCG/ACT inhaler Inhale two puffs every four to six hours as needed for cough or wheeze. 3 Inhaler 3  . albuterol (PROVENTIL) (2.5 MG/3ML) 0.083% nebulizer solution Take 3 mLs (2.5 mg total) by nebulization every 8 (eight) hours as needed for wheezing or shortness of breath. 150 mL 1  . ALPRAZolam (XANAX) 1 MG tablet Take 1-1.5 tablets (1-1.5 mg total) by mouth at  bedtime. 40 tablet 1  . aspirin EC 81 MG tablet Take 1 tablet (81 mg total) by mouth daily.    Marland Kitchen augmented betamethasone dipropionate (DIPROLENE-AF) 0.05 % cream Apply 1 application topically 2 (two) times daily as needed (itchy skin.).    Marland Kitchen azelastine (ASTELIN) 0.1 % nasal spray Place 2 sprays into both nostrils 2 (two) times daily as needed for allergies. (Patient not taking: Reported on 12/28/2018)    . diphenhydrAMINE (BENADRYL) 25 MG tablet Take 25 mg by mouth every 8 (eight) hours as needed (allergies/runny nose).     Marland Kitchen ELIQUIS 5 MG TABS tablet TAKE 1 TABLET TWICE A DAY 180 tablet 1  . escitalopram (LEXAPRO) 10 MG tablet Take 1 tablet (10 mg total) by mouth daily. 90 tablet 3  . esomeprazole (NEXIUM) 40 MG capsule Take 1 capsule (40 mg total) by mouth daily before breakfast. 90 capsule 3  . Ferrous Sulfate (IRON) 325 (65 Fe) MG TABS Take 1 tablet (325 mg total) by mouth daily.  0  . flecainide (TAMBOCOR) 100 MG tablet Take 1 tablet (100 mg total) by mouth 2 (two) times daily. 180 tablet 3  . fluticasone (FLONASE) 50 MCG/ACT nasal spray Place 1 spray into both nostrils 2 (two) times daily as needed for allergies. Use one spray in each nostril twice daily. (Patient not taking: Reported on 12/28/2018)    . fluticasone furoate-vilanterol (BREO ELLIPTA) 200-25 MCG/INH AEPB Inhale 1 puff into the lungs at bedtime.    . furosemide (LASIX) 40 MG tablet Take 2 tablets (80 mg total) by mouth daily. 180 tablet 1  . metoprolol tartrate (LOPRESSOR) 25 MG tablet Take 1 tablet (25 mg total) by mouth 2 (two) times daily. 180 tablet 3  . potassium chloride (K-DUR) 10 MEQ tablet Take 1 tablet (10 mEq total) by mouth daily. 90 tablet 1  . umeclidinium bromide (INCRUSE ELLIPTA) 62.5 MCG/INH AEPB Inhale 1 puff into the lungs at bedtime.      No current facility-administered medications for this visit.     REVIEW OF SYSTEMS:   [X]  denotes positive finding, [ ]  denotes negative finding Cardiac  Comments:  Chest  pain or chest pressure:    Shortness of breath upon exertion: x   Short of breath when lying flat:    Irregular heart rhythm:        Vascular    Pain in calf, thigh, or hip brought on by ambulation:    Pain in feet at night that wakes you up from your sleep:     Blood clot in your veins:  Leg swelling:  x       Pulmonary    Oxygen at home:    Productive cough:     Wheezing:         Neurologic    Sudden weakness in arms or legs:     Sudden numbness in arms or legs:     Sudden onset of difficulty speaking or slurred speech:    Temporary loss of vision in one eye:     Problems with dizziness:         Gastrointestinal    Blood in stool:     Vomited blood:         Genitourinary    Burning when urinating:     Blood in urine:        Psychiatric    Major depression:         Hematologic    Bleeding problems:    Problems with blood clotting too easily:        Skin    Rashes or ulcers:        Constitutional    Fever or chills:      PHYSICAL EXAM:   There were no vitals filed for this visit.  GENERAL: The patient is a well-nourished male, in no acute distress. The vital signs are documented above. CARDIAC: There is a regular rate and rhythm.  VASCULAR: Bilateral lower extremity edema PULMONARY: Non-labored respirations ABDOMEN: Soft and non-tender with normal pitched bowel sounds.  MUSCULOSKELETAL: There are no major deformities or cyanosis. NEUROLOGIC: No focal weakness or paresthesias are detected. SKIN: There are no ulcers or rashes noted. PSYCHIATRIC: The patient has a normal affect.  STUDIES:   I have reviewed the CTA with the following findings:  1. Stable appearance of surgically repair of type A dissection including prosthetic aortic valve, aortic root replacement, arch replacement and elephant graft with debranching of great vessels. 2. Aortic endograft shows stable patency and positioning with no evidence of proximal endoleak. Surrounding proximal  and mid thoracic aortic aneurysm sac remain thrombosed without evidence of endoleak. Diameter of the aneurysm sac is stable to slightly smaller along the proximal and mid descending thoracic aorta. Distal descending thoracic aorta demonstrates stable patency and again demonstrates visualization of partial perfusion of the false lumen due to persistent dissection. 3. Abdominal aortic dissection shows stable morphology and no evidence of abdominal aortic enlargement. Maximal diameter of the supraceliac abdominal aorta is 4.3 cm.  MEDICAL ISSUES:   Patient remained stable.  He will need a CT angiogram, dissection protocol of the chest abdomen pelvis in 1 year in order to evaluate his thoracic aorta as well as the suprarenal aorta which has a maximum diameter of 4.3 cm.    Leia Alf, MD, FACS [facility] and Vein Specialists of [facility] [phone] Pager [phone]

## 2019-01-18 ENCOUNTER — Other Ambulatory Visit: Payer: Self-pay

## 2019-01-18 ENCOUNTER — Other Ambulatory Visit (INDEPENDENT_AMBULATORY_CARE_PROVIDER_SITE_OTHER): Payer: Medicare Other

## 2019-01-18 ENCOUNTER — Telehealth: Payer: Self-pay | Admitting: Internal Medicine

## 2019-01-18 DIAGNOSIS — E611 Iron deficiency: Secondary | ICD-10-CM

## 2019-01-18 DIAGNOSIS — D649 Anemia, unspecified: Secondary | ICD-10-CM

## 2019-01-18 DIAGNOSIS — D509 Iron deficiency anemia, unspecified: Secondary | ICD-10-CM

## 2019-01-18 DIAGNOSIS — I10 Essential (primary) hypertension: Secondary | ICD-10-CM

## 2019-01-18 DIAGNOSIS — E871 Hypo-osmolality and hyponatremia: Secondary | ICD-10-CM

## 2019-01-18 DIAGNOSIS — E538 Deficiency of other specified B group vitamins: Secondary | ICD-10-CM

## 2019-01-18 LAB — COMPREHENSIVE METABOLIC PANEL
ALT: 10 U/L (ref 0–53)
AST: 15 U/L (ref 0–37)
Albumin: 4 g/dL (ref 3.5–5.2)
Alkaline Phosphatase: 104 U/L (ref 39–117)
BUN: 10 mg/dL (ref 6–23)
CO2: 29 mEq/L (ref 19–32)
Calcium: 8.9 mg/dL (ref 8.4–10.5)
Chloride: 88 mEq/L — ABNORMAL LOW (ref 96–112)
Creatinine, Ser: 1.02 mg/dL (ref 0.40–1.50)
GFR: 72.38 mL/min (ref >60.00)
Glucose, Bld: 78 mg/dL (ref 70–99)
Potassium: 3.9 mEq/L (ref 3.5–5.1)
Sodium: 125 mEq/L — ABNORMAL LOW (ref 135–145)
Total Bilirubin: 1.2 mg/dL (ref 0.2–1.2)
Total Protein: 6.9 g/dL (ref 6.0–8.3)

## 2019-01-18 LAB — CBC WITH DIFFERENTIAL/PLATELET
Basophils Absolute: 0 10*3/uL (ref 0.0–0.1)
Basophils Relative: 0.5 % (ref 0.0–3.0)
Eosinophils Absolute: 0.3 10*3/uL (ref 0.0–0.7)
Eosinophils Relative: 4.2 % (ref 0.0–5.0)
HCT: 43.3 % (ref 39.0–52.0)
Hemoglobin: 15 g/dL (ref 13.0–17.0)
Lymphocytes Relative: 10.1 % — ABNORMAL LOW (ref 12.0–46.0)
Lymphs Abs: 0.6 10*3/uL — ABNORMAL LOW (ref 0.7–4.0)
MCHC: 34.7 g/dL (ref 30.0–36.0)
MCV: 99.7 fl (ref 78.0–100.0)
Monocytes Absolute: 0.7 10*3/uL (ref 0.1–1.0)
Monocytes Relative: 11.5 % (ref 3.0–12.0)
Neutro Abs: 4.5 10*3/uL (ref 1.4–7.7)
Neutrophils Relative %: 73.7 % (ref 43.0–77.0)
Platelets: 241 10*3/uL (ref 150.0–400.0)
RBC: 4.35 Mil/uL (ref 4.22–5.81)
RDW: 13 % (ref 11.5–15.5)
WBC: 6 10*3/uL (ref 4.0–10.5)

## 2019-01-18 LAB — LIPID PANEL
Cholesterol: 182 mg/dL (ref 0–200)
HDL: 41.6 mg/dL (ref >39.00)
LDL Cholesterol: 119 mg/dL — ABNORMAL HIGH (ref 0–99)
NonHDL: 140.3
Total CHOL/HDL Ratio: 4
Triglycerides: 109 mg/dL (ref 0.0–149.0)
VLDL: 21.8 mg/dL (ref 0.0–40.0)

## 2019-01-18 LAB — VITAMIN B12: Vitamin B-12: 282 pg/mL (ref 211–911)

## 2019-01-18 LAB — FERRITIN: Ferritin: 88 ng/mL (ref 22.0–322.0)

## 2019-01-18 LAB — FOLATE: Folate: 12.5 ng/mL (ref >5.9)

## 2019-01-18 LAB — IRON: Iron: 95 ug/dL (ref 42–165)

## 2019-01-18 NOTE — Telephone Encounter (Signed)
Cholesterol mildly elevated (used to be on Crestor without apparent problems), sodium 125, potassium 3.9, normal kidney function.  Other labs okay Plan: Restart Crestor at a  low-dose, 5 mg daily.  Decrease Lasix 40 mg from  B.I.D. to 1 tablet daily.    Continue KCl 10 mEq Decrease free fluid intake Watch for swelling, weight gain or shortness of breath Left a detailed message for Sean Riley, patient's wife. Sean Riley, arrange for: prescription for Crestor milligrams 1 tablet daily #30 and 3 refills BMP in 10 days, hyponatremia

## 2019-01-19 MED ORDER — ROSUVASTATIN CALCIUM 5 MG PO TABS: 5.0000 mg | ORAL_TABLET | Freq: Every day | ORAL | 3 refills | Status: DC

## 2019-01-19 NOTE — Telephone Encounter (Signed)
Crestor 5mg  sent to pharmacy. BMP ordered. Sherri- can you reach out to Pt's wife [name] and schedule lab appt to be done in 10 days. Thank you.

## 2019-01-19 NOTE — Telephone Encounter (Signed)
Got it.. Done

## 2019-01-19 NOTE — Telephone Encounter (Signed)
Spoke with Tommi Rumps, the patient's wife, she understood my instructions very well. Additionally, he drinks beer, recommend to decrease intake to at most 1 serving daily. He is having some cough from postnasal dripping, she thinks is seasonal allergies.  No fever or chills. In addition to Claritin and Flonase he needs to restart Astelin. If the cough is not better they will call next week and set up an appointment in person.

## 2019-01-30 ENCOUNTER — Other Ambulatory Visit (INDEPENDENT_AMBULATORY_CARE_PROVIDER_SITE_OTHER): Payer: Medicare Other

## 2019-01-30 ENCOUNTER — Other Ambulatory Visit: Payer: Self-pay

## 2019-01-30 DIAGNOSIS — E871 Hypo-osmolality and hyponatremia: Secondary | ICD-10-CM | POA: Diagnosis not present

## 2019-01-30 LAB — BASIC METABOLIC PANEL
BUN: 9 mg/dL (ref 6–23)
CO2: 28 mEq/L (ref 19–32)
Calcium: 8.8 mg/dL (ref 8.4–10.5)
Chloride: 91 mEq/L — ABNORMAL LOW (ref 96–112)
Creatinine, Ser: 0.97 mg/dL (ref 0.40–1.50)
GFR: 76.7 mL/min (ref >60.00)
Glucose, Bld: 83 mg/dL (ref 70–99)
Potassium: 3.9 mEq/L (ref 3.5–5.1)
Sodium: 128 mEq/L — ABNORMAL LOW (ref 135–145)

## 2019-02-01 ENCOUNTER — Telehealth: Payer: Self-pay | Admitting: Internal Medicine

## 2019-02-01 NOTE — Telephone Encounter (Signed)
Called PT left msg to make app in one month face to  Face

## 2019-02-07 ENCOUNTER — Other Ambulatory Visit: Payer: Self-pay | Admitting: *Deleted

## 2019-02-07 DIAGNOSIS — Z87891 Personal history of nicotine dependence: Secondary | ICD-10-CM

## 2019-02-07 DIAGNOSIS — Z122 Encounter for screening for malignant neoplasm of respiratory organs: Secondary | ICD-10-CM

## 2019-02-07 DIAGNOSIS — F17210 Nicotine dependence, cigarettes, uncomplicated: Secondary | ICD-10-CM

## 2019-02-07 NOTE — Progress Notes (Signed)
Chest  

## 2019-03-02 ENCOUNTER — Ambulatory Visit: Payer: Medicare Other | Admitting: Internal Medicine

## 2019-03-30 ENCOUNTER — Ambulatory Visit (INDEPENDENT_AMBULATORY_CARE_PROVIDER_SITE_OTHER): Payer: Medicare Other | Admitting: Internal Medicine

## 2019-03-30 ENCOUNTER — Other Ambulatory Visit: Payer: Self-pay

## 2019-03-30 ENCOUNTER — Ambulatory Visit (HOSPITAL_BASED_OUTPATIENT_CLINIC_OR_DEPARTMENT_OTHER)
Admission: RE | Admit: 2019-03-30 | Discharge: 2019-03-30 | Disposition: A | Discharge status: Home / Self Care | Payer: Medicare Other | Source: Ambulatory Visit | Attending: Internal Medicine | Admitting: Internal Medicine

## 2019-03-30 ENCOUNTER — Encounter: Payer: Self-pay | Admitting: Internal Medicine

## 2019-03-30 VITALS — BP 163/61 | HR 60 | Temp 98.3°F | Resp 18 | Ht 72.0 in | Wt 216.4 lb

## 2019-03-30 DIAGNOSIS — J449 Chronic obstructive pulmonary disease, unspecified: Secondary | ICD-10-CM | POA: Diagnosis not present

## 2019-03-30 DIAGNOSIS — I5033 Acute on chronic diastolic (congestive) heart failure: Secondary | ICD-10-CM | POA: Insufficient documentation

## 2019-03-30 DIAGNOSIS — E785 Hyperlipidemia, unspecified: Secondary | ICD-10-CM

## 2019-03-30 DIAGNOSIS — I480 Paroxysmal atrial fibrillation: Secondary | ICD-10-CM | POA: Diagnosis not present

## 2019-03-30 DIAGNOSIS — R0602 Shortness of breath: Secondary | ICD-10-CM | POA: Diagnosis not present

## 2019-03-30 LAB — CBC WITH DIFFERENTIAL/PLATELET
Basophils Absolute: 0 10*3/uL (ref 0.0–0.1)
Basophils Relative: 0.6 % (ref 0.0–3.0)
Eosinophils Absolute: 0.3 10*3/uL (ref 0.0–0.7)
Eosinophils Relative: 4.1 % (ref 0.0–5.0)
HCT: 41.1 % (ref 39.0–52.0)
Hemoglobin: 14.3 g/dL (ref 13.0–17.0)
Lymphocytes Relative: 8.9 % — ABNORMAL LOW (ref 12.0–46.0)
Lymphs Abs: 0.6 10*3/uL — ABNORMAL LOW (ref 0.7–4.0)
MCHC: 34.7 g/dL (ref 30.0–36.0)
MCV: 97.6 fl (ref 78.0–100.0)
Monocytes Absolute: 0.8 10*3/uL (ref 0.1–1.0)
Monocytes Relative: 11.4 % (ref 3.0–12.0)
Neutro Abs: 4.9 10*3/uL (ref 1.4–7.7)
Neutrophils Relative %: 75 % (ref 43.0–77.0)
Platelets: 238 10*3/uL (ref 150.0–400.0)
RBC: 4.21 Mil/uL — ABNORMAL LOW (ref 4.22–5.81)
RDW: 13.2 % (ref 11.5–15.5)
WBC: 6.6 10*3/uL (ref 4.0–10.5)

## 2019-03-30 LAB — LIPID PANEL
Cholesterol: 140 mg/dL (ref 0–200)
HDL: 46 mg/dL (ref >39.00)
LDL Cholesterol: 74 mg/dL (ref 0–99)
NonHDL: 93.55
Total CHOL/HDL Ratio: 3
Triglycerides: 97 mg/dL (ref 0.0–149.0)
VLDL: 19.4 mg/dL (ref 0.0–40.0)

## 2019-03-30 LAB — COMPREHENSIVE METABOLIC PANEL
ALT: 9 U/L (ref 0–53)
AST: 13 U/L (ref 0–37)
Albumin: 4.4 g/dL (ref 3.5–5.2)
Alkaline Phosphatase: 104 U/L (ref 39–117)
BUN: 10 mg/dL (ref 6–23)
CO2: 28 mEq/L (ref 19–32)
Calcium: 9.2 mg/dL (ref 8.4–10.5)
Chloride: 90 mEq/L — ABNORMAL LOW (ref 96–112)
Creatinine, Ser: 1.02 mg/dL (ref 0.40–1.50)
GFR: 72.34 mL/min (ref >60.00)
Glucose, Bld: 84 mg/dL (ref 70–99)
Potassium: 4.4 mEq/L (ref 3.5–5.1)
Sodium: 126 mEq/L — ABNORMAL LOW (ref 135–145)
Total Bilirubin: 1.1 mg/dL (ref 0.2–1.2)
Total Protein: 7.2 g/dL (ref 6.0–8.3)

## 2019-03-30 MED ORDER — FUROSEMIDE 40 MG PO TABS: 80.0000 mg | ORAL_TABLET | Freq: Every day | ORAL | 1 refills | Status: DC

## 2019-03-30 MED ORDER — ROSUVASTATIN CALCIUM 5 MG PO TABS: 5.0000 mg | ORAL_TABLET | Freq: Every day | ORAL | 3 refills | Status: DC

## 2019-03-30 MED ORDER — BETAMETHASONE DIPROPIONATE AUG 0.05 % EX CREA: 1.0000 "application " | TOPICAL_CREAM | Freq: Two times a day (BID) | CUTANEOUS | 1 refills | Status: DC | PRN

## 2019-03-30 MED ORDER — ALBUTEROL SULFATE HFA 108 (90 BASE) MCG/ACT IN AERS: 2.0000 | INHALATION_SPRAY | RESPIRATORY_TRACT | 3 refills | Status: DC | PRN

## 2019-03-30 NOTE — Progress Notes (Signed)
Subjective:    Patient ID: Sean Riley, male    DOB: 1951-04-07, 68 y.o.   MRN: [id number]  DOS:  03/30/2019 Type of visit - description: Routine checkup, here with his wife For the last 3 weeks, they wife have not it increased swelling on chest congestion. His Lasix dose was decreased 2 months ago due to hyponatremia.  EtOH: Still drinks "2-3 beers a night".  Eczema: Requests refill on Diprolene  Restarted Crestor recently without apparent problems.   Wt Readings from Last 3 Encounters:  03/30/19 216 lb 6 oz (98.1 kg)  01/16/19 211 lb 1.6 oz (95.8 kg)  09/22/18 201 lb 6 oz (91.3 kg)     Review of Systems He strongly denies any fever or chills, admits to some runny nose and congestion No chest pain. + DOE, moderate to severe, increased lately. Orthopnea?  He sleeps sitting up for the last few years and is not worse. No nausea, vomiting, diarrhea Cough is at baseline, + clear sputum.  Past Medical History:  Diagnosis Date  . Allergy   . Arthritis   . 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 Atrial fibrillation ([place])   . Cataract    removed both eyes   . GERD (gastroesophageal reflux disease)   . HYPERLIPIDEMIA   . 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 Hypertension   . Microscopic hematuria    negative cystoscopy  . NONSPECIFIC ABNORMAL ELECTROCARDIOGRAM   . Pleural effusion on right   . Pleural effusion, bilateral   . S/P aortic dissection repair 03/24/2017   Biological Bentall aortic root replacement + resection and grafting of entire ascending aorta, transverse aortic arch and proximal descending thoracic aorta with elephant trunk distal anastomosis and debranching of aortic arch vessels  . 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 Varicose  veins     Past Surgical History:  Procedure Laterality Date  . APPENDECTOMY    . CARDIAC CATHETERIZATION      X3;Dr Caryl Comes, last 01/20/12: mild non-obstructive CAD, normal LV systolic function  . CARDIOVERSION N/A 05/31/2018   Procedure: CARDIOVERSION;  Surgeon: Sueanne Margarita, MD;  Location: [facility] ENDOSCOPY;  Service: Cardiovascular;  Laterality: N/A;  . COLONOSCOPY    . CYSTOSCOPY  1978   Dr Hartley Barefoot  . EYE SURGERY    . IR THORACENTESIS ASP PLEURAL SPACE W/IMG GUIDE  11/25/2017  . KNEE ARTHROSCOPY  2012   Dr Theda Sers  . LUMBAR LAMINECTOMY  1987   Dr Durward Fortes  . MYELOGRAM  2007  . PERICARDIOCENTESIS N/A 04/07/2017   Procedure: PERICARDIOCENTESIS;  Surgeon: Sherren Mocha, MD;  Location: [facility] LAB;  Service: Cardiovascular;  Laterality: N/A;  . POLYPECTOMY    . REPAIR OF ACUTE ASCENDING THORACIC AORTIC DISSECTION N/A 03/23/2017   Procedure: REPAIR OF ACUTE ASCENDING THORACIC AORTIC DISSECTION.  Bentall procedure.  Aortic root repleacement with bioprosthetic valve.  Reimplantation of left and right coronary arteries.  Total resection of transverse aortic arch.  Elephant trunk distal anastomosis and debranching of arch vessels.;  Surgeon: Rexene Alberts, MD;  Location: [facility];  Service: Vascular;  Laterality: N/A;  . ROTATOR CUFF REPAIR    . SHOULDER ARTHROSCOPY  2011   Dr Theda Sers  . SHOULDER ARTHROSCOPY Right 08/2015  . [place]  Dr Rolin Barry  . TEE WITHOUT CARDIOVERSION N/A 03/23/2017   Procedure: TRANSESOPHAGEAL ECHOCARDIOGRAM (TEE);  Surgeon: Rexene Alberts, MD;  Location: [place];  Service: Open Heart Surgery;  Laterality: N/A;  . THORACIC AORTIC ENDOVASCULAR STENT GRAFT N/A 03/30/2018   Procedure: THORACIC AORTIC ENDOVASCULAR STENT GRAFT WITH INTRAVASCULAR ULTRASOUND;  Surgeon: Serafina Mitchell, MD;  Location: [facility] OR;  Service: Vascular;  Laterality: N/A;  . TRACHEOSTOMY     age 35 for croup    Social History   Socioeconomic History  . Marital status: Married     Spouse name: Not on file  . Number of children: 2  . Years of education: Not on file  . Highest education level: Not on file  Occupational History  . 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 Financial resource strain: Not on file  . Food insecurity    Worry: Not on file    Inability: Not on file  . Transportation needs    Medical: Not on file    Non-medical: Not on file  Tobacco Use  . Smoking status: Former Smoker    Packs/day: 2.00    Years: 45.00    Pack years: 90.00    Types: Cigarettes    Quit date: 03/22/2017    Years since quitting: 2.0  . Smokeless tobacco: Never Used  . Tobacco comment: 2 ppd , quit 03/2017  Substance and Sexual Activity  . Alcohol use: Yes    Alcohol/week: 0.0 standard drinks    Comment: 5 cans of beer daily  . Drug use: No  . Sexual activity: Never  Lifestyle  . Physical activity    Days per week: Not on file    Minutes per session: Not on file  . Stress: Not on file  Relationships  . Social Herbalist on phone: Not on file    Gets together: Not on file    Attends religious service: Not on file    Active member of club or organization: Not on file    Attends meetings of clubs or organizations: Not on file    Relationship status: Not on file  . Intimate partner violence    Fear of current or ex partner: Not on file    Emotionally abused: Not on file    Physically abused: Not on file    Forced sexual activity: Not on file  Other Topics Concern  . Not on file  Social History Narrative   Lives w/ wife      Allergies as of 03/30/2019      Reactions   Simvastatin Other (See Comments)   Mental status changes   Celecoxib Other (See Comments)   GI UPSET AND INFLAMMATION   Codeine Other (See Comments)   GI UPSET AND INFLAMMATION   Nsaids Other (See Comments)   GI UPSET AND INFLAMMATION (can tolerate via IV)   Tape Other (See Comments)   Medical tape and Band-Aids PULL OFF THE SKIN; please use Coban wrap    Cephalexin Itching, Rash   Latex Rash      Medication List       Accurate as of March 30, 2019 11:59 PM. If you have any questions, ask your nurse or doctor.        albuterol (2.5 MG/3ML) 0.083% nebulizer solution Commonly known as: PROVENTIL Take 3 mLs (2.5 mg total) by nebulization every 8 (eight) hours as needed for wheezing or shortness of breath. What changed: Another medication with  the same name was changed. Make sure you understand how and when to take each. Changed by: Kathlene November, MD   albuterol 108 (90 Base) MCG/ACT inhaler Commonly known as: ProAir HFA Inhale 2 puffs into the lungs every 4 (four) hours as needed for wheezing or shortness of breath. What changed:   how much to take  how to take this  when to take this  reasons to take this  additional instructions Changed by: Kathlene November, MD   ALPRAZolam 1 MG tablet Commonly known as: XANAX Take 1-1.5 tablets (1-1.5 mg total) by mouth at bedtime.   aspirin EC 81 MG tablet Take 1 tablet (81 mg total) by mouth daily.   augmented betamethasone dipropionate 0.05 % cream Commonly known as: DIPROLENE-AF Apply 1 application topically 2 (two) times daily as needed (itchy skin.).   azelastine 0.1 % nasal spray Commonly known as: ASTELIN Place 2 sprays into both nostrils 2 (two) times daily as needed for allergies.   diphenhydrAMINE 25 MG tablet Commonly known as: BENADRYL Take 25 mg by mouth every 8 (eight) hours as needed (allergies/runny nose).   Eliquis 5 MG Tabs tablet Generic drug: apixaban TAKE 1 TABLET TWICE A DAY   escitalopram 10 MG tablet Commonly known as: LEXAPRO Take 1 tablet (10 mg total) by mouth daily.   esomeprazole 40 MG capsule Commonly known as: NexIUM Take 1 capsule (40 mg total) by mouth daily before breakfast.   flecainide 100 MG tablet Commonly known as: TAMBOCOR Take 1 tablet (100 mg total) by mouth 2 (two) times daily.   fluticasone 50 MCG/ACT nasal spray Commonly known as:  FLONASE Place 1 spray into both nostrils 2 (two) times daily as needed for allergies. Use one spray in each nostril twice daily.   fluticasone furoate-vilanterol 200-25 MCG/INH Aepb Commonly known as: Breo Ellipta Inhale 1 puff into the lungs at bedtime.   furosemide 40 MG tablet Commonly known as: LASIX Take 2 tablets (80 mg total) by mouth daily. What changed: how much to take Changed by: Kathlene November, MD   Incruse Ellipta 62.5 MCG/INH Aepb Generic drug: umeclidinium bromide Inhale 1 puff into the lungs at bedtime.   Iron 325 (65 Fe) MG Tabs Take 1 tablet (325 mg total) by mouth daily.   metoprolol tartrate 25 MG tablet Commonly known as: LOPRESSOR Take 1 tablet (25 mg total) by mouth 2 (two) times daily.   potassium chloride 10 MEQ tablet Commonly known as: K-DUR Take 1 tablet (10 mEq total) by mouth daily.   rosuvastatin 5 MG tablet Commonly known as: Crestor Take 1 tablet (5 mg total) by mouth daily.           Objective:   Physical Exam BP (!) 163/61 (BP Location: Left Arm, Patient Position: Sitting, Cuff Size: Normal)   Pulse 60   Temp 98.3 F (36.8 C) (Temporal)   Resp 18   Ht 6' (1.829 m)   Wt 216 lb 6 oz (98.1 kg)   SpO2 97%   BMI 29.35 kg/m  General:   Well developed, NAD, BMI noted.  HEENT:  Normocephalic . Face symmetric, atraumatic Neck: Slight increase JVD at 45 degrees Lungs:  Rhonchi bilaterally, clear with cough, + end expiratory wheezing.  Decreased breath sounds at bases.  O2 sat noted, normal on RA. Normal respiratory effort, no intercostal retractions, no accessory muscle use. Heart: RRR, systolic murmur.  +/+++ Pretibial edema, bilateral, more noticeable around the ankles. Abdomen:  Not distended, soft, non-tender. No rebound or rigidity.  Skin: Multiple patches of macular redness, some of them with dry and scaly skin. Neurologic:  alert & oriented X3.  Speech normal, gait assisted by a cane. Psych--  Cognition and judgment appear  intact.  Cooperative with normal attention span and concentration.  Behavior appropriate. No anxious or depressed appearing.     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 Tried a cpap. [place] (-) 07-2015 CV: --P- Atrial fibrillation Dr Caryl Comes, on NSRb --lexiscan 2013 --Cardiac catheterization July 2013: Normal LV FX,mild nonobstructive CAD Dr. Angelena Form --thoracis Ao  dissection , acute, surgery 03-2017 --Aortic thoracic aneurysm endovascular stent graft 03-2018 GU:  --BPH, microscopic hematuria (negative cystoscopy 1978) --Elevated PSA: saw urology  04-2016 Gynecomastia, Dr. Loanne Drilling  >> Bx (-) 10-2014 ; took tamoxifen temporarily (self d/c 06-2016), improved Hypogonadism-- dx per Dr Loanne Drilling, numbers improved d/t nolvadex  Sees dermatology : eczema? rx taclonex H/o  Tracheostomy , age 88, croup Chronic anemia: Chronic anemia per chart review,+ iron deficient (08-2018). S/p  colonoscopy 05-2016, multiple polyps. EGD 2014 for chest pain: Negative, bx results?   PLAN: CHF: For the last 3 weeks, edema has been worse, wife reports "chest congestion".  On exam, decreased breath sounds at bases.  Likely on CHF, in the context of decreasing Lasix 2 months ago due to hyponatremia. Currently on Lasix 40 mg daily, metoprolol and potassium. Last echocardiogram 09/23/2018: EF 67%, likely diastolic HF Plan:  CMP, CBC, chest x-ray. Increase Lasix 40 mg to 2 in the morning and 1 in the afternoon for 3 days, then 2 in the morning. Refer to cardiology, recommend to be seen next week.  Likely will need further med adjustment and a follow-up BMP ER if symptoms worsen. Atrial fibrillation: (our machine is down, unable to obtain a EKG ).  He is anticoagulated, heart  rate in the 60s. COPD: Increase "chest congestion" could be also COPD exacerbation however clinically the picture is more of a CHF.  Continue present care for COPD. High cholesterol: Restarted Crestor 2 months ago, checking FLP Eczema: Refill betamethasone  RTC 4 weeks

## 2019-03-30 NOTE — Patient Instructions (Addendum)
Please schedule Medicare Wellness with Glenard Haring.    GO TO THE LAB : Get the blood work     GO TO THE FRONT DESK Schedule your next appointment   for a checkup in 4 weeks  Get a chest x-ray on the first floor   Lasix 40 mg: Take 2 tablets in the morning and 1 tablet in the afternoon for the next 3 days After that, take 2 tablets every morning  Weigh yourself every day keep a log    Check the  blood pressure   daily BP GOAL is between 110/65 and  135/85. If it is consistently higher or lower, let me know  We are referring you to the heart doctor

## 2019-03-30 NOTE — Progress Notes (Signed)
Pre visit review using our clinic review tool, if applicable. No additional management support is needed unless otherwise documented below in the visit note. 

## 2019-04-01 NOTE — Assessment & Plan Note (Signed)
  CHF: For the last 3 weeks, edema has been worse, wife reports "chest congestion".  On exam, decreased breath sounds at bases.  Likely on CHF, in the context of decreasing Lasix 2 months ago due to hyponatremia. Currently on Lasix 40 mg daily, metoprolol and potassium. Last echocardiogram 09/23/2018: EF [id number], likely diastolic HF Plan:  CMP, CBC, chest x-ray. Increase Lasix 40 mg to 2 in the morning and 1 in the afternoon for 3 days, then 2 in the morning. Refer to cardiology, recommend to be seen next week.  Likely will need further med adjustment and a follow-up BMP ER if symptoms worsen. Atrial fibrillation: (our machine is down, unable to obtain a EKG ).  He is anticoagulated, heart rate in the 60s. COPD: Increase "chest congestion" could be also COPD exacerbation however clinically the picture is more of a CHF.  Continue present care for COPD. High cholesterol: Restarted Crestor 2 months ago, checking FLP Eczema: Refill betamethasone  RTC 4 weeks

## 2019-04-04 ENCOUNTER — Other Ambulatory Visit: Payer: Self-pay | Admitting: Internal Medicine

## 2019-04-05 ENCOUNTER — Telehealth: Payer: Self-pay | Admitting: Internal Medicine

## 2019-04-05 NOTE — Telephone Encounter (Signed)
Gwen , could you please call cardiology and see if he can be seen in the next few days, DX CHF.

## 2019-04-05 NOTE — Telephone Encounter (Signed)
Called to check on patient He has lost approximately 6 pounds since the last office visit when we increased Lasix.  Will stay now on Lasix 40 mg  2 tablets in AM He is using compression stocking to control his LE swelling. "Chest congestion" has decreased. Labs and x-ray were satisfactory except for hyponatremia He has an appointment with me next month however still needs to see cardiology, will  follow-up on the referral

## 2019-04-05 NOTE — Telephone Encounter (Signed)
Prescription refill request for Eliquis received.  Last office visit: Sean Riley (07-27-2018) Scr: 1.02 (03-30-2019) Age: 68 y.o. Weight: 98.1kg   Prescription refill sent.

## 2019-04-06 ENCOUNTER — Other Ambulatory Visit: Payer: Self-pay

## 2019-04-06 DIAGNOSIS — R600 Localized edema: Secondary | ICD-10-CM

## 2019-04-06 MED ORDER — POTASSIUM CHLORIDE ER 10 MEQ PO TBCR: 10.0000 meq | EXTENDED_RELEASE_TABLET | Freq: Every day | ORAL | 1 refills | Status: DC

## 2019-04-06 NOTE — Telephone Encounter (Signed)
Pts wife called stating pt is willing to see another cardiologist if he can be seen sooner.

## 2019-04-10 NOTE — Progress Notes (Signed)
Cardiology Office Note Date:  04/10/2019  Patient ID:  Sean Riley 1951-03-15, MRN [id number] PCP:  Colon Branch, MD  Electrophysiologist; Dr. Caryl Comes  Chief Complaint: post hospital visit, d/c 04/09/17  History of Present Illness: Sean Riley is a 68 y.o. 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) w/adult lifelong heavy smoking, and AFib maintained on flecainide and diltiazem 03/24/17  Emergency Repair of Aortic Dissection              Right axillary artery cannulation             Deep hypothermia and total circulatory arrest with continuous antegrade cerebral perfusion             Resection and grafting of ascending aorta, transverse aortic arch and proximal descending thoracic aorta             Elephant trunk distal anastomosis             Debranching of aortic arch vessles            Biological Bentall Aortic Root Replacement             Edwards Magna Ease Pericardial Tissue Valve (size 25 mm, model # 3300TFX, serial # [id number])             Gelweave Valsalva Aortic Root Graft (size 28 mm, ref # [id number], serial # [id number])             Reimplantation of Left Main and Right Coronary Arteries  He had DDCV Oct 2019.  He comes in today to be seen for Dr. Caryl Comes.  Last seen by him in Dec 2019, no changes were made at that visit, noting hx of difficulty with volume OL, suspected a component of dietary indiscretions.  He is accompanied by his wife.  He denies any CP, palpitations, no dizzy spells, no near syncope or syncope.  He never feels like he breaths well.  This dates back to the time of his dissection, waxes/wanes but feels like generally is easily winded.  No rest SOB.   His PMD saw him 8/20, suspected combined issue with his COPD, though perhaps more fluid OL.  He noted history of hyponatremia, cautiously increased his lasix. With plans to see him here early, and has f/u with him as well in a couple weeks.  He is wearing support stockings with  some improvement in his edema as well, he has noted in the AM his swelling is generally better then it is at the end of the day.  His wife has been monitoring his weights have drifted down about 3 lbs, not going back up yet, seems to have landed at 210lbs by his home scale the last few days.  She is getting SBP pretty consistently in the 150's  Past Medical History:  Diagnosis Date  . Allergy   . Arthritis   . Ascending aortic dissection ([place]) 03/23/2017  . Atrial fibrillation ([place])   . Cataract    removed both eyes   . GERD (gastroesophageal reflux disease)   . HYPERLIPIDEMIA   . 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 Hypertension   . Microscopic hematuria    negative cystoscopy  . NONSPECIFIC ABNORMAL ELECTROCARDIOGRAM   . Pleural effusion on right   . Pleural effusion, bilateral   . S/P aortic dissection repair 03/24/2017   Biological Bentall aortic root replacement + resection and grafting of entire ascending  aorta, transverse aortic arch and proximal descending thoracic aorta with elephant trunk distal anastomosis and debranching of aortic arch vessels  . 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 Varicose veins     Past Surgical History:  Procedure Laterality Date  . APPENDECTOMY    . CARDIAC CATHETERIZATION      X3;Dr Caryl Comes, last 01/20/12: mild non-obstructive CAD, normal LV systolic function  . CARDIOVERSION N/A 05/31/2018   Procedure: CARDIOVERSION;  Surgeon: Sueanne Margarita, MD;  Location: [facility] ENDOSCOPY;  Service: Cardiovascular;  Laterality: N/A;  . COLONOSCOPY    . CYSTOSCOPY  1978   Dr Hartley Barefoot  . EYE SURGERY    . IR THORACENTESIS ASP PLEURAL SPACE W/IMG GUIDE  11/25/2017  . KNEE ARTHROSCOPY  2012   Dr Theda Sers  . LUMBAR LAMINECTOMY  1987   Dr Durward Fortes  .  MYELOGRAM  2007  . PERICARDIOCENTESIS N/A 04/07/2017   Procedure: PERICARDIOCENTESIS;  Surgeon: Sherren Mocha, MD;  Location: [place] [facility];  Service: Cardiovascular;  Laterality: N/A;  . POLYPECTOMY    . REPAIR OF ACUTE ASCENDING THORACIC AORTIC DISSECTION N/A 03/23/2017   Procedure: REPAIR OF ACUTE ASCENDING THORACIC AORTIC DISSECTION.  Bentall procedure.  Aortic root repleacement with bioprosthetic valve.  Reimplantation of left and right coronary arteries.  Total resection of transverse aortic arch.  Elephant trunk distal anastomosis and debranching of arch vessels.;  Surgeon: Rexene Alberts, MD;  Location: [place];  Service: Vascular;  Laterality: N/A;  . ROTATOR CUFF REPAIR    . SHOULDER ARTHROSCOPY  2011   Dr Theda Sers  . SHOULDER ARTHROSCOPY Right 08/2015  . SPINAL FUSION  1986   Dr Rolin Barry  . TEE WITHOUT CARDIOVERSION N/A 03/23/2017   Procedure: TRANSESOPHAGEAL ECHOCARDIOGRAM (TEE);  Surgeon: Rexene Alberts, MD;  Location: [place];  Service: Open Heart Surgery;  Laterality: N/A;  . THORACIC AORTIC ENDOVASCULAR STENT GRAFT N/A 03/30/2018   Procedure: THORACIC AORTIC ENDOVASCULAR STENT GRAFT WITH INTRAVASCULAR ULTRASOUND;  Surgeon: Serafina Mitchell, MD;  Location: [facility] OR;  Service: Vascular;  Laterality: N/A;  . TRACHEOSTOMY     age 52 for croup    Current Outpatient Medications  Medication Sig Dispense Refill  . albuterol (PROAIR HFA) 108 (90 Base) MCG/ACT inhaler Inhale 2 puffs into the lungs every 4 (four) hours as needed for wheezing or shortness of breath. 54 g 3  . albuterol (PROVENTIL) (2.5 MG/3ML) 0.083% nebulizer solution Take 3 mLs (2.5 mg total) by nebulization every 8 (eight) hours as needed for wheezing or shortness of breath. 150 mL 1  . ALPRAZolam (XANAX) 1 MG tablet Take 1-1.5 tablets (1-1.5 mg total) by mouth at bedtime. 40 tablet 1  . aspirin EC 81 MG tablet Take 1 tablet (81 mg total) by mouth daily.    Marland Kitchen augmented betamethasone dipropionate (DIPROLENE-AF) 0.05 %  cream Apply 1 application topically 2 (two) times daily as needed (itchy skin.). 50 g 1  . azelastine (ASTELIN) 0.1 % nasal spray Place 2 sprays into both nostrils 2 (two) times daily as needed for allergies.    . diphenhydrAMINE (BENADRYL) 25 MG tablet Take 25 mg by mouth every 8 (eight) hours as needed (allergies/runny nose).     Marland Kitchen ELIQUIS 5 MG TABS tablet TAKE 1 TABLET TWICE A DAY 180 tablet 1  . escitalopram (LEXAPRO) 10  MG tablet Take 1 tablet (10 mg total) by mouth daily. 90 tablet 3  . esomeprazole (NEXIUM) 40 MG capsule Take 1 capsule (40 mg total) by mouth daily before breakfast. 90 capsule 3  . Ferrous Sulfate (IRON) 325 (65 Fe) MG TABS Take 1 tablet (325 mg total) by mouth daily.  0  . flecainide (TAMBOCOR) 100 MG tablet Take 1 tablet (100 mg total) by mouth 2 (two) times daily. 180 tablet 3  . fluticasone (FLONASE) 50 MCG/ACT nasal spray Place 1 spray into both nostrils 2 (two) times daily as needed for allergies. Use one spray in each nostril twice daily.    . fluticasone furoate-vilanterol (BREO ELLIPTA) 200-25 MCG/INH AEPB Inhale 1 puff into the lungs at bedtime.    . furosemide (LASIX) 40 MG tablet Take 2 tablets (80 mg total) by mouth daily. 180 tablet 1  . metoprolol tartrate (LOPRESSOR) 25 MG tablet Take 1 tablet (25 mg total) by mouth 2 (two) times daily. 180 tablet 3  . potassium chloride (K-DUR) 10 MEQ tablet Take 1 tablet (10 mEq total) by mouth daily. 90 tablet 1  . rosuvastatin (CRESTOR) 5 MG tablet Take 1 tablet (5 mg total) by mouth daily. 90 tablet 3  . umeclidinium bromide (INCRUSE ELLIPTA) 62.5 MCG/INH AEPB Inhale 1 puff into the lungs at bedtime.      No current facility-administered medications for this visit.     Allergies:   Simvastatin, Celecoxib, Codeine, Nsaids, Tape, Cephalexin, and Latex   Social History:  The patient  reports that he quit smoking about 2 years ago. His smoking use included cigarettes. He has a 90.00 pack-year smoking history. He has never  used smokeless tobacco. He reports current alcohol use. He reports that he does not use drugs.   Family History:  The patient's family history includes Arrhythmia in his brother; Benign prostatic hyperplasia in his father; Breast cancer in his maternal grandmother; Heart attack in his maternal aunt and maternal grandmother; Hypertension in his father and mother.  ROS:  Please see the history of present illness. All other systems are reviewed and otherwise negative.   PHYSICAL EXAM:  VS:  There were no vitals taken for this visit. BMI: There is no height or weight on file to calculate BMI. Well nourished, well developed, in no acute distress  HEENT: normocephalic, atraumatic  Neck: no JVD, carotid bruits or masses Cardiac: RRR; 1-2/6SM, no rubs, or gallops, heart sounds are easily heard, not distant Lungs: he has some bronchial BS that clear easily with a cough, otherwise they are CTA b/l, no wheezing, rhonchi or rales  Abd: soft, nontender MS: no deformity or atrophy Ext:  Edema is 1+ to mid shin Skin: warm and dry, no rash Neuro:  No gross deficits appreciated Psych: euthymic mood, full affect   EKG:  Done today and reviewed by myself shows : SB 58bpm, RBBB, LAD, 1st degree AVblock 214ms (measured), QRS 139ms   09/23/2018: TTE IMPRESSIONS 1. The left ventricle has a visually estimated ejection fraction of of 55%. The cavity size was mildly dilated. There is mildly increased left ventricular wall thickness. Left ventricular diastolic Doppler parameters are consistent with  pseudonormalization Elevated left ventricular end-diastolic pressure No evidence of left ventricular regional wall motion abnormalities.  2. The right ventricle has mildly reduced systolic function. The cavity was mildly enlarged. There is no increase in right ventricular wall thickness.  3. Left atrial size was severely dilated.  4. Right atrial size was moderately dilated.  5.  The mitral valve is normal in  structure. No evidence of mitral valve stenosis. No regurgitation.  6. A 52mm an [facility] Ease pericardial tissue valve is present in the aortic position. No aortic insufficiency. Mean gradient 15 mmHg across the bioprosthetic valve, not significantly elevated.  7. The pulmonic valve was normal in structure.  8. The aortic root and ascending aorta are normal in size and structure.  9. The inferior vena cava was dilated in size with >50% respiratory variability. 10. No evidence of left ventricular regional wall motion abnormalities. 11. Right atrial pressure is estimated at 8 mmHg. 12. PA systolic pressure 46 mmHg. 13. The tricuspid valve is normal in structure.  FINDINGS  Left Ventricle: The left ventricle has a visually estimated ejection fraction of of 55%. The cavity size was mildly dilated. There is mildly increased left ventricular wall thickness. Left ventricular diastolic Doppler parameters are consistent with  pseudonormalization Elevated left ventricular end-diastolic pressure No evidence of left ventricular regional wall motion abnormalities.. Right Ventricle: The right ventricle has mildly reduced systolic function. The cavity was mildly enlarged. There is no increase in right ventricular wall thickness. Left Atrium: left atrial size was severely dilated Right Atrium: right atrial size was moderately dilated Right atrial pressure is estimated at 8 mmHg. Interatrial Septum: No atrial level shunt detected by color flow Doppler. Pericardium: There is no evidence of pericardial effusion. Mitral Valve: The mitral valve is normal in structure. Mitral valve regurgitation is not visualized by color flow Doppler. No evidence of mitral valve stenosis. Tricuspid Valve: The tricuspid valve is normal in structure. Tricuspid valve regurgitation is trivial by color flow Doppler. Aortic Valve: The aortic valve has been repaired/replaced Aortic valve regurgitation was not visualized by color flow  Doppler. A 78mm [facility] Ease pericardial tissue valve is present in the aortic position. Pulmonic Valve: The pulmonic valve was normal in structure. Pulmonic valve regurgitation is not visualized by color flow Doppler. Aorta: The aortic root and ascending aorta are normal in size and structure. Venous: The inferior vena cava is dilated in size with greater than 50% respiratory variability.   04/08/17: limited echo (post-pericardiocentesis) Study Conclusions - Left ventricle: The cavity size was normal. There was mild   hypertrophy of the septum with otherwise mild concentric   hypertrophy. Systolic function was normal. The estimated ejection   fraction was in the range of 50% to 55%. Wall motion was normal;   there were no regional wall motion abnormalities. - Aortic valve: Transvalvular velocity was within the normal range.   There was no stenosis. There was no regurgitation. - Mitral valve: Transvalvular velocity was within the normal range.   There was no evidence for stenosis. There was no regurgitation. - Left atrium: The atrium was moderately dilated. - Right ventricle: The cavity size was normal. Wall thickness was   normal. Systolic function was normal. - Right atrium: The atrium was moderately dilated. - Tricuspid valve: There was no regurgitation. - Pericardium, extracardiac: A small to moderate pericardial   effusion was identified anterior to the heart. Measures up to   1.6cm. There was no evidence of hemodynamic compromise. There was   a left pleural effusion.  04/06/17: Limited echo (pre-pericardiocentesis) Study Conclusions - Left ventricle: The cavity size was normal. Wall thickness was   normal. Systolic function was mildly to moderately reduced. The   estimated ejection fraction was in the range of 40% to 45%.   Diffuse hypokinesis. Doppler parameters are consistent with   abnormal  left ventricular relaxation (grade 1 diastolic   dysfunction). - Aortic valve:  A bioprosthesis was present and functioning   normally. - Pericardium, extracardiac: A moderate to large pericardial   effusion was identified circumferential to the heart. There was   no evidence of hemodynamic compromise. Impressions: - No significant change in pericardial effusion when compared to   prior study.   07/25/15: lexiscan stress Study Highlights   The left ventricular ejection fraction is mildly decreased (45-54%).  Nuclear stress EF: 48%.  There was no ST segment deviation noted during stress.  This is an intermediate risk study.   There is no ischemia.  There is diaphragmatic attenuation similar to prior study of 12/29/11. Since the prior study of 12/29/11, the EF has decreased from 58% to 48%.  The current study does not reveal any segmental wall motion abnormalities.     Recent Labs: 04/18/2018: Magnesium 2.0 06/23/2018: NT-Pro BNP 984 03/30/2019: ALT 9; BUN 10; Creatinine, Ser 1.02; Hemoglobin 14.3; Platelets 238.0; Potassium 4.4; Sodium 126  03/30/2019: Cholesterol 140; HDL 46.00; LDL Cholesterol 74; Total CHOL/HDL Ratio 3; Triglycerides 97.0; VLDL 19.4   Estimated Creatinine Clearance: 82.9 mL/min (by C-G formula based on SCr of 1.02 mg/dL).   Wt Readings from Last 3 Encounters:  03/30/19 216 lb 6 oz (98.1 kg)  01/16/19 211 lb 1.6 oz (95.8 kg)  09/22/18 201 lb 6 oz (91.3 kg)     Other studies reviewed: Additional studies/records reviewed today include: summarized above  ASSESSMENT AND PLAN:  1. Paroxysmal AFib     CHA2DS2Vasc is at least 2,  on Eliquis, appropriately dosed     No reports of bleeding, H/H stable by lasbs a few weeks ago\     PR and QRS are creeping out steadily     No symptoms of bradycardia     EKG/case  is reviewed with Dr. Caryl Comes, reduce his Flecainide to 75mg  BID     EKG in 2 weeks  2. 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 Bioprosthetic AVR     Feb 2020 echo noted above     Follows with Dr. Trula Slade   4. HTN     Could be better     Will add lisinopril 5mg  daily  5. Chronic CHF 6. HFpEF     Hyponatremia will be difficult     Weight is improved, still has some edema     SOB likely multifactorial COPD is described as mod-severe     Reviewed with Dr. Caryl Comes     He noted that ACE inhibitors can help increase Na+, (as noted above will add lisinopril to his regime), will reduce his furosemide to 80mg  QOD and his potassium to QOD as well.      BMET today and in 2 weeks     Disposition: med changes as above, BMET today, EKG and BMET in w2 weeks and visit in 4 weeks.  He will see his PMD in-between as well.     Current medicines are reviewed at length with the patient today.  The patient did not have any concerns regarding medicines.    Haywood Lasso, PA-C 04/10/2019 12:45 PM     [address] [address] [phone] (office)  [phone] (fax)

## 2019-04-11 ENCOUNTER — Ambulatory Visit (INDEPENDENT_AMBULATORY_CARE_PROVIDER_SITE_OTHER): Payer: Medicare Other | Admitting: Physician Assistant

## 2019-04-11 ENCOUNTER — Other Ambulatory Visit: Payer: Self-pay | Admitting: *Deleted

## 2019-04-11 ENCOUNTER — Other Ambulatory Visit: Payer: Self-pay

## 2019-04-11 VITALS — BP 138/64 | HR 58 | Ht 72.0 in | Wt 213.0 lb

## 2019-04-11 DIAGNOSIS — Z79899 Other long term (current) drug therapy: Secondary | ICD-10-CM | POA: Diagnosis not present

## 2019-04-11 DIAGNOSIS — I480 Paroxysmal atrial fibrillation: Secondary | ICD-10-CM

## 2019-04-11 DIAGNOSIS — I10 Essential (primary) hypertension: Secondary | ICD-10-CM | POA: Diagnosis not present

## 2019-04-11 DIAGNOSIS — I5032 Chronic diastolic (congestive) heart failure: Secondary | ICD-10-CM

## 2019-04-11 MED ORDER — LISINOPRIL 5 MG PO TABS: 5.0000 mg | ORAL_TABLET | Freq: Every day | ORAL | 3 refills | Status: DC

## 2019-04-11 MED ORDER — POTASSIUM CHLORIDE ER 10 MEQ PO TBCR: 10.0000 meq | EXTENDED_RELEASE_TABLET | ORAL | 1 refills | Status: DC

## 2019-04-11 MED ORDER — FUROSEMIDE 40 MG PO TABS: 80.0000 mg | ORAL_TABLET | ORAL | 1 refills | Status: DC

## 2019-04-11 MED ORDER — FLECAINIDE ACETATE 50 MG PO TABS: 75.0000 mg | ORAL_TABLET | Freq: Two times a day (BID) | ORAL | 3 refills | Status: DC

## 2019-04-11 NOTE — Patient Instructions (Addendum)
Medication Instructions:   START TAKING:   POTASSIUM 10 MEQ EVERY OTHER DAY   FUROSEMIDE  80 MG  EVERY OTHER DAY   FLECAINIDE  75 MG TWICE A DAY ( 50 MG TABLET AND HALF )  LISINOPRIL  5 MG ONCE A DAY    If you need a refill on your cardiac medications before your next appointment, please call your pharmacy.   Lab work:  BMET TODAY   AND BMET IN 2 WEEKS   If you have labs (blood work) drawn today and your tests are completely normal, you will receive your results only by: Marland Kitchen MyChart Message (if you have MyChart) OR . A paper copy in the mail If you have any lab test that is abnormal or we need to change your treatment, we will call you to review the results.  Testing/Procedures: NONE ORDERED  TODAY'   Follow-Up: At [facility], you and your health needs are our priority.  As part of our continuing mission to provide you with exceptional heart care, we have created designated Provider Care Teams.  These Care Teams include your primary Cardiologist (physician) and Advanced Practice Providers (APPs -  Physician Assistants and Nurse Practitioners) who all work together to provide you with the care you need, when you need it. You will need a follow up appointment in 2 weeks. NURSE VISIT EKG AND LAB    IN ONE MONTH   You may see Dr. Caryl Comes  or one of the following Advanced Practice Providers on your designated Care Team:   Chanetta Marshall, NP . Tommye Standard, PA-C . Joesph July PA-C  Any Other Special Instructions Will Be Listed Below (If Applicable).

## 2019-04-12 LAB — BASIC METABOLIC PANEL WITH GFR
BUN/Creatinine Ratio: 12 (ref 10–24)
BUN: 13 mg/dL (ref 8–27)
CO2: 25 mmol/L (ref 20–29)
Calcium: 9.3 mg/dL (ref 8.6–10.2)
Chloride: 85 mmol/L — ABNORMAL LOW (ref 96–106)
Creatinine, Ser: 1.12 mg/dL (ref 0.76–1.27)
GFR calc Af Amer: 77 mL/min/{1.73_m2}
GFR calc non Af Amer: 67 mL/min/{1.73_m2}
Glucose: 86 mg/dL (ref 65–99)
Potassium: 4.5 mmol/L (ref 3.5–5.2)
Sodium: 126 mmol/L — ABNORMAL LOW (ref 134–144)

## 2019-04-13 ENCOUNTER — Telehealth: Payer: Self-pay | Admitting: Internal Medicine

## 2019-04-13 NOTE — Telephone Encounter (Signed)
New Message    Pt c/o medication issue:  1. Name of Medication: Lisinopril 5mg   2. How are you currently taking this medication (dosage and times per day)? 1 tablet daily   3. Are you having a reaction (difficulty breathing--STAT)? No  4. What is your medication issue? Patient states it's going to take a while to receive it from mail order pharmacy and would like a few pills sent to [facility] in [place] so he can pick them up.

## 2019-04-14 ENCOUNTER — Other Ambulatory Visit: Payer: Self-pay | Admitting: *Deleted

## 2019-04-14 MED ORDER — LISINOPRIL 5 MG PO TABS: 5.0000 mg | ORAL_TABLET | Freq: Every day | ORAL | 0 refills | Status: DC

## 2019-04-26 ENCOUNTER — Encounter: Payer: Self-pay | Admitting: Cardiovascular Disease

## 2019-04-26 ENCOUNTER — Other Ambulatory Visit: Payer: Medicare Other | Admitting: *Deleted

## 2019-04-26 ENCOUNTER — Ambulatory Visit (INDEPENDENT_AMBULATORY_CARE_PROVIDER_SITE_OTHER): Payer: Medicare Other | Admitting: *Deleted

## 2019-04-26 ENCOUNTER — Other Ambulatory Visit: Payer: Self-pay

## 2019-04-26 VITALS — BP 128/76 | HR 64 | Ht 73.0 in | Wt 214.0 lb

## 2019-04-26 DIAGNOSIS — I480 Paroxysmal atrial fibrillation: Secondary | ICD-10-CM

## 2019-04-26 DIAGNOSIS — I10 Essential (primary) hypertension: Secondary | ICD-10-CM | POA: Diagnosis not present

## 2019-04-26 DIAGNOSIS — I4510 Unspecified right bundle-branch block: Secondary | ICD-10-CM

## 2019-04-26 DIAGNOSIS — I5032 Chronic diastolic (congestive) heart failure: Secondary | ICD-10-CM | POA: Diagnosis not present

## 2019-04-26 LAB — BASIC METABOLIC PANEL
BUN/Creatinine Ratio: 6 — ABNORMAL LOW (ref 10–24)
BUN: 7 mg/dL — ABNORMAL LOW (ref 8–27)
CO2: 25 mmol/L (ref 20–29)
Calcium: 9.2 mg/dL (ref 8.6–10.2)
Chloride: 87 mmol/L — ABNORMAL LOW (ref 96–106)
Creatinine, Ser: 1.12 mg/dL (ref 0.76–1.27)
GFR calc Af Amer: 77 mL/min/{1.73_m2} (ref >59)
GFR calc non Af Amer: 67 mL/min/{1.73_m2} (ref >59)
Glucose: 96 mg/dL (ref 65–99)
Potassium: 4.5 mmol/L (ref 3.5–5.2)
Sodium: 128 mmol/L — ABNORMAL LOW (ref 134–144)

## 2019-04-26 NOTE — Progress Notes (Signed)
Per Dr Angelena Form no change in meds and have Dr Caryl Comes review EKG . Pt notes lower extremity edema as well as SOB .Furosemide was recently changed to qod along with K supplement .Sean Riley Housekeeper

## 2019-04-26 NOTE — Patient Instructions (Signed)
Your physician recommends that you schedule a follow-up appointment in:  AS PLANNED

## 2019-04-27 ENCOUNTER — Ambulatory Visit (INDEPENDENT_AMBULATORY_CARE_PROVIDER_SITE_OTHER): Payer: Medicare Other | Admitting: Internal Medicine

## 2019-04-27 ENCOUNTER — Encounter: Payer: Self-pay | Admitting: Internal Medicine

## 2019-04-27 VITALS — BP 152/61 | HR 63 | Temp 97.9°F | Resp 16 | Ht 72.0 in | Wt 216.1 lb

## 2019-04-27 DIAGNOSIS — Z79899 Other long term (current) drug therapy: Secondary | ICD-10-CM | POA: Diagnosis not present

## 2019-04-27 DIAGNOSIS — Z23 Encounter for immunization: Secondary | ICD-10-CM

## 2019-04-27 DIAGNOSIS — I509 Heart failure, unspecified: Secondary | ICD-10-CM | POA: Diagnosis not present

## 2019-04-27 DIAGNOSIS — I10 Essential (primary) hypertension: Secondary | ICD-10-CM

## 2019-04-27 DIAGNOSIS — G4700 Insomnia, unspecified: Secondary | ICD-10-CM

## 2019-04-27 MED ORDER — LOSARTAN POTASSIUM 25 MG PO TABS: 25.0000 mg | ORAL_TABLET | Freq: Every day | ORAL | 1 refills | Status: DC

## 2019-04-27 NOTE — Progress Notes (Signed)
Pre visit review using our clinic review tool, if applicable. No additional management support is needed unless otherwise documented below in the visit note. 

## 2019-04-27 NOTE — Progress Notes (Signed)
Subjective:    Patient ID: Sean Riley, male    DOB: [birth year], 68 y.o.   MRN: [id number]  DOS:  04/27/2019 Type of visit - description: Follow-up Notes from cardiology reviewed. Reports good compliance with medication Ambulatory BPs and weight reviewed. Still drinking beer and sodas  Wt Readings from Last 3 Encounters:  04/27/19 216 lb 2 oz (98 kg)  04/26/19 214 lb (97.1 kg)  04/11/19 213 lb (96.6 kg)     Review of Systems Denies chest pain or palpitations Edema: Worse?Marland Kitchen Wife reports nocturnal cough, this is a chronic issue but much worse since he started lisinopril, cough is severe, wife is unable to sleep.   Past Medical History:  Diagnosis Date  . Allergy   . Arthritis   . Ascending aortic dissection ([place]) 03/23/2017  . Atrial fibrillation ([place])   . Cataract    removed both eyes   . GERD (gastroesophageal reflux disease)   . HYPERLIPIDEMIA   . 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 Hypertension   . Microscopic hematuria    negative cystoscopy  . NONSPECIFIC ABNORMAL ELECTROCARDIOGRAM   . Pleural effusion on right   . Pleural effusion, bilateral   . S/P aortic dissection repair 03/24/2017   Biological Bentall aortic root replacement + resection and grafting of entire ascending aorta, transverse aortic arch and proximal descending thoracic aorta with elephant trunk distal anastomosis and debranching of aortic arch vessels  . 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 Varicose veins     Past Surgical History:  Procedure Laterality Date  . APPENDECTOMY    . CARDIAC CATHETERIZATION      X3;Dr Caryl Comes, last 01/20/12: mild non-obstructive CAD, normal LV systolic function  . CARDIOVERSION N/A 05/31/2018   Procedure: CARDIOVERSION;  Surgeon: Sueanne Margarita,  MD;  Location: [facility] ENDOSCOPY;  Service: Cardiovascular;  Laterality: N/A;  . COLONOSCOPY    . CYSTOSCOPY  1978   Dr Hartley Barefoot  . EYE SURGERY    . IR THORACENTESIS ASP PLEURAL SPACE W/IMG GUIDE  11/25/2017  . KNEE ARTHROSCOPY  2012   Dr Theda Sers  . LUMBAR LAMINECTOMY  1987   Dr Durward Fortes  . MYELOGRAM  2007  . PERICARDIOCENTESIS N/A 04/07/2017   Procedure: PERICARDIOCENTESIS;  Surgeon: Sherren Mocha, MD;  Location: [place] CV LAB;  Service: Cardiovascular;  Laterality: N/A;  . POLYPECTOMY    . REPAIR OF ACUTE ASCENDING THORACIC AORTIC DISSECTION N/A 03/23/2017   Procedure: REPAIR OF ACUTE ASCENDING THORACIC AORTIC DISSECTION.  Bentall procedure.  Aortic root repleacement with bioprosthetic valve.  Reimplantation of left and right coronary arteries.  Total resection of transverse aortic arch.  Elephant trunk distal anastomosis and debranching of arch vessels.;  Surgeon: Rexene Alberts, MD;  Location: [place];  Service: Vascular;  Laterality: N/A;  . ROTATOR CUFF REPAIR    . SHOULDER ARTHROSCOPY  2011   Dr Theda Sers  . SHOULDER ARTHROSCOPY Right 08/2015  . SPINAL FUSION  1986   Dr Rolin Barry  . TEE WITHOUT CARDIOVERSION N/A 03/23/2017   Procedure: TRANSESOPHAGEAL ECHOCARDIOGRAM (TEE);  Surgeon: Rexene Alberts, MD;  Location: [place];  Service: Open Heart Surgery;  Laterality: N/A;  . THORACIC AORTIC ENDOVASCULAR STENT GRAFT N/A 03/30/2018   Procedure: THORACIC AORTIC ENDOVASCULAR STENT GRAFT WITH INTRAVASCULAR ULTRASOUND;  Surgeon: Serafina Mitchell, MD;  Location: [facility] OR;  Service: Vascular;  Laterality: N/A;  . TRACHEOSTOMY     age 56 for croup    Social History   Socioeconomic History  . Marital status: Married    Spouse name: Not on file  . Number of children: 2  . Years of education: Not on file  . Highest education level: Not on file  Occupational History  . 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 Financial resource strain: Not on file  . Food insecurity    Worry:  Not on file    Inability: Not on file  . Transportation needs    Medical: Not on file    Non-medical: Not on file  Tobacco Use  . Smoking status: Former Smoker    Packs/day: 2.00    Years: 45.00    Pack years: 90.00    Types: Cigarettes    Quit date: 03/22/2017    Years since quitting: 2.1  . Smokeless tobacco: Never Used  . Tobacco comment: 2 ppd , quit 03/2017  Substance and Sexual Activity  . Alcohol use: Yes    Alcohol/week: 0.0 standard drinks    Comment: 5 cans of beer daily  . Drug use: No  . Sexual activity: Never  Lifestyle  . Physical activity    Days per week: Not on file    Minutes per session: Not on file  . Stress: Not on file  Relationships  . Social Herbalist on phone: Not on file    Gets together: Not on file    Attends religious service: Not on file    Active member of club or organization: Not on file    Attends meetings of clubs or organizations: Not on file    Relationship status: Not on file  . Intimate partner violence    Fear of current or ex partner: Not on file    Emotionally abused: Not on file    Physically abused: Not on file    Forced sexual activity: Not on file  Other Topics Concern  . Not on file  Social History Narrative   Lives w/ wife      Allergies as of 04/27/2019      Reactions   Simvastatin Other (See Comments)   Mental status changes   Celecoxib Other (See Comments)   GI UPSET AND INFLAMMATION   Codeine Other (See Comments)   GI UPSET AND INFLAMMATION   Nsaids Other (See Comments)   GI UPSET AND INFLAMMATION (can tolerate via IV)   Tape Other (See Comments)   Medical tape and Band-Aids PULL OFF THE SKIN; please use Coban wrap   Cephalexin Itching, Rash   Latex Rash      Medication List       Accurate as of April 27, 2019 11:59 PM. If you have any questions, ask your nurse or doctor.        STOP taking these medications   lisinopril 5 MG tablet Commonly known as: ZESTRIL Stopped by: Kathlene November,  MD     TAKE these medications   albuterol (2.5 MG/3ML) 0.083% nebulizer solution Commonly known as: PROVENTIL Take 3 mLs (2.5 mg total) by nebulization every 8 (eight) hours as needed for wheezing or shortness of breath.   albuterol 108 (90 Base) MCG/ACT inhaler Commonly known as: ProAir HFA Inhale 2 puffs into the lungs every 4 (four) hours as needed for wheezing or shortness of breath.  ALPRAZolam 1 MG tablet Commonly known as: XANAX Take 1-1.5 tablets (1-1.5 mg total) by mouth at bedtime.   aspirin EC 81 MG tablet Take 1 tablet (81 mg total) by mouth daily.   augmented betamethasone dipropionate 0.05 % cream Commonly known as: DIPROLENE-AF Apply 1 application topically 2 (two) times daily as needed (itchy skin.).   azelastine 0.1 % nasal spray Commonly known as: ASTELIN Place 2 sprays into both nostrils 2 (two) times daily as needed for allergies.   diphenhydrAMINE 25 MG tablet Commonly known as: BENADRYL Take 25 mg by mouth every 8 (eight) hours as needed (allergies/runny nose).   Eliquis 5 MG Tabs tablet Generic drug: apixaban TAKE 1 TABLET TWICE A DAY   escitalopram 10 MG tablet Commonly known as: LEXAPRO Take 1 tablet (10 mg total) by mouth daily.   esomeprazole 40 MG capsule Commonly known as: NexIUM Take 1 capsule (40 mg total) by mouth daily before breakfast.   flecainide 50 MG tablet Commonly known as: TAMBOCOR Take 1.5 tablets (75 mg total) by mouth 2 (two) times daily.   fluticasone 50 MCG/ACT nasal spray Commonly known as: FLONASE Place 1 spray into both nostrils 2 (two) times daily as needed for allergies. Use one spray in each nostril twice daily.   fluticasone furoate-vilanterol 200-25 MCG/INH Aepb Commonly known as: Breo Ellipta Inhale 1 puff into the lungs at bedtime.   furosemide 40 MG tablet Commonly known as: LASIX Take 2 tablets (80 mg total) by mouth every other day.   Incruse Ellipta 62.5 MCG/INH Aepb Generic drug: umeclidinium  bromide Inhale 1 puff into the lungs at bedtime.   Iron 325 (65 Fe) MG Tabs Take 1 tablet (325 mg total) by mouth daily.   losartan 25 MG tablet Commonly known as: COZAAR Take 1 tablet (25 mg total) by mouth daily. Started by: Kathlene November, MD   metoprolol tartrate 25 MG tablet Commonly known as: LOPRESSOR Take 1 tablet (25 mg total) by mouth 2 (two) times daily.   potassium chloride 10 MEQ tablet Commonly known as: K-DUR Take 1 tablet (10 mEq total) by mouth every other day.   rosuvastatin 5 MG tablet Commonly known as: Crestor Take 1 tablet (5 mg total) by mouth daily.           Objective:   Physical Exam BP (!) 152/61 (BP Location: Left Arm, Patient Position: Sitting, Cuff Size: Small)   Pulse 63   Temp 97.9 F (36.6 C) (Temporal)   Resp 16   Ht 6' (1.829 m)   Wt 216 lb 2 oz (98 kg)   SpO2 96%   BMI 29.31 kg/m  General:   Well developed, NAD, BMI noted. HEENT:  Normocephalic . Face symmetric, atraumatic Lungs:  Slightly decreased breath sounds, worse at the bases. Normal respiratory effort, no intercostal retractions, no accessory muscle use. Heart: RRR, + systolic murmur.  +/+++ pretibial edema bilaterally  Skin: Not pale. Not jaundice Neurologic:  alert & oriented X3.  Speech normal, gait appropriate for age and unassisted Psych--  Cognition and judgment appear intact.  Cooperative with normal attention span and concentration.  Behavior appropriate. No anxious or depressed appearing.      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 Tried a cpap. [place] (-) 07-2015 CV: --  P- Atrial fibrillation Dr Caryl Comes, on NSRb --lexiscan 2013 --Cardiac catheterization July 2013: Normal LV FX,mild nonobstructive CAD Dr.  Angelena Form --thoracis Ao  dissection , acute, surgery 03-2017 --Aortic thoracic aneurysm endovascular stent graft 03-2018 GU:  --BPH, microscopic hematuria (negative cystoscopy 1978) --Elevated PSA: saw urology  04-2016 Gynecomastia, Dr. Loanne Drilling  >> Bx (-) 10-2014 ; took tamoxifen temporarily (self d/c 06-2016), improved Hypogonadism-- dx per Dr Loanne Drilling, numbers improved d/t nolvadex  Sees dermatology : eczema? rx taclonex H/o  Tracheostomy , age 52, croup Chronic anemia: Chronic anemia per chart review,+ iron deficient (08-2018). S/p  colonoscopy 05-2016, multiple polyps. EGD 2014 for chest pain: Negative, bx results?   PLAN: HTN: Since the last visit, lisinopril 5 mg was added.  Ambulatory BPs 130, 135.  Unfortunately his chronic cough is much worse, mostly at night. Plan: Stop lisinopril, start losartan 25 mg, BMP in 10 days, continue monitoring BPs.  If the cough is not improving they will let me know CHF Cardiology visit 04/11/2019: -Was recommend to reduce flecainide to 75 mg twice a day.  Lisinopril added. -They reduce Lasix to 80 mg and potassium supplements to every other day Patient reports he is about the same, weight at home range from 210 pounds to 212 pounds. Plan: Continue Lasix and potassium every other day, DC lisinopril, start losartan due to cough, BMP in 10 days. Continue metoprolol, flecainide. Continue monitoring his own weight, call cardiology if weight goes 5 pounds above his baseline. Pt education: Continue drinking 3 beers a day and  4-5 regular Dr. Malachi Bonds cans daily.  In very clear terms I advised patient that this is going against or our best efforts  to keep his fluid balanced.  Strongly recommend to stop or at least moderate beer and sodas intake. Flu shot today RTC for labs 10 days, office visit 2 months.  Keep appointment with cardiology   Today, I spent more than 25   min with the patient: >50% of the time counseling regards educating the patient about the need  to fluid restriction, adjusting his  medications, reviewing the chart and notes from other providers Also: -reviewing the chart and labs ordered by other providers  -coordinating his care

## 2019-04-27 NOTE — Patient Instructions (Addendum)
Please schedule Medicare Wellness with Sean Riley.   GO TO THE FRONT DESK Schedule labs to be done here in 10 days.  Schedule your next appointment   to see me in 2 months   Stop lisinopril 5 mg Start losartan 25 mg 1 tablet every day The cough that you are complaining about should improve in the next few days.  If that is not the case let me know  Continue checking your weight daily, if you increase 5 pounds or more, please contact cardiology.  Continue checking your blood pressures BP GOAL is between 110/65 and  135/85. If it is consistently higher or lower, let me know

## 2019-04-29 NOTE — Assessment & Plan Note (Signed)
HTN: Since the last visit, lisinopril 5 mg was added.  Ambulatory BPs 130, 135.  Unfortunately his chronic cough is much worse, mostly at night. Plan: Stop lisinopril, start losartan 25 mg, BMP in 10 days, continue monitoring BPs.  If the cough is not improving they will let me know CHF Cardiology visit 04/11/2019: -Was recommend to reduce flecainide to 75 mg twice a day.  Lisinopril added. -They reduce Lasix to 80 mg and potassium supplements to every other day Patient reports he is about the same, weight at home range from 210 pounds to 212 pounds. Plan: Continue Lasix and potassium every other day, DC lisinopril, start losartan due to cough, BMP in 10 days. Continue metoprolol, flecainide. Continue monitoring his own weight, call cardiology if weight goes 5 pounds above his baseline. Pt education: Continue drinking 3 beers a day and  4-5 regular Dr. Malachi Bonds cans daily.  In very clear terms I advised patient that this is going against or our best efforts  to keep his fluid balanced.  Strongly recommend to stop or at least moderate beer and sodas intake. Flu shot today RTC for labs 10 days, office visit 2 months.  Keep appointment with cardiology

## 2019-05-08 ENCOUNTER — Other Ambulatory Visit: Payer: Medicare Other

## 2019-05-16 ENCOUNTER — Encounter: Payer: Self-pay | Admitting: Internal Medicine

## 2019-05-17 NOTE — Progress Notes (Signed)
Cardiology Office Note Date:  05/17/2019  Patient ID:  Sean Riley, Sean Riley 03-05-1951, MRN [id number] PCP:  Colon Branch, MD  Electrophysiologist; Dr. Caryl Comes  Chief Complaint:  scheduled f/u  History of Present Illness: Sean Riley is a 68 y.o. 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) w/adult lifelong heavy smoking, and AFib maintained on flecainide  03/24/17  Emergency Repair of Aortic Dissection              Right axillary artery cannulation             Deep hypothermia and total circulatory arrest with continuous antegrade cerebral perfusion             Resection and grafting of ascending aorta, transverse aortic arch and proximal descending thoracic aorta             Elephant trunk distal anastomosis             Debranching of aortic arch vessles            Biological Bentall Aortic Root Replacement             Edwards Magna Ease Pericardial Tissue Valve (size 25 mm, model # 3300TFX, serial # [id number])             Gelweave Valsalva Aortic Root Graft (size 28 mm, ref # [id number], serial # [id number])             Reimplantation of Left Main and Right Coronary Arteries  He had DDCV Oct 2019.  He comes in today to be seen for Dr. Caryl Comes.  Last seen by him in Dec 2019, no changes were made at that visit, noting hx of difficulty with volume OL, suspected a component of dietary indiscretions.  I saw him 04/11/2019, he was accompanied by his wife.  He denied any CP, palpitations, no dizzy spells, no near syncope or syncope.  He never felt like he breathed well.  This dated back to the time of his dissection, waxes/wanes but feels like generally is easily winded.  No rest SOB.   His PMD saw him 8/20, suspected combined issue with his COPD, though perhaps more fluid OL.  He noted history of hyponatremia, cautiously increased his lasix. With plans to see him here early, and had f/u with him as well in a couple weeks.  He is wearing support stockings with some  improvement in his edema as well, he had noted in the AM his swelling is generally better then it is at the end of the day.  His wife had been monitoring his weights have drifted down about 3 lbs, not going back up yet, seems to have landed at 210lbs by his home scale the last few days.  She was getting SBP pretty consistently in the 150's  Case was reviewed with Dr. Caryl Comes, suspect that his DOE was multifactorial with significant COPD as well as fluid OL noting edema, given his hyponatremia his furosemide (and K+)  reduced to QOD dosing, added lisinopril for BP and with research, Dr. Caryl Comes found ACE-I could help improve hyponatremia.  Also noted that his ECG intervals, PR was progressively longer over the last few EKGs and his flecainide reduced to 75mg  BID.  He saw Dr. Larose Kells in f/u 9/17, unfortunately with the ACE his cough worsened significantly and was switched to losartan 25mg  daily.  Counseled on his fluid intake (3 beers daily and 4-5 sodas daily) with plans for labs (  10 days) and follow up in a month.  TODAY He is accompanied by his wife. All in all, both feel perhaps somewhat improved from his last visit. His wife remarks how his health /physical decline he had after his dissection.  They both have a hard time deciding weather or not his cough is better, worse or the same after change to the losartan from the lisinopril.  Dr. Ethel Rana not mentions with the addition of the ACE his cough was clearly worse.   The patient is very difficult to get a clear picture of how he is doing, he defers to his wife for answers or confirmation of his answers. His weight is down 8 lbs, and his LE edema looks improved to me.  His wife agrees, his swelling is better and thinks the compression/support stockings have as well helped quite a bit. No CP    Past Medical History:  Diagnosis Date  . Allergy   . Arthritis   . Ascending aortic dissection ([place]) 03/23/2017  . Atrial fibrillation ([place])   . Cataract     removed both eyes   . GERD (gastroesophageal reflux disease)   . HYPERLIPIDEMIA   . 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 Hypertension   . Microscopic hematuria    negative cystoscopy  . NONSPECIFIC ABNORMAL ELECTROCARDIOGRAM   . Pleural effusion on right   . Pleural effusion, bilateral   . S/P aortic dissection repair 03/24/2017   Biological Bentall aortic root replacement + resection and grafting of entire ascending aorta, transverse aortic arch and proximal descending thoracic aorta with elephant trunk distal anastomosis and debranching of aortic arch vessels  . 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 Ease bovine pericardial tissue valve and 28 mm Gelweave Valsalva aortic root graft with reimplantation of left main and right coronary arteries  . Varicose veins     Past Surgical History:  Procedure Laterality Date  . APPENDECTOMY    . CARDIAC CATHETERIZATION      X3;Dr Caryl Comes, last 01/20/12: mild non-obstructive CAD, normal LV systolic function  . CARDIOVERSION N/A 05/31/2018   Procedure: CARDIOVERSION;  Surgeon: Sueanne Margarita, MD;  Location: [facility] ENDOSCOPY;  Service: Cardiovascular;  Laterality: N/A;  . COLONOSCOPY    . CYSTOSCOPY  1978   Dr Hartley Barefoot  . EYE SURGERY    . IR THORACENTESIS ASP PLEURAL SPACE W/IMG GUIDE  11/25/2017  . KNEE ARTHROSCOPY  2012   Dr Theda Sers  . LUMBAR LAMINECTOMY  1987   Dr Durward Fortes  . MYELOGRAM  2007  . PERICARDIOCENTESIS N/A 04/07/2017   Procedure: PERICARDIOCENTESIS;  Surgeon: Sherren Mocha, MD;  Location: [facility] LAB;  Service: Cardiovascular;  Laterality: N/A;  . POLYPECTOMY    . REPAIR OF ACUTE ASCENDING THORACIC AORTIC DISSECTION N/A 03/23/2017   Procedure: REPAIR OF ACUTE ASCENDING THORACIC AORTIC DISSECTION.  Bentall procedure.  Aortic root repleacement with bioprosthetic valve.  Reimplantation of left and right coronary arteries.  Total resection of transverse aortic  arch.  Elephant trunk distal anastomosis and debranching of arch vessels.;  Surgeon: Rexene Alberts, MD;  Location: [facility];  Service: Vascular;  Laterality: N/A;  . ROTATOR CUFF REPAIR    . SHOULDER ARTHROSCOPY  2011   Dr Theda Sers  . SHOULDER ARTHROSCOPY Right 08/2015  . SPINAL FUSION  1986   Dr Rolin Barry  . TEE WITHOUT CARDIOVERSION N/A 03/23/2017   Procedure: TRANSESOPHAGEAL ECHOCARDIOGRAM (TEE);  Surgeon: Darylene Price  H, MD;  Location: [facility];  Service: Open Heart Surgery;  Laterality: N/A;  . THORACIC AORTIC ENDOVASCULAR STENT GRAFT N/A 03/30/2018   Procedure: THORACIC AORTIC ENDOVASCULAR STENT GRAFT WITH INTRAVASCULAR ULTRASOUND;  Surgeon: Serafina Mitchell, MD;  Location: [facility] OR;  Service: Vascular;  Laterality: N/A;  . TRACHEOSTOMY     age 48 for croup    Current Outpatient Medications  Medication Sig Dispense Refill  . albuterol (PROAIR HFA) 108 (90 Base) MCG/ACT inhaler Inhale 2 puffs into the lungs every 4 (four) hours as needed for wheezing or shortness of breath. 54 g 3  . albuterol (PROVENTIL) (2.5 MG/3ML) 0.083% nebulizer solution Take 3 mLs (2.5 mg total) by nebulization every 8 (eight) hours as needed for wheezing or shortness of breath. 150 mL 1  . ALPRAZolam (XANAX) 1 MG tablet Take 1-1.5 tablets (1-1.5 mg total) by mouth at bedtime. 40 tablet 1  . aspirin EC 81 MG tablet Take 1 tablet (81 mg total) by mouth daily.    Marland Kitchen augmented betamethasone dipropionate (DIPROLENE-AF) 0.05 % cream Apply 1 application topically 2 (two) times daily as needed (itchy skin.). 50 g 1  . azelastine (ASTELIN) 0.1 % nasal spray Place 2 sprays into both nostrils 2 (two) times daily as needed for allergies.    . diphenhydrAMINE (BENADRYL) 25 MG tablet Take 25 mg by mouth every 8 (eight) hours as needed (allergies/runny nose).     Marland Kitchen ELIQUIS 5 MG TABS tablet TAKE 1 TABLET TWICE A DAY 180 tablet 1  . escitalopram (LEXAPRO) 10 MG tablet Take 1 tablet (10 mg total) by mouth daily. 90 tablet 3  . esomeprazole  (NEXIUM) 40 MG capsule Take 1 capsule (40 mg total) by mouth daily before breakfast. 90 capsule 3  . Ferrous Sulfate (IRON) 325 (65 Fe) MG TABS Take 1 tablet (325 mg total) by mouth daily.  0  . flecainide (TAMBOCOR) 50 MG tablet Take 1.5 tablets (75 mg total) by mouth 2 (two) times daily. 270 tablet 3  . fluticasone (FLONASE) 50 MCG/ACT nasal spray Place 1 spray into both nostrils 2 (two) times daily as needed for allergies. Use one spray in each nostril twice daily.    . fluticasone furoate-vilanterol (BREO ELLIPTA) 200-25 MCG/INH AEPB Inhale 1 puff into the lungs at bedtime.    . furosemide (LASIX) 40 MG tablet Take 2 tablets (80 mg total) by mouth every other day. 180 tablet 1  . losartan (COZAAR) 25 MG tablet Take 1 tablet (25 mg total) by mouth daily. 30 tablet 1  . metoprolol tartrate (LOPRESSOR) 25 MG tablet Take 1 tablet (25 mg total) by mouth 2 (two) times daily. 180 tablet 3  . potassium chloride (K-DUR) 10 MEQ tablet Take 1 tablet (10 mEq total) by mouth every other day. 90 tablet 1  . rosuvastatin (CRESTOR) 5 MG tablet Take 1 tablet (5 mg total) by mouth daily. 90 tablet 3  . umeclidinium bromide (INCRUSE ELLIPTA) 62.5 MCG/INH AEPB Inhale 1 puff into the lungs at bedtime.      No current facility-administered medications for this visit.     Allergies:   Simvastatin, Celecoxib, Codeine, Nsaids, Tape, Cephalexin, and Latex   Social History:  The patient  reports that he quit smoking about 2 years ago. His smoking use included cigarettes. He has a 90.00 pack-year smoking history. He has never used smokeless tobacco. He reports current alcohol use. He reports that he does not use drugs.   Family History:  The patient's family history  includes Arrhythmia in his brother; Benign prostatic hyperplasia in his father; Breast cancer in his maternal grandmother; Heart attack in his maternal aunt and maternal grandmother; Hypertension in his father and mother.  ROS:  Please see the history of  present illness. All other systems are reviewed and otherwise negative.   PHYSICAL EXAM:  VS:  There were no vitals taken for this visit. BMI: There is no height or weight on file to calculate BMI. Well nourished, well developed, in no acute distress  HEENT: normocephalic, atraumatic  Neck: no JVD, carotid bruits or masses Cardiac: RRR; 2/6SM, no rubs, or gallops, heart sounds are easily heard, not distant Lungs: he has bronchial BS that nearly clear with cough, no wheezing, rhonchi or rales  Abd: soft, nontender MS: no deformity or atrophy Ext:  Edema is 1+, not as high and appear better then last month Skin: warm and dry, no rash Neuro:  No gross deficits appreciated Psych: euthymic mood, full affect   EKG:  Done today and reviewed by myself shows :  SR 64, 1st degree AVblock, manually measured 239ms, RBBB, LAD, unchanged from last  04/11/2019: SB 58bpm, RBBB, LAD, 1st degree AVblock 285ms (measured), QRS 137ms   09/23/2018: TTE IMPRESSIONS 1. The left ventricle has a visually estimated ejection fraction of of 55%. The cavity size was mildly dilated. There is mildly increased left ventricular wall thickness. Left ventricular diastolic Doppler parameters are consistent with  pseudonormalization Elevated left ventricular end-diastolic pressure No evidence of left ventricular regional wall motion abnormalities.  2. The right ventricle has mildly reduced systolic function. The cavity was mildly enlarged. There is no increase in right ventricular wall thickness.  3. Left atrial size was severely dilated.  4. Right atrial size was moderately dilated.  5. The mitral valve is normal in structure. No evidence of mitral valve stenosis. No regurgitation.  6. A 75mm an [facility] Ease pericardial tissue valve is present in the aortic position. No aortic insufficiency. Mean gradient 15 mmHg across the bioprosthetic valve, not significantly elevated.  7. The pulmonic valve was normal in  structure.  8. The aortic root and ascending aorta are normal in size and structure.  9. The inferior vena cava was dilated in size with >50% respiratory variability. 10. No evidence of left ventricular regional wall motion abnormalities. 11. Right atrial pressure is estimated at 8 mmHg. 12. PA systolic pressure 46 mmHg. 13. The tricuspid valve is normal in structure.  FINDINGS  Left Ventricle: The left ventricle has a visually estimated ejection fraction of of 55%. The cavity size was mildly dilated. There is mildly increased left ventricular wall thickness. Left ventricular diastolic Doppler parameters are consistent with  pseudonormalization Elevated left ventricular end-diastolic pressure No evidence of left ventricular regional wall motion abnormalities.. Right Ventricle: The right ventricle has mildly reduced systolic function. The cavity was mildly enlarged. There is no increase in right ventricular wall thickness. Left Atrium: left atrial size was severely dilated Right Atrium: right atrial size was moderately dilated Right atrial pressure is estimated at 8 mmHg. Interatrial Septum: No atrial level shunt detected by color flow Doppler. Pericardium: There is no evidence of pericardial effusion. Mitral Valve: The mitral valve is normal in structure. Mitral valve regurgitation is not visualized by color flow Doppler. No evidence of mitral valve stenosis. Tricuspid Valve: The tricuspid valve is normal in structure. Tricuspid valve regurgitation is trivial by color flow Doppler. Aortic Valve: The aortic valve has been repaired/replaced Aortic valve regurgitation was not visualized  by color flow Doppler. A 12mm [facility] Ease pericardial tissue valve is present in the aortic position. Pulmonic Valve: The pulmonic valve was normal in structure. Pulmonic valve regurgitation is not visualized by color flow Doppler. Aorta: The aortic root and ascending aorta are normal in size and structure.  Venous: The inferior vena cava is dilated in size with greater than 50% respiratory variability.   04/08/17: limited echo (post-pericardiocentesis) Study Conclusions - Left ventricle: The cavity size was normal. There was mild   hypertrophy of the septum with otherwise mild concentric   hypertrophy. Systolic function was normal. The estimated ejection   fraction was in the range of 50% to 55%. Wall motion was normal;   there were no regional wall motion abnormalities. - Aortic valve: Transvalvular velocity was within the normal range.   There was no stenosis. There was no regurgitation. - Mitral valve: Transvalvular velocity was within the normal range.   There was no evidence for stenosis. There was no regurgitation. - Left atrium: The atrium was moderately dilated. - Right ventricle: The cavity size was normal. Wall thickness was   normal. Systolic function was normal. - Right atrium: The atrium was moderately dilated. - Tricuspid valve: There was no regurgitation. - Pericardium, extracardiac: A small to moderate pericardial   effusion was identified anterior to the heart. Measures up to   1.6cm. There was no evidence of hemodynamic compromise. There was   a left pleural effusion.  04/06/17: Limited echo (pre-pericardiocentesis) Study Conclusions - Left ventricle: The cavity size was normal. Wall thickness was   normal. Systolic function was mildly to moderately reduced. The   estimated ejection fraction was in the range of 40% to 45%.   Diffuse hypokinesis. Doppler parameters are consistent with   abnormal left ventricular relaxation (grade 1 diastolic   dysfunction). - Aortic valve: A bioprosthesis was present and functioning   normally. - Pericardium, extracardiac: A moderate to large pericardial   effusion was identified circumferential to the heart. There was   no evidence of hemodynamic compromise. Impressions: - No significant change in pericardial effusion when  compared to   prior study.   07/25/15: lexiscan stress Study Highlights   The left ventricular ejection fraction is mildly decreased (45-54%).  Nuclear stress EF: 48%.  There was no ST segment deviation noted during stress.  This is an intermediate risk study.   There is no ischemia.  There is diaphragmatic attenuation similar to prior study of 12/29/11. Since the prior study of 12/29/11, the EF has decreased from 58% to 48%.  The current study does not reveal any segmental wall motion abnormalities.     Recent Labs: 06/23/2018: NT-Pro BNP 984 03/30/2019: ALT 9; Hemoglobin 14.3; Platelets 238.0 04/26/2019: BUN 7; Creatinine, Ser 1.12; Potassium 4.5; Sodium 128  03/30/2019: Cholesterol 140; HDL 46.00; LDL Cholesterol 74; Total CHOL/HDL Ratio 3; Triglycerides 97.0; VLDL 19.4   CrCl cannot be calculated (Unknown ideal weight.).   Wt Readings from Last 3 Encounters:  04/27/19 216 lb 2 oz (98 kg)  04/26/19 214 lb (97.1 kg)  04/11/19 213 lb (96.6 kg)     Other studies reviewed: Additional studies/records reviewed today include: summarized above  ASSESSMENT AND PLAN:  1. Paroxysmal AFib     CHA2DS2Vasc is at least 2,  on Eliquis, appropriately dosed     On flecainide and metoprolol     EKG without much improvement if any in his PR interval from last     Reduce flecainide to 50mg  BID  2. 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 Bioprosthetic AVR     Feb 2020 echo noted above     Follows with Dr. Trula Slade   4. HTN     No change today    5. Chronic CHF 6. HFpEF     Hyponatremia will be difficult      His bronchial BS nearly clear with cough, his edema looks better to me. I am uncertain to the chronicity of his SOB, they both seem to compare to "before the surgery"  Observed him cough today, seems more of a upper airway/clearing of his throat (?), they  report worse coughing at times as well Weight is down more His wife mentions they have had him use her nebulizer perhaps with some improvement and they are asked to d/w Dr. Larose Kells regarding his inhalers, COPD and any nebulizer treatments  Start with BMET and CXR today If labs stable and no edema, effusions... will plan no change to his lasix   Disposition:he sees Dr. Larose Kells next month, will have him back here in 6 weeks, sooner if needed.    Current medicines are reviewed at length with the patient today.  The patient did not have any concerns regarding medicines.    Haywood Lasso, PA-C 05/17/2019 6:32 AM     [place] [address] [phone] (office)  [phone] (fax)

## 2019-05-18 ENCOUNTER — Ambulatory Visit (INDEPENDENT_AMBULATORY_CARE_PROVIDER_SITE_OTHER): Payer: Medicare Other | Admitting: Physician Assistant

## 2019-05-18 ENCOUNTER — Other Ambulatory Visit: Payer: Self-pay

## 2019-05-18 VITALS — BP 140/60 | HR 64 | Ht 72.0 in | Wt 208.0 lb

## 2019-05-18 DIAGNOSIS — I5030 Unspecified diastolic (congestive) heart failure: Secondary | ICD-10-CM

## 2019-05-18 DIAGNOSIS — I10 Essential (primary) hypertension: Secondary | ICD-10-CM | POA: Diagnosis not present

## 2019-05-18 DIAGNOSIS — I5032 Chronic diastolic (congestive) heart failure: Secondary | ICD-10-CM

## 2019-05-18 DIAGNOSIS — Z79899 Other long term (current) drug therapy: Secondary | ICD-10-CM

## 2019-05-18 DIAGNOSIS — I480 Paroxysmal atrial fibrillation: Secondary | ICD-10-CM

## 2019-05-18 MED ORDER — FLECAINIDE ACETATE 50 MG PO TABS: 50.0000 mg | ORAL_TABLET | Freq: Two times a day (BID) | ORAL | 3 refills | Status: DC

## 2019-05-18 MED ORDER — METOPROLOL TARTRATE 25 MG PO TABS: 25.0000 mg | ORAL_TABLET | Freq: Two times a day (BID) | ORAL | 3 refills | Status: DC

## 2019-05-18 NOTE — Patient Instructions (Addendum)
Medication Instructions:   START TAKING FLECAINIDE 50 MG TWICE A DAY    If you need a refill on your cardiac medications before your next appointment, please call your pharmacy.   Lab work:  BMET  If you have labs (blood work) drawn today and your tests are completely normal, you will receive your results only by: Marland Kitchen MyChart Message (if you have MyChart) OR . A paper copy in the mail If you have any lab test that is abnormal or we need to change your treatment, we will call you to review the results.  Testing/Procedures: A chest x-ray takes a picture of the organs and structures inside the chest, including the heart, lungs, and blood vessels. This test can show several things, including, whether the heart is enlarges; whether fluid is building up in the lungs; and whether pacemaker / defibrillator leads are still in place.  we [facility] first floor off [address]  Follow-Up: At [facility], you and your health needs are our priority.  As part of our continuing mission to provide you with exceptional heart care, we have created designated Provider Care Teams.  These Care Teams include your primary Cardiologist (physician) and Advanced Practice Providers (APPs -  Physician Assistants and Nurse Practitioners) who all work together to provide you with the care you need, when you need it. You will need a follow up appointment in 6 weeks.  Please call our office 2 months in advance to schedule this appointment.  You may see DrMarland Kitchen Caryl Comes  or one of the following Electrical engineer on your designated Care Team:   Chanetta Marshall, NP . Tommye Standard, PA-C . Donavan Burnet PA-C   Any Other Special Instructions Will Be Listed Below (If Applicable).

## 2019-05-19 LAB — BASIC METABOLIC PANEL
BUN/Creatinine Ratio: 10 (ref 10–24)
BUN: 11 mg/dL (ref 8–27)
CO2: 24 mmol/L (ref 20–29)
Calcium: 9.4 mg/dL (ref 8.6–10.2)
Chloride: 87 mmol/L — ABNORMAL LOW (ref 96–106)
Creatinine, Ser: 1.15 mg/dL (ref 0.76–1.27)
GFR calc Af Amer: 75 mL/min/{1.73_m2} (ref >59)
GFR calc non Af Amer: 65 mL/min/{1.73_m2} (ref >59)
Glucose: 82 mg/dL (ref 65–99)
Potassium: 4.5 mmol/L (ref 3.5–5.2)
Sodium: 126 mmol/L — ABNORMAL LOW (ref 134–144)

## 2019-05-20 ENCOUNTER — Telehealth: Payer: Self-pay | Admitting: Internal Medicine

## 2019-05-22 ENCOUNTER — Ambulatory Visit
Admission: RE | Admit: 2019-05-22 | Discharge: 2019-05-22 | Disposition: A | Discharge status: Home / Self Care | Payer: Medicare Other | Source: Ambulatory Visit | Attending: Physician Assistant | Admitting: Physician Assistant

## 2019-05-22 DIAGNOSIS — R0602 Shortness of breath: Secondary | ICD-10-CM | POA: Diagnosis not present

## 2019-05-22 DIAGNOSIS — I5032 Chronic diastolic (congestive) heart failure: Secondary | ICD-10-CM

## 2019-05-22 NOTE — Telephone Encounter (Signed)
Alprazolam refill.   Last OV: 04/27/2019 Last Fill: 12/27/2018 #40 and 1RF Pt sig: 1 to 1.5 tabs qhs UDS: 04/18/2018 Low risk

## 2019-05-22 NOTE — Telephone Encounter (Signed)
RF sent.

## 2019-05-23 ENCOUNTER — Other Ambulatory Visit: Payer: Self-pay | Admitting: *Deleted

## 2019-05-23 DIAGNOSIS — I5032 Chronic diastolic (congestive) heart failure: Secondary | ICD-10-CM

## 2019-05-23 NOTE — Progress Notes (Signed)
amb ref °

## 2019-05-24 ENCOUNTER — Telehealth: Payer: Self-pay | Admitting: Physician Assistant

## 2019-05-24 NOTE — Telephone Encounter (Signed)
New message      Returning the nurses call to get xray results

## 2019-06-02 ENCOUNTER — Ambulatory Visit: Payer: Medicare Other | Admitting: Internal Medicine

## 2019-06-06 ENCOUNTER — Other Ambulatory Visit: Payer: Self-pay

## 2019-06-06 ENCOUNTER — Encounter (HOSPITAL_COMMUNITY): Payer: Self-pay | Admitting: Cardiology

## 2019-06-06 ENCOUNTER — Other Ambulatory Visit (HOSPITAL_COMMUNITY): Payer: Self-pay

## 2019-06-06 ENCOUNTER — Ambulatory Visit (HOSPITAL_COMMUNITY)
Admission: RE | Admit: 2019-06-06 | Discharge: 2019-06-06 | Disposition: A | Discharge status: Home / Self Care | Payer: Medicare Other | Source: Ambulatory Visit | Attending: Cardiology | Admitting: Cardiology

## 2019-06-06 VITALS — BP 127/41 | HR 57 | Wt 212.6 lb

## 2019-06-06 DIAGNOSIS — Z7982 Long term (current) use of aspirin: Secondary | ICD-10-CM | POA: Diagnosis not present

## 2019-06-06 DIAGNOSIS — J449 Chronic obstructive pulmonary disease, unspecified: Secondary | ICD-10-CM | POA: Insufficient documentation

## 2019-06-06 DIAGNOSIS — G4700 Insomnia, unspecified: Secondary | ICD-10-CM | POA: Insufficient documentation

## 2019-06-06 DIAGNOSIS — E871 Hypo-osmolality and hyponatremia: Secondary | ICD-10-CM | POA: Diagnosis not present

## 2019-06-06 DIAGNOSIS — I480 Paroxysmal atrial fibrillation: Secondary | ICD-10-CM

## 2019-06-06 DIAGNOSIS — I5032 Chronic diastolic (congestive) heart failure: Secondary | ICD-10-CM

## 2019-06-06 DIAGNOSIS — Z7901 Long term (current) use of anticoagulants: Secondary | ICD-10-CM | POA: Diagnosis not present

## 2019-06-06 DIAGNOSIS — I7103 Dissection of thoracoabdominal aorta: Secondary | ICD-10-CM | POA: Diagnosis not present

## 2019-06-06 DIAGNOSIS — I110 Hypertensive heart disease with heart failure: Secondary | ICD-10-CM | POA: Diagnosis not present

## 2019-06-06 DIAGNOSIS — I5030 Unspecified diastolic (congestive) heart failure: Secondary | ICD-10-CM

## 2019-06-06 DIAGNOSIS — Z87891 Personal history of nicotine dependence: Secondary | ICD-10-CM | POA: Diagnosis not present

## 2019-06-06 DIAGNOSIS — Z79899 Other long term (current) drug therapy: Secondary | ICD-10-CM | POA: Diagnosis not present

## 2019-06-06 DIAGNOSIS — R0683 Snoring: Secondary | ICD-10-CM | POA: Diagnosis not present

## 2019-06-06 LAB — CBC
HCT: 37.6 % — ABNORMAL LOW (ref 39.0–52.0)
Hemoglobin: 12.9 g/dL — ABNORMAL LOW (ref 13.0–17.0)
MCH: 32.8 pg (ref 26.0–34.0)
MCHC: 34.3 g/dL (ref 30.0–36.0)
MCV: 95.7 fL (ref 80.0–100.0)
Platelets: 237 10*3/uL (ref 150–400)
RBC: 3.93 MIL/uL — ABNORMAL LOW (ref 4.22–5.81)
RDW: 12.8 % (ref 11.5–15.5)
WBC: 6.5 10*3/uL (ref 4.0–10.5)
nRBC: 0 % (ref 0.0–0.2)

## 2019-06-06 LAB — BASIC METABOLIC PANEL
Anion gap: 11 (ref 5–15)
BUN: 10 mg/dL (ref 8–23)
CO2: 23 mmol/L (ref 22–32)
Calcium: 8.9 mg/dL (ref 8.9–10.3)
Chloride: 92 mmol/L — ABNORMAL LOW (ref 98–111)
Creatinine, Ser: 0.94 mg/dL (ref 0.61–1.24)
GFR calc Af Amer: >60 mL/min (ref >60)
GFR calc non Af Amer: >60 mL/min (ref >60)
Glucose, Bld: 96 mg/dL (ref 70–99)
Potassium: 3.9 mmol/L (ref 3.5–5.1)
Sodium: 126 mmol/L — ABNORMAL LOW (ref 135–145)

## 2019-06-06 MED ORDER — SODIUM CHLORIDE 0.9% FLUSH: 3.0000 mL | Freq: Two times a day (BID) | INTRAVENOUS | Status: DC

## 2019-06-06 MED ORDER — POTASSIUM CHLORIDE CRYS ER 20 MEQ PO TBCR: 20.0000 meq | EXTENDED_RELEASE_TABLET | Freq: Every day | ORAL | 3 refills | Status: DC

## 2019-06-06 MED ORDER — TORSEMIDE 20 MG PO TABS: 40.0000 mg | ORAL_TABLET | Freq: Every day | ORAL | 3 refills | Status: DC

## 2019-06-06 NOTE — Progress Notes (Signed)
Height:6'0   Weight: 212 BMI: 28.8  Today's Date:06/06/19  STOP BANG RISK ASSESSMENT S (snore) Have you been told that you snore?     YES   T (tired) Are you often tired, fatigued, or sleepy during the day?   YES  O (obstruction) Do you stop breathing, choke, or gasp during sleep? NO   P (pressure) Do you have or are you being treated for high blood pressure? YES   B (BMI) Is your body index greater than 35 kg/m? NO   A (age) Are you 69 years old or older? YES   N (neck) Do you have a neck circumference greater than 16 inches?   YES   G (gender) Are you a male? YES   TOTAL STOP/BANG "YES" ANSWERS                                                                        For Office Use Only              Procedure Order Form    YES to 3+ Stop Bang questions OR two clinical symptoms - patient qualifies for WatchPAT (CPT 95800)      Clinical Notes: Will consult Sleep Specialist and refer for management of therapy due to patient increased risk of Sleep Apnea. Ordering a sleep study due to the following two clinical symptoms: Excessive daytime sleepiness G47.10Loud snoring R06.83  / History of high blood pressure R03.0 / Insomnia G47.00   Which test do you need, WP1 or WP300??? WP300  . Do you have access to a smart device containing the app stores?  If YES, then -->WP1   If NO, then  --->WP300

## 2019-06-06 NOTE — Progress Notes (Signed)
Patient Name: Sean Riley         DOB: 08-28-1950      Height: 6'   [id number]  Office Name: [facility]         Referring Provider: [name]  Today's Date: 06/06/19  Date:   STOP BANG RISK ASSESSMENT S (snore) Have you been told that you snore?     YES/NO   T (tired) Are you often tired, fatigued, or sleepy during the day?   YES/NO  O (obstruction) Do you stop breathing, choke, or gasp during sleep? YES/NO   P (pressure) Do you have or are you being treated for high blood pressure? YES/NO   B (BMI) Is your body index greater than 35 kg/m? YES/NO   A (age) Are you 35 years old or older? YES/NO   N (neck) Do you have a neck circumference greater than 16 inches?   YES/NO   G (gender) Are you a male? YES/NO   TOTAL STOP/BANG "YES" ANSWERS                                                                        For Office Use Only              Procedure Order Form    YES to 3+ Stop Bang questions OR two clinical symptoms - patient qualifies for WatchPAT (CPT 95800)     Submit: This Form + Patient Face Sheet + Clinical Note via CloudPAT or Fax: [phone]         Clinical Notes: Will consult Sleep Specialist and refer for management of therapy due to patient increased risk of Sleep Apnea. Ordering a sleep study due to the following two clinical symptoms: Excessive daytime sleepiness G47.10 / Gastroesophageal reflux K21.9 / Nocturia R35.1 / Morning Headaches G44.221 / Difficulty concentrating R41.840 / Memory problems or poor judgment G31.84 / Personality changes or irritability R45.4 / Loud snoring R06.83 / Depression F32.9 / Unrefreshed by sleep G47.8 / Impotence N52.9 / History of high blood pressure R03.0 / Insomnia G47.00    I understand that I am proceeding with a home sleep apnea test as ordered by my treating physician. I understand that untreated sleep apnea is a serious cardiovascular risk factor and it is my responsibility to perform the test and seek  management for sleep apnea. I will be contacted with the results and be managed for sleep apnea by a local sleep physician. I will be receiving equipment and further instructions from [facility]. I shall promptly ship back the equipment via the included mailing label. I understand my insurance will be billed for the test and as the patient I am responsible for any insurance related out-of-pocket costs incurred. I have been provided with written instructions and can call for additional video or telephonic instruction, with 24-hour availability of qualified personnel to answer any questions: Patient Help Desk [phone].  Patient Signature ______________________________________________________   Date______________________ Patient Telemedicine Verbal Consent

## 2019-06-06 NOTE — Progress Notes (Deleted)
SATURATION QUALIFICATIONS: (This note is used to comply with regulatory documentation for home oxygen)  Patient Saturations on Room Air at Rest = 96%  Patient Saturations on Room Air while Ambulating = 82%  Patient Saturations on 2 Liters of oxygen while Ambulating = 96%  Please briefly explain why patient needs home oxygen: Pt ambulated for 6 minutes and oxygen desaturated to 82%.  Pt needs oxygen for activity while at home.

## 2019-06-06 NOTE — Patient Instructions (Addendum)
STOP Lasix  START Torsemide 40mg  (2 tabs) daily  START Potassium 20 meq (1 tab) daily  Please call Dr Vaughan Browner office to schedule an appointment  You have been referred to Pulmonary Rehab.  They will call you to schedule this appointment.   Your provider has recommended that you have a home sleep study.  [employer] is the company that does these test. They will contact you by phone and must speak with you before they can ship the equipment.  Once they have spoken with you they will send the equipment right to your home with instructions on how to set it up.  Once you have completed the test simply box all the equipment back up and mail back to the company.  IF you have any questions or issues with the equipment please call the company directly at [phone].  If your test is positive for sleep apnea and you need a home CPAP machine you will be contacted by Dr Theodosia Blender office [facility]) to set this up.  Your physician recommends that you schedule a follow-up appointment in: 1 month with Dr Aundra Dubin   [facility] CLINICS [place] [id number] [place] [address] Dept: [phone] Loc: [place]  06/06/2019  You are scheduled for a Cardiac Catheterization on Thursday, November 5 with Dr. [name].  1. Please arrive at the [facility] (Main Entrance A) at [facility]: [address] [place], [place] [address] at 9:00 AM (This time is two hours before your procedure to ensure your preparation). Free valet parking service is available.   Special note: Every effort is made to have your procedure done on time. Please understand that emergencies sometimes delay scheduled procedures.  2. Diet: Do not eat solid foods after midnight.  The patient may have clear liquids until 5am upon the day of the procedure.  3. Labs: You will need to have blood drawn on Monday, November 2 at  the [facility]. You do not need to be fasting. You will also need pre procedure COVID testing done on Monday November 2,2020 Anytime between 8am-3pm [place], [place] [address] -Once you have this test complete, you will need to remain home in quarantine until you return for your procedure.  4. Medication instructions in preparation for your procedure:   Contrast Allergy: No   Stop taking Eliquis (Apixiban) on Tuesday, November 3.  Hold your Torsemide on Thursday, November 5    On the morning of your procedure, take your Aspirin and any morning medicines NOT listed above.  You may use sips of water.  5. Plan for one night stay--bring personal belongings. 6. Bring a current list of your medications and current insurance cards. 7. You MUST have a responsible person to drive you home. 8. Someone MUST be with you the first 24 hours after you arrive home or your discharge will be delayed. 9. Please wear clothes that are easy to get on and off and wear slip-on shoes.  Thank you for allowing [name] to care for you!   -- [facility]

## 2019-06-06 NOTE — Progress Notes (Signed)
.  SATURATION QUALIFICATIONS: (This note is used to comply with regulatory documentation for home oxygen)  Patient Saturations on Room Air at Rest =92%  Patient Saturations on Room Air while Ambulating = 88%  Patient Saturations on 2 Liters of oxygen while Ambulating = 92%  Please briefly explain why patient needs home oxygen: patient ambulated in the hallway for approx. 40min and his Sp02 dropped to 88%. Patient verbalized sob. Pt recovered with 2L of O2. Saturation improved.

## 2019-06-07 NOTE — Progress Notes (Signed)
PCP: Colon Branch, MD Cardiology: Dr. Caryl Comes HF Cardiology: Dr. Aundra Dubin  68 y.o. with history of COPD, paroxysmal atrial fibrillation, type A aortic dissection, and chronic diastolic CHF was referred by Dr. Caryl Comes for evaluation of CHF.  Prior to 2018, patient reports no significant limitations.  In 8/18, he had a Type A aortic dissection.  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  The dissection extended down to the abdominal aorta.  He was a heavy smoker, quit in 8/18.  He developed paroxysmal atrial fibrillation, had DCCV in 10/19.  He is on Eliquis and flecainide now.  Last echo in 2/20 showed EF 55%, mildly decreased RV systolic function, normally functioning bioprosthetic aortic valve.  PFTs in 6/19 showed moderate-severe obstruction.    Ever since his operation in 2018, Sean Riley has had significant exertional dyspnea and has been quite limited.  He is currently short of breath walking about 30 feet (across his house). He has orthopnea, sleeps propped up in sleep number bed or in a recliner.  He is very limited, not able to do much more than walk around the house. Moderate exertion of any form wears him out.  He is fatigued during the day and snores, wife says he will stop breathing at night. No chest pain.  No palpitations, he is in NSR today.   Oxygen saturation dropped to 88% with exertion.   REDS clip 41%  ECG (10/20, personally reviewed): NSR, RBBB, LAFB  Labs (8/20): LDL 74 Labs (10/20): Na 126, K 4.5, creatinine 1.15  PMH: 1. COPD: PFTs (6/19) with moderate-severe obstruction.  Prior heavy smoker, quit 2018.  2. Atrial fibrillation: Paroxysmal. DCCV in 10/19, now on flecainide.  3. HTN 4. Chronic diastolic CHF: Echo (A999333) with EF 55%, mild LVH, mild LV dilation, mild RV dilation with mildly decreased systolic function, bioprosthetic aortic valve with mean gradient 15 mmHg.  5. Type A aortic dissection: 8/18, patient had  emergency repair with Bentall/bioprosthetic aortic valve.  Dissection extends down to abdominal aorta.  Coronaries re-implanted. Complicated by pericardial effusion requiring pericardiocentesis.   FH: Uncle with aortic dissection, mother with SCD at age 11.   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  Married, prior heavy smoker but quit in 2018.   ROS: All systems reviewed and negative except as per HPI.   Current Outpatient Medications  Medication Sig Dispense Refill  . albuterol (PROAIR HFA) 108 (90 Base) MCG/ACT inhaler Inhale 2 puffs into the lungs every 4 (four) hours as needed for wheezing or shortness of breath. 54 g 3  . albuterol (PROVENTIL) (2.5 MG/3ML) 0.083% nebulizer solution Take 3 mLs (2.5 mg total) by nebulization every 8 (eight) hours as needed for wheezing or shortness of breath. 150 mL 1  . ALPRAZolam (XANAX) 1 MG tablet TAKE 1-1.5 TABLETS (1-1.5 MG TOTAL) BY MOUTH AT BEDTIME. 40 tablet 0  . aspirin EC 81 MG tablet Take 1 tablet (81 mg total) by mouth daily.    Marland Kitchen augmented betamethasone dipropionate (DIPROLENE-AF) 0.05 % cream Apply 1 application topically 2 (two) times daily as needed (itchy skin.). 50 g 1  . azelastine (ASTELIN) 0.1 % nasal spray Place 2 sprays into both nostrils 2 (two) times daily as needed for allergies.    . diphenhydrAMINE (BENADRYL) 25 MG tablet Take 25 mg by mouth every 8 (eight) hours as needed (allergies/runny nose).     Marland Kitchen ELIQUIS 5 MG TABS tablet TAKE 1 TABLET TWICE A DAY 180 tablet  1  . escitalopram (LEXAPRO) 10 MG tablet Take 1 tablet (10 mg total) by mouth daily. 90 tablet 3  . esomeprazole (NEXIUM) 40 MG capsule Take 1 capsule (40 mg total) by mouth daily before breakfast. 90 capsule 3  . flecainide (TAMBOCOR) 50 MG tablet Take 1 tablet (50 mg total) by mouth 2 (two) times daily. 270 tablet 3  . fluticasone (FLONASE) 50 MCG/ACT nasal spray Place 1 spray into both nostrils 2 (two) times daily as needed for allergies. Use one spray in each nostril  twice daily.    . fluticasone furoate-vilanterol (BREO ELLIPTA) 200-25 MCG/INH AEPB Inhale 1 puff into the lungs at bedtime.    Marland Kitchen losartan (COZAAR) 25 MG tablet Take 1 tablet (25 mg total) by mouth daily. 30 tablet 1  . metoprolol tartrate (LOPRESSOR) 25 MG tablet Take 1 tablet (25 mg total) by mouth 2 (two) times daily. 180 tablet 3  . rosuvastatin (CRESTOR) 5 MG tablet Take 1 tablet (5 mg total) by mouth daily. 90 tablet 3  . umeclidinium bromide (INCRUSE ELLIPTA) 62.5 MCG/INH AEPB Inhale 1 puff into the lungs at bedtime.     . potassium chloride SA (KLOR-CON) 20 MEQ tablet Take 1 tablet (20 mEq total) by mouth daily. 90 tablet 3  . torsemide (DEMADEX) 20 MG tablet Take 2 tablets (40 mg total) by mouth daily. 180 tablet 3   No current facility-administered medications for this encounter.    BP (!) 127/41   Pulse (!) 57   Wt 96.4 kg (212 lb 9.6 oz)   SpO2 96%   BMI 28.83 kg/m  General: NAD Neck: JVP 8-9 cm with HJR, no thyromegaly or thyroid nodule.  Lungs: Bilateral rhonchi.  CV: Nondisplaced PMI.  Heart regular S1/S2, no S3/S4, 2/6 SEM RUSB.  1+ edema 3/4 to knees bilaterally.  No carotid bruit.  Normal pedal pulses.  Abdomen: Soft, nontender, no hepatosplenomegaly, no distention.  Skin: Intact without lesions or rashes.  Neurologic: Alert and oriented x 3.  Psych: Normal affect. Extremities: No clubbing or cyanosis.  HEENT: Normal.   Assessment/Plan: 1. Atrial fibrillation: Paroxysmal.  He is currently in NSR.  - Continue Eliquis 5 mg bid.  - Continue flecainide + metoprolol.  2. Chronic diastolic CHF: Echo in A999333 with EF 55%, mildly decreased RV systolic function, normally functioning bioprosthetic aortic valve. On exam, he does look volume overloaded and REDS clip measures 41% (elevated). NYHA class III.  I suspect that COPD also contributes to his exercise intolerance.  - Stop Lasix.  - Start torsemide 40 mg daily and KCl 10 daily.  BMET in 1 week.  - I am going to arrange  RHC to assess filling pressures more formally.  We discussed risks/benefits and he agrees to procedure.  3. COPD: Moderate to severe obstruction on 6/19 PFTs, prior smoker.  As above, I suspect this contributes to his dyspnea.  - I will refer him back to pulmonary (saw Dr. Vaughan Browner in the past).  - Refer to pulmonary rehab.  4. OSA: I suspect OSA (snores, daytime sleepiness).  - Arrange for home sleep study.  5. Aortic dissection: S/p Type A aortic dissection, s/p Bentall with bioprosthetic aortic valve. Last echo in 2/20 showed stable bioprosthetic aortic valve.  Dr. Trula Slade has been following his residual dissected aorta.   - Given family history of aortic disease (uncle with aortic dissection, mother with SCD at age 64 - ?from dissection), his children's aortas need to be screened due to risk for inherited connective  tissue disorder.  We discussed this.  He is not Marfanoid.   6. Hyponatremia: Cut back on po fluid intake.   Followup 1 month.   [name] 06/07/2019

## 2019-06-07 NOTE — H&P (View-Only) (Signed)
PCP: Colon Branch, MD Cardiology: Dr. Caryl Comes HF Cardiology: Dr. Aundra Dubin  68 y.o. with history of COPD, paroxysmal atrial fibrillation, type A aortic dissection, and chronic diastolic CHF was referred by Dr. Caryl Comes for evaluation of CHF.  Prior to 2018, patient reports no significant limitations.  In 8/18, he had a Type A aortic dissection.  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  The dissection extended down to the abdominal aorta.  He was a heavy smoker, quit in 8/18.  He developed paroxysmal atrial fibrillation, had DCCV in 10/19.  He is on Eliquis and flecainide now.  Last echo in 2/20 showed EF 55%, mildly decreased RV systolic function, normally functioning bioprosthetic aortic valve.  PFTs in 6/19 showed moderate-severe obstruction.    Ever since his operation in 2018, Mr Gillig has had significant exertional dyspnea and has been quite limited.  He is currently short of breath walking about 30 feet (across his house). He has orthopnea, sleeps propped up in sleep number bed or in a recliner.  He is very limited, not able to do much more than walk around the house. Moderate exertion of any form wears him out.  He is fatigued during the day and snores, wife says he will stop breathing at night. No chest pain.  No palpitations, he is in NSR today.   Oxygen saturation dropped to 88% with exertion.   REDS clip 41%  ECG (10/20, personally reviewed): NSR, RBBB, LAFB  Labs (8/20): LDL 74 Labs (10/20): Na 126, K 4.5, creatinine 1.15  PMH: 1. COPD: PFTs (6/19) with moderate-severe obstruction.  Prior heavy smoker, quit 2018.  2. Atrial fibrillation: Paroxysmal. DCCV in 10/19, now on flecainide.  3. HTN 4. Chronic diastolic CHF: Echo (A999333) with EF 55%, mild LVH, mild LV dilation, mild RV dilation with mildly decreased systolic function, bioprosthetic aortic valve with mean gradient 15 mmHg.  5. Type A aortic dissection: 8/18, patient had  emergency repair with Bentall/bioprosthetic aortic valve.  Dissection extends down to abdominal aorta.  Coronaries re-implanted. Complicated by pericardial effusion requiring pericardiocentesis.   FH: Uncle with aortic dissection, mother with SCD at age 16.   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  Married, prior heavy smoker but quit in 2018.   ROS: All systems reviewed and negative except as per HPI.   Current Outpatient Medications  Medication Sig Dispense Refill  . albuterol (PROAIR HFA) 108 (90 Base) MCG/ACT inhaler Inhale 2 puffs into the lungs every 4 (four) hours as needed for wheezing or shortness of breath. 54 g 3  . albuterol (PROVENTIL) (2.5 MG/3ML) 0.083% nebulizer solution Take 3 mLs (2.5 mg total) by nebulization every 8 (eight) hours as needed for wheezing or shortness of breath. 150 mL 1  . ALPRAZolam (XANAX) 1 MG tablet TAKE 1-1.5 TABLETS (1-1.5 MG TOTAL) BY MOUTH AT BEDTIME. 40 tablet 0  . aspirin EC 81 MG tablet Take 1 tablet (81 mg total) by mouth daily.    Marland Kitchen augmented betamethasone dipropionate (DIPROLENE-AF) 0.05 % cream Apply 1 application topically 2 (two) times daily as needed (itchy skin.). 50 g 1  . azelastine (ASTELIN) 0.1 % nasal spray Place 2 sprays into both nostrils 2 (two) times daily as needed for allergies.    . diphenhydrAMINE (BENADRYL) 25 MG tablet Take 25 mg by mouth every 8 (eight) hours as needed (allergies/runny nose).     Marland Kitchen ELIQUIS 5 MG TABS tablet TAKE 1 TABLET TWICE A DAY 180 tablet  1  . escitalopram (LEXAPRO) 10 MG tablet Take 1 tablet (10 mg total) by mouth daily. 90 tablet 3  . esomeprazole (NEXIUM) 40 MG capsule Take 1 capsule (40 mg total) by mouth daily before breakfast. 90 capsule 3  . flecainide (TAMBOCOR) 50 MG tablet Take 1 tablet (50 mg total) by mouth 2 (two) times daily. 270 tablet 3  . fluticasone (FLONASE) 50 MCG/ACT nasal spray Place 1 spray into both nostrils 2 (two) times daily as needed for allergies. Use one spray in each nostril  twice daily.    . fluticasone furoate-vilanterol (BREO ELLIPTA) 200-25 MCG/INH AEPB Inhale 1 puff into the lungs at bedtime.    Marland Kitchen losartan (COZAAR) 25 MG tablet Take 1 tablet (25 mg total) by mouth daily. 30 tablet 1  . metoprolol tartrate (LOPRESSOR) 25 MG tablet Take 1 tablet (25 mg total) by mouth 2 (two) times daily. 180 tablet 3  . rosuvastatin (CRESTOR) 5 MG tablet Take 1 tablet (5 mg total) by mouth daily. 90 tablet 3  . umeclidinium bromide (INCRUSE ELLIPTA) 62.5 MCG/INH AEPB Inhale 1 puff into the lungs at bedtime.     . potassium chloride SA (KLOR-CON) 20 MEQ tablet Take 1 tablet (20 mEq total) by mouth daily. 90 tablet 3  . torsemide (DEMADEX) 20 MG tablet Take 2 tablets (40 mg total) by mouth daily. 180 tablet 3   No current facility-administered medications for this encounter.    BP (!) 127/41   Pulse (!) 57   Wt 96.4 kg (212 lb 9.6 oz)   SpO2 96%   BMI 28.83 kg/m  General: NAD Neck: JVP 8-9 cm with HJR, no thyromegaly or thyroid nodule.  Lungs: Bilateral rhonchi.  CV: Nondisplaced PMI.  Heart regular S1/S2, no S3/S4, 2/6 SEM RUSB.  1+ edema 3/4 to knees bilaterally.  No carotid bruit.  Normal pedal pulses.  Abdomen: Soft, nontender, no hepatosplenomegaly, no distention.  Skin: Intact without lesions or rashes.  Neurologic: Alert and oriented x 3.  Psych: Normal affect. Extremities: No clubbing or cyanosis.  HEENT: Normal.   Assessment/Plan: 1. Atrial fibrillation: Paroxysmal.  He is currently in NSR.  - Continue Eliquis 5 mg bid.  - Continue flecainide + metoprolol.  2. Chronic diastolic CHF: Echo in A999333 with EF 55%, mildly decreased RV systolic function, normally functioning bioprosthetic aortic valve. On exam, he does look volume overloaded and REDS clip measures 41% (elevated). NYHA class III.  I suspect that COPD also contributes to his exercise intolerance.  - Stop Lasix.  - Start torsemide 40 mg daily and KCl 10 daily.  BMET in 1 week.  - I am going to arrange  RHC to assess filling pressures more formally.  We discussed risks/benefits and he agrees to procedure.  3. COPD: Moderate to severe obstruction on 6/19 PFTs, prior smoker.  As above, I suspect this contributes to his dyspnea.  - I will refer him back to pulmonary (saw Dr. Vaughan Browner in the past).  - Refer to pulmonary rehab.  4. OSA: I suspect OSA (snores, daytime sleepiness).  - Arrange for home sleep study.  5. Aortic dissection: S/p Type A aortic dissection, s/p Bentall with bioprosthetic aortic valve. Last echo in 2/20 showed stable bioprosthetic aortic valve.  Dr. Trula Slade has been following his residual dissected aorta.   - Given family history of aortic disease (uncle with aortic dissection, mother with SCD at age 51 - ?from dissection), his children's aortas need to be screened due to risk for inherited connective  tissue disorder.  We discussed this.  He is not Marfanoid.   6. Hyponatremia: Cut back on po fluid intake.   Followup 1 month.   [name] 06/07/2019

## 2019-06-08 ENCOUNTER — Encounter (HOSPITAL_COMMUNITY): Payer: Self-pay | Admitting: *Deleted

## 2019-06-08 NOTE — Progress Notes (Signed)
Received referral from Dr. Aundra Dubin for this pt to participate in Pulmonary Rehab with the diagnosis of Diastolic Heart Failure.  Pt is known to rehab staff from brief previous participation in [place] in 2018. Clinical review of pt follow up appt on 10/27  Heart Failure clinic office note.  Pt EF on most recent echo shows 55%. Pt appropriate for scheduling for Pulmonary rehab to begin one week post right heart cath that is scheduled for 11/5.  Pt covid risk score is 5.   Will forward to support staff for scheduling and verification of insurance eligibility/benefits with pt  consent. Cherre Huger, BSN Cardiac and Training and development officer

## 2019-06-09 ENCOUNTER — Telehealth (HOSPITAL_COMMUNITY): Payer: Self-pay

## 2019-06-12 ENCOUNTER — Other Ambulatory Visit (HOSPITAL_COMMUNITY)
Admission: RE | Admit: 2019-06-12 | Discharge: 2019-06-12 | Disposition: A | Discharge status: Home / Self Care | Payer: Medicare Other | Source: Ambulatory Visit | Attending: Cardiology | Admitting: Cardiology

## 2019-06-12 DIAGNOSIS — Z01812 Encounter for preprocedural laboratory examination: Secondary | ICD-10-CM | POA: Insufficient documentation

## 2019-06-12 DIAGNOSIS — Z20828 Contact with and (suspected) exposure to other viral communicable diseases: Secondary | ICD-10-CM | POA: Insufficient documentation

## 2019-06-13 ENCOUNTER — Other Ambulatory Visit (HOSPITAL_COMMUNITY): Payer: Medicare Other

## 2019-06-13 LAB — NOVEL CORONAVIRUS, NAA (HOSP ORDER, SEND-OUT TO REF LAB; TAT 18-24 HRS): SARS-CoV-2, NAA: NOT DETECTED

## 2019-06-15 ENCOUNTER — Ambulatory Visit (HOSPITAL_COMMUNITY)
Admission: RE | Admit: 2019-06-15 | Discharge: 2019-06-15 | Disposition: A | Discharge status: Home / Self Care | Payer: Medicare Other | Attending: Cardiology | Admitting: Cardiology

## 2019-06-15 ENCOUNTER — Other Ambulatory Visit: Payer: Self-pay

## 2019-06-15 ENCOUNTER — Encounter (HOSPITAL_COMMUNITY): Payer: Self-pay | Admitting: Cardiology

## 2019-06-15 ENCOUNTER — Encounter (HOSPITAL_COMMUNITY)
Admission: RE | Disposition: A | Discharge status: Home / Self Care | Payer: Self-pay | Source: Home / Self Care | Attending: Cardiology

## 2019-06-15 DIAGNOSIS — I5032 Chronic diastolic (congestive) heart failure: Secondary | ICD-10-CM | POA: Insufficient documentation

## 2019-06-15 DIAGNOSIS — Z79899 Other long term (current) drug therapy: Secondary | ICD-10-CM | POA: Insufficient documentation

## 2019-06-15 DIAGNOSIS — E871 Hypo-osmolality and hyponatremia: Secondary | ICD-10-CM | POA: Diagnosis not present

## 2019-06-15 DIAGNOSIS — Z7982 Long term (current) use of aspirin: Secondary | ICD-10-CM | POA: Diagnosis not present

## 2019-06-15 DIAGNOSIS — I110 Hypertensive heart disease with heart failure: Secondary | ICD-10-CM | POA: Insufficient documentation

## 2019-06-15 DIAGNOSIS — Z7901 Long term (current) use of anticoagulants: Secondary | ICD-10-CM | POA: Insufficient documentation

## 2019-06-15 DIAGNOSIS — J449 Chronic obstructive pulmonary disease, unspecified: Secondary | ICD-10-CM | POA: Diagnosis not present

## 2019-06-15 DIAGNOSIS — I7100 Dissection of unspecified site of aorta: Secondary | ICD-10-CM | POA: Diagnosis not present

## 2019-06-15 DIAGNOSIS — I509 Heart failure, unspecified: Secondary | ICD-10-CM | POA: Diagnosis not present

## 2019-06-15 DIAGNOSIS — Z87891 Personal history of nicotine dependence: Secondary | ICD-10-CM | POA: Diagnosis not present

## 2019-06-15 DIAGNOSIS — I480 Paroxysmal atrial fibrillation: Secondary | ICD-10-CM | POA: Diagnosis not present

## 2019-06-15 HISTORY — PX: RIGHT HEART CATH: CATH118263

## 2019-06-15 LAB — POCT I-STAT EG7
Acid-Base Excess: 1 mmol/L (ref 0.0–2.0)
Bicarbonate: 25.4 mmol/L (ref 20.0–28.0)
Bicarbonate: 25.9 mmol/L (ref 20.0–28.0)
Calcium, Ion: 1.13 mmol/L — ABNORMAL LOW (ref 1.15–1.40)
Calcium, Ion: 1.17 mmol/L (ref 1.15–1.40)
HCT: 39 % (ref 39.0–52.0)
HCT: 40 % (ref 39.0–52.0)
Hemoglobin: 13.3 g/dL (ref 13.0–17.0)
Hemoglobin: 13.6 g/dL (ref 13.0–17.0)
O2 Saturation: 69 %
O2 Saturation: 71 %
Potassium: 3.9 mmol/L (ref 3.5–5.1)
Potassium: 4 mmol/L (ref 3.5–5.1)
Sodium: 128 mmol/L — ABNORMAL LOW (ref 135–145)
Sodium: 129 mmol/L — ABNORMAL LOW (ref 135–145)
TCO2: 27 mmol/L (ref 22–32)
TCO2: 27 mmol/L (ref 22–32)
pCO2, Ven: 41.8 mmHg — ABNORMAL LOW (ref 44.0–60.0)
pCO2, Ven: 42.4 mmHg — ABNORMAL LOW (ref 44.0–60.0)
pH, Ven: 7.392 (ref 7.250–7.430)
pH, Ven: 7.393 (ref 7.250–7.430)
pO2, Ven: 37 mmHg (ref 32.0–45.0)
pO2, Ven: 37 mmHg (ref 32.0–45.0)

## 2019-06-15 LAB — BASIC METABOLIC PANEL
Anion gap: 12 (ref 5–15)
BUN: 18 mg/dL (ref 8–23)
CO2: 27 mmol/L (ref 22–32)
Calcium: 9.2 mg/dL (ref 8.9–10.3)
Chloride: 88 mmol/L — ABNORMAL LOW (ref 98–111)
Creatinine, Ser: 1.28 mg/dL — ABNORMAL HIGH (ref 0.61–1.24)
GFR calc Af Amer: >60 mL/min (ref >60)
GFR calc non Af Amer: 57 mL/min — ABNORMAL LOW (ref >60)
Glucose, Bld: 95 mg/dL (ref 70–99)
Potassium: 3.8 mmol/L (ref 3.5–5.1)
Sodium: 127 mmol/L — ABNORMAL LOW (ref 135–145)

## 2019-06-15 SURGERY — RIGHT HEART CATH

## 2019-06-15 MED ORDER — HEPARIN (PORCINE) IN NACL 1000-0.9 UT/500ML-% IV SOLN
INTRAVENOUS | Status: AC
  Filled 2019-06-15: qty 500

## 2019-06-15 MED ORDER — SODIUM CHLORIDE 0.9% FLUSH: 3.0000 mL | INTRAVENOUS | Status: DC | PRN

## 2019-06-15 MED ORDER — SODIUM CHLORIDE 0.9 % IV SOLN: 250.0000 mL | INTRAVENOUS | Status: DC | PRN

## 2019-06-15 MED ORDER — ONDANSETRON HCL 4 MG/2ML IJ SOLN: 4.0000 mg | Freq: Four times a day (QID) | INTRAMUSCULAR | Status: DC | PRN

## 2019-06-15 MED ORDER — POTASSIUM CHLORIDE CRYS ER 20 MEQ PO TBCR: 40.0000 meq | EXTENDED_RELEASE_TABLET | Freq: Every day | ORAL | 3 refills | Status: DC

## 2019-06-15 MED ORDER — HEPARIN (PORCINE) IN NACL 1000-0.9 UT/500ML-% IV SOLN
INTRAVENOUS | Status: DC | PRN
  Administered 2019-06-15: 500 mL

## 2019-06-15 MED ORDER — LIDOCAINE HCL (PF) 1 % IJ SOLN
INTRAMUSCULAR | Status: DC | PRN
  Administered 2019-06-15: 2 mL

## 2019-06-15 MED ORDER — ASPIRIN 81 MG PO CHEW: 81.0000 mg | CHEWABLE_TABLET | ORAL | Status: DC

## 2019-06-15 MED ORDER — HYDRALAZINE HCL 20 MG/ML IJ SOLN: 10.0000 mg | INTRAMUSCULAR | Status: DC | PRN

## 2019-06-15 MED ORDER — LIDOCAINE HCL (PF) 1 % IJ SOLN
INTRAMUSCULAR | Status: AC
  Filled 2019-06-15: qty 30

## 2019-06-15 MED ORDER — SODIUM CHLORIDE 0.9 % IV SOLN
INTRAVENOUS | Status: AC | PRN
  Administered 2019-06-15: 10 mL/h via INTRAVENOUS

## 2019-06-15 MED ORDER — IOHEXOL 350 MG/ML SOLN
INTRAVENOUS | Status: DC | PRN
  Administered 2019-06-15: 5 mL

## 2019-06-15 MED ORDER — SODIUM CHLORIDE 0.9% FLUSH: 3.0000 mL | Freq: Two times a day (BID) | INTRAVENOUS | Status: DC

## 2019-06-15 MED ORDER — SODIUM CHLORIDE 0.9 % IV SOLN: INTRAVENOUS | Status: DC

## 2019-06-15 MED ORDER — ACETAMINOPHEN 325 MG PO TABS: 650.0000 mg | ORAL_TABLET | ORAL | Status: DC | PRN

## 2019-06-15 MED ORDER — TORSEMIDE 20 MG PO TABS: ORAL_TABLET | ORAL | 3 refills | Status: DC

## 2019-06-15 MED ORDER — LABETALOL HCL 5 MG/ML IV SOLN: 10.0000 mg | INTRAVENOUS | Status: DC | PRN

## 2019-06-15 SURGICAL SUPPLY — 9 items
CATH BALLN WEDGE 5F 110CM (CATHETERS) ×2 IMPLANT
GUIDEWIRE .025 260CM (WIRE) ×2 IMPLANT
PACK CARDIAC CATHETERIZATION (CUSTOM PROCEDURE TRAY) ×3 IMPLANT
PROTECTION STATION PRESSURIZED (MISCELLANEOUS) ×3
SHEATH GLIDE SLENDER 4/5FR (SHEATH) ×2 IMPLANT
STATION PROTECTION PRESSURIZED (MISCELLANEOUS) IMPLANT
TRANSDUCER W/STOPCOCK (MISCELLANEOUS) ×3 IMPLANT
TUBING ART PRESS 72  MALE/MALE (TUBING) ×2 IMPLANT
WIRE EMERALD 3MM-J .025X260CM (WIRE) ×2 IMPLANT

## 2019-06-15 NOTE — Interval H&P Note (Signed)
History and Physical Interval Note:  06/15/2019 9:57 AM  Sean Riley  has presented today for surgery, with the diagnosis of heart failure.  The various methods of treatment have been discussed with the patient and family. After consideration of risks, benefits and other options for treatment, the patient has consented to  Procedure(s): RIGHT HEART CATH (N/A) as a surgical intervention.  The patient's history has been reviewed, patient examined, no change in status, stable for surgery.  I have reviewed the patient's chart and labs.  Questions were answered to the patient's satisfaction.     Yitzhak Awan [employer]

## 2019-06-15 NOTE — Discharge Instructions (Signed)
Per Dr Aundra Dubin restart all meds / Dr Rockey Situ patient how to redose fluid pills Right heart cath//Venogram, Care After This sheet gives you information about how to care for yourself after your procedure. Your health care provider may also give you more specific instructions. If you have problems or questions, contact your health care provider. What can I expect after the procedure? After the procedure, it is common to have:  Bruising or mild discomfort in the area where the IV was inserted (insertion site). Follow these instructions at home: Eating and drinking   Follow instructions from your health care provider about eating or drinking restrictions.  Drink a lot of fluids for the first several days after the procedure, as directed by your health care provider. This helps to wash (flush) the contrast out of your body. Examples of healthy fluids include water or low-calorie drinks. General instructions  Check your IV insertion area every day for signs of infection. Check for: ? Redness, swelling, or pain. ? Fluid or blood. ? Warmth. ? Pus or a bad smell.  Take over-the-counter and prescription medicines only as told by your health care provider.  Rest and return to your normal activities as told by your health care provider. Ask your health care provider what activities are safe for you.  Do not drive for 24 hours if you were given a medicine to help you relax (sedative), or until your health care provider approves.  Keep all follow-up visits as told by your health care provider. This is important. Contact a health care provider if:  Your skin becomes itchy or you develop a rash or hives.  You have a fever that does not get better with medicine.  You feel nauseous.  You vomit.  You have redness, swelling, or pain around the insertion site.  You have fluid or blood coming from the insertion site.  Your insertion area feels warm to the touch.  You have pus or a bad smell coming  from the insertion site. Get help right away if:  You have difficulty breathing or shortness of breath.  You develop chest pain.  You faint.  You feel very dizzy. These symptoms may represent a serious problem that is an emergency. Do not wait to see if the symptoms will go away. Get medical help right away. Call your local emergency services (911 in the [place] Do not drive yourself to the hospital. Summary  After your procedure, it is common to have bruising or mild discomfort in the area where the IV was inserted.  You should check your IV insertion area every day for signs of infection.  Take over-the-counter and prescription medicines only as told by your health care provider.  You should drink a lot of fluids for the first several days after the procedure to help flush the contrast from your body. This information is not intended to replace advice given to you by your health care provider. Make sure you discuss any questions you have with your health care provider. Document Released: 05/17/2013 Document Revised: 07/09/2017 Document Reviewed: 06/20/2016 Elsevier Patient Education  2020 [employer].

## 2019-06-26 ENCOUNTER — Other Ambulatory Visit: Payer: Self-pay

## 2019-06-26 ENCOUNTER — Other Ambulatory Visit: Payer: Self-pay | Admitting: *Deleted

## 2019-06-26 DIAGNOSIS — F17210 Nicotine dependence, cigarettes, uncomplicated: Secondary | ICD-10-CM

## 2019-06-26 DIAGNOSIS — Z87891 Personal history of nicotine dependence: Secondary | ICD-10-CM

## 2019-06-26 DIAGNOSIS — Z122 Encounter for screening for malignant neoplasm of respiratory organs: Secondary | ICD-10-CM

## 2019-06-27 ENCOUNTER — Encounter: Payer: Self-pay | Admitting: Internal Medicine

## 2019-06-27 ENCOUNTER — Other Ambulatory Visit: Payer: Self-pay

## 2019-06-27 ENCOUNTER — Ambulatory Visit (INDEPENDENT_AMBULATORY_CARE_PROVIDER_SITE_OTHER): Payer: Medicare Other | Admitting: Internal Medicine

## 2019-06-27 VITALS — BP 129/46 | HR 62 | Temp 97.8°F | Resp 18 | Ht 72.0 in | Wt 205.1 lb

## 2019-06-27 DIAGNOSIS — I509 Heart failure, unspecified: Secondary | ICD-10-CM | POA: Diagnosis not present

## 2019-06-27 DIAGNOSIS — J449 Chronic obstructive pulmonary disease, unspecified: Secondary | ICD-10-CM

## 2019-06-27 DIAGNOSIS — I10 Essential (primary) hypertension: Secondary | ICD-10-CM | POA: Diagnosis not present

## 2019-06-27 MED ORDER — LOSARTAN POTASSIUM 25 MG PO TABS: 25.0000 mg | ORAL_TABLET | Freq: Every day | ORAL | 1 refills | Status: DC

## 2019-06-27 NOTE — Progress Notes (Signed)
Pre visit review using our clinic review tool, if applicable. No additional management support is needed unless otherwise documented below in the visit note. 

## 2019-06-27 NOTE — Progress Notes (Signed)
Subjective:    Patient ID: Sean Riley, male    DOB: Nov 13, 1950, 68 y.o.   MRN: [id number]  DOS:  06/27/2019 Type of visit - description: Follow-up Since the last office visit, he saw cardiology, medications were adjusted. Cough has essentially resolved No sputum production. Edema has decreased Is not drinking alcohol for the last several weeks. Has decreased his consumption of sodas  Wt Readings from Last 3 Encounters:  06/27/19 205 lb 2 oz (93 kg)  06/15/19 204 lb (92.5 kg)  06/06/19 212 lb 9.6 oz (96.4 kg)     Review of Systems Lower extremity edema definitely improved, he believes it is a combination of new medications and compression stockings  Past Medical History:  Diagnosis Date  . Allergy   . Arthritis   . Ascending aortic dissection ([place]) 03/23/2017  . Atrial fibrillation ([place])   . Cataract    removed both eyes   . GERD (gastroesophageal reflux disease)   . HYPERLIPIDEMIA   . 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 Hypertension   . Microscopic hematuria    negative cystoscopy  . NONSPECIFIC ABNORMAL ELECTROCARDIOGRAM   . Pleural effusion on right   . Pleural effusion, bilateral   . S/P aortic dissection repair 03/24/2017   Biological Bentall aortic root replacement + resection and grafting of entire ascending aorta, transverse aortic arch and proximal descending thoracic aorta with elephant trunk distal anastomosis and debranching of aortic arch vessels  . 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 Varicose veins     Past Surgical History:  Procedure Laterality Date  . APPENDECTOMY    . CARDIAC CATHETERIZATION      X3;Dr Caryl Comes, last 01/20/12: mild non-obstructive CAD, normal LV systolic function  . CARDIOVERSION N/A 05/31/2018   Procedure:  CARDIOVERSION;  Surgeon: Sueanne Margarita, MD;  Location: [facility] ENDOSCOPY;  Service: Cardiovascular;  Laterality: N/A;  . COLONOSCOPY    . CYSTOSCOPY  1978   Dr Hartley Barefoot  . EYE SURGERY    . IR THORACENTESIS ASP PLEURAL SPACE W/IMG GUIDE  11/25/2017  . KNEE ARTHROSCOPY  2012   Dr Theda Sers  . LUMBAR LAMINECTOMY  1987   Dr Durward Fortes  . MYELOGRAM  2007  . PERICARDIOCENTESIS N/A 04/07/2017   Procedure: PERICARDIOCENTESIS;  Surgeon: Sherren Mocha, MD;  Location: [facility] CV LAB;  Service: Cardiovascular;  Laterality: N/A;  . POLYPECTOMY    . REPAIR OF ACUTE ASCENDING THORACIC AORTIC DISSECTION N/A 03/23/2017   Procedure: REPAIR OF ACUTE ASCENDING THORACIC AORTIC DISSECTION.  Bentall procedure.  Aortic root repleacement with bioprosthetic valve.  Reimplantation of left and right coronary arteries.  Total resection of transverse aortic arch.  Elephant trunk distal anastomosis and debranching of arch vessels.;  Surgeon: Rexene Alberts, MD;  Location: [facility];  Service: Vascular;  Laterality: N/A;  . RIGHT HEART CATH N/A 06/15/2019   Procedure: RIGHT HEART CATH;  Surgeon: Larey Dresser, MD;  Location: [facility] CV LAB;  Service: Cardiovascular;  Laterality: N/A;  . ROTATOR CUFF REPAIR    . SHOULDER ARTHROSCOPY  2011   Dr Theda Sers  . SHOULDER ARTHROSCOPY Right 08/2015  . SPINAL FUSION  1986   Dr Rolin Barry  . TEE WITHOUT CARDIOVERSION N/A 03/23/2017   Procedure: TRANSESOPHAGEAL ECHOCARDIOGRAM (TEE);  Surgeon: Darylene Price  H, MD;  Location: [facility];  Service: Open Heart Surgery;  Laterality: N/A;  . THORACIC AORTIC ENDOVASCULAR STENT GRAFT N/A 03/30/2018   Procedure: THORACIC AORTIC ENDOVASCULAR STENT GRAFT WITH INTRAVASCULAR ULTRASOUND;  Surgeon: Serafina Mitchell, MD;  Location: [facility] OR;  Service: Vascular;  Laterality: N/A;  . TRACHEOSTOMY     age 37 for croup    Social History   Socioeconomic History  . Marital status: Married    Spouse name: Not on file  . Number of children: 2  . Years of  education: Not on file  . Highest education level: Not on file  Occupational History  . 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 Financial resource strain: Not on file  . Food insecurity    Worry: Not on file    Inability: Not on file  . Transportation needs    Medical: Not on file    Non-medical: Not on file  Tobacco Use  . Smoking status: Former Smoker    Packs/day: 2.00    Years: 45.00    Pack years: 90.00    Types: Cigarettes    Quit date: 03/22/2017    Years since quitting: 2.2  . Smokeless tobacco: Never Used  . Tobacco comment: 2 ppd , quit 03/2017  Substance and Sexual Activity  . Alcohol use: Yes    Alcohol/week: 0.0 standard drinks    Comment: 5 cans of beer daily  . Drug use: No  . Sexual activity: Never  Lifestyle  . Physical activity    Days per week: Not on file    Minutes per session: Not on file  . Stress: Not on file  Relationships  . Social Herbalist on phone: Not on file    Gets together: Not on file    Attends religious service: Not on file    Active member of club or organization: Not on file    Attends meetings of clubs or organizations: Not on file    Relationship status: Not on file  . Intimate partner violence    Fear of current or ex partner: Not on file    Emotionally abused: Not on file    Physically abused: Not on file    Forced sexual activity: Not on file  Other Topics Concern  . Not on file  Social History Narrative   Lives w/ wife      Allergies as of 06/27/2019      Reactions   Simvastatin Other (See Comments)   Mental status changes   Celecoxib Other (See Comments)   GI UPSET AND INFLAMMATION   Codeine Other (See Comments)   GI UPSET AND INFLAMMATION   Nsaids Other (See Comments)   GI UPSET AND INFLAMMATION (can tolerate via IV)   Tape Other (See Comments)   Medical tape and Band-Aids PULL OFF THE SKIN; please use Coban wrap   Cephalexin Itching, Rash   Latex Rash      Medication List        Accurate as of June 27, 2019 11:59 PM. If you have any questions, ask your nurse or doctor.        albuterol (2.5 MG/3ML) 0.083% nebulizer solution Commonly known as: PROVENTIL Take 3 mLs (2.5 mg total) by nebulization every 8 (eight) hours as needed for wheezing or shortness of breath.   albuterol 108 (90 Base) MCG/ACT inhaler Commonly known as: ProAir HFA Inhale 2 puffs into the lungs every 4 (four) hours as  needed for wheezing or shortness of breath.   ALPRAZolam 1 MG tablet Commonly known as: XANAX TAKE 1-1.5 TABLETS (1-1.5 MG TOTAL) BY MOUTH AT BEDTIME.   aspirin EC 81 MG tablet Take 1 tablet (81 mg total) by mouth daily.   augmented betamethasone dipropionate 0.05 % cream Commonly known as: DIPROLENE-AF Apply 1 application topically 2 (two) times daily as needed (itchy skin.).   azelastine 0.1 % nasal spray Commonly known as: ASTELIN Place 2 sprays into both nostrils 2 (two) times daily as needed for allergies.   diphenhydrAMINE 25 MG tablet Commonly known as: BENADRYL Take 25 mg by mouth every 8 (eight) hours as needed (allergies/runny nose).   Eliquis 5 MG Tabs tablet Generic drug: apixaban TAKE 1 TABLET TWICE A DAY   escitalopram 10 MG tablet Commonly known as: LEXAPRO Take 1 tablet (10 mg total) by mouth daily.   esomeprazole 40 MG capsule Commonly known as: NexIUM Take 1 capsule (40 mg total) by mouth daily before breakfast.   flecainide 50 MG tablet Commonly known as: TAMBOCOR Take 1 tablet (50 mg total) by mouth 2 (two) times daily.   fluticasone 50 MCG/ACT nasal spray Commonly known as: FLONASE Place 1 spray into both nostrils 2 (two) times daily as needed for allergies. Use one spray in each nostril twice daily.   fluticasone furoate-vilanterol 200-25 MCG/INH Aepb Commonly known as: Breo Ellipta Inhale 1 puff into the lungs at bedtime.   Incruse Ellipta 62.5 MCG/INH Aepb Generic drug: umeclidinium bromide Inhale 1 puff into the lungs at  bedtime.   losartan 25 MG tablet Commonly known as: COZAAR Take 1 tablet (25 mg total) by mouth daily.   metoprolol tartrate 25 MG tablet Commonly known as: LOPRESSOR Take 1 tablet (25 mg total) by mouth 2 (two) times daily.   potassium chloride SA 20 MEQ tablet Commonly known as: KLOR-CON Take 2 tablets (40 mEq total) by mouth daily.   rosuvastatin 5 MG tablet Commonly known as: Crestor Take 1 tablet (5 mg total) by mouth daily.   torsemide 20 MG tablet Commonly known as: DEMADEX Take 2 tablets (40 mg total) by mouth every other day AND 3 tablets (60 mg total) every other day.           Objective:   Physical Exam BP (!) 129/46 (BP Location: Left Arm, Patient Position: Sitting, Cuff Size: Small)   Pulse 62   Temp 97.8 F (36.6 C) (Temporal)   Resp 18   Ht 6' (1.829 m)   Wt 205 lb 2 oz (93 kg)   SpO2 95%   BMI 27.82 kg/m  General:   Well developed, NAD, BMI noted. HEENT:  Normocephalic . Face symmetric, atraumatic Lungs:  CTA B Normal respiratory effort, no intercostal retractions, no accessory muscle use. Heart: RRR, significant systolic murmur.  Trace pretibial edema bilaterally  Skin: Not pale. Not jaundice Neurologic:  alert & oriented X3.  Speech normal, gait appropriate for age   Psych--  Cognition and judgment appear intact.  Cooperative with normal attention span and concentration.  Behavior appropriate. No anxious or depressed appearing.      Assessment      Assessment  HTN Hyperlipidemia GERD Insomnia: xanax prn EtOH MSK: See surgeries Pulmonary  --COPD, s/p  PFTs 12/2013; 2020: Moderate to severe --Smoker, quit 03/2017 (2PPD) --CXR 12/2013 granuloma Allergies-- Dr Neldon Mc  Snoring: Reports a remote (1990s) + sleep study. Tried a cpap. [place] (-) 07-2015 CV: --P- Atrial fibrillation Dr Caryl Comes, on NSRb --lexiscan 2013 --Cardiac  catheterization July 2013: Normal LV FX,mild nonobstructive CAD Dr. Angelena Form --thoracis Ao  dissection ,  acute, surgery 03-2017 --Aortic thoracic aneurysm endovascular stent graft 03-2018 GU:  --BPH, microscopic hematuria (negative cystoscopy 1978) --Elevated PSA: saw urology  04-2016 Gynecomastia, Dr. Loanne Drilling  >> Bx (-) 10-2014 ; took tamoxifen temporarily (self d/c 06-2016), improved Hypogonadism-- dx per Dr Loanne Drilling, numbers improved d/t nolvadex  Sees dermatology : eczema? rx taclonex H/o  Tracheostomy , age 68, croup Chronic anemia: Chronic anemia per chart review,+ iron deficient (08-2018). S/p  colonoscopy 05-2016, multiple polyps. EGD 2014 for chest pain: Negative, bx results?   PLAN: HTN: Controlled  CHF: Seen by Dr. Aundra Dubin 06/06/2019, switch from Lasix to torsemide, RHC performed.  He seems to be doing great.  Current medications are: Losartan, metoprolol, potassium, Demadex.  Has follow-ups with cardiology already scheduled. COPD: Cough has decreased since the last time I saw him, likely due to better fluid status and stopping ACE inhibitors.  Cardiology is recommending to see pulmonary, refer back to Dr. Vaughan Browner. RTC 6 months

## 2019-06-27 NOTE — Progress Notes (Signed)
Cardiology Office Note Date:  06/27/2019  Patient ID:  Kenderick, Ketelsen 1950/11/10, MRN [id number] PCP:  Colon Branch, MD  Electrophysiologist; Dr. Caryl Comes  Chief Complaint:  scheduled f/u  History of Present Illness: BRNADON KIRSCHENMANN is a 68 y.o. 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) w/adult lifelong heavy smoking, and AFib maintained on flecainide  03/24/17  Emergency Repair of Aortic Dissection              Right axillary artery cannulation             Deep hypothermia and total circulatory arrest with continuous antegrade cerebral perfusion             Resection and grafting of ascending aorta, transverse aortic arch and proximal descending thoracic aorta             Elephant trunk distal anastomosis             Debranching of aortic arch vessles            Biological Bentall Aortic Root Replacement             [employer] Magna Ease Pericardial Tissue Valve (size 25 mm, model # 3300TFX, serial # [id number])             Gelweave Valsalva Aortic Root Graft (size 28 mm, ref # [id number], serial # [id number])             Reimplantation of Left Main and Right Coronary Arteries  He had DDCV Oct 2019.  He comes in today to be seen for Dr. Caryl Comes.  Last seen by him in Dec 2019, no changes were made at that visit, noting hx of difficulty with volume OL, suspected a component of dietary indiscretions.  I saw him 04/11/2019, he was accompanied by his wife.  He denied any CP, palpitations, no dizzy spells, no near syncope or syncope.  He never felt like he breathed well.  This dated back to the time of his dissection, waxes/wanes but feels like generally is easily winded.  No rest SOB.   His PMD saw him 8/20, suspected combined issue with his COPD, though perhaps more fluid OL.  He noted history of hyponatremia, cautiously increased his lasix. With plans to see him here early, and had f/u with him as well in a couple weeks.  He is wearing support stockings with some  improvement in his edema as well, he had noted in the AM his swelling is generally better then it is at the end of the day.  His wife had been monitoring his weights have drifted down about 3 lbs, not going back up yet, seems to have landed at 210lbs by his home scale the last few days.  She was getting SBP pretty consistently in the 150's  Case was reviewed with Dr. Caryl Comes, suspect that his DOE was multifactorial with significant COPD as well as fluid OL noting edema, given his hyponatremia his furosemide (and K+)  reduced to QOD dosing, added lisinopril for BP and with research, Dr. Caryl Comes found ACE-I could help improve hyponatremia.  Also noted that his ECG intervals, PR was progressively longer over the last few EKGs and his flecainide reduced to 75mg  BID.  He saw Dr. Larose Kells in f/u 9/17, unfortunately with the ACE his cough worsened significantly and was switched to losartan 25mg  daily.  Counseled on his fluid intake (3 beers daily and 4-5 sodas daily) with plans for labs (  10 days) and follow up in a month.   05/18/2019 He was accompanied by his wife. All in all, both felt perhaps somewhat improved from his last visit. His wife remarked how his health /physical decline he had after his dissection.  They both had a hard time deciding weather or not his cough is better, worse or the same after change to the losartan from the lisinopril.  Dr. Ethel Rana not mentions with the addition of the ACE his cough was clearly worse.   It was very difficult to get a clear picture of how he was doing, he defered to his wife for answers or confirmation of his answers. His weight was down 8 lbs, and his LE edema looks slightly improved to me.  His wife agreed, his swelling was better and thinks the compression/support stockings have as well helped quite a bit. No CP Planned for labs, CXR and referred to AHF team.  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  He saw Dr. Aundra Dubin, lasix changed to torsemide, referred to pulm and pulm rehab, sleep study, recommended his children get evaluated given dissection, underwent RHC mildly elevated RH pressures, normal wedge, suspected mild p.HTN 2/2 COPD, possible OSA.  His torsemide was increased.  He tells me he feels "about the same", "maybe a little better.  Constipated.  No CP, palpitations.  No rest SOB.  He is uncertain if his swelling is any better, but does say the his urine output is much more then it was after the change in his medicine. His wife says he is night and day better.  He no longer coughs, sleeping better, breathing easier.  Still winded with ambulation. He denies any CP, no dizzy spells, near syncope or syncope.  He saw his PMD yesterday and is being referred to pulmonology, sees Dr. Aundra Dubin in a couple weeks and has an appt with Dr. Caryl Comes in Jan.    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[place]) 03/23/2017   Atrial fibrillation ([place])    Cataract    removed both eyes    GERD (gastroesophageal reflux disease)    HYPERLIPIDEMIA    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 LUTS    Hypertension    Microscopic hematuria    negative cystoscopy   NONSPECIFIC ABNORMAL ELECTROCARDIOGRAM    Pleural effusion on right    Pleural effusion, bilateral    S/P aortic dissection repair 03/24/2017   Biological Bentall aortic root replacement + resection and grafting of entire ascending aorta, transverse aortic arch and proximal descending thoracic aorta with elephant trunk distal anastomosis and debranching of aortic arch vessels   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   25 mm [facility] Ease bovine pericardial tissue valve and 28 mm Gelweave Valsalva aortic root graft with reimplantation of left main and right coronary arteries   Varicose veins     Past  Surgical History:  Procedure Laterality Date   APPENDECTOMY     CARDIAC CATHETERIZATION      X3;Dr Caryl Comes, last 01/20/12: mild non-obstructive CAD, normal LV systolic function   CARDIOVERSION N/A 05/31/2018   Procedure: CARDIOVERSION;  Surgeon: Sueanne Margarita, MD;  Location: [facility];  Service: Cardiovascular;  Laterality: N/A;   COLONOSCOPY     CYSTOSCOPY  1978   Dr Hartley Barefoot   EYE  SURGERY     IR THORACENTESIS ASP PLEURAL SPACE W/IMG GUIDE  11/25/2017   KNEE ARTHROSCOPY  2012   Dr Theda Sers   LUMBAR LAMINECTOMY  1987   Dr Lestine Box  2007   PERICARDIOCENTESIS N/A 04/07/2017   Procedure: PERICARDIOCENTESIS;  Surgeon: Sherren Mocha, MD;  Location: [place] [facility];  Service: Cardiovascular;  Laterality: N/A;   POLYPECTOMY     REPAIR OF ACUTE ASCENDING THORACIC AORTIC DISSECTION N/A 03/23/2017   Procedure: REPAIR OF ACUTE ASCENDING THORACIC AORTIC DISSECTION.  Bentall procedure.  Aortic root repleacement with bioprosthetic valve.  Reimplantation of left and right coronary arteries.  Total resection of transverse aortic arch.  Elephant trunk distal anastomosis and debranching of arch vessels.;  Surgeon: Rexene Alberts, MD;  Location: [place];  Service: Vascular;  Laterality: N/A;   RIGHT HEART CATH N/A 06/15/2019   Procedure: RIGHT HEART CATH;  Surgeon: Larey Dresser, MD;  Location: [place] [facility];  Service: Cardiovascular;  Laterality: N/A;   ROTATOR CUFF REPAIR     SHOULDER ARTHROSCOPY  2011   Dr Theda Sers   SHOULDER ARTHROSCOPY Right 08/2015   SPINAL FUSION  1986   Dr Rolin Barry   TEE WITHOUT CARDIOVERSION N/A 03/23/2017   Procedure: TRANSESOPHAGEAL ECHOCARDIOGRAM (TEE);  Surgeon: Rexene Alberts, MD;  Location: [place];  Service: Open Heart Surgery;  Laterality: N/A;   THORACIC AORTIC ENDOVASCULAR STENT GRAFT N/A 03/30/2018   Procedure: THORACIC AORTIC ENDOVASCULAR STENT GRAFT WITH INTRAVASCULAR ULTRASOUND;  Surgeon: Serafina Mitchell, MD;  Location: [facility];  Service: Vascular;  Laterality: N/A;   TRACHEOSTOMY     age 69 for croup    Current Outpatient Medications  Medication Sig Dispense Refill   albuterol (PROAIR HFA) 108 (90 Base) MCG/ACT inhaler Inhale 2 puffs into the lungs every 4 (four) hours as needed for wheezing or shortness of breath. 54 g 3   albuterol (PROVENTIL) (2.5 MG/3ML) 0.083% nebulizer solution Take 3 mLs (2.5 mg total) by nebulization every 8 (eight) hours as needed for wheezing or shortness of breath. 150 mL 1   ALPRAZolam (XANAX) 1 MG tablet TAKE 1-1.5 TABLETS (1-1.5 MG TOTAL) BY MOUTH AT BEDTIME. 40 tablet 0   aspirin EC 81 MG tablet Take 1 tablet (81 mg total) by mouth daily.     augmented betamethasone dipropionate (DIPROLENE-AF) 0.05 % cream Apply 1 application topically 2 (two) times daily as needed (itchy skin.). 50 g 1   azelastine (ASTELIN) 0.1 % nasal spray Place 2 sprays into both nostrils 2 (two) times daily as needed for allergies.     diphenhydrAMINE (BENADRYL) 25 MG tablet Take 25 mg by mouth every 8 (eight) hours as needed (allergies/runny nose).      ELIQUIS 5 MG TABS tablet TAKE 1 TABLET TWICE A DAY 180 tablet 1   escitalopram (LEXAPRO) 10 MG tablet Take 1 tablet (10 mg total) by mouth daily. 90 tablet 3   esomeprazole (NEXIUM) 40 MG capsule Take 1 capsule (40 mg total) by mouth daily before breakfast. 90 capsule 3   flecainide (TAMBOCOR) 50 MG tablet Take 1 tablet (50 mg total) by mouth 2 (two) times daily. 270 tablet 3   fluticasone (FLONASE) 50 MCG/ACT nasal spray Place 1 spray into both nostrils 2 (two) times daily as needed for allergies. Use one spray in each nostril twice daily.     fluticasone furoate-vilanterol (BREO ELLIPTA) 200-25 MCG/INH AEPB Inhale 1 puff into the lungs at bedtime.     losartan (  COZAAR) 25 MG tablet Take 1 tablet (25 mg total) by mouth daily. 30 tablet 1   metoprolol tartrate (LOPRESSOR) 25 MG tablet Take 1 tablet (25 mg total) by mouth 2 (two) times daily. 180  tablet 3   potassium chloride SA (KLOR-CON) 20 MEQ tablet Take 2 tablets (40 mEq total) by mouth daily. 90 tablet 3   rosuvastatin (CRESTOR) 5 MG tablet Take 1 tablet (5 mg total) by mouth daily. 90 tablet 3   torsemide (DEMADEX) 20 MG tablet Take 2 tablets (40 mg total) by mouth every other day AND 3 tablets (60 mg total) every other day. 180 tablet 3   umeclidinium bromide (INCRUSE ELLIPTA) 62.5 MCG/INH AEPB Inhale 1 puff into the lungs at bedtime.      No current facility-administered medications for this visit.     Allergies:   Simvastatin, Celecoxib, Codeine, Nsaids, Tape, Cephalexin, and Latex   Social History:  The patient  reports that he quit smoking about 2 years ago. His smoking use included cigarettes. He has a 90.00 pack-year smoking history. He has never used smokeless tobacco. He reports current alcohol use. He reports that he does not use drugs.   Family History:  The patient's family history includes Arrhythmia in his brother; Benign prostatic hyperplasia in his father; Breast cancer in his maternal grandmother; Heart attack in his maternal aunt and maternal grandmother; Hypertension in his father and mother.  ROS:  Please see the history of present illness. All other systems are reviewed and otherwise negative.   PHYSICAL EXAM:  VS:  There were no vitals taken for this visit. BMI: There is no height or weight on file to calculate BMI. Well nourished, well developed, in no acute distress  HEENT: normocephalic, atraumatic  Neck: no JVD, carotid bruits or masses Cardiac: RRR; 2/6SM, no rubs, or gallops, heart sounds are easily heard, not distant Lungs: CTA b/l , no wheezing, rhonchi or rales  Abd: soft, nontender MS: no deformity or atrophy Ext:  Trace -1+ edema Skin: warm and dry, no rash Neuro:  No gross deficits appreciated Psych: euthymic mood, full affect   EKG:  Done today and reviewed by myself shows : SR, RBBB, LAD, measured PR 241ms, QRS 19ms, QT 475ms,  QTc 479ms  05/18/2019: SR 64, 1st degree AVblock, manually measured 233ms, RBBB, LAD, unchanged from last 04/11/2019: SB 58bpm, RBBB, LAD, 1st degree AVblock 252ms (measured), QRS 1107ms   06/15/2019: RHC 1. Mildly elevated PA pressure and right-sided pressures.  Normal PCWP.  2. Suspect mild pulmonary hypertension due to COPD and possibly OSA.   I will have him increase torsemide to 40 mg daily alternating with 60 mg daily.     09/23/2018: TTE IMPRESSIONS 1. The left ventricle has a visually estimated ejection fraction of of 55%. The cavity size was mildly dilated. There is mildly increased left ventricular wall thickness. Left ventricular diastolic Doppler parameters are consistent with  pseudonormalization Elevated left ventricular end-diastolic pressure No evidence of left ventricular regional wall motion abnormalities.  2. The right ventricle has mildly reduced systolic function. The cavity was mildly enlarged. There is no increase in right ventricular wall thickness.  3. Left atrial size was severely dilated.  4. Right atrial size was moderately dilated.  5. The mitral valve is normal in structure. No evidence of mitral valve stenosis. No regurgitation.  6. A 31mm an [facility] Ease pericardial tissue valve is present in the aortic position. No aortic insufficiency. Mean gradient 15 mmHg across the bioprosthetic  valve, not significantly elevated.  7. The pulmonic valve was normal in structure.  8. The aortic root and ascending aorta are normal in size and structure.  9. The inferior vena cava was dilated in size with >50% respiratory variability. 10. No evidence of left ventricular regional wall motion abnormalities. 11. Right atrial pressure is estimated at 8 mmHg. 12. PA systolic pressure 46 mmHg. 13. The tricuspid valve is normal in structure.  FINDINGS  Left Ventricle: The left ventricle has a visually estimated ejection fraction of of 55%. The cavity size was mildly dilated.  There is mildly increased left ventricular wall thickness. Left ventricular diastolic Doppler parameters are consistent with  pseudonormalization Elevated left ventricular end-diastolic pressure No evidence of left ventricular regional wall motion abnormalities.. Right Ventricle: The right ventricle has mildly reduced systolic function. The cavity was mildly enlarged. There is no increase in right ventricular wall thickness. Left Atrium: left atrial size was severely dilated Right Atrium: right atrial size was moderately dilated Right atrial pressure is estimated at 8 mmHg. Interatrial Septum: No atrial level shunt detected by color flow Doppler. Pericardium: There is no evidence of pericardial effusion. Mitral Valve: The mitral valve is normal in structure. Mitral valve regurgitation is not visualized by color flow Doppler. No evidence of mitral valve stenosis. Tricuspid Valve: The tricuspid valve is normal in structure. Tricuspid valve regurgitation is trivial by color flow Doppler. Aortic Valve: The aortic valve has been repaired/replaced Aortic valve regurgitation was not visualized by color flow Doppler. A 65mm [facility] Ease pericardial tissue valve is present in the aortic position. Pulmonic Valve: The pulmonic valve was normal in structure. Pulmonic valve regurgitation is not visualized by color flow Doppler. Aorta: The aortic root and ascending aorta are normal in size and structure. Venous: The inferior vena cava is dilated in size with greater than 50% respiratory variability.   04/08/17: limited echo (post-pericardiocentesis) Study Conclusions - Left ventricle: The cavity size was normal. There was mild   hypertrophy of the septum with otherwise mild concentric   hypertrophy. Systolic function was normal. The estimated ejection   fraction was in the range of 50% to 55%. Wall motion was normal;   there were no regional wall motion abnormalities. - Aortic valve: Transvalvular  velocity was within the normal range.   There was no stenosis. There was no regurgitation. - Mitral valve: Transvalvular velocity was within the normal range.   There was no evidence for stenosis. There was no regurgitation. - Left atrium: The atrium was moderately dilated. - Right ventricle: The cavity size was normal. Wall thickness was   normal. Systolic function was normal. - Right atrium: The atrium was moderately dilated. - Tricuspid valve: There was no regurgitation. - Pericardium, extracardiac: A small to moderate pericardial   effusion was identified anterior to the heart. Measures up to   1.6cm. There was no evidence of hemodynamic compromise. There was   a left pleural effusion.  04/06/17: Limited echo (pre-pericardiocentesis) Study Conclusions - Left ventricle: The cavity size was normal. Wall thickness was   normal. Systolic function was mildly to moderately reduced. The   estimated ejection fraction was in the range of 40% to 45%.   Diffuse hypokinesis. Doppler parameters are consistent with   abnormal left ventricular relaxation (grade 1 diastolic   dysfunction). - Aortic valve: A bioprosthesis was present and functioning   normally. - Pericardium, extracardiac: A moderate to large pericardial   effusion was identified circumferential to the heart. There was  no evidence of hemodynamic compromise. Impressions: - No significant change in pericardial effusion when compared to   prior study.   07/25/15: lexiscan stress Study Highlights   The left ventricular ejection fraction is mildly decreased (45-54%).  Nuclear stress EF: 48%.  There was no ST segment deviation noted during stress.  This is an intermediate risk study.   There is no ischemia.  There is diaphragmatic attenuation similar to prior study of 12/29/11. Since the prior study of 12/29/11, the EF has decreased from 58% to 48%.  The current study does not reveal any segmental wall motion abnormalities.      Recent Labs: 03/30/2019: ALT 9 06/06/2019: Platelets 237 06/15/2019: BUN 18; Creatinine, Ser 1.28; Hemoglobin 13.3; Hemoglobin 13.6; Potassium 3.9; Potassium 4.0; Sodium 129; Sodium 128  03/30/2019: Cholesterol 140; HDL 46.00; LDL Cholesterol 74; Total CHOL/HDL Ratio 3; Triglycerides 97.0; VLDL 19.4   Estimated Creatinine Clearance: 59.8 mL/min (A) (by C-G formula based on SCr of 1.28 mg/dL (H)).   Wt Readings from Last 3 Encounters:  06/15/19 204 lb (92.5 kg)  06/06/19 212 lb 9.6 oz (96.4 kg)  05/18/19 208 lb (94.3 kg)     Other studies reviewed: Additional studies/records reviewed today include: summarized above  ASSESSMENT AND PLAN:  1. Paroxysmal AFib     CHA2DS2Vasc is at least 2,  on Eliquis, appropriately dosed     On flecainide and metoprolol     Intervals improved  2. 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 Bioprosthetic AVR     Feb 2020 echo noted above     Follows with Dr. Trula Slade       4. HTN     No change today  5. Chronic CHF 6. HFpEF     Chronic hyponatremia      Much improvement with current regime     Appreciate AHF greatly      C/w Dr. Aundra Dubin   Disposition: Discussed we could move Dr. Olin Pia visit out some, though they want to keep it.    Current medicines are reviewed at length with the patient today.  The patient did not have any concerns regarding medicines.    Haywood Lasso, PA-C 06/27/2019 6:10 AM     [facility] [address] [place] [address] [phone] (office)  [phone] (fax)

## 2019-06-27 NOTE — Patient Instructions (Addendum)
Please schedule Medicare Wellness with Glenard Haring.   GO TO THE FRONT DESK Schedule your next appointment   For a check up in 6 months

## 2019-06-28 NOTE — Assessment & Plan Note (Signed)
HTN: Controlled  CHF: Seen by Dr. Aundra Dubin 06/06/2019, switch from Lasix to torsemide, RHC performed.  He seems to be doing great.  Current medications are: Losartan, metoprolol, potassium, Demadex.  Has follow-ups with cardiology already scheduled. COPD: Cough has decreased since the last time I saw him, likely due to better fluid status and stopping ACE inhibitors.  Cardiology is recommending to see pulmonary, refer back to Dr. Vaughan Browner. RTC 6 months

## 2019-06-29 ENCOUNTER — Ambulatory Visit (INDEPENDENT_AMBULATORY_CARE_PROVIDER_SITE_OTHER): Payer: Medicare Other | Admitting: Physician Assistant

## 2019-06-29 ENCOUNTER — Other Ambulatory Visit: Payer: Self-pay

## 2019-06-29 VITALS — BP 144/68 | HR 61 | Ht 72.0 in | Wt 207.0 lb

## 2019-06-29 DIAGNOSIS — I480 Paroxysmal atrial fibrillation: Secondary | ICD-10-CM | POA: Diagnosis not present

## 2019-06-29 DIAGNOSIS — I10 Essential (primary) hypertension: Secondary | ICD-10-CM

## 2019-06-29 DIAGNOSIS — I5032 Chronic diastolic (congestive) heart failure: Secondary | ICD-10-CM

## 2019-06-29 NOTE — Patient Instructions (Addendum)
Medication Instructions:  Your physician recommends that you continue on your current medications as directed. Please refer to the Current Medication list given to you today.]   *If you need a refill on your cardiac medications before your next appointment, please call your pharmacy*  Lab Work:  If you have labs (blood work) drawn today and your tests are completely normal, you will receive your results only by: Marland Kitchen MyChart Message (if you have MyChart) OR . A paper copy in the mail If you have any lab test that is abnormal or we need to change your treatment, we will call you to review the results.  Testing/Procedures: NONE ORDERED  TODAY    Follow-Up: At [facility], you and your health needs are our priority.  As part of our continuing mission to provide you with exceptional heart care, we have created designated Provider Care Teams.  These Care Teams include your primary Cardiologist (physician) and Advanced Practice Providers (APPs -  Physician Assistants and Nurse Practitioners) who all work together to provide you with the care you need, when you need it.  Your next appointment:  AS SCHEDULED    Other Instructions

## 2019-07-11 ENCOUNTER — Encounter (HOSPITAL_COMMUNITY): Payer: Self-pay | Admitting: Cardiology

## 2019-07-11 ENCOUNTER — Other Ambulatory Visit: Payer: Self-pay

## 2019-07-11 ENCOUNTER — Ambulatory Visit (HOSPITAL_COMMUNITY)
Admission: RE | Admit: 2019-07-11 | Discharge: 2019-07-11 | Disposition: A | Discharge status: Home / Self Care | Payer: Medicare Other | Source: Ambulatory Visit | Attending: Cardiology | Admitting: Cardiology

## 2019-07-11 VITALS — BP 142/54 | HR 75 | Wt 207.6 lb

## 2019-07-11 DIAGNOSIS — G4733 Obstructive sleep apnea (adult) (pediatric): Secondary | ICD-10-CM | POA: Insufficient documentation

## 2019-07-11 DIAGNOSIS — Z7901 Long term (current) use of anticoagulants: Secondary | ICD-10-CM | POA: Insufficient documentation

## 2019-07-11 DIAGNOSIS — I440 Atrioventricular block, first degree: Secondary | ICD-10-CM | POA: Diagnosis not present

## 2019-07-11 DIAGNOSIS — I110 Hypertensive heart disease with heart failure: Secondary | ICD-10-CM | POA: Diagnosis not present

## 2019-07-11 DIAGNOSIS — I480 Paroxysmal atrial fibrillation: Secondary | ICD-10-CM | POA: Diagnosis not present

## 2019-07-11 DIAGNOSIS — J449 Chronic obstructive pulmonary disease, unspecified: Secondary | ICD-10-CM | POA: Diagnosis not present

## 2019-07-11 DIAGNOSIS — E871 Hypo-osmolality and hyponatremia: Secondary | ICD-10-CM | POA: Diagnosis not present

## 2019-07-11 DIAGNOSIS — Z79899 Other long term (current) drug therapy: Secondary | ICD-10-CM | POA: Insufficient documentation

## 2019-07-11 DIAGNOSIS — I5032 Chronic diastolic (congestive) heart failure: Secondary | ICD-10-CM | POA: Insufficient documentation

## 2019-07-11 DIAGNOSIS — Z87891 Personal history of nicotine dependence: Secondary | ICD-10-CM | POA: Diagnosis not present

## 2019-07-11 DIAGNOSIS — Z7951 Long term (current) use of inhaled steroids: Secondary | ICD-10-CM | POA: Diagnosis not present

## 2019-07-11 DIAGNOSIS — Z7982 Long term (current) use of aspirin: Secondary | ICD-10-CM | POA: Insufficient documentation

## 2019-07-11 DIAGNOSIS — I4510 Unspecified right bundle-branch block: Secondary | ICD-10-CM | POA: Insufficient documentation

## 2019-07-11 LAB — BASIC METABOLIC PANEL
Anion gap: 11 (ref 5–15)
BUN: 18 mg/dL (ref 8–23)
CO2: 27 mmol/L (ref 22–32)
Calcium: 9.3 mg/dL (ref 8.9–10.3)
Chloride: 93 mmol/L — ABNORMAL LOW (ref 98–111)
Creatinine, Ser: 1.61 mg/dL — ABNORMAL HIGH (ref 0.61–1.24)
GFR calc Af Amer: 50 mL/min — ABNORMAL LOW (ref >60)
GFR calc non Af Amer: 43 mL/min — ABNORMAL LOW (ref >60)
Glucose, Bld: 84 mg/dL (ref 70–99)
Potassium: 4.3 mmol/L (ref 3.5–5.1)
Sodium: 131 mmol/L — ABNORMAL LOW (ref 135–145)

## 2019-07-11 NOTE — Patient Instructions (Signed)
Continue current medications  Labs done today, we will notify you for abnormal results  Your provider has recommended that you have a home sleep study.  [employer] is the company that does these test.  They will contact you by phone and must speak with you before they can ship the equipment.  Once they have spoken with you they will send the equipment right to your home with instructions on how to set it up.  Once you have completed the test simply box all the equipment back up and mail back to the company.  IF you have any questions or issues with the equipment please call the company directly at [phone].  If your test is positive for sleep apnea and you need a home CPAP machine you will be contacted by Dr Theodosia Blender office [facility]) to set this up.  You have been ordered a PYP Scan.  This is done in the Radiology Department of [facility].  When you come for this test please plan to be there 2-3 hours.  Once approved by your insurance company you will be contacted to schedule this  Your physician recommends that you schedule a follow-up appointment in: 2 months  If you have any questions or concerns before your next appointment please send [name] a message through [place] or call our office at [phone].  At the [facility], you and your health needs are our priority. As part of our continuing mission to provide you with exceptional heart care, we have created designated Provider Care Teams. These Care Teams include your primary Cardiologist (physician) and Advanced Practice Providers (APPs- Physician Assistants and Nurse Practitioners) who all work together to provide you with the care you need, when you need it.   You may see any of the following providers on your designated Care Team at your next follow up: Marland Kitchen Dr Glori Bickers . Dr [name] . Darrick Grinder, NP . Lyda Jester, PA . Audry Riles, PharmD   Please be sure to bring in all your  medications bottles to every appointment.

## 2019-07-12 ENCOUNTER — Telehealth (HOSPITAL_COMMUNITY): Payer: Self-pay

## 2019-07-12 DIAGNOSIS — I5032 Chronic diastolic (congestive) heart failure: Secondary | ICD-10-CM

## 2019-07-12 LAB — MULTIPLE MYELOMA PANEL, SERUM
Albumin SerPl Elph-Mcnc: 3.5 g/dL (ref 2.9–4.4)
Albumin/Glob SerPl: 1.1 (ref 0.7–1.7)
Alpha 1: 0.3 g/dL (ref 0.0–0.4)
Alpha2 Glob SerPl Elph-Mcnc: 0.8 g/dL (ref 0.4–1.0)
B-Globulin SerPl Elph-Mcnc: 1.1 g/dL (ref 0.7–1.3)
Gamma Glob SerPl Elph-Mcnc: 1.3 g/dL (ref 0.4–1.8)
Globulin, Total: 3.4 g/dL (ref 2.2–3.9)
IgA: 314 mg/dL (ref 61–437)
IgG (Immunoglobin G), Serum: 1345 mg/dL (ref 603–1613)
IgM (Immunoglobulin M), Srm: 50 mg/dL (ref 20–172)
Total Protein ELP: 6.9 g/dL (ref 6.0–8.5)

## 2019-07-12 NOTE — Telephone Encounter (Signed)
-----   Message from Larey Dresser, MD sent at 07/11/2019  4:40 PM EST ----- Creatinine higher.  Would not change torsemide for now but repeat BMET on Monday to make sure it is not worsening.

## 2019-07-12 NOTE — Progress Notes (Addendum)
error 

## 2019-07-12 NOTE — Telephone Encounter (Signed)
Spoke with patients wife as patient was sleeping.  Advised of lab results and need to repeat blood work. Verbalized understanding. appt made for 12/7

## 2019-07-12 NOTE — Progress Notes (Signed)
PCP: Colon Branch, MD Cardiology: Dr. Caryl Comes HF Cardiology: Dr. Aundra Dubin  68 y.o. with history of COPD, paroxysmal atrial fibrillation, type A aortic dissection, and chronic diastolic CHF was referred by Dr. Caryl Comes for evaluation of CHF.  Prior to 2018, patient reports no significant limitations.  In 8/18, he had a Type A aortic dissection.  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  The dissection extended down to the abdominal aorta.  He was a heavy smoker, quit in 8/18.  He developed paroxysmal atrial fibrillation, had DCCV in 10/19.  He is on Eliquis and flecainide now.  Last echo in 2/20 showed EF 55%, mildly decreased RV systolic function, normally functioning bioprosthetic aortic valve.  PFTs in 6/19 showed moderate-severe obstruction.    Ever since his operation in 2018, Mr Sean Riley has had significant exertional dyspnea and has been quite limited.  He has orthopnea, sleeps propped up in sleep number bed or in a recliner.  Moderate exertion of any form wears him out.  He is fatigued during the day and snores, wife says he will stop breathing at night.   He had RHC in 11/20, showing normal filling pressures, preserved cardiac output, and mild primarily pulmonary venous hypertension.    At last appointment, I transitioned him from Lasix to torsemide and later increased the torsemide.  Weight is down 5 lbs.  He walked today in the hall and his oxygen saturation stayed > 90% (improved compared to last appointment).  He is not coughing at night now.  Breathing is somewhat better, he can now walk about 50 feet before he gets short of breath.    Labs (8/20): LDL 74 Labs (10/20): Na 126, K 4.5, creatinine 1.15 Labs (11/20): K 3.8, creatinine 1.3  ECG (personally reviewed): NSR, RBBB, LAFB, 1st degree AVB  PMH: 1. COPD: PFTs (6/19) with moderate-severe obstruction.  Prior heavy smoker, quit 2018.  2. Atrial fibrillation: Paroxysmal. DCCV in  10/19, now on flecainide.  3. HTN 4. Chronic diastolic CHF: Echo (A999333) with EF 55%, mild LVH, mild LV dilation, mild RV dilation with mildly decreased systolic function, bioprosthetic aortic valve with mean gradient 15 mmHg.  - RHC (11/20): mean RA 9, PA 41/15, mean PCWP 13, CI 2.81, PVR 2.15 WU.  5. Type A aortic dissection: 8/18, patient had emergency repair with Bentall/bioprosthetic aortic valve.  Dissection extends down to abdominal aorta.  Coronaries re-implanted. Complicated by pericardial effusion requiring pericardiocentesis.  6. Chronic hyponatremia.   FH: Uncle with aortic dissection, mother with SCD at age 95.   SH: Lives on [place] farm in [place].  Married, prior heavy smoker but quit in 2018.   ROS: All systems reviewed and negative except as per HPI.   Current Outpatient Medications  Medication Sig Dispense Refill  . albuterol (PROAIR HFA) 108 (90 Base) MCG/ACT inhaler Inhale 2 puffs into the lungs every 4 (four) hours as needed for wheezing or shortness of breath. 54 g 3  . albuterol (PROVENTIL) (2.5 MG/3ML) 0.083% nebulizer solution Take 3 mLs (2.5 mg total) by nebulization every 8 (eight) hours as needed for wheezing or shortness of breath. 150 mL 1  . ALPRAZolam (XANAX) 1 MG tablet TAKE 1-1.5 TABLETS (1-1.5 MG TOTAL) BY MOUTH AT BEDTIME. 40 tablet 0  . aspirin EC 81 MG tablet Take 1 tablet (81 mg total) by mouth daily.    Marland Kitchen augmented betamethasone dipropionate (DIPROLENE-AF) 0.05 % cream Apply 1 application topically 2 (two) times daily as needed (  itchy skin.). 50 g 1  . azelastine (ASTELIN) 0.1 % nasal spray Place 2 sprays into both nostrils 2 (two) times daily as needed for allergies.    . diphenhydrAMINE (BENADRYL) 25 MG tablet Take 25 mg by mouth every 8 (eight) hours as needed (allergies/runny nose).     Marland Kitchen ELIQUIS 5 MG TABS tablet TAKE 1 TABLET TWICE A DAY 180 tablet 1  . escitalopram (LEXAPRO) 10 MG tablet Take 1 tablet (10 mg total) by mouth daily. 90 tablet 3  .  esomeprazole (NEXIUM) 40 MG capsule Take 1 capsule (40 mg total) by mouth daily before breakfast. 90 capsule 3  . flecainide (TAMBOCOR) 50 MG tablet Take 1 tablet (50 mg total) by mouth 2 (two) times daily. 270 tablet 3  . fluticasone (FLONASE) 50 MCG/ACT nasal spray Place 1 spray into both nostrils 2 (two) times daily as needed for allergies. Use one spray in each nostril twice daily.    . fluticasone furoate-vilanterol (BREO ELLIPTA) 200-25 MCG/INH AEPB Inhale 1 puff into the lungs at bedtime.    Marland Kitchen losartan (COZAAR) 25 MG tablet Take 1 tablet (25 mg total) by mouth daily. 90 tablet 1  . metoprolol tartrate (LOPRESSOR) 25 MG tablet Take 1 tablet (25 mg total) by mouth 2 (two) times daily. 180 tablet 3  . potassium chloride SA (KLOR-CON) 20 MEQ tablet Take 2 tablets (40 mEq total) by mouth daily. 90 tablet 3  . rosuvastatin (CRESTOR) 5 MG tablet Take 1 tablet (5 mg total) by mouth daily. 90 tablet 3  . torsemide (DEMADEX) 20 MG tablet Take 2 tablets (40 mg total) by mouth every other day AND 3 tablets (60 mg total) every other day. 180 tablet 3  . umeclidinium bromide (INCRUSE ELLIPTA) 62.5 MCG/INH AEPB Inhale 1 puff into the lungs at bedtime.      No current facility-administered medications for this encounter.    BP (!) 142/54   Pulse 75   Wt 94.2 kg (207 lb 9.6 oz)   SpO2 100%   BMI 28.16 kg/m  General: NAD Neck: No JVD, no thyromegaly or thyroid nodule.  Lungs: Slight crackles at bases. CV: Nondisplaced PMI.  Heart regular S1/S2, no S3/S4,2/6 SEM RUSB.  No peripheral edema.  No carotid bruit.  Normal pedal pulses.  Abdomen: Soft, nontender, no hepatosplenomegaly, no distention.  Skin: Intact without lesions or rashes.  Neurologic: Alert and oriented x 3.  Psych: Normal affect. Extremities: No clubbing or cyanosis.  HEENT: Normal.   Assessment/Plan: 1. Atrial fibrillation: Paroxysmal.  He is currently in NSR.  - Continue Eliquis 5 mg bid.  - Continue flecainide + metoprolol.  2.  Chronic diastolic CHF: Echo in A999333 with EF 55%, mildly decreased RV systolic function, normally functioning bioprosthetic aortic valve. He was volume overloaded at last appointment and torsemide was started then increased, weight now down 5 lbs.  RHC in 11/20 after increasing torsemide showed near-normal filling pressures.  He is improved but symptoms are still NYHA class III.  Oxygen saturation is better on hall walk today.  I suspect that COPD also contributes to his exercise intolerance.  - Continue torsemide 60 daily alternating with 40 daily. BMET today.   - Cardiac amyloidosis is a consideration with prominent diastolic CHF => I will order a PYP scan and also check myeloma panel.  3. COPD: Moderate to severe obstruction on 6/19 PFTs, prior smoker.  As above, I suspect this contributes to his dyspnea.  - I have referred him back to pulmonary (  saw Dr. Vaughan Browner in the past).  - Refer to pulmonary rehab.  4. OSA: I suspect OSA (snores, daytime sleepiness).  - Home sleep study has been ordered.   5. Aortic dissection: S/p Type A aortic dissection, s/p Bentall with bioprosthetic aortic valve. Last echo in 2/20 showed stable bioprosthetic aortic valve.  Dr. Trula Slade has been following his residual dissected aorta.   - Given family history of aortic disease (uncle with aortic dissection, mother with SCD at age 81 - ?from dissection), his children's aortas need to be screened due to risk for inherited connective tissue disorder.  We discussed this.  He is not Marfanoid.   6. Hyponatremia: Cut back on po fluid intake.  7. 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  Need to follow closely for future bradyarrhythmias.   Followup 2 months.   [name] 07/12/2019

## 2019-07-12 NOTE — Telephone Encounter (Signed)
ReFaxed ov notes, stop bang and order to [facility] @ [phone].

## 2019-07-13 ENCOUNTER — Telehealth (HOSPITAL_COMMUNITY): Payer: Self-pay

## 2019-07-13 NOTE — Telephone Encounter (Signed)
-----   Message from Larey Dresser, MD sent at 07/12/2019  4:59 PM EST ----- Negative study

## 2019-07-13 NOTE — Telephone Encounter (Signed)
Pt aware of results 

## 2019-07-17 ENCOUNTER — Other Ambulatory Visit: Payer: Self-pay

## 2019-07-17 ENCOUNTER — Ambulatory Visit (HOSPITAL_COMMUNITY)
Admission: RE | Admit: 2019-07-17 | Discharge: 2019-07-17 | Disposition: A | Discharge status: Home / Self Care | Payer: Medicare Other | Source: Ambulatory Visit | Attending: Internal Medicine | Admitting: Internal Medicine

## 2019-07-17 ENCOUNTER — Telehealth (HOSPITAL_COMMUNITY): Payer: Self-pay

## 2019-07-17 DIAGNOSIS — I5032 Chronic diastolic (congestive) heart failure: Secondary | ICD-10-CM | POA: Diagnosis not present

## 2019-07-17 LAB — BASIC METABOLIC PANEL
Anion gap: 11 (ref 5–15)
BUN: 15 mg/dL (ref 8–23)
CO2: 25 mmol/L (ref 22–32)
Calcium: 9.1 mg/dL (ref 8.9–10.3)
Chloride: 90 mmol/L — ABNORMAL LOW (ref 98–111)
Creatinine, Ser: 1.51 mg/dL — ABNORMAL HIGH (ref 0.61–1.24)
GFR calc Af Amer: 54 mL/min — ABNORMAL LOW (ref >60)
GFR calc non Af Amer: 46 mL/min — ABNORMAL LOW (ref >60)
Glucose, Bld: 103 mg/dL — ABNORMAL HIGH (ref 70–99)
Potassium: 3.9 mmol/L (ref 3.5–5.1)
Sodium: 126 mmol/L — ABNORMAL LOW (ref 135–145)

## 2019-07-17 NOTE — Telephone Encounter (Signed)
-----   Message from Larey Dresser, MD sent at 07/17/2019  1:36 PM EST ----- Low sodium level, cut back on po fluid intake (<1800 cc/day).

## 2019-07-17 NOTE — Telephone Encounter (Signed)
Pt wife answered. Results reviewed. Advised to cut back on po fluids.  Verbalized understanding. She notes patient had a nose bleed last week from wednesday to Friday.  He did not have one over wknd. Advised to continue monitoring and call office if it happens again.

## 2019-07-19 ENCOUNTER — Telehealth: Payer: Self-pay | Admitting: Internal Medicine

## 2019-07-19 NOTE — Telephone Encounter (Signed)
Sent!

## 2019-07-19 NOTE — Telephone Encounter (Signed)
Alprazolam refill.   Last OV: 06/27/2019 Last Fill: 05/22/2019 #40 and 0RF Pt sig: 1 to 1 1/2 tab po qhs prn UDS: 04/18/2018 Low risk

## 2019-07-22 ENCOUNTER — Encounter (INDEPENDENT_AMBULATORY_CARE_PROVIDER_SITE_OTHER): Payer: Medicare Other | Admitting: Cardiology

## 2019-07-22 DIAGNOSIS — G4733 Obstructive sleep apnea (adult) (pediatric): Secondary | ICD-10-CM | POA: Diagnosis not present

## 2019-07-25 ENCOUNTER — Other Ambulatory Visit (HOSPITAL_COMMUNITY): Payer: Medicare Other

## 2019-07-31 ENCOUNTER — Encounter (HOSPITAL_COMMUNITY)
Admission: RE | Admit: 2019-07-31 | Discharge: 2019-07-31 | Disposition: A | Discharge status: Home / Self Care | Payer: Medicare Other | Source: Ambulatory Visit | Attending: Cardiology | Admitting: Cardiology

## 2019-07-31 ENCOUNTER — Other Ambulatory Visit: Payer: Self-pay

## 2019-07-31 DIAGNOSIS — I5032 Chronic diastolic (congestive) heart failure: Secondary | ICD-10-CM | POA: Diagnosis present

## 2019-07-31 MED ORDER — TECHNETIUM TC 99M PYROPHOSPHATE
20.0000 | Freq: Once | INTRAVENOUS | Status: AC | PRN
  Administered 2019-07-31: 10:00:00 20 via INTRAVENOUS
  Filled 2019-07-31: qty 20

## 2019-08-03 ENCOUNTER — Ambulatory Visit: Payer: Medicare Other

## 2019-08-03 DIAGNOSIS — R0683 Snoring: Secondary | ICD-10-CM

## 2019-08-03 DIAGNOSIS — G4700 Insomnia, unspecified: Secondary | ICD-10-CM

## 2019-08-03 NOTE — Procedures (Signed)
   Sleep Study Report  Patient Information Name: Sean Riley  ID: [id number] Birth Date: 1951/06/11  Age: 68  Gender: Male Insurer: Medicare  BMI: 28.7 (W=211 lb, H=6' 0'') Study Date:07/22/2019 Referring Physician: [name], MD  Summary & Diagnosis  TEST DESCRIPTION: Home sleep apnea testing was completed using the WatchPat, a Type 1 device, utilizing peripheral arterial tonometry (PAT), chest movement, actigraphy, pulse oximetry, pulse rate, body position and snore.  AHI was calculated with apnea and hypopnea using valid sleep time as the denominator. RDI includes apneas, hypopneas, and RERAs. The data acquired and the scoring of sleep and all associated events were performed in accordance with the recommended standards and specifications as outlined in the AASM Manual for the Scoring of Sleep and Associated Events 2.2.0 (2015).  FINDINGS: 1. Mild Obstructive Sleep Apnea with AHI 11.8/hr. Most events occurred in the supine position. 2. No Central Sleep Apnea. 3. Nocturnal Hypoxemia. The lowest O2 saturation was 81% with 70 minutes spent with O2 sats<88%. 4. Severe snoring was present. 5. Total sleep time was 6 hours and 42 minutes. 6. There were 10 awakenings during sleep. 7. Normal sleep onset latency at 16 minutes. 8. Prolonged REM sleep onset latency at 140 minutes.  DIAGNOSIS: Mild Obstructive Sleep Apnea (G47.33)  RECOMMENDATIONS: 1. Clinical correlation of these findings is necessary. The decision to treat obstructive sleep apnea (OSA) is usually based on the presence of apnea symptoms or the presence of associated medical conditions such as Hypertension, Congestive Heart Failure, Atrial Fibrillation or Obesity. The most common symptoms of OSA are snoring, gasping for breath while sleeping, daytime sleepiness and fatigue.  2. Initiating apnea therapy is recommended given the presence of symptoms and/or associated conditions. Recommend proceeding with one of the  following:   a. Auto-CPAP therapy with a pressure range of 5-20cm H2O .  b. An oral appliance (OA) that can be obtained from certain dentists with expertise in sleep medicine. These are primarily of use in non-obese patients with mild and moderate disease.   c. An ENT consultation which may be useful to look for specific causes of obstruction and possible treatment Options.   d. If patient is intolerant to PAP therapy, consider referral to ENT for evaluation for hypoglossal nerve stimulator.  3. Close follow-up is necessary to ensure success with CPAP or oral appliance therapy for maximum benefit .  4. A follow-up oximetry study on CPAP is recommended to assess the adequacy of therapy and determine the need for supplemental oxygen or the potential need for Bi-level therapy. An arterial blood gas to determine the adequacy of baseline ventilation and oxygenation should also be considered.  5. Healthy sleep recommendations include: adequate nightly sleep (normal 7-9 hrs/night), avoidance of caffeine after noon and alcohol near bedtime, and maintaining a sleep environment that is cool, dark and quiet.  6. Weight loss for overweight patients is recommended. Even modest amounts of weight loss can significantly improve the severity of sleep apnea.  7. Snoring recommendations include: weight loss where appropriate, side sleeping, and avoidance of alcohol before Bed.  8. Operation of motor vehicle or dangerous equipment must be avoided when feeling drowsy, excessively sleepy, or mentally fatigued.  Report prepared by: Signature: Fransico Him , MD Electronically Signed: Aug 03, 2019

## 2019-08-07 ENCOUNTER — Other Ambulatory Visit: Payer: Self-pay

## 2019-08-08 ENCOUNTER — Telehealth: Payer: Self-pay | Admitting: *Deleted

## 2019-08-08 NOTE — Telephone Encounter (Signed)
-----   Message from Sueanne Margarita, MD sent at 08/03/2019  7:44 PM EST ----- Please let patient know that they have sleep apnea and recommend auto CPAP titration through [facility].  Orders have been placed in Epic. Please set 10 week OV with me.

## 2019-08-08 NOTE — Telephone Encounter (Signed)
Informed patient of sleep study results and patient understanding was verbalized. Patient understands his sleep study showed they have sleep apnea and recommend auto CPAP titration through [facility].  Orders have been placed in Epic. Please set 10 week OV with me.    Upon patient request DME selection is [facility]. Patient understands he will be contacted by [facility] to set up his cpap. Patient understands to call if [facility] does not contact him with new setup in a timely manner. Patient understands they will be called once confirmation has been received from [facility] that they have received their new machine to schedule 10 week follow up appointment.  [facility] notified of new cpap order  Please add to airview Patient was grateful for the call and thanked me.

## 2019-08-14 ENCOUNTER — Telehealth: Payer: Self-pay | Admitting: Internal Medicine

## 2019-08-14 NOTE — Telephone Encounter (Signed)
I spoke to the patient's wife who will need to accompany the patient to their appointment on 1/6 with Dr Caryl Comes because of memory issues.

## 2019-08-14 NOTE — Telephone Encounter (Signed)
Patient's wife [name] requesting to come with the patient to his appointment on 1/6. She states she needs to help him, because he has memory issues.

## 2019-08-16 ENCOUNTER — Other Ambulatory Visit: Payer: Self-pay

## 2019-08-16 ENCOUNTER — Ambulatory Visit (INDEPENDENT_AMBULATORY_CARE_PROVIDER_SITE_OTHER): Payer: Medicare Other | Admitting: Internal Medicine

## 2019-08-16 ENCOUNTER — Encounter: Payer: Self-pay | Admitting: Internal Medicine

## 2019-08-16 VITALS — BP 124/68 | HR 66 | Ht 72.0 in | Wt 207.2 lb

## 2019-08-16 DIAGNOSIS — I471 Supraventricular tachycardia: Secondary | ICD-10-CM

## 2019-08-16 DIAGNOSIS — I480 Paroxysmal atrial fibrillation: Secondary | ICD-10-CM

## 2019-08-16 NOTE — Patient Instructions (Signed)
Medication Instructions:  Your physician recommends that you continue on your current medications as directed. Please refer to the Current Medication list given to you today.  *If you need a refill on your cardiac medications before your next appointment, please call your pharmacy*  Lab Work: None ordered.  If you have labs (blood work) drawn today and your tests are completely normal, you will receive your results only by: Marland Kitchen MyChart Message (if you have MyChart) OR . A paper copy in the mail If you have any lab test that is abnormal or we need to change your treatment, we will call you to review the results.  Testing/Procedures: None ordered.   Follow-Up: At [facility], you and your health needs are our priority.  As part of our continuing mission to provide you with exceptional heart care, we have created designated Provider Care Teams.  These Care Teams include your primary Cardiologist (physician) and Advanced Practice Providers (APPs -  Physician Assistants and Nurse Practitioners) who all work together to provide you with the care you need, when you need it.  Your next appointment:  One year with Dr Caryl Comes

## 2019-08-16 NOTE — Progress Notes (Signed)
Patient Care Team: Colon Branch, MD as PCP - General (Internal Medicine) Deboraha Sprang, MD as Consulting Physician (Cardiology) Darleen Crocker, MD as Consulting Physician (Ophthalmology) Loletha Carrow Kirke Corin, MD as Consulting Physician (Gastroenterology) Neldon Mc Donnamarie Poag, MD as Consulting Physician (Allergy and Immunology) Rexene Alberts, MD as Consulting Physician (Cardiothoracic Surgery) Larey Dresser, MD as Consulting Physician (Cardiology)   HPI  Sean Riley is a 69 y.o. male Seen With Chief Complaint of  atrial fibrillation.  He remains on flecainide and is anticoagulated with apixaban with concomitant aspirin.   Hospitalized 8/18 for aortic dissection w root replacement, prosthetic AVR and reconstruction of entire ascending aorta cx by pericardial effusion requiring pericardiocentesis and some R hand injury.  Subsequently developed worsening ascending aortic dilatation and underwent endovascular repair 8/19+  DATE TEST EF   2013/  MYOVIEW   58 % Abnormal  2013 [facility]    % Normal CAs  12/16 MYOVIEW 48%   9/18 Echo  50-55% Bioprosthetic AVR  2/20 Echo  55% LVH     Date PR QRS Flecainide  5/12  112   5/15  116 100  5/16  132 100  6/17  132 100  12/17  136 100  9/18 166 144 100  1/19 180 146 100  5/19 176 156 100  9/19`  170 100  10/19 174 162 100  12/19 172 156 100  1/21 210  160    When seen 9/19 he was found to have atrial tachycardia underwent cardioversion  10/19 seen with chronic CHF with vol overload; increased diuretic     Thromboembolic risk factors ( age -31, HTN-1, Vasc disease -1) for a CHADSVASc Score of 3    Past Medical History:  Diagnosis Date  . Allergy   . Arthritis   . Ascending aortic dissection ([place]) 03/23/2017  . Atrial fibrillation ([place])   . Cataract    removed both eyes   . GERD (gastroesophageal reflux disease)   . HYPERLIPIDEMIA   . 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 Hypertension   . Microscopic  hematuria    negative cystoscopy  . NONSPECIFIC ABNORMAL ELECTROCARDIOGRAM   . Pleural effusion on right   . Pleural effusion, bilateral   . S/P aortic dissection repair 03/24/2017   Biological Bentall aortic root replacement + resection and grafting of entire ascending aorta, transverse aortic arch and proximal descending thoracic aorta with elephant trunk distal anastomosis and debranching of aortic arch vessels  . 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 Varicose veins     Past Surgical History:  Procedure Laterality Date  . APPENDECTOMY    . CARDIAC CATHETERIZATION      X3;Dr Caryl Comes, last 01/20/12: mild non-obstructive CAD, normal LV systolic function  . CARDIOVERSION N/A 05/31/2018   Procedure: CARDIOVERSION;  Surgeon: Sueanne Margarita, MD;  Location: [facility] ENDOSCOPY;  Service: Cardiovascular;  Laterality: N/A;  . COLONOSCOPY    . CYSTOSCOPY  1978   Dr Hartley Barefoot  . EYE SURGERY    . IR THORACENTESIS ASP PLEURAL SPACE W/IMG GUIDE  11/25/2017  . KNEE ARTHROSCOPY  2012   Dr Theda Sers  . LUMBAR LAMINECTOMY  1987   Dr Durward Fortes  . MYELOGRAM  2007  . PERICARDIOCENTESIS N/A 04/07/2017  Procedure: PERICARDIOCENTESIS;  Surgeon: Sherren Mocha, MD;  Location: [place] CV LAB;  Service: Cardiovascular;  Laterality: N/A;  . POLYPECTOMY    . REPAIR OF ACUTE ASCENDING THORACIC AORTIC DISSECTION N/A 03/23/2017   Procedure: REPAIR OF ACUTE ASCENDING THORACIC AORTIC DISSECTION.  Bentall procedure.  Aortic root repleacement with bioprosthetic valve.  Reimplantation of left and right coronary arteries.  Total resection of transverse aortic arch.  Elephant trunk distal anastomosis and debranching of arch vessels.;  Surgeon: Rexene Alberts, MD;  Location: [place];  Service: Vascular;  Laterality: N/A;  . RIGHT  HEART CATH N/A 06/15/2019   Procedure: RIGHT HEART CATH;  Surgeon: Larey Dresser, MD;  Location: [place] CV LAB;  Service: Cardiovascular;  Laterality: N/A;  . ROTATOR CUFF REPAIR    . SHOULDER ARTHROSCOPY  2011   Dr Theda Sers  . SHOULDER ARTHROSCOPY Right 08/2015  . SPINAL FUSION  1986   Dr Rolin Barry  . TEE WITHOUT CARDIOVERSION N/A 03/23/2017   Procedure: TRANSESOPHAGEAL ECHOCARDIOGRAM (TEE);  Surgeon: Rexene Alberts, MD;  Location: [facility];  Service: Open Heart Surgery;  Laterality: N/A;  . THORACIC AORTIC ENDOVASCULAR STENT GRAFT N/A 03/30/2018   Procedure: THORACIC AORTIC ENDOVASCULAR STENT GRAFT WITH INTRAVASCULAR ULTRASOUND;  Surgeon: Serafina Mitchell, MD;  Location: [facility] OR;  Service: Vascular;  Laterality: N/A;  . TRACHEOSTOMY     age 89 for croup    Current Outpatient Medications  Medication Sig Dispense Refill  . albuterol (PROAIR HFA) 108 (90 Base) MCG/ACT inhaler Inhale 2 puffs into the lungs every 4 (four) hours as needed for wheezing or shortness of breath. 54 g 3  . albuterol (PROVENTIL) (2.5 MG/3ML) 0.083% nebulizer solution Take 3 mLs (2.5 mg total) by nebulization every 8 (eight) hours as needed for wheezing or shortness of breath. 150 mL 1  . ALPRAZolam (XANAX) 1 MG tablet TAKE 1-1&1/2 TABLETS BY MOUTH AT BEDTIME 40 tablet 2  . aspirin EC 81 MG tablet Take 1 tablet (81 mg total) by mouth daily.    Marland Kitchen augmented betamethasone dipropionate (DIPROLENE-AF) 0.05 % cream Apply 1 application topically 2 (two) times daily as needed (itchy skin.). 50 g 1  . azelastine (ASTELIN) 0.1 % nasal spray Place 2 sprays into both nostrils 2 (two) times daily as needed for allergies.    . diphenhydrAMINE (BENADRYL) 25 MG tablet Take 25 mg by mouth every 8 (eight) hours as needed (allergies/runny nose).     Marland Kitchen ELIQUIS 5 MG TABS tablet TAKE 1 TABLET TWICE A DAY 180 tablet 1  . escitalopram (LEXAPRO) 10 MG tablet Take 1 tablet (10 mg total) by mouth daily. 90 tablet 3  . esomeprazole (NEXIUM) 40 MG  capsule Take 1 capsule (40 mg total) by mouth daily before breakfast. 90 capsule 3  . flecainide (TAMBOCOR) 50 MG tablet Take 1 tablet (50 mg total) by mouth 2 (two) times daily. 270 tablet 3  . fluticasone (FLONASE) 50 MCG/ACT nasal spray Place 1 spray into both nostrils 2 (two) times daily as needed for allergies. Use one spray in each nostril twice daily.    . fluticasone furoate-vilanterol (BREO ELLIPTA) 200-25 MCG/INH AEPB Inhale 1 puff into the lungs at bedtime.    Marland Kitchen losartan (COZAAR) 25 MG tablet Take 1 tablet (25 mg total) by mouth daily. 90 tablet 1  . metoprolol tartrate (LOPRESSOR) 25 MG tablet Take 1 tablet (25 mg total) by mouth 2 (two) times daily. 180 tablet 3  . potassium chloride SA (KLOR-CON) 20 MEQ tablet  Take 2 tablets (40 mEq total) by mouth daily. 90 tablet 3  . rosuvastatin (CRESTOR) 5 MG tablet Take 1 tablet (5 mg total) by mouth daily. 90 tablet 3  . torsemide (DEMADEX) 20 MG tablet Take 2 tablets (40 mg total) by mouth every other day AND 3 tablets (60 mg total) every other day. 180 tablet 3  . umeclidinium bromide (INCRUSE ELLIPTA) 62.5 MCG/INH AEPB Inhale 1 puff into the lungs at bedtime.      No current facility-administered medications for this visit.    Allergies  Allergen Reactions  . Simvastatin Other (See Comments)    Mental status changes  . Celecoxib Other (See Comments)    GI UPSET AND INFLAMMATION  . Codeine Other (See Comments)    GI UPSET AND INFLAMMATION  . Nsaids Other (See Comments)    GI UPSET AND INFLAMMATION (can tolerate via IV)  . Tape Other (See Comments)    Medical tape and Band-Aids PULL OFF THE SKIN; please use Coban wrap  . Cephalexin Itching and Rash  . Latex Rash    Review of Systems negative except from HPI and PMH  Physical Exam BP 124/68   Pulse 66   Ht 6' (1.829 m)   Wt 207 lb 3.2 oz (94 kg)   SpO2 96%   BMI 28.10 kg/m  Well developed and nourished in no acute distress HENT normal Neck supple with JVP-  Flat   Clear Regular rate and rhythm, no murmurs or gallops Abd-soft with active BS No Clubbing cyanosis edema Skin-warm and dry A & Oriented  Grossly normal sensory and motor function  ECG sinus at 66 Interval 21/16/46  Assessment and Plan  Atrial tachycardia and fibrillation  Hypertension  HFpEF  Aortic dissection status post reconstruction   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

## 2019-08-21 NOTE — Telephone Encounter (Signed)
Patient has a 10 week follow up appointment scheduled for 09/27/19. Patient understands he needs to keep this appointment for insurance compliance. Patient was grateful for the call and thanked me.

## 2019-09-11 ENCOUNTER — Telehealth (HOSPITAL_COMMUNITY): Payer: Self-pay

## 2019-09-11 NOTE — Telephone Encounter (Signed)

## 2019-09-12 ENCOUNTER — Ambulatory Visit (HOSPITAL_COMMUNITY)
Admission: RE | Admit: 2019-09-12 | Discharge: 2019-09-12 | Disposition: A | Discharge status: Home / Self Care | Payer: Medicare Other | Source: Ambulatory Visit | Attending: Cardiology | Admitting: Cardiology

## 2019-09-12 ENCOUNTER — Other Ambulatory Visit: Payer: Self-pay

## 2019-09-12 ENCOUNTER — Encounter (HOSPITAL_COMMUNITY): Payer: Self-pay | Admitting: Cardiology

## 2019-09-12 VITALS — BP 140/62 | HR 61 | Wt 209.8 lb

## 2019-09-12 DIAGNOSIS — Z7901 Long term (current) use of anticoagulants: Secondary | ICD-10-CM | POA: Insufficient documentation

## 2019-09-12 DIAGNOSIS — G4733 Obstructive sleep apnea (adult) (pediatric): Secondary | ICD-10-CM | POA: Diagnosis not present

## 2019-09-12 DIAGNOSIS — E871 Hypo-osmolality and hyponatremia: Secondary | ICD-10-CM | POA: Diagnosis not present

## 2019-09-12 DIAGNOSIS — Z79899 Other long term (current) drug therapy: Secondary | ICD-10-CM | POA: Insufficient documentation

## 2019-09-12 DIAGNOSIS — I4510 Unspecified right bundle-branch block: Secondary | ICD-10-CM | POA: Diagnosis not present

## 2019-09-12 DIAGNOSIS — Z7951 Long term (current) use of inhaled steroids: Secondary | ICD-10-CM | POA: Diagnosis not present

## 2019-09-12 DIAGNOSIS — Z952 Presence of prosthetic heart valve: Secondary | ICD-10-CM | POA: Diagnosis not present

## 2019-09-12 DIAGNOSIS — J449 Chronic obstructive pulmonary disease, unspecified: Secondary | ICD-10-CM | POA: Insufficient documentation

## 2019-09-12 DIAGNOSIS — I5022 Chronic systolic (congestive) heart failure: Secondary | ICD-10-CM

## 2019-09-12 DIAGNOSIS — I110 Hypertensive heart disease with heart failure: Secondary | ICD-10-CM | POA: Insufficient documentation

## 2019-09-12 DIAGNOSIS — Z87891 Personal history of nicotine dependence: Secondary | ICD-10-CM | POA: Diagnosis not present

## 2019-09-12 DIAGNOSIS — I5032 Chronic diastolic (congestive) heart failure: Secondary | ICD-10-CM | POA: Diagnosis not present

## 2019-09-12 DIAGNOSIS — I491 Atrial premature depolarization: Secondary | ICD-10-CM | POA: Diagnosis not present

## 2019-09-12 DIAGNOSIS — I480 Paroxysmal atrial fibrillation: Secondary | ICD-10-CM | POA: Diagnosis not present

## 2019-09-12 DIAGNOSIS — Z7982 Long term (current) use of aspirin: Secondary | ICD-10-CM | POA: Diagnosis not present

## 2019-09-12 LAB — BASIC METABOLIC PANEL
Anion gap: 10 (ref 5–15)
BUN: 17 mg/dL (ref 8–23)
CO2: 27 mmol/L (ref 22–32)
Calcium: 9.4 mg/dL (ref 8.9–10.3)
Chloride: 96 mmol/L — ABNORMAL LOW (ref 98–111)
Creatinine, Ser: 1.8 mg/dL — ABNORMAL HIGH (ref 0.61–1.24)
GFR calc Af Amer: 44 mL/min — ABNORMAL LOW (ref >60)
GFR calc non Af Amer: 38 mL/min — ABNORMAL LOW (ref >60)
Glucose, Bld: 89 mg/dL (ref 70–99)
Potassium: 4.4 mmol/L (ref 3.5–5.1)
Sodium: 133 mmol/L — ABNORMAL LOW (ref 135–145)

## 2019-09-12 MED ORDER — POTASSIUM CHLORIDE CRYS ER 20 MEQ PO TBCR: 40.0000 meq | EXTENDED_RELEASE_TABLET | Freq: Every day | ORAL | 2 refills | Status: DC

## 2019-09-12 MED ORDER — TORSEMIDE 20 MG PO TABS: 60.0000 mg | ORAL_TABLET | Freq: Every day | ORAL | 2 refills | Status: DC

## 2019-09-12 NOTE — Progress Notes (Signed)
PCP: Colon Branch, MD Cardiology: Dr. Caryl Comes HF Cardiology: Dr. Aundra Dubin  69 y.o. with history of COPD, paroxysmal atrial fibrillation, type A aortic dissection, and chronic diastolic CHF was referred by Dr. Caryl Comes for evaluation of CHF.  Prior to 2018, patient reports no significant limitations.  In 8/18, he had a Type A aortic dissection.  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  The dissection extended down to the abdominal aorta.  He was a heavy smoker, quit in 8/18.  He developed paroxysmal atrial fibrillation, had DCCV in 10/19.  He is on Eliquis and flecainide now.  Last echo in 2/20 showed EF 55%, mildly decreased RV systolic function, normally functioning bioprosthetic aortic valve.  PFTs in 6/19 showed moderate-severe obstruction.    Ever since his operation in 2018, Sean Riley has had significant exertional dyspnea and has been quite limited.  He has orthopnea, sleeps propped up in sleep number bed or in a recliner.  Moderate exertion of any form wears him out.  He is fatigued during the day and snores, wife says he will stop breathing at night.   He had RHC in 11/20, showing normal filling pressures, preserved cardiac output, and mild primarily pulmonary venous hypertension.    PYP scan was not suggestive of cardiac amyloidosis.   He returns for followup of CHF.  Weight is up about 2 lbs.  Breathing has been better overall on torsemide.  Still short of breath after walking about 100 yards.  Uses cane.  No orthopnea/PND.  He has noted increased LE edema recently.  He has started on CPAP and has less daytime sleepiness.   REDS clip 34%  Labs (8/20): LDL 74 Labs (10/20): Na 126, K 4.5, creatinine 1.15 Labs (11/20): K 3.8, creatinine 1.3 Labs (12/20): K 3.9, creatinine 1.5, myeloma panel negative  ECG (personally reviewed): NSR, RBBB, PVC  PMH: 1. COPD: PFTs (6/19) with moderate-severe obstruction.  Prior heavy smoker, quit  2018.  2. Atrial fibrillation: Paroxysmal. DCCV in 10/19, now on flecainide.  3. HTN 4. Chronic diastolic CHF: Echo (A999333) with EF 55%, mild LVH, mild LV dilation, mild RV dilation with mildly decreased systolic function, bioprosthetic aortic valve with mean gradient 15 mmHg.  - RHC (11/20): mean RA 9, PA 41/15, mean PCWP 13, CI 2.81, PVR 2.15 WU.   - PYP scan not suggestive of cardiac amyloidosis.  5. Type A aortic dissection: 8/18, patient had emergency repair with Bentall/bioprosthetic aortic valve.  Dissection extends down to abdominal aorta.  Coronaries re-implanted. Complicated by pericardial effusion requiring pericardiocentesis.  6. Chronic hyponatremia.  7. OSA: He is using CPAP.   FH: Uncle with aortic dissection, mother with SCD at age 43.   SH: Lives [place] in [place].  Married, prior heavy smoker but quit in 2018.   ROS: All systems reviewed and negative except as per HPI.   Current Outpatient Medications  Medication Sig Dispense Refill  . albuterol (PROAIR HFA) 108 (90 Base) MCG/ACT inhaler Inhale 2 puffs into the lungs every 4 (four) hours as needed for wheezing or shortness of breath. 54 g 3  . albuterol (PROVENTIL) (2.5 MG/3ML) 0.083% nebulizer solution Take 3 mLs (2.5 mg total) by nebulization every 8 (eight) hours as needed for wheezing or shortness of breath. 150 mL 1  . ALPRAZolam (XANAX) 1 MG tablet TAKE 1-1&1/2 TABLETS BY MOUTH AT BEDTIME 40 tablet 2  . aspirin EC 81 MG tablet Take 1 tablet (81 mg total) by  mouth daily.    Marland Kitchen augmented betamethasone dipropionate (DIPROLENE-AF) 0.05 % cream Apply 1 application topically 2 (two) times daily as needed (itchy skin.). 50 g 1  . azelastine (ASTELIN) 0.1 % nasal spray Place 2 sprays into both nostrils 2 (two) times daily as needed for allergies.    . diphenhydrAMINE (BENADRYL) 25 MG tablet Take 25 mg by mouth every 8 (eight) hours as needed (allergies/runny nose).     Marland Kitchen ELIQUIS 5 MG TABS tablet TAKE 1 TABLET TWICE A DAY  180 tablet 1  . escitalopram (LEXAPRO) 10 MG tablet Take 1 tablet (10 mg total) by mouth daily. 90 tablet 3  . esomeprazole (NEXIUM) 40 MG capsule Take 1 capsule (40 mg total) by mouth daily before breakfast. 90 capsule 3  . flecainide (TAMBOCOR) 50 MG tablet Take 1 tablet (50 mg total) by mouth 2 (two) times daily. 270 tablet 3  . fluticasone (FLONASE) 50 MCG/ACT nasal spray Place 1 spray into both nostrils 2 (two) times daily as needed for allergies. Use one spray in each nostril twice daily.    . fluticasone furoate-vilanterol (BREO ELLIPTA) 200-25 MCG/INH AEPB Inhale 1 puff into the lungs at bedtime.    Marland Kitchen losartan (COZAAR) 25 MG tablet Take 1 tablet (25 mg total) by mouth daily. 90 tablet 1  . potassium chloride SA (KLOR-CON) 20 MEQ tablet Take 2 tablets (40 mEq total) by mouth daily. 180 tablet 2  . rosuvastatin (CRESTOR) 5 MG tablet Take 1 tablet (5 mg total) by mouth daily. 90 tablet 3  . torsemide (DEMADEX) 20 MG tablet Take 3 tablets (60 mg total) by mouth daily. 270 tablet 2  . umeclidinium bromide (INCRUSE ELLIPTA) 62.5 MCG/INH AEPB Inhale 1 puff into the lungs at bedtime.     . metoprolol tartrate (LOPRESSOR) 25 MG tablet Take 1 tablet (25 mg total) by mouth 2 (two) times daily. 180 tablet 3   No current facility-administered medications for this encounter.   BP 140/62   Pulse 61   Wt 95.2 kg (209 lb 12.8 oz)   SpO2 96%   BMI 28.45 kg/m  General: NAD Neck: JVP 8 cm, no thyromegaly or thyroid nodule.  Lungs: Mild crackles at bases.  CV: Nondisplaced PMI.  Heart regular S1/S2, no S3/S4, 3/6 SEM RUSB.  1+ ankle edema.  No carotid bruit.  Difficult to palpate pedal pulses.  Abdomen: Soft, nontender, no hepatosplenomegaly, no distention.  Skin: Intact without lesions or rashes.  Neurologic: Alert and oriented x 3.  Psych: Normal affect. Extremities: No clubbing or cyanosis.  HEENT: Normal.   Assessment/Plan: 1. Atrial fibrillation: Paroxysmal.  He is currently in NSR.  -  Continue Eliquis 5 mg bid. He can stop ASA 81.  - Continue flecainide + metoprolol.  2. Chronic diastolic CHF: Echo in A999333 with EF 55%, mildly decreased RV systolic function, normally functioning bioprosthetic aortic valve. RHC in 11/20 after increasing torsemide showed near-normal filling pressures.  PYP study was not suggestive of ATTR amyloidosis and myeloma panel was negative.  He is improved but symptoms are still NYHA class III.  I suspect that COPD also contributes to his exercise intolerance. He is mildly volume overloaded on exam today.  - Increase torsemide to 60 mg daily with BMET/BNP today and again in 10 days.   - Repeat echo at followup appt.  - Would be good Cardiomems candidate except he has not been hospitalized in the last year.  3. COPD: Moderate to severe obstruction on 6/19 PFTs, prior smoker.  As above, I suspect this contributes to his dyspnea.  - I would like him to go back to see pulmonary again.  4. [id number] CPAP.    5. Aortic dissection: S/p Type A aortic dissection, s/p Bentall with bioprosthetic aortic valve. Last echo in 2/20 showed stable bioprosthetic aortic valve.  Dr. Trula Slade has been following his residual dissected aorta.   - Given family history of aortic disease (uncle with aortic dissection, mother with SCD at age 4 - ?from dissection), his children's aortas need to be screened due to risk for inherited connective tissue disorder.  We have discussed this.  He is not Marfanoid.   6. Hyponatremia: Cut back on po fluid intake.  7. PAD: Difficult to palpate pedal pulses.   - Check peripheral arterial dopplers.   Followup 3 months with echo.   [name] 09/12/2019

## 2019-09-12 NOTE — Patient Instructions (Signed)
INCREASE Torsemide to 60mg  (3 tabs) daily   Labs today and repeat in 10 days We will only contact you if something comes back abnormal or we need to make some changes. Otherwise no news is good news!   Please call Dr Harlow Asa office to arrange a follow up appointment. [phone]   Your physician recommends that you schedule a follow-up appointment in: 3 months with an Echo  Your physician has requested that you have an echocardiogram. Echocardiography is a painless test that uses sound waves to create images of your heart. It provides your doctor with information about the size and shape of your heart and how well your heart's chambers and valves are working. This procedure takes approximately one hour. There are no restrictions for this procedure.   Please call office at [phone] option 2 if you have any questions or concerns.    At the [facility], you and your health needs are our priority. As part of our continuing mission to provide you with exceptional heart care, we have created designated Provider Care Teams. These Care Teams include your primary Cardiologist (physician) and Advanced Practice Providers (APPs- Physician Assistants and Nurse Practitioners) who all work together to provide you with the care you need, when you need it.   You may see any of the following providers on your designated Care Team at your next follow up: Marland Kitchen Dr Glori Bickers . Dr [name] . Darrick Grinder, NP . Lyda Jester, PA . Audry Riles, PharmD   Please be sure to bring in all your medications bottles to every appointment.

## 2019-09-13 ENCOUNTER — Telehealth (HOSPITAL_COMMUNITY): Payer: Self-pay

## 2019-09-13 NOTE — Telephone Encounter (Signed)
Pts wife aware.  Have lab appt on 2/12.  In a separate message from Dr Aundra Dubin, advised pt to stop ASA.  Advised wife of same.  Verbalized understanding

## 2019-09-13 NOTE — Telephone Encounter (Signed)
-----   Message from Larey Dresser, MD sent at 09/13/2019 12:39 AM EST ----- I will not change plan but make sure he gets close repeat BMET (1 week).

## 2019-09-15 ENCOUNTER — Other Ambulatory Visit: Payer: Self-pay

## 2019-09-15 DIAGNOSIS — Z23 Encounter for immunization: Secondary | ICD-10-CM | POA: Diagnosis not present

## 2019-09-17 ENCOUNTER — Ambulatory Visit: Payer: Medicare Other

## 2019-09-18 ENCOUNTER — Ambulatory Visit (INDEPENDENT_AMBULATORY_CARE_PROVIDER_SITE_OTHER): Payer: Medicare Other | Admitting: Internal Medicine

## 2019-09-18 ENCOUNTER — Other Ambulatory Visit: Payer: Self-pay | Admitting: Internal Medicine

## 2019-09-18 ENCOUNTER — Encounter: Payer: Self-pay | Admitting: Internal Medicine

## 2019-09-18 ENCOUNTER — Other Ambulatory Visit: Payer: Self-pay

## 2019-09-18 VITALS — BP 130/45 | HR 56 | Temp 97.3°F | Resp 18 | Ht 72.0 in | Wt 210.0 lb

## 2019-09-18 DIAGNOSIS — N62 Hypertrophy of breast: Secondary | ICD-10-CM | POA: Diagnosis not present

## 2019-09-18 NOTE — Patient Instructions (Addendum)
Please schedule Medicare Wellness with Glenard Haring.   Will refer you to Dr Loanne Drilling

## 2019-09-18 NOTE — Telephone Encounter (Signed)
Prescription refill request for Eliquis received.  Last office visit: 1/6/2021[name] Scr: 1.80 09/12/2019 Age: 69 y.o. Weight: 95.3 kg   Prescription refill sent.

## 2019-09-18 NOTE — Progress Notes (Signed)
Subjective:    Patient ID: Sean Riley, male    DOB: [birth year], 69 y.o.   MRN: [id number]  DOS:  09/18/2019 Type of visit - description: Acute Here with his wife. He reports mild gynecomastia for a while, increased for 2 months. Denies nipple discharge. No redness or warmness however breasts are  somewhat tender.   Review of Systems See above   Past Medical History:  Diagnosis Date  . Allergy   . Arthritis   . Ascending aortic dissection ([place]) 03/23/2017  . Atrial fibrillation ([place])   . Cataract    removed both eyes   . GERD (gastroesophageal reflux disease)   . HYPERLIPIDEMIA   . 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 Hypertension   . Microscopic hematuria    negative cystoscopy  . NONSPECIFIC ABNORMAL ELECTROCARDIOGRAM   . Pleural effusion on right   . Pleural effusion, bilateral   . S/P aortic dissection repair 03/24/2017   Biological Bentall aortic root replacement + resection and grafting of entire ascending aorta, transverse aortic arch and proximal descending thoracic aorta with elephant trunk distal anastomosis and debranching of aortic arch vessels  . 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 Varicose veins     Past Surgical History:  Procedure Laterality Date  . APPENDECTOMY    . CARDIAC CATHETERIZATION      X3;Dr Caryl Comes, last 01/20/12: mild non-obstructive CAD, normal LV systolic function  . CARDIOVERSION N/A 05/31/2018   Procedure: CARDIOVERSION;  Surgeon: Sueanne Margarita, MD;  Location: [facility] ENDOSCOPY;  Service: Cardiovascular;  Laterality: N/A;  . COLONOSCOPY    . CYSTOSCOPY  1978   Dr Hartley Barefoot  . EYE SURGERY    . IR THORACENTESIS ASP PLEURAL SPACE W/IMG GUIDE  11/25/2017  . KNEE ARTHROSCOPY  2012   Dr Theda Sers  . LUMBAR LAMINECTOMY  1987   Dr  Durward Fortes  . MYELOGRAM  2007  . PERICARDIOCENTESIS N/A 04/07/2017   Procedure: PERICARDIOCENTESIS;  Surgeon: Sherren Mocha, MD;  Location: [place] CV LAB;  Service: Cardiovascular;  Laterality: N/A;  . POLYPECTOMY    . REPAIR OF ACUTE ASCENDING THORACIC AORTIC DISSECTION N/A 03/23/2017   Procedure: REPAIR OF ACUTE ASCENDING THORACIC AORTIC DISSECTION.  Bentall procedure.  Aortic root repleacement with bioprosthetic valve.  Reimplantation of left and right coronary arteries.  Total resection of transverse aortic arch.  Elephant trunk distal anastomosis and debranching of arch vessels.;  Surgeon: Rexene Alberts, MD;  Location: [facility];  Service: Vascular;  Laterality: N/A;  . RIGHT HEART CATH N/A 06/15/2019   Procedure: RIGHT HEART CATH;  Surgeon: Larey Dresser, MD;  Location: [facility] CV LAB;  Service: Cardiovascular;  Laterality: N/A;  . ROTATOR CUFF REPAIR    . SHOULDER ARTHROSCOPY  2011   Dr Theda Sers  . SHOULDER ARTHROSCOPY Right 08/2015  . SPINAL FUSION  1986   Dr Rolin Barry  . TEE WITHOUT CARDIOVERSION N/A 03/23/2017   Procedure: TRANSESOPHAGEAL ECHOCARDIOGRAM (TEE);  Surgeon: Rexene Alberts, MD;  Location: [facility];  Service: Open Heart Surgery;  Laterality: N/A;  . THORACIC AORTIC ENDOVASCULAR STENT GRAFT N/A 03/30/2018   Procedure: THORACIC AORTIC ENDOVASCULAR STENT GRAFT WITH INTRAVASCULAR ULTRASOUND;  Surgeon: Serafina Mitchell, MD;  Location: [facility];  Service: Vascular;  Laterality: N/A;  . TRACHEOSTOMY  age 7 for croup        Objective:   Physical Exam BP (!) 130/45 (BP Location: Left Arm, Patient Position: Sitting, Cuff Size: Normal)   Pulse (!) 56   Temp (!) 97.3 F (36.3 C) (Temporal)   Resp 18   Ht 6' (1.829 m)   Wt 210 lb (95.3 kg)   SpO2 98%   BMI 28.48 kg/m  General:   Well developed, NAD, BMI noted. HEENT:  Normocephalic . Face symmetric, atraumatic Neck: No lymphadenopathies or masses. Breasts: + Breast tissue bilaterally, more noticeable on the  left.  No nipple discharge.  No skin lesions.  No axillary lymphadenopathies. Skin: Not pale. Not jaundice Neurologic:  alert & oriented X3.  Speech normal, gait appropriate for age and unassisted Psych--  Cognition and judgment appear intact.  Cooperative with normal attention span and concentration.  Behavior appropriate. No anxious or depressed appearing.      Assessment      Assessment  HTN Hyperlipidemia GERD Insomnia: xanax prn EtOH MSK: See surgeries Pulmonary  --COPD, s/p  PFTs 12/2013; 2020: Moderate to severe --Smoker, quit 03/2017 (2PPD) --CXR 12/2013 granuloma Allergies-- Dr Neldon Mc  Snoring: Reports a remote (1990s) + sleep study. Tried a cpap. [place] (-) 07-2015 CV: --P- Atrial fibrillation Dr Caryl Comes, on NSRb --lexiscan 2013 --Cardiac catheterization July 2013: Normal LV FX,mild nonobstructive CAD Dr. Angelena Form --thoracis Ao  dissection , acute, surgery 03-2017 --Aortic thoracic aneurysm endovascular stent graft 03-2018 GU:  --BPH, microscopic hematuria (negative cystoscopy 1978) --Elevated PSA: saw urology  04-2016 Gynecomastia, Dr. Loanne Drilling  >> Bx (-) 10-2014 ; took tamoxifen temporarily (self d/c 06-2016), improved Hypogonadism-- dx per Dr Loanne Drilling, numbers improved d/t nolvadex  Sees dermatology : eczema? rx taclonex H/o  Tracheostomy , age 3, croup Chronic anemia: Chronic anemia per chart review,+ iron deficient (08-2018). S/p  colonoscopy 05-2016, multiple polyps. EGD 2014 for chest pain: Negative, bx results?   PLAN: Gynecomastia: History of gynecomastia before, negative biopsy 2016, took tamoxifen for a while. Also history of hypogonadism. Plan: Refer to Dr. Loanne Drilling.  This visit occurred during the SARS-CoV-2 public health emergency.  Safety protocols were in place, including screening questions prior to the visit, additional usage of staff PPE, and extensive cleaning of exam room while observing appropriate contact time as indicated for disinfecting  solutions.

## 2019-09-18 NOTE — Assessment & Plan Note (Signed)
Gynecomastia: History of gynecomastia before, negative biopsy 2016, took tamoxifen for a while. Also history of hypogonadism. Plan: Refer to Dr. Loanne Drilling.

## 2019-09-18 NOTE — Progress Notes (Signed)
Pre visit review using our clinic review tool, if applicable. No additional management support is needed unless otherwise documented below in the visit note. 

## 2019-09-22 ENCOUNTER — Ambulatory Visit (HOSPITAL_COMMUNITY)
Admission: RE | Admit: 2019-09-22 | Discharge: 2019-09-22 | Disposition: A | Discharge status: Home / Self Care | Payer: Medicare Other | Source: Ambulatory Visit | Attending: Internal Medicine | Admitting: Internal Medicine

## 2019-09-22 ENCOUNTER — Other Ambulatory Visit: Payer: Self-pay

## 2019-09-22 DIAGNOSIS — I5042 Chronic combined systolic (congestive) and diastolic (congestive) heart failure: Secondary | ICD-10-CM | POA: Insufficient documentation

## 2019-09-22 DIAGNOSIS — I5032 Chronic diastolic (congestive) heart failure: Secondary | ICD-10-CM

## 2019-09-22 DIAGNOSIS — I5022 Chronic systolic (congestive) heart failure: Secondary | ICD-10-CM

## 2019-09-22 LAB — BASIC METABOLIC PANEL
Anion gap: 11 (ref 5–15)
BUN: 15 mg/dL (ref 8–23)
CO2: 24 mmol/L (ref 22–32)
Calcium: 9.3 mg/dL (ref 8.9–10.3)
Chloride: 95 mmol/L — ABNORMAL LOW (ref 98–111)
Creatinine, Ser: 1.6 mg/dL — ABNORMAL HIGH (ref 0.61–1.24)
GFR calc Af Amer: 50 mL/min — ABNORMAL LOW (ref >60)
GFR calc non Af Amer: 43 mL/min — ABNORMAL LOW (ref >60)
Glucose, Bld: 93 mg/dL (ref 70–99)
Potassium: 4.1 mmol/L (ref 3.5–5.1)
Sodium: 130 mmol/L — ABNORMAL LOW (ref 135–145)

## 2019-09-22 LAB — BRAIN NATRIURETIC PEPTIDE: B Natriuretic Peptide: 111.8 pg/mL — ABNORMAL HIGH (ref 0.0–100.0)

## 2019-09-25 NOTE — Progress Notes (Signed)
Virtual Visit via Telephone  Note   This visit type was conducted due to national recommendations for restrictions regarding the COVID-19 Pandemic (e.g. social distancing) in an effort to limit this patient's exposure and mitigate transmission in our community.  Due to his co-morbid illnesses, this patient is at least at moderate risk for complications without adequate follow up.  This format is felt to be most appropriate for this patient at this time.  All issues noted in this document were discussed and addressed.  A limited physical exam was performed with this format.  Please refer to the patient's chart for his consent to telehealth for [facility].   Evaluation Performed:  Follow-up visit  This visit type was conducted due to national recommendations for restrictions regarding the COVID-19 Pandemic (e.g. social distancing).  This format is felt to be most appropriate for this patient at this time.  All issues noted in this document were discussed and addressed.  No physical exam was performed (except for noted visual exam findings with Video Visits).  Please refer to the patient's chart (MyChart message for video visits and phone note for telephone visits) for the patient's consent to telehealth for [facility].  Date:  09/27/2019   ID:  Sean Riley, DOB [birth year], MRN [id number]  Patient Location:  [facility]  Provider location:   [place]  PCP:  Colon Branch, MD  Cardiologist:  [name], MD Sleep Medicine:  Fransico Him, MD Electrophysiologist:  None   Chief Complaint:  OSA  History of Present Illness:    Sean Riley is a 69 y.o. male who presents via audio/video conferencing for a telehealth visit today.    This is a 69yo male with a hx of PAF, chronic diastolic CHF, COPD and aortic dissection s/p repair who was referred for home sleep study due to excessive daytime sleepiness and snoring.  He underwent home sleep study showing mild OSA with an AHI of 11.8/hr  with most events occurring in the supine position.  There was 70 minutes of O2 sat < 88% with the lowest O2 sat 81%.  He has severe snoring during the study and no central sleep apnea.  He was started on auto CPAP and is now here for followup.   He is doing well with his CPAP device and thinks that he has gotten used to it.  He tolerates the pillow mask and feels the pressure is adequate.  Since going on CPAP he feels rested in the am and has no significant daytime sleepiness.  He denies any significant mouth or nasal dryness or nasal congestion after increasing the humidity.  He had nose bleeds in the beginning but that resolved after increasing the humidity.  He does not think that he snores.    The patient does not have symptoms concerning for COVID-19 infection (fever, chills, cough, or new shortness of breath).    Prior CV studies:   The following studies were reviewed today:  Home sleep study, OV notes from AHF clinic, PAP compliance download from [place]  Past Medical History:  Diagnosis Date  . Allergy   . Arthritis   . Ascending aortic dissection ([place]) 03/23/2017  . Atrial fibrillation ([place])   . Cataract    removed both eyes   . GERD (gastroesophageal reflux disease)   . HYPERLIPIDEMIA   . 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 Hypertension   . Microscopic hematuria    negative cystoscopy  . NONSPECIFIC  ABNORMAL ELECTROCARDIOGRAM   . Pleural effusion on right   . Pleural effusion, bilateral   . S/P aortic dissection repair 03/24/2017   Biological Bentall aortic root replacement + resection and grafting of entire ascending aorta, transverse aortic arch and proximal descending thoracic aorta with elephant trunk distal anastomosis and debranching of aortic arch vessels  . 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 Varicose veins    Past Surgical History:  Procedure Laterality Date  . APPENDECTOMY    . CARDIAC CATHETERIZATION      X3;Dr Caryl Comes, last 01/20/12: mild non-obstructive CAD, normal LV systolic function  . CARDIOVERSION N/A 05/31/2018   Procedure: CARDIOVERSION;  Surgeon: Sueanne Margarita, MD;  Location: [facility] ENDOSCOPY;  Service: Cardiovascular;  Laterality: N/A;  . COLONOSCOPY    . CYSTOSCOPY  1978   Dr Hartley Barefoot  . EYE SURGERY    . IR THORACENTESIS ASP PLEURAL SPACE W/IMG GUIDE  11/25/2017  . KNEE ARTHROSCOPY  2012   Dr Theda Sers  . LUMBAR LAMINECTOMY  1987   Dr Durward Fortes  . MYELOGRAM  2007  . PERICARDIOCENTESIS N/A 04/07/2017   Procedure: PERICARDIOCENTESIS;  Surgeon: Sherren Mocha, MD;  Location: [place] CV LAB;  Service: Cardiovascular;  Laterality: N/A;  . POLYPECTOMY    . REPAIR OF ACUTE ASCENDING THORACIC AORTIC DISSECTION N/A 03/23/2017   Procedure: REPAIR OF ACUTE ASCENDING THORACIC AORTIC DISSECTION.  Bentall procedure.  Aortic root repleacement with bioprosthetic valve.  Reimplantation of left and right coronary arteries.  Total resection of transverse aortic arch.  Elephant trunk distal anastomosis and debranching of arch vessels.;  Surgeon: Rexene Alberts, MD;  Location: [place];  Service: Vascular;  Laterality: N/A;  . RIGHT HEART CATH N/A 06/15/2019   Procedure: RIGHT HEART CATH;  Surgeon: Larey Dresser, MD;  Location: [place] CV LAB;  Service: Cardiovascular;  Laterality: N/A;  . ROTATOR CUFF REPAIR    . SHOULDER ARTHROSCOPY  2011   Dr Theda Sers  . SHOULDER ARTHROSCOPY Right 08/2015  . SPINAL FUSION  1986   Dr Rolin Barry  . TEE WITHOUT CARDIOVERSION N/A 03/23/2017   Procedure: TRANSESOPHAGEAL ECHOCARDIOGRAM (TEE);  Surgeon: Rexene Alberts, MD;  Location: [place];  Service: Open Heart Surgery;  Laterality: N/A;  . THORACIC AORTIC ENDOVASCULAR STENT GRAFT N/A 03/30/2018   Procedure: THORACIC AORTIC ENDOVASCULAR STENT GRAFT WITH  INTRAVASCULAR ULTRASOUND;  Surgeon: Serafina Mitchell, MD;  Location: [facility] OR;  Service: Vascular;  Laterality: N/A;  . TRACHEOSTOMY     age 63 for croup     Current Meds  Medication Sig  . albuterol (PROAIR HFA) 108 (90 Base) MCG/ACT inhaler Inhale 2 puffs into the lungs every 4 (four) hours as needed for wheezing or shortness of breath.  Marland Kitchen albuterol (PROVENTIL) (2.5 MG/3ML) 0.083% nebulizer solution Take 3 mLs (2.5 mg total) by nebulization every 8 (eight) hours as needed for wheezing or shortness of breath.  . ALPRAZolam (XANAX) 1 MG tablet TAKE 1-1&1/2 TABLETS BY MOUTH AT BEDTIME  . augmented betamethasone dipropionate (DIPROLENE-AF) 0.05 % cream Apply 1 application topically 2 (two) times daily as needed (itchy skin.).  Marland Kitchen azelastine (ASTELIN) 0.1 % nasal spray Place 2 sprays into both nostrils 2 (two) times daily as needed for allergies.  . diphenhydrAMINE (BENADRYL) 25 MG tablet Take 25 mg by mouth every 8 (  eight) hours as needed (allergies/runny nose).   Marland Kitchen ELIQUIS 5 MG TABS tablet TAKE 1 TABLET TWICE A DAY  . escitalopram (LEXAPRO) 10 MG tablet Take 1 tablet (10 mg total) by mouth daily.  Marland Kitchen esomeprazole (NEXIUM) 40 MG capsule Take 1 capsule (40 mg total) by mouth daily before breakfast.  . flecainide (TAMBOCOR) 50 MG tablet Take 1 tablet (50 mg total) by mouth 2 (two) times daily.  . fluticasone (FLONASE) 50 MCG/ACT nasal spray Place 1 spray into both nostrils 2 (two) times daily as needed for allergies. Use one spray in each nostril twice daily.  . fluticasone furoate-vilanterol (BREO ELLIPTA) 200-25 MCG/INH AEPB Inhale 1 puff into the lungs at bedtime.  Marland Kitchen losartan (COZAAR) 25 MG tablet Take 1 tablet (25 mg total) by mouth daily.  . metoprolol tartrate (LOPRESSOR) 25 MG tablet Take 1 tablet (25 mg total) by mouth 2 (two) times daily.  . potassium chloride SA (KLOR-CON) 20 MEQ tablet Take 2 tablets (40 mEq total) by mouth daily.  . rosuvastatin (CRESTOR) 5 MG tablet Take 1 tablet (5 mg  total) by mouth daily.  Marland Kitchen torsemide (DEMADEX) 20 MG tablet Take 3 tablets (60 mg total) by mouth daily.  Marland Kitchen umeclidinium bromide (INCRUSE ELLIPTA) 62.5 MCG/INH AEPB Inhale 1 puff into the lungs at bedtime.      Allergies:   Simvastatin, Celecoxib, Codeine, Nsaids, Tape, Cephalexin, and Latex   Social History   Tobacco Use  . Smoking status: Former Smoker    Packs/day: 2.00    Years: 45.00    Pack years: 90.00    Types: Cigarettes    Quit date: 03/22/2017    Years since quitting: 2.5  . Smokeless tobacco: Never Used  . Tobacco comment: 2 ppd , quit 03/2017  Substance Use Topics  . Alcohol use: Yes    Alcohol/week: 0.0 standard drinks    Comment: 5 cans of beer daily  . Drug use: No     Family Hx: The patient's family history includes Arrhythmia in his brother; Benign prostatic hyperplasia in his father; Breast cancer in his maternal grandmother; Heart attack in his maternal aunt and maternal grandmother; Hypertension in his father and mother. There is no history of Stroke, Diabetes, Colon cancer, Colon polyps, Stomach cancer, Rectal cancer, Esophageal cancer, or Prostate cancer.  ROS:   Please see the history of present illness.     All other systems reviewed and are negative.   Labs/Other Tests and Data Reviewed:    Recent Labs: 03/30/2019: ALT 9 06/06/2019: Platelets 237 06/15/2019: Hemoglobin 13.3; Hemoglobin 13.6 09/22/2019: B Natriuretic Peptide 111.8; BUN 15; Creatinine, Ser 1.60; Potassium 4.1; Sodium 130   Recent Lipid Panel Lab Results  Component Value Date/Time   CHOL 140 03/30/2019 12:12 PM   TRIG 97.0 03/30/2019 12:12 PM   HDL 46.00 03/30/2019 12:12 PM   CHOLHDL 3 03/30/2019 12:12 PM   LDLCALC 74 03/30/2019 12:12 PM   LDLDIRECT 137.9 04/24/2013 10:31 AM    Wt Readings from Last 3 Encounters:  09/27/19 204 lb (92.5 kg)  09/18/19 210 lb (95.3 kg)  09/12/19 209 lb 12.8 oz (95.2 kg)     Objective:    Vital Signs:  Ht 6' (1.829 m)   Wt 204 lb (92.5 kg)    BMI 27.67 kg/m     ASSESSMENT & PLAN:    1.  OSA - The pathophysiology of obstructive sleep apnea , it's cardiovascular consequences & modes of treatment including CPAP were discused with the patient in  detail & they evidenced understanding.  The patient is tolerating PAP therapy well without any problems. The PAP download was reviewed today and showed an AHI of 0.2/hr on auto PAP cm  97% compliance in using more than 4 hours nightly.  The patient has been using and benefiting from PAP use and will continue to benefit from therapy.   2.  HTN  -continue Lopresor 25mg  BID, Losartan 25mg  daily -review of outside labs from PCP on [facility] showed a creatinine of 1.6 and K+ 4.1 on 09/22/2019  3.  PAF -he denies any palpitations -continue Eliquis, Flecainide and BB -discussed the importance of being compliant with PAP to limit breakthrough of PAF   COVID-19 Education: The signs and symptoms of COVID-19 were discussed with the patient and how to seek care for testing (follow up with PCP or arrange E-visit).  The importance of social distancing was discussed today.  Patient Risk:   After full review of this patient's clinical status, I feel that they are at least moderate risk at this time.  Time:   Today, I have spent 20 minutes on telemedicine discussing medical problems including OSA, PAF, HTN and reviewing patient's chart including labs from PCP on [facility] and PAP compliance download on [facility].  Medication Adjustments/Labs and Tests Ordered: Current medicines are reviewed at length with the patient today.  Concerns regarding medicines are outlined above.   Tests Ordered: No orders of the defined types were placed in this encounter.  Medication Changes: No orders of the defined types were placed in this encounter.   Disposition:  Follow up in 1 year(s)   Fransico Him, MD  09/27/2019 1:04 PM    [place]

## 2019-09-26 ENCOUNTER — Ambulatory Visit (HOSPITAL_BASED_OUTPATIENT_CLINIC_OR_DEPARTMENT_OTHER): Payer: Medicare Other

## 2019-09-27 ENCOUNTER — Encounter: Payer: Self-pay | Admitting: Cardiology

## 2019-09-27 ENCOUNTER — Telehealth: Payer: Self-pay | Admitting: *Deleted

## 2019-09-27 ENCOUNTER — Telehealth (INDEPENDENT_AMBULATORY_CARE_PROVIDER_SITE_OTHER): Payer: Medicare Other | Admitting: Cardiology

## 2019-09-27 ENCOUNTER — Other Ambulatory Visit: Payer: Self-pay

## 2019-09-27 VITALS — Ht 72.0 in | Wt 204.0 lb

## 2019-09-27 DIAGNOSIS — I10 Essential (primary) hypertension: Secondary | ICD-10-CM | POA: Diagnosis not present

## 2019-09-27 DIAGNOSIS — G4733 Obstructive sleep apnea (adult) (pediatric): Secondary | ICD-10-CM | POA: Diagnosis not present

## 2019-09-27 DIAGNOSIS — I480 Paroxysmal atrial fibrillation: Secondary | ICD-10-CM | POA: Diagnosis not present

## 2019-09-27 NOTE — Telephone Encounter (Signed)
Informed patient of compliance results and verbalized understanding was indicated. Patient is aware and agreeable to AHI being within range. Patient is aware and agreeable to being in compliance with machine usage. Patient is aware and agreeable to no change in current pressures.

## 2019-09-27 NOTE — Telephone Encounter (Signed)
-----   Message from Sueanne Margarita, MD sent at 09/27/2019  1:49 PM EST ----- Please let patient know that his download looked great.  Followup with me in 1 year

## 2019-10-01 ENCOUNTER — Other Ambulatory Visit: Payer: Self-pay | Admitting: Internal Medicine

## 2019-10-02 ENCOUNTER — Ambulatory Visit: Payer: Medicare Other

## 2019-10-11 ENCOUNTER — Ambulatory Visit (HOSPITAL_BASED_OUTPATIENT_CLINIC_OR_DEPARTMENT_OTHER)
Admission: RE | Admit: 2019-10-11 | Discharge: 2019-10-11 | Disposition: A | Discharge status: Home / Self Care | Payer: Medicare Other | Source: Ambulatory Visit | Attending: Acute Care | Admitting: Acute Care

## 2019-10-11 ENCOUNTER — Other Ambulatory Visit: Payer: Self-pay

## 2019-10-11 DIAGNOSIS — Z122 Encounter for screening for malignant neoplasm of respiratory organs: Secondary | ICD-10-CM

## 2019-10-11 DIAGNOSIS — Z87891 Personal history of nicotine dependence: Secondary | ICD-10-CM | POA: Diagnosis not present

## 2019-10-11 DIAGNOSIS — F17210 Nicotine dependence, cigarettes, uncomplicated: Secondary | ICD-10-CM | POA: Insufficient documentation

## 2019-10-12 ENCOUNTER — Other Ambulatory Visit: Payer: Self-pay | Admitting: Internal Medicine

## 2019-10-12 NOTE — Progress Notes (Signed)
Please call patient and let them  know their  low dose Ct was read as a Lung RADS 1, negative study: no nodules or definitely benign nodules. Radiology recommendation is for a repeat LDCT in 12 months. .Please let them  know we will order and schedule their  annual screening scan for 10/2020. Please let them  know there was notation of CAD on their  scan.  Please remind the patient  that this is a non-gated exam therefore degree or severity of disease  cannot be determined. Please have them  follow up with their PCP regarding potential risk factor modification, dietary therapy or pharmacologic therapy if clinically indicated. Pt.  is  currently on statin therapy. Please place order for annual  screening scan for  10/2020 and fax results to PCP. Thanks so much.  Pt is followed by cards. The effusions are 2/2 CHF , cards is treating with lasix.  It is time for his 6 month follow up with Dr.Mannam.  Please have Dr. Matilde Bash nurse get the patient scheduled  Thanks so much

## 2019-10-13 ENCOUNTER — Other Ambulatory Visit: Payer: Self-pay | Admitting: *Deleted

## 2019-10-13 DIAGNOSIS — Z23 Encounter for immunization: Secondary | ICD-10-CM | POA: Diagnosis not present

## 2019-10-13 DIAGNOSIS — Z87891 Personal history of nicotine dependence: Secondary | ICD-10-CM

## 2019-10-16 ENCOUNTER — Other Ambulatory Visit: Payer: Self-pay

## 2019-10-16 ENCOUNTER — Encounter: Payer: Self-pay | Admitting: Endocrinology

## 2019-10-16 ENCOUNTER — Ambulatory Visit (INDEPENDENT_AMBULATORY_CARE_PROVIDER_SITE_OTHER): Payer: Medicare Other | Admitting: Endocrinology

## 2019-10-16 VITALS — BP 128/70 | HR 72 | Ht 72.0 in | Wt 211.0 lb

## 2019-10-16 DIAGNOSIS — E349 Endocrine disorder, unspecified: Secondary | ICD-10-CM | POA: Insufficient documentation

## 2019-10-16 DIAGNOSIS — I480 Paroxysmal atrial fibrillation: Secondary | ICD-10-CM | POA: Diagnosis not present

## 2019-10-16 DIAGNOSIS — N62 Hypertrophy of breast: Secondary | ICD-10-CM | POA: Diagnosis not present

## 2019-10-16 LAB — TSH: TSH: 1.65 u[IU]/mL (ref 0.35–4.50)

## 2019-10-16 NOTE — Patient Instructions (Addendum)
Blood tests are requested for you today.  We'll let you know about the results.  Please let me know if you want to take a pill for this again.  It is an old, cheap, and safe medication

## 2019-10-16 NOTE — Progress Notes (Signed)
Subjective:    Patient ID: Sean Riley, male    DOB: 1951-03-23, 69 y.o.   MRN: [id number]  HPI Pt is referred by Dr Larose Kells, for gynecomastia.  Pt was the product of a normal pregnancy and delivery.  he had puberty at the normal age.  He has 2 biological children. He has never been diagnosed with hypogonadism.  He denies any h/o infertility.  He has never had liver disease, cancer, cystic fibrosis, ulcerative colitis, BPH. Her has never taken cimetidine, calcium channel blockers, growth hormone, risperidone, hCG, opioids, androgens, 5-alpha-reductase inhibitors, cancer chemotherapy, estrogens, or ketoconazole.  He now reports 4 mos swelling at both breasts, and slight assoc pain.  He says alcohol intake has fallen to 2/day.   Past Medical History:  Diagnosis Date  . Allergy   . Arthritis   . Ascending aortic dissection ([place]) 03/23/2017  . Atrial fibrillation ([place])   . Cataract    removed both eyes   . GERD (gastroesophageal reflux disease)   . HYPERLIPIDEMIA   . 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 Hypertension   . Microscopic hematuria    negative cystoscopy  . NONSPECIFIC ABNORMAL ELECTROCARDIOGRAM   . Pleural effusion on right   . Pleural effusion, bilateral   . S/P aortic dissection repair 03/24/2017   Biological Bentall aortic root replacement + resection and grafting of entire ascending aorta, transverse aortic arch and proximal descending thoracic aorta with elephant trunk distal anastomosis and debranching of aortic arch vessels  . 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 Varicose veins     Past Surgical History:  Procedure Laterality Date  . APPENDECTOMY    . CARDIAC CATHETERIZATION      X3;Dr Caryl Comes, last 01/20/12: mild non-obstructive CAD, normal LV systolic  function  . CARDIOVERSION N/A 05/31/2018   Procedure: CARDIOVERSION;  Surgeon: Sueanne Margarita, MD;  Location: [facility] ENDOSCOPY;  Service: Cardiovascular;  Laterality: N/A;  . COLONOSCOPY    . CYSTOSCOPY  1978   Dr Hartley Barefoot  . EYE SURGERY    . IR THORACENTESIS ASP PLEURAL SPACE W/IMG GUIDE  11/25/2017  . KNEE ARTHROSCOPY  2012   Dr Theda Sers  . LUMBAR LAMINECTOMY  1987   Dr Durward Fortes  . MYELOGRAM  2007  . PERICARDIOCENTESIS N/A 04/07/2017   Procedure: PERICARDIOCENTESIS;  Surgeon: Sherren Mocha, MD;  Location: [place] CV LAB;  Service: Cardiovascular;  Laterality: N/A;  . POLYPECTOMY    . REPAIR OF ACUTE ASCENDING THORACIC AORTIC DISSECTION N/A 03/23/2017   Procedure: REPAIR OF ACUTE ASCENDING THORACIC AORTIC DISSECTION.  Bentall procedure.  Aortic root repleacement with bioprosthetic valve.  Reimplantation of left and right coronary arteries.  Total resection of transverse aortic arch.  Elephant trunk distal anastomosis and debranching of arch vessels.;  Surgeon: Rexene Alberts, MD;  Location: [place];  Service: Vascular;  Laterality: N/A;  . RIGHT HEART CATH N/A 06/15/2019   Procedure: RIGHT HEART CATH;  Surgeon: Larey Dresser, MD;  Location: [place] CV LAB;  Service: Cardiovascular;  Laterality: N/A;  . ROTATOR CUFF REPAIR    . SHOULDER ARTHROSCOPY  2011   Dr Theda Sers  . SHOULDER ARTHROSCOPY Right 08/2015  . SPINAL FUSION  1986   Dr Rolin Barry  . TEE WITHOUT CARDIOVERSION N/A 03/23/2017   Procedure:  TRANSESOPHAGEAL ECHOCARDIOGRAM (TEE);  Surgeon: Rexene Alberts, MD;  Location: [place];  Service: Open Heart Surgery;  Laterality: N/A;  . THORACIC AORTIC ENDOVASCULAR STENT GRAFT N/A 03/30/2018   Procedure: THORACIC AORTIC ENDOVASCULAR STENT GRAFT WITH INTRAVASCULAR ULTRASOUND;  Surgeon: Serafina Mitchell, MD;  Location: [facility] OR;  Service: Vascular;  Laterality: N/A;  . TRACHEOSTOMY     age 36 for croup    Social History   Socioeconomic History  . Marital status: Married    Spouse  name: Not on file  . Number of children: 2  . Years of education: Not on file  . Highest education level: Not on file  Occupational History  . 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 Smoking status: Former Smoker    Packs/day: 2.00    Years: 45.00    Pack years: 90.00    Types: Cigarettes    Quit date: 03/22/2017    Years since quitting: 2.5  . Smokeless tobacco: Never Used  . Tobacco comment: 2 ppd , quit 03/2017  Substance and Sexual Activity  . Alcohol use: Yes    Alcohol/week: 0.0 standard drinks    Comment: 5 cans of beer daily  . Drug use: No  . Sexual activity: Never  Other Topics Concern  . Not on file  Social History Narrative   Lives w/ wife   Social Determinants of Health   Financial Resource Strain:   . Difficulty of Paying Living Expenses:   Food Insecurity:   . Worried About Charity fundraiser in the Last Year:   . Arboriculturist in the Last Year:   Transportation Needs:   . Film/video editor (Medical):   Marland Kitchen Lack of Transportation (Non-Medical):   Physical Activity:   . Days of Exercise per Week:   . Minutes of Exercise per Session:   Stress:   . Feeling of Stress :   Social Connections:   . Frequency of Communication with Friends and Family:   . Frequency of Social Gatherings with Friends and Family:   . Attends Religious Services:   . Active Member of Clubs or Organizations:   . Attends Archivist Meetings:   Marland Kitchen Marital Status:   Intimate Partner Violence:   . Fear of Current or Ex-Partner:   . Emotionally Abused:   Marland Kitchen Physically Abused:   . Sexually Abused:     Current Outpatient Medications on File Prior to Visit  Medication Sig Dispense Refill  . albuterol (PROAIR HFA) 108 (90 Base) MCG/ACT inhaler Inhale 2 puffs into the lungs every 4 (four) hours as needed for wheezing or shortness of breath. 54 g 3  . albuterol (PROVENTIL) (2.5 MG/3ML) 0.083% nebulizer solution Take 3 mLs (2.5 mg total) by nebulization every 8  (eight) hours as needed for wheezing or shortness of breath. 150 mL 1  . ALPRAZolam (XANAX) 1 MG tablet TAKE 1-1&1/2 TABLETS BY MOUTH AT BEDTIME 40 tablet 2  . augmented betamethasone dipropionate (DIPROLENE-AF) 0.05 % cream Apply topically 2 (two) times daily as needed. 50 g 1  . azelastine (ASTELIN) 0.1 % nasal spray Place 2 sprays into both nostrils 2 (two) times daily as needed for allergies.    . diphenhydrAMINE (BENADRYL) 25 MG tablet Take 25 mg by mouth every 8 (eight) hours as needed (allergies/runny nose).     Marland Kitchen ELIQUIS 5 MG TABS tablet TAKE 1 TABLET TWICE A DAY 180 tablet 1  . escitalopram (LEXAPRO) 10 MG tablet  Take 1 tablet (10 mg total) by mouth daily. 90 tablet 3  . esomeprazole (NEXIUM) 40 MG capsule Take 1 capsule (40 mg total) by mouth daily before breakfast. 90 capsule 3  . flecainide (TAMBOCOR) 50 MG tablet Take 1 tablet (50 mg total) by mouth 2 (two) times daily. 270 tablet 3  . fluticasone (FLONASE) 50 MCG/ACT nasal spray Place 1 spray into both nostrils 2 (two) times daily as needed for allergies. Use one spray in each nostril twice daily.    . fluticasone furoate-vilanterol (BREO ELLIPTA) 200-25 MCG/INH AEPB Inhale 1 puff into the lungs at bedtime.    Marland Kitchen losartan (COZAAR) 25 MG tablet Take 1 tablet (25 mg total) by mouth daily. 90 tablet 1  . potassium chloride SA (KLOR-CON) 20 MEQ tablet Take 2 tablets (40 mEq total) by mouth daily. 180 tablet 2  . rosuvastatin (CRESTOR) 5 MG tablet Take 1 tablet (5 mg total) by mouth daily. 90 tablet 3  . torsemide (DEMADEX) 20 MG tablet Take 3 tablets (60 mg total) by mouth daily. 270 tablet 2  . umeclidinium bromide (INCRUSE ELLIPTA) 62.5 MCG/INH AEPB Inhale 1 puff into the lungs at bedtime.     . metoprolol tartrate (LOPRESSOR) 25 MG tablet Take 1 tablet (25 mg total) by mouth 2 (two) times daily. 180 tablet 3   No current facility-administered medications on file prior to visit.    Allergies  Allergen Reactions  . Simvastatin Other  (See Comments)    Mental status changes  . Celecoxib Other (See Comments)    GI UPSET AND INFLAMMATION  . Codeine Other (See Comments)    GI UPSET AND INFLAMMATION  . Nsaids Other (See Comments)    GI UPSET AND INFLAMMATION (can tolerate via IV)  . Tape Other (See Comments)    Medical tape and Band-Aids PULL OFF THE SKIN; please use Coban wrap  . Cephalexin Itching and Rash  . Latex Rash    Family History  Problem Relation Age of Onset  . Hypertension Mother   . Hypertension Father   . Benign prostatic hyperplasia Father        S/P TURP  . Heart attack Maternal Grandmother        MI in 30s  . Breast cancer Maternal Grandmother   . Arrhythmia Brother         X 2  . Heart attack Maternal Aunt        MI in 24s  . Stroke Neg Hx   . Diabetes Neg Hx   . Colon cancer Neg Hx   . Colon polyps Neg Hx   . Stomach cancer Neg Hx   . Rectal cancer Neg Hx   . Esophageal cancer Neg Hx   . Prostate cancer Neg Hx   . Other Neg Hx        gynecomastia    BP 128/70 (BP Location: Left Arm, Patient Position: Sitting, Cuff Size: Large)   Pulse 72   Ht 6' (1.829 m)   Wt 211 lb (95.7 kg)   SpO2 (!) 88%   BMI 28.62 kg/m   Review of Systems denies depression, erectile dysfunction, weight change, decreased urinary stream, headache and rash.  He has slight numbness of the hands.     Objective:   Physical Exam VS: see vs page GEN: no distress HEAD: head: no deformity eyes: no periorbital swelling, no proptosis external nose and ears are normal NECK: I do not appreciate a nodule in the thyroid or elsewhere in  the neck.   CHEST WALL: no deformity.  Old healed surgical scar (median sternotomy).   LUNGS: clear to auscultation.  BREASTS: slight bilat gynecomastia.   CV: reg rate and rhythm, no murmur ABD: abdomen is soft, nontender.  no hepatosplenomegaly.  not distended.  no hernia GENITALIA:  Normal male.   MUSCULOSKELETAL: muscle bulk and strength are grossly normal.  no obvious joint  swelling.  gait is steady, with a cane EXTEMITIES: no deformity is seen.  1+ bilat leg edema PULSES: no carotid bruit NEURO:  cn 2-12 grossly intact.   readily moves all 4's.  sensation is intact to touch all 4's.    SKIN:  Normal texture and temperature.  No rash or suspicious lesion is visible.   NODES:  None palpable at the neck PSYCH: alert, well-oriented.  Does not appear anxious nor depressed.  I have reviewed outside records, and summarized: Pt was noted to have gynecomastia, and referred here.  I last saw this pt in 2017.  I rx'ed Tamoxifen.  Breast swelling resolved, and testosterone normalized.  He then d/c'ed the medication.     Lab Results  Component Value Date   CREATININE 1.60 (H) 09/22/2019   BUN 15 09/22/2019   NA 130 (L) 09/22/2019   K 4.1 09/22/2019   CL 95 (L) 09/22/2019   CO2 24 09/22/2019   I have reviewed outside records, and summarized: Pt was noted to have elevated a1c, and referred here.  Radiol (2016): gynecomastia Bx (2016): also gynecomastia    Assessment & Plan:  Gynecomastia, new to me: the fact that it is bilat suggests systemic cause. I tried to order hCG, but medicare declines. alcohol intake and renal insuff: this could contribute to the above.   Patient Instructions  Blood tests are requested for you today.  We'll let you know about the results.  Please let me know if you want to take a pill for this again.  It is an old, cheap, and safe medication

## 2019-10-19 ENCOUNTER — Other Ambulatory Visit: Payer: Self-pay | Admitting: Endocrinology

## 2019-10-19 ENCOUNTER — Encounter: Payer: Self-pay | Admitting: Endocrinology

## 2019-10-19 DIAGNOSIS — N62 Hypertrophy of breast: Secondary | ICD-10-CM

## 2019-10-19 LAB — TESTOSTERONE,FREE AND TOTAL
Testosterone, Free: 6.6 pg/mL (ref 6.6–18.1)
Testosterone: 399 ng/dL (ref 264–916)

## 2019-10-21 LAB — PROLACTIN: Prolactin: 7.8 ng/mL (ref 2.0–18.0)

## 2019-10-21 LAB — ESTRADIOL, FREE
Estradiol, Free: 0.5 pg/mL — ABNORMAL HIGH
Estradiol: 23 pg/mL

## 2019-11-06 ENCOUNTER — Ambulatory Visit: Payer: Medicare Other

## 2019-11-06 ENCOUNTER — Other Ambulatory Visit: Payer: Self-pay

## 2019-11-06 ENCOUNTER — Ambulatory Visit
Admission: RE | Admit: 2019-11-06 | Discharge: 2019-11-06 | Disposition: A | Discharge status: Home / Self Care | Payer: Medicare Other | Source: Ambulatory Visit | Attending: Endocrinology | Admitting: Endocrinology

## 2019-11-06 DIAGNOSIS — R928 Other abnormal and inconclusive findings on diagnostic imaging of breast: Secondary | ICD-10-CM | POA: Diagnosis not present

## 2019-11-06 DIAGNOSIS — N62 Hypertrophy of breast: Secondary | ICD-10-CM

## 2019-11-16 ENCOUNTER — Other Ambulatory Visit: Payer: Self-pay

## 2019-11-16 ENCOUNTER — Ambulatory Visit (INDEPENDENT_AMBULATORY_CARE_PROVIDER_SITE_OTHER): Payer: Medicare Other | Admitting: Pulmonary Disease

## 2019-11-16 ENCOUNTER — Encounter: Payer: Self-pay | Admitting: Pulmonary Disease

## 2019-11-16 VITALS — BP 122/68 | HR 62 | Temp 98.3°F | Ht 72.0 in | Wt 208.4 lb

## 2019-11-16 DIAGNOSIS — J449 Chronic obstructive pulmonary disease, unspecified: Secondary | ICD-10-CM | POA: Diagnosis not present

## 2019-11-16 MED ORDER — TRELEGY ELLIPTA 200-62.5-25 MCG/INH IN AEPB: 1.0000 | INHALATION_SPRAY | Freq: Every day | RESPIRATORY_TRACT | 0 refills | Status: DC

## 2019-11-16 MED ORDER — TRELEGY ELLIPTA 200-62.5-25 MCG/INH IN AEPB: 1.0000 | INHALATION_SPRAY | Freq: Every day | RESPIRATORY_TRACT | 3 refills | Status: DC

## 2019-11-16 NOTE — Addendum Note (Signed)
Addended by: Hildred Alamin I on: 11/16/2019 02:06 PM   Modules accepted: Orders

## 2019-11-16 NOTE — Patient Instructions (Signed)
We will check your oxygen levels on exertion today We will change your inhalers to Trelegy to take place of Breo and Incruse Follow-up in 6 months. Please call sooner if there is any change in his symptoms

## 2019-11-16 NOTE — Progress Notes (Signed)
Sean Riley    [id number]    03-20-1951  Primary Care Physician:Paz, Alda Berthold, MD  Referring Physician: Colon Branch, MD [address] [place],  [place] [address]  Chief complaint: Follow-up for COPD, pleural effusions  HPI: 69 year old heavy smoker with history of ascending aortic dissection status post repair, aortic wall replacement on 03/24/17. Followed with Dr. Roxy Manns.  CT scan in January showed large bilateral effusion.  He was started on Lasix with improvement in effusion  He has followed up with Dr. Ricard Dillon and underwent thoracentesis of the right pleural effusion on 11/25/17.  I do not see any pleural studies sent from the procedure. Underwent endovascular repair of thoracic aortic aneurysm on 03/30/2018 without any respiratory issue  Pets: Dog, no birds, [place]. Occupation: Retired Museum/gallery curator. Exposures: No known exposures, no mold, he has a hot tub but does not use it Smoking history: 90-pack-year smoking history.  Quit in 2018 Travel History: Not significant  Interim history: Continues on [place] and incruse. Has chronic dyspnea on exertion, minimal cough with sputum production.  Sees Dr. Aundra Dubin from cardiology who has increased his diuretic dose.  Outpatient Encounter Medications as of 11/16/2019  Medication Sig  . albuterol (PROAIR HFA) 108 (90 Base) MCG/ACT inhaler Inhale 2 puffs into the lungs every 4 (four) hours as needed for wheezing or shortness of breath.  Marland Kitchen albuterol (PROVENTIL) (2.5 MG/3ML) 0.083% nebulizer solution Take 3 mLs (2.5 mg total) by nebulization every 8 (eight) hours as needed for wheezing or shortness of breath.  . ALPRAZolam (XANAX) 1 MG tablet TAKE 1-1&1/2 TABLETS BY MOUTH AT BEDTIME  . augmented betamethasone dipropionate (DIPROLENE-AF) 0.05 % cream Apply topically 2 (two) times daily as needed.  Marland Kitchen azelastine (ASTELIN) 0.1 % nasal spray Place 2 sprays into both nostrils 2 (two) times daily as needed for  allergies.  . diphenhydrAMINE (BENADRYL) 25 MG tablet Take 25 mg by mouth every 8 (eight) hours as needed (allergies/runny nose).   Marland Kitchen ELIQUIS 5 MG TABS tablet TAKE 1 TABLET TWICE A DAY  . escitalopram (LEXAPRO) 10 MG tablet Take 1 tablet (10 mg total) by mouth daily.  Marland Kitchen esomeprazole (NEXIUM) 40 MG capsule Take 1 capsule (40 mg total) by mouth daily before breakfast.  . flecainide (TAMBOCOR) 50 MG tablet Take 1 tablet (50 mg total) by mouth 2 (two) times daily.  . fluticasone (FLONASE) 50 MCG/ACT nasal spray Place 1 spray into both nostrils 2 (two) times daily as needed for allergies. Use one spray in each nostril twice daily.  . fluticasone furoate-vilanterol (BREO ELLIPTA) 200-25 MCG/INH AEPB Inhale 1 puff into the lungs at bedtime.  Marland Kitchen losartan (COZAAR) 25 MG tablet Take 1 tablet (25 mg total) by mouth daily.  . metoprolol tartrate (LOPRESSOR) 25 MG tablet Take 1 tablet (25 mg total) by mouth 2 (two) times daily.  . potassium chloride SA (KLOR-CON) 20 MEQ tablet Take 2 tablets (40 mEq total) by mouth daily.  . rosuvastatin (CRESTOR) 5 MG tablet Take 1 tablet (5 mg total) by mouth daily.  Marland Kitchen torsemide (DEMADEX) 20 MG tablet Take 3 tablets (60 mg total) by mouth daily.  Marland Kitchen umeclidinium bromide (INCRUSE ELLIPTA) 62.5 MCG/INH AEPB Inhale 1 puff into the lungs at bedtime.    No facility-administered encounter medications on file as of 11/16/2019.   Physical Exam: Blood pressure 130/74, pulse (!) 56, height 6' (1.829 m), weight 193 lb (87.5 kg), SpO2  94 %. Gen:      No acute distress HEENT:  EOMI, sclera anicteric Neck:     No masses; no thyromegaly Lungs:    Clear to auscultation bilaterally; normal respiratory effort CV:         Irregular, loud prosthetic equal click Abd:      + bowel sounds; soft, non-tender; no palpable masses, no distension Ext:    No edema; adequate peripheral perfusion Skin:      Warm and dry; no rash Neuro: alert and oriented x 3 Psych: normal mood and affect  Data  Reviewed: Imaging CT 03/22/17- moderate emphysema, left upper lobe 3.6 mm nodule. CT angio 08/23/17-emphysema, large-moderate right and left effusion. Chest x-ray 09/20/17- improved size of pleural effusion.  Minimal basilar atelectasis, hyperinflation. Screening CT 10/11/2019-lobular emphysema, calcified granuloma in the right upper lobe.  Small partially loculated bilateral effusions with scarring. I have reviewed the images personally.  PFTs 01/05/17-FVC 4.02 [80%], FEV1 3.03 [81%], F/F 75 12/24/15-FVC 4.27 [91%], FEV1 2.39 [86%], F/F 79 Normal spirometry  12/25/13 FVC 4.96 [103%], FEV1 3.51 [97%], F/F 71, p.o. 314%, DLCO 72% Minimal obstruction, small airway disease, minimal diffusion defect  01/25/2018 FVC 2.92 (62%], FEV1 1.98 [57%], F/F 68, TLC 66%, DLCO 32% Moderate-severe obstruction, severe restriction and diffusion defect.  Labs Blood allergy profile 01/25/2018- IgE 841, sensitive to grass, cat, dog, tree pollen CBC 01/25/2018- WBC 6.7, eos 4.8%, absolute eosinophil count 300 Alpha-1 antitrypsin 01/25/2018- 166, PI MM  Assessment:  COPD PFTs reviewed with moderate-severe obstruction He desatted on exertion today and will start him on supplemental oxygen Start Trelegy instead of Breo and Incruse.  Pleural effusions Last CT scan reviewed with bilateral effusions that are partially loculated with some scarring These effusions are present since aortic surgery in 2018.  Suspect this is postoperative fluid collections which are chronic now.  Also may have a component of CHF secondary to chronic diastolic heart failure, paroxysmal atrial fibrillation. No evidence of interstitial lung disease. Would not recommend any invasive procedure at present Continue monitoring.  OSA Continue CPAP.  Health maintenance Pneumovax 10/16/13, Prevnar 05/10/14  Plan/Recommendations: - Stop Breo, Incruse.  Start Trelegy - Start supplemental oxygen  [name] MD [facility] 11/16/2019, 12:03 PM  CC: Colon Branch, MD

## 2019-11-20 ENCOUNTER — Telehealth: Payer: Self-pay | Admitting: Endocrinology

## 2019-11-20 ENCOUNTER — Other Ambulatory Visit: Payer: Self-pay | Admitting: Endocrinology

## 2019-11-20 MED ORDER — TAMOXIFEN CITRATE 10 MG PO TABS: 10.0000 mg | ORAL_TABLET | Freq: Two times a day (BID) | ORAL | 0 refills | Status: DC

## 2019-11-20 NOTE — Telephone Encounter (Signed)
Ok, I have sent a prescription to your pharmacy 

## 2019-11-20 NOTE — Telephone Encounter (Signed)
Please advise 

## 2019-11-20 NOTE — Telephone Encounter (Signed)
Pt is calling regarding appt on 10/16/19, it states on the note and the AVS: Please let me know if you want to take a pill for this again.  It is an old, cheap, and safe medication  Not sure what medication this statement is referring to but they are interested in him getting this medication  [facility]

## 2019-11-21 NOTE — Telephone Encounter (Signed)
Outpatient Medication Detail   Disp Refills Start End   tamoxifen (NOLVADEX) 10 MG tablet 180 tablet 0 11/20/2019    Sig - Route: Take 1 tablet (10 mg total) by mouth 2 (two) times daily. - Oral   Sent to pharmacy as: tamoxifen (NOLVADEX) 10 MG tablet   E-Prescribing Status: Receipt confirmed by pharmacy (11/20/2019  3:39 PM EDT)

## 2019-11-24 ENCOUNTER — Other Ambulatory Visit: Payer: Self-pay

## 2019-11-24 ENCOUNTER — Encounter: Payer: Self-pay | Admitting: *Deleted

## 2019-11-24 ENCOUNTER — Ambulatory Visit (INDEPENDENT_AMBULATORY_CARE_PROVIDER_SITE_OTHER): Payer: Medicare Other | Admitting: *Deleted

## 2019-11-24 DIAGNOSIS — Z0000 Encounter for general adult medical examination without abnormal findings: Secondary | ICD-10-CM | POA: Diagnosis not present

## 2019-11-24 NOTE — Patient Instructions (Addendum)
Please schedule your next medicare wellness visit with me in 1 yr.  Continue to eat heart healthy diet (full of fruits, vegetables, whole grains, lean protein, water--limit salt, fat, and sugar intake) and increase physical activity as tolerated.  Continue doing brain stimulating activities (puzzles, reading, adult coloring books, staying active) to keep memory sharp.    Sean Riley , Thank you for taking time to come for your Medicare Wellness Visit. I appreciate your ongoing commitment to your health goals. Please review the following plan we discussed and let me know if I can assist you in the future.   These are the goals we discussed: Goals    . Eat a diet lower in sodium. Decrease lower extremity swelling (pt-stated)       This is a list of the screening recommended for you and due dates:  Health Maintenance  Topic Date Due  . Tetanus Vaccine  02/18/2018  . Colon Cancer Screening  05/15/2019  . Pneumonia vaccines (2 of 2 - PPSV23) 08/10/2020*  . Flu Shot  03/10/2020  .  Hepatitis C: One time screening is recommended by Center for Disease Control  (CDC) for  adults born from [birth year] through 1966.   Completed  *Topic was postponed. The date shown is not the original due date.    Preventive Care 65 Years and Older, Male Preventive care refers to lifestyle choices and visits with your health care provider that can promote health and wellness. This includes:  A yearly physical exam. This is also called an annual well check.  Regular dental and eye exams.  Immunizations.  Screening for certain conditions.  Healthy lifestyle choices, such as diet and exercise. What can I expect for my preventive care visit? Physical exam Your health care provider will check:  Height and weight. These may be used to calculate body mass index (BMI), which is a measurement that tells if you are at a healthy weight.  Heart rate and blood pressure.  Your skin for abnormal  spots. Counseling Your health care provider may ask you questions about:  Alcohol, tobacco, and drug use.  Emotional well-being.  Home and relationship well-being.  Sexual activity.  Eating habits.  History of falls.  Memory and ability to understand (cognition).  Work and work Statistician. What immunizations do I need?  Influenza (flu) vaccine  This is recommended every year. Tetanus, diphtheria, and pertussis (Tdap) vaccine  You may need a Td booster every 10 years. Varicella (chickenpox) vaccine  You may need this vaccine if you have not already been vaccinated. Zoster (shingles) vaccine  You may need this after age 68. Pneumococcal conjugate (PCV13) vaccine  One dose is recommended after age 42. Pneumococcal polysaccharide (PPSV23) vaccine  One dose is recommended after age 70. Measles, mumps, and rubella (MMR) vaccine  You may need at least one dose of MMR if you were born in 1957 or later. You may also need a second dose. Meningococcal conjugate (MenACWY) vaccine  You may need this if you have certain conditions. Hepatitis A vaccine  You may need this if you have certain conditions or if you travel or work in places where you may be exposed to hepatitis A. Hepatitis B vaccine  You may need this if you have certain conditions or if you travel or work in places where you may be exposed to hepatitis B. Haemophilus influenzae type b (Hib) vaccine  You may need this if you have certain conditions. You may receive vaccines as individual doses or  as more than one vaccine together in one shot (combination vaccines). Talk with your health care provider about the risks and benefits of combination vaccines. What tests do I need? Blood tests  Lipid and cholesterol levels. These may be checked every 5 years, or more frequently depending on your overall health.  Hepatitis C test.  Hepatitis B test. Screening  Lung cancer screening. You may have this screening  every year starting at age 42 if you have a 30-pack-year history of smoking and currently smoke or have quit within the past 15 years.  Colorectal cancer screening. All adults should have this screening starting at age 32 and continuing until age 11. Your health care provider may recommend screening at age 44 if you are at increased risk. You will have tests every 1-10 years, depending on your results and the type of screening test.  Prostate cancer screening. Recommendations will vary depending on your family history and other risks.  Diabetes screening. This is done by checking your blood sugar (glucose) after you have not eaten for a while (fasting). You may have this done every 1-3 years.  Abdominal aortic aneurysm (AAA) screening. You may need this if you are a current or former smoker.  Sexually transmitted disease (STD) testing. Follow these instructions at home: Eating and drinking  Eat a diet that includes fresh fruits and vegetables, whole grains, lean protein, and low-fat dairy products. Limit your intake of foods with high amounts of sugar, saturated fats, and salt.  Take vitamin and mineral supplements as recommended by your health care provider.  Do not drink alcohol if your health care provider tells you not to drink.  If you drink alcohol: ? Limit how much you have to 0-2 drinks a day. ? Be aware of how much alcohol is in your drink. In the [place] one drink equals one 12 oz bottle of beer (355 mL), one 5 oz glass of wine (148 mL), or one 1 oz glass of hard liquor (44 mL). Lifestyle  Take daily care of your teeth and gums.  Stay active. Exercise for at least 30 minutes on 5 or more days each week.  Do not use any products that contain nicotine or tobacco, such as cigarettes, e-cigarettes, and chewing tobacco. If you need help quitting, ask your health care provider.  If you are sexually active, practice safe sex. Use a condom or other form of protection to prevent STIs  (sexually transmitted infections).  Talk with your health care provider about taking a low-dose aspirin or statin. What's next?  Visit your health care provider once a year for a well check visit.  Ask your health care provider how often you should have your eyes and teeth checked.  Stay up to date on all vaccines. This information is not intended to replace advice given to you by your health care provider. Make sure you discuss any questions you have with your health care provider. Document Revised: 07/21/2018 Document Reviewed: 07/21/2018 Elsevier Patient Education  2020 [employer].

## 2019-11-24 NOTE — Progress Notes (Signed)
Nurse connected with patient 11/24/19 at  2:30 PM EDT by a telephone enabled telemedicine application and verified that I am speaking with the correct person using two identifiers. Patient stated full name and DOB. Patient gave permission to continue with virtual visit. Patient's location was at home and Nurse's location was at [place] office.   Subjective:   TAIDEN MARCHANT is a 69 y.o. male who presents for Medicare Annual/Subsequent preventive examination.  Review of Systems:   Home Safety/Smoke Alarms: Feels safe in home. Smoke alarms in place.  Lives w/ wife in 1 story home. Has medical alert necklace. Wears O2 @2l  as needed. CPAP at night. Uses cane when going out.    Male:   CCS- 05/14/16. Recall 3 yrs. Declines.     PSA-  Lab Results  Component Value Date   PSA 0.78 12/14/2017   PSA 7.18 (H) 01/10/2016   PSA 0.53 05/10/2014       Objective:    Vitals: Unable to assess. This visit is enabled though telemedicine due to Covid 19.   Advanced Directives 11/24/2019 06/15/2019 05/31/2018 04/25/2018 02/14/2018 09/14/2017 03/29/2017  Does Patient Have a Medical Advance Directive? No No No No No No No  Would patient like information on creating a medical advance directive? No - Patient declined No - Patient declined No - Patient declined - No - Patient declined No - Patient declined No - Patient declined  Pre-existing out of facility DNR order (yellow form or pink MOST form) - - - - - - -    Tobacco Social History   Tobacco Use  Smoking Status Former Smoker  . Packs/day: 2.00  . Years: 45.00  . Pack years: 90.00  . Types: Cigarettes  . Quit date: 03/22/2017  . Years since quitting: 2.6  Smokeless Tobacco Never Used  Tobacco Comment   2 ppd , quit 03/2017     Counseling given: Not Answered Comment: 2 ppd , quit 03/2017   Clinical Intake: Pain : No/denies pain      Past Medical History:  Diagnosis Date  . Allergy   . Arthritis   . Ascending aortic dissection ([place])  03/23/2017  . Atrial fibrillation ([place])   . Cataract    removed both eyes   . GERD (gastroesophageal reflux disease)   . HYPERLIPIDEMIA   . 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 Hypertension   . Microscopic hematuria    negative cystoscopy  . NONSPECIFIC ABNORMAL ELECTROCARDIOGRAM   . Pleural effusion on right   . Pleural effusion, bilateral   . S/P aortic dissection repair 03/24/2017   Biological Bentall aortic root replacement + resection and grafting of entire ascending aorta, transverse aortic arch and proximal descending thoracic aorta with elephant trunk distal anastomosis and debranching of aortic arch vessels  . 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 Varicose veins    Past Surgical History:  Procedure Laterality Date  . APPENDECTOMY    . CARDIAC CATHETERIZATION      X3;Dr Caryl Comes, last 01/20/12: mild non-obstructive CAD, normal LV systolic function  . CARDIOVERSION N/A 05/31/2018   Procedure: CARDIOVERSION;  Surgeon: Sueanne Margarita, MD;  Location: [facility] ENDOSCOPY;  Service: Cardiovascular;  Laterality: N/A;  . COLONOSCOPY    . CYSTOSCOPY  1978   Dr Hartley Barefoot  .  EYE SURGERY    . IR THORACENTESIS ASP PLEURAL SPACE W/IMG GUIDE  11/25/2017  . KNEE ARTHROSCOPY  2012   Dr Theda Sers  . LUMBAR LAMINECTOMY  1987   Dr Durward Fortes  . MYELOGRAM  2007  . PERICARDIOCENTESIS N/A 04/07/2017   Procedure: PERICARDIOCENTESIS;  Surgeon: Sherren Mocha, MD;  Location: [place] CV LAB;  Service: Cardiovascular;  Laterality: N/A;  . POLYPECTOMY    . REPAIR OF ACUTE ASCENDING THORACIC AORTIC DISSECTION N/A 03/23/2017   Procedure: REPAIR OF ACUTE ASCENDING THORACIC AORTIC DISSECTION.  Bentall procedure.  Aortic root repleacement with bioprosthetic valve.  Reimplantation of left and right  coronary arteries.  Total resection of transverse aortic arch.  Elephant trunk distal anastomosis and debranching of arch vessels.;  Surgeon: Rexene Alberts, MD;  Location: [place];  Service: Vascular;  Laterality: N/A;  . RIGHT HEART CATH N/A 06/15/2019   Procedure: RIGHT HEART CATH;  Surgeon: Larey Dresser, MD;  Location: [place] CV LAB;  Service: Cardiovascular;  Laterality: N/A;  . ROTATOR CUFF REPAIR    . SHOULDER ARTHROSCOPY  2011   Dr Theda Sers  . SHOULDER ARTHROSCOPY Right 08/2015  . SPINAL FUSION  1986   Dr Rolin Barry  . TEE WITHOUT CARDIOVERSION N/A 03/23/2017   Procedure: TRANSESOPHAGEAL ECHOCARDIOGRAM (TEE);  Surgeon: Rexene Alberts, MD;  Location: [place];  Service: Open Heart Surgery;  Laterality: N/A;  . THORACIC AORTIC ENDOVASCULAR STENT GRAFT N/A 03/30/2018   Procedure: THORACIC AORTIC ENDOVASCULAR STENT GRAFT WITH INTRAVASCULAR ULTRASOUND;  Surgeon: Serafina Mitchell, MD;  Location: [facility] OR;  Service: Vascular;  Laterality: N/A;  . TRACHEOSTOMY     age 69 for croup   Family History  Problem Relation Age of Onset  . Hypertension Mother   . Hypertension Father   . Benign prostatic hyperplasia Father        S/P TURP  . Heart attack Maternal Grandmother        MI in 61s  . Breast cancer Maternal Grandmother   . Arrhythmia Brother         X 2  . Heart attack Maternal Aunt        MI in 78s  . Stroke Neg Hx   . Diabetes Neg Hx   . Colon cancer Neg Hx   . Colon polyps Neg Hx   . Stomach cancer Neg Hx   . Rectal cancer Neg Hx   . Esophageal cancer Neg Hx   . Prostate cancer Neg Hx   . Other Neg Hx        gynecomastia   Social History   Socioeconomic History  . Marital status: Married    Spouse name: Not on file  . Number of children: 2  . Years of education: Not on file  . Highest education level: Not on file  Occupational History  . 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 Smoking status: Former Smoker    Packs/day: 2.00    Years: 45.00    Pack  years: 90.00    Types: Cigarettes    Quit date: 03/22/2017    Years since quitting: 2.6  . Smokeless tobacco: Never Used  . Tobacco comment: 2 ppd , quit 03/2017  Substance and Sexual Activity  . Alcohol use: Not Currently    Alcohol/week: 0.0 standard drinks    Comment: 5 cans of beer daily  . Drug use: No  . Sexual activity: Never  Other Topics Concern  . Not on file  Social History Narrative   Lives w/ wife   Social Determinants of Health   Financial Resource Strain:   . Difficulty of Paying Living Expenses:   Food Insecurity:   . Worried About Charity fundraiser in the Last Year:   . Arboriculturist in the Last Year:   Transportation Needs:   . Film/video editor (Medical):   Marland Kitchen Lack of Transportation (Non-Medical):   Physical Activity:   . Days of Exercise per Week:   . Minutes of Exercise per Session:   Stress:   . Feeling of Stress :   Social Connections:   . Frequency of Communication with Friends and Family:   . Frequency of Social Gatherings with Friends and Family:   . Attends Religious Services:   . Active Member of Clubs or Organizations:   . Attends Archivist Meetings:   Marland Kitchen Marital Status:     Outpatient Encounter Medications as of 11/24/2019  Medication Sig  . albuterol (PROAIR HFA) 108 (90 Base) MCG/ACT inhaler Inhale 2 puffs into the lungs every 4 (four) hours as needed for wheezing or shortness of breath.  Marland Kitchen albuterol (PROVENTIL) (2.5 MG/3ML) 0.083% nebulizer solution Take 3 mLs (2.5 mg total) by nebulization every 8 (eight) hours as needed for wheezing or shortness of breath.  . ALPRAZolam (XANAX) 1 MG tablet TAKE 1-1&1/2 TABLETS BY MOUTH AT BEDTIME  . augmented betamethasone dipropionate (DIPROLENE-AF) 0.05 % cream Apply topically 2 (two) times daily as needed.  Marland Kitchen azelastine (ASTELIN) 0.1 % nasal spray Place 2 sprays into both nostrils 2 (two) times daily as needed for allergies.  . diphenhydrAMINE (BENADRYL) 25 MG tablet Take 25 mg by  mouth every 8 (eight) hours as needed (allergies/runny nose).   Marland Kitchen ELIQUIS 5 MG TABS tablet TAKE 1 TABLET TWICE A DAY  . escitalopram (LEXAPRO) 10 MG tablet Take 1 tablet (10 mg total) by mouth daily.  Marland Kitchen esomeprazole (NEXIUM) 40 MG capsule Take 1 capsule (40 mg total) by mouth daily before breakfast.  . flecainide (TAMBOCOR) 50 MG tablet Take 1 tablet (50 mg total) by mouth 2 (two) times daily.  . fluticasone (FLONASE) 50 MCG/ACT nasal spray Place 1 spray into both nostrils 2 (two) times daily as needed for allergies. Use one spray in each nostril twice daily.  . Fluticasone-Umeclidin-Vilant (TRELEGY ELLIPTA) 200-62.5-25 MCG/INH AEPB Inhale 1 puff into the lungs daily.  . Fluticasone-Umeclidin-Vilant (TRELEGY ELLIPTA) 200-62.5-25 MCG/INH AEPB Inhale 1 puff into the lungs daily.  Marland Kitchen losartan (COZAAR) 25 MG tablet Take 1 tablet (25 mg total) by mouth daily.  . potassium chloride SA (KLOR-CON) 20 MEQ tablet Take 2 tablets (40 mEq total) by mouth daily.  . rosuvastatin (CRESTOR) 5 MG tablet Take 1 tablet (5 mg total) by mouth daily.  . tamoxifen (NOLVADEX) 10 MG tablet Take 1 tablet (10 mg total) by mouth 2 (two) times daily.  Marland Kitchen torsemide (DEMADEX) 20 MG tablet Take 3 tablets (60 mg total) by mouth daily.  . metoprolol tartrate (LOPRESSOR) 25 MG tablet Take 1 tablet (25 mg total) by mouth 2 (two) times daily.   No facility-administered encounter medications on file as of 11/24/2019.    Activities of Daily Living In your present state of health, do you have any difficulty performing the following activities: 11/24/2019 03/30/2019  Hearing? N N  Vision? N N  Difficulty concentrating or making decisions? N N  Walking or climbing stairs? N N  Dressing or bathing? N N  Doing errands, shopping?  N N  Preparing Food and eating ? N -  Using the Toilet? N -  In the past six months, have you accidently leaked urine? N -  Do you have problems with loss of bowel control? N -  Managing your Medications? N -    Managing your Finances? N -  Housekeeping or managing your Housekeeping? Y -  Some recent data might be hidden    Patient Care Team: Colon Branch, MD as PCP - General (Internal Medicine) Sueanne Margarita, MD as PCP - Sleep Medicine (Cardiology) Deboraha Sprang, MD as Consulting Physician (Cardiology) Darleen Crocker, MD as Consulting Physician (Ophthalmology) Loletha Carrow, Kirke Corin, MD as Consulting Physician (Gastroenterology) Neldon Mc, Donnamarie Poag, MD as Consulting Physician (Allergy and Immunology) Rexene Alberts, MD as Consulting Physician (Cardiothoracic Surgery) Larey Dresser, MD as Consulting Physician (Cardiology)   Assessment:   This is a routine wellness examination for Krista. Physical assessment deferred to PCP.  Exercise Activities and Dietary recommendations Current Exercise Habits: The patient does not participate in regular exercise at present Diet (meal preparation, eat out, water intake, caffeinated beverages, dairy products, fruits and vegetables): 24 hr recall Breakfast: ham biscuit Lunch: skipped Dinner:  Steak and potato  Goals    . Eat a diet lower in sodium. Decrease lower extremity swelling (pt-stated)       Fall Risk Fall Risk  11/24/2019 03/30/2019 09/22/2018 09/14/2017 07/15/2017  Falls in the past year? 0 1 1 Yes No  Comment - - - - Emmi Telephone Survey: data to providers prior to load  Number falls in past yr: 0 1 1 1  -  Injury with Fall? 0 0 0 No -  Risk for fall due to : - - - Impaired balance/gait -  Follow up Education provided;Falls prevention discussed - - Education provided;Falls prevention discussed -    Depression Screen PHQ 2/9 Scores 11/24/2019 09/22/2018 09/14/2017 06/21/2017  PHQ - 2 Score 0 0 0 0  PHQ- 9 Score - 0 - -  Exception Documentation - - - -    Cognitive Function Ad8 score reviewed for issues:  Issues making decisions:no  Less interest in hobbies / activities:no  Repeats questions, stories (family complaining):no  Trouble using  ordinary gadgets (microwave, computer, phone):no  Forgets the month or year: no  Mismanaging finances:  no  Remembering appts:no  Daily problems with thinking and/or memory:no Ad8 score is=0    MMSE - Mini Mental State Exam 09/14/2017  Orientation to time 5  Orientation to Place 5  Registration 3  Attention/ Calculation 5  Recall 3  Language- name 2 objects 2  Language- repeat 1  Language- follow 3 step command 3  Language- read & follow direction 1  Write a sentence 1  Copy design 1  Total score 30        Immunization History  Administered Date(s) Administered  . Fluad Quad(high Dose 65+) 04/27/2019  . Influenza, High Dose Seasonal PF 06/05/2015, 05/18/2017, 05/18/2018  . Influenza,inj,Quad PF,6+ Mos 04/24/2013, 05/10/2014  . Influenza-Unspecified 06/10/2016  . Pneumococcal Conjugate-13 05/10/2014  . Pneumococcal Polysaccharide-23 10/16/2013  . Td 07/09/2006, 02/19/2008  . Zoster 08/10/2014    Screening Tests Health Maintenance  Topic Date Due  . TETANUS/TDAP  02/18/2018  . COLONOSCOPY  05/15/2019  . PNA vac Low Risk Adult (2 of 2 - PPSV23) 08/10/2020 (Originally 10/17/2018)  . INFLUENZA VACCINE  03/10/2020  . Hepatitis C Screening  Completed     Plan:    Please schedule  your next medicare wellness visit with me in 1 yr.  Continue to eat heart healthy diet (full of fruits, vegetables, whole grains, lean protein, water--limit salt, fat, and sugar intake) and increase physical activity as tolerated.  Continue doing brain stimulating activities (puzzles, reading, adult coloring books, staying active) to keep memory sharp.    I have personally reviewed and noted the following in the patient's chart:   . Medical and social history . Use of alcohol, tobacco or illicit drugs  . Current medications and supplements . Functional ability and status . Nutritional status . Physical activity . Advanced directives . List of other physicians . Hospitalizations,  surgeries, and ER visits in previous 12 months . Vitals . Screenings to include cognitive, depression, and falls . Referrals and appointments  In addition, I have reviewed and discussed with patient certain preventive protocols, quality metrics, and best practice recommendations. A written personalized care plan for preventive services as well as general preventive health recommendations were provided to patient.     Naaman Plummer [place], [place]  11/24/2019

## 2019-12-07 ENCOUNTER — Other Ambulatory Visit: Payer: Self-pay | Admitting: Internal Medicine

## 2019-12-13 ENCOUNTER — Encounter (HOSPITAL_COMMUNITY): Payer: Self-pay | Admitting: Cardiology

## 2019-12-13 ENCOUNTER — Ambulatory Visit (HOSPITAL_COMMUNITY)
Admission: RE | Admit: 2019-12-13 | Discharge: 2019-12-13 | Disposition: A | Discharge status: Home / Self Care | Payer: Medicare Other | Source: Ambulatory Visit | Attending: Cardiology | Admitting: Cardiology

## 2019-12-13 ENCOUNTER — Telehealth (HOSPITAL_COMMUNITY): Payer: Self-pay

## 2019-12-13 ENCOUNTER — Other Ambulatory Visit: Payer: Self-pay

## 2019-12-13 ENCOUNTER — Ambulatory Visit (HOSPITAL_BASED_OUTPATIENT_CLINIC_OR_DEPARTMENT_OTHER)
Admission: RE | Admit: 2019-12-13 | Discharge: 2019-12-13 | Disposition: A | Discharge status: Home / Self Care | Payer: Medicare Other | Source: Ambulatory Visit | Attending: Cardiology | Admitting: Cardiology

## 2019-12-13 VITALS — BP 124/60 | HR 52 | Wt 208.4 lb

## 2019-12-13 DIAGNOSIS — I110 Hypertensive heart disease with heart failure: Secondary | ICD-10-CM | POA: Diagnosis not present

## 2019-12-13 DIAGNOSIS — Z79899 Other long term (current) drug therapy: Secondary | ICD-10-CM | POA: Diagnosis not present

## 2019-12-13 DIAGNOSIS — I739 Peripheral vascular disease, unspecified: Secondary | ICD-10-CM | POA: Diagnosis not present

## 2019-12-13 DIAGNOSIS — I5032 Chronic diastolic (congestive) heart failure: Secondary | ICD-10-CM | POA: Diagnosis not present

## 2019-12-13 DIAGNOSIS — I4891 Unspecified atrial fibrillation: Secondary | ICD-10-CM | POA: Diagnosis not present

## 2019-12-13 DIAGNOSIS — I5022 Chronic systolic (congestive) heart failure: Secondary | ICD-10-CM | POA: Diagnosis not present

## 2019-12-13 DIAGNOSIS — Z953 Presence of xenogenic heart valve: Secondary | ICD-10-CM | POA: Diagnosis not present

## 2019-12-13 DIAGNOSIS — G4733 Obstructive sleep apnea (adult) (pediatric): Secondary | ICD-10-CM | POA: Insufficient documentation

## 2019-12-13 DIAGNOSIS — I10 Essential (primary) hypertension: Secondary | ICD-10-CM

## 2019-12-13 DIAGNOSIS — Z7901 Long term (current) use of anticoagulants: Secondary | ICD-10-CM | POA: Insufficient documentation

## 2019-12-13 DIAGNOSIS — J449 Chronic obstructive pulmonary disease, unspecified: Secondary | ICD-10-CM | POA: Insufficient documentation

## 2019-12-13 DIAGNOSIS — Z8249 Family history of ischemic heart disease and other diseases of the circulatory system: Secondary | ICD-10-CM | POA: Diagnosis not present

## 2019-12-13 DIAGNOSIS — Z87891 Personal history of nicotine dependence: Secondary | ICD-10-CM | POA: Insufficient documentation

## 2019-12-13 DIAGNOSIS — E871 Hypo-osmolality and hyponatremia: Secondary | ICD-10-CM | POA: Diagnosis not present

## 2019-12-13 DIAGNOSIS — I471 Supraventricular tachycardia: Secondary | ICD-10-CM

## 2019-12-13 DIAGNOSIS — I480 Paroxysmal atrial fibrillation: Secondary | ICD-10-CM | POA: Diagnosis not present

## 2019-12-13 LAB — BASIC METABOLIC PANEL
Anion gap: 12 (ref 5–15)
BUN: 19 mg/dL (ref 8–23)
CO2: 26 mmol/L (ref 22–32)
Calcium: 8.9 mg/dL (ref 8.9–10.3)
Chloride: 90 mmol/L — ABNORMAL LOW (ref 98–111)
Creatinine, Ser: 2.01 mg/dL — ABNORMAL HIGH (ref 0.61–1.24)
GFR calc Af Amer: 38 mL/min — ABNORMAL LOW (ref >60)
GFR calc non Af Amer: 33 mL/min — ABNORMAL LOW (ref >60)
Glucose, Bld: 76 mg/dL (ref 70–99)
Potassium: 4.7 mmol/L (ref 3.5–5.1)
Sodium: 128 mmol/L — ABNORMAL LOW (ref 135–145)

## 2019-12-13 LAB — LIPID PANEL
Cholesterol: 136 mg/dL (ref 0–200)
HDL: 45 mg/dL (ref >40)
LDL Cholesterol: 74 mg/dL (ref 0–99)
Total CHOL/HDL Ratio: 3 RATIO
Triglycerides: 84 mg/dL (ref <150)
VLDL: 17 mg/dL (ref 0–40)

## 2019-12-13 LAB — BRAIN NATRIURETIC PEPTIDE: B Natriuretic Peptide: 142.7 pg/mL — ABNORMAL HIGH (ref 0.0–100.0)

## 2019-12-13 MED ORDER — TORSEMIDE 20 MG PO TABS: 80.0000 mg | ORAL_TABLET | Freq: Every day | ORAL | 2 refills | Status: DC

## 2019-12-13 NOTE — Telephone Encounter (Signed)
Pt was in office this morning, Reds vest 41%.  Per Dr Sean Riley pt needs to increase torsemide to 80mg  (4 tabs) daily and rtc in 10 days for repeat blood work. Pt also needs f/u in 1 month with NP. Spoke with wife, aware of new instructions. Verbalized understanding. Pt scheduled for appts as ordered.

## 2019-12-13 NOTE — Progress Notes (Signed)
ReDS Vest / Clip - 12/13/19 1200      ReDS Vest / Clip   Station Marker  C    Ruler Value  31    ReDS Value Range  (!) High volume overload    ReDS Actual Value  41    Anatomical Comments  sitting

## 2019-12-13 NOTE — Telephone Encounter (Signed)
error 

## 2019-12-13 NOTE — Progress Notes (Signed)
PCP: Colon Branch, MD Cardiology: Dr. Caryl Comes HF Cardiology: Dr. Aundra Dubin  69 y.o. with history of COPD, paroxysmal atrial fibrillation, type A aortic dissection, and chronic diastolic CHF was referred by Dr. Caryl Comes for evaluation of CHF.  Prior to 2018, patient reports no significant limitations.  In 8/18, he had a Type A aortic dissection.  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  The dissection extended down to the abdominal aorta.  He was a heavy smoker, quit in 8/18.  He developed paroxysmal atrial fibrillation, had DCCV in 10/19.  He is on Eliquis and flecainide now.  Last echo in 2/20 showed EF 55%, mildly decreased RV systolic function, normally functioning bioprosthetic aortic valve.  PFTs in 6/19 showed moderate-severe obstruction.    Ever since his operation in 2018, Sean Riley has had significant exertional dyspnea and has been quite limited.    He had RHC in 11/20, showing normal filling pressures, preserved cardiac output, and mild primarily pulmonary venous hypertension.    PYP scan was not suggestive of cardiac amyloidosis.   He saw pulmonary, PFTs showed moderate to severe obstruction.  He is using oxygen with exertion now, which he says helps. He is using CPAP.   Echo was done today and reviewed, EF 55%, basal inferior akinesis, mild RV dilation with normal function, PASP 46 mmHg, s/p Bentall, s/p bioprosthetic aortic valve with mean gradient 16 mmHg.   He returns for followup of CHF.  He is still limited but feels better than he did a few months ago. He gets short of breath walking around the house but is able to go farther before stopping. Weight is down 1 lb.  Using oxygen with exertion which seems to help.  Less fatigued now that he is using CPAP. Still not very active. Still sleeping with head elevated.   ECG (personally reviewed): NSR, LAFB, rSR' V1  REDS clip 41%  Labs (8/20): LDL 74 Labs (10/20): Na 126, K 4.5,  creatinine 1.15 Labs (11/20): K 3.8, creatinine 1.3 Labs (12/20): K 3.9, creatinine 1.5, myeloma panel negative Labs (3/21): K 4.1, creatinine 1.6, TSH normal Labs (5/21): BNP 142, creatinine 2  PMH: 1. COPD: PFTs (6/19) with moderate-severe obstruction.  Prior heavy smoker, quit 2018.  - Uses oxygen with exertion.  2. Atrial fibrillation: Paroxysmal. DCCV in 10/19, now on flecainide.  3. HTN 4. Chronic diastolic CHF: Echo (A999333) with EF 55%, mild LVH, mild LV dilation, mild RV dilation with mildly decreased systolic function, bioprosthetic aortic valve with mean gradient 15 mmHg.  - RHC (11/20): mean RA 9, PA 41/15, mean PCWP 13, CI 2.81, PVR 2.15 WU.   - PYP scan not suggestive of cardiac amyloidosis.  - Echo (5/21): EF 55%, basal inferior akinesis, mild RV dilation with normal function, PASP 46 mmHg, s/p Bentall, s/p bioprosthetic aortic valve with mean gradient 16 mmHg.  5. Type A aortic dissection: 8/18, patient had emergency repair with Bentall/bioprosthetic aortic valve.  Dissection extends down to abdominal aorta.  Coronaries re-implanted. Complicated by pericardial effusion requiring pericardiocentesis.  6. Chronic hyponatremia.  7. OSA: He is using CPAP.   FH: Uncle with aortic dissection, mother with SCD at age 45.   SH: Lives on [place] farm in [place].  Married, prior heavy smoker but quit in 2018.   ROS: All systems reviewed and negative except as per HPI.   Current Outpatient Medications  Medication Sig Dispense Refill  . albuterol (PROAIR HFA) 108 (90 Base) MCG/ACT inhaler  Inhale 2 puffs into the lungs every 4 (four) hours as needed for wheezing or shortness of breath. 54 g 3  . albuterol (PROVENTIL) (2.5 MG/3ML) 0.083% nebulizer solution Take 3 mLs (2.5 mg total) by nebulization every 8 (eight) hours as needed for wheezing or shortness of breath. 150 mL 1  . ALPRAZolam (XANAX) 1 MG tablet TAKE 1-1&1/2 TABLETS BY MOUTH AT BEDTIME 40 tablet 2  . augmented betamethasone  dipropionate (DIPROLENE-AF) 0.05 % cream Apply topically 2 (two) times daily as needed. 50 g 1  . azelastine (ASTELIN) 0.1 % nasal spray Place 2 sprays into both nostrils 2 (two) times daily as needed for allergies.    . diphenhydrAMINE (BENADRYL) 25 MG tablet Take 25 mg by mouth every 8 (eight) hours as needed (allergies/runny nose).     Marland Kitchen ELIQUIS 5 MG TABS tablet TAKE 1 TABLET TWICE A DAY 180 tablet 1  . escitalopram (LEXAPRO) 10 MG tablet Take 1 tablet (10 mg total) by mouth daily. 90 tablet 3  . esomeprazole (NEXIUM) 40 MG capsule Take 1 capsule (40 mg total) by mouth daily before breakfast. 90 capsule 3  . flecainide (TAMBOCOR) 50 MG tablet Take 1 tablet (50 mg total) by mouth 2 (two) times daily. 270 tablet 3  . fluticasone (FLONASE) 50 MCG/ACT nasal spray Place 1 spray into both nostrils 2 (two) times daily as needed for allergies. Use one spray in each nostril twice daily.    . Fluticasone-Umeclidin-Vilant (TRELEGY ELLIPTA) 200-62.5-25 MCG/INH AEPB Inhale 1 puff into the lungs daily. 180 each 3  . losartan (COZAAR) 25 MG tablet Take 1 tablet (25 mg total) by mouth daily. 90 tablet 1  . metoprolol tartrate (LOPRESSOR) 25 MG tablet Take 1 tablet (25 mg total) by mouth 2 (two) times daily. 180 tablet 3  . potassium chloride SA (KLOR-CON) 20 MEQ tablet Take 2 tablets (40 mEq total) by mouth daily. 180 tablet 2  . rosuvastatin (CRESTOR) 5 MG tablet Take 1 tablet (5 mg total) by mouth daily. 90 tablet 3  . tamoxifen (NOLVADEX) 10 MG tablet Take 1 tablet (10 mg total) by mouth 2 (two) times daily. 180 tablet 0  . torsemide (DEMADEX) 20 MG tablet Take 4 tablets (80 mg total) by mouth daily. 360 tablet 2   No current facility-administered medications for this encounter.   BP 124/60   Pulse (!) 52   Wt 94.5 kg (208 lb 6.4 oz)   SpO2 94%   BMI 28.26 kg/m  General: NAD Neck: No JVD, no thyromegaly or thyroid nodule.  Lungs: Slight crackles at bases CV: Nondisplaced PMI.  Heart regular S1/S2,  no S3/S4, 2/6 SEM RUSB with clear S2.  No peripheral edema.  No carotid bruit.  Difficult to palpate pedal pulses.  Abdomen: Soft, nontender, no hepatosplenomegaly, no distention.  Skin: Intact without lesions or rashes.  Neurologic: Alert and oriented x 3.  Psych: Normal affect. Extremities: No clubbing or cyanosis.  HEENT: Normal.   Assessment/Plan: 1. Atrial fibrillation: Paroxysmal.  He is currently in NSR.  - Continue Eliquis 5 mg bid.  - Continue flecainide + metoprolol.  2. Chronic diastolic CHF: Echo in A999333 with EF 55%, mildly decreased RV systolic function, normally functioning bioprosthetic aortic valve. RHC in 11/20 after increasing torsemide showed near-normal filling pressures.  PYP study was not suggestive of ATTR amyloidosis and myeloma panel was negative.  Echo was done today and reviewed, EF 55%, mild RV dilation/normal function, bioprosthetic aortic valve mean gradient 16 mmHg.  He is  improved but symptoms are still NYHA class III.  I suspect that COPD also contributes to his exercise intolerance. On exam, he does not look volume overloaded and BNP is not significantly increased.  However, REDS clip is high. As creatinine is up to 2 today, I am not going to increase his diuretic.   - Continue torsemide 60 mg daily.   - Would be good Cardiomems candidate except he has not been hospitalized in the last year.  - Needs to increase activity, discussed regular use of his stationary bike.  3. COPD: Moderate to severe obstruction on 6/19 PFTs, prior smoker.  As above, I suspect this contributes to his dyspnea.  - Continue followup with pulmonary.  4. [id number] CPAP.    5. Aortic dissection: S/p Type A aortic dissection, s/p Bentall with bioprosthetic aortic valve. Echo today showed stable bioprosthetic aortic valve.  Dr. Trula Slade has been following his residual dissected aorta.   - Given family history of aortic disease (uncle with aortic dissection, mother with SCD at age 64 - ?from  dissection), his children's aortas need to be screened due to risk for inherited connective tissue disorder.  We have discussed this.  He is not Marfanoid.   6. Hyponatremia: Cut back on po fluid intake.  7. PAD: Difficult to palpate pedal pulses.   - Need to order peripheral arterial dopplers.   Followup NP/PA in 1 month to reassess volume.   [name] 12/13/2019

## 2019-12-13 NOTE — Progress Notes (Signed)
  Echocardiogram 2D Echocardiogram has been performed.  Sean Riley [name] 12/13/2019, 10:56 AM

## 2019-12-13 NOTE — Patient Instructions (Signed)
No medication changes today!  Labs today We will only contact you if something comes back abnormal or we need to make some changes. Otherwise no news is good news!  You have been referred to Pulmonary Rehab. They will call you to schedule this appointment.  Your physician recommends that you schedule a follow-up appointment in: 3 months with Dr Aundra Dubin  Please call office at [phone] option [phone] if you have any questions or concerns.   At the [facility], you and your health needs are our priority. As part of our continuing mission to provide you with exceptional heart care, we have created designated Provider Care Teams. These Care Teams include your primary Cardiologist (physician) and Advanced Practice Providers (APPs- Physician Assistants and Nurse Practitioners) who all work together to provide you with the care you need, when you need it.   You may see any of the following providers on your designated Care Team at your next follow up: Marland Kitchen Dr Glori Bickers . Dr [name] . Darrick Grinder, NP . Lyda Jester, PA . Audry Riles, PharmD   Please be sure to bring in all your medications bottles to every appointment.

## 2019-12-14 ENCOUNTER — Telehealth (HOSPITAL_COMMUNITY): Payer: Self-pay | Admitting: *Deleted

## 2019-12-14 MED ORDER — TORSEMIDE 20 MG PO TABS: 60.0000 mg | ORAL_TABLET | Freq: Every day | ORAL | 2 refills | Status: DC

## 2019-12-14 NOTE — Telephone Encounter (Signed)
-----   Message from Larey Dresser, MD sent at 12/13/2019  3:46 PM EDT ----- Cut back on po fluid intake with low sodium. Keep torsemide dose at 60 mg daily (had asked him to increase to 80 mg daily today but creatinine higher).

## 2019-12-14 NOTE — Telephone Encounter (Signed)
Harvie Junior, [place]  12/14/2019 10:55 AM EDT    Pts wife called back she is aware and agreeable with plan.    Valeda Malm, RN  12/13/2019 4:14 PM EDT    LM for wife to return call   Larey Dresser, MD  12/13/2019 3:46 PM EDT    Cut back on po fluid intake with low sodium. Keep torsemide dose at 60 mg daily (had asked him to increase to 80 mg daily today but creatinine higher).

## 2019-12-18 ENCOUNTER — Encounter (HOSPITAL_COMMUNITY): Payer: Self-pay | Admitting: *Deleted

## 2019-12-18 ENCOUNTER — Other Ambulatory Visit (HOSPITAL_COMMUNITY): Payer: Self-pay

## 2019-12-18 ENCOUNTER — Telehealth (HOSPITAL_COMMUNITY): Payer: Self-pay

## 2019-12-18 DIAGNOSIS — I5031 Acute diastolic (congestive) heart failure: Secondary | ICD-10-CM

## 2019-12-18 DIAGNOSIS — I5032 Chronic diastolic (congestive) heart failure: Secondary | ICD-10-CM

## 2019-12-18 NOTE — Progress Notes (Signed)
Received referral from Dr. Aundra Dubin  for this pt to participate in pulmonary rehab with the the diagnosis of Diastolic Heart Failure. Pt with EF 55%. Pt known to cardiac rehab staff from previous participation where he completed 36 sessions in 2018/2019.  Clinical review of pt follow up appt on 5/5 with Heart Failure clinic office note.  Pt with Covid Risk Score - 7 Pt appropriate for scheduling for Pulmonary rehab.  Will forward to support staff for verification of insurance eligibility/benefits and to pulmonary rehab staff for scheduling orientation/walk test/exercise with pt consent. Cherre Huger, BSN Cardiac and Training and development officer

## 2019-12-18 NOTE — Telephone Encounter (Signed)
Pt insurance is active and benefits verified through Medicare A/B. Co-pay $0.00, DED $203.00/$203.00 met, out of pocket $0.00/$0.00 met, co-insurance 20%. No pre-authorization required. 12/18/19  2ndary insurance is active and benefits verified through [employer]. Co-pay $0.00, DED $0.00/$0.00 met, out of pocket $0.00/$0.00 met, co-insurance 0%. No pre-authorization required. 12/18/19  Will pass to [place] for scheduling. 

## 2019-12-21 ENCOUNTER — Telehealth (HOSPITAL_COMMUNITY): Payer: Self-pay

## 2019-12-21 NOTE — Telephone Encounter (Signed)
Attempted to call patient in regards to Cardiac Rehab - LM on VM 

## 2019-12-25 ENCOUNTER — Encounter: Payer: Self-pay | Admitting: Internal Medicine

## 2019-12-25 ENCOUNTER — Other Ambulatory Visit: Payer: Self-pay

## 2019-12-25 ENCOUNTER — Ambulatory Visit (INDEPENDENT_AMBULATORY_CARE_PROVIDER_SITE_OTHER): Payer: Medicare Other | Admitting: Internal Medicine

## 2019-12-25 VITALS — BP 151/57 | HR 58 | Temp 96.9°F | Resp 18 | Ht 72.0 in | Wt 206.2 lb

## 2019-12-25 DIAGNOSIS — I509 Heart failure, unspecified: Secondary | ICD-10-CM | POA: Diagnosis not present

## 2019-12-25 DIAGNOSIS — I10 Essential (primary) hypertension: Secondary | ICD-10-CM

## 2019-12-25 DIAGNOSIS — D649 Anemia, unspecified: Secondary | ICD-10-CM | POA: Diagnosis not present

## 2019-12-25 DIAGNOSIS — L308 Other specified dermatitis: Secondary | ICD-10-CM | POA: Diagnosis not present

## 2019-12-25 DIAGNOSIS — N62 Hypertrophy of breast: Secondary | ICD-10-CM

## 2019-12-25 DIAGNOSIS — I712 Thoracic aortic aneurysm, without rupture, unspecified: Secondary | ICD-10-CM

## 2019-12-25 LAB — CBC WITH DIFFERENTIAL/PLATELET
Basophils Absolute: 0 10*3/uL (ref 0.0–0.1)
Basophils Relative: 0.6 % (ref 0.0–3.0)
Eosinophils Absolute: 0.2 10*3/uL (ref 0.0–0.7)
Eosinophils Relative: 4.2 % (ref 0.0–5.0)
HCT: 35.4 % — ABNORMAL LOW (ref 39.0–52.0)
Hemoglobin: 12.1 g/dL — ABNORMAL LOW (ref 13.0–17.0)
Lymphocytes Relative: 11.5 % — ABNORMAL LOW (ref 12.0–46.0)
Lymphs Abs: 0.5 10*3/uL — ABNORMAL LOW (ref 0.7–4.0)
MCHC: 34.3 g/dL (ref 30.0–36.0)
MCV: 99.5 fl (ref 78.0–100.0)
Monocytes Absolute: 0.5 10*3/uL (ref 0.1–1.0)
Monocytes Relative: 13.3 % — ABNORMAL HIGH (ref 3.0–12.0)
Neutro Abs: 2.8 10*3/uL (ref 1.4–7.7)
Neutrophils Relative %: 70.4 % (ref 43.0–77.0)
Platelets: 165 10*3/uL (ref 150.0–400.0)
RBC: 3.56 Mil/uL — ABNORMAL LOW (ref 4.22–5.81)
RDW: 13.6 % (ref 11.5–15.5)
WBC: 4 10*3/uL (ref 4.0–10.5)

## 2019-12-25 MED ORDER — TETANUS-DIPHTH-ACELL PERTUSSIS 5-2.5-18.5 LF-MCG/0.5 IM SUSP: 0.5000 mL | Freq: Once | INTRAMUSCULAR | 0 refills | Status: AC

## 2019-12-25 MED ORDER — SHINGRIX 50 MCG/0.5ML IM SUSR: 0.5000 mL | Freq: Once | INTRAMUSCULAR | 1 refills | Status: AC

## 2019-12-25 MED ORDER — BETAMETHASONE DIPROPIONATE AUG 0.05 % EX CREA: TOPICAL_CREAM | Freq: Two times a day (BID) | CUTANEOUS | 1 refills | Status: DC | PRN

## 2019-12-25 NOTE — Progress Notes (Signed)
Pre visit review using our clinic review tool, if applicable. No additional management support is needed unless otherwise documented below in the visit note. 

## 2019-12-25 NOTE — Assessment & Plan Note (Signed)
HTN: No recent ambulatory BPs.  Recommend to monitor BPs weekly. Hyperlipidemia : last cholesterol panel in excellent.  Continue Crestor. CHF: Saw cardiology 12/13/2019.  Felt to be stable Gynecomastia: Saw endocrinology 10/16/2019, labs were drawn, mammogram negative, on tamoxifen, follow-up per Endo COPD, pleural effusions, OSA:  Saw pulmonary 11/16/2019, patient was a stable, they felt pleural effusion were related to previous surgery on chronic.  Rec no further aggressive evaluation Eczema? numular lesions, left foot, refill Diprolene. Anemia: Check a CBC and TIBC Preventive: Recommend Tdap and Shingrix.  Prescriptions printed RTC 4 months

## 2019-12-25 NOTE — Patient Instructions (Addendum)
Check the  blood pressure 2 or 3 times a week BP GOAL is between 110/65 and  135/85. If it is consistently higher or lower, let me know  Apply the cream to the left foot daily   GO TO THE LAB : Get the blood work     [place], [place] back for a checkup in 4 months

## 2019-12-25 NOTE — Progress Notes (Signed)
Subjective:    Patient ID: Sean Riley, male    DOB: February 04, 1951, 69 y.o.   MRN: [id number]  DOS:  12/25/2019 Type of visit - description: Follow-up, here with his wife Since the last office visit he is doing well. Note from cardiology, Endo and pulmonary reviewed.  BP Readings from Last 3 Encounters:  12/25/19 (!) 151/57  12/13/19 124/60  11/16/19 122/68     Review of Systems Continue with skin lesions, left foot. Denies chest pain.  S OB: At baseline, mild. Lower extremity edema seems controlled. No cough Using a CPAP most nights  Past Medical History:  Diagnosis Date  . Allergy   . Arthritis   . Ascending aortic dissection ([place]) 03/23/2017  . Atrial fibrillation ([place])   . Cataract    removed both eyes   . GERD (gastroesophageal reflux disease)   . HYPERLIPIDEMIA   . 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 Hypertension   . Microscopic hematuria    negative cystoscopy  . NONSPECIFIC ABNORMAL ELECTROCARDIOGRAM   . Pleural effusion on right   . Pleural effusion, bilateral   . S/P aortic dissection repair 03/24/2017   Biological Bentall aortic root replacement + resection and grafting of entire ascending aorta, transverse aortic arch and proximal descending thoracic aorta with elephant trunk distal anastomosis and debranching of aortic arch vessels  . 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 Varicose veins     Past Surgical History:  Procedure Laterality Date  . APPENDECTOMY    . CARDIAC CATHETERIZATION      X3;Dr Caryl Comes, last 01/20/12: mild non-obstructive CAD, normal LV systolic function  . CARDIOVERSION N/A 05/31/2018   Procedure: CARDIOVERSION;  Surgeon: Sueanne Margarita, MD;  Location[facility] END[facility]COPY;  Service: Cardiovascular;  Laterality: N/A;  .  COLONOSCOPY    . CYSTOSCOPY  1978   Dr Hartley Barefoot  . EYE SURGERY    . IR THORACENTESIS ASP PLEURAL SPACE W/IMG GUIDE  11/25/2017  . KNEE ARTHROSCOPY  2012   Dr Theda Sers  . LUMBAR LAMINECTOMY  1987   Dr Durward Fortes  . MYELOGRAM  2007  . PERICARDIOCENTESIS N/A 04/07/2017   Procedure: PERICARDIOCENTESIS;  Surgeon: Sherren Mocha, MD;  Location: [facility] CV LAB;  Service: Cardiovascular;  Laterality: N/A;  . POLYPECTOMY    . REPAIR OF ACUTE ASCENDING THORACIC AORTIC DISSECTION N/A 03/23/2017   Procedure: REPAIR OF ACUTE ASCENDING THORACIC AORTIC DISSECTION.  Bentall procedure.  Aortic root repleacement with bioprosthetic valve.  Reimplantation of left and right coronary arteries.  Total resection of transverse aortic arch.  Elephant trunk distal anastomosis and debranching of arch vessels.;  Surgeon: Rexene Alberts, MD;  Location: [facility];  Service: Vascular;  Laterality: N/A;  . RIGHT HEART CATH N/A 06/15/2019   Procedure: RIGHT HEART CATH;  Surgeon: Larey Dresser, MD;  Location: [place] CV LAB;  Service: Cardiovascular;  Laterality: N/A;  . ROTATOR CUFF REPAIR    . SHOULDER ARTHROSCOPY  2011   Dr Theda Sers  . SHOULDER ARTHROSCOPY Right 08/2015  . SPINAL FUSION  1986   Dr Rolin Barry  . TEE WITHOUT CARDIOVERSION N/A 03/23/2017   Procedure: TRANSESOPHAGEAL ECHOCARDIOGRAM (TEE);  Surgeon: Rexene Alberts, MD;  Location: [facility];  Service: Open Heart Surgery;  Laterality: N/A;  . THORACIC AORTIC  ENDOVASCULAR STENT GRAFT N/A 03/30/2018   Procedure: THORACIC AORTIC ENDOVASCULAR STENT GRAFT WITH INTRAVASCULAR ULTRASOUND;  Surgeon: Serafina Mitchell, MD;  Location: [facility] OR;  Service: Vascular;  Laterality: N/A;  . TRACHEOSTOMY     age 19 for croup    Allergies as of 12/25/2019      Reactions   Simvastatin Other (See Comments)   Mental status changes   Celecoxib Other (See Comments)   GI UPSET AND INFLAMMATION   Codeine Other (See Comments)   GI UPSET AND INFLAMMATION   Nsaids Other (See Comments)     GI UPSET AND INFLAMMATION (can tolerate via IV)   Tape Other (See Comments)   Medical tape and Band-Aids PULL OFF THE SKIN; please use Coban wrap   Cephalexin Itching, Rash   Latex Rash      Medication List       Accurate as of Dec 25, 2019 11:05 AM. If you have any questions, ask your nurse or doctor.        albuterol (2.5 MG/3ML) 0.083% nebulizer solution Commonly known as: PROVENTIL Take 3 mLs (2.5 mg total) by nebulization every 8 (eight) hours as needed for wheezing or shortness of breath.   albuterol 108 (90 Base) MCG/ACT inhaler Commonly known as: ProAir HFA Inhale 2 puffs into the lungs every 4 (four) hours as needed for wheezing or shortness of breath.   ALPRAZolam 1 MG tablet Commonly known as: XANAX TAKE 1-1&1/2 TABLETS BY MOUTH AT BEDTIME   augmented betamethasone dipropionate 0.05 % cream Commonly known as: DIPROLENE-AF Apply topically 2 (two) times daily as needed.   azelastine 0.1 % nasal spray Commonly known as: ASTELIN Place 2 sprays into both nostrils 2 (two) times daily as needed for allergies.   diphenhydrAMINE 25 MG tablet Commonly known as: BENADRYL Take 25 mg by mouth every 8 (eight) hours as needed (allergies/runny nose).   Eliquis 5 MG Tabs tablet Generic drug: apixaban TAKE 1 TABLET TWICE A DAY   escitalopram 10 MG tablet Commonly known as: LEXAPRO Take 1 tablet (10 mg total) by mouth daily.   esomeprazole 40 MG capsule Commonly known as: NexIUM Take 1 capsule (40 mg total) by mouth daily before breakfast.   flecainide 50 MG tablet Commonly known as: TAMBOCOR Take 1 tablet (50 mg total) by mouth 2 (two) times daily.   fluticasone 50 MCG/ACT nasal spray Commonly known as: FLONASE Place 1 spray into both nostrils 2 (two) times daily as needed for allergies. Use one spray in each nostril twice daily.   losartan 25 MG tablet Commonly known as: COZAAR Take 1 tablet (25 mg total) by mouth daily.   metoprolol tartrate 25 MG  tablet Commonly known as: LOPRESSOR Take 1 tablet (25 mg total) by mouth 2 (two) times daily.   potassium chloride SA 20 MEQ tablet Commonly known as: KLOR-CON Take 2 tablets (40 mEq total) by mouth daily.   rosuvastatin 5 MG tablet Commonly known as: Crestor Take 1 tablet (5 mg total) by mouth daily.   Shingrix injection Generic drug: Zoster Vaccine Adjuvanted Inject 0.5 mLs into the muscle once for 1 dose.   tamoxifen 10 MG tablet Commonly known as: NOLVADEX Take 1 tablet (10 mg total) by mouth 2 (two) times daily.   Tdap 5-2.5-18.5 LF-MCG/0.5 injection Commonly known as: BOOSTRIX Inject 0.5 mLs into the muscle once for 1 dose. Started by: Kathlene November, MD   torsemide 20 MG tablet Commonly known as: DEMADEX Take 3 tablets (60 mg total) by mouth  daily.   Trelegy Ellipta 200-62.5-25 MCG/INH Aepb Generic drug: Fluticasone-Umeclidin-Vilant Inhale 1 puff into the lungs daily.          Objective:   Physical Exam Musculoskeletal:       Feet:    BP (!) 151/57 (BP Location: Left Arm, Patient Position: Sitting, Cuff Size: Normal)   Pulse (!) 58   Temp (!) 96.9 F (36.1 C) (Temporal)   Resp 18   Ht 6' (1.829 m)   Wt 206 lb 4 oz (93.6 kg)   SpO2 94%   BMI 27.97 kg/m  General:   Well developed, NAD, BMI noted. HEENT:  Normocephalic . Face symmetric, atraumatic Lungs:  Decreased breath sounds Normal respiratory effort, no intercostal retractions, no accessory muscle use. Heart: Seems regular, + murmur. Lower extremities: no pretibial edema bilaterally  Skin: Not pale. Not jaundice Neurologic:  alert & oriented X3.  Speech normal, gait slow, assisted by a cane, somewhat wide-based. Psych--  Cognition and judgment appear intact.  Cooperative with normal attention span and concentration.  Behavior appropriate. No anxious or depressed appearing.      Assessment     Assessment  HTN Hyperlipidemia GERD Insomnia: xanax prn MSK: See surgeries Pulmonary   --COPD, s/p  PFTs 12/2013; 2020: Moderate to severe --Smoker, quit 03/2017 (2PPD) -- OSA , on Cpap Allergies-- Dr Neldon Mc  CV: --P- Atrial fibrillation Dr Caryl Comes, on NSRb --Cardiac catheterization July 2013: Normal LV FX,mild nonobstructive CAD Dr. 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 Loanne Drilling  >> Bx (-) 10-2014 ; took tamoxifen temporarily (self d/c 06-2016) ---Again 09/2019, labs/MMG done, on [place] agai Dermatology : eczema? rx taclonex H/o  Tracheostomy , age 68, croup Chronic anemia: Chronic anemia per chart review,+ iron deficient (08-2018). S/p  colonoscopy 05-2016, multiple polyps. EGD 2014 for chest pain: Negative, bx results?  PLAN: HTN: No recent ambulatory BPs.  Recommend to monitor BPs weekly. Hyperlipidemia : last cholesterol panel in excellent.  Continue Crestor. CHF: Saw cardiology 12/13/2019.  Felt to be stable Gynecomastia: Saw endocrinology 10/16/2019, labs were drawn, mammogram negative, on tamoxifen, follow-up per Endo COPD, pleural effusions, OSA:  Saw pulmonary 11/16/2019, patient was a stable, they felt pleural effusion were related to previous surgery on chronic.  Rec no further aggressive evaluation Eczema? numular lesions, left foot, refill Diprolene. Anemia: Check a CBC and TIBC Preventive: Recommend Tdap and Shingrix.  Prescriptions printed RTC 4 months    This visit occurred during the SARS-CoV-2 public health emergency.  Safety protocols were in place, including screening questions prior to the visit, additional usage of staff PPE, and extensive cleaning of exam room while observing appropriate contact time as indicated for disinfecting solutions.

## 2019-12-26 ENCOUNTER — Ambulatory Visit (HOSPITAL_COMMUNITY)
Admission: RE | Admit: 2019-12-26 | Discharge: 2019-12-26 | Disposition: A | Discharge status: Home / Self Care | Payer: Medicare Other | Source: Ambulatory Visit | Attending: Internal Medicine | Admitting: Internal Medicine

## 2019-12-26 ENCOUNTER — Telehealth (HOSPITAL_COMMUNITY): Payer: Self-pay

## 2019-12-26 DIAGNOSIS — I4891 Unspecified atrial fibrillation: Secondary | ICD-10-CM | POA: Diagnosis not present

## 2019-12-26 DIAGNOSIS — I5032 Chronic diastolic (congestive) heart failure: Secondary | ICD-10-CM

## 2019-12-26 LAB — IRON,TIBC AND FERRITIN PANEL
%SAT: 23 % (calc) (ref 20–48)
Ferritin: 173 ng/mL (ref 24–380)
Iron: 70 ug/dL (ref 50–180)
TIBC: 307 mcg/dL (calc) (ref 250–425)

## 2019-12-26 LAB — BASIC METABOLIC PANEL
Anion gap: 11 (ref 5–15)
BUN: 22 mg/dL (ref 8–23)
CO2: 26 mmol/L (ref 22–32)
Calcium: 8.8 mg/dL — ABNORMAL LOW (ref 8.9–10.3)
Chloride: 90 mmol/L — ABNORMAL LOW (ref 98–111)
Creatinine, Ser: 2.15 mg/dL — ABNORMAL HIGH (ref 0.61–1.24)
GFR calc Af Amer: 35 mL/min — ABNORMAL LOW (ref >60)
GFR calc non Af Amer: 30 mL/min — ABNORMAL LOW (ref >60)
Glucose, Bld: 102 mg/dL — ABNORMAL HIGH (ref 70–99)
Potassium: 4.3 mmol/L (ref 3.5–5.1)
Sodium: 127 mmol/L — ABNORMAL LOW (ref 135–145)

## 2019-12-26 MED ORDER — TORSEMIDE 20 MG PO TABS: ORAL_TABLET | ORAL | 2 refills | Status: DC

## 2019-12-26 NOTE — Telephone Encounter (Signed)
Spoke with patients wife. Aware of lab results and instructions to monitor fluid intake, reduce torsemide to 40mg  alternating with 60mg  every other day. Pt will repeat labs in 10- days. . Verbalized understanding via repeat back.

## 2019-12-26 NOTE — Telephone Encounter (Signed)
-----   Message from Larey Dresser, MD sent at 12/26/2019  1:57 PM EDT ----- Continue to watch po fluid intake, sodium is lower.  Decrease torsemide to 40 daily alternating with 60 daily.  BMET 10 days.

## 2020-01-03 ENCOUNTER — Telehealth (HOSPITAL_COMMUNITY): Payer: Self-pay | Admitting: *Deleted

## 2020-01-04 ENCOUNTER — Telehealth (HOSPITAL_COMMUNITY): Payer: Self-pay | Admitting: *Deleted

## 2020-01-05 ENCOUNTER — Ambulatory Visit (HOSPITAL_COMMUNITY)
Admission: RE | Admit: 2020-01-05 | Discharge: 2020-01-05 | Disposition: A | Discharge status: Home / Self Care | Payer: Medicare Other | Source: Ambulatory Visit | Attending: Cardiology | Admitting: Cardiology

## 2020-01-05 ENCOUNTER — Other Ambulatory Visit: Payer: Self-pay

## 2020-01-05 DIAGNOSIS — I5032 Chronic diastolic (congestive) heart failure: Secondary | ICD-10-CM | POA: Diagnosis not present

## 2020-01-05 LAB — BASIC METABOLIC PANEL
Anion gap: 11 (ref 5–15)
BUN: 18 mg/dL (ref 8–23)
CO2: 25 mmol/L (ref 22–32)
Calcium: 8.6 mg/dL — ABNORMAL LOW (ref 8.9–10.3)
Chloride: 95 mmol/L — ABNORMAL LOW (ref 98–111)
Creatinine, Ser: 1.97 mg/dL — ABNORMAL HIGH (ref 0.61–1.24)
GFR calc Af Amer: 39 mL/min — ABNORMAL LOW (ref >60)
GFR calc non Af Amer: 33 mL/min — ABNORMAL LOW (ref >60)
Glucose, Bld: 93 mg/dL (ref 70–99)
Potassium: 3.9 mmol/L (ref 3.5–5.1)
Sodium: 131 mmol/L — ABNORMAL LOW (ref 135–145)

## 2020-01-15 ENCOUNTER — Ambulatory Visit (HOSPITAL_COMMUNITY)
Admission: RE | Admit: 2020-01-15 | Discharge: 2020-01-15 | Disposition: A | Discharge status: Home / Self Care | Payer: Medicare Other | Source: Ambulatory Visit | Attending: Cardiology | Admitting: Cardiology

## 2020-01-15 ENCOUNTER — Other Ambulatory Visit: Payer: Self-pay

## 2020-01-15 ENCOUNTER — Telehealth (HOSPITAL_COMMUNITY): Payer: Self-pay | Admitting: Cardiology

## 2020-01-15 ENCOUNTER — Encounter (HOSPITAL_COMMUNITY): Payer: Self-pay

## 2020-01-15 VITALS — BP 102/64 | HR 56 | Wt 202.0 lb

## 2020-01-15 DIAGNOSIS — I480 Paroxysmal atrial fibrillation: Secondary | ICD-10-CM | POA: Insufficient documentation

## 2020-01-15 DIAGNOSIS — I5032 Chronic diastolic (congestive) heart failure: Secondary | ICD-10-CM

## 2020-01-15 DIAGNOSIS — J449 Chronic obstructive pulmonary disease, unspecified: Secondary | ICD-10-CM | POA: Insufficient documentation

## 2020-01-15 DIAGNOSIS — Z87891 Personal history of nicotine dependence: Secondary | ICD-10-CM | POA: Insufficient documentation

## 2020-01-15 DIAGNOSIS — Z7901 Long term (current) use of anticoagulants: Secondary | ICD-10-CM | POA: Insufficient documentation

## 2020-01-15 DIAGNOSIS — Z8249 Family history of ischemic heart disease and other diseases of the circulatory system: Secondary | ICD-10-CM | POA: Insufficient documentation

## 2020-01-15 DIAGNOSIS — I110 Hypertensive heart disease with heart failure: Secondary | ICD-10-CM | POA: Diagnosis not present

## 2020-01-15 DIAGNOSIS — Z79899 Other long term (current) drug therapy: Secondary | ICD-10-CM | POA: Insufficient documentation

## 2020-01-15 DIAGNOSIS — Z953 Presence of xenogenic heart valve: Secondary | ICD-10-CM | POA: Diagnosis not present

## 2020-01-15 DIAGNOSIS — I739 Peripheral vascular disease, unspecified: Secondary | ICD-10-CM | POA: Diagnosis not present

## 2020-01-15 DIAGNOSIS — Z7951 Long term (current) use of inhaled steroids: Secondary | ICD-10-CM | POA: Insufficient documentation

## 2020-01-15 DIAGNOSIS — G4733 Obstructive sleep apnea (adult) (pediatric): Secondary | ICD-10-CM | POA: Diagnosis not present

## 2020-01-15 LAB — CBC
HCT: 36.2 % — ABNORMAL LOW (ref 39.0–52.0)
Hemoglobin: 12.2 g/dL — ABNORMAL LOW (ref 13.0–17.0)
MCH: 33.6 pg (ref 26.0–34.0)
MCHC: 33.7 g/dL (ref 30.0–36.0)
MCV: 99.7 fL (ref 80.0–100.0)
Platelets: 167 10*3/uL (ref 150–400)
RBC: 3.63 MIL/uL — ABNORMAL LOW (ref 4.22–5.81)
RDW: 12.8 % (ref 11.5–15.5)
WBC: 5.2 10*3/uL (ref 4.0–10.5)
nRBC: 0 % (ref 0.0–0.2)

## 2020-01-15 LAB — BRAIN NATRIURETIC PEPTIDE: B Natriuretic Peptide: 130.7 pg/mL — ABNORMAL HIGH (ref 0.0–100.0)

## 2020-01-15 LAB — BASIC METABOLIC PANEL
Anion gap: 8 (ref 5–15)
BUN: 16 mg/dL (ref 8–23)
CO2: 26 mmol/L (ref 22–32)
Calcium: 8.9 mg/dL (ref 8.9–10.3)
Chloride: 94 mmol/L — ABNORMAL LOW (ref 98–111)
Creatinine, Ser: 1.82 mg/dL — ABNORMAL HIGH (ref 0.61–1.24)
GFR calc Af Amer: 43 mL/min — ABNORMAL LOW (ref >60)
GFR calc non Af Amer: 37 mL/min — ABNORMAL LOW (ref >60)
Glucose, Bld: 86 mg/dL (ref 70–99)
Potassium: 4.6 mmol/L (ref 3.5–5.1)
Sodium: 128 mmol/L — ABNORMAL LOW (ref 135–145)

## 2020-01-15 NOTE — Progress Notes (Signed)
PCP: Colon Branch, MD Cardiology: Dr. Caryl Comes HF Cardiology: Dr. Aundra Dubin  69 y.o. with history of COPD, paroxysmal atrial fibrillation, type A aortic dissection, and chronic diastolic CHF was referred by Dr. Caryl Comes for evaluation of CHF.  Prior to 2018, patient reports no significant limitations.  In 8/18, he had a Type A aortic dissection.  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  The dissection extended down to the abdominal aorta.  He was a heavy smoker, quit in 8/18.  He developed paroxysmal atrial fibrillation, had DCCV in 10/19.  He is on Eliquis and flecainide now.  Last echo in 2/20 showed EF 55%, mildly decreased RV systolic function, normally functioning bioprosthetic aortic valve.  PFTs in 6/19 showed moderate-severe obstruction.    Ever since his operation in 2018, Sean Riley has had significant exertional dyspnea and has been quite limited.    He had RHC in 11/20, showing normal filling pressures, preserved cardiac output, and mild primarily pulmonary venous hypertension.    PYP scan was not suggestive of cardiac amyloidosis.   He saw pulmonary, PFTs showed moderate to severe obstruction.  He is using oxygen with exertion now, which he says helps. He is using CPAP.   Echo 12/13/19 w/ EF 55%, basal inferior akinesis, mild RV dilation with normal function, PASP 46 mmHg, s/p Bentall, s/p bioprosthetic aortic valve with mean gradient 16 mmHg.   Returns to clinic today for f/u. Only complaint is nasal congestion, rhinorrhea w/ post nasal drip. Cough w/ occasional sputum production (white/ clear). Denies fever and chills. No significant dyspnea. Thinks symptoms related to seasonal allergies. He notes improvement w/ Benadryl. Able to perform basic ADLs w/o significant dyspnea. Wt is actually down 6 lb from previous visit, from 208>>202 lb. ReDs clip 32%. No CP. Reports full med compliance. BP well controlled.   REDS clip 32%  Labs  (8/20): LDL 74 Labs (10/20): Na 126, K 4.5, creatinine 1.15 Labs (11/20): K 3.8, creatinine 1.3 Labs (12/20): K 3.9, creatinine 1.5, myeloma panel negative Labs (3/21): K 4.1, creatinine 1.6, TSH normal Labs (5/21): BNP 142, creatinine 2  PMH: 1. COPD: PFTs (6/19) with moderate-severe obstruction.  Prior heavy smoker, quit 2018.  - Uses oxygen with exertion.  2. Atrial fibrillation: Paroxysmal. DCCV in 10/19, now on flecainide.  3. HTN 4. Chronic diastolic CHF: Echo (0/07) with EF 55%, mild LVH, mild LV dilation, mild RV dilation with mildly decreased systolic function, bioprosthetic aortic valve with mean gradient 15 mmHg.  - RHC (11/20): mean RA 9, PA 41/15, mean PCWP 13, CI 2.81, PVR 2.15 WU.   - PYP scan not suggestive of cardiac amyloidosis.  - Echo (5/21): EF 55%, basal inferior akinesis, mild RV dilation with normal function, PASP 46 mmHg, s/p Bentall, s/p bioprosthetic aortic valve with mean gradient 16 mmHg.  5. Type A aortic dissection: 8/18, patient had emergency repair with Bentall/bioprosthetic aortic valve.  Dissection extends down to abdominal aorta.  Coronaries re-implanted. Complicated by pericardial effusion requiring pericardiocentesis.  6. Chronic hyponatremia.  7. OSA: He is using CPAP.   FH: Uncle with aortic dissection, mother with SCD at age 39.   SH: Lives [place] farm in [place].  Married, prior heavy smoker but quit in 2018.   ROS: All systems reviewed and negative except as per HPI.   Current Outpatient Medications  Medication Sig Dispense Refill  . albuterol (PROAIR HFA) 108 (90 Base) MCG/ACT inhaler Inhale 2 puffs into the lungs every 4 (  four) hours as needed for wheezing or shortness of breath. 54 g 3  . albuterol (PROVENTIL) (2.5 MG/3ML) 0.083% nebulizer solution Take 3 mLs (2.5 mg total) by nebulization every 8 (eight) hours as needed for wheezing or shortness of breath. 150 mL 1  . ALPRAZolam (XANAX) 1 MG tablet TAKE 1-1&1/2 TABLETS BY MOUTH AT BEDTIME  40 tablet 2  . augmented betamethasone dipropionate (DIPROLENE-AF) 0.05 % cream Apply topically 2 (two) times daily as needed. 50 g 1  . azelastine (ASTELIN) 0.1 % nasal spray Place 2 sprays into both nostrils 2 (two) times daily as needed for allergies.    . diphenhydrAMINE (BENADRYL) 25 MG tablet Take 25 mg by mouth every 8 (eight) hours as needed (allergies/runny nose).     Marland Kitchen ELIQUIS 5 MG TABS tablet TAKE 1 TABLET TWICE A DAY 180 tablet 1  . escitalopram (LEXAPRO) 10 MG tablet Take 1 tablet (10 mg total) by mouth daily. 90 tablet 3  . esomeprazole (NEXIUM) 40 MG capsule Take 1 capsule (40 mg total) by mouth daily before breakfast. 90 capsule 3  . flecainide (TAMBOCOR) 50 MG tablet Take 1 tablet (50 mg total) by mouth 2 (two) times daily. 270 tablet 3  . fluticasone (FLONASE) 50 MCG/ACT nasal spray Place 1 spray into both nostrils 2 (two) times daily as needed for allergies. Use one spray in each nostril twice daily.    . Fluticasone-Umeclidin-Vilant (TRELEGY ELLIPTA) 200-62.5-25 MCG/INH AEPB Inhale 1 puff into the lungs daily. 180 each 3  . losartan (COZAAR) 25 MG tablet Take 1 tablet (25 mg total) by mouth daily. 90 tablet 1  . potassium chloride SA (KLOR-CON) 20 MEQ tablet Take 2 tablets (40 mEq total) by mouth daily. 180 tablet 2  . rosuvastatin (CRESTOR) 5 MG tablet Take 1 tablet (5 mg total) by mouth daily. 90 tablet 3  . tamoxifen (NOLVADEX) 10 MG tablet Take 1 tablet (10 mg total) by mouth 2 (two) times daily. 180 tablet 0  . torsemide (DEMADEX) 20 MG tablet Take 60mg  (3 tabs) daily alternating with 40mg  (2 tabs) every other day 360 tablet 2  . metoprolol tartrate (LOPRESSOR) 25 MG tablet Take 1 tablet (25 mg total) by mouth 2 (two) times daily. 180 tablet 3   No current facility-administered medications for this encounter.   BP 102/64   Pulse (!) 56   Wt 91.6 kg (202 lb)   SpO2 (!) 88%   BMI 27.40 kg/m  PHYSICAL EXAM: General:  Well appearing. No respiratory difficulty HEENT:  normal Neck: supple. no JVD. Carotids 2+ bilat; no bruits. No lymphadenopathy or thyromegaly appreciated. Cor: PMI nondisplaced. Regular rate & rhythm. Crips valve sounds. 2/6 SM at RUSB  Lungs: clear Abdomen: soft, nontender, nondistended. No hepatosplenomegaly. No bruits or masses. Good bowel sounds. Extremities: no cyanosis, clubbing, rash, edema Neuro: alert & oriented x 3, cranial nerves grossly intact. moves all 4 extremities w/o difficulty. Affect pleasant.  Assessment/Plan: 1. Atrial fibrillation: Paroxysmal.  RRR on exam. HR controlled.  - Continue Eliquis 5 mg bid. Denies abnormal bleeding. Check CBC  - Continue flecainide + metoprolol.  2. Chronic diastolic CHF: Echo in 5/00 with EF 55%, mildly decreased RV systolic function, normally functioning bioprosthetic aortic valve. RHC in 11/20 after increasing torsemide showed near-normal filling pressures.  PYP study was not suggestive of ATTR amyloidosis and myeloma panel was negative.  Echo 5/21 EF 55%, mild RV dilation/normal function, bioprosthetic aortic valve mean gradient 16 mmHg. NYHA class II- III.  I suspect that  COPD also contributes to his exercise intolerance. Euvolemic by exam and by ReDs clip, 32%.  As creatinine is up to 2, I am not going to increase his diuretic.   - Continue torsemide 60 mg daily.  Repeat BMP today  - Would be good Cardiomems candidate except he has not been hospitalized in the last year.  3. COPD: Moderate to severe obstruction on 6/19 PFTs, prior smoker.  As above, suspect this contributes to his dyspnea.  - Continue followup with pulmonary.  4. OSA: Continue CPAP qhs.    5. Aortic dissection: S/p Type A aortic dissection, s/p Bentall with bioprosthetic aortic valve. Echo today showed stable bioprosthetic aortic valve.  Dr. Trula Slade has been following his residual dissected aorta.   - Given family history of aortic disease (uncle with aortic dissection, mother with SCD at age 53 - ?from dissection), It was  recommended that his children's aortas  be screened due to risk for inherited connective tissue disorder.  He is not Marfanoid.  His wife reports that both of their children have had imaging w/ no concerning findings.  6. H/o Hyponatremia: previously instructed to fluid restrict. Repeat BMP  7. PAD: Difficult to palpate pedal pulses. Notes symptoms c/w claudication. Former smoker and known vascular disease.    - order LE arterial dopplers w/ ABIs   F/u in 3 months or sooner if needed.   Lyda Jester, PA-C 01/15/2020

## 2020-01-15 NOTE — Telephone Encounter (Signed)
Pt aware via wife-repeat labs 6/16

## 2020-01-15 NOTE — Progress Notes (Signed)
ReDS Vest / Clip - 01/15/20 1200      ReDS Vest / Clip   Station Marker  D    Ruler Value  33    ReDS Value Range  Low volume    ReDS Actual Value  32    Anatomical Comments  sitting

## 2020-01-15 NOTE — Patient Instructions (Signed)
It was great to see you today! No medication changes are needed at this time.  Your physician has requested that you have an ankle brachial index (ABI). During this test an ultrasound and blood pressure cuff are used to evaluate the arteries that supply the arms and legs with blood. Allow thirty minutes for this exam. There are no restrictions or special instructions.  Your physician has requested that you have a lower extremity arterial exercise duplex. During this test, exercise and ultrasound are used to evaluate arterial blood flow in the legs. Allow one hour for this exam. There are no restrictions or special instructions.  Your physician recommends that you schedule a follow-up appointment in: 8-12 weeks   At the [facility], you and your health needs are our priority. As part of our continuing mission to provide you with exceptional heart care, we have created designated Provider Care Teams. These Care Teams include your primary Cardiologist (physician) and Advanced Practice Providers (APPs- Physician Assistants and Nurse Practitioners) who all work together to provide you with the care you need, when you need it.   You may see any of the following providers on your designated Care Team at your next follow up: Marland Kitchen Dr Glori Bickers . Dr [name] . Darrick Grinder, NP . Lyda Jester, PA . Audry Riles, PharmD   Please be sure to bring in all your medications bottles to every appointment.

## 2020-01-15 NOTE — Telephone Encounter (Signed)
-----   Message from Consuelo Pandy, [place] sent at 01/15/2020  5:28 PM EDT ----- Labs stable but Na level a bit low. Will need to reduce PO fluid intake, restrict to <1.5L a day. Repeat BMP in 1 week

## 2020-01-24 ENCOUNTER — Other Ambulatory Visit: Payer: Self-pay

## 2020-01-24 ENCOUNTER — Ambulatory Visit (HOSPITAL_COMMUNITY)
Admission: RE | Admit: 2020-01-24 | Discharge: 2020-01-24 | Disposition: A | Discharge status: Home / Self Care | Payer: Medicare Other | Source: Ambulatory Visit | Attending: Internal Medicine | Admitting: Internal Medicine

## 2020-01-24 DIAGNOSIS — I5032 Chronic diastolic (congestive) heart failure: Secondary | ICD-10-CM | POA: Diagnosis not present

## 2020-01-24 LAB — BASIC METABOLIC PANEL
Anion gap: 9 (ref 5–15)
BUN: 20 mg/dL (ref 8–23)
CO2: 25 mmol/L (ref 22–32)
Calcium: 8.7 mg/dL — ABNORMAL LOW (ref 8.9–10.3)
Chloride: 94 mmol/L — ABNORMAL LOW (ref 98–111)
Creatinine, Ser: 1.92 mg/dL — ABNORMAL HIGH (ref 0.61–1.24)
GFR calc Af Amer: 40 mL/min — ABNORMAL LOW (ref >60)
GFR calc non Af Amer: 35 mL/min — ABNORMAL LOW (ref >60)
Glucose, Bld: 97 mg/dL (ref 70–99)
Potassium: 4.2 mmol/L (ref 3.5–5.1)
Sodium: 128 mmol/L — ABNORMAL LOW (ref 135–145)

## 2020-01-29 ENCOUNTER — Ambulatory Visit
Admission: RE | Admit: 2020-01-29 | Discharge: 2020-01-29 | Disposition: A | Discharge status: Home / Self Care | Payer: Medicare Other | Source: Ambulatory Visit | Attending: Surgery | Admitting: Surgery

## 2020-01-29 DIAGNOSIS — I712 Thoracic aortic aneurysm, without rupture, unspecified: Secondary | ICD-10-CM

## 2020-01-29 MED ORDER — IOPAMIDOL (ISOVUE-370) INJECTION 76%
50.0000 mL | Freq: Once | INTRAVENOUS | Status: AC | PRN
  Administered 2020-01-29: 50 mL via INTRAVENOUS

## 2020-02-05 ENCOUNTER — Other Ambulatory Visit: Payer: Self-pay

## 2020-02-05 ENCOUNTER — Encounter: Payer: Self-pay | Admitting: Surgery

## 2020-02-05 ENCOUNTER — Ambulatory Visit (INDEPENDENT_AMBULATORY_CARE_PROVIDER_SITE_OTHER): Payer: Medicare Other | Admitting: Surgery

## 2020-02-05 VITALS — BP 136/84 | HR 89 | Temp 97.8°F | Resp 18 | Ht 72.0 in | Wt 197.0 lb

## 2020-02-05 DIAGNOSIS — I712 Thoracic aortic aneurysm, without rupture, unspecified: Secondary | ICD-10-CM

## 2020-02-05 NOTE — Progress Notes (Signed)
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 He underwent complete aortic arch replacement with elephant trunk as well as bioprosthetic tissue valve. He had progressive enlargement of the descending thoracic aorta with persistent dissection flap and underwent endovascular repair on 03/30/2018. His postoperative course was uncomplicated.   He is medically managed for hypertension with an ARB.  He takes a statin for hypercholesterolemia.  He is on Eliquis for atrial fibrillation.  He is a former smoker with COPD on intermittent O2 and CPAP.  He is followed by Dr. Marigene Ehlers for CHF.  PAST MEDICAL HISTORY:   Past Medical History:  Diagnosis Date  . Allergy   . Arthritis   . Ascending aortic dissection ([place]) 03/23/2017  . Atrial fibrillation ([place])   . Cataract    removed both eyes   . GERD (gastroesophageal reflux disease)   . HYPERLIPIDEMIA   . 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 Hypertension   . Microscopic hematuria    negative cystoscopy  . NONSPECIFIC ABNORMAL ELECTROCARDIOGRAM   . Pleural effusion on right   . Pleural effusion, bilateral   . S/P aortic dissection repair 03/24/2017   Biological Bentall aortic root replacement + resection and grafting of entire ascending aorta, transverse aortic arch and proximal descending thoracic aorta with elephant trunk distal anastomosis and debranching of aortic arch vessels  . 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 Varicose  veins      FAMILY HISTORY:   Family History  Problem Relation Age of Onset  . Hypertension Mother   . Hypertension Father   . Benign prostatic hyperplasia Father        S/P TURP  . Heart attack Maternal Grandmother        MI in 83s  . Breast cancer Maternal Grandmother   . Arrhythmia Brother         X 2  . Heart attack Maternal Aunt        MI in 30s  . Stroke Neg Hx   . Diabetes Neg Hx   . Colon cancer Neg Hx   . Colon polyps Neg Hx   . Stomach cancer Neg Hx   . Rectal cancer Neg Hx   . Esophageal cancer Neg Hx   . Prostate cancer Neg Hx   . Other Neg Hx        gynecomastia    SOCIAL HISTORY:   Social History   Tobacco Use  . Smoking status: Former Smoker    Packs/day: 2.00    Years: 45.00    Pack years: 90.00    Types: Cigarettes    Quit date: 03/22/2017    Years since quitting: 2.8  . Smokeless tobacco: Never Used  . Tobacco comment: 2 ppd , quit 03/2017  Substance Use Topics  . Alcohol use: Not Currently    Alcohol/week: 0.0 standard drinks    Comment: 5 cans of beer daily     ALLERGIES:   Allergies  Allergen Reactions  . Simvastatin Other (See Comments)  Mental status changes  . Celecoxib Other (See Comments)    GI UPSET AND INFLAMMATION  . Codeine Other (See Comments)    GI UPSET AND INFLAMMATION  . Nsaids Other (See Comments)    GI UPSET AND INFLAMMATION (can tolerate via IV)  . Tape Other (See Comments)    Medical tape and Band-Aids PULL OFF THE SKIN; please use Coban wrap  . Cephalexin Itching and Rash  . Latex Rash     CURRENT MEDICATIONS:   Current Outpatient Medications  Medication Sig Dispense Refill  . albuterol (PROAIR HFA) 108 (90 Base) MCG/ACT inhaler Inhale 2 puffs into the lungs every 4 (four) hours as needed for wheezing or shortness of breath. 54 g 3  . albuterol (PROVENTIL) (2.5 MG/3ML) 0.083% nebulizer solution Take 3 mLs (2.5 mg total) by nebulization every 8 (eight) hours as needed for wheezing or shortness of breath.  150 mL 1  . ALPRAZolam (XANAX) 1 MG tablet TAKE 1-1&1/2 TABLETS BY MOUTH AT BEDTIME 40 tablet 2  . augmented betamethasone dipropionate (DIPROLENE-AF) 0.05 % cream Apply topically 2 (two) times daily as needed. 50 g 1  . azelastine (ASTELIN) 0.1 % nasal spray Place 2 sprays into both nostrils 2 (two) times daily as needed for allergies.    . diphenhydrAMINE (BENADRYL) 25 MG tablet Take 25 mg by mouth every 8 (eight) hours as needed (allergies/runny nose).     Marland Kitchen ELIQUIS 5 MG TABS tablet TAKE 1 TABLET TWICE A DAY 180 tablet 1  . escitalopram (LEXAPRO) 10 MG tablet Take 1 tablet (10 mg total) by mouth daily. 90 tablet 3  . esomeprazole (NEXIUM) 40 MG capsule Take 1 capsule (40 mg total) by mouth daily before breakfast. 90 capsule 3  . flecainide (TAMBOCOR) 50 MG tablet Take 1 tablet (50 mg total) by mouth 2 (two) times daily. 270 tablet 3  . fluticasone (FLONASE) 50 MCG/ACT nasal spray Place 1 spray into both nostrils 2 (two) times daily as needed for allergies. Use one spray in each nostril twice daily.    . Fluticasone-Umeclidin-Vilant (TRELEGY ELLIPTA) 200-62.5-25 MCG/INH AEPB Inhale 1 puff into the lungs daily. 180 each 3  . losartan (COZAAR) 25 MG tablet Take 1 tablet (25 mg total) by mouth daily. 90 tablet 1  . potassium chloride SA (KLOR-CON) 20 MEQ tablet Take 2 tablets (40 mEq total) by mouth daily. 180 tablet 2  . rosuvastatin (CRESTOR) 5 MG tablet Take 1 tablet (5 mg total) by mouth daily. 90 tablet 3  . tamoxifen (NOLVADEX) 10 MG tablet Take 1 tablet (10 mg total) by mouth 2 (two) times daily. 180 tablet 0  . torsemide (DEMADEX) 20 MG tablet Take 60mg  (3 tabs) daily alternating with 40mg  (2 tabs) every other day 360 tablet 2  . metoprolol tartrate (LOPRESSOR) 25 MG tablet Take 1 tablet (25 mg total) by mouth 2 (two) times daily. 180 tablet 3   No current facility-administered medications for this visit.    REVIEW OF SYSTEMS:   [X]  denotes positive finding, [ ]  denotes negative  finding Cardiac  Comments:  Chest pain or chest pressure:    Shortness of breath upon exertion: x   Short of breath when lying flat:    Irregular heart rhythm:        Vascular    Pain in calf, thigh, or hip brought on by ambulation: x   Pain in feet at night that wakes you up from your sleep:     Blood clot in your veins:  Leg swelling:         Pulmonary    Oxygen at home: x   Productive cough:     Wheezing:         Neurologic    Sudden weakness in arms or legs:     Sudden numbness in arms or legs:     Sudden onset of difficulty speaking or slurred speech:    Temporary loss of vision in one eye:     Problems with dizziness:         Gastrointestinal    Blood in stool:     Vomited blood:         Genitourinary    Burning when urinating:     Blood in urine:        Psychiatric    Major depression:         Hematologic    Bleeding problems:    Problems with blood clotting too easily:        Skin    Rashes or ulcers:        Constitutional    Fever or chills:      PHYSICAL EXAM:   Vitals:   02/05/20 1110  BP: 136/84  Pulse: 89  Resp: 18  Temp: 97.8 F (36.6 C)  TempSrc: Temporal  SpO2: 92%  Weight: 197 lb (89.4 kg)  Height: 6' (1.829 m)    GENERAL: The patient is a well-nourished male, in no acute distress. The vital signs are documented above. CARDIAC: There is a regular rate and rhythm.  VASCULAR: Nonpalpable pedal pulses PULMONARY: Non-labored respirations ABDOMEN: Soft and non-tender with normal pitched bowel sounds.  MUSCULOSKELETAL: There are no major deformities or cyanosis. NEUROLOGIC: No focal weakness or paresthesias are detected. SKIN: There are no ulcers or rashes noted. PSYCHIATRIC: The patient has a normal affect.  STUDIES:   I have reviewed his CT angiogram with the following findings:  Unchanged appearance of hybrid repair for type A aortic dissection, including aortic valve replacement, aortic root and ascending graft placement  with coronary artery reimplantation, complete arch repair with de-branching, and frozen elephant trunk stent graft to the aortic hiatus. Overall there has been positive aortic remodeling compared to baseline CT studies, with continued retrograde perfusion of the false channel of the thoracic aorta.  Unchanged configuration of the abdominal aortic dissection flap, with no interval enlargement of the abdominal aorta.  Chronic changes of the lungs including small bilateral pleural effusions. MEDICAL ISSUES:   Aortic dissection: The most recent echo shows that he has normal valvular function.  His branch grafts remain widely patent.  The stent graft remains in good position without signs of migration.  The false lumen has decreased in size from 3 cm to 2.3 cm over the past year.  I will plan on repeating his CT scan in 1 year.  There is also a concern over a genetic component given his family history.  He tells me that his kids have been tested.  The patient is scheduled to get lower extremity Doppler studies next week for nonpalpable pedal pulses.  He does not have any active ulceration although there is evidence of a healed ulcer on the lateral left malleolus.  He has claudication symptoms but these are not lifestyle limiting.  Leia Alf, MD, FACS [facility] [phone] Pager [phone]

## 2020-02-08 ENCOUNTER — Ambulatory Visit (HOSPITAL_COMMUNITY)
Admission: RE | Admit: 2020-02-08 | Discharge: 2020-02-08 | Disposition: A | Discharge status: Home / Self Care | Payer: Medicare Other | Source: Ambulatory Visit | Attending: Cardiology | Admitting: Cardiology

## 2020-02-08 ENCOUNTER — Other Ambulatory Visit: Payer: Self-pay

## 2020-02-08 DIAGNOSIS — I739 Peripheral vascular disease, unspecified: Secondary | ICD-10-CM | POA: Diagnosis not present

## 2020-02-27 ENCOUNTER — Telehealth: Payer: Self-pay | Admitting: Internal Medicine

## 2020-02-27 NOTE — Telephone Encounter (Signed)
PDMP okay, prescription sent 

## 2020-02-27 NOTE — Telephone Encounter (Signed)
Last O.V.   12-25-2019 Last Refill-  07-19-2019 Next O.V- 04-30-2020

## 2020-03-15 ENCOUNTER — Encounter (HOSPITAL_COMMUNITY): Payer: Medicare Other | Admitting: Cardiology

## 2020-04-13 ENCOUNTER — Other Ambulatory Visit: Payer: Self-pay | Admitting: Internal Medicine

## 2020-04-16 ENCOUNTER — Other Ambulatory Visit: Payer: Self-pay | Admitting: Internal Medicine

## 2020-04-17 NOTE — Telephone Encounter (Signed)
Eliquis 5mg  refill request received. Patient is 68 years old, weight-89.4kg, Crea-1.92 on 01/24/2020, Diagnosis-Afib, and last seen by Dr. Aundra Dubin on 01/15/2020. Dose is appropriate based on dosing criteria. Will send in refill to requested pharmacy.

## 2020-04-18 ENCOUNTER — Encounter (HOSPITAL_COMMUNITY): Payer: Medicare Other | Admitting: Cardiology

## 2020-04-24 ENCOUNTER — Encounter (HOSPITAL_COMMUNITY): Payer: Medicare Other | Admitting: Cardiology

## 2020-04-30 ENCOUNTER — Telehealth: Payer: Self-pay

## 2020-04-30 ENCOUNTER — Other Ambulatory Visit: Payer: Self-pay

## 2020-04-30 ENCOUNTER — Ambulatory Visit (HOSPITAL_BASED_OUTPATIENT_CLINIC_OR_DEPARTMENT_OTHER)
Admission: RE | Admit: 2020-04-30 | Discharge: 2020-04-30 | Disposition: A | Discharge status: Home / Self Care | Payer: Medicare Other | Source: Ambulatory Visit | Attending: Internal Medicine | Admitting: Internal Medicine

## 2020-04-30 ENCOUNTER — Encounter: Payer: Self-pay | Admitting: Internal Medicine

## 2020-04-30 ENCOUNTER — Ambulatory Visit (INDEPENDENT_AMBULATORY_CARE_PROVIDER_SITE_OTHER): Payer: Medicare Other | Admitting: Internal Medicine

## 2020-04-30 VITALS — BP 148/84 | HR 89 | Temp 97.6°F | Resp 18 | Ht 72.0 in | Wt 197.0 lb

## 2020-04-30 DIAGNOSIS — I517 Cardiomegaly: Secondary | ICD-10-CM | POA: Diagnosis not present

## 2020-04-30 DIAGNOSIS — J90 Pleural effusion, not elsewhere classified: Secondary | ICD-10-CM | POA: Diagnosis not present

## 2020-04-30 DIAGNOSIS — I480 Paroxysmal atrial fibrillation: Secondary | ICD-10-CM | POA: Insufficient documentation

## 2020-04-30 DIAGNOSIS — I509 Heart failure, unspecified: Secondary | ICD-10-CM

## 2020-04-30 DIAGNOSIS — J449 Chronic obstructive pulmonary disease, unspecified: Secondary | ICD-10-CM

## 2020-04-30 DIAGNOSIS — I4891 Unspecified atrial fibrillation: Secondary | ICD-10-CM | POA: Diagnosis not present

## 2020-04-30 DIAGNOSIS — J441 Chronic obstructive pulmonary disease with (acute) exacerbation: Secondary | ICD-10-CM | POA: Diagnosis not present

## 2020-04-30 LAB — COMPREHENSIVE METABOLIC PANEL
AG Ratio: 1.3 (calc) (ref 1.0–2.5)
ALT: 6 U/L — ABNORMAL LOW (ref 9–46)
AST: 12 U/L (ref 10–35)
Albumin: 4.3 g/dL (ref 3.6–5.1)
Alkaline phosphatase (APISO): 85 U/L (ref 35–144)
BUN/Creatinine Ratio: 12 (calc) (ref 6–22)
BUN: 21 mg/dL (ref 7–25)
CO2: 30 mmol/L (ref 20–32)
Calcium: 9.5 mg/dL (ref 8.6–10.3)
Chloride: 94 mmol/L — ABNORMAL LOW (ref 98–110)
Creat: 1.8 mg/dL — ABNORMAL HIGH (ref 0.70–1.18)
Globulin: 3.2 g/dL (calc) (ref 1.9–3.7)
Glucose, Bld: 89 mg/dL (ref 65–99)
Potassium: 4.5 mmol/L (ref 3.5–5.3)
Sodium: 132 mmol/L — ABNORMAL LOW (ref 135–146)
Total Bilirubin: 1.1 mg/dL (ref 0.2–1.2)
Total Protein: 7.5 g/dL (ref 6.1–8.1)

## 2020-04-30 LAB — CBC WITH DIFFERENTIAL/PLATELET
Absolute Monocytes: 507 cells/uL (ref 200–950)
Basophils Absolute: 29 cells/uL (ref 0–200)
Basophils Relative: 0.5 %
Eosinophils Absolute: 148 cells/uL (ref 15–500)
Eosinophils Relative: 2.6 %
HCT: 38.7 % (ref 38.5–50.0)
Hemoglobin: 12.8 g/dL — ABNORMAL LOW (ref 13.2–17.1)
Lymphs Abs: 570 cells/uL — ABNORMAL LOW (ref 850–3900)
MCH: 31.3 pg (ref 27.0–33.0)
MCHC: 33.1 g/dL (ref 32.0–36.0)
MCV: 94.6 fL (ref 80.0–100.0)
MPV: 8.7 fL (ref 7.5–12.5)
Monocytes Relative: 8.9 %
Neutro Abs: 4446 cells/uL (ref 1500–7800)
Neutrophils Relative %: 78 %
Platelets: 212 10*3/uL (ref 140–400)
RBC: 4.09 10*6/uL — ABNORMAL LOW (ref 4.20–5.80)
RDW: 13.6 % (ref 11.0–15.0)
Total Lymphocyte: 10 %
WBC: 5.7 10*3/uL (ref 3.8–10.8)

## 2020-04-30 LAB — BRAIN NATRIURETIC PEPTIDE: Brain Natriuretic Peptide: 125 pg/mL — ABNORMAL HIGH (ref <100)

## 2020-04-30 MED ORDER — AZITHROMYCIN 250 MG PO TABS
ORAL_TABLET | ORAL | 0 refills | Status: DC
Start: 2020-04-30 — End: 2020-05-20

## 2020-04-30 MED ORDER — PREDNISONE 10 MG PO TABS
ORAL_TABLET | ORAL | 0 refills | Status: DC
Start: 2020-04-30 — End: 2020-05-20

## 2020-04-30 MED ORDER — ALBUTEROL SULFATE (2.5 MG/3ML) 0.083% IN NEBU: 2.5000 mg | INHALATION_SOLUTION | Freq: Three times a day (TID) | RESPIRATORY_TRACT | 1 refills | Status: DC | PRN

## 2020-04-30 NOTE — Progress Notes (Signed)
Subjective:    Patient ID: Sean Riley, male    DOB: 1950-12-30, 69 y.o.   MRN: [id number]  DOS:  04/30/2020 Type of visit - description: Follow-up He is here with his wife for a follow-up but both report that he is having sinus congestion and increased chest congestion for the last week. Cough slightly above baseline, whitish sputum noted.  No hemoptysis.  He denies fever chills No chest pain Difficulty breathing at baseline   Wt Readings from Last 3 Encounters:  04/30/20 197 lb (89.4 kg)  02/05/20 197 lb (89.4 kg)  01/15/20 202 lb (91.6 kg)     Review of Systems See above   Past Medical History:  Diagnosis Date  . Allergy   . Arthritis   . Ascending aortic dissection ([place]) 03/23/2017  . Atrial fibrillation ([place])   . Cataract    removed both eyes   . GERD (gastroesophageal reflux disease)   . HYPERLIPIDEMIA   . 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 Hypertension   . Microscopic hematuria    negative cystoscopy  . NONSPECIFIC ABNORMAL ELECTROCARDIOGRAM   . Pleural effusion on right   . Pleural effusion, bilateral   . S/P aortic dissection repair 03/24/2017   Biological Bentall aortic root replacement + resection and grafting of entire ascending aorta, transverse aortic arch and proximal descending thoracic aorta with elephant trunk distal anastomosis and debranching of aortic arch vessels  . 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 Varicose veins     Past Surgical History:  Procedure Laterality Date  . APPENDECTOMY    . CARDIAC CATHETERIZATION      X3;Dr Caryl Comes, last 01/20/12: mild non-obstructive CAD, normal LV systolic function  . CARDIOVERSION N/A 05/31/2018   Procedure: CARDIOVERSION;  Surgeon: Sueanne Margarita, MD;  Location: [facility] ENDOSCOPY;  Service:  Cardiovascular;  Laterality: N/A;  . COLONOSCOPY    . CYSTOSCOPY  1978   Dr Hartley Barefoot  . EYE SURGERY    . IR THORACENTESIS ASP PLEURAL SPACE W/IMG GUIDE  11/25/2017  . KNEE ARTHROSCOPY  2012   Dr Theda Sers  . LUMBAR LAMINECTOMY  1987   Dr Durward Fortes  . MYELOGRAM  2007  . PERICARDIOCENTESIS N/A 04/07/2017   Procedure: PERICARDIOCENTESIS;  Surgeon: Sherren Mocha, MD;  Location: [place] CV LAB;  Service: Cardiovascular;  Laterality: N/A;  . POLYPECTOMY    . REPAIR OF ACUTE ASCENDING THORACIC AORTIC DISSECTION N/A 03/23/2017   Procedure: REPAIR OF ACUTE ASCENDING THORACIC AORTIC DISSECTION.  Bentall procedure.  Aortic root repleacement with bioprosthetic valve.  Reimplantation of left and right coronary arteries.  Total resection of transverse aortic arch.  Elephant trunk distal anastomosis and debranching of arch vessels.;  Surgeon: Rexene Alberts, MD;  Location: [place];  Service: Vascular;  Laterality: N/A;  . RIGHT HEART CATH N/A 06/15/2019   Procedure: RIGHT HEART CATH;  Surgeon: Larey Dresser, MD;  Location: [place] CV LAB;  Service: Cardiovascular;  Laterality: N/A;  . ROTATOR CUFF REPAIR    . SHOULDER ARTHROSCOPY  2011   Dr Theda Sers  . SHOULDER ARTHROSCOPY Right 08/2015  . SPINAL FUSION  1986   Dr Rolin Barry  . TEE WITHOUT CARDIOVERSION N/A 03/23/2017   Procedure: TRANSESOPHAGEAL ECHOCARDIOGRAM (TEE);  Surgeon: Rexene Alberts, MD;  Location: [facility]  OR;  Service: Open Heart Surgery;  Laterality: N/A;  . THORACIC AORTIC ENDOVASCULAR STENT GRAFT N/A 03/30/2018   Procedure: THORACIC AORTIC ENDOVASCULAR STENT GRAFT WITH INTRAVASCULAR ULTRASOUND;  Surgeon: Serafina Mitchell, MD;  Location: [place];  Service: Vascular;  Laterality: N/A;  . TRACHEOSTOMY     age 39 for croup    Allergies as of 04/30/2020      Reactions   Simvastatin Other (See Comments)   Mental status changes   Celecoxib Other (See Comments)   GI UPSET AND INFLAMMATION   Codeine Other (See Comments)   GI UPSET AND  INFLAMMATION   Nsaids Other (See Comments)   GI UPSET AND INFLAMMATION (can tolerate via IV)   Tape Other (See Comments)   Medical tape and Band-Aids PULL OFF THE SKIN; please use Coban wrap   Cephalexin Itching, Rash   Latex Rash      Medication List       Accurate as of April 30, 2020  8:58 PM. If you have any questions, ask your nurse or doctor.        albuterol 108 (90 Base) MCG/ACT inhaler Commonly known as: ProAir HFA Inhale 2 puffs into the lungs every 4 (four) hours as needed for wheezing or shortness of breath.   albuterol (2.5 MG/3ML) 0.083% nebulizer solution Commonly known as: PROVENTIL Take 3 mLs (2.5 mg total) by nebulization every 8 (eight) hours as needed for wheezing or shortness of breath.   ALPRAZolam 1 MG tablet Commonly known as: XANAX TAKE 1-1&1/2 TABLETS BY MOUTH AT BEDTIME   augmented betamethasone dipropionate 0.05 % cream Commonly known as: DIPROLENE-AF Apply topically 2 (two) times daily as needed.   azelastine 0.1 % nasal spray Commonly known as: ASTELIN Place 2 sprays into both nostrils 2 (two) times daily as needed for allergies.   azithromycin 250 MG tablet Commonly known as: Zithromax Z-Pak 2 tabs a day the first day, then 1 tab a day x 4 days Started by: Kathlene November, MD   diphenhydrAMINE 25 MG tablet Commonly known as: BENADRYL Take 25 mg by mouth every 8 (eight) hours as needed (allergies/runny nose).   Eliquis 5 MG Tabs tablet Generic drug: apixaban TAKE 1 TABLET TWICE A DAY   escitalopram 10 MG tablet Commonly known as: LEXAPRO Take 1 tablet (10 mg total) by mouth daily.   esomeprazole 40 MG capsule Commonly known as: NexIUM Take 1 capsule (40 mg total) by mouth daily before breakfast.   flecainide 50 MG tablet Commonly known as: TAMBOCOR Take 1 tablet (50 mg total) by mouth 2 (two) times daily.   fluticasone 50 MCG/ACT nasal spray Commonly known as: FLONASE Place 1 spray into both nostrils 2 (two) times daily as  needed for allergies. Use one spray in each nostril twice daily.   losartan 25 MG tablet Commonly known as: COZAAR Take 1 tablet (25 mg total) by mouth daily.   metoprolol tartrate 25 MG tablet Commonly known as: LOPRESSOR Take 1 tablet (25 mg total) by mouth 2 (two) times daily.   potassium chloride SA 20 MEQ tablet Commonly known as: KLOR-CON Take 2 tablets (40 mEq total) by mouth daily.   predniSONE 10 MG tablet Commonly known as: DELTASONE 4 tablets x 2 days, 3 tabs x 2 days, 2 tabs x 2 days, 1 tab x 2 days Started by: Kathlene November, MD   rosuvastatin 5 MG tablet Commonly known as: CRESTOR Take 1 tablet (5 mg total) by mouth daily.   tamoxifen 10 MG  tablet Commonly known as: NOLVADEX Take 1 tablet (10 mg total) by mouth 2 (two) times daily.   torsemide 20 MG tablet Commonly known as: DEMADEX Take 60mg  (3 tabs) daily alternating with 40mg  (2 tabs) every other day   Trelegy Ellipta 200-62.5-25 MCG/INH Aepb Generic drug: Fluticasone-Umeclidin-Vilant Inhale 1 puff into the lungs daily.          Objective:   Physical Exam BP (!) 148/84 (BP Location: Left Arm, Patient Position: Sitting, Cuff Size: Normal)   Pulse 89   Temp 97.6 F (36.4 C) (Oral)   Resp 18   Ht 6' (1.829 m)   Wt 197 lb (89.4 kg)   SpO2 (!) 88%   BMI 26.72 kg/m  General:   Well developed, NAD, BMI noted. HEENT:  Normocephalic . Face symmetric, atraumatic Neck: No JVD Lungs:  Decreased breath sounds bilaterally, few crackles at bases. Marland Kitchen Heart: Irregularly irregular Lower extremities: No pretibial edema, trace periankle edema Skin: Not pale. Not jaundice Neurologic:  alert & oriented X3.  Speech normal, needs assistant for gait and transfer Psych--  Cognition and judgment appear intact.  Cooperative with normal attention span and concentration.  Behavior appropriate. No anxious or depressed appearing.      Assessment     Assessment  HTN Hyperlipidemia GERD Insomnia: xanax prn MSK:  See surgeries Pulmonary  --COPD, s/p  PFTs 12/2013; 2020: Moderate to severe - Home O2  --Smoker, quit 03/2017 (2PPD) -- OSA , on Cpap Allergies-- Dr Neldon Mc  CV: --P- Atrial fibrillation Dr Caryl Comes, on NSRb --Cardiac catheterization July 2013: Normal LV FX,mild nonobstructive CAD Dr. 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 Loanne Drilling  >> Bx (-) 10-2014 ; took tamoxifen temporarily (self d/c 06-2016) ---Again 09/2019, labs/MMG done, on [place] agai Dermatology : eczema? rx taclonex H/o  Tracheostomy , age 67, croup Chronic anemia: Chronic anemia per chart review,+ iron deficient (08-2018). S/p  colonoscopy 05-2016, multiple polyps. EGD 2014 for chest pain: Negative, bx results?  PLAN: COPD exacerbation The patient presents with increased respiratory symptoms, on exam O2 sat upon arrival was 88, after prolonged rest it increased to 94% (RA).  Low O2 sats are not uncommon readings for him, he has oxygen at home but uses it infrequently. Heart rate is irregular, EKG today: Atrial fibrillation, (he is usually on sinus rhythm) Plan: Advised patient to go to the ER.  Suspect pneumonia bacterial or viral, vs CHF vs others. I spoke with the ER doctor as well, she was more than willing to see the patient acknowledging that there is a 15-hour wait time due to the Covid pandemia. Pros and cons to going to the ER discussed with the patient and his wife and at the end we agreed to do the following: -Stat CMP, CBC, BNP and chest x-ray.  Covid testing. -Depending on results, he may need to go to the ER anyway. -Otherwise rest at home, home O2 24/7 for now, immediate ER if any deterioration. Nebs 4 times daily until better Further advised with  results. Addendum: Chest x-ray with chronic bilateral pleural effusions, stable. BNP is slightly elevated, other labs are stable. No evidence of CHF, recommend Zithromax, prednisone, prescription sent. Above discussed with the patient. RTC as scheduled for 05/20/2020  Time spent with the patient today: 45 minutes, assessing  his symptoms, coordinating his care, explaining pro cons of ER visit versus outpatient therapy, discussing case with the ER physician. Resulting in labs and x-rays.  This visit occurred during the SARS-CoV-2 public health emergency.  Safety protocols were in place, including screening questions prior to the visit, additional usage of staff PPE, and extensive cleaning of exam room while observing appropriate contact time as indicated for disinfecting solutions.

## 2020-04-30 NOTE — Assessment & Plan Note (Signed)
COPD exacerbation The patient presents with increased respiratory symptoms, on exam O2 sat upon arrival was 88, after prolonged rest it increased to 94% (RA).  Low O2 sats are not uncommon readings for him, he has oxygen at home but uses it infrequently. Heart rate is irregular, EKG today: Atrial fibrillation, (he is usually on sinus rhythm) Plan: Advised patient to go to the ER.  Suspect pneumonia bacterial or viral, vs CHF vs others. I spoke with the ER doctor as well, she was more than willing to see the patient acknowledging that there is a 15-hour wait time due to the Covid pandemia. Pros and cons to going to the ER discussed with the patient and his wife and at the end we agreed to do the following: -Stat CMP, CBC, BNP and chest x-ray.  Covid testing. -Depending on results, he may need to go to the ER anyway. -Otherwise rest at home, home O2 24/7 for now, immediate ER if any deterioration. Nebs 4 times daily until better Further advised with results. Addendum: Chest x-ray with chronic bilateral pleural effusions, stable. BNP is slightly elevated, other labs are stable. No evidence of CHF, recommend Zithromax, prednisone, prescription sent. Above discussed with the patient. RTC as scheduled for 05/20/2020

## 2020-04-30 NOTE — Progress Notes (Signed)
Pre visit review using our clinic review tool, if applicable. No additional management support is needed unless otherwise documented below in the visit note. 

## 2020-04-30 NOTE — Telephone Encounter (Signed)
Spoke w/ Pt's wife, [name], informed of results and recommendations. [name] verbalized understanding, she also requesting a refill on albuterol nebulizer solution- informed I'd send to [facility]. Appt scheduled 05/20/2020.

## 2020-04-30 NOTE — Telephone Encounter (Signed)
-----   Message from Colon Branch, MD sent at 04/30/2020  4:34 PM EDT ----- Regarding: Call the patient Please let them know that x-rays and labs are okay No evidence of CHF, recommend Zithromax, prednisone, I sent the prescriptions.Schedule a follow-up in 10 days after my vacation

## 2020-04-30 NOTE — Patient Instructions (Signed)
Rest  Robitussin-DM or Mucinex DM  Use your nebulizer 4 times a day regardless of cough  Use your oxygen 24/7 for the next few days  Go to the ER immediately if you feel any worse   GO TO THE LAB : Get the blood work      STOP BY THE FIRST FLOOR:  get the XR

## 2020-05-02 LAB — NOVEL CORONAVIRUS, NAA: SARS-CoV-2, NAA: NOT DETECTED

## 2020-05-02 LAB — SARS-COV-2, NAA 2 DAY TAT

## 2020-05-03 ENCOUNTER — Ambulatory Visit: Payer: Medicare Other | Admitting: Pulmonary Disease

## 2020-05-16 ENCOUNTER — Ambulatory Visit: Payer: Medicare Other | Admitting: Pulmonary Disease

## 2020-05-16 ENCOUNTER — Other Ambulatory Visit: Payer: Self-pay | Admitting: Physician Assistant

## 2020-05-16 ENCOUNTER — Other Ambulatory Visit (HOSPITAL_COMMUNITY): Payer: Self-pay | Admitting: Cardiology

## 2020-05-20 ENCOUNTER — Other Ambulatory Visit: Payer: Self-pay

## 2020-05-20 ENCOUNTER — Encounter: Payer: Self-pay | Admitting: Internal Medicine

## 2020-05-20 ENCOUNTER — Ambulatory Visit (INDEPENDENT_AMBULATORY_CARE_PROVIDER_SITE_OTHER): Payer: Medicare Other | Admitting: Internal Medicine

## 2020-05-20 VITALS — BP 130/89 | HR 97 | Temp 97.6°F | Resp 18 | Ht 72.0 in | Wt 199.1 lb

## 2020-05-20 DIAGNOSIS — Z23 Encounter for immunization: Secondary | ICD-10-CM | POA: Diagnosis not present

## 2020-05-20 DIAGNOSIS — J449 Chronic obstructive pulmonary disease, unspecified: Secondary | ICD-10-CM | POA: Diagnosis not present

## 2020-05-20 DIAGNOSIS — I10 Essential (primary) hypertension: Secondary | ICD-10-CM

## 2020-05-20 NOTE — Patient Instructions (Addendum)
For the next 4 days, take   torsemide 4 tablets a day After that go back to your normal routine   Check the  blood pressure weekly BP GOAL is between 110/65 and  135/85. If it is consistently higher or lower, let me know   GO TO THE FRONT DESK, PLEASE SCHEDULE YOUR APPOINTMENTS Come back for a checkup in 3 to 4 months

## 2020-05-20 NOTE — Progress Notes (Signed)
Pre visit review using our clinic review tool, if applicable. No additional management support is needed unless otherwise documented below in the visit note. 

## 2020-05-20 NOTE — Progress Notes (Signed)
Subjective:    Patient ID: Sean Riley, male    DOB: 1951-03-12, 69 y.o.   MRN: [id number]  DOS:  05/20/2020 Type of visit - description: Follow-up Was seen acutely with COPD exacerbation the last time, since then took a Z-Pak, prednisone and is back to his baseline. Denies fever chills Denies chest pain Still coughing from time to time, mild  sputum production. He noted lower extremity edema shortly after he completed prednisone but that is now getting better.   Wt Readings from Last 3 Encounters:  05/20/20 199 lb 2 oz (90.3 kg)  04/30/20 197 lb (89.4 kg)  02/05/20 197 lb (89.4 kg)     Review of Systems See above   Past Medical History:  Diagnosis Date  . Allergy   . Arthritis   . Ascending aortic dissection ([place]) 03/23/2017  . Atrial fibrillation ([place])   . Cataract    removed both eyes   . GERD (gastroesophageal reflux disease)   . HYPERLIPIDEMIA   . 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 Hypertension   . Microscopic hematuria    negative cystoscopy  . NONSPECIFIC ABNORMAL ELECTROCARDIOGRAM   . Pleural effusion on right   . Pleural effusion, bilateral   . S/P aortic dissection repair 03/24/2017   Biological Bentall aortic root replacement + resection and grafting of entire ascending aorta, transverse aortic arch and proximal descending thoracic aorta with elephant trunk distal anastomosis and debranching of aortic arch vessels  . 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 Varicose veins     Past Surgical History:  Procedure Laterality Date  . APPENDECTOMY    . CARDIAC CATHETERIZATION      X3;Dr Caryl Comes, last 01/20/12: mild non-obstructive CAD, normal LV systolic function  . CARDIOVERSION N/A 05/31/2018   Procedure: CARDIOVERSION;  Surgeon: Sueanne Margarita, MD;  Location: [facility] ENDOSCOPY;  Service: Cardiovascular;  Laterality: N/A;  . COLONOSCOPY    . CYSTOSCOPY  1978   Dr Hartley Barefoot  . EYE SURGERY    . IR THORACENTESIS ASP PLEURAL SPACE W/IMG GUIDE  11/25/2017  . KNEE ARTHROSCOPY  2012   Dr Theda Sers  . LUMBAR LAMINECTOMY  1987   Dr Durward Fortes  . MYELOGRAM  2007  . PERICARDIOCENTESIS N/A 04/07/2017   Procedure: PERICARDIOCENTESIS;  Surgeon: Sherren Mocha, MD;  Location: [place] CV LAB;  Service: Cardiovascular;  Laterality: N/A;  . POLYPECTOMY    . REPAIR OF ACUTE ASCENDING THORACIC AORTIC DISSECTION N/A 03/23/2017   Procedure: REPAIR OF ACUTE ASCENDING THORACIC AORTIC DISSECTION.  Bentall procedure.  Aortic root repleacement with bioprosthetic valve.  Reimplantation of left and right coronary arteries.  Total resection of transverse aortic arch.  Elephant trunk distal anastomosis and debranching of arch vessels.;  Surgeon: Rexene Alberts, MD;  Location: [place];  Service: Vascular;  Laterality: N/A;  . RIGHT HEART CATH N/A 06/15/2019   Procedure: RIGHT HEART CATH;  Surgeon: Larey Dresser, MD;  Location: [place] CV LAB;  Service: Cardiovascular;  Laterality: N/A;  . ROTATOR CUFF REPAIR    . SHOULDER ARTHROSCOPY  2011   Dr Theda Sers  . SHOULDER ARTHROSCOPY Right 08/2015  . SPINAL FUSION  1986   Dr Rolin Barry  . TEE WITHOUT CARDIOVERSION N/A 03/23/2017   Procedure: TRANSESOPHAGEAL ECHOCARDIOGRAM (TEE);  Surgeon:  Rexene Alberts, MD;  Location: [place];  Service: Open Heart Surgery;  Laterality: N/A;  . THORACIC AORTIC ENDOVASCULAR STENT GRAFT N/A 03/30/2018   Procedure: THORACIC AORTIC ENDOVASCULAR STENT GRAFT WITH INTRAVASCULAR ULTRASOUND;  Surgeon: Serafina Mitchell, MD;  Location: [place];  Service: Vascular;  Laterality: N/A;  . TRACHEOSTOMY     age 67 for croup    Allergies as of 05/20/2020      Reactions   Simvastatin Other (See Comments)   Mental status changes   Celecoxib Other (See Comments)   GI UPSET AND INFLAMMATION   Codeine  Other (See Comments)   GI UPSET AND INFLAMMATION   Nsaids Other (See Comments)   GI UPSET AND INFLAMMATION (can tolerate via IV)   Tape Other (See Comments)   Medical tape and Band-Aids PULL OFF THE SKIN; please use Coban wrap   Cephalexin Itching, Rash   Latex Rash      Medication List       Accurate as of May 20, 2020 11:59 PM. If you have any questions, ask your nurse or doctor.        STOP taking these medications   azithromycin 250 MG tablet Commonly known as: Zithromax Z-Pak Stopped by: Kathlene November, MD   predniSONE 10 MG tablet Commonly known as: DELTASONE Stopped by: Kathlene November, MD     TAKE these medications   albuterol 108 (90 Base) MCG/ACT inhaler Commonly known as: ProAir HFA Inhale 2 puffs into the lungs every 4 (four) hours as needed for wheezing or shortness of breath.   albuterol (2.5 MG/3ML) 0.083% nebulizer solution Commonly known as: PROVENTIL Take 3 mLs (2.5 mg total) by nebulization every 8 (eight) hours as needed for wheezing or shortness of breath.   ALPRAZolam 1 MG tablet Commonly known as: XANAX TAKE 1-1&1/2 TABLETS BY MOUTH AT BEDTIME   augmented betamethasone dipropionate 0.05 % cream Commonly known as: DIPROLENE-AF Apply topically 2 (two) times daily as needed.   azelastine 0.1 % nasal spray Commonly known as: ASTELIN Place 2 sprays into both nostrils 2 (two) times daily as needed for allergies.   diphenhydrAMINE 25 MG tablet Commonly known as: BENADRYL Take 25 mg by mouth every 8 (eight) hours as needed (allergies/runny nose).   Eliquis 5 MG Tabs tablet Generic drug: apixaban TAKE 1 TABLET TWICE A DAY   escitalopram 10 MG tablet Commonly known as: LEXAPRO Take 1 tablet (10 mg total) by mouth daily.   esomeprazole 40 MG capsule Commonly known as: NexIUM Take 1 capsule (40 mg total) by mouth daily before breakfast.   flecainide 50 MG tablet Commonly known as: TAMBOCOR TAKE ONE AND ONE-HALF TABLETS TWICE A DAY   fluticasone 50  MCG/ACT nasal spray Commonly known as: FLONASE Place 1 spray into both nostrils 2 (two) times daily as needed for allergies. Use one spray in each nostril twice daily.   Klor-Con M20 20 MEQ tablet Generic drug: potassium chloride SA TAKE 2 TABLETS (40 MEQ) DAILY   losartan 25 MG tablet Commonly known as: COZAAR Take 1 tablet (25 mg total) by mouth daily.   metoprolol tartrate 25 MG tablet Commonly known as: LOPRESSOR Take 1 tablet (25 mg total) by mouth 2 (two) times daily.   rosuvastatin 5 MG tablet Commonly known as: CRESTOR Take 1 tablet (5 mg total) by mouth daily.   tamoxifen 10 MG tablet Commonly known as: NOLVADEX Take 1 tablet (10 mg total) by mouth 2 (two) times daily.   torsemide 20 MG tablet  Commonly known as: DEMADEX Take 3 tablets (60 mg total) by mouth daily.   Trelegy Ellipta 200-62.5-25 MCG/INH Aepb Generic drug: Fluticasone-Umeclidin-Vilant Inhale 1 puff into the lungs daily.          Objective:   Physical Exam BP 130/89 (BP Location: Left Arm, Patient Position: Sitting, Cuff Size: Small)   Pulse 97   Temp 97.6 F (36.4 C) (Oral)   Resp 18   Ht 6' (1.829 m)   Wt 199 lb 2 oz (90.3 kg)   SpO2 90%   BMI 27.01 kg/m   General:   Well developed, NAD, BMI noted. HEENT:  Normocephalic . Face symmetric, atraumatic Lungs:  Decreased breath sounds, some large airway congestion with cough, again I notice bilateral low dry crackles. No distress. Heart: Systolic murmur noted.  Seems regular rate. Lower extremities: + Periankle edema Skin: Not pale. Not jaundice Neurologic:  alert & oriented X3.  Speech normal, gait assisted by a cane Psych--  Cognition and judgment appear intact.  Cooperative with normal attention span and concentration.  Behavior appropriate. No anxious or depressed appearing.      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 Loanne Drilling  >> Bx (-) 10-2014 ; took tamoxifen temporarily (self d/c 06-2016) ---Again 09/2019, labs/MMG done, on [place] agai Dermatology : eczema? rx taclonex H/o  Tracheostomy , age 38, croup Chronic anemia: Chronic anemia per chart review,+ iron deficient (08-2018). S/p  colonoscopy 05-2016, multiple polyps. EGD 2014 for chest pain: Negative, bx results?  PLAN: COPD exacerbation: See last visit, status post a Z-Pak and prednisone, he seems to be back to baseline with his usual cough and whitish sputum. After treatment he developed lower extremity edema, it has improved, still have some around the ankles, has gained 2 pounds. Fluid retention possibly from prednisone.  Increase torsemide to 4 tablets daily x 3-4 days, then go back to his usual dose  (alternates 3 tablets and 2 tablets daily). Continue trilogy Ellipta, nebulizers, inhalers, oxygen supplements. Dry crackles at bases: Suspect related to COPD rather than fluid overload. RTC 3 to 4 months.   This visit occurred during the SARS-CoV-2 public health emergency.  Safety protocols were in place, including screening questions prior to the visit, additional usage of staff PPE, and extensive cleaning of exam room while observing appropriate contact time as indicated for disinfecting solutions.

## 2020-05-21 NOTE — Assessment & Plan Note (Signed)
COPD exacerbation: See last visit, status post a Z-Pak and prednisone, he seems to be back to baseline with his usual cough and whitish sputum. After treatment he developed lower extremity edema, it has improved, still have some around the ankles, has gained 2 pounds. Fluid retention possibly from prednisone.  Increase torsemide to 4 tablets daily x 3-4 days, then go back to his usual dose  (alternates 3 tablets and 2 tablets daily). Continue trilogy Ellipta, nebulizers, inhalers, oxygen supplements. Dry crackles at bases: Suspect related to COPD rather than fluid overload. RTC 3 to 4 months.

## 2020-06-04 ENCOUNTER — Encounter (HOSPITAL_COMMUNITY): Payer: Self-pay | Admitting: Cardiology

## 2020-06-04 ENCOUNTER — Ambulatory Visit (HOSPITAL_COMMUNITY)
Admission: RE | Admit: 2020-06-04 | Discharge: 2020-06-04 | Disposition: A | Discharge status: Home / Self Care | Payer: Medicare Other | Source: Ambulatory Visit | Attending: Cardiology | Admitting: Cardiology

## 2020-06-04 ENCOUNTER — Other Ambulatory Visit (HOSPITAL_COMMUNITY): Payer: Self-pay | Admitting: *Deleted

## 2020-06-04 ENCOUNTER — Other Ambulatory Visit: Payer: Self-pay

## 2020-06-04 VITALS — BP 110/70 | HR 95 | Wt 199.0 lb

## 2020-06-04 DIAGNOSIS — Z79899 Other long term (current) drug therapy: Secondary | ICD-10-CM | POA: Diagnosis not present

## 2020-06-04 DIAGNOSIS — Z953 Presence of xenogenic heart valve: Secondary | ICD-10-CM | POA: Insufficient documentation

## 2020-06-04 DIAGNOSIS — Z87891 Personal history of nicotine dependence: Secondary | ICD-10-CM | POA: Insufficient documentation

## 2020-06-04 DIAGNOSIS — G4733 Obstructive sleep apnea (adult) (pediatric): Secondary | ICD-10-CM | POA: Insufficient documentation

## 2020-06-04 DIAGNOSIS — Z7901 Long term (current) use of anticoagulants: Secondary | ICD-10-CM | POA: Insufficient documentation

## 2020-06-04 DIAGNOSIS — I4819 Other persistent atrial fibrillation: Secondary | ICD-10-CM | POA: Insufficient documentation

## 2020-06-04 DIAGNOSIS — I4891 Unspecified atrial fibrillation: Secondary | ICD-10-CM

## 2020-06-04 DIAGNOSIS — I5032 Chronic diastolic (congestive) heart failure: Secondary | ICD-10-CM | POA: Diagnosis not present

## 2020-06-04 DIAGNOSIS — I110 Hypertensive heart disease with heart failure: Secondary | ICD-10-CM | POA: Diagnosis not present

## 2020-06-04 DIAGNOSIS — E871 Hypo-osmolality and hyponatremia: Secondary | ICD-10-CM | POA: Diagnosis not present

## 2020-06-04 DIAGNOSIS — J449 Chronic obstructive pulmonary disease, unspecified: Secondary | ICD-10-CM | POA: Insufficient documentation

## 2020-06-04 LAB — BASIC METABOLIC PANEL
Anion gap: 9 (ref 5–15)
BUN: 15 mg/dL (ref 8–23)
CO2: 27 mmol/L (ref 22–32)
Calcium: 9.2 mg/dL (ref 8.9–10.3)
Chloride: 94 mmol/L — ABNORMAL LOW (ref 98–111)
Creatinine, Ser: 1.55 mg/dL — ABNORMAL HIGH (ref 0.61–1.24)
GFR, Estimated: 48 mL/min — ABNORMAL LOW (ref >60)
Glucose, Bld: 96 mg/dL (ref 70–99)
Potassium: 4 mmol/L (ref 3.5–5.1)
Sodium: 130 mmol/L — ABNORMAL LOW (ref 135–145)

## 2020-06-04 LAB — CBC
HCT: 40.7 % (ref 39.0–52.0)
Hemoglobin: 13.2 g/dL (ref 13.0–17.0)
MCH: 30.7 pg (ref 26.0–34.0)
MCHC: 32.4 g/dL (ref 30.0–36.0)
MCV: 94.7 fL (ref 80.0–100.0)
Platelets: 195 10*3/uL (ref 150–400)
RBC: 4.3 MIL/uL (ref 4.22–5.81)
RDW: 15.5 % (ref 11.5–15.5)
WBC: 6.5 10*3/uL (ref 4.0–10.5)
nRBC: 0 % (ref 0.0–0.2)

## 2020-06-04 LAB — BRAIN NATRIURETIC PEPTIDE: B Natriuretic Peptide: 118.1 pg/mL — ABNORMAL HIGH (ref 0.0–100.0)

## 2020-06-04 MED ORDER — TORSEMIDE 20 MG PO TABS: ORAL_TABLET | ORAL | 3 refills | Status: DC

## 2020-06-04 NOTE — H&P (View-Only) (Signed)
PCP: Colon Branch, MD Cardiology: Dr. Caryl Comes HF Cardiology: Dr. Aundra Dubin  69 y.o. with history of COPD, paroxysmal atrial fibrillation, type A aortic dissection, and chronic diastolic CHF was referred by Dr. Caryl Comes for evaluation of CHF.  Prior to 2018, patient reports no significant limitations.  In 8/18, he had a Type A aortic dissection.  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  The dissection extended down to the abdominal aorta.  He was a heavy smoker, quit in 8/18.  He developed paroxysmal atrial fibrillation, had DCCV in 10/19.  He is on Eliquis and flecainide now.  Last echo in 2/20 showed EF 55%, mildly decreased RV systolic function, normally functioning bioprosthetic aortic valve.  PFTs in 6/19 showed moderate-severe obstruction.    Ever since his operation in 2018, Sean Riley has had significant exertional dyspnea and has been quite limited.    He had RHC in 11/20, showing normal filling pressures, preserved cardiac output, and mild primarily pulmonary venous hypertension.    PYP scan was not suggestive of cardiac amyloidosis.   He saw pulmonary, PFTs showed moderate to severe obstruction.  He is using oxygen with exertion now, which he says helps. He is using CPAP.   Echo in 5/21 showed EF 55%, basal inferior akinesis, mild RV dilation with normal function, PASP 46 mmHg, s/p Bentall, s/p bioprosthetic aortic valve with mean gradient 16 mmHg.   He returns for followup of CHF.  Weight is down 3 lbs. Not wearing oxygen as much as he needs to, does not have a convenient portable oxygen tank.  He is chronically short of breath after walking about 100 feet. No chest pain.  No orthopnea/PND.  He is using CPAP.  He has felt irregular heart rhythm for about 3 wks and is in atrial fibrillation today.    ECG (personally reviewed): atrial fibrillation with RBBB, LAFB.   Labs (8/20): LDL 74 Labs (10/20): Na 126, K 4.5, creatinine  1.15 Labs (11/20): K 3.8, creatinine 1.3 Labs (12/20): K 3.9, creatinine 1.5, myeloma panel negative Labs (3/21): K 4.1, creatinine 1.6, TSH normal Labs (5/21): BNP 142, creatinine 2 Labs (9/21): K 4.5, creatinine 1.8  PMH: 1. COPD: PFTs (6/19) with moderate-severe obstruction.  Prior heavy smoker, quit 2018.  - Uses oxygen with exertion.  2. Atrial fibrillation: Paroxysmal. DCCV in 10/19, now on flecainide.  3. HTN 4. Chronic diastolic CHF: Echo (2/53) with EF 55%, mild LVH, mild LV dilation, mild RV dilation with mildly decreased systolic function, bioprosthetic aortic valve with mean gradient 15 mmHg.  - RHC (11/20): mean RA 9, PA 41/15, mean PCWP 13, CI 2.81, PVR 2.15 WU.   - PYP scan not suggestive of cardiac amyloidosis.  - Echo (5/21): EF 55%, basal inferior akinesis, mild RV dilation with normal function, PASP 46 mmHg, s/p Bentall, s/p bioprosthetic aortic valve with mean gradient 16 mmHg.  5. Type A aortic dissection: 8/18, patient had emergency repair with Bentall/bioprosthetic aortic valve.  Dissection extends down to abdominal aorta.  Coronaries re-implanted. Complicated by pericardial effusion requiring pericardiocentesis.  6. Chronic hyponatremia.  7. OSA: He is using CPAP.  8. ABIs (7/21): Normal  FH: Uncle with aortic dissection, mother with SCD at age 73.   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  Married, prior heavy smoker but quit in 2018.   ROS: All systems reviewed and negative except as per HPI.   Current Outpatient Medications  Medication Sig Dispense Refill  . albuterol (PROAIR  HFA) 108 (90 Base) MCG/ACT inhaler Inhale 2 puffs into the lungs every 4 (four) hours as needed for wheezing or shortness of breath. 54 g 3  . albuterol (PROVENTIL) (2.5 MG/3ML) 0.083% nebulizer solution Take 3 mLs (2.5 mg total) by nebulization every 8 (eight) hours as needed for wheezing or shortness of breath. 150 mL 1  . ALPRAZolam (XANAX) 1 MG tablet TAKE 1-1&1/2 TABLETS BY MOUTH AT  BEDTIME 40 tablet 1  . augmented betamethasone dipropionate (DIPROLENE-AF) 0.05 % cream Apply topically 2 (two) times daily as needed. 50 g 1  . diphenhydrAMINE (BENADRYL) 25 MG tablet Take 25 mg by mouth every 8 (eight) hours as needed (allergies/runny nose).     Marland Kitchen ELIQUIS 5 MG TABS tablet TAKE 1 TABLET TWICE A DAY 180 tablet 2  . escitalopram (LEXAPRO) 10 MG tablet Take 1 tablet (10 mg total) by mouth daily. 90 tablet 3  . esomeprazole (NEXIUM) 40 MG capsule Take 1 capsule (40 mg total) by mouth daily before breakfast. 90 capsule 3  . flecainide (TAMBOCOR) 50 MG tablet TAKE ONE AND ONE-HALF TABLETS TWICE A DAY 270 tablet 0  . fluticasone (FLONASE) 50 MCG/ACT nasal spray Place 1 spray into both nostrils 2 (two) times daily as needed for allergies. Use one spray in each nostril twice daily.    . Fluticasone-Umeclidin-Vilant (TRELEGY ELLIPTA) 200-62.5-25 MCG/INH AEPB Inhale 1 puff into the lungs daily. 180 each 3  . KLOR-CON M20 20 MEQ tablet TAKE 2 TABLETS (40 MEQ) DAILY 180 tablet 3  . losartan (COZAAR) 25 MG tablet Take 1 tablet (25 mg total) by mouth daily. 90 tablet 1  . metoprolol tartrate (LOPRESSOR) 25 MG tablet Take 1 tablet (25 mg total) by mouth 2 (two) times daily. 180 tablet 3  . rosuvastatin (CRESTOR) 5 MG tablet Take 1 tablet (5 mg total) by mouth daily. 90 tablet 1  . torsemide (DEMADEX) 20 MG tablet Take 40 mg (2 tabs) every other day ALTERNATING with 60 mg (3 tabs) every other day 270 tablet 3   No current facility-administered medications for this encounter.   BP 110/70   Pulse 95   Wt 90.3 kg (199 lb)   SpO2 93%   BMI 26.99 kg/m  General: NAD Neck: No JVD, no thyromegaly or thyroid nodule.  Lungs: Distant BS CV: Nondisplaced PMI.  Heart irregular S1/S2, no S3/S4, 3/6 SEM RUSB with clear S2.  No peripheral edema.  No carotid bruit.  Normal pedal pulses.  Abdomen: Soft, nontender, no hepatosplenomegaly, no distention.  Skin: Intact without lesions or rashes.  Neurologic:  Alert and oriented x 3.  Psych: Normal affect. Extremities: No clubbing or cyanosis.  HEENT: Normal.   Assessment/Plan: 1. Atrial fibrillation: Persistent.  He feels like he has been back in atrial fibrillation for about 3 wks.  I am concerned that it will worsen his diastolic CHF.    - Continue Eliquis 5 mg bid.  - Continue flecainide + metoprolol.  - He has not missed any Eliquis doses.  I will arrange for DCCV.  We discussed risks/benefits and he agrees to procedure.  2. Chronic diastolic CHF: Echo in 6/38 with EF 55%, mildly decreased RV systolic function, normally functioning bioprosthetic aortic valve. RHC in 11/20 after increasing torsemide showed near-normal filling pressures.  PYP study was not suggestive of ATTR amyloidosis and myeloma panel was negative.  Echo in 5/21 showed EF 55%, mild RV dilation/normal function, bioprosthetic aortic valve mean gradient 16 mmHg.  He is improved but symptoms are  still NYHA class III.  I suspect that COPD also contributes to his exercise intolerance. On exam, he does not look volume overloaded.  As above, I am concerned that recurrent atrial fibrillation will worsen CHF.    - Continue torsemide 60 mg daily alternating with 40 mg daily.  BMET/BNP today.  - Would be good Cardiomems candidate except he has not been hospitalized in the last year.  3. COPD: Moderate to severe obstruction on 6/19 PFTs, prior smoker.  As above, I suspect this contributes to his dyspnea.  - Continue followup with pulmonary.  - Will try to get him a smaller portable oxygen tank.  4. MBT:DHRCBULA CPAP.    5. Aortic dissection: S/p Type A aortic dissection, s/p Bentall with bioprosthetic aortic valve. Echo today showed stable bioprosthetic aortic valve.  Dr. Trula Slade has been following his residual dissected aorta.   - Given family history of aortic disease (uncle with aortic dissection, mother with SCD at age 58 - ?from dissection), his children's aortas need to be screened due to  risk for inherited connective tissue disorder.  We have discussed this.  He is not Marfanoid.   6. Hyponatremia: Cut back on po fluid intake.   Followup after DCCV.   [name] 06/04/2020

## 2020-06-04 NOTE — Patient Instructions (Addendum)
Labs done today, your results will be available in MyChart, we will contact you for abnormal readings.  Your physician has recommended that you have a Cardioversion (DCCV). Electrical Cardioversion uses a jolt of electricity to your heart either through paddles or wired patches attached to your chest. This is a controlled, usually prescheduled, procedure. Defibrillation is done under light anesthesia in the hospital, and you usually go home the day of the procedure. This is done to get your heart back into a normal rhythm. You are not awake for the procedure. Please see the instruction sheet given to you today.  You have been referred to follow-up with the A-Fib clinic in 3 weeks, they will call you for an appointment  Your physician recommends that you schedule a follow-up appointment in: 2 months with Dr Aundra Dubin  If you have any questions or concerns before your next appointment please send [name] a message through [facility] or call our office at [phone].    TO LEAVE A MESSAGE FOR THE NURSE SELECT OPTION 2, PLEASE LEAVE A MESSAGE INCLUDING: . YOUR NAME . DATE OF BIRTH . CALL BACK NUMBER . REASON FOR CALL**this is important as we prioritize the call backs  [place] AS LONG AS YOU CALL BEFORE 4:00 PM  At the [facility], you and your health needs are our priority. As part of our continuing mission to provide you with exceptional heart care, we have created designated Provider Care Teams. These Care Teams include your primary Cardiologist (physician) and Advanced Practice Providers (APPs- Physician Assistants and Nurse Practitioners) who all work together to provide you with the care you need, when you need it.   You may see any of the following providers on your designated Care Team at your next follow up: Marland Kitchen Dr Glori Bickers . Dr [name] . Darrick Grinder, NP . Lyda Jester, PA . Audry Riles, PharmD   Please be sure to bring in all  your medications bottles to every appointment.     CARDIOVERSION INSTRUCTIONS:  You are scheduled for a Cardioversion on Thursday 06/13/20 with Dr. Aundra Dubin.  Please arrive at the [facility] (Main Entrance A) at [facility]: [address] [place], [place] [address] at 10:00 am  DIET: Nothing to eat or drink after midnight except a sip of water with medications (see medication instructions below)  Medication Instructions: Hold TORSEMIDE 11/4 AM  Continue your anticoagulant: ELIQUIS, PLEASE DO NOT MISS ANY DOSES  COVID TEST: Tuesday 06/11/20 AT 12:30 PLEASE GO TO:   [place], [place] must have a responsible person to drive you home and stay in the waiting area during your procedure. Failure to do so could result in cancellation.  Bring your insurance cards.  *Special Note: Every effort is made to have your procedure done on time. Occasionally there are emergencies that occur at the hospital that may cause delays. Please be patient if a delay does occur.

## 2020-06-04 NOTE — Progress Notes (Signed)
PCP: Colon Branch, MD Cardiology: Dr. Caryl Comes HF Cardiology: Dr. Aundra Dubin  69 y.o. with history of COPD, paroxysmal atrial fibrillation, type A aortic dissection, and chronic diastolic CHF was referred by Dr. Caryl Comes for evaluation of CHF.  Prior to 2018, patient reports no significant limitations.  In 8/18, he had a Type A aortic dissection.  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  The dissection extended down to the abdominal aorta.  He was a heavy smoker, quit in 8/18.  He developed paroxysmal atrial fibrillation, had DCCV in 10/19.  He is on Eliquis and flecainide now.  Last echo in 2/20 showed EF 55%, mildly decreased RV systolic function, normally functioning bioprosthetic aortic valve.  PFTs in 6/19 showed moderate-severe obstruction.    Ever since his operation in 2018, Mr Deguire has had significant exertional dyspnea and has been quite limited.    He had RHC in 11/20, showing normal filling pressures, preserved cardiac output, and mild primarily pulmonary venous hypertension.    PYP scan was not suggestive of cardiac amyloidosis.   He saw pulmonary, PFTs showed moderate to severe obstruction.  He is using oxygen with exertion now, which he says helps. He is using CPAP.   Echo in 5/21 showed EF 55%, basal inferior akinesis, mild RV dilation with normal function, PASP 46 mmHg, s/p Bentall, s/p bioprosthetic aortic valve with mean gradient 16 mmHg.   He returns for followup of CHF.  Weight is down 3 lbs. Not wearing oxygen as much as he needs to, does not have a convenient portable oxygen tank.  He is chronically short of breath after walking about 100 feet. No chest pain.  No orthopnea/PND.  He is using CPAP.  He has felt irregular heart rhythm for about 3 wks and is in atrial fibrillation today.    ECG (personally reviewed): atrial fibrillation with RBBB, LAFB.   Labs (8/20): LDL 74 Labs (10/20): Na 126, K 4.5, creatinine  1.15 Labs (11/20): K 3.8, creatinine 1.3 Labs (12/20): K 3.9, creatinine 1.5, myeloma panel negative Labs (3/21): K 4.1, creatinine 1.6, TSH normal Labs (5/21): BNP 142, creatinine 2 Labs (9/21): K 4.5, creatinine 1.8  PMH: 1. COPD: PFTs (6/19) with moderate-severe obstruction.  Prior heavy smoker, quit 2018.  - Uses oxygen with exertion.  2. Atrial fibrillation: Paroxysmal. DCCV in 10/19, now on flecainide.  3. HTN 4. Chronic diastolic CHF: Echo (2/35) with EF 55%, mild LVH, mild LV dilation, mild RV dilation with mildly decreased systolic function, bioprosthetic aortic valve with mean gradient 15 mmHg.  - RHC (11/20): mean RA 9, PA 41/15, mean PCWP 13, CI 2.81, PVR 2.15 WU.   - PYP scan not suggestive of cardiac amyloidosis.  - Echo (5/21): EF 55%, basal inferior akinesis, mild RV dilation with normal function, PASP 46 mmHg, s/p Bentall, s/p bioprosthetic aortic valve with mean gradient 16 mmHg.  5. Type A aortic dissection: 8/18, patient had emergency repair with Bentall/bioprosthetic aortic valve.  Dissection extends down to abdominal aorta.  Coronaries re-implanted. Complicated by pericardial effusion requiring pericardiocentesis.  6. Chronic hyponatremia.  7. OSA: He is using CPAP.  8. ABIs (7/21): Normal  FH: Uncle with aortic dissection, mother with SCD at age 43.   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  Married, prior heavy smoker but quit in 2018.   ROS: All systems reviewed and negative except as per HPI.   Current Outpatient Medications  Medication Sig Dispense Refill  . albuterol (PROAIR  HFA) 108 (90 Base) MCG/ACT inhaler Inhale 2 puffs into the lungs every 4 (four) hours as needed for wheezing or shortness of breath. 54 g 3  . albuterol (PROVENTIL) (2.5 MG/3ML) 0.083% nebulizer solution Take 3 mLs (2.5 mg total) by nebulization every 8 (eight) hours as needed for wheezing or shortness of breath. 150 mL 1  . ALPRAZolam (XANAX) 1 MG tablet TAKE 1-1&1/2 TABLETS BY MOUTH AT  BEDTIME 40 tablet 1  . augmented betamethasone dipropionate (DIPROLENE-AF) 0.05 % cream Apply topically 2 (two) times daily as needed. 50 g 1  . diphenhydrAMINE (BENADRYL) 25 MG tablet Take 25 mg by mouth every 8 (eight) hours as needed (allergies/runny nose).     Marland Kitchen ELIQUIS 5 MG TABS tablet TAKE 1 TABLET TWICE A DAY 180 tablet 2  . escitalopram (LEXAPRO) 10 MG tablet Take 1 tablet (10 mg total) by mouth daily. 90 tablet 3  . esomeprazole (NEXIUM) 40 MG capsule Take 1 capsule (40 mg total) by mouth daily before breakfast. 90 capsule 3  . flecainide (TAMBOCOR) 50 MG tablet TAKE ONE AND ONE-HALF TABLETS TWICE A DAY 270 tablet 0  . fluticasone (FLONASE) 50 MCG/ACT nasal spray Place 1 spray into both nostrils 2 (two) times daily as needed for allergies. Use one spray in each nostril twice daily.    . Fluticasone-Umeclidin-Vilant (TRELEGY ELLIPTA) 200-62.5-25 MCG/INH AEPB Inhale 1 puff into the lungs daily. 180 each 3  . KLOR-CON M20 20 MEQ tablet TAKE 2 TABLETS (40 MEQ) DAILY 180 tablet 3  . losartan (COZAAR) 25 MG tablet Take 1 tablet (25 mg total) by mouth daily. 90 tablet 1  . metoprolol tartrate (LOPRESSOR) 25 MG tablet Take 1 tablet (25 mg total) by mouth 2 (two) times daily. 180 tablet 3  . rosuvastatin (CRESTOR) 5 MG tablet Take 1 tablet (5 mg total) by mouth daily. 90 tablet 1  . torsemide (DEMADEX) 20 MG tablet Take 40 mg (2 tabs) every other day ALTERNATING with 60 mg (3 tabs) every other day 270 tablet 3   No current facility-administered medications for this encounter.   BP 110/70   Pulse 95   Wt 90.3 kg (199 lb)   SpO2 93%   BMI 26.99 kg/m  General: NAD Neck: No JVD, no thyromegaly or thyroid nodule.  Lungs: Distant BS CV: Nondisplaced PMI.  Heart irregular S1/S2, no S3/S4, 3/6 SEM RUSB with clear S2.  No peripheral edema.  No carotid bruit.  Normal pedal pulses.  Abdomen: Soft, nontender, no hepatosplenomegaly, no distention.  Skin: Intact without lesions or rashes.  Neurologic:  Alert and oriented x 3.  Psych: Normal affect. Extremities: No clubbing or cyanosis.  HEENT: Normal.   Assessment/Plan: 1. Atrial fibrillation: Persistent.  He feels like he has been back in atrial fibrillation for about 3 wks.  I am concerned that it will worsen his diastolic CHF.    - Continue Eliquis 5 mg bid.  - Continue flecainide + metoprolol.  - He has not missed any Eliquis doses.  I will arrange for DCCV.  We discussed risks/benefits and he agrees to procedure.  2. Chronic diastolic CHF: Echo in 8/93 with EF 55%, mildly decreased RV systolic function, normally functioning bioprosthetic aortic valve. RHC in 11/20 after increasing torsemide showed near-normal filling pressures.  PYP study was not suggestive of ATTR amyloidosis and myeloma panel was negative.  Echo in 5/21 showed EF 55%, mild RV dilation/normal function, bioprosthetic aortic valve mean gradient 16 mmHg.  He is improved but symptoms are  still NYHA class III.  I suspect that COPD also contributes to his exercise intolerance. On exam, he does not look volume overloaded.  As above, I am concerned that recurrent atrial fibrillation will worsen CHF.    - Continue torsemide 60 mg daily alternating with 40 mg daily.  BMET/BNP today.  - Would be good Cardiomems candidate except he has not been hospitalized in the last year.  3. COPD: Moderate to severe obstruction on 6/19 PFTs, prior smoker.  As above, I suspect this contributes to his dyspnea.  - Continue followup with pulmonary.  - Will try to get him a smaller portable oxygen tank.  4. OIB:[id number] CPAP.    5. Aortic dissection: S/p Type A aortic dissection, s/p Bentall with bioprosthetic aortic valve. Echo today showed stable bioprosthetic aortic valve.  Dr. Trula Slade has been following his residual dissected aorta.   - Given family history of aortic disease (uncle with aortic dissection, mother with SCD at age 30 - ?from dissection), his children's aortas need to be screened due to  risk for inherited connective tissue disorder.  We have discussed this.  He is not Marfanoid.   6. Hyponatremia: Cut back on po fluid intake.   Followup after DCCV.   [name] 06/04/2020

## 2020-06-06 ENCOUNTER — Other Ambulatory Visit: Payer: Self-pay | Admitting: Internal Medicine

## 2020-06-10 ENCOUNTER — Telehealth: Payer: Self-pay | Admitting: Internal Medicine

## 2020-06-10 NOTE — Telephone Encounter (Signed)
Requesting: alprazolam 1mg  Contract: 04/18/2018 UDS: 05/18/2017 Low risk Last Visit: 05/20/2020 Next Visit: None scheduled Last Refill: 02/27/2020 #40 and 1RF Pt sig: 1-1.5 tab qhs  Please Advise

## 2020-06-10 NOTE — Telephone Encounter (Signed)
PDMP okay, Rx sent 

## 2020-06-11 ENCOUNTER — Other Ambulatory Visit: Payer: Self-pay

## 2020-06-11 ENCOUNTER — Ambulatory Visit (INDEPENDENT_AMBULATORY_CARE_PROVIDER_SITE_OTHER): Payer: Medicare Other | Admitting: Pulmonary Disease

## 2020-06-11 ENCOUNTER — Other Ambulatory Visit (HOSPITAL_COMMUNITY)
Admission: RE | Admit: 2020-06-11 | Discharge: 2020-06-11 | Disposition: A | Discharge status: Home / Self Care | Payer: Medicare Other | Source: Ambulatory Visit | Attending: Cardiology | Admitting: Cardiology

## 2020-06-11 ENCOUNTER — Encounter: Payer: Self-pay | Admitting: Pulmonary Disease

## 2020-06-11 VITALS — BP 124/68 | HR 83 | Temp 97.3°F | Ht 72.0 in | Wt 193.0 lb

## 2020-06-11 DIAGNOSIS — Z01812 Encounter for preprocedural laboratory examination: Secondary | ICD-10-CM | POA: Insufficient documentation

## 2020-06-11 DIAGNOSIS — Z20822 Contact with and (suspected) exposure to covid-19: Secondary | ICD-10-CM | POA: Diagnosis not present

## 2020-06-11 DIAGNOSIS — J449 Chronic obstructive pulmonary disease, unspecified: Secondary | ICD-10-CM

## 2020-06-11 LAB — SARS CORONAVIRUS 2 (TAT 6-24 HRS): SARS Coronavirus 2: NEGATIVE

## 2020-06-11 MED ORDER — IPRATROPIUM-ALBUTEROL 0.5-2.5 (3) MG/3ML IN SOLN: 3.0000 mL | Freq: Four times a day (QID) | RESPIRATORY_TRACT | 5 refills | Status: DC

## 2020-06-11 NOTE — Progress Notes (Signed)
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 Followed with Dr. Roxy Manns.  CT scan in January showed large bilateral effusion.  He was started on Lasix with improvement in effusion  He has followed up with Dr. Ricard Dillon and underwent thoracentesis of the right pleural effusion on 11/25/17.  I do not see any pleural studies sent from the procedure. Underwent endovascular repair of thoracic aortic aneurysm on 03/30/2018 without any respiratory issue  Pets: Dog, no birds, [place] farm [place]. Occupation: Retired Museum/gallery curator. Exposures: No known exposures, no mold, he has a hot tub but does not use it Smoking history: 90-pack-year smoking history.  Quit in 2018 Travel History: Not significant  Interim history: Breo and Incruse changed to Trelegy at last visit He is hardly using the inhaler because it causes hoarseness of voice  Continues on supplemental oxygen Has chronic cough with mucus production.  No new issues today.  Outpatient Encounter Medications as of 06/11/2020  Medication Sig  . albuterol (PROAIR HFA) 108 (90 Base) MCG/ACT inhaler Inhale 2 puffs into the lungs every 4 (four) hours as needed for wheezing or shortness of breath.  Marland Kitchen albuterol (PROVENTIL) (2.5 MG/3ML) 0.083% nebulizer solution Take 3 mLs (2.5 mg total) by nebulization every 8 (eight) hours as needed for wheezing or shortness of breath.  . ALPRAZolam (XANAX) 1 MG tablet TAKE 1-1&1/2 TABLETS BY MOUTH AT BEDTIME  . augmented betamethasone dipropionate (DIPROLENE-AF) 0.05 % cream Apply topically 2 (two) times daily as needed. (Patient taking differently: Apply 1 application topically 2  (two) times daily as needed (rash). )  . bisacodyl (DULCOLAX) 5 MG EC tablet Take 5 mg by mouth daily as needed for moderate constipation.  . diphenhydrAMINE (BENADRYL) 25 MG tablet Take 25 mg by mouth every 8 (eight) hours as needed (allergies/runny nose).   Marland Kitchen ELIQUIS 5 MG TABS tablet TAKE 1 TABLET TWICE A DAY (Patient taking differently: Take 5 mg by mouth 2 (two) times daily. )  . escitalopram (LEXAPRO) 10 MG tablet Take 1 tablet (10 mg total) by mouth daily.  Marland Kitchen esomeprazole (NEXIUM) 40 MG capsule Take 1 capsule (40 mg total) by mouth daily before breakfast.  . flecainide (TAMBOCOR) 50 MG tablet TAKE ONE AND ONE-HALF TABLETS TWICE A DAY (Patient taking differently: Take 75 mg by mouth 2 (two) times daily. )  . fluticasone (FLONASE) 50 MCG/ACT nasal spray Place 1 spray into both nostrils 2 (two) times daily as needed for allergies. Use one spray in each nostril twice daily.  . Fluticasone-Umeclidin-Vilant (TRELEGY ELLIPTA) 200-62.5-25 MCG/INH AEPB Inhale 1 puff into the lungs daily.  Marland Kitchen KLOR-CON M20 20 MEQ tablet TAKE 2 TABLETS (40 MEQ) DAILY (Patient taking differently: Take 40 mEq by mouth daily. )  . losartan (COZAAR) 25 MG tablet Take 1 tablet (25 mg total) by mouth daily.  . rosuvastatin (CRESTOR) 5 MG tablet Take 1 tablet (5 mg total) by mouth daily. (Patient taking differently: Take 5 mg by mouth at bedtime. )  . torsemide (DEMADEX) 20 MG tablet Take 40 mg (2 tabs) every other day ALTERNATING with 60 mg (3 tabs) every other day (Patient taking differently:  Take 40-60 mg by mouth See admin instructions. Take 40 mg (2 tabs) every other day ALTERNATING with 60 mg (3 tabs) every other day)  . metoprolol tartrate (LOPRESSOR) 25 MG tablet Take 1 tablet (25 mg total) by mouth 2 (two) times daily.   No facility-administered encounter medications on file as of 06/11/2020.   Physical Exam: Blood pressure 124/68, pulse 83, temperature (!) 97.3 F (36.3 C), temperature source Skin, height 6' (1.829 m),  weight 193 lb (87.5 kg), SpO2 92 %. Gen:      No acute distress HEENT:  EOMI, sclera anicteric Neck:     No masses; no thyromegaly Lungs:    Clear to auscultation bilaterally; normal respiratory effort CV:         Regular rate and rhythm; no murmurs Abd:      + bowel sounds; soft, non-tender; no palpable masses, no distension Ext:    No edema; adequate peripheral perfusion Skin:      Warm and dry; no rash Neuro: alert and oriented x 3 Psych: normal mood and affect  Data Reviewed: Imaging CT 03/22/17- moderate emphysema, left upper lobe 3.6 mm nodule.  CT angio 08/23/17-emphysema, large-moderate right and left effusion.  Screening CT 10/11/2019-lobular emphysema, calcified granuloma in the right upper lobe.  Small partially loculated bilateral effusions with scarring.  CTA 01/29/2020-stable emphysema, bilateral effusions I have reviewed the images personally.  PFTs 01/05/17-FVC 4.02 [80%], FEV1 3.03 [81%], F/F 75 12/24/15-FVC 4.27 [91%], FEV1 2.39 [86%], F/F 79 Normal spirometry  12/25/13 FVC 4.96 [103%], FEV1 3.51 [97%], F/F 71, p.o. 314%, DLCO 72% Minimal obstruction, small airway disease, minimal diffusion defect  01/25/2018 FVC 2.92 (62%], FEV1 1.98 [57%], F/F 68, TLC 66%, DLCO 32% Moderate-severe obstruction, severe restriction and diffusion defect.  Labs Blood allergy profile 01/25/2018- IgE 841, sensitive to grass, cat, dog, tree pollen CBC 01/25/2018- WBC 6.7, eos 4.8%, absolute eosinophil count 300 Alpha-1 antitrypsin 01/25/2018- 166, PI MM  Assessment:  COPD PFTs reviewed with moderate-severe obstruction He is not using his Trelegy inhaler due to hoarseness Stop the inhaled corticosteroid and start him on duo nebs 4 times a day. He is not sure if he will be compliant with the nebs as well.  Will reassess on return visit.  Pleural effusions CT scan reviewed with bilateral effusions that are partially loculated with some scarring These effusions are present since aortic  surgery in 2018.  Suspect this is postoperative fluid collections which are chronic now.  Also may have a component of CHF secondary to chronic diastolic heart failure, paroxysmal atrial fibrillation. No evidence of interstitial lung disease. Would not recommend any invasive procedure at present Continue monitoring.  OSA Continue CPAP.  Health maintenance Pneumovax 10/16/13, Prevnar 05/10/14 Vaccinated against Covid.  Recommended booster vaccination  Plan/Recommendations: -Stop Trelegy inhaler, start duo nebs 4 times a day -Continue supplemental oxygen  [name] MD [facility] 06/11/2020, 11:38 AM  CC: Colon Branch, MD

## 2020-06-11 NOTE — Patient Instructions (Addendum)
We will stop the Trelegy inhaler and order a nebulizer machine and start duo nebs 4 times a day on a regular basis Continue supplemental oxygen  Continue annual screening CT Follow-up in 1 year.  Also no

## 2020-06-13 ENCOUNTER — Other Ambulatory Visit: Payer: Self-pay

## 2020-06-13 ENCOUNTER — Ambulatory Visit (HOSPITAL_COMMUNITY)
Admission: RE | Admit: 2020-06-13 | Discharge: 2020-06-13 | Disposition: A | Discharge status: Home / Self Care | Payer: Medicare Other | Attending: Cardiology | Admitting: Cardiology

## 2020-06-13 ENCOUNTER — Encounter (HOSPITAL_COMMUNITY)
Admission: RE | Disposition: A | Discharge status: Home / Self Care | Payer: Self-pay | Source: Home / Self Care | Attending: Cardiology

## 2020-06-13 ENCOUNTER — Ambulatory Visit (HOSPITAL_COMMUNITY): Payer: Medicare Other | Admitting: Anesthesiology

## 2020-06-13 ENCOUNTER — Encounter (HOSPITAL_COMMUNITY): Payer: Self-pay | Admitting: Cardiology

## 2020-06-13 DIAGNOSIS — G4733 Obstructive sleep apnea (adult) (pediatric): Secondary | ICD-10-CM | POA: Insufficient documentation

## 2020-06-13 DIAGNOSIS — I4819 Other persistent atrial fibrillation: Secondary | ICD-10-CM | POA: Diagnosis not present

## 2020-06-13 DIAGNOSIS — J449 Chronic obstructive pulmonary disease, unspecified: Secondary | ICD-10-CM | POA: Insufficient documentation

## 2020-06-13 DIAGNOSIS — Z953 Presence of xenogenic heart valve: Secondary | ICD-10-CM | POA: Insufficient documentation

## 2020-06-13 DIAGNOSIS — I313 Pericardial effusion (noninflammatory): Secondary | ICD-10-CM | POA: Diagnosis not present

## 2020-06-13 DIAGNOSIS — I110 Hypertensive heart disease with heart failure: Secondary | ICD-10-CM | POA: Diagnosis not present

## 2020-06-13 DIAGNOSIS — Z7901 Long term (current) use of anticoagulants: Secondary | ICD-10-CM | POA: Diagnosis not present

## 2020-06-13 DIAGNOSIS — I5032 Chronic diastolic (congestive) heart failure: Secondary | ICD-10-CM | POA: Diagnosis not present

## 2020-06-13 DIAGNOSIS — I4891 Unspecified atrial fibrillation: Secondary | ICD-10-CM | POA: Diagnosis not present

## 2020-06-13 DIAGNOSIS — Z87891 Personal history of nicotine dependence: Secondary | ICD-10-CM | POA: Insufficient documentation

## 2020-06-13 DIAGNOSIS — E785 Hyperlipidemia, unspecified: Secondary | ICD-10-CM | POA: Diagnosis not present

## 2020-06-13 DIAGNOSIS — I4510 Unspecified right bundle-branch block: Secondary | ICD-10-CM | POA: Diagnosis not present

## 2020-06-13 HISTORY — PX: CARDIOVERSION: SHX1299

## 2020-06-13 SURGERY — CARDIOVERSION
Anesthesia: General

## 2020-06-13 MED ORDER — PROPOFOL 10 MG/ML IV BOLUS
INTRAVENOUS | Status: DC | PRN
  Administered 2020-06-13: 70 mg via INTRAVENOUS

## 2020-06-13 MED ORDER — LIDOCAINE 2% (20 MG/ML) 5 ML SYRINGE
INTRAMUSCULAR | Status: DC | PRN
  Administered 2020-06-13: 30 mg via INTRAVENOUS

## 2020-06-13 MED ORDER — SODIUM CHLORIDE 0.9 % IV SOLN
INTRAVENOUS | Status: DC
  Administered 2020-06-13: 500 mL via INTRAVENOUS

## 2020-06-13 NOTE — Interval H&P Note (Signed)
History and Physical Interval Note:  06/13/2020 11:50 AM  Sean Riley  has presented today for surgery, with the diagnosis of AIFB.  The various methods of treatment have been discussed with the patient and family. After consideration of risks, benefits and other options for treatment, the patient has consented to  Procedure(s): CARDIOVERSION (N/A) as a surgical intervention.  The patient's history has been reviewed, patient examined, no change in status, stable for surgery.  I have reviewed the patient's chart and labs.  Questions were answered to the patient's satisfaction.     [name]

## 2020-06-13 NOTE — Anesthesia Preprocedure Evaluation (Addendum)
Anesthesia Evaluation  Patient identified by MRN, date of birth, ID band Patient awake    Reviewed: Allergy & Precautions, H&P , NPO status , Patient's Chart, lab work & pertinent test results  History of Anesthesia Complications Negative for: history of anesthetic complications  Airway Mallampati: II  TM Distance: >3 FB Neck ROM: full    Dental  (+) Dental Advisory Given   Pulmonary COPD,  oxygen dependent, former smoker,    breath sounds clear to auscultation       Cardiovascular hypertension, Pt. on medications and Pt. on home beta blockers + Peripheral Vascular Disease (s/p [place])  + dysrhythmias Atrial Fibrillation + Valvular Problems/Murmurs (s/p AVR)  Rhythm:irregular Rate:Normal  S/p Bentall aortic root replacement and AVR (2018)   Neuro/Psych PSYCHIATRIC DISORDERS Depression negative neurological ROS     GI/Hepatic Neg liver ROS, GERD  Medicated and Controlled,  Endo/Other  negative endocrine ROS  Renal/GU negative Renal ROS     Musculoskeletal  (+) Arthritis ,   Abdominal   Peds  Hematology eliquis   Anesthesia Other Findings   Reproductive/Obstetrics                            Anesthesia Physical Anesthesia Plan  ASA: III  Anesthesia Plan: General   Post-op Pain Management:    Induction: Intravenous  PONV Risk Score and Plan: 2 and Propofol infusion and Treatment may vary due to age or medical condition  Airway Management Planned: Mask and Natural Airway  Additional Equipment: None  Intra-op Plan:   Post-operative Plan:   Informed Consent: I have reviewed the patients History and Physical, chart, labs and discussed the procedure including the risks, benefits and alternatives for the proposed anesthesia with the patient or authorized representative who has indicated his/her understanding and acceptance.     Dental advisory given  Plan Discussed with: CRNA,  Anesthesiologist and Surgeon  Anesthesia Plan Comments:        Anesthesia Quick Evaluation

## 2020-06-13 NOTE — Procedures (Signed)
Electrical Cardioversion Procedure Note Sean Riley [id number] 1951-05-09  Procedure: Electrical Cardioversion Indications:  Atrial Fibrillation  Procedure Details Consent: Risks of procedure as well as the alternatives and risks of each were explained to the (patient/caregiver).  Consent for procedure obtained. Time Out: Verified patient identification, verified procedure, site/side was marked, verified correct patient position, special equipment/implants available, medications/allergies/relevent history reviewed, required imaging and test results available.  Performed  Patient placed on cardiac monitor, pulse oximetry, supplemental oxygen as necessary.  Sedation given: Propofol per anesthesiology Pacer pads placed anterior and posterior chest.  Cardioverted 1 time(s).  Cardioverted at [place].  Evaluation Findings: Post procedure EKG shows: NSR Complications: None Patient did tolerate procedure well.   Sean Riley 06/13/2020, 11:56 AM

## 2020-06-13 NOTE — Transfer of Care (Signed)
Immediate Anesthesia Transfer of Care Note  Patient: Sean Riley  Procedure(s) Performed: CARDIOVERSION (N/A )  Patient Location: PACU and Endoscopy Unit  Anesthesia Type:General  Level of Consciousness: drowsy  Airway & Oxygen Therapy: Patient Spontanous Breathing and Patient connected to nasal cannula oxygen  Post-op Assessment: Report given to RN and Post -op Vital signs reviewed and stable  Post vital signs: Reviewed and stable  Last Vitals:  Vitals Value Taken Time  BP    Temp    Pulse    Resp    SpO2      Last Pain:  Vitals:   06/13/20 1108  TempSrc: Oral  PainSc: 0-No pain         Complications: No complications documented.

## 2020-06-13 NOTE — Discharge Instructions (Signed)
Electrical Cardioversion Electrical cardioversion is the delivery of a jolt of electricity to restore a normal rhythm to the heart. A rhythm that is too fast or is not regular keeps the heart from pumping well. In this procedure, sticky patches or metal paddles are placed on the chest to deliver electricity to the heart from a device. This procedure may be done in an emergency if:  There is low or no blood pressure as a result of the heart rhythm.  Normal rhythm must be restored as fast as possible to protect the brain and heart from further damage.  It may save a life. This may also be a scheduled procedure for irregular or fast heart rhythms that are not immediately life-threatening. Tell a health care provider about:  Any allergies you have.  All medicines you are taking, including vitamins, herbs, eye drops, creams, and over-the-counter medicines.  Any problems you or family members have had with anesthetic medicines.  Any blood disorders you have.  Any surgeries you have had.  Any medical conditions you have.  Whether you are pregnant or may be pregnant. What are the risks? Generally, this is a safe procedure. However, problems may occur, including:  Allergic reactions to medicines.  A blood clot that breaks free and travels to other parts of your body.  The possible return of an abnormal heart rhythm within hours or days after the procedure.  Your heart stopping (cardiac arrest). This is rare. What happens before the procedure? Medicines  Your health care provider may have you start taking: ? Blood-thinning medicines (anticoagulants) so your blood does not clot as easily. ? Medicines to help stabilize your heart rate and rhythm.  Ask your health care provider about: ? Changing or stopping your regular medicines. This is especially important if you are taking diabetes medicines or blood thinners. ? Taking medicines such as aspirin and ibuprofen. These medicines can  thin your blood. Do not take these medicines unless your health care provider tells you to take them. ? Taking over-the-counter medicines, vitamins, herbs, and supplements. General instructions  Follow instructions from your health care provider about eating or drinking restrictions.  Plan to have someone take you home from the hospital or clinic.  If you will be going home right after the procedure, plan to have someone with you for 24 hours.  Ask your health care provider what steps will be taken to help prevent infection. These may include washing your skin with a germ-killing soap. What happens during the procedure?   An IV will be inserted into one of your veins.  Sticky patches (electrodes) or metal paddles may be placed on your chest.  You will be given a medicine to help you relax (sedative).  An electrical shock will be delivered. The procedure may vary among health care providers and hospitals. What can I expect after the procedure?  Your blood pressure, heart rate, breathing rate, and blood oxygen level will be monitored until you leave the hospital or clinic.  Your heart rhythm will be watched to make sure it does not change.  You may have some redness on the skin where the shocks were given. Follow these instructions at home:  Do not drive for 24 hours if you were given a sedative during your procedure.  Take over-the-counter and prescription medicines only as told by your health care provider.  Ask your health care provider how to check your pulse. Check it often.  Rest for 48 hours after the procedure or   as told by your health care provider.  Avoid or limit your caffeine use as told by your health care provider.  Keep all follow-up visits as told by your health care provider. This is important. Contact a health care provider if:  You feel like your heart is beating too quickly or your pulse is not regular.  You have a serious muscle cramp that does not go  away. Get help right away if:  You have discomfort in your chest.  You are dizzy or you feel faint.  You have trouble breathing or you are short of breath.  Your speech is slurred.  You have trouble moving an arm or leg on one side of your body.  Your fingers or toes turn cold or blue. Summary  Electrical cardioversion is the delivery of a jolt of electricity to restore a normal rhythm to the heart.  This procedure may be done right away in an emergency or may be a scheduled procedure if the condition is not an emergency.  Generally, this is a safe procedure.  After the procedure, check your pulse often as told by your health care provider. This information is not intended to replace advice given to you by your health care provider. Make sure you discuss any questions you have with your health care provider. Document Revised: 02/27/2019 Document Reviewed: 02/27/2019 Elsevier Patient Education  2020 [employer]  

## 2020-06-13 NOTE — Anesthesia Postprocedure Evaluation (Signed)
Anesthesia Post Note  Patient: Sean Riley  Procedure(s) Performed: CARDIOVERSION (N/A )     Patient location during evaluation: Endoscopy Anesthesia Type: General Level of consciousness: awake and alert and patient cooperative Pain management: pain level controlled Vital Signs Assessment: post-procedure vital signs reviewed and stable Respiratory status: spontaneous breathing, nonlabored ventilation, respiratory function stable and patient connected to nasal cannula oxygen Cardiovascular status: blood pressure returned to baseline and stable Postop Assessment: no apparent nausea or vomiting Anesthetic complications: no   No complications documented.  Last Vitals:  Vitals:   06/13/20 1108  BP: 133/81  Pulse: 95  Resp: 20  Temp: 36.6 C  SpO2: 98%    Last Pain:  Vitals:   06/13/20 1108  TempSrc: Oral  PainSc: 0-No pain                 Bertis Hustead,E. Chasty Randal

## 2020-06-17 ENCOUNTER — Encounter (HOSPITAL_COMMUNITY): Payer: Self-pay | Admitting: Cardiology

## 2020-06-18 ENCOUNTER — Other Ambulatory Visit: Payer: Self-pay | Admitting: Physician Assistant

## 2020-06-25 ENCOUNTER — Ambulatory Visit (HOSPITAL_COMMUNITY): Payer: Medicare Other | Admitting: Physician Assistant

## 2020-06-26 ENCOUNTER — Other Ambulatory Visit: Payer: Self-pay

## 2020-06-26 ENCOUNTER — Encounter (HOSPITAL_COMMUNITY): Payer: Self-pay | Admitting: Physician Assistant

## 2020-06-26 ENCOUNTER — Ambulatory Visit (HOSPITAL_COMMUNITY)
Admission: RE | Admit: 2020-06-26 | Discharge: 2020-06-26 | Disposition: A | Discharge status: Home / Self Care | Payer: Medicare Other | Source: Ambulatory Visit | Attending: Physician Assistant | Admitting: Physician Assistant

## 2020-06-26 VITALS — BP 146/80 | HR 58 | Ht 72.0 in | Wt 198.8 lb

## 2020-06-26 DIAGNOSIS — I471 Supraventricular tachycardia: Secondary | ICD-10-CM | POA: Insufficient documentation

## 2020-06-26 DIAGNOSIS — D6869 Other thrombophilia: Secondary | ICD-10-CM | POA: Insufficient documentation

## 2020-06-26 DIAGNOSIS — I5032 Chronic diastolic (congestive) heart failure: Secondary | ICD-10-CM | POA: Insufficient documentation

## 2020-06-26 DIAGNOSIS — Z7901 Long term (current) use of anticoagulants: Secondary | ICD-10-CM | POA: Insufficient documentation

## 2020-06-26 DIAGNOSIS — G4733 Obstructive sleep apnea (adult) (pediatric): Secondary | ICD-10-CM | POA: Insufficient documentation

## 2020-06-26 DIAGNOSIS — Z8249 Family history of ischemic heart disease and other diseases of the circulatory system: Secondary | ICD-10-CM | POA: Diagnosis not present

## 2020-06-26 DIAGNOSIS — J449 Chronic obstructive pulmonary disease, unspecified: Secondary | ICD-10-CM | POA: Diagnosis not present

## 2020-06-26 DIAGNOSIS — I4819 Other persistent atrial fibrillation: Secondary | ICD-10-CM | POA: Insufficient documentation

## 2020-06-26 DIAGNOSIS — Z87891 Personal history of nicotine dependence: Secondary | ICD-10-CM | POA: Diagnosis not present

## 2020-06-26 DIAGNOSIS — Z79899 Other long term (current) drug therapy: Secondary | ICD-10-CM | POA: Diagnosis not present

## 2020-06-26 DIAGNOSIS — I110 Hypertensive heart disease with heart failure: Secondary | ICD-10-CM | POA: Insufficient documentation

## 2020-06-26 NOTE — Progress Notes (Signed)
Primary Care Physician: Colon Branch, MD Primary Cardiologist: Dr Aundra Dubin Primary Electrophysiologist: Dr Caryl Comes Referring Physician: Dr Lenda Kelp is a 69 y.o. 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  The patient was initially diagnosed with atrial fibrillation remotely and has been maintained on flecainide, followed by Dr Caryl Comes. Patient is on Eliquis for a CHADS2VASC score of 3. He developed recurrent afib on follow up with Dr Aundra Dubin 06/04/20 and underwent DCCV 06/13/20. He reports that he feels he is back to his baseline now post DCCV. He is in SR. He denies any bleeding issues on anticoagulation.   Today, he denies symptoms of palpitations, chest pain, shortness of breath, orthopnea, PND, lower extremity edema, dizziness, presyncope, syncope, bleeding, or neurologic sequela. The patient is tolerating medications without difficulties and is otherwise without complaint today.    Atrial Fibrillation Risk Factors:  he does have symptoms or diagnosis of sleep apnea. he is compliant with CPAP therapy.   he has a BMI of Body mass index is 26.96 kg/m.Marland Kitchen Filed Weights   06/26/20 1055  Weight: 90.2 kg    Family History  Problem Relation Age of Onset   Hypertension Mother    Hypertension Father    Benign prostatic hyperplasia Father        S/P TURP   Heart attack Maternal Grandmother        MI in 35s   Breast cancer Maternal Grandmother    Arrhythmia Brother         X 2   Heart attack Maternal Aunt        MI in 69s   Stroke Neg Hx    Diabetes Neg Hx    Colon cancer Neg Hx    Colon polyps Neg Hx    Stomach cancer Neg Hx    Rectal cancer Neg Hx    Esophageal cancer Neg Hx    Prostate cancer Neg Hx    Other Neg Hx        gynecomastia     Atrial Fibrillation  Management history:  Previous antiarrhythmic drugs: flecainide  Previous cardioversions: 10/219, 06/13/20 Previous ablations: none CHADS2VASC score: 3 Anticoagulation history: Eliquis   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[place]) 03/23/2017   Atrial fibrillation (HCC)    Cataract    removed both eyes    GERD (gastroesophageal reflux disease)    HYPERLIPIDEMIA    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 LUTS    Hypertension    Microscopic hematuria    negative cystoscopy   NONSPECIFIC ABNORMAL ELECTROCARDIOGRAM    Pleural effusion on right    Pleural effusion, bilateral    S/P aortic dissection repair 03/24/2017   Biological Bentall aortic root replacement + resection and grafting of entire ascending aorta, transverse aortic arch and proximal descending thoracic aorta with elephant trunk distal anastomosis and debranching of aortic arch vessels   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   25 mm [employer] Ease bovine pericardial tissue valve and 28 mm Gelweave Valsalva aortic root graft with reimplantation of left main and right coronary arteries   Varicose veins    Past Surgical History:  Procedure Laterality Date   APPENDECTOMY     CARDIAC  CATHETERIZATION      X3;Dr Caryl Comes, last 01/20/12: mild non-obstructive CAD, normal LV systolic function   CARDIOVERSION N/A 05/31/2018   Procedure: CARDIOVERSION;  Surgeon: Sueanne Margarita, MD;  Location: [facility] ENDOSCOPY;  Service: Cardiovascular;  Laterality: N/A;   CARDIOVERSION N/A 06/13/2020   Procedure: CARDIOVERSION;  Surgeon: Larey Dresser, MD;  Location: [place];  Service: Cardiovascular;  Laterality: N/A;   COLONOSCOPY     CYSTOSCOPY  1978   Dr Hartley Barefoot   EYE SURGERY     IR THORACENTESIS ASP PLEURAL SPACE W/IMG GUIDE  11/25/2017   KNEE ARTHROSCOPY  2012   Dr Theda Sers   LUMBAR LAMINECTOMY  1987   Dr Lestine Box  2007   PERICARDIOCENTESIS N/A 04/07/2017   Procedure: PERICARDIOCENTESIS;  Surgeon: Sherren Mocha, MD;  Location: [place] CV LAB;  Service: Cardiovascular;  Laterality: N/A;   POLYPECTOMY     REPAIR OF ACUTE ASCENDING THORACIC AORTIC DISSECTION N/A 03/23/2017   Procedure: REPAIR OF ACUTE ASCENDING THORACIC AORTIC DISSECTION.  Bentall procedure.  Aortic root repleacement with bioprosthetic valve.  Reimplantation of left and right coronary arteries.  Total resection of transverse aortic arch.  Elephant trunk distal anastomosis and debranching of arch vessels.;  Surgeon: Rexene Alberts, MD;  Location: [place];  Service: Vascular;  Laterality: N/A;   RIGHT HEART CATH N/A 06/15/2019   Procedure: RIGHT HEART CATH;  Surgeon: Larey Dresser, MD;  Location: [place] [facility];  Service: Cardiovascular;  Laterality: N/A;   ROTATOR CUFF REPAIR     SHOULDER ARTHROSCOPY  2011   Dr Theda Sers   SHOULDER ARTHROSCOPY Right 08/2015   SPINAL FUSION  1986   Dr Rolin Barry   TEE WITHOUT CARDIOVERSION N/A 03/23/2017   Procedure: TRANSESOPHAGEAL ECHOCARDIOGRAM (TEE);  Surgeon: Rexene Alberts, MD;  Location: [place];  Service: Open Heart Surgery;  Laterality: N/A;   THORACIC AORTIC ENDOVASCULAR STENT GRAFT N/A 03/30/2018   Procedure: THORACIC AORTIC ENDOVASCULAR STENT GRAFT WITH INTRAVASCULAR ULTRASOUND;  Surgeon: Serafina Mitchell, MD;  Location: [facility] OR;  Service: Vascular;  Laterality: N/A;   TRACHEOSTOMY     age 65 for croup    Current Outpatient Medications  Medication Sig Dispense Refill   albuterol (PROAIR HFA) 108 (90 Base) MCG/ACT inhaler Inhale 2 puffs into the lungs every 4 (four) hours as needed for wheezing or shortness of breath. 54 g 3   albuterol (PROVENTIL) (2.5 MG/3ML) 0.083% nebulizer solution Take 3 mLs (2.5 mg total) by nebulization every 8 (eight) hours as needed for wheezing or shortness of breath. 150 mL 1   ALPRAZolam (XANAX) 1 MG tablet TAKE 1-1&1/2 TABLETS BY MOUTH AT  BEDTIME 40 tablet 1   augmented betamethasone dipropionate (DIPROLENE-AF) 0.05 % cream Apply topically 2 (two) times daily as needed. 50 g 1   bisacodyl (DULCOLAX) 5 MG EC tablet Take 5 mg by mouth daily as needed for moderate constipation.     diphenhydrAMINE (BENADRYL) 25 MG tablet Take 25 mg by mouth every 8 (eight) hours as needed (allergies/runny nose).      ELIQUIS 5 MG TABS tablet TAKE 1 TABLET TWICE A DAY 180 tablet 2   escitalopram (LEXAPRO) 10 MG tablet Take 1 tablet (10 mg total) by mouth daily. 90 tablet 3   esomeprazole (NEXIUM) 40 MG capsule Take 1 capsule (40 mg total) by mouth daily before breakfast. 90 capsule 3   flecainide (TAMBOCOR) 50 MG tablet TAKE ONE AND ONE-HALF TABLETS TWICE A DAY 270 tablet 0  fluticasone (FLONASE) 50 MCG/ACT nasal spray Place 1 spray into both nostrils 2 (two) times daily as needed for allergies. Use one spray in each nostril twice daily.     ipratropium-albuterol (DUONEB) 0.5-2.5 (3) MG/3ML SOLN Take 3 mLs by nebulization 4 (four) times daily. 360 mL 5   KLOR-CON M20 20 MEQ tablet TAKE 2 TABLETS (40 MEQ) DAILY 180 tablet 3   losartan (COZAAR) 25 MG tablet Take 1 tablet (25 mg total) by mouth daily. 90 tablet 1   metoprolol tartrate (LOPRESSOR) 25 MG tablet Take 1 tablet (25 mg total) by mouth 2 (two) times daily. Please make yearly appt with Dr. Caryl Comes for January 2022 for future refills. Thank you 1st attempt 180 tablet 0   rosuvastatin (CRESTOR) 5 MG tablet Take 1 tablet (5 mg total) by mouth daily. 90 tablet 1   torsemide (DEMADEX) 20 MG tablet Take 40 mg (2 tabs) every other day ALTERNATING with 60 mg (3 tabs) every other day 270 tablet 3   No current facility-administered medications for this encounter.    Allergies  Allergen Reactions   Simvastatin Other (See Comments)    Mental status changes   Celecoxib Other (See Comments)    GI UPSET AND INFLAMMATION   Codeine Other (See Comments)    GI UPSET AND INFLAMMATION    Nsaids Other (See Comments)    GI UPSET AND INFLAMMATION (can tolerate via IV)   Tape Other (See Comments)    Medical tape and Band-Aids PULL OFF THE SKIN; please use Coban wrap   Cephalexin Itching and Rash   Latex Rash    Social History   Socioeconomic History   Marital status: Married    Spouse name: Not on file   Number of children: 2   Years of education: Not on file   Highest education level: Not on file  Occupational History   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   Tobacco Use   Smoking status: Former Smoker    Packs/day: 2.00    Years: 45.00    Pack years: 90.00    Types: Cigarettes    Quit date: 03/22/2017    Years since quitting: 3.2   Smokeless tobacco: Never Used   Tobacco comment: 2 ppd , quit 03/2017  Vaping Use   Vaping Use: Never used  Substance and Sexual Activity   Alcohol use: Not Currently    Alcohol/week: 0.0 standard drinks    Comment: 5 cans of beer daily   Drug use: No   Sexual activity: Never  Other Topics Concern   Not on file  Social History Narrative   Lives w/ wife   Social Determinants of Health   Financial Resource Strain: Low Risk    Difficulty of Paying Living Expenses: Not hard at all  Food Insecurity: No Food Insecurity   Worried About Charity fundraiser in the Last Year: Never true   Ran Out of Food in the Last Year: Never true  Transportation Needs: No Transportation Needs   Lack of Transportation (Medical): No   Lack of Transportation (Non-Medical): No  Physical Activity:    Days of Exercise per Week: Not on file   Minutes of Exercise per Session: Not on file  Stress:    Feeling of Stress : Not on file  Social Connections:    Frequency of Communication with Friends and Family: Not on file   Frequency of Social Gatherings with Friends and Family: Not on file   Attends Religious Services: Not on  file   Active Member of Clubs or Organizations: Not on file   Attends Archivist  Meetings: Not on file   Marital Status: Not on file  Intimate Partner Violence:    Fear of Current or Ex-Partner: Not on file   Emotionally Abused: Not on file   Physically Abused: Not on file   Sexually Abused: Not on file     ROS- All systems are reviewed and negative except as per the HPI above.  Physical Exam: Vitals:   06/26/20 1055  BP: (!) 146/80  Pulse: (!) 58  Weight: 90.2 kg  Height: 6' (1.829 m)    GEN- The patient is well appearing, alert and oriented x 3 today.   Head- normocephalic, atraumatic Eyes-  Sclera clear, conjunctiva pink Ears- hearing intact Oropharynx- clear Neck- supple  Lungs- Clear to ausculation bilaterally, normal work of breathing Heart- Regular rate and rhythm, no rubs or gallops, 2/6 systolic murmur GI- soft, NT, ND, + BS Extremities- no clubbing, cyanosis, or edema MS- no significant deformity or atrophy Skin- no rash or lesion Psych- euthymic mood, full affect Neuro- strength and sensation are intact  Wt Readings from Last 3 Encounters:  06/26/20 90.2 kg  06/11/20 87.5 kg  06/04/20 90.3 kg    EKG today demonstrates SB HR 58, PVC, LAFB, RBBB, PR 174, QRS 150, QTc 508  Echo 12/13/19 demonstrated  1. Left ventricular ejection fraction, by estimation, is 55%. The left  ventricle has normal function. The left ventricle demonstrates regional  wall motion abnormalities, basal inferior akinesis. There is mild left  ventricular hypertrophy. Left  ventricular diastolic parameters are consistent with Grade II diastolic  dysfunction (pseudonormalization).  2. Right ventricular systolic function is normal. The right ventricular  size is mildly enlarged. There is moderately elevated pulmonary artery  systolic pressure. The estimated right ventricular systolic pressure is  43.3 mmHg.  3. The mitral valve is normal in structure. Trivial mitral valve  regurgitation. No evidence of mitral stenosis.  4. Status post aortic root replacement  (Bentall). Aortic root/ascending  aorta has been repaired/replaced.  5. Status post aortic valve replacment with bioprosthetic aortic valve.  No significant regurgitation. Aortic valve area, by VTI measures 1.34 cm.  Aortic valve mean gradient measures 17.0 mmHg.  6. Right atrial size was moderately dilated.  7. Left atrial size was moderately dilated.  8. The inferior vena cava is normal in size with greater than 50%  respiratory variability, suggesting right atrial pressure of 3 mmHg.   Epic records are reviewed at length today  CHA2DS2-VASc Score = 3  The patient's score is based upon: CHF History: 0 HTN History: 1 Diabetes History: 0 Stroke History: 0 Vascular Disease History: 1 Age Score: 1 Gender Score: 0      ASSESSMENT AND PLAN: 1. Persistent Atrial Fibrillation/atrial tachycardia The patient's CHA2DS2-VASc score is 3, indicating a 3.2% annual risk of stroke.   S/p DCCV on 11/4/2 Patient back in SR. Continue flecainide 50 mg BID. Would not titrate up given LAFB and RBBB, watch intervals closely.  Continue Lopressor 25 mg BID Continue Eliquis 5 mg BID  2. Secondary Hypercoagulable State (ICD10:  D68.69) The patient is at significant risk for stroke/thromboembolism based upon his CHA2DS2-VASc Score of 3.  Continue Apixaban (Eliquis).   3. Obstructive sleep apnea The importance of adequate treatment of sleep apnea was discussed today in order to improve our ability to maintain sinus rhythm long term. Patient reports compliance with CPAP therapy.  4. Chronic  diastolic CHF No signs or symptoms of fluid overload.  5. HTN Stable, no changes today.   Follow up with Dr Aundra Dubin as scheduled and Dr Caryl Comes per recall. AF clinic in 6 months.    [facility] [address] [place], [place] [address] [phone] 06/26/2020 11:06 AM

## 2020-06-27 DIAGNOSIS — Z23 Encounter for immunization: Secondary | ICD-10-CM | POA: Diagnosis not present

## 2020-07-23 ENCOUNTER — Other Ambulatory Visit (HOSPITAL_COMMUNITY): Payer: Self-pay | Admitting: Cardiology

## 2020-07-23 MED ORDER — APIXABAN 5 MG PO TABS
5.0000 mg | ORAL_TABLET | Freq: Two times a day (BID) | ORAL | 2 refills | Status: DC
Start: 2020-07-23 — End: 2020-12-26

## 2020-07-24 ENCOUNTER — Telehealth (HOSPITAL_COMMUNITY): Payer: Self-pay | Admitting: Pharmacy Technician

## 2020-07-24 NOTE — Telephone Encounter (Signed)
Patient Advocate Encounter   Received notification from [employer] that prior authorization for Eliquis is required.   PA submitted via fax [phone] Status is pending   Will continue to follow.

## 2020-08-15 ENCOUNTER — Other Ambulatory Visit: Payer: Self-pay

## 2020-08-15 ENCOUNTER — Ambulatory Visit (HOSPITAL_COMMUNITY)
Admission: RE | Admit: 2020-08-15 | Discharge: 2020-08-15 | Disposition: A | Discharge status: Home / Self Care | Payer: Medicare Other | Source: Ambulatory Visit | Attending: Cardiology | Admitting: Cardiology

## 2020-08-15 ENCOUNTER — Telehealth (HOSPITAL_COMMUNITY): Payer: Self-pay

## 2020-08-15 ENCOUNTER — Encounter (HOSPITAL_COMMUNITY): Payer: Self-pay | Admitting: Cardiology

## 2020-08-15 VITALS — BP 118/70 | HR 65 | Wt 199.4 lb

## 2020-08-15 DIAGNOSIS — Z953 Presence of xenogenic heart valve: Secondary | ICD-10-CM | POA: Diagnosis not present

## 2020-08-15 DIAGNOSIS — Z87891 Personal history of nicotine dependence: Secondary | ICD-10-CM | POA: Insufficient documentation

## 2020-08-15 DIAGNOSIS — I110 Hypertensive heart disease with heart failure: Secondary | ICD-10-CM | POA: Diagnosis not present

## 2020-08-15 DIAGNOSIS — I5022 Chronic systolic (congestive) heart failure: Secondary | ICD-10-CM

## 2020-08-15 DIAGNOSIS — Z7901 Long term (current) use of anticoagulants: Secondary | ICD-10-CM | POA: Diagnosis not present

## 2020-08-15 DIAGNOSIS — Z79899 Other long term (current) drug therapy: Secondary | ICD-10-CM | POA: Insufficient documentation

## 2020-08-15 DIAGNOSIS — I4510 Unspecified right bundle-branch block: Secondary | ICD-10-CM | POA: Insufficient documentation

## 2020-08-15 DIAGNOSIS — G4733 Obstructive sleep apnea (adult) (pediatric): Secondary | ICD-10-CM | POA: Insufficient documentation

## 2020-08-15 DIAGNOSIS — I5032 Chronic diastolic (congestive) heart failure: Secondary | ICD-10-CM | POA: Diagnosis not present

## 2020-08-15 DIAGNOSIS — Z7984 Long term (current) use of oral hypoglycemic drugs: Secondary | ICD-10-CM | POA: Diagnosis not present

## 2020-08-15 DIAGNOSIS — I480 Paroxysmal atrial fibrillation: Secondary | ICD-10-CM | POA: Diagnosis not present

## 2020-08-15 DIAGNOSIS — J449 Chronic obstructive pulmonary disease, unspecified: Secondary | ICD-10-CM | POA: Insufficient documentation

## 2020-08-15 DIAGNOSIS — E871 Hypo-osmolality and hyponatremia: Secondary | ICD-10-CM | POA: Diagnosis not present

## 2020-08-15 HISTORY — DX: Heart failure, unspecified: I50.9

## 2020-08-15 LAB — BASIC METABOLIC PANEL
Anion gap: 13 (ref 5–15)
BUN: 20 mg/dL (ref 8–23)
CO2: 25 mmol/L (ref 22–32)
Calcium: 9.1 mg/dL (ref 8.9–10.3)
Chloride: 90 mmol/L — ABNORMAL LOW (ref 98–111)
Creatinine, Ser: 1.86 mg/dL — ABNORMAL HIGH (ref 0.61–1.24)
GFR, Estimated: 38 mL/min — ABNORMAL LOW (ref >60)
Glucose, Bld: 79 mg/dL (ref 70–99)
Potassium: 4.4 mmol/L (ref 3.5–5.1)
Sodium: 128 mmol/L — ABNORMAL LOW (ref 135–145)

## 2020-08-15 MED ORDER — EMPAGLIFLOZIN 10 MG PO TABS: 10.0000 mg | ORAL_TABLET | Freq: Every day | ORAL | 3 refills | Status: DC

## 2020-08-15 MED ORDER — EMPAGLIFLOZIN 10 MG PO TABS
10.0000 mg | ORAL_TABLET | Freq: Every day | ORAL | 3 refills | Status: DC
Start: 2020-08-15 — End: 2020-11-15

## 2020-08-15 MED ORDER — EMPAGLIFLOZIN 10 MG PO TABS
10.0000 mg | ORAL_TABLET | Freq: Every day | ORAL | 0 refills | Status: DC
Start: 2020-08-15 — End: 2020-08-15

## 2020-08-15 NOTE — Patient Instructions (Addendum)
START Jardiance 10mg  (1 tablet) daily  An order for a portable oxygen tank has been faxed to . They will contact you.  Labs done today, your results will be available in MyChart, we will contact you for abnormal readings.  Your physician recommends that you schedule repeat labs in 10 days  Your physician recommends that you schedule a follow-up appointment in: 3 months  If you have any questions or concerns before your next appointment please send [place] a message through [place] or call our office at [phone].    TO LEAVE A MESSAGE FOR THE NURSE SELECT OPTION 2, PLEASE LEAVE A MESSAGE INCLUDING: . YOUR NAME . DATE OF BIRTH . CALL BACK NUMBER . REASON FOR CALL**this is important as we prioritize the call backs  YOU WILL RECEIVE A CALL BACK THE SAME DAY AS LONG AS YOU CALL BEFORE 4:00 PM

## 2020-08-15 NOTE — Progress Notes (Signed)
Order for portable concentrator was faxed to [facility] at [phone]

## 2020-08-15 NOTE — Telephone Encounter (Signed)
Samara Snide, RN  08/15/2020 3:37 PM EST Back to Top     Patient's wife advised and verbalized understanding

## 2020-08-15 NOTE — Telephone Encounter (Signed)
-----   Message from Laurey Morale, MD sent at 08/15/2020  1:48 PM EST ----- Cut back on po fluid intake with low sodium

## 2020-08-15 NOTE — Progress Notes (Signed)
PCP: Colon Branch, MD Cardiology: Dr. Caryl Comes HF Cardiology: Dr. Aundra Dubin  70 y.o. with history of COPD, paroxysmal atrial fibrillation, type A aortic dissection, and chronic diastolic CHF was referred by Dr. Caryl Comes for evaluation of CHF.  Prior to 2018, patient reports no significant limitations.  In 8/18, he had a Type A aortic dissection.  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  The dissection extended down to the abdominal aorta.  He was a heavy smoker, quit in 8/18.  He developed paroxysmal atrial fibrillation, had DCCV in 10/19.  He is on Eliquis and flecainide now.  Last echo in 2/20 showed EF 55%, mildly decreased RV systolic function, normally functioning bioprosthetic aortic valve.  PFTs in 6/19 showed moderate-severe obstruction.    Ever since his operation in 2018, Sean Riley has had significant exertional dyspnea and has been quite limited.    He had RHC in 11/20, showing normal filling pressures, preserved cardiac output, and mild primarily pulmonary venous hypertension.    PYP scan was not suggestive of cardiac amyloidosis.   He saw pulmonary, PFTs showed moderate to severe obstruction.  He is using oxygen with exertion now, which he says helps. He is using CPAP.   Echo in 5/21 showed EF 55%, basal inferior akinesis, mild RV dilation with normal function, PASP 46 mmHg, s/p Bentall, s/p bioprosthetic aortic valve with mean gradient 16 mmHg.   11/21 had DCCV to NSR.  Had been in atrial fibrillation for a few weeks.   He returns for followup of CHF. Weight is up 1 lb.  He is still in NSR after DCCV, feels better in NSR.  Saw pulmonary and using nebulizer 4 times/day, could not tolerate Trelegy due to sore throat.  Stable dyspnea with moderate exertion.  Hard for him to walk more than 50-100 feet.  Has trouble with his large oxygen canister, wants to walk out to his barn and workshop, but his wife has to hold the oxygen for him.   No chest pain.   ECG (personally reviewed): NSR, RBBB, old inferior MI.   Labs (8/20): LDL 74 Labs (10/20): Na 126, K 4.5, creatinine 1.15 Labs (11/20): K 3.8, creatinine 1.3 Labs (12/20): K 3.9, creatinine 1.5, myeloma panel negative Labs (3/21): K 4.1, creatinine 1.6, TSH normal Labs (5/21): BNP 142, creatinine 2 Labs (9/21): K 4.5, creatinine 1.8 Labs (10/21): K 4, creatinine 1.55, BNP 118  PMH: 1. COPD: PFTs (6/19) with moderate-severe obstruction.  Prior heavy smoker, quit 2018.  - Uses oxygen with exertion.  2. Atrial fibrillation: Paroxysmal. DCCV in 10/19, now on flecainide.  - DCCV to NSR in 11/21.  3. HTN 4. Chronic diastolic CHF: Echo ([id number]) with EF 55%, mild LVH, mild LV dilation, mild RV dilation with mildly decreased systolic function, bioprosthetic aortic valve with mean gradient 15 mmHg.  - RHC (11/20): mean RA 9, PA 41/15, mean PCWP 13, CI 2.81, PVR 2.15 WU.   - PYP scan not suggestive of cardiac amyloidosis.  - Echo (5/21): EF 55%, basal inferior akinesis, mild RV dilation with normal function, PASP 46 mmHg, s/p Bentall, s/p bioprosthetic aortic valve with mean gradient 16 mmHg.  5. Type A aortic dissection: 8/18, patient had emergency repair with Bentall/bioprosthetic aortic valve.  Dissection extends down to abdominal aorta.  Coronaries re-implanted. Complicated by pericardial effusion requiring pericardiocentesis.  6. Chronic hyponatremia.  7. OSA: He is using CPAP.  8. ABIs (7/21): Normal  FH: Uncle with aortic dissection, mother with  SCD at age 53.   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  Married, prior heavy smoker but quit in 2018.   ROS: All systems reviewed and negative except as per HPI.   Current Outpatient Medications  Medication Sig Dispense Refill  . albuterol (PROAIR HFA) 108 (90 Base) MCG/ACT inhaler Inhale 2 puffs into the lungs every 4 (four) hours as needed for wheezing or shortness of breath. 54 g 3  . albuterol (PROVENTIL) (2.5 MG/3ML) 0.083%  nebulizer solution Take 3 mLs (2.5 mg total) by nebulization every 8 (eight) hours as needed for wheezing or shortness of breath. 150 mL 1  . ALPRAZolam (XANAX) 1 MG tablet TAKE 1-1&1/2 TABLETS BY MOUTH AT BEDTIME 40 tablet 1  . apixaban (ELIQUIS) 5 MG TABS tablet Take 1 tablet (5 mg total) by mouth 2 (two) times daily. 180 tablet 2  . augmented betamethasone dipropionate (DIPROLENE-AF) 0.05 % cream Apply topically 2 (two) times daily as needed. 50 g 1  . bisacodyl (DULCOLAX) 5 MG EC tablet Take 5 mg by mouth daily as needed for moderate constipation.    . diphenhydrAMINE (BENADRYL) 25 MG tablet Take 25 mg by mouth every 8 (eight) hours as needed (allergies/runny nose).     Marland Kitchen escitalopram (LEXAPRO) 10 MG tablet Take 1 tablet (10 mg total) by mouth daily. 90 tablet 3  . esomeprazole (NEXIUM) 40 MG capsule Take 1 capsule (40 mg total) by mouth daily before breakfast. 90 capsule 3  . flecainide (TAMBOCOR) 50 MG tablet TAKE ONE AND ONE-HALF TABLETS TWICE A DAY 270 tablet 0  . fluticasone (FLONASE) 50 MCG/ACT nasal spray Place 1 spray into both nostrils 2 (two) times daily as needed for allergies. Use one spray in each nostril twice daily.    Marland Kitchen ipratropium-albuterol (DUONEB) 0.5-2.5 (3) MG/3ML SOLN Take 3 mLs by nebulization 4 (four) times daily. 360 mL 5  . KLOR-CON M20 20 MEQ tablet TAKE 2 TABLETS (40 MEQ) DAILY 180 tablet 3  . losartan (COZAAR) 25 MG tablet Take 1 tablet (25 mg total) by mouth daily. 90 tablet 1  . metoprolol tartrate (LOPRESSOR) 25 MG tablet Take 1 tablet (25 mg total) by mouth 2 (two) times daily. Please make yearly appt with Dr. Graciela Husbands for January 2022 for future refills. Thank you 1st attempt 180 tablet 0  . OXYGEN Inhale into the lungs. 2 liters as  needed    . rosuvastatin (CRESTOR) 5 MG tablet Take 1 tablet (5 mg total) by mouth daily. 90 tablet 1  . torsemide (DEMADEX) 20 MG tablet Take 40 mg (2 tabs) every other day ALTERNATING with 60 mg (3 tabs) every other day 270 tablet 3   . empagliflozin (JARDIANCE) 10 MG TABS tablet Take 1 tablet (10 mg total) by mouth daily before breakfast. 90 tablet 3   No current facility-administered medications for this encounter.   BP 118/70   Pulse 65   Wt 90.4 kg (199 lb 6.4 oz)   SpO2 90%   BMI 27.04 kg/m  General: NAD Neck: JVP 8 cm, no thyromegaly or thyroid nodule.  Lungs: Clear to auscultation bilaterally with normal respiratory effort. CV: Nondisplaced PMI.  Heart regular S1/S2, no S3/S4, 3/6 SEM RUSB with clear S2.  Trace ankle edema.  No carotid bruit.  Normal pedal pulses.  Abdomen: Soft, nontender, no hepatosplenomegaly, no distention.  Skin: Intact without lesions or rashes.  Neurologic: Alert and oriented x 3.  Psych: Normal affect. Extremities: No clubbing or cyanosis.  HEENT: Normal.  Assessment/Plan: 1. Atrial fibrillation: Paroxysmal.  Back in NSR after DCCV in 11/21.  Feels better in NSR.     - Continue Eliquis 5 mg bid.  - Continue flecainide + metoprolol.  2. Chronic diastolic CHF: Echo in 2/20 with EF 55%, mildly decreased RV systolic function, normally functioning bioprosthetic aortic valve. RHC in 11/20 after increasing torsemide showed near-normal filling pressures.  PYP study was not suggestive of ATTR amyloidosis and myeloma panel was negative.  Echo in 5/21 showed EF 55%, mild RV dilation/normal function, bioprosthetic aortic valve mean gradient 16 mmHg.  He is improved but symptoms are still NYHA class III.  I suspect that COPD also contributes to his exercise intolerance. On exam, he does not look volume overloaded.  Atrial fibrillation worsens his HF, he is in NSR today.  - Continue torsemide 60 mg daily alternating with 40 mg daily.   - Start Jardiance 10 mg daily for diastolic CHF, BMET today and in 10 days.  - Would be good Cardiomems candidate except he has not been hospitalized in the last year.  3. COPD: Moderate to severe obstruction on 6/19 PFTs, prior smoker.  As above, I suspect this  contributes to his dyspnea.  - Continue followup with pulmonary.  - Will try to get him a smaller portable oxygen tank, he needs this for quality of life.  4. TFT:DDUKGURK CPAP.    5. Aortic dissection: S/p Type A aortic dissection, s/p Bentall with bioprosthetic aortic valve. Echo today showed stable bioprosthetic aortic valve.  Dr. Myra Gianotti has been following his residual dissected aorta.   - Given family history of aortic disease (uncle with aortic dissection, mother with SCD at age 25 - ?from dissection), his children's aortas need to be screened due to risk for inherited connective tissue disorder.  We have discussed this.  He is not Marfanoid.   6. Hyponatremia: Cut back on po fluid intake.   Followup 3 months.   Marca Ancona 08/15/2020

## 2020-08-16 ENCOUNTER — Telehealth (HOSPITAL_COMMUNITY): Payer: Self-pay | Admitting: Pharmacist

## 2020-08-16 NOTE — Telephone Encounter (Signed)
Advanced Heart Failure Patient Advocate Encounter  Prior Authorization for Sean Riley has been approved.    PA# [id number] Effective dates: 07/17/2020 - 08/16/2021  Audry Riles, PharmD, BCPS, BCCP, CPP Heart Failure Clinic Pharmacist [phone]

## 2020-08-22 ENCOUNTER — Telehealth (HOSPITAL_COMMUNITY): Payer: Self-pay | Admitting: Vascular Surgery

## 2020-08-22 NOTE — Telephone Encounter (Signed)
Left pt message to reschedule lab appt 08/26/20 to another day due to bad weather

## 2020-08-23 ENCOUNTER — Other Ambulatory Visit: Payer: Self-pay

## 2020-08-23 ENCOUNTER — Encounter: Payer: Self-pay | Admitting: Internal Medicine

## 2020-08-23 ENCOUNTER — Ambulatory Visit (INDEPENDENT_AMBULATORY_CARE_PROVIDER_SITE_OTHER): Payer: Medicare Other | Admitting: Internal Medicine

## 2020-08-23 VITALS — BP 146/62 | HR 57 | Ht 72.0 in | Wt 200.8 lb

## 2020-08-23 DIAGNOSIS — I471 Supraventricular tachycardia: Secondary | ICD-10-CM

## 2020-08-23 DIAGNOSIS — I4891 Unspecified atrial fibrillation: Secondary | ICD-10-CM

## 2020-08-23 MED ORDER — FLECAINIDE ACETATE 50 MG PO TABS
ORAL_TABLET | ORAL | 1 refills | Status: DC
Start: 2020-08-23 — End: 2020-11-29

## 2020-08-23 MED ORDER — METOPROLOL TARTRATE 25 MG PO TABS
25.0000 mg | ORAL_TABLET | Freq: Two times a day (BID) | ORAL | 1 refills | Status: DC
Start: 2020-08-23 — End: 2020-09-08

## 2020-08-23 NOTE — Progress Notes (Signed)
Patient Care Team: Colon Branch, MD as PCP - General (Internal Medicine) Sueanne Margarita, MD as PCP - Sleep Medicine (Cardiology) Deboraha Sprang, MD as Consulting Physician (Cardiology) Darleen Crocker, MD as Consulting Physician (Ophthalmology) Loletha Carrow Kirke Corin, MD as Consulting Physician (Gastroenterology) Neldon Mc Donnamarie Poag, MD as Consulting Physician (Allergy and Immunology) Rexene Alberts, MD as Consulting Physician (Cardiothoracic Surgery) Larey Dresser, MD as Consulting Physician (Cardiology)   HPI  Sean Riley is a 70 y.o. male Seen With Chief Complaint of  atrial fibrillation.  He remains on flecainide and is anticoagulated with apixaban with concomitant aspirin.   Hospitalized 8/18 for aortic dissection w root replacement, prosthetic AVR and reconstruction of entire ascending aorta cx by pericardial effusion requiring pericardiocentesis and some R hand injury.  Subsequently developed worsening ascending aortic dilatation and underwent endovascular repair 8/19+  Interval cardioversion.  Noted by pulse ox with heart rates in the 100s.  Associated with increased fatigue and increasing dyspnea.  Significant fatigue and ongoing limitations of activity.  Mostly sedentary.  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  DATE TEST EF   2013/  MYOVIEW   58 % Abnormal  2013 [facility]    % Normal CAs  12/16 MYOVIEW 48%   9/18 Echo  50-55% Bioprosthetic AVR  2/20 Echo  55% LVH  5/21 Echo  55% Basilar AK     Date PR QRS Flecainide  5/12  112   5/15  116 100  5/16  132 100  6/17  132 100  12/17  136 100  9/18 166 144 100  1/19 180 146 100  5/19 176 156 100  9/19`  170 100  10/19 174 162 100  12/19 172 156 100  1/21 210  160   1/22 220 130      Date Cr K Hgb  1/22 1.86 4.4 85.2     Thromboembolic risk factors ( age -37, HTN-1, Vasc disease -1) for a CHADSVASc Score of 3    Past Medical History:  Diagnosis Date  . Allergy   . Arthritis   . Ascending  aortic dissection ([place]) 03/23/2017  . Atrial fibrillation ([place])   . Cataract    removed both eyes   . CHF (congestive heart failure) ([place])   . GERD (gastroesophageal reflux disease)   . HYPERLIPIDEMIA   . 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 Hypertension   . Microscopic hematuria    negative cystoscopy  . NONSPECIFIC ABNORMAL ELECTROCARDIOGRAM   . Pleural effusion on right   . Pleural effusion, bilateral   . S/P aortic dissection repair 03/24/2017   Biological Bentall aortic root replacement + resection and grafting of entire ascending aorta, transverse aortic arch and proximal descending thoracic aorta with elephant trunk distal anastomosis and debranching of aortic arch vessels  . 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 Varicose veins     Past Surgical History:  Procedure Laterality Date  . APPENDECTOMY    . CARDIAC CATHETERIZATION      X3;Dr Caryl Comes, last 01/20/12: mild non-obstructive CAD, normal LV systolic function  . CARDIOVERSION N/A 05/31/2018   Procedure: CARDIOVERSION;  Surgeon: Sueanne Margarita, MD;  Location: [facility] ENDOSCOPY;  Service: Cardiovascular;  Laterality: N/A;  .  CARDIOVERSION N/A 06/13/2020   Procedure: CARDIOVERSION;  Surgeon: Larey Dresser, MD;  Location: [facility] ENDOSCOPY;  Service: Cardiovascular;  Laterality: N/A;  . COLONOSCOPY    . CYSTOSCOPY  1978   Dr Hartley Barefoot  . EYE SURGERY    . IR THORACENTESIS ASP PLEURAL SPACE W/IMG GUIDE  11/25/2017  . KNEE ARTHROSCOPY  2012   Dr Theda Sers  . LUMBAR LAMINECTOMY  1987   Dr Durward Fortes  . MYELOGRAM  2007  . PERICARDIOCENTESIS N/A 04/07/2017   Procedure: PERICARDIOCENTESIS;  Surgeon: Sherren Mocha, MD;  Location: [place] CV LAB;  Service: Cardiovascular;  Laterality: N/A;  . POLYPECTOMY    . REPAIR OF ACUTE  ASCENDING THORACIC AORTIC DISSECTION N/A 03/23/2017   Procedure: REPAIR OF ACUTE ASCENDING THORACIC AORTIC DISSECTION.  Bentall procedure.  Aortic root repleacement with bioprosthetic valve.  Reimplantation of left and right coronary arteries.  Total resection of transverse aortic arch.  Elephant trunk distal anastomosis and debranching of arch vessels.;  Surgeon: Rexene Alberts, MD;  Location: [place];  Service: Vascular;  Laterality: N/A;  . RIGHT HEART CATH N/A 06/15/2019   Procedure: RIGHT HEART CATH;  Surgeon: Larey Dresser, MD;  Location: [place] CV LAB;  Service: Cardiovascular;  Laterality: N/A;  . ROTATOR CUFF REPAIR    . SHOULDER ARTHROSCOPY  2011   Dr Theda Sers  . SHOULDER ARTHROSCOPY Right 08/2015  . SPINAL FUSION  1986   Dr Rolin Barry  . TEE WITHOUT CARDIOVERSION N/A 03/23/2017   Procedure: TRANSESOPHAGEAL ECHOCARDIOGRAM (TEE);  Surgeon: Rexene Alberts, MD;  Location: [place];  Service: Open Heart Surgery;  Laterality: N/A;  . THORACIC AORTIC ENDOVASCULAR STENT GRAFT N/A 03/30/2018   Procedure: THORACIC AORTIC ENDOVASCULAR STENT GRAFT WITH INTRAVASCULAR ULTRASOUND;  Surgeon: Serafina Mitchell, MD;  Location: [facility] OR;  Service: Vascular;  Laterality: N/A;  . TRACHEOSTOMY     age 42 for croup    Current Outpatient Medications  Medication Sig Dispense Refill  . albuterol (PROAIR HFA) 108 (90 Base) MCG/ACT inhaler Inhale 2 puffs into the lungs every 4 (four) hours as needed for wheezing or shortness of breath. 54 g 3  . albuterol (PROVENTIL) (2.5 MG/3ML) 0.083% nebulizer solution Take 3 mLs (2.5 mg total) by nebulization every 8 (eight) hours as needed for wheezing or shortness of breath. 150 mL 1  . ALPRAZolam (XANAX) 1 MG tablet TAKE 1-1&1/2 TABLETS BY MOUTH AT BEDTIME 40 tablet 1  . apixaban (ELIQUIS) 5 MG TABS tablet Take 1 tablet (5 mg total) by mouth 2 (two) times daily. 180 tablet 2  . augmented betamethasone dipropionate (DIPROLENE-AF) 0.05 % cream Apply topically 2 (two) times  daily as needed. 50 g 1  . bisacodyl (DULCOLAX) 5 MG EC tablet Take 5 mg by mouth daily as needed for moderate constipation.    . diphenhydrAMINE (BENADRYL) 25 MG tablet Take 25 mg by mouth every 8 (eight) hours as needed (allergies/runny nose).     Marland Kitchen empagliflozin (JARDIANCE) 10 MG TABS tablet Take 1 tablet (10 mg total) by mouth daily before breakfast. 90 tablet 3  . escitalopram (LEXAPRO) 10 MG tablet Take 1 tablet (10 mg total) by mouth daily. 90 tablet 3  . esomeprazole (NEXIUM) 40 MG capsule Take 1 capsule (40 mg total) by mouth daily before breakfast. 90 capsule 3  . flecainide (TAMBOCOR) 50 MG tablet TAKE ONE AND ONE-HALF TABLETS TWICE A DAY 270 tablet 0  . fluticasone (FLONASE) 50 MCG/ACT nasal spray Place 1 spray into both nostrils 2 (two)  times daily as needed for allergies. Use one spray in each nostril twice daily.    Marland Kitchen ipratropium-albuterol (DUONEB) 0.5-2.5 (3) MG/3ML SOLN Take 3 mLs by nebulization 4 (four) times daily. 360 mL 5  . KLOR-CON M20 20 MEQ tablet TAKE 2 TABLETS (40 MEQ) DAILY 180 tablet 3  . losartan (COZAAR) 25 MG tablet Take 1 tablet (25 mg total) by mouth daily. 90 tablet 1  . metoprolol tartrate (LOPRESSOR) 25 MG tablet Take 1 tablet (25 mg total) by mouth 2 (two) times daily. Please make yearly appt with Dr. Caryl Comes for January 2022 for future refills. Thank you 1st attempt 180 tablet 0  . OXYGEN Inhale into the lungs. 2 liters as  needed    . rosuvastatin (CRESTOR) 5 MG tablet Take 1 tablet (5 mg total) by mouth daily. 90 tablet 1  . torsemide (DEMADEX) 20 MG tablet Take 40 mg (2 tabs) every other day ALTERNATING with 60 mg (3 tabs) every other day 270 tablet 3   No current facility-administered medications for this visit.    Allergies  Allergen Reactions  . Simvastatin Other (See Comments)    Mental status changes  . Celecoxib Other (See Comments)    GI UPSET AND INFLAMMATION  . Codeine Other (See Comments)    GI UPSET AND INFLAMMATION  . Nsaids Other (See  Comments)    GI UPSET AND INFLAMMATION (can tolerate via IV)  . Tape Other (See Comments)    Medical tape and Band-Aids PULL OFF THE SKIN; please use Coban wrap  . Cephalexin Itching and Rash  . Latex Rash    Review of Systems negative except from HPI and PMH  Physical Exam BP (!) 146/62   Pulse (!) 57   Ht 6' (1.829 m)   Wt 200 lb 12.8 oz (91.1 kg)   SpO2 91%   BMI 27.23 kg/m  Well developed and nourished in no acute distress HENT normal Neck supple   Clear Regular rate and rhythm loud split S2 with a 3/6 systolic murmur at the right upper sternal border  abd-soft with active BS No Clubbing cyanosis edema Skin-warm and dry A & Oriented  Grossly normal sensory and motor function  ECG sinus at 57   Assessment and Plan  Atrial tachycardia and fibrillation  Hypertension  HFpEF  Aortic dissection status post reconstruction   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  Status post cardioversion.  Continue current dose of flecainide, increased dose precluded by previous widening of the QRS/PR  Blood pressure is reasonably controlled. At home 120s    Encouraged exercise.  Came up with some strategies.  Euvolemic continue current meds

## 2020-08-23 NOTE — Patient Instructions (Signed)

## 2020-08-26 ENCOUNTER — Other Ambulatory Visit (HOSPITAL_COMMUNITY): Payer: Medicare Other

## 2020-08-28 ENCOUNTER — Telehealth (HOSPITAL_COMMUNITY): Payer: Self-pay | Admitting: Cardiology

## 2020-08-28 NOTE — Telephone Encounter (Signed)
Called pt to get more information. No answer. Will try patient again tomorrow.

## 2020-08-28 NOTE — Telephone Encounter (Signed)
Pt stated that his oxygen level has dropped, pulse is low, concern if it's caused by the new medication, Jardiance started on Sunday, please advise

## 2020-09-05 ENCOUNTER — Other Ambulatory Visit (HOSPITAL_COMMUNITY): Payer: Medicare Other

## 2020-09-05 ENCOUNTER — Other Ambulatory Visit: Payer: Self-pay

## 2020-09-05 ENCOUNTER — Inpatient Hospital Stay (HOSPITAL_COMMUNITY)
Admission: EM | Admit: 2020-09-05 | Discharge: 2020-09-08 | DRG: 291 | Disposition: A | Discharge status: Home Health | Payer: Medicare Other | Attending: Internal Medicine | Admitting: Internal Medicine

## 2020-09-05 ENCOUNTER — Emergency Department (HOSPITAL_COMMUNITY): Payer: Medicare Other

## 2020-09-05 DIAGNOSIS — Z886 Allergy status to analgesic agent status: Secondary | ICD-10-CM

## 2020-09-05 DIAGNOSIS — I110 Hypertensive heart disease with heart failure: Secondary | ICD-10-CM | POA: Diagnosis not present

## 2020-09-05 DIAGNOSIS — R7302 Impaired glucose tolerance (oral): Secondary | ICD-10-CM | POA: Diagnosis present

## 2020-09-05 DIAGNOSIS — Z91040 Latex allergy status: Secondary | ICD-10-CM

## 2020-09-05 DIAGNOSIS — J80 Acute respiratory distress syndrome: Secondary | ICD-10-CM | POA: Diagnosis not present

## 2020-09-05 DIAGNOSIS — Z7901 Long term (current) use of anticoagulants: Secondary | ICD-10-CM

## 2020-09-05 DIAGNOSIS — I517 Cardiomegaly: Secondary | ICD-10-CM | POA: Diagnosis not present

## 2020-09-05 DIAGNOSIS — E7439 Other disorders of intestinal carbohydrate absorption: Secondary | ICD-10-CM | POA: Diagnosis present

## 2020-09-05 DIAGNOSIS — J441 Chronic obstructive pulmonary disease with (acute) exacerbation: Secondary | ICD-10-CM | POA: Diagnosis not present

## 2020-09-05 DIAGNOSIS — N1832 Chronic kidney disease, stage 3b: Secondary | ICD-10-CM | POA: Diagnosis present

## 2020-09-05 DIAGNOSIS — F419 Anxiety disorder, unspecified: Secondary | ICD-10-CM | POA: Diagnosis present

## 2020-09-05 DIAGNOSIS — J9621 Acute and chronic respiratory failure with hypoxia: Secondary | ICD-10-CM | POA: Diagnosis not present

## 2020-09-05 DIAGNOSIS — I5033 Acute on chronic diastolic (congestive) heart failure: Secondary | ICD-10-CM | POA: Diagnosis not present

## 2020-09-05 DIAGNOSIS — R410 Disorientation, unspecified: Secondary | ICD-10-CM | POA: Diagnosis not present

## 2020-09-05 DIAGNOSIS — R0602 Shortness of breath: Secondary | ICD-10-CM | POA: Diagnosis not present

## 2020-09-05 DIAGNOSIS — Z881 Allergy status to other antibiotic agents status: Secondary | ICD-10-CM

## 2020-09-05 DIAGNOSIS — Z87891 Personal history of nicotine dependence: Secondary | ICD-10-CM

## 2020-09-05 DIAGNOSIS — E785 Hyperlipidemia, unspecified: Secondary | ICD-10-CM | POA: Diagnosis present

## 2020-09-05 DIAGNOSIS — Z20822 Contact with and (suspected) exposure to covid-19: Secondary | ICD-10-CM | POA: Diagnosis not present

## 2020-09-05 DIAGNOSIS — Z8249 Family history of ischemic heart disease and other diseases of the circulatory system: Secondary | ICD-10-CM

## 2020-09-05 DIAGNOSIS — I130 Hypertensive heart and chronic kidney disease with heart failure and stage 1 through stage 4 chronic kidney disease, or unspecified chronic kidney disease: Principal | ICD-10-CM | POA: Diagnosis present

## 2020-09-05 DIAGNOSIS — Z9981 Dependence on supplemental oxygen: Secondary | ICD-10-CM

## 2020-09-05 DIAGNOSIS — J811 Chronic pulmonary edema: Secondary | ICD-10-CM | POA: Diagnosis not present

## 2020-09-05 DIAGNOSIS — G4733 Obstructive sleep apnea (adult) (pediatric): Secondary | ICD-10-CM | POA: Diagnosis present

## 2020-09-05 DIAGNOSIS — Z803 Family history of malignant neoplasm of breast: Secondary | ICD-10-CM

## 2020-09-05 DIAGNOSIS — A419 Sepsis, unspecified organism: Secondary | ICD-10-CM

## 2020-09-05 DIAGNOSIS — N400 Enlarged prostate without lower urinary tract symptoms: Secondary | ICD-10-CM | POA: Diagnosis present

## 2020-09-05 DIAGNOSIS — Z888 Allergy status to other drugs, medicaments and biological substances status: Secondary | ICD-10-CM

## 2020-09-05 DIAGNOSIS — Z7984 Long term (current) use of oral hypoglycemic drugs: Secondary | ICD-10-CM

## 2020-09-05 DIAGNOSIS — I248 Other forms of acute ischemic heart disease: Secondary | ICD-10-CM | POA: Diagnosis present

## 2020-09-05 DIAGNOSIS — J90 Pleural effusion, not elsewhere classified: Secondary | ICD-10-CM | POA: Diagnosis not present

## 2020-09-05 DIAGNOSIS — I4510 Unspecified right bundle-branch block: Secondary | ICD-10-CM | POA: Diagnosis not present

## 2020-09-05 DIAGNOSIS — R404 Transient alteration of awareness: Secondary | ICD-10-CM | POA: Diagnosis not present

## 2020-09-05 DIAGNOSIS — I4821 Permanent atrial fibrillation: Secondary | ICD-10-CM | POA: Diagnosis present

## 2020-09-05 DIAGNOSIS — J9601 Acute respiratory failure with hypoxia: Secondary | ICD-10-CM | POA: Diagnosis present

## 2020-09-05 DIAGNOSIS — Z79899 Other long term (current) drug therapy: Secondary | ICD-10-CM

## 2020-09-05 DIAGNOSIS — F32A Depression, unspecified: Secondary | ICD-10-CM | POA: Diagnosis present

## 2020-09-05 DIAGNOSIS — K219 Gastro-esophageal reflux disease without esophagitis: Secondary | ICD-10-CM | POA: Diagnosis present

## 2020-09-05 DIAGNOSIS — E871 Hypo-osmolality and hyponatremia: Secondary | ICD-10-CM | POA: Diagnosis present

## 2020-09-05 DIAGNOSIS — D72829 Elevated white blood cell count, unspecified: Secondary | ICD-10-CM | POA: Diagnosis present

## 2020-09-05 LAB — CBC WITH DIFFERENTIAL/PLATELET
Abs Immature Granulocytes: 0.06 10*3/uL (ref 0.00–0.07)
Basophils Absolute: 0 10*3/uL (ref 0.0–0.1)
Basophils Relative: 0 %
Eosinophils Absolute: 0.2 10*3/uL (ref 0.0–0.5)
Eosinophils Relative: 1 %
HCT: 41.4 % (ref 39.0–52.0)
Hemoglobin: 13.4 g/dL (ref 13.0–17.0)
Immature Granulocytes: 1 %
Lymphocytes Relative: 3 %
Lymphs Abs: 0.3 10*3/uL — ABNORMAL LOW (ref 0.7–4.0)
MCH: 30.9 pg (ref 26.0–34.0)
MCHC: 32.4 g/dL (ref 30.0–36.0)
MCV: 95.4 fL (ref 80.0–100.0)
Monocytes Absolute: 1 10*3/uL (ref 0.1–1.0)
Monocytes Relative: 8 %
Neutro Abs: 10.4 10*3/uL — ABNORMAL HIGH (ref 1.7–7.7)
Neutrophils Relative %: 87 %
Platelets: 186 10*3/uL (ref 150–400)
RBC: 4.34 MIL/uL (ref 4.22–5.81)
RDW: 14.9 % (ref 11.5–15.5)
WBC: 12 10*3/uL — ABNORMAL HIGH (ref 4.0–10.5)
nRBC: 0 % (ref 0.0–0.2)

## 2020-09-05 LAB — I-STAT VENOUS BLOOD GAS, ED
Acid-Base Excess: 5 mmol/L — ABNORMAL HIGH (ref 0.0–2.0)
Bicarbonate: 29.7 mmol/L — ABNORMAL HIGH (ref 20.0–28.0)
Calcium, Ion: 1.09 mmol/L — ABNORMAL LOW (ref 1.15–1.40)
HCT: 41 % (ref 39.0–52.0)
Hemoglobin: 13.9 g/dL (ref 13.0–17.0)
O2 Saturation: 97 %
Potassium: 4.5 mmol/L (ref 3.5–5.1)
Sodium: 133 mmol/L — ABNORMAL LOW (ref 135–145)
TCO2: 31 mmol/L (ref 22–32)
pCO2, Ven: 44.4 mmHg (ref 44.0–60.0)
pH, Ven: 7.433 — ABNORMAL HIGH (ref 7.250–7.430)
pO2, Ven: 92 mmHg — ABNORMAL HIGH (ref 32.0–45.0)

## 2020-09-05 LAB — I-STAT CHEM 8, ED
BUN: 30 mg/dL — ABNORMAL HIGH (ref 8–23)
Calcium, Ion: 1.13 mmol/L — ABNORMAL LOW (ref 1.15–1.40)
Chloride: 94 mmol/L — ABNORMAL LOW (ref 98–111)
Creatinine, Ser: 1.9 mg/dL — ABNORMAL HIGH (ref 0.61–1.24)
Glucose, Bld: 141 mg/dL — ABNORMAL HIGH (ref 70–99)
HCT: 42 % (ref 39.0–52.0)
Hemoglobin: 14.3 g/dL (ref 13.0–17.0)
Potassium: 4.5 mmol/L (ref 3.5–5.1)
Sodium: 133 mmol/L — ABNORMAL LOW (ref 135–145)
TCO2: 28 mmol/L (ref 22–32)

## 2020-09-05 NOTE — ED Notes (Signed)
pts wife woukd like an update [phone]

## 2020-09-05 NOTE — ED Provider Notes (Signed)
[place] EMERGENCY DEPARTMENT Provider Note  CSN: [id number] Arrival date & time: 09/05/20 2256  Chief Complaint(s) Shortness of Breath  HPI Sean Riley is a 70 y.o. male with a past medical history listed below including COPD and CHF on as needed supplemental oxygen at home who presents by EMS for shortness of breath Onset was gradual and started several days ago. Progressively worsen. Associated with altered mental status. Per EMS patient felt hot to touch but tactile thermometer did not reveal fever. He was given 1000 mg of Tylenol anyway.  Currently patient is oriented to self and place, not time. Reports feeling improved shortness of breath. Denied any associated chest pain. No cough or congestion. No nausea or vomiting. No abdominal pain. No urinary symptoms.  No known sick contacts.  Will reach out to wife for additional information.  Remainder of history, ROS, and physical exam limited due to patient's condition (AMS). Additional information was obtained from EMS and wife.   Level V Caveat.  2-3 days of confusion and increased WOB with increased need of supplemental oxygen. Noted saturations of 80-90% for 2 days, then today noted them in 70% prompting the call to EMS.  HPI  Past Medical History Past Medical History:  Diagnosis Date  . Allergy   . Arthritis   . Ascending aortic dissection ([place]) 03/23/2017  . Atrial fibrillation ([place])   . Cataract    removed both eyes   . CHF (congestive heart failure) ([place])   . GERD (gastroesophageal reflux disease)   . HYPERLIPIDEMIA   . 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 Hypertension   . Microscopic hematuria    negative cystoscopy  . NONSPECIFIC ABNORMAL ELECTROCARDIOGRAM   . Pleural effusion on right   . Pleural effusion, bilateral   . S/P aortic dissection repair 03/24/2017   Biological Bentall aortic root replacement + resection and grafting of entire ascending aorta,  transverse aortic arch and proximal descending thoracic aorta with elephant trunk distal anastomosis and debranching of aortic arch vessels  . 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 Varicose veins    Patient Active Problem List   Diagnosis Date Noted  . Secondary hypercoagulable state ([place]) 06/26/2020  . Hormonal disorder 10/16/2019  . Insomnia 12/29/2018  . Chronic anemia 12/29/2018  . Atrial tachycardia ([place])   . Thoracic aortic aneurysm without rupture ([place]) 03/30/2018  . COPD (chronic obstructive pulmonary disease) ([place]) 10/06/2017  . Psoriasis 09/14/2017  . Depression 04/18/2017  . Pericardial effusion   . S/P aortic dissection repair 03/24/2017  . 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 Elevated PSA 01/11/2016  . PCP NOTES >>>>>>> 07/24/2015  . Gynecomastia, male 10/10/2014  . Allergic rhinitis 09/10/2014  . Dermatitis 05/11/2014  . Annual physical exam 05/10/2014  . Erectile dysfunction 05/10/2014  . HTN (hypertension) 10/28/2013  . Bronchospasm 10/28/2013  . ETOH abuse 10/28/2013  . Varicose veins of lower extremities with ulcer ([place]) 09/15/2011  . RBBB 11/14/2008  . Hyperlipidemia 03/09/2008  . tobacco use d/o 03/09/2008  . Atrial fibrillation ([place]) 03/09/2008  . GERD 03/09/2008  . Microscopic hematuria 03/09/2008  . BPH (benign prostatic hyperplasia) 03/09/2008   Home Medication(s) Prior to Admission medications   Medication Sig Start Date End Date Taking? Authorizing Provider  albuterol (PROAIR HFA) 108 (90 Base) MCG/ACT inhaler Inhale 2 puffs into the lungs every 4 (four) hours as needed for wheezing or shortness of breath. 03/30/19   Colon Branch, MD  albuterol (PROVENTIL) (2.5 MG/3ML) 0.083% nebulizer solution Take 3 mLs (2.5 mg  total) by nebulization every 8 (eight) hours as needed for wheezing or shortness of breath. 04/30/20   Colon Branch, MD  ALPRAZolam Duanne Moron) 1 MG tablet TAKE 1-1&1/2 TABLETS BY MOUTH AT BEDTIME 06/10/20   Colon Branch, MD  apixaban (ELIQUIS) 5 MG TABS tablet Take 1 tablet (5 mg total) by mouth 2 (two) times daily. 07/23/20   Larey Dresser, MD  augmented betamethasone dipropionate (DIPROLENE-AF) 0.05 % cream Apply topically 2 (two) times daily as needed. 12/25/19   Colon Branch, MD  bisacodyl (DULCOLAX) 5 MG EC tablet Take 5 mg by mouth daily as needed for moderate constipation.    [provider]  diphenhydrAMINE (BENADRYL) 25 MG tablet Take 25 mg by mouth every 8 (eight) hours as needed (allergies/runny nose).     [provider]  empagliflozin (JARDIANCE) 10 MG TABS tablet Take 1 tablet (10 mg total) by mouth daily before breakfast. 08/15/20   Larey Dresser, MD  escitalopram (LEXAPRO) 10 MG tablet Take 1 tablet (10 mg total) by mouth daily. 10/12/19   Colon Branch, MD  esomeprazole (NEXIUM) 40 MG capsule Take 1 capsule (40 mg total) by mouth daily before breakfast. 01/05/17   Kozlow, Donnamarie Poag, MD  flecainide (TAMBOCOR) 50 MG tablet TAKE ONE AND ONE-HALF TABLETS TWICE A DAY 08/23/20   Deboraha Sprang, MD  fluticasone [facility]) 50 MCG/ACT nasal spray Place 1 spray into both nostrils 2 (two) times daily as needed for allergies. Use one spray in each nostril twice daily. 05/31/18   Sueanne Margarita, MD  ipratropium-albuterol (DUONEB) 0.5-2.5 (3) MG/3ML SOLN Take 3 mLs by nebulization 4 (four) times daily. 06/11/20   [name], MD  KLOR-CON M20 20 MEQ tablet TAKE 2 TABLETS (40 MEQ) DAILY 05/17/20   Larey Dresser, MD  losartan (COZAAR) 25 MG tablet Take 1 tablet (25 mg total) by mouth daily. 06/07/20   Colon Branch, MD  metoprolol tartrate (LOPRESSOR) 25 MG tablet Take 1 tablet (25 mg total) by mouth 2 (two) times daily. Please make yearly appt with Dr. Caryl Comes for January 2022 for future  refills. Thank you 1st attempt 08/23/20   Deboraha Sprang, MD  OXYGEN Inhale into the lungs. 2 liters as  needed    [provider]  rosuvastatin (CRESTOR) 5 MG tablet Take 1 tablet (5 mg total) by mouth daily. 04/16/20   Colon Branch, MD  torsemide (DEMADEX) 20 MG tablet Take 40 mg (2 tabs) every other day ALTERNATING with 60 mg (3 tabs) every other day 06/04/20   Larey Dresser, MD  Past Surgical History Past Surgical History:  Procedure Laterality Date  . APPENDECTOMY    . CARDIAC CATHETERIZATION      X3;Dr Caryl Comes, last 01/20/12: mild non-obstructive CAD, normal LV systolic function  . CARDIOVERSION N/A 05/31/2018   Procedure: CARDIOVERSION;  Surgeon: Sueanne Margarita, MD;  Location: [facility] ENDOSCOPY;  Service: Cardiovascular;  Laterality: N/A;  . CARDIOVERSION N/A 06/13/2020   Procedure: CARDIOVERSION;  Surgeon: Larey Dresser, MD;  Location[facility] ENDOSCOPY;  Service: Cardiovascular;  Laterality: N/A;  . COLONOSCOPY    . CYSTOSCOPY  1978   Dr Hartley Barefoot  . EYE SURGERY    . IR THORACENTESIS ASP PLEURAL SPACE W/IMG GUIDE  11/25/2017  . KNEE ARTHROSCOPY  2012   Dr Theda Sers  . LUMBAR LAMINECTOMY  1987   Dr Durward Fortes  . MYELOGRAM  2007  . PERICARDIOCENTESIS N/A 04/07/2017   Procedure: PERICARDIOCENTESIS;  Surgeon: Sherren Mocha, MD;  Location: [place] CV LAB;  Service: Cardiovascular;  Laterality: N/A;  . POLYPECTOMY    . REPAIR OF ACUTE ASCENDING THORACIC AORTIC DISSECTION N/A 03/23/2017   Procedure: REPAIR OF ACUTE ASCENDING THORACIC AORTIC DISSECTION.  Bentall procedure.  Aortic root repleacement with bioprosthetic valve.  Reimplantation of left and right coronary arteries.  Total resection of transverse aortic arch.  Elephant trunk distal anastomosis and debranching of arch vessels.;  Surgeon: Rexene Alberts, MD;  Location: [place];  Service: Vascular;   Laterality: N/A;  . RIGHT HEART CATH N/A 06/15/2019   Procedure: RIGHT HEART CATH;  Surgeon: Larey Dresser, MD;  Location: [place] CV LAB;  Service: Cardiovascular;  Laterality: N/A;  . ROTATOR CUFF REPAIR    . SHOULDER ARTHROSCOPY  2011   Dr Theda Sers  . SHOULDER ARTHROSCOPY Right 08/2015  . SPINAL FUSION  1986   Dr Rolin Barry  . TEE WITHOUT CARDIOVERSION N/A 03/23/2017   Procedure: TRANSESOPHAGEAL ECHOCARDIOGRAM (TEE);  Surgeon: Rexene Alberts, MD;  Location: [place];  Service: Open Heart Surgery;  Laterality: N/A;  . THORACIC AORTIC ENDOVASCULAR STENT GRAFT N/A 03/30/2018   Procedure: THORACIC AORTIC ENDOVASCULAR STENT GRAFT WITH INTRAVASCULAR ULTRASOUND;  Surgeon: Serafina Mitchell, MD;  Location: [facility] OR;  Service: Vascular;  Laterality: N/A;  . TRACHEOSTOMY     age 60 for croup   Family History Family History  Problem Relation Age of Onset  . Hypertension Mother   . Hypertension Father   . Benign prostatic hyperplasia Father        S/P TURP  . Heart attack Maternal Grandmother        MI in 65s  . Breast cancer Maternal Grandmother   . Arrhythmia Brother         X 2  . Heart attack Maternal Aunt        MI in 2s  . Stroke Neg Hx   . Diabetes Neg Hx   . Colon cancer Neg Hx   . Colon polyps Neg Hx   . Stomach cancer Neg Hx   . Rectal cancer Neg Hx   . Esophageal cancer Neg Hx   . Prostate cancer Neg Hx   . Other Neg Hx        gynecomastia    Social History Social History   Tobacco Use  . Smoking status: Former Smoker    Packs/day: 2.00    Years: 45.00    Pack years: 90.00    Types: Cigarettes    Quit date: 03/22/2017    Years since quitting: 3.4  . Smokeless tobacco: Never  Used  . Tobacco comment: 2 ppd , quit 03/2017  Vaping Use  . Vaping Use: Never used  Substance Use Topics  . Alcohol use: Not Currently    Alcohol/week: 0.0 standard drinks    Comment: 5 cans of beer daily  . Drug use: No   Allergies Simvastatin, Celecoxib, Codeine, Nsaids, Tape,  Cephalexin, and Latex  Review of Systems Review of Systems All other systems are reviewed and are negative for acute change except as noted in the HPI  Physical Exam Vital Signs  I have reviewed the triage vital signs BP 132/63   Pulse 65   Temp 98.8 F (37.1 C) (Oral)   Resp (!) 24   Ht 6' (1.829 m)   Wt 90.7 kg   SpO2 91%   BMI 27.12 kg/m   Physical Exam Vitals reviewed.  Constitutional:      General: He is not in acute distress.    Appearance: He is well-developed and well-nourished. He is not diaphoretic.  HENT:     Head: Normocephalic and atraumatic.     Nose: Nose normal.  Eyes:     General: No scleral icterus.       Right eye: No discharge.        Left eye: No discharge.     Extraocular Movements: EOM normal.     Conjunctiva/sclera: Conjunctivae normal.     Pupils: Pupils are equal, round, and reactive to light.  Cardiovascular:     Rate and Rhythm: Normal rate and regular rhythm.     Heart sounds: No murmur heard. No friction rub. No gallop.   Pulmonary:     Effort: Tachypnea, accessory muscle usage, prolonged expiration and respiratory distress present.     Breath sounds: No stridor. Examination of the right-middle field reveals rales. Examination of the left-middle field reveals rales. Examination of the right-lower field reveals rales. Examination of the left-lower field reveals rales. Rales present. No wheezing.  Abdominal:     General: There is no distension.     Palpations: Abdomen is soft.     Tenderness: There is no abdominal tenderness.  Musculoskeletal:        General: No tenderness or edema.     Cervical back: Normal range of motion and neck supple.  Skin:    General: Skin is warm and dry.     Findings: No erythema or rash.  Neurological:     Mental Status: He is alert and oriented to person, place, and time.  Psychiatric:        Mood and Affect: Mood and affect normal.     ED Results and Treatments Labs (all labs ordered are listed, but  only abnormal results are displayed) Labs Reviewed  COMPREHENSIVE METABOLIC PANEL - Abnormal; Notable for the following components:      Result Value   Sodium 132 (*)    Chloride 95 (*)    Glucose, Bld 144 (*)    BUN 24 (*)    Creatinine, Ser 1.99 (*)    Total Bilirubin 1.7 (*)    GFR, Estimated 35 (*)    All other components within normal limits  CBC WITH DIFFERENTIAL/PLATELET - Abnormal; Notable for the following components:   WBC 12.0 (*)    Neutro Abs 10.4 (*)    Lymphs Abs 0.3 (*)    All other components within normal limits  PROTIME-INR - Abnormal; Notable for the following components:   Prothrombin Time 23.3 (*)    INR 2.2 (*)  All other components within normal limits  I-STAT CHEM 8, ED - Abnormal; Notable for the following components:   Sodium 133 (*)    Chloride 94 (*)    BUN 30 (*)    Creatinine, Ser 1.90 (*)    Glucose, Bld 141 (*)    Calcium, Ion 1.13 (*)    All other components within normal limits  I-STAT VENOUS BLOOD GAS, ED - Abnormal; Notable for the following components:   pH, Ven 7.433 (*)    pO2, Ven 92.0 (*)    Bicarbonate 29.7 (*)    Acid-Base Excess 5.0 (*)    Sodium 133 (*)    Calcium, Ion 1.09 (*)    All other components within normal limits  CULTURE, BLOOD (ROUTINE X 2)  CULTURE, BLOOD (ROUTINE X 2)  SARS CORONAVIRUS 2 BY RT PCR (HOSPITAL ORDER, [place] [facility])  LACTIC ACID, PLASMA  LACTIC ACID, PLASMA  URINALYSIS, ROUTINE W REFLEX MICROSCOPIC  BRAIN NATRIURETIC PEPTIDE  BLOOD GAS, VENOUS  POC SARS CORONAVIRUS 2 AG -  ED  TROPONIN I (HIGH SENSITIVITY)                                                                                                                         EKG  EKG Interpretation  Date/Time:  Thursday September 05 2020 23:06:05 EST Ventricular Rate:  76 PR Interval:    QRS Duration: 159 QT Interval:  474 QTC Calculation: 533 R Axis:   -22 Text Interpretation: Sinus rhythm Atrial premature complex  Right bundle branch block No acute changes Confirmed by Addison Lank [phone]) on 09/05/2020 11:09:22 PM      Radiology DG Chest Port 1 View  Result Date: 09/05/2020 CLINICAL DATA:  Shortness of breath EXAM: PORTABLE CHEST 1 VIEW COMPARISON:  04/30/2020 FINDINGS: Moderate cardiomegaly with small pleural effusions and parahilar opacities, likely pulmonary edema. Remote median sternotomy with aortic stent. IMPRESSION: Cardiomegaly, pleural effusions and mild pulmonary edema. Electronically Signed   By: Ulyses Jarred M.D.   On: 09/05/2020 23:19    Pertinent labs & imaging results that were available during my care of the patient were reviewed by me and considered in my medical decision making (see chart for details).  Medications Ordered in ED Medications  methylPREDNISolone sodium succinate (SOLU-MEDROL) 125 mg/2 mL injection 125 mg (has no administration in time range)  AeroChamber Plus Flo-Vu Large MISC 1 each (has no administration in time range)  ipratropium (ATROVENT HFA) inhaler 2 puff (has no administration in time range)  albuterol (VENTOLIN HFA) 108 (90 Base) MCG/ACT inhaler 6 puff (has no administration in time range)  furosemide (LASIX) injection 60 mg (has no administration in time range)  AeroChamber Plus Flo-Vu Large MISC (has no administration in time range)  Procedures .1-3 Lead EKG Interpretation Performed by: Fatima Blank, MD Authorized by: Fatima Blank, MD     Interpretation: normal     ECG rate:  78   ECG rate assessment: normal     Rhythm: sinus rhythm     Ectopy: none     Conduction: normal   .Critical Care Performed by: Fatima Blank, MD Authorized by: Fatima Blank, MD   Critical care provider statement:    Critical care time (minutes):  45   Critical care was necessary to treat or prevent  imminent or life-threatening deterioration of the following conditions:  Respiratory failure and cardiac failure   Critical care was time spent personally by me on the following activities:  Discussions with consultants, evaluation of patient's response to treatment, examination of patient, ordering and performing treatments and interventions, ordering and review of laboratory studies, ordering and review of radiographic studies, pulse oximetry, re-evaluation of patient's condition, obtaining history from patient or surrogate and review of old charts   Care discussed with: admitting provider      (including critical care time)  Medical Decision Making / ED Course I have reviewed the nursing notes for this encounter and the patient's prior records (if available in EHR or on provided paperwork).   Sean Riley was evaluated in Emergency Department on 09/06/2020 for the symptoms described in the history of present illness. He was evaluated in the context of the global COVID-19 pandemic, which necessitated consideration that the patient might be at risk for infection with the SARS-CoV-2 virus that causes COVID-19. Institutional protocols and algorithms that pertain to the evaluation of patients at risk for COVID-19 are in a state of rapid change based on information released by regulatory bodies including the CDC and federal and state organizations. These policies and algorithms were followed during the patient's care in the ED.  Work up consistent with CHF exacerbation with superimposed COPD. CXR with edema. He requires 6LNC. Given albuterol/atrovent puffs, solumedrol, and lasix.  WOB improved. No evidence of infection. COVID negative.  Admitted to medicine for further management.      Final Clinical Impression(s) / ED Diagnoses Final diagnoses:  Acute on chronic diastolic congestive heart failure ([place])  COPD exacerbation ([place])      This chart was dictated using voice recognition  software.  Despite best efforts to proofread,  errors can occur which can change the documentation meaning.   Fatima Blank, MD 09/06/20 [phone]

## 2020-09-05 NOTE — ED Triage Notes (Signed)
Pt came via EMS due to SOB. SOB started Monday and worsened today. Pt has a hx of COPD and CHF. EMS states pt was 77% on RA. Pt went up to 99% on NRB.

## 2020-09-06 ENCOUNTER — Inpatient Hospital Stay (HOSPITAL_COMMUNITY): Payer: Medicare Other

## 2020-09-06 DIAGNOSIS — Z20822 Contact with and (suspected) exposure to covid-19: Secondary | ICD-10-CM | POA: Diagnosis not present

## 2020-09-06 DIAGNOSIS — I4821 Permanent atrial fibrillation: Secondary | ICD-10-CM | POA: Diagnosis not present

## 2020-09-06 DIAGNOSIS — F32A Depression, unspecified: Secondary | ICD-10-CM | POA: Diagnosis present

## 2020-09-06 DIAGNOSIS — I5031 Acute diastolic (congestive) heart failure: Secondary | ICD-10-CM

## 2020-09-06 DIAGNOSIS — I130 Hypertensive heart and chronic kidney disease with heart failure and stage 1 through stage 4 chronic kidney disease, or unspecified chronic kidney disease: Secondary | ICD-10-CM | POA: Diagnosis not present

## 2020-09-06 DIAGNOSIS — J9621 Acute and chronic respiratory failure with hypoxia: Secondary | ICD-10-CM | POA: Diagnosis not present

## 2020-09-06 DIAGNOSIS — J441 Chronic obstructive pulmonary disease with (acute) exacerbation: Secondary | ICD-10-CM | POA: Diagnosis not present

## 2020-09-06 DIAGNOSIS — N1832 Chronic kidney disease, stage 3b: Secondary | ICD-10-CM | POA: Diagnosis not present

## 2020-09-06 DIAGNOSIS — Z881 Allergy status to other antibiotic agents status: Secondary | ICD-10-CM | POA: Diagnosis not present

## 2020-09-06 DIAGNOSIS — Z91040 Latex allergy status: Secondary | ICD-10-CM | POA: Diagnosis not present

## 2020-09-06 DIAGNOSIS — Z9981 Dependence on supplemental oxygen: Secondary | ICD-10-CM | POA: Diagnosis not present

## 2020-09-06 DIAGNOSIS — J9601 Acute respiratory failure with hypoxia: Secondary | ICD-10-CM | POA: Diagnosis present

## 2020-09-06 DIAGNOSIS — N400 Enlarged prostate without lower urinary tract symptoms: Secondary | ICD-10-CM | POA: Diagnosis present

## 2020-09-06 DIAGNOSIS — R0602 Shortness of breath: Secondary | ICD-10-CM | POA: Diagnosis not present

## 2020-09-06 DIAGNOSIS — E871 Hypo-osmolality and hyponatremia: Secondary | ICD-10-CM | POA: Diagnosis not present

## 2020-09-06 DIAGNOSIS — K219 Gastro-esophageal reflux disease without esophagitis: Secondary | ICD-10-CM | POA: Diagnosis present

## 2020-09-06 DIAGNOSIS — D72829 Elevated white blood cell count, unspecified: Secondary | ICD-10-CM | POA: Diagnosis not present

## 2020-09-06 DIAGNOSIS — F419 Anxiety disorder, unspecified: Secondary | ICD-10-CM | POA: Diagnosis present

## 2020-09-06 DIAGNOSIS — J90 Pleural effusion, not elsewhere classified: Secondary | ICD-10-CM | POA: Diagnosis not present

## 2020-09-06 DIAGNOSIS — I5033 Acute on chronic diastolic (congestive) heart failure: Secondary | ICD-10-CM | POA: Diagnosis not present

## 2020-09-06 DIAGNOSIS — I248 Other forms of acute ischemic heart disease: Secondary | ICD-10-CM | POA: Diagnosis not present

## 2020-09-06 DIAGNOSIS — Z7901 Long term (current) use of anticoagulants: Secondary | ICD-10-CM | POA: Diagnosis not present

## 2020-09-06 DIAGNOSIS — G4733 Obstructive sleep apnea (adult) (pediatric): Secondary | ICD-10-CM | POA: Diagnosis not present

## 2020-09-06 DIAGNOSIS — E7439 Other disorders of intestinal carbohydrate absorption: Secondary | ICD-10-CM | POA: Diagnosis not present

## 2020-09-06 DIAGNOSIS — Z886 Allergy status to analgesic agent status: Secondary | ICD-10-CM | POA: Diagnosis not present

## 2020-09-06 DIAGNOSIS — Z888 Allergy status to other drugs, medicaments and biological substances status: Secondary | ICD-10-CM | POA: Diagnosis not present

## 2020-09-06 DIAGNOSIS — E785 Hyperlipidemia, unspecified: Secondary | ICD-10-CM | POA: Diagnosis present

## 2020-09-06 DIAGNOSIS — R7302 Impaired glucose tolerance (oral): Secondary | ICD-10-CM | POA: Diagnosis present

## 2020-09-06 LAB — BASIC METABOLIC PANEL
Anion gap: 13 (ref 5–15)
BUN: 22 mg/dL (ref 8–23)
CO2: 25 mmol/L (ref 22–32)
Calcium: 9 mg/dL (ref 8.9–10.3)
Chloride: 94 mmol/L — ABNORMAL LOW (ref 98–111)
Creatinine, Ser: 1.8 mg/dL — ABNORMAL HIGH (ref 0.61–1.24)
GFR, Estimated: 40 mL/min — ABNORMAL LOW (ref >60)
Glucose, Bld: 142 mg/dL — ABNORMAL HIGH (ref 70–99)
Potassium: 4.2 mmol/L (ref 3.5–5.1)
Sodium: 132 mmol/L — ABNORMAL LOW (ref 135–145)

## 2020-09-06 LAB — ECHOCARDIOGRAM COMPLETE
AR max vel: 1.88 cm2
AV Area VTI: 1.81 cm2
AV Area mean vel: 2.01 cm2
AV Mean grad: 18.3 mmHg
AV Peak grad: 34.4 mmHg
Ao pk vel: 2.93 m/s
Height: 72 in
S' Lateral: 3.5 cm
Weight: 3200 oz

## 2020-09-06 LAB — COMPREHENSIVE METABOLIC PANEL
ALT: 10 U/L (ref 0–44)
AST: 15 U/L (ref 15–41)
Albumin: 3.7 g/dL (ref 3.5–5.0)
Alkaline Phosphatase: 86 U/L (ref 38–126)
Anion gap: 12 (ref 5–15)
BUN: 24 mg/dL — ABNORMAL HIGH (ref 8–23)
CO2: 25 mmol/L (ref 22–32)
Calcium: 9 mg/dL (ref 8.9–10.3)
Chloride: 95 mmol/L — ABNORMAL LOW (ref 98–111)
Creatinine, Ser: 1.99 mg/dL — ABNORMAL HIGH (ref 0.61–1.24)
GFR, Estimated: 35 mL/min — ABNORMAL LOW (ref >60)
Glucose, Bld: 144 mg/dL — ABNORMAL HIGH (ref 70–99)
Potassium: 4.3 mmol/L (ref 3.5–5.1)
Sodium: 132 mmol/L — ABNORMAL LOW (ref 135–145)
Total Bilirubin: 1.7 mg/dL — ABNORMAL HIGH (ref 0.3–1.2)
Total Protein: 7.4 g/dL (ref 6.5–8.1)

## 2020-09-06 LAB — CBC
HCT: 40.9 % (ref 39.0–52.0)
Hemoglobin: 13.7 g/dL (ref 13.0–17.0)
MCH: 31.5 pg (ref 26.0–34.0)
MCHC: 33.5 g/dL (ref 30.0–36.0)
MCV: 94 fL (ref 80.0–100.0)
Platelets: 195 10*3/uL (ref 150–400)
RBC: 4.35 MIL/uL (ref 4.22–5.81)
RDW: 14.8 % (ref 11.5–15.5)
WBC: 10.4 10*3/uL (ref 4.0–10.5)
nRBC: 0 % (ref 0.0–0.2)

## 2020-09-06 LAB — LACTIC ACID, PLASMA: Lactic Acid, Venous: 0.8 mmol/L (ref 0.5–1.9)

## 2020-09-06 LAB — URINALYSIS, ROUTINE W REFLEX MICROSCOPIC
Bilirubin Urine: NEGATIVE
Glucose, UA: NEGATIVE mg/dL
Hgb urine dipstick: NEGATIVE
Ketones, ur: NEGATIVE mg/dL
Leukocytes,Ua: NEGATIVE
Nitrite: NEGATIVE
Protein, ur: NEGATIVE mg/dL
Specific Gravity, Urine: 1.008 (ref 1.005–1.030)
pH: 5 (ref 5.0–8.0)

## 2020-09-06 LAB — PROTIME-INR
INR: 2.2 — ABNORMAL HIGH (ref 0.8–1.2)
Prothrombin Time: 23.3 seconds — ABNORMAL HIGH (ref 11.4–15.2)

## 2020-09-06 LAB — BRAIN NATRIURETIC PEPTIDE: B Natriuretic Peptide: 354.4 pg/mL — ABNORMAL HIGH (ref 0.0–100.0)

## 2020-09-06 LAB — TROPONIN I (HIGH SENSITIVITY)
Troponin I (High Sensitivity): 56 ng/L — ABNORMAL HIGH (ref <18)
Troponin I (High Sensitivity): 75 ng/L — ABNORMAL HIGH (ref <18)

## 2020-09-06 LAB — SARS CORONAVIRUS 2 BY RT PCR (HOSPITAL ORDER, PERFORMED IN ~~LOC~~ HOSPITAL LAB): SARS Coronavirus 2: NEGATIVE

## 2020-09-06 LAB — PROCALCITONIN: Procalcitonin: 0.11 ng/mL

## 2020-09-06 LAB — POC SARS CORONAVIRUS 2 AG -  ED: SARS Coronavirus 2 Ag: NEGATIVE

## 2020-09-06 MED ORDER — APIXABAN 5 MG PO TABS
5.0000 mg | ORAL_TABLET | Freq: Two times a day (BID) | ORAL | Status: DC
  Administered 2020-09-06 – 2020-09-08 (×5): 5 mg via ORAL
  Filled 2020-09-06 (×5): qty 1

## 2020-09-06 MED ORDER — ALBUTEROL SULFATE (2.5 MG/3ML) 0.083% IN NEBU
2.5000 mg | INHALATION_SOLUTION | Freq: Three times a day (TID) | RESPIRATORY_TRACT | Status: DC | PRN
  Administered 2020-09-07: 2.5 mg via RESPIRATORY_TRACT
  Filled 2020-09-06 (×2): qty 3

## 2020-09-06 MED ORDER — FUROSEMIDE 10 MG/ML IJ SOLN
60.0000 mg | Freq: Once | INTRAMUSCULAR | Status: AC
  Administered 2020-09-06: 60 mg via INTRAVENOUS
  Filled 2020-09-06: qty 6

## 2020-09-06 MED ORDER — AEROCHAMBER PLUS FLO-VU LARGE MISC
Status: AC
  Administered 2020-09-06: 1
  Filled 2020-09-06: qty 1

## 2020-09-06 MED ORDER — ALPRAZOLAM 0.25 MG PO TABS
0.2500 mg | ORAL_TABLET | Freq: Every evening | ORAL | Status: DC | PRN
  Administered 2020-09-06 – 2020-09-07 (×2): 0.25 mg via ORAL
  Filled 2020-09-06 (×2): qty 1

## 2020-09-06 MED ORDER — FUROSEMIDE 10 MG/ML IJ SOLN: 40.0000 mg | Freq: Every day | INTRAMUSCULAR | Status: DC

## 2020-09-06 MED ORDER — TAMSULOSIN HCL 0.4 MG PO CAPS
0.4000 mg | ORAL_CAPSULE | Freq: Every day | ORAL | Status: DC
  Administered 2020-09-06 – 2020-09-08 (×3): 0.4 mg via ORAL
  Filled 2020-09-06 (×3): qty 1

## 2020-09-06 MED ORDER — AEROCHAMBER PLUS FLO-VU LARGE MISC: 1.0000 | Freq: Once | Status: AC

## 2020-09-06 MED ORDER — FUROSEMIDE 10 MG/ML IJ SOLN
20.0000 mg | Freq: Three times a day (TID) | INTRAMUSCULAR | Status: DC
  Administered 2020-09-06 – 2020-09-07 (×4): 20 mg via INTRAVENOUS
  Filled 2020-09-06 (×4): qty 2

## 2020-09-06 MED ORDER — IPRATROPIUM BROMIDE HFA 17 MCG/ACT IN AERS
2.0000 | INHALATION_SPRAY | Freq: Once | RESPIRATORY_TRACT | Status: AC
  Administered 2020-09-06: 2 via RESPIRATORY_TRACT
  Filled 2020-09-06: qty 12.9

## 2020-09-06 MED ORDER — METOPROLOL TARTRATE 12.5 MG HALF TABLET
12.5000 mg | ORAL_TABLET | Freq: Two times a day (BID) | ORAL | Status: DC
  Administered 2020-09-06 – 2020-09-08 (×5): 12.5 mg via ORAL
  Filled 2020-09-06 (×6): qty 1

## 2020-09-06 MED ORDER — ROSUVASTATIN CALCIUM 5 MG PO TABS
5.0000 mg | ORAL_TABLET | Freq: Every day | ORAL | Status: DC
  Administered 2020-09-06 – 2020-09-08 (×3): 5 mg via ORAL
  Filled 2020-09-06 (×3): qty 1

## 2020-09-06 MED ORDER — METHYLPREDNISOLONE SODIUM SUCC 125 MG IJ SOLR
125.0000 mg | Freq: Once | INTRAMUSCULAR | Status: AC
  Administered 2020-09-06: 125 mg via INTRAVENOUS
  Filled 2020-09-06: qty 2

## 2020-09-06 MED ORDER — ALBUTEROL SULFATE HFA 108 (90 BASE) MCG/ACT IN AERS
6.0000 | INHALATION_SPRAY | Freq: Once | RESPIRATORY_TRACT | Status: AC
  Administered 2020-09-06: 6 via RESPIRATORY_TRACT
  Filled 2020-09-06: qty 6.7

## 2020-09-06 MED ORDER — ALBUTEROL SULFATE HFA 108 (90 BASE) MCG/ACT IN AERS
2.0000 | INHALATION_SPRAY | RESPIRATORY_TRACT | Status: DC | PRN
  Administered 2020-09-06 – 2020-09-07 (×2): 2 via RESPIRATORY_TRACT

## 2020-09-06 MED ORDER — ESCITALOPRAM OXALATE 10 MG PO TABS
10.0000 mg | ORAL_TABLET | Freq: Every day | ORAL | Status: DC
  Administered 2020-09-06 – 2020-09-08 (×3): 10 mg via ORAL
  Filled 2020-09-06 (×3): qty 1

## 2020-09-06 MED ORDER — IPRATROPIUM-ALBUTEROL 0.5-2.5 (3) MG/3ML IN SOLN
3.0000 mL | Freq: Four times a day (QID) | RESPIRATORY_TRACT | Status: DC
  Administered 2020-09-06 (×3): 3 mL via RESPIRATORY_TRACT
  Filled 2020-09-06 (×3): qty 3

## 2020-09-06 NOTE — Progress Notes (Signed)
  Echocardiogram 2D Echocardiogram has been performed.  Sean Riley 09/06/2020, 2:12 PM

## 2020-09-06 NOTE — Evaluation (Signed)
Physical Therapy Evaluation Patient Details Name: Sean Riley MRN: [id number] DOB: 1951-04-26 Today's Date: 09/06/2020   History of Present Illness  Pt is a 70 y/o male admitted secondary to Acute on chronic hypoxic respiratory failure secondary to acute pulmonary edema and bilateral pleural effusions. PMH includes a fib, COPD, dCHF, and HTN.  Clinical Impression  Pt admitted secondary to problem above with deficits below. Pt requiring min A to ambulate to/from ED stretcher. Mobility limited secondary to fatigue. SpO2 at 92-94% on 8L HFNC. Pt reports he currently lives with his wife who is there 24/7. Anticipate pt will progress well, however, if does not, may need to consider SNF level therapies. Will continue to follow acutely.     Follow Up Recommendations Home health PT;Supervision/Assistance - 24 hour (pending mobility progression)    Equipment Recommendations  3in1 (PT)    Recommendations for Other Services       Precautions / Restrictions Precautions Precautions: Fall Restrictions Weight Bearing Restrictions: No      Mobility  Bed Mobility Overal bed mobility: Needs Assistance Bed Mobility: Supine to Sit;Sit to Supine     Supine to sit: Min assist Sit to supine: Min assist   General bed mobility comments: Min A for trunk and LE assist.    Transfers Overall transfer level: Needs assistance Equipment used: 1 person hand held assist Transfers: Sit to/from Stand Sit to Stand: Min assist         General transfer comment: Min A for steadying assist.  Ambulation/Gait Ambulation/Gait assistance: Min assist   Assistive device: 1 person hand held assist   Gait velocity: Decreased   General Gait Details: Took steps to/from EOB and side steps. Noticed mild instability and required min A for steadying.  Stairs            Wheelchair Mobility    Modified Rankin (Stroke Patients Only)       Balance Overall balance assessment: Needs  assistance Sitting-balance support: No upper extremity supported;Feet supported Sitting balance-[name]: Fair     Standing balance support: Single extremity supported Standing balance-[name]: Poor Standing balance comment: Reliant on at least 1 UE support                             Pertinent Vitals/Pain Pain Assessment: No/denies pain    Home Living Family/patient expects to be discharged to:: Private residence Living Arrangements: Spouse/significant other Available Help at Discharge: Family Type of Home: House Home Access: Stairs to enter Entrance Stairs-Rails: Psychiatric nurse of Steps: 5 Home Layout: One level Home Equipment: Environmental consultant - 2 wheels;Cane - single point      Prior Function Level of Independence: Needs assistance   Gait / Transfers Assistance Needed: Reports he uses cane vs RW depending on the day.  ADL's / Homemaking Assistance Needed: Reports wife assists with bathing tasks and dressing.        Hand Dominance        Extremity/Trunk Assessment   Upper Extremity Assessment Upper Extremity Assessment: Defer to OT evaluation    Lower Extremity Assessment Lower Extremity Assessment: Generalized weakness    Cervical / Trunk Assessment Cervical / Trunk Assessment: Kyphotic  Communication   Communication: HOH  Cognition Arousal/Alertness: Awake/alert Behavior During Therapy: WFL for tasks assessed/performed Overall Cognitive Status: No family/caregiver present to determine baseline cognitive functioning  General Comments: Slow processing. Increased time to respond to question. reporting somewhat conflicting information throughout.      General Comments General comments (skin integrity, edema, etc.): Oxygen sats at 92-94% on 8L HFNC    Exercises     Assessment/Plan    PT Assessment Patient needs continued PT services  PT Problem List Decreased  strength;Decreased balance;Decreased activity tolerance;Decreased mobility;Decreased knowledge of use of DME;Decreased knowledge of precautions;Cardiopulmonary status limiting activity       PT Treatment Interventions DME instruction;Stair training;Gait training;Functional mobility training;Balance training;Therapeutic exercise;Therapeutic activities;Patient/family education;Cognitive remediation    PT Goals (Current goals can be found in the Care Plan section)  Acute Rehab PT Goals Patient Stated Goal: to go home PT Goal Formulation: With patient Time For Goal Achievement: 09/20/20 Potential to Achieve Goals: Good    Frequency Min 3X/week   Barriers to discharge        Co-evaluation               AM-PAC PT "6 Clicks" Mobility  Outcome Measure Help needed turning from your back to your side while in a flat bed without using bedrails?: A Little Help needed moving from lying on your back to sitting on the side of a flat bed without using bedrails?: A Little Help needed moving to and from a bed to a chair (including a wheelchair)?: A Little Help needed standing up from a chair using your arms (e.g., wheelchair or bedside chair)?: A Little Help needed to walk in hospital room?: A Little Help needed climbing 3-5 steps with a railing? : A Lot 6 Click Score: 17    End of Session Equipment Utilized During Treatment: Gait belt Activity Tolerance: Patient limited by fatigue Patient left: in bed;with call bell/phone within reach (on stretcher in ED) Nurse Communication: Mobility status PT Visit Diagnosis: Unsteadiness on feet (R26.81);Muscle weakness (generalized) (M62.81)    Time: 3614-4315 PT Time Calculation (min) (ACUTE ONLY): 18 min   Charges:   PT Evaluation $PT Eval Moderate Complexity: 1 Mod          Reuel Derby, PT, DPT  Acute Rehabilitation Services  Pager: [phone] Office: [phone]   Rudean Hitt 09/06/2020, 1:31 PM

## 2020-09-06 NOTE — ED Notes (Signed)
Repositioned pt in bed. Wife at bedside.

## 2020-09-06 NOTE — ED Notes (Signed)
Dinner Trays Ordered @ 1635. 

## 2020-09-06 NOTE — H&P (Signed)
History and Physical  Sean Riley DOB: [birth year] DOA: 09/05/2020  Referring physician: Dr. Eudelia Bunchardama, EDP.  PCP: Wanda PlumpPaz, Jose E, MD  Outpatient Specialists: Cardiology, pulmonary, vascular surgery. Patient coming from: Home.  Chief Complaint: Shortness of breath. HPI: Sean Riley is a 70 y.o. male with medical history significant for persistent A. 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  Associated with significant hypoxia and altered mental status.  Unable to obtain any history from the patient due to altered mental status.  EMS states patient O2 saturation was 77% on room air which improved after placing on NRB.  Chest x-ray revealed cardiomegaly, increase in pulmonary vascularity suggestive of pulmonary edema, and bilateral pleural effusions.  Elevated BNP greater than 300.  Currently on 10 L with O2 saturation of 93%.  COVID-19 screening test negative.  TRH was asked to admit for further evaluation and management of acute hypoxic.  ED Course: T-max 100.0, respiration rate 29, O2 saturation 74% on room air.  UA negative for pyuria.  Troponin 75 from 56, WBC 12.0K, creatinine 1.9, BUN 30, BNP 354.  Review of Systems: Review of systems as noted in the HPI. All other systems reviewed and are negative.   Past Medical History:  Diagnosis Date  . Allergy   . Arthritis   . Ascending aortic dissection (HCC) 03/23/2017  . Atrial fibrillation (HCC)   . Cataract    removed both eyes   . CHF (congestive heart failure) (HCC)   . GERD (gastroesophageal reflux disease)   . HYPERLIPIDEMIA   . 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 Hypertension   . Microscopic hematuria    negative cystoscopy  . NONSPECIFIC ABNORMAL ELECTROCARDIOGRAM   . Pleural effusion on right   . Pleural effusion, bilateral   . S/P aortic  dissection repair 03/24/2017   Biological Bentall aortic root replacement + resection and grafting of entire ascending aorta, transverse aortic arch and proximal descending thoracic aorta with elephant trunk distal anastomosis and debranching of aortic arch vessels  . 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 Varicose veins    Past Surgical History:  Procedure Laterality Date  . APPENDECTOMY    . CARDIAC CATHETERIZATION      X3;Dr Graciela HusbandsKlein, last 01/20/12: mild non-obstructive CAD, normal LV systolic function  . CARDIOVERSION N/A 05/31/2018   Procedure: CARDIOVERSION;  Surgeon: Quintella Reicherturner, Traci R, MD;  [facility];  Service: Cardiovascular;  Laterality: N/A;  . CARDIOVERSION N/A 06/13/2020   Procedure: CARDIOVERSION;  Surgeon: Laurey MoraleMcLean, Dalton S, MD;  [facility];  Service: Cardiovascular;  Laterality: N/A;  . COLONOSCOPY    . CYSTOSCOPY  1978   Dr Marcello Fennelannebaum  . EYE SURGERY    . IR THORACENTESIS ASP PLEURAL SPACE W/IMG GUIDE  11/25/2017  . KNEE ARTHROSCOPY  2012   Dr Thomasena Edisollins  . LUMBAR LAMINECTOMY  1987   Dr Cleophas DunkerWhitfield  . MYELOGRAM  2007  . PERICARDIOCENTESIS N/A 04/07/2017   Procedure: PERICARDIOCENTESIS;  Surgeon: Tonny Bollmanooper, Michael, MD;  [facility];  Service: Cardiovascular;  Laterality: N/A;  . POLYPECTOMY    . REPAIR OF ACUTE ASCENDING THORACIC AORTIC DISSECTION N/A 03/23/2017  Procedure: REPAIR OF ACUTE ASCENDING THORACIC AORTIC DISSECTION.  Bentall procedure.  Aortic root repleacement with bioprosthetic valve.  Reimplantation of left and right coronary arteries.  Total resection of transverse aortic arch.  Elephant trunk distal anastomosis and debranching of arch vessels.;  Surgeon: Rexene Alberts, MD;  Location: [place];  Service: Vascular;  Laterality: N/A;  .  RIGHT HEART CATH N/A 06/15/2019   Procedure: RIGHT HEART CATH;  Surgeon: Larey Dresser, MD;  Location: [place] CV LAB;  Service: Cardiovascular;  Laterality: N/A;  . ROTATOR CUFF REPAIR    . SHOULDER ARTHROSCOPY  2011   Dr Theda Sers  . SHOULDER ARTHROSCOPY Right 08/2015  . SPINAL FUSION  1986   Dr Rolin Barry  . TEE WITHOUT CARDIOVERSION N/A 03/23/2017   Procedure: TRANSESOPHAGEAL ECHOCARDIOGRAM (TEE);  Surgeon: Rexene Alberts, MD;  Location: [place];  Service: Open Heart Surgery;  Laterality: N/A;  . THORACIC AORTIC ENDOVASCULAR STENT GRAFT N/A 03/30/2018   Procedure: THORACIC AORTIC ENDOVASCULAR STENT GRAFT WITH INTRAVASCULAR ULTRASOUND;  Surgeon: Serafina Mitchell, MD;  Location: [facility] OR;  Service: Vascular;  Laterality: N/A;  . TRACHEOSTOMY     age 34 for croup    Social History:  reports that he quit smoking about 3 years ago. His smoking use included cigarettes. He has a 90.00 pack-year smoking history. He has never used smokeless tobacco. He reports previous alcohol use. He reports that he does not use drugs.   Allergies  Allergen Reactions  . Simvastatin Other (See Comments)    Mental status changes  . Celecoxib Other (See Comments)    GI UPSET AND INFLAMMATION  . Codeine Other (See Comments)    GI UPSET AND INFLAMMATION  . Nsaids Other (See Comments)    GI UPSET AND INFLAMMATION (can tolerate via IV)  . Tape Other (See Comments)    Medical tape and Band-Aids PULL OFF THE SKIN; please use Coban wrap  . Cephalexin Itching and Rash  . Latex Rash    Family History  Problem Relation Age of Onset  . Hypertension Mother   . Hypertension Father   . Benign prostatic hyperplasia Father        S/P TURP  . Heart attack Maternal Grandmother        MI in 78s  . Breast cancer Maternal Grandmother   . Arrhythmia Brother         X 2  . Heart attack Maternal Aunt        MI in 2s  . Stroke Neg Hx   . Diabetes Neg Hx   . Colon cancer Neg Hx   . Colon polyps Neg Hx   . Stomach  cancer Neg Hx   . Rectal cancer Neg Hx   . Esophageal cancer Neg Hx   . Prostate cancer Neg Hx   . Other Neg Hx        gynecomastia      Prior to Admission medications   Medication Sig Start Date End Date Taking? Authorizing Provider  albuterol (PROAIR HFA) 108 (90 Base) MCG/ACT inhaler Inhale 2 puffs into the lungs every 4 (four) hours as needed for wheezing or shortness of breath. 03/30/19   Colon Branch, MD  albuterol (PROVENTIL) (2.5 MG/3ML) 0.083% nebulizer solution Take 3 mLs (2.5 mg total) by nebulization every 8 (eight) hours as needed for wheezing or shortness of breath. 04/30/20   Colon Branch, MD  ALPRAZolam Duanne Moron) 1 MG tablet TAKE 1-1&1/2 TABLETS BY MOUTH AT BEDTIME 06/10/20  Colon Branch, MD  apixaban (ELIQUIS) 5 MG TABS tablet Take 1 tablet (5 mg total) by mouth 2 (two) times daily. 07/23/20   Larey Dresser, MD  augmented betamethasone dipropionate (DIPROLENE-AF) 0.05 % cream Apply topically 2 (two) times daily as needed. 12/25/19   Colon Branch, MD  bisacodyl (DULCOLAX) 5 MG EC tablet Take 5 mg by mouth daily as needed for moderate constipation.    [provider]  diphenhydrAMINE (BENADRYL) 25 MG tablet Take 25 mg by mouth every 8 (eight) hours as needed (allergies/runny nose).     [provider]  empagliflozin (JARDIANCE) 10 MG TABS tablet Take 1 tablet (10 mg total) by mouth daily before breakfast. 08/15/20   Larey Dresser, MD  escitalopram (LEXAPRO) 10 MG tablet Take 1 tablet (10 mg total) by mouth daily. 10/12/19   Colon Branch, MD  esomeprazole (NEXIUM) 40 MG capsule Take 1 capsule (40 mg total) by mouth daily before breakfast. 01/05/17   Kozlow, Donnamarie Poag, MD  flecainide (TAMBOCOR) 50 MG tablet TAKE ONE AND ONE-HALF TABLETS TWICE A DAY 08/23/20   Deboraha Sprang, MD  fluticasone [facility]) 50 MCG/ACT nasal spray Place 1 spray into both nostrils 2 (two) times daily as needed for allergies. Use one spray in each nostril twice daily. 05/31/18   Sueanne Margarita, MD   ipratropium-albuterol (DUONEB) 0.5-2.5 (3) MG/3ML SOLN Take 3 mLs by nebulization 4 (four) times daily. 06/11/20   [name], MD  KLOR-CON M20 20 MEQ tablet TAKE 2 TABLETS (40 MEQ) DAILY 05/17/20   Larey Dresser, MD  losartan (COZAAR) 25 MG tablet Take 1 tablet (25 mg total) by mouth daily. 06/07/20   Colon Branch, MD  metoprolol tartrate (LOPRESSOR) 25 MG tablet Take 1 tablet (25 mg total) by mouth 2 (two) times daily. Please make yearly appt with Dr. Caryl Comes for January 2022 for future refills. Thank you 1st attempt 08/23/20   Deboraha Sprang, MD  OXYGEN Inhale into the lungs. 2 liters as  needed    [provider]  rosuvastatin (CRESTOR) 5 MG tablet Take 1 tablet (5 mg total) by mouth daily. 04/16/20   Colon Branch, MD  torsemide (DEMADEX) 20 MG tablet Take 40 mg (2 tabs) every other day ALTERNATING with 60 mg (3 tabs) every other day 06/04/20   Larey Dresser, MD    Physical Exam: BP 138/65   Pulse 62   Temp 100 F (37.8 C) (Rectal)   Resp 19   Ht 6' (1.829 m)   Wt 90.7 kg   SpO2 92%   BMI 27.12 kg/m   . General: 70 y.o. year-old male well developed well nourished in no acute distress.  Lethargic and is arousable to voices. . Cardiovascular: Irregular rate and rhythm with no rubs or gallops.  No thyromegaly or JVD noted.  No lower extremity edema. 2/4 pulses in all 4 extremities. Marland Kitchen Respiratory: Rales at bases no wheezing noted.  Poor inspiratory effort. . Abdomen: Soft nontender nondistended with normal bowel sounds x4 quadrants. . Muskuloskeletal: No cyanosis, clubbing or edema noted bilaterally . Neuro: CN II-XII intact, strength, sensation, reflexes . Skin: No ulcerative lesions noted or rashes . Psychiatry: Unable to assess judgment insight or mood due to lethargy.         Labs on Admission:  Basic Metabolic Panel: Recent Labs  Lab 09/05/20 2319 09/05/20 2345 09/05/20 2346  NA 132* 133* 133*  K 4.3 4.5 4.5  CL 95* 94*  --  CO2 25  --   --   GLUCOSE 144*  141*  --   BUN 24* 30*  --   CREATININE 1.99* 1.90*  --   CALCIUM 9.0  --   --    Liver Function Tests: Recent Labs  Lab 09/05/20 2319  AST 15  ALT 10  ALKPHOS 86  BILITOT 1.7*  PROT 7.4  ALBUMIN 3.7   No results for input(s): LIPASE, AMYLASE in the last 168 hours. No results for input(s): AMMONIA in the last 168 hours. CBC: Recent Labs  Lab 09/05/20 2319 09/05/20 2345 09/05/20 2346  WBC 12.0*  --   --   NEUTROABS 10.4*  --   --   HGB 13.4 14.3 13.9  HCT 41.4 42.0 41.0  MCV 95.4  --   --   PLT 186  --   --    Cardiac Enzymes: No results for input(s): CKTOTAL, CKMB, CKMBINDEX, TROPONINI in the last 168 hours.  BNP (last 3 results) Recent Labs    04/30/20 1226 06/04/20 1212 09/05/20 2332  BNP 125* 118.1* 354.4*    ProBNP (last 3 results) No results for input(s): PROBNP in the last 8760 hours.  CBG: No results for input(s): GLUCAP in the last 168 hours.  Radiological Exams on Admission: DG Chest Port 1 View  Result Date: 09/05/2020 CLINICAL DATA:  Shortness of breath EXAM: PORTABLE CHEST 1 VIEW COMPARISON:  04/30/2020 FINDINGS: Moderate cardiomegaly with small pleural effusions and parahilar opacities, likely pulmonary edema. Remote median sternotomy with aortic stent. IMPRESSION: Cardiomegaly, pleural effusions and mild pulmonary edema. Electronically Signed   By: Deatra Robinson M.D.   On: 09/05/2020 23:19    EKG: I independently viewed the EKG done and my findings are as followed: Sinus rhythm rate of 76, nonspecific ST-T changes.  QTc 533.  Assessment/Plan Present on Admission: . Acute respiratory failure with hypoxia (HCC)  Active Problems:   Acute respiratory failure with hypoxia (HCC)  Acute on chronic hypoxic respiratory failure secondary to acute pulmonary edema and bilateral pleural effusions He is on as needed O2 supplementation at baseline. Currently requiring 10 L to maintain his O2 saturation above 90% Personally reviewed chest x-ray done on  admission which shows cardiomegaly, increase in pulmonary vascularity suggestive of pulmonary edema, and bilateral pleural effusions. Ongoing diuresing, initially in the ED, continue Wean off oxygen supplementation as tolerated Maintain O2 saturation greater than 90% Will need home O2 evaluation prior to DC Bronchodilators, incentive spirometer. Mobilize as tolerated.  Acute on chronic diastolic CHF Presented with elevated BNP and x-ray evidence of pulmonary edema. Last 2D echo 12/13/2019 showed preserved LVEF 55%, left ventricle demonstrate regional wall motion abnormality, basal inferior akinesis.  Mild left ventricular hypertrophy.  Grade 2 diastolic dysfunction. Obtain 2D echo. Resume home regimen Continue diuretics Strict I's and O's and daily weight  Elevated troponin, suspect demand ischemia in the setting of severe hypoxia. Troponin peaked at 75 from 56. No evidence of acute ischemia on twelve-lead EKG Continue to monitor on telemetry.  COPD, on O2 supplementation as needed Resume home regimen Maintain oxygen saturation greater than 90%  Leukocytosis WBC 12.0K T-max 100 Urine negative for pyuria No clear evidence of active infective process Lactic acid 0.8. Obtain procalcitonin level  Impaired glucose tolerance Resume home Jardiance Obtain A1c Start insulin sliding scale  Chronic anxiety/depression Resume home regimen  Hyperlipidemia Resume home regimen.  CKD 3B Appears to be at his baseline creatinine 1.9 with GFR of 35 Avoid nephrotoxins, dehydration and hypotension Repeat  renal panel in the morning   DVT prophylaxis: Heparin subcu 3 times daily  Code Status: Full code for now as the patient is not able to provide his CODE STATUS due to altered mental status.  Reassess in the morning.  Family Communication: None at bedside  Disposition Plan: Admit to telemetry medical.  Consults called: None.  Admission status: Inpatient status.  Patient will  require at least 2 midnights for further evaluation and treatment of present condition.   Status is: Inpatient    Dispo:  Patient From: Home  Planned Disposition: Home  Expected discharge date: 09/08/2020  Medically stable for discharge: No, ongoing management of acute on chronic hypoxic respiratory failure.         Kayleen Memos MD [facility] Pager [phone]  If 7PM-7AM, please contact night-coverage [email or web address] Password [email or web address]  09/06/2020, 3:11 AM

## 2020-09-06 NOTE — Progress Notes (Signed)
PROGRESS NOTE    Sean Riley  [id number] DOB: 1951-05-27 DOA: 09/05/2020 PCP: Colon Branch, MD   Chief Complain: Shortness of breath  Brief Narrative: Patient is a 70 year old male with history of permanent A. 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  He was also noted to be altered at home.  On presentation he was saturating 77% on room air.  Chest x-ray showed cardiomegaly, pulmonary edema, bilateral pleural effusions.  BNP was greater than 300.  Covid screen test were negative.  Had mild grade fever on presentation.  Mild elevated troponin.  Patient was admitted for the management of acute on chronic diastolic heart failure  for exacerbation.  Started on IV diuresis  Assessment & Plan:   Active Problems:   Acute respiratory failure with hypoxia (HCC)   Acute on chronic hypoxic respiratory failure: On 2 to 3 L of oxygen at home.  Needed 10 L of oxygen to maintain saturation on presentation.  This is most likely secondary to pulmonary edema, pleural effusions. Continue to monitor respiratory status, try to wean the oxygen if possible.  Continue bronchodilators, incentive spirometer.  Acute on chronic diastolic congestive heart failure: Evidence of volume overload as per the chest imagings.  Elevated BNP.  2D echo done on 12/13/2019 showed ejection fraction [id number], grade 2 diastolic dysfunction.  New echocardiogram pending.  Continue diuresis.  Continue to monitor daily weight, input/output .takes torsemide at home. Follows with Dr Aundra Dubin  Elevated troponin: Denies any chest pain.  Suspected to be from supply demand ischemia.  No evidence of acute ischemia as per twelve-lead EKG.  Monitor on telemetry  COPD: On 2 to 3 L of oxygen at home.  Resume  home regimen.  He was also given a dose of IV steroid for wheezing.  No wheezing auscultated today.He follows with Dr Vaughan Browner  Mild leukocytosis: Currently stable.  Reported to have fever at home.  Afebrile now.  Urinalysis did not show any evidence of urinary tract infection.  Normal procalcitonin level.  Impaired glucose intolerance: Continue sliding-scale insulin.  Takes Jardiance at home.    Permanent A. fib: On metoprolol for rate control.  On Eliquis for anticoagulation.Follows with Dr Caryl Comes.Also take flecainide  Chronic anxiety/depression: Continue home regimen  CKD stage IIIb: Currently kidney function at baseline.  Baseline creatinine 1.9.  Avoid nephrotoxins.  Hyperlipidemia: Continue home regimen  Hypertension: On metoprolol and losartan at home.  Continue to monitor blood pressure  BPH: As per wife ,he has symptoms of LUTS. Started on flomax,he needs to follow up with urology as an outpatient.  Deconditioning/debility: PT recommended home with on discharge           DVT prophylaxis:Eliquis Code Status: None Family Communication: Talked and discussed with wife on phone on 09/06/20 Status is: Inpatient  Remains inpatient appropriate because:Inpatient level of care appropriate due to severity of illness   Dispo:  Patient From: Home  Planned Disposition: Home  Expected discharge date: 09/08/2020  Medically stable for discharge: No      Consultants: None  Procedures:None  Antimicrobials:  Anti-infectives (From admission, onward)   None      Subjective: Patient seen and examined the bedside this morning.  Hemodynamically stable.  He says he feels better.  Was on 5 to  7 L of oxygen per minute.  Does not have significant peripheral edema  Objective: Vitals:   09/06/20 1015 09/06/20 1030 09/06/20 1245 09/06/20 1400  BP: 130/70 117/73 (!) 152/128 130/68  Pulse:   92 90  Resp: 16 17 (!) 25 15  Temp:    (!) 97.4 F (36.3 C)  TempSrc:      SpO2:   90% 93%   Weight:      Height:       No intake or output data in the 24 hours ending 09/06/20 1412 Filed Weights   09/05/20 2308  Weight: 90.7 kg    Examination:  General exam: Deconditioned, debilitated, chronically ill looking male Respiratory system: Bilateral basal crackles  cardiovascular system: Irregularly irregular rhythm, no pedal edema. Gastrointestinal system: Abdomen is nondistended, soft and nontender. No organomegaly or masses felt. Normal bowel sounds heard. Central nervous system: Alert and oriented. No focal neurological deficits. Extremities: No edema, no clubbing ,no cyanosis, Skin: No rashes, lesions or ulcers,no icterus ,no pallor   Data Reviewed: I have personally reviewed following labs and imaging studies  CBC: Recent Labs  Lab 09/05/20 2319 09/05/20 2345 09/05/20 2346 09/06/20 0513  WBC 12.0*  --   --  10.4  NEUTROABS 10.4*  --   --   --   HGB 13.4 14.3 13.9 13.7  HCT 41.4 42.0 41.0 40.9  MCV 95.4  --   --  94.0  PLT 186  --   --  147   Basic Metabolic Panel: Recent Labs  Lab 09/05/20 2319 09/05/20 2345 09/05/20 2346 09/06/20 0513  NA 132* 133* 133* 132*  K 4.3 4.5 4.5 4.2  CL 95* 94*  --  94*  CO2 25  --   --  25  GLUCOSE 144* 141*  --  142*  BUN 24* 30*  --  22  CREATININE 1.99* 1.90*  --  1.80*  CALCIUM 9.0  --   --  9.0   GFR: Estimated Creatinine Clearance: 41.9 mL/min (A) (by C-G formula based on SCr of 1.8 mg/dL (H)). Liver Function Tests: Recent Labs  Lab 09/05/20 2319  AST 15  ALT 10  ALKPHOS 86  BILITOT 1.7*  PROT 7.4  ALBUMIN 3.7   No results for input(s): LIPASE, AMYLASE in the last 168 hours. No results for input(s): AMMONIA in the last 168 hours. Coagulation Profile: Recent Labs  Lab 09/05/20 2319  INR 2.2*   Cardiac Enzymes: No results for input(s): CKTOTAL, CKMB, CKMBINDEX, TROPONINI in the last 168 hours. BNP (last 3 results) No results for input(s): PROBNP in the last 8760 hours. HbA1C: No results for  input(s): HGBA1C in the last 72 hours. CBG: No results for input(s): GLUCAP in the last 168 hours. Lipid Profile: No results for input(s): CHOL, HDL, LDLCALC, TRIG, CHOLHDL, LDLDIRECT in the last 72 hours. Thyroid Function Tests: No results for input(s): TSH, T4TOTAL, FREET4, T3FREE, THYROIDAB in the last 72 hours. Anemia Panel: No results for input(s): VITAMINB12, FOLATE, FERRITIN, TIBC, IRON, RETICCTPCT in the last 72 hours. Sepsis Labs: Recent Labs  Lab 09/05/20 2319 09/06/20 0513  PROCALCITON  --  0.11  LATICACIDVEN 0.8  --     Recent Results (from the past 240 hour(s))  SARS Coronavirus 2 by RT PCR (hospital order, performed in [facility] hospital lab) Nasopharyngeal Nasopharyngeal Swab     Status: None   Collection Time: 09/05/20 11:35 PM   Specimen: Nasopharyngeal Swab  Result Value Ref Range Status   SARS Coronavirus  2 NEGATIVE NEGATIVE Final    Comment: (NOTE) SARS-CoV-2 target nucleic acids are NOT DETECTED.  The SARS-CoV-2 RNA is generally detectable in upper and lower respiratory specimens during the acute phase of infection. The lowest concentration of SARS-CoV-2 viral copies this assay can detect is 250 copies / mL. A negative result does not preclude SARS-CoV-2 infection and should not be used as the sole basis for treatment or other patient management decisions.  A negative result may occur with improper specimen collection / handling, submission of specimen other than nasopharyngeal swab, presence of viral mutation(s) within the areas targeted by this assay, and inadequate number of viral copies (<250 copies / mL). A negative result must be combined with clinical observations, patient history, and epidemiological information.  Fact Sheet for Patients:   StrictlyIdeas.no  Fact Sheet for Healthcare Providers: BankingDealers.co.za  This test is not yet approved or  cleared by the [place] FDA and has been  authorized for detection and/or diagnosis of SARS-CoV-2 by FDA under an Emergency Use Authorization (EUA).  This EUA will remain in effect (meaning this test can be used) for the duration of the COVID-19 declaration under Section 564(b)(1) of the Act, 21 U.S.C. section 360bbb-3(b)(1), unless the authorization is terminated or revoked sooner.  Performed at [facility], [place] [address], [place], [place] [address]          Radiology Studies: DG Chest Port 1 View  Result Date: 09/05/2020 CLINICAL DATA:  Shortness of breath EXAM: PORTABLE CHEST 1 VIEW COMPARISON:  04/30/2020 FINDINGS: Moderate cardiomegaly with small pleural effusions and parahilar opacities, likely pulmonary edema. Remote median sternotomy with aortic stent. IMPRESSION: Cardiomegaly, pleural effusions and mild pulmonary edema. Electronically Signed   By: Ulyses Jarred M.D.   On: 09/05/2020 23:19        Scheduled Meds: . apixaban  5 mg Oral BID  . escitalopram  10 mg Oral Daily  . furosemide  20 mg Intravenous Q8H  . ipratropium-albuterol  3 mL Nebulization Q6H  . metoprolol tartrate  12.5 mg Oral BID  . rosuvastatin  5 mg Oral Daily   Continuous Infusions:   LOS: 0 days    Time spent: More than 50% of that time was spent in counseling and/or coordination of care.      Shelly Coss, MD [facility] P1/28/2022, 2:12 PM

## 2020-09-07 ENCOUNTER — Encounter (HOSPITAL_COMMUNITY): Payer: Self-pay | Admitting: Internal Medicine

## 2020-09-07 LAB — BASIC METABOLIC PANEL
Anion gap: 11 (ref 5–15)
BUN: 32 mg/dL — ABNORMAL HIGH (ref 8–23)
CO2: 25 mmol/L (ref 22–32)
Calcium: 9 mg/dL (ref 8.9–10.3)
Chloride: 93 mmol/L — ABNORMAL LOW (ref 98–111)
Creatinine, Ser: 1.77 mg/dL — ABNORMAL HIGH (ref 0.61–1.24)
GFR, Estimated: 41 mL/min — ABNORMAL LOW (ref >60)
Glucose, Bld: 173 mg/dL — ABNORMAL HIGH (ref 70–99)
Potassium: 3.7 mmol/L (ref 3.5–5.1)
Sodium: 129 mmol/L — ABNORMAL LOW (ref 135–145)

## 2020-09-07 LAB — MAGNESIUM: Magnesium: 1.9 mg/dL (ref 1.7–2.4)

## 2020-09-07 MED ORDER — GUAIFENESIN ER 600 MG PO TB12
600.0000 mg | ORAL_TABLET | Freq: Two times a day (BID) | ORAL | Status: DC
  Administered 2020-09-07 – 2020-09-08 (×3): 600 mg via ORAL
  Filled 2020-09-07 (×3): qty 1

## 2020-09-07 MED ORDER — FUROSEMIDE 10 MG/ML IJ SOLN: 20.0000 mg | Freq: Once | INTRAMUSCULAR | Status: DC

## 2020-09-07 MED ORDER — FLECAINIDE ACETATE 50 MG PO TABS
75.0000 mg | ORAL_TABLET | Freq: Two times a day (BID) | ORAL | Status: DC
  Administered 2020-09-07 – 2020-09-08 (×2): 75 mg via ORAL
  Filled 2020-09-07 (×3): qty 2

## 2020-09-07 MED ORDER — IPRATROPIUM-ALBUTEROL 0.5-2.5 (3) MG/3ML IN SOLN
3.0000 mL | Freq: Three times a day (TID) | RESPIRATORY_TRACT | Status: DC
  Administered 2020-09-07 – 2020-09-08 (×4): 3 mL via RESPIRATORY_TRACT
  Filled 2020-09-07 (×4): qty 3

## 2020-09-07 MED ORDER — FUROSEMIDE 10 MG/ML IJ SOLN
40.0000 mg | Freq: Two times a day (BID) | INTRAMUSCULAR | Status: DC
  Administered 2020-09-07 – 2020-09-08 (×2): 40 mg via INTRAVENOUS
  Filled 2020-09-07 (×2): qty 4

## 2020-09-07 MED ORDER — POTASSIUM CHLORIDE CRYS ER 10 MEQ PO TBCR
40.0000 meq | EXTENDED_RELEASE_TABLET | Freq: Once | ORAL | Status: AC
  Administered 2020-09-07: 40 meq via ORAL
  Filled 2020-09-07: qty 4

## 2020-09-07 NOTE — Progress Notes (Addendum)
Notified by CCMD that patient is sustaining HR in the 160s. Patient sitting on edge of bed with now complaints. Heart rate ausculted is very irregular but rate counted is around 115. 12 Lead taken but patient rate had slowed significantly. MD paged about episode of SVT  6:42 PM Patient continues to have runs of SVT with rate in 160-170s for intervals of 30-90 seconds. Flicainide not resumed initially during admission. Dose schedule for 2000 given early as noted in the [facility]

## 2020-09-07 NOTE — Progress Notes (Addendum)
PROGRESS NOTE    Sean Riley  [id number] DOB: 11-27-1950 DOA: 09/05/2020 PCP: Colon Branch, MD   Chief Complain: Shortness of breath  Brief Narrative: Patient is a 70 year old male with history of permanent A. 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  He was also noted to be altered at home.  On presentation he was saturating 77% on room air.  Chest x-ray showed cardiomegaly, pulmonary edema, bilateral pleural effusions.  BNP was greater than 300.  Covid screen test were negative.  Had mild grade fever on presentation.  Mild elevated troponin.  Patient was admitted for the management of acute on chronic diastolic heart failure  for exacerbation.  Started on IV diuresis  Assessment & Plan:   Active Problems:   Acute respiratory failure with hypoxia (HCC)   Acute on chronic hypoxic respiratory failure: On 2 to 3 L of oxygen at home.  Needed 10 L of oxygen to maintain saturation on presentation.  This is most likely secondary to pulmonary edema, pleural effusions. Respiratory status is improving and he is down to 3 L which is close to his baseline. continue bronchodilators, incentive spirometer.  Acute on chronic diastolic congestive heart failure: Evidence of volume overload as per the chest imagings.  Elevated BNP.  2D echo done on this admission showed ejection fraction of 60-65%.continue diuresis.  Continue to monitor daily weight, input/output .takes torsemide at home. Follows with Dr Aundra Dubin  Elevated troponin: Denies any chest pain.  Suspected to be from supply demand ischemia.  No evidence of acute ischemia as per twelve-lead EKG.  Monitor on telemetry  COPD: On 2 to 3 L of oxygen at home.  Resume home regimen.  He was also given a dose of  IV steroid for wheezing.  No wheezing auscultated today.He follows with Dr Vaughan Browner  Mild leukocytosis: Currently stable.  Reported to have fever at home.  Afebrile now.  Urinalysis did not show any evidence of urinary tract infection.  Normal procalcitonin level.  Impaired glucose intolerance: Continue sliding-scale insulin.  Takes Jardiance at home.    Permanent A. fib: On metoprolol for rate control.  On Eliquis for anticoagulation.Follows with Dr Caryl Comes.Also take flecainide  Chronic anxiety/depression: Continue home regimen  CKD stage IIIb: Currently kidney function at baseline.  Baseline creatinine 1.9.  Avoid nephrotoxins.  Hyperlipidemia: Continue home regimen  Hypertension: On metoprolol and losartan at home.  Continue to monitor blood pressure  BPH: As per wife ,he has symptoms of LUTS. Started on flomax,he needs to follow up with urology as an outpatient.  Hyponatremia: Most likely secondary to hypervolemic hyponatremia secondary to volume overload.  Continue diuresis and monitor.Na is 129 today  Deconditioning/debility: PT recommended home health  on discharge           DVT prophylaxis:Eliquis Code Status: None Family Communication: Talked and discussed with wife on phone on 09/07/20 Status is: Inpatient  Remains inpatient appropriate because:Inpatient level of care appropriate due to severity of illness   Dispo:  Patient From: Home  Planned Disposition: Home  Expected discharge date: 09/08/2020  Medically stable for discharge: No   Still needs IV diuresis.  Possible plan for tomorrow   Consultants: None  Procedures:None  Antimicrobials:  Anti-infectives (From admission, onward)   None  Subjective: Patient seen and examined the bedside this morning.  He was down to 3 L of oxygen per minute.  Looks better.  Denies any shortness of breath but is bothered by some cough.  Having good urine output.  Objective: Vitals:   09/07/20 0000 09/07/20 0137  09/07/20 0421 09/07/20 0712  BP: 114/65  131/67 (!) 121/58  Pulse: 82  75 79  Resp: 18  20 20   Temp: 97.8 F (36.6 C)  97.9 F (36.6 C) 97.7 F (36.5 C)  TempSrc: Oral  Oral Oral  SpO2: 95%  (!) 89% 90%  Weight:  87.7 kg    Height:        Intake/Output Summary (Last 24 hours) at 09/07/2020 0753 Last data filed at 09/07/2020 4650 Gross per 24 hour  Intake -  Output 375 ml  Net -375 ml   Filed Weights   09/05/20 2308 09/06/20 1915 09/07/20 0137  Weight: 90.7 kg 87.8 kg 87.7 kg    Examination:  General exam: Not in distress,average built,deconditioned HEENT:PERRL,Oral mucosa moist, Ear/Nose normal on gross exam Respiratory system: Bilateral mildly diminished  breath sounds, no wheezes or crackles  Cardiovascular system: S1 & S2 heard, RRR. No JVD, murmurs, rubs, gallops or clicks. Gastrointestinal system: Abdomen is nondistended, soft and nontender. No organomegaly or masses felt. Normal bowel sounds heard. Central nervous system: Alert and oriented. No focal neurological deficits. Extremities: No edema, no clubbing ,no cyanosis Skin: No rashes, lesions or ulcers,no icterus ,no pallor   Data Reviewed: I have personally reviewed following labs and imaging studies  CBC: Recent Labs  Lab 09/05/20 2319 09/05/20 2345 09/05/20 2346 09/06/20 0513  WBC 12.0*  --   --  10.4  NEUTROABS 10.4*  --   --   --   HGB 13.4 14.3 13.9 13.7  HCT 41.4 42.0 41.0 40.9  MCV 95.4  --   --  94.0  PLT 186  --   --  354   Basic Metabolic Panel: Recent Labs  Lab 09/05/20 2319 09/05/20 2345 09/05/20 2346 09/06/20 0513 09/07/20 0032  NA 132* 133* 133* 132* 129*  K 4.3 4.5 4.5 4.2 3.7  CL 95* 94*  --  94* 93*  CO2 25  --   --  25 25  GLUCOSE 144* 141*  --  142* 173*  BUN 24* 30*  --  22 32*  CREATININE 1.99* 1.90*  --  1.80* 1.77*  CALCIUM 9.0  --   --  9.0 9.0   GFR: Estimated Creatinine Clearance: 42.6 mL/min (A) (by C-G formula based on SCr of 1.77 mg/dL (H)). Liver Function  Tests: Recent Labs  Lab 09/05/20 2319  AST 15  ALT 10  ALKPHOS 86  BILITOT 1.7*  PROT 7.4  ALBUMIN 3.7   No results for input(s): LIPASE, AMYLASE in the last 168 hours. No results for input(s): AMMONIA in the last 168 hours. Coagulation Profile: Recent Labs  Lab 09/05/20 2319  INR 2.2*   Cardiac Enzymes: No results for input(s): CKTOTAL, CKMB, CKMBINDEX, TROPONINI in the last 168 hours. BNP (last 3 results) No results for input(s): PROBNP in the last 8760 hours. HbA1C: No results for input(s): HGBA1C in the last 72 hours. CBG: No results for input(s): GLUCAP in the last 168 hours. Lipid Profile: No results for input(s): CHOL, HDL, LDLCALC, TRIG, CHOLHDL, LDLDIRECT in the last 72 hours. Thyroid Function Tests: No results for input(s): TSH, T4TOTAL, FREET4, T3FREE, THYROIDAB in the last 72 hours. Anemia Panel: No results for  input(s): VITAMINB12, FOLATE, FERRITIN, TIBC, IRON, RETICCTPCT in the last 72 hours. Sepsis Labs: Recent Labs  Lab 09/05/20 2319 09/06/20 0513  PROCALCITON  --  0.11  LATICACIDVEN 0.8  --     Recent Results (from the past 240 hour(s))  SARS Coronavirus 2 by RT PCR (hospital order, performed in [facility] hospital lab) Nasopharyngeal Nasopharyngeal Swab     Status: None   Collection Time: 09/05/20 11:35 PM   Specimen: Nasopharyngeal Swab  Result Value Ref Range Status   SARS Coronavirus 2 NEGATIVE NEGATIVE Final    Comment: (NOTE) SARS-CoV-2 target nucleic acids are NOT DETECTED.  The SARS-CoV-2 RNA is generally detectable in upper and lower respiratory specimens during the acute phase of infection. The lowest concentration of SARS-CoV-2 viral copies this assay can detect is 250 copies / mL. A negative result does not preclude SARS-CoV-2 infection and should not be used as the sole basis for treatment or other patient management decisions.  A negative result may occur with improper specimen collection / handling, submission of specimen  other than nasopharyngeal swab, presence of viral mutation(s) within the areas targeted by this assay, and inadequate number of viral copies (<250 copies / mL). A negative result must be combined with clinical observations, patient history, and epidemiological information.  Fact Sheet for Patients:   StrictlyIdeas.no  Fact Sheet for Healthcare Providers: BankingDealers.co.za  This test is not yet approved or  cleared by the [place] FDA and has been authorized for detection and/or diagnosis of SARS-CoV-2 by FDA under an Emergency Use Authorization (EUA).  This EUA will remain in effect (meaning this test can be used) for the duration of the COVID-19 declaration under Section 564(b)(1) of the Act, 21 U.S.C. section 360bbb-3(b)(1), unless the authorization is terminated or revoked sooner.  Performed at [facility], [place] [address], [place], [place] [address]          Radiology Studies: DG Chest Port 1 View  Result Date: 09/05/2020 CLINICAL DATA:  Shortness of breath EXAM: PORTABLE CHEST 1 VIEW COMPARISON:  04/30/2020 FINDINGS: Moderate cardiomegaly with small pleural effusions and parahilar opacities, likely pulmonary edema. Remote median sternotomy with aortic stent. IMPRESSION: Cardiomegaly, pleural effusions and mild pulmonary edema. Electronically Signed   By: Ulyses Jarred M.D.   On: 09/05/2020 23:19   ECHOCARDIOGRAM COMPLETE  Result Date: 09/06/2020    ECHOCARDIOGRAM REPORT   Patient Name:   Sean Riley Date of Exam: 09/06/2020 Medical Rec #:  [id number]        Height:       72.0 in Accession #:    [id number]       Weight:       200.0 lb Date of Birth:  08-Feb-1951        BSA:          2.131 m Patient Age:    51 years         BP:           130/78 mmHg Patient Gender: M                HR:           86 bpm. Exam Location:  Inpatient Procedure: 2D Echo, Color Doppler and Cardiac Doppler Indications:    CHF-Acute Diastolic  H41.93  History:        Patient has prior history of Echocardiogram examinations, most                 recent  12/13/2019. CHF, Arrythmias:Atrial Fibrillation; Risk                 Factors:Hypertension and Dyslipidemia.                 Aortic Valve: 25 mm Edwards bovine valve is present in the                 aortic position. Procedure Date: 03/23/2017.  Sonographer:    Bernadene Person RDCS Referring Phys: [id number] [place]  1. Left ventricular ejection fraction, by estimation, is 60 to 65%. The left ventricle has normal function. The left ventricle has no regional wall motion abnormalities. There is mild left ventricular hypertrophy. Left ventricular diastolic function could not be evaluated.  2. Right ventricular systolic function is mildly reduced. The right ventricular size is moderately enlarged. There is mildly elevated pulmonary artery systolic pressure. The estimated right ventricular systolic pressure is [id number] mmHg.  3. Right atrial size was severely dilated.  4. The mitral valve is abnormal. Trivial mitral valve regurgitation.  5. The aortic valve has been repaired/replaced. Aortic valve regurgitation is not visualized. There is a 25 mm Edwards bovine valve present in the aortic position. Procedure Date: 03/23/2017. Echo findings are consistent with normal structure and function of the aortic valve prosthesis. Aortic valve area, by VTI measures 1.81 cm. Aortic valve mean gradient measures 18.3 mmHg. Aortic valve Vmax measures 2.93 m/s.  6. Aortic root/ascending aorta has been repaired/replaced.  7. The inferior vena cava is normal in size with greater than 50% respiratory variability, suggesting right atrial pressure of 3 mmHg. Comparison(s): Changes from prior study are noted. 12/13/2019: LVEF 55%, RVSP 45 mmHg, AVR mean gradient 17 mmHg, aortic root replacement. FINDINGS  Left Ventricle: Left ventricular ejection fraction, by estimation, is 60 to 65%. The left ventricle has normal function. The  left ventricle has no regional wall motion abnormalities. The left ventricular internal cavity size was normal in size. There is  mild left ventricular hypertrophy. Left ventricular diastolic function could not be evaluated due to nondiagnostic images. Left ventricular diastolic function could not be evaluated. Right Ventricle: The right ventricular size is moderately enlarged. Right vetricular wall thickness was not well visualized. Right ventricular systolic function is mildly reduced. There is mildly elevated pulmonary artery systolic pressure. The tricuspid  regurgitant velocity is 3.19 m/s, and with an assumed right atrial pressure of 3 mmHg, the estimated right ventricular systolic pressure is Q000111Q mmHg. Left Atrium: Left atrial size was normal in size. Right Atrium: Right atrial size was severely dilated. Pericardium: There is no evidence of pericardial effusion. Mitral Valve: The mitral valve is abnormal. Mild mitral annular calcification. Trivial mitral valve regurgitation. Tricuspid Valve: The tricuspid valve is grossly normal. Tricuspid valve regurgitation is trivial. Aortic Valve: The aortic valve has been repaired/replaced. Aortic valve regurgitation is not visualized. Aortic valve mean gradient measures 18.3 mmHg. Aortic valve peak gradient measures 34.4 mmHg. Aortic valve area, by VTI measures 1.81 cm. There is a  25 mm Edwards bovine valve present in the aortic position. Procedure Date: 03/23/2017. Echo findings are consistent with normal structure and function of the aortic valve prosthesis. Pulmonic Valve: The pulmonic valve was normal in structure. Pulmonic valve regurgitation is not visualized. Aorta: The aortic root/ascending aorta has been repaired/replaced. Venous: The inferior vena cava is normal in size with greater than 50% respiratory variability, suggesting right atrial pressure of 3 mmHg. IAS/Shunts: The interatrial septum was not well visualized.  LEFT VENTRICLE PLAX  2D LVIDd:          5.60 cm LVIDs:         3.50 cm LV PW:         1.00 cm LV IVS:        1.10 cm LVOT diam:     2.10 cm LV SV:         104 LV SV Index:   49 LVOT Area:     3.46 cm  RIGHT VENTRICLE RV S prime:     9.57 cm/s TAPSE (M-mode): 1.9 cm LEFT ATRIUM             Index       RIGHT ATRIUM           Index LA diam:        3.40 cm 1.60 cm/m  RA Area:     28.90 cm LA Vol (A2C):   57.0 ml 26.75 ml/m RA Volume:   103.00 ml 48.34 ml/m LA Vol (A4C):   50.9 ml 23.89 ml/m LA Biplane Vol: 54.5 ml 25.58 ml/m  AORTIC VALVE AV Area (Vmax):    1.88 cm AV Area (Vmean):   2.01 cm AV Area (VTI):     1.81 cm AV Vmax:           293.33 cm/s AV Vmean:          196.000 cm/s AV VTI:            0.574 m AV Peak Grad:      34.4 mmHg AV Mean Grad:      18.3 mmHg LVOT Vmax:         159.50 cm/s LVOT Vmean:        113.500 cm/s LVOT VTI:          0.300 m LVOT/AV VTI ratio: 0.52  AORTA Ao Root diam: 3.50 cm Ao Asc diam:  3.10 cm TRICUSPID VALVE TR Peak grad:   40.7 mmHg TR Vmax:        319.00 cm/s  SHUNTS Systemic VTI:  0.30 m Systemic Diam: 2.10 cm Lyman Bishop MD Electronically signed by Lyman Bishop MD Signature Date/Time: 09/06/2020/2:41:50 PM    Final         Scheduled Meds: . apixaban  5 mg Oral BID  . escitalopram  10 mg Oral Daily  . furosemide  20 mg Intravenous Q8H  . ipratropium-albuterol  3 mL Nebulization TID  . metoprolol tartrate  12.5 mg Oral BID  . rosuvastatin  5 mg Oral Daily  . tamsulosin  0.4 mg Oral Daily   Continuous Infusions:   LOS: 1 day    Time spent: 25 mins.More than 50% of that time was spent in counseling and/or coordination of care.      Shelly Coss, MD [facility] P1/29/2022, 7:53 AM

## 2020-09-08 ENCOUNTER — Inpatient Hospital Stay (HOSPITAL_COMMUNITY): Payer: Medicare Other

## 2020-09-08 LAB — BASIC METABOLIC PANEL
Anion gap: 11 (ref 5–15)
BUN: 26 mg/dL — ABNORMAL HIGH (ref 8–23)
CO2: 28 mmol/L (ref 22–32)
Calcium: 8.8 mg/dL — ABNORMAL LOW (ref 8.9–10.3)
Chloride: 91 mmol/L — ABNORMAL LOW (ref 98–111)
Creatinine, Ser: 1.52 mg/dL — ABNORMAL HIGH (ref 0.61–1.24)
GFR, Estimated: 49 mL/min — ABNORMAL LOW (ref >60)
Glucose, Bld: 104 mg/dL — ABNORMAL HIGH (ref 70–99)
Potassium: 3.5 mmol/L (ref 3.5–5.1)
Sodium: 130 mmol/L — ABNORMAL LOW (ref 135–145)

## 2020-09-08 MED ORDER — AZITHROMYCIN 500 MG PO TABS: 500.0000 mg | ORAL_TABLET | Freq: Every day | ORAL | 0 refills | Status: AC

## 2020-09-08 MED ORDER — PREDNISONE 20 MG PO TABS
40.0000 mg | ORAL_TABLET | Freq: Every day | ORAL | Status: DC
  Administered 2020-09-08: 40 mg via ORAL
  Filled 2020-09-08: qty 2

## 2020-09-08 MED ORDER — POTASSIUM CHLORIDE CRYS ER 20 MEQ PO TBCR
40.0000 meq | EXTENDED_RELEASE_TABLET | Freq: Once | ORAL | Status: AC
  Administered 2020-09-08: 40 meq via ORAL
  Filled 2020-09-08: qty 2

## 2020-09-08 MED ORDER — TAMSULOSIN HCL 0.4 MG PO CAPS: 0.4000 mg | ORAL_CAPSULE | Freq: Every day | ORAL | 1 refills | Status: DC

## 2020-09-08 MED ORDER — PREDNISONE 20 MG PO TABS: 40.0000 mg | ORAL_TABLET | Freq: Every day | ORAL | 0 refills | Status: AC

## 2020-09-08 MED ORDER — BUDESONIDE 0.5 MG/2ML IN SUSP
0.5000 mg | Freq: Two times a day (BID) | RESPIRATORY_TRACT | Status: DC
  Administered 2020-09-08: 0.5 mg via RESPIRATORY_TRACT
  Filled 2020-09-08: qty 2

## 2020-09-08 MED ORDER — METOPROLOL TARTRATE 25 MG PO TABS: 12.5000 mg | ORAL_TABLET | Freq: Two times a day (BID) | ORAL | 1 refills | Status: AC

## 2020-09-08 MED ORDER — TORSEMIDE 20 MG PO TABS: 40.0000 mg | ORAL_TABLET | Freq: Once | ORAL | Status: DC

## 2020-09-08 MED ORDER — GUAIFENESIN ER 600 MG PO TB12: 1200.0000 mg | ORAL_TABLET | Freq: Two times a day (BID) | ORAL | 0 refills | Status: AC

## 2020-09-08 NOTE — Discharge Summary (Signed)
Physician Discharge Summary  Sean Riley [id number] DOB: March 17, 1951 DOA: 09/05/2020  PCP: Colon Branch, MD  Admit date: 09/05/2020 Discharge date: 09/08/2020  Admitted From: Home Disposition:  Home  Discharge Condition:Stable CODE STATUS:FULL Diet recommendation: Heart Healthy   Brief/Interim Summary:  Patient is a 70 year old male with history of permanent A. 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  He was also noted to be altered at home.  On presentation he was saturating 77% on room air.  Chest x-ray showed cardiomegaly, pulmonary edema, bilateral pleural effusions.  BNP was greater than 300.  Covid screen test were negative.  Had mild grade fever on presentation.  Mild elevated troponin.  Patient was admitted for the management of acute on chronic diastolic heart failure  for exacerbation.  Started on IV diuresis.  He had a good diuresis during this hospitalization.  Respiratory status is currently stable and is on baseline oxygen requirement.  He is medically stable for discharge home today.  Following problems were addressed during his hospitalization:   Acute on chronic hypoxic respiratory failure: On 2 to 3 L of oxygen at home.  Needed 10 L of oxygen to maintain saturation on presentation.  This is most likely secondary to pulmonary edema, pleural effusions. Respiratory status is improving and he is down to 3 L which is close to his baseline.  Acute on chronic diastolic congestive heart failure: Evidence of volume overload as per the chest imagings.  Elevated BNP.  2D echo done on this admission showed ejection fraction of 60-65%..takes torsemide at home. Follows with Dr Aundra Dubin.  Patient has been instructed to restrict fluid intake to  less than 1.2 L a day, salt intake to less than 2 g a day and monitoring weight at home.  Elevated troponin: Denies any chest pain.  Suspected to be from supply demand ischemia.  No evidence of acute ischemia as per twelve-lead EKG.  COPD: On 2 to 3 L of oxygen at home.  Resume home regimen.    Found to have some expiratory wheezes today.  Started on prednisone.  Also has yellow sputum, started on azithromycin for 3 days course, continue Mucinex.  He follows with Dr Vaughan Browner  Mild leukocytosis: Currently stable.  Reported to have fever at home.  Afebrile now.  Urinalysis did not show any evidence of urinary tract infection.  Normal procalcitonin level.  Impaired glucose intolerance:   Takes Jardiance at home.    Permanent A. fib: On metoprolol for rate control.  On Eliquis for anticoagulation.Follows with Dr Caryl Comes.Also take flecainide.  Dose of metoprolol decreased due to soft blood pressure  Chronic anxiety/depression: Continue home regimen  CKD stage IIIb: Currently kidney function at baseline.  Baseline creatinine 1.9.  Hyperlipidemia: Continue home regimen  Hypertension: On metoprolol and losartan at home.  Continue to monitor blood pressure  BPH: As per wife ,he has symptoms of LUTS. Started on flomax,he needs to follow up with urology as an outpatient.  Hyponatremia: Most likely secondary to hypervolemic hyponatremia secondary to volume overload on presentation.    Check BMP in a week.  Deconditioning/debility: PT recommended home health  on discharge    Discharge Diagnoses:  Active Problems:   Acute respiratory failure with hypoxia [facility]    Discharge Instructions  Discharge  Instructions    Diet - low sodium heart healthy   Complete by: As directed    Discharge instructions   Complete by: As directed    1)Please follow up with your PCP in a week.  Do a CBC, BMP tests during  the follow-up 2)Take prescribed medications as instructed 3)Restrict fluid intake  to less than 1.2 L a day and salt intake to less than 2 g a day.Monitor your weight 4)Follow up with your cardiologists and pulmonologist.   Increase activity slowly   Complete by: As directed      Allergies as of 09/08/2020      Reactions   Simvastatin Other (See Comments)   Mental status changes   Celecoxib Other (See Comments)   GI UPSET AND INFLAMMATION   Codeine Other (See Comments)   GI UPSET AND INFLAMMATION   Nsaids Other (See Comments)   GI UPSET AND INFLAMMATION (can tolerate via IV)   Tape Other (See Comments)   Medical tape and Band-Aids PULL OFF THE SKIN; please use Coban wrap   Cephalexin Itching, Rash   Latex Rash      Medication List    TAKE these medications   albuterol 108 (90 Base) MCG/ACT inhaler Commonly known as: ProAir HFA Inhale 2 puffs into the lungs every 4 (four) hours as needed for wheezing or shortness of breath.   albuterol (2.5 MG/3ML) 0.083% nebulizer solution Commonly known as: PROVENTIL Take 3 mLs (2.5 mg total) by nebulization every 8 (eight) hours as needed for wheezing or shortness of breath.   ALPRAZolam 1 MG tablet Commonly known as: XANAX TAKE 1-1&1/2 TABLETS BY MOUTH AT BEDTIME What changed: See the new instructions.   apixaban 5 MG Tabs tablet Commonly known as: Eliquis Take 1 tablet (5 mg total) by mouth 2 (two) times daily.   augmented betamethasone dipropionate 0.05 % cream Commonly known as: DIPROLENE-AF Apply topically 2 (two) times daily as needed. What changed:   how much to take  reasons to take this   azithromycin 500 MG tablet Commonly known as: Zithromax Take 1 tablet (500 mg total) by mouth daily for 3 days. Take 1 tablet daily for 3 days.   bisacodyl 5 MG EC tablet Commonly known as: DULCOLAX Take 5 mg by mouth daily as needed for moderate constipation.   diphenhydrAMINE 25 MG tablet Commonly known as: BENADRYL Take 25 mg by mouth every 8 (eight) hours as needed (allergies/runny nose).   empagliflozin  10 MG Tabs tablet Commonly known as: Jardiance Take 1 tablet (10 mg total) by mouth daily before breakfast.   escitalopram 10 MG tablet Commonly known as: LEXAPRO Take 1 tablet (10 mg total) by mouth daily.   esomeprazole 40 MG capsule Commonly known as: NexIUM Take 1 capsule (40 mg total) by mouth daily before breakfast.   flecainide 50 MG tablet Commonly known as: TAMBOCOR TAKE ONE AND ONE-HALF TABLETS TWICE A DAY What changed:   how much to take  how to take this  when to take this  additional instructions   fluticasone 50 MCG/ACT nasal spray Commonly known as: FLONASE Place 1 spray into both nostrils 2 (two) times daily as needed for allergies. Use one spray in each nostril twice daily. What changed:   reasons to take this  additional instructions   guaiFENesin 600 MG 12 hr tablet Commonly known as: MUCINEX Take 2 tablets (1,200 mg total) by mouth 2 (two) times daily for 7 days.   ipratropium-albuterol 0.5-2.5 (3) MG/3ML Soln Commonly  known as: DUONEB Take 3 mLs by nebulization 4 (four) times daily.   Klor-Con M20 20 MEQ tablet Generic drug: potassium chloride SA TAKE 2 TABLETS (40 MEQ) DAILY What changed: See the new instructions.   losartan 25 MG tablet Commonly known as: COZAAR Take 1 tablet (25 mg total) by mouth daily.   metoprolol tartrate 25 MG tablet Commonly known as: LOPRESSOR Take 0.5 tablets (12.5 mg total) by mouth 2 (two) times daily. Please make yearly appt with Dr. Caryl Comes for January 2022 for future refills. Thank you 1st attempt What changed: how much to take   OXYGEN Inhale 2 L/min into the lungs as needed (for shortness of breath).   predniSONE 20 MG tablet Commonly known as: DELTASONE Take 2 tablets (40 mg total) by mouth daily with breakfast for 4 days. Start taking on: September 09, 2020   rosuvastatin 5 MG tablet Commonly known as: CRESTOR Take 1 tablet (5 mg total) by mouth daily. What changed: when to take this    tamsulosin 0.4 MG Caps capsule Commonly known as: FLOMAX Take 1 capsule (0.4 mg total) by mouth daily. Start taking on: September 09, 2020   torsemide 20 MG tablet Commonly known as: DEMADEX Take 40 mg (2 tabs) every other day ALTERNATING with 60 mg (3 tabs) every other day What changed:   how much to take  how to take this  when to take this  additional instructions            Durable Medical Equipment  (From admission, onward)         Start     Ordered   09/08/20 1320  For home use only DME 3 n 1  Once        09/08/20 1319          Follow-up Information    Colon Branch, MD. Schedule an appointment as soon as possible for a visit in 1 week(s).   Specialty: Internal Medicine Contact information: [phone] [address] [facility] [address] [place] [address] [phone]              Allergies  Allergen Reactions  . Simvastatin Other (See Comments)    Mental status changes  . Celecoxib Other (See Comments)    GI UPSET AND INFLAMMATION  . Codeine Other (See Comments)    GI UPSET AND INFLAMMATION  . Nsaids Other (See Comments)    GI UPSET AND INFLAMMATION (can tolerate via IV)  . Tape Other (See Comments)    Medical tape and Band-Aids PULL OFF THE SKIN; please use Coban wrap  . Cephalexin Itching and Rash  . Latex Rash    Consultations:  None   Procedures/Studies: DG CHEST PORT 1 VIEW  Result Date: 09/08/2020 CLINICAL DATA:  Short of breath EXAM: PORTABLE CHEST 1 VIEW COMPARISON:  09/05/2020 FINDINGS: Median sternotomy with aortic valve replacement. Stent graft in the aortic arch and descending thoracic aorta. Stent graft unchanged. Improvement in perihilar edema. Mild to moderate bilateral pleural effusions unchanged. Bibasilar airspace disease with slight interval improvement. IMPRESSION: Improvement in perihilar edema and bibasilar airspace disease. No change bilateral pleural effusions. Electronically Signed   By: Franchot Gallo M.D.   On:  09/08/2020 12:28   DG Chest Port 1 View  Result Date: 09/05/2020 CLINICAL DATA:  Shortness of breath EXAM: PORTABLE CHEST 1 VIEW COMPARISON:  04/30/2020 FINDINGS: Moderate cardiomegaly with small pleural effusions and parahilar opacities, likely pulmonary edema. Remote median sternotomy with aortic stent. IMPRESSION: Cardiomegaly,  pleural effusions and mild pulmonary edema. Electronically Signed   By: Ulyses Jarred M.D.   On: 09/05/2020 23:19   ECHOCARDIOGRAM COMPLETE  Result Date: 09/06/2020    ECHOCARDIOGRAM REPORT   Patient Name:   Sean Riley Date of Exam: 09/06/2020 Medical Rec #:  [id number]        Height:       72.0 in Accession #:    [id number]       Weight:       200.0 lb Date of Birth:  06/22/1951        BSA:          2.131 m Patient Age:    30 years         BP:           130/78 mmHg Patient Gender: M                HR:           86 bpm. Exam Location:  Inpatient Procedure: 2D Echo, Color Doppler and Cardiac Doppler Indications:    CHF-Acute Diastolic [id number]  History:        Patient has prior history of Echocardiogram examinations, most                 recent 12/13/2019. CHF, Arrythmias:Atrial Fibrillation; Risk                 Factors:Hypertension and Dyslipidemia.                 Aortic Valve: 25 mm Edwards bovine valve is present in the                 aortic position. Procedure Date: 03/23/2017.  Sonographer:    Bernadene Person RDCS Referring Phys: [id number] [place]  1. Left ventricular ejection fraction, by estimation, is 60 to 65%. The left ventricle has normal function. The left ventricle has no regional wall motion abnormalities. There is mild left ventricular hypertrophy. Left ventricular diastolic function could not be evaluated.  2. Right ventricular systolic function is mildly reduced. The right ventricular size is moderately enlarged. There is mildly elevated pulmonary artery systolic pressure. The estimated right ventricular systolic pressure is Q000111Q mmHg.  3. Right  atrial size was severely dilated.  4. The mitral valve is abnormal. Trivial mitral valve regurgitation.  5. The aortic valve has been repaired/replaced. Aortic valve regurgitation is not visualized. There is a 25 mm Edwards bovine valve present in the aortic position. Procedure Date: 03/23/2017. Echo findings are consistent with normal structure and function of the aortic valve prosthesis. Aortic valve area, by VTI measures 1.81 cm. Aortic valve mean gradient measures 18.3 mmHg. Aortic valve Vmax measures 2.93 m/s.  6. Aortic root/ascending aorta has been repaired/replaced.  7. The inferior vena cava is normal in size with greater than 50% respiratory variability, suggesting right atrial pressure of 3 mmHg. Comparison(s): Changes from prior study are noted. 12/13/2019: LVEF 55%, RVSP 45 mmHg, AVR mean gradient 17 mmHg, aortic root replacement. FINDINGS  Left Ventricle: Left ventricular ejection fraction, by estimation, is 60 to 65%. The left ventricle has normal function. The left ventricle has no regional wall motion abnormalities. The left ventricular internal cavity size was normal in size. There is  mild left ventricular hypertrophy. Left ventricular diastolic function could not be evaluated due to nondiagnostic images. Left ventricular diastolic function could not be evaluated. Right Ventricle: The right ventricular size is moderately enlarged. Right vetricular wall  thickness was not well visualized. Right ventricular systolic function is mildly reduced. There is mildly elevated pulmonary artery systolic pressure. The tricuspid  regurgitant velocity is 3.19 m/s, and with an assumed right atrial pressure of 3 mmHg, the estimated right ventricular systolic pressure is Q000111Q mmHg. Left Atrium: Left atrial size was normal in size. Right Atrium: Right atrial size was severely dilated. Pericardium: There is no evidence of pericardial effusion. Mitral Valve: The mitral valve is abnormal. Mild mitral annular  calcification. Trivial mitral valve regurgitation. Tricuspid Valve: The tricuspid valve is grossly normal. Tricuspid valve regurgitation is trivial. Aortic Valve: The aortic valve has been repaired/replaced. Aortic valve regurgitation is not visualized. Aortic valve mean gradient measures 18.3 mmHg. Aortic valve peak gradient measures 34.4 mmHg. Aortic valve area, by VTI measures 1.81 cm. There is a  25 mm Edwards bovine valve present in the aortic position. Procedure Date: 03/23/2017. Echo findings are consistent with normal structure and function of the aortic valve prosthesis. Pulmonic Valve: The pulmonic valve was normal in structure. Pulmonic valve regurgitation is not visualized. Aorta: The aortic root/ascending aorta has been repaired/replaced. Venous: The inferior vena cava is normal in size with greater than 50% respiratory variability, suggesting right atrial pressure of 3 mmHg. IAS/Shunts: The interatrial septum was not well visualized.  LEFT VENTRICLE PLAX 2D LVIDd:         5.60 cm LVIDs:         3.50 cm LV PW:         1.00 cm LV IVS:        1.10 cm LVOT diam:     2.10 cm LV SV:         104 LV SV Index:   49 LVOT Area:     3.46 cm  RIGHT VENTRICLE RV S prime:     9.57 cm/s TAPSE (M-mode): 1.9 cm LEFT ATRIUM             Index       RIGHT ATRIUM           Index LA diam:        3.40 cm 1.60 cm/m  RA Area:     28.90 cm LA Vol (A2C):   57.0 ml 26.75 ml/m RA Volume:   103.00 ml 48.34 ml/m LA Vol (A4C):   50.9 ml 23.89 ml/m LA Biplane Vol: 54.5 ml 25.58 ml/m  AORTIC VALVE AV Area (Vmax):    1.88 cm AV Area (Vmean):   2.01 cm AV Area (VTI):     1.81 cm AV Vmax:           293.33 cm/s AV Vmean:          196.000 cm/s AV VTI:            0.574 m AV Peak Grad:      34.4 mmHg AV Mean Grad:      18.3 mmHg LVOT Vmax:         159.50 cm/s LVOT Vmean:        113.500 cm/s LVOT VTI:          0.300 m LVOT/AV VTI ratio: 0.52  AORTA Ao Root diam: 3.50 cm Ao Asc diam:  3.10 cm TRICUSPID VALVE TR Peak grad:   40.7 mmHg  TR Vmax:        319.00 cm/s  SHUNTS Systemic VTI:  0.30 m Systemic Diam: 2.10 cm Lyman Bishop MD Electronically signed by Lyman Bishop MD Signature Date/Time: 09/06/2020/2:41:50 PM    Final  Subjective: Patient seen and examined the bedside this morning.  Hemodynamically stable for discharge  Discharge Exam: Vitals:   09/08/20 0729 09/08/20 1019  BP: (!) 139/55 126/70  Pulse: 76 88  Resp: 20   Temp: 98.1 F (36.7 C)   SpO2: 91% (!) 88%   Vitals:   09/08/20 0231 09/08/20 0436 09/08/20 0729 09/08/20 1019  BP: 130/69 (!) 141/69 (!) 139/55 126/70  Pulse: 75 71 76 88  Resp:  16 20   Temp: (!) 97.5 F (36.4 C) (!) 97.4 F (36.3 C) 98.1 F (36.7 C)   TempSrc: Oral Oral Oral   SpO2: 94% 91% 91% (!) 88%  Weight:      Height:        General: Pt is alert, awake, not in acute distress Cardiovascular: RRR, S1/S2 +, no rubs, no gallops Respiratory: Mild bilateral expiratory wheezes  abdominal: Soft, NT, ND, bowel sounds + Extremities: no edema, no cyanosis    The results of significant diagnostics from this hospitalization (including imaging, microbiology, ancillary and laboratory) are listed below for reference.     Microbiology: Recent Results (from the past 240 hour(s))  Culture, blood (Routine x 2)     Status: None (Preliminary result)   Collection Time: 09/05/20 11:19 PM   Specimen: BLOOD LEFT ARM  Result Value Ref Range Status   Specimen Description BLOOD LEFT ARM  Final   Special Requests   Final    BOTTLES DRAWN AEROBIC AND ANAEROBIC Blood Culture adequate volume   Culture   Final    NO GROWTH 2 DAYS Performed at [facility], [address], [place], [place] [address]    Report Status PENDING  Incomplete  SARS Coronavirus 2 by RT PCR (hospital order, performed in [facility] hospital lab) Nasopharyngeal Nasopharyngeal Swab     Status: None   Collection Time: 09/05/20 11:35 PM   Specimen: Nasopharyngeal Swab  Result Value Ref Range Status   SARS  Coronavirus 2 NEGATIVE NEGATIVE Final    Comment: (NOTE) SARS-CoV-2 target nucleic acids are NOT DETECTED.  The SARS-CoV-2 RNA is generally detectable in upper and lower respiratory specimens during the acute phase of infection. The lowest concentration of SARS-CoV-2 viral copies this assay can detect is 250 copies / mL. A negative result does not preclude SARS-CoV-2 infection and should not be used as the sole basis for treatment or other patient management decisions.  A negative result may occur with improper specimen collection / handling, submission of specimen other than nasopharyngeal swab, presence of viral mutation(s) within the areas targeted by this assay, and inadequate number of viral copies (<250 copies / mL). A negative result must be combined with clinical observations, patient history, and epidemiological information.  Fact Sheet for Patients:   StrictlyIdeas.no  Fact Sheet for Healthcare Providers: BankingDealers.co.za  This test is not yet approved or  cleared by the [place] FDA and has been authorized for detection and/or diagnosis of SARS-CoV-2 by FDA under an Emergency Use Authorization (EUA).  This EUA will remain in effect (meaning this test can be used) for the duration of the COVID-19 declaration under Section 564(b)(1) of the Act, 21 U.S.C. section 360bbb-3(b)(1), unless the authorization is terminated or revoked sooner.  Performed at [facility], [place] [address] [place], [place] [address]   Culture, blood (Routine x 2)     Status: None (Preliminary result)   Collection Time: 09/05/20 11:55 PM   Specimen: BLOOD RIGHT ARM  Result Value Ref Range  Status   Specimen Description BLOOD RIGHT ARM  Final   Special Requests   Final    BOTTLES DRAWN AEROBIC AND ANAEROBIC Blood Culture adequate volume   Culture   Final    NO GROWTH 2 DAYS Performed at [facility], [address]  [place],  [address]    Report Status PENDING  Incomplete     Labs: BNP (last 3 results) Recent Labs    04/30/20 1226 06/04/20 1212 09/05/20 2332  BNP 125* 118.1* 704.8*   Basic Metabolic Panel: Recent Labs  Lab 09/05/20 2319 09/05/20 2345 09/05/20 2346 09/06/20 0513 09/07/20 0032 09/08/20 0234  NA 132* 133* 133* 132* 129* 130*  K 4.3 4.5 4.5 4.2 3.7 3.5  CL 95* 94*  --  94* 93* 91*  CO2 25  --   --  25 25 28   GLUCOSE 144* 141*  --  142* 173* 104*  BUN 24* 30*  --  22 32* 26*  CREATININE 1.99* 1.90*  --  1.80* 1.77* 1.52*  CALCIUM 9.0  --   --  9.0 9.0 8.8*  MG  --   --   --   --  1.9  --    Liver Function Tests: Recent Labs  Lab 09/05/20 2319  AST 15  ALT 10  ALKPHOS 86  BILITOT 1.7*  PROT 7.4  ALBUMIN 3.7   No results for input(s): LIPASE, AMYLASE in the last 168 hours. No results for input(s): AMMONIA in the last 168 hours. CBC: Recent Labs  Lab 09/05/20 2319 09/05/20 2345 09/05/20 2346 09/06/20 0513  WBC 12.0*  --   --  10.4  NEUTROABS 10.4*  --   --   --   HGB 13.4 14.3 13.9 13.7  HCT 41.4 42.0 41.0 40.9  MCV 95.4  --   --  94.0  PLT 186  --   --  195   Cardiac Enzymes: No results for input(s): CKTOTAL, CKMB, CKMBINDEX, TROPONINI in the last 168 hours. BNP: Invalid input(s): POCBNP CBG: No results for input(s): GLUCAP in the last 168 hours. D-Dimer No results for input(s): DDIMER in the last 72 hours. Hgb A1c No results for input(s): HGBA1C in the last 72 hours. Lipid Profile No results for input(s): CHOL, HDL, LDLCALC, TRIG, CHOLHDL, LDLDIRECT in the last 72 hours. Thyroid function studies No results for input(s): TSH, T4TOTAL, T3FREE, THYROIDAB in the last 72 hours.  Invalid input(s): FREET3 Anemia work up No results for input(s): VITAMINB12, FOLATE, FERRITIN, TIBC, IRON, RETICCTPCT in the last 72 hours. Urinalysis    Component Value Date/Time   COLORURINE YELLOW 09/06/2020 0145   APPEARANCEUR CLEAR 09/06/2020 0145   LABSPEC  1.008 09/06/2020 0145   PHURINE 5.0 09/06/2020 0145   GLUCOSEU NEGATIVE 09/06/2020 0145   HGBUR NEGATIVE 09/06/2020 0145   HGBUR small 04/16/2010 0807   BILIRUBINUR NEGATIVE 09/06/2020 0145   BILIRUBINUR Neg 04/21/2012 1414   KETONESUR NEGATIVE 09/06/2020 0145   PROTEINUR NEGATIVE 09/06/2020 0145   UROBILINOGEN 0.2 04/21/2012 1414   UROBILINOGEN negative 04/16/2010 0807   NITRITE NEGATIVE 09/06/2020 0145   LEUKOCYTESUR NEGATIVE 09/06/2020 0145   Sepsis Labs Invalid input(s): PROCALCITONIN,  WBC,  LACTICIDVEN Microbiology Recent Results (from the past 240 hour(s))  Culture, blood (Routine x 2)     Status: None (Preliminary result)   Collection Time: 09/05/20 11:19 PM   Specimen: BLOOD LEFT ARM  Result Value Ref Range Status   Specimen Description BLOOD LEFT ARM  Final   Special Requests  Final    BOTTLES DRAWN AEROBIC AND ANAEROBIC Blood Culture adequate volume   Culture   Final    NO GROWTH 2 DAYS Performed at [facility], [place] [address] [place], [place] [address]    Report Status PENDING  Incomplete  SARS Coronavirus 2 by RT PCR (hospital order, performed in [facility] hospital lab) Nasopharyngeal Nasopharyngeal Swab     Status: None   Collection Time: 09/05/20 11:35 PM   Specimen: Nasopharyngeal Swab  Result Value Ref Range Status   SARS Coronavirus 2 NEGATIVE NEGATIVE Final    Comment: (NOTE) SARS-CoV-2 target nucleic acids are NOT DETECTED.  The SARS-CoV-2 RNA is generally detectable in upper and lower respiratory specimens during the acute phase of infection. The lowest concentration of SARS-CoV-2 viral copies this assay can detect is 250 copies / mL. A negative result does not preclude SARS-CoV-2 infection and should not be used as the sole basis for treatment or other patient management decisions.  A negative result may occur with improper specimen collection / handling, submission of specimen other than nasopharyngeal swab, presence of viral mutation(s)  within the areas targeted by this assay, and inadequate number of viral copies (<250 copies / mL). A negative result must be combined with clinical observations, patient history, and epidemiological information.  Fact Sheet for Patients:   StrictlyIdeas.no  Fact Sheet for Healthcare Providers: BankingDealers.co.za  This test is not yet approved or  cleared by the [place] FDA and has been authorized for detection and/or diagnosis of SARS-CoV-2 by FDA under an Emergency Use Authorization (EUA).  This EUA will remain in effect (meaning this test can be used) for the duration of the COVID-19 declaration under Section 564(b)(1) of the Act, 21 U.S.C. section 360bbb-3(b)(1), unless the authorization is terminated or revoked sooner.  Performed at [facility], [place] [address] [place], [place] [address]   Culture, blood (Routine x 2)     Status: None (Preliminary result)   Collection Time: 09/05/20 11:55 PM   Specimen: BLOOD RIGHT ARM  Result Value Ref Range Status   Specimen Description BLOOD RIGHT ARM  Final   Special Requests   Final    BOTTLES DRAWN AEROBIC AND ANAEROBIC Blood Culture adequate volume   Culture   Final    NO GROWTH 2 DAYS Performed at [facility], [place] [address], [place], [place] [address]    Report Status PENDING  Incomplete    Please note: You were cared for by a hospitalist during your hospital stay. Once you are discharged, your primary care physician will handle any further medical issues. Please note that NO REFILLS for any discharge medications will be authorized once you are discharged, as it is imperative that you return to your primary care physician (or establish a relationship with a primary care physician if you do not have one) for your post hospital discharge needs so that they can reassess your need for medications and monitor your lab values.    Time coordinating discharge: 40  minutes  SIGNED:   Shelly Coss, MD  [facility] 09/08/2020, 1:20 PM Pager [phone]  If 7PM-7AM, please contact night-coverage [email or web address] Password [email or web address]

## 2020-09-08 NOTE — TOC Transition Note (Signed)
Transition of Care [facility]) - CM/SW Discharge Note   Patient Details  Name: [name] MRN: [id number] Date of Birth: 22-Jun-1951  Transition of Care [facility]) CM/SW Contact:  Maebelle Munroe, RN Phone Number: 09/08/2020, 2:53 PM   Clinical Narrative: [facility] team spoke to spouse- [name] for discharge planning. She is currently at the bedside with the patient. She is in agreement with discharge plan for home health PT. [facility] health services arranged with [facility]. DME- 3 in 1 bedside commode arranged through [employer] for room delivery. Discharge planning complete.    Final next level of care: [place] Barriers to Discharge: No Barriers Identified   Patient Goals and CMS Choice Patient states their goals for this hospitalization and ongoing recovery are:: Return home. CMS Medicare.gov Compare Post Acute Care list provided to:: Patient Represenative (must comment) (spouse- [name]) Choice offered to / list presented to : Spouse  Discharge Placement                       Discharge Plan and Services                DME Arranged: 3-N-1 DME Agency: [facility] Date DME Agency Contacted: 09/08/20 Time DME Agency Contacted: 99 Representative spoke with at [place]: [place] [facility] HH Arranged: PT [place]: [place] ([place]) Date [place]: 09/08/20 Time [place]: 1415 Representative spoke with at [place]: [place]  Social Determinants of Health (SDOH) Interventions     Readmission Risk Interventions No flowsheet data found.

## 2020-09-08 NOTE — Discharge Summary (Incomplete)
Patient waiting on a stool to be brought to them by home health so that they can go home.

## 2020-09-09 ENCOUNTER — Telehealth: Payer: Self-pay | Admitting: Internal Medicine

## 2020-09-09 ENCOUNTER — Telehealth: Payer: Self-pay

## 2020-09-09 DIAGNOSIS — F32A Depression, unspecified: Secondary | ICD-10-CM | POA: Diagnosis not present

## 2020-09-09 DIAGNOSIS — E871 Hypo-osmolality and hyponatremia: Secondary | ICD-10-CM | POA: Diagnosis not present

## 2020-09-09 DIAGNOSIS — Z952 Presence of prosthetic heart valve: Secondary | ICD-10-CM | POA: Diagnosis not present

## 2020-09-09 DIAGNOSIS — Z9181 History of falling: Secondary | ICD-10-CM | POA: Diagnosis not present

## 2020-09-09 DIAGNOSIS — T7840XD Allergy, unspecified, subsequent encounter: Secondary | ICD-10-CM | POA: Diagnosis not present

## 2020-09-09 DIAGNOSIS — I130 Hypertensive heart and chronic kidney disease with heart failure and stage 1 through stage 4 chronic kidney disease, or unspecified chronic kidney disease: Secondary | ICD-10-CM | POA: Diagnosis not present

## 2020-09-09 DIAGNOSIS — M1990 Unspecified osteoarthritis, unspecified site: Secondary | ICD-10-CM | POA: Diagnosis not present

## 2020-09-09 DIAGNOSIS — Z9981 Dependence on supplemental oxygen: Secondary | ICD-10-CM | POA: Diagnosis not present

## 2020-09-09 DIAGNOSIS — F418 Other specified anxiety disorders: Secondary | ICD-10-CM | POA: Diagnosis not present

## 2020-09-09 DIAGNOSIS — J9621 Acute and chronic respiratory failure with hypoxia: Secondary | ICD-10-CM | POA: Diagnosis not present

## 2020-09-09 DIAGNOSIS — Z7951 Long term (current) use of inhaled steroids: Secondary | ICD-10-CM | POA: Diagnosis not present

## 2020-09-09 DIAGNOSIS — E785 Hyperlipidemia, unspecified: Secondary | ICD-10-CM | POA: Diagnosis not present

## 2020-09-09 DIAGNOSIS — K219 Gastro-esophageal reflux disease without esophagitis: Secondary | ICD-10-CM | POA: Diagnosis not present

## 2020-09-09 DIAGNOSIS — J449 Chronic obstructive pulmonary disease, unspecified: Secondary | ICD-10-CM | POA: Diagnosis not present

## 2020-09-09 DIAGNOSIS — I4821 Permanent atrial fibrillation: Secondary | ICD-10-CM | POA: Diagnosis not present

## 2020-09-09 DIAGNOSIS — N1832 Chronic kidney disease, stage 3b: Secondary | ICD-10-CM | POA: Diagnosis not present

## 2020-09-09 DIAGNOSIS — I5033 Acute on chronic diastolic (congestive) heart failure: Secondary | ICD-10-CM | POA: Diagnosis not present

## 2020-09-09 DIAGNOSIS — Z9049 Acquired absence of other specified parts of digestive tract: Secondary | ICD-10-CM | POA: Diagnosis not present

## 2020-09-09 DIAGNOSIS — Z7901 Long term (current) use of anticoagulants: Secondary | ICD-10-CM | POA: Diagnosis not present

## 2020-09-09 DIAGNOSIS — Z87891 Personal history of nicotine dependence: Secondary | ICD-10-CM | POA: Diagnosis not present

## 2020-09-09 DIAGNOSIS — G4733 Obstructive sleep apnea (adult) (pediatric): Secondary | ICD-10-CM | POA: Diagnosis not present

## 2020-09-09 DIAGNOSIS — N400 Enlarged prostate without lower urinary tract symptoms: Secondary | ICD-10-CM | POA: Diagnosis not present

## 2020-09-09 DIAGNOSIS — I8390 Asymptomatic varicose veins of unspecified lower extremity: Secondary | ICD-10-CM | POA: Diagnosis not present

## 2020-09-09 DIAGNOSIS — R7301 Impaired fasting glucose: Secondary | ICD-10-CM | POA: Diagnosis not present

## 2020-09-09 NOTE — Telephone Encounter (Signed)
Spoke w/ Peter- verbal orders given.  

## 2020-09-09 NOTE — Telephone Encounter (Signed)
Transition Care Management Follow-up Telephone Call  Date of discharge and from where: 09/08/20-[place]  How have you been since you were released from the hospital? Feeling ok. Using oxygen.  Any questions or concerns? No  Items Reviewed:  Did the pt receive and understand the discharge instructions provided? Yes   Medications obtained and verified? Yes   Other? Yes   Any new allergies since your discharge? No   Dietary orders reviewed? Yes  Do you have support at home? No   Home Care and Equipment/Supplies: Were home health services ordered? yes If so, what is the name of the agency? unknown Has the agency set up a time to come to the patient's home? yes Were any new equipment or medical supplies ordered?  No What is the name of the medical supply agency? n/a Were you able to get the supplies/equipment? not applicable Do you have any questions related to the use of the equipment or supplies? n/a  Functional Questionnaire: (I = Independent and D = Dependent) ADLs: I with assistance  Bathing/Dressing- I  Meal Prep- D  Eating- I  Maintaining continence- I  Transferring/Ambulation- I with walker  Managing Meds- I with assistance  Follow up appointments reviewed:   PCP Hospital f/u appt confirmed? Yes  Scheduled to see Dr. Larose Kells on 09/13/20 @ 11:00.  [facility] f/u appt confirmed? No  Patient's wife to call & schedule  Are transportation arrangements needed? No   If their condition worsens, is the pt aware to call PCP or go to the Emergency Dept.? Yes  Was the patient provided with contact information for the PCP's office or ED? Yes  Was to pt encouraged to call back with questions or concerns? Yes

## 2020-09-09 NOTE — Telephone Encounter (Signed)
Peter with [facility]  Call Back @ [phone]  Collier Salina is requesting a verbal order for in home health care for PT   Frequency 2*7 1*2  Also a nursing eval

## 2020-09-10 ENCOUNTER — Ambulatory Visit: Payer: Medicare Other | Admitting: Internal Medicine

## 2020-09-11 ENCOUNTER — Other Ambulatory Visit (HOSPITAL_COMMUNITY): Payer: Medicare Other

## 2020-09-11 LAB — CULTURE, BLOOD (ROUTINE X 2)
Culture: NO GROWTH
Culture: NO GROWTH
Special Requests: ADEQUATE
Special Requests: ADEQUATE

## 2020-09-12 DIAGNOSIS — I4821 Permanent atrial fibrillation: Secondary | ICD-10-CM | POA: Diagnosis not present

## 2020-09-12 DIAGNOSIS — J9621 Acute and chronic respiratory failure with hypoxia: Secondary | ICD-10-CM | POA: Diagnosis not present

## 2020-09-12 DIAGNOSIS — I5033 Acute on chronic diastolic (congestive) heart failure: Secondary | ICD-10-CM | POA: Diagnosis not present

## 2020-09-12 DIAGNOSIS — N1832 Chronic kidney disease, stage 3b: Secondary | ICD-10-CM | POA: Diagnosis not present

## 2020-09-12 DIAGNOSIS — I130 Hypertensive heart and chronic kidney disease with heart failure and stage 1 through stage 4 chronic kidney disease, or unspecified chronic kidney disease: Secondary | ICD-10-CM | POA: Diagnosis not present

## 2020-09-12 DIAGNOSIS — J449 Chronic obstructive pulmonary disease, unspecified: Secondary | ICD-10-CM | POA: Diagnosis not present

## 2020-09-13 ENCOUNTER — Ambulatory Visit (INDEPENDENT_AMBULATORY_CARE_PROVIDER_SITE_OTHER): Payer: Medicare Other | Admitting: Internal Medicine

## 2020-09-13 ENCOUNTER — Other Ambulatory Visit: Payer: Self-pay

## 2020-09-13 VITALS — BP 112/62 | HR 75 | Temp 98.0°F | Ht 72.0 in | Wt 193.4 lb

## 2020-09-13 DIAGNOSIS — R079 Chest pain, unspecified: Secondary | ICD-10-CM

## 2020-09-13 DIAGNOSIS — J9611 Chronic respiratory failure with hypoxia: Secondary | ICD-10-CM

## 2020-09-13 DIAGNOSIS — I509 Heart failure, unspecified: Secondary | ICD-10-CM

## 2020-09-13 DIAGNOSIS — J449 Chronic obstructive pulmonary disease, unspecified: Secondary | ICD-10-CM | POA: Diagnosis not present

## 2020-09-13 LAB — BASIC METABOLIC PANEL
BUN: 32 mg/dL — ABNORMAL HIGH (ref 6–23)
CO2: 32 mEq/L (ref 19–32)
Calcium: 9.1 mg/dL (ref 8.4–10.5)
Chloride: 93 mEq/L — ABNORMAL LOW (ref 96–112)
Creatinine, Ser: 1.85 mg/dL — ABNORMAL HIGH (ref 0.40–1.50)
GFR: 36.37 mL/min — ABNORMAL LOW (ref >60.00)
Glucose, Bld: 78 mg/dL (ref 70–99)
Potassium: 4.3 mEq/L (ref 3.5–5.1)
Sodium: 132 mEq/L — ABNORMAL LOW (ref 135–145)

## 2020-09-13 LAB — CBC WITH DIFFERENTIAL/PLATELET
Basophils Absolute: 0 10*3/uL (ref 0.0–0.1)
Basophils Relative: 0.4 % (ref 0.0–3.0)
Eosinophils Absolute: 0.2 10*3/uL (ref 0.0–0.7)
Eosinophils Relative: 2.1 % (ref 0.0–5.0)
HCT: 38.2 % — ABNORMAL LOW (ref 39.0–52.0)
Hemoglobin: 12.8 g/dL — ABNORMAL LOW (ref 13.0–17.0)
Lymphocytes Relative: 6.1 % — ABNORMAL LOW (ref 12.0–46.0)
Lymphs Abs: 0.5 10*3/uL — ABNORMAL LOW (ref 0.7–4.0)
MCHC: 33.6 g/dL (ref 30.0–36.0)
MCV: 92.2 fl (ref 78.0–100.0)
Monocytes Absolute: 0.8 10*3/uL (ref 0.1–1.0)
Monocytes Relative: 9.6 % (ref 3.0–12.0)
Neutro Abs: 6.7 10*3/uL (ref 1.4–7.7)
Neutrophils Relative %: 81.8 % — ABNORMAL HIGH (ref 43.0–77.0)
Platelets: 292 10*3/uL (ref 150.0–400.0)
RBC: 4.14 Mil/uL — ABNORMAL LOW (ref 4.22–5.81)
RDW: 15.5 % (ref 11.5–15.5)
WBC: 8.1 10*3/uL (ref 4.0–10.5)

## 2020-09-13 NOTE — Progress Notes (Signed)
Subjective:    Patient ID: Sean Riley, male    DOB: 21-Feb-1951, 70 y.o.   MRN: [id number]  DOS:  09/13/2020 Type of visit - description: TCM-7, here with his wife  Admitted to the hospital and discharge 09/08/2020  Presented to the ER with SOB, O2 sats 77%, chest x-ray show pulmonary edema, B pleural effusions, BNP was greater than 300, Covid test negative. Had a mild fever at presentation and mildly elevated troponin.  Acute on chronic hypoxic respiratory failure, typically in 2 or 3 L of oxygen, required up to 10 L while in the hospital.  Issue felt to be due to pulmonary edema Acute on chronic diastolic CHF: EF @ the hospital 60 to 65%.  Was rec fluid and Celt restriction COPD: Have mild wheezing on exam, Rx prednisone. Was recommended PT at home upon discharge   Wt Readings from Last 3 Encounters:  09/13/20 193 lb 6.4 oz (87.7 kg)  09/08/20 192 lb 1.6 oz (87.1 kg)  08/23/20 200 lb 12.8 oz (91.1 kg)     Review of Systems Since he left the hospital is at home. Shortness of breath has definitely improved. He is following a low-salt diet. Denies fever chills. No nausea or vomiting. Lower extremity edema at baseline.  I ask about chest pain and he said that last night had a 15-minute episode of tightness located the left chest, while he was resting in bed. No associated nausea, sweats, increased shortness of breath. Some radiation to the left arm?. I asked if he had ripping or intense pain and he said no. He had similar episode a week ago right by the time he was at the hospital.    Past Medical History:  Diagnosis Date  . Allergy   . Arthritis   . 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 Atrial fibrillation ([place])   . Cataract    removed both eyes   . CHF (congestive heart failure) ([place])   . GERD (gastroesophageal reflux disease)   . HYPERLIPIDEMIA   . 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 Hypertension   . Microscopic hematuria     negative cystoscopy  . NONSPECIFIC ABNORMAL ELECTROCARDIOGRAM   . Pleural effusion on right   . Pleural effusion, bilateral   . S/P aortic dissection repair 03/24/2017   Biological Bentall aortic root replacement + resection and grafting of entire ascending aorta, transverse aortic arch and proximal descending thoracic aorta with elephant trunk distal anastomosis and debranching of aortic arch vessels  . 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 Varicose veins     Past Surgical History:  Procedure Laterality Date  . APPENDECTOMY    . CARDIAC CATHETERIZATION      X3;Dr Caryl Comes, last 01/20/12: mild non-obstructive CAD, normal LV systolic function  . CARDIOVERSION N/A 05/31/2018   Procedure: CARDIOVERSION;  Surgeon: Sueanne Margarita, MD;  Location: [facility] ENDOSCOPY;  Service: Cardiovascular;  Laterality: N/A;  . CARDIOVERSION N/A 06/13/2020   Procedure: CARDIOVERSION;  Surgeon: Larey Dresser, MD;  Location[facility]COPY;  Service: Cardiovascular;  Laterality: N/A;  . COLONOSCOPY    . CYSTOSCOPY  1978   Dr Hartley Barefoot  . EYE SURGERY    . IR THORACENTESIS ASP PLEURAL SPACE W/IMG GUIDE  11/25/2017  . KNEE ARTHROSCOPY  2012  Dr Theda Sers  . LUMBAR LAMINECTOMY  1987   Dr Durward Fortes  . MYELOGRAM  2007  . PERICARDIOCENTESIS N/A 04/07/2017   Procedure: PERICARDIOCENTESIS;  Surgeon: Sherren Mocha, MD;  Location: [facility] CV LAB;  Service: Cardiovascular;  Laterality: N/A;  . POLYPECTOMY    . REPAIR OF ACUTE ASCENDING THORACIC AORTIC DISSECTION N/A 03/23/2017   Procedure: REPAIR OF ACUTE ASCENDING THORACIC AORTIC DISSECTION.  Bentall procedure.  Aortic root repleacement with bioprosthetic valve.  Reimplantation of left and right coronary arteries.  Total resection of transverse aortic arch.  Elephant trunk  distal anastomosis and debranching of arch vessels.;  Surgeon: Rexene Alberts, MD;  Location: [facility];  Service: Vascular;  Laterality: N/A;  . RIGHT HEART CATH N/A 06/15/2019   Procedure: RIGHT HEART CATH;  Surgeon: Larey Dresser, MD;  Location: [name] CV LAB;  Service: Cardiovascular;  Laterality: N/A;  . ROTATOR CUFF REPAIR    . SHOULDER ARTHROSCOPY  2011   Dr Theda Sers  . SHOULDER ARTHROSCOPY Right 08/2015  . SPINAL FUSION  1986   Dr Rolin Barry  . TEE WITHOUT CARDIOVERSION N/A 03/23/2017   Procedure: TRANSESOPHAGEAL ECHOCARDIOGRAM (TEE);  Surgeon: Rexene Alberts, MD;  Location: [facility];  Service: Open Heart Surgery;  Laterality: N/A;  . THORACIC AORTIC ENDOVASCULAR STENT GRAFT N/A 03/30/2018   Procedure: THORACIC AORTIC ENDOVASCULAR STENT GRAFT WITH INTRAVASCULAR ULTRASOUND;  Surgeon: Serafina Mitchell, MD;  Location: [facility] OR;  Service: Vascular;  Laterality: N/A;  . TRACHEOSTOMY     age 11 for croup    Allergies as of 09/13/2020      Reactions   Simvastatin Other (See Comments)   Mental status changes   Celecoxib Other (See Comments)   GI UPSET AND INFLAMMATION   Codeine Other (See Comments)   GI UPSET AND INFLAMMATION   Nsaids Other (See Comments)   GI UPSET AND INFLAMMATION (can tolerate via IV)   Tape Other (See Comments)   Medical tape and Band-Aids PULL OFF THE SKIN; please use Coban wrap   Cephalexin Itching, Rash   Latex Rash      Medication List       Accurate as of September 13, 2020 11:59 PM. If you have any questions, ask your nurse or doctor.        albuterol 108 (90 Base) MCG/ACT inhaler Commonly known as: ProAir HFA Inhale 2 puffs into the lungs every 4 (four) hours as needed for wheezing or shortness of breath.   albuterol (2.5 MG/3ML) 0.083% nebulizer solution Commonly known as: PROVENTIL Take 3 mLs (2.5 mg total) by nebulization every 8 (eight) hours as needed for wheezing or shortness of breath.   ALPRAZolam 1 MG tablet Commonly known as: XANAX TAKE  1-1&1/2 TABLETS BY MOUTH AT BEDTIME What changed: See the new instructions.   apixaban 5 MG Tabs tablet Commonly known as: Eliquis Take 1 tablet (5 mg total) by mouth 2 (two) times daily.   augmented betamethasone dipropionate 0.05 % cream Commonly known as: DIPROLENE-AF Apply topically 2 (two) times daily as needed. What changed:   how much to take  reasons to take this   bisacodyl 5 MG EC tablet Commonly known as: DULCOLAX Take 5 mg by mouth daily as needed for moderate constipation.   diphenhydrAMINE 25 MG tablet Commonly known as: BENADRYL Take 25 mg by mouth every 8 (eight) hours as needed (allergies/runny nose).   empagliflozin 10 MG Tabs tablet Commonly known as: Jardiance Take 1 tablet (10 mg total) by mouth daily  before breakfast.   escitalopram 10 MG tablet Commonly known as: LEXAPRO Take 1 tablet (10 mg total) by mouth daily.   esomeprazole 40 MG capsule Commonly known as: NexIUM Take 1 capsule (40 mg total) by mouth daily before breakfast.   flecainide 50 MG tablet Commonly known as: TAMBOCOR TAKE ONE AND ONE-HALF TABLETS TWICE A DAY What changed:   how much to take  how to take this  when to take this  additional instructions   fluticasone 50 MCG/ACT nasal spray Commonly known as: FLONASE Place 1 spray into both nostrils 2 (two) times daily as needed for allergies. Use one spray in each nostril twice daily. What changed:   reasons to take this  additional instructions   guaiFENesin 600 MG 12 hr tablet Commonly known as: MUCINEX Take 2 tablets (1,200 mg total) by mouth 2 (two) times daily for 7 days.   ipratropium-albuterol 0.5-2.5 (3) MG/3ML Soln Commonly known as: DUONEB Take 3 mLs by nebulization 4 (four) times daily.   Klor-Con M20 20 MEQ tablet Generic drug: potassium chloride SA TAKE 2 TABLETS (40 MEQ) DAILY What changed: See the new instructions.   losartan 25 MG tablet Commonly known as: COZAAR Take 1 tablet (25 mg total) by  mouth daily.   metoprolol tartrate 25 MG tablet Commonly known as: LOPRESSOR Take 0.5 tablets (12.5 mg total) by mouth 2 (two) times daily. Please make yearly appt with Dr. Caryl Comes for January 2022 for future refills. Thank you 1st attempt   OXYGEN Inhale 2 L/min into the lungs as needed (for shortness of breath).   predniSONE 20 MG tablet Commonly known as: DELTASONE Take 2 tablets (40 mg total) by mouth daily with breakfast for 4 days.   rosuvastatin 5 MG tablet Commonly known as: CRESTOR Take 1 tablet (5 mg total) by mouth daily. What changed: when to take this   tamsulosin 0.4 MG Caps capsule Commonly known as: FLOMAX Take 1 capsule (0.4 mg total) by mouth daily.   torsemide 20 MG tablet Commonly known as: DEMADEX Take 40 mg (2 tabs) every other day ALTERNATING with 60 mg (3 tabs) every other day What changed:   how much to take  how to take this  when to take this  additional instructions          Objective:   Physical Exam BP 112/62 (BP Location: Left Arm, Patient Position: Sitting, Cuff Size: Large)   Pulse 75   Temp 98 F (36.7 C) (Oral)   Ht 6' (1.829 m)   Wt 193 lb 6.4 oz (87.7 kg)   SpO2 90%   BMI 26.23 kg/m  General:   Well developed, NAD, BMI noted. HEENT:  Normocephalic . Face symmetric, atraumatic Lungs:  Large airway congestion with cough, minimal dry crackles at both bases (not new) Normal respiratory effort, no intercostal retractions, no accessory muscle use. Heart: Seems regular, + murmur. Lower extremities: Mild pretibial edema bilaterally  Skin: Not pale. Not jaundice Neurologic:  alert & oriented X3.  Speech normal, gait not tested, needed assistance to transfer to the table. Psych--  Cognition and judgment appear intact.  Cooperative with normal attention span and concentration.  Behavior appropriate. No anxious or depressed appearing.      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 Loanne Drilling  >> Bx (-) 10-2014 ; took tamoxifen temporarily (self d/c 06-2016) ---Again 09/2019, labs/MMG done, on [place] agai Dermatology : eczema? rx taclonex H/o  Tracheostomy , age 51, croup Chronic anemia: Chronic anemia per chart review,+ iron deficient (08-2018). S/p  colonoscopy 05-2016, multiple polyps. EGD 2014 for chest pain: Negative, bx results?  PLAN: TCM 7 Acute on chronic hypoxic respiratory failure, acute on chronic diastolic CHF, COPD: Recent admitted to the hospital , dx w/ above problems. Currently at home,requiring 3 L to keep O2 sats about 94% entheses (not far from baseline), also reports he is O2 sats drop to 85% without supplementation. His weight before at the hospital was 192 pounds, has gained only 1 pound. He is trying to follow a low-salt diet and limit his fluid intakes. Since the last time I saw him, Vania Rea was added to his regimen. Plan: Continue present care, BMP and CBC. Chest pain: Patient report 2 episodes of chest pain as described above, a week ago and last night. At the hospital, troponins were 56 and 75, slightly elevated, felt to be demand ischemia. He has a history of A. fib, cardiac catheterization 2013 with mild nonobstructive CAD.   H/o thoracic aortic dissection 2018, aortic thoracic aneurysm endovascular stent graft 2019. Stress test 07-2015: No ischemia. EKG today: Sinus rhythm,  unchanged from previous. I discussed the case with Dr. Caryl Comes, cards, I appreciate his help.  Overall pain is unlikely to be ischemic, with history of previous aortic dissection repair,  a aortic dissection presentation  could be atypical. Plan: BMP, CBC. ER if chest pain again To be seen by Dr. Caryl Comes 1140 Monday 11:40 AM 09/16/2020 at the [facility] clinic Subsequently I spoke with Dr. Aundra Dubin who will coordinate the patient's care as well.  Appreciate his help.   This visit occurred during the SARS-CoV-2 public health emergency.  Safety protocols were in place, including screening questions prior to the visit, additional usage of staff PPE, and extensive cleaning of exam room while observing appropriate contact time as indicated for disinfecting solutions.

## 2020-09-13 NOTE — Patient Instructions (Addendum)
See Dr. Caryl Comes Monday at 11:40 AM at:  [facility]  [address], [place], [place] [address] [phone]   If you have another episode of chest pain: Go to the ER   GO TO THE LAB : Get the blood work     [place], [place] back for a checkup in 3 months

## 2020-09-15 NOTE — Assessment & Plan Note (Signed)
Acute on chronic hypoxic respiratory failure, acute on chronic diastolic CHF, COPD: Recent admitted to the hospital , dx w/ above problems. Currently at home,requiring 3 L to keep O2 sats about 94% entheses (not far from baseline), also reports he is O2 sats drop to 85% without supplementation. His weight before at the hospital was 192 pounds, has gained only 1 pound. He is trying to follow a low-salt diet and limit his fluid intakes. Since the last time I saw him, Vania Rea was added to his regimen. Plan: Continue present care, BMP and CBC. Chest pain: Patient report 2 episodes of chest pain as described above, a week ago and last night. At the hospital, troponins were 56 and 75, slightly elevated, felt to be demand ischemia. He has a history of A. fib, cardiac catheterization 2013 with mild nonobstructive CAD.   H/o thoracic aortic dissection 2018, aortic thoracic aneurysm endovascular stent graft 2019. Stress test 07-2015: No ischemia. EKG today: Sinus rhythm, unchanged from previous. I discussed the case with Dr. Caryl Comes, cards, I appreciate his help.  Overall pain is unlikely to be ischemic, with history of previous aortic dissection repair,  a aortic dissection presentation  could be atypical. Plan: BMP, CBC. ER if chest pain again To be seen by Dr. Caryl Comes 1140 Monday 11:40 AM 09/16/2020 at the [facility] clinic Subsequently I spoke with Dr. Aundra Dubin who will coordinate the patient's care as well.  Appreciate his help.

## 2020-09-16 ENCOUNTER — Ambulatory Visit: Payer: Medicare Other | Admitting: Internal Medicine

## 2020-09-16 ENCOUNTER — Encounter (HOSPITAL_COMMUNITY): Payer: Medicare Other | Admitting: Cardiology

## 2020-09-16 DIAGNOSIS — N1832 Chronic kidney disease, stage 3b: Secondary | ICD-10-CM | POA: Diagnosis not present

## 2020-09-16 DIAGNOSIS — I130 Hypertensive heart and chronic kidney disease with heart failure and stage 1 through stage 4 chronic kidney disease, or unspecified chronic kidney disease: Secondary | ICD-10-CM | POA: Diagnosis not present

## 2020-09-16 DIAGNOSIS — I5033 Acute on chronic diastolic (congestive) heart failure: Secondary | ICD-10-CM | POA: Diagnosis not present

## 2020-09-16 DIAGNOSIS — J449 Chronic obstructive pulmonary disease, unspecified: Secondary | ICD-10-CM | POA: Diagnosis not present

## 2020-09-16 DIAGNOSIS — J9621 Acute and chronic respiratory failure with hypoxia: Secondary | ICD-10-CM | POA: Diagnosis not present

## 2020-09-16 DIAGNOSIS — I4821 Permanent atrial fibrillation: Secondary | ICD-10-CM | POA: Diagnosis not present

## 2020-09-17 DIAGNOSIS — I4821 Permanent atrial fibrillation: Secondary | ICD-10-CM | POA: Diagnosis not present

## 2020-09-17 DIAGNOSIS — J449 Chronic obstructive pulmonary disease, unspecified: Secondary | ICD-10-CM | POA: Diagnosis not present

## 2020-09-17 DIAGNOSIS — I130 Hypertensive heart and chronic kidney disease with heart failure and stage 1 through stage 4 chronic kidney disease, or unspecified chronic kidney disease: Secondary | ICD-10-CM | POA: Diagnosis not present

## 2020-09-17 DIAGNOSIS — N1832 Chronic kidney disease, stage 3b: Secondary | ICD-10-CM | POA: Diagnosis not present

## 2020-09-17 DIAGNOSIS — J9621 Acute and chronic respiratory failure with hypoxia: Secondary | ICD-10-CM | POA: Diagnosis not present

## 2020-09-17 DIAGNOSIS — I5033 Acute on chronic diastolic (congestive) heart failure: Secondary | ICD-10-CM | POA: Diagnosis not present

## 2020-09-19 ENCOUNTER — Other Ambulatory Visit: Payer: Self-pay | Admitting: Internal Medicine

## 2020-09-20 ENCOUNTER — Other Ambulatory Visit (HOSPITAL_COMMUNITY): Payer: Self-pay | Admitting: *Deleted

## 2020-09-20 ENCOUNTER — Telehealth: Payer: Self-pay

## 2020-09-20 DIAGNOSIS — K219 Gastro-esophageal reflux disease without esophagitis: Secondary | ICD-10-CM | POA: Diagnosis not present

## 2020-09-20 DIAGNOSIS — I8390 Asymptomatic varicose veins of unspecified lower extremity: Secondary | ICD-10-CM | POA: Diagnosis not present

## 2020-09-20 DIAGNOSIS — E871 Hypo-osmolality and hyponatremia: Secondary | ICD-10-CM

## 2020-09-20 DIAGNOSIS — F32A Depression, unspecified: Secondary | ICD-10-CM | POA: Diagnosis not present

## 2020-09-20 DIAGNOSIS — Z9981 Dependence on supplemental oxygen: Secondary | ICD-10-CM

## 2020-09-20 DIAGNOSIS — I4821 Permanent atrial fibrillation: Secondary | ICD-10-CM | POA: Diagnosis not present

## 2020-09-20 DIAGNOSIS — N1832 Chronic kidney disease, stage 3b: Secondary | ICD-10-CM | POA: Diagnosis not present

## 2020-09-20 DIAGNOSIS — I5033 Acute on chronic diastolic (congestive) heart failure: Secondary | ICD-10-CM | POA: Diagnosis not present

## 2020-09-20 DIAGNOSIS — Z9181 History of falling: Secondary | ICD-10-CM

## 2020-09-20 DIAGNOSIS — I130 Hypertensive heart and chronic kidney disease with heart failure and stage 1 through stage 4 chronic kidney disease, or unspecified chronic kidney disease: Secondary | ICD-10-CM | POA: Diagnosis not present

## 2020-09-20 DIAGNOSIS — R7301 Impaired fasting glucose: Secondary | ICD-10-CM | POA: Diagnosis not present

## 2020-09-20 DIAGNOSIS — J449 Chronic obstructive pulmonary disease, unspecified: Secondary | ICD-10-CM | POA: Diagnosis not present

## 2020-09-20 DIAGNOSIS — E785 Hyperlipidemia, unspecified: Secondary | ICD-10-CM | POA: Diagnosis not present

## 2020-09-20 DIAGNOSIS — M1990 Unspecified osteoarthritis, unspecified site: Secondary | ICD-10-CM

## 2020-09-20 DIAGNOSIS — Z7951 Long term (current) use of inhaled steroids: Secondary | ICD-10-CM

## 2020-09-20 DIAGNOSIS — Z952 Presence of prosthetic heart valve: Secondary | ICD-10-CM

## 2020-09-20 DIAGNOSIS — Z87891 Personal history of nicotine dependence: Secondary | ICD-10-CM

## 2020-09-20 DIAGNOSIS — Z9049 Acquired absence of other specified parts of digestive tract: Secondary | ICD-10-CM

## 2020-09-20 DIAGNOSIS — N400 Enlarged prostate without lower urinary tract symptoms: Secondary | ICD-10-CM

## 2020-09-20 DIAGNOSIS — J9621 Acute and chronic respiratory failure with hypoxia: Secondary | ICD-10-CM | POA: Diagnosis not present

## 2020-09-20 DIAGNOSIS — T7840XD Allergy, unspecified, subsequent encounter: Secondary | ICD-10-CM

## 2020-09-20 DIAGNOSIS — G4733 Obstructive sleep apnea (adult) (pediatric): Secondary | ICD-10-CM

## 2020-09-20 DIAGNOSIS — F418 Other specified anxiety disorders: Secondary | ICD-10-CM | POA: Diagnosis not present

## 2020-09-20 DIAGNOSIS — Z7901 Long term (current) use of anticoagulants: Secondary | ICD-10-CM

## 2020-09-20 NOTE — Telephone Encounter (Signed)
Plan of care received, signed by Dr. Larose Kells, and faxed back to [place] [phone], faxed confirmation received. -JMA

## 2020-09-23 NOTE — Telephone Encounter (Signed)
Advanced Heart Failure Patient Advocate Encounter  Prior Authorization for Eliquis has been approved.    Effective dates: 07/24/20 through 07/23/21  Charlann Boxer, CPhT

## 2020-09-24 DIAGNOSIS — J9621 Acute and chronic respiratory failure with hypoxia: Secondary | ICD-10-CM | POA: Diagnosis not present

## 2020-09-24 DIAGNOSIS — J449 Chronic obstructive pulmonary disease, unspecified: Secondary | ICD-10-CM | POA: Diagnosis not present

## 2020-09-24 DIAGNOSIS — I130 Hypertensive heart and chronic kidney disease with heart failure and stage 1 through stage 4 chronic kidney disease, or unspecified chronic kidney disease: Secondary | ICD-10-CM | POA: Diagnosis not present

## 2020-09-24 DIAGNOSIS — N1832 Chronic kidney disease, stage 3b: Secondary | ICD-10-CM | POA: Diagnosis not present

## 2020-09-24 DIAGNOSIS — I5033 Acute on chronic diastolic (congestive) heart failure: Secondary | ICD-10-CM | POA: Diagnosis not present

## 2020-09-24 DIAGNOSIS — I4821 Permanent atrial fibrillation: Secondary | ICD-10-CM | POA: Diagnosis not present

## 2020-09-26 DIAGNOSIS — J449 Chronic obstructive pulmonary disease, unspecified: Secondary | ICD-10-CM | POA: Diagnosis not present

## 2020-09-26 DIAGNOSIS — I5033 Acute on chronic diastolic (congestive) heart failure: Secondary | ICD-10-CM | POA: Diagnosis not present

## 2020-09-26 DIAGNOSIS — N1832 Chronic kidney disease, stage 3b: Secondary | ICD-10-CM | POA: Diagnosis not present

## 2020-09-26 DIAGNOSIS — I130 Hypertensive heart and chronic kidney disease with heart failure and stage 1 through stage 4 chronic kidney disease, or unspecified chronic kidney disease: Secondary | ICD-10-CM | POA: Diagnosis not present

## 2020-09-26 DIAGNOSIS — J9621 Acute and chronic respiratory failure with hypoxia: Secondary | ICD-10-CM | POA: Diagnosis not present

## 2020-09-26 DIAGNOSIS — I4821 Permanent atrial fibrillation: Secondary | ICD-10-CM | POA: Diagnosis not present

## 2020-10-01 DIAGNOSIS — N1832 Chronic kidney disease, stage 3b: Secondary | ICD-10-CM | POA: Diagnosis not present

## 2020-10-01 DIAGNOSIS — J449 Chronic obstructive pulmonary disease, unspecified: Secondary | ICD-10-CM | POA: Diagnosis not present

## 2020-10-01 DIAGNOSIS — I130 Hypertensive heart and chronic kidney disease with heart failure and stage 1 through stage 4 chronic kidney disease, or unspecified chronic kidney disease: Secondary | ICD-10-CM | POA: Diagnosis not present

## 2020-10-01 DIAGNOSIS — J9621 Acute and chronic respiratory failure with hypoxia: Secondary | ICD-10-CM | POA: Diagnosis not present

## 2020-10-01 DIAGNOSIS — I4821 Permanent atrial fibrillation: Secondary | ICD-10-CM | POA: Diagnosis not present

## 2020-10-01 DIAGNOSIS — I5033 Acute on chronic diastolic (congestive) heart failure: Secondary | ICD-10-CM | POA: Diagnosis not present

## 2020-10-03 DIAGNOSIS — I130 Hypertensive heart and chronic kidney disease with heart failure and stage 1 through stage 4 chronic kidney disease, or unspecified chronic kidney disease: Secondary | ICD-10-CM | POA: Diagnosis not present

## 2020-10-03 DIAGNOSIS — J9621 Acute and chronic respiratory failure with hypoxia: Secondary | ICD-10-CM | POA: Diagnosis not present

## 2020-10-03 DIAGNOSIS — I4821 Permanent atrial fibrillation: Secondary | ICD-10-CM | POA: Diagnosis not present

## 2020-10-03 DIAGNOSIS — I5033 Acute on chronic diastolic (congestive) heart failure: Secondary | ICD-10-CM | POA: Diagnosis not present

## 2020-10-03 DIAGNOSIS — N1832 Chronic kidney disease, stage 3b: Secondary | ICD-10-CM | POA: Diagnosis not present

## 2020-10-03 DIAGNOSIS — J449 Chronic obstructive pulmonary disease, unspecified: Secondary | ICD-10-CM | POA: Diagnosis not present

## 2020-10-07 DIAGNOSIS — J449 Chronic obstructive pulmonary disease, unspecified: Secondary | ICD-10-CM | POA: Diagnosis not present

## 2020-10-07 DIAGNOSIS — I130 Hypertensive heart and chronic kidney disease with heart failure and stage 1 through stage 4 chronic kidney disease, or unspecified chronic kidney disease: Secondary | ICD-10-CM | POA: Diagnosis not present

## 2020-10-07 DIAGNOSIS — I5033 Acute on chronic diastolic (congestive) heart failure: Secondary | ICD-10-CM | POA: Diagnosis not present

## 2020-10-07 DIAGNOSIS — I4821 Permanent atrial fibrillation: Secondary | ICD-10-CM | POA: Diagnosis not present

## 2020-10-07 DIAGNOSIS — J9621 Acute and chronic respiratory failure with hypoxia: Secondary | ICD-10-CM | POA: Diagnosis not present

## 2020-10-07 DIAGNOSIS — N1832 Chronic kidney disease, stage 3b: Secondary | ICD-10-CM | POA: Diagnosis not present

## 2020-10-09 DIAGNOSIS — I5033 Acute on chronic diastolic (congestive) heart failure: Secondary | ICD-10-CM | POA: Diagnosis not present

## 2020-10-09 DIAGNOSIS — N1832 Chronic kidney disease, stage 3b: Secondary | ICD-10-CM | POA: Diagnosis not present

## 2020-10-09 DIAGNOSIS — Z9181 History of falling: Secondary | ICD-10-CM | POA: Diagnosis not present

## 2020-10-09 DIAGNOSIS — Z9981 Dependence on supplemental oxygen: Secondary | ICD-10-CM | POA: Diagnosis not present

## 2020-10-09 DIAGNOSIS — J449 Chronic obstructive pulmonary disease, unspecified: Secondary | ICD-10-CM | POA: Diagnosis not present

## 2020-10-09 DIAGNOSIS — F418 Other specified anxiety disorders: Secondary | ICD-10-CM | POA: Diagnosis not present

## 2020-10-09 DIAGNOSIS — E871 Hypo-osmolality and hyponatremia: Secondary | ICD-10-CM | POA: Diagnosis not present

## 2020-10-09 DIAGNOSIS — Z7951 Long term (current) use of inhaled steroids: Secondary | ICD-10-CM | POA: Diagnosis not present

## 2020-10-09 DIAGNOSIS — I8390 Asymptomatic varicose veins of unspecified lower extremity: Secondary | ICD-10-CM | POA: Diagnosis not present

## 2020-10-09 DIAGNOSIS — F32A Depression, unspecified: Secondary | ICD-10-CM | POA: Diagnosis not present

## 2020-10-09 DIAGNOSIS — E785 Hyperlipidemia, unspecified: Secondary | ICD-10-CM | POA: Diagnosis not present

## 2020-10-09 DIAGNOSIS — Z9049 Acquired absence of other specified parts of digestive tract: Secondary | ICD-10-CM | POA: Diagnosis not present

## 2020-10-09 DIAGNOSIS — Z7901 Long term (current) use of anticoagulants: Secondary | ICD-10-CM | POA: Diagnosis not present

## 2020-10-09 DIAGNOSIS — R7301 Impaired fasting glucose: Secondary | ICD-10-CM | POA: Diagnosis not present

## 2020-10-09 DIAGNOSIS — N400 Enlarged prostate without lower urinary tract symptoms: Secondary | ICD-10-CM | POA: Diagnosis not present

## 2020-10-09 DIAGNOSIS — T7840XD Allergy, unspecified, subsequent encounter: Secondary | ICD-10-CM | POA: Diagnosis not present

## 2020-10-09 DIAGNOSIS — J9621 Acute and chronic respiratory failure with hypoxia: Secondary | ICD-10-CM | POA: Diagnosis not present

## 2020-10-09 DIAGNOSIS — M1990 Unspecified osteoarthritis, unspecified site: Secondary | ICD-10-CM | POA: Diagnosis not present

## 2020-10-09 DIAGNOSIS — Z87891 Personal history of nicotine dependence: Secondary | ICD-10-CM | POA: Diagnosis not present

## 2020-10-09 DIAGNOSIS — Z952 Presence of prosthetic heart valve: Secondary | ICD-10-CM | POA: Diagnosis not present

## 2020-10-09 DIAGNOSIS — I130 Hypertensive heart and chronic kidney disease with heart failure and stage 1 through stage 4 chronic kidney disease, or unspecified chronic kidney disease: Secondary | ICD-10-CM | POA: Diagnosis not present

## 2020-10-09 DIAGNOSIS — K219 Gastro-esophageal reflux disease without esophagitis: Secondary | ICD-10-CM | POA: Diagnosis not present

## 2020-10-09 DIAGNOSIS — G4733 Obstructive sleep apnea (adult) (pediatric): Secondary | ICD-10-CM | POA: Diagnosis not present

## 2020-10-09 DIAGNOSIS — I4821 Permanent atrial fibrillation: Secondary | ICD-10-CM | POA: Diagnosis not present

## 2020-10-11 ENCOUNTER — Other Ambulatory Visit: Payer: Self-pay

## 2020-10-11 ENCOUNTER — Encounter: Payer: Medicare Other | Admitting: Cardiology

## 2020-10-11 DIAGNOSIS — I130 Hypertensive heart and chronic kidney disease with heart failure and stage 1 through stage 4 chronic kidney disease, or unspecified chronic kidney disease: Secondary | ICD-10-CM | POA: Diagnosis not present

## 2020-10-11 DIAGNOSIS — N1832 Chronic kidney disease, stage 3b: Secondary | ICD-10-CM | POA: Diagnosis not present

## 2020-10-11 DIAGNOSIS — I5033 Acute on chronic diastolic (congestive) heart failure: Secondary | ICD-10-CM | POA: Diagnosis not present

## 2020-10-11 DIAGNOSIS — J449 Chronic obstructive pulmonary disease, unspecified: Secondary | ICD-10-CM | POA: Diagnosis not present

## 2020-10-11 DIAGNOSIS — J9621 Acute and chronic respiratory failure with hypoxia: Secondary | ICD-10-CM | POA: Diagnosis not present

## 2020-10-11 DIAGNOSIS — I4821 Permanent atrial fibrillation: Secondary | ICD-10-CM | POA: Diagnosis not present

## 2020-10-11 NOTE — Progress Notes (Signed)
This encounter was created in error - please disregard.

## 2020-10-15 DIAGNOSIS — J9621 Acute and chronic respiratory failure with hypoxia: Secondary | ICD-10-CM | POA: Diagnosis not present

## 2020-10-15 DIAGNOSIS — I5033 Acute on chronic diastolic (congestive) heart failure: Secondary | ICD-10-CM | POA: Diagnosis not present

## 2020-10-15 DIAGNOSIS — N1832 Chronic kidney disease, stage 3b: Secondary | ICD-10-CM | POA: Diagnosis not present

## 2020-10-15 DIAGNOSIS — I130 Hypertensive heart and chronic kidney disease with heart failure and stage 1 through stage 4 chronic kidney disease, or unspecified chronic kidney disease: Secondary | ICD-10-CM | POA: Diagnosis not present

## 2020-10-15 DIAGNOSIS — J449 Chronic obstructive pulmonary disease, unspecified: Secondary | ICD-10-CM | POA: Diagnosis not present

## 2020-10-15 DIAGNOSIS — I4821 Permanent atrial fibrillation: Secondary | ICD-10-CM | POA: Diagnosis not present

## 2020-10-17 DIAGNOSIS — J9621 Acute and chronic respiratory failure with hypoxia: Secondary | ICD-10-CM | POA: Diagnosis not present

## 2020-10-17 DIAGNOSIS — I4821 Permanent atrial fibrillation: Secondary | ICD-10-CM | POA: Diagnosis not present

## 2020-10-17 DIAGNOSIS — N1832 Chronic kidney disease, stage 3b: Secondary | ICD-10-CM | POA: Diagnosis not present

## 2020-10-17 DIAGNOSIS — I130 Hypertensive heart and chronic kidney disease with heart failure and stage 1 through stage 4 chronic kidney disease, or unspecified chronic kidney disease: Secondary | ICD-10-CM | POA: Diagnosis not present

## 2020-10-17 DIAGNOSIS — I5033 Acute on chronic diastolic (congestive) heart failure: Secondary | ICD-10-CM | POA: Diagnosis not present

## 2020-10-17 DIAGNOSIS — J449 Chronic obstructive pulmonary disease, unspecified: Secondary | ICD-10-CM | POA: Diagnosis not present

## 2020-10-21 ENCOUNTER — Ambulatory Visit (HOSPITAL_BASED_OUTPATIENT_CLINIC_OR_DEPARTMENT_OTHER): Payer: Medicare Other

## 2020-10-22 DIAGNOSIS — I4821 Permanent atrial fibrillation: Secondary | ICD-10-CM | POA: Diagnosis not present

## 2020-10-22 DIAGNOSIS — J449 Chronic obstructive pulmonary disease, unspecified: Secondary | ICD-10-CM | POA: Diagnosis not present

## 2020-10-22 DIAGNOSIS — N1832 Chronic kidney disease, stage 3b: Secondary | ICD-10-CM | POA: Diagnosis not present

## 2020-10-22 DIAGNOSIS — I130 Hypertensive heart and chronic kidney disease with heart failure and stage 1 through stage 4 chronic kidney disease, or unspecified chronic kidney disease: Secondary | ICD-10-CM | POA: Diagnosis not present

## 2020-10-22 DIAGNOSIS — J9621 Acute and chronic respiratory failure with hypoxia: Secondary | ICD-10-CM | POA: Diagnosis not present

## 2020-10-22 DIAGNOSIS — I5033 Acute on chronic diastolic (congestive) heart failure: Secondary | ICD-10-CM | POA: Diagnosis not present

## 2020-10-25 DIAGNOSIS — I130 Hypertensive heart and chronic kidney disease with heart failure and stage 1 through stage 4 chronic kidney disease, or unspecified chronic kidney disease: Secondary | ICD-10-CM | POA: Diagnosis not present

## 2020-10-25 DIAGNOSIS — J9621 Acute and chronic respiratory failure with hypoxia: Secondary | ICD-10-CM | POA: Diagnosis not present

## 2020-10-25 DIAGNOSIS — N1832 Chronic kidney disease, stage 3b: Secondary | ICD-10-CM | POA: Diagnosis not present

## 2020-10-25 DIAGNOSIS — J449 Chronic obstructive pulmonary disease, unspecified: Secondary | ICD-10-CM | POA: Diagnosis not present

## 2020-10-25 DIAGNOSIS — I5033 Acute on chronic diastolic (congestive) heart failure: Secondary | ICD-10-CM | POA: Diagnosis not present

## 2020-10-25 DIAGNOSIS — I4821 Permanent atrial fibrillation: Secondary | ICD-10-CM | POA: Diagnosis not present

## 2020-10-28 DIAGNOSIS — I4821 Permanent atrial fibrillation: Secondary | ICD-10-CM | POA: Diagnosis not present

## 2020-10-28 DIAGNOSIS — I5033 Acute on chronic diastolic (congestive) heart failure: Secondary | ICD-10-CM | POA: Diagnosis not present

## 2020-10-28 DIAGNOSIS — J449 Chronic obstructive pulmonary disease, unspecified: Secondary | ICD-10-CM | POA: Diagnosis not present

## 2020-10-28 DIAGNOSIS — I130 Hypertensive heart and chronic kidney disease with heart failure and stage 1 through stage 4 chronic kidney disease, or unspecified chronic kidney disease: Secondary | ICD-10-CM | POA: Diagnosis not present

## 2020-10-28 DIAGNOSIS — J9621 Acute and chronic respiratory failure with hypoxia: Secondary | ICD-10-CM | POA: Diagnosis not present

## 2020-10-28 DIAGNOSIS — N1832 Chronic kidney disease, stage 3b: Secondary | ICD-10-CM | POA: Diagnosis not present

## 2020-10-30 ENCOUNTER — Telehealth: Payer: Self-pay | Admitting: Internal Medicine

## 2020-10-30 DIAGNOSIS — I4821 Permanent atrial fibrillation: Secondary | ICD-10-CM | POA: Diagnosis not present

## 2020-10-30 DIAGNOSIS — N1832 Chronic kidney disease, stage 3b: Secondary | ICD-10-CM | POA: Diagnosis not present

## 2020-10-30 DIAGNOSIS — I5033 Acute on chronic diastolic (congestive) heart failure: Secondary | ICD-10-CM | POA: Diagnosis not present

## 2020-10-30 DIAGNOSIS — J9621 Acute and chronic respiratory failure with hypoxia: Secondary | ICD-10-CM | POA: Diagnosis not present

## 2020-10-30 DIAGNOSIS — I130 Hypertensive heart and chronic kidney disease with heart failure and stage 1 through stage 4 chronic kidney disease, or unspecified chronic kidney disease: Secondary | ICD-10-CM | POA: Diagnosis not present

## 2020-10-30 DIAGNOSIS — J449 Chronic obstructive pulmonary disease, unspecified: Secondary | ICD-10-CM | POA: Diagnosis not present

## 2020-10-30 NOTE — Telephone Encounter (Signed)
PDMP okay, Rx sent 

## 2020-10-30 NOTE — Telephone Encounter (Signed)
Requesting: alprazolam Contract: 09/15/20 UDS: 05/18/17 Last Visit: 05/20/20 Next Visit: 12/11/20 Last Refill: 06/10/20  Please Advise

## 2020-11-06 ENCOUNTER — Telehealth: Payer: Self-pay | Admitting: Internal Medicine

## 2020-11-06 DIAGNOSIS — I4821 Permanent atrial fibrillation: Secondary | ICD-10-CM | POA: Diagnosis not present

## 2020-11-06 DIAGNOSIS — J9621 Acute and chronic respiratory failure with hypoxia: Secondary | ICD-10-CM | POA: Diagnosis not present

## 2020-11-06 DIAGNOSIS — I130 Hypertensive heart and chronic kidney disease with heart failure and stage 1 through stage 4 chronic kidney disease, or unspecified chronic kidney disease: Secondary | ICD-10-CM | POA: Diagnosis not present

## 2020-11-06 DIAGNOSIS — I5033 Acute on chronic diastolic (congestive) heart failure: Secondary | ICD-10-CM | POA: Diagnosis not present

## 2020-11-06 DIAGNOSIS — N1832 Chronic kidney disease, stage 3b: Secondary | ICD-10-CM | POA: Diagnosis not present

## 2020-11-06 DIAGNOSIS — J449 Chronic obstructive pulmonary disease, unspecified: Secondary | ICD-10-CM | POA: Diagnosis not present

## 2020-11-06 NOTE — Telephone Encounter (Signed)
Collier Salina, Phys therapist, calling to ask to extended the PT for patient.  Freq, 2 times a week for 7 weeks 1 time week for one

## 2020-11-06 NOTE — Telephone Encounter (Signed)
Spoke w/ Collier Salina- verbal orders given.

## 2020-11-08 DIAGNOSIS — Z7901 Long term (current) use of anticoagulants: Secondary | ICD-10-CM | POA: Diagnosis not present

## 2020-11-08 DIAGNOSIS — J9611 Chronic respiratory failure with hypoxia: Secondary | ICD-10-CM | POA: Diagnosis not present

## 2020-11-08 DIAGNOSIS — I130 Hypertensive heart and chronic kidney disease with heart failure and stage 1 through stage 4 chronic kidney disease, or unspecified chronic kidney disease: Secondary | ICD-10-CM | POA: Diagnosis not present

## 2020-11-08 DIAGNOSIS — T7840XD Allergy, unspecified, subsequent encounter: Secondary | ICD-10-CM | POA: Diagnosis not present

## 2020-11-08 DIAGNOSIS — Z7951 Long term (current) use of inhaled steroids: Secondary | ICD-10-CM | POA: Diagnosis not present

## 2020-11-08 DIAGNOSIS — J449 Chronic obstructive pulmonary disease, unspecified: Secondary | ICD-10-CM | POA: Diagnosis not present

## 2020-11-08 DIAGNOSIS — R7301 Impaired fasting glucose: Secondary | ICD-10-CM | POA: Diagnosis not present

## 2020-11-08 DIAGNOSIS — K219 Gastro-esophageal reflux disease without esophagitis: Secondary | ICD-10-CM | POA: Diagnosis not present

## 2020-11-08 DIAGNOSIS — F418 Other specified anxiety disorders: Secondary | ICD-10-CM | POA: Diagnosis not present

## 2020-11-08 DIAGNOSIS — Z952 Presence of prosthetic heart valve: Secondary | ICD-10-CM | POA: Diagnosis not present

## 2020-11-08 DIAGNOSIS — I5032 Chronic diastolic (congestive) heart failure: Secondary | ICD-10-CM | POA: Diagnosis not present

## 2020-11-08 DIAGNOSIS — I8390 Asymptomatic varicose veins of unspecified lower extremity: Secondary | ICD-10-CM | POA: Diagnosis not present

## 2020-11-08 DIAGNOSIS — Z87891 Personal history of nicotine dependence: Secondary | ICD-10-CM | POA: Diagnosis not present

## 2020-11-08 DIAGNOSIS — F32A Depression, unspecified: Secondary | ICD-10-CM | POA: Diagnosis not present

## 2020-11-08 DIAGNOSIS — I4891 Unspecified atrial fibrillation: Secondary | ICD-10-CM | POA: Diagnosis not present

## 2020-11-08 DIAGNOSIS — N400 Enlarged prostate without lower urinary tract symptoms: Secondary | ICD-10-CM | POA: Diagnosis not present

## 2020-11-08 DIAGNOSIS — N1832 Chronic kidney disease, stage 3b: Secondary | ICD-10-CM | POA: Diagnosis not present

## 2020-11-08 DIAGNOSIS — M1990 Unspecified osteoarthritis, unspecified site: Secondary | ICD-10-CM | POA: Diagnosis not present

## 2020-11-08 DIAGNOSIS — E871 Hypo-osmolality and hyponatremia: Secondary | ICD-10-CM | POA: Diagnosis not present

## 2020-11-08 DIAGNOSIS — Z9049 Acquired absence of other specified parts of digestive tract: Secondary | ICD-10-CM | POA: Diagnosis not present

## 2020-11-08 DIAGNOSIS — Z9981 Dependence on supplemental oxygen: Secondary | ICD-10-CM | POA: Diagnosis not present

## 2020-11-08 DIAGNOSIS — E785 Hyperlipidemia, unspecified: Secondary | ICD-10-CM | POA: Diagnosis not present

## 2020-11-08 DIAGNOSIS — Z9181 History of falling: Secondary | ICD-10-CM | POA: Diagnosis not present

## 2020-11-08 DIAGNOSIS — G4733 Obstructive sleep apnea (adult) (pediatric): Secondary | ICD-10-CM | POA: Diagnosis not present

## 2020-11-13 ENCOUNTER — Other Ambulatory Visit: Payer: Self-pay

## 2020-11-13 ENCOUNTER — Ambulatory Visit (HOSPITAL_BASED_OUTPATIENT_CLINIC_OR_DEPARTMENT_OTHER)
Admission: RE | Admit: 2020-11-13 | Discharge: 2020-11-13 | Disposition: A | Discharge status: Home / Self Care | Payer: Medicare Other | Source: Ambulatory Visit | Attending: Acute Care | Admitting: Acute Care

## 2020-11-13 DIAGNOSIS — Z87891 Personal history of nicotine dependence: Secondary | ICD-10-CM | POA: Diagnosis not present

## 2020-11-14 DIAGNOSIS — I130 Hypertensive heart and chronic kidney disease with heart failure and stage 1 through stage 4 chronic kidney disease, or unspecified chronic kidney disease: Secondary | ICD-10-CM | POA: Diagnosis not present

## 2020-11-14 DIAGNOSIS — I5032 Chronic diastolic (congestive) heart failure: Secondary | ICD-10-CM | POA: Diagnosis not present

## 2020-11-14 DIAGNOSIS — N1832 Chronic kidney disease, stage 3b: Secondary | ICD-10-CM | POA: Diagnosis not present

## 2020-11-14 DIAGNOSIS — I4891 Unspecified atrial fibrillation: Secondary | ICD-10-CM | POA: Diagnosis not present

## 2020-11-14 DIAGNOSIS — J449 Chronic obstructive pulmonary disease, unspecified: Secondary | ICD-10-CM | POA: Diagnosis not present

## 2020-11-14 DIAGNOSIS — J9611 Chronic respiratory failure with hypoxia: Secondary | ICD-10-CM | POA: Diagnosis not present

## 2020-11-15 ENCOUNTER — Encounter (HOSPITAL_COMMUNITY): Payer: Self-pay | Admitting: Cardiology

## 2020-11-15 ENCOUNTER — Other Ambulatory Visit: Payer: Self-pay

## 2020-11-15 ENCOUNTER — Ambulatory Visit (HOSPITAL_COMMUNITY)
Admission: RE | Admit: 2020-11-15 | Discharge: 2020-11-15 | Disposition: A | Discharge status: Home / Self Care | Payer: Medicare Other | Source: Ambulatory Visit | Attending: Cardiology | Admitting: Cardiology

## 2020-11-15 VITALS — BP 140/70 | HR 52 | Wt 197.6 lb

## 2020-11-15 DIAGNOSIS — Z7984 Long term (current) use of oral hypoglycemic drugs: Secondary | ICD-10-CM | POA: Diagnosis not present

## 2020-11-15 DIAGNOSIS — E785 Hyperlipidemia, unspecified: Secondary | ICD-10-CM | POA: Diagnosis not present

## 2020-11-15 DIAGNOSIS — I110 Hypertensive heart disease with heart failure: Secondary | ICD-10-CM | POA: Insufficient documentation

## 2020-11-15 DIAGNOSIS — I480 Paroxysmal atrial fibrillation: Secondary | ICD-10-CM | POA: Diagnosis not present

## 2020-11-15 DIAGNOSIS — Z7901 Long term (current) use of anticoagulants: Secondary | ICD-10-CM | POA: Diagnosis not present

## 2020-11-15 DIAGNOSIS — I7100 Dissection of unspecified site of aorta: Secondary | ICD-10-CM | POA: Diagnosis not present

## 2020-11-15 DIAGNOSIS — I5032 Chronic diastolic (congestive) heart failure: Secondary | ICD-10-CM

## 2020-11-15 DIAGNOSIS — J449 Chronic obstructive pulmonary disease, unspecified: Secondary | ICD-10-CM | POA: Diagnosis not present

## 2020-11-15 DIAGNOSIS — Z87891 Personal history of nicotine dependence: Secondary | ICD-10-CM | POA: Diagnosis not present

## 2020-11-15 DIAGNOSIS — G4733 Obstructive sleep apnea (adult) (pediatric): Secondary | ICD-10-CM | POA: Diagnosis not present

## 2020-11-15 DIAGNOSIS — I5042 Chronic combined systolic (congestive) and diastolic (congestive) heart failure: Secondary | ICD-10-CM | POA: Insufficient documentation

## 2020-11-15 DIAGNOSIS — Z8249 Family history of ischemic heart disease and other diseases of the circulatory system: Secondary | ICD-10-CM | POA: Insufficient documentation

## 2020-11-15 DIAGNOSIS — Z79899 Other long term (current) drug therapy: Secondary | ICD-10-CM | POA: Diagnosis not present

## 2020-11-15 DIAGNOSIS — Z9981 Dependence on supplemental oxygen: Secondary | ICD-10-CM | POA: Insufficient documentation

## 2020-11-15 DIAGNOSIS — Z953 Presence of xenogenic heart valve: Secondary | ICD-10-CM | POA: Insufficient documentation

## 2020-11-15 DIAGNOSIS — I5022 Chronic systolic (congestive) heart failure: Secondary | ICD-10-CM | POA: Diagnosis not present

## 2020-11-15 LAB — BRAIN NATRIURETIC PEPTIDE: B Natriuretic Peptide: 101.8 pg/mL — ABNORMAL HIGH (ref 0.0–100.0)

## 2020-11-15 LAB — BASIC METABOLIC PANEL
Anion gap: 7 (ref 5–15)
BUN: 15 mg/dL (ref 8–23)
CO2: 31 mmol/L (ref 22–32)
Calcium: 9.3 mg/dL (ref 8.9–10.3)
Chloride: 93 mmol/L — ABNORMAL LOW (ref 98–111)
Creatinine, Ser: 1.47 mg/dL — ABNORMAL HIGH (ref 0.61–1.24)
GFR, Estimated: 51 mL/min — ABNORMAL LOW (ref >60)
Glucose, Bld: 75 mg/dL (ref 70–99)
Potassium: 4.4 mmol/L (ref 3.5–5.1)
Sodium: 131 mmol/L — ABNORMAL LOW (ref 135–145)

## 2020-11-15 LAB — LIPID PANEL
Cholesterol: 156 mg/dL (ref 0–200)
HDL: 60 mg/dL (ref >40)
LDL Cholesterol: 84 mg/dL (ref 0–99)
Total CHOL/HDL Ratio: 2.6 RATIO
Triglycerides: 62 mg/dL (ref <150)
VLDL: 12 mg/dL (ref 0–40)

## 2020-11-15 MED ORDER — EMPAGLIFLOZIN 10 MG PO TABS: 10.0000 mg | ORAL_TABLET | Freq: Every day | ORAL | 3 refills | Status: DC

## 2020-11-15 NOTE — Patient Instructions (Addendum)
EKG done today.  Labs done today. We will contact you only if your labs are abnormal.  RESTART Jardiance 10mg  (1 tablet) by mouth daily  No other medication changes were made. Please continue all current medications as prescribed.  Your physician recommends that you schedule a follow-up appointment in:10 days for a lab only appointment and in 6 weeks with our PA/NP clinic here in our office.  If you have any questions or concerns before your next appointment please send [name] a message through [place] or call our office at [phone].    TO LEAVE A MESSAGE FOR THE NURSE SELECT OPTION 2, PLEASE LEAVE A MESSAGE INCLUDING: . YOUR NAME . DATE OF BIRTH . CALL BACK NUMBER . REASON FOR CALL**this is important as we prioritize the call backs  YOU WILL RECEIVE A CALL BACK THE SAME DAY AS LONG AS YOU CALL BEFORE 4:00 PM   Do the following things EVERYDAY: 1) Weigh yourself in the morning before breakfast. Write it down and keep it in a log. 2) Take your medicines as prescribed 3) Eat low salt foods--Limit salt (sodium) to 2000 mg per day.  4) Stay as active as you can everyday 5) Limit all fluids for the day to less than 2 liters   At the [facility], you and your health needs are our priority. As part of our continuing mission to provide you with exceptional heart care, we have created designated Provider Care Teams. These Care Teams include your primary Cardiologist (physician) and Advanced Practice Providers (APPs- Physician Assistants and Nurse Practitioners) who all work together to provide you with the care you need, when you need it.   You may see any of the following providers on your designated Care Team at your next follow up: [place] Kitchen Dr Glori Bickers . Dr [name] . Darrick Grinder, NP . Lyda Jester, PA . Audry Riles, PharmD   Please be sure to bring in all your medications bottles to every appointment.

## 2020-11-16 ENCOUNTER — Other Ambulatory Visit: Payer: Self-pay | Admitting: Internal Medicine

## 2020-11-16 NOTE — Progress Notes (Signed)
PCP: Colon Branch, MD Cardiology: Dr. Caryl Comes HF Cardiology: Dr. Aundra Dubin  70 y.o. with history of COPD, paroxysmal atrial fibrillation, type A aortic dissection, and chronic diastolic CHF was referred by Dr. Caryl Comes for evaluation of CHF.  Prior to 2018, patient reports no significant limitations.  In 8/18, he had a Type A aortic dissection.  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  The dissection extended down to the abdominal aorta.  He was a heavy smoker, quit in 8/18.  He developed paroxysmal atrial fibrillation, had DCCV in 10/19.  He is on Eliquis and flecainide now.  Last echo in 2/20 showed EF 55%, mildly decreased RV systolic function, normally functioning bioprosthetic aortic valve.  PFTs in 6/19 showed moderate-severe obstruction.    Ever since his operation in 2018, Mr Rappaport has had significant exertional dyspnea and has been quite limited.    He had RHC in 11/20, showing normal filling pressures, preserved cardiac output, and mild primarily pulmonary venous hypertension.    PYP scan was not suggestive of cardiac amyloidosis.   He saw pulmonary, PFTs showed moderate to severe obstruction.  He is using oxygen with exertion now, which he says helps. He is using CPAP.   Echo in 5/21 showed EF 55%, basal inferior akinesis, mild RV dilation with normal function, PASP 46 mmHg, s/p Bentall, s/p bioprosthetic aortic valve with mean gradient 16 mmHg.   11/21 had DCCV to NSR.  Had been in atrial fibrillation for a few weeks.   He was admitted in 1/22 with CHF exacerbation and diuresed.  Echo in 1/22 showed EF 60-65%, moderate RV enlargement with mildly decreased RV systolic function, PASP 44, bioprosthetic AVR with mean gradient 18 mmHg.   He returns for followup of CHF. Weight is down 2 lbs.  Using 2-3 L home oxygen.  He is in NSR, no palpitations.  Stable dyspnea after walking about 100 feet.  No orthopnea/PND.  Getting PT at home.     ECG (personally reviewed): NSR, LAFB, RBBB   Labs (8/20): LDL 74 Labs (10/20): Na 126, K 4.5, creatinine 1.15 Labs (11/20): K 3.8, creatinine 1.3 Labs (12/20): K 3.9, creatinine 1.5, myeloma panel negative Labs (3/21): K 4.1, creatinine 1.6, TSH normal Labs (5/21): BNP 142, creatinine 2 Labs (9/21): K 4.5, creatinine 1.8 Labs (10/21): K 4, creatinine 1.55, BNP 118 Labs (2/22): K 4.3, creatinine 1.85  PMH: 1. COPD: PFTs (6/19) with moderate-severe obstruction.  Prior heavy smoker, quit 2018.  - Uses oxygen with exertion.  2. Atrial fibrillation: Paroxysmal. DCCV in 10/19, now on flecainide.  - DCCV to NSR in 11/21.  3. HTN 4. Chronic diastolic CHF: Echo (4/74) with EF 55%, mild LVH, mild LV dilation, mild RV dilation with mildly decreased systolic function, bioprosthetic aortic valve with mean gradient 15 mmHg.  - RHC (11/20): mean RA 9, PA 41/15, mean PCWP 13, CI 2.81, PVR 2.15 WU.   - PYP scan not suggestive of cardiac amyloidosis.  - Echo (5/21): EF 55%, basal inferior akinesis, mild RV dilation with normal function, PASP 46 mmHg, s/p Bentall, s/p bioprosthetic aortic valve with mean gradient 16 mmHg.  - Echo (1/22): EF 60-65%, moderate RV enlargement with mildly decreased RV systolic function, PASP 44, bioprosthetic AVR with mean gradient 18 mmHg.  5. Type A aortic dissection: 8/18, patient had emergency repair with Bentall/bioprosthetic aortic valve.  Dissection extends down to abdominal aorta.  Coronaries re-implanted. Complicated by pericardial effusion requiring pericardiocentesis.  6. Chronic hyponatremia.  7. OSA: He is using CPAP.  8. ABIs (7/21): Normal  FH: Uncle with aortic dissection, mother with SCD at age 22.   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  Married, prior heavy smoker but quit in 2018.   ROS: All systems reviewed and negative except as per HPI.   Current Outpatient Medications  Medication Sig Dispense Refill  . albuterol (PROAIR HFA) 108 (90 Base)  MCG/ACT inhaler Inhale 2 puffs into the lungs every 4 (four) hours as needed for wheezing or shortness of breath. 54 g 3  . albuterol (PROVENTIL) (2.5 MG/3ML) 0.083% nebulizer solution Take 3 mLs (2.5 mg total) by nebulization every 8 (eight) hours as needed for wheezing or shortness of breath. 150 mL 1  . ALPRAZolam (XANAX) 1 MG tablet TAKE 1-1&1/2 TABLETS BY MOUTH AT BEDTIME 40 tablet 2  . apixaban (ELIQUIS) 5 MG TABS tablet Take 1 tablet (5 mg total) by mouth 2 (two) times daily. 180 tablet 2  . augmented betamethasone dipropionate (DIPROLENE-AF) 0.05 % cream Apply topically 2 (two) times daily as needed. 50 g 1  . bisacodyl (DULCOLAX) 5 MG EC tablet Take 5 mg by mouth daily as needed for moderate constipation.    . diphenhydrAMINE (BENADRYL) 25 MG tablet Take 25 mg by mouth every 8 (eight) hours as needed (allergies/runny nose).     Marland Kitchen escitalopram (LEXAPRO) 10 MG tablet TAKE 1 TABLET DAILY 90 tablet 3  . esomeprazole (NEXIUM) 40 MG capsule Take 1 capsule (40 mg total) by mouth daily before breakfast. 90 capsule 3  . flecainide (TAMBOCOR) 50 MG tablet TAKE ONE AND ONE-HALF TABLETS TWICE A DAY 270 tablet 1  . fluticasone (FLONASE) 50 MCG/ACT nasal spray Place 1 spray into both nostrils 2 (two) times daily as needed for allergies. Use one spray in each nostril twice daily.    Marland Kitchen ipratropium-albuterol (DUONEB) 0.5-2.5 (3) MG/3ML SOLN Take 3 mLs by nebulization 4 (four) times daily. 360 mL 5  . KLOR-CON M20 20 MEQ tablet TAKE 2 TABLETS (40 MEQ) DAILY 180 tablet 3  . losartan (COZAAR) 25 MG tablet Take 1 tablet (25 mg total) by mouth daily. 90 tablet 1  . metoprolol tartrate (LOPRESSOR) 25 MG tablet Take 0.5 tablets (12.5 mg total) by mouth 2 (two) times daily. Please make yearly appt with Dr. Caryl Comes for January 2022 for future refills. Thank you 1st attempt 180 tablet 1  . OXYGEN Inhale 2 L/min into the lungs as needed (for shortness of breath).    . rosuvastatin (CRESTOR) 5 MG tablet Take 1 tablet (5  mg total) by mouth daily. 90 tablet 1  . torsemide (DEMADEX) 20 MG tablet Take 40 mg (2 tabs) every other day ALTERNATING with 60 mg (3 tabs) every other day 270 tablet 3  . empagliflozin (JARDIANCE) 10 MG TABS tablet Take 1 tablet (10 mg total) by mouth daily before breakfast. 90 tablet 3   No current facility-administered medications for this encounter.   BP 140/70   Pulse (!) 52   Wt 89.6 kg (197 lb 9.6 oz)   SpO2 96% Comment: 3 l  n/c  BMI 26.80 kg/m  General: NAD Neck: JVP 7-8 cm, no thyromegaly or thyroid nodule.  Lungs: Bilateral rhonchi. CV: Nondisplaced PMI.  Heart regular S1/S2, no S3/S4, 3/6 SEM RUSB.  No peripheral edema.  No carotid bruit.  Normal pedal pulses.  Abdomen: Soft, nontender, no hepatosplenomegaly, no distention.  Skin: Intact without lesions or rashes.  Neurologic: Alert and oriented x 3.  Psych: Normal affect. Extremities: No clubbing or cyanosis.  HEENT: Normal.   Assessment/Plan: 1. Atrial fibrillation: Paroxysmal.  Back in NSR after DCCV in 11/21.  Feels better in NSR.     - Continue Eliquis 5 mg bid.  - Continue flecainide + metoprolol.  2. Chronic diastolic CHF: Echo in 0/22 with EF 55%, mildly decreased RV systolic function, normally functioning bioprosthetic aortic valve. RHC in 11/20 after increasing torsemide showed near-normal filling pressures.  PYP study was not suggestive of ATTR amyloidosis and myeloma panel was negative.  Last echo in 1/22 showed EF 60-65%, moderate RV enlargement with mildly decreased RV systolic function, PASP 44, bioprosthetic AVR with mean gradient 18 mmHg.  I suspect that COPD also contributes to his exercise intolerance.  Atrial fibrillation worsens his HF, he is in NSR today. NYHA class III symptoms, not significantly volume overloaded.  - Continue torsemide 60 mg daily alternating with 40 mg daily.   - Start Jardiance 10 mg daily with BMET today and in 10 days.   - I will arrange for Cardiomems placement for better  management of CHF.  Discussed risks/benefits with patient, will implant if insurance will cover.   3. COPD: Moderate to severe obstruction on 6/19 PFTs, prior smoker.  As above, I suspect this contributes to his dyspnea.  He is on home oxygen.  - Continue followup with pulmonary.  4. VVK:PQAESLPN CPAP.    5. Aortic dissection: S/p Type A aortic dissection, s/p Bentall with bioprosthetic aortic valve. Echo today showed stable bioprosthetic aortic valve.  Dr. Trula Slade has been following his residual dissected aorta.   - Given family history of aortic disease (uncle with aortic dissection, mother with SCD at age 12 - ?from dissection), his children's aortas need to be screened due to risk for inherited connective tissue disorder.  We have discussed this.  He is not Marfanoid.    Followup 6 wks with APP.    [name] 11/16/2020

## 2020-11-19 DIAGNOSIS — M1990 Unspecified osteoarthritis, unspecified site: Secondary | ICD-10-CM

## 2020-11-19 DIAGNOSIS — Z952 Presence of prosthetic heart valve: Secondary | ICD-10-CM

## 2020-11-19 DIAGNOSIS — N400 Enlarged prostate without lower urinary tract symptoms: Secondary | ICD-10-CM

## 2020-11-19 DIAGNOSIS — F32A Depression, unspecified: Secondary | ICD-10-CM | POA: Diagnosis not present

## 2020-11-19 DIAGNOSIS — Z9049 Acquired absence of other specified parts of digestive tract: Secondary | ICD-10-CM

## 2020-11-19 DIAGNOSIS — J449 Chronic obstructive pulmonary disease, unspecified: Secondary | ICD-10-CM | POA: Diagnosis not present

## 2020-11-19 DIAGNOSIS — G4733 Obstructive sleep apnea (adult) (pediatric): Secondary | ICD-10-CM | POA: Diagnosis not present

## 2020-11-19 DIAGNOSIS — E785 Hyperlipidemia, unspecified: Secondary | ICD-10-CM | POA: Diagnosis not present

## 2020-11-19 DIAGNOSIS — Z9181 History of falling: Secondary | ICD-10-CM

## 2020-11-19 DIAGNOSIS — K219 Gastro-esophageal reflux disease without esophagitis: Secondary | ICD-10-CM | POA: Diagnosis not present

## 2020-11-19 DIAGNOSIS — F418 Other specified anxiety disorders: Secondary | ICD-10-CM | POA: Diagnosis not present

## 2020-11-19 DIAGNOSIS — Z7901 Long term (current) use of anticoagulants: Secondary | ICD-10-CM

## 2020-11-19 DIAGNOSIS — T7840XD Allergy, unspecified, subsequent encounter: Secondary | ICD-10-CM

## 2020-11-19 DIAGNOSIS — Z7951 Long term (current) use of inhaled steroids: Secondary | ICD-10-CM

## 2020-11-19 DIAGNOSIS — Z9981 Dependence on supplemental oxygen: Secondary | ICD-10-CM

## 2020-11-19 DIAGNOSIS — I5032 Chronic diastolic (congestive) heart failure: Secondary | ICD-10-CM | POA: Diagnosis not present

## 2020-11-19 DIAGNOSIS — N1832 Chronic kidney disease, stage 3b: Secondary | ICD-10-CM | POA: Diagnosis not present

## 2020-11-19 DIAGNOSIS — I4891 Unspecified atrial fibrillation: Secondary | ICD-10-CM | POA: Diagnosis not present

## 2020-11-19 DIAGNOSIS — I130 Hypertensive heart and chronic kidney disease with heart failure and stage 1 through stage 4 chronic kidney disease, or unspecified chronic kidney disease: Secondary | ICD-10-CM | POA: Diagnosis not present

## 2020-11-19 DIAGNOSIS — R7301 Impaired fasting glucose: Secondary | ICD-10-CM

## 2020-11-19 DIAGNOSIS — J9611 Chronic respiratory failure with hypoxia: Secondary | ICD-10-CM | POA: Diagnosis not present

## 2020-11-19 DIAGNOSIS — I8390 Asymptomatic varicose veins of unspecified lower extremity: Secondary | ICD-10-CM | POA: Diagnosis not present

## 2020-11-19 DIAGNOSIS — E871 Hypo-osmolality and hyponatremia: Secondary | ICD-10-CM

## 2020-11-19 DIAGNOSIS — Z87891 Personal history of nicotine dependence: Secondary | ICD-10-CM

## 2020-11-20 ENCOUNTER — Telehealth: Payer: Self-pay

## 2020-11-20 DIAGNOSIS — I4891 Unspecified atrial fibrillation: Secondary | ICD-10-CM | POA: Diagnosis not present

## 2020-11-20 DIAGNOSIS — I5032 Chronic diastolic (congestive) heart failure: Secondary | ICD-10-CM | POA: Diagnosis not present

## 2020-11-20 DIAGNOSIS — J449 Chronic obstructive pulmonary disease, unspecified: Secondary | ICD-10-CM | POA: Diagnosis not present

## 2020-11-20 DIAGNOSIS — J9611 Chronic respiratory failure with hypoxia: Secondary | ICD-10-CM | POA: Diagnosis not present

## 2020-11-20 DIAGNOSIS — I130 Hypertensive heart and chronic kidney disease with heart failure and stage 1 through stage 4 chronic kidney disease, or unspecified chronic kidney disease: Secondary | ICD-10-CM | POA: Diagnosis not present

## 2020-11-20 DIAGNOSIS — N1832 Chronic kidney disease, stage 3b: Secondary | ICD-10-CM | POA: Diagnosis not present

## 2020-11-20 NOTE — Telephone Encounter (Signed)
Plan of care signed and faxed to [facility] at [phone]. Form sent for scanning.

## 2020-11-22 DIAGNOSIS — N1832 Chronic kidney disease, stage 3b: Secondary | ICD-10-CM | POA: Diagnosis not present

## 2020-11-22 DIAGNOSIS — I130 Hypertensive heart and chronic kidney disease with heart failure and stage 1 through stage 4 chronic kidney disease, or unspecified chronic kidney disease: Secondary | ICD-10-CM | POA: Diagnosis not present

## 2020-11-22 DIAGNOSIS — I5032 Chronic diastolic (congestive) heart failure: Secondary | ICD-10-CM | POA: Diagnosis not present

## 2020-11-22 DIAGNOSIS — I4891 Unspecified atrial fibrillation: Secondary | ICD-10-CM | POA: Diagnosis not present

## 2020-11-22 DIAGNOSIS — J449 Chronic obstructive pulmonary disease, unspecified: Secondary | ICD-10-CM | POA: Diagnosis not present

## 2020-11-22 DIAGNOSIS — J9611 Chronic respiratory failure with hypoxia: Secondary | ICD-10-CM | POA: Diagnosis not present

## 2020-11-25 ENCOUNTER — Other Ambulatory Visit (HOSPITAL_COMMUNITY): Payer: Medicare Other

## 2020-11-26 DIAGNOSIS — J9611 Chronic respiratory failure with hypoxia: Secondary | ICD-10-CM | POA: Diagnosis not present

## 2020-11-26 DIAGNOSIS — I4891 Unspecified atrial fibrillation: Secondary | ICD-10-CM | POA: Diagnosis not present

## 2020-11-26 DIAGNOSIS — J449 Chronic obstructive pulmonary disease, unspecified: Secondary | ICD-10-CM | POA: Diagnosis not present

## 2020-11-26 DIAGNOSIS — N1832 Chronic kidney disease, stage 3b: Secondary | ICD-10-CM | POA: Diagnosis not present

## 2020-11-26 DIAGNOSIS — I130 Hypertensive heart and chronic kidney disease with heart failure and stage 1 through stage 4 chronic kidney disease, or unspecified chronic kidney disease: Secondary | ICD-10-CM | POA: Diagnosis not present

## 2020-11-26 DIAGNOSIS — I5032 Chronic diastolic (congestive) heart failure: Secondary | ICD-10-CM | POA: Diagnosis not present

## 2020-11-28 ENCOUNTER — Ambulatory Visit (HOSPITAL_COMMUNITY)
Admission: RE | Admit: 2020-11-28 | Discharge: 2020-11-28 | Disposition: A | Discharge status: Home / Self Care | Payer: Medicare Other | Source: Ambulatory Visit | Attending: Internal Medicine | Admitting: Internal Medicine

## 2020-11-28 ENCOUNTER — Other Ambulatory Visit: Payer: Self-pay

## 2020-11-28 DIAGNOSIS — I5032 Chronic diastolic (congestive) heart failure: Secondary | ICD-10-CM | POA: Diagnosis not present

## 2020-11-28 DIAGNOSIS — N1832 Chronic kidney disease, stage 3b: Secondary | ICD-10-CM | POA: Diagnosis not present

## 2020-11-28 DIAGNOSIS — J449 Chronic obstructive pulmonary disease, unspecified: Secondary | ICD-10-CM | POA: Diagnosis not present

## 2020-11-28 DIAGNOSIS — I130 Hypertensive heart and chronic kidney disease with heart failure and stage 1 through stage 4 chronic kidney disease, or unspecified chronic kidney disease: Secondary | ICD-10-CM | POA: Diagnosis not present

## 2020-11-28 DIAGNOSIS — I5042 Chronic combined systolic (congestive) and diastolic (congestive) heart failure: Secondary | ICD-10-CM | POA: Diagnosis not present

## 2020-11-28 DIAGNOSIS — I4891 Unspecified atrial fibrillation: Secondary | ICD-10-CM | POA: Diagnosis not present

## 2020-11-28 DIAGNOSIS — I5022 Chronic systolic (congestive) heart failure: Secondary | ICD-10-CM

## 2020-11-28 DIAGNOSIS — J9611 Chronic respiratory failure with hypoxia: Secondary | ICD-10-CM | POA: Diagnosis not present

## 2020-11-28 LAB — BASIC METABOLIC PANEL
Anion gap: 8 (ref 5–15)
BUN: 21 mg/dL (ref 8–23)
CO2: 29 mmol/L (ref 22–32)
Calcium: 9.1 mg/dL (ref 8.9–10.3)
Chloride: 94 mmol/L — ABNORMAL LOW (ref 98–111)
Creatinine, Ser: 1.7 mg/dL — ABNORMAL HIGH (ref 0.61–1.24)
GFR, Estimated: 43 mL/min — ABNORMAL LOW (ref >60)
Glucose, Bld: 98 mg/dL (ref 70–99)
Potassium: 4.2 mmol/L (ref 3.5–5.1)
Sodium: 131 mmol/L — ABNORMAL LOW (ref 135–145)

## 2020-11-28 NOTE — Progress Notes (Signed)
Please call patient and let them  know their  low dose Ct was read as a Lung RADS 2: nodules that are benign in appearance and behavior with a very low likelihood of becoming a clinically active cancer due to size or lack of growth. Recommendation per radiology is for a repeat LDCT in 12 months. .Please let them  know we will order and schedule their  annual screening scan for 2021/12/03. Please let them  know there was notation of CAD on their  scan.  Please remind the patient  that this is a non-gated exam therefore degree or severity of disease  cannot be determined. Please have them  follow up with their PCP regarding potential risk factor modification, dietary therapy or pharmacologic therapy if clinically indicated. Pt.  is  currently on statin therapy. Please place order for annual  screening scan for 2021/12/03 and fax results to PCP. Thanks so much.  Have him follow up with PCP re: Specifically, there is aortic atherosclerosis, in addition to left main and 3 vessel coronary artery disease. Assessment for potential risk factor modification, dietary therapy or pharmacologic therapy may be warranted, if clinically indicated.

## 2020-11-29 ENCOUNTER — Other Ambulatory Visit: Payer: Self-pay

## 2020-11-29 ENCOUNTER — Other Ambulatory Visit: Payer: Self-pay | Admitting: Endocrinology

## 2020-11-29 ENCOUNTER — Other Ambulatory Visit: Payer: Self-pay | Admitting: Internal Medicine

## 2020-11-29 MED ORDER — FLECAINIDE ACETATE 50 MG PO TABS: ORAL_TABLET | ORAL | 2 refills | Status: DC

## 2020-12-03 ENCOUNTER — Other Ambulatory Visit: Payer: Self-pay | Admitting: *Deleted

## 2020-12-03 DIAGNOSIS — J9611 Chronic respiratory failure with hypoxia: Secondary | ICD-10-CM | POA: Diagnosis not present

## 2020-12-03 DIAGNOSIS — I130 Hypertensive heart and chronic kidney disease with heart failure and stage 1 through stage 4 chronic kidney disease, or unspecified chronic kidney disease: Secondary | ICD-10-CM | POA: Diagnosis not present

## 2020-12-03 DIAGNOSIS — Z87891 Personal history of nicotine dependence: Secondary | ICD-10-CM

## 2020-12-03 DIAGNOSIS — N1832 Chronic kidney disease, stage 3b: Secondary | ICD-10-CM | POA: Diagnosis not present

## 2020-12-03 DIAGNOSIS — I5032 Chronic diastolic (congestive) heart failure: Secondary | ICD-10-CM | POA: Diagnosis not present

## 2020-12-03 DIAGNOSIS — I4891 Unspecified atrial fibrillation: Secondary | ICD-10-CM | POA: Diagnosis not present

## 2020-12-03 DIAGNOSIS — J449 Chronic obstructive pulmonary disease, unspecified: Secondary | ICD-10-CM | POA: Diagnosis not present

## 2020-12-04 ENCOUNTER — Other Ambulatory Visit (HOSPITAL_COMMUNITY): Payer: Self-pay

## 2020-12-04 MED ORDER — EMPAGLIFLOZIN 10 MG PO TABS: 10.0000 mg | ORAL_TABLET | Freq: Every day | ORAL | 0 refills | Status: DC

## 2020-12-05 DIAGNOSIS — I5032 Chronic diastolic (congestive) heart failure: Secondary | ICD-10-CM | POA: Diagnosis not present

## 2020-12-05 DIAGNOSIS — J9611 Chronic respiratory failure with hypoxia: Secondary | ICD-10-CM | POA: Diagnosis not present

## 2020-12-05 DIAGNOSIS — I4891 Unspecified atrial fibrillation: Secondary | ICD-10-CM | POA: Diagnosis not present

## 2020-12-05 DIAGNOSIS — I130 Hypertensive heart and chronic kidney disease with heart failure and stage 1 through stage 4 chronic kidney disease, or unspecified chronic kidney disease: Secondary | ICD-10-CM | POA: Diagnosis not present

## 2020-12-05 DIAGNOSIS — N1832 Chronic kidney disease, stage 3b: Secondary | ICD-10-CM | POA: Diagnosis not present

## 2020-12-05 DIAGNOSIS — J449 Chronic obstructive pulmonary disease, unspecified: Secondary | ICD-10-CM | POA: Diagnosis not present

## 2020-12-06 ENCOUNTER — Ambulatory Visit: Payer: Medicare Other | Admitting: Sports Medicine

## 2020-12-08 DIAGNOSIS — G4733 Obstructive sleep apnea (adult) (pediatric): Secondary | ICD-10-CM | POA: Diagnosis not present

## 2020-12-08 DIAGNOSIS — I5032 Chronic diastolic (congestive) heart failure: Secondary | ICD-10-CM | POA: Diagnosis not present

## 2020-12-08 DIAGNOSIS — Z87891 Personal history of nicotine dependence: Secondary | ICD-10-CM | POA: Diagnosis not present

## 2020-12-08 DIAGNOSIS — R7301 Impaired fasting glucose: Secondary | ICD-10-CM | POA: Diagnosis not present

## 2020-12-08 DIAGNOSIS — E871 Hypo-osmolality and hyponatremia: Secondary | ICD-10-CM | POA: Diagnosis not present

## 2020-12-08 DIAGNOSIS — Z9049 Acquired absence of other specified parts of digestive tract: Secondary | ICD-10-CM | POA: Diagnosis not present

## 2020-12-08 DIAGNOSIS — E785 Hyperlipidemia, unspecified: Secondary | ICD-10-CM | POA: Diagnosis not present

## 2020-12-08 DIAGNOSIS — I8390 Asymptomatic varicose veins of unspecified lower extremity: Secondary | ICD-10-CM | POA: Diagnosis not present

## 2020-12-08 DIAGNOSIS — F32A Depression, unspecified: Secondary | ICD-10-CM | POA: Diagnosis not present

## 2020-12-08 DIAGNOSIS — Z9981 Dependence on supplemental oxygen: Secondary | ICD-10-CM | POA: Diagnosis not present

## 2020-12-08 DIAGNOSIS — F418 Other specified anxiety disorders: Secondary | ICD-10-CM | POA: Diagnosis not present

## 2020-12-08 DIAGNOSIS — Z9181 History of falling: Secondary | ICD-10-CM | POA: Diagnosis not present

## 2020-12-08 DIAGNOSIS — I130 Hypertensive heart and chronic kidney disease with heart failure and stage 1 through stage 4 chronic kidney disease, or unspecified chronic kidney disease: Secondary | ICD-10-CM | POA: Diagnosis not present

## 2020-12-08 DIAGNOSIS — M1990 Unspecified osteoarthritis, unspecified site: Secondary | ICD-10-CM | POA: Diagnosis not present

## 2020-12-08 DIAGNOSIS — N400 Enlarged prostate without lower urinary tract symptoms: Secondary | ICD-10-CM | POA: Diagnosis not present

## 2020-12-08 DIAGNOSIS — Z7901 Long term (current) use of anticoagulants: Secondary | ICD-10-CM | POA: Diagnosis not present

## 2020-12-08 DIAGNOSIS — J9611 Chronic respiratory failure with hypoxia: Secondary | ICD-10-CM | POA: Diagnosis not present

## 2020-12-08 DIAGNOSIS — T7840XD Allergy, unspecified, subsequent encounter: Secondary | ICD-10-CM | POA: Diagnosis not present

## 2020-12-08 DIAGNOSIS — Z952 Presence of prosthetic heart valve: Secondary | ICD-10-CM | POA: Diagnosis not present

## 2020-12-08 DIAGNOSIS — K219 Gastro-esophageal reflux disease without esophagitis: Secondary | ICD-10-CM | POA: Diagnosis not present

## 2020-12-08 DIAGNOSIS — N1832 Chronic kidney disease, stage 3b: Secondary | ICD-10-CM | POA: Diagnosis not present

## 2020-12-08 DIAGNOSIS — Z7951 Long term (current) use of inhaled steroids: Secondary | ICD-10-CM | POA: Diagnosis not present

## 2020-12-08 DIAGNOSIS — I4891 Unspecified atrial fibrillation: Secondary | ICD-10-CM | POA: Diagnosis not present

## 2020-12-08 DIAGNOSIS — J449 Chronic obstructive pulmonary disease, unspecified: Secondary | ICD-10-CM | POA: Diagnosis not present

## 2020-12-09 DIAGNOSIS — I130 Hypertensive heart and chronic kidney disease with heart failure and stage 1 through stage 4 chronic kidney disease, or unspecified chronic kidney disease: Secondary | ICD-10-CM | POA: Diagnosis not present

## 2020-12-09 DIAGNOSIS — I4891 Unspecified atrial fibrillation: Secondary | ICD-10-CM | POA: Diagnosis not present

## 2020-12-09 DIAGNOSIS — I5032 Chronic diastolic (congestive) heart failure: Secondary | ICD-10-CM | POA: Diagnosis not present

## 2020-12-09 DIAGNOSIS — J9611 Chronic respiratory failure with hypoxia: Secondary | ICD-10-CM | POA: Diagnosis not present

## 2020-12-09 DIAGNOSIS — N1832 Chronic kidney disease, stage 3b: Secondary | ICD-10-CM | POA: Diagnosis not present

## 2020-12-09 DIAGNOSIS — J449 Chronic obstructive pulmonary disease, unspecified: Secondary | ICD-10-CM | POA: Diagnosis not present

## 2020-12-11 ENCOUNTER — Ambulatory Visit: Payer: Medicare Other | Admitting: Internal Medicine

## 2020-12-12 ENCOUNTER — Other Ambulatory Visit: Payer: Self-pay

## 2020-12-12 DIAGNOSIS — J449 Chronic obstructive pulmonary disease, unspecified: Secondary | ICD-10-CM | POA: Diagnosis not present

## 2020-12-12 DIAGNOSIS — I712 Thoracic aortic aneurysm, without rupture, unspecified: Secondary | ICD-10-CM

## 2020-12-12 DIAGNOSIS — J9611 Chronic respiratory failure with hypoxia: Secondary | ICD-10-CM | POA: Diagnosis not present

## 2020-12-12 DIAGNOSIS — I5032 Chronic diastolic (congestive) heart failure: Secondary | ICD-10-CM | POA: Diagnosis not present

## 2020-12-12 DIAGNOSIS — I4891 Unspecified atrial fibrillation: Secondary | ICD-10-CM | POA: Diagnosis not present

## 2020-12-12 DIAGNOSIS — N1832 Chronic kidney disease, stage 3b: Secondary | ICD-10-CM | POA: Diagnosis not present

## 2020-12-12 DIAGNOSIS — I130 Hypertensive heart and chronic kidney disease with heart failure and stage 1 through stage 4 chronic kidney disease, or unspecified chronic kidney disease: Secondary | ICD-10-CM | POA: Diagnosis not present

## 2020-12-16 ENCOUNTER — Telehealth (HOSPITAL_COMMUNITY): Payer: Self-pay | Admitting: Cardiology

## 2020-12-16 DIAGNOSIS — I5022 Chronic systolic (congestive) heart failure: Secondary | ICD-10-CM

## 2020-12-16 NOTE — Telephone Encounter (Signed)
cardiomems approved  Implant scheduled for 12/26/20 @730  Arrival time 530A  covid test/labs 12/23/20  Pt aware via wife-instructions given verbally  Letter and instructions mailed

## 2020-12-17 DIAGNOSIS — I4891 Unspecified atrial fibrillation: Secondary | ICD-10-CM | POA: Diagnosis not present

## 2020-12-17 DIAGNOSIS — J449 Chronic obstructive pulmonary disease, unspecified: Secondary | ICD-10-CM | POA: Diagnosis not present

## 2020-12-17 DIAGNOSIS — I5032 Chronic diastolic (congestive) heart failure: Secondary | ICD-10-CM | POA: Diagnosis not present

## 2020-12-17 DIAGNOSIS — I130 Hypertensive heart and chronic kidney disease with heart failure and stage 1 through stage 4 chronic kidney disease, or unspecified chronic kidney disease: Secondary | ICD-10-CM | POA: Diagnosis not present

## 2020-12-17 DIAGNOSIS — J9611 Chronic respiratory failure with hypoxia: Secondary | ICD-10-CM | POA: Diagnosis not present

## 2020-12-17 DIAGNOSIS — N1832 Chronic kidney disease, stage 3b: Secondary | ICD-10-CM | POA: Diagnosis not present

## 2020-12-20 ENCOUNTER — Encounter (HOSPITAL_COMMUNITY): Payer: Self-pay | Admitting: *Deleted

## 2020-12-20 DIAGNOSIS — I4891 Unspecified atrial fibrillation: Secondary | ICD-10-CM | POA: Diagnosis not present

## 2020-12-20 DIAGNOSIS — N1832 Chronic kidney disease, stage 3b: Secondary | ICD-10-CM | POA: Diagnosis not present

## 2020-12-20 DIAGNOSIS — I5022 Chronic systolic (congestive) heart failure: Secondary | ICD-10-CM

## 2020-12-20 DIAGNOSIS — J449 Chronic obstructive pulmonary disease, unspecified: Secondary | ICD-10-CM | POA: Diagnosis not present

## 2020-12-20 DIAGNOSIS — I5032 Chronic diastolic (congestive) heart failure: Secondary | ICD-10-CM | POA: Diagnosis not present

## 2020-12-20 DIAGNOSIS — J9611 Chronic respiratory failure with hypoxia: Secondary | ICD-10-CM | POA: Diagnosis not present

## 2020-12-20 DIAGNOSIS — I130 Hypertensive heart and chronic kidney disease with heart failure and stage 1 through stage 4 chronic kidney disease, or unspecified chronic kidney disease: Secondary | ICD-10-CM | POA: Diagnosis not present

## 2020-12-23 ENCOUNTER — Other Ambulatory Visit: Payer: Self-pay

## 2020-12-23 ENCOUNTER — Other Ambulatory Visit (HOSPITAL_COMMUNITY): Payer: Medicare Other

## 2020-12-23 ENCOUNTER — Other Ambulatory Visit (HOSPITAL_COMMUNITY)
Admission: RE | Admit: 2020-12-23 | Discharge: 2020-12-23 | Disposition: A | Discharge status: Home / Self Care | Payer: Medicare Other | Source: Ambulatory Visit | Attending: Cardiology | Admitting: Cardiology

## 2020-12-23 ENCOUNTER — Ambulatory Visit (HOSPITAL_COMMUNITY)
Admission: RE | Admit: 2020-12-23 | Discharge: 2020-12-23 | Disposition: A | Discharge status: Home / Self Care | Payer: Medicare Other | Source: Ambulatory Visit | Attending: Cardiology | Admitting: Cardiology

## 2020-12-23 DIAGNOSIS — Z01812 Encounter for preprocedural laboratory examination: Secondary | ICD-10-CM | POA: Insufficient documentation

## 2020-12-23 DIAGNOSIS — I5022 Chronic systolic (congestive) heart failure: Secondary | ICD-10-CM | POA: Insufficient documentation

## 2020-12-23 DIAGNOSIS — Z20822 Contact with and (suspected) exposure to covid-19: Secondary | ICD-10-CM | POA: Insufficient documentation

## 2020-12-23 LAB — PROTIME-INR
INR: 1.3 — ABNORMAL HIGH (ref 0.8–1.2)
Prothrombin Time: 16.6 seconds — ABNORMAL HIGH (ref 11.4–15.2)

## 2020-12-23 LAB — CBC
HCT: 37.3 % — ABNORMAL LOW (ref 39.0–52.0)
Hemoglobin: 12.3 g/dL — ABNORMAL LOW (ref 13.0–17.0)
MCH: 32.1 pg (ref 26.0–34.0)
MCHC: 33 g/dL (ref 30.0–36.0)
MCV: 97.4 fL (ref 80.0–100.0)
Platelets: 237 10*3/uL (ref 150–400)
RBC: 3.83 MIL/uL — ABNORMAL LOW (ref 4.22–5.81)
RDW: 14.3 % (ref 11.5–15.5)
WBC: 6.4 10*3/uL (ref 4.0–10.5)
nRBC: 0 % (ref 0.0–0.2)

## 2020-12-23 LAB — BASIC METABOLIC PANEL
Anion gap: 8 (ref 5–15)
BUN: 23 mg/dL (ref 8–23)
CO2: 29 mmol/L (ref 22–32)
Calcium: 9.1 mg/dL (ref 8.9–10.3)
Chloride: 95 mmol/L — ABNORMAL LOW (ref 98–111)
Creatinine, Ser: 1.95 mg/dL — ABNORMAL HIGH (ref 0.61–1.24)
GFR, Estimated: 36 mL/min — ABNORMAL LOW (ref >60)
Glucose, Bld: 79 mg/dL (ref 70–99)
Potassium: 4.8 mmol/L (ref 3.5–5.1)
Sodium: 132 mmol/L — ABNORMAL LOW (ref 135–145)

## 2020-12-24 LAB — SARS CORONAVIRUS 2 (TAT 6-24 HRS): SARS Coronavirus 2: NEGATIVE

## 2020-12-26 ENCOUNTER — Encounter (HOSPITAL_COMMUNITY)
Admission: RE | Disposition: A | Discharge status: Home / Self Care | Payer: Self-pay | Source: Home / Self Care | Attending: Cardiology

## 2020-12-26 ENCOUNTER — Other Ambulatory Visit: Payer: Self-pay

## 2020-12-26 ENCOUNTER — Ambulatory Visit (HOSPITAL_COMMUNITY)
Admission: RE | Admit: 2020-12-26 | Discharge: 2020-12-26 | Disposition: A | Discharge status: Home / Self Care | Payer: Medicare Other | Attending: Cardiology | Admitting: Cardiology

## 2020-12-26 DIAGNOSIS — E785 Hyperlipidemia, unspecified: Secondary | ICD-10-CM | POA: Insufficient documentation

## 2020-12-26 DIAGNOSIS — I4891 Unspecified atrial fibrillation: Secondary | ICD-10-CM | POA: Diagnosis not present

## 2020-12-26 DIAGNOSIS — I2510 Atherosclerotic heart disease of native coronary artery without angina pectoris: Secondary | ICD-10-CM | POA: Diagnosis not present

## 2020-12-26 DIAGNOSIS — N400 Enlarged prostate without lower urinary tract symptoms: Secondary | ICD-10-CM | POA: Insufficient documentation

## 2020-12-26 DIAGNOSIS — Z981 Arthrodesis status: Secondary | ICD-10-CM | POA: Diagnosis not present

## 2020-12-26 DIAGNOSIS — I509 Heart failure, unspecified: Secondary | ICD-10-CM | POA: Diagnosis not present

## 2020-12-26 DIAGNOSIS — Z886 Allergy status to analgesic agent status: Secondary | ICD-10-CM | POA: Diagnosis not present

## 2020-12-26 DIAGNOSIS — Z842 Family history of other diseases of the genitourinary system: Secondary | ICD-10-CM | POA: Insufficient documentation

## 2020-12-26 DIAGNOSIS — Z91040 Latex allergy status: Secondary | ICD-10-CM | POA: Insufficient documentation

## 2020-12-26 DIAGNOSIS — Z79899 Other long term (current) drug therapy: Secondary | ICD-10-CM | POA: Insufficient documentation

## 2020-12-26 DIAGNOSIS — I110 Hypertensive heart disease with heart failure: Secondary | ICD-10-CM | POA: Insufficient documentation

## 2020-12-26 DIAGNOSIS — Z7901 Long term (current) use of anticoagulants: Secondary | ICD-10-CM | POA: Insufficient documentation

## 2020-12-26 DIAGNOSIS — Z881 Allergy status to other antibiotic agents status: Secondary | ICD-10-CM | POA: Diagnosis not present

## 2020-12-26 DIAGNOSIS — Z888 Allergy status to other drugs, medicaments and biological substances status: Secondary | ICD-10-CM | POA: Insufficient documentation

## 2020-12-26 DIAGNOSIS — Z87891 Personal history of nicotine dependence: Secondary | ICD-10-CM | POA: Diagnosis not present

## 2020-12-26 DIAGNOSIS — I5022 Chronic systolic (congestive) heart failure: Secondary | ICD-10-CM

## 2020-12-26 DIAGNOSIS — Z8249 Family history of ischemic heart disease and other diseases of the circulatory system: Secondary | ICD-10-CM | POA: Diagnosis not present

## 2020-12-26 DIAGNOSIS — Z9981 Dependence on supplemental oxygen: Secondary | ICD-10-CM | POA: Diagnosis not present

## 2020-12-26 DIAGNOSIS — Z885 Allergy status to narcotic agent status: Secondary | ICD-10-CM | POA: Insufficient documentation

## 2020-12-26 HISTORY — PX: PRESSURE SENSOR/CARDIOMEMS: CATH118258

## 2020-12-26 LAB — POCT I-STAT EG7
Acid-Base Excess: 1 mmol/L (ref 0.0–2.0)
Acid-Base Excess: 1 mmol/L (ref 0.0–2.0)
Bicarbonate: 28.6 mmol/L — ABNORMAL HIGH (ref 20.0–28.0)
Bicarbonate: 28.2 mmol/L — ABNORMAL HIGH (ref 20.0–28.0)
Calcium, Ion: 1.16 mmol/L (ref 1.15–1.40)
Calcium, Ion: 1.14 mmol/L — ABNORMAL LOW (ref 1.15–1.40)
HCT: 37 % — ABNORMAL LOW (ref 39.0–52.0)
HCT: 37 % — ABNORMAL LOW (ref 39.0–52.0)
Hemoglobin: 12.6 g/dL — ABNORMAL LOW (ref 13.0–17.0)
Hemoglobin: 12.6 g/dL — ABNORMAL LOW (ref 13.0–17.0)
O2 Saturation: 62 %
O2 Saturation: 62 %
Potassium: 4.6 mmol/L (ref 3.5–5.1)
Potassium: 4.6 mmol/L (ref 3.5–5.1)
Sodium: 132 mmol/L — ABNORMAL LOW (ref 135–145)
Sodium: 132 mmol/L — ABNORMAL LOW (ref 135–145)
TCO2: 30 mmol/L (ref 22–32)
TCO2: 30 mmol/L (ref 22–32)
pCO2, Ven: 55.5 mmHg (ref 44.0–60.0)
pCO2, Ven: 54.9 mmHg (ref 44.0–60.0)
pH, Ven: 7.32 (ref 7.250–7.430)
pH, Ven: 7.319 (ref 7.250–7.430)
pO2, Ven: 35 mmHg (ref 32.0–45.0)
pO2, Ven: 35 mmHg (ref 32.0–45.0)

## 2020-12-26 LAB — GLUCOSE, CAPILLARY
Glucose-Capillary: 64 mg/dL — ABNORMAL LOW (ref 70–99)
Glucose-Capillary: 79 mg/dL (ref 70–99)

## 2020-12-26 SURGERY — PRESSURE SENSOR/CARDIOMEMS
Anesthesia: LOCAL

## 2020-12-26 MED ORDER — ASPIRIN 81 MG PO CHEW: 81.0000 mg | CHEWABLE_TABLET | Freq: Every day | ORAL | 0 refills | Status: DC

## 2020-12-26 MED ORDER — SODIUM CHLORIDE 0.9% FLUSH: 3.0000 mL | Freq: Two times a day (BID) | INTRAVENOUS | Status: DC

## 2020-12-26 MED ORDER — HEPARIN (PORCINE) IN NACL 1000-0.9 UT/500ML-% IV SOLN
INTRAVENOUS | Status: AC
  Filled 2020-12-26: qty 500

## 2020-12-26 MED ORDER — TORSEMIDE 20 MG PO TABS: 60.0000 mg | ORAL_TABLET | Freq: Every day | ORAL | 3 refills | Status: DC

## 2020-12-26 MED ORDER — HEPARIN (PORCINE) IN NACL 1000-0.9 UT/500ML-% IV SOLN
INTRAVENOUS | Status: AC
  Filled 2020-12-26: qty 1000

## 2020-12-26 MED ORDER — HYDRALAZINE HCL 20 MG/ML IJ SOLN: 10.0000 mg | INTRAMUSCULAR | Status: DC | PRN

## 2020-12-26 MED ORDER — ASPIRIN 81 MG PO CHEW: 81.0000 mg | CHEWABLE_TABLET | Freq: Every day | ORAL | Status: DC

## 2020-12-26 MED ORDER — FENTANYL CITRATE (PF) 100 MCG/2ML IJ SOLN
INTRAMUSCULAR | Status: DC | PRN
  Administered 2020-12-26: 25 ug via INTRAVENOUS
  Administered 2020-12-26: 12.5 ug via INTRAVENOUS
  Administered 2020-12-26: 25 ug via INTRAVENOUS

## 2020-12-26 MED ORDER — HEPARIN (PORCINE) IN NACL 1000-0.9 UT/500ML-% IV SOLN
INTRAVENOUS | Status: DC | PRN
  Administered 2020-12-26: 500 mL via INTRAMUSCULAR

## 2020-12-26 MED ORDER — SODIUM CHLORIDE 0.9% FLUSH: 3.0000 mL | INTRAVENOUS | Status: DC | PRN

## 2020-12-26 MED ORDER — SODIUM CHLORIDE 0.9 % IV SOLN: INTRAVENOUS | Status: DC

## 2020-12-26 MED ORDER — SODIUM CHLORIDE 0.9 % IV SOLN: 250.0000 mL | INTRAVENOUS | Status: DC | PRN

## 2020-12-26 MED ORDER — IOHEXOL 350 MG/ML SOLN
INTRAVENOUS | Status: DC | PRN
  Administered 2020-12-26: 5 mL

## 2020-12-26 MED ORDER — ACETAMINOPHEN 325 MG PO TABS: 650.0000 mg | ORAL_TABLET | ORAL | Status: DC | PRN

## 2020-12-26 MED ORDER — ELIQUIS 5 MG PO TABS: 5.0000 mg | ORAL_TABLET | Freq: Two times a day (BID) | ORAL | 2 refills | Status: DC

## 2020-12-26 MED ORDER — FENTANYL CITRATE (PF) 100 MCG/2ML IJ SOLN
INTRAMUSCULAR | Status: AC
  Filled 2020-12-26: qty 2

## 2020-12-26 MED ORDER — MIDAZOLAM HCL 2 MG/2ML IJ SOLN
INTRAMUSCULAR | Status: AC
  Filled 2020-12-26: qty 2

## 2020-12-26 MED ORDER — LIDOCAINE HCL (PF) 1 % IJ SOLN
INTRAMUSCULAR | Status: AC
  Filled 2020-12-26: qty 30

## 2020-12-26 MED ORDER — HEPARIN (PORCINE) IN NACL 1000-0.9 UT/500ML-% IV SOLN
INTRAVENOUS | Status: DC | PRN
  Administered 2020-12-26 (×2): 500 mL

## 2020-12-26 MED ORDER — LIDOCAINE HCL (PF) 1 % IJ SOLN
INTRAMUSCULAR | Status: DC | PRN
  Administered 2020-12-26: 15 mL

## 2020-12-26 MED ORDER — ONDANSETRON HCL 4 MG/2ML IJ SOLN: 4.0000 mg | Freq: Four times a day (QID) | INTRAMUSCULAR | Status: DC | PRN

## 2020-12-26 MED ORDER — MIDAZOLAM HCL 2 MG/2ML IJ SOLN
INTRAMUSCULAR | Status: DC | PRN
  Administered 2020-12-26: 1 mg via INTRAVENOUS

## 2020-12-26 MED ORDER — LABETALOL HCL 5 MG/ML IV SOLN: 10.0000 mg | INTRAVENOUS | Status: DC | PRN

## 2020-12-26 SURGICAL SUPPLY — 9 items
CARDIOMEMS PA SENSOR W/DELIVER (Prosthesis & Implant Heart) ×2 IMPLANT
CATH SWAN GANZ 7F STRAIGHT (CATHETERS) ×1 IMPLANT
SENSOR CARDIOMEMS PA W/DELIVER (Prosthesis & Implant Heart) IMPLANT
SHEATH FAST CATH 12F 12CM (SHEATH) ×1 IMPLANT
SHEATH PINNACLE 7F 10CM (SHEATH) ×1 IMPLANT
SHEATH PROBE COVER 6X72 (BAG) ×1 IMPLANT
WIRE EMERALD 3MM-J .025X260CM (WIRE) ×1 IMPLANT
WIRE EMERALD 3MM-J .035X150CM (WIRE) ×1 IMPLANT
WIRE NITREX .018X300 STIFF (WIRE) ×1 IMPLANT

## 2020-12-26 NOTE — H&P (Signed)
Advanced Heart Failure Team History and Physical Note   PCP:  Colon Branch, MD  PCP-Cardiology: None      HPI:    Patient arrives for Cardiomems pressure sensor placement today.  No changes in symptoms.     Home Medications Prior to Admission medications   Medication Sig Start Date End Date Taking? Authorizing Provider  albuterol (PROAIR HFA) 108 (90 Base) MCG/ACT inhaler Inhale 2 puffs into the lungs every 4 (four) hours as needed for wheezing or shortness of breath. 03/30/19  Yes Colon Branch, MD  albuterol (PROVENTIL) (2.5 MG/3ML) 0.083% nebulizer solution Take 3 mLs (2.5 mg total) by nebulization every 8 (eight) hours as needed for wheezing or shortness of breath. 04/30/20  Yes Paz, Alda Berthold, MD  ALPRAZolam Duanne Moron) 1 MG tablet TAKE 1-1&1/2 TABLETS BY MOUTH AT BEDTIME Patient taking differently: Take 1 mg by mouth at bedtime as needed for sleep. 10/30/20  Yes Paz, Alda Berthold, MD  apixaban (ELIQUIS) 5 MG TABS tablet Take 1 tablet (5 mg total) by mouth 2 (two) times daily. 07/23/20  Yes Larey Dresser, MD  augmented betamethasone dipropionate (DIPROLENE-AF) 0.05 % cream Apply topically 2 (two) times daily as needed. Patient taking differently: Apply 1 application topically 2 (two) times daily as needed (back rash). 12/25/19  Yes Paz, Alda Berthold, MD  diphenhydrAMINE (BENADRYL) 25 MG tablet Take 25 mg by mouth every 8 (eight) hours as needed (allergies/runny nose).    Yes [provider]  empagliflozin (JARDIANCE) 10 MG TABS tablet Take 1 tablet (10 mg total) by mouth daily before breakfast. 12/04/20  Yes Larey Dresser, MD  escitalopram (LEXAPRO) 10 MG tablet TAKE 1 TABLET DAILY Patient taking differently: Take 10 mg by mouth in the morning. 09/19/20  Yes Colon Branch, MD  esomeprazole (NEXIUM) 40 MG capsule Take 1 capsule (40 mg total) by mouth daily before breakfast. 01/05/17  Yes Kozlow, Donnamarie Poag, MD  flecainide (TAMBOCOR) 50 MG tablet TAKE ONE AND ONE-HALF TABLETS TWICE A DAY Patient  taking differently: Take 75 mg by mouth 2 (two) times daily. 11/29/20  Yes Deboraha Sprang, MD  fluticasone [facility]) 50 MCG/ACT nasal spray Place 1 spray into both nostrils 2 (two) times daily as needed for allergies. Use one spray in each nostril twice daily. 05/31/18  Yes Turner, Traci R, MD  ipratropium-albuterol (DUONEB) 0.5-2.5 (3) MG/3ML SOLN Take 3 mLs by nebulization 4 (four) times daily. 06/11/20  Yes Mannam, Praveen, MD  KLOR-CON M20 20 MEQ tablet TAKE 2 TABLETS (40 MEQ) DAILY Patient taking differently: Take 40 mEq by mouth at bedtime. 05/17/20  Yes Larey Dresser, MD  losartan (COZAAR) 25 MG tablet Take 1 tablet (25 mg total) by mouth daily. Patient taking differently: Take 25 mg by mouth in the morning. 11/18/20  Yes Paz, Alda Berthold, MD  metoprolol tartrate (LOPRESSOR) 25 MG tablet Take 0.5 tablets (12.5 mg total) by mouth 2 (two) times daily. Please make yearly appt with Dr. Caryl Comes for January 2022 for future refills. Thank you 1st attempt 09/08/20  Yes Shelly Coss, MD  OXYGEN Inhale 2 L/min into the lungs as needed (for shortness of breath).   Yes [provider]  rosuvastatin (CRESTOR) 5 MG tablet Take 1 tablet (5 mg total) by mouth daily. Patient taking differently: Take 5 mg by mouth at bedtime. 11/29/20  Yes Colon Branch, MD  torsemide (DEMADEX) 20 MG tablet Take 40 mg (2 tabs) every other day ALTERNATING with 60 mg (3  tabs) every other day Patient taking differently: Take 40-60 mg by mouth See admin instructions. Take 40 mg (2 tabs) every other day ALTERNATING with 60 mg (3 tabs) every other day 06/04/20  Yes Larey Dresser, MD  bisacodyl (DULCOLAX) 5 MG EC tablet Take 5 mg by mouth daily as needed for moderate constipation.    [provider]  hydroxypropyl methylcellulose / hypromellose (ISOPTO TEARS / GONIOVISC) 2.5 % ophthalmic solution Place 1-2 drops into both eyes 3 (three) times daily as needed (dry/irritated eyes).    [provider]  tamoxifen  (NOLVADEX) 10 MG tablet TAKE 1 TABLET TWICE A DAY Patient not taking: Reported on 12/19/2020 11/29/20   Renato Shin, MD    Past Medical History: Past Medical History:  Diagnosis Date  . Allergy   . Arthritis   . Ascending aortic dissection ([place]) 03/23/2017  . Atrial fibrillation ([place])   . Cataract    removed both eyes   . CHF (congestive heart failure) ([place])   . GERD (gastroesophageal reflux disease)   . HYPERLIPIDEMIA   . 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 Hypertension   . Microscopic hematuria    negative cystoscopy  . NONSPECIFIC ABNORMAL ELECTROCARDIOGRAM   . Pleural effusion on right   . Pleural effusion, bilateral   . S/P aortic dissection repair 03/24/2017   Biological Bentall aortic root replacement + resection and grafting of entire ascending aorta, transverse aortic arch and proximal descending thoracic aorta with elephant trunk distal anastomosis and debranching of aortic arch vessels  . 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 Varicose veins     Past Surgical History: Past Surgical History:  Procedure Laterality Date  . APPENDECTOMY    . CARDIAC CATHETERIZATION      X3;Dr Caryl Comes, last 01/20/12: mild non-obstructive CAD, normal LV systolic function  . CARDIOVERSION N/A 05/31/2018   Procedure: CARDIOVERSION;  Surgeon: Sueanne Margarita, MD;  Location: [facility] ENDOSCOPY;  Service: Cardiovascular;  Laterality: N/A;  . CARDIOVERSION N/A 06/13/2020   Procedure: CARDIOVERSION;  Surgeon: Larey Dresser, MD;  Location: [facility] ENDOSCOPY;  Service: Cardiovascular;  Laterality: N/A;  . COLONOSCOPY    . CYSTOSCOPY  1978   Dr Hartley Barefoot  . EYE SURGERY    . IR THORACENTESIS ASP PLEURAL SPACE W/IMG GUIDE  11/25/2017  . KNEE ARTHROSCOPY  2012   Dr Theda Sers  . LUMBAR LAMINECTOMY   1987   Dr Durward Fortes  . MYELOGRAM  2007  . PERICARDIOCENTESIS N/A 04/07/2017   Procedure: PERICARDIOCENTESIS;  Surgeon: Sherren Mocha, MD;  Location: [place] CV LAB;  Service: Cardiovascular;  Laterality: N/A;  . POLYPECTOMY    . REPAIR OF ACUTE ASCENDING THORACIC AORTIC DISSECTION N/A 03/23/2017   Procedure: REPAIR OF ACUTE ASCENDING THORACIC AORTIC DISSECTION.  Bentall procedure.  Aortic root repleacement with bioprosthetic valve.  Reimplantation of left and right coronary arteries.  Total resection of transverse aortic arch.  Elephant trunk distal anastomosis and debranching of arch vessels.;  Surgeon: Rexene Alberts, MD;  Location: [place];  Service: Vascular;  Laterality: N/A;  . RIGHT HEART CATH N/A 06/15/2019   Procedure: RIGHT HEART CATH;  Surgeon: Larey Dresser, MD;  Location: [place] CV LAB;  Service: Cardiovascular;  Laterality: N/A;  . ROTATOR CUFF REPAIR    .  SHOULDER ARTHROSCOPY  2011   Dr Theda Sers  . SHOULDER ARTHROSCOPY Right 08/2015  . SPINAL FUSION  1986   Dr Rolin Barry  . TEE WITHOUT CARDIOVERSION N/A 03/23/2017   Procedure: TRANSESOPHAGEAL ECHOCARDIOGRAM (TEE);  Surgeon: Rexene Alberts, MD;  Location: [facility];  Service: Open Heart Surgery;  Laterality: N/A;  . THORACIC AORTIC ENDOVASCULAR STENT GRAFT N/A 03/30/2018   Procedure: THORACIC AORTIC ENDOVASCULAR STENT GRAFT WITH INTRAVASCULAR ULTRASOUND;  Surgeon: Serafina Mitchell, MD;  Location: [facility] OR;  Service: Vascular;  Laterality: N/A;  . TRACHEOSTOMY     age 63 for croup    Family History:  Family History  Problem Relation Age of Onset  . Hypertension Mother   . Hypertension Father   . Benign prostatic hyperplasia Father        S/P TURP  . Heart attack Maternal Grandmother        MI in 6s  . Breast cancer Maternal Grandmother   . Arrhythmia Brother         X 2  . Heart attack Maternal Aunt        MI in 65s  . Stroke Neg Hx   . Diabetes Neg Hx   . Colon cancer Neg Hx   . Colon polyps Neg Hx   . Stomach  cancer Neg Hx   . Rectal cancer Neg Hx   . Esophageal cancer Neg Hx   . Prostate cancer Neg Hx   . Other Neg Hx        gynecomastia    Social History: Social History   Socioeconomic History  . Marital status: Married    Spouse name: Not on file  . Number of children: 2  . Years of education: Not on file  . Highest education level: Not on file  Occupational History  . 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 Smoking status: Former Smoker    Packs/day: 2.00    Years: 45.00    Pack years: 90.00    Types: Cigarettes    Quit date: 03/22/2017    Years since quitting: 3.7  . Smokeless tobacco: Never Used  . Tobacco comment: 2 ppd , quit 03/2017  Vaping Use  . Vaping Use: Never used  Substance and Sexual Activity  . Alcohol use: Not Currently    Alcohol/week: 0.0 standard drinks    Comment: 5 cans of beer daily  . Drug use: No  . Sexual activity: Never  Other Topics Concern  . Not on file  Social History Narrative   Lives w/ wife   Social Determinants of Health   Financial Resource Strain: Not on file  Food Insecurity: Not on file  Transportation Needs: Not on file  Physical Activity: Not on file  Stress: Not on file  Social Connections: Not on file    Allergies:  Allergies  Allergen Reactions  . Simvastatin Other (See Comments)    Mental status changes  . Celecoxib Other (See Comments)    GI UPSET AND INFLAMMATION  . Codeine Other (See Comments)    GI UPSET AND INFLAMMATION  . Nsaids Other (See Comments)    GI UPSET AND INFLAMMATION (can tolerate via IV)  . Tape Other (See Comments)    Medical tape and Band-Aids PULL OFF THE SKIN; please use Coban wrap  . Cephalexin Itching and Rash  . Latex Rash    Objective:    Vital Signs:   Temp:  [97.3 F (36.3 C)] 97.3 F (36.3 C) (  05/19 0601) Pulse Rate:  [57] 57 (05/19 0601) BP: (125)/(52) 125/52 (05/19 0601) SpO2:  [95 %] 95 % (05/19 0601) Weight:  [88.5 kg] 88.5 kg (05/19 0601)   Filed  Weights   12/26/20 0601  Weight: 88.5 kg     Physical Exam     General:  Well appearing. No respiratory difficulty HEENT: Normal Neck: Supple. JVP 8 cm. Carotids 2+ bilat; no bruits. No lymphadenopathy or thyromegaly appreciated. Cor: PMI nondisplaced. Regular rate & rhythm. No rubs, gallops or murmurs. Lungs: Clear Abdomen: Soft, nontender, nondistended. No hepatosplenomegaly. No bruits or masses. Good bowel sounds. Extremities: No cyanosis, clubbing, rash, edema Neuro: Alert & oriented x 3, cranial nerves grossly intact. moves all 4 extremities w/o difficulty. Affect pleasant.   Labs     Basic Metabolic Panel: Recent Labs  Lab 12/23/20 1038  NA 132*  K 4.8  CL 95*  CO2 29  GLUCOSE 79  BUN 23  CREATININE 1.95*  CALCIUM 9.1    Liver Function Tests: No results for input(s): AST, ALT, ALKPHOS, BILITOT, PROT, ALBUMIN in the last 168 hours. No results for input(s): LIPASE, AMYLASE in the last 168 hours. No results for input(s): AMMONIA in the last 168 hours.  CBC: Recent Labs  Lab 12/23/20 1038  WBC 6.4  HGB 12.3*  HCT 37.3*  MCV 97.4  PLT 237    Cardiac Enzymes: No results for input(s): CKTOTAL, CKMB, CKMBINDEX, TROPONINI in the last 168 hours.  BNP: BNP (last 3 results) Recent Labs    06/04/20 1212 09/05/20 2332 11/15/20 1117  BNP 118.1* 354.4* 101.8*    ProBNP (last 3 results) No results for input(s): PROBNP in the last 8760 hours.   CBG: No results for input(s): GLUCAP in the last 168 hours.  Coagulation Studies: Recent Labs    12/23/20 1038  LABPROT 16.6*  INR 1.3*    Imaging:  No results found.    Assessment/Plan   70 y.o. with history of CHF arrives for Cardiomems pressure sensor placement.    [name], MD 12/26/2020, 7:32 AM  Advanced Heart Failure Team Pager [phone] (M-F; 7a - 5p)  Please contact [facility] for night-coverage after hours (4p -7a ) and weekends on [email or web address]

## 2020-12-26 NOTE — Procedures (Signed)
SITE AREA: right groin/femoral  SITE PRIOR TO REMOVAL:  LEVEL 0  PRESSURE APPLIED FOR: approximately 12 minutes  MANUAL: yes  PATIENT STATUS DURING PULL: stable  POST PULL SITE:  LEVEL 0  POST PULL INSTRUCTIONS GIVEN: yes  POST PULL PULSES PRESENT: right pedal pulse @ +2  DRESSING APPLIED: gauze with tegaderm  BEDREST BEGINS @ 1018   COMMENTS:

## 2020-12-26 NOTE — Discharge Instructions (Signed)
1. Restart Eliquis tomorrow morning.  2. Take aspirin 81 mg daily for 1 month then stop.  3. Increase torsemide to 60 mg daily (3 tabs).    Femoral Site Care  This sheet gives you information about how to care for yourself after your procedure. Your health care provider may also give you more specific instructions. If you have problems or questions, contact your health care provider. What can I expect after the procedure? After the procedure, it is common to have:  Bruising that usually fades within 1-2 weeks.  Tenderness at the site. Follow these instructions at home: Wound care  Follow instructions from your health care provider about how to take care of your insertion site. Make sure you: ? Wash your hands with soap and water before you change your bandage (dressing). If soap and water are not available, use hand sanitizer. ? Change your dressing as told by your health care provider. ? Leave stitches (sutures), skin glue, or adhesive strips in place. These skin closures may need to stay in place for 2 weeks or longer. If adhesive strip edges start to loosen and curl up, you may trim the loose edges. Do not remove adhesive strips completely unless your health care provider tells you to do that.  Do not take baths, swim, or use a hot tub until your health care provider approves.  You may shower 24-48 hours after the procedure or as told by your health care provider. ? Gently wash the site with plain soap and water. ? Pat the area dry with a clean towel. ? Do not rub the site. This may cause bleeding.  Do not apply powder or lotion to the site. Keep the site clean and dry.  Check your femoral site every day for signs of infection. Check for: ? Redness, swelling, or pain. ? Fluid or blood. ? Warmth. ? Pus or a bad smell. Activity  For the first 2-3 days after your procedure, or as long as directed: ? Avoid climbing stairs as much as possible. ? Do not squat.  Do not lift  anything that is heavier than 10 lb (4.5 kg), or the limit that you are told, until your health care provider says that it is safe.  Rest as directed. ? Avoid sitting for a long time without moving. Get up to take short walks every 1-2 hours.  Do not drive for 24 hours if you were given a medicine to help you relax (sedative). General instructions  Take over-the-counter and prescription medicines only as told by your health care provider.  Keep all follow-up visits as told by your health care provider. This is important. Contact a health care provider if you have:  A fever or chills.  You have redness, swelling, or pain around your insertion site. Get help right away if:  The catheter insertion area swells very fast.  You pass out.  You suddenly start to sweat or your skin gets clammy.  The catheter insertion area is bleeding, and the bleeding does not stop when you hold steady pressure on the area.  The area near or just beyond the catheter insertion site becomes pale, cool, tingly, or numb. These symptoms may represent a serious problem that is an emergency. Do not wait to see if the symptoms will go away. Get medical help right away. Call your local emergency services (911 in the [place] Do not drive yourself to the hospital. Summary  After the procedure, it is common to have bruising that  usually fades within 1-2 weeks.  Check your femoral site every day for signs of infection.  Do not lift anything that is heavier than 10 lb (4.5 kg), or the limit that you are told, until your health care provider says that it is safe. This information is not intended to replace advice given to you by your health care provider. Make sure you discuss any questions you have with your health care provider. Document Revised: 03/29/2020 Document Reviewed: 03/29/2020 Elsevier Patient Education  [place].

## 2020-12-27 ENCOUNTER — Encounter (HOSPITAL_COMMUNITY): Payer: Self-pay | Admitting: Cardiology

## 2020-12-30 DIAGNOSIS — J9611 Chronic respiratory failure with hypoxia: Secondary | ICD-10-CM | POA: Diagnosis not present

## 2020-12-30 DIAGNOSIS — I4891 Unspecified atrial fibrillation: Secondary | ICD-10-CM | POA: Diagnosis not present

## 2020-12-30 DIAGNOSIS — N1832 Chronic kidney disease, stage 3b: Secondary | ICD-10-CM | POA: Diagnosis not present

## 2020-12-30 DIAGNOSIS — J449 Chronic obstructive pulmonary disease, unspecified: Secondary | ICD-10-CM | POA: Diagnosis not present

## 2020-12-30 DIAGNOSIS — I130 Hypertensive heart and chronic kidney disease with heart failure and stage 1 through stage 4 chronic kidney disease, or unspecified chronic kidney disease: Secondary | ICD-10-CM | POA: Diagnosis not present

## 2020-12-30 DIAGNOSIS — I5032 Chronic diastolic (congestive) heart failure: Secondary | ICD-10-CM | POA: Diagnosis not present

## 2020-12-31 ENCOUNTER — Other Ambulatory Visit: Payer: Self-pay

## 2020-12-31 ENCOUNTER — Encounter (HOSPITAL_COMMUNITY): Payer: Self-pay

## 2020-12-31 ENCOUNTER — Ambulatory Visit (HOSPITAL_COMMUNITY)
Admission: RE | Admit: 2020-12-31 | Discharge: 2020-12-31 | Disposition: A | Discharge status: Home / Self Care | Payer: Medicare Other | Source: Ambulatory Visit | Attending: Cardiology | Admitting: Cardiology

## 2020-12-31 VITALS — BP 110/60 | HR 68 | Wt 197.0 lb

## 2020-12-31 DIAGNOSIS — E871 Hypo-osmolality and hyponatremia: Secondary | ICD-10-CM | POA: Insufficient documentation

## 2020-12-31 DIAGNOSIS — Z7982 Long term (current) use of aspirin: Secondary | ICD-10-CM | POA: Insufficient documentation

## 2020-12-31 DIAGNOSIS — I7100 Dissection of unspecified site of aorta: Secondary | ICD-10-CM | POA: Diagnosis not present

## 2020-12-31 DIAGNOSIS — J449 Chronic obstructive pulmonary disease, unspecified: Secondary | ICD-10-CM | POA: Insufficient documentation

## 2020-12-31 DIAGNOSIS — Z7984 Long term (current) use of oral hypoglycemic drugs: Secondary | ICD-10-CM | POA: Diagnosis not present

## 2020-12-31 DIAGNOSIS — Z953 Presence of xenogenic heart valve: Secondary | ICD-10-CM | POA: Insufficient documentation

## 2020-12-31 DIAGNOSIS — I480 Paroxysmal atrial fibrillation: Secondary | ICD-10-CM | POA: Insufficient documentation

## 2020-12-31 DIAGNOSIS — G4733 Obstructive sleep apnea (adult) (pediatric): Secondary | ICD-10-CM | POA: Insufficient documentation

## 2020-12-31 DIAGNOSIS — I110 Hypertensive heart disease with heart failure: Secondary | ICD-10-CM | POA: Diagnosis not present

## 2020-12-31 DIAGNOSIS — I10 Essential (primary) hypertension: Secondary | ICD-10-CM | POA: Diagnosis not present

## 2020-12-31 DIAGNOSIS — Z87891 Personal history of nicotine dependence: Secondary | ICD-10-CM | POA: Insufficient documentation

## 2020-12-31 DIAGNOSIS — Z7901 Long term (current) use of anticoagulants: Secondary | ICD-10-CM | POA: Diagnosis not present

## 2020-12-31 DIAGNOSIS — Z79899 Other long term (current) drug therapy: Secondary | ICD-10-CM | POA: Diagnosis not present

## 2020-12-31 DIAGNOSIS — I5032 Chronic diastolic (congestive) heart failure: Secondary | ICD-10-CM

## 2020-12-31 DIAGNOSIS — Z9989 Dependence on other enabling machines and devices: Secondary | ICD-10-CM | POA: Insufficient documentation

## 2020-12-31 LAB — BASIC METABOLIC PANEL
Anion gap: 7 (ref 5–15)
BUN: 24 mg/dL — ABNORMAL HIGH (ref 8–23)
CO2: 31 mmol/L (ref 22–32)
Calcium: 8.9 mg/dL (ref 8.9–10.3)
Chloride: 94 mmol/L — ABNORMAL LOW (ref 98–111)
Creatinine, Ser: 1.89 mg/dL — ABNORMAL HIGH (ref 0.61–1.24)
GFR, Estimated: 37 mL/min — ABNORMAL LOW (ref >60)
Glucose, Bld: 91 mg/dL (ref 70–99)
Potassium: 4.4 mmol/L (ref 3.5–5.1)
Sodium: 132 mmol/L — ABNORMAL LOW (ref 135–145)

## 2020-12-31 NOTE — Patient Instructions (Signed)
It was great to see you today! No medication changes are needed at this time.  Labs today We will only contact you if something comes back abnormal or we need to make some changes. Otherwise no news is good news!  Your physician recommends that you schedule a follow-up appointment in: 3 months with Dr McLean  Do the following things EVERYDAY: 1) Weigh yourself in the morning before breakfast. Write it down and keep it in a log. 2) Take your medicines as prescribed 3) Eat low salt foods--Limit salt (sodium) to 2000 mg per day.  4) Stay as active as you can everyday 5) Limit all fluids for the day to less than 2 liters At [facility], you and your health needs are our priority. As part of our continuing mission to provide you with exceptional heart care, we have created designated Provider Care Teams. These Care Teams include your primary Cardiologist (physician) and Advanced Practice Providers (APPs- Physician Assistants and Nurse Practitioners) who all work together to provide you with the care you need, when you need it.   You may see any of the following providers on your designated Care Team at your next follow up: . Dr Daniel Bensimhon . Dr Dalton McLean . Dr Brandon Winfrey . Amy Clegg, NP . Brittainy Simmons, PA . Jessica Milford,NP . Lauren Kemp, PharmD   Please be sure to bring in all your medications bottles to every appointment.   If you have any questions or concerns before your next appointment please send us a message through mychart or call our office at [phone].    TO LEAVE A MESSAGE FOR THE NURSE SELECT OPTION 2, PLEASE LEAVE A MESSAGE INCLUDING: . YOUR NAME . DATE OF BIRTH . CALL BACK NUMBER . REASON FOR CALL**this is important as we prioritize the call backs  YOU WILL RECEIVE A CALL BACK THE SAME DAY AS LONG AS YOU CALL BEFORE 4:00 PM  

## 2020-12-31 NOTE — Progress Notes (Signed)
PCP: Colon Branch, MD Cardiology: Dr. Caryl Comes HF Cardiology: Dr. Aundra Dubin  70 y.o. with history of COPD, paroxysmal atrial fibrillation, type A aortic dissection, and chronic diastolic CHF was referred by Dr. Caryl Comes for evaluation of CHF.  Prior to 2018, patient reports no significant limitations.  In 8/18, he had a Type A aortic dissection.  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  The dissection extended down to the abdominal aorta.  He was a heavy smoker, quit in 8/18.  He developed paroxysmal atrial fibrillation, had DCCV in 10/19.  He is on Eliquis and flecainide now.  Last echo in 2/20 showed EF 55%, mildly decreased RV systolic function, normally functioning bioprosthetic aortic valve.  PFTs in 6/19 showed moderate-severe obstruction.    Ever since his operation in 2018, Sean Riley has had significant exertional dyspnea and has been quite limited.    He had RHC in 11/20, showing normal filling pressures, preserved cardiac output, and mild primarily pulmonary venous hypertension.    PYP scan was not suggestive of cardiac amyloidosis.   He saw pulmonary, PFTs showed moderate to severe obstruction.  He is using oxygen with exertion now, which he says helps. He is using CPAP.   Echo in 5/21 showed EF 55%, basal inferior akinesis, mild RV dilation with normal function, PASP 46 mmHg, s/p Bentall, s/p bioprosthetic aortic valve with mean gradient 16 mmHg.   11/21 had DCCV to NSR.  Had been in atrial fibrillation for a few weeks.   He was admitted in 1/22 with CHF exacerbation and diuresed.  Echo in 1/22 showed EF 60-65%, moderate RV enlargement with mildly decreased RV systolic function, PASP 44, bioprosthetic AVR with mean gradient 18 mmHg.   Last seen by Dr. Aundra Dubin 4/22 and was fairly stable, remained NYHA Class III but not significantly fluid overloaded on exam. Jardiance was added to regimen and he was referred for CardioMEMs implant.   Device was implanted on 5/19.  He returns to clinic today for f/u. Doing well. No complaints. Stable symptoms. No CP. Using home O2, 2L/min baseline. Baseline dyspnea not any worse. Wt is stable. No gain. Remains in NSR on EKG. BP well controlled.  dPAP day of CardioMEMs implant was 23. dPAP today was 17. Still waiting for set goal.     RHC at time of CArdiomems Implant 12/26/20  RHC Procedural Findings: Hemodynamics (mmHg) RA mean 11 RV 57/12 PA 58/16, mean 34 PCWP mean 18  Oxygen saturations: PA 63% AO 93%  Cardiac Output (Fick) 5.42  Cardiac Index (Fick) 2.56 PVR 2.95 WU    ECG (personally reviewed): NSR w/ RBBB + LAFB (chronic) 62 bpm   Labs (8/20): LDL 74 Labs (10/20): Na 126, K 4.5, creatinine 1.15 Labs (11/20): K 3.8, creatinine 1.3 Labs (12/20): K 3.9, creatinine 1.5, myeloma panel negative Labs (3/21): K 4.1, creatinine 1.6, TSH normal Labs (5/21): BNP 142, creatinine 2 Labs (9/21): K 4.5, creatinine 1.8 Labs (10/21): K 4, creatinine 1.55, BNP 118 Labs (2/22): K 4.3, creatinine 1.85 Labs (4/22): K 4.2, creatinine 1.70 Labs (5/22): K 4.8, creatinine 1.95   PMH: 1. COPD: PFTs (6/19) with moderate-severe obstruction.  Prior heavy smoker, quit 2018.  - Uses oxygen with exertion.  2. Atrial fibrillation: Paroxysmal. DCCV in 10/19, now on flecainide.  - DCCV to NSR in 11/21.  3. HTN 4. Chronic diastolic CHF: Echo (2/94) with EF 55%, mild LVH, mild LV dilation, mild RV dilation with mildly decreased systolic function, bioprosthetic  aortic valve with mean gradient 15 mmHg.  - RHC (11/20): mean RA 9, PA 41/15, mean PCWP 13, CI 2.81, PVR 2.15 WU.   - PYP scan not suggestive of cardiac amyloidosis.  - Echo (5/21): EF 55%, basal inferior akinesis, mild RV dilation with normal function, PASP 46 mmHg, s/p Bentall, s/p bioprosthetic aortic valve with mean gradient 16 mmHg.  - Echo (1/22): EF 60-65%, moderate RV enlargement with mildly decreased RV systolic function, PASP 44,  bioprosthetic AVR with mean gradient 18 mmHg.  5. Type A aortic dissection: 8/18, patient had emergency repair with Bentall/bioprosthetic aortic valve.  Dissection extends down to abdominal aorta.  Coronaries re-implanted. Complicated by pericardial effusion requiring pericardiocentesis.  6. Chronic hyponatremia.  7. OSA: He is using CPAP.  8. ABIs (7/21): Normal  FH: Uncle with aortic dissection, mother with SCD at age 74.   SH: Lives on [place] farm in [place].  Married, prior heavy smoker but quit in 2018.   ROS: All systems reviewed and negative except as per HPI.   Current Outpatient Medications  Medication Sig Dispense Refill  . albuterol (PROAIR HFA) 108 (90 Base) MCG/ACT inhaler Inhale 2 puffs into the lungs every 4 (four) hours as needed for wheezing or shortness of breath. 54 g 3  . albuterol (PROVENTIL) (2.5 MG/3ML) 0.083% nebulizer solution Take 3 mLs (2.5 mg total) by nebulization every 8 (eight) hours as needed for wheezing or shortness of breath. 150 mL 1  . ALPRAZolam (XANAX) 1 MG tablet TAKE 1-1&1/2 TABLETS BY MOUTH AT BEDTIME (Patient taking differently: Take 1 mg by mouth at bedtime as needed for sleep.) 40 tablet 2  . aspirin (ASPIRIN CHILDRENS) 81 MG chewable tablet Chew 1 tablet (81 mg total) by mouth daily. 30 tablet 0  . augmented betamethasone dipropionate (DIPROLENE-AF) 0.05 % cream Apply topically 2 (two) times daily as needed. (Patient taking differently: Apply 1 application topically 2 (two) times daily as needed (back rash).) 50 g 1  . bisacodyl (DULCOLAX) 5 MG EC tablet Take 5 mg by mouth daily as needed for moderate constipation.    . diphenhydrAMINE (BENADRYL) 25 MG tablet Take 25 mg by mouth every 8 (eight) hours as needed (allergies/runny nose).     Marland Kitchen empagliflozin (JARDIANCE) 10 MG TABS tablet Take 1 tablet (10 mg total) by mouth daily before breakfast. 90 tablet 0  . escitalopram (LEXAPRO) 10 MG tablet TAKE 1 TABLET DAILY (Patient taking differently: Take 10  mg by mouth in the morning.) 90 tablet 3  . esomeprazole (NEXIUM) 40 MG capsule Take 1 capsule (40 mg total) by mouth daily before breakfast. 90 capsule 3  . flecainide (TAMBOCOR) 50 MG tablet TAKE ONE AND ONE-HALF TABLETS TWICE A DAY (Patient taking differently: Take 75 mg by mouth 2 (two) times daily.) 270 tablet 2  . fluticasone (FLONASE) 50 MCG/ACT nasal spray Place 1 spray into both nostrils 2 (two) times daily as needed for allergies. Use one spray in each nostril twice daily.    . hydroxypropyl methylcellulose / hypromellose (ISOPTO TEARS / GONIOVISC) 2.5 % ophthalmic solution Place 1-2 drops into both eyes 3 (three) times daily as needed (dry/irritated eyes).    Marland Kitchen ipratropium-albuterol (DUONEB) 0.5-2.5 (3) MG/3ML SOLN Take 3 mLs by nebulization 4 (four) times daily. 360 mL 5  . KLOR-CON M20 20 MEQ tablet TAKE 2 TABLETS (40 MEQ) DAILY (Patient taking differently: Take 40 mEq by mouth at bedtime.) 180 tablet 3  . losartan (COZAAR) 25 MG tablet Take 1 tablet (25 mg  total) by mouth daily. 90 tablet 1  . metoprolol tartrate (LOPRESSOR) 25 MG tablet Take 0.5 tablets (12.5 mg total) by mouth 2 (two) times daily. Please make yearly appt with Dr. Caryl Comes for January 2022 for future refills. Thank you 1st attempt 180 tablet 1  . OXYGEN Inhale 2 L/min into the lungs as needed (for shortness of breath).    . rosuvastatin (CRESTOR) 5 MG tablet Take 1 tablet (5 mg total) by mouth daily. 90 tablet 1  . torsemide (DEMADEX) 20 MG tablet Take 3 tablets (60 mg total) by mouth daily. 270 tablet 3  . apixaban (ELIQUIS) 5 MG TABS tablet Take 1 tablet (5 mg total) by mouth 2 (two) times daily for 1 day. 180 tablet 2  . tamoxifen (NOLVADEX) 10 MG tablet TAKE 1 TABLET TWICE A DAY (Patient not taking: No sig reported) 180 tablet 0   No current facility-administered medications for this encounter.   BP 110/60   Pulse 68   Wt 89.4 kg   SpO2 90% Comment: on 3L  BMI 26.72 kg/m  PHYSICAL EXAM: General:  Well  appearing, in wheel chair. No respiratory difficulty HEENT: normal Neck: supple. no JVD. Carotids 2+ bilat; no bruits. No lymphadenopathy or thyromegaly appreciated. Cor: PMI nondisplaced. Regular rate & rhythm. 3/6 murmur loudest at RUSB  Lungs: clear Abdomen: soft, nontender, nondistended. No hepatosplenomegaly. No bruits or masses. Good bowel sounds. Extremities: no cyanosis, clubbing, rash, edema Neuro: alert & oriented x 3, cranial nerves grossly intact. moves all 4 extremities w/o difficulty. Affect pleasant.   Assessment/Plan: 1. Atrial fibrillation: Paroxysmal.  S/p DCCV in 11/21.  Maintaining NSR on EKG today. HR controlled     - Continue Eliquis 5 mg bid.  - Continue flecainide + metoprolol.  2. Chronic diastolic CHF: Echo in 9/48 with EF 55%, mildly decreased RV systolic function, normally functioning bioprosthetic aortic valve. RHC in 11/20 after increasing torsemide showed near-normal filling pressures.  PYP study was not suggestive of ATTR amyloidosis and myeloma panel was negative.  Last echo in 1/22 showed EF 60-65%, moderate RV enlargement with mildly decreased RV systolic function, PASP 44, bioprosthetic AVR with mean gradient 18 mmHg.  I suspect that COPD also contributes to his exercise intolerance.  Atrial fibrillation worsens his HF, he is in NSR today. NYHA class III symptoms, stable and confounded by COPD. He is not significantly volume overloaded on exam today. CardioMEMs placed 12/26/20. Waiting dPAP goal determination, however today's reading is lower than pressure reading day of implant 22>>17.  - Continue torsemide 60 mg daily alternating with 40 mg daily.   - Continue Jardiance 10 mg daily. Tolerating ok w/o GU effects    3. COPD: Moderate to severe obstruction on 6/19 PFTs, prior smoker.  As above, I suspect this contributes to his dyspnea.  He is on home oxygen.  - Continue followup with pulmonary.  4. AXK:PVVZSMOL CPAP.    5. Aortic dissection: S/p Type A aortic  dissection, s/p Bentall with bioprosthetic aortic valve. Echo today showed stable bioprosthetic aortic valve.  Dr. Trula Slade has been following his residual dissected aorta.   - Given family history of aortic disease (uncle with aortic dissection, mother with SCD at age 61 - ?from dissection), his children's aortas need to be screened due to risk for inherited connective tissue disorder.  We have discussed this and he reports they have been screened.  He is not Marfanoid.    F/u in 2-3 months w/ Dr. Esmeralda Links ,  PA-C 12/31/2020

## 2021-01-01 DIAGNOSIS — I5032 Chronic diastolic (congestive) heart failure: Secondary | ICD-10-CM | POA: Diagnosis not present

## 2021-01-01 DIAGNOSIS — J9611 Chronic respiratory failure with hypoxia: Secondary | ICD-10-CM | POA: Diagnosis not present

## 2021-01-01 DIAGNOSIS — I4891 Unspecified atrial fibrillation: Secondary | ICD-10-CM | POA: Diagnosis not present

## 2021-01-01 DIAGNOSIS — N1832 Chronic kidney disease, stage 3b: Secondary | ICD-10-CM | POA: Diagnosis not present

## 2021-01-01 DIAGNOSIS — J449 Chronic obstructive pulmonary disease, unspecified: Secondary | ICD-10-CM | POA: Diagnosis not present

## 2021-01-01 DIAGNOSIS — I130 Hypertensive heart and chronic kidney disease with heart failure and stage 1 through stage 4 chronic kidney disease, or unspecified chronic kidney disease: Secondary | ICD-10-CM | POA: Diagnosis not present

## 2021-01-06 DIAGNOSIS — N1832 Chronic kidney disease, stage 3b: Secondary | ICD-10-CM | POA: Diagnosis not present

## 2021-01-06 DIAGNOSIS — I5032 Chronic diastolic (congestive) heart failure: Secondary | ICD-10-CM | POA: Diagnosis not present

## 2021-01-06 DIAGNOSIS — I130 Hypertensive heart and chronic kidney disease with heart failure and stage 1 through stage 4 chronic kidney disease, or unspecified chronic kidney disease: Secondary | ICD-10-CM | POA: Diagnosis not present

## 2021-01-06 DIAGNOSIS — J449 Chronic obstructive pulmonary disease, unspecified: Secondary | ICD-10-CM | POA: Diagnosis not present

## 2021-01-06 DIAGNOSIS — I4891 Unspecified atrial fibrillation: Secondary | ICD-10-CM | POA: Diagnosis not present

## 2021-01-06 DIAGNOSIS — J9611 Chronic respiratory failure with hypoxia: Secondary | ICD-10-CM | POA: Diagnosis not present

## 2021-01-13 ENCOUNTER — Telehealth (HOSPITAL_COMMUNITY): Payer: Self-pay | Admitting: Adult Health

## 2021-01-13 NOTE — Telephone Encounter (Signed)
Called to request Cardiomems Reading.   Sean Riley appreciated the call and will help set up the reading.  Rumor Sun NP-C  12:33 PM

## 2021-01-15 ENCOUNTER — Other Ambulatory Visit: Payer: Medicare Other

## 2021-01-15 ENCOUNTER — Ambulatory Visit
Admission: RE | Admit: 2021-01-15 | Discharge: 2021-01-15 | Disposition: A | Discharge status: Home / Self Care | Payer: Medicare Other | Source: Ambulatory Visit | Attending: Surgery | Admitting: Surgery

## 2021-01-15 DIAGNOSIS — I2510 Atherosclerotic heart disease of native coronary artery without angina pectoris: Secondary | ICD-10-CM | POA: Diagnosis not present

## 2021-01-15 DIAGNOSIS — N400 Enlarged prostate without lower urinary tract symptoms: Secondary | ICD-10-CM | POA: Diagnosis not present

## 2021-01-15 DIAGNOSIS — I7102 Dissection of abdominal aorta: Secondary | ICD-10-CM | POA: Diagnosis not present

## 2021-01-15 DIAGNOSIS — I712 Thoracic aortic aneurysm, without rupture, unspecified: Secondary | ICD-10-CM

## 2021-01-15 DIAGNOSIS — K8020 Calculus of gallbladder without cholecystitis without obstruction: Secondary | ICD-10-CM | POA: Diagnosis not present

## 2021-01-15 DIAGNOSIS — I7772 Dissection of iliac artery: Secondary | ICD-10-CM | POA: Diagnosis not present

## 2021-01-15 MED ORDER — IOPAMIDOL (ISOVUE-370) INJECTION 76%
60.0000 mL | Freq: Once | INTRAVENOUS | Status: AC | PRN
  Administered 2021-01-15: 60 mL via INTRAVENOUS

## 2021-01-20 ENCOUNTER — Other Ambulatory Visit: Payer: Self-pay

## 2021-01-20 ENCOUNTER — Encounter: Payer: Self-pay | Admitting: Surgery

## 2021-01-20 ENCOUNTER — Ambulatory Visit (INDEPENDENT_AMBULATORY_CARE_PROVIDER_SITE_OTHER): Payer: Medicare Other | Admitting: Surgery

## 2021-01-20 VITALS — BP 105/61 | HR 65 | Temp 98.3°F | Resp 20 | Ht 72.0 in | Wt 197.0 lb

## 2021-01-20 DIAGNOSIS — I712 Thoracic aortic aneurysm, without rupture, unspecified: Secondary | ICD-10-CM

## 2021-01-20 NOTE — Progress Notes (Signed)
[facility]  Patient name: Sean Riley MRN: [id number] DOB: 1951-05-09 Sex: male   REASON FOR VISIT:    Follow up  HISOTRY OF PRESENT ILLNESS:     Sean Riley is a 70 y.o. 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  He underwent complete aortic arch replacement with elephant trunk as well as bioprosthetic tissue valve.  He had progressive enlargement of the descending thoracic aorta with persistent dissection flap and underwent endovascular repair on 03/30/2018.  His postoperative course was uncomplicated.  He has no new complaints today.  He denies claudication.  He is not having neurologic symptoms or arm weakness.   He is medically managed for hypertension with an ARB.  He takes a statin for hypercholesterolemia.  He is on Eliquis for atrial fibrillation.  He is a former smoker with COPD on intermittent O2 and CPAP.  He is followed by Dr. Marigene Ehlers for CHF.   PAST MEDICAL HISTORY:   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[place]) 03/23/2017   Atrial fibrillation (HCC)    Cataract    removed both eyes    CHF (congestive heart failure) (HCC)    GERD (gastroesophageal reflux disease)    HYPERLIPIDEMIA    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 LUTS    Hypertension    Microscopic hematuria    negative cystoscopy   NONSPECIFIC ABNORMAL ELECTROCARDIOGRAM    Pleural effusion on right    Pleural effusion, bilateral    S/P aortic dissection repair 03/24/2017   Biological Bentall aortic root replacement + resection and grafting of entire ascending aorta, transverse aortic arch and proximal descending thoracic aorta with elephant trunk distal anastomosis and debranching of aortic arch vessels   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   25 mm Edwards Magna Ease bovine  pericardial tissue valve and 28 mm Gelweave Valsalva aortic root graft with reimplantation of left main and right coronary arteries   Varicose veins      FAMILY HISTORY:   Family History  Problem Relation Age of Onset   Hypertension Mother    Hypertension Father    Benign prostatic hyperplasia Father        S/P TURP   Heart attack Maternal Grandmother        MI in 76s   Breast cancer Maternal Grandmother    Arrhythmia Brother         X 2   Heart attack Maternal Aunt        MI in 37s   Stroke Neg Hx    Diabetes Neg Hx    Colon cancer Neg Hx    Colon polyps Neg Hx    Stomach cancer Neg Hx    Rectal cancer Neg Hx    Esophageal cancer Neg Hx    Prostate cancer Neg Hx    Other Neg Hx        gynecomastia    SOCIAL HISTORY:   Social History   Tobacco Use   Smoking status: Former    Packs/day: 2.00    Years: 45.00    Pack years: 90.00    Types: Cigarettes    Quit date: 03/22/2017    Years since quitting: 3.8   Smokeless tobacco: Never   Tobacco comments:    2 ppd , quit 03/2017  Substance Use Topics  Alcohol use: Not Currently    Alcohol/week: 0.0 standard drinks    Comment: 5 cans of beer daily     ALLERGIES:   Allergies  Allergen Reactions   Simvastatin Other (See Comments)    Mental status changes   Celecoxib Other (See Comments)    GI UPSET AND INFLAMMATION   Codeine Other (See Comments)    GI UPSET AND INFLAMMATION   Nsaids Other (See Comments)    GI UPSET AND INFLAMMATION (can tolerate via IV)   Tape Other (See Comments)    Medical tape and Band-Aids PULL OFF THE SKIN; please use Coban wrap   Cephalexin Itching and Rash   Latex Rash     CURRENT MEDICATIONS:   Current Outpatient Medications  Medication Sig Dispense Refill   albuterol (PROAIR HFA) 108 (90 Base) MCG/ACT inhaler Inhale 2 puffs into the lungs every 4 (four) hours as needed for wheezing or shortness of breath. 54 g 3   albuterol (PROVENTIL) (2.5 MG/3ML) 0.083% nebulizer solution  Take 3 mLs (2.5 mg total) by nebulization every 8 (eight) hours as needed for wheezing or shortness of breath. 150 mL 1   ALPRAZolam (XANAX) 1 MG tablet TAKE 1-1&1/2 TABLETS BY MOUTH AT BEDTIME (Patient taking differently: Take 1 mg by mouth at bedtime as needed for sleep.) 40 tablet 2   apixaban (ELIQUIS) 5 MG TABS tablet Take 1 tablet (5 mg total) by mouth 2 (two) times daily for 1 day. 180 tablet 2   aspirin (ASPIRIN CHILDRENS) 81 MG chewable tablet Chew 1 tablet (81 mg total) by mouth daily. 30 tablet 0   augmented betamethasone dipropionate (DIPROLENE-AF) 0.05 % cream Apply topically 2 (two) times daily as needed. (Patient taking differently: Apply 1 application topically 2 (two) times daily as needed (back rash).) 50 g 1   bisacodyl (DULCOLAX) 5 MG EC tablet Take 5 mg by mouth daily as needed for moderate constipation.     diphenhydrAMINE (BENADRYL) 25 MG tablet Take 25 mg by mouth every 8 (eight) hours as needed (allergies/runny nose).      empagliflozin (JARDIANCE) 10 MG TABS tablet Take 1 tablet (10 mg total) by mouth daily before breakfast. 90 tablet 0   escitalopram (LEXAPRO) 10 MG tablet TAKE 1 TABLET DAILY (Patient taking differently: Take 10 mg by mouth in the morning.) 90 tablet 3   esomeprazole (NEXIUM) 40 MG capsule Take 1 capsule (40 mg total) by mouth daily before breakfast. 90 capsule 3   flecainide (TAMBOCOR) 50 MG tablet TAKE ONE AND ONE-HALF TABLETS TWICE A DAY (Patient taking differently: Take 75 mg by mouth 2 (two) times daily.) 270 tablet 2   fluticasone (FLONASE) 50 MCG/ACT nasal spray Place 1 spray into both nostrils 2 (two) times daily as needed for allergies. Use one spray in each nostril twice daily.     hydroxypropyl methylcellulose / hypromellose (ISOPTO TEARS / GONIOVISC) 2.5 % ophthalmic solution Place 1-2 drops into both eyes 3 (three) times daily as needed (dry/irritated eyes).     ipratropium-albuterol (DUONEB) 0.5-2.5 (3) MG/3ML SOLN Take 3 mLs by nebulization 4  (four) times daily. 360 mL 5   KLOR-CON M20 20 MEQ tablet TAKE 2 TABLETS (40 MEQ) DAILY (Patient taking differently: Take 40 mEq by mouth at bedtime.) 180 tablet 3   losartan (COZAAR) 25 MG tablet Take 1 tablet (25 mg total) by mouth daily. 90 tablet 1   metoprolol tartrate (LOPRESSOR) 25 MG tablet Take 0.5 tablets (12.5 mg total) by mouth 2 (two) times daily.  Please make yearly appt with Dr. Caryl Comes for January 2022 for future refills. Thank you 1st attempt 180 tablet 1   OXYGEN Inhale 2 L/min into the lungs as needed (for shortness of breath).     rosuvastatin (CRESTOR) 5 MG tablet Take 1 tablet (5 mg total) by mouth daily. 90 tablet 1   tamoxifen (NOLVADEX) 10 MG tablet TAKE 1 TABLET TWICE A DAY (Patient not taking: No sig reported) 180 tablet 0   torsemide (DEMADEX) 20 MG tablet Take 3 tablets (60 mg total) by mouth daily. 270 tablet 3   No current facility-administered medications for this visit.    REVIEW OF SYSTEMS:   [X]  denotes positive finding, [ ]  denotes negative finding Cardiac  Comments:  Chest pain or chest pressure:    Shortness of breath upon exertion:    Short of breath when lying flat:    Irregular heart rhythm:        Vascular    Pain in calf, thigh, or hip brought on by ambulation:    Pain in feet at night that wakes you up from your sleep:     Blood clot in your veins:    Leg swelling:         Pulmonary    Oxygen at home: x   Productive cough:     Wheezing:         Neurologic    Sudden weakness in arms or legs:     Sudden numbness in arms or legs:     Sudden onset of difficulty speaking or slurred speech:    Temporary loss of vision in one eye:     Problems with dizziness:         Gastrointestinal    Blood in stool:     Vomited blood:         Genitourinary    Burning when urinating:     Blood in urine:        Psychiatric    Major depression:         Hematologic    Bleeding problems:    Problems with blood clotting too easily:        Skin     Rashes or ulcers:        Constitutional    Fever or chills:      PHYSICAL EXAM:   Vitals:   01/20/21 1037  BP: 105/61  Pulse: 65  Resp: 20  Temp: 98.3 F (36.8 C)  SpO2: 92%  Weight: 197 lb (89.4 kg)  Height: 6' (1.829 m)    GENERAL: The patient is a well-nourished male, in no acute distress. The vital signs are documented above. CARDIAC: There is a regular rate and rhythm.  PULMONARY: Non-labored respirations ABDOMEN: Soft and non-tender with normal pitched bowel sounds.  MUSCULOSKELETAL: There are no major deformities or cyanosis. NEUROLOGIC: No focal weakness or paresthesias are detected. SKIN: There are no ulcers or rashes noted. PSYCHIATRIC: The patient has a normal affect.  STUDIES:   I have reviewed the following CT scan:   VASCULAR   1. 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 No complicating features. 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 2. 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 The false lumen is patent throughout. 3. Similar appearing mild fusiform ectasia of the infrarenal abdominal aortic true lumen. Total aortic  diameter at this level measures 3.1 cm. 4. Interval insertion of CardioMEMS device in the distal left inferior lobar pulmonary artery. 5. Coronary and aortic atherosclerosis (ICD10-I70.0).   NON VASCULAR   1. Similar appearing small bilateral pleural effusions and scattered subpleural consolidative and cicatricial pulmonary opacities. Unchanged scattered prominent mediastinal lymph nodes, likely secondary to chronic inflammatory process. 2.  Emphysema (ICD10-J43.9). 3. Cholelithiasis. 4. Prostatomegaly. MEDICAL ISSUES:   Aortic dissection: CT scan shows widely patent arch reconstruction.  His stent graft is in good  position.  There has been no change in the aneurysmal component with maximum diameter of 5.1 cm.  Overall from my perspective he is doing very well.  He will return in 1 year for follow-up CT scan.    Leia Alf, MD, FACS [facility] [phone] Pager [phone]

## 2021-01-21 NOTE — Progress Notes (Deleted)
Subjective:   Sean Riley is a 70 y.o. male who presents for Medicare Annual/Subsequent preventive examination.  I connected with *** today by telephone and verified that I am speaking with the correct person using two identifiers. Location patient: home Location provider: work Persons participating in the virtual visit: patient, Marine scientist.    I discussed the limitations, risks, security and privacy concerns of performing an evaluation and management service by telephone and the availability of in person appointments. I also discussed with the patient that there may be a patient responsible charge related to this service. The patient expressed understanding and verbally consented to this telephonic visit.    Interactive audio and video telecommunications were attempted between this provider and patient, however failed, due to patient having technical difficulties OR patient did not have access to video capability.  We continued and completed visit with audio only.  Some vital signs may be absent or patient reported.   Time Spent with patient on telephone encounter: *** minutes   Review of Systems          Objective:    There were no vitals filed for this visit. There is no height or weight on file to calculate BMI.  Advanced Directives 12/26/2020 09/06/2020 06/13/2020 11/24/2019 06/15/2019 05/31/2018 04/25/2018  Does Patient Have a Medical Advance Directive? No No No No No No No  Would patient like information on creating a medical advance directive? Yes (ED - Information included in AVS) No - Patient declined No - Patient declined No - Patient declined No - Patient declined No - Patient declined -  Pre-existing out of facility DNR order (yellow form or pink MOST form) - - - - - - -    Current Medications (verified) Outpatient Encounter Medications as of 01/22/2021  Medication Sig   albuterol (PROAIR HFA) 108 (90 Base) MCG/ACT inhaler Inhale 2 puffs into the lungs every 4 (four)  hours as needed for wheezing or shortness of breath.   albuterol (PROVENTIL) (2.5 MG/3ML) 0.083% nebulizer solution Take 3 mLs (2.5 mg total) by nebulization every 8 (eight) hours as needed for wheezing or shortness of breath.   ALPRAZolam (XANAX) 1 MG tablet TAKE 1-1&1/2 TABLETS BY MOUTH AT BEDTIME (Patient taking differently: Take 1 mg by mouth at bedtime as needed for sleep.)   apixaban (ELIQUIS) 5 MG TABS tablet Take 1 tablet (5 mg total) by mouth 2 (two) times daily for 1 day.   aspirin (ASPIRIN CHILDRENS) 81 MG chewable tablet Chew 1 tablet (81 mg total) by mouth daily.   augmented betamethasone dipropionate (DIPROLENE-AF) 0.05 % cream Apply topically 2 (two) times daily as needed. (Patient taking differently: Apply 1 application topically 2 (two) times daily as needed (back rash).)   bisacodyl (DULCOLAX) 5 MG EC tablet Take 5 mg by mouth daily as needed for moderate constipation.   diphenhydrAMINE (BENADRYL) 25 MG tablet Take 25 mg by mouth every 8 (eight) hours as needed (allergies/runny nose).    empagliflozin (JARDIANCE) 10 MG TABS tablet Take 1 tablet (10 mg total) by mouth daily before breakfast.   escitalopram (LEXAPRO) 10 MG tablet TAKE 1 TABLET DAILY (Patient taking differently: Take 10 mg by mouth in the morning.)   esomeprazole (NEXIUM) 40 MG capsule Take 1 capsule (40 mg total) by mouth daily before breakfast.   flecainide (TAMBOCOR) 50 MG tablet TAKE ONE AND ONE-HALF TABLETS TWICE A DAY (Patient taking differently: Take 75 mg by mouth 2 (two) times daily.)   fluticasone (FLONASE) 50 MCG/ACT  nasal spray Place 1 spray into both nostrils 2 (two) times daily as needed for allergies. Use one spray in each nostril twice daily.   hydroxypropyl methylcellulose / hypromellose (ISOPTO TEARS / GONIOVISC) 2.5 % ophthalmic solution Place 1-2 drops into both eyes 3 (three) times daily as needed (dry/irritated eyes).   ipratropium-albuterol (DUONEB) 0.5-2.5 (3) MG/3ML SOLN Take 3 mLs by  nebulization 4 (four) times daily.   KLOR-CON M20 20 MEQ tablet TAKE 2 TABLETS (40 MEQ) DAILY (Patient taking differently: Take 40 mEq by mouth at bedtime.)   losartan (COZAAR) 25 MG tablet Take 1 tablet (25 mg total) by mouth daily.   metoprolol tartrate (LOPRESSOR) 25 MG tablet Take 0.5 tablets (12.5 mg total) by mouth 2 (two) times daily. Please make yearly appt with Dr. Caryl Comes for January 2022 for future refills. Thank you 1st attempt   OXYGEN Inhale 2 L/min into the lungs as needed (for shortness of breath).   rosuvastatin (CRESTOR) 5 MG tablet Take 1 tablet (5 mg total) by mouth daily.   tamoxifen (NOLVADEX) 10 MG tablet TAKE 1 TABLET TWICE A DAY (Patient not taking: No sig reported)   torsemide (DEMADEX) 20 MG tablet Take 3 tablets (60 mg total) by mouth daily.   No facility-administered encounter medications on file as of 01/22/2021.    Allergies (verified) Simvastatin, Celecoxib, Codeine, Nsaids, Tape, Cephalexin, and Latex   History: 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[place]) 03/23/2017   Atrial fibrillation (HCC)    Cataract    removed both eyes    CHF (congestive heart failure) (HCC)    GERD (gastroesophageal reflux disease)    HYPERLIPIDEMIA    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 LUTS    Hypertension    Microscopic hematuria    negative cystoscopy   NONSPECIFIC ABNORMAL ELECTROCARDIOGRAM    Pleural effusion on right    Pleural effusion, bilateral    S/P aortic dissection repair 03/24/2017   Biological Bentall aortic root replacement + resection and grafting of entire ascending aorta, transverse aortic arch and proximal descending thoracic aorta with elephant trunk distal anastomosis and debranching of aortic arch vessels   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   25 mm [facility] Ease bovine pericardial tissue valve and 28 mm Gelweave Valsalva aortic root graft with  reimplantation of left main and right coronary arteries   Varicose veins    Past Surgical History:  Procedure Laterality Date   APPENDECTOMY     CARDIAC CATHETERIZATION      X3;Dr Caryl Comes, last 01/20/12: mild non-obstructive CAD, normal LV systolic function   CARDIOVERSION N/A 05/31/2018   Procedure: CARDIOVERSION;  Surgeon: Sueanne Margarita, MD;  Location[facility] ENDOSCOPY;  Service: Cardiovascular;  Laterality: N/A;   CARDIOVERSION N/A 06/13/2020   Procedure: CARDIOVERSION;  Surgeon: Larey Dresser, MD;  Location: [place];  Service: Cardiovascular;  Laterality: N/A;   COLONOSCOPY     CYSTOSCOPY  1978   Dr Hartley Barefoot   EYE SURGERY     IR THORACENTESIS ASP PLEURAL SPACE W/IMG GUIDE  11/25/2017   KNEE ARTHROSCOPY  2012   Dr Theda Sers   LUMBAR LAMINECTOMY  1987   Dr Lestine Box  2007   PERICARDIOCENTESIS N/A 04/07/2017   Procedure: PERICARDIOCENTESIS;  Surgeon: Sherren Mocha, MD;  Location: [place] CV LAB;  Service: Cardiovascular;  Laterality: N/A;   POLYPECTOMY     PRESSURE SENSOR/CARDIOMEMS  N/A 12/26/2020   Procedure: PRESSURE SENSOR/CARDIOMEMS;  Surgeon: Larey Dresser, MD;  Location: [place] CV LAB;  Service: Cardiovascular;  Laterality: N/A;   REPAIR OF ACUTE ASCENDING THORACIC AORTIC DISSECTION N/A 03/23/2017   Procedure: REPAIR OF ACUTE ASCENDING THORACIC AORTIC DISSECTION.  Bentall procedure.  Aortic root repleacement with bioprosthetic valve.  Reimplantation of left and right coronary arteries.  Total resection of transverse aortic arch.  Elephant trunk distal anastomosis and debranching of arch vessels.;  Surgeon: Rexene Alberts, MD;  Location: [place];  Service: Vascular;  Laterality: N/A;   RIGHT HEART CATH N/A 06/15/2019   Procedure: RIGHT HEART CATH;  Surgeon: Larey Dresser, MD;  Location: [place] CV LAB;  Service: Cardiovascular;  Laterality: N/A;   ROTATOR CUFF REPAIR     SHOULDER ARTHROSCOPY  2011   Dr Theda Sers   SHOULDER ARTHROSCOPY Right 08/2015    SPINAL FUSION  1986   Dr Rolin Barry   TEE WITHOUT CARDIOVERSION N/A 03/23/2017   Procedure: TRANSESOPHAGEAL ECHOCARDIOGRAM (TEE);  Surgeon: Rexene Alberts, MD;  Location: [place];  Service: Open Heart Surgery;  Laterality: N/A;   THORACIC AORTIC ENDOVASCULAR STENT GRAFT N/A 03/30/2018   Procedure: THORACIC AORTIC ENDOVASCULAR STENT GRAFT WITH INTRAVASCULAR ULTRASOUND;  Surgeon: Serafina Mitchell, MD;  Location: [facility] OR;  Service: Vascular;  Laterality: N/A;   TRACHEOSTOMY     age 38 for croup   Family History  Problem Relation Age of Onset   Hypertension Mother    Hypertension Father    Benign prostatic hyperplasia Father        S/P TURP   Heart attack Maternal Grandmother        MI in 78s   Breast cancer Maternal Grandmother    Arrhythmia Brother         X 2   Heart attack Maternal Aunt        MI in 39s   Stroke Neg Hx    Diabetes Neg Hx    Colon cancer Neg Hx    Colon polyps Neg Hx    Stomach cancer Neg Hx    Rectal cancer Neg Hx    Esophageal cancer Neg Hx    Prostate cancer Neg Hx    Other Neg Hx        gynecomastia   Social History   Socioeconomic History   Marital status: Married    Spouse name: Not on file   Number of children: 2   Years of education: Not on file   Highest education level: Not on file  Occupational History   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   Tobacco Use   Smoking status: Former    Packs/day: 2.00    Years: 45.00    Pack years: 90.00    Types: Cigarettes    Quit date: 03/22/2017    Years since quitting: 3.8   Smokeless tobacco: Never   Tobacco comments:    2 ppd , quit 03/2017  Vaping Use   Vaping Use: Never used  Substance and Sexual Activity   Alcohol use: Not Currently    Alcohol/week: 0.0 standard drinks    Comment: 5 cans of beer daily   Drug use: No   Sexual activity: Never  Other Topics Concern   Not on file  Social History Narrative   Lives w/ wife   Social Determinants of Health   Financial Resource Strain: Not on file   Food Insecurity: Not on file  Transportation Needs: Not on file  Physical Activity: Not on file  Stress: Not on file  Social Connections: Not on file    Tobacco Counseling Counseling given: Not Answered Tobacco comments: 2 ppd , quit 03/2017   Clinical Intake:                 Diabetic?No         Activities of Daily Living In your present state of health, do you have any difficulty performing the following activities: 09/06/2020 09/06/2020  Hearing? N Y  Vision? Y N  Difficulty concentrating or making decisions? Y N  Walking or climbing stairs? Y Y  Dressing or bathing? N N  Doing errands, shopping? N -  Some recent data might be hidden    Patient Care Team: Colon Branch, MD as PCP - General (Internal Medicine) Sueanne Margarita, MD as PCP - Sleep Medicine (Cardiology) Deboraha Sprang, MD as Consulting Physician (Cardiology) Darleen Crocker, MD as Consulting Physician (Ophthalmology) Loletha Carrow Kirke Corin, MD as Consulting Physician (Gastroenterology) Neldon Mc Donnamarie Poag, MD as Consulting Physician (Allergy and Immunology) Rexene Alberts, MD as Consulting Physician (Cardiothoracic Surgery) Larey Dresser, MD as Consulting Physician (Cardiology)  Indicate any recent Medical Services you may have received from other than [facility] providers in the past year (date may be approximate).     Assessment:   This is a routine wellness examination for [place].  Hearing/Vision screen No results found.  Dietary issues and exercise activities discussed:     Goals Addressed   None    Depression Screen PHQ 2/9 Scores 11/24/2019 09/22/2018 09/14/2017 06/21/2017 05/18/2017 03/12/2016 03/11/2015  PHQ - 2 Score 0 0 0 0 - 0 0  PHQ- 9 Score - 0 - - - - -  Exception Documentation - - - - Medical reason - -    Fall Risk Fall Risk  09/13/2020 11/24/2019 03/30/2019 09/22/2018 09/14/2017  Falls in the past year? 1 0 1 1 Yes  Comment - - - - -  Number falls in past yr: 1 0 1 1 1   Injury with Fall?  0 0 0 0 No  Risk for fall due to : - - - - Impaired balance/gait  Follow up - Education provided;Falls prevention discussed - - Education provided;Falls prevention discussed    FALL RISK PREVENTION PERTAINING TO THE HOME:  Any stairs in or around the home? {YES/NO:21197} If so, are there any without handrails? {YES/NO:21197} Home free of loose throw rugs in walkways, pet beds, electrical cords, etc? {YES/NO:21197} Adequate lighting in your home to reduce risk of falls? {YES/NO:21197}  ASSISTIVE DEVICES UTILIZED TO PREVENT FALLS:  Life alert? {YES/NO:21197} Use of a cane, walker or w/c? {YES/NO:21197} Grab bars in the bathroom? {YES/NO:21197} Shower chair or bench in shower? {YES/NO:21197} Elevated toilet seat or a handicapped toilet? {YES/NO:21197}  TIMED UP AND GO:  Was the test performed? {YES/NO:21197}.  Length of time to ambulate 10 feet:  sec.   {Appearance of Gait:2101803}  Cognitive Function: MMSE - Mini Mental State Exam 09/14/2017  Orientation to time 5  Orientation to Place 5  Registration 3  Attention/ Calculation 5  Recall 3  Language- name 2 objects 2  Language- repeat 1  Language- follow 3 step command 3  Language- read & follow direction 1  Write a sentence 1  Copy design 1  Total score 30        Immunizations Immunization History  Administered Date(s) Administered   Fluad Quad(high Dose 65+) 04/27/2019, 05/20/2020   Influenza, High  Dose Seasonal PF 06/05/2015, 05/18/2017, 05/18/2018   Influenza,inj,Quad PF,6+ Mos 04/24/2013, 05/10/2014   Influenza-Unspecified 06/10/2016   Moderna Sars-Covid-2 Vaccination 09/15/2019, 10/13/2019, 06/27/2020   Pneumococcal Conjugate-13 05/10/2014   Pneumococcal Polysaccharide-23 10/16/2013   Td 07/09/2006, 02/19/2008   Zoster, Live 08/10/2014    TDAP status: Due, Education has been provided regarding the importance of this vaccine. Advised may receive this vaccine at local pharmacy or Health Dept. Aware to provide  a copy of the vaccination record if obtained from local pharmacy or Health Dept. Verbalized acceptance and understanding.  Flu Vaccine status: Up to date  Pneumococcal vaccine status: Up to date  Covid-19 vaccine status: Completed vaccines  Qualifies for Shingles Vaccine? Yes   Zostavax completed Yes   Shingrix Completed?: No.    Education has been provided regarding the importance of this vaccine. Patient has been advised to call insurance company to determine out of pocket expense if they have not yet received this vaccine. Advised may also receive vaccine at local pharmacy or Health Dept. Verbalized acceptance and understanding.  Screening Tests Health Maintenance  Topic Date Due   Zoster Vaccines- Shingrix (1 of 2) Never done   TETANUS/TDAP  02/18/2018   COVID-19 Vaccine (4 - Booster for Moderna series) 09/27/2020   PNA vac Low Risk Adult (2 of 2 - PPSV23) 09/13/2023 (Originally 10/17/2018)   INFLUENZA VACCINE  03/10/2021   Hepatitis C Screening  Completed   HPV VACCINES  Aged Out    Health Maintenance  Health Maintenance Due  Topic Date Due   Zoster Vaccines- Shingrix (1 of 2) Never done   TETANUS/TDAP  02/18/2018   COVID-19 Vaccine (4 - Booster for Moderna series) 09/27/2020    {Colorectal cancer screening:2101809}  Lung Cancer Screening: (Low Dose CT Chest recommended if Age 2-80 years, 30 pack-year currently smoking OR have quit w/in 15years.) does qualify.   Lung Cancer Screening Referral: Completed-11/13/2020  Additional Screening:  Hepatitis C Screening: Completed 03/12/2016  Vision Screening: Recommended annual ophthalmology exams for early detection of glaucoma and other disorders of the eye. Is the patient up to date with their annual eye exam?  {YES/NO:21197} Who is the provider or what is the name of the office in which the patient attends annual eye exams? *** If pt is not established with a provider, would they like to be referred to a provider to establish  care? {YES/NO:21197}.   Dental Screening: Recommended annual dental exams for proper oral hygiene  Community Resource Referral / Chronic Care Management: CRR required this visit?  {YES/NO:21197}  CCM required this visit?  {YES/NO:21197}     Plan:     I have personally reviewed and noted the following in the patient's chart:   Medical and social history Use of alcohol, tobacco or illicit drugs  Current medications and supplements including opioid prescriptions. {Opioid Prescriptions:8305955628} Functional ability and status Nutritional status Physical activity Advanced directives List of other physicians Hospitalizations, surgeries, and ER visits in previous 12 months Vitals Screenings to include cognitive, depression, and falls Referrals and appointments  In addition, I have reviewed and discussed with patient certain preventive protocols, quality metrics, and best practice recommendations. A written personalized care plan for preventive services as well as general preventive health recommendations were provided to patient.   Due to this being a telephonic visit, the after visit summary with patients personalized plan was offered to patient via mail or my-chart. ***Patient declined at this time./ Patient would like to access on my-chart/ per request, patient was mailed  a copy of AVS./ Patient preferred to pick up at office at next visit.    Marta Antu, LPN   [id number]  Nurse Health Advisor  Nurse Notes: ***

## 2021-01-22 ENCOUNTER — Ambulatory Visit: Payer: Medicare Other

## 2021-01-24 ENCOUNTER — Encounter (HOSPITAL_COMMUNITY): Payer: Self-pay

## 2021-01-24 ENCOUNTER — Emergency Department (HOSPITAL_COMMUNITY): Payer: Medicare Other

## 2021-01-24 ENCOUNTER — Inpatient Hospital Stay (HOSPITAL_COMMUNITY)
Admission: EM | Admit: 2021-01-24 | Discharge: 2021-01-31 | DRG: 377 | Disposition: A | Discharge status: Home Health | Payer: Medicare Other | Attending: Internal Medicine | Admitting: Internal Medicine

## 2021-01-24 ENCOUNTER — Telehealth (HOSPITAL_COMMUNITY): Payer: Self-pay | Admitting: *Deleted

## 2021-01-24 ENCOUNTER — Other Ambulatory Visit: Payer: Self-pay

## 2021-01-24 DIAGNOSIS — E785 Hyperlipidemia, unspecified: Secondary | ICD-10-CM | POA: Diagnosis present

## 2021-01-24 DIAGNOSIS — E861 Hypovolemia: Secondary | ICD-10-CM | POA: Diagnosis present

## 2021-01-24 DIAGNOSIS — I4510 Unspecified right bundle-branch block: Secondary | ICD-10-CM

## 2021-01-24 DIAGNOSIS — J9621 Acute and chronic respiratory failure with hypoxia: Secondary | ICD-10-CM | POA: Diagnosis present

## 2021-01-24 DIAGNOSIS — Z7984 Long term (current) use of oral hypoglycemic drugs: Secondary | ICD-10-CM

## 2021-01-24 DIAGNOSIS — R079 Chest pain, unspecified: Secondary | ICD-10-CM | POA: Diagnosis not present

## 2021-01-24 DIAGNOSIS — N1832 Chronic kidney disease, stage 3b: Secondary | ICD-10-CM | POA: Diagnosis present

## 2021-01-24 DIAGNOSIS — I4819 Other persistent atrial fibrillation: Secondary | ICD-10-CM | POA: Diagnosis present

## 2021-01-24 DIAGNOSIS — K921 Melena: Principal | ICD-10-CM

## 2021-01-24 DIAGNOSIS — J90 Pleural effusion, not elsewhere classified: Secondary | ICD-10-CM | POA: Diagnosis not present

## 2021-01-24 DIAGNOSIS — R195 Other fecal abnormalities: Secondary | ICD-10-CM

## 2021-01-24 DIAGNOSIS — J449 Chronic obstructive pulmonary disease, unspecified: Secondary | ICD-10-CM | POA: Diagnosis present

## 2021-01-24 DIAGNOSIS — I452 Bifascicular block: Secondary | ICD-10-CM | POA: Diagnosis present

## 2021-01-24 DIAGNOSIS — M1990 Unspecified osteoarthritis, unspecified site: Secondary | ICD-10-CM | POA: Diagnosis present

## 2021-01-24 DIAGNOSIS — B9620 Unspecified Escherichia coli [E. coli] as the cause of diseases classified elsewhere: Secondary | ICD-10-CM | POA: Diagnosis not present

## 2021-01-24 DIAGNOSIS — Z87891 Personal history of nicotine dependence: Secondary | ICD-10-CM

## 2021-01-24 DIAGNOSIS — Z7901 Long term (current) use of anticoagulants: Secondary | ICD-10-CM

## 2021-01-24 DIAGNOSIS — Z885 Allergy status to narcotic agent status: Secondary | ICD-10-CM

## 2021-01-24 DIAGNOSIS — Z79899 Other long term (current) drug therapy: Secondary | ICD-10-CM

## 2021-01-24 DIAGNOSIS — K8020 Calculus of gallbladder without cholecystitis without obstruction: Secondary | ICD-10-CM | POA: Diagnosis not present

## 2021-01-24 DIAGNOSIS — Z20822 Contact with and (suspected) exposure to covid-19: Secondary | ICD-10-CM | POA: Diagnosis present

## 2021-01-24 DIAGNOSIS — D500 Iron deficiency anemia secondary to blood loss (chronic): Secondary | ICD-10-CM | POA: Diagnosis not present

## 2021-01-24 DIAGNOSIS — Z1612 Extended spectrum beta lactamase (ESBL) resistance: Secondary | ICD-10-CM | POA: Diagnosis not present

## 2021-01-24 DIAGNOSIS — R578 Other shock: Secondary | ICD-10-CM | POA: Diagnosis present

## 2021-01-24 DIAGNOSIS — I480 Paroxysmal atrial fibrillation: Secondary | ICD-10-CM | POA: Diagnosis not present

## 2021-01-24 DIAGNOSIS — D72829 Elevated white blood cell count, unspecified: Secondary | ICD-10-CM

## 2021-01-24 DIAGNOSIS — Z9889 Other specified postprocedural states: Secondary | ICD-10-CM

## 2021-01-24 DIAGNOSIS — K922 Gastrointestinal hemorrhage, unspecified: Secondary | ICD-10-CM | POA: Diagnosis not present

## 2021-01-24 DIAGNOSIS — F17200 Nicotine dependence, unspecified, uncomplicated: Secondary | ICD-10-CM | POA: Diagnosis present

## 2021-01-24 DIAGNOSIS — I25110 Atherosclerotic heart disease of native coronary artery with unstable angina pectoris: Secondary | ICD-10-CM | POA: Diagnosis present

## 2021-01-24 DIAGNOSIS — Z8249 Family history of ischemic heart disease and other diseases of the circulatory system: Secondary | ICD-10-CM | POA: Diagnosis not present

## 2021-01-24 DIAGNOSIS — N179 Acute kidney failure, unspecified: Secondary | ICD-10-CM | POA: Diagnosis present

## 2021-01-24 DIAGNOSIS — I130 Hypertensive heart and chronic kidney disease with heart failure and stage 1 through stage 4 chronic kidney disease, or unspecified chronic kidney disease: Secondary | ICD-10-CM | POA: Diagnosis present

## 2021-01-24 DIAGNOSIS — J811 Chronic pulmonary edema: Secondary | ICD-10-CM | POA: Diagnosis not present

## 2021-01-24 DIAGNOSIS — R0902 Hypoxemia: Secondary | ICD-10-CM | POA: Diagnosis not present

## 2021-01-24 DIAGNOSIS — Z743 Need for continuous supervision: Secondary | ICD-10-CM | POA: Diagnosis not present

## 2021-01-24 DIAGNOSIS — I10 Essential (primary) hypertension: Secondary | ICD-10-CM

## 2021-01-24 DIAGNOSIS — N390 Urinary tract infection, site not specified: Secondary | ICD-10-CM | POA: Diagnosis not present

## 2021-01-24 DIAGNOSIS — Z886 Allergy status to analgesic agent status: Secondary | ICD-10-CM

## 2021-01-24 DIAGNOSIS — R918 Other nonspecific abnormal finding of lung field: Secondary | ICD-10-CM | POA: Diagnosis not present

## 2021-01-24 DIAGNOSIS — D649 Anemia, unspecified: Secondary | ICD-10-CM | POA: Diagnosis present

## 2021-01-24 DIAGNOSIS — D123 Benign neoplasm of transverse colon: Secondary | ICD-10-CM | POA: Diagnosis not present

## 2021-01-24 DIAGNOSIS — J9601 Acute respiratory failure with hypoxia: Secondary | ICD-10-CM

## 2021-01-24 DIAGNOSIS — Z9981 Dependence on supplemental oxygen: Secondary | ICD-10-CM | POA: Diagnosis not present

## 2021-01-24 DIAGNOSIS — R6889 Other general symptoms and signs: Secondary | ICD-10-CM | POA: Diagnosis not present

## 2021-01-24 DIAGNOSIS — J431 Panlobular emphysema: Secondary | ICD-10-CM

## 2021-01-24 DIAGNOSIS — K648 Other hemorrhoids: Secondary | ICD-10-CM | POA: Diagnosis present

## 2021-01-24 DIAGNOSIS — Z91040 Latex allergy status: Secondary | ICD-10-CM

## 2021-01-24 DIAGNOSIS — K5731 Diverticulosis of large intestine without perforation or abscess with bleeding: Secondary | ICD-10-CM | POA: Diagnosis not present

## 2021-01-24 DIAGNOSIS — I5033 Acute on chronic diastolic (congestive) heart failure: Secondary | ICD-10-CM | POA: Diagnosis present

## 2021-01-24 DIAGNOSIS — I700 Atherosclerosis of aorta: Secondary | ICD-10-CM | POA: Diagnosis not present

## 2021-01-24 DIAGNOSIS — F32A Depression, unspecified: Secondary | ICD-10-CM | POA: Diagnosis present

## 2021-01-24 DIAGNOSIS — E7800 Pure hypercholesterolemia, unspecified: Secondary | ICD-10-CM | POA: Diagnosis present

## 2021-01-24 DIAGNOSIS — R0600 Dyspnea, unspecified: Secondary | ICD-10-CM | POA: Diagnosis not present

## 2021-01-24 DIAGNOSIS — I471 Supraventricular tachycardia: Secondary | ICD-10-CM | POA: Diagnosis not present

## 2021-01-24 DIAGNOSIS — I5032 Chronic diastolic (congestive) heart failure: Secondary | ICD-10-CM | POA: Diagnosis present

## 2021-01-24 DIAGNOSIS — K635 Polyp of colon: Secondary | ICD-10-CM | POA: Diagnosis not present

## 2021-01-24 DIAGNOSIS — K5730 Diverticulosis of large intestine without perforation or abscess without bleeding: Secondary | ICD-10-CM | POA: Diagnosis present

## 2021-01-24 DIAGNOSIS — G4733 Obstructive sleep apnea (adult) (pediatric): Secondary | ICD-10-CM | POA: Diagnosis present

## 2021-01-24 DIAGNOSIS — I4719 Other supraventricular tachycardia: Secondary | ICD-10-CM | POA: Diagnosis present

## 2021-01-24 DIAGNOSIS — R059 Cough, unspecified: Secondary | ICD-10-CM | POA: Diagnosis not present

## 2021-01-24 DIAGNOSIS — Z803 Family history of malignant neoplasm of breast: Secondary | ICD-10-CM

## 2021-01-24 DIAGNOSIS — I313 Pericardial effusion (noninflammatory): Secondary | ICD-10-CM | POA: Diagnosis not present

## 2021-01-24 DIAGNOSIS — D62 Acute posthemorrhagic anemia: Secondary | ICD-10-CM | POA: Diagnosis present

## 2021-01-24 DIAGNOSIS — K219 Gastro-esophageal reflux disease without esophagitis: Secondary | ICD-10-CM | POA: Diagnosis present

## 2021-01-24 DIAGNOSIS — I4891 Unspecified atrial fibrillation: Secondary | ICD-10-CM | POA: Diagnosis present

## 2021-01-24 DIAGNOSIS — B9629 Other Escherichia coli [E. coli] as the cause of diseases classified elsewhere: Secondary | ICD-10-CM | POA: Diagnosis present

## 2021-01-24 DIAGNOSIS — R0603 Acute respiratory distress: Secondary | ICD-10-CM

## 2021-01-24 DIAGNOSIS — N400 Enlarged prostate without lower urinary tract symptoms: Secondary | ICD-10-CM | POA: Diagnosis present

## 2021-01-24 DIAGNOSIS — Z888 Allergy status to other drugs, medicaments and biological substances status: Secondary | ICD-10-CM

## 2021-01-24 DIAGNOSIS — R0789 Other chest pain: Secondary | ICD-10-CM | POA: Diagnosis not present

## 2021-01-24 DIAGNOSIS — Z881 Allergy status to other antibiotic agents status: Secondary | ICD-10-CM

## 2021-01-24 DIAGNOSIS — F419 Anxiety disorder, unspecified: Secondary | ICD-10-CM | POA: Diagnosis present

## 2021-01-24 DIAGNOSIS — R0602 Shortness of breath: Secondary | ICD-10-CM

## 2021-01-24 DIAGNOSIS — N183 Chronic kidney disease, stage 3 unspecified: Secondary | ICD-10-CM | POA: Diagnosis not present

## 2021-01-24 DIAGNOSIS — J9622 Acute and chronic respiratory failure with hypercapnia: Secondary | ICD-10-CM | POA: Insufficient documentation

## 2021-01-24 DIAGNOSIS — N189 Chronic kidney disease, unspecified: Secondary | ICD-10-CM

## 2021-01-24 DIAGNOSIS — I517 Cardiomegaly: Secondary | ICD-10-CM | POA: Diagnosis not present

## 2021-01-24 LAB — URINALYSIS, ROUTINE W REFLEX MICROSCOPIC
Bacteria, UA: NONE SEEN
Bilirubin Urine: NEGATIVE
Glucose, UA: >=500 mg/dL — AB
Hgb urine dipstick: NEGATIVE
Ketones, ur: NEGATIVE mg/dL
Leukocytes,Ua: NEGATIVE
Nitrite: NEGATIVE
Protein, ur: NEGATIVE mg/dL
Specific Gravity, Urine: 1.007 (ref 1.005–1.030)
pH: 5 (ref 5.0–8.0)

## 2021-01-24 LAB — CBC WITH DIFFERENTIAL/PLATELET
Abs Immature Granulocytes: 0.19 10*3/uL — ABNORMAL HIGH (ref 0.00–0.07)
Basophils Absolute: 0 10*3/uL (ref 0.0–0.1)
Basophils Relative: 0 %
Eosinophils Absolute: 0.2 10*3/uL (ref 0.0–0.5)
Eosinophils Relative: 2 %
HCT: 16.2 % — ABNORMAL LOW (ref 39.0–52.0)
Hemoglobin: 5 g/dL — CL (ref 13.0–17.0)
Immature Granulocytes: 2 %
Lymphocytes Relative: 5 %
Lymphs Abs: 0.6 10*3/uL — ABNORMAL LOW (ref 0.7–4.0)
MCH: 32.1 pg (ref 26.0–34.0)
MCHC: 30.9 g/dL (ref 30.0–36.0)
MCV: 103.8 fL — ABNORMAL HIGH (ref 80.0–100.0)
Monocytes Absolute: 1.1 10*3/uL — ABNORMAL HIGH (ref 0.1–1.0)
Monocytes Relative: 10 %
Neutro Abs: 9.3 10*3/uL — ABNORMAL HIGH (ref 1.7–7.7)
Neutrophils Relative %: 81 %
Platelets: 232 10*3/uL (ref 150–400)
RBC: 1.56 MIL/uL — ABNORMAL LOW (ref 4.22–5.81)
RDW: 16.8 % — ABNORMAL HIGH (ref 11.5–15.5)
WBC: 11.4 10*3/uL — ABNORMAL HIGH (ref 4.0–10.5)
nRBC: 0.2 % (ref 0.0–0.2)

## 2021-01-24 LAB — TROPONIN I (HIGH SENSITIVITY)
Troponin I (High Sensitivity): 14 ng/L (ref <18)
Troponin I (High Sensitivity): 23 ng/L — ABNORMAL HIGH (ref <18)

## 2021-01-24 LAB — I-STAT VENOUS BLOOD GAS, ED
Acid-Base Excess: 3 mmol/L — ABNORMAL HIGH (ref 0.0–2.0)
Bicarbonate: 27.7 mmol/L (ref 20.0–28.0)
Calcium, Ion: 1.14 mmol/L — ABNORMAL LOW (ref 1.15–1.40)
HCT: 15 % — ABNORMAL LOW (ref 39.0–52.0)
Hemoglobin: 5.1 g/dL — CL (ref 13.0–17.0)
O2 Saturation: 96 %
Potassium: 3.6 mmol/L (ref 3.5–5.1)
Sodium: 136 mmol/L (ref 135–145)
TCO2: 29 mmol/L (ref 22–32)
pCO2, Ven: 43.8 mmHg — ABNORMAL LOW (ref 44.0–60.0)
pH, Ven: 7.41 (ref 7.250–7.430)
pO2, Ven: 85 mmHg — ABNORMAL HIGH (ref 32.0–45.0)

## 2021-01-24 LAB — COMPREHENSIVE METABOLIC PANEL
ALT: 15 U/L (ref 0–44)
AST: 17 U/L (ref 15–41)
Albumin: 3.1 g/dL — ABNORMAL LOW (ref 3.5–5.0)
Alkaline Phosphatase: 64 U/L (ref 38–126)
Anion gap: 10 (ref 5–15)
BUN: 47 mg/dL — ABNORMAL HIGH (ref 8–23)
CO2: 26 mmol/L (ref 22–32)
Calcium: 8.5 mg/dL — ABNORMAL LOW (ref 8.9–10.3)
Chloride: 99 mmol/L (ref 98–111)
Creatinine, Ser: 1.82 mg/dL — ABNORMAL HIGH (ref 0.61–1.24)
GFR, Estimated: 39 mL/min — ABNORMAL LOW (ref >60)
Glucose, Bld: 131 mg/dL — ABNORMAL HIGH (ref 70–99)
Potassium: 3.6 mmol/L (ref 3.5–5.1)
Sodium: 135 mmol/L (ref 135–145)
Total Bilirubin: 0.7 mg/dL (ref 0.3–1.2)
Total Protein: 5.9 g/dL — ABNORMAL LOW (ref 6.5–8.1)

## 2021-01-24 LAB — RESP PANEL BY RT-PCR (FLU A&B, COVID) ARPGX2
Influenza A by PCR: NEGATIVE
Influenza B by PCR: NEGATIVE
SARS Coronavirus 2 by RT PCR: NEGATIVE

## 2021-01-24 LAB — LACTIC ACID, PLASMA: Lactic Acid, Venous: 1.6 mmol/L (ref 0.5–1.9)

## 2021-01-24 LAB — PREPARE RBC (CROSSMATCH)

## 2021-01-24 LAB — POC OCCULT BLOOD, ED: Fecal Occult Bld: POSITIVE — AB

## 2021-01-24 LAB — BRAIN NATRIURETIC PEPTIDE: B Natriuretic Peptide: 99.3 pg/mL (ref 0.0–100.0)

## 2021-01-24 MED ORDER — INSULIN ASPART 100 UNIT/ML IJ SOLN
0.0000 [IU] | Freq: Three times a day (TID) | INTRAMUSCULAR | Status: DC
  Administered 2021-01-27: 2 [IU] via SUBCUTANEOUS
  Administered 2021-01-29 – 2021-01-30 (×4): 1 [IU] via SUBCUTANEOUS
  Administered 2021-01-31: 3 [IU] via SUBCUTANEOUS
  Administered 2021-01-31: 1 [IU] via SUBCUTANEOUS

## 2021-01-24 MED ORDER — ALPRAZOLAM 0.5 MG PO TABS
1.0000 mg | ORAL_TABLET | Freq: Every day | ORAL | Status: DC
  Administered 2021-01-24 – 2021-01-30 (×7): 1 mg via ORAL
  Filled 2021-01-24: qty 2
  Filled 2021-01-24: qty 4
  Filled 2021-01-24 (×5): qty 2

## 2021-01-24 MED ORDER — PANTOPRAZOLE SODIUM 40 MG IV SOLR
40.0000 mg | Freq: Once | INTRAVENOUS | Status: AC
  Administered 2021-01-24: 40 mg via INTRAVENOUS
  Filled 2021-01-24: qty 40

## 2021-01-24 MED ORDER — ROSUVASTATIN CALCIUM 5 MG PO TABS
5.0000 mg | ORAL_TABLET | Freq: Every day | ORAL | Status: DC
  Administered 2021-01-25 – 2021-01-31 (×7): 5 mg via ORAL
  Filled 2021-01-24 (×7): qty 1

## 2021-01-24 MED ORDER — ESCITALOPRAM OXALATE 10 MG PO TABS
10.0000 mg | ORAL_TABLET | Freq: Every day | ORAL | Status: DC
  Administered 2021-01-25 – 2021-01-31 (×7): 10 mg via ORAL
  Filled 2021-01-24 (×7): qty 1

## 2021-01-24 MED ORDER — SODIUM CHLORIDE 0.9 % IV SOLN
10.0000 mL/h | Freq: Once | INTRAVENOUS | Status: AC
  Administered 2021-01-24: 10 mL/h via INTRAVENOUS

## 2021-01-24 MED ORDER — SODIUM CHLORIDE 0.9 % IV SOLN
2.0000 g | Freq: Once | INTRAVENOUS | Status: AC
  Administered 2021-01-24: 2 g via INTRAVENOUS
  Filled 2021-01-24: qty 20

## 2021-01-24 MED ORDER — ALBUTEROL SULFATE (2.5 MG/3ML) 0.083% IN NEBU
2.5000 mg | INHALATION_SOLUTION | RESPIRATORY_TRACT | Status: DC | PRN
  Administered 2021-01-26 – 2021-01-29 (×4): 2.5 mg via RESPIRATORY_TRACT
  Filled 2021-01-24 (×4): qty 3

## 2021-01-24 MED ORDER — PANTOPRAZOLE SODIUM 40 MG IV SOLR
40.0000 mg | INTRAVENOUS | Status: DC
  Administered 2021-01-25: 40 mg via INTRAVENOUS
  Filled 2021-01-24: qty 40

## 2021-01-24 MED ORDER — METOPROLOL TARTRATE 12.5 MG HALF TABLET
12.5000 mg | ORAL_TABLET | Freq: Two times a day (BID) | ORAL | Status: DC
  Administered 2021-01-25 – 2021-01-31 (×13): 12.5 mg via ORAL
  Filled 2021-01-24 (×13): qty 1

## 2021-01-24 MED ORDER — ALBUTEROL SULFATE HFA 108 (90 BASE) MCG/ACT IN AERS: 2.0000 | INHALATION_SPRAY | RESPIRATORY_TRACT | Status: DC | PRN

## 2021-01-24 MED ORDER — SODIUM CHLORIDE 0.9 % IV BOLUS
500.0000 mL | Freq: Once | INTRAVENOUS | Status: AC
  Administered 2021-01-24: 500 mL via INTRAVENOUS
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
## 2021-01-24 NOTE — ED Triage Notes (Signed)
See chief complaint 

## 2021-01-24 NOTE — H&P (Signed)
History and Physical        Hospital Admission Note Date: 01/24/2021  Patient name: Sean Riley Medical record number: [id number] Date of birth: 09-Nov-1950 Age: 70 y.o. Gender: male  PCP: Colon Branch, MD    Patient coming from: Home via EMS  I have reviewed all records in the [facility].    Chief Complaint:  Chest Pain, SOB, Dizziness   HPI: Sean Riley is 70 y.o. male with PMH of persistent A. 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 Also endorses generalized weakness. Reports this has been present for 5 days, gradually worsening. Is on 3L O2 by [place] at home for COPD, had to increase this amount and then wife turned back down when O2 saturations stabilized. Per EMS, he was hypoxic in mid 80s on 3L. Chest pain is left sided in nature. Has no diaphoresis, arm pain, jaw claudication.   Notes that he had a bowel movement earlier this week that was black and tarry. He has not had BM since. No hematochezia. No hematemesis. No hemoptysis. No abdominal pain or heartburn symptoms. Has been taking Eliquis.    ED work-up/course:  Patient is a 70 year old male with complicated past medical history including atrial fibrillation anticoagulated on Eliquis, coronary artery disease, COPD, CHF, previous aortic dissection status postrepair in 2018 who presented to the emergency department via EMS with worsening left-sided chest pain (present over the last 5 days worsening about 2 hours prior to arrival), shortness of breath and found to be hypoxic into the mid 80s on his baseline 3 L nasal cannula. Emergency department he was ill-appearing but nontoxic. Borderline hypotensive.  Received 500 of crystalloid by EMS with improvement.  Afebrile on oral check however will check  rectal temp. He is on 5 L nasal cannula with oxygen saturations fluctuating in the low to mid 90s. Active cough during my exam.  I am concerned for pneumonia. Chest x-ray ordered. Also highly considering ACS. EKG as above -atrial fibrillation with nonspecific ST-T wave changes. Ordered for cardiac biomarkers as well as BNP.  Please see ED clinical course above for further medical decision making.   ED course: Patient is critically ill for the following reasons: Acute blood loss anemia causing hemorrhagic shock, acute hypoxic respiratory failure likely in the setting of poor perfusion and global ischemia, unstable angina. Impression is likely symptomatic anemia, likely GI loss in the setting of systemic anticoagulation.  No hematemesis. I ordered for a dose of IV Protonix and Rocephin. Patient getting blood transfused. Remained hemodynamically stable after crystalloid transfusion.  Admitted to medicine for further treatment.  Review of Systems: Positives marked in 'bold' Constitutional: Denies fever, chills, diaphoresis, poor appetite and fatigue.  HEENT: Denies photophobia, eye pain, redness, hearing loss, ear pain, congestion, sore throat, rhinorrhea, sneezing, mouth sores, trouble swallowing, neck pain, neck stiffness and tinnitus.   Respiratory: Denies SOB, DOE, cough, chest tightness,  and wheezing.   Cardiovascular: Denies chest pain, palpitations and leg swelling.  Gastrointestinal: Denies nausea, vomiting, abdominal pain, diarrhea, constipation, blood in  stool and abdominal distention.  Genitourinary: Denies dysuria, urgency, frequency, hematuria, flank pain and difficulty urinating.  Musculoskeletal: Denies myalgias, back pain, joint swelling, arthralgias and gait problem.  Skin: Denies pallor, rash and wound.  Neurological: Denies dizziness, seizures, syncope, weakness, light-headedness, numbness and headaches.  Hematological: Denies adenopathy. Easy bruising, personal or family  bleeding history  Psychiatric/Behavioral: Denies suicidal ideation, mood changes, confusion, nervousness, sleep disturbance and agitation  Past Medical History: 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[place]) 03/23/2017   Atrial fibrillation (HCC)    Cataract    removed both eyes    CHF (congestive heart failure) (HCC)    GERD (gastroesophageal reflux disease)    HYPERLIPIDEMIA    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 LUTS    Hypertension    Microscopic hematuria    negative cystoscopy   NONSPECIFIC ABNORMAL ELECTROCARDIOGRAM    Pleural effusion on right    Pleural effusion, bilateral    S/P aortic dissection repair 03/24/2017   Biological Bentall aortic root replacement + resection and grafting of entire ascending aorta, transverse aortic arch and proximal descending thoracic aorta with elephant trunk distal anastomosis and debranching of aortic arch vessels   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   25 mm [facility] Ease bovine pericardial tissue valve and 28 mm Gelweave Valsalva aortic root graft with reimplantation of left main and right coronary arteries   Varicose veins     Past Surgical History:  Procedure Laterality Date   APPENDECTOMY     CARDIAC CATHETERIZATION      X3;Dr Caryl Comes, last 01/20/12: mild non-obstructive CAD, normal LV systolic function   CARDIOVERSION N/A 05/31/2018   Procedure: CARDIOVERSION;  Surgeon: Sueanne Margarita, MD;  Location: [facility] ENDOSCOPY;  Service: Cardiovascular;  Laterality: N/A;   CARDIOVERSION N/A 06/13/2020   Procedure: CARDIOVERSION;  Surgeon: Larey Dresser, MD;  Location: [place];  Service: Cardiovascular;  Laterality: N/A;   COLONOSCOPY     CYSTOSCOPY  1978   Dr Hartley Barefoot   EYE SURGERY     IR THORACENTESIS ASP PLEURAL SPACE W/IMG GUIDE  11/25/2017   KNEE ARTHROSCOPY  2012   Dr Theda Sers   LUMBAR LAMINECTOMY  1987   Dr Lestine Box   2007   PERICARDIOCENTESIS N/A 04/07/2017   Procedure: PERICARDIOCENTESIS;  Surgeon: Sherren Mocha, MD;  Location: [place] CV LAB;  Service: Cardiovascular;  Laterality: N/A;   POLYPECTOMY     PRESSURE SENSOR/CARDIOMEMS N/A 12/26/2020   Procedure: PRESSURE SENSOR/CARDIOMEMS;  Surgeon: Larey Dresser, MD;  Location: [place] CV LAB;  Service: Cardiovascular;  Laterality: N/A;   REPAIR OF ACUTE ASCENDING THORACIC AORTIC DISSECTION N/A 03/23/2017   Procedure: REPAIR OF ACUTE ASCENDING THORACIC AORTIC DISSECTION.  Bentall procedure.  Aortic root repleacement with bioprosthetic valve.  Reimplantation of left and right coronary arteries.  Total resection of transverse aortic arch.  Elephant trunk distal anastomosis and debranching of arch vessels.;  Surgeon: Rexene Alberts, MD;  Location: [place];  Service: Vascular;  Laterality: N/A;   RIGHT HEART CATH N/A 06/15/2019   Procedure: RIGHT HEART CATH;  Surgeon: Larey Dresser, MD;  Location: [place] CV LAB;  Service: Cardiovascular;  Laterality: N/A;   ROTATOR CUFF REPAIR     SHOULDER ARTHROSCOPY  2011   Dr Theda Sers   SHOULDER ARTHROSCOPY Right 08/2015   SPINAL FUSION  1986   Dr Rolin Barry  TEE WITHOUT CARDIOVERSION N/A 03/23/2017   Procedure: TRANSESOPHAGEAL ECHOCARDIOGRAM (TEE);  Surgeon: Rexene Alberts, MD;  Location: [place];  Service: Open Heart Surgery;  Laterality: N/A;   THORACIC AORTIC ENDOVASCULAR STENT GRAFT N/A 03/30/2018   Procedure: THORACIC AORTIC ENDOVASCULAR STENT GRAFT WITH INTRAVASCULAR ULTRASOUND;  Surgeon: Serafina Mitchell, MD;  Location: [facility] OR;  Service: Vascular;  Laterality: N/A;   TRACHEOSTOMY     age 72 for croup    Medications: Prior to Admission medications   Medication Sig Start Date End Date Taking? Authorizing Provider  albuterol (PROAIR HFA) 108 (90 Base) MCG/ACT inhaler Inhale 2 puffs into the lungs every 4 (four) hours as needed for wheezing or shortness of breath. 03/30/19  Yes Paz, Alda Berthold, MD  ALPRAZolam  Duanne Moron) 1 MG tablet TAKE 1-1&1/2 TABLETS BY MOUTH AT BEDTIME Patient taking differently: Take 1 mg by mouth at bedtime. 10/30/20  Yes Paz, Alda Berthold, MD  apixaban (ELIQUIS) 5 MG TABS tablet Take 1 tablet (5 mg total) by mouth 2 (two) times daily for 1 day. 12/26/20 01/24/21 Yes Larey Dresser, MD  augmented betamethasone dipropionate (DIPROLENE-AF) 0.05 % cream Apply topically 2 (two) times daily as needed. Patient taking differently: Apply 1 application topically 2 (two) times daily as needed (back rash). 12/25/19  Yes Paz, Alda Berthold, MD  bisacodyl (DULCOLAX) 5 MG EC tablet Take 5 mg by mouth daily as needed for moderate constipation.   Yes [provider]  diphenhydrAMINE (BENADRYL) 25 MG tablet Take 25 mg by mouth daily as needed (allergies/runny nose).   Yes [provider]  empagliflozin (JARDIANCE) 10 MG TABS tablet Take 1 tablet (10 mg total) by mouth daily before breakfast. 12/04/20  Yes Larey Dresser, MD  escitalopram (LEXAPRO) 10 MG tablet TAKE 1 TABLET DAILY Patient taking differently: Take 10 mg by mouth in the morning. 09/19/20  Yes Colon Branch, MD  esomeprazole (NEXIUM) 40 MG capsule Take 1 capsule (40 mg total) by mouth daily before breakfast. 01/05/17  Yes Kozlow, Donnamarie Poag, MD  flecainide (TAMBOCOR) 50 MG tablet TAKE ONE AND ONE-HALF TABLETS TWICE A DAY Patient taking differently: Take 75 mg by mouth 2 (two) times daily. 11/29/20  Yes Deboraha Sprang, MD  fluticasone [facility]) 50 MCG/ACT nasal spray Place 1 spray into both nostrils 2 (two) times daily as needed for allergies. Use one spray in each nostril twice daily. Patient taking differently: Place 1 spray into both nostrils 2 (two) times daily as needed for allergies. 05/31/18  Yes Turner, Eber Hong, MD  hydroxypropyl methylcellulose / hypromellose (ISOPTO TEARS / GONIOVISC) 2.5 % ophthalmic solution Place 1-2 drops into both eyes 3 (three) times daily as needed (dry/irritated eyes).   Yes [provider]   ipratropium-albuterol (DUONEB) 0.5-2.5 (3) MG/3ML SOLN Take 3 mLs by nebulization 4 (four) times daily. 06/11/20  Yes Mannam, Praveen, MD  KLOR-CON M20 20 MEQ tablet TAKE 2 TABLETS (40 MEQ) DAILY Patient taking differently: Take 40 mEq by mouth at bedtime. 05/17/20  Yes Larey Dresser, MD  losartan (COZAAR) 25 MG tablet Take 1 tablet (25 mg total) by mouth daily. 11/18/20  Yes Paz, Alda Berthold, MD  magnesium hydroxide (MILK OF MAGNESIA) 400 MG/5ML suspension Take 7.5 mLs by mouth daily as needed (stomach cramps/constipation).   Yes [provider]  metoprolol tartrate (LOPRESSOR) 25 MG tablet Take 0.5 tablets (12.5 mg total) by mouth 2 (two) times daily. Please make yearly appt with Dr. Caryl Comes for January 2022 for future refills.  Thank you 1st attempt 09/08/20  Yes Shelly Coss, MD  OXYGEN Inhale 3-4 L/min into the lungs continuous.   Yes [provider]  rosuvastatin (CRESTOR) 5 MG tablet Take 1 tablet (5 mg total) by mouth daily. Patient taking differently: Take 5 mg by mouth at bedtime. 11/29/20  Yes Paz, Alda Berthold, MD  torsemide (DEMADEX) 20 MG tablet Take 3 tablets (60 mg total) by mouth daily. 12/26/20  Yes Larey Dresser, MD  albuterol (PROVENTIL) (2.5 MG/3ML) 0.083% nebulizer solution Take 3 mLs (2.5 mg total) by nebulization every 8 (eight) hours as needed for wheezing or shortness of breath. Patient not taking: No sig reported 04/30/20   Colon Branch, MD  aspirin (ASPIRIN CHILDRENS) 81 MG chewable tablet Chew 1 tablet (81 mg total) by mouth daily. Patient not taking: No sig reported 12/26/20 01/25/21  Larey Dresser, MD  tamoxifen (NOLVADEX) 10 MG tablet TAKE 1 TABLET TWICE A DAY Patient not taking: No sig reported 11/29/20   Renato Shin, MD    Allergies:   Allergies  Allergen Reactions   Simvastatin Other (See Comments)    Mental status changes   Celecoxib Other (See Comments)    GI UPSET AND INFLAMMATION   Codeine Other (See Comments)    GI UPSET AND INFLAMMATION    Nsaids Other (See Comments)    GI UPSET AND INFLAMMATION (can tolerate via IV)   Tape Other (See Comments)    Medical tape and Band-Aids PULL OFF THE SKIN; please use [place] wrap   Cephalexin Itching and Rash   Latex Rash    Social History:  reports that he quit smoking about 3 years ago. His smoking use included cigarettes. He has a 90.00 pack-year smoking history. He has never used smokeless tobacco. He reports previous alcohol use. He reports that he does not use drugs.  Family History: Family History  Problem Relation Age of Onset   Hypertension Mother    Hypertension Father    Benign prostatic hyperplasia Father        S/P TURP   Heart attack Maternal Grandmother        MI in 16s   Breast cancer Maternal Grandmother    Arrhythmia Brother         X 2   Heart attack Maternal Aunt        MI in 75s   Stroke Neg Hx    Diabetes Neg Hx    Colon cancer Neg Hx    Colon polyps Neg Hx    Stomach cancer Neg Hx    Rectal cancer Neg Hx    Esophageal cancer Neg Hx    Prostate cancer Neg Hx    Other Neg Hx        gynecomastia    Physical Exam: Blood pressure (!) 110/50, pulse 94, temperature 98.9 F (37.2 C), temperature source Oral, resp. rate (!) 21, height 6' (1.829 m), weight 89.3 kg, SpO2 100 %. General: Alert, awake, oriented x3, in no acute distress. Eyes: pink conjunctiva,anicteric sclera, pupils equal and reactive to light and accomodation, HEENT: normocephalic, atraumatic, oropharynx clear, mucus membranes dry  Neck: supple, no masses or lymphadenopathy, no goiter, no bruits, no JVD CVS: Tachycardia. Irregular rhythm. without murmurs, rubs or gallops. No lower extremity edema Resp : Mild tachypnea present. No decreased breath sounds. Rhonchi throughout. No wheezes or crackles.  GI : Soft, nontender, nondistended, positive bowel sounds, no masses. No hepatomegaly. No hernia.  Musculoskeletal: No clubbing or cyanosis, positive pedal pulses.  No contracture. ROM intact   Neuro: Grossly intact, no focal neurological deficits, strength 5/5 upper and lower extremities bilaterally Psych: alert and oriented x 3, normal mood and affect Skin: no rashes or lesions, warm and dry   LABS on Admission: I have personally reviewed all the labs and imagings below    Basic Metabolic Panel: Recent Labs  Lab 01/24/21 1829 01/24/21 1845  NA 135 136  K 3.6 3.6  CL 99  --   CO2 26  --   GLUCOSE 131*  --   BUN 47*  --   CREATININE 1.82*  --   CALCIUM 8.5*  --    Liver Function Tests: Recent Labs  Lab 01/24/21 1829  AST 17  ALT 15  ALKPHOS 64  BILITOT 0.7  PROT 5.9*  ALBUMIN 3.1*   No results for input(s): LIPASE, AMYLASE in the last 168 hours. No results for input(s): AMMONIA in the last 168 hours. CBC: Recent Labs  Lab 01/24/21 1829 01/24/21 1845  WBC 11.4*  --   NEUTROABS 9.3*  --   HGB 5.0* 5.1*  HCT 16.2* 15.0*  MCV 103.8*  --   PLT 232  --    Cardiac Enzymes: No results for input(s): CKTOTAL, CKMB, CKMBINDEX, TROPONINI in the last 168 hours. BNP: Invalid input(s): POCBNP CBG: No results for input(s): GLUCAP in the last 168 hours.  Radiological Exams on Admission:  DG Chest Port 1 View  Result Date: 01/24/2021 CLINICAL DATA:  Cough hypoxia EXAM: PORTABLE CHEST 1 VIEW COMPARISON:  08/29/2020, CT 01/15/2021, 04/30/2020, 09/05/2020 FINDINGS: Post sternotomy changes. Aortic stent graft redemonstrated. Clips over the right axillary region. Similar loculated bilateral pleural effusions and airspace disease at the bases. Cardiomegaly. No pneumothorax. IMPRESSION: Overall no significant change compared with recent prior. Cardiomegaly with similar loculated pleural effusions and basilar airspace disease. Electronically Signed   By: Donavan Foil M.D.   On: 01/24/2021 19:23      EKG: Independently reviewed. Afib. RBBB and LAFB.    Assessment/Plan Active Problems:   Hyperlipidemia   tobacco use d/o   Atrial fibrillation (HCC)   BPH (benign  prostatic hyperplasia)   RBBB   HTN (hypertension)   S/P aortic dissection repair   COPD (chronic obstructive pulmonary disease) (HCC)   Atrial tachycardia (HCC)   Symptomatic anemia   Acute on chronic respiratory failure with hypoxia (HCC)   GI bleed  Symptomatic Anemia with GI Bleed  Patient presenting with chest pain, SOB, dizziness. Found to be hypoxic to mid 80s on baseline 3L [place]. Received 500 cc crystalloid by EMS. HgB 5.0 at presentation. Initially was mildly hypotensive, now hemodynamically stable. Received additional 500 cc NS bolus in ED. Has remote history of Fe deficiency anemia, not currently on supplement. Has no history of esophageal varices. Denies excessive NSAID use. Is on anticoagulation for Afib. Notes melena at home. FOBT+ in ED. Had colonoscopy in 2017 with precancerous polyps; was recommended to repeat in 3 years. Has not had repeat colonoscopy since. CTX and Protonix given in ED. 2u PRBC ordered for transfusion.  -admit to telemetry with continuous pulse ox  -transfuse as above, then repeat H&H 2h post transfusion -monitor CBC  -O2 therapy, decrease as able  -currently hemodynamically stable, will need GI consult in AM and likely needs scopes  -insert 2nd PIV -IV Protonix   Chest Pain  Suspect related to anemia. Troponin normal. EKG does not show any signs of STEMI. Does have significant cardiac risk factors. BNP within normal limits.  CXR unchanged from prior without pleural effusions and basilar airspace disease.  -monitor on telemetry  -trend troponins  -consider cards consult if does not improve as anemia is treated   Afib  Seen on EKG at admit. HR is mildly elevated, likely related to anemia.  -hold Eliquis given anemia and active bleed  -continue Metoprolol, hold if hypotensive  -continue Tambocor   COPD Acute on chronic respiratory failure related to above.  -supplemental O2 with continuous pulse ox  -question why patient does not seem to be on  controller inhaler for COPD, this likely needs to be addressed for long term management of respiratory status   Chronic Diastolic HF  Appears to be hypovolemic at presentation. Last echo EF 60-65% with right ventricular function mildly reduced.  -monitor volume status as receives fluids and blood products  -strict I/Os -daily weights  -hold Torsemide for now   Leukocytosis WBC 11.4. Had leukocytosis in Jan 2022 to 12 at admission with no clear etiology. No known recent steroid use. Afebrile. CXR without infectious process. Lactic acid 1.6. Covid and flu neg.  -UA pending -urine and blood cultures ordered in ED  -CBC in AM   Impaired Glucose Tolerance  No recent A1c available for review.  -SSI  -hold Jardiance due to hypovolemia  -A1c   CKD III  At baseline Cr with result of 1.8.  -monitor BMET -avoid nephrotoxins   Chronic anxiety/depression -Resume home regimen   Hyperlipidemia -Resume home regimen.      DVT prophylaxis: SCDs, hold Eliquis due to bleed   CODE STATUS: FULL   Consults called: GI in AM   Family Communication: Admission, patients condition and plan of care including tests being ordered have been discussed with the patient and patient's wife who indicates understanding and agree with the plan and Code Status  Admission status: Inpatient   The medical decision making on this patient was of high complexity and the patient is at high risk for clinical deterioration, therefore this is a level 3 admission.  Severity of Illness:      The appropriate patient status for this patient is INPATIENT. Inpatient status is judged to be reasonable and necessary in order to provide the required intensity of service to ensure the patient's safety. The patient's presenting symptoms, physical exam findings, and initial radiographic and laboratory data in the context of their chronic comorbidities is felt to place them at high risk for further clinical deterioration.  Furthermore, it is not anticipated that the patient will be medically stable for discharge from the hospital within 2 midnights of admission. The following factors support the patient status of inpatient.   " The patient's presenting symptoms include SOB, chest pain, dizziness. " The worrisome physical exam findings include tachycardia with irregular rhythm. " The initial radiographic and laboratory data are worrisome because of HgB 5. " The chronic co-morbidities include ersistent A. 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   * I certify that at the point of admission it is my clinical judgment that the patient will require inpatient hospital care spanning beyond 2 midnights from the point of admission due to high intensity of service, high risk for further deterioration and high frequency of surveillance required.*   Time Spent on Admission: 60 minutes      Melina Schools D.O.  [facility] 01/24/2021, 10:01 PM

## 2021-01-24 NOTE — Telephone Encounter (Signed)
Pts wife left VM stating pts heart rate is fluctuating between 54-92. Pt is asymptomatic. Wife said last time this happened he needed a DCCV. I spoke with the afib clinic and they will see him Tuesday at 3:30pm. Pts wife aware and agreeable with plan. Wife advised if pt became symptomatic over the weekend to take him to the emergency room.

## 2021-01-24 NOTE — ED Triage Notes (Signed)
Initial b/p via EMS was 108/50 and decreased to 90/46 via EMS and then 500cc bolus NS with b/p to 94/39 and last b/p was 88/40, a/o X 4, no acute distress

## 2021-01-24 NOTE — ED Provider Notes (Signed)
[facility] EMERGENCY DEPARTMENT Provider Note   CSN: [id number] Arrival date & time: 01/24/21  1750     History Chief Complaint  Patient presents with   Chest Pain   Shortness of Breath   Dizziness    CP, SOB, DIZZINESS since 0500 today. Chest pressure nonradiating with a range of 4 to 10, pale, cool and diaphoretic    Sean Riley is a 70 y.o. male.  The history is provided by the patient, the EMS personnel and medical records.  Chest Pain Pain location:  L chest Pain quality: crushing and pressure   Pain radiates to:  L shoulder Pain severity:  Moderate Onset quality:  Gradual Duration:  5 days Timing:  Constant Progression:  Worsening Chronicity:  Recurrent Context: not breathing, not drug use, not eating, not intercourse, not lifting, not movement, not raising an arm, not at rest, not stress and not trauma   Relieved by:  Nothing Worsened by:  Exertion, deep breathing and coughing Ineffective treatments:  None tried Associated symptoms: cough, dizziness, fatigue, nausea and shortness of breath   Associated symptoms: no abdominal pain, no AICD problem, no altered mental status, no anorexia, no anxiety, no back pain, no claudication, no diaphoresis, no dysphagia, no fever, no headache, no heartburn, no lower extremity edema, no near-syncope, no numbness, no orthopnea, no palpitations, no PND, no syncope, no vomiting and no weakness   Cough:    Cough characteristics:  Productive   Sputum characteristics:  Nondescript   Severity:  Moderate   Onset quality:  Gradual   Duration:  3 days   Timing:  Constant   Progression:  Worsening   Chronicity:  New Risk factors: aortic disease, coronary artery disease, high cholesterol, hypertension and male sex   Shortness of Breath Severity:  Severe Onset quality:  Gradual Duration:  4 days Timing:  Constant Progression:  Worsening Chronicity:  New Associated symptoms: chest pain and cough   Associated  symptoms: no abdominal pain, no claudication, no diaphoresis, no ear pain, no fever, no headaches, no PND, no rash, no sore throat, no syncope and no vomiting   Dizziness Associated symptoms: chest pain, nausea and shortness of breath   Associated symptoms: no headaches, no palpitations, no syncope, no vomiting and no weakness    Left-sided chest pain for the past 5 days, worsening today approximately 2 hours prior to arrival with associated shortness of breath. EMS noted him to be hypoxic on his baseline 3 L nasal cannula in the mid 80s. Patient tells me that his chronic cough has been getting more frequent, worse and productive recently.      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[place]) 03/23/2017   Atrial fibrillation (HCC)    Cataract    removed both eyes    CHF (congestive heart failure) (HCC)    GERD (gastroesophageal reflux disease)    HYPERLIPIDEMIA    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 LUTS    Hypertension    Microscopic hematuria    negative cystoscopy   NONSPECIFIC ABNORMAL ELECTROCARDIOGRAM    Pleural effusion on right    Pleural effusion, bilateral    S/P aortic dissection repair 03/24/2017   Biological Bentall aortic root replacement + resection and grafting of entire ascending aorta, transverse aortic arch and proximal descending thoracic aorta with elephant trunk distal anastomosis and debranching of aortic arch vessels   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   25 mm [facility] Ease bovine pericardial tissue valve and 28 mm Gelweave Valsalva aortic root graft with reimplantation of left Riley and right coronary arteries   Varicose veins     Patient Active Problem List   Diagnosis Date Noted   Symptomatic anemia 01/24/2021   Acute on chronic respiratory failure with hypoxia ([place]) 01/24/2021   GI bleed 01/24/2021   Chest pain 01/24/2021   Leukocytosis 01/24/2021    CKD (chronic kidney disease) 01/24/2021   Acute respiratory failure with hypoxia ([place]) 09/06/2020   Secondary hypercoagulable state ([place]) 06/26/2020   Hormonal disorder 10/16/2019   Insomnia 12/29/2018   Chronic anemia 12/29/2018   Atrial tachycardia ([place])    Thoracic aortic aneurysm without rupture ([place]) 03/30/2018   COPD (chronic obstructive pulmonary disease) ([place]) 10/06/2017   Psoriasis 09/14/2017   Depression 04/18/2017   Pericardial effusion    S/P aortic dissection repair 03/24/2017   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   Elevated PSA 01/11/2016   PCP NOTES >>>>>>> 07/24/2015   Gynecomastia, male 10/10/2014   Allergic rhinitis 09/10/2014   Dermatitis 05/11/2014   Annual physical exam 05/10/2014   Erectile dysfunction 05/10/2014   HTN (hypertension) 10/28/2013   Bronchospasm 10/28/2013   ETOH abuse 10/28/2013   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/2011   RBBB 11/14/2008   Hyperlipidemia 03/09/2008   tobacco use d/o 03/09/2008   Atrial fibrillation ([place]) 03/09/2008   GERD 03/09/2008   Microscopic hematuria 03/09/2008   BPH (benign prostatic hyperplasia) 03/09/2008    Past Surgical History:  Procedure Laterality Date   APPENDECTOMY     CARDIAC CATHETERIZATION      X3;Dr Caryl Comes, last 01/20/12: mild non-obstructive CAD, normal LV systolic function   CARDIOVERSION N/A 05/31/2018   Procedure: CARDIOVERSION;  Surgeon: Sueanne Margarita, MD;  Location: [facility] ENDOSCOPY;  Service: Cardiovascular;  Laterality: N/A;   CARDIOVERSION N/A 06/13/2020   Procedure: CARDIOVERSION;  Surgeon: Larey Dresser, MD;  Location: [facility];  Service: Cardiovascular;  Laterality: N/A;   COLONOSCOPY     CYSTOSCOPY  1978   Dr Hartley Barefoot   EYE SURGERY     IR THORACENTESIS ASP PLEURAL SPACE W/IMG GUIDE  11/25/2017   KNEE ARTHROSCOPY  2012   Dr Theda Sers   LUMBAR LAMINECTOMY  1987   Dr Lestine Box  2007   PERICARDIOCENTESIS N/A  04/07/2017   Procedure: PERICARDIOCENTESIS;  Surgeon: Sherren Mocha, MD;  Location: [facility] CV LAB;  Service: Cardiovascular;  Laterality: N/A;   POLYPECTOMY     PRESSURE SENSOR/CARDIOMEMS N/A 12/26/2020   Procedure: PRESSURE SENSOR/CARDIOMEMS;  Surgeon: Larey Dresser, MD;  Location: [facility] CV LAB;  Service: Cardiovascular;  Laterality: N/A;   REPAIR OF ACUTE ASCENDING THORACIC AORTIC DISSECTION N/A 03/23/2017   Procedure: REPAIR OF ACUTE ASCENDING THORACIC AORTIC DISSECTION.  Bentall procedure.  Aortic root repleacement with bioprosthetic valve.  Reimplantation of left and right coronary arteries.  Total resection of transverse aortic arch.  Elephant trunk distal anastomosis and debranching of arch vessels.;  Surgeon: Rexene Alberts, MD;  Location: [facility];  Service: Vascular;  Laterality: N/A;   RIGHT HEART CATH N/A 06/15/2019   Procedure: RIGHT HEART CATH;  Surgeon: Larey Dresser, MD;  Location: [facility] CV LAB;  Service: Cardiovascular;  Laterality: N/A;   ROTATOR CUFF REPAIR     SHOULDER ARTHROSCOPY  2011   Dr Theda Sers   SHOULDER ARTHROSCOPY Right 08/2015  SPINAL FUSION  1986   Dr Rolin Barry   TEE WITHOUT CARDIOVERSION N/A 03/23/2017   Procedure: TRANSESOPHAGEAL ECHOCARDIOGRAM (TEE);  Surgeon: Rexene Alberts, MD;  Location: [facility];  Service: Open Heart Surgery;  Laterality: N/A;   THORACIC AORTIC ENDOVASCULAR STENT GRAFT N/A 03/30/2018   Procedure: THORACIC AORTIC ENDOVASCULAR STENT GRAFT WITH INTRAVASCULAR ULTRASOUND;  Surgeon: Serafina Mitchell, MD;  Location: [facility] OR;  Service: Vascular;  Laterality: N/A;   TRACHEOSTOMY     age 21 for croup       Family History  Problem Relation Age of Onset   Hypertension Mother    Hypertension Father    Benign prostatic hyperplasia Father        S/P TURP   Heart attack Maternal Grandmother        MI in 29s   Breast cancer Maternal Grandmother    Arrhythmia Brother         X 2   Heart attack Maternal Aunt        MI in 87s    Stroke Neg Hx    Diabetes Neg Hx    Colon cancer Neg Hx    Colon polyps Neg Hx    Stomach cancer Neg Hx    Rectal cancer Neg Hx    Esophageal cancer Neg Hx    Prostate cancer Neg Hx    Other Neg Hx        gynecomastia    Social History   Tobacco Use   Smoking status: Former    Packs/day: 2.00    Years: 45.00    Pack years: 90.00    Types: Cigarettes    Quit date: 03/22/2017    Years since quitting: 3.8   Smokeless tobacco: Never   Tobacco comments:    2 ppd , quit 03/2017  Vaping Use   Vaping Use: Never used  Substance Use Topics   Alcohol use: Not Currently    Alcohol/week: 0.0 standard drinks    Comment: 5 cans of beer daily   Drug use: No    Home Medications Prior to Admission medications   Medication Sig Start Date End Date Taking? Authorizing Provider  albuterol (PROAIR HFA) 108 (90 Base) MCG/ACT inhaler Inhale 2 puffs into the lungs every 4 (four) hours as needed for wheezing or shortness of breath. 03/30/19  Yes Paz, Alda Berthold, MD  ALPRAZolam Duanne Moron) 1 MG tablet TAKE 1-1&1/2 TABLETS BY MOUTH AT BEDTIME Patient taking differently: Take 1 mg by mouth at bedtime. 10/30/20  Yes Paz, Alda Berthold, MD  apixaban (ELIQUIS) 5 MG TABS tablet Take 1 tablet (5 mg total) by mouth 2 (two) times daily for 1 day. 12/26/20 01/24/21 Yes Larey Dresser, MD  augmented betamethasone dipropionate (DIPROLENE-AF) 0.05 % cream Apply topically 2 (two) times daily as needed. Patient taking differently: Apply 1 application topically 2 (two) times daily as needed (back rash). 12/25/19  Yes Paz, Alda Berthold, MD  bisacodyl (DULCOLAX) 5 MG EC tablet Take 5 mg by mouth daily as needed for moderate constipation.   Yes [provider]  diphenhydrAMINE (BENADRYL) 25 MG tablet Take 25 mg by mouth daily as needed (allergies/runny nose).   Yes [provider]  empagliflozin (JARDIANCE) 10 MG TABS tablet Take 1 tablet (10 mg total) by mouth daily before breakfast. 12/04/20  Yes Larey Dresser, MD   escitalopram (LEXAPRO) 10 MG tablet TAKE 1 TABLET DAILY Patient taking differently: Take 10 mg by mouth in the morning. 09/19/20  Yes Paz, [place]  E, MD  esomeprazole (NEXIUM) 40 MG capsule Take 1 capsule (40 mg total) by mouth daily before breakfast. 01/05/17  Yes Kozlow, Donnamarie Poag, MD  flecainide (TAMBOCOR) 50 MG tablet TAKE ONE AND ONE-HALF TABLETS TWICE A DAY Patient taking differently: Take 75 mg by mouth 2 (two) times daily. 11/29/20  Yes Deboraha Sprang, MD  fluticasone [facility]) 50 MCG/ACT nasal spray Place 1 spray into both nostrils 2 (two) times daily as needed for allergies. Use one spray in each nostril twice daily. Patient taking differently: Place 1 spray into both nostrils 2 (two) times daily as needed for allergies. 05/31/18  Yes Turner, Eber Hong, MD  hydroxypropyl methylcellulose / hypromellose (ISOPTO TEARS / GONIOVISC) 2.5 % ophthalmic solution Place 1-2 drops into both eyes 3 (three) times daily as needed (dry/irritated eyes).   Yes [provider]  ipratropium-albuterol (DUONEB) 0.5-2.5 (3) MG/3ML SOLN Take 3 mLs by nebulization 4 (four) times daily. 06/11/20  Yes Mannam, Praveen, MD  KLOR-CON M20 20 MEQ tablet TAKE 2 TABLETS (40 MEQ) DAILY Patient taking differently: Take 40 mEq by mouth at bedtime. 05/17/20  Yes Larey Dresser, MD  losartan (COZAAR) 25 MG tablet Take 1 tablet (25 mg total) by mouth daily. 11/18/20  Yes Paz, Alda Berthold, MD  magnesium hydroxide (MILK OF MAGNESIA) 400 MG/5ML suspension Take 7.5 mLs by mouth daily as needed (stomach cramps/constipation).   Yes [provider]  metoprolol tartrate (LOPRESSOR) 25 MG tablet Take 0.5 tablets (12.5 mg total) by mouth 2 (two) times daily. Please make yearly appt with Dr. Caryl Comes for January 2022 for future refills. Thank you 1st attempt 09/08/20  Yes Shelly Coss, MD  OXYGEN Inhale 3-4 L/min into the lungs continuous.   Yes [provider]  rosuvastatin (CRESTOR) 5 MG tablet Take 1 tablet (5 mg total) by  mouth daily. Patient taking differently: Take 5 mg by mouth at bedtime. 11/29/20  Yes Paz, Alda Berthold, MD  torsemide (DEMADEX) 20 MG tablet Take 3 tablets (60 mg total) by mouth daily. 12/26/20  Yes Larey Dresser, MD  albuterol (PROVENTIL) (2.5 MG/3ML) 0.083% nebulizer solution Take 3 mLs (2.5 mg total) by nebulization every 8 (eight) hours as needed for wheezing or shortness of breath. Patient not taking: No sig reported 04/30/20   Colon Branch, MD  aspirin (ASPIRIN CHILDRENS) 81 MG chewable tablet Chew 1 tablet (81 mg total) by mouth daily. Patient not taking: No sig reported 12/26/20 01/25/21  Larey Dresser, MD  tamoxifen (NOLVADEX) 10 MG tablet TAKE 1 TABLET TWICE A DAY Patient not taking: No sig reported 11/29/20   Renato Shin, MD    Allergies    Simvastatin, Celecoxib, Codeine, Nsaids, Tape, Cephalexin, and Latex  Review of Systems   Review of Systems  Constitutional:  Positive for activity change, chills and fatigue. Negative for diaphoresis and fever.  HENT:  Negative for ear pain, sore throat and trouble swallowing.   Eyes:  Negative for pain and visual disturbance.  Respiratory:  Positive for cough and shortness of breath.   Cardiovascular:  Positive for chest pain. Negative for palpitations, orthopnea, claudication, syncope, PND and near-syncope.  Gastrointestinal:  Positive for nausea. Negative for abdominal pain, anorexia, heartburn and vomiting.  Genitourinary:  Negative for dysuria and hematuria.  Musculoskeletal:  Negative for arthralgias and back pain.  Skin:  Negative for color change and rash.  Neurological:  Positive for dizziness. Negative for seizures, syncope, weakness, numbness and headaches.  All other systems reviewed and are negative.  Physical Exam Updated Vital Signs BP (!) 107/52   Pulse 85   Temp 98.1 F (36.7 C) (Oral)   Resp (!) 25   Ht 6' (1.829 m)   Wt 89.3 kg   SpO2 99%   BMI 26.70 kg/m   Physical Exam Vitals and nursing note reviewed.   Constitutional:      Appearance: He is well-developed and overweight. He is ill-appearing.     Interventions: Nasal cannula in place.     Comments:  5L [place]  HENT:     Head: Normocephalic and atraumatic.     Mouth/Throat:     Mouth: Mucous membranes are dry.  Eyes:     Conjunctiva/sclera: Conjunctivae normal.  Cardiovascular:     Rate and Rhythm: Tachycardia present. Rhythm irregularly irregular.     Pulses:          Radial pulses are 2+ on the right side and 2+ on the left side.     Heart sounds: Murmur heard.  Systolic murmur is present.  Pulmonary:     Effort: Pulmonary effort is normal. Tachypnea present. No accessory muscle usage, respiratory distress or retractions.     Breath sounds: Examination of the left-middle field reveals rhonchi. Examination of the left-lower field reveals rales. Rhonchi and rales present. No decreased breath sounds.  Abdominal:     Palpations: Abdomen is soft.     Tenderness: There is no abdominal tenderness.  Musculoskeletal:     Cervical back: Neck supple.     Right lower leg: 1+ Pitting Edema present.     Left lower leg: 1+ Pitting Edema present.  Skin:    General: Skin is warm and dry.  Neurological:     General: No focal deficit present.     Mental Status: He is alert and oriented to person, place, and time.     GCS: GCS eye subscore is 4. GCS verbal subscore is 5. GCS motor subscore is 6.  Psychiatric:        Behavior: Behavior is cooperative.    ED Results / Procedures / Treatments   Labs (all labs ordered are listed, but only abnormal results are displayed) Labs Reviewed  COMPREHENSIVE METABOLIC PANEL - Abnormal; Notable for the following components:      Result Value   Glucose, Bld 131 (*)    BUN 47 (*)    Creatinine, Ser 1.82 (*)    Calcium 8.5 (*)    Total Protein 5.9 (*)    Albumin 3.1 (*)    GFR, Estimated 39 (*)    All other components within normal limits  CBC WITH DIFFERENTIAL/PLATELET - Abnormal; Notable for the  following components:   WBC 11.4 (*)    RBC 1.56 (*)    Hemoglobin 5.0 (*)    HCT 16.2 (*)    MCV 103.8 (*)    RDW 16.8 (*)    Neutro Abs 9.3 (*)    Lymphs Abs 0.6 (*)    Monocytes Absolute 1.1 (*)    Abs Immature Granulocytes 0.19 (*)    All other components within normal limits  URINALYSIS, ROUTINE W REFLEX MICROSCOPIC - Abnormal; Notable for the following components:   Color, Urine STRAW (*)    Glucose, UA >=500 (*)    All other components within normal limits  I-STAT VENOUS BLOOD GAS, ED - Abnormal; Notable for the following components:   pCO2, Ven 43.8 (*)    pO2, Ven 85.0 (*)    Acid-Base Excess 3.0 (*)  Calcium, Ion 1.14 (*)    HCT 15.0 (*)    Hemoglobin 5.1 (*)    All other components within normal limits  POC OCCULT BLOOD, ED - Abnormal; Notable for the following components:   Fecal Occult Bld POSITIVE (*)    All other components within normal limits  TROPONIN I (HIGH SENSITIVITY) - Abnormal; Notable for the following components:   Troponin I (High Sensitivity) 23 (*)    All other components within normal limits  RESP PANEL BY RT-PCR (FLU A&B, COVID) ARPGX2  URINE CULTURE  CULTURE, BLOOD (ROUTINE X 2)  CULTURE, BLOOD (ROUTINE X 2)  CULTURE, BLOOD (SINGLE)  BRAIN NATRIURETIC PEPTIDE  LACTIC ACID, PLASMA  LACTIC ACID, PLASMA  CBC  BASIC METABOLIC PANEL  HEMOGLOBIN AND HEMATOCRIT, BLOOD  HEMOGLOBIN A1C  PREPARE RBC (CROSSMATCH)  TYPE AND SCREEN  TROPONIN I (HIGH SENSITIVITY)    EKG EKG Interpretation  Date/Time:  Friday January 24 2021 17:59:48 EDT Ventricular Rate:  108 PR Interval:    QRS Duration: 162 QT Interval:  448 QTC Calculation: 601 R Axis:   -81 Text Interpretation: Atrial fibrillation RBBB and LAFB Abnormal T, consider ischemia, lateral leads rate is faster, afib new since may 2022 Confirmed by Sherwood Gambler [phone]) on 01/24/2021 6:34:21 PM  Radiology DG Chest Port 1 View  Result Date: 01/24/2021 CLINICAL DATA:  Cough hypoxia EXAM:  PORTABLE CHEST 1 VIEW COMPARISON:  08/29/2020, CT 01/15/2021, 04/30/2020, 09/05/2020 FINDINGS: Post sternotomy changes. Aortic stent graft redemonstrated. Clips over the right axillary region. Similar loculated bilateral pleural effusions and airspace disease at the bases. Cardiomegaly. No pneumothorax. IMPRESSION: Overall no significant change compared with recent prior. Cardiomegaly with similar loculated pleural effusions and basilar airspace disease. Electronically Signed   By: Donavan Foil M.D.   On: 01/24/2021 19:23    Procedures Procedures   Medications Ordered in ED Medications  metoprolol tartrate (LOPRESSOR) tablet 12.5 mg (0 mg Oral Hold 01/24/21 2240)  rosuvastatin (CRESTOR) tablet 5 mg (has no administration in time range)  escitalopram (LEXAPRO) tablet 10 mg (has no administration in time range)  ALPRAZolam (XANAX) tablet 1 mg (1 mg Oral Given 01/24/21 2322)  insulin aspart (novoLOG) injection 0-9 Units (has no administration in time range)  pantoprazole (PROTONIX) injection 40 mg (has no administration in time range)  albuterol (PROVENTIL) (2.5 MG/3ML) 0.083% nebulizer solution 2.5 mg (has no administration in time range)  sodium chloride 0.9 % bolus 500 mL (0 mLs Intravenous Stopped 01/24/21 2232)  0.9 %  sodium chloride infusion (10 mL/hr Intravenous New Bag/Given 01/24/21 2301)  pantoprazole (PROTONIX) injection 40 mg (40 mg Intravenous Given 01/24/21 2139)  cefTRIAXone (ROCEPHIN) 2 g in sodium chloride 0.9 % 100 mL IVPB (0 g Intravenous Stopped 01/24/21 2232)    ED Course  I have reviewed the triage vital signs and the nursing notes.  Pertinent labs & imaging results that were available during my care of the patient were reviewed by me and considered in my medical decision making (see chart for details).  Clinical Course as of 01/24/21 2346  Fri Jan 24, 2021  1922 Hgb 5.1 -he reports dark black stools. This is a likely cause for his hemodynamic instability and  symptoms. Suspect GI in source at this point.  We will perform fecal occult blood test. [ZB]  1957 CBC confirms anemia, hgb 5.0 Which is likely underlying cause of presentation and recent symptoms. I have ordered for type and crossmatch PRBCs. He consents to blood transfusion. [ZB]    Clinical  Course User Index [ZB] Pearson Grippe, DO   MDM Rules/Calculators/A&P                          Patient is a 70 year old male with complicated past medical history including atrial fibrillation anticoagulated on Eliquis, coronary artery disease, COPD, CHF, previous aortic dissection status postrepair in 2018 who presented to the emergency department via EMS with worsening left-sided chest pain (present over the last 5 days worsening about 2 hours prior to arrival), shortness of breath and found to be hypoxic into the mid 80s on his baseline 3 L nasal cannula. Emergency department he was ill-appearing but nontoxic. Borderline hypotensive.  Received 500 of crystalloid by EMS with improvement.  Afebrile on oral check however will check rectal temp. He is on 5 L nasal cannula with oxygen saturations fluctuating in the low to mid 90s. Active cough during my exam.  I am concerned for pneumonia. Chest x-ray ordered. Also highly considering ACS. EKG as above -atrial fibrillation with nonspecific ST-T wave changes. Ordered for cardiac biomarkers as well as BNP.  Please see ED clinical course above for further medical decision making.  ED course: Patient is critically ill for the following reasons: Acute blood loss anemia causing hemorrhagic shock, acute hypoxic respiratory failure likely in the setting of poor perfusion and global ischemia, unstable angina. Impression is likely symptomatic anemia, likely GI loss in the setting of systemic anticoagulation.  No hematemesis. I ordered for a dose of IV Protonix and Rocephin. Patient getting blood transfused. Remained hemodynamically stable after crystalloid  transfusion.  Admitted to medicine for further treatment.  Final Clinical Impression(s) / ED Diagnoses Final diagnoses:  Gastrointestinal hemorrhage, unspecified gastrointestinal hemorrhage type  Hemorrhagic shock ([place])  Acute respiratory failure with hypoxia [facility])    Rx / DC Orders ED Discharge Orders     None        Pearson Grippe, DO 01/24/21 2346    Sherwood Gambler, MD 01/27/21 1537

## 2021-01-25 ENCOUNTER — Encounter (HOSPITAL_COMMUNITY): Payer: Self-pay | Admitting: Internal Medicine

## 2021-01-25 ENCOUNTER — Inpatient Hospital Stay (HOSPITAL_COMMUNITY): Payer: Medicare Other

## 2021-01-25 DIAGNOSIS — D500 Iron deficiency anemia secondary to blood loss (chronic): Secondary | ICD-10-CM

## 2021-01-25 DIAGNOSIS — J9621 Acute and chronic respiratory failure with hypoxia: Secondary | ICD-10-CM

## 2021-01-25 DIAGNOSIS — K922 Gastrointestinal hemorrhage, unspecified: Secondary | ICD-10-CM | POA: Diagnosis not present

## 2021-01-25 LAB — PREPARE RBC (CROSSMATCH)

## 2021-01-25 LAB — GLUCOSE, CAPILLARY: Glucose-Capillary: 113 mg/dL — ABNORMAL HIGH (ref 70–99)

## 2021-01-25 LAB — CBC
HCT: 17.9 % — ABNORMAL LOW (ref 39.0–52.0)
Hemoglobin: 5.6 g/dL — CL (ref 13.0–17.0)
MCH: 31.8 pg (ref 26.0–34.0)
MCHC: 31.3 g/dL (ref 30.0–36.0)
MCV: 101.7 fL — ABNORMAL HIGH (ref 80.0–100.0)
Platelets: 214 10*3/uL (ref 150–400)
RBC: 1.76 MIL/uL — ABNORMAL LOW (ref 4.22–5.81)
RDW: 18.5 % — ABNORMAL HIGH (ref 11.5–15.5)
WBC: 8.2 10*3/uL (ref 4.0–10.5)
nRBC: 0 % (ref 0.0–0.2)

## 2021-01-25 LAB — BASIC METABOLIC PANEL
Anion gap: 10 (ref 5–15)
BUN: 43 mg/dL — ABNORMAL HIGH (ref 8–23)
CO2: 26 mmol/L (ref 22–32)
Calcium: 8.1 mg/dL — ABNORMAL LOW (ref 8.9–10.3)
Chloride: 100 mmol/L (ref 98–111)
Creatinine, Ser: 1.77 mg/dL — ABNORMAL HIGH (ref 0.61–1.24)
GFR, Estimated: 41 mL/min — ABNORMAL LOW (ref >60)
Glucose, Bld: 118 mg/dL — ABNORMAL HIGH (ref 70–99)
Potassium: 3.7 mmol/L (ref 3.5–5.1)
Sodium: 136 mmol/L (ref 135–145)

## 2021-01-25 LAB — HEMOGLOBIN AND HEMATOCRIT, BLOOD
HCT: 22.7 % — ABNORMAL LOW (ref 39.0–52.0)
Hemoglobin: 7.5 g/dL — ABNORMAL LOW (ref 13.0–17.0)

## 2021-01-25 LAB — CBG MONITORING, ED
Glucose-Capillary: 87 mg/dL (ref 70–99)
Glucose-Capillary: 98 mg/dL (ref 70–99)

## 2021-01-25 LAB — LACTIC ACID, PLASMA: Lactic Acid, Venous: 0.7 mmol/L (ref 0.5–1.9)

## 2021-01-25 MED ORDER — PANTOPRAZOLE SODIUM 40 MG IV SOLR
40.0000 mg | Freq: Two times a day (BID) | INTRAVENOUS | Status: DC
  Administered 2021-01-25 – 2021-01-31 (×12): 40 mg via INTRAVENOUS
  Filled 2021-01-25 (×12): qty 40

## 2021-01-25 MED ORDER — FUROSEMIDE 10 MG/ML IJ SOLN
40.0000 mg | Freq: Once | INTRAMUSCULAR | Status: AC
  Administered 2021-01-25: 40 mg via INTRAVENOUS
  Filled 2021-01-25: qty 4

## 2021-01-25 MED ORDER — SODIUM CHLORIDE 0.9 % IV BOLUS
500.0000 mL | Freq: Once | INTRAVENOUS | Status: AC
  Administered 2021-01-25: 500 mL via INTRAVENOUS

## 2021-01-25 MED ORDER — SODIUM CHLORIDE 0.9% IV SOLUTION: Freq: Once | INTRAVENOUS | Status: AC

## 2021-01-25 NOTE — Progress Notes (Addendum)
Progress Note    Sean Riley  JHE:[id number] DOB: [birth year]  DOA: 01/24/2021 PCP: Colon Branch, MD      Brief Narrative:    Medical records reviewed and are as summarized below:  Sean Riley is a 70 y.o. male  with PMH of persistent A. 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  Reportedly, he was hypoxic with oxygen saturation in the mid 80s on 3 L of oxygen.  His hemoglobin was 5 on admission.  He was admitted to the hospital for acute GI bleeding complicated by acute blood loss anemia.  He was transfused with packed red blood cells.  He was treated with IV fluids and IV Protonix.  Gastroenterologist was consulted.    Assessment/Plan:   Active Problems:   Hyperlipidemia   tobacco use d/o   Atrial fibrillation (HCC)   BPH (benign prostatic hyperplasia)   RBBB   HTN (hypertension)   S/P aortic dissection repair   COPD (chronic obstructive pulmonary disease) (HCC)   Atrial tachycardia (HCC)   Symptomatic anemia   Acute on chronic respiratory failure with hypoxia (HCC)   GI bleed   Chest pain   Leukocytosis   CKD (chronic kidney disease)    Body mass index is 26.7 kg/m.   Acute GI bleeding: Treat with IV Protonix.  Eliquis has been held.  Consulted gastroenterologist to assist with management.  Patient requested a diet so he has been started on clear liquid diet.  Acute blood loss symptomatic anemia: S/p transfusion with 3 units of packed red blood cells.  Hemoglobin has improved from 5-7.5.  Transfuse another unit of packed red blood cells.  Monitor H&H closely.  Chest pain: This is likely from severe anemia.  Troponins were normal.  Persistent atrial fibrillation: Eliquis has been  held  COPD with acute on chronic hypoxic respiratory failure: Continue oxygen via nasal cannula.  Continue bronchodilators.  Chronic diastolic CHF: Compensated  Other comorbidities include impaired glucose tolerance, anxiety, depression, hyperlipidemia, CKD stage IIIb, history of aortic dissection s/p bioprosthetic aortic valve   Diet Order             Diet clear liquid Room service appropriate? Yes; Fluid consistency: Thin  Diet effective now                      Consultants: Gastroenterologist  Procedures: None    Medications:    ALPRAZolam  1 mg Oral QHS   escitalopram  10 mg Oral Daily   insulin aspart  0-9 Units Subcutaneous TID WC   metoprolol tartrate  12.5 mg Oral BID   [DISCONTINUED] pantoprazole (PROTONIX) IV  40 mg Intravenous Q24H   pantoprazole (PROTONIX) IV  40 mg Intravenous Q12H   rosuvastatin  5 mg Oral Daily   Continuous Infusions:   Anti-infectives (From admission, onward)    Start     Dose/Rate Route Frequency Ordered Stop   01/24/21 2100  cefTRIAXone (ROCEPHIN) 2 g in sodium chloride 0.9 % 100 mL IVPB        2 g 200 mL/hr over 30 Minutes Intravenous  Once 01/24/21 2057 01/24/21 2232              Family Communication/Anticipated D/C date and plan/Code Status  DVT prophylaxis: SCDs Start: 01/24/21 2235     Code Status: Full Code  Family Communication: Plan discussed with his wife at the bedside Disposition Plan:    Status is: Inpatient  Remains inpatient appropriate because:IV treatments appropriate due to intensity of illness or inability to take PO and Inpatient level of care appropriate due to severity of illness  Dispo: The patient is from: Home              Anticipated d/c is to: Home              Patient currently is not medically stable to d/c.   Difficult to place patient No           Subjective:   C/o dizziness.  He is a little short of breath.  He uses home oxygen.  He said his last bowel  movement was on Monday, 02/06/2021.  He said he has been constipated but his last stools were black.  No hematemesis or abdominal pain.  Objective:    Vitals:   01/25/21 0854 01/25/21 0916 01/25/21 1100 01/25/21 1400  BP: (!) 115/49 (!) 125/56 129/62 137/68  Pulse: 77 75 65 79  Resp: 19 (!) 23 (!) 26 (!) 29  Temp: 97.8 F (36.6 C) (!) 97.5 F (36.4 C) 97.8 F (36.6 C)   TempSrc: Oral Oral Oral   SpO2: 96% 99% 98% 92%  Weight:      Height:       No data found.   Intake/Output Summary (Last 24 hours) at 01/25/2021 1431 Last data filed at 01/25/2021 1100 Gross per 24 hour  Intake 2081.72 ml  Output 1300 ml  Net 781.72 ml   Filed Weights   01/24/21 1805  Weight: 89.3 kg    Exam:  GEN: NAD SKIN: No rash EYES: EOMI ENT: MMM CV: RRR PULM: CTA B ABD: soft, ND, NT, +BS CNS: AAO x 3, non focal EXT: No edema or tenderness        Data Reviewed:   I have personally reviewed following labs and imaging studies:  Labs: Labs show the following:   Basic Metabolic Panel: Recent Labs  Lab 01/24/21 1829 01/24/21 1845 01/25/21 0245  NA 135 136 136  K 3.6 3.6 3.7  CL 99  --  100  CO2 26  --  26  GLUCOSE 131*  --  118*  BUN 47*  --  43*  CREATININE 1.82*  --  1.77*  CALCIUM 8.5*  --  8.1*   GFR Estimated Creatinine Clearance: 42 mL/min (A) (by C-G formula based on SCr of 1.77 mg/dL (H)). Liver Function Tests: Recent Labs  Lab 01/24/21 1829  AST 17  ALT 15  ALKPHOS 64  BILITOT 0.7  PROT 5.9*  ALBUMIN 3.1*   No results for input(s): LIPASE, AMYLASE in the last 168 hours. No results for input(s): AMMONIA in the last 168 hours. Coagulation profile No results for input(s): INR, PROTIME in the last 168 hours.  CBC: Recent Labs  Lab 01/24/21 1829 01/24/21 1845 01/25/21 0245 01/25/21 1324  WBC 11.4*  --  8.2  --   NEUTROABS 9.3*  --   --   --   HGB 5.0* 5.1* 5.6* 7.5*  HCT 16.2* 15.0* 17.9* 22.7*  MCV 103.8*  --  101.7*  --   PLT 232  --  214  --     Cardiac Enzymes: No results for input(s): CKTOTAL, CKMB, CKMBINDEX, TROPONINI in the last 168 hours. BNP (last 3  results) No results for input(s): PROBNP in the last 8760 hours. CBG: Recent Labs  Lab 01/25/21 0848 01/25/21 1236  GLUCAP 87 98   D-Dimer: No results for input(s): DDIMER in the last 72 hours. Hgb A1c: No results for input(s): HGBA1C in the last 72 hours. Lipid Profile: No results for input(s): CHOL, HDL, LDLCALC, TRIG, CHOLHDL, LDLDIRECT in the last 72 hours. Thyroid function studies: No results for input(s): TSH, T4TOTAL, T3FREE, THYROIDAB in the last 72 hours.  Invalid input(s): FREET3 Anemia work up: No results for input(s): VITAMINB12, FOLATE, FERRITIN, TIBC, IRON, RETICCTPCT in the last 72 hours. Sepsis Labs: Recent Labs  Lab 01/24/21 1829 01/25/21 0245  WBC 11.4* 8.2  LATICACIDVEN 1.6 0.7    Microbiology Recent Results (from the past 240 hour(s))  Resp Panel by RT-PCR (Flu A&B, Covid) Nasopharyngeal Swab     Status: None   Collection Time: 01/24/21  6:29 PM   Specimen: Nasopharyngeal Swab; Nasopharyngeal(NP) swabs in vial transport medium  Result Value Ref Range Status   SARS Coronavirus 2 by RT PCR NEGATIVE NEGATIVE Final    Comment: (NOTE) SARS-CoV-2 target nucleic acids are NOT DETECTED.  The SARS-CoV-2 RNA is generally detectable in upper respiratory specimens during the acute phase of infection. The lowest concentration of SARS-CoV-2 viral copies this assay can detect is 138 copies/mL. A negative result does not preclude SARS-Cov-2 infection and should not be used as the sole basis for treatment or other patient management decisions. A negative result may occur with  improper specimen collection/handling, submission of specimen other than nasopharyngeal swab, presence of viral mutation(s) within the areas targeted by this assay, and inadequate number of viral copies(<138 copies/mL). A negative result must be combined with clinical  observations, patient history, and epidemiological information. The expected result is Negative.  Fact Sheet for Patients:  EntrepreneurPulse.com.au  Fact Sheet for Healthcare Providers:  IncredibleEmployment.be  This test is no t yet approved or cleared by the [place] FDA and  has been authorized for detection and/or diagnosis of SARS-CoV-2 by FDA under an Emergency Use Authorization (EUA). This EUA will remain  in effect (meaning this test can be used) for the duration of the COVID-19 declaration under Section 564(b)(1) of the Act, 21 U.S.C.section 360bbb-3(b)(1), unless the authorization is terminated  or revoked sooner.       Influenza A by PCR NEGATIVE NEGATIVE Final   Influenza B by PCR NEGATIVE NEGATIVE Final    Comment: (NOTE) The Xpert Xpress SARS-CoV-2/FLU/RSV plus assay is intended as an aid in the diagnosis of influenza from Nasopharyngeal swab specimens and should not be used as a sole basis for treatment. Nasal washings and aspirates are unacceptable for Xpert Xpress SARS-CoV-2/FLU/RSV testing.  Fact Sheet for Patients: EntrepreneurPulse.com.au  Fact Sheet for Healthcare Providers: IncredibleEmployment.be  This test is not yet approved or cleared by the [place] FDA and has been authorized for detection and/or diagnosis of SARS-CoV-2 by FDA under an Emergency Use Authorization (EUA). This EUA will remain in effect (meaning this test can be used) for the duration of the COVID-19 declaration under Section 564(b)(1) of the Act, 21 U.S.C. section 360bbb-3(b)(1), unless the authorization is terminated or revoked.  Performed at [facility], [place] [address], [place], [place] [address]     Procedures and diagnostic studies:  DG Chest Port 1 View  Result Date: 01/24/2021 CLINICAL DATA:  Cough hypoxia EXAM: PORTABLE CHEST 1 VIEW COMPARISON:  08/29/2020, CT 01/15/2021,  04/30/2020, 09/05/2020 FINDINGS: Post sternotomy changes. Aortic stent  graft redemonstrated. Clips over the right axillary region. Similar loculated bilateral pleural effusions and airspace disease at the bases. Cardiomegaly. No pneumothorax. IMPRESSION: Overall no significant change compared with recent prior. Cardiomegaly with similar loculated pleural effusions and basilar airspace disease. Electronically Signed   By: Donavan Foil M.D.   On: 01/24/2021 19:23               LOS: 1 day   Keghan Mcfarren  [facility]   Pager on [email or web address]. If 7PM-7AM, please contact night-coverage at [email or web address]     01/25/2021, 2:31 PM

## 2021-01-25 NOTE — ED Notes (Signed)
Lunch tray ordered 

## 2021-01-25 NOTE — ED Notes (Signed)
GI at bedside, lunch tray delivered.

## 2021-01-25 NOTE — Consult Note (Addendum)
Referring Provider: Dr. Mal Misty  Primary Care Physician:  Colon Branch, MD Primary Gastroenterologist:  Dr. Hilarie Fredrickson  Reason for Consultation:  Anemia, melena  HPI: Sean Riley is a 70 y.o. 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  He developed generalized weakness 1 week ago.  He had difficulty walking, felt wobbly for the past 3 to 4 days.  His wife took took his blood pressure which she said was low and his heart rate was 110 -111 b/min. He developed chest pain which radiated across his chest with shortness of breath on Friday, 01/24/2021.  He presented to the ED for further evaluation.  Labs in the ED showed a hemoglobin level of 5.0.  FOBT was positive.  He was transfused 3 units of PRBCs.  Posttransfusion hemoglobin level is pending.   Currently, he denies having any chest pain or shortness of breath.  He continues to feel profoundly weak.  He takes Eliquis for atrial fibrillation, his last dose was taken at 10 AM on 6/17.  He reported passing a black solid stool on Monday/13 without recurrence.  No bowel movement since then.  That was the first time he ever passed a black-colored stool.  No rectal bleeding.  No prior history of GI bleed.  No NSAID use.  No alcohol use.  Prior history of GERD symptoms for which she takes Nexium 40 mg once daily.  He denies having any heartburn or dysphagia.  He has a history of colon polyps.  His most recent colonoscopy was 05/15/2019 completed by Dr. Hilarie Fredrickson, 15 precancerous polyps measuring 3 to 15 mm were removed throughout the colon.  He was advised to repeat a colonoscopy in 3 years which was not done as he was recovering from his a sending aortic dissection surgery.  No known family history of esophageal, gastric or colon cancer.  Colonoscopy 05/14/2016: -  Two 3 to 4 mm polyps in the ascending colon, removed with a cold snare. Resected and retrieved. - Two 4 to 7 mm polyps in the transverse colon, removed with a cold snare. Resected and retrieved. - One 15 mm polyp in the transverse colon, removed with a hot snare. Resected and retrieved. - Post-polypectomy scar in the transverse colon. Biopsied. - Two 5 to 7 mm polyps in the descending colon, removed with a cold snare. Resected and retrieved. - One 8 mm polyp in the sigmoid colon, removed with a hot snare. Resected and retrieved. - Three 5 to 7 mm polyps in the sigmoid colon, removed with a cold snare. Resected and retrieved. - One 7 mm polyp at the recto-sigmoid colon, removed with a hot snare. Resected and retrieved. - Three 2 to 4 mm polyps in the rectum, removed with a cold biopsy forceps. Resected and retrieved. - Moderate diverticulosis in the sigmoid colon and in the descending colon. - Internal hemorrhoids. 1. Surgical [P], ascending, polyp (2) - HYPERPLASTIC POLYP, ONE FRAGMENT. - BENIGN COLORECTAL MUCOSA, ONE FRAGMENT. - NO DYSPLASIA OR MALIGNANCY IDENTIFIED. 2. Surgical [P], transverse, polyp (2) - BENIGN COLORECTAL MUCOSA, TWO FRAGMENTS. - NO DYSPLASIA OR MALIGNANCY IDENTIFIED. 3. Surgical [P], transverse, polyp (2) - SESSILE SERRATED POLYP / ADENOMA WITHOUT CYTOLOGIC DYSPLASIA, 1 CM. - HYPERPLASTIC POLYP WITHOUT DYSPLASIA, ADDITIONAL FRAGMENT. 4. Surgical [P], descending, sigmoid, polyp (6) - TUBULAR ADENOMA, ONE FRAGMENT. - FRAGMENTS OF  HYPERPLASTIC POLYP AND BENIGN COLORECTAL MUCOSA, REMAINING TISSUE. - NO HIGH GRADE DYSPLASIA OR MALIGNANCY IDENTIFIED. 5. Surgical [P], rectosigmoid, rectum, polyp (4) - TUBULAR ADENOMA, ONE FRAGMENT. - FRAGMENTS OF HYPERPLASTIC POLYP. - NO HIGH GRADE DYSPLASIA OR MALIGNANCY IDENTIFIED  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[employer]) 03/23/2017   Atrial fibrillation (HCC)     Cataract    removed both eyes    CHF (congestive heart failure) (HCC)    GERD (gastroesophageal reflux disease)    HYPERLIPIDEMIA    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 LUTS    Hypertension    Microscopic hematuria    negative cystoscopy   NONSPECIFIC ABNORMAL ELECTROCARDIOGRAM    Pleural effusion on right    Pleural effusion, bilateral    S/P aortic dissection repair 03/24/2017   Biological Bentall aortic root replacement + resection and grafting of entire ascending aorta, transverse aortic arch and proximal descending thoracic aorta with elephant trunk distal anastomosis and debranching of aortic arch vessels   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   25 mm [facility] Ease bovine pericardial tissue valve and 28 mm Gelweave Valsalva aortic root graft with reimplantation of left main and right coronary arteries   Varicose veins     Past Surgical History:  Procedure Laterality Date   APPENDECTOMY     CARDIAC CATHETERIZATION      X3;Dr Caryl Comes, last 01/20/12: mild non-obstructive CAD, normal LV systolic function   CARDIOVERSION N/A 05/31/2018   Procedure: CARDIOVERSION;  Surgeon: Sueanne Margarita, MD;  Location: [facility] ENDOSCOPY;  Service: Cardiovascular;  Laterality: N/A;   CARDIOVERSION N/A 06/13/2020   Procedure: CARDIOVERSION;  Surgeon: Larey Dresser, MD;  Location: [facility] ENDOSCOPY;  Service: Cardiovascular;  Laterality: N/A;   COLONOSCOPY     CYSTOSCOPY  1978   Dr Hartley Barefoot   EYE SURGERY     IR THORACENTESIS ASP PLEURAL SPACE W/IMG GUIDE  11/25/2017   KNEE ARTHROSCOPY  2012   Dr Theda Sers   LUMBAR LAMINECTOMY  1987   Dr Lestine Box  2007   PERICARDIOCENTESIS N/A 04/07/2017   Procedure: PERICARDIOCENTESIS;  Surgeon: Sherren Mocha, MD;  Location: [place] CV LAB;  Service: Cardiovascular;  Laterality: N/A;   POLYPECTOMY     PRESSURE SENSOR/CARDIOMEMS N/A 12/26/2020   Procedure: PRESSURE SENSOR/CARDIOMEMS;  Surgeon: Larey Dresser,  MD;  Location: [place] CV LAB;  Service: Cardiovascular;  Laterality: N/A;   REPAIR OF ACUTE ASCENDING THORACIC AORTIC DISSECTION N/A 03/23/2017   Procedure: REPAIR OF ACUTE ASCENDING THORACIC AORTIC DISSECTION.  Bentall procedure.  Aortic root repleacement with bioprosthetic valve.  Reimplantation of left and right coronary arteries.  Total resection of transverse aortic arch.  Elephant trunk distal anastomosis and debranching of arch vessels.;  Surgeon: Rexene Alberts, MD;  Location: [place];  Service: Vascular;  Laterality: N/A;   RIGHT HEART CATH N/A 06/15/2019   Procedure: RIGHT HEART CATH;  Surgeon: Larey Dresser, MD;  Location: [place] CV LAB;  Service: Cardiovascular;  Laterality: N/A;   ROTATOR CUFF REPAIR     SHOULDER ARTHROSCOPY  2011   Dr Theda Sers   SHOULDER ARTHROSCOPY Right 08/2015   SPINAL FUSION  1986   Dr Rolin Barry   TEE WITHOUT CARDIOVERSION N/A 03/23/2017   Procedure: TRANSESOPHAGEAL ECHOCARDIOGRAM (TEE);  Surgeon: Rexene Alberts, MD;  Location: [facility];  Service: Open Heart Surgery;  Laterality: N/A;   THORACIC  AORTIC ENDOVASCULAR STENT GRAFT N/A 03/30/2018   Procedure: THORACIC AORTIC ENDOVASCULAR STENT GRAFT WITH INTRAVASCULAR ULTRASOUND;  Surgeon: Serafina Mitchell, MD;  Location: [facility] OR;  Service: Vascular;  Laterality: N/A;   TRACHEOSTOMY     age 68 for croup    Prior to Admission medications   Medication Sig Start Date End Date Taking? Authorizing Provider  albuterol (PROAIR HFA) 108 (90 Base) MCG/ACT inhaler Inhale 2 puffs into the lungs every 4 (four) hours as needed for wheezing or shortness of breath. 03/30/19  Yes Paz, Alda Berthold, MD  ALPRAZolam Duanne Moron) 1 MG tablet TAKE 1-1&1/2 TABLETS BY MOUTH AT BEDTIME Patient taking differently: Take 1 mg by mouth at bedtime. 10/30/20  Yes Paz, Alda Berthold, MD  apixaban (ELIQUIS) 5 MG TABS tablet Take 1 tablet (5 mg total) by mouth 2 (two) times daily for 1 day. 12/26/20 01/24/21 Yes Larey Dresser, MD  augmented betamethasone  dipropionate (DIPROLENE-AF) 0.05 % cream Apply topically 2 (two) times daily as needed. Patient taking differently: Apply 1 application topically 2 (two) times daily as needed (back rash). 12/25/19  Yes Paz, Alda Berthold, MD  bisacodyl (DULCOLAX) 5 MG EC tablet Take 5 mg by mouth daily as needed for moderate constipation.   Yes [provider]  diphenhydrAMINE (BENADRYL) 25 MG tablet Take 25 mg by mouth daily as needed (allergies/runny nose).   Yes [provider]  empagliflozin (JARDIANCE) 10 MG TABS tablet Take 1 tablet (10 mg total) by mouth daily before breakfast. 12/04/20  Yes Larey Dresser, MD  escitalopram (LEXAPRO) 10 MG tablet TAKE 1 TABLET DAILY Patient taking differently: Take 10 mg by mouth in the morning. 09/19/20  Yes Colon Branch, MD  esomeprazole (NEXIUM) 40 MG capsule Take 1 capsule (40 mg total) by mouth daily before breakfast. 01/05/17  Yes Kozlow, Donnamarie Poag, MD  flecainide (TAMBOCOR) 50 MG tablet TAKE ONE AND ONE-HALF TABLETS TWICE A DAY Patient taking differently: Take 75 mg by mouth 2 (two) times daily. 11/29/20  Yes Deboraha Sprang, MD  fluticasone [facility]) 50 MCG/ACT nasal spray Place 1 spray into both nostrils 2 (two) times daily as needed for allergies. Use one spray in each nostril twice daily. Patient taking differently: Place 1 spray into both nostrils 2 (two) times daily as needed for allergies. 05/31/18  Yes Turner, Eber Hong, MD  hydroxypropyl methylcellulose / hypromellose (ISOPTO TEARS / GONIOVISC) 2.5 % ophthalmic solution Place 1-2 drops into both eyes 3 (three) times daily as needed (dry/irritated eyes).   Yes [provider]  ipratropium-albuterol (DUONEB) 0.5-2.5 (3) MG/3ML SOLN Take 3 mLs by nebulization 4 (four) times daily. 06/11/20  Yes Mannam, Praveen, MD  KLOR-CON M20 20 MEQ tablet TAKE 2 TABLETS (40 MEQ) DAILY Patient taking differently: Take 40 mEq by mouth at bedtime. 05/17/20  Yes Larey Dresser, MD  losartan (COZAAR) 25 MG tablet Take 1  tablet (25 mg total) by mouth daily. 11/18/20  Yes Paz, Alda Berthold, MD  magnesium hydroxide (MILK OF MAGNESIA) 400 MG/5ML suspension Take 7.5 mLs by mouth daily as needed (stomach cramps/constipation).   Yes [provider]  metoprolol tartrate (LOPRESSOR) 25 MG tablet Take 0.5 tablets (12.5 mg total) by mouth 2 (two) times daily. Please make yearly appt with Dr. Caryl Comes for January 2022 for future refills. Thank you 1st attempt 09/08/20  Yes Shelly Coss, MD  OXYGEN Inhale 3-4 L/min into the lungs continuous.   Yes [provider]  rosuvastatin (CRESTOR) 5 MG tablet Take 1  tablet (5 mg total) by mouth daily. Patient taking differently: Take 5 mg by mouth at bedtime. 11/29/20  Yes Paz, Alda Berthold, MD  torsemide (DEMADEX) 20 MG tablet Take 3 tablets (60 mg total) by mouth daily. 12/26/20  Yes Larey Dresser, MD  albuterol (PROVENTIL) (2.5 MG/3ML) 0.083% nebulizer solution Take 3 mLs (2.5 mg total) by nebulization every 8 (eight) hours as needed for wheezing or shortness of breath. Patient not taking: No sig reported 04/30/20   Colon Branch, MD  aspirin (ASPIRIN CHILDRENS) 81 MG chewable tablet Chew 1 tablet (81 mg total) by mouth daily. Patient not taking: No sig reported 12/26/20 01/25/21  Larey Dresser, MD  tamoxifen (NOLVADEX) 10 MG tablet TAKE 1 TABLET TWICE A DAY Patient not taking: No sig reported 11/29/20   Renato Shin, MD    Current Facility-Administered Medications  Medication Dose Route Frequency Provider Last Rate Last Admin   albuterol (PROVENTIL) (2.5 MG/3ML) 0.083% nebulizer solution 2.5 mg  2.5 mg Nebulization Q4H PRN Nicolette Bang, MD       ALPRAZolam Duanne Moron) tablet 1 mg  1 mg Oral QHS Nicolette Bang, MD   1 mg at 01/24/21 2322   escitalopram (LEXAPRO) tablet 10 mg  10 mg Oral Daily Nicolette Bang, MD   10 mg at 01/25/21 0902   insulin aspart (novoLOG) injection 0-9 Units  0-9 Units Subcutaneous TID WC Nicolette Bang, MD        metoprolol tartrate (LOPRESSOR) tablet 12.5 mg  12.5 mg Oral BID Nicolette Bang, MD   12.5 mg at 01/25/21 0902   pantoprazole (PROTONIX) injection 40 mg  40 mg Intravenous Q24H Nicolette Bang, MD   40 mg at 01/25/21 0851   rosuvastatin (CRESTOR) tablet 5 mg  5 mg Oral Daily Nicolette Bang, MD   5 mg at 01/25/21 1962   Current Outpatient Medications  Medication Sig Dispense Refill   albuterol (PROAIR HFA) 108 (90 Base) MCG/ACT inhaler Inhale 2 puffs into the lungs every 4 (four) hours as needed for wheezing or shortness of breath. 54 g 3   ALPRAZolam (XANAX) 1 MG tablet TAKE 1-1&1/2 TABLETS BY MOUTH AT BEDTIME (Patient taking differently: Take 1 mg by mouth at bedtime.) 40 tablet 2   apixaban (ELIQUIS) 5 MG TABS tablet Take 1 tablet (5 mg total) by mouth 2 (two) times daily for 1 day. 180 tablet 2   augmented betamethasone dipropionate (DIPROLENE-AF) 0.05 % cream Apply topically 2 (two) times daily as needed. (Patient taking differently: Apply 1 application topically 2 (two) times daily as needed (back rash).) 50 g 1   bisacodyl (DULCOLAX) 5 MG EC tablet Take 5 mg by mouth daily as needed for moderate constipation.     diphenhydrAMINE (BENADRYL) 25 MG tablet Take 25 mg by mouth daily as needed (allergies/runny nose).     empagliflozin (JARDIANCE) 10 MG TABS tablet Take 1 tablet (10 mg total) by mouth daily before breakfast. 90 tablet 0   escitalopram (LEXAPRO) 10 MG tablet TAKE 1 TABLET DAILY (Patient taking differently: Take 10 mg by mouth in the morning.) 90 tablet 3   esomeprazole (NEXIUM) 40 MG capsule Take 1 capsule (40 mg total) by mouth daily before breakfast. 90 capsule 3   flecainide (TAMBOCOR) 50 MG tablet TAKE ONE AND ONE-HALF TABLETS TWICE A DAY (Patient taking differently: Take 75 mg by mouth 2 (two) times daily.) 270 tablet 2   fluticasone (FLONASE) 50 MCG/ACT nasal spray Place 1 spray into  both nostrils 2 (two) times daily as needed for allergies. Use  one spray in each nostril twice daily. (Patient taking differently: Place 1 spray into both nostrils 2 (two) times daily as needed for allergies.)     hydroxypropyl methylcellulose / hypromellose (ISOPTO TEARS / GONIOVISC) 2.5 % ophthalmic solution Place 1-2 drops into both eyes 3 (three) times daily as needed (dry/irritated eyes).     ipratropium-albuterol (DUONEB) 0.5-2.5 (3) MG/3ML SOLN Take 3 mLs by nebulization 4 (four) times daily. 360 mL 5   KLOR-CON M20 20 MEQ tablet TAKE 2 TABLETS (40 MEQ) DAILY (Patient taking differently: Take 40 mEq by mouth at bedtime.) 180 tablet 3   losartan (COZAAR) 25 MG tablet Take 1 tablet (25 mg total) by mouth daily. 90 tablet 1   magnesium hydroxide (MILK OF MAGNESIA) 400 MG/5ML suspension Take 7.5 mLs by mouth daily as needed (stomach cramps/constipation).     metoprolol tartrate (LOPRESSOR) 25 MG tablet Take 0.5 tablets (12.5 mg total) by mouth 2 (two) times daily. Please make yearly appt with Dr. Caryl Comes for January 2022 for future refills. Thank you 1st attempt 180 tablet 1   OXYGEN Inhale 3-4 L/min into the lungs continuous.     rosuvastatin (CRESTOR) 5 MG tablet Take 1 tablet (5 mg total) by mouth daily. (Patient taking differently: Take 5 mg by mouth at bedtime.) 90 tablet 1   torsemide (DEMADEX) 20 MG tablet Take 3 tablets (60 mg total) by mouth daily. 270 tablet 3   albuterol (PROVENTIL) (2.5 MG/3ML) 0.083% nebulizer solution Take 3 mLs (2.5 mg total) by nebulization every 8 (eight) hours as needed for wheezing or shortness of breath. (Patient not taking: No sig reported) 150 mL 1   aspirin (ASPIRIN CHILDRENS) 81 MG chewable tablet Chew 1 tablet (81 mg total) by mouth daily. (Patient not taking: No sig reported) 30 tablet 0   tamoxifen (NOLVADEX) 10 MG tablet TAKE 1 TABLET TWICE A DAY (Patient not taking: No sig reported) 180 tablet 0    Allergies as of 01/24/2021 - Review Complete 01/24/2021  Allergen Reaction Noted   Simvastatin Other (See Comments)     Celecoxib Other (See Comments)    Codeine Other (See Comments)    Nsaids Other (See Comments)    Tape Other (See Comments) 03/23/2017   Cephalexin Itching and Rash    Latex Rash 03/23/2017    Family History  Problem Relation Age of Onset   Hypertension Mother    Hypertension Father    Benign prostatic hyperplasia Father        S/P TURP   Heart attack Maternal Grandmother        MI in 30s   Breast cancer Maternal Grandmother    Arrhythmia Brother         X 2   Heart attack Maternal Aunt        MI in 73s   Stroke Neg Hx    Diabetes Neg Hx    Colon cancer Neg Hx    Colon polyps Neg Hx    Stomach cancer Neg Hx    Rectal cancer Neg Hx    Esophageal cancer Neg Hx    Prostate cancer Neg Hx    Other Neg Hx        gynecomastia    Social History   Socioeconomic History   Marital status: Married    Spouse name: Not on file   Number of children: 2   Years of education: Not on file   Highest  education level: Not on file  Occupational History   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   Tobacco Use   Smoking status: Former    Packs/day: 2.00    Years: 45.00    Pack years: 90.00    Types: Cigarettes    Quit date: 03/22/2017    Years since quitting: 3.8   Smokeless tobacco: Never   Tobacco comments:    2 ppd , quit 03/2017  Vaping Use   Vaping Use: Never used  Substance and Sexual Activity   Alcohol use: Not Currently    Alcohol/week: 0.0 standard drinks    Comment: 5 cans of beer daily   Drug use: No   Sexual activity: Never  Other Topics Concern   Not on file  Social History Narrative   Lives w/ wife   Social Determinants of Health   Financial Resource Strain: Not on file  Food Insecurity: Not on file  Transportation Needs: Not on file  Physical Activity: Not on file  Stress: Not on file  Social Connections: Not on file  Intimate Partner Violence: Not on file    Review of Systems: See HPI, all other systems reviewed and are negative Physical  Exam:  Vital signs in last 24 hours: Temp:  [97.5 F (36.4 C)-98.9 F (37.2 C)] 97.8 F (36.6 C) (06/18 1100) Pulse Rate:  [65-106] 65 (06/18 1100) Resp:  [17-29] 26 (06/18 1100) BP: (91-129)/(34-75) 129/62 (06/18 1100) SpO2:  [86 %-100 %] 98 % (06/18 1100) Weight:  [89.3 kg] 89.3 kg (06/17 1805)   General:  Alert fatigued appearing 70 year old male in no acute distress. Head:  Normocephalic and atraumatic. Eyes:  No scleral icterus. Conjunctiva pink. Ears:  Normal auditory acuity. Nose:  No deformity, discharge or lesions. Mouth:  Dentition intact. No ulcers or lesions.  Neck:  Supple. No lymphadenopathy or thyromegaly.  Lungs: Coarse breath sounds, few scattered expiratory wheezes, decreased in the bases. Heart: Regular rhythm, systolic murmur. Abdomen: Protuberant, soft.  Nontender.  Positive bowel sounds all 4 quadrants. Rectal: Deferred.  FOBT positive. Musculoskeletal:  Symmetrical without gross deformities.  Pulses:  Normal pulses noted. Extremities:  Without clubbing or edema. Neurologic:  Alert and  oriented x4. No focal deficits.  Skin:  Intact without significant lesions or rashes. Psych:  Alert and cooperative. Normal mood and affect.  Intake/Output from previous day: 06/17 0701 - 06/18 0700 In: 1361.7 [I.V.:15; Blood:730; IV Piggyback:616.7] Out: -  Intake/Output this shift: Total I/O In: 720 [Blood:720] Out: 1300 [Urine:1300]  Lab Results: Recent Labs    01/24/21 1829 01/24/21 1845 01/25/21 0245  WBC 11.4*  --  8.2  HGB 5.0* 5.1* 5.6*  HCT 16.2* 15.0* 17.9*  PLT 232  --  214   BMET Recent Labs    01/24/21 1829 01/24/21 1845 01/25/21 0245  NA 135 136 136  K 3.6 3.6 3.7  CL 99  --  100  CO2 26  --  26  GLUCOSE 131*  --  118*  BUN 47*  --  43*  CREATININE 1.82*  --  1.77*  CALCIUM 8.5*  --  8.1*   LFT Recent Labs    01/24/21 1829  PROT 5.9*  ALBUMIN 3.1*  AST 17  ALT 15  ALKPHOS 64  BILITOT 0.7   PT/INR No results for input(s):  LABPROT, INR in the last 72 hours. Hepatitis Panel No results for input(s): HEPBSAG, HCVAB, HEPAIGM, HEPBIGM in the last 72 hours.    Studies/Results: DG Chest Port 1 [address]  Result Date: 01/24/2021 CLINICAL DATA:  Cough hypoxia EXAM: PORTABLE CHEST 1 VIEW COMPARISON:  08/29/2020, CT 01/15/2021, 04/30/2020, 09/05/2020 FINDINGS: Post sternotomy changes. Aortic stent graft redemonstrated. Clips over the right axillary region. Similar loculated bilateral pleural effusions and airspace disease at the bases. Cardiomegaly. No pneumothorax. IMPRESSION: Overall no significant change compared with recent prior. Cardiomegaly with similar loculated pleural effusions and basilar airspace disease. Electronically Signed   By: Donavan Foil M.D.   On: 01/24/2021 19:23    IMPRESSION/PLAN:  56.  70 year old male admitted to the hospital with generalized weakness, chest pain and shortness of breath with anemia.  He reported passing a solid black stool x 1 episode 5 days ago. Admission hemoglobin 5.0 (base line Hg 12.6 on 12/26/2020).  BUN 43 (base line 24). Transfused 3 units of PRBCs, post transfusion H&H pending. -Await posttransfusion H&H result -H&H every 6 hours x24 hours -Transfuse for hemoglobin less than 7 -PPI IV twice daily -EGD/colonoscopy when cardiopulmonary status stable and after Eliquis washout -Further recommendations per Dr. Tarri Glenn   2.  History of atrial fibrillation on Eliquis.  Last dose of Eliquis was taken at 10 AM on 6/17. -Hold Eliquis  3.  History of numerous TA/SS/HP colon polyps, 15 polyps removed at the time of his colonoscopy 05/2016 -See plan in # 1  4. COPD on home oxygen  5. AKI on CKD      Noralyn Pick  01/25/2021, 1:38 PM

## 2021-01-25 NOTE — Progress Notes (Signed)
   01/25/21 2109  Vitals  Temp 97.9 F (36.6 C)  Temp Source Oral  BP (!) 118/55  MAP (mmHg) 73  BP Location Right Arm  BP Method Automatic  Patient Position (if appropriate) Sitting  Pulse Rate 79  Pulse Rate Source Dinamap  Resp (!) 24  Level of Consciousness  Level of Consciousness Alert  MEWS COLOR  MEWS Score Color Green  Oxygen Therapy  SpO2 96 %  O2 Device Nasal Cannula  O2 Flow Rate (L/min) 3.5 L/min  Patient Activity (if Appropriate) In bed  Pulse Oximetry Type Intermittent  Pain Assessment  Pain Scale 0-10  Pain Score 5  Pain Type Acute pain  Pain Location Chest  Pain Orientation Mid  Pain Descriptors / Indicators Pressure  Pain Onset Sudden  Patients Stated Pain Goal 0  Pain Intervention(s) Emotional support  MEWS Score  MEWS Temp 0  MEWS Systolic 0  MEWS Pulse 0  MEWS RR 1  MEWS LOC 0  MEWS Score 1   Patient currently had blood infusing for approximately 2 hours.  He is c/o increasing shortness of breath, chest pressure 5/10, and anxiety.  Upon assessment, he appears anxious and his breathing is labored at rest and RR is 24.  Breath sounds are decreased.  He has history of COPD and CHF.  He is chronically on O2 at home.  He is currently on 3.5 liters.  He states at home, when he gets short of breath, he increases to 4 liters.  Dr. Myna Hidalgo made aware.  Blood stopped and stat portable chest xray ordered.  Dr. Myna Hidalgo came to room to assess patient and after looking at the chest xray, he ordered 40 mg IV Lasix and for blood to be finished.  Lasix given and blood finished.  Breathing improved with good urine output.  VS remain stable.  Will continue to monitor patient.  Earleen Reaper RN

## 2021-01-25 NOTE — Progress Notes (Signed)
Cross-coverage note:   Patient seen for SOB that developed while receiving a 4th unit of RBCs for GI bleed with symptomatic anemia.   Transfusion was paused and CXR is being obtained.   He is afebrile, has mild tachypnea, not in acute distress.   Plan to treat with Lasix 40 mg IV and resume transfusion.

## 2021-01-25 NOTE — ED Notes (Signed)
Checked patient cbg it was 53 notified the RN of blood sugar patient is resting with family at bedside and call bell in reach

## 2021-01-26 DIAGNOSIS — N390 Urinary tract infection, site not specified: Secondary | ICD-10-CM

## 2021-01-26 DIAGNOSIS — I4819 Other persistent atrial fibrillation: Secondary | ICD-10-CM

## 2021-01-26 DIAGNOSIS — B9629 Other Escherichia coli [E. coli] as the cause of diseases classified elsewhere: Secondary | ICD-10-CM

## 2021-01-26 DIAGNOSIS — D649 Anemia, unspecified: Secondary | ICD-10-CM

## 2021-01-26 DIAGNOSIS — J9601 Acute respiratory failure with hypoxia: Secondary | ICD-10-CM

## 2021-01-26 DIAGNOSIS — I5032 Chronic diastolic (congestive) heart failure: Secondary | ICD-10-CM

## 2021-01-26 DIAGNOSIS — I480 Paroxysmal atrial fibrillation: Secondary | ICD-10-CM

## 2021-01-26 DIAGNOSIS — Z1612 Extended spectrum beta lactamase (ESBL) resistance: Secondary | ICD-10-CM

## 2021-01-26 DIAGNOSIS — R195 Other fecal abnormalities: Secondary | ICD-10-CM

## 2021-01-26 DIAGNOSIS — E7800 Pure hypercholesterolemia, unspecified: Secondary | ICD-10-CM

## 2021-01-26 LAB — BPAM RBC
Blood Product Expiration Date: 202206232359
Blood Product Expiration Date: 202206232359
Blood Product Expiration Date: 202206232359
Blood Product Expiration Date: 202206232359
Blood Product Expiration Date: 202206232359
Blood Product Expiration Date: 202206232359
ISSUE DATE / TIME: 202206172245
ISSUE DATE / TIME: 202206180556
ISSUE DATE / TIME: 202206180844
ISSUE DATE / TIME: 202206181900
Unit Type and Rh: 9500
Unit Type and Rh: 9500
Unit Type and Rh: 9500
Unit Type and Rh: 9500
Unit Type and Rh: 9500
Unit Type and Rh: 9500

## 2021-01-26 LAB — CBC WITH DIFFERENTIAL/PLATELET
Abs Immature Granulocytes: 0.08 10*3/uL — ABNORMAL HIGH (ref 0.00–0.07)
Basophils Absolute: 0 10*3/uL (ref 0.0–0.1)
Basophils Relative: 0 %
Eosinophils Absolute: 0.3 10*3/uL (ref 0.0–0.5)
Eosinophils Relative: 4 %
HCT: 25.4 % — ABNORMAL LOW (ref 39.0–52.0)
Hemoglobin: 8.5 g/dL — ABNORMAL LOW (ref 13.0–17.0)
Immature Granulocytes: 1 %
Lymphocytes Relative: 5 %
Lymphs Abs: 0.4 10*3/uL — ABNORMAL LOW (ref 0.7–4.0)
MCH: 30.7 pg (ref 26.0–34.0)
MCHC: 33.5 g/dL (ref 30.0–36.0)
MCV: 91.7 fL (ref 80.0–100.0)
Monocytes Absolute: 0.9 10*3/uL (ref 0.1–1.0)
Monocytes Relative: 11 %
Neutro Abs: 6.4 10*3/uL (ref 1.7–7.7)
Neutrophils Relative %: 79 %
Platelets: 205 10*3/uL (ref 150–400)
RBC: 2.77 MIL/uL — ABNORMAL LOW (ref 4.22–5.81)
RDW: 20.1 % — ABNORMAL HIGH (ref 11.5–15.5)
WBC: 8.1 10*3/uL (ref 4.0–10.5)
nRBC: 0.2 % (ref 0.0–0.2)

## 2021-01-26 LAB — TYPE AND SCREEN
ABO/RH(D): O NEG
Antibody Screen: POSITIVE
Donor AG Type: NEGATIVE
Donor AG Type: NEGATIVE
Donor AG Type: NEGATIVE
Donor AG Type: NEGATIVE
Unit division: 0
Unit division: 0
Unit division: 0
Unit division: 0
Unit division: 0
Unit division: 0

## 2021-01-26 LAB — BASIC METABOLIC PANEL
Anion gap: 8 (ref 5–15)
BUN: 25 mg/dL — ABNORMAL HIGH (ref 8–23)
CO2: 27 mmol/L (ref 22–32)
Calcium: 8.2 mg/dL — ABNORMAL LOW (ref 8.9–10.3)
Chloride: 100 mmol/L (ref 98–111)
Creatinine, Ser: 1.44 mg/dL — ABNORMAL HIGH (ref 0.61–1.24)
GFR, Estimated: 52 mL/min — ABNORMAL LOW (ref >60)
Glucose, Bld: 96 mg/dL (ref 70–99)
Potassium: 3.5 mmol/L (ref 3.5–5.1)
Sodium: 135 mmol/L (ref 135–145)

## 2021-01-26 LAB — GLUCOSE, CAPILLARY
Glucose-Capillary: 97 mg/dL (ref 70–99)
Glucose-Capillary: 88 mg/dL (ref 70–99)
Glucose-Capillary: 81 mg/dL (ref 70–99)
Glucose-Capillary: 100 mg/dL — ABNORMAL HIGH (ref 70–99)

## 2021-01-26 MED ORDER — BISACODYL 5 MG PO TBEC
10.0000 mg | DELAYED_RELEASE_TABLET | Freq: Once | ORAL | Status: AC
  Administered 2021-01-26: 10 mg via ORAL
  Filled 2021-01-26: qty 2

## 2021-01-26 MED ORDER — PEG-KCL-NACL-NASULF-NA ASC-C 100 G PO SOLR: 1.0000 | Freq: Once | ORAL | Status: DC

## 2021-01-26 MED ORDER — SODIUM CHLORIDE 0.9 % IV SOLN: INTRAVENOUS | Status: DC

## 2021-01-26 MED ORDER — PEG-KCL-NACL-NASULF-NA ASC-C 100 G PO SOLR: 0.5000 | Freq: Once | ORAL | Status: AC

## 2021-01-26 MED ORDER — PEG-KCL-NACL-NASULF-NA ASC-C 100 G PO SOLR
0.5000 | Freq: Once | ORAL | Status: AC
  Administered 2021-01-26: 100 g via ORAL
  Filled 2021-01-26: qty 1

## 2021-01-26 MED ORDER — PEG-KCL-NACL-NASULF-NA ASC-C 100 G PO SOLR: 0.5000 | Freq: Once | ORAL | Status: DC

## 2021-01-26 MED ORDER — IPRATROPIUM-ALBUTEROL 0.5-2.5 (3) MG/3ML IN SOLN
3.0000 mL | Freq: Four times a day (QID) | RESPIRATORY_TRACT | Status: DC
  Administered 2021-01-26 – 2021-01-27 (×4): 3 mL via RESPIRATORY_TRACT
  Filled 2021-01-26 (×4): qty 3

## 2021-01-26 MED ORDER — BISACODYL 5 MG PO TBEC
10.0000 mg | DELAYED_RELEASE_TABLET | Freq: Once | ORAL | Status: AC
  Administered 2021-01-27: 10 mg via ORAL
  Filled 2021-01-26: qty 2

## 2021-01-26 MED ORDER — GUAIFENESIN-DM 100-10 MG/5ML PO SYRP
5.0000 mL | ORAL_SOLUTION | ORAL | Status: DC | PRN
  Administered 2021-01-26 – 2021-01-31 (×4): 5 mL via ORAL
  Filled 2021-01-26 (×3): qty 5

## 2021-01-26 NOTE — Progress Notes (Signed)
   01/26/21 0238  Vitals  Temp 98 F (36.7 C)  Temp Source Oral  BP (!) 115/92  MAP (mmHg) 101  BP Location Right Arm  BP Method Automatic  Patient Position (if appropriate) Sitting  Pulse Rate 77  Pulse Rate Source Dinamap  Resp (!) 24  Level of Consciousness  Level of Consciousness Alert  MEWS COLOR  MEWS Score Color Green  Oxygen Therapy  SpO2 92 %  O2 Device Nasal Cannula  O2 Flow Rate (L/min) 4 L/min  Patient Activity (if Appropriate) In bed  MEWS Score  MEWS Temp 0  MEWS Systolic 0  MEWS Pulse 0  MEWS RR 1  MEWS LOC 0  MEWS Score 1  Provider Notification  Provider Name/Title Dr. Myna Hidalgo  Date Provider Notified 01/26/21  Time Provider Notified [phone]  Notification Type Page  Notification Reason Change in status  Provider response See new orders  Date of Provider Response 01/26/21   Patient again complaining of increasing shortness of breath and chest pressure.  VS are stable.  His breathing is labored.  Patient is now producing yellow phlegm.  Dr. Myna Hidalgo made aware. Albuterol ordered.  Respiratory called and came to room to assess patient and give treatment.  Patient is now resting comfortably.  Will continue to monitor.  Earleen Reaper RN

## 2021-01-26 NOTE — Progress Notes (Signed)
Gastroenterology Inpatient Follow-up Note   PATIENT IDENTIFICATION  Sean Riley is a 70 y.o. male  Hospital Day: 3  SUBJECTIVE  Patient is seen and examined today with wife and son at bedside. Patient feeling slightly stronger with improved strength. Respiratory status stable. Patient denies any fevers or chills. Hemogram has improved status posttransfusion yesterday. Eliquis washing out and will be done by tomorrow.   Patient denies any abdominal pain or discomfort. No fevers or chills.   OBJECTIVE  Scheduled Inpatient Medications:   ALPRAZolam  1 mg Oral QHS   escitalopram  10 mg Oral Daily   insulin aspart  0-9 Units Subcutaneous TID WC   ipratropium-albuterol  3 mL Nebulization QID   metoprolol tartrate  12.5 mg Oral BID   pantoprazole (PROTONIX) IV  40 mg Intravenous Q12H   peg 3350 powder  0.5 kit Oral Once   And   peg 3350 powder  0.5 kit Oral Once   rosuvastatin  5 mg Oral Daily   Continuous Inpatient Infusions:  PRN Inpatient Medications: albuterol, guaiFENesin-dextromethorphan   Physical Examination  Temp:  [97.5 F (36.4 C)-98.7 F (37.1 C)] 98.7 F (37.1 C) (06/19 0709) Pulse Rate:  [75-99] 99 (06/19 0709) Resp:  [20-24] 20 (06/19 0800) BP: (115-171)/(45-92) 119/58 (06/19 0800) SpO2:  [89 %-96 %] 95 % (06/19 1502) Temp (24hrs), Avg:97.8 F (36.6 C), Min:97.5 F (36.4 C), Max:98.7 F (37.1 C)  Weight: 89.3 kg GEN: Chronically ill-appearing but nontoxic PSYCH: Cooperative, without pressured speech EYE: Pale conjunctive a ENT: MMM, nasal cannula in place CV: Nontachycardic RESP: Some rales noted but no wheezing GI: NABS, soft, protuberant abdomen, distended, no rebound or guarding MSK/EXT: Lower extremity edema SKIN: No jaundice NEURO:  Alert & Oriented x 3, no focal deficits   Review of Data   Laboratory Studies   Recent Labs  Lab 01/26/21 0247  NA 135  K 3.5  CL 100  CO2 27  BUN 25*  CREATININE 1.44*  GLUCOSE 96  CALCIUM 8.2*    Recent Labs  Lab 01/24/21 1829  AST 17  ALT 15  ALKPHOS 64    Recent Labs  Lab 01/24/21 1829 01/24/21 1845 01/25/21 0245 01/25/21 1324 01/26/21 0247  WBC 11.4*  --  8.2  --  8.1  HGB 5.0*   < > 5.6*   < > 8.5*  HCT 16.2*   < > 17.9*   < > 25.4*  PLT 232  --  214  --  205   < > = values in this interval not displayed.   No results for input(s): APTT, INR in the last 168 hours. MELD-Na score: 27 at 09/07/2020 12:32 AM MELD score: 23 at 09/07/2020 12:32 AM Calculated from: Serum Creatinine: 1.77 mg/dL at 09/07/2020 12:32 AM Serum Sodium: 129 mmol/L at 09/07/2020 12:32 AM Total Bilirubin: 1.7 mg/dL at 09/05/2020 11:19 PM INR(ratio): 2.2 at 09/05/2020 11:19 PM Age: 60 years  Imaging Studies  DG CHEST PORT 1 VIEW  Result Date: 01/25/2021 CLINICAL DATA:  Dyspnea EXAM: PORTABLE CHEST 1 VIEW COMPARISON:  01/24/2021 FINDINGS: Postoperative changes in the mediastinum. Aortic stent. Surgical clips in the right axilla. Cardiac enlargement. Mild vascular congestion. Bilateral perihilar and basilar infiltrates with bilateral pleural effusions. Appearances are similar to prior study. No pneumothorax. IMPRESSION: Cardiac enlargement, pulmonary vascular congestion, bilateral pulmonary infiltrates and effusions without significant change. Electronically Signed   By: Lucienne Capers M.D.   On: 01/25/2021 21:31   DG Chest Port 1 View  Result Date:  01/24/2021 CLINICAL DATA:  Cough hypoxia EXAM: PORTABLE CHEST 1 VIEW COMPARISON:  08/29/2020, CT 01/15/2021, 04/30/2020, 09/05/2020 FINDINGS: Post sternotomy changes. Aortic stent graft redemonstrated. Clips over the right axillary region. Similar loculated bilateral pleural effusions and airspace disease at the bases. Cardiomegaly. No pneumothorax. IMPRESSION: Overall no significant change compared with recent prior. Cardiomegaly with similar loculated pleural effusions and basilar airspace disease. Electronically Signed   By: Donavan Foil M.D.   On:  01/24/2021 19:23    GI Procedures and Studies  No relevant studies to review   ASSESSMENT  [name] is a 69 y.o. male with a pmh significant for CAD, COPD (on home O2), GERD, colon polyps, diverticulosis who presented with dark stools and anemia of unclear etiology.  Patient is hemodynamically stable.  He is up for EGD/colonoscopy tomorrow.  PPI therapy on board.  Hopefully we find a source if not video capsule endoscopy may need to be considered based on if the patient remains transfusion dependent.  Intravenous iron should be administered while in-house.  Plan for EGD/colonoscopy on Monday.  However, has longstanding constipation, if he is not clear by Monday morning then his procedures could be pushed to Tuesday.  The risks and benefits of endoscopic evaluation were discussed with the patient; these include but are not limited to the risk of perforation, infection, bleeding, missed lesions, lack of diagnosis, severe illness requiring hospitalization, as well as anesthesia and sedation related illnesses.  The patient and/or family is agreeable to proceed.  They understand that Dr. Carlean Purl will be performing his procedures tomorrow.   PLAN/RECOMMENDATIONS  Plan for colonoscopy tomorrow MoviPrep instructions in place -One half kit at 6 PM -One half kit at 2300 N.p[address]o. at midnight   Please page/call with questions or concerns.   Justice Britain, MD [facility] Office # [phone]    LOS: 2 days  Irving Copas  01/26/2021, 4:36 PM

## 2021-01-26 NOTE — H&P (View-Only) (Signed)
Gastroenterology Inpatient Follow-up Note   PATIENT IDENTIFICATION  Sean Riley is a 70 y.o. male  Hospital Day: 3  SUBJECTIVE  Patient is seen and examined today with wife and son at bedside. Patient feeling slightly stronger with improved strength. Respiratory status stable. Patient denies any fevers or chills. Hemogram has improved status posttransfusion yesterday. Eliquis washing out and will be done by tomorrow.   Patient denies any abdominal pain or discomfort. No fevers or chills.   OBJECTIVE  Scheduled Inpatient Medications:   ALPRAZolam  1 mg Oral QHS   escitalopram  10 mg Oral Daily   insulin aspart  0-9 Units Subcutaneous TID WC   ipratropium-albuterol  3 mL Nebulization QID   metoprolol tartrate  12.5 mg Oral BID   pantoprazole (PROTONIX) IV  40 mg Intravenous Q12H   peg 3350 powder  0.5 kit Oral Once   And   peg 3350 powder  0.5 kit Oral Once   rosuvastatin  5 mg Oral Daily   Continuous Inpatient Infusions:  PRN Inpatient Medications: albuterol, guaiFENesin-dextromethorphan   Physical Examination  Temp:  [97.5 F (36.4 C)-98.7 F (37.1 C)] 98.7 F (37.1 C) (06/19 0709) Pulse Rate:  [75-99] 99 (06/19 0709) Resp:  [20-24] 20 (06/19 0800) BP: (115-171)/(45-92) 119/58 (06/19 0800) SpO2:  [89 %-96 %] 95 % (06/19 1502) Temp (24hrs), Avg:97.8 F (36.6 C), Min:97.5 F (36.4 C), Max:98.7 F (37.1 C)  Weight: 89.3 kg GEN: Chronically ill-appearing but nontoxic PSYCH: Cooperative, without pressured speech EYE: Pale conjunctive a ENT: MMM, nasal cannula in place CV: Nontachycardic RESP: Some rales noted but no wheezing GI: NABS, soft, protuberant abdomen, distended, no rebound or guarding MSK/EXT: Lower extremity edema SKIN: No jaundice NEURO:  Alert & Oriented x 3, no focal deficits   Review of Data   Laboratory Studies   Recent Labs  Lab 01/26/21 0247  NA 135  K 3.5  CL 100  CO2 27  BUN 25*  CREATININE 1.44*  GLUCOSE 96  CALCIUM 8.2*    Recent Labs  Lab 01/24/21 1829  AST 17  ALT 15  ALKPHOS 64    Recent Labs  Lab 01/24/21 1829 01/24/21 1845 01/25/21 0245 01/25/21 1324 01/26/21 0247  WBC 11.4*  --  8.2  --  8.1  HGB 5.0*   < > 5.6*   < > 8.5*  HCT 16.2*   < > 17.9*   < > 25.4*  PLT 232  --  214  --  205   < > = values in this interval not displayed.   No results for input(s): APTT, INR in the last 168 hours. MELD-Na score: 27 at 09/07/2020 12:32 AM MELD score: 23 at 09/07/2020 12:32 AM Calculated from: Serum Creatinine: 1.77 mg/dL at 02/26/525 00:00 AM Serum Sodium: 129 mmol/L at 09/07/2020 12:32 AM Total Bilirubin: 1.7 mg/dL at 8/63/5460 69:57 PM INR(ratio): 2.2 at 09/05/2020 11:19 PM Age: 34 years  Imaging Studies  DG CHEST PORT 1 VIEW  Result Date: 01/25/2021 CLINICAL DATA:  Dyspnea EXAM: PORTABLE CHEST 1 VIEW COMPARISON:  01/24/2021 FINDINGS: Postoperative changes in the mediastinum. Aortic stent. Surgical clips in the right axilla. Cardiac enlargement. Mild vascular congestion. Bilateral perihilar and basilar infiltrates with bilateral pleural effusions. Appearances are similar to prior study. No pneumothorax. IMPRESSION: Cardiac enlargement, pulmonary vascular congestion, bilateral pulmonary infiltrates and effusions without significant change. Electronically Signed   By: Burman Nieves M.D.   On: 01/25/2021 21:31   DG Chest Port 1 View  Result Date:  01/24/2021 CLINICAL DATA:  Cough hypoxia EXAM: PORTABLE CHEST 1 VIEW COMPARISON:  08/29/2020, CT 01/15/2021, 04/30/2020, 09/05/2020 FINDINGS: Post sternotomy changes. Aortic stent graft redemonstrated. Clips over the right axillary region. Similar loculated bilateral pleural effusions and airspace disease at the bases. Cardiomegaly. No pneumothorax. IMPRESSION: Overall no significant change compared with recent prior. Cardiomegaly with similar loculated pleural effusions and basilar airspace disease. Electronically Signed   By: Donavan Foil M.D.   On:  01/24/2021 19:23    GI Procedures and Studies  No relevant studies to review   ASSESSMENT  [name] is a 69 y.o. male with a pmh significant for CAD, COPD (on home O2), GERD, colon polyps, diverticulosis who presented with dark stools and anemia of unclear etiology.  Patient is hemodynamically stable.  He is up for EGD/colonoscopy tomorrow.  PPI therapy on board.  Hopefully we find a source if not video capsule endoscopy may need to be considered based on if the patient remains transfusion dependent.  Intravenous iron should be administered while in-house.  Plan for EGD/colonoscopy on Monday.  However, has longstanding constipation, if he is not clear by Monday morning then his procedures could be pushed to Tuesday.  The risks and benefits of endoscopic evaluation were discussed with the patient; these include but are not limited to the risk of perforation, infection, bleeding, missed lesions, lack of diagnosis, severe illness requiring hospitalization, as well as anesthesia and sedation related illnesses.  The patient and/or family is agreeable to proceed.  They understand that Dr. Carlean Riley will be performing his procedures tomorrow.   PLAN/RECOMMENDATIONS  Plan for colonoscopy tomorrow MoviPrep instructions in place -One half kit at 6 PM -One half kit at 2300 N.p.o. at midnight   Please page/call with questions or concerns.   Sean Britain, MD [facility] Office # [phone]    LOS: 2 days  Sean Riley  01/26/2021, 4:36 PM

## 2021-01-26 NOTE — Progress Notes (Addendum)
PROGRESS NOTE    Sean Riley  DJM:[id number] DOB: [birth year] DOA: 01/24/2021 PCP: Colon Branch, MD     Brief Narrative:  70 y.o. WM PMHx  persistent A. 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 Also endorses generalized weakness. Reports this has been present for 5 days, gradually worsening. Is on 3L O2 by [place] at home for COPD, had to increase this amount and then wife turned back down when O2 saturations stabilized. Per EMS, he was hypoxic in mid 80s on 3L. Chest pain is left sided in nature. Has no diaphoresis, arm pain, jaw claudication.   Notes that he had a bowel movement earlier this week that was black and tarry. He has not had BM since. No hematochezia. No hematemesis. No hemoptysis. No abdominal pain or heartburn symptoms. Has been taking Eliquis.      ED work-up/course:  Patient is a 70 year old male with complicated past medical history including atrial fibrillation anticoagulated on Eliquis, coronary artery disease, COPD, CHF, previous aortic dissection status postrepair in 2018 who presented to the emergency department via EMS with worsening left-sided chest pain (present over the last 5 days worsening about 2 hours prior to arrival), shortness of breath and found to be hypoxic into the mid 80s on his baseline 3 L nasal cannula. Emergency department he was ill-appearing but nontoxic. Borderline hypotensive.  Received 500 of crystalloid by EMS with improvement.  Afebrile on oral check however will check rectal temp. He is on 5 L nasal cannula with oxygen saturations fluctuating in the low to mid 90s. Active cough during my exam.  I am concerned for pneumonia. Chest x-ray ordered. Also highly considering ACS. EKG as above -atrial fibrillation with nonspecific ST-T wave changes. Ordered for cardiac biomarkers as well as BNP.  Please  see ED clinical course above for further medical decision making.   ED course: Patient is critically ill for the following reasons: Acute blood loss anemia causing hemorrhagic shock, acute hypoxic respiratory failure likely in the setting of poor perfusion and global ischemia, unstable angina. Impression is likely symptomatic anemia, likely GI loss in the setting of systemic anticoagulation.  No hematemesis. I ordered for a dose of IV Protonix and Rocephin. Patient getting blood transfused. Remained hemodynamically stable after crystalloid transfusion.  Admitted to medicine for further treatment.   Subjective: A/O x4, negative abdominal pain, negative nausea, negative nausea vomiting.  Patient in the middle of a capsule study.   Assessment & Plan: Covid vaccination;   Active Problems:   Hyperlipidemia   tobacco use d/o   Atrial fibrillation (HCC)   BPH (benign prostatic hyperplasia)   RBBB   HTN (hypertension)   S/P aortic dissection repair   COPD (chronic obstructive pulmonary disease) (HCC)   Atrial tachycardia (HCC)   Symptomatic anemia   GI bleed   Chest pain   Leukocytosis   CKD (chronic kidney disease)   UTI due to extended-spectrum beta lactamase (ESBL) producing Escherichia coli   Chronic diastolic CHF (congestive heart failure) (HCC)   Acute GI bleeding  Symptomatic Anemia with GI Bleed -Patient presenting with chest pain, SOB, dizziness. Found to be hypoxic to mid 80s on baseline 3L [place].  -Received 500 cc crystalloid by EMS. HgB 5.0 at presentation. Initially was mildly hypotensive, now hemodynamically stable.  -Received additional 500 cc NS bolus in ED.  -Remote history of  Fe deficiency anemia, not currently on supplement.  -Is on anticoagulation for Afib.  -Notes melena at home. FOBT positive in ED.  -Colonoscopy in 2017 with precancerous polyps; was recommended to repeat in 3 years. Has not had repeat colonoscopy since.  -6/17 transfuse 1 unit PRBC - 6/18  transfuse 3 units PRBC    Chest Pain -Suspect related to anemia. Troponin normal. EKG does not show any signs of STEMI.  -Resolved   Chronic Diastolic CHF Appears to be hypovolemic at presentation. Last echo EF 60-65% with right ventricular function mildly reduced. -monitor volume status as receives fluids and blood products -strict I/Os -daily weights -hold Torsemide    Afib -Seen on EKG at admit. HR is mildly elevated, likely related to anemia. --Eliquis (hold) given anemia and active bleed -Metoprolol 12.5 mg BID -Flecainide (hold)   COPD -Acute respiratory failure with hypoxia resolved - Titrate O2 to maintain SPO2 > 92% - Not on long-term controller inhaler for COPD - Acute on chronic respiratory failure related to above needs to be addressed for long-term management of her respiratory status -Patient requires follow-up as an outpatient spirometry, DLCO.   Leukocytosis -WBC 11.4. Had leukocytosis in Jan 2022 to 12 at admission with no clear etiology.  Lactic acid 1.6. Covid and flu neg. -UA see UTI  CKD III Baseline Cr 1.8 Lab Results  Component Value Date   CREATININE 1.34 (H) 01/27/2021   CREATININE 1.44 (H) 01/26/2021   CREATININE 1.77 (H) 01/25/2021   CREATININE 1.82 (H) 01/24/2021   CREATININE 1.89 (H) 12/31/2020  -Better than baseline  Impaired Glucose Tolerance -6/18 hemoglobin A1c = 4.4 which does not even meet the guidelines for prediabetes  -On discharge would not restart Jardiance.  Patient can discuss with his when/if to restart      Chronic anxiety/depression -Resume home regimen   Hyperlipidemia -Resume home regimen.       DVT prophylaxis: SCD (secondary to bleed) Code Status: Full Family Communication: 6/20 wife at bedside for discussion of plan of care all questions answered Status is: Inpatient    Dispo: The patient is from: Home              Anticipated d/c is to: Home              Anticipated d/c date is: 6/21               Patient currently unstable      Consultants:  GI   Procedures/Significant Events:      I have personally reviewed and interpreted all radiology studies and my findings are as above.  VENTILATOR SETTINGS:    Cultures   Antimicrobials: Anti-infectives (From admission, onward)    Start     Ordered Stop   01/27/21 1615  meropenem (MERREM) 1 g in sodium chloride 0.9 % 100 mL IVPB        01/27/21 1547     01/24/21 2100  cefTRIAXone (ROCEPHIN) 2 g in sodium chloride 0.9 % 100 mL IVPB        01/24/21 2057 01/24/21 2232         Devices    LINES / TUBES:      Continuous Infusions:  meropenem (MERREM) IV Stopped (01/27/21 1808)     Objective: Vitals:   01/27/21 1118 01/27/21 1154 01/27/21 1528 01/27/21 1741  BP: (!) 129/51 (!) 132/56  (!) 106/59  Pulse: 78 (!) 51  99  Resp: (!) 24 17  18   Temp:  97.7 F (  36.5 C)    TempSrc:  Oral    SpO2: 98% 94% 93% 93%  Weight:      Height:        Intake/Output Summary (Last 24 hours) at 01/27/2021 1952 Last data filed at 01/27/2021 1831 Gross per 24 hour  Intake 2430.04 ml  Output 900 ml  Net 1530.04 ml   Filed Weights   01/24/21 1805  Weight: 89.3 kg    Examination:  General: A/O x4 No acute respiratory distress Eyes: negative scleral hemorrhage, negative anisocoria, negative icterus ENT: Negative Runny nose, negative gingival bleeding, Neck:  Negative scars, masses, torticollis, lymphadenopathy, JVD Lungs: Clear to auscultation bilaterally without wheezes or crackles Cardiovascular: Regular rate and rhythm without murmur gallop or rub normal S1 and S2 Abdomen: negative abdominal pain, nondistended, positive soft, bowel sounds, no rebound, no ascites, no appreciable mass.  Patient has a pill camera currently transmitting pictures of his GI tract in place. Extremities: No significant cyanosis, clubbing, or edema bilateral lower extremities Skin: Negative rashes, lesions, ulcers Psychiatric:  Negative  depression, negative anxiety, negative fatigue, negative mania  Central nervous system:  Cranial nerves II through XII intact, tongue/uvula midline, all extremities muscle strength 5/5, sensation intact throughout, negative dysarthria, negative expressive aphasia, negative receptive aphasia.  .     Data Reviewed: Care during the described time interval was provided by me .  I have reviewed this patient's available data, including medical history, events of note, physical examination, and all test results as part of my evaluation.  CBC: Recent Labs  Lab 01/24/21 1829 01/24/21 1845 01/25/21 0245 01/25/21 1324 01/26/21 0247 01/27/21 0748  WBC 11.4*  --  8.2  --  8.1 6.3  NEUTROABS 9.3*  --   --   --  6.4  --   HGB 5.0* 5.1* 5.6* 7.5* 8.5* 8.9*  HCT 16.2* 15.0* 17.9* 22.7* 25.4* 27.6*  MCV 103.8*  --  101.7*  --  91.7 94.8  PLT 232  --  214  --  205 098   Basic Metabolic Panel: Recent Labs  Lab 01/24/21 1829 01/24/21 1845 01/25/21 0245 01/26/21 0247 01/27/21 0748  NA 135 136 136 135 139  K 3.6 3.6 3.7 3.5 3.5  CL 99  --  100 100 104  CO2 26  --  26 27 27   GLUCOSE 131*  --  118* 96 96  BUN 47*  --  43* 25* 15  CREATININE 1.82*  --  1.77* 1.44* 1.34*  CALCIUM 8.5*  --  8.1* 8.2* 8.5*   GFR: Estimated Creatinine Clearance: 55.5 mL/min (A) (by C-G formula based on SCr of 1.34 mg/dL (H)). Liver Function Tests: Recent Labs  Lab 01/24/21 1829  AST 17  ALT 15  ALKPHOS 64  BILITOT 0.7  PROT 5.9*  ALBUMIN 3.1*   No results for input(s): LIPASE, AMYLASE in the last 168 hours. No results for input(s): AMMONIA in the last 168 hours. Coagulation Profile: No results for input(s): INR, PROTIME in the last 168 hours. Cardiac Enzymes: No results for input(s): CKTOTAL, CKMB, CKMBINDEX, TROPONINI in the last 168 hours. BNP (last 3 results) No results for input(s): PROBNP in the last 8760 hours. HbA1C: Recent Labs    01/25/21 0245  HGBA1C 4.4*   CBG: Recent Labs  Lab  01/26/21 1628 01/26/21 2042 01/27/21 0712 01/27/21 1151 01/27/21 1739  GLUCAP 81 100* 111* 76 179*   Lipid Profile: No results for input(s): CHOL, HDL, LDLCALC, TRIG, CHOLHDL, LDLDIRECT in the last 72 hours.  Thyroid Function Tests: No results for input(s): TSH, T4TOTAL, FREET4, T3FREE, THYROIDAB in the last 72 hours. Anemia Panel: No results for input(s): VITAMINB12, FOLATE, FERRITIN, TIBC, IRON, RETICCTPCT in the last 72 hours. Sepsis Labs: Recent Labs  Lab 01/24/21 1829 01/25/21 0245  LATICACIDVEN 1.6 0.7    Recent Results (from the past 240 hour(s))  Urine culture     Status: Abnormal   Collection Time: 01/24/21  6:29 PM   Specimen: Urine, Random  Result Value Ref Range Status   Specimen Description URINE, RANDOM  Final   Special Requests   Final    NONE Performed at [facility], [place] [address] [place], [place] [address]    Culture (A)  Final    70,000 COLONIES/mL ESCHERICHIA COLI Confirmed Extended Spectrum Beta-Lactamase Producer (ESBL).  In bloodstream infections from ESBL organisms, carbapenems are preferred over piperacillin/tazobactam. They are shown to have a lower risk of mortality.    Report Status 01/27/2021 FINAL  Final   Organism ID, Bacteria ESCHERICHIA COLI (A)  Final      Susceptibility   Escherichia coli - MIC*    AMPICILLIN >=32 RESISTANT Resistant     CEFAZOLIN >=64 RESISTANT Resistant     CEFEPIME 16 RESISTANT Resistant     CEFTRIAXONE >=64 RESISTANT Resistant     CIPROFLOXACIN >=4 RESISTANT Resistant     GENTAMICIN 2 SENSITIVE Sensitive     IMIPENEM <=0.25 SENSITIVE Sensitive     NITROFURANTOIN <=16 SENSITIVE Sensitive     TRIMETH/SULFA >=320 RESISTANT Resistant     AMPICILLIN/SULBACTAM >=32 RESISTANT Resistant     PIP/TAZO 8 SENSITIVE Sensitive     * 70,000 COLONIES/mL ESCHERICHIA COLI  Blood culture (routine x 2)     Status: None (Preliminary result)   Collection Time: 01/24/21  6:29 PM   Specimen: BLOOD  Result Value Ref Range  Status   Specimen Description BLOOD LEFT ANTECUBITAL  Final   Special Requests   Final    BOTTLES DRAWN AEROBIC AND ANAEROBIC Blood Culture results may not be optimal due to an excessive volume of blood received in culture bottles   Culture   Final    NO GROWTH 3 DAYS Performed at [facility], [address], [place], [place] [address]    Report Status PENDING  Incomplete  Resp Panel by RT-PCR (Flu A&B, Covid) Nasopharyngeal Swab     Status: None   Collection Time: 01/24/21  6:29 PM   Specimen: Nasopharyngeal Swab; Nasopharyngeal(NP) swabs in vial transport medium  Result Value Ref Range Status   SARS Coronavirus 2 by RT PCR NEGATIVE NEGATIVE Final    Comment: (NOTE) SARS-CoV-2 target nucleic acids are NOT DETECTED.  The SARS-CoV-2 RNA is generally detectable in upper respiratory specimens during the acute phase of infection. The lowest concentration of SARS-CoV-2 viral copies this assay can detect is 138 copies/mL. A negative result does not preclude SARS-Cov-2 infection and should not be used as the sole basis for treatment or other patient management decisions. A negative result may occur with  improper specimen collection/handling, submission of specimen other than nasopharyngeal swab, presence of viral mutation(s) within the areas targeted by this assay, and inadequate number of viral copies(<138 copies/mL). A negative result must be combined with clinical observations, patient history, and epidemiological information. The expected result is Negative.  Fact Sheet for Patients:  EntrepreneurPulse.com.au  Fact Sheet for Healthcare Providers:  IncredibleEmployment.be  This test is no t yet approved or cleared by the [place] and  has been authorized for detection and/or diagnosis of SARS-CoV-2 by FDA under an Emergency Use Authorization (EUA). This EUA will remain  in effect (meaning this test can be used) for the duration of  the COVID-19 declaration under Section 564(b)(1) of the Act, 21 U.S.C.section 360bbb-3(b)(1), unless the authorization is terminated  or revoked sooner.       Influenza A by PCR NEGATIVE NEGATIVE Final   Influenza B by PCR NEGATIVE NEGATIVE Final    Comment: (NOTE) The Xpert Xpress SARS-CoV-2/FLU/RSV plus assay is intended as an aid in the diagnosis of influenza from Nasopharyngeal swab specimens and should not be used as a sole basis for treatment. Nasal washings and aspirates are unacceptable for Xpert Xpress SARS-CoV-2/FLU/RSV testing.  Fact Sheet for Patients: EntrepreneurPulse.com.au  Fact Sheet for Healthcare Providers: IncredibleEmployment.be  This test is not yet approved or cleared by the [place] FDA and has been authorized for detection and/or diagnosis of SARS-CoV-2 by FDA under an Emergency Use Authorization (EUA). This EUA will remain in effect (meaning this test can be used) for the duration of the COVID-19 declaration under Section 564(b)(1) of the Act, 21 U.S.C. section 360bbb-3(b)(1), unless the authorization is terminated or revoked.  Performed at [facility], [place] [address], [place], [place] [address]   Blood culture (routine x 2)     Status: None (Preliminary result)   Collection Time: 01/24/21  9:10 PM   Specimen: BLOOD  Result Value Ref Range Status   Specimen Description BLOOD RIGHT ARM  Final   Special Requests   Final    BOTTLES DRAWN AEROBIC AND ANAEROBIC Blood Culture adequate volume   Culture   Final    NO GROWTH 3 DAYS Performed at [facility], [place] [address] [place], [place] [address]    Report Status PENDING  Incomplete  Culture, blood (single)     Status: None (Preliminary result)   Collection Time: 01/24/21  9:30 PM   Specimen: BLOOD  Result Value Ref Range Status   Specimen Description BLOOD SITE NOT SPECIFIED  Final   Special Requests   Final    BOTTLES DRAWN AEROBIC AND  ANAEROBIC Blood Culture results may not be optimal due to an inadequate volume of blood received in culture bottles   Culture   Final    NO GROWTH 2 DAYS Performed at [facility], [place] [address], [place], [place] [address]    Report Status PENDING  Incomplete         Radiology Studies: DG CHEST PORT 1 VIEW  Result Date: 01/25/2021 CLINICAL DATA:  Dyspnea EXAM: PORTABLE CHEST 1 VIEW COMPARISON:  01/24/2021 FINDINGS: Postoperative changes in the mediastinum. Aortic stent. Surgical clips in the right axilla. Cardiac enlargement. Mild vascular congestion. Bilateral perihilar and basilar infiltrates with bilateral pleural effusions. Appearances are similar to prior study. No pneumothorax. IMPRESSION: Cardiac enlargement, pulmonary vascular congestion, bilateral pulmonary infiltrates and effusions without significant change. Electronically Signed   By: Lucienne Capers M.D.   On: 01/25/2021 21:31        Scheduled Meds:  ALPRAZolam  1 mg Oral QHS   escitalopram  10 mg Oral Daily   insulin aspart  0-9 Units Subcutaneous TID WC   ipratropium-albuterol  3 mL Nebulization QID   metoprolol tartrate  12.5 mg Oral BID   pantoprazole (PROTONIX) IV  40 mg Intravenous Q12H   rosuvastatin  5 mg Oral Daily   Continuous Infusions:  meropenem (MERREM) IV Stopped (01/27/21 1808)  LOS: 3 days    Time spent:40 min    Douglass Dunshee, Geraldo Docker, MD [facility]   If 7PM-7AM, please contact night-coverage 01/27/2021, 7:52 PM

## 2021-01-27 ENCOUNTER — Encounter (HOSPITAL_COMMUNITY)
Admission: EM | Disposition: A | Discharge status: Home Health | Payer: Self-pay | Source: Home / Self Care | Attending: Internal Medicine

## 2021-01-27 ENCOUNTER — Inpatient Hospital Stay (HOSPITAL_COMMUNITY): Payer: Medicare Other | Admitting: Certified Registered Nurse Anesthetist

## 2021-01-27 ENCOUNTER — Encounter (HOSPITAL_COMMUNITY): Payer: Self-pay | Admitting: Internal Medicine

## 2021-01-27 DIAGNOSIS — K922 Gastrointestinal hemorrhage, unspecified: Secondary | ICD-10-CM | POA: Diagnosis present

## 2021-01-27 DIAGNOSIS — J431 Panlobular emphysema: Secondary | ICD-10-CM

## 2021-01-27 DIAGNOSIS — I5032 Chronic diastolic (congestive) heart failure: Secondary | ICD-10-CM | POA: Diagnosis present

## 2021-01-27 DIAGNOSIS — N390 Urinary tract infection, site not specified: Secondary | ICD-10-CM | POA: Diagnosis present

## 2021-01-27 DIAGNOSIS — B9629 Other Escherichia coli [E. coli] as the cause of diseases classified elsewhere: Secondary | ICD-10-CM | POA: Diagnosis present

## 2021-01-27 HISTORY — PX: GIVENS CAPSULE STUDY: SHX5432

## 2021-01-27 HISTORY — PX: ESOPHAGOGASTRODUODENOSCOPY: SHX5428

## 2021-01-27 HISTORY — PX: COLONOSCOPY: SHX5424

## 2021-01-27 HISTORY — PX: POLYPECTOMY: SHX5525

## 2021-01-27 LAB — HEMOGLOBIN A1C
Hgb A1c MFr Bld: 4.4 % — ABNORMAL LOW (ref 4.8–5.6)
Mean Plasma Glucose: 80 mg/dL

## 2021-01-27 LAB — CBC
HCT: 27.6 % — ABNORMAL LOW (ref 39.0–52.0)
Hemoglobin: 8.9 g/dL — ABNORMAL LOW (ref 13.0–17.0)
MCH: 30.6 pg (ref 26.0–34.0)
MCHC: 32.2 g/dL (ref 30.0–36.0)
MCV: 94.8 fL (ref 80.0–100.0)
Platelets: 225 10*3/uL (ref 150–400)
RBC: 2.91 MIL/uL — ABNORMAL LOW (ref 4.22–5.81)
RDW: 19 % — ABNORMAL HIGH (ref 11.5–15.5)
WBC: 6.3 10*3/uL (ref 4.0–10.5)
nRBC: 0 % (ref 0.0–0.2)

## 2021-01-27 LAB — BASIC METABOLIC PANEL
Anion gap: 8 (ref 5–15)
BUN: 15 mg/dL (ref 8–23)
CO2: 27 mmol/L (ref 22–32)
Calcium: 8.5 mg/dL — ABNORMAL LOW (ref 8.9–10.3)
Chloride: 104 mmol/L (ref 98–111)
Creatinine, Ser: 1.34 mg/dL — ABNORMAL HIGH (ref 0.61–1.24)
GFR, Estimated: 57 mL/min — ABNORMAL LOW (ref >60)
Glucose, Bld: 96 mg/dL (ref 70–99)
Potassium: 3.5 mmol/L (ref 3.5–5.1)
Sodium: 139 mmol/L (ref 135–145)

## 2021-01-27 LAB — URINE CULTURE: Culture: 70000 — AB

## 2021-01-27 LAB — GLUCOSE, CAPILLARY
Glucose-Capillary: 111 mg/dL — ABNORMAL HIGH (ref 70–99)
Glucose-Capillary: 76 mg/dL (ref 70–99)
Glucose-Capillary: 179 mg/dL — ABNORMAL HIGH (ref 70–99)

## 2021-01-27 SURGERY — COLONOSCOPY
Anesthesia: Monitor Anesthesia Care

## 2021-01-27 SURGERY — IMAGING PROCEDURE, GI TRACT, INTRALUMINAL, VIA CAPSULE

## 2021-01-27 MED ORDER — SODIUM CHLORIDE 0.9 % IV SOLN: INTRAVENOUS | Status: DC

## 2021-01-27 MED ORDER — PROPOFOL 10 MG/ML IV BOLUS
INTRAVENOUS | Status: DC | PRN
  Administered 2021-01-27 (×2): 10 mg via INTRAVENOUS

## 2021-01-27 MED ORDER — LACTATED RINGERS IV SOLN: INTRAVENOUS | Status: DC | PRN

## 2021-01-27 MED ORDER — METOCLOPRAMIDE HCL 5 MG/ML IJ SOLN
10.0000 mg | Freq: Once | INTRAMUSCULAR | Status: AC
  Administered 2021-01-27: 10 mg via INTRAVENOUS
  Filled 2021-01-27: qty 2

## 2021-01-27 MED ORDER — SODIUM CHLORIDE 0.9 % IV SOLN
1.0000 g | Freq: Three times a day (TID) | INTRAVENOUS | Status: DC
  Administered 2021-01-27 – 2021-01-31 (×13): 1 g via INTRAVENOUS
  Filled 2021-01-27 (×15): qty 1

## 2021-01-27 MED ORDER — PROPOFOL 500 MG/50ML IV EMUL
INTRAVENOUS | Status: DC | PRN
  Administered 2021-01-27: 75 ug/kg/min via INTRAVENOUS

## 2021-01-27 MED ORDER — PHENYLEPHRINE 40 MCG/ML (10ML) SYRINGE FOR IV PUSH (FOR BLOOD PRESSURE SUPPORT)
PREFILLED_SYRINGE | INTRAVENOUS | Status: DC | PRN
  Administered 2021-01-27 (×2): 80 ug via INTRAVENOUS

## 2021-01-27 NOTE — Op Note (Addendum)
[facility] Patient Name: Sean Riley Procedure Date : 01/27/2021 MRN: [id number] Attending MD: Gatha Mayer , MD Date of Birth: May 04, 1951 CSN: [id number] Age: 70 Admit Type: Inpatient Procedure:                Colonoscopy Indications:              Heme positive stool, Melena Providers:                Gatha Mayer, MD, Doristine Johns, RN, Lesia Sago, Technician Referring MD:              Medicines:                Propofol per Anesthesia, Monitored Anesthesia Care Complications:            No immediate complications. Estimated Blood Loss:     Estimated blood loss was minimal. Procedure:                Pre-Anesthesia Assessment:                           - Prior to the procedure, a History and Physical                            was performed, and patient medications and                            allergies were reviewed. The patient's tolerance of                            previous anesthesia was also reviewed. The risks                            and benefits of the procedure and the sedation                            options and risks were discussed with the patient.                            All questions were answered, and informed consent                            was obtained. Prior Anticoagulants: The patient                            last took Eliquis (apixaban) 2 days prior to the                            procedure. ASA Grade Assessment: III - A patient                            with severe systemic disease. After reviewing the  risks and benefits, the patient was deemed in                            satisfactory condition to undergo the procedure.                           - Prior to the procedure, a History and Physical                            was performed, and patient medications and                            allergies were reviewed. The patient's tolerance of                             previous anesthesia was also reviewed. The risks                            and benefits of the procedure and the sedation                            options and risks were discussed with the patient.                            All questions were answered, and informed consent                            was obtained. Prior Anticoagulants: The patient                            last took Eliquis (apixaban) 2 days prior to the                            procedure. ASA Grade Assessment: III - A patient                            with severe systemic disease. After reviewing the                            risks and benefits, the patient was deemed in                            satisfactory condition to undergo the procedure.                           After obtaining informed consent, the colonoscope                            was passed under direct vision. Throughout the                            procedure, the patient's blood pressure, pulse, and  oxygen saturations were monitored continuously. The                            [id number]-[id number] ([id number]) Olympus colonoscope was                            introduced through the anus and advanced to the the                            cecum, identified by appendiceal orifice and                            ileocecal valve. The colonoscopy was performed                            without difficulty. The patient tolerated the                            procedure well. The quality of the bowel                            preparation was adequate. The ileocecal valve,                            appendiceal orifice, and rectum were photographed. Scope In: 10:33:49 AM Scope Out: 10:49:56 AM Scope Withdrawal Time: 0 hours 13 minutes 4 seconds  Total Procedure Duration: 0 hours 16 minutes 7 seconds  Findings:      The perianal and digital rectal examinations were normal.      Three sessile polyps were found in the descending  colon and transverse       colon. The polyps were diminutive in size. These polyps were removed       with a cold snare. Resection and retrieval were complete. Verification       of patient identification for the specimen was done. Estimated blood       loss was minimal.      Diverticula were found in the sigmoid colon and descending colon.      Non-bleeding internal hemorrhoids were found.      The exam was otherwise without abnormality on direct and retroflexion       views. Impression:               - Three diminutive polyps in the descending colon                            and in the transverse colon, removed with a cold                            snare. Resected and retrieved.                           - Diverticulosis in the sigmoid colon and in the                            descending colon.                           -  Non-bleeding internal hemorrhoids.                           - The examination was otherwise normal on direct                            and retroflexion views. Recommendation:           - Patient has a contact number available for                            emergencies. The signs and symptoms of potential                            delayed complications were discussed with the                            patient. Return to normal activities tomorrow.                            Written discharge instructions were provided to the                            patient.                           - Will do capsule endoscopy                           resume Eliquis tomorrow                           neither EGD nor colonoscopy have revealed a                            credinle source for his dark stools with major                            decrease in Hgb                           Check B12 also = 280 in 2020 Procedure Code(s):        --- Professional ---                           [phone], Colonoscopy, flexible; with removal of                            tumor(s),  polyp(s), or other lesion(s) by snare                            technique Diagnosis Code(s):        --- Professional ---                           K63.5, Polyp of colon  R19.5, Other fecal abnormalities                           K92.1, Melena (includes Hematochezia)                           K57.30, Diverticulosis of large intestine without                            perforation or abscess without bleeding CPT copyright 2019 American Medical Association. All rights reserved. The codes documented in this report are preliminary and upon coder review may  be revised to meet current compliance requirements. Gatha Mayer, MD 01/27/2021 11:25:24 AM This report has been signed electronically. Number of Addenda: 0

## 2021-01-27 NOTE — Interval H&P Note (Signed)
History and Physical Interval Note:  01/27/2021 10:14 AM  Sean Riley  has presented today for surgery, with the diagnosis of Anemia, History of Colon Polyps, Chronic Anticoagulation.  The various methods of treatment have been discussed with the patient and family. After consideration of risks, benefits and other options for treatment, the patient has consented to  Procedure(s): COLONOSCOPY (N/A) ESOPHAGOGASTRODUODENOSCOPY (EGD) (N/A) as a surgical intervention.  The patient's history has been reviewed, patient examined, no change in status, stable for surgery.  I have reviewed the patient's chart and labs.  Questions were answered to the patient's satisfaction.     Silvano Rusk

## 2021-01-27 NOTE — Op Note (Signed)
[facility] Patient Name: Sean Riley Procedure Date : 01/27/2021 MRN: [id number] Attending MD: Gatha Mayer , MD Date of Birth: Mar 06, 1951 CSN: [id number] Age: 70 Admit Type: Inpatient Procedure:                Upper GI endoscopy Indications:              Heme positive stool, Melena Providers:                Gatha Mayer, MD, Doristine Johns, RN, Lesia Sago, Technician Referring MD:              Medicines:                Propofol per Anesthesia, Monitored Anesthesia Care Complications:            No immediate complications. Estimated Blood Loss:     Estimated blood loss: none. Procedure:                Pre-Anesthesia Assessment:                           - Prior to the procedure, a History and Physical                            was performed, and patient medications and                            allergies were reviewed. The patient's tolerance of                            previous anesthesia was also reviewed. The risks                            and benefits of the procedure and the sedation                            options and risks were discussed with the patient.                            All questions were answered, and informed consent                            was obtained. Prior Anticoagulants: The patient                            last took Eliquis (apixaban) 2 days prior to the                            procedure. ASA Grade Assessment: III - A patient                            with severe systemic disease. After reviewing the  risks and benefits, the patient was deemed in                            satisfactory condition to undergo the procedure.                           After obtaining informed consent, the endoscope was                            passed under direct vision. Throughout the                            procedure, the patient's blood pressure, pulse, and                             oxygen saturations were monitored continuously. The                            GIF-H190 ([id number]) Olympus gastroscope was                            introduced through the mouth, and advanced to the                            second part of duodenum. The upper GI endoscopy was                            accomplished without difficulty. The patient                            tolerated the procedure well. Scope In: Scope Out: Findings:      The esophagus was normal.      The stomach was normal.      The examined duodenum was normal.      The cardia and gastric fundus were normal on retroflexion. Impression:               - Normal esophagus.                           - Normal stomach.                           - Normal examined duodenum.                           - No specimens collected. Recommendation:           - Patient has a contact number available for                            emergencies. The signs and symptoms of potential                            delayed complications were discussed with the  patient. Return to normal activities tomorrow.                            Written discharge instructions were provided to the                            patient.                           - See the other procedure note for documentation of                            additional recommendations. Procedure Code(s):        --- Professional ---                           [phone], Esophagogastroduodenoscopy, flexible,                            transoral; diagnostic, including collection of                            specimen(s) by brushing or washing, when performed                            (separate procedure) Diagnosis Code(s):        --- Professional ---                           R19.5, Other fecal abnormalities                           K92.1, Melena (includes Hematochezia) CPT copyright 2019 American Medical Association. All rights reserved. The  codes documented in this report are preliminary and upon coder review may  be revised to meet current compliance requirements. Gatha Mayer, MD 01/27/2021 11:21:41 AM This report has been signed electronically. Number of Addenda: 0

## 2021-01-27 NOTE — Progress Notes (Signed)
PROGRESS NOTE    Sean Riley  RKY:[id number] DOB: 1951/05/15 DOA: 01/24/2021 PCP: Colon Branch, MD     Brief Narrative:  70 y.o. WM PMHx  persistent A. 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 Also endorses generalized weakness. Reports this has been present for 5 days, gradually worsening. Is on 3L O2 by [employer] at home for COPD, had to increase this amount and then wife turned back down when O2 saturations stabilized. Per EMS, he was hypoxic in mid 80s on 3L. Chest pain is left sided in nature. Has no diaphoresis, arm pain, jaw claudication.   Notes that he had a bowel movement earlier this week that was black and tarry. He has not had BM since. No hematochezia. No hematemesis. No hemoptysis. No abdominal pain or heartburn symptoms. Has been taking Eliquis.      ED work-up/course:  Patient is a 70 year old male with complicated past medical history including atrial fibrillation anticoagulated on Eliquis, coronary artery disease, COPD, CHF, previous aortic dissection status postrepair in 2018 who presented to the emergency department via EMS with worsening left-sided chest pain (present over the last 5 days worsening about 2 hours prior to arrival), shortness of breath and found to be hypoxic into the mid 80s on his baseline 3 L nasal cannula. Emergency department he was ill-appearing but nontoxic. Borderline hypotensive.  Received 500 of crystalloid by EMS with improvement.  Afebrile on oral check however will check rectal temp. He is on 5 L nasal cannula with oxygen saturations fluctuating in the low to mid 90s. Active cough during my exam.  I am concerned for pneumonia. Chest x-ray ordered. Also highly considering ACS. EKG as above -atrial fibrillation with nonspecific ST-T wave changes. Ordered for cardiac biomarkers as well as BNP.  Please  see ED clinical course above for further medical decision making.   ED course: Patient is critically ill for the following reasons: Acute blood loss anemia causing hemorrhagic shock, acute hypoxic respiratory failure likely in the setting of poor perfusion and global ischemia, unstable angina. Impression is likely symptomatic anemia, likely GI loss in the setting of systemic anticoagulation.  No hematemesis. I ordered for a dose of IV Protonix and Rocephin. Patient getting blood transfused. Remained hemodynamically stable after crystalloid transfusion.  Admitted to medicine for further treatment.   Subjective: A/O x4, negative abdominal pain, negative nausea, negative nausea vomiting.  Patient in the middle of a capsule study.   Assessment & Plan: Covid vaccination;   Active Problems:   Hyperlipidemia   tobacco use d/o   Atrial fibrillation (HCC)   BPH (benign prostatic hyperplasia)   RBBB   HTN (hypertension)   S/P aortic dissection repair   COPD (chronic obstructive pulmonary disease) (HCC)   Atrial tachycardia (HCC)   Symptomatic anemia   GI bleed   Chest pain   Leukocytosis   CKD (chronic kidney disease)   UTI due to extended-spectrum beta lactamase (ESBL) producing Escherichia coli   Chronic diastolic CHF (congestive heart failure) (HCC)   Acute GI bleeding  Symptomatic Anemia with GI Bleed -Patient presenting with chest pain, SOB, dizziness. Found to be hypoxic to mid 80s on baseline 3L [place].  -Received 500 cc crystalloid by EMS. HgB 5.0 at presentation. Initially was mildly hypotensive, now hemodynamically stable.  -Received additional 500 cc NS bolus in ED.  -Remote history of  Fe deficiency anemia, not currently on supplement.  -Is on anticoagulation for Afib.  -Notes melena at home. FOBT positive in ED.  -Colonoscopy in 2017 with precancerous polyps; was recommended to repeat in 3 years. Has not had repeat colonoscopy since.  -6/17 transfuse 1 unit PRBC - 6/18  transfuse 3 units PRBC  -6/20 s/p colonoscopy/EGD: See findings below -6/20 capsule study ongoing: Swallow pill cam at 1213   Chest Pain -Suspect related to anemia. Troponin normal. EKG does not show any signs of STEMI.  -Resolved   Chronic Diastolic CHF Appears to be hypovolemic at presentation. Last echo EF 60-65% with right ventricular function mildly reduced. -monitor volume status as receives fluids and blood products -strict I/Os -daily weights -hold Torsemide    Afib -Seen on EKG at admit. HR is mildly elevated, likely related to anemia. --Eliquis (hold) given anemia and active bleed -Metoprolol 12.5 mg BID -Flecainide (hold)   COPD -Acute respiratory failure with hypoxia resolved - Titrate O2 to maintain SPO2 > 92% - Not on long-term controller inhaler for COPD - Acute on chronic respiratory failure related to above needs to be addressed for long-term management of her respiratory status -Patient requires follow-up as an outpatient spirometry, DLCO.   Leukocytosis -WBC 11.4. Had leukocytosis in Jan 2022 to 12 at admission with no clear etiology.  Lactic acid 1.6. Covid and flu neg. -UA see UTI ESBL positive UTI - 6/20 discussed case with pharmacy recommended Meropenem 1 g q 8 hr  CKD III Baseline Cr 1.8 Lab Results  Component Value Date   CREATININE 1.34 (H) 01/27/2021   CREATININE 1.44 (H) 01/26/2021   CREATININE 1.77 (H) 01/25/2021   CREATININE 1.82 (H) 01/24/2021   CREATININE 1.89 (H) 12/31/2020   -Better than baseline  Impaired Glucose Tolerance -6/18 hemoglobin A1c = 4.4 which does not even meet the guidelines for prediabetes  -On discharge would not restart Jardiance.  Patient can discuss with his when/if to restart      Chronic anxiety/depression -Resume home regimen   Hyperlipidemia -Resume home regimen.       DVT prophylaxis: SCD (secondary to bleed) Code Status: Full Family Communication: 6/20 wife at bedside for discussion of plan of  care all questions answered Status is: Inpatient    Dispo: The patient is from: Home              Anticipated d/c is to: Home              Anticipated d/c date is: 6/21              Patient currently unstable      Consultants:  GI   Procedures/Significant Events:  6/20 s/p colonoscopy:Three diminutive polyps in the descending colon and in the transverse colon, removed with a cold snare. Resected and retrieved. - Diverticulosis in the sigmoid colon and in the descending colon. - Non-bleeding internal hemorrhoids. - The examination was otherwise normal on direct and retroflexion views. 6/20 s/p EGD - Normal esophagus. - Normal stomach. - Normal examined duodenum. - No specimens collected. 6/20 PillCam swallowed at 1213  I have personally reviewed and interpreted all radiology studies and my findings are as above.  VENTILATOR SETTINGS:    Cultures   Antimicrobials: Anti-infectives (From admission, onward)    Start     Ordered Stop   01/27/21 1615  meropenem (MERREM) 1 g in sodium chloride 0.9 % 100 mL IVPB        01/27/21 1547  01/24/21 2100  cefTRIAXone (ROCEPHIN) 2 g in sodium chloride 0.9 % 100 mL IVPB        01/24/21 2057 01/24/21 2232         Devices    LINES / TUBES:      Continuous Infusions:  meropenem (MERREM) IV Stopped (01/27/21 1808)     Objective: Vitals:   01/27/21 1154 01/27/21 1528 01/27/21 1741 01/27/21 2005  BP: (!) 132/56  (!) 106/59   Pulse: (!) 51  99   Resp: 17  18   Temp: 97.7 F (36.5 C)     TempSrc: Oral     SpO2: 94% 93% 93% 94%  Weight:      Height:        Intake/Output Summary (Last 24 hours) at 01/27/2021 2057 Last data filed at 01/27/2021 1831 Gross per 24 hour  Intake 2430.04 ml  Output 900 ml  Net 1530.04 ml    Filed Weights   01/24/21 1805  Weight: 89.3 kg    Examination:  General: A/O x4 No acute respiratory distress Eyes: negative scleral hemorrhage, negative anisocoria, negative  icterus ENT: Negative Runny nose, negative gingival bleeding, Neck:  Negative scars, masses, torticollis, lymphadenopathy, JVD Lungs: Clear to auscultation bilaterally without wheezes or crackles Cardiovascular: Regular rate and rhythm without murmur gallop or rub normal S1 and S2 Abdomen: negative abdominal pain, nondistended, positive soft, bowel sounds, no rebound, no ascites, no appreciable mass.  Patient has a pill camera currently transmitting pictures of his GI tract in place. Extremities: No significant cyanosis, clubbing, or edema bilateral lower extremities Skin: Negative rashes, lesions, ulcers Psychiatric:  Negative depression, negative anxiety, negative fatigue, negative mania  Central nervous system:  Cranial nerves II through XII intact, tongue/uvula midline, all extremities muscle strength 5/5, sensation intact throughout, negative dysarthria, negative expressive aphasia, negative receptive aphasia.  .     Data Reviewed: Care during the described time interval was provided by me .  I have reviewed this patient's available data, including medical history, events of note, physical examination, and all test results as part of my evaluation.  CBC: Recent Labs  Lab 01/24/21 1829 01/24/21 1845 01/25/21 0245 01/25/21 1324 01/26/21 0247 01/27/21 0748  WBC 11.4*  --  8.2  --  8.1 6.3  NEUTROABS 9.3*  --   --   --  6.4  --   HGB 5.0* 5.1* 5.6* 7.5* 8.5* 8.9*  HCT 16.2* 15.0* 17.9* 22.7* 25.4* 27.6*  MCV 103.8*  --  101.7*  --  91.7 94.8  PLT 232  --  214  --  205 332    Basic Metabolic Panel: Recent Labs  Lab 01/24/21 1829 01/24/21 1845 01/25/21 0245 01/26/21 0247 01/27/21 0748  NA 135 136 136 135 139  K 3.6 3.6 3.7 3.5 3.5  CL 99  --  100 100 104  CO2 26  --  26 27 27   GLUCOSE 131*  --  118* 96 96  BUN 47*  --  43* 25* 15  CREATININE 1.82*  --  1.77* 1.44* 1.34*  CALCIUM 8.5*  --  8.1* 8.2* 8.5*    GFR: Estimated Creatinine Clearance: 55.5 mL/min (A) (by  C-G formula based on SCr of 1.34 mg/dL (H)). Liver Function Tests: Recent Labs  Lab 01/24/21 1829  AST 17  ALT 15  ALKPHOS 64  BILITOT 0.7  PROT 5.9*  ALBUMIN 3.1*    No results for input(s): LIPASE, AMYLASE in the last 168 hours. No results for input(s): AMMONIA  in the last 168 hours. Coagulation Profile: No results for input(s): INR, PROTIME in the last 168 hours. Cardiac Enzymes: No results for input(s): CKTOTAL, CKMB, CKMBINDEX, TROPONINI in the last 168 hours. BNP (last 3 results) No results for input(s): PROBNP in the last 8760 hours. HbA1C: Recent Labs    01/25/21 0245  HGBA1C 4.4*    CBG: Recent Labs  Lab 01/26/21 1628 01/26/21 2042 01/27/21 0712 01/27/21 1151 01/27/21 1739  GLUCAP 81 100* 111* 76 179*    Lipid Profile: No results for input(s): CHOL, HDL, LDLCALC, TRIG, CHOLHDL, LDLDIRECT in the last 72 hours. Thyroid Function Tests: No results for input(s): TSH, T4TOTAL, FREET4, T3FREE, THYROIDAB in the last 72 hours. Anemia Panel: No results for input(s): VITAMINB12, FOLATE, FERRITIN, TIBC, IRON, RETICCTPCT in the last 72 hours. Sepsis Labs: Recent Labs  Lab 01/24/21 1829 01/25/21 0245  LATICACIDVEN 1.6 0.7     Recent Results (from the past 240 hour(s))  Urine culture     Status: Abnormal   Collection Time: 01/24/21  6:29 PM   Specimen: Urine, Random  Result Value Ref Range Status   Specimen Description URINE, RANDOM  Final   Special Requests   Final    NONE Performed at [facility], [place] [address], [place], [place] [address]    Culture (A)  Final    70,000 COLONIES/mL ESCHERICHIA COLI Confirmed Extended Spectrum Beta-Lactamase Producer (ESBL).  In bloodstream infections from ESBL organisms, carbapenems are preferred over piperacillin/tazobactam. They are shown to have a lower risk of mortality.    Report Status 01/27/2021 FINAL  Final   Organism ID, Bacteria ESCHERICHIA COLI (A)  Final      Susceptibility   Escherichia coli -  MIC*    AMPICILLIN >=32 RESISTANT Resistant     CEFAZOLIN >=64 RESISTANT Resistant     CEFEPIME 16 RESISTANT Resistant     CEFTRIAXONE >=64 RESISTANT Resistant     CIPROFLOXACIN >=4 RESISTANT Resistant     GENTAMICIN 2 SENSITIVE Sensitive     IMIPENEM <=0.25 SENSITIVE Sensitive     NITROFURANTOIN <=16 SENSITIVE Sensitive     TRIMETH/SULFA >=320 RESISTANT Resistant     AMPICILLIN/SULBACTAM >=32 RESISTANT Resistant     PIP/TAZO 8 SENSITIVE Sensitive     * 70,000 COLONIES/mL ESCHERICHIA COLI  Blood culture (routine x 2)     Status: None (Preliminary result)   Collection Time: 01/24/21  6:29 PM   Specimen: BLOOD  Result Value Ref Range Status   Specimen Description BLOOD LEFT ANTECUBITAL  Final   Special Requests   Final    BOTTLES DRAWN AEROBIC AND ANAEROBIC Blood Culture results may not be optimal due to an excessive volume of blood received in culture bottles   Culture   Final    NO GROWTH 3 DAYS Performed at [facility], [address], [place], [place] [address]    Report Status PENDING  Incomplete  Resp Panel by RT-PCR (Flu A&B, Covid) Nasopharyngeal Swab     Status: None   Collection Time: 01/24/21  6:29 PM   Specimen: Nasopharyngeal Swab; Nasopharyngeal(NP) swabs in vial transport medium  Result Value Ref Range Status   SARS Coronavirus 2 by RT PCR NEGATIVE NEGATIVE Final    Comment: (NOTE) SARS-CoV-2 target nucleic acids are NOT DETECTED.  The SARS-CoV-2 RNA is generally detectable in upper respiratory specimens during the acute phase of infection. The lowest concentration of SARS-CoV-2 viral copies this assay can detect is 138 copies/mL. A negative result does not  preclude SARS-Cov-2 infection and should not be used as the sole basis for treatment or other patient management decisions. A negative result may occur with  improper specimen collection/handling, submission of specimen other than nasopharyngeal swab, presence of viral mutation(s) within the areas  targeted by this assay, and inadequate number of viral copies(<138 copies/mL). A negative result must be combined with clinical observations, patient history, and epidemiological information. The expected result is Negative.  Fact Sheet for Patients:  EntrepreneurPulse.com.au  Fact Sheet for Healthcare Providers:  IncredibleEmployment.be  This test is no t yet approved or cleared by the [place] FDA and  has been authorized for detection and/or diagnosis of SARS-CoV-2 by FDA under an Emergency Use Authorization (EUA). This EUA will remain  in effect (meaning this test can be used) for the duration of the COVID-19 declaration under Section 564(b)(1) of the Act, 21 U.S.C.section 360bbb-3(b)(1), unless the authorization is terminated  or revoked sooner.       Influenza A by PCR NEGATIVE NEGATIVE Final   Influenza B by PCR NEGATIVE NEGATIVE Final    Comment: (NOTE) The Xpert Xpress SARS-CoV-2/FLU/RSV plus assay is intended as an aid in the diagnosis of influenza from Nasopharyngeal swab specimens and should not be used as a sole basis for treatment. Nasal washings and aspirates are unacceptable for Xpert Xpress SARS-CoV-2/FLU/RSV testing.  Fact Sheet for Patients: EntrepreneurPulse.com.au  Fact Sheet for Healthcare Providers: IncredibleEmployment.be  This test is not yet approved or cleared by the [place] FDA and has been authorized for detection and/or diagnosis of SARS-CoV-2 by FDA under an Emergency Use Authorization (EUA). This EUA will remain in effect (meaning this test can be used) for the duration of the COVID-19 declaration under Section 564(b)(1) of the Act, 21 U.S.C. section 360bbb-3(b)(1), unless the authorization is terminated or revoked.  Performed at [facility], [place] [address], [place], [place] [address]   Blood culture (routine x 2)     Status: None (Preliminary result)    Collection Time: 01/24/21  9:10 PM   Specimen: BLOOD  Result Value Ref Range Status   Specimen Description BLOOD RIGHT ARM  Final   Special Requests   Final    BOTTLES DRAWN AEROBIC AND ANAEROBIC Blood Culture adequate volume   Culture   Final    NO GROWTH 3 DAYS Performed at [facility], [place] [address], [place], [place] [address]    Report Status PENDING  Incomplete  Culture, blood (single)     Status: None (Preliminary result)   Collection Time: 01/24/21  9:30 PM   Specimen: BLOOD  Result Value Ref Range Status   Specimen Description BLOOD SITE NOT SPECIFIED  Final   Special Requests   Final    BOTTLES DRAWN AEROBIC AND ANAEROBIC Blood Culture results may not be optimal due to an inadequate volume of blood received in culture bottles   Culture   Final    NO GROWTH 2 DAYS Performed at [facility], [place] [address] [place], [place] [address]    Report Status PENDING  Incomplete          Radiology Studies: DG CHEST PORT 1 VIEW  Result Date: 01/25/2021 CLINICAL DATA:  Dyspnea EXAM: PORTABLE CHEST 1 VIEW COMPARISON:  01/24/2021 FINDINGS: Postoperative changes in the mediastinum. Aortic stent. Surgical clips in the right axilla. Cardiac enlargement. Mild vascular congestion. Bilateral perihilar and basilar infiltrates with bilateral pleural effusions. Appearances are similar to prior study. No pneumothorax. IMPRESSION: Cardiac enlargement, pulmonary  vascular congestion, bilateral pulmonary infiltrates and effusions without significant change. Electronically Signed   By: Lucienne Capers M.D.   On: 01/25/2021 21:31        Scheduled Meds:  ALPRAZolam  1 mg Oral QHS   escitalopram  10 mg Oral Daily   insulin aspart  0-9 Units Subcutaneous TID WC   ipratropium-albuterol  3 mL Nebulization QID   metoprolol tartrate  12.5 mg Oral BID   pantoprazole (PROTONIX) IV  40 mg Intravenous Q12H   rosuvastatin  5 mg Oral Daily   Continuous Infusions:  meropenem (MERREM)  IV Stopped (01/27/21 1808)     LOS: 3 days    Time spent:40 min    Hazim Treadway, Geraldo Docker, MD [facility]   If 7PM-7AM, please contact night-coverage 01/27/2021, 8:57 PM

## 2021-01-27 NOTE — Transfer of Care (Signed)
Immediate Anesthesia Transfer of Care Note  Patient: Sean Riley  Procedure(s) Performed: COLONOSCOPY ESOPHAGOGASTRODUODENOSCOPY (EGD) POLYPECTOMY  Patient Location: Endoscopy Unit  Anesthesia Type:MAC  Level of Consciousness: awake and drowsy  Airway & Oxygen Therapy: Patient Spontanous Breathing and Patient connected to nasal cannula oxygen  Post-op Assessment: Report given to RN, Post -op Vital signs reviewed and stable and Patient moving all extremities X 4  Post vital signs: Reviewed and stable  Last Vitals:  Vitals Value Taken Time  BP    Temp    Pulse 83 01/27/21 1059  Resp 21 01/27/21 1059  SpO2 94 % 01/27/21 1059  Vitals shown include unvalidated device data.  Last Pain:  Vitals:   01/27/21 0908  TempSrc: Oral  PainSc: 0-No pain      Patients Stated Pain Goal: 0 (35/57/32 2025)  Complications: No notable events documented.

## 2021-01-27 NOTE — Anesthesia Procedure Notes (Signed)
Procedure Name: MAC Date/Time: 01/27/2021 10:20 AM Performed by: Harden Mo, CRNA Pre-anesthesia Checklist: Patient identified, Emergency Drugs available, Suction available and Patient being monitored Patient Re-evaluated:Patient Re-evaluated prior to induction Oxygen Delivery Method: Nasal cannula Preoxygenation: Pre-oxygenation with 100% oxygen Induction Type: IV induction Placement Confirmation: positive ETCO2 and breath sounds checked- equal and bilateral Dental Injury: Teeth and Oropharynx as per pre-operative assessment

## 2021-01-27 NOTE — Progress Notes (Signed)
Patient swallowed pill cam at 1213. Diet information given to patient, family members at bedside and bedside RN. Advised bedside RN to removed equipment after midnight and will pick back up equipment in the morning of 6/21.

## 2021-01-27 NOTE — Plan of Care (Signed)
  Problem: Clinical Measurements: Goal: Diagnostic test results will improve Outcome: Progressing   Problem: Activity: Goal: Risk for activity intolerance will decrease Outcome: Progressing   Problem: Elimination: Goal: Will not experience complications related to bowel motility Outcome: Progressing

## 2021-01-27 NOTE — Anesthesia Preprocedure Evaluation (Signed)
Anesthesia Evaluation  Patient identified by MRN, date of birth, ID band Patient awake    Reviewed: Allergy & Precautions, H&P , NPO status , Patient's Chart, lab work & pertinent test results  History of Anesthesia Complications Negative for: history of anesthetic complications  Airway Mallampati: II  TM Distance: >3 FB Neck ROM: full    Dental  (+) Dental Advisory Given   Pulmonary COPD,  oxygen dependent, former smoker,    breath sounds clear to auscultation       Cardiovascular hypertension, Pt. on medications and Pt. on home beta blockers + Peripheral Vascular Disease (s/p Bentall)  + Valvular Problems/Murmurs (s/p AVR)  Rhythm:irregular Rate:Normal  S/p Bentall aortic root replacement and AVR (2018)   Neuro/Psych PSYCHIATRIC DISORDERS Depression negative neurological ROS     GI/Hepatic Neg liver ROS, GERD  Medicated and Controlled,  Endo/Other  negative endocrine ROS  Renal/GU negative Renal ROS     Musculoskeletal  (+) Arthritis ,   Abdominal   Peds  Hematology eliquis   Anesthesia Other Findings   Reproductive/Obstetrics                             Anesthesia Physical  Anesthesia Plan  ASA: III  Anesthesia Plan: MAC   Post-op Pain Management:    Induction: Intravenous  PONV Risk Score and Plan: 1 and Propofol infusion and Treatment may vary due to age or medical condition  Airway Management Planned: Nasal Cannula  Additional Equipment: None  Intra-op Plan:   Post-operative Plan:   Informed Consent: I have reviewed the patients History and Physical, chart, labs and discussed the procedure including the risks, benefits and alternatives for the proposed anesthesia with the patient or authorized representative who has indicated his/her understanding and acceptance.     Dental advisory given  Plan Discussed with: CRNA, Anesthesiologist and Surgeon  Anesthesia Plan  Comments:         Anesthesia Quick Evaluation

## 2021-01-27 NOTE — Anesthesia Postprocedure Evaluation (Signed)
Anesthesia Post Note  Patient: Sean Riley  Procedure(s) Performed: COLONOSCOPY ESOPHAGOGASTRODUODENOSCOPY (EGD) POLYPECTOMY     Patient location during evaluation: Endoscopy Anesthesia Type: MAC Level of consciousness: awake and alert Pain management: pain level controlled Vital Signs Assessment: post-procedure vital signs reviewed and stable Respiratory status: spontaneous breathing, nonlabored ventilation, respiratory function stable and patient connected to nasal cannula oxygen Cardiovascular status: blood pressure returned to baseline and stable Postop Assessment: no apparent nausea or vomiting Anesthetic complications: no   No notable events documented.  Last Vitals:  Vitals:   01/27/21 1109 01/27/21 1118  BP: (!) 126/52 (!) 129/51  Pulse: 81 78  Resp: (!) 26 (!) 24  Temp:    SpO2: 100% 98%    Last Pain:  Vitals:   01/27/21 1118  TempSrc:   PainSc: 0-No pain                 Lynda Rainwater

## 2021-01-27 NOTE — Progress Notes (Signed)
RN was notified  by wife that patient's HR was fluctuating 90-114 beats and with occasional 120+ beats/min. Wife watching the continuous pulse oximeter with  HR showing in the monitor. Family constantly watching HR showing a Sinus Tachy but patient seem to be more anxious knowing his HR is going over the 100. Tried to encourage patient do some deep breathing exercise but seems reluctant.  MD verbally was notified of patient 's HR  over the 100, and knows that it is in Sinus Tachycardia. No orders given, as patient is also on a timed capsule from GI .  Explained to the wife about the sinus Tachy and beside that,patient is on heart monitor and if there is anything abnormal in his heart rate and rhythm , we will be notified and we will be informing the MD about any abnormality.

## 2021-01-28 ENCOUNTER — Ambulatory Visit (HOSPITAL_COMMUNITY): Payer: Medicare Other | Admitting: Physician Assistant

## 2021-01-28 DIAGNOSIS — R195 Other fecal abnormalities: Secondary | ICD-10-CM

## 2021-01-28 LAB — CBC WITH DIFFERENTIAL/PLATELET
Abs Immature Granulocytes: 0.03 10*3/uL (ref 0.00–0.07)
Basophils Absolute: 0 10*3/uL (ref 0.0–0.1)
Basophils Relative: 0 %
Eosinophils Absolute: 0.4 10*3/uL (ref 0.0–0.5)
Eosinophils Relative: 5 %
HCT: 28.7 % — ABNORMAL LOW (ref 39.0–52.0)
Hemoglobin: 9 g/dL — ABNORMAL LOW (ref 13.0–17.0)
Immature Granulocytes: 0 %
Lymphocytes Relative: 6 %
Lymphs Abs: 0.4 10*3/uL — ABNORMAL LOW (ref 0.7–4.0)
MCH: 29.9 pg (ref 26.0–34.0)
MCHC: 31.4 g/dL (ref 30.0–36.0)
MCV: 95.3 fL (ref 80.0–100.0)
Monocytes Absolute: 0.6 10*3/uL (ref 0.1–1.0)
Monocytes Relative: 9 %
Neutro Abs: 5.4 10*3/uL (ref 1.7–7.7)
Neutrophils Relative %: 80 %
Platelets: 227 10*3/uL (ref 150–400)
RBC: 3.01 MIL/uL — ABNORMAL LOW (ref 4.22–5.81)
RDW: 18.1 % — ABNORMAL HIGH (ref 11.5–15.5)
WBC: 6.9 10*3/uL (ref 4.0–10.5)
nRBC: 0 % (ref 0.0–0.2)

## 2021-01-28 LAB — COMPREHENSIVE METABOLIC PANEL
ALT: 14 U/L (ref 0–44)
AST: 19 U/L (ref 15–41)
Albumin: 2.9 g/dL — ABNORMAL LOW (ref 3.5–5.0)
Alkaline Phosphatase: 65 U/L (ref 38–126)
Anion gap: 8 (ref 5–15)
BUN: 11 mg/dL (ref 8–23)
CO2: 24 mmol/L (ref 22–32)
Calcium: 8.3 mg/dL — ABNORMAL LOW (ref 8.9–10.3)
Chloride: 101 mmol/L (ref 98–111)
Creatinine, Ser: 1.23 mg/dL (ref 0.61–1.24)
GFR, Estimated: >60 mL/min (ref >60)
Glucose, Bld: 155 mg/dL — ABNORMAL HIGH (ref 70–99)
Potassium: 3.4 mmol/L — ABNORMAL LOW (ref 3.5–5.1)
Sodium: 133 mmol/L — ABNORMAL LOW (ref 135–145)
Total Bilirubin: 1.1 mg/dL (ref 0.3–1.2)
Total Protein: 5.8 g/dL — ABNORMAL LOW (ref 6.5–8.1)

## 2021-01-28 LAB — PHOSPHORUS: Phosphorus: 3.4 mg/dL (ref 2.5–4.6)

## 2021-01-28 LAB — GLUCOSE, CAPILLARY
Glucose-Capillary: 120 mg/dL — ABNORMAL HIGH (ref 70–99)
Glucose-Capillary: 91 mg/dL (ref 70–99)

## 2021-01-28 LAB — MAGNESIUM: Magnesium: 2.1 mg/dL (ref 1.7–2.4)

## 2021-01-28 LAB — VITAMIN B12: Vitamin B-12: 275 pg/mL (ref 180–914)

## 2021-01-28 LAB — SURGICAL PATHOLOGY

## 2021-01-28 MED ORDER — IPRATROPIUM-ALBUTEROL 0.5-2.5 (3) MG/3ML IN SOLN: 3.0000 mL | Freq: Two times a day (BID) | RESPIRATORY_TRACT | Status: DC

## 2021-01-28 MED ORDER — POTASSIUM CHLORIDE CRYS ER 20 MEQ PO TBCR
40.0000 meq | EXTENDED_RELEASE_TABLET | Freq: Once | ORAL | Status: AC
  Administered 2021-01-29: 40 meq via ORAL
  Filled 2021-01-28: qty 2

## 2021-01-28 MED ORDER — IPRATROPIUM-ALBUTEROL 0.5-2.5 (3) MG/3ML IN SOLN
3.0000 mL | Freq: Three times a day (TID) | RESPIRATORY_TRACT | Status: DC
  Administered 2021-01-28: 3 mL via RESPIRATORY_TRACT
  Filled 2021-01-28: qty 3

## 2021-01-28 MED ORDER — SODIUM CHLORIDE 0.9 % IV SOLN
INTRAVENOUS | Status: DC | PRN
  Administered 2021-01-28 – 2021-01-30 (×2): 1000 mL via INTRAVENOUS

## 2021-01-28 MED ORDER — APIXABAN 5 MG PO TABS
5.0000 mg | ORAL_TABLET | Freq: Two times a day (BID) | ORAL | Status: DC
  Administered 2021-01-28 – 2021-01-31 (×7): 5 mg via ORAL
  Filled 2021-01-28 (×7): qty 1

## 2021-01-28 MED ORDER — FLECAINIDE ACETATE 50 MG PO TABS
75.0000 mg | ORAL_TABLET | Freq: Two times a day (BID) | ORAL | Status: DC
  Administered 2021-01-28 – 2021-01-31 (×7): 75 mg via ORAL
  Filled 2021-01-28 (×8): qty 2

## 2021-01-28 NOTE — Progress Notes (Signed)
Patient had a 12 beat run of non sustained VTach. Patient asymptomatic, denies any chest pain. BP 133/84 HR 86.Opyd,MD notified via secure chat. Order received.

## 2021-01-28 NOTE — Plan of Care (Signed)
  Problem: Clinical Measurements: Goal: Ability to maintain clinical measurements within normal limits will improve Outcome: Progressing Goal: Diagnostic test results will improve Outcome: Progressing Goal: Respiratory complications will improve Outcome: Progressing Goal: Cardiovascular complication will be avoided Outcome: Progressing   Problem: Activity: Goal: Risk for activity intolerance will decrease Outcome: Progressing   Problem: Coping: Goal: Level of anxiety will decrease Outcome: Progressing   Problem: Elimination: Goal: Will not experience complications related to bowel motility Outcome: Progressing Goal: Will not experience complications related to urinary retention Outcome: Progressing   Problem: Education: Goal: Ability to identify signs and symptoms of gastrointestinal bleeding will improve Outcome: Progressing   Problem: Clinical Measurements: Goal: Complications related to the disease process, condition or treatment will be avoided or minimized Outcome: Progressing

## 2021-01-28 NOTE — TOC Initial Note (Signed)
Transition of Care [facility]) - Initial/Assessment Note    Patient Details  Name: Sean Riley MRN: [id number] Date of Birth: 29-Mar-1951  [facility] CM/SW Contact:    Carles Collet, RN Phone Number: 01/28/2021, 12:06 PM  Clinical Narrative:           Damaris Schooner w patient and wife at bedside. Plan will be for DC to home w [facility] services. They identified [facility] from previous use and requested this agency. Referral placed and accepted. Requested [place] orders from MD for PT OT. Patient has O2 w [employer] as well as portable innogen oxygen they report goes up to 5L. Wife will go home and get innogen and provide transportation home on day of DC.  Patient also has RW, cane, 3/1 at home.          Expected Discharge Plan: [place] Barriers to Discharge: Continued Medical Work up   Patient Goals and CMS Choice Patient states their goals for this hospitalization and ongoing recovery are:: to return home CMS Medicare.gov Compare Post Acute Care list provided to:: Patient Choice offered to / list presented to : Patient, Spouse  Expected Discharge Plan and Services Expected Discharge Plan: [place]   Discharge Planning Services: CM Consult Post Acute Care Choice: Home Health Living arrangements for the past 2 months: Single Family Home                 DME Arranged: N/A         HH Arranged: PT, OT HH Agency: [place] [facility]) Date HH Agency Contacted: 01/28/21 Time HH Agency Contacted: 1205 Representative spoke with at [place]: [name]  Prior Living Arrangements/Services Living arrangements for the past 2 months: [place] with:: Spouse              Current home services: DME    Activities of Daily Living Home Assistive Devices/Equipment: Eyeglasses, Oxygen, Nebulizer, Grab bars in shower ADL Screening (condition at time of admission) Patient's cognitive ability adequate to safely complete daily activities?:  Yes Is the patient deaf or have difficulty hearing?: No Does the patient have difficulty seeing, even when wearing glasses/contacts?: No Does the patient have difficulty concentrating, remembering, or making decisions?: No Patient able to express need for assistance with ADLs?: Yes Does the patient have difficulty dressing or bathing?: No Independently performs ADLs?: Yes (appropriate for developmental age) Does the patient have difficulty walking or climbing stairs?: Yes Weakness of Legs: Both Weakness of Arms/Hands: None  Permission Sought/Granted                  Emotional Assessment              Admission diagnosis:  Hemorrhagic shock ([place]) [R57.8] Acute respiratory failure with hypoxia ([place]) [J96.01] Symptomatic anemia [D64.9] Gastrointestinal hemorrhage, unspecified gastrointestinal hemorrhage type [K92.2] Patient Active Problem List   Diagnosis Date Noted   Heme + stool    UTI due to extended-spectrum beta lactamase (ESBL) producing Escherichia coli 01/27/2021   Chronic diastolic CHF (congestive heart failure) ([place]) 01/27/2021   Acute GI bleeding 01/27/2021   Symptomatic anemia 01/24/2021   Acute on chronic respiratory failure with hypoxia ([place]) 01/24/2021   GI bleed 01/24/2021   Chest pain 01/24/2021   Leukocytosis 01/24/2021   CKD (chronic kidney disease) 01/24/2021   Acute respiratory failure with hypoxia ([place]) 09/06/2020   Secondary hypercoagulable state (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   Elevated PSA 01/11/2016   PCP NOTES >>>>>>> 07/24/2015    Gynecomastia, male 10/10/2014   Allergic rhinitis 09/10/2014   Dermatitis 05/11/2014   Annual physical exam 05/10/2014   Erectile dysfunction 05/10/2014   HTN (hypertension) 10/28/2013   Bronchospasm 10/28/2013   ETOH abuse 10/28/2013   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 [facility] [place] [address] Phone: [phone] Fax: [phone]     Social Determinants of Health (SDOH) Interventions    Readmission Risk Interventions No flowsheet data found.

## 2021-01-28 NOTE — Procedures (Signed)
Indication: Heme positive anemia question of melena  Background unrevealing EGD and colonoscopy for this work-up   Findings some scattered phlebectasia but normal study no cause of heme positive stool question of melena and anemia   Recommendations consider CT abdomen and pelvis looking for undiagnosed hematoma, we have not explained his significant decrease in hemoglobin unless there was some sort of transient bleeding from a lesion not seen on this work-up.   No further GI work-up at this time.  We are signing off

## 2021-01-28 NOTE — Plan of Care (Signed)
?  Problem: Clinical Measurements: ?Goal: Ability to maintain clinical measurements within normal limits will improve ?Outcome: Progressing ?Goal: Will remain free from infection ?Outcome: Progressing ?Goal: Diagnostic test results will improve ?Outcome: Progressing ?  ?

## 2021-01-28 NOTE — Progress Notes (Signed)
PROGRESS NOTE    Sean Riley  GUR:[id number] DOB: 1950/10/18 DOA: 01/24/2021 PCP: Colon Branch, MD     Brief Narrative:  70 y.o. WM PMHx  persistent A. 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 Also endorses generalized weakness. Reports this has been present for 5 days, gradually worsening. Is on 3L O2 by [place] at home for COPD, had to increase this amount and then wife turned back down when O2 saturations stabilized. Per EMS, he was hypoxic in mid 80s on 3L. Chest pain is left sided in nature. Has no diaphoresis, arm pain, jaw claudication.   Notes that he had a bowel movement earlier this week that was black and tarry. He has not had BM since. No hematochezia. No hematemesis. No hemoptysis. No abdominal pain or heartburn symptoms. Has been taking Eliquis.      ED work-up/course:  Patient is a 70 year old male with complicated past medical history including atrial fibrillation anticoagulated on Eliquis, coronary artery disease, COPD, CHF, previous aortic dissection status postrepair in 2018 who presented to the emergency department via EMS with worsening left-sided chest pain (present over the last 5 days worsening about 2 hours prior to arrival), shortness of breath and found to be hypoxic into the mid 80s on his baseline 3 L nasal cannula. Emergency department he was ill-appearing but nontoxic. Borderline hypotensive.  Received 500 of crystalloid by EMS with improvement.  Afebrile on oral check however will check rectal temp. He is on 5 L nasal cannula with oxygen saturations fluctuating in the low to mid 90s. Active cough during my exam.  I am concerned for pneumonia. Chest x-ray ordered. Also highly considering ACS. EKG as above -atrial fibrillation with nonspecific ST-T wave changes. Ordered for cardiac biomarkers as well as BNP.  Please  see ED clinical course above for further medical decision making.   ED course: Patient is critically ill for the following reasons: Acute blood loss anemia causing hemorrhagic shock, acute hypoxic respiratory failure likely in the setting of poor perfusion and global ischemia, unstable angina. Impression is likely symptomatic anemia, likely GI loss in the setting of systemic anticoagulation.  No hematemesis. I ordered for a dose of IV Protonix and Rocephin. Patient getting blood transfused. Remained hemodynamically stable after crystalloid transfusion.  Admitted to medicine for further treatment.   Subjective: A/O x4, negative abdominal pain, negative nausea, negative nausea vomiting.  Patient in the middle of a capsule study.   Assessment & Plan: Covid vaccination;   Active Problems:   Hyperlipidemia   tobacco use d/o   Atrial fibrillation (HCC)   BPH (benign prostatic hyperplasia)   RBBB   HTN (hypertension)   S/P aortic dissection repair   COPD (chronic obstructive pulmonary disease) (HCC)   Atrial tachycardia (HCC)   Symptomatic anemia   GI bleed   Chest pain   Leukocytosis   CKD (chronic kidney disease)   UTI due to extended-spectrum beta lactamase (ESBL) producing Escherichia coli   Chronic diastolic CHF (congestive heart failure) (HCC)   Acute GI bleeding   Heme + stool  Symptomatic Anemia with GI Bleed -Patient presenting with chest pain, SOB, dizziness. Found to be hypoxic to mid 80s on baseline 3L [place].  -Received 500 cc crystalloid by EMS. HgB 5.0 at presentation. Initially was mildly hypotensive, now hemodynamically stable.  -Received additional 500 cc NS bolus in  ED.  -Remote history of Fe deficiency anemia, not currently on supplement.  -Is on anticoagulation for Afib.  -Notes melena at home. FOBT positive in ED.  -Colonoscopy in 2017 with precancerous polyps; was recommended to repeat in 3 years. Has not had repeat colonoscopy since.  -6/17 transfuse 1 unit  PRBC - 6/18 transfuse 3 units PRBC  -6/20 s/p colonoscopy/EGD: See findings below -6/20 capsule study ongoing: Swallow pill cam at 1213 -6/21 per GI note negative sites of bleeding identified on colonoscopy, EGD, or capsule study. Lab Results  Component Value Date   HGB 9.0 (L) 01/28/2021   HGB 8.9 (L) 01/27/2021   HGB 8.5 (L) 01/26/2021   HGB 7.5 (L) 01/25/2021   HGB 5.6 (LL) 01/25/2021  -Stable -6/21 EGD, colonoscopy, capsule study unrevealing for GI bleed see results below.  No further work-up at this time.    Chest Pain -Suspect related to anemia. Troponin normal. EKG does not show any signs of STEMI.  -Resolved   Chronic Diastolic CHF Appears to be hypovolemic at presentation. Last echo EF 60-65% with right ventricular function mildly reduced. -monitor volume status as receives fluids and blood products -strict I/Os +1.2 L -daily weights Filed Weights   01/24/21 1805 01/28/21 0955  Weight: 89.3 kg 91.1 kg  -hold Torsemide    Afib -Seen on EKG at admit. HR is mildly elevated, likely related to anemia. --6/21 restart Eliquis 5 mg  BID -Metoprolol 12.5 mg BID - 6/21 Flecainide 75 mg BID -6/21 spoke at length with wife and patient concerning warning signs to watch out for since we are going to restart Eliquis.  If patient bleeds again we will have to decide in conjunction with cardiologist on/ if to stop permanently Eliquis.   COPD -Acute respiratory failure with hypoxia resolved - Titrate O2 to maintain SPO2 > 92% - Not on long-term controller inhaler for COPD - Acute on chronic respiratory failure related to above needs to be addressed for long-term management of her respiratory status -Patient requires follow-up as an outpatient spirometry, DLCO.   Leukocytosis -WBC 11.4. Had leukocytosis in Jan 2022 to 12 at admission with no clear etiology.  Lactic acid 1.6. Covid and flu neg. -UA see UTI ESBL positive UTI - 6/20 discussed case with pharmacy recommended Meropenem  1 g q 8 hr -6/21 upon discharge will need to discuss with pharmacy what is best medication in order for patient to finish a 5-day course of antibiotics  CKD III Baseline Cr 1.8 Lab Results  Component Value Date   CREATININE 1.23 01/28/2021   CREATININE 1.34 (H) 01/27/2021   CREATININE 1.44 (H) 01/26/2021   CREATININE 1.77 (H) 01/25/2021   CREATININE 1.82 (H) 01/24/2021  -Better than baseline  Impaired Glucose Tolerance -6/18 hemoglobin A1c = 4.4 which does not even meet the guidelines for prediabetes  -On discharge would not restart Jardiance.  Patient can discuss with his when/if to restart      Chronic anxiety/depression -Resume home regimen   Hyperlipidemia -Resume home regimen.  Deconditioning - 6/21 PT/OT consult:Eval for for H/H, PT, OT.   -6/21 PT recommends home health, PT.   Goals of care - We will attempt to discharge patient on 6/21 if all diagnostic modalities can be completed    DVT prophylaxis: SCD (secondary to bleed) Code Status: Full Family Communication: 6/20 wife at bedside for discussion of plan of care all questions answered Status is: Inpatient    Dispo: The patient is from: Home  Anticipated d/c is to: Home              Anticipated d/c date is: 6/21              Patient currently unstable      Consultants:  GI Dr. Silvano Rusk   Procedures/Significant Events:  6/20 s/p colonoscopy:Three diminutive polyps in the descending colon and in the transverse colon, removed with a cold snare. Resected and retrieved. - Diverticulosis in the sigmoid colon and in the descending colon. - Non-bleeding internal hemorrhoids. - The examination was otherwise normal on direct and retroflexion views. 6/20 s/p EGD - Normal esophagus. - Normal stomach. - Normal examined duodenum. - No specimens collected. 6/20 PillCam swallowed at 1213:Findings some scattered phlebectasia but normal study no cause of heme positive stool question of melena and  anemia  I have personally reviewed and interpreted all radiology studies and my findings are as above.  VENTILATOR SETTINGS:    Cultures   Antimicrobials: Anti-infectives (From admission, onward)    Start     Ordered Stop   01/27/21 1615  meropenem (MERREM) 1 g in sodium chloride 0.9 % 100 mL IVPB        01/27/21 1547     01/24/21 2100  cefTRIAXone (ROCEPHIN) 2 g in sodium chloride 0.9 % 100 mL IVPB        01/24/21 2057 01/24/21 2232         Devices    LINES / TUBES:      Continuous Infusions:  meropenem (MERREM) IV Stopped (01/28/21 0920)     Objective: Vitals:   01/27/21 2355 01/28/21 0256 01/28/21 0541 01/28/21 0955  BP: (!) 110/51 114/70 (!) 112/57 117/69  Pulse: (!) 107 83 76 (!) 106  Resp: 18 20 14 17   Temp: 98 F (36.7 C) 98.6 F (37 C) 98.2 F (36.8 C) 98.1 F (36.7 C)  TempSrc: Oral Oral Oral Oral  SpO2: 92% 92% 92% 94%  Weight:    91.1 kg  Height:        Intake/Output Summary (Last 24 hours) at 01/28/2021 1103 Last data filed at 01/28/2021 0946 Gross per 24 hour  Intake 1260 ml  Output 600 ml  Net 660 ml    Filed Weights   01/24/21 1805 01/28/21 0955  Weight: 89.3 kg 91.1 kg    Examination:  General: A/O x4 No acute respiratory distress Eyes: negative scleral hemorrhage, negative anisocoria, negative icterus ENT: Negative Runny nose, negative gingival bleeding, Neck:  Negative scars, masses, torticollis, lymphadenopathy, JVD Lungs: Clear to auscultation bilaterally without wheezes or crackles Cardiovascular: Regular rate and rhythm without murmur gallop or rub normal S1 and S2 Abdomen: negative abdominal pain, nondistended, positive soft, bowel sounds, no rebound, no ascites, no appreciable mass.  Patient has a pill camera currently transmitting pictures of his GI tract in place. Extremities: No significant cyanosis, clubbing, or edema bilateral lower extremities Skin: Negative rashes, lesions, ulcers Psychiatric:  Negative  depression, negative anxiety, negative fatigue, negative mania  Central nervous system:  Cranial nerves II through XII intact, tongue/uvula midline, all extremities muscle strength 5/5, sensation intact throughout, negative dysarthria, negative expressive aphasia, negative receptive aphasia.  .     Data Reviewed: Care during the described time interval was provided by me .  I have reviewed this patient's available data, including medical history, events of note, physical examination, and all test results as part of my evaluation.  CBC: Recent Labs  Lab 01/24/21 1829 01/24/21 1845 01/25/21 0245 01/25/21  1324 01/26/21 0247 01/27/21 0748 01/28/21 0818  WBC 11.4*  --  8.2  --  8.1 6.3 6.9  NEUTROABS 9.3*  --   --   --  6.4  --  5.4  HGB 5.0*   < > 5.6* 7.5* 8.5* 8.9* 9.0*  HCT 16.2*   < > 17.9* 22.7* 25.4* 27.6* 28.7*  MCV 103.8*  --  101.7*  --  91.7 94.8 95.3  PLT 232  --  214  --  205 225 227   < > = values in this interval not displayed.    Basic Metabolic Panel: Recent Labs  Lab 01/24/21 1829 01/24/21 1845 01/25/21 0245 01/26/21 0247 01/27/21 0748 01/28/21 0818  NA 135 136 136 135 139 133*  K 3.6 3.6 3.7 3.5 3.5 3.4*  CL 99  --  100 100 104 101  CO2 26  --  26 27 27 24   GLUCOSE 131*  --  118* 96 96 155*  BUN 47*  --  43* 25* 15 11  CREATININE 1.82*  --  1.77* 1.44* 1.34* 1.23  CALCIUM 8.5*  --  8.1* 8.2* 8.5* 8.3*  MG  --   --   --   --   --  2.1  PHOS  --   --   --   --   --  3.4    GFR: Estimated Creatinine Clearance: 60.5 mL/min (by C-G formula based on SCr of 1.23 mg/dL). Liver Function Tests: Recent Labs  Lab 01/24/21 1829 01/28/21 0818  AST 17 19  ALT 15 14  ALKPHOS 64 65  BILITOT 0.7 1.1  PROT 5.9* 5.8*  ALBUMIN 3.1* 2.9*    No results for input(s): LIPASE, AMYLASE in the last 168 hours. No results for input(s): AMMONIA in the last 168 hours. Coagulation Profile: No results for input(s): INR, PROTIME in the last 168 hours. Cardiac  Enzymes: No results for input(s): CKTOTAL, CKMB, CKMBINDEX, TROPONINI in the last 168 hours. BNP (last 3 results) No results for input(s): PROBNP in the last 8760 hours. HbA1C: No results for input(s): HGBA1C in the last 72 hours.  CBG: Recent Labs  Lab 01/27/21 0712 01/27/21 1151 01/27/21 1739 01/28/21 0125 01/28/21 0741  GLUCAP 111* 76 179* 120* 91    Lipid Profile: No results for input(s): CHOL, HDL, LDLCALC, TRIG, CHOLHDL, LDLDIRECT in the last 72 hours. Thyroid Function Tests: No results for input(s): TSH, T4TOTAL, FREET4, T3FREE, THYROIDAB in the last 72 hours. Anemia Panel: Recent Labs    01/28/21 0328  VITAMINB12 275   Sepsis Labs: Recent Labs  Lab 01/24/21 1829 01/25/21 0245  LATICACIDVEN 1.6 0.7     Recent Results (from the past 240 hour(s))  Urine culture     Status: Abnormal   Collection Time: 01/24/21  6:29 PM   Specimen: Urine, Random  Result Value Ref Range Status   Specimen Description URINE, RANDOM  Final   Special Requests   Final    NONE Performed at [facility], [place] [address], [place], [place] [address]    Culture (A)  Final    70,000 COLONIES/mL ESCHERICHIA COLI Confirmed Extended Spectrum Beta-Lactamase Producer (ESBL).  In bloodstream infections from ESBL organisms, carbapenems are preferred over piperacillin/tazobactam. They are shown to have a lower risk of mortality.    Report Status 01/27/2021 FINAL  Final   Organism ID, Bacteria ESCHERICHIA COLI (A)  Final      Susceptibility   Escherichia coli - MIC*    AMPICILLIN >=32  RESISTANT Resistant     CEFAZOLIN >=64 RESISTANT Resistant     CEFEPIME 16 RESISTANT Resistant     CEFTRIAXONE >=64 RESISTANT Resistant     CIPROFLOXACIN >=4 RESISTANT Resistant     GENTAMICIN 2 SENSITIVE Sensitive     IMIPENEM <=0.25 SENSITIVE Sensitive     NITROFURANTOIN <=16 SENSITIVE Sensitive     TRIMETH/SULFA >=320 RESISTANT Resistant     AMPICILLIN/SULBACTAM >=32 RESISTANT Resistant      PIP/TAZO 8 SENSITIVE Sensitive     * 70,000 COLONIES/mL ESCHERICHIA COLI  Blood culture (routine x 2)     Status: None (Preliminary result)   Collection Time: 01/24/21  6:29 PM   Specimen: BLOOD  Result Value Ref Range Status   Specimen Description BLOOD LEFT ANTECUBITAL  Final   Special Requests   Final    BOTTLES DRAWN AEROBIC AND ANAEROBIC Blood Culture results may not be optimal due to an excessive volume of blood received in culture bottles   Culture   Final    NO GROWTH 4 DAYS Performed at [facility] [address], [place], [place] [address]    Report Status PENDING  Incomplete  Resp Panel by RT-PCR (Flu A&B, Covid) Nasopharyngeal Swab     Status: None   Collection Time: 01/24/21  6:29 PM   Specimen: Nasopharyngeal Swab; Nasopharyngeal(NP) swabs in vial transport medium  Result Value Ref Range Status   SARS Coronavirus 2 by RT PCR NEGATIVE NEGATIVE Final    Comment: (NOTE) SARS-CoV-2 target nucleic acids are NOT DETECTED.  The SARS-CoV-2 RNA is generally detectable in upper respiratory specimens during the acute phase of infection. The lowest concentration of SARS-CoV-2 viral copies this assay can detect is 138 copies/mL. A negative result does not preclude SARS-Cov-2 infection and should not be used as the sole basis for treatment or other patient management decisions. A negative result may occur with  improper specimen collection/handling, submission of specimen other than nasopharyngeal swab, presence of viral mutation(s) within the areas targeted by this assay, and inadequate number of viral copies(<138 copies/mL). A negative result must be combined with clinical observations, patient history, and epidemiological information. The expected result is Negative.  Fact Sheet for Patients:  EntrepreneurPulse.com.au  Fact Sheet for Healthcare Providers:  IncredibleEmployment.be  This test is no t yet approved or cleared by the  [place] FDA and  has been authorized for detection and/or diagnosis of SARS-CoV-2 by FDA under an Emergency Use Authorization (EUA). This EUA will remain  in effect (meaning this test can be used) for the duration of the COVID-19 declaration under Section 564(b)(1) of the Act, 21 U.S.C.section 360bbb-3(b)(1), unless the authorization is terminated  or revoked sooner.       Influenza A by PCR NEGATIVE NEGATIVE Final   Influenza B by PCR NEGATIVE NEGATIVE Final    Comment: (NOTE) The Xpert Xpress SARS-CoV-2/FLU/RSV plus assay is intended as an aid in the diagnosis of influenza from Nasopharyngeal swab specimens and should not be used as a sole basis for treatment. Nasal washings and aspirates are unacceptable for Xpert Xpress SARS-CoV-2/FLU/RSV testing.  Fact Sheet for Patients: EntrepreneurPulse.com.au  Fact Sheet for Healthcare Providers: IncredibleEmployment.be  This test is not yet approved or cleared by the [place] FDA and has been authorized for detection and/or diagnosis of SARS-CoV-2 by FDA under an Emergency Use Authorization (EUA). This EUA will remain in effect (meaning this test can be used) for the duration of the COVID-19 declaration under Section 564(b)(1) of the Act,  21 U.S.C. section 360bbb-3(b)(1), unless the authorization is terminated or revoked.  Performed at [facility], [place] [address] [place], [place] [address]   Blood culture (routine x 2)     Status: None (Preliminary result)   Collection Time: 01/24/21  9:10 PM   Specimen: BLOOD  Result Value Ref Range Status   Specimen Description BLOOD RIGHT ARM  Final   Special Requests   Final    BOTTLES DRAWN AEROBIC AND ANAEROBIC Blood Culture adequate volume   Culture   Final    NO GROWTH 4 DAYS Performed at [facility], [place] [address], [place], [place] [address]    Report Status PENDING  Incomplete  Culture, blood (single)     Status: None  (Preliminary result)   Collection Time: 01/24/21  9:30 PM   Specimen: BLOOD  Result Value Ref Range Status   Specimen Description BLOOD SITE NOT SPECIFIED  Final   Special Requests   Final    BOTTLES DRAWN AEROBIC AND ANAEROBIC Blood Culture results may not be optimal due to an inadequate volume of blood received in culture bottles   Culture   Final    NO GROWTH 3 DAYS Performed at [facility], [place] [address], [place], [place] [address]    Report Status PENDING  Incomplete          Radiology Studies: No results found.      Scheduled Meds:  ALPRAZolam  1 mg Oral QHS   apixaban  5 mg Oral BID   escitalopram  10 mg Oral Daily   flecainide  75 mg Oral BID   insulin aspart  0-9 Units Subcutaneous TID WC   ipratropium-albuterol  3 mL Nebulization TID   metoprolol tartrate  12.5 mg Oral BID   pantoprazole (PROTONIX) IV  40 mg Intravenous Q12H   rosuvastatin  5 mg Oral Daily   Continuous Infusions:  meropenem (MERREM) IV Stopped (01/28/21 0920)     LOS: 4 days    Time spent:40 min    Kaleem Sartwell, Geraldo Docker, MD [facility]   If 7PM-7AM, please contact night-coverage 01/28/2021, 11:03 AM

## 2021-01-28 NOTE — Evaluation (Signed)
Physical Therapy Evaluation Patient Details Name: Sean Riley MRN: [id number] DOB: 12-06-1950 Today's Date: 01/28/2021   History of Present Illness  Pt is a 70 y.o. M who presents with sympomatic anemia with GIB. 6/20 s/p colonoscopy with polyp removal and diverticulosis noted in the sigmoid colon and descending colon. Significant PMH: A. 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  Clinical Impression  Prior to admission, pt lives with his spouse, and is a limited household ambulator with cane vs RW. Pt presents with decreased functional mobility secondary to decreased cardiopulmonary endurance, weakness, balance deficits. Upon evaluation, pt requiring min guard assist for mobility and ambulating limited room distances with a RW. Desaturation to 83% on 3L O2 edge of bed; required 5-6L O2 to rebound to 91% with ambulation. Discussed with pt wife and she feels comfortable with providing assist for pt and adjusting oxygen to maintain saturations at home with mobility (they already have a pulse oximeter). See below for recommendations.     Follow Up Recommendations Home health PT;Supervision for mobility/OOB    Equipment Recommendations  None recommended by PT    Recommendations for Other Services       Precautions / Restrictions Precautions Precautions: Fall;Other (comment) Precaution Comments: watch O2 Restrictions Weight Bearing Restrictions: No      Mobility  Bed Mobility Overal bed mobility: Modified Independent                  Transfers Overall transfer level: Needs assistance Equipment used: Rolling walker (2 wheeled) Transfers: Sit to/from Stand Sit to Stand: Min guard         General transfer comment: Increased time to rise  Ambulation/Gait Ambulation/Gait assistance: Min guard Gait Distance (Feet): 10 Feet Assistive device: Rolling walker (2 wheeled) Gait Pattern/deviations: Step-through pattern;Decreased  stride length;Trunk flexed Gait velocity: decreased   General Gait Details: Slow, but steady pace. Cues for pursed lip breathing  Stairs            Wheelchair Mobility    Modified Rankin (Stroke Patients Only)       Balance Overall balance assessment: Needs assistance Sitting-balance support: Feet supported Sitting balance-[name]: Good     Standing balance support: Bilateral upper extremity supported Standing balance-[name]: Poor Standing balance comment: reliant on external support                             Pertinent Vitals/Pain Pain Assessment: No/denies pain    Home Living Family/patient expects to be discharged to:: Private residence Living Arrangements: Spouse/significant other Available Help at Discharge: Family Type of Home: House Home Access: Stairs to enter Entrance Stairs-Rails: Psychiatric nurse of Steps: 5 Home Layout: One level Home Equipment: Environmental consultant - 2 wheels;Cane - single point;Grab bars - tub/shower      Prior Function Level of Independence: Needs assistance   Gait / Transfers Assistance Needed: Reports he uses cane vs RW depending on the day.  ADL's / Homemaking Assistance Needed: independent ADL's, supervision for showering  Comments: Wears 3L O2     Hand Dominance        Extremity/Trunk Assessment   Upper Extremity Assessment Upper Extremity Assessment: Overall WFL for tasks assessed    Lower Extremity Assessment Lower Extremity Assessment: Generalized weakness    Cervical / Trunk Assessment Cervical / Trunk Assessment: Kyphotic  Communication   Communication: HOH  Cognition Arousal/Alertness: Awake/alert Behavior During Therapy: WFL for tasks assessed/performed  Overall Cognitive Status: Within Functional Limits for tasks assessed                                        General Comments      Exercises     Assessment/Plan    PT Assessment Patient needs  continued PT services  PT Problem List Decreased strength;Decreased activity tolerance;Decreased balance;Decreased mobility       PT Treatment Interventions DME instruction;Gait training;Functional mobility training;Therapeutic activities;Therapeutic exercise;Balance training;Patient/family education    PT Goals (Current goals can be found in the Care Plan section)  Acute Rehab PT Goals Patient Stated Goal: improve mobility, return home PT Goal Formulation: With patient/family Time For Goal Achievement: 02/11/21 Potential to Achieve Goals: Good    Frequency Min 3X/week   Barriers to discharge        Co-evaluation               AM-PAC PT "6 Clicks" Mobility  Outcome Measure Help needed turning from your back to your side while in a flat bed without using bedrails?: None Help needed moving from lying on your back to sitting on the side of a flat bed without using bedrails?: A Little Help needed moving to and from a bed to a chair (including a wheelchair)?: A Little Help needed standing up from a chair using your arms (e.g., wheelchair or bedside chair)?: A Little Help needed to walk in hospital room?: A Little Help needed climbing 3-5 steps with a railing? : A Lot 6 Click Score: 18    End of Session Equipment Utilized During Treatment: Gait belt;Oxygen Activity Tolerance: Patient tolerated treatment well Patient left: in bed;with call bell/phone within reach;with family/visitor present   PT Visit Diagnosis: Muscle weakness (generalized) (M62.81);Difficulty in walking, not elsewhere classified (R26.2)    Time: 0349-1791 PT Time Calculation (min) (ACUTE ONLY): 24 min   Charges:   PT Evaluation $PT Eval Moderate Complexity: 1 Mod PT Treatments $Therapeutic Activity: 8-22 mins        Wyona Almas, PT, DPT Acute Rehabilitation Services Pager [phone] Office [phone]   Deno Etienne 01/28/2021, 1:29 PM

## 2021-01-29 ENCOUNTER — Encounter (HOSPITAL_COMMUNITY): Payer: Self-pay | Admitting: Internal Medicine

## 2021-01-29 ENCOUNTER — Inpatient Hospital Stay (HOSPITAL_COMMUNITY): Payer: Medicare Other

## 2021-01-29 DIAGNOSIS — N183 Chronic kidney disease, stage 3 unspecified: Secondary | ICD-10-CM

## 2021-01-29 LAB — CULTURE, BLOOD (ROUTINE X 2)
Culture: NO GROWTH
Culture: NO GROWTH
Special Requests: ADEQUATE

## 2021-01-29 LAB — BRAIN NATRIURETIC PEPTIDE: B Natriuretic Peptide: 170.9 pg/mL — ABNORMAL HIGH (ref 0.0–100.0)

## 2021-01-29 LAB — GLUCOSE, CAPILLARY
Glucose-Capillary: 107 mg/dL — ABNORMAL HIGH (ref 70–99)
Glucose-Capillary: 178 mg/dL — ABNORMAL HIGH (ref 70–99)

## 2021-01-29 MED ORDER — BUDESONIDE 0.25 MG/2ML IN SUSP
0.2500 mg | Freq: Two times a day (BID) | RESPIRATORY_TRACT | Status: DC
  Administered 2021-01-29 – 2021-01-31 (×4): 0.25 mg via RESPIRATORY_TRACT
  Filled 2021-01-29 (×5): qty 2

## 2021-01-29 MED ORDER — METHYLPREDNISOLONE SODIUM SUCC 125 MG IJ SOLR
60.0000 mg | Freq: Two times a day (BID) | INTRAMUSCULAR | Status: DC
  Administered 2021-01-29 – 2021-01-31 (×5): 60 mg via INTRAVENOUS
  Filled 2021-01-29 (×5): qty 2

## 2021-01-29 MED ORDER — IPRATROPIUM-ALBUTEROL 0.5-2.5 (3) MG/3ML IN SOLN
3.0000 mL | Freq: Four times a day (QID) | RESPIRATORY_TRACT | Status: DC
  Administered 2021-01-29 – 2021-01-30 (×5): 3 mL via RESPIRATORY_TRACT
  Filled 2021-01-29 (×6): qty 3

## 2021-01-29 NOTE — Plan of Care (Signed)
  Problem: Nutrition: Goal: Adequate nutrition will be maintained Outcome: Progressing   

## 2021-01-29 NOTE — Evaluation (Signed)
Occupational Therapy Evaluation Patient Details Name: Sean Riley MRN: [id number] DOB: 13-Jun-1951 Today's Date: 01/29/2021    History of Present Illness Pt is a 70 y.o. M who presents with sympomatic anemia with GIB. 6/20 s/p colonoscopy with polyp removal and diverticulosis noted in the sigmoid colon and descending colon. Significant PMH: A. 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   Clinical Impression   Patient is currently requiring assistance with ADLs including moderate assist with toileting,  with LE dressing, and with LB bathing, and full setup assist for seated grooming and UE dressing with need of frequent rest breaks and increase in supplemental O2 to 5L to maintain >90%, due to desaturation to 86% on 3L, all of which is below patient's typical baseline at home where spouse assisted with dressing over feet and bathing only.  During this evaluation, patient was limited by very limited activity tolerance with need of frequent and long rest breaks with cues for PLB and O2 increase to keep sat at or above 90-92%,, which has the potential to impact patient's safety and independence during functional mobility, as well as performance for ADLs. [place] "6-clicks" Daily Activity Inpatient Short Form score of 16/24 this session. Patient lives with spouse, who is able to provide 24/7 supervision and assistance.  Patient demonstrates good rehab potential, and should benefit from continued skilled occupational therapy services while in acute care to maximize safety, independence and quality of life at home.  Continued occupational therapy services in the home is recommended.  ?    Follow Up Recommendations  Home health OT    Equipment Recommendations  Tub/shower bench;Wheelchair (measurements OT) (Tub transfer bench, Systems analyst)    Recommendations for Other Services       Precautions / Restrictions Precautions Precautions:  Fall;Other (comment) Precaution Comments: watch O2 Restrictions Weight Bearing Restrictions: No      Mobility Bed Mobility Overal bed mobility: Modified Independent             General bed mobility comments: Supine<>Sit    Transfers Overall transfer level: Needs assistance Equipment used: Rolling walker (2 wheeled) Transfers: Sit to/from Stand Sit to Stand: Min guard;From elevated surface         General transfer comment: Increased time to rise, cues for hand placement. Bed partially elevated    Balance Overall balance assessment: Needs assistance Sitting-balance support: Feet supported Sitting balance-[name]: Good     Standing balance support: Bilateral upper extremity supported   Standing balance comment: reliant on external support. Can release hands from walker with static standing: Fair                           ADL either performed or assessed with clinical judgement   ADL Overall ADL's : Needs assistance/impaired Eating/Feeding: Independent   Grooming: Sitting;Set up Grooming Details (indicate cue type and reason): Can stand at sink to wash hands but desats on 5L. Recommended sitting for ADLs when possible for energy conservation. Upper Body Bathing: Set up;Sitting Upper Body Bathing Details (indicate cue type and reason): With rest breaks Lower Body Bathing: Moderate assistance;Sitting/lateral leans Lower Body Bathing Details (indicate cue type and reason): With rest breaks Upper Body Dressing : Minimal assistance;Sitting   Lower Body Dressing: Moderate assistance;Sit to/from stand Lower Body Dressing Details (indicate cue type and reason): At baseline, pt is assisted by spouse with socks/shoes and donning underwear/pants over feet. Pt able to  release RW to pull pants over hips but fatigues quickly and desaturates. Toilet Transfer: Programmer, multimedia Details (indicate cue type and reason): Short  ambulation of ~5'. Pt shown how to position his BSC over his toilet at home to increase elevation and use bars for ease Toileting- Clothing Manipulation and Hygiene: Moderate assistance;Sit to/from stand;Sitting/lateral lean     Tub/Shower Transfer Details (indicate cue type and reason): Pt cannot fit hiw RW into his bathroom and his stand-up shower is too small for a chair. Pt does have a tub/shower option and states that his son can remove the sliding door and install a curtain as pt is interested in a tub tarnsfer bench. Pt educated on all bathroom DME options and also recommedned long shower brush or sponge to conserve energy. Functional mobility during ADLs: Supervision/safety;Min guard       Vision   Vision Assessment?: No apparent visual deficits     Perception     Praxis      Pertinent Vitals/Pain Pain Assessment: No/denies pain     Hand Dominance Right   Extremity/Trunk Assessment Upper Extremity Assessment Upper Extremity Assessment: Overall WFL for tasks assessed   Lower Extremity Assessment Lower Extremity Assessment: Generalized weakness   Cervical / Trunk Assessment Cervical / Trunk Assessment: Kyphotic   Communication Communication Communication: HOH   Cognition Arousal/Alertness: Awake/alert Behavior During Therapy: WFL for tasks assessed/performed Overall Cognitive Status: Within Functional Limits for tasks assessed                                     General Comments       Exercises     Shoulder Instructions      Home Living                     Bathroom Shower/Tub:  (Pt uses walk-in shower.)     Bathroom Accessibility: No   Home Equipment: Walker - 2 wheels;Cane - single point;Grab bars - tub/shower;Bedside commode   Additional Comments: Pt sleep in his lift recliner.  Also has an adjustable bed      Prior Functioning/Environment Level of Independence: Needs assistance  Gait / Transfers Assistance Needed: Reports  he uses cane vs RW depending on the day. ADL's / Homemaking Assistance Needed: independent ADL's, supervision for showering.  Spouse performs most IADLs.   Comments: Wears 3L O2        OT Problem List: Decreased coordination;Cardiopulmonary status limiting activity;Decreased activity tolerance;Decreased knowledge of use of DME or AE;Impaired balance (sitting and/or standing)      OT Treatment/Interventions: Self-care/ADL training;Balance training;Therapeutic exercise;Therapeutic activities;Energy conservation;DME and/or AE instruction;Patient/family education    OT Goals(Current goals can be found in the care plan section) Acute Rehab OT Goals Patient Stated Goal: To go home. OT Goal Formulation: With patient Time For Goal Achievement: 02/12/21 Potential to Achieve Goals: Good ADL Goals Pt Will Perform Lower Body Bathing: sitting/lateral leans;with adaptive equipment;with caregiver independent in assisting;with min guard assist Pt Will Perform Lower Body Dressing: with adaptive equipment;sitting/lateral leans;with min assist Pt Will Perform Toileting - Clothing Manipulation and hygiene: with adaptive equipment;with caregiver independent in assisting;with min guard assist;sitting/lateral leans;sit to/from stand Pt Will Perform Tub/Shower Transfer: Tub transfer;tub bench;rolling walker;ambulating;with caregiver independent in assisting Additional ADL Goal #1: Patient will identify at least 3 energy conservation strategies to employ at home in order to maximize function and quality of life and decrease caregiver burden  while preventing exacerbation of symptoms and rehospitalization.  OT Frequency: Min 2X/week   Barriers to D/C:            Co-evaluation              AM-PAC OT "6 Clicks" Daily Activity     Outcome Measure Help from another person eating meals?: None Help from another person taking care of personal grooming?: A Little Help from another person toileting, which  includes using toliet, bedpan, or urinal?: A Lot Help from another person bathing (including washing, rinsing, drying)?: A Lot Help from another person to put on and taking off regular upper body clothing?: A Little Help from another person to put on and taking off regular lower body clothing?: A Lot 6 Click Score: 16   End of Session Equipment Utilized During Treatment: Gait belt;Rolling walker Nurse Communication:  (RN cinformed of pt now oon 4L due to desaturating at rest on 3L)  Activity Tolerance: Patient limited by fatigue Patient left: in bed;with call bell/phone within reach;with bed alarm set  OT Visit Diagnosis: Unsteadiness on feet (R26.81) (decreased ADLs Z73.6)                Time: 1610-9604 OT Time Calculation (min): 52 min Charges:  OT General Charges $OT Visit: 1 Visit OT Evaluation $OT Eval Moderate Complexity: 1 Mod OT Treatments $Self Care/Home Management : 8-22 mins $Therapeutic Activity: 8-22 mins  Anderson Malta, OT Acute Rehab Services Office: [phone] 01/29/2021  Julien Girt 01/29/2021, 9:31 AM

## 2021-01-29 NOTE — Progress Notes (Signed)
PROGRESS NOTE    Sean Riley  VWU:[id number] DOB: 1951-02-24 DOA: 01/24/2021 PCP: Colon Branch, MD     Brief Narrative:  70 y.o. WM PMHx  persistent A. 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 Also endorses generalized weakness. Reports this has been present for 5 days, gradually worsening. Is on 3L O2 by [place] at home for COPD, had to increase this amount and then wife turned back down when O2 saturations stabilized. Per EMS, he was hypoxic in mid 80s on 3L. Chest pain is left sided in nature. Has no diaphoresis, arm pain, jaw claudication.   Notes that he had a bowel movement earlier this week that was black and tarry. He has not had BM since. No hematochezia. No hematemesis. No hemoptysis. No abdominal pain or heartburn symptoms. Has been taking Eliquis.      ED work-up/course:  Patient is a 70 year old male with complicated past medical history including atrial fibrillation anticoagulated on Eliquis, coronary artery disease, COPD, CHF, previous aortic dissection status postrepair in 2018 who presented to the emergency department via EMS with worsening left-sided chest pain (present over the last 5 days worsening about 2 hours prior to arrival), shortness of breath and found to be hypoxic into the mid 80s on his baseline 3 L nasal cannula. Emergency department he was ill-appearing but nontoxic. Borderline hypotensive.  Received 500 of crystalloid by EMS with improvement.  Afebrile on oral check however will check rectal temp. He is on 5 L nasal cannula with oxygen saturations fluctuating in the low to mid 90s. Active cough during my exam.  I am concerned for pneumonia. Chest x-ray ordered. Also highly considering ACS. EKG as above -atrial fibrillation with nonspecific ST-T wave changes. Ordered for cardiac biomarkers as well as BNP.  Please  see ED clinical course above for further medical decision making.   ED course: Patient is critically ill for the following reasons: Acute blood loss anemia causing hemorrhagic shock, acute hypoxic respiratory failure likely in the setting of poor perfusion and global ischemia, unstable angina. Impression is likely symptomatic anemia, likely GI loss in the setting of systemic anticoagulation.  No hematemesis. I ordered for a dose of IV Protonix and Rocephin. Patient getting blood transfused. Remained hemodynamically stable after crystalloid transfusion.  Admitted to medicine for further treatment.   Subjective: Feels more short of breath today. Feels that he is wheezing and coughing. Becomes short of breath with minimal exertion, which is not his baseline   Assessment & Plan: Covid vaccination;   Active Problems:   Hyperlipidemia   tobacco use d/o   Atrial fibrillation (HCC)   BPH (benign prostatic hyperplasia)   RBBB   HTN (hypertension)   S/P aortic dissection repair   COPD (chronic obstructive pulmonary disease) (HCC)   Atrial tachycardia (HCC)   Symptomatic anemia   GI bleed   Chest pain   Leukocytosis   CKD (chronic kidney disease)   UTI due to extended-spectrum beta lactamase (ESBL) producing Escherichia coli   Chronic diastolic CHF (congestive heart failure) (HCC)   Acute GI bleeding   Heme + stool  Symptomatic Anemia with GI Bleed -Patient presenting with chest pain, SOB, dizziness. Found to be hypoxic to mid 80s on baseline 3L [place].  -Received 500 cc crystalloid by EMS. HgB 5.0 at presentation. Initially was mildly hypotensive, now hemodynamically stable.  -Received  additional 500 cc NS bolus in ED.  -Remote history of Fe deficiency anemia, not currently on supplement.  -Is on anticoagulation for Afib.  -Notes melena at home. FOBT positive in ED.  -Colonoscopy in 2017 with precancerous polyps; was recommended to repeat in 3 years. Has not had repeat colonoscopy  since.  -6/17 transfuse 1 unit PRBC - 6/18 transfuse 3 units PRBC  -6/20 s/p colonoscopy/EGD: See findings below -6/20 capsule study ongoing: Swallow pill cam at 1213 -6/21 per GI note negative sites of bleeding identified on colonoscopy, EGD, or capsule study. Lab Results  Component Value Date   HGB 9.0 (L) 01/28/2021   HGB 8.9 (L) 01/27/2021   HGB 8.5 (L) 01/26/2021   HGB 7.5 (L) 01/25/2021   HGB 5.6 (LL) 01/25/2021  -Stable -6/21 EGD, colonoscopy, capsule study unrevealing for GI bleed see results below.   -will check CT abdomen to evaluate for any retroperitoneal hematoma.    Chest Pain -Suspect related to anemia. Troponin normal. EKG does not show any signs of STEMI.  -Resolved   Chronic Diastolic CHF Appears to be hypovolemic at presentation. Last echo EF 60-65% with right ventricular function mildly reduced. -monitor volume status as receives fluids and blood products -strict I/Os  -daily weights Filed Weights   01/24/21 1805 01/28/21 0955 01/29/21 0228  Weight: 89.3 kg 91.1 kg 90.7 kg  -hold Torsemide    Afib -Seen on EKG at admit. HR is mildly elevated, likely related to anemia. --6/21 restart Eliquis 5 mg  BID -Metoprolol 12.5 mg BID - 6/21 Flecainide 75 mg BID -6/21 spoke at length with wife and patient concerning warning signs to watch out for since we are going to restart Eliquis.  If patient bleeds again we will have to decide in conjunction with cardiologist on/ if to stop permanently Eliquis.   COPD -Acute on chronic respiratory failure with hypoxia  - Titrate O2 to maintain SPO2 > 92% -he is chronically on 3-5L of oxygen at home - Not on long-term controller inhaler for COPD - currently, he feels that he has increased work of breathing and that his respiratory status is not at baseline. Becomes short of breath with minimal exertion -continue bronchodilators, start inhaled steroids and solumedrol -recheck chest xray and BNP -Patient requires follow-up as  an outpatient spirometry, DLCO.   Leukocytosis -WBC 11.4. Had leukocytosis in Jan 2022 to 12 at admission with no clear etiology.  Lactic acid 1.6. Covid and flu neg. -UA see UTI ESBL positive UTI - 6/20 discussed case with pharmacy recommended Meropenem 1 g q 8 hr -upon discharge can consider transitioning to macrodantin  CKD III Baseline Cr 1.8 Lab Results  Component Value Date   CREATININE 1.23 01/28/2021   CREATININE 1.34 (H) 01/27/2021   CREATININE 1.44 (H) 01/26/2021   CREATININE 1.77 (H) 01/25/2021   CREATININE 1.82 (H) 01/24/2021  -Better than baseline  Impaired Glucose Tolerance -6/18 hemoglobin A1c = 4.4 which does not even meet the guidelines for prediabetes  -On discharge would not restart Jardiance.  Patient can discuss with his when/if to restart      Chronic anxiety/depression -Resume home regimen   Hyperlipidemia -Resume home regimen.  Deconditioning - 6/21 PT/OT consult:Eval for for H/H, PT, OT.   -6/21 PT recommends home health, PT.   Goals of care - We will attempt to discharge patient on 6/23 if respiratory status has improved    DVT prophylaxis: eliquis Code Status: Full Family Communication: 6/22 wife at bedside for discussion of plan  of care all questions answered Status is: Inpatient    Dispo: The patient is from: Home              Anticipated d/c is to: Home              Anticipated d/c date is: 6/23              Patient currently unstable      Consultants:  GI Dr. Silvano Rusk   Procedures/Significant Events:  6/20 s/p colonoscopy:Three diminutive polyps in the descending colon and in the transverse colon, removed with a cold snare. Resected and retrieved. - Diverticulosis in the sigmoid colon and in the descending colon. - Non-bleeding internal hemorrhoids. - The examination was otherwise normal on direct and retroflexion views. 6/20 s/p EGD - Normal esophagus. - Normal stomach. - Normal examined duodenum. - No specimens  collected. 6/20 PillCam swallowed at 1213:Findings some scattered phlebectasia but normal study no cause of heme positive stool question of melena and anemia  I have personally reviewed and interpreted all radiology studies and my findings are as above.  Antimicrobials: Anti-infectives (From admission, onward)    Start     Ordered Stop   01/27/21 1615  meropenem (MERREM) 1 g in sodium chloride 0.9 % 100 mL IVPB        01/27/21 1547     01/24/21 2100  cefTRIAXone (ROCEPHIN) 2 g in sodium chloride 0.9 % 100 mL IVPB        01/24/21 2057 01/24/21 2232         Devices    LINES / TUBES:      Continuous Infusions:  sodium chloride Stopped (01/29/21 0008)   meropenem (MERREM) IV 1 g (01/29/21 0830)     Objective: Vitals:   01/29/21 0228 01/29/21 0454 01/29/21 0837 01/29/21 1120  BP:  134/68  117/61  Pulse:  65 100 63  Resp:  17  17  Temp:  97.7 F (36.5 C)    TempSrc:  Oral    SpO2:  94% 93% 98%  Weight: 90.7 kg     Height:        Intake/Output Summary (Last 24 hours) at 01/29/2021 1403 Last data filed at 01/29/2021 0700 Gross per 24 hour  Intake 1101.5 ml  Output 1475 ml  Net -373.5 ml   Filed Weights   01/24/21 1805 01/28/21 0955 01/29/21 0228  Weight: 89.3 kg 91.1 kg 90.7 kg    Examination:  General exam: Alert, awake, oriented x 3 Respiratory system: increased respiratory effort, diminished breath sounds bilaterally with wheezing Cardiovascular system:RRR. No murmurs, rubs, gallops. Gastrointestinal system: Abdomen is nondistended, soft and nontender. No organomegaly or masses felt. Normal bowel sounds heard. Central nervous system: Alert and oriented. No focal neurological deficits. Extremities: No C/C/E, +pedal pulses Skin: No rashes, lesions or ulcers Psychiatry: Judgement and insight appear normal. Mood & affect appropriate.    .     Data Reviewed: Care during the described time interval was provided by me .  I have reviewed this patient's  available data, including medical history, events of note, physical examination, and all test results as part of my evaluation.  CBC: Recent Labs  Lab 01/24/21 1829 01/24/21 1845 01/25/21 0245 01/25/21 1324 01/26/21 0247 01/27/21 0748 01/28/21 0818  WBC 11.4*  --  8.2  --  8.1 6.3 6.9  NEUTROABS 9.3*  --   --   --  6.4  --  5.4  HGB 5.0*   < >  5.6* 7.5* 8.5* 8.9* 9.0*  HCT 16.2*   < > 17.9* 22.7* 25.4* 27.6* 28.7*  MCV 103.8*  --  101.7*  --  91.7 94.8 95.3  PLT 232  --  214  --  205 225 227   < > = values in this interval not displayed.   Basic Metabolic Panel: Recent Labs  Lab 01/24/21 1829 01/24/21 1845 01/25/21 0245 01/26/21 0247 01/27/21 0748 01/28/21 0818  NA 135 136 136 135 139 133*  K 3.6 3.6 3.7 3.5 3.5 3.4*  CL 99  --  100 100 104 101  CO2 26  --  26 27 27 24   GLUCOSE 131*  --  118* 96 96 155*  BUN 47*  --  43* 25* 15 11  CREATININE 1.82*  --  1.77* 1.44* 1.34* 1.23  CALCIUM 8.5*  --  8.1* 8.2* 8.5* 8.3*  MG  --   --   --   --   --  2.1  PHOS  --   --   --   --   --  3.4   GFR: Estimated Creatinine Clearance: 60.5 mL/min (by C-G formula based on SCr of 1.23 mg/dL). Liver Function Tests: Recent Labs  Lab 01/24/21 1829 01/28/21 0818  AST 17 19  ALT 15 14  ALKPHOS 64 65  BILITOT 0.7 1.1  PROT 5.9* 5.8*  ALBUMIN 3.1* 2.9*   No results for input(s): LIPASE, AMYLASE in the last 168 hours. No results for input(s): AMMONIA in the last 168 hours. Coagulation Profile: No results for input(s): INR, PROTIME in the last 168 hours. Cardiac Enzymes: No results for input(s): CKTOTAL, CKMB, CKMBINDEX, TROPONINI in the last 168 hours. BNP (last 3 results) No results for input(s): PROBNP in the last 8760 hours. HbA1C: No results for input(s): HGBA1C in the last 72 hours.  CBG: Recent Labs  Lab 01/27/21 0712 01/27/21 1151 01/27/21 1739 01/28/21 0125 01/28/21 0741  GLUCAP 111* 76 179* 120* 91   Lipid Profile: No results for input(s): CHOL, HDL,  LDLCALC, TRIG, CHOLHDL, LDLDIRECT in the last 72 hours. Thyroid Function Tests: No results for input(s): TSH, T4TOTAL, FREET4, T3FREE, THYROIDAB in the last 72 hours. Anemia Panel: Recent Labs    01/28/21 0328  VITAMINB12 275   Sepsis Labs: Recent Labs  Lab 01/24/21 1829 01/25/21 0245  LATICACIDVEN 1.6 0.7    Recent Results (from the past 240 hour(s))  Urine culture     Status: Abnormal   Collection Time: 01/24/21  6:29 PM   Specimen: Urine, Random  Result Value Ref Range Status   Specimen Description URINE, RANDOM  Final   Special Requests   Final    NONE Performed at [facility], [place] [address] [place], [place] [address]    Culture (A)  Final    70,000 COLONIES/mL ESCHERICHIA COLI Confirmed Extended Spectrum Beta-Lactamase Producer (ESBL).  In bloodstream infections from ESBL organisms, carbapenems are preferred over piperacillin/tazobactam. They are shown to have a lower risk of mortality.    Report Status 01/27/2021 FINAL  Final   Organism ID, Bacteria ESCHERICHIA COLI (A)  Final      Susceptibility   Escherichia coli - MIC*    AMPICILLIN >=32 RESISTANT Resistant     CEFAZOLIN >=64 RESISTANT Resistant     CEFEPIME 16 RESISTANT Resistant     CEFTRIAXONE >=64 RESISTANT Resistant     CIPROFLOXACIN >=4 RESISTANT Resistant     GENTAMICIN 2 SENSITIVE Sensitive     IMIPENEM <=0.25 SENSITIVE  Sensitive     NITROFURANTOIN <=16 SENSITIVE Sensitive     TRIMETH/SULFA >=320 RESISTANT Resistant     AMPICILLIN/SULBACTAM >=32 RESISTANT Resistant     PIP/TAZO 8 SENSITIVE Sensitive     * 70,000 COLONIES/mL ESCHERICHIA COLI  Blood culture (routine x 2)     Status: None   Collection Time: 01/24/21  6:29 PM   Specimen: BLOOD  Result Value Ref Range Status   Specimen Description BLOOD LEFT ANTECUBITAL  Final   Special Requests   Final    BOTTLES DRAWN AEROBIC AND ANAEROBIC Blood Culture results may not be optimal due to an excessive volume of blood received in culture  bottles   Culture   Final    NO GROWTH 5 DAYS Performed at [facility], [place] [address] [place], [place] [address]    Report Status 01/29/2021 FINAL  Final  Resp Panel by RT-PCR (Flu A&B, Covid) Nasopharyngeal Swab     Status: None   Collection Time: 01/24/21  6:29 PM   Specimen: Nasopharyngeal Swab; Nasopharyngeal(NP) swabs in vial transport medium  Result Value Ref Range Status   SARS Coronavirus 2 by RT PCR NEGATIVE NEGATIVE Final    Comment: (NOTE) SARS-CoV-2 target nucleic acids are NOT DETECTED.  The SARS-CoV-2 RNA is generally detectable in upper respiratory specimens during the acute phase of infection. The lowest concentration of SARS-CoV-2 viral copies this assay can detect is 138 copies/mL. A negative result does not preclude SARS-Cov-2 infection and should not be used as the sole basis for treatment or other patient management decisions. A negative result may occur with  improper specimen collection/handling, submission of specimen other than nasopharyngeal swab, presence of viral mutation(s) within the areas targeted by this assay, and inadequate number of viral copies(<138 copies/mL). A negative result must be combined with clinical observations, patient history, and epidemiological information. The expected result is Negative.  Fact Sheet for Patients:  EntrepreneurPulse.com.au  Fact Sheet for Healthcare Providers:  IncredibleEmployment.be  This test is no t yet approved or cleared by the [place] FDA and  has been authorized for detection and/or diagnosis of SARS-CoV-2 by FDA under an Emergency Use Authorization (EUA). This EUA will remain  in effect (meaning this test can be used) for the duration of the COVID-19 declaration under Section 564(b)(1) of the Act, 21 U.S.C.section 360bbb-3(b)(1), unless the authorization is terminated  or revoked sooner.       Influenza A by PCR NEGATIVE NEGATIVE Final   Influenza  B by PCR NEGATIVE NEGATIVE Final    Comment: (NOTE) The Xpert Xpress SARS-CoV-2/FLU/RSV plus assay is intended as an aid in the diagnosis of influenza from Nasopharyngeal swab specimens and should not be used as a sole basis for treatment. Nasal washings and aspirates are unacceptable for Xpert Xpress SARS-CoV-2/FLU/RSV testing.  Fact Sheet for Patients: EntrepreneurPulse.com.au  Fact Sheet for Healthcare Providers: IncredibleEmployment.be  This test is not yet approved or cleared by the [place] FDA and has been authorized for detection and/or diagnosis of SARS-CoV-2 by FDA under an Emergency Use Authorization (EUA). This EUA will remain in effect (meaning this test can be used) for the duration of the COVID-19 declaration under Section 564(b)(1) of the Act, 21 U.S.C. section 360bbb-3(b)(1), unless the authorization is terminated or revoked.  Performed at [facility], [place] [address] [place], [place] [address]   Blood culture (routine x 2)     Status: None   Collection Time: 01/24/21  9:10 PM   Specimen: BLOOD  Result Value Ref Range Status   Specimen Description BLOOD RIGHT ARM  Final   Special Requests   Final    BOTTLES DRAWN AEROBIC AND ANAEROBIC Blood Culture adequate volume   Culture   Final    NO GROWTH 5 DAYS Performed at [facility], [address] [place], [place] [address]    Report Status 01/29/2021 FINAL  Final  Culture, blood (single)     Status: None (Preliminary result)   Collection Time: 01/24/21  9:30 PM   Specimen: BLOOD  Result Value Ref Range Status   Specimen Description BLOOD SITE NOT SPECIFIED  Final   Special Requests   Final    BOTTLES DRAWN AEROBIC AND ANAEROBIC Blood Culture results may not be optimal due to an inadequate volume of blood received in culture bottles   Culture   Final    NO GROWTH 4 DAYS Performed at [facility], [place] [address], [place], [place] [address]    Report  Status PENDING  Incomplete          Radiology Studies: No results found.      Scheduled Meds:  ALPRAZolam  1 mg Oral QHS   apixaban  5 mg Oral BID   budesonide (PULMICORT) nebulizer solution  0.25 mg Nebulization BID   escitalopram  10 mg Oral Daily   flecainide  75 mg Oral BID   insulin aspart  0-9 Units Subcutaneous TID WC   ipratropium-albuterol  3 mL Nebulization Q6H   methylPREDNISolone (SOLU-MEDROL) injection  60 mg Intravenous Q12H   metoprolol tartrate  12.5 mg Oral BID   pantoprazole (PROTONIX) IV  40 mg Intravenous Q12H   rosuvastatin  5 mg Oral Daily   Continuous Infusions:  sodium chloride Stopped (01/29/21 0008)   meropenem (MERREM) IV 1 g (01/29/21 0830)     LOS: 5 days    Time spent:40 min    Kathie Dike, MD [facility]   If 7PM-7AM, please contact night-coverage 01/29/2021, 2:03 PM

## 2021-01-30 LAB — GLUCOSE, CAPILLARY
Glucose-Capillary: 134 mg/dL — ABNORMAL HIGH (ref 70–99)
Glucose-Capillary: 144 mg/dL — ABNORMAL HIGH (ref 70–99)
Glucose-Capillary: 137 mg/dL — ABNORMAL HIGH (ref 70–99)

## 2021-01-30 LAB — CULTURE, BLOOD (SINGLE): Culture: NO GROWTH

## 2021-01-30 MED ORDER — FUROSEMIDE 10 MG/ML IJ SOLN
60.0000 mg | Freq: Two times a day (BID) | INTRAMUSCULAR | Status: DC
  Administered 2021-01-30 – 2021-01-31 (×3): 60 mg via INTRAVENOUS
  Filled 2021-01-30 (×3): qty 6

## 2021-01-30 MED ORDER — POTASSIUM CHLORIDE CRYS ER 20 MEQ PO TBCR
40.0000 meq | EXTENDED_RELEASE_TABLET | Freq: Once | ORAL | Status: AC
  Administered 2021-01-30: 40 meq via ORAL
  Filled 2021-01-30: qty 2

## 2021-01-30 MED ORDER — IPRATROPIUM-ALBUTEROL 0.5-2.5 (3) MG/3ML IN SOLN
3.0000 mL | Freq: Three times a day (TID) | RESPIRATORY_TRACT | Status: DC
  Administered 2021-01-31: 3 mL via RESPIRATORY_TRACT
  Filled 2021-01-30: qty 3

## 2021-01-30 NOTE — Progress Notes (Signed)
PROGRESS NOTE    KIMARI COUDRIET  YQI:[id number] DOB: 1950/09/19 DOA: 01/24/2021 PCP: Colon Branch, MD     Brief Narrative:  70 y.o. WM PMHx  persistent A. 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 Also endorses generalized weakness. Reports this has been present for 5 days, gradually worsening. Is on 3L O2 by [place] at home for COPD, had to increase this amount and then wife turned back down when O2 saturations stabilized. Per EMS, he was hypoxic in mid 80s on 3L. Chest pain is left sided in nature. Has no diaphoresis, arm pain, jaw claudication.   Notes that he had a bowel movement earlier this week that was black and tarry. He has not had BM since. No hematochezia. No hematemesis. No hemoptysis. No abdominal pain or heartburn symptoms. Has been taking Eliquis.      ED work-up/course:  Patient is a 70 year old male with complicated past medical history including atrial fibrillation anticoagulated on Eliquis, coronary artery disease, COPD, CHF, previous aortic dissection status postrepair in 2018 who presented to the emergency department via EMS with worsening left-sided chest pain (present over the last 5 days worsening about 2 hours prior to arrival), shortness of breath and found to be hypoxic into the mid 80s on his baseline 3 L nasal cannula. Emergency department he was ill-appearing but nontoxic. Borderline hypotensive.  Received 500 of crystalloid by EMS with improvement.  Afebrile on oral check however will check rectal temp. He is on 5 L nasal cannula with oxygen saturations fluctuating in the low to mid 90s. Active cough during my exam.  I am concerned for pneumonia. Chest x-ray ordered. Also highly considering ACS. EKG as above -atrial fibrillation with nonspecific ST-T wave changes. Ordered for cardiac biomarkers as well as BNP.  Please  see ED clinical course above for further medical decision making.   ED course: Patient is critically ill for the following reasons: Acute blood loss anemia causing hemorrhagic shock, acute hypoxic respiratory failure likely in the setting of poor perfusion and global ischemia, unstable angina. Impression is likely symptomatic anemia, likely GI loss in the setting of systemic anticoagulation.  No hematemesis. I ordered for a dose of IV Protonix and Rocephin. Patient getting blood transfused. Remained hemodynamically stable after crystalloid transfusion.  Admitted to medicine for further treatment.   Subjective: Continues to cough and feel short of breath. Still has wheezing   Assessment & Plan: Covid vaccination;   Active Problems:   Hyperlipidemia   tobacco use d/o   Atrial fibrillation (HCC)   BPH (benign prostatic hyperplasia)   RBBB   HTN (hypertension)   S/P aortic dissection repair   COPD (chronic obstructive pulmonary disease) (HCC)   Atrial tachycardia (HCC)   Symptomatic anemia   GI bleed   Chest pain   Leukocytosis   CKD (chronic kidney disease)   UTI due to extended-spectrum beta lactamase (ESBL) producing Escherichia coli   Chronic diastolic CHF (congestive heart failure) (HCC)   Acute GI bleeding   Heme + stool  Symptomatic Anemia with GI Bleed -Patient presenting with chest pain, SOB, dizziness. Found to be hypoxic to mid 80s on baseline 3L [place].  -Received 500 cc crystalloid by EMS. HgB 5.0 at presentation. Initially was mildly hypotensive, now hemodynamically stable.  -Received additional 500 cc NS bolus in ED.  -Remote history of Fe deficiency anemia,  not currently on supplement.  -Is on anticoagulation for Afib.  -Notes melena at home. FOBT positive in ED.  -Colonoscopy in 2017 with precancerous polyps; was recommended to repeat in 3 years. Has not had repeat colonoscopy since.  -6/17 transfuse 1 unit PRBC - 6/18 transfuse 3 units PRBC  -6/20 s/p  colonoscopy/EGD: See findings below -6/20 capsule study ongoing: Swallow pill cam at 1213 -6/21 per GI note negative sites of bleeding identified on colonoscopy, EGD, or capsule study. Lab Results  Component Value Date   HGB 9.0 (L) 01/28/2021   HGB 8.9 (L) 01/27/2021   HGB 8.5 (L) 01/26/2021   HGB 7.5 (L) 01/25/2021   HGB 5.6 (LL) 01/25/2021  -Stable -6/21 EGD, colonoscopy, capsule study unrevealing for GI bleed see results below.   -CT abd negative for any retroperitoneal hematoma.    Chest Pain -Suspect related to anemia. Troponin normal. EKG does not show any signs of STEMI.  -Resolved   Acute on Chronic Diastolic CHF Appears to be hypovolemic at presentation. Last echo EF 60-65% with right ventricular function mildly reduced. -monitor volume status as receives fluids and blood products -strict I/Os  -daily weights Filed Weights   01/28/21 0955 01/29/21 0228 01/29/21 2050  Weight: 91.1 kg 90.7 kg 95.1 kg  -currently, chest xray shows bilateral pleural effusions -BNP minimally elevated -will give a trial of IV lasix    Afib -Seen on EKG at admit. HR is mildly elevated, likely related to anemia. --6/21 restart Eliquis 5 mg  BID -Metoprolol 12.5 mg BID - 6/21 Flecainide 75 mg BID -6/21 spoke at length with wife and patient concerning warning signs to watch out for since we are going to restart Eliquis.  If patient bleeds again we will have to decide in conjunction with cardiologist on/ if to stop permanently Eliquis.   COPD -Acute on chronic respiratory failure with hypoxia  - Titrate O2 to maintain SPO2 > 92% -he is chronically on 3-5L of oxygen at home - Not on long-term controller inhaler for COPD - currently, he feels that he has increased work of breathing and that his respiratory status is not at baseline. Becomes short of breath with minimal exertion -continue bronchodilators, inhaled steroids and solumedrol -Patient requires follow-up as an outpatient spirometry,  DLCO.   Leukocytosis -WBC 11.4. Had leukocytosis in Jan 2022 to 12 at admission with no clear etiology.  Lactic acid 1.6. Covid and flu neg. -UA see UTI ESBL positive UTI - 6/20 discussed case with pharmacy recommended Meropenem 1 g q 8 hr   CKD III Baseline Cr 1.8 Lab Results  Component Value Date   CREATININE 1.23 01/28/2021   CREATININE 1.34 (H) 01/27/2021   CREATININE 1.44 (H) 01/26/2021   CREATININE 1.77 (H) 01/25/2021   CREATININE 1.82 (H) 01/24/2021  -Better than baseline  Impaired Glucose Tolerance -6/18 hemoglobin A1c = 4.4 which does not even meet the guidelines for prediabetes  -On discharge would not restart Jardiance.  Patient can discuss with his when/if to restart      Chronic anxiety/depression -Resume home regimen   Hyperlipidemia -Resume home regimen.  Deconditioning - 6/21 PT/OT consult:Eval for for H/H, PT, OT.   -6/21 PT recommends home health, PT.    DVT prophylaxis: eliquis Code Status: Full Family Communication: 6/23 wife at bedside for discussion of plan of care all questions answered Status is: Inpatient    Dispo: The patient is from: Home              Anticipated  d/c is to: Home              Anticipated d/c date is: 6/23              Patient currently unstable      Consultants:  GI Dr. Silvano Rusk   Procedures/Significant Events:  6/20 s/p colonoscopy:Three diminutive polyps in the descending colon and in the transverse colon, removed with a cold snare. Resected and retrieved. - Diverticulosis in the sigmoid colon and in the descending colon. - Non-bleeding internal hemorrhoids. - The examination was otherwise normal on direct and retroflexion views. 6/20 s/p EGD - Normal esophagus. - Normal stomach. - Normal examined duodenum. - No specimens collected. 6/20 PillCam swallowed at 1213:Findings some scattered phlebectasia but normal study no cause of heme positive stool question of melena and anemia  I have personally  reviewed and interpreted all radiology studies and my findings are as above.  Antimicrobials: Anti-infectives (From admission, onward)    Start     Ordered Stop   01/27/21 1615  meropenem (MERREM) 1 g in sodium chloride 0.9 % 100 mL IVPB        01/27/21 1547     01/24/21 2100  cefTRIAXone (ROCEPHIN) 2 g in sodium chloride 0.9 % 100 mL IVPB        01/24/21 2057 01/24/21 2232         Devices    LINES / TUBES:      Continuous Infusions:  sodium chloride Stopped (01/30/21 0412)   meropenem (MERREM) IV 1 g (01/30/21 0942)     Objective: Vitals:   01/29/21 2100 01/30/21 0243 01/30/21 0410 01/30/21 1004  BP:   138/84 131/67  Pulse:   68 94  Resp:   20 18  Temp:   (!) 97.5 F (36.4 C) 98 F (36.7 C)  TempSrc:   Oral   SpO2: 93% (!) 89% 97% 96%  Weight:      Height:        Intake/Output Summary (Last 24 hours) at 01/30/2021 1321 Last data filed at 01/30/2021 0800 Gross per 24 hour  Intake 695.78 ml  Output 1225 ml  Net -529.22 ml   Filed Weights   01/28/21 0955 01/29/21 0228 01/29/21 2050  Weight: 91.1 kg 90.7 kg 95.1 kg    Examination:  General exam: Alert, awake, oriented x 3 Respiratory system: still has increased work of breathing, improving air entry and wheezes appreciated Cardiovascular system:RRR. No murmurs, rubs, gallops. Gastrointestinal system: Abdomen is nondistended, soft and nontender. No organomegaly or masses felt. Normal bowel sounds heard. Central nervous system: Alert and oriented. No focal neurological deficits. Extremities: 1+ edema bilaterally Skin: No rashes, lesions or ulcers Psychiatry: Judgement and insight appear normal. Mood & affect appropriate.    .     Data Reviewed: Care during the described time interval was provided by me .  I have reviewed this patient's available data, including medical history, events of note, physical examination, and all test results as part of my evaluation.  CBC: Recent Labs  Lab 01/24/21 1829  01/24/21 1845 01/25/21 0245 01/25/21 1324 01/26/21 0247 01/27/21 0748 01/28/21 0818  WBC 11.4*  --  8.2  --  8.1 6.3 6.9  NEUTROABS 9.3*  --   --   --  6.4  --  5.4  HGB 5.0*   < > 5.6* 7.5* 8.5* 8.9* 9.0*  HCT 16.2*   < > 17.9* 22.7* 25.4* 27.6* 28.7*  MCV 103.8*  --  101.7*  --  91.7 94.8 95.3  PLT 232  --  214  --  205 225 227   < > = values in this interval not displayed.   Basic Metabolic Panel: Recent Labs  Lab 01/24/21 1829 01/24/21 1845 01/25/21 0245 01/26/21 0247 01/27/21 0748 01/28/21 0818  NA 135 136 136 135 139 133*  K 3.6 3.6 3.7 3.5 3.5 3.4*  CL 99  --  100 100 104 101  CO2 26  --  26 27 27 24   GLUCOSE 131*  --  118* 96 96 155*  BUN 47*  --  43* 25* 15 11  CREATININE 1.82*  --  1.77* 1.44* 1.34* 1.23  CALCIUM 8.5*  --  8.1* 8.2* 8.5* 8.3*  MG  --   --   --   --   --  2.1  PHOS  --   --   --   --   --  3.4   GFR: Estimated Creatinine Clearance: 65.9 mL/min (by C-G formula based on SCr of 1.23 mg/dL). Liver Function Tests: Recent Labs  Lab 01/24/21 1829 01/28/21 0818  AST 17 19  ALT 15 14  ALKPHOS 64 65  BILITOT 0.7 1.1  PROT 5.9* 5.8*  ALBUMIN 3.1* 2.9*   No results for input(s): LIPASE, AMYLASE in the last 168 hours. No results for input(s): AMMONIA in the last 168 hours. Coagulation Profile: No results for input(s): INR, PROTIME in the last 168 hours. Cardiac Enzymes: No results for input(s): CKTOTAL, CKMB, CKMBINDEX, TROPONINI in the last 168 hours. BNP (last 3 results) No results for input(s): PROBNP in the last 8760 hours. HbA1C: No results for input(s): HGBA1C in the last 72 hours.  CBG: Recent Labs  Lab 01/28/21 0741 01/29/21 1638 01/29/21 2054 01/30/21 0645 01/30/21 1152  GLUCAP 91 107* 178* 134* 144*   Lipid Profile: No results for input(s): CHOL, HDL, LDLCALC, TRIG, CHOLHDL, LDLDIRECT in the last 72 hours. Thyroid Function Tests: No results for input(s): TSH, T4TOTAL, FREET4, T3FREE, THYROIDAB in the last 72  hours. Anemia Panel: Recent Labs    01/28/21 0328  VITAMINB12 275   Sepsis Labs: Recent Labs  Lab 01/24/21 1829 01/25/21 0245  LATICACIDVEN 1.6 0.7    Recent Results (from the past 240 hour(s))  Urine culture     Status: Abnormal   Collection Time: 01/24/21  6:29 PM   Specimen: Urine, Random  Result Value Ref Range Status   Specimen Description URINE, RANDOM  Final   Special Requests   Final    NONE Performed at [facility], [place] [address] [place], [place] [address]    Culture (A)  Final    70,000 COLONIES/mL ESCHERICHIA COLI Confirmed Extended Spectrum Beta-Lactamase Producer (ESBL).  In bloodstream infections from ESBL organisms, carbapenems are preferred over piperacillin/tazobactam. They are shown to have a lower risk of mortality.    Report Status 01/27/2021 FINAL  Final   Organism ID, Bacteria ESCHERICHIA COLI (A)  Final      Susceptibility   Escherichia coli - MIC*    AMPICILLIN >=32 RESISTANT Resistant     CEFAZOLIN >=64 RESISTANT Resistant     CEFEPIME 16 RESISTANT Resistant     CEFTRIAXONE >=64 RESISTANT Resistant     CIPROFLOXACIN >=4 RESISTANT Resistant     GENTAMICIN 2 SENSITIVE Sensitive     IMIPENEM <=0.25 SENSITIVE Sensitive     NITROFURANTOIN <=16 SENSITIVE Sensitive     TRIMETH/SULFA >=320 RESISTANT Resistant     AMPICILLIN/SULBACTAM >=32 RESISTANT Resistant  PIP/TAZO 8 SENSITIVE Sensitive     * 70,000 COLONIES/mL ESCHERICHIA COLI  Blood culture (routine x 2)     Status: None   Collection Time: 01/24/21  6:29 PM   Specimen: BLOOD  Result Value Ref Range Status   Specimen Description BLOOD LEFT ANTECUBITAL  Final   Special Requests   Final    BOTTLES DRAWN AEROBIC AND ANAEROBIC Blood Culture results may not be optimal due to an excessive volume of blood received in culture bottles   Culture   Final    NO GROWTH 5 DAYS Performed at [facility], [place] [address] [place], [place] [address]    Report Status 01/29/2021 FINAL  Final   Resp Panel by RT-PCR (Flu A&B, Covid) Nasopharyngeal Swab     Status: None   Collection Time: 01/24/21  6:29 PM   Specimen: Nasopharyngeal Swab; Nasopharyngeal(NP) swabs in vial transport medium  Result Value Ref Range Status   SARS Coronavirus 2 by RT PCR NEGATIVE NEGATIVE Final    Comment: (NOTE) SARS-CoV-2 target nucleic acids are NOT DETECTED.  The SARS-CoV-2 RNA is generally detectable in upper respiratory specimens during the acute phase of infection. The lowest concentration of SARS-CoV-2 viral copies this assay can detect is 138 copies/mL. A negative result does not preclude SARS-Cov-2 infection and should not be used as the sole basis for treatment or other patient management decisions. A negative result may occur with  improper specimen collection/handling, submission of specimen other than nasopharyngeal swab, presence of viral mutation(s) within the areas targeted by this assay, and inadequate number of viral copies(<138 copies/mL). A negative result must be combined with clinical observations, patient history, and epidemiological information. The expected result is Negative.  Fact Sheet for Patients:  EntrepreneurPulse.com.au  Fact Sheet for Healthcare Providers:  IncredibleEmployment.be  This test is no t yet approved or cleared by the [place] FDA and  has been authorized for detection and/or diagnosis of SARS-CoV-2 by FDA under an Emergency Use Authorization (EUA). This EUA will remain  in effect (meaning this test can be used) for the duration of the COVID-19 declaration under Section 564(b)(1) of the Act, 21 U.S.C.section 360bbb-3(b)(1), unless the authorization is terminated  or revoked sooner.       Influenza A by PCR NEGATIVE NEGATIVE Final   Influenza B by PCR NEGATIVE NEGATIVE Final    Comment: (NOTE) The Xpert Xpress SARS-CoV-2/FLU/RSV plus assay is intended as an aid in the diagnosis of influenza from  Nasopharyngeal swab specimens and should not be used as a sole basis for treatment. Nasal washings and aspirates are unacceptable for Xpert Xpress SARS-CoV-2/FLU/RSV testing.  Fact Sheet for Patients: EntrepreneurPulse.com.au  Fact Sheet for Healthcare Providers: IncredibleEmployment.be  This test is not yet approved or cleared by the [place] FDA and has been authorized for detection and/or diagnosis of SARS-CoV-2 by FDA under an Emergency Use Authorization (EUA). This EUA will remain in effect (meaning this test can be used) for the duration of the COVID-19 declaration under Section 564(b)(1) of the Act, 21 U.S.C. section 360bbb-3(b)(1), unless the authorization is terminated or revoked.  Performed at [facility], [place] [address], [place], [place] [address]   Blood culture (routine x 2)     Status: None   Collection Time: 01/24/21  9:10 PM   Specimen: BLOOD  Result Value Ref Range Status   Specimen Description BLOOD RIGHT ARM  Final   Special Requests   Final    BOTTLES DRAWN AEROBIC AND  ANAEROBIC Blood Culture adequate volume   Culture   Final    NO GROWTH 5 DAYS Performed at [facility], [place] [address], [place], [place] [address]    Report Status 01/29/2021 FINAL  Final  Culture, blood (single)     Status: None   Collection Time: 01/24/21  9:30 PM   Specimen: BLOOD  Result Value Ref Range Status   Specimen Description BLOOD SITE NOT SPECIFIED  Final   Special Requests   Final    BOTTLES DRAWN AEROBIC AND ANAEROBIC Blood Culture results may not be optimal due to an inadequate volume of blood received in culture bottles   Culture   Final    NO GROWTH 5 DAYS Performed at [facility], [place] [address] [place], [place] [address]    Report Status 01/30/2021 FINAL  Final          Radiology Studies: CT ABDOMEN PELVIS WO CONTRAST  Result Date: 01/29/2021 CLINICAL DATA:  70 year old male with anemia. Evaluate  for retroperitoneal hematoma. EXAM: CT ABDOMEN AND PELVIS WITHOUT CONTRAST TECHNIQUE: Multidetector CT imaging of the abdomen and pelvis was performed following the standard protocol without IV contrast. COMPARISON:  CT dated 01/15/2021. FINDINGS: Evaluation of this exam is limited in the absence of intravenous contrast. Lower chest: Moderate bilateral pleural effusions similar or slightly increased since the prior CT. There are partial consolidative changes of the visualized lungs which may represent atelectasis or infiltrate. There is coronary vascular calcification. No intra-abdominal free air or free fluid. Hepatobiliary: The liver is unremarkable. No intrahepatic biliary dilatation. Multiple gallstones. No pericholecystic fluid or evidence of acute cholecystitis by CT. Pancreas: Unremarkable. No pancreatic ductal dilatation or surrounding inflammatory changes. Spleen: Normal in size without focal abnormality. Adrenals/Urinary Tract: The adrenal glands unremarkable. The kidneys, visualized ureters appear unremarkable. The urinary bladder is predominantly collapsed. Stomach/Bowel: There is no bowel obstruction or active inflammation. The appendix is not visualized with certainty. No inflammatory changes identified in the right lower quadrant. Vascular/Lymphatic: Similar appearance of chronic abdominal aortic dissection and partially visualized endovascular stent graft repair of the thoracic aorta similar to prior CT. Evaluation of the vasculature is limited in the absence of intravenous contrast. No periaortic fluid collection. No intraperitoneal or retroperitoneal hemorrhage. There is advanced atherosclerotic calcification of the aorta. The IVC is unremarkable. No portal venous gas. There is no adenopathy. Reproductive: The prostate and seminal vesicles are grossly unremarkable no pelvic mass. Other: None Musculoskeletal: Degenerative changes of the spine. No acute osseous pathology. IMPRESSION: 1. No acute  intra-abdominal or pelvic pathology. No intraperitoneal or retroperitoneal hemorrhage. 2. Moderate bilateral pleural effusions similar or slightly increased since the prior CT. There are partial consolidative changes of the visualized lungs which may represent atelectasis or infiltrate. 3. Cholelithiasis. 4. Similar appearance of chronic abdominal aortic dissection and partially visualized endovascular stent graft repair of the thoracic aorta similar to prior CT. 5. Aortic Atherosclerosis (ICD10-I70.0). Electronically Signed   By: Anner Crete M.D.   On: 01/29/2021 19:12   DG CHEST PORT 1 VIEW  Result Date: 01/29/2021 CLINICAL DATA:  Shortness of breath EXAM: PORTABLE CHEST 1 VIEW COMPARISON:  01/25/2021 FINDINGS: Cardiac shadow remains enlarged. Aortic stent graft is again noted and stable. Bilateral pleural effusions are noted similar to that seen on the prior exam. Basilar infiltrates are again noted and stable as well. No new focal abnormality is seen. IMPRESSION: No significant change from the prior exam of 01/25/2021. Persistent effusion and infiltrates are noted. Electronically Signed  By: Inez Catalina M.D.   On: 01/29/2021 15:34        Scheduled Meds:  ALPRAZolam  1 mg Oral QHS   apixaban  5 mg Oral BID   budesonide (PULMICORT) nebulizer solution  0.25 mg Nebulization BID   escitalopram  10 mg Oral Daily   flecainide  75 mg Oral BID   furosemide  60 mg Intravenous BID   insulin aspart  0-9 Units Subcutaneous TID WC   ipratropium-albuterol  3 mL Nebulization Q6H   methylPREDNISolone (SOLU-MEDROL) injection  60 mg Intravenous Q12H   metoprolol tartrate  12.5 mg Oral BID   pantoprazole (PROTONIX) IV  40 mg Intravenous Q12H   potassium chloride  40 mEq Oral Once   rosuvastatin  5 mg Oral Daily   Continuous Infusions:  sodium chloride Stopped (01/30/21 0412)   meropenem (MERREM) IV 1 g (01/30/21 0942)     LOS: 6 days    Time spent: 30 min    Kathie Dike, MD [facility]   If 7PM-7AM, please contact night-coverage 01/30/2021, 1:21 PM

## 2021-01-30 NOTE — TOC Transition Note (Signed)
Transition of Care [facility]) - CM/SW Discharge Note   Patient Details  Name: Sean Riley MRN: [id number] Date of Birth: 02-02-1951  [facility]) CM/SW Contact:  [name], RN Phone Number: 01/30/2021, 2:23 PM   Clinical Narrative:     Patient will need to F/U with primary or endocrinology . He has a Hbg A1C of 4.4, so Jardiance is going to be held for now.  [name] from [facility] is aware of orders for this patient and discharge.   Final next level of care: [name] Barriers to Discharge: No Barriers Identified   Patient Goals and CMS Choice Patient states their goals for this hospitalization and ongoing recovery are:: to return home CMS Medicare.gov Compare Post Acute Care list provided to:: Patient Choice offered to / list presented to : Patient, Spouse  Discharge Placement             Home with Home Health          Discharge Plan and Services   Discharge Planning Services: CM Consult Post Acute Care Choice: Home Health          DME Arranged: N/A         HH Arranged: PT, OT [name] Agency: [name] [facility]) Date HH Agency Contacted: 01/28/21 Time [place]: 1205 Representative spoke with at [place]: [name] [facility]) Interventions     Readmission Risk Interventions No flowsheet data found.

## 2021-01-30 NOTE — Progress Notes (Addendum)
Physical Therapy Treatment Patient Details Name: Sean Riley MRN: [id number] DOB: 12-Nov-1950 Today's Date: 01/30/2021    History of Present Illness Pt is a 70 y.o. M who presents with sympomatic anemia with GIB. 6/20 s/p colonoscopy with polyp removal and diverticulosis noted in the sigmoid colon and descending colon. Significant PMH: A. 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    PT Comments    Pt supine in bed on entry, bed found to be saturated in urine due to PureFlow malfunction. Pt agreeable to getting up and moving around. Pt limited in safe mobility by increased O2 demand (see General Comments) in presence of decreased strength and cardiopulmonary endurance. Pt is mod I for bed mobility, min guard for transfers and contact guard A for ambulation of 40 feet in room. Pt with c/o of lightheadedness, sat for 30 second and said it had passed. Pt with no further c/o of lightheadedness during session. Pt able to titrate down to 4.5 L O2 in seated while maintaining SaO2 94%O2. D/c plans remain appropriate at this time. PT will continue to follow acutely.     Follow Up Recommendations  Home health PT;Supervision for mobility/OOB     Equipment Recommendations  None recommended by PT       Precautions / Restrictions Precautions Precautions: Fall;Other (comment) Precaution Comments: watch O2 Restrictions Weight Bearing Restrictions: No    Mobility  Bed Mobility Overal bed mobility: Modified Independent                  Transfers Overall transfer level: Needs assistance Equipment used: Rolling walker (2 wheeled) Transfers: Sit to/from Stand Sit to Stand: Min guard         General transfer comment: Increased time to rise  Ambulation/Gait Ambulation/Gait assistance: Min guard Gait Distance (Feet): 40 Feet Assistive device: Rolling walker (2 wheeled) Gait Pattern/deviations: Step-through pattern;Decreased stride  length;Trunk flexed Gait velocity: decreased Gait velocity interpretation: <1.31 ft/sec, indicative of household ambulator General Gait Details: Slow, but steady pace. 1x seated rest break for lightheadedness, dissipated quickly and able to return to ambulation with no further lightheadedness       Balance Overall balance assessment: Needs assistance Sitting-balance support: Feet supported Sitting balance-[name]: Good     Standing balance support: Bilateral upper extremity supported Standing balance-[name]: Poor Standing balance comment: reliant on external support                            Cognition Arousal/Alertness: Awake/alert Behavior During Therapy: WFL for tasks assessed/performed Overall Cognitive Status: Within Functional Limits for tasks assessed                                           General Comments General comments (skin integrity, edema, etc.): Pt wife present throughout session. Pt on 5 L O2 via [place] on entry with SaO2 99%O2, ambulated on 6L O2 via [place] with SaO2 91%O2, with return to seated pt able to maintain SaO2 94%O2 on 4.5L O2      Pertinent Vitals/Pain Pain Assessment: No/denies pain     PT Goals (current goals can now be found in the care plan section) Acute Rehab PT Goals Patient Stated Goal: improve mobility, return home PT Goal Formulation: With patient/family Time For Goal Achievement: 02/11/21 Potential to Achieve Goals: Good Progress  towards PT goals: Progressing toward goals    Frequency    Min 3X/week      PT Plan Current plan remains appropriate       AM-PAC PT "6 Clicks" Mobility   Outcome Measure  Help needed turning from your back to your side while in a flat bed without using bedrails?: None Help needed moving from lying on your back to sitting on the side of a flat bed without using bedrails?: A Little Help needed moving to and from a bed to a chair (including a wheelchair)?: A  Little Help needed standing up from a chair using your arms (e.g., wheelchair or bedside chair)?: A Little Help needed to walk in hospital room?: A Little Help needed climbing 3-5 steps with a railing? : A Lot 6 Click Score: 18    End of Session Equipment Utilized During Treatment: Gait belt;Oxygen Activity Tolerance: Patient tolerated treatment well Patient left: with call bell/phone within reach;with family/visitor present;in chair Nurse Communication: Mobility status PT Visit Diagnosis: Muscle weakness (generalized) (M62.81);Difficulty in walking, not elsewhere classified (R26.2)     Time: 4580-9983 PT Time Calculation (min) (ACUTE ONLY): 25 min  Charges:  $Therapeutic Exercise: 8-22 mins $Therapeutic Activity: 8-22 mins                     Chizara Mena B. Migdalia Dk PT, DPT Acute Rehabilitation Services Pager [phone] Office [phone]    [place] 01/30/2021, 2:47 PM

## 2021-01-30 NOTE — Care Management Important Message (Signed)
Important Message  Patient Details  Name: Sean Riley MRN: [id number] Date of Birth: 02-10-1951   Medicare Important Message Given:  Yes - Important Message mailed due to current National Emergency   Verbal consent obtained due to current National Emergency  Relationship to patient: Self Contact Name: Lonnie Call Date: 01/30/21  Time: [place] Phone: [phone] Outcome: No Answer/Busy Important Message mailed to: Patient address on file   [name] 01/30/2021, 3:17 PM

## 2021-01-31 LAB — CBC
HCT: 28.9 % — ABNORMAL LOW (ref 39.0–52.0)
Hemoglobin: 8.9 g/dL — ABNORMAL LOW (ref 13.0–17.0)
MCH: 29.8 pg (ref 26.0–34.0)
MCHC: 30.8 g/dL (ref 30.0–36.0)
MCV: 96.7 fL (ref 80.0–100.0)
Platelets: 287 10*3/uL (ref 150–400)
RBC: 2.99 MIL/uL — ABNORMAL LOW (ref 4.22–5.81)
RDW: 16.3 % — ABNORMAL HIGH (ref 11.5–15.5)
WBC: 12.6 10*3/uL — ABNORMAL HIGH (ref 4.0–10.5)
nRBC: 0 % (ref 0.0–0.2)

## 2021-01-31 LAB — BASIC METABOLIC PANEL
Anion gap: 3 — ABNORMAL LOW (ref 5–15)
BUN: 17 mg/dL (ref 8–23)
CO2: 32 mmol/L (ref 22–32)
Calcium: 8.5 mg/dL — ABNORMAL LOW (ref 8.9–10.3)
Chloride: 99 mmol/L (ref 98–111)
Creatinine, Ser: 1.08 mg/dL (ref 0.61–1.24)
GFR, Estimated: >60 mL/min (ref >60)
Glucose, Bld: 141 mg/dL — ABNORMAL HIGH (ref 70–99)
Potassium: 4.2 mmol/L (ref 3.5–5.1)
Sodium: 134 mmol/L — ABNORMAL LOW (ref 135–145)

## 2021-01-31 LAB — GLUCOSE, CAPILLARY
Glucose-Capillary: 150 mg/dL — ABNORMAL HIGH (ref 70–99)
Glucose-Capillary: 204 mg/dL — ABNORMAL HIGH (ref 70–99)

## 2021-01-31 MED ORDER — TORSEMIDE 20 MG PO TABS: ORAL_TABLET | ORAL | 3 refills | Status: DC

## 2021-01-31 MED ORDER — PREDNISONE 10 MG PO TABS: ORAL_TABLET | ORAL | 0 refills | Status: DC

## 2021-01-31 MED ORDER — IPRATROPIUM-ALBUTEROL 0.5-2.5 (3) MG/3ML IN SOLN: 3.0000 mL | Freq: Two times a day (BID) | RESPIRATORY_TRACT | Status: DC

## 2021-01-31 MED ORDER — DULERA 200-5 MCG/ACT IN AERO: 2.0000 | INHALATION_SPRAY | Freq: Two times a day (BID) | RESPIRATORY_TRACT | 0 refills | Status: DC

## 2021-01-31 MED ORDER — GUAIFENESIN ER 600 MG PO TB12: 600.0000 mg | ORAL_TABLET | Freq: Two times a day (BID) | ORAL | 2 refills | Status: DC

## 2021-01-31 MED ORDER — ALPRAZOLAM 1 MG PO TABS: 1.0000 mg | ORAL_TABLET | Freq: Every day | ORAL | Status: DC

## 2021-01-31 NOTE — Discharge Summary (Signed)
Physician Discharge Summary  Sean Riley FGH:[id number] DOB: May 29, 1951 DOA: 01/24/2021  PCP: Colon Branch, MD  Admit date: 01/24/2021 Discharge date: 01/31/2021  Admitted From: home Disposition:  home  Recommendations for Outpatient Follow-up:  Follow up with PCP in 1-2 weeks Please obtain BMP/CBC in one week Follow up with pulmonologist Dr. Vaughan Browner in the next 2 weeks Follow up with Cardiology Dr. 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  He is chronically on 3 L of oxygen at home increases to 4 to 5 L on ambulation.  He did report having black tarry stools at home.  He was admitted for further management.  Discharge Diagnoses:  Active Problems:   Hyperlipidemia   tobacco use d/o   Atrial fibrillation (HCC)   BPH (benign prostatic hyperplasia)   RBBB   HTN (hypertension)   S/P aortic dissection repair   COPD (chronic obstructive pulmonary disease) (HCC)   Atrial tachycardia (HCC)   Symptomatic anemia   GI bleed   Chest pain   Leukocytosis   CKD (chronic kidney disease)   UTI due to extended-spectrum beta lactamase (ESBL) producing Escherichia coli   Chronic diastolic CHF (congestive heart failure) (HCC)   Acute GI bleeding   Heme + stool  Symptomatic Anemia with GI Bleed -Patient presenting with chest pain, SOB, dizziness. Found to be hypoxic to mid 80s on baseline 3L [place]. -Received 500 cc crystalloid by EMS. HgB 5.0 at presentation. Initially was mildly hypotensive, now hemodynamically stable. -Received additional 500 cc NS bolus in ED. -Remote history of Fe deficiency anemia, not currently on  supplement. -Is on anticoagulation for Afib. -Notes melena at home. FOBT positive in ED. -Colonoscopy in 2017 with precancerous polyps; was recommended to repeat in 3 years. Has not had repeat colonoscopy since. -6/17 transfuse 1 unit PRBC - 6/18 transfuse 3 units PRBC -6/20 s/p colonoscopy/EGD: See findings below -6/20 capsule study ongoing: Swallow pill cam at 1213 -6/21 per GI note negative sites of bleeding identified on colonoscopy, EGD, or capsule study. -Follow-up hemoglobin have remained stable -CT abd negative for any retroperitoneal hematoma. -It was felt to be that he likely had transient GI bleeding from the lesion that was not seen on work-up     Chest Pain -Suspect related to anemia. Troponin normal. EKG does not show any signs of STEMI. -Resolved   Acute on Chronic Diastolic CHF Appears to be hypovolemic at presentation. Last echo EF 60-65% with right ventricular function mildly reduced. -monitor volume status as receives fluids and blood products -strict I/Os -daily weights      Filed Weights    01/28/21 0955 01/29/21 0228 01/29/21 2050  Weight: 91.1 kg 90.7 kg 95.1 kg  -currently, chest xray shows bilateral pleural effusions -BNP minimally elevated -He was treated with IV Lasix with excellent urine output -Appears to be approaching his baseline -Lasix has been changed back to torsemide -His torsemide dose was increased from 60 mg daily to 60 mg in the morning and 40 mg in the evening -He will need to follow-up with heart failure clinic, Dr. Aundra Dubin   Afib -Seen on EKG at admit. HR is mildly elevated, likely related to anemia. --6/21 restart  Eliquis 5 mg  BID, no signs of recurrent bleeding -Metoprolol 12.5 mg BID - 6/21 Flecainide 75 mg BID -6/21 spoke at length with wife and patient concerning warning signs to watch out for since we are going to restart Eliquis.  If patient bleeds again we will have to decide in conjunction with cardiologist on/ if to stop  permanently Eliquis.   COPD -Acute on chronic respiratory failure with hypoxia  - Titrate O2 to maintain SPO2 > 92% -he is chronically on 3-5L of oxygen at home - Not on long-term controller inhaler for COPD -He was previously tried on Breo and then Trelegy, but reportedly did not tolerate either - He was treated with intravenous steroids, inhaled budesonide and bronchodilators -Overall respiratory status has improved -He will need to follow-up with pulmonology to discuss further maintenance therapy -We will keep him on [facility] as maintenance therapy for now   Leukocytosis -WBC 11.4. Had leukocytosis in Jan 2022 to 12 at admission with no clear etiology.  Lactic acid 1.6. Covid and flu neg. -UA see UTI ESBL positive UTI - He was treated with meropenem for 5 days     CKD III Baseline Cr 1.8 Recent Labs       Lab Results  Component Value Date    CREATININE 1.23 01/28/2021    CREATININE 1.34 (H) 01/27/2021    CREATININE 1.44 (H) 01/26/2021    CREATININE 1.77 (H) 01/25/2021    CREATININE 1.82 (H) 01/24/2021    -Better than baseline     Chronic anxiety/depression -Resume home regimen   Hyperlipidemia -Resume home regimen.   Deconditioning - seen by PT/OT with recommendations for home health   Discharge Instructions  Discharge Instructions     Ambulatory referral to Pulmonology   Complete by: As directed    Hospital follow up   Reason for referral: Asthma/COPD   Diet - low sodium heart healthy   Complete by: As directed    For home use only DME Tub bench   Complete by: As directed    For home use only DME Walker rolling   Complete by: As directed    Walker: With [place]   Patient needs a walker to treat with the following condition: Generalized weakness   Increase activity slowly   Complete by: As directed       Allergies as of 01/31/2021       Reactions   Simvastatin Other (See Comments)   Mental status changes   Celecoxib Other (See Comments)    GI UPSET AND INFLAMMATION   Codeine Other (See Comments)   GI UPSET AND INFLAMMATION   Nsaids Other (See Comments)   GI UPSET AND INFLAMMATION (can tolerate via IV)   Tape Other (See Comments)   Medical tape and Band-Aids PULL OFF THE SKIN; please use Coban wrap   Cephalexin Itching, Rash   Latex Rash        Medication List     STOP taking these medications    aspirin 81 MG chewable tablet Commonly known as: Aspirin Childrens   tamoxifen 10 MG tablet Commonly known as: NOLVADEX       TAKE these medications    albuterol 108 (90 Base) MCG/ACT inhaler Commonly known as: ProAir HFA Inhale 2 puffs into the lungs every 4 (four) hours as needed for wheezing or shortness of breath. What changed: Another medication with the same name was removed. Continue taking this medication, and follow the directions you see here.   ALPRAZolam 1  MG tablet Commonly known as: XANAX Take 1 tablet (1 mg total) by mouth at bedtime. What changed: See the new instructions.   augmented betamethasone dipropionate 0.05 % cream Commonly known as: DIPROLENE-AF Apply topically 2 (two) times daily as needed. What changed:  how much to take reasons to take this   bisacodyl 5 MG EC tablet Commonly known as: DULCOLAX Take 5 mg by mouth daily as needed for moderate constipation.   diphenhydrAMINE 25 MG tablet Commonly known as: BENADRYL Take 25 mg by mouth daily as needed (allergies/runny nose).   Dulera 200-5 MCG/ACT Aero Generic drug: mometasone-formoterol Inhale 2 puffs into the lungs in the morning and at bedtime.   Eliquis 5 MG Tabs tablet Generic drug: apixaban Take 1 tablet (5 mg total) by mouth 2 (two) times daily for 1 day.   empagliflozin 10 MG Tabs tablet Commonly known as: Jardiance Take 1 tablet (10 mg total) by mouth daily before breakfast.   escitalopram 10 MG tablet Commonly known as: LEXAPRO TAKE 1 TABLET DAILY What changed: when to take this   esomeprazole 40 MG  capsule Commonly known as: NexIUM Take 1 capsule (40 mg total) by mouth daily before breakfast.   flecainide 50 MG tablet Commonly known as: TAMBOCOR TAKE ONE AND ONE-HALF TABLETS TWICE A DAY What changed:  how much to take how to take this when to take this additional instructions   fluticasone 50 MCG/ACT nasal spray Commonly known as: FLONASE Place 1 spray into both nostrils 2 (two) times daily as needed for allergies. Use one spray in each nostril twice daily. What changed: additional instructions   guaiFENesin 600 MG 12 hr tablet Commonly known as: Mucinex Take 1 tablet (600 mg total) by mouth 2 (two) times daily.   hydroxypropyl methylcellulose / hypromellose 2.5 % ophthalmic solution Commonly known as: ISOPTO TEARS / GONIOVISC Place 1-2 drops into both eyes 3 (three) times daily as needed (dry/irritated eyes).   ipratropium-albuterol 0.5-2.5 (3) MG/3ML Soln Commonly known as: DUONEB Take 3 mLs by nebulization 4 (four) times daily.   Klor-Con M20 20 MEQ tablet Generic drug: potassium chloride SA TAKE 2 TABLETS (40 MEQ) DAILY What changed: See the new instructions.   losartan 25 MG tablet Commonly known as: COZAAR Take 1 tablet (25 mg total) by mouth daily.   magnesium hydroxide 400 MG/5ML suspension Commonly known as: MILK OF MAGNESIA Take 7.5 mLs by mouth daily as needed (stomach cramps/constipation).   metoprolol tartrate 25 MG tablet Commonly known as: LOPRESSOR Take 0.5 tablets (12.5 mg total) by mouth 2 (two) times daily. Please make yearly appt with Dr. Caryl Comes for January 2022 for future refills. Thank you 1st attempt   OXYGEN Inhale 3-4 L/min into the lungs continuous.   predniSONE 10 MG tablet Commonly known as: DELTASONE Take 40mg  po daily for 2 days then 30mg  daily for 2 days then 20mg  daily for 2 days then 10mg  daily for 2 days then stop   rosuvastatin 5 MG tablet Commonly known as: CRESTOR Take 1 tablet (5 mg total) by mouth daily. What  changed: when to take this   torsemide 20 MG tablet Commonly known as: DEMADEX Take 60mg  po qam and 40mg  po qpm What changed:  how much to take how to take this when to take this additional instructions               Durable Medical Equipment  (From admission, onward)           Start  Ordered   01/31/21 1104  For home use only DME Walker rolling  Once       Question Answer Comment  Walker: With [place] Wheels   Patient needs a walker to treat with the following condition Weakness      01/31/21 1104   01/31/21 0000  For home use only DME Walker rolling       Question Answer Comment  Walker: With [place] Wheels   Patient needs a walker to treat with the following condition Generalized weakness      01/31/21 1222   01/31/21 0000  For home use only DME Tub bench        01/31/21 1222            Follow-up [place] [place] Follow up.   Why: For home health services. They will call you in 1-2 days to set up your first home appointment Contact information: [place] [address] [phone]         Colon Branch, MD. Call.   Specialty: Internal Medicine Why: Schedule appointment with PCP within 7 days at your convienece to follow up on hospitalizaiton Contact information: [address] [place] [address] [phone]         [name], MD Follow up.   Specialty: Pulmonary Disease Why: office will contact  you with appointment Contact information: [place] [address] [phone]         Larey Dresser, MD. Schedule an appointment as soon as possible for a visit in 1 week(s).   Specialty: Cardiology Contact information: [address] [place] [address] [phone]                Allergies  Allergen Reactions   Simvastatin Other (See Comments)    Mental status changes   Celecoxib Other (See  Comments)    GI UPSET AND INFLAMMATION   Codeine Other (See Comments)    GI UPSET AND INFLAMMATION   Nsaids Other (See Comments)    GI UPSET AND INFLAMMATION (can tolerate via IV)   Tape Other (See Comments)    Medical tape and Band-Aids PULL OFF THE SKIN; please use Coban wrap   Cephalexin Itching and Rash   Latex Rash    Consultations: gastroenterology   Procedures/Studies: CT ABDOMEN PELVIS WO CONTRAST  Result Date: 01/29/2021 CLINICAL DATA:  70 year old male with anemia. Evaluate for retroperitoneal hematoma. EXAM: CT ABDOMEN AND PELVIS WITHOUT CONTRAST TECHNIQUE: Multidetector CT imaging of the abdomen and pelvis was performed following the standard protocol without IV contrast. COMPARISON:  CT dated 01/15/2021. FINDINGS: Evaluation of this exam is limited in the absence of intravenous contrast. Lower chest: Moderate bilateral pleural effusions similar or slightly increased since the prior CT. There are partial consolidative changes of the visualized lungs which may represent atelectasis or infiltrate. There is coronary vascular calcification. No intra-abdominal free air or free fluid. Hepatobiliary: The liver is unremarkable. No intrahepatic biliary dilatation. Multiple gallstones. No pericholecystic fluid or evidence of acute cholecystitis by CT. Pancreas: Unremarkable. No pancreatic ductal dilatation or surrounding inflammatory changes. Spleen: Normal in size without focal abnormality. Adrenals/Urinary Tract: The adrenal glands unremarkable. The kidneys, visualized ureters appear unremarkable. The urinary bladder is predominantly collapsed. Stomach/Bowel: There is no bowel obstruction or active inflammation. The appendix is not visualized with certainty. No inflammatory changes identified in the right lower quadrant.  Vascular/Lymphatic: Similar appearance of chronic abdominal aortic dissection and partially visualized endovascular stent graft repair of the thoracic aorta similar to prior  CT. Evaluation of the vasculature is limited in the absence of intravenous contrast. No periaortic fluid collection. No intraperitoneal or retroperitoneal hemorrhage. There is advanced atherosclerotic calcification of the aorta. The IVC is unremarkable. No portal venous gas. There is no adenopathy. Reproductive: The prostate and seminal vesicles are grossly unremarkable no pelvic mass. Other: None Musculoskeletal: Degenerative changes of the spine. No acute osseous pathology. IMPRESSION: 1. No acute intra-abdominal or pelvic pathology. No intraperitoneal or retroperitoneal hemorrhage. 2. Moderate bilateral pleural effusions similar or slightly increased since the prior CT. There are partial consolidative changes of the visualized lungs which may represent atelectasis or infiltrate. 3. Cholelithiasis. 4. Similar appearance of chronic abdominal aortic dissection and partially visualized endovascular stent graft repair of the thoracic aorta similar to prior CT. 5. Aortic Atherosclerosis (ICD10-I70.0). Electronically Signed   By: Anner Crete M.D.   On: 01/29/2021 19:12   DG CHEST PORT 1 VIEW  Result Date: 01/29/2021 CLINICAL DATA:  Shortness of breath EXAM: PORTABLE CHEST 1 VIEW COMPARISON:  01/25/2021 FINDINGS: Cardiac shadow remains enlarged. Aortic stent graft is again noted and stable. Bilateral pleural effusions are noted similar to that seen on the prior exam. Basilar infiltrates are again noted and stable as well. No new focal abnormality is seen. IMPRESSION: No significant change from the prior exam of 01/25/2021. Persistent effusion and infiltrates are noted. Electronically Signed   By: Inez Catalina M.D.   On: 01/29/2021 15:34   DG CHEST PORT 1 VIEW  Result Date: 01/25/2021 CLINICAL DATA:  Dyspnea EXAM: PORTABLE CHEST 1 VIEW COMPARISON:  01/24/2021 FINDINGS: Postoperative changes in the mediastinum. Aortic stent. Surgical clips in the right axilla. Cardiac enlargement. Mild vascular congestion.  Bilateral perihilar and basilar infiltrates with bilateral pleural effusions. Appearances are similar to prior study. No pneumothorax. IMPRESSION: Cardiac enlargement, pulmonary vascular congestion, bilateral pulmonary infiltrates and effusions without significant change. Electronically Signed   By: Lucienne Capers M.D.   On: 01/25/2021 21:31   DG Chest Port 1 View  Result Date: 01/24/2021 CLINICAL DATA:  Cough hypoxia EXAM: PORTABLE CHEST 1 VIEW COMPARISON:  08/29/2020, CT 01/15/2021, 04/30/2020, 09/05/2020 FINDINGS: Post sternotomy changes. Aortic stent graft redemonstrated. Clips over the right axillary region. Similar loculated bilateral pleural effusions and airspace disease at the bases. Cardiomegaly. No pneumothorax. IMPRESSION: Overall no significant change compared with recent prior. Cardiomegaly with similar loculated pleural effusions and basilar airspace disease. Electronically Signed   By: Donavan Foil M.D.   On: 01/24/2021 19:23   CT ANGIO CHEST AORTA W/CM & OR WO/CM  Result Date: 01/16/2021 CLINICAL DATA:  70 year old male with history of type a aortic dissection status post aortic root and arch repair with TEVAR. Follow-up study. EXAM: CT ANGIOGRAPHY CHEST, ABDOMEN AND PELVIS TECHNIQUE: Non-contrast CT of the chest was initially obtained. Multidetector CT imaging through the chest, abdomen and pelvis was performed using the standard protocol during bolus administration of intravenous contrast. Multiplanar reconstructed images and MIPs were obtained and reviewed to evaluate the vascular anatomy. CONTRAST:  96mL ISOVUE-370 IOPAMIDOL (ISOVUE-370) INJECTION 76% COMPARISON:  01/29/2020, 11/13/2020 FINDINGS: CTA CHEST FINDINGS VASCULAR Preferential opacification of the thoracic aorta. Similar appearing mild biatrial cardiomegaly, right greater than left. No pericardial effusion. Sinues of Valsalva: Status post aortic valve replacement, unchanged. Ascending Aorta: Status post tube graft aortic root  replacement with reimplantation of the coronary arteries, patent without complicating features. Aortic Arch:  Status post complete arch repair, patent without complicating features. Descending aorta: Stent graft in place extending from the aortic arch to just proximal to the diaphragmatic hiatus. Widely patent. The excluded aneurysm sac measures up to 5.1 x 4.5 cm (previously 5.1 x 4.5 cm) at the level of the carina and 5.0 x 4.0 cm (previously 4.8 x 4.1 cm) in the mid descending thoracic aorta. Again seen is retrograde patency of the false lumen in the distal thoracic aorta. Delayed images of the thoracic stent graft were not obtained, limiting evaluation for endoleak, however no obvious endoleak is visualized. Branch vessels: Widely patent. No significant atherosclerotic changes. Coronary arteries: Reimplanted from the proximal ascending thoracic aortic tube graft. Patent with moderate atherosclerotic calcifications. Main pulmonary artery: 27 mm ,unchanged. Mild prominence of the bilateral main pulmonary arteries, similar to comparison. No evidence of central pulmonary embolism. Interval insertion of CardioMEMS device in the distal left inferior lobar pulmonary artery. Pulmonary veins: No anomalous pulmonary venous return. No evidence of left atrial appendage thrombus. Review of the MIP images confirms the above findings. NON VASCULAR Mediastinum/Nodes: Again seen are multiple scattered prominent mediastinal lymph nodes, none significantly enlarged from 2021 comparison, for example pretracheal measuring up to 17 mm. No enlarged hilar or axillary lymph nodes. Thyroid gland, trachea, and esophagus demonstrate no significant findings. Lungs/Pleura: Similar appearing small bilateral pleural effusions. Unchanged, scattered subpleural consolidative and cicatricial opacities, most prominent in bilateral lower lobes. Moderate paraseptal and centrilobular emphysema, most prominent the upper lobes. Musculoskeletal: No acute  osseous abnormality. Bilateral symmetric gynecomastia. CTA ABDOMEN AND PELVIS FINDINGS VASCULAR Aorta: Similar appearing chronic dissection flap which extends throughout the abdominal aorta in terminates in the proximal left common iliac artery. The false lumen is patent throughout. The total aortic diameter just inferior to the diaphragmatic hiatus measures 4.0 x 3.7 cm. There is similar appearing mild fusiform ectasia of the mid infrarenal abdominal aorta with total aortic diameter measuring up to 3.1 cm. Celiac: Arises from the true lumen. Patent without evidence of aneurysm, dissection, vasculitis or significant stenosis. SMA: Arises from the true lumen. Moderate ostial and proximal stenosis secondary to fibrofatty atherosclerotic plaque, unchanged. Patent distally. Renals: The patent, single right renal artery arises from both the true and false lumen. The patent single left renal artery arises from the true lumen. IMA: Arises from the true lumen. Patent without evidence of aneurysm, dissection, vasculitis or significant stenosis. Inflow: The dissection flap terminates in the proximal left common iliac artery. Otherwise patent with scattered fibrofatty and calcific atherosclerotic calcifications. Veins: No obvious venous abnormality within the limitations of this arterial phase study. Review of the MIP images confirms the above findings. NON-VASCULAR Hepatobiliary: No focal liver abnormality is seen. Layering gallstones are present. No gallbladder wall thickening or pericholecystic fluid. No intra or extrahepatic biliary ductal dilation. Pancreas: Unremarkable. No pancreatic ductal dilatation or surrounding inflammatory changes. Spleen: Normal in size without focal abnormality. Adrenals/Urinary Tract: Adrenal glands are unremarkable. Kidneys are normal, without renal calculi, focal lesion, or hydronephrosis. Bladder is unremarkable. Stomach/Bowel: Stomach is within normal limits. Appendix absent. No evidence of  bowel wall thickening, distention, or inflammatory changes. Lymphatic: No abdominopelvic lymphadenopathy. Reproductive: The prostate is enlarged with internal calcifications, measuring up to 5.5 cm in maximum axial dimension. Other: No abdominal wall hernia or abnormality. No abdominopelvic ascites. Musculoskeletal: No acute or significant osseous findings. IMPRESSION: VASCULAR 1. 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 No complicating features. 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 2. 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 The false lumen is patent throughout. 3. Similar appearing mild fusiform ectasia of the infrarenal abdominal aortic true lumen. Total aortic diameter at this level measures 3.1 cm. 4. Interval insertion of CardioMEMS device in the distal left inferior lobar pulmonary artery. 5. Coronary and aortic atherosclerosis (ICD10-I70.0). NON VASCULAR 1. Similar appearing small bilateral pleural effusions and scattered subpleural consolidative and cicatricial pulmonary opacities. Unchanged scattered prominent mediastinal lymph nodes, likely secondary to chronic inflammatory process. 2.  Emphysema (ICD10-J43.9). 3. Cholelithiasis. 4. Prostatomegaly. Ruthann Cancer, MD [facility] Radiology Electronically Signed   By: Ruthann Cancer MD   On: 01/16/2021 08:07   CT ANGIO ABDOMEN PELVIS  W &/OR WO CONTRAST  Result Date: 01/16/2021 CLINICAL DATA:  70 year old male with history of type a aortic dissection status post aortic root and arch repair with TEVAR. Follow-up study. EXAM: CT ANGIOGRAPHY CHEST, ABDOMEN AND PELVIS TECHNIQUE: Non-contrast CT of the chest was initially obtained. Multidetector CT imaging through the chest, abdomen and pelvis was performed  using the standard protocol during bolus administration of intravenous contrast. Multiplanar reconstructed images and MIPs were obtained and reviewed to evaluate the vascular anatomy. CONTRAST:  30mL ISOVUE-370 IOPAMIDOL (ISOVUE-370) INJECTION 76% COMPARISON:  01/29/2020, 11/13/2020 FINDINGS: CTA CHEST FINDINGS VASCULAR Preferential opacification of the thoracic aorta. Similar appearing mild biatrial cardiomegaly, right greater than left. No pericardial effusion. Sinues of Valsalva: Status post aortic valve replacement, unchanged. Ascending Aorta: Status post tube graft aortic root replacement with reimplantation of the coronary arteries, patent without complicating features. Aortic Arch: Status post complete arch repair, patent without complicating features. Descending aorta: Stent graft in place extending from the aortic arch to just proximal to the diaphragmatic hiatus. Widely patent. The excluded aneurysm sac measures up to 5.1 x 4.5 cm (previously 5.1 x 4.5 cm) at the level of the carina and 5.0 x 4.0 cm (previously 4.8 x 4.1 cm) in the mid descending thoracic aorta. Again seen is retrograde patency of the false lumen in the distal thoracic aorta. Delayed images of the thoracic stent graft were not obtained, limiting evaluation for endoleak, however no obvious endoleak is visualized. Branch vessels: Widely patent. No significant atherosclerotic changes. Coronary arteries: Reimplanted from the proximal ascending thoracic aortic tube graft. Patent with moderate atherosclerotic calcifications. Main pulmonary artery: 27 mm ,unchanged. Mild prominence of the bilateral main pulmonary arteries, similar to comparison. No evidence of central pulmonary embolism. Interval insertion of CardioMEMS device in the distal left inferior lobar pulmonary artery. Pulmonary veins: No anomalous pulmonary venous return. No evidence of left atrial appendage thrombus. Review of the MIP images confirms the above findings. NON VASCULAR  Mediastinum/Nodes: Again seen are multiple scattered prominent mediastinal lymph nodes, none significantly enlarged from 2021 comparison, for example pretracheal measuring up to 17 mm. No enlarged hilar or axillary lymph nodes. Thyroid gland, trachea, and esophagus demonstrate no significant findings. Lungs/Pleura: Similar appearing small bilateral pleural effusions. Unchanged, scattered subpleural consolidative and cicatricial opacities, most prominent in bilateral lower lobes. Moderate paraseptal and centrilobular emphysema, most prominent the upper lobes. Musculoskeletal: No acute osseous abnormality. Bilateral symmetric gynecomastia. CTA ABDOMEN AND PELVIS FINDINGS VASCULAR Aorta: Similar appearing chronic dissection flap which extends throughout the abdominal aorta in terminates in the proximal left common iliac artery. The false lumen is patent throughout. The total aortic diameter just inferior to the diaphragmatic hiatus  measures 4.0 x 3.7 cm. There is similar appearing mild fusiform ectasia of the mid infrarenal abdominal aorta with total aortic diameter measuring up to 3.1 cm. Celiac: Arises from the true lumen. Patent without evidence of aneurysm, dissection, vasculitis or significant stenosis. SMA: Arises from the true lumen. Moderate ostial and proximal stenosis secondary to fibrofatty atherosclerotic plaque, unchanged. Patent distally. Renals: The patent, single right renal artery arises from both the true and false lumen. The patent single left renal artery arises from the true lumen. IMA: Arises from the true lumen. Patent without evidence of aneurysm, dissection, vasculitis or significant stenosis. Inflow: The dissection flap terminates in the proximal left common iliac artery. Otherwise patent with scattered fibrofatty and calcific atherosclerotic calcifications. Veins: No obvious venous abnormality within the limitations of this arterial phase study. Review of the MIP images confirms the above  findings. NON-VASCULAR Hepatobiliary: No focal liver abnormality is seen. Layering gallstones are present. No gallbladder wall thickening or pericholecystic fluid. No intra or extrahepatic biliary ductal dilation. Pancreas: Unremarkable. No pancreatic ductal dilatation or surrounding inflammatory changes. Spleen: Normal in size without focal abnormality. Adrenals/Urinary Tract: Adrenal glands are unremarkable. Kidneys are normal, without renal calculi, focal lesion, or hydronephrosis. Bladder is unremarkable. Stomach/Bowel: Stomach is within normal limits. Appendix absent. No evidence of bowel wall thickening, distention, or inflammatory changes. Lymphatic: No abdominopelvic lymphadenopathy. Reproductive: The prostate is enlarged with internal calcifications, measuring up to 5.5 cm in maximum axial dimension. Other: No abdominal wall hernia or abnormality. No abdominopelvic ascites. Musculoskeletal: No acute or significant osseous findings. IMPRESSION: VASCULAR 1. 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 No complicating features. 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 2. 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 The false lumen is patent throughout. 3. Similar appearing mild fusiform ectasia of the infrarenal abdominal aortic true lumen. Total aortic diameter at this level measures 3.1 cm. 4. Interval insertion of CardioMEMS device in the distal left inferior lobar pulmonary artery. 5. Coronary and aortic atherosclerosis (ICD10-I70.0). NON VASCULAR 1. Similar appearing small bilateral pleural effusions and scattered subpleural consolidative and cicatricial pulmonary opacities. Unchanged scattered prominent mediastinal lymph nodes, likely secondary to chronic inflammatory process. 2.  Emphysema (ICD10-J43.9). 3.  Cholelithiasis. 4. Prostatomegaly. Ruthann Cancer, MD [facility] Electronically Signed   By: Ruthann Cancer MD   On: 01/16/2021 08:07      Subjective: Feeling better, shortness of breath has improved  Discharge Exam: Vitals:   01/30/21 2119 01/31/21 0436 01/31/21 0800 01/31/21 1108  BP: (!) 133/56 (!) 157/71  (!) 131/56  Pulse: 80 64  67  Resp:  20  18  Temp: (!) 97.4 F (36.3 C) (!) 97.5 F (36.4 C)  (!) 97.3 F (36.3 C)  TempSrc: Oral Oral  Oral  SpO2: 94% 94% 97% 96%  Weight:      Height:        General: Pt is alert, awake, not in acute distress Cardiovascular: RRR, S1/S2 +, no rubs, no gallops Respiratory: mild wheeze bilaterally Abdominal: Soft, NT, ND, bowel sounds + Extremities: no edema, no cyanosis    The results of significant diagnostics from this hospitalization (including imaging, microbiology, ancillary and laboratory) are listed below for reference.     Microbiology: Recent Results (from the past 240 hour(s))  Urine culture     Status: Abnormal  Collection Time: 01/24/21  6:29 PM   Specimen: Urine, Random  Result Value Ref Range Status   Specimen Description URINE, RANDOM  Final   Special Requests   Final    NONE Performed at [facility], [place] [address] [place], [place] [address]    Culture (A)  Final    70,000 COLONIES/mL ESCHERICHIA COLI Confirmed Extended Spectrum Beta-Lactamase Producer (ESBL).  In bloodstream infections from ESBL organisms, carbapenems are preferred over piperacillin/tazobactam. They are shown to have a lower risk of mortality.    Report Status 01/27/2021 FINAL  Final   Organism ID, Bacteria ESCHERICHIA COLI (A)  Final      Susceptibility   Escherichia coli - MIC*    AMPICILLIN >=32 RESISTANT Resistant     CEFAZOLIN >=64 RESISTANT Resistant     CEFEPIME 16 RESISTANT Resistant     CEFTRIAXONE >=64 RESISTANT Resistant     CIPROFLOXACIN >=4 RESISTANT Resistant      GENTAMICIN 2 SENSITIVE Sensitive     IMIPENEM <=0.25 SENSITIVE Sensitive     NITROFURANTOIN <=16 SENSITIVE Sensitive     TRIMETH/SULFA >=320 RESISTANT Resistant     AMPICILLIN/SULBACTAM >=32 RESISTANT Resistant     PIP/TAZO 8 SENSITIVE Sensitive     * 70,000 COLONIES/mL ESCHERICHIA COLI  Blood culture (routine x 2)     Status: None   Collection Time: 01/24/21  6:29 PM   Specimen: BLOOD  Result Value Ref Range Status   Specimen Description BLOOD LEFT ANTECUBITAL  Final   Special Requests   Final    BOTTLES DRAWN AEROBIC AND ANAEROBIC Blood Culture results may not be optimal due to an excessive volume of blood received in culture bottles   Culture   Final    NO GROWTH 5 DAYS Performed at [facility], [address], [place], [place] [address]    Report Status 01/29/2021 FINAL  Final  Resp Panel by RT-PCR (Flu A&B, Covid) Nasopharyngeal Swab     Status: None   Collection Time: 01/24/21  6:29 PM   Specimen: Nasopharyngeal Swab; Nasopharyngeal(NP) swabs in vial transport medium  Result Value Ref Range Status   SARS Coronavirus 2 by RT PCR NEGATIVE NEGATIVE Final    Comment: (NOTE) SARS-CoV-2 target nucleic acids are NOT DETECTED.  The SARS-CoV-2 RNA is generally detectable in upper respiratory specimens during the acute phase of infection. The lowest concentration of SARS-CoV-2 viral copies this assay can detect is 138 copies/mL. A negative result does not preclude SARS-Cov-2 infection and should not be used as the sole basis for treatment or other patient management decisions. A negative result may occur with  improper specimen collection/handling, submission of specimen other than nasopharyngeal swab, presence of viral mutation(s) within the areas targeted by this assay, and inadequate number of viral copies(<138 copies/mL). A negative result must be combined with clinical observations, patient history, and epidemiological information. The expected result is  Negative.  Fact Sheet for Patients:  EntrepreneurPulse.com.au  Fact Sheet for Healthcare Providers:  IncredibleEmployment.be  This test is no t yet approved or cleared by the [place] FDA and  has been authorized for detection and/or diagnosis of SARS-CoV-2 by FDA under an Emergency Use Authorization (EUA). This EUA will remain  in effect (meaning this test can be used) for the duration of the COVID-19 declaration under Section 564(b)(1) of the Act, 21 U.S.C.section 360bbb-3(b)(1), unless the authorization is terminated  or revoked sooner.       Influenza A by PCR NEGATIVE NEGATIVE Final  Influenza B by PCR NEGATIVE NEGATIVE Final    Comment: (NOTE) The Xpert Xpress SARS-CoV-2/FLU/RSV plus assay is intended as an aid in the diagnosis of influenza from Nasopharyngeal swab specimens and should not be used as a sole basis for treatment. Nasal washings and aspirates are unacceptable for Xpert Xpress SARS-CoV-2/FLU/RSV testing.  Fact Sheet for Patients: EntrepreneurPulse.com.au  Fact Sheet for Healthcare Providers: IncredibleEmployment.be  This test is not yet approved or cleared by the [place] FDA and has been authorized for detection and/or diagnosis of SARS-CoV-2 by FDA under an Emergency Use Authorization (EUA). This EUA will remain in effect (meaning this test can be used) for the duration of the COVID-19 declaration under Section 564(b)(1) of the Act, 21 U.S.C. section 360bbb-3(b)(1), unless the authorization is terminated or revoked.  Performed at [facility], [place] [address], [place], [place] [address]   Blood culture (routine x 2)     Status: None   Collection Time: 01/24/21  9:10 PM   Specimen: BLOOD  Result Value Ref Range Status   Specimen Description BLOOD RIGHT ARM  Final   Special Requests   Final    BOTTLES DRAWN AEROBIC AND ANAEROBIC Blood Culture adequate volume    Culture   Final    NO GROWTH 5 DAYS Performed at [facility], [place] [address] [place], [place] [address]    Report Status 01/29/2021 FINAL  Final  Culture, blood (single)     Status: None   Collection Time: 01/24/21  9:30 PM   Specimen: BLOOD  Result Value Ref Range Status   Specimen Description BLOOD SITE NOT SPECIFIED  Final   Special Requests   Final    BOTTLES DRAWN AEROBIC AND ANAEROBIC Blood Culture results may not be optimal due to an inadequate volume of blood received in culture bottles   Culture   Final    NO GROWTH 5 DAYS Performed at [facility], [place] [address], [place],  [address]    Report Status 01/30/2021 FINAL  Final     Labs: BNP (last 3 results) Recent Labs    11/15/20 1117 01/24/21 1829 01/29/21 1520  BNP 101.8* 99.3 287.8*   Basic Metabolic Panel: Recent Labs  Lab 01/25/21 0245 01/26/21 0247 01/27/21 0748 01/28/21 0818 01/31/21 0345  NA 136 135 139 133* 134*  K 3.7 3.5 3.5 3.4* 4.2  CL 100 100 104 101 99  CO2 26 27 27 24  32  GLUCOSE 118* 96 96 155* 141*  BUN 43* 25* 15 11 17   CREATININE 1.77* 1.44* 1.34* 1.23 1.08  CALCIUM 8.1* 8.2* 8.5* 8.3* 8.5*  MG  --   --   --  2.1  --   PHOS  --   --   --  3.4  --    Liver Function Tests: Recent Labs  Lab 01/24/21 1829 01/28/21 0818  AST 17 19  ALT 15 14  ALKPHOS 64 65  BILITOT 0.7 1.1  PROT 5.9* 5.8*  ALBUMIN 3.1* 2.9*   No results for input(s): LIPASE, AMYLASE in the last 168 hours. No results for input(s): AMMONIA in the last 168 hours. CBC: Recent Labs  Lab 01/24/21 1829 01/24/21 1845 01/25/21 0245 01/25/21 1324 01/26/21 0247 01/27/21 0748 01/28/21 0818 01/31/21 0345  WBC 11.4*  --  8.2  --  8.1 6.3 6.9 12.6*  NEUTROABS 9.3*  --   --   --  6.4  --  5.4  --   HGB 5.0*   < >  5.6* 7.5* 8.5* 8.9* 9.0* 8.9*  HCT 16.2*   < > 17.9* 22.7* 25.4* 27.6* 28.7* 28.9*  MCV 103.8*  --  101.7*  --  91.7 94.8 95.3 96.7  PLT 232  --  214  --  205 225 227 287   < > = values  in this interval not displayed.   Cardiac Enzymes: No results for input(s): CKTOTAL, CKMB, CKMBINDEX, TROPONINI in the last 168 hours. BNP: Invalid input(s): POCBNP CBG: Recent Labs  Lab 01/30/21 0645 01/30/21 1152 01/30/21 1651 01/31/21 0642 01/31/21 1203  GLUCAP 134* 144* 137* 150* 204*   D-Dimer No results for input(s): DDIMER in the last 72 hours. Hgb A1c No results for input(s): HGBA1C in the last 72 hours. Lipid Profile No results for input(s): CHOL, HDL, LDLCALC, TRIG, CHOLHDL, LDLDIRECT in the last 72 hours. Thyroid function studies No results for input(s): TSH, T4TOTAL, T3FREE, THYROIDAB in the last 72 hours.  Invalid input(s): FREET3 Anemia work up No results for input(s): VITAMINB12, FOLATE, FERRITIN, TIBC, IRON, RETICCTPCT in the last 72 hours. Urinalysis    Component Value Date/Time   COLORURINE STRAW (A) 01/24/2021 1829   APPEARANCEUR CLEAR 01/24/2021 1829   LABSPEC 1.007 01/24/2021 1829   PHURINE 5.0 01/24/2021 1829   GLUCOSEU >=500 (A) 01/24/2021 1829   HGBUR NEGATIVE 01/24/2021 1829   HGBUR small 04/16/2010 0807   BILIRUBINUR NEGATIVE 01/24/2021 1829   BILIRUBINUR Neg 04/21/2012 1414   KETONESUR NEGATIVE 01/24/2021 1829   PROTEINUR NEGATIVE 01/24/2021 1829   UROBILINOGEN 0.2 04/21/2012 1414   UROBILINOGEN negative 04/16/2010 0807   NITRITE NEGATIVE 01/24/2021 1829   LEUKOCYTESUR NEGATIVE 01/24/2021 1829   Sepsis Labs Invalid input(s): PROCALCITONIN,  WBC,  LACTICIDVEN Microbiology Recent Results (from the past 240 hour(s))  Urine culture     Status: Abnormal   Collection Time: 01/24/21  6:29 PM   Specimen: Urine, Random  Result Value Ref Range Status   Specimen Description URINE, RANDOM  Final   Special Requests   Final    NONE Performed at [facility], [place] [address] [place], [place] [address]    Culture (A)  Final    70,000 COLONIES/mL ESCHERICHIA COLI Confirmed Extended Spectrum Beta-Lactamase Producer (ESBL).  In bloodstream  infections from ESBL organisms, carbapenems are preferred over piperacillin/tazobactam. They are shown to have a lower risk of mortality.    Report Status 01/27/2021 FINAL  Final   Organism ID, Bacteria ESCHERICHIA COLI (A)  Final      Susceptibility   Escherichia coli - MIC*    AMPICILLIN >=32 RESISTANT Resistant     CEFAZOLIN >=64 RESISTANT Resistant     CEFEPIME 16 RESISTANT Resistant     CEFTRIAXONE >=64 RESISTANT Resistant     CIPROFLOXACIN >=4 RESISTANT Resistant     GENTAMICIN 2 SENSITIVE Sensitive     IMIPENEM <=0.25 SENSITIVE Sensitive     NITROFURANTOIN <=16 SENSITIVE Sensitive     TRIMETH/SULFA >=320 RESISTANT Resistant     AMPICILLIN/SULBACTAM >=32 RESISTANT Resistant     PIP/TAZO 8 SENSITIVE Sensitive     * 70,000 COLONIES/mL ESCHERICHIA COLI  Blood culture (routine x 2)     Status: None   Collection Time: 01/24/21  6:29 PM   Specimen: BLOOD  Result Value Ref Range Status   Specimen Description BLOOD LEFT ANTECUBITAL  Final   Special Requests   Final    BOTTLES DRAWN AEROBIC AND ANAEROBIC Blood Culture results may not be optimal due to an excessive volume of blood received in  culture bottles   Culture   Final    NO GROWTH 5 DAYS Performed at [facility], [place] [address], [place], [place] [address]    Report Status 01/29/2021 FINAL  Final  Resp Panel by RT-PCR (Flu A&B, Covid) Nasopharyngeal Swab     Status: None   Collection Time: 01/24/21  6:29 PM   Specimen: Nasopharyngeal Swab; Nasopharyngeal(NP) swabs in vial transport medium  Result Value Ref Range Status   SARS Coronavirus 2 by RT PCR NEGATIVE NEGATIVE Final    Comment: (NOTE) SARS-CoV-2 target nucleic acids are NOT DETECTED.  The SARS-CoV-2 RNA is generally detectable in upper respiratory specimens during the acute phase of infection. The lowest concentration of SARS-CoV-2 viral copies this assay can detect is 138 copies/mL. A negative result does not preclude SARS-Cov-2 infection and should not be  used as the sole basis for treatment or other patient management decisions. A negative result may occur with  improper specimen collection/handling, submission of specimen other than nasopharyngeal swab, presence of viral mutation(s) within the areas targeted by this assay, and inadequate number of viral copies(<138 copies/mL). A negative result must be combined with clinical observations, patient history, and epidemiological information. The expected result is Negative.  Fact Sheet for Patients:  EntrepreneurPulse.com.au  Fact Sheet for Healthcare Providers:  IncredibleEmployment.be  This test is no t yet approved or cleared by the [place] FDA and  has been authorized for detection and/or diagnosis of SARS-CoV-2 by FDA under an Emergency Use Authorization (EUA). This EUA will remain  in effect (meaning this test can be used) for the duration of the COVID-19 declaration under Section 564(b)(1) of the Act, 21 U.S.C.section 360bbb-3(b)(1), unless the authorization is terminated  or revoked sooner.       Influenza A by PCR NEGATIVE NEGATIVE Final   Influenza B by PCR NEGATIVE NEGATIVE Final    Comment: (NOTE) The Xpert Xpress SARS-CoV-2/FLU/RSV plus assay is intended as an aid in the diagnosis of influenza from Nasopharyngeal swab specimens and should not be used as a sole basis for treatment. Nasal washings and aspirates are unacceptable for Xpert Xpress SARS-CoV-2/FLU/RSV testing.  Fact Sheet for Patients: EntrepreneurPulse.com.au  Fact Sheet for Healthcare Providers: IncredibleEmployment.be  This test is not yet approved or cleared by the [place] FDA and has been authorized for detection and/or diagnosis of SARS-CoV-2 by FDA under an Emergency Use Authorization (EUA). This EUA will remain in effect (meaning this test can be used) for the duration of the COVID-19 declaration under Section  564(b)(1) of the Act, 21 U.S.C. section 360bbb-3(b)(1), unless the authorization is terminated or revoked.  Performed at [facility], [place] [address], [place], [place] [address]   Blood culture (routine x 2)     Status: None   Collection Time: 01/24/21  9:10 PM   Specimen: BLOOD  Result Value Ref Range Status   Specimen Description BLOOD RIGHT ARM  Final   Special Requests   Final    BOTTLES DRAWN AEROBIC AND ANAEROBIC Blood Culture adequate volume   Culture   Final    NO GROWTH 5 DAYS Performed at [facility], [place] [address] [place], [place] [address]    Report Status 01/29/2021 FINAL  Final  Culture, blood (single)     Status: None   Collection Time: 01/24/21  9:30 PM   Specimen: BLOOD  Result Value Ref Range Status   Specimen Description BLOOD SITE NOT SPECIFIED  Final   Special Requests  Final    BOTTLES DRAWN AEROBIC AND ANAEROBIC Blood Culture results may not be optimal due to an inadequate volume of blood received in culture bottles   Culture   Final    NO GROWTH 5 DAYS Performed at [facility], [place] [address], [place], [name] [address]    Report Status 01/30/2021 FINAL  Final     Time coordinating discharge: 58mins  SIGNED:   Kathie Dike, MD  [facility] 01/31/2021, 12:25 PM   If 7PM-7AM, please contact night-coverage [email or web address]

## 2021-01-31 NOTE — Progress Notes (Signed)
Discharge instructions, RX's and follow up appts explained and provided to patient and wife verbalized understanding. Patient left floor via wheelchair accompanied be staff no c/o pain or shortness of breath at d/c.  Hakan Nudelman, Tivis Ringer, RN

## 2021-01-31 NOTE — Plan of Care (Signed)
  Problem: Health Behavior/Discharge Planning: Goal: Ability to manage health-related needs will improve Outcome: Adequate for Discharge   Problem: Clinical Measurements: Goal: Ability to maintain clinical measurements within normal limits will improve Outcome: Adequate for Discharge Goal: Will remain free from infection Outcome: Adequate for Discharge Goal: Diagnostic test results will improve Outcome: Adequate for Discharge Goal: Respiratory complications will improve Outcome: Adequate for Discharge Goal: Cardiovascular complication will be avoided Outcome: Adequate for Discharge   Problem: Activity: Goal: Risk for activity intolerance will decrease Outcome: Adequate for Discharge   Problem: Nutrition: Goal: Adequate nutrition will be maintained Outcome: Adequate for Discharge   Problem: Coping: Goal: Level of anxiety will decrease Outcome: Adequate for Discharge   Problem: Elimination: Goal: Will not experience complications related to bowel motility Outcome: Adequate for Discharge Goal: Will not experience complications related to urinary retention Outcome: Adequate for Discharge   Problem: Pain Managment: Goal: General experience of comfort will improve Outcome: Adequate for Discharge   Problem: Safety: Goal: Ability to remain free from injury will improve Outcome: Adequate for Discharge   Problem: Skin Integrity: Goal: Risk for impaired skin integrity will decrease Outcome: Adequate for Discharge   Problem: Education: Goal: Ability to identify signs and symptoms of gastrointestinal bleeding will improve Outcome: Adequate for Discharge   Problem: Bowel/Gastric: Goal: Will show no signs and symptoms of gastrointestinal bleeding Outcome: Adequate for Discharge   Problem: Fluid Volume: Goal: Will show no signs and symptoms of excessive bleeding Outcome: Adequate for Discharge   Problem: Clinical Measurements: Goal: Complications related to the disease  process, condition or treatment will be avoided or minimized Outcome: Adequate for Discharge

## 2021-01-31 NOTE — Plan of Care (Signed)
Problem: Health Behavior/Discharge Planning: Goal: Ability to manage health-related needs will improve 01/31/2021 1336 by Emmaline Life, RN Outcome: Adequate for Discharge 01/31/2021 1336 by Emmaline Life, RN Outcome: Adequate for Discharge   Problem: Clinical Measurements: Goal: Ability to maintain clinical measurements within normal limits will improve 01/31/2021 1336 by Emmaline Life, RN Outcome: Adequate for Discharge 01/31/2021 1336 by Emmaline Life, RN Outcome: Adequate for Discharge Goal: Will remain free from infection 01/31/2021 1336 by Emmaline Life, RN Outcome: Adequate for Discharge 01/31/2021 1336 by Emmaline Life, RN Outcome: Adequate for Discharge Goal: Diagnostic test results will improve 01/31/2021 1336 by Emmaline Life, RN Outcome: Adequate for Discharge 01/31/2021 1336 by Emmaline Life, RN Outcome: Adequate for Discharge Goal: Respiratory complications will improve 01/31/2021 1336 by Emmaline Life, RN Outcome: Adequate for Discharge 01/31/2021 1336 by Emmaline Life, RN Outcome: Adequate for Discharge Goal: Cardiovascular complication will be avoided 01/31/2021 1336 by Emmaline Life, RN Outcome: Adequate for Discharge 01/31/2021 1336 by Emmaline Life, RN Outcome: Adequate for Discharge   Problem: Activity: Goal: Risk for activity intolerance will decrease 01/31/2021 1336 by Emmaline Life, RN Outcome: Adequate for Discharge 01/31/2021 1336 by Emmaline Life, RN Outcome: Adequate for Discharge   Problem: Nutrition: Goal: Adequate nutrition will be maintained 01/31/2021 1336 by Emmaline Life, RN Outcome: Adequate for Discharge 01/31/2021 1336 by Emmaline Life, RN Outcome: Adequate for Discharge   Problem: Coping: Goal: Level of anxiety will decrease 01/31/2021 1336 by Emmaline Life, RN Outcome: Adequate for Discharge 01/31/2021 1336 by Emmaline Life, RN Outcome: Adequate for Discharge    Problem: Elimination: Goal: Will not experience complications related to bowel motility 01/31/2021 1336 by Emmaline Life, RN Outcome: Adequate for Discharge 01/31/2021 1336 by Emmaline Life, RN Outcome: Adequate for Discharge Goal: Will not experience complications related to urinary retention 01/31/2021 1336 by Emmaline Life, RN Outcome: Adequate for Discharge 01/31/2021 1336 by Emmaline Life, RN Outcome: Adequate for Discharge   Problem: Pain Managment: Goal: General experience of comfort will improve 01/31/2021 1336 by Emmaline Life, RN Outcome: Adequate for Discharge 01/31/2021 1336 by Emmaline Life, RN Outcome: Adequate for Discharge   Problem: Safety: Goal: Ability to remain free from injury will improve 01/31/2021 1336 by Emmaline Life, RN Outcome: Adequate for Discharge 01/31/2021 1336 by Emmaline Life, RN Outcome: Adequate for Discharge   Problem: Skin Integrity: Goal: Risk for impaired skin integrity will decrease 01/31/2021 1336 by Emmaline Life, RN Outcome: Adequate for Discharge 01/31/2021 1336 by Emmaline Life, RN Outcome: Adequate for Discharge   Problem: Education: Goal: Ability to identify signs and symptoms of gastrointestinal bleeding will improve 01/31/2021 1336 by Emmaline Life, RN Outcome: Adequate for Discharge 01/31/2021 1336 by Emmaline Life, RN Outcome: Adequate for Discharge   Problem: Bowel/Gastric: Goal: Will show no signs and symptoms of gastrointestinal bleeding 01/31/2021 1336 by Emmaline Life, RN Outcome: Adequate for Discharge 01/31/2021 1336 by Emmaline Life, RN Outcome: Adequate for Discharge   Problem: Fluid Volume: Goal: Will show no signs and symptoms of excessive bleeding 01/31/2021 1336 by Emmaline Life, RN Outcome: Adequate for Discharge 01/31/2021 1336 by Emmaline Life, RN Outcome: Adequate for Discharge   Problem: Clinical Measurements: Goal: Complications related to  the disease process, condition or treatment will be avoided or minimized 01/31/2021 1336 by Emmaline Life, RN Outcome: Adequate for Discharge 01/31/2021 1336 by Emmaline Life,  RN Outcome: Adequate for Discharge   Problem: Acute Rehab PT Goals(only PT should resolve) Goal: Patient Will Transfer Sit To/From Stand Outcome: Adequate for Discharge Goal: Pt Will Ambulate Outcome: Adequate for Discharge Goal: Pt Will Go Up/Down Stairs Outcome: Adequate for Discharge   Problem: Acute Rehab OT Goals (only OT should resolve) Goal: Pt. Will Perform Lower Body Bathing Outcome: Adequate for Discharge Goal: Pt. Will Perform Lower Body Dressing Outcome: Adequate for Discharge Goal: Pt. Will Perform Toileting-Clothing Manipulation Outcome: Adequate for Discharge Goal: Pt. Will Perform Tub/Shower Transfer Outcome: Adequate for Discharge Goal: OT Additional ADL Goal #1 Outcome: Adequate for Discharge Goal: OT Additional ADL Goal #2 Outcome: Adequate for Discharge

## 2021-01-31 NOTE — Plan of Care (Signed)
  Problem: Clinical Measurements: Goal: Cardiovascular complication will be avoided Outcome: Progressing   Problem: Safety: Goal: Ability to remain free from injury will improve Outcome: Progressing   Problem: Skin Integrity: Goal: Risk for impaired skin integrity will decrease Outcome: Progressing   

## 2021-01-31 NOTE — Progress Notes (Signed)
Occupational Therapy Treatment Patient Details Name: Sean Riley MRN: [id number] DOB: [birth year] Today's Date: 01/31/2021    History of present illness Pt is a 70 y.o. M who presents with sympomatic anemia with GIB. 6/20 s/p colonoscopy with polyp removal and diverticulosis noted in the sigmoid colon and descending colon. Significant PMH: A. 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   OT comments  Pt making steady progress towards OT goals this session. Pt continues to present with impaired activity tolerance and generalized deconditioning but making progress towards pts OT goals with pt able to complete ~ 60 ft of functional mobility with RW and min guard assist on 4L [place] with SpO2 >/= 94%. Pt required one seated rest break after ~ 10 ft d/t fatigue. Education provided on energy conservation strategies for home with pt and wife verbalizing understanding. Reviewed set -up of shower seat for home with wife reporting that she can assist as needed with ADLs. Pt reports his current RW does not fit into his bathroom, therefore updated DC recs to include RW. Pt would continue to benefit from skilled occupational therapy while admitted and after d/c to address the below listed limitations in order to improve overall functional mobility and facilitate independence with BADL participation. DC plan remains appropriate, will follow acutely per POC.     Follow Up Recommendations  Home health OT    Equipment Recommendations  Tub/shower bench;Wheelchair (measurements OT);Other (comment) (Tub transfer bench, Systems analyst, RW)    Recommendations for Other Services      Precautions / Restrictions Precautions Precautions: Fall;Other (comment) Precaution Comments: watch O2 Restrictions Weight Bearing Restrictions: No       Mobility Bed Mobility Overal bed mobility: Modified Independent             General bed mobility comments: Supine<>Sit     Transfers Overall transfer level: Needs assistance Equipment used: Rolling walker (2 wheeled) Transfers: Sit to/from [employer] Transfers Sit to Stand: Min guard;Min assist;From elevated surface Stand pivot transfers: Min assist       General transfer comment: MIN A initially to rise from EOB as pt preferring to pull up on RW, pt also requesting EOB to be elevated as they have high bed at home, however pt able to stand from recliner with min guard assist. MIN A for initial stand pivot transfer from EOB >recliner needing cues for safety awareness and cues for hand placement as pt mildly resistant to reaching back to sitting surface    Balance Overall balance assessment: Needs assistance Sitting-balance support: Feet supported Sitting balance-[name]: Good     Standing balance support: Bilateral upper extremity supported Standing balance-[name]: Poor Standing balance comment: reliant on external support                           ADL either performed or assessed with clinical judgement   ADL Overall ADL's : Needs assistance/impaired                         Toilet Transfer: Min guard;Minimal assistance;RW;Ambulation;Stand-pivot Armed forces technical officer Details (indicate cue type and reason): pt initially required MIN A for simulated stand pivot from EOB>recliner needinc cues for safety and hand  placment, however pt progressed to minguard assist with simulated toilet transfer via functional mobility with RW         Functional mobility during ADLs: Minimal assistance;Min guard;Rolling walker;Cueing for  safety General ADL Comments: pt continunes to present with impaired activity tolerance, and generalized deconditioning     Vision       Perception     Praxis      Cognition Arousal/Alertness: Awake/alert Behavior During Therapy: WFL for tasks assessed/performed Overall Cognitive Status: Within Functional Limits for tasks assessed                                  General Comments: mild impaired safety awareness        Exercises     Shoulder Instructions       General Comments wife present during session, pt on 3L upon arrival, increased O2 to 4L [place] during functional mobility with SpO2 >/= 94% during mobility HR ranging in 100s with mobility. able to wean pt back down to 3L [place] at end of session    Pertinent Vitals/ Pain       Pain Assessment: No/denies pain  Home Living                                          Prior Functioning/Environment              Frequency  Min 2X/week        Progress Toward Goals  OT Goals(current goals can now be found in the care plan section)  Progress towards OT goals: Progressing toward goals  Acute Rehab OT Goals Patient Stated Goal: to go home OT Goal Formulation: With patient Time For Goal Achievement: 02/12/21 Potential to Achieve Goals: Good  Plan Discharge plan remains appropriate;Frequency remains appropriate    Co-evaluation                 AM-PAC OT "6 Clicks" Daily Activity     Outcome Measure   Help from another person eating meals?: None Help from another person taking care of personal grooming?: A Little Help from another person toileting, which includes using toliet, bedpan, or urinal?: A Little Help from another person bathing (including washing, rinsing, drying)?: A Little Help from another person to put on and taking off regular upper body clothing?: None Help from another person to put on and taking off regular lower body clothing?: A Little 6 Click Score: 20    End of Session Equipment Utilized During Treatment: Gait belt;Rolling walker;Oxygen;Other (comment) (3-4L [place])  OT Visit Diagnosis: Unsteadiness on feet (R26.81)   Activity Tolerance Patient tolerated treatment well   Patient Left in chair;with call bell/phone within reach;with family/visitor present   Nurse Communication Mobility status;Other  (comment) (via secure chat, +1 back to bed)        Time: 0911-1000 OT Time Calculation (min): 49 min  Charges: OT General Charges $OT Visit: 1 Visit OT Treatments $Therapeutic Activity: 38-52 mins  Harley Alto., COTA/L Acute Rehabilitation Services [phone]    Precious Haws 01/31/2021, 10:46 AM

## 2021-02-01 DIAGNOSIS — I25110 Atherosclerotic heart disease of native coronary artery with unstable angina pectoris: Secondary | ICD-10-CM | POA: Diagnosis not present

## 2021-02-01 DIAGNOSIS — Z87440 Personal history of urinary (tract) infections: Secondary | ICD-10-CM | POA: Diagnosis not present

## 2021-02-01 DIAGNOSIS — Z952 Presence of prosthetic heart valve: Secondary | ICD-10-CM | POA: Diagnosis not present

## 2021-02-01 DIAGNOSIS — Z86010 Personal history of colonic polyps: Secondary | ICD-10-CM | POA: Diagnosis not present

## 2021-02-01 DIAGNOSIS — I4819 Other persistent atrial fibrillation: Secondary | ICD-10-CM | POA: Diagnosis not present

## 2021-02-01 DIAGNOSIS — K5730 Diverticulosis of large intestine without perforation or abscess without bleeding: Secondary | ICD-10-CM | POA: Diagnosis not present

## 2021-02-01 DIAGNOSIS — D62 Acute posthemorrhagic anemia: Secondary | ICD-10-CM | POA: Diagnosis not present

## 2021-02-01 DIAGNOSIS — Z9049 Acquired absence of other specified parts of digestive tract: Secondary | ICD-10-CM | POA: Diagnosis not present

## 2021-02-01 DIAGNOSIS — M1990 Unspecified osteoarthritis, unspecified site: Secondary | ICD-10-CM | POA: Diagnosis not present

## 2021-02-01 DIAGNOSIS — J449 Chronic obstructive pulmonary disease, unspecified: Secondary | ICD-10-CM | POA: Diagnosis not present

## 2021-02-01 DIAGNOSIS — R7301 Impaired fasting glucose: Secondary | ICD-10-CM | POA: Diagnosis not present

## 2021-02-01 DIAGNOSIS — Z981 Arthrodesis status: Secondary | ICD-10-CM | POA: Diagnosis not present

## 2021-02-01 DIAGNOSIS — F32A Depression, unspecified: Secondary | ICD-10-CM | POA: Diagnosis not present

## 2021-02-01 DIAGNOSIS — G4733 Obstructive sleep apnea (adult) (pediatric): Secondary | ICD-10-CM | POA: Diagnosis not present

## 2021-02-01 DIAGNOSIS — I130 Hypertensive heart and chronic kidney disease with heart failure and stage 1 through stage 4 chronic kidney disease, or unspecified chronic kidney disease: Secondary | ICD-10-CM | POA: Diagnosis not present

## 2021-02-01 DIAGNOSIS — I5033 Acute on chronic diastolic (congestive) heart failure: Secondary | ICD-10-CM | POA: Diagnosis not present

## 2021-02-01 DIAGNOSIS — K648 Other hemorrhoids: Secondary | ICD-10-CM | POA: Diagnosis not present

## 2021-02-01 DIAGNOSIS — I4510 Unspecified right bundle-branch block: Secondary | ICD-10-CM | POA: Diagnosis not present

## 2021-02-01 DIAGNOSIS — K219 Gastro-esophageal reflux disease without esophagitis: Secondary | ICD-10-CM | POA: Diagnosis not present

## 2021-02-01 DIAGNOSIS — J9621 Acute and chronic respiratory failure with hypoxia: Secondary | ICD-10-CM | POA: Diagnosis not present

## 2021-02-01 DIAGNOSIS — N400 Enlarged prostate without lower urinary tract symptoms: Secondary | ICD-10-CM | POA: Diagnosis not present

## 2021-02-01 DIAGNOSIS — E785 Hyperlipidemia, unspecified: Secondary | ICD-10-CM | POA: Diagnosis not present

## 2021-02-01 DIAGNOSIS — N183 Chronic kidney disease, stage 3 unspecified: Secondary | ICD-10-CM | POA: Diagnosis not present

## 2021-02-01 DIAGNOSIS — F419 Anxiety disorder, unspecified: Secondary | ICD-10-CM | POA: Diagnosis not present

## 2021-02-01 DIAGNOSIS — I8390 Asymptomatic varicose veins of unspecified lower extremity: Secondary | ICD-10-CM | POA: Diagnosis not present

## 2021-02-04 ENCOUNTER — Telehealth: Payer: Self-pay

## 2021-02-04 DIAGNOSIS — I25110 Atherosclerotic heart disease of native coronary artery with unstable angina pectoris: Secondary | ICD-10-CM | POA: Diagnosis not present

## 2021-02-04 DIAGNOSIS — N183 Chronic kidney disease, stage 3 unspecified: Secondary | ICD-10-CM | POA: Diagnosis not present

## 2021-02-04 DIAGNOSIS — J449 Chronic obstructive pulmonary disease, unspecified: Secondary | ICD-10-CM | POA: Diagnosis not present

## 2021-02-04 DIAGNOSIS — I130 Hypertensive heart and chronic kidney disease with heart failure and stage 1 through stage 4 chronic kidney disease, or unspecified chronic kidney disease: Secondary | ICD-10-CM | POA: Diagnosis not present

## 2021-02-04 DIAGNOSIS — I5033 Acute on chronic diastolic (congestive) heart failure: Secondary | ICD-10-CM | POA: Diagnosis not present

## 2021-02-04 DIAGNOSIS — J9621 Acute and chronic respiratory failure with hypoxia: Secondary | ICD-10-CM | POA: Diagnosis not present

## 2021-02-04 NOTE — Telephone Encounter (Signed)
Transition Care Management Follow-up Telephone Call Date of discharge and from where: 01/31/2021[facility] How have you been since you were released from the hospital? Doing better but feeling weak. Any questions or concerns? No  Items Reviewed: Did the pt receive and understand the discharge instructions provided? Yes  Medications obtained and verified? Yes  Other? Yes  Any new allergies since your discharge? No  Dietary orders reviewed? No Do you have support at home? Yes   Home Care and Equipment/Supplies: Were home health services ordered? yes If so, what is the name of the agency? [facility]  Has the agency set up a time to come to the patient's home? yes Were any new equipment or medical supplies ordered?  Yes: Walker, tub bench What is the name of the medical supply agency? [facility] Were you able to get the supplies/equipment? yes Do you have any questions related to the use of the equipment or supplies? No  Functional Questionnaire: (I = Independent and D = Dependent) ADLs: I with assistance  Bathing/Dressing- I with assistance  Meal Prep- D  Eating- I  Maintaining continence- I  Transferring/Ambulation- I with walker  Managing Meds- D  Follow up appointments reviewed:  PCP Hospital f/u appt confirmed? Yes  Scheduled to see Dr. Larose Kells on 02/13/21 @ 11:20. [facility] f/u appt confirmed? Yes  Scheduled to see [place] on 02/26/21 @ 9:00. Are transportation arrangements needed? No  If their condition worsens, is the pt aware to call PCP or go to the Emergency Dept.? Yes Was the patient provided with contact information for the PCP's office or ED? Yes Was to pt encouraged to call back with questions or concerns? Yes

## 2021-02-05 ENCOUNTER — Encounter: Payer: Self-pay | Admitting: Internal Medicine

## 2021-02-06 ENCOUNTER — Inpatient Hospital Stay: Payer: Medicare Other | Admitting: Internal Medicine

## 2021-02-11 DIAGNOSIS — I25110 Atherosclerotic heart disease of native coronary artery with unstable angina pectoris: Secondary | ICD-10-CM | POA: Diagnosis not present

## 2021-02-11 DIAGNOSIS — I130 Hypertensive heart and chronic kidney disease with heart failure and stage 1 through stage 4 chronic kidney disease, or unspecified chronic kidney disease: Secondary | ICD-10-CM | POA: Diagnosis not present

## 2021-02-11 DIAGNOSIS — J9621 Acute and chronic respiratory failure with hypoxia: Secondary | ICD-10-CM | POA: Diagnosis not present

## 2021-02-11 DIAGNOSIS — J449 Chronic obstructive pulmonary disease, unspecified: Secondary | ICD-10-CM | POA: Diagnosis not present

## 2021-02-11 DIAGNOSIS — N183 Chronic kidney disease, stage 3 unspecified: Secondary | ICD-10-CM | POA: Diagnosis not present

## 2021-02-11 DIAGNOSIS — I5033 Acute on chronic diastolic (congestive) heart failure: Secondary | ICD-10-CM | POA: Diagnosis not present

## 2021-02-13 ENCOUNTER — Encounter: Payer: Self-pay | Admitting: Internal Medicine

## 2021-02-13 ENCOUNTER — Ambulatory Visit (INDEPENDENT_AMBULATORY_CARE_PROVIDER_SITE_OTHER): Payer: Medicare Other | Admitting: Internal Medicine

## 2021-02-13 ENCOUNTER — Other Ambulatory Visit: Payer: Self-pay

## 2021-02-13 VITALS — BP 112/70 | HR 80 | Temp 97.7°F | Resp 18 | Ht 72.0 in | Wt 188.4 lb

## 2021-02-13 DIAGNOSIS — G4700 Insomnia, unspecified: Secondary | ICD-10-CM | POA: Diagnosis not present

## 2021-02-13 DIAGNOSIS — D649 Anemia, unspecified: Secondary | ICD-10-CM

## 2021-02-13 DIAGNOSIS — I480 Paroxysmal atrial fibrillation: Secondary | ICD-10-CM

## 2021-02-13 DIAGNOSIS — J449 Chronic obstructive pulmonary disease, unspecified: Secondary | ICD-10-CM

## 2021-02-13 LAB — BASIC METABOLIC PANEL
BUN: 37 mg/dL — ABNORMAL HIGH (ref 6–23)
CO2: 30 mEq/L (ref 19–32)
Calcium: 9 mg/dL (ref 8.4–10.5)
Chloride: 98 mEq/L (ref 96–112)
Creatinine, Ser: 1.57 mg/dL — ABNORMAL HIGH (ref 0.40–1.50)
GFR: 44.16 mL/min — ABNORMAL LOW (ref >60.00)
Glucose, Bld: 78 mg/dL (ref 70–99)
Potassium: 3.7 mEq/L (ref 3.5–5.1)
Sodium: 137 mEq/L (ref 135–145)

## 2021-02-13 LAB — CBC WITH DIFFERENTIAL/PLATELET
Basophils Absolute: 0.1 10*3/uL (ref 0.0–0.1)
Basophils Relative: 0.7 % (ref 0.0–3.0)
Eosinophils Absolute: 0.3 10*3/uL (ref 0.0–0.7)
Eosinophils Relative: 3.2 % (ref 0.0–5.0)
HCT: 32.6 % — ABNORMAL LOW (ref 39.0–52.0)
Hemoglobin: 10.6 g/dL — ABNORMAL LOW (ref 13.0–17.0)
Lymphocytes Relative: 7.5 % — ABNORMAL LOW (ref 12.0–46.0)
Lymphs Abs: 0.6 10*3/uL — ABNORMAL LOW (ref 0.7–4.0)
MCHC: 32.7 g/dL (ref 30.0–36.0)
MCV: 86.9 fl (ref 78.0–100.0)
Monocytes Absolute: 0.9 10*3/uL (ref 0.1–1.0)
Monocytes Relative: 10.1 % (ref 3.0–12.0)
Neutro Abs: 6.6 10*3/uL (ref 1.4–7.7)
Neutrophils Relative %: 78.5 % — ABNORMAL HIGH (ref 43.0–77.0)
Platelets: 263 10*3/uL (ref 150.0–400.0)
RBC: 3.75 Mil/uL — ABNORMAL LOW (ref 4.22–5.81)
RDW: 16.8 % — ABNORMAL HIGH (ref 11.5–15.5)
WBC: 8.4 10*3/uL (ref 4.0–10.5)

## 2021-02-13 LAB — IRON: Iron: 47 ug/dL (ref 42–165)

## 2021-02-13 LAB — FERRITIN: Ferritin: 41.7 ng/mL (ref 22.0–322.0)

## 2021-02-13 MED ORDER — ALPRAZOLAM 1 MG PO TABS: 1.0000 mg | ORAL_TABLET | Freq: Every day | ORAL | 2 refills | Status: DC

## 2021-02-13 NOTE — Patient Instructions (Addendum)
Check the  blood pressure regularly  BP GOAL is between 110/65 and  135/85. If it is consistently higher or lower, let me know  Watch for signs of bleeding: Change in the color of the stools, stomach pain, nausea vomiting, looking pale, feeling extremely tired.  GO TO THE LAB : Get the blood work     [place], [place] back for a checkup in 3 months

## 2021-02-13 NOTE — Progress Notes (Signed)
Subjective:    Patient ID: Sean Riley, male    DOB: [birth year], 70 y.o.   MRN: [id number]  DOS:  02/13/2021 Type of visit - description: TCM 14  Admitted to the hospital 01/24/2021, discharge 01/31/2021 Presented with difficulty breathing, chest pressure and dizziness, upon arrival he was found to have a hemoglobin of 5. Hemoccult was positive at the ED. Receive IV fluids, 4 PRBCs. Had a endoscopy and EGD as well as a capsule endoscopy.  Tests did not show a site of bleeding. CT abdomen with no retroperitoneal hematoma. Medications were adjusted. Eliquis was restarted before discharge. O2 sat was maintained around 92% normal.   Review of Systems Since he left the hospital he is back home. The patient and the wife reports that he is doing better. Good p.o. tolerance, he is getting stronger. No chest pain, difficulty breathing or lower extremity edema No nausea, vomiting or diarrhea.  No blood in the stools.  No abdominal pain. O2 sats in the 90s, decrease to the 80s on either 70s with exertion.  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   Atrial fibrillation (HCC)    Cataract    removed both eyes    CHF (congestive heart failure) (HCC)    GERD (gastroesophageal reflux disease)    HYPERLIPIDEMIA    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 LUTS    Hypertension    Microscopic hematuria    negative cystoscopy   NONSPECIFIC ABNORMAL ELECTROCARDIOGRAM    Pleural effusion on right    Pleural effusion, bilateral    S/P aortic dissection repair 03/24/2017   Biological Bentall aortic root replacement + resection and grafting of entire ascending aorta, transverse aortic arch and proximal descending thoracic aorta with elephant trunk distal anastomosis and debranching of aortic arch vessels   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 Ease bovine pericardial  tissue valve and 28 mm Gelweave Valsalva aortic root graft with reimplantation of left main and right coronary arteries   Varicose veins     Past Surgical History:  Procedure Laterality Date   APPENDECTOMY     CARDIAC CATHETERIZATION      X3;Dr Caryl Comes, last 01/20/12: mild non-obstructive CAD, normal LV systolic function   CARDIOVERSION N/A 05/31/2018   Procedure: CARDIOVERSION;  Surgeon: Sueanne Margarita, MD;  Location: [facility] ENDOSCOPY;  Service: Cardiovascular;  Laterality: N/A;   CARDIOVERSION N/A 06/13/2020   Procedure: CARDIOVERSION;  Surgeon: Larey Dresser, MD;  Location: [facility] ENDOSCOPY;  Service: Cardiovascular;  Laterality: N/A;   COLONOSCOPY     COLONOSCOPY N/A 01/27/2021   Procedure: COLONOSCOPY;  Surgeon: Gatha Mayer, MD;  Location: [facility] ENDOSCOPY;  Service: Endoscopy;  Laterality: N/A;   CYSTOSCOPY  1978   Dr Hartley Barefoot   ESOPHAGOGASTRODUODENOSCOPY N/A 01/27/2021   Procedure: ESOPHAGOGASTRODUODENOSCOPY (EGD);  Surgeon: Gatha Mayer, MD;  Location: [facility] ENDOSCOPY;  Service: Endoscopy;  Laterality: N/A;   EYE SURGERY     GIVENS CAPSULE STUDY N/A 01/27/2021   Procedure: GIVENS CAPSULE STUDY;  Surgeon: Gatha Mayer, MD;  Location: [place];  Service: Endoscopy;  Laterality: N/A;   IR THORACENTESIS ASP PLEURAL SPACE W/IMG GUIDE  11/25/2017   KNEE ARTHROSCOPY  2012   Dr Theda Sers   LUMBAR LAMINECTOMY  1987   Dr Lestine Box  2007   PERICARDIOCENTESIS N/A 04/07/2017   Procedure: PERICARDIOCENTESIS;  Surgeon: Sherren Mocha, MD;  Location: [place] CV LAB;  Service: Cardiovascular;  Laterality: N/A;   POLYPECTOMY     POLYPECTOMY  01/27/2021   Procedure: POLYPECTOMY;  Surgeon: Gatha Mayer, MD;  Location: [facility] ENDOSCOPY;  Service: Endoscopy;;   PRESSURE SENSOR/CARDIOMEMS N/A 12/26/2020   Procedure: PRESSURE SENSOR/CARDIOMEMS;  Surgeon: Larey Dresser, MD;  Location: [place] CV LAB;  Service: Cardiovascular;  Laterality: N/A;   REPAIR OF ACUTE ASCENDING THORACIC  AORTIC DISSECTION N/A 03/23/2017   Procedure: REPAIR OF ACUTE ASCENDING THORACIC AORTIC DISSECTION.  Bentall procedure.  Aortic root repleacement with bioprosthetic valve.  Reimplantation of left and right coronary arteries.  Total resection of transverse aortic arch.  Elephant trunk distal anastomosis and debranching of arch vessels.;  Surgeon: Rexene Alberts, MD;  Location: [place];  Service: Vascular;  Laterality: N/A;   RIGHT HEART CATH N/A 06/15/2019   Procedure: RIGHT HEART CATH;  Surgeon: Larey Dresser, MD;  Location: [place] CV LAB;  Service: Cardiovascular;  Laterality: N/A;   ROTATOR CUFF REPAIR     SHOULDER ARTHROSCOPY  2011   Dr Theda Sers   SHOULDER ARTHROSCOPY Right 08/2015   SPINAL FUSION  1986   Dr Rolin Barry   TEE WITHOUT CARDIOVERSION N/A 03/23/2017   Procedure: TRANSESOPHAGEAL ECHOCARDIOGRAM (TEE);  Surgeon: Rexene Alberts, MD;  Location: [place];  Service: Open Heart Surgery;  Laterality: N/A;   THORACIC AORTIC ENDOVASCULAR STENT GRAFT N/A 03/30/2018   Procedure: THORACIC AORTIC ENDOVASCULAR STENT GRAFT WITH INTRAVASCULAR ULTRASOUND;  Surgeon: Serafina Mitchell, MD;  Location: [facility] OR;  Service: Vascular;  Laterality: N/A;   TRACHEOSTOMY     age 1 for croup    Allergies as of 02/13/2021       Reactions   Simvastatin Other (See Comments)   Mental status changes   Celecoxib Other (See Comments)   GI UPSET AND INFLAMMATION   Codeine Other (See Comments)   GI UPSET AND INFLAMMATION   Nsaids Other (See Comments)   GI UPSET AND INFLAMMATION (can tolerate via IV)   Tape Other (See Comments)   Medical tape and Band-Aids PULL OFF THE SKIN; please use Coban wrap   Cephalexin Itching, Rash   Latex Rash        Medication List        Accurate as of February 13, 2021 11:59 PM. If you have any questions, ask your nurse or doctor.          STOP taking these medications    predniSONE 10 MG tablet Commonly known as: DELTASONE Stopped by: Kathlene November, MD       TAKE these  medications    albuterol 108 (90 Base) MCG/ACT inhaler Commonly known as: ProAir HFA Inhale 2 puffs into the lungs every 4 (four) hours as needed for wheezing or shortness of breath.   ALPRAZolam 1 MG tablet Commonly known as: XANAX Take 1 tablet (1 mg total) by mouth at bedtime.   augmented betamethasone dipropionate 0.05 % cream Commonly known as: DIPROLENE-AF Apply topically 2 (two) times daily as needed.   bisacodyl 5 MG EC tablet Commonly known as: DULCOLAX Take 5 mg by mouth daily as needed for moderate constipation.   diphenhydrAMINE 25 MG tablet Commonly known as: BENADRYL Take 25 mg by mouth daily as needed (allergies/runny nose).   Dulera 200-5 MCG/ACT Aero Generic drug: mometasone-formoterol Inhale 2 puffs into the lungs in the morning and at bedtime.   Eliquis 5 MG Tabs tablet Generic drug: apixaban Take  1 tablet (5 mg total) by mouth 2 (two) times daily for 1 day.   empagliflozin 10 MG Tabs tablet Commonly known as: Jardiance Take 1 tablet (10 mg total) by mouth daily before breakfast.   escitalopram 10 MG tablet Commonly known as: LEXAPRO TAKE 1 TABLET DAILY   esomeprazole 40 MG capsule Commonly known as: NexIUM Take 1 capsule (40 mg total) by mouth daily before breakfast.   flecainide 50 MG tablet Commonly known as: TAMBOCOR TAKE ONE AND ONE-HALF TABLETS TWICE A DAY   fluticasone 50 MCG/ACT nasal spray Commonly known as: FLONASE Place 1 spray into both nostrils 2 (two) times daily as needed for allergies. Use one spray in each nostril twice daily.   guaiFENesin 600 MG 12 hr tablet Commonly known as: Mucinex Take 1 tablet (600 mg total) by mouth 2 (two) times daily.   hydroxypropyl methylcellulose / hypromellose 2.5 % ophthalmic solution Commonly known as: ISOPTO TEARS / GONIOVISC Place 1-2 drops into both eyes 3 (three) times daily as needed (dry/irritated eyes).   ipratropium-albuterol 0.5-2.5 (3) MG/3ML Soln Commonly known as: DUONEB Take 3  mLs by nebulization 4 (four) times daily.   Klor-Con M20 20 MEQ tablet Generic drug: potassium chloride SA TAKE 2 TABLETS (40 MEQ) DAILY   losartan 25 MG tablet Commonly known as: COZAAR Take 1 tablet (25 mg total) by mouth daily.   magnesium hydroxide 400 MG/5ML suspension Commonly known as: MILK OF MAGNESIA Take 7.5 mLs by mouth daily as needed (stomach cramps/constipation).   metoprolol tartrate 25 MG tablet Commonly known as: LOPRESSOR Take 0.5 tablets (12.5 mg total) by mouth 2 (two) times daily. Please make yearly appt with Dr. Caryl Comes for January 2022 for future refills. Thank you 1st attempt   OXYGEN Inhale 3-4 L/min into the lungs continuous.   rosuvastatin 5 MG tablet Commonly known as: CRESTOR Take 1 tablet (5 mg total) by mouth daily.   torsemide 20 MG tablet Commonly known as: DEMADEX Take 60mg  po qam and 40mg  po qpm           Objective:   Physical Exam BP 112/70 (BP Location: Left Arm, Patient Position: Sitting, Cuff Size: Small)   Pulse 80   Temp 97.7 F (36.5 C) (Oral)   Resp 18   Ht 6' (1.829 m)   Wt 188 lb 6 oz (85.4 kg)   SpO2 95%   BMI 25.55 kg/m  General:   Well developed, NAD, BMI noted.  HEENT:  Normocephalic . Face symmetric, atraumatic Lungs:  CTA B Normal respiratory effort, no intercostal retractions, no accessory muscle use. Heart: + murmur Abdomen:  Not distended, soft, non-tender. No rebound or rigidity.   Skin: Not pale. Not jaundice Lower extremities: no pretibial edema bilaterally  Neurologic:  alert & oriented X3.  Speech normal Psych--  Cognition and judgment appear intact.  Cooperative with normal attention span and concentration.  Behavior appropriate. No anxious or depressed appearing.     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 Loanne Drilling  >> Bx (-) 10-2014 ; took tamoxifen temporarily (self d/c 06-2016) ---Again 09/2019, labs/MMG done, on [place] agai Dermatology : eczema? rx taclonex H/o  Tracheostomy , age 11, croup Chronic anemia:  Chronic anemia per chart review,+ iron deficient (08-2018). S/p  colonoscopy 05-2016, multiple polyps. EGD 2014 for chest pain: Negative, bx results? Admitted 01/2021: Hemoglobin of 5.  Work-up negative.  PLAN: TCM 14 Severe anemia: Admitted to hospital with symptomatic severe anemia, Hemoccult +, at the time he was anticoagulated. Received IV fluids, 4 PRBCs, Eliquis held temporarily. EGD, colonoscopy and capsule endoscopy unrevealing. He is back on Eliquis, doing well, getting stronger, no GI symptoms. Plan: Check CBC, iron, ferritin. Atrial fibrillation: See above, back on Eliquis HTN: BP today is very good, recommend to check ambulatory BPs, continue with potassium, losartan, metoprolol.  Demadex. COPD, home O2: Currently off prednisone, on ProAir, duo nebs, Dulera.  Reports Ruthe Mannan is really helping.  O2 sats at home in the 90s but decreased with exertion. Insomnia: On Xanax, PDMP reviewed, RF sent. RTC 3 months  This visit occurred during the SARS-CoV-2 public health emergency.  Safety protocols were in place, including screening questions prior to the visit, additional usage of staff PPE, and extensive cleaning of exam room while observing appropriate contact time as indicated for disinfecting solutions.

## 2021-02-14 DIAGNOSIS — I5033 Acute on chronic diastolic (congestive) heart failure: Secondary | ICD-10-CM | POA: Diagnosis not present

## 2021-02-14 DIAGNOSIS — I25110 Atherosclerotic heart disease of native coronary artery with unstable angina pectoris: Secondary | ICD-10-CM | POA: Diagnosis not present

## 2021-02-14 DIAGNOSIS — N183 Chronic kidney disease, stage 3 unspecified: Secondary | ICD-10-CM | POA: Diagnosis not present

## 2021-02-14 DIAGNOSIS — J9621 Acute and chronic respiratory failure with hypoxia: Secondary | ICD-10-CM | POA: Diagnosis not present

## 2021-02-14 DIAGNOSIS — J449 Chronic obstructive pulmonary disease, unspecified: Secondary | ICD-10-CM | POA: Diagnosis not present

## 2021-02-14 DIAGNOSIS — I130 Hypertensive heart and chronic kidney disease with heart failure and stage 1 through stage 4 chronic kidney disease, or unspecified chronic kidney disease: Secondary | ICD-10-CM | POA: Diagnosis not present

## 2021-02-14 NOTE — Assessment & Plan Note (Signed)
TCM 14 Severe anemia: Admitted to hospital with symptomatic severe anemia, Hemoccult +, at the time he was anticoagulated. Received IV fluids, 4 PRBCs, Eliquis held temporarily. EGD, colonoscopy and capsule endoscopy unrevealing. He is back on Eliquis, doing well, getting stronger, no GI symptoms. Plan: Check CBC, iron, ferritin. Atrial fibrillation: See above, back on Eliquis HTN: BP today is very good, recommend to check ambulatory BPs, continue with potassium, losartan, metoprolol.  Demadex. COPD, home O2: Currently off prednisone, on ProAir, duo nebs, Dulera.  Reports Ruthe Mannan is really helping.  O2 sats at home in the 90s but decreased with exertion. Insomnia: On Xanax, PDMP reviewed, RF sent. RTC 3 months

## 2021-02-19 ENCOUNTER — Telehealth: Payer: Self-pay

## 2021-02-19 DIAGNOSIS — N183 Chronic kidney disease, stage 3 unspecified: Secondary | ICD-10-CM | POA: Diagnosis not present

## 2021-02-19 DIAGNOSIS — I5033 Acute on chronic diastolic (congestive) heart failure: Secondary | ICD-10-CM | POA: Diagnosis not present

## 2021-02-19 DIAGNOSIS — J9621 Acute and chronic respiratory failure with hypoxia: Secondary | ICD-10-CM | POA: Diagnosis not present

## 2021-02-19 DIAGNOSIS — I130 Hypertensive heart and chronic kidney disease with heart failure and stage 1 through stage 4 chronic kidney disease, or unspecified chronic kidney disease: Secondary | ICD-10-CM | POA: Diagnosis not present

## 2021-02-19 DIAGNOSIS — D649 Anemia, unspecified: Secondary | ICD-10-CM

## 2021-02-19 DIAGNOSIS — I25110 Atherosclerotic heart disease of native coronary artery with unstable angina pectoris: Secondary | ICD-10-CM | POA: Diagnosis not present

## 2021-02-19 DIAGNOSIS — J449 Chronic obstructive pulmonary disease, unspecified: Secondary | ICD-10-CM | POA: Diagnosis not present

## 2021-02-19 NOTE — Telephone Encounter (Signed)
Peter, PT, with [facility], called stating he is with pt and pt's BP is 95/45 w/ no symptoms.  Peter's CB # is [phone].

## 2021-02-19 NOTE — Telephone Encounter (Signed)
Advise patient: Recommend to check BPs daily, call if less than 110/60 or more than 135/85. Also has a history of recent anemia.  Last CBC okay but we will recheck a CBC next week, DX anemia, please arrange

## 2021-02-19 NOTE — Telephone Encounter (Signed)
LMOM for Sean Riley and Pt informing of recommendations. Instructed Sean Riley to call office to schedule lab appt in 1 week. CBC ordered.

## 2021-02-20 ENCOUNTER — Telehealth: Payer: Self-pay

## 2021-02-20 DIAGNOSIS — I4510 Unspecified right bundle-branch block: Secondary | ICD-10-CM | POA: Diagnosis not present

## 2021-02-20 DIAGNOSIS — J449 Chronic obstructive pulmonary disease, unspecified: Secondary | ICD-10-CM | POA: Diagnosis not present

## 2021-02-20 DIAGNOSIS — I130 Hypertensive heart and chronic kidney disease with heart failure and stage 1 through stage 4 chronic kidney disease, or unspecified chronic kidney disease: Secondary | ICD-10-CM | POA: Diagnosis not present

## 2021-02-20 DIAGNOSIS — J9621 Acute and chronic respiratory failure with hypoxia: Secondary | ICD-10-CM | POA: Diagnosis not present

## 2021-02-20 DIAGNOSIS — I5033 Acute on chronic diastolic (congestive) heart failure: Secondary | ICD-10-CM | POA: Diagnosis not present

## 2021-02-20 DIAGNOSIS — F32A Depression, unspecified: Secondary | ICD-10-CM

## 2021-02-20 DIAGNOSIS — I8390 Asymptomatic varicose veins of unspecified lower extremity: Secondary | ICD-10-CM

## 2021-02-20 DIAGNOSIS — N400 Enlarged prostate without lower urinary tract symptoms: Secondary | ICD-10-CM

## 2021-02-20 DIAGNOSIS — R7301 Impaired fasting glucose: Secondary | ICD-10-CM | POA: Diagnosis not present

## 2021-02-20 DIAGNOSIS — G4733 Obstructive sleep apnea (adult) (pediatric): Secondary | ICD-10-CM

## 2021-02-20 DIAGNOSIS — D62 Acute posthemorrhagic anemia: Secondary | ICD-10-CM | POA: Diagnosis not present

## 2021-02-20 DIAGNOSIS — I25110 Atherosclerotic heart disease of native coronary artery with unstable angina pectoris: Secondary | ICD-10-CM | POA: Diagnosis not present

## 2021-02-20 DIAGNOSIS — I4819 Other persistent atrial fibrillation: Secondary | ICD-10-CM | POA: Diagnosis not present

## 2021-02-20 DIAGNOSIS — E785 Hyperlipidemia, unspecified: Secondary | ICD-10-CM | POA: Diagnosis not present

## 2021-02-20 DIAGNOSIS — K648 Other hemorrhoids: Secondary | ICD-10-CM

## 2021-02-20 DIAGNOSIS — Z952 Presence of prosthetic heart valve: Secondary | ICD-10-CM

## 2021-02-20 DIAGNOSIS — N183 Chronic kidney disease, stage 3 unspecified: Secondary | ICD-10-CM | POA: Diagnosis not present

## 2021-02-20 DIAGNOSIS — M1990 Unspecified osteoarthritis, unspecified site: Secondary | ICD-10-CM | POA: Diagnosis not present

## 2021-02-20 DIAGNOSIS — K219 Gastro-esophageal reflux disease without esophagitis: Secondary | ICD-10-CM

## 2021-02-20 DIAGNOSIS — K5730 Diverticulosis of large intestine without perforation or abscess without bleeding: Secondary | ICD-10-CM

## 2021-02-20 DIAGNOSIS — F419 Anxiety disorder, unspecified: Secondary | ICD-10-CM

## 2021-02-20 NOTE — Telephone Encounter (Signed)
Plan of care signed and faxed back to [place] at [phone]. Form sent for scanning.

## 2021-02-21 ENCOUNTER — Telehealth: Payer: Self-pay

## 2021-02-21 ENCOUNTER — Telehealth: Payer: Self-pay | Admitting: Internal Medicine

## 2021-02-21 ENCOUNTER — Other Ambulatory Visit: Payer: Self-pay

## 2021-02-21 DIAGNOSIS — I25110 Atherosclerotic heart disease of native coronary artery with unstable angina pectoris: Secondary | ICD-10-CM | POA: Diagnosis not present

## 2021-02-21 DIAGNOSIS — J449 Chronic obstructive pulmonary disease, unspecified: Secondary | ICD-10-CM | POA: Diagnosis not present

## 2021-02-21 DIAGNOSIS — I130 Hypertensive heart and chronic kidney disease with heart failure and stage 1 through stage 4 chronic kidney disease, or unspecified chronic kidney disease: Secondary | ICD-10-CM | POA: Diagnosis not present

## 2021-02-21 DIAGNOSIS — I5033 Acute on chronic diastolic (congestive) heart failure: Secondary | ICD-10-CM | POA: Diagnosis not present

## 2021-02-21 DIAGNOSIS — J9621 Acute and chronic respiratory failure with hypoxia: Secondary | ICD-10-CM | POA: Diagnosis not present

## 2021-02-21 DIAGNOSIS — N183 Chronic kidney disease, stage 3 unspecified: Secondary | ICD-10-CM | POA: Diagnosis not present

## 2021-02-21 NOTE — Telephone Encounter (Signed)
Recommend to decrease Demadex 20 mg to: 2 in the morning and 1 in the afternoon.  Continue monitoring BPs to be sure they are coming up to a more normal level. Monitor for edema or weight gain.  See my previous note, if there is any evidence of bleeding (abdominal pain, nausea, vomiting, change in the color of the stools, severe weakness or dizziness): ER.

## 2021-02-21 NOTE — Telephone Encounter (Signed)
Peter from [facility] called wondering if patient still needs to take BP medications..  BP readings: 02/21/2021- 102/40 02/19/2021- 95/45   No other readings were recorded.   Call back number: [phone]

## 2021-02-21 NOTE — Telephone Encounter (Signed)
Spoke w/ Collier Salina informed of recommendations. He verbalized understanding. I called and spoke w/ [name]- informed of recommendations. Med list updated.

## 2021-02-24 ENCOUNTER — Encounter (HOSPITAL_COMMUNITY): Payer: Medicare Other

## 2021-02-24 DIAGNOSIS — J9621 Acute and chronic respiratory failure with hypoxia: Secondary | ICD-10-CM | POA: Diagnosis not present

## 2021-02-24 DIAGNOSIS — I25110 Atherosclerotic heart disease of native coronary artery with unstable angina pectoris: Secondary | ICD-10-CM | POA: Diagnosis not present

## 2021-02-24 DIAGNOSIS — N183 Chronic kidney disease, stage 3 unspecified: Secondary | ICD-10-CM | POA: Diagnosis not present

## 2021-02-24 DIAGNOSIS — I5033 Acute on chronic diastolic (congestive) heart failure: Secondary | ICD-10-CM | POA: Diagnosis not present

## 2021-02-24 DIAGNOSIS — J449 Chronic obstructive pulmonary disease, unspecified: Secondary | ICD-10-CM | POA: Diagnosis not present

## 2021-02-24 DIAGNOSIS — I130 Hypertensive heart and chronic kidney disease with heart failure and stage 1 through stage 4 chronic kidney disease, or unspecified chronic kidney disease: Secondary | ICD-10-CM | POA: Diagnosis not present

## 2021-02-26 ENCOUNTER — Encounter (HOSPITAL_COMMUNITY): Payer: Medicare Other

## 2021-02-28 DIAGNOSIS — I25110 Atherosclerotic heart disease of native coronary artery with unstable angina pectoris: Secondary | ICD-10-CM | POA: Diagnosis not present

## 2021-02-28 DIAGNOSIS — J9621 Acute and chronic respiratory failure with hypoxia: Secondary | ICD-10-CM | POA: Diagnosis not present

## 2021-02-28 DIAGNOSIS — J449 Chronic obstructive pulmonary disease, unspecified: Secondary | ICD-10-CM | POA: Diagnosis not present

## 2021-02-28 DIAGNOSIS — N183 Chronic kidney disease, stage 3 unspecified: Secondary | ICD-10-CM | POA: Diagnosis not present

## 2021-02-28 DIAGNOSIS — I5033 Acute on chronic diastolic (congestive) heart failure: Secondary | ICD-10-CM | POA: Diagnosis not present

## 2021-02-28 DIAGNOSIS — I130 Hypertensive heart and chronic kidney disease with heart failure and stage 1 through stage 4 chronic kidney disease, or unspecified chronic kidney disease: Secondary | ICD-10-CM | POA: Diagnosis not present

## 2021-03-03 DIAGNOSIS — J9621 Acute and chronic respiratory failure with hypoxia: Secondary | ICD-10-CM | POA: Diagnosis not present

## 2021-03-03 DIAGNOSIS — N183 Chronic kidney disease, stage 3 unspecified: Secondary | ICD-10-CM | POA: Diagnosis not present

## 2021-03-03 DIAGNOSIS — Z952 Presence of prosthetic heart valve: Secondary | ICD-10-CM | POA: Diagnosis not present

## 2021-03-03 DIAGNOSIS — R7301 Impaired fasting glucose: Secondary | ICD-10-CM | POA: Diagnosis not present

## 2021-03-03 DIAGNOSIS — I25110 Atherosclerotic heart disease of native coronary artery with unstable angina pectoris: Secondary | ICD-10-CM | POA: Diagnosis not present

## 2021-03-03 DIAGNOSIS — K5730 Diverticulosis of large intestine without perforation or abscess without bleeding: Secondary | ICD-10-CM | POA: Diagnosis not present

## 2021-03-03 DIAGNOSIS — K219 Gastro-esophageal reflux disease without esophagitis: Secondary | ICD-10-CM | POA: Diagnosis not present

## 2021-03-03 DIAGNOSIS — E785 Hyperlipidemia, unspecified: Secondary | ICD-10-CM | POA: Diagnosis not present

## 2021-03-03 DIAGNOSIS — N400 Enlarged prostate without lower urinary tract symptoms: Secondary | ICD-10-CM | POA: Diagnosis not present

## 2021-03-03 DIAGNOSIS — F419 Anxiety disorder, unspecified: Secondary | ICD-10-CM | POA: Diagnosis not present

## 2021-03-03 DIAGNOSIS — K648 Other hemorrhoids: Secondary | ICD-10-CM | POA: Diagnosis not present

## 2021-03-03 DIAGNOSIS — I4819 Other persistent atrial fibrillation: Secondary | ICD-10-CM | POA: Diagnosis not present

## 2021-03-03 DIAGNOSIS — Z87440 Personal history of urinary (tract) infections: Secondary | ICD-10-CM | POA: Diagnosis not present

## 2021-03-03 DIAGNOSIS — F32A Depression, unspecified: Secondary | ICD-10-CM | POA: Diagnosis not present

## 2021-03-03 DIAGNOSIS — Z86010 Personal history of colonic polyps: Secondary | ICD-10-CM | POA: Diagnosis not present

## 2021-03-03 DIAGNOSIS — I8390 Asymptomatic varicose veins of unspecified lower extremity: Secondary | ICD-10-CM | POA: Diagnosis not present

## 2021-03-03 DIAGNOSIS — J449 Chronic obstructive pulmonary disease, unspecified: Secondary | ICD-10-CM | POA: Diagnosis not present

## 2021-03-03 DIAGNOSIS — I4510 Unspecified right bundle-branch block: Secondary | ICD-10-CM | POA: Diagnosis not present

## 2021-03-03 DIAGNOSIS — G4733 Obstructive sleep apnea (adult) (pediatric): Secondary | ICD-10-CM | POA: Diagnosis not present

## 2021-03-03 DIAGNOSIS — M1990 Unspecified osteoarthritis, unspecified site: Secondary | ICD-10-CM | POA: Diagnosis not present

## 2021-03-03 DIAGNOSIS — Z981 Arthrodesis status: Secondary | ICD-10-CM | POA: Diagnosis not present

## 2021-03-03 DIAGNOSIS — D62 Acute posthemorrhagic anemia: Secondary | ICD-10-CM | POA: Diagnosis not present

## 2021-03-03 DIAGNOSIS — I5033 Acute on chronic diastolic (congestive) heart failure: Secondary | ICD-10-CM | POA: Diagnosis not present

## 2021-03-03 DIAGNOSIS — Z9049 Acquired absence of other specified parts of digestive tract: Secondary | ICD-10-CM | POA: Diagnosis not present

## 2021-03-03 DIAGNOSIS — I130 Hypertensive heart and chronic kidney disease with heart failure and stage 1 through stage 4 chronic kidney disease, or unspecified chronic kidney disease: Secondary | ICD-10-CM | POA: Diagnosis not present

## 2021-03-04 DIAGNOSIS — I25110 Atherosclerotic heart disease of native coronary artery with unstable angina pectoris: Secondary | ICD-10-CM | POA: Diagnosis not present

## 2021-03-04 DIAGNOSIS — I130 Hypertensive heart and chronic kidney disease with heart failure and stage 1 through stage 4 chronic kidney disease, or unspecified chronic kidney disease: Secondary | ICD-10-CM | POA: Diagnosis not present

## 2021-03-04 DIAGNOSIS — I5033 Acute on chronic diastolic (congestive) heart failure: Secondary | ICD-10-CM | POA: Diagnosis not present

## 2021-03-04 DIAGNOSIS — J9621 Acute and chronic respiratory failure with hypoxia: Secondary | ICD-10-CM | POA: Diagnosis not present

## 2021-03-04 DIAGNOSIS — N183 Chronic kidney disease, stage 3 unspecified: Secondary | ICD-10-CM | POA: Diagnosis not present

## 2021-03-04 DIAGNOSIS — J449 Chronic obstructive pulmonary disease, unspecified: Secondary | ICD-10-CM | POA: Diagnosis not present

## 2021-03-07 DIAGNOSIS — I5033 Acute on chronic diastolic (congestive) heart failure: Secondary | ICD-10-CM | POA: Diagnosis not present

## 2021-03-07 DIAGNOSIS — I130 Hypertensive heart and chronic kidney disease with heart failure and stage 1 through stage 4 chronic kidney disease, or unspecified chronic kidney disease: Secondary | ICD-10-CM | POA: Diagnosis not present

## 2021-03-07 DIAGNOSIS — J9621 Acute and chronic respiratory failure with hypoxia: Secondary | ICD-10-CM | POA: Diagnosis not present

## 2021-03-07 DIAGNOSIS — I25110 Atherosclerotic heart disease of native coronary artery with unstable angina pectoris: Secondary | ICD-10-CM | POA: Diagnosis not present

## 2021-03-07 DIAGNOSIS — N183 Chronic kidney disease, stage 3 unspecified: Secondary | ICD-10-CM | POA: Diagnosis not present

## 2021-03-07 DIAGNOSIS — J449 Chronic obstructive pulmonary disease, unspecified: Secondary | ICD-10-CM | POA: Diagnosis not present

## 2021-03-10 DIAGNOSIS — I5033 Acute on chronic diastolic (congestive) heart failure: Secondary | ICD-10-CM | POA: Diagnosis not present

## 2021-03-10 DIAGNOSIS — I130 Hypertensive heart and chronic kidney disease with heart failure and stage 1 through stage 4 chronic kidney disease, or unspecified chronic kidney disease: Secondary | ICD-10-CM | POA: Diagnosis not present

## 2021-03-10 DIAGNOSIS — J9621 Acute and chronic respiratory failure with hypoxia: Secondary | ICD-10-CM | POA: Diagnosis not present

## 2021-03-10 DIAGNOSIS — J449 Chronic obstructive pulmonary disease, unspecified: Secondary | ICD-10-CM | POA: Diagnosis not present

## 2021-03-10 DIAGNOSIS — I25110 Atherosclerotic heart disease of native coronary artery with unstable angina pectoris: Secondary | ICD-10-CM | POA: Diagnosis not present

## 2021-03-10 DIAGNOSIS — N183 Chronic kidney disease, stage 3 unspecified: Secondary | ICD-10-CM | POA: Diagnosis not present

## 2021-03-12 ENCOUNTER — Encounter: Payer: Self-pay | Admitting: Pulmonary Disease

## 2021-03-12 ENCOUNTER — Other Ambulatory Visit: Payer: Self-pay

## 2021-03-12 ENCOUNTER — Ambulatory Visit (INDEPENDENT_AMBULATORY_CARE_PROVIDER_SITE_OTHER): Payer: Medicare Other | Admitting: Pulmonary Disease

## 2021-03-12 VITALS — BP 124/68 | HR 85 | Ht 72.0 in | Wt 196.6 lb

## 2021-03-12 DIAGNOSIS — J449 Chronic obstructive pulmonary disease, unspecified: Secondary | ICD-10-CM

## 2021-03-12 MED ORDER — BREZTRI AEROSPHERE 160-9-4.8 MCG/ACT IN AERO: 2.0000 | INHALATION_SPRAY | Freq: Two times a day (BID) | RESPIRATORY_TRACT | 0 refills | Status: DC

## 2021-03-12 MED ORDER — BREZTRI AEROSPHERE 160-9-4.8 MCG/ACT IN AERO: 2.0000 | INHALATION_SPRAY | Freq: Two times a day (BID) | RESPIRATORY_TRACT | 5 refills | Status: DC

## 2021-03-12 MED ORDER — IPRATROPIUM-ALBUTEROL 0.5-2.5 (3) MG/3ML IN SOLN: 3.0000 mL | RESPIRATORY_TRACT | 11 refills | Status: DC | PRN

## 2021-03-12 NOTE — Progress Notes (Signed)
Sean Riley    KP:[id number]    [birth year]  Primary Care Physician:Paz, Alda Berthold, MD  Referring Physician: Colon Branch, MD [address] [place],  [place] [address]  Chief complaint: Follow-up for COPD, pleural effusions  HPI: 70 year old heavy smoker with history of atrial fibrillation on Eliquis, COPD, ascending aortic dissection status post repair, aortic valve replacement on 03/24/17. Followed with Dr. Roxy Manns.  CT scan in January showed large bilateral effusion.  He was started on Lasix with improvement in effusion  He has followed up with Dr. Ricard Dillon and underwent thoracentesis of the right pleural effusion on 11/25/17.  I do not see any pleural studies sent from the procedure. Underwent endovascular repair of thoracic aortic aneurysm on 03/30/2018 without any respiratory issue  Has been unable to tolerate breo, incruse and trelegy due to hoarseness of voice  Pets: Dog, no birds, [place]. Occupation: Retired Museum/gallery curator. Exposures: No known exposures, no mold, he has a hot tub but does not use it Smoking history: 90-pack-year smoking history.  Quit in 2018 Travel History: Not significant  Interim history: Hospitalized in June 2022 for symptomatic GI bleed with anemia.  Underwent EGD, colonoscopy and capsule study with no bleeding site identified.  He was started on Dulera inhaler but unable to continue due to insurance issues  Continues on supplemental oxygen with chronic dyspnea on exertion  Outpatient Encounter Medications as of 03/12/2021  Medication Sig   albuterol (PROAIR HFA) 108 (90 Base) MCG/ACT inhaler Inhale 2 puffs into the lungs every 4 (four) hours as needed for wheezing or shortness of breath.   ALPRAZolam (XANAX) 1 MG tablet Take 1 tablet (1 mg total) by mouth at bedtime.   apixaban (ELIQUIS) 5 MG TABS tablet Take 1 tablet (5 mg total) by mouth 2 (two) times daily for 1 day.   augmented betamethasone dipropionate  (DIPROLENE-AF) 0.05 % cream Apply topically 2 (two) times daily as needed.   bisacodyl (DULCOLAX) 5 MG EC tablet Take 5 mg by mouth daily as needed for moderate constipation.   diphenhydrAMINE (BENADRYL) 25 MG tablet Take 25 mg by mouth daily as needed (allergies/runny nose).   empagliflozin (JARDIANCE) 10 MG TABS tablet Take 1 tablet (10 mg total) by mouth daily before breakfast.   escitalopram (LEXAPRO) 10 MG tablet TAKE 1 TABLET DAILY   esomeprazole (NEXIUM) 40 MG capsule Take 1 capsule (40 mg total) by mouth daily before breakfast.   flecainide (TAMBOCOR) 50 MG tablet TAKE ONE AND ONE-HALF TABLETS TWICE A DAY   fluticasone (FLONASE) 50 MCG/ACT nasal spray Place 1 spray into both nostrils 2 (two) times daily as needed for allergies. Use one spray in each nostril twice daily.   guaiFENesin (MUCINEX) 600 MG 12 hr tablet Take 1 tablet (600 mg total) by mouth 2 (two) times daily.   hydroxypropyl methylcellulose / hypromellose (ISOPTO TEARS / GONIOVISC) 2.5 % ophthalmic solution Place 1-2 drops into both eyes 3 (three) times daily as needed (dry/irritated eyes).   ipratropium-albuterol (DUONEB) 0.5-2.5 (3) MG/3ML SOLN Take 3 mLs by nebulization 4 (four) times daily.   KLOR-CON M20 20 MEQ tablet TAKE 2 TABLETS (40 MEQ) DAILY   losartan (COZAAR) 25 MG tablet Take 1 tablet (25 mg total) by mouth daily.   magnesium hydroxide (MILK OF MAGNESIA) 400 MG/5ML suspension Take 7.5 mLs by mouth daily as needed (stomach cramps/constipation).   metoprolol tartrate (LOPRESSOR) 25 MG tablet Take 0.5  tablets (12.5 mg total) by mouth 2 (two) times daily. Please make yearly appt with Dr. Caryl Comes for January 2022 for future refills. Thank you 1st attempt   mometasone-formoterol (DULERA) 200-5 MCG/ACT AERO Inhale 2 puffs into the lungs in the morning and at bedtime.   OXYGEN Inhale 3-4 L/min into the lungs continuous.   rosuvastatin (CRESTOR) 5 MG tablet Take 1 tablet (5 mg total) by mouth daily.   torsemide (DEMADEX) 20  MG tablet Take '40mg'$  po qam and '20mg'$  po qpm   No facility-administered encounter medications on file as of 03/12/2021.   Physical Exam: Blood pressure 124/68, pulse 85, height 6' (1.829 m), weight 196 lb 9.6 oz (89.2 kg), SpO2 94 %. Gen:      No acute distress HEENT:  EOMI, sclera anicteric Neck:     No masses; no thyromegaly Lungs:    Clear to auscultation bilaterally; normal respiratory effort CV:         Regular rate and rhythm; no murmurs Abd:      + bowel sounds; soft, non-tender; no palpable masses, no distension Ext:    No edema; adequate peripheral perfusion Skin:      Warm and dry; no rash Neuro: alert and oriented x 3 Psych: normal mood and affect   Data Reviewed: Imaging CT 03/22/17- moderate emphysema, left upper lobe 3.6 mm nodule.  CT angio 08/23/17-emphysema, large-moderate right and left effusion.  Screening CT 10/11/2019-lobular emphysema, calcified granuloma in the right upper lobe.  Small partially loculated bilateral effusions with scarring.  CTA 01/29/2020-stable emphysema, bilateral effusions  Screening CT chest 11/13/2020-small stable pulmonary nodules, atelectasis, small bilateral effusions, emphysema.  CT abdomen pelvis 01/29/2021-increase in size of effusions. I have reviewed the images personally.  PFTs 01/05/17-FVC 4.02 [80%], FEV1 3.03 [81%], F/F 75 12/24/15-FVC 4.27 [91%], FEV1 2.39 [86%], F/F 79 Normal spirometry  12/25/13 FVC 4.96 [103%], FEV1 3.51 [97%], F/F 71, p.o. 314%, DLCO 72% Minimal obstruction, small airway disease, minimal diffusion defect  01/25/2018 FVC 2.92 (62%], FEV1 1.98 [57%], F/F 68, TLC 66%, DLCO 32% Moderate-severe obstruction, severe restriction and diffusion defect.  Labs Blood allergy profile 01/25/2018- IgE 841, sensitive to grass, cat, dog, tree pollen CBC 01/25/2018- WBC 6.7, eos 4.8%, absolute eosinophil count 300 Alpha-1 antitrypsin 01/25/2018- 166, PI MM  Assessment:  COPD PFTs reviewed with moderate-severe  obstruction Has not tolerated multiple inhalers in the past due to hoarseness We will trial him on breztri inhaler with spacer.  Reminded him to rinse and gargle after every use Continue supplemental oxygen DuoNebs as needed  Pleural effusions CT scan reviewed with bilateral effusions that are partially loculated with some scarring These effusions are present since aortic surgery in 2018.  Suspect this is postoperative fluid collections which are chronic now.  Also may have a component of CHF secondary to chronic diastolic heart failure, paroxysmal atrial fibrillation. No evidence of interstitial lung disease. Continue monitoring  OSA Continue CPAP.  Health maintenance Pneumovax 10/16/13, Prevnar 05/10/14 Vaccinated against Covid.  Recommended booster vaccination Continue low-dose screening CTs of chest  Plan/Recommendations: - Start breztri with spacer - Supplemental oxygen  Follow-up in 4 to 6 months  [name] MD [facility] 03/12/2021, 10:45 AM  CC: Colon Branch, MD

## 2021-03-12 NOTE — Addendum Note (Signed)
Addended by: Elton Sin on: 03/12/2021 11:55 AM   Modules accepted: Orders

## 2021-03-12 NOTE — Patient Instructions (Signed)
We will start you on an inhaler called breztri Renew duo nebs to be used as needed for shortness of breath Continue supplemental oxygen  Follow-up in 4 to 6 months

## 2021-03-18 DIAGNOSIS — I25110 Atherosclerotic heart disease of native coronary artery with unstable angina pectoris: Secondary | ICD-10-CM | POA: Diagnosis not present

## 2021-03-18 DIAGNOSIS — J449 Chronic obstructive pulmonary disease, unspecified: Secondary | ICD-10-CM | POA: Diagnosis not present

## 2021-03-18 DIAGNOSIS — N183 Chronic kidney disease, stage 3 unspecified: Secondary | ICD-10-CM | POA: Diagnosis not present

## 2021-03-18 DIAGNOSIS — J9621 Acute and chronic respiratory failure with hypoxia: Secondary | ICD-10-CM | POA: Diagnosis not present

## 2021-03-18 DIAGNOSIS — I130 Hypertensive heart and chronic kidney disease with heart failure and stage 1 through stage 4 chronic kidney disease, or unspecified chronic kidney disease: Secondary | ICD-10-CM | POA: Diagnosis not present

## 2021-03-18 DIAGNOSIS — I5033 Acute on chronic diastolic (congestive) heart failure: Secondary | ICD-10-CM | POA: Diagnosis not present

## 2021-03-28 DIAGNOSIS — I25110 Atherosclerotic heart disease of native coronary artery with unstable angina pectoris: Secondary | ICD-10-CM | POA: Diagnosis not present

## 2021-03-28 DIAGNOSIS — I130 Hypertensive heart and chronic kidney disease with heart failure and stage 1 through stage 4 chronic kidney disease, or unspecified chronic kidney disease: Secondary | ICD-10-CM | POA: Diagnosis not present

## 2021-03-28 DIAGNOSIS — I5033 Acute on chronic diastolic (congestive) heart failure: Secondary | ICD-10-CM | POA: Diagnosis not present

## 2021-03-28 DIAGNOSIS — J9621 Acute and chronic respiratory failure with hypoxia: Secondary | ICD-10-CM | POA: Diagnosis not present

## 2021-03-28 DIAGNOSIS — N183 Chronic kidney disease, stage 3 unspecified: Secondary | ICD-10-CM | POA: Diagnosis not present

## 2021-03-28 DIAGNOSIS — J449 Chronic obstructive pulmonary disease, unspecified: Secondary | ICD-10-CM | POA: Diagnosis not present

## 2021-04-03 DIAGNOSIS — J9611 Chronic respiratory failure with hypoxia: Secondary | ICD-10-CM | POA: Diagnosis not present

## 2021-04-03 DIAGNOSIS — I4819 Other persistent atrial fibrillation: Secondary | ICD-10-CM | POA: Diagnosis not present

## 2021-04-03 DIAGNOSIS — K219 Gastro-esophageal reflux disease without esophagitis: Secondary | ICD-10-CM

## 2021-04-03 DIAGNOSIS — I130 Hypertensive heart and chronic kidney disease with heart failure and stage 1 through stage 4 chronic kidney disease, or unspecified chronic kidney disease: Secondary | ICD-10-CM | POA: Diagnosis not present

## 2021-04-03 DIAGNOSIS — I8390 Asymptomatic varicose veins of unspecified lower extremity: Secondary | ICD-10-CM

## 2021-04-03 DIAGNOSIS — Z952 Presence of prosthetic heart valve: Secondary | ICD-10-CM

## 2021-04-03 DIAGNOSIS — F32A Depression, unspecified: Secondary | ICD-10-CM

## 2021-04-03 DIAGNOSIS — Z9981 Dependence on supplemental oxygen: Secondary | ICD-10-CM

## 2021-04-03 DIAGNOSIS — E785 Hyperlipidemia, unspecified: Secondary | ICD-10-CM | POA: Diagnosis not present

## 2021-04-03 DIAGNOSIS — I2510 Atherosclerotic heart disease of native coronary artery without angina pectoris: Secondary | ICD-10-CM | POA: Diagnosis not present

## 2021-04-03 DIAGNOSIS — Z7901 Long term (current) use of anticoagulants: Secondary | ICD-10-CM

## 2021-04-03 DIAGNOSIS — I4510 Unspecified right bundle-branch block: Secondary | ICD-10-CM | POA: Diagnosis not present

## 2021-04-03 DIAGNOSIS — N1832 Chronic kidney disease, stage 3b: Secondary | ICD-10-CM | POA: Diagnosis not present

## 2021-04-03 DIAGNOSIS — J449 Chronic obstructive pulmonary disease, unspecified: Secondary | ICD-10-CM | POA: Diagnosis not present

## 2021-04-03 DIAGNOSIS — Z7982 Long term (current) use of aspirin: Secondary | ICD-10-CM

## 2021-04-03 DIAGNOSIS — N400 Enlarged prostate without lower urinary tract symptoms: Secondary | ICD-10-CM | POA: Diagnosis not present

## 2021-04-03 DIAGNOSIS — M1990 Unspecified osteoarthritis, unspecified site: Secondary | ICD-10-CM | POA: Diagnosis not present

## 2021-04-03 DIAGNOSIS — G4733 Obstructive sleep apnea (adult) (pediatric): Secondary | ICD-10-CM

## 2021-04-03 DIAGNOSIS — K5730 Diverticulosis of large intestine without perforation or abscess without bleeding: Secondary | ICD-10-CM | POA: Diagnosis not present

## 2021-04-03 DIAGNOSIS — I5032 Chronic diastolic (congestive) heart failure: Secondary | ICD-10-CM | POA: Diagnosis not present

## 2021-04-03 DIAGNOSIS — F419 Anxiety disorder, unspecified: Secondary | ICD-10-CM

## 2021-04-04 ENCOUNTER — Telehealth: Payer: Self-pay | Admitting: Internal Medicine

## 2021-04-04 ENCOUNTER — Telehealth: Payer: Self-pay

## 2021-04-04 NOTE — Telephone Encounter (Signed)
Cecilia from [facility] called to make Dr. Larose Kells aware that the patient canceled his physical therapy appointment because he is at the beach.

## 2021-04-04 NOTE — Telephone Encounter (Signed)
Plan of care of signed and faxed back to [place] at [phone]. Form sent for scanning.

## 2021-04-04 NOTE — Telephone Encounter (Signed)
Noted  

## 2021-04-09 ENCOUNTER — Encounter (HOSPITAL_COMMUNITY): Payer: Self-pay | Admitting: Cardiology

## 2021-04-09 ENCOUNTER — Ambulatory Visit (HOSPITAL_COMMUNITY)
Admission: RE | Admit: 2021-04-09 | Discharge: 2021-04-09 | Disposition: A | Discharge status: Home / Self Care | Payer: Medicare Other | Source: Ambulatory Visit | Attending: Cardiology | Admitting: Cardiology

## 2021-04-09 ENCOUNTER — Other Ambulatory Visit: Payer: Self-pay

## 2021-04-09 VITALS — BP 80/40 | HR 92 | Wt 192.6 lb

## 2021-04-09 DIAGNOSIS — Z79899 Other long term (current) drug therapy: Secondary | ICD-10-CM | POA: Diagnosis not present

## 2021-04-09 DIAGNOSIS — J449 Chronic obstructive pulmonary disease, unspecified: Secondary | ICD-10-CM | POA: Diagnosis not present

## 2021-04-09 DIAGNOSIS — Z7901 Long term (current) use of anticoagulants: Secondary | ICD-10-CM | POA: Insufficient documentation

## 2021-04-09 DIAGNOSIS — Z09 Encounter for follow-up examination after completed treatment for conditions other than malignant neoplasm: Secondary | ICD-10-CM | POA: Insufficient documentation

## 2021-04-09 DIAGNOSIS — R531 Weakness: Secondary | ICD-10-CM | POA: Insufficient documentation

## 2021-04-09 DIAGNOSIS — I110 Hypertensive heart disease with heart failure: Secondary | ICD-10-CM | POA: Diagnosis not present

## 2021-04-09 DIAGNOSIS — Z87891 Personal history of nicotine dependence: Secondary | ICD-10-CM | POA: Diagnosis not present

## 2021-04-09 DIAGNOSIS — I5032 Chronic diastolic (congestive) heart failure: Secondary | ICD-10-CM | POA: Diagnosis not present

## 2021-04-09 DIAGNOSIS — I480 Paroxysmal atrial fibrillation: Secondary | ICD-10-CM | POA: Diagnosis not present

## 2021-04-09 DIAGNOSIS — G4733 Obstructive sleep apnea (adult) (pediatric): Secondary | ICD-10-CM | POA: Diagnosis not present

## 2021-04-09 DIAGNOSIS — I959 Hypotension, unspecified: Secondary | ICD-10-CM | POA: Diagnosis not present

## 2021-04-09 DIAGNOSIS — Z953 Presence of xenogenic heart valve: Secondary | ICD-10-CM | POA: Insufficient documentation

## 2021-04-09 DIAGNOSIS — D649 Anemia, unspecified: Secondary | ICD-10-CM | POA: Diagnosis not present

## 2021-04-09 DIAGNOSIS — R42 Dizziness and giddiness: Secondary | ICD-10-CM | POA: Insufficient documentation

## 2021-04-09 DIAGNOSIS — Z7984 Long term (current) use of oral hypoglycemic drugs: Secondary | ICD-10-CM | POA: Insufficient documentation

## 2021-04-09 LAB — BASIC METABOLIC PANEL
Anion gap: 11 (ref 5–15)
BUN: 21 mg/dL (ref 8–23)
CO2: 26 mmol/L (ref 22–32)
Calcium: 8.9 mg/dL (ref 8.9–10.3)
Chloride: 92 mmol/L — ABNORMAL LOW (ref 98–111)
Creatinine, Ser: 1.68 mg/dL — ABNORMAL HIGH (ref 0.61–1.24)
GFR, Estimated: 43 mL/min — ABNORMAL LOW (ref >60)
Glucose, Bld: 102 mg/dL — ABNORMAL HIGH (ref 70–99)
Potassium: 4 mmol/L (ref 3.5–5.1)
Sodium: 129 mmol/L — ABNORMAL LOW (ref 135–145)

## 2021-04-09 LAB — BRAIN NATRIURETIC PEPTIDE: B Natriuretic Peptide: 147 pg/mL — ABNORMAL HIGH (ref 0.0–100.0)

## 2021-04-09 LAB — CBC
HCT: 32 % — ABNORMAL LOW (ref 39.0–52.0)
Hemoglobin: 10 g/dL — ABNORMAL LOW (ref 13.0–17.0)
MCH: 25.8 pg — ABNORMAL LOW (ref 26.0–34.0)
MCHC: 31.3 g/dL (ref 30.0–36.0)
MCV: 82.7 fL (ref 80.0–100.0)
Platelets: 329 10*3/uL (ref 150–400)
RBC: 3.87 MIL/uL — ABNORMAL LOW (ref 4.22–5.81)
RDW: 15.8 % — ABNORMAL HIGH (ref 11.5–15.5)
WBC: 11.2 10*3/uL — ABNORMAL HIGH (ref 4.0–10.5)
nRBC: 0 % (ref 0.0–0.2)

## 2021-04-09 LAB — IRON AND TIBC
Iron: 19 ug/dL — ABNORMAL LOW (ref 45–182)
Saturation Ratios: 5 % — ABNORMAL LOW (ref 17.9–39.5)
TIBC: 381 ug/dL (ref 250–450)
UIBC: 362 ug/dL

## 2021-04-09 LAB — FERRITIN: Ferritin: 75 ng/mL (ref 24–336)

## 2021-04-09 MED ORDER — POTASSIUM CHLORIDE CRYS ER 20 MEQ PO TBCR: EXTENDED_RELEASE_TABLET | ORAL | 3 refills | Status: DC

## 2021-04-09 MED ORDER — TORSEMIDE 20 MG PO TABS: 80.0000 mg | ORAL_TABLET | Freq: Every day | ORAL | 3 refills | Status: DC

## 2021-04-09 NOTE — Progress Notes (Signed)
PCP: Colon Branch, MD Cardiology: Dr. Caryl Comes HF Cardiology: Dr. Aundra Dubin  70 y.o. with history of COPD, paroxysmal atrial fibrillation, type A aortic dissection, and chronic diastolic CHF was referred by Dr. Caryl Comes for evaluation of CHF.  Prior to 2018, patient reports no significant limitations.  In 8/18, he had a Type A aortic dissection.  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  The dissection extended down to the abdominal aorta.  He was a heavy smoker, quit in 8/18.  He developed paroxysmal atrial fibrillation, had DCCV in 10/19.  He is on Eliquis and flecainide now.  Last echo in 2/20 showed EF 55%, mildly decreased RV systolic function, normally functioning bioprosthetic aortic valve.  PFTs in 6/19 showed moderate-severe obstruction.    Ever since his operation in 2018, Mr Russin has had significant exertional dyspnea and has been quite limited.    He had RHC in 11/20, showing normal filling pressures, preserved cardiac output, and mild primarily pulmonary venous hypertension.    PYP scan was not suggestive of cardiac amyloidosis.   He saw pulmonary, PFTs showed moderate to severe obstruction.  He is using oxygen with exertion now, which he says helps. He is using CPAP.   Echo in 5/21 showed EF 55%, basal inferior akinesis, mild RV dilation with normal function, PASP 46 mmHg, s/p Bentall, s/p bioprosthetic aortic valve with mean gradient 16 mmHg.   11/21 had DCCV to NSR.  Had been in atrial fibrillation for a few weeks.   He was admitted in 1/22 with CHF exacerbation and diuresed.  Echo in 1/22 showed EF 60-65%, moderate RV enlargement with mildly decreased RV systolic function, PASP 44, bioprosthetic AVR with mean gradient 18 mmHg.   Cardiomems implant 5/22.    Patient was admitted in 6/22 with GI bleeding.  EGD, colonoscopy, and capsule endoscopy were all unremarkable.  No source of bleeding identified.   He returns to  clinic today for followup of CHF.  He is doing PT at home.  Still very weak.  BP is low today and he is lightheaded with standing.  No BRBPR/melena.  Breathing is worse, he is using more oxygen than in the past.  He is short of breath walking around the house.  No orthopnea/PND.  He has occasional wheezing.   Cardiomems PADP 20 mmHg (goal 16 mmHg).    ECG (personally reviewed): NSR w/ RBBB + LAFB, frequent PACs  Labs (8/20): LDL 74 Labs (10/20): Na 126, K 4.5, creatinine 1.15 Labs (11/20): K 3.8, creatinine 1.3 Labs (12/20): K 3.9, creatinine 1.5, myeloma panel negative Labs (3/21): K 4.1, creatinine 1.6, TSH normal Labs (5/21): BNP 142, creatinine 2 Labs (9/21): K 4.5, creatinine 1.8 Labs (10/21): K 4, creatinine 1.55, BNP 118 Labs (2/22): K 4.3, creatinine 1.85 Labs (4/22): K 4.2, creatinine 1.70 Labs (5/22): K 4.8, creatinine 1.95  Labs (7/22): K 3.7, creatinine 1.57, hgb 10.6  PMH: 1. COPD: PFTs (6/19) with moderate-severe obstruction.  Prior heavy smoker, quit 2018.  - Uses home oxygen.  2. Atrial fibrillation: Paroxysmal. DCCV in 10/19, now on flecainide.  - DCCV to NSR in 11/21.  3. HTN 4. Chronic diastolic CHF: Echo (A999333) with EF 55%, mild LVH, mild LV dilation, mild RV dilation with mildly decreased systolic function, bioprosthetic aortic valve with mean gradient 15 mmHg.  - RHC (11/20): mean RA 9, PA 41/15, mean PCWP 13, CI 2.81, PVR 2.15 WU.   - PYP scan not suggestive of cardiac amyloidosis.  -  Echo (5/21): EF 55%, basal inferior akinesis, mild RV dilation with normal function, PASP 46 mmHg, s/p Bentall, s/p bioprosthetic aortic valve with mean gradient 16 mmHg.  - Echo (1/22): EF 60-65%, moderate RV enlargement with mildly decreased RV systolic function, PASP 44, bioprosthetic AVR with mean gradient 18 mmHg.  - Cardiomems implant 5/22.  RHC (5/22): mean RA 11, PA 58/16, PCWP mean 18, CI 2.56, PVR 2.95 WU.  5. Type A aortic dissection: 8/18, patient had emergency repair  with Bentall/bioprosthetic aortic valve.  Dissection extends down to abdominal aorta.  Coronaries re-implanted. Complicated by pericardial effusion requiring pericardiocentesis.  6. Chronic hyponatremia.  7. OSA: He is using CPAP.  8. ABIs (7/21): Normal 9. GI bleeding: 6/22, EGD/c-scope/capsule endoscopy all unrevealing.   FH: Uncle with aortic dissection, mother with SCD at age 34.   SH: Lives on [place] farm in [place].  Married, prior heavy smoker but quit in 2018.   ROS: All systems reviewed and negative except as per HPI.   Current Outpatient Medications  Medication Sig Dispense Refill   albuterol (PROAIR HFA) 108 (90 Base) MCG/ACT inhaler Inhale 2 puffs into the lungs every 4 (four) hours as needed for wheezing or shortness of breath. 54 g 3   ALPRAZolam (XANAX) 1 MG tablet Take 1 tablet (1 mg total) by mouth at bedtime. 30 tablet 2   apixaban (ELIQUIS) 5 MG TABS tablet Take 1 tablet (5 mg total) by mouth 2 (two) times daily for 1 day. 180 tablet 2   augmented betamethasone dipropionate (DIPROLENE-AF) 0.05 % cream Apply topically 2 (two) times daily as needed. 50 g 1   bisacodyl (DULCOLAX) 5 MG EC tablet Take 5 mg by mouth daily as needed for moderate constipation.     Budeson-Glycopyrrol-Formoterol (BREZTRI AEROSPHERE) 160-9-4.8 MCG/ACT AERO Inhale 2 puffs into the lungs in the morning and at bedtime. 10.7 g 5   diphenhydrAMINE (BENADRYL) 25 MG tablet Take 25 mg by mouth daily as needed (allergies/runny nose).     empagliflozin (JARDIANCE) 10 MG TABS tablet Take 1 tablet (10 mg total) by mouth daily before breakfast. 90 tablet 0   escitalopram (LEXAPRO) 10 MG tablet TAKE 1 TABLET DAILY 90 tablet 3   esomeprazole (NEXIUM) 40 MG capsule Take 1 capsule (40 mg total) by mouth daily before breakfast. 90 capsule 3   flecainide (TAMBOCOR) 50 MG tablet TAKE ONE AND ONE-HALF TABLETS TWICE A DAY 270 tablet 2   fluticasone (FLONASE) 50 MCG/ACT nasal spray Place 1 spray into both nostrils 2 (two)  times daily as needed for allergies. Use one spray in each nostril twice daily.     hydroxypropyl methylcellulose / hypromellose (ISOPTO TEARS / GONIOVISC) 2.5 % ophthalmic solution Place 1-2 drops into both eyes 3 (three) times daily as needed (dry/irritated eyes).     ipratropium-albuterol (DUONEB) 0.5-2.5 (3) MG/3ML SOLN Take 3 mLs by nebulization every 4 (four) hours as needed. 360 mL 11   magnesium hydroxide (MILK OF MAGNESIA) 400 MG/5ML suspension Take 7.5 mLs by mouth daily as needed (stomach cramps/constipation).     metoprolol tartrate (LOPRESSOR) 25 MG tablet Take 0.5 tablets (12.5 mg total) by mouth 2 (two) times daily. Please make yearly appt with Dr. Caryl Comes for January 2022 for future refills. Thank you 1st attempt 180 tablet 1   OXYGEN Inhale 3-4 L/min into the lungs continuous.     rosuvastatin (CRESTOR) 5 MG tablet Take 1 tablet (5 mg total) by mouth daily. 90 tablet 1   potassium chloride SA (KLOR-CON  M20) 20 MEQ tablet Take 2 tablets (40 mEq total) by mouth every morning AND 1 tablet (20 mEq total) every evening. 270 tablet 3   torsemide (DEMADEX) 20 MG tablet Take 4 tablets (80 mg total) by mouth daily. 360 tablet 3   No current facility-administered medications for this encounter.   BP (!) 80/40   Pulse 92   Wt 87.4 kg (192 lb 9.6 oz)   SpO2 93% Comment: 5 l n/c  BMI 26.12 kg/m  PHYSICAL EXAM: General: NAD Neck: JVP 7-8 cm with HJR, no thyromegaly or thyroid nodule.  Lungs: Mild rhonchi.  CV: Nondisplaced PMI.  Heart regular S1/S2, no S3/S4, no murmur.  No peripheral edema.  No carotid bruit.  Normal pedal pulses.  Abdomen: Soft, nontender, no hepatosplenomegaly, no distention.  Skin: Intact without lesions or rashes.  Neurologic: Alert and oriented x 3.  Psych: Normal affect. Extremities: No clubbing or cyanosis.  HEENT: Normal.   Assessment/Plan: 1. Atrial fibrillation: Paroxysmal.  S/p DCCV in 11/21.  Maintaining NSR on EKG today with frequent PACs. HR controlled      - Continue Eliquis 5 mg bid.  - Continue flecainide + metoprolol.  2. Chronic diastolic CHF: Echo in [id number] with EF 55%, mildly decreased RV systolic function, normally functioning bioprosthetic aortic valve. RHC in 11/20 after increasing torsemide showed near-normal filling pressures.  PYP study was not suggestive of ATTR amyloidosis and myeloma panel was negative.  Last echo in 1/22 showed EF 60-65%, moderate RV enlargement with mildly decreased RV systolic function, PASP 44, bioprosthetic AVR with mean gradient 18 mmHg.  I suspect that COPD also contributes to his exercise intolerance.  Atrial fibrillation worsens his HF, he is in NSR today. NYHA class IIIb symptoms, worse than in the past but confounded by COPD. Mild volume overload by exam and by Cardiomems.  - Increase torsemide to 80 mg daily.  BMET today and in 10 days.    - Increase KCl to 40 qam/20 qpm.  - Continue Jardiance 10 mg daily.  - BP low, stop losartan.  3. COPD: Moderate to severe obstruction on 6/19 PFTs, prior smoker.  As above, I suspect this contributes to his dyspnea.  He is on home oxygen.  - Continue followup with pulmonary.  4. OSA:  Continue CPAP.    5. Aortic dissection: S/p Type A aortic dissection, s/p Bentall with bioprosthetic aortic valve. Echo today showed stable bioprosthetic aortic valve.  Dr. Trula Slade has been following his residual dissected aorta.   - Given family history of aortic disease (uncle with aortic dissection, mother with SCD at age 42 - ?from dissection), his children's aortas need to be screened due to risk for inherited connective tissue disorder.  We have discussed this and he reports they have been screened.  He is not Marfanoid.   6. GI bleeding: 6/22, endoscopic evaluation was unrevealing.  - Check CBC and Fe studies today.  - If Fe deficient, will arrange for Feraheme infusion.   F/u in 6 wks with APP.    [name]  04/09/2021

## 2021-04-09 NOTE — Patient Instructions (Addendum)
EKG done today.  Labs done today. We will contact you only if your labs are abnormal.  STOP taking Losartan  INCREASE Potassium to 49mq(2 tablets) by mouth every morning and '20mg'$  meq(1 tablet) by mouth every evening.  INCREASE Torsemide to '80mg'$  (4 tablets) by mouth daily.   No other medication changes were made. Please continue all current medications as prescribed.  Your physician recommends that you schedule a follow-up appointment in: 10 days for a lab only appointment and in 1 month for an appointment with our [facility] here in our office.   If you have any questions or concerns before your next appointment please send [place] message through [place]or call our office at [phone]    TO LEAVE A MESSAGE FOR THE NURSE SELECT OPTION 2, PLEASE LEAVE A MESSAGE INCLUDING: YOUR NAME DATE OF BIRTH CALL BACK NUMBER REASON FOR CALL**this is important as we prioritize the call backs  YOU WILL RECEIVE A CALL BACK THE SAME DAY AS LONG AS YOU CALL BEFORE 4:00 PM   Do the following things EVERYDAY: Weigh yourself in the morning before breakfast. Write it down and keep it in a log. Take your medicines as prescribed Eat low salt foods--Limit salt (sodium) to 2000 mg per day.  Stay as active as you can everyday Limit all fluids for the day to less than 2 liters   At the [facility] you and your health needs are our priority. As part of our continuing mission to provide you with exceptional heart care, we have created designated Provider Care Teams. These Care Teams include your primary Cardiologist (physician) and Advanced Practice Providers (APPs- Physician Assistants and Nurse Practitioners) who all work together to provide you with the care you need, when you need it.   You may see any of the following providers on your designated Care Team at your next follow up: Dr DGlori BickersDr DHaynes Kerns NP BLyda Jester PUtahLAudry Riles  PharmD   Please be sure to bring in all your medications bottles to every appointment.

## 2021-04-11 ENCOUNTER — Telehealth: Payer: Self-pay | Admitting: Pulmonary Disease

## 2021-04-11 NOTE — Telephone Encounter (Signed)
Primary Pulmonologist: Mannam Last office visit and with whom: 03/12/21 Mannam What do we see them for (pulmonary problems):  COPD with chronic bronchitis and emphysema. Last OV assessment/plan:   Assessment:  COPD PFTs reviewed with moderate-severe obstruction Has not tolerated multiple inhalers in the past due to hoarseness We will trial him on breztri inhaler with spacer.  Reminded him to rinse and gargle after every use Continue supplemental oxygen DuoNebs as needed   Pleural effusions CT scan reviewed with bilateral effusions that are partially loculated with some scarring These effusions are present since aortic surgery in 2018.  Suspect this is postoperative fluid collections which are chronic now.  Also may have a component of CHF secondary to chronic diastolic heart failure, paroxysmal atrial fibrillation. No evidence of interstitial lung disease. Continue monitoring   OSA Continue CPAP.   Health maintenance Pneumovax 10/16/13, Prevnar 05/10/14 Vaccinated against Covid.  Recommended booster vaccination Continue low-dose screening CTs of chest   Plan/Recommendations: - Start breztri with spacer - Supplemental oxygen   Follow-up in 4 to 6 months   [name] Sean Riley [facility] 03/12/2021, 10:45 AM   CC: Sean Riley, Sean Riley          Patient Instructions by [name], Sean Riley at 03/12/2021 10:30 AM  Author: [name], Sean Riley Author Type: Physician Filed: 03/12/2021 11:04 AM  Note Status: Signed Cosign: Cosign Not Required Encounter Date: 03/12/2021  Editor: [name], Sean Riley (Physician)               We will start you on an inhaler called breztri Renew duo nebs to be used as needed for shortness of breath Continue supplemental oxygen  Follow-up in 4 to 6 months       Orthostatic Vitals Recorded in This Encounter   03/12/2021  1038     Patient Position: Sitting  BP Location: Left Arm  Cuff Size: Normal   Instructions  We will start you  on an inhaler called breztri Renew duo nebs to be used as needed for shortness of breath Continue supplemental oxygen  Follow-up in 4 to 6 months      Was appointment offered to patient (explain)?  Scheduled to see Dr. Vaughan Browner on Tuesday, 04/15/21   Reason for call: Increased sob with exertion over the past week.  Patient's wife states that over the last week, his sats have been dropping to 60-71% on 3-3.5L.  It takes 4-5L to get sats up into the 90's.  He saw his cardiologist this week and was told it was not his CHF.  He states that he is sob at rest at times which has increased from his normal.  His cough is what is normal for him, his mucous is clear.  He is using his Albuterol inhaler 1-2 times daily for the past week and his Duo neb 1-2 times per day for the past week with some relief.  He does not get complete relief.  He was put on a new inhaler, Breztri and his wife does not see that this new inhaler is helping.  He has an appointment with Dr. Vaughan Browner on Tuesday, 9/6 and wants to know if there is anything that can be prescribed to help until he can be seen in the office next week.  Beth,  Please advise.  Thank you.  (examples of things to ask: : When did symptoms start? Fever? Cough? Productive? Color to sputum? More sputum than usual? Wheezing? Have you needed increased oxygen? Are you  taking your respiratory medications? What over the counter measures have you tried?)  Allergies  Allergen Reactions   Simvastatin Other (See Comments)    Mental status changes   Celecoxib Other (See Comments)    GI UPSET AND INFLAMMATION   Codeine Other (See Comments)    GI UPSET AND INFLAMMATION   Nsaids Other (See Comments)    GI UPSET AND INFLAMMATION (can tolerate via IV)   Tape Other (See Comments)    Medical tape and Band-Aids PULL OFF THE SKIN; please use Coban wrap   Cephalexin Itching and Rash   Latex Rash    Immunization History  Administered Date(s) Administered   Fluad Quad(high  Dose 65+) 04/27/2019, 05/20/2020   Influenza, High Dose Seasonal PF 06/05/2015, 05/18/2017, 05/18/2018   Influenza,inj,Quad PF,6+ Mos 04/24/2013, 05/10/2014   Influenza-Unspecified 06/10/2016   Moderna Sars-Covid-2 Vaccination 09/15/2019, 10/13/2019, 06/27/2020   Pneumococcal Conjugate-13 05/10/2014   Pneumococcal Polysaccharide-23 10/16/2013   Td 07/09/2006, 02/19/2008   Zoster, Live 08/10/2014

## 2021-04-11 NOTE — Telephone Encounter (Signed)
Spoke with the pt's spouse  The cough is not new  He is having increased SOB and sats as low as 69%on 3-4 lpm o2  He already had cards eval and is not having CP This has been going on for a wk now- will take him to ED now for eval Forwarding to Dr Vaughan Browner to make them aware

## 2021-04-11 NOTE — Telephone Encounter (Signed)
Is the cough new? Does he have any wheezing, chest tightness or chest discomfort? Would he be able to come in for a CXR in the next hour? I can likely send in a prednisone taper but if his O2 sats are consistently <88% on oxygen he will need to go to ED

## 2021-04-15 ENCOUNTER — Encounter: Payer: Self-pay | Admitting: Pulmonary Disease

## 2021-04-15 ENCOUNTER — Ambulatory Visit (INDEPENDENT_AMBULATORY_CARE_PROVIDER_SITE_OTHER): Payer: Medicare Other

## 2021-04-15 ENCOUNTER — Ambulatory Visit (INDEPENDENT_AMBULATORY_CARE_PROVIDER_SITE_OTHER): Payer: Medicare Other | Admitting: Pulmonary Disease

## 2021-04-15 ENCOUNTER — Other Ambulatory Visit: Payer: Self-pay

## 2021-04-15 VITALS — BP 110/80 | HR 67 | Temp 98.2°F | Ht 72.0 in | Wt 190.2 lb

## 2021-04-15 DIAGNOSIS — J449 Chronic obstructive pulmonary disease, unspecified: Secondary | ICD-10-CM

## 2021-04-15 DIAGNOSIS — J90 Pleural effusion, not elsewhere classified: Secondary | ICD-10-CM | POA: Diagnosis not present

## 2021-04-15 DIAGNOSIS — J439 Emphysema, unspecified: Secondary | ICD-10-CM | POA: Diagnosis not present

## 2021-04-15 MED ORDER — PREDNISONE 20 MG PO TABS: 40.0000 mg | ORAL_TABLET | Freq: Every day | ORAL | 0 refills | Status: AC

## 2021-04-15 MED ORDER — AZITHROMYCIN 250 MG PO TABS: ORAL_TABLET | ORAL | 0 refills | Status: DC

## 2021-04-15 NOTE — Progress Notes (Signed)
Sean Riley    KP:[id number]    Sep 26, 1950  Primary Care Physician:Paz, Alda Berthold, MD  Referring Physician: Colon Branch, MD [address] [place],  [place] [address]  Chief complaint: Follow-up for COPD, pleural effusions  HPI: 70 year old heavy smoker with history of atrial fibrillation on Eliquis, COPD, ascending aortic dissection status post repair, aortic valve replacement on 03/24/17. Followed with Dr. Roxy Manns.  CT scan in January showed large bilateral effusion.  He was started on Lasix with improvement in effusion  He has followed up with Dr. Ricard Dillon and underwent thoracentesis of the right pleural effusion on 11/25/17.  I do not see any pleural studies sent from the procedure. Underwent endovascular repair of thoracic aortic aneurysm on 03/30/2018 without any respiratory issue  Hospitalized in June 2022 for symptomatic GI bleed with anemia.  Underwent EGD, colonoscopy and capsule study with no bleeding site identified.    Has been unable to tolerate breo, incruse and trelegy due to hoarseness of voice He was started on Dulera inhaler but unable to continue due to insurance issues.  Started on breztri inhaler  Pets: Dog, no birds, [place]. Occupation: Retired Museum/gallery curator. Exposures: No known exposures, no mold, he has a hot tub but does not use it Smoking history: 90-pack-year smoking history.  Quit in 2018 Travel History: Not significant  Interim history: Continues on breztri and supplemental oxygen with chronic dyspnea on exertion Reports worsening shortness of breath and hypoxia with desats to 70s and 80s in spite of supplemental oxygen use. He was seen by Dr. Aundra Dubin on 04/09/2021 and diuretic dose increased for volume overload.  He still continues to be symptomatic  Outpatient Encounter Medications as of 04/15/2021  Medication Sig   albuterol (PROAIR HFA) 108 (90 Base) MCG/ACT inhaler Inhale 2 puffs into the lungs every 4 (four) hours  as needed for wheezing or shortness of breath.   ALPRAZolam (XANAX) 1 MG tablet Take 1 tablet (1 mg total) by mouth at bedtime.   apixaban (ELIQUIS) 5 MG TABS tablet Take 1 tablet (5 mg total) by mouth 2 (two) times daily for 1 day.   augmented betamethasone dipropionate (DIPROLENE-AF) 0.05 % cream Apply topically 2 (two) times daily as needed.   bisacodyl (DULCOLAX) 5 MG EC tablet Take 5 mg by mouth daily as needed for moderate constipation.   Budeson-Glycopyrrol-Formoterol (BREZTRI AEROSPHERE) 160-9-4.8 MCG/ACT AERO Inhale 2 puffs into the lungs in the morning and at bedtime.   diphenhydrAMINE (BENADRYL) 25 MG tablet Take 25 mg by mouth daily as needed (allergies/runny nose).   empagliflozin (JARDIANCE) 10 MG TABS tablet Take 1 tablet (10 mg total) by mouth daily before breakfast.   escitalopram (LEXAPRO) 10 MG tablet TAKE 1 TABLET DAILY   esomeprazole (NEXIUM) 40 MG capsule Take 1 capsule (40 mg total) by mouth daily before breakfast.   flecainide (TAMBOCOR) 50 MG tablet TAKE ONE AND ONE-HALF TABLETS TWICE A DAY   fluticasone (FLONASE) 50 MCG/ACT nasal spray Place 1 spray into both nostrils 2 (two) times daily as needed for allergies. Use one spray in each nostril twice daily.   hydroxypropyl methylcellulose / hypromellose (ISOPTO TEARS / GONIOVISC) 2.5 % ophthalmic solution Place 1-2 drops into both eyes 3 (three) times daily as needed (dry/irritated eyes).   ipratropium-albuterol (DUONEB) 0.5-2.5 (3) MG/3ML SOLN Take 3 mLs by nebulization every 4 (four) hours as needed.   magnesium hydroxide (MILK OF MAGNESIA) 400 MG/5ML  suspension Take 7.5 mLs by mouth daily as needed (stomach cramps/constipation).   metoprolol tartrate (LOPRESSOR) 25 MG tablet Take 0.5 tablets (12.5 mg total) by mouth 2 (two) times daily. Please make yearly appt with Dr. Caryl Comes for January 2022 for future refills. Thank you 1st attempt   OXYGEN Inhale 3-4 L/min into the lungs continuous.   potassium chloride SA (KLOR-CON M20)  20 MEQ tablet Take 2 tablets (40 mEq total) by mouth every morning AND 1 tablet (20 mEq total) every evening.   rosuvastatin (CRESTOR) 5 MG tablet Take 1 tablet (5 mg total) by mouth daily.   torsemide (DEMADEX) 20 MG tablet Take 4 tablets (80 mg total) by mouth daily.   No facility-administered encounter medications on file as of 04/15/2021.   Physical Exam: Blood pressure 110/80, pulse 67, temperature 98.2 F (36.8 C), temperature source Oral, height 6' (1.829 m), weight 190 lb 3.2 oz (86.3 kg), SpO2 91 %. Gen:      No acute distress HEENT:  EOMI, sclera anicteric Neck:     No masses; no thyromegaly Lungs:    Clear to auscultation bilaterally; normal respiratory effort CV:         Regular rate and rhythm; no murmurs Abd:      + bowel sounds; soft, non-tender; no palpable masses, no distension Ext:    No edema; adequate peripheral perfusion Skin:      Warm and dry; no rash Neuro: alert and oriented x 3 Psych: normal mood and affect   Data Reviewed: Imaging CT 03/22/17- moderate emphysema, left upper lobe 3.6 mm nodule.  CT angio 08/23/17-emphysema, large-moderate right and left effusion.  Screening CT 10/11/2019-lobular emphysema, calcified granuloma in the right upper lobe.  Small partially loculated bilateral effusions with scarring.  CTA 01/29/2020-stable emphysema, bilateral effusions  Screening CT chest 11/13/2020-small stable pulmonary nodules, atelectasis, small bilateral effusions, emphysema.  CT abdomen pelvis 01/29/2021-increase in size of effusions. I have reviewed the images personally.  PFTs 01/05/17-FVC 4.02 [80%], FEV1 3.03 [81%], F/F 75 12/24/15-FVC 4.27 [91%], FEV1 2.39 [86%], F/F 79 Normal spirometry  12/25/13 FVC 4.96 [103%], FEV1 3.51 [97%], F/F 71, p.o. 314%, DLCO 72% Minimal obstruction, small airway disease, minimal diffusion defect  01/25/2018 FVC 2.92 (62%], FEV1 1.98 [57%], F/F 68, TLC 66%, DLCO 32% Moderate-severe obstruction, severe restriction and  diffusion defect.  Labs Blood allergy profile 01/25/2018- IgE 841, sensitive to grass, cat, dog, tree pollen CBC 01/25/2018- WBC 6.7, eos 4.8%, absolute eosinophil count 300 Alpha-1 antitrypsin 01/25/2018- 166, PI MM  Assessment:  COPD PFTs reviewed with moderate-severe obstruction Has not tolerated multiple inhalers in the past due to hoarseness Currently on breztri inhaler with spacer  Suspect dyspnea is multifactorial from COPD, CHF with pleural effusions, atrial fibrillation.  Low suspicion for PE as he is already on anticoagulation Noted to be more hypoxic.  Not sure if he is actually in exacerbation and he is not wheezing We will give him a Z-Pak and prednisone 40 mg a day for 5 days Chest x-ray today Start duo nebs 2-3 times a day  If that is worsening then he has been advised to seek care in the emergency room.  Pleural effusions CT scan reviewed with bilateral effusions that are partially loculated with some scarring These effusions are present since aortic surgery in 2018.  Suspect this is postoperative fluid collections which are chronic now.  Also may have a component of CHF secondary to chronic diastolic heart failure, paroxysmal atrial fibrillation. No evidence of interstitial lung disease.  Continue monitoring Chest x-ray as above  OSA Continue CPAP.  Health maintenance Pneumovax 10/16/13, Prevnar 05/10/14 Vaccinated against Covid.  Recommended booster vaccination Continue low-dose screening CTs of chest  Plan/Recommendations: Continue breztri Start DuoNeb Z-Pak, prednisone Continue increased dose of Lasix Chest x-ray  Follow-up in 1 to 2 months.  [name] MD [facility] 04/15/2021, 12:11 PM  CC: Colon Branch, MD

## 2021-04-15 NOTE — Patient Instructions (Signed)
We will call in a Z-Pak, prednisone 40 mg a day for 5 days Continue breztri inhaler  Start using your duo nebs 2-3 times a day We will get a chest x-ray today Follow-up in 1 to 2 months

## 2021-04-21 ENCOUNTER — Other Ambulatory Visit (HOSPITAL_COMMUNITY): Payer: Self-pay

## 2021-04-21 DIAGNOSIS — D649 Anemia, unspecified: Secondary | ICD-10-CM

## 2021-04-22 ENCOUNTER — Ambulatory Visit (HOSPITAL_COMMUNITY)
Admission: RE | Admit: 2021-04-22 | Discharge: 2021-04-22 | Disposition: A | Discharge status: Home / Self Care | Payer: Medicare Other | Source: Ambulatory Visit | Attending: Internal Medicine | Admitting: Internal Medicine

## 2021-04-22 ENCOUNTER — Other Ambulatory Visit (HOSPITAL_COMMUNITY): Payer: Self-pay | Admitting: Cardiology

## 2021-04-22 ENCOUNTER — Other Ambulatory Visit: Payer: Self-pay

## 2021-04-22 ENCOUNTER — Ambulatory Visit (HOSPITAL_COMMUNITY)
Admission: RE | Admit: 2021-04-22 | Discharge: 2021-04-22 | Disposition: A | Discharge status: Home / Self Care | Payer: Medicare Other | Source: Ambulatory Visit | Attending: Cardiology | Admitting: Cardiology

## 2021-04-22 DIAGNOSIS — I5032 Chronic diastolic (congestive) heart failure: Secondary | ICD-10-CM | POA: Diagnosis not present

## 2021-04-22 DIAGNOSIS — D649 Anemia, unspecified: Secondary | ICD-10-CM | POA: Insufficient documentation

## 2021-04-22 LAB — BASIC METABOLIC PANEL
Anion gap: 10 (ref 5–15)
BUN: 17 mg/dL (ref 8–23)
CO2: 31 mmol/L (ref 22–32)
Calcium: 9 mg/dL (ref 8.9–10.3)
Chloride: 90 mmol/L — ABNORMAL LOW (ref 98–111)
Creatinine, Ser: 1.34 mg/dL — ABNORMAL HIGH (ref 0.61–1.24)
GFR, Estimated: 57 mL/min — ABNORMAL LOW (ref >60)
Glucose, Bld: 81 mg/dL (ref 70–99)
Potassium: 3.7 mmol/L (ref 3.5–5.1)
Sodium: 131 mmol/L — ABNORMAL LOW (ref 135–145)

## 2021-04-22 MED ORDER — SODIUM CHLORIDE 0.9 % IV SOLN
510.0000 mg | Freq: Once | INTRAVENOUS | Status: AC
  Administered 2021-04-22: 510 mg via INTRAVENOUS
  Filled 2021-04-22: qty 17

## 2021-05-09 ENCOUNTER — Encounter (HOSPITAL_COMMUNITY): Payer: Medicare Other

## 2021-05-16 ENCOUNTER — Encounter: Payer: Self-pay | Admitting: Internal Medicine

## 2021-05-16 ENCOUNTER — Other Ambulatory Visit: Payer: Self-pay

## 2021-05-16 ENCOUNTER — Ambulatory Visit (INDEPENDENT_AMBULATORY_CARE_PROVIDER_SITE_OTHER): Payer: Medicare Other | Admitting: Internal Medicine

## 2021-05-16 ENCOUNTER — Ambulatory Visit: Payer: Medicare Other | Attending: Internal Medicine

## 2021-05-16 VITALS — BP 120/62 | HR 66 | Temp 97.6°F | Resp 20 | Ht 72.0 in | Wt 192.0 lb

## 2021-05-16 DIAGNOSIS — R0902 Hypoxemia: Secondary | ICD-10-CM | POA: Diagnosis not present

## 2021-05-16 DIAGNOSIS — I480 Paroxysmal atrial fibrillation: Secondary | ICD-10-CM | POA: Diagnosis not present

## 2021-05-16 DIAGNOSIS — J449 Chronic obstructive pulmonary disease, unspecified: Secondary | ICD-10-CM | POA: Diagnosis not present

## 2021-05-16 DIAGNOSIS — I10 Essential (primary) hypertension: Secondary | ICD-10-CM | POA: Diagnosis not present

## 2021-05-16 DIAGNOSIS — Z23 Encounter for immunization: Secondary | ICD-10-CM

## 2021-05-16 DIAGNOSIS — D649 Anemia, unspecified: Secondary | ICD-10-CM | POA: Diagnosis not present

## 2021-05-16 LAB — CBC WITH DIFFERENTIAL/PLATELET
Basophils Absolute: 0.1 10*3/uL (ref 0.0–0.1)
Basophils Relative: 0.8 % (ref 0.0–3.0)
Eosinophils Absolute: 0.2 10*3/uL (ref 0.0–0.7)
Eosinophils Relative: 3.9 % (ref 0.0–5.0)
HCT: 36.2 % — ABNORMAL LOW (ref 39.0–52.0)
Hemoglobin: 11.8 g/dL — ABNORMAL LOW (ref 13.0–17.0)
Lymphocytes Relative: 10.1 % — ABNORMAL LOW (ref 12.0–46.0)
Lymphs Abs: 0.6 10*3/uL — ABNORMAL LOW (ref 0.7–4.0)
MCHC: 32.5 g/dL (ref 30.0–36.0)
MCV: 81.8 fl (ref 78.0–100.0)
Monocytes Absolute: 0.7 10*3/uL (ref 0.1–1.0)
Monocytes Relative: 11.3 % (ref 3.0–12.0)
Neutro Abs: 4.7 10*3/uL (ref 1.4–7.7)
Neutrophils Relative %: 73.9 % (ref 43.0–77.0)
Platelets: 284 10*3/uL (ref 150.0–400.0)
RBC: 4.43 Mil/uL (ref 4.22–5.81)
RDW: 21.9 % — ABNORMAL HIGH (ref 11.5–15.5)
WBC: 6.3 10*3/uL (ref 4.0–10.5)

## 2021-05-16 LAB — IBC + FERRITIN
Ferritin: 85.1 ng/mL (ref 22.0–322.0)
Iron: 39 ug/dL — ABNORMAL LOW (ref 42–165)
Saturation Ratios: 10 % — ABNORMAL LOW (ref 20.0–50.0)
TIBC: 390.6 ug/dL (ref 250.0–450.0)
Transferrin: 279 mg/dL (ref 212.0–360.0)

## 2021-05-16 MED ORDER — BETAMETHASONE DIPROPIONATE AUG 0.05 % EX CREA: TOPICAL_CREAM | Freq: Two times a day (BID) | CUTANEOUS | 1 refills | Status: DC | PRN

## 2021-05-16 NOTE — Progress Notes (Signed)
Subjective:    Patient ID: Sean Riley, male    DOB: Aug 15, 1950, 70 y.o.   MRN: [id number]  DOS:  05/16/2021 Type of visit - description: Follow-up  Here with his wife.  Today with talk about all his chronic medical problems. Also review all his preventive care. In general he feels well/stable.   Wt Readings from Last 3 Encounters:  05/16/21 192 lb (87.1 kg)  04/22/21 197 lb (89.4 kg)  04/15/21 190 lb 3.2 oz (86.3 kg)      Review of Systems Denies any nausea, vomiting.  No diarrhea or blood in the stools. No abdominal pain no GERD symptoms. Continue with a area on the back that itches, request a refill on topical steroids   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 LUTS    Hypertension    Microscopic hematuria    negative cystoscopy   NONSPECIFIC ABNORMAL ELECTROCARDIOGRAM    Pleural effusion on right    Pleural effusion, bilateral    S/P aortic dissection repair 03/24/2017   Biological Bentall aortic root replacement + resection and grafting of entire ascending aorta, transverse aortic arch and proximal descending thoracic aorta with elephant trunk distal anastomosis and debranching of aortic arch vessels   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   25 mm [facility] Ease bovine pericardial tissue valve and 28 mm Gelweave Valsalva aortic root graft with reimplantation of left main and right coronary arteries   Varicose veins     Past Surgical History:  Procedure Laterality Date   APPENDECTOMY     CARDIAC CATHETERIZATION      X3;Dr Caryl Comes, last 01/20/12: mild non-obstructive CAD, normal LV systolic function   CARDIOVERSION N/A 05/31/2018   Procedure: CARDIOVERSION;   Surgeon: Sueanne Margarita, MD;  Location: [facility] ENDOSCOPY;  Service: Cardiovascular;  Laterality: N/A;   CARDIOVERSION N/A 06/13/2020   Procedure: CARDIOVERSION;  Surgeon: Larey Dresser, MD;  Location: [facility] ENDOSCOPY;  Service: Cardiovascular;  Laterality: N/A;   COLONOSCOPY     COLONOSCOPY N/A 01/27/2021   Procedure: COLONOSCOPY;  Surgeon: Gatha Mayer, MD;  Location: [facility] ENDOSCOPY;  Service: Endoscopy;  Laterality: N/A;   CYSTOSCOPY  1978   Dr Hartley Barefoot   ESOPHAGOGASTRODUODENOSCOPY N/A 01/27/2021   Procedure: ESOPHAGOGASTRODUODENOSCOPY (EGD);  Surgeon: Gatha Mayer, MD;  Location: [facility] ENDOSCOPY;  Service: Endoscopy;  Laterality: N/A;   EYE SURGERY     GIVENS CAPSULE STUDY N/A 01/27/2021   Procedure: GIVENS CAPSULE STUDY;  Surgeon: Gatha Mayer, MD;  Location: [facility];  Service: Endoscopy;  Laterality: N/A;   IR THORACENTESIS ASP PLEURAL SPACE W/IMG GUIDE  11/25/2017   KNEE ARTHROSCOPY  2012   Dr Theda Sers   LUMBAR LAMINECTOMY  1987   Dr Lestine Box  2007   PERICARDIOCENTESIS N/A 04/07/2017   Procedure: PERICARDIOCENTESIS;  Surgeon: Sherren Mocha, MD;  Location: [facility] CV LAB;  Service: Cardiovascular;  Laterality: N/A;   POLYPECTOMY     POLYPECTOMY  01/27/2021   Procedure: POLYPECTOMY;  Surgeon: Gatha Mayer, MD;  Location: [place];  Service: Endoscopy;;   PRESSURE SENSOR/CARDIOMEMS N/A 12/26/2020   Procedure: PRESSURE SENSOR/CARDIOMEMS;  Surgeon: Larey Dresser, MD;  Location: [place] CV LAB;  Service: Cardiovascular;  Laterality: N/A;   REPAIR OF ACUTE ASCENDING THORACIC AORTIC DISSECTION N/A 03/23/2017   Procedure: REPAIR OF ACUTE ASCENDING THORACIC AORTIC DISSECTION.  Bentall procedure.  Aortic root repleacement with bioprosthetic valve.  Reimplantation of left and right coronary arteries.  Total resection of transverse aortic arch.  Elephant trunk distal anastomosis and debranching of arch vessels.;  Surgeon: Rexene Alberts, MD;  Location: [place];   Service: Vascular;  Laterality: N/A;   RIGHT HEART CATH N/A 06/15/2019   Procedure: RIGHT HEART CATH;  Surgeon: Larey Dresser, MD;  Location: [place] CV LAB;  Service: Cardiovascular;  Laterality: N/A;   ROTATOR CUFF REPAIR     SHOULDER ARTHROSCOPY  2011   Dr Theda Sers   SHOULDER ARTHROSCOPY Right 08/2015   SPINAL FUSION  1986   Dr Rolin Barry   TEE WITHOUT CARDIOVERSION N/A 03/23/2017   Procedure: TRANSESOPHAGEAL ECHOCARDIOGRAM (TEE);  Surgeon: Rexene Alberts, MD;  Location: [facility];  Service: Open Heart Surgery;  Laterality: N/A;   THORACIC AORTIC ENDOVASCULAR STENT GRAFT N/A 03/30/2018   Procedure: THORACIC AORTIC ENDOVASCULAR STENT GRAFT WITH INTRAVASCULAR ULTRASOUND;  Surgeon: Serafina Mitchell, MD;  Location: [facility] OR;  Service: Vascular;  Laterality: N/A;   TRACHEOSTOMY     age 74 for croup    Allergies as of 05/16/2021       Reactions   Simvastatin Other (See Comments)   Mental status changes   Celecoxib Other (See Comments)   GI UPSET AND INFLAMMATION   Codeine Other (See Comments)   GI UPSET AND INFLAMMATION   Nsaids Other (See Comments)   GI UPSET AND INFLAMMATION (can tolerate via IV)   Tape Other (See Comments)   Medical tape and Band-Aids PULL OFF THE SKIN; please use Coban wrap   Cephalexin Itching, Rash   Latex Rash        Medication List        Accurate as of May 16, 2021 11:59 PM. If you have any questions, ask your nurse or doctor.          STOP taking these medications    azithromycin 250 MG tablet Commonly known as: ZITHROMAX Stopped by: Kathlene November, MD       TAKE these medications    albuterol 108 (90 Base) MCG/ACT inhaler Commonly known as: ProAir HFA Inhale 2 puffs into the lungs every 4 (four) hours as needed for wheezing or shortness of breath.   ALPRAZolam 1 MG tablet Commonly known as: XANAX Take 1 tablet (1 mg total) by mouth at bedtime.   augmented betamethasone dipropionate 0.05 % cream Commonly known as: DIPROLENE-AF Apply  topically 2 (two) times daily as needed.   bisacodyl 5 MG EC tablet Commonly known as: DULCOLAX Take 5 mg by mouth daily as needed for moderate constipation.   Breztri Aerosphere 160-9-4.8 MCG/ACT Aero Generic drug: Budeson-Glycopyrrol-Formoterol Inhale 2 puffs into the lungs in the morning and at bedtime.   diphenhydrAMINE 25 MG tablet Commonly known as: BENADRYL Take 25 mg by mouth daily as needed (allergies/runny nose).   Eliquis 5 MG Tabs tablet Generic drug: apixaban Take 1 tablet (5 mg total) by mouth 2 (two) times daily for 1 day.   escitalopram 10 MG tablet Commonly known as: LEXAPRO TAKE 1 TABLET DAILY   esomeprazole 40 MG capsule Commonly known as: NexIUM Take 1 capsule (40 mg total) by mouth daily before breakfast.   flecainide 50 MG  tablet Commonly known as: TAMBOCOR TAKE ONE AND ONE-HALF TABLETS TWICE A DAY   fluticasone 50 MCG/ACT nasal spray Commonly known as: FLONASE Place 1 spray into both nostrils 2 (two) times daily as needed for allergies. Use one spray in each nostril twice daily.   hydroxypropyl methylcellulose / hypromellose 2.5 % ophthalmic solution Commonly known as: ISOPTO TEARS / GONIOVISC Place 1-2 drops into both eyes 3 (three) times daily as needed (dry/irritated eyes).   ipratropium-albuterol 0.5-2.5 (3) MG/3ML Soln Commonly known as: DUONEB Take 3 mLs by nebulization every 4 (four) hours as needed.   Jardiance 10 MG Tabs tablet Generic drug: empagliflozin TAKE 1 TABLET DAILY BEFORE BREAKFAST   magnesium hydroxide 400 MG/5ML suspension Commonly known as: MILK OF MAGNESIA Take 7.5 mLs by mouth daily as needed (stomach cramps/constipation).   metoprolol tartrate 25 MG tablet Commonly known as: LOPRESSOR Take 0.5 tablets (12.5 mg total) by mouth 2 (two) times daily. Please make yearly appt with Dr. Caryl Comes for January 2022 for future refills. Thank you 1st attempt   OXYGEN Inhale 3-4 L/min into the lungs continuous.   potassium  chloride SA 20 MEQ tablet Commonly known as: [place]-Con M20 Take 2 tablets (40 mEq total) by mouth every morning AND 1 tablet (20 mEq total) every evening.   rosuvastatin 5 MG tablet Commonly known as: CRESTOR Take 1 tablet (5 mg total) by mouth daily.   torsemide 20 MG tablet Commonly known as: DEMADEX Take 4 tablets (80 mg total) by mouth daily.           Objective:   Physical Exam BP 120/62 (BP Location: Left Arm, Patient Position: Sitting, Cuff Size: Small)   Pulse 66   Temp 97.6 F (36.4 C) (Oral)   Resp 20   Ht 6' (1.829 m)   Wt 192 lb (87.1 kg)   SpO2 (!) 89% Comment: 4L/min  BMI 26.04 kg/m  General:   Well developed, NAD, BMI noted.  Has his oxygen on. HEENT:  Normocephalic . Face symmetric, atraumatic Lungs:  Decreased breath sounds at the bases, worse on the left.  Few rhonchi.  No wheezing Heart: RRR, significant systolic murmur. Lower extremities: no pretibial edema bilaterally  Skin: Not pale. Not jaundice Neurologic:  alert & oriented X3.  Speech normal, gait: Not tested, sits in a wheelchair. Psych--  Cognition and judgment appear intact.  Cooperative with normal attention span and concentration.  Behavior appropriate. No anxious or depressed appearing.      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 Loanne Drilling  >> Bx (-) 10-2014 ; took tamoxifen  temporarily (self d/c 06-2016) ---Again 09/2019, labs/MMG done, on [place] agai Dermatology : eczema? rx taclonex H/o  Tracheostomy , age 28, croup Chronic anemia:  Chronic anemia per chart review,+ iron deficient (08-2018). S/p  colonoscopy 05-2016, multiple polyps. EGD 2014 for chest pain: Negative, bx results? Admitted 01/2021: Hemoglobin of 5.  Work-up negative.  PLAN: COPD, hypoxia,  home O2, pleural effusions, Since the last visit saw pulmonary twice, last OV 04/15/2021 Chest x-ray no acute, chronic pleural-parenchymal densities They suspected dyspnea is multifactorial, he was hypoxic. They rec  to continue screening for lung cancer with CT CHF, A. fib, history of aortic dissection Saw cardiology 04/09/2021: Noted to be on NSR with frequent PACs.  Continue Eliquis. BP was low, was rec to stop losartan, increase torsemide, increase potassium supplements. Subsequent BMP okay. Severe anemia: Admitted to the hospital a few months ago, GI work-up was completely normal.  I do not believe they recommended further eval. Immediately after the admission, hemoglobin increased from 8.9  to  10.6, next month (04/09/2021) hemoglobin was 10, got an IV infusion of iron 04-22-21 rx by cards .  Currently on no supplements.  No GI symptoms. Plan: CBC, iron panel.  Further advised with results. Pruritus: back pruritus w/o a rash, Uses steroid cream as needed, RF sent Anxiety insomnia: Well-controlled on Lexapro and Xanax Preventive care reviewed RTC 4 months     This visit occurred during the SARS-CoV-2 public health emergency.  Safety protocols were in place, including screening questions prior to the visit, additional usage of staff PPE, and extensive cleaning of exam room while observing appropriate contact time as indicated for disinfecting solutions.

## 2021-05-16 NOTE — Progress Notes (Signed)
   Covid-19 Vaccination Clinic  Name:  Sean Riley    MRN: [id number] DOB: 16-Nov-1950  05/16/2021  [name] was observed post Covid-19 immunization for 15 minutes without incident. He was provided with Vaccine Information Sheet and instruction to access the V-Safe system.   [name] was instructed to call 911 with any severe reactions post vaccine: Difficulty breathing  Swelling of face and throat  A fast heartbeat  A bad rash all over body  Dizziness and weakness

## 2021-05-16 NOTE — Patient Instructions (Addendum)
Recommend to proceed with the following vaccines at your pharmacy:  Shingrix (shingles) Covid #4 Tdap (tetanus): You can do it at a later time.  Take 1 multivitamin daily to help your anemia   GO TO THE LAB : Get the blood work     GO TO THE FRONT DESK, [place] back for a checkup in 3 to 4 months

## 2021-05-18 NOTE — Assessment & Plan Note (Signed)
COPD, hypoxia, home O2, pleural effusions, Since the last visit saw pulmonary twice, last OV 04/15/2021 Chest x-ray no acute, chronic pleural-parenchymal densities They suspected dyspnea is multifactorial, he was hypoxic. They rec  to continue screening for lung cancer with CT CHF, A. fib, history of aortic dissection Saw cardiology 04/09/2021: Noted to be on NSR with frequent PACs.  Continue Eliquis. BP was low, was rec to stop losartan, increase torsemide, increase potassium supplements. Subsequent BMP okay. Severe anemia: Admitted to the hospital a few months ago, GI work-up was completely normal.  I do not believe they recommended further eval. Immediately after the admission, hemoglobin increased from 8.9  to  10.6, next month (04/09/2021) hemoglobin was 10, got an IV infusion of iron 04-22-21 rx by cards .  Currently on no supplements.  No GI symptoms. Plan: CBC, iron panel.  Further advised with results. Pruritus: back pruritus w/o a rash, Uses steroid cream as needed, RF sent Anxiety insomnia: Well-controlled on Lexapro and Xanax Preventive care reviewed RTC 4 months

## 2021-05-18 NOTE — Assessment & Plan Note (Signed)
Td 09: Rec at a later time pnm 23:2015;  prevnar:2015 s/p zostavax Shingrix recommended S/p C-19 vax x3, booster today.  Flu shot today  CCS: Cscope 06-2013, 2 polyps Cscope : 05-2016, multiple polyps examined, s/p  C-scope 01/2021 due to anemia Prostate ca screening: used to see urology, last  PSA on  12/2017 = 0.78.  Given his overall health,  no screening at this point.

## 2021-05-23 NOTE — Progress Notes (Signed)
PCP: Colon Branch, MD Cardiology: Dr. Caryl Comes HF Cardiology: Dr. Aundra Dubin  71 y.o. with history of COPD, paroxysmal atrial fibrillation, type A aortic dissection, and chronic diastolic CHF was referred by Dr. Caryl Comes for evaluation of CHF.  Prior to 2018, patient reports no significant limitations.  In 8/18, he had a Type A aortic dissection.  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  The dissection extended down to the abdominal aorta.  He was a heavy smoker, quit in 8/18.  He developed paroxysmal atrial fibrillation, had DCCV in 10/19.  He is on Eliquis and flecainide now.  Last echo in 2/20 showed EF 55%, mildly decreased RV systolic function, normally functioning bioprosthetic aortic valve.  PFTs in 6/19 showed moderate-severe obstruction.    Ever since his operation in 2018, Sean Riley has had significant exertional dyspnea and has been quite limited.    He had RHC in 11/20, showing normal filling pressures, preserved cardiac output, and mild primarily pulmonary venous hypertension.    PYP scan was not suggestive of cardiac amyloidosis.   He saw pulmonary, PFTs showed moderate to severe obstruction.  He is using oxygen with exertion now, which he says helps. He is using CPAP.   Echo in 5/21 showed EF 55%, basal inferior akinesis, mild RV dilation with normal function, PASP 46 mmHg, s/p Bentall, s/p bioprosthetic aortic valve with mean gradient 16 mmHg.   11/21 had DCCV to NSR.  Had been in atrial fibrillation for a few weeks.   He was admitted in 1/22 with CHF exacerbation and diuresed.  Echo in 1/22 showed EF 60-65%, moderate RV enlargement with mildly decreased RV systolic function, PASP 44, bioprosthetic AVR with mean gradient 18 mmHg.   Cardiomems implant 5/22.    Patient was admitted in 6/22 with GI bleeding.  EGD, colonoscopy, and capsule endoscopy were all unremarkable.  No source of bleeding identified.   He returns to  clinic today for followup of CHF.  He is doing PT at home.  Still very weak.  BP is low today and he is lightheaded with standing.  No BRBPR/melena.  Breathing is worse, he is using more oxygen than in the past.  He is short of breath walking around the house.  No orthopnea/PND.  He has occasional wheezing.   Today he returns for HF follow up with  his wife. He wears 3.5 liters of oxygen at home. SOB with activity, but says breathing is better after finishing abx/prednisone.  Denies CP, dizziness, edema, and chronically sleeps in recliner.  Appetite ok. No fever or chills. Taking all medications. He has not been wearing cpap for ~6 months.  Cardiomems PADP 15 mmHg today (goal 16 mmHg).    ECG (personally reviewed): NSR w/ RBBB 64 bpm  Labs (8/20): LDL 74 Labs (10/20): Na 126, K 4.5, creatinine 1.15 Labs (11/20): K 3.8, creatinine 1.3 Labs (12/20): K 3.9, creatinine 1.5, myeloma panel negative Labs (3/21): K 4.1, creatinine 1.6, TSH normal Labs (5/21): BNP 142, creatinine 2 Labs (9/21): K 4.5, creatinine 1.8 Labs (10/21): K 4, creatinine 1.55, BNP 118 Labs (2/22): K 4.3, creatinine 1.85 Labs (4/22): K 4.2, creatinine 1.70 Labs (5/22): K 4.8, creatinine 1.95  Labs (7/22): K 3.7, creatinine 1.57, hgb 10.6 Labs (9/22): K 3.7, creatinine 1.34, hgb 11.8  PMH: 1. COPD: PFTs (6/19) with moderate-severe obstruction.  Prior heavy smoker, quit 2018.  - Uses home oxygen.  2. Atrial fibrillation: Paroxysmal. DCCV in 10/19, now on flecainide.  -  DCCV to NSR in 11/21.  3. HTN 4. Chronic diastolic CHF: Echo (8/18) with EF 55%, mild LVH, mild LV dilation, mild RV dilation with mildly decreased systolic function, bioprosthetic aortic valve with mean gradient 15 mmHg.  - RHC (11/20): mean RA 9, PA 41/15, mean PCWP 13, CI 2.81, PVR 2.15 WU.   - PYP scan not suggestive of cardiac amyloidosis.  - Echo (5/21): EF 55%, basal inferior akinesis, mild RV dilation with normal function, PASP 46 mmHg, s/p Bentall,  s/p bioprosthetic aortic valve with mean gradient 16 mmHg.  - Echo (1/22): EF 60-65%, moderate RV enlargement with mildly decreased RV systolic function, PASP 44, bioprosthetic AVR with mean gradient 18 mmHg.  - Cardiomems implant 5/22.  RHC (5/22): mean RA 11, PA 58/16, PCWP mean 18, CI 2.56, PVR 2.95 WU.  5. Type A aortic dissection: 8/18, patient had emergency repair with Bentall/bioprosthetic aortic valve.  Dissection extends down to abdominal aorta.  Coronaries re-implanted. Complicated by pericardial effusion requiring pericardiocentesis.  6. Chronic hyponatremia.  7. OSA: He is using CPAP.  8. ABIs (7/21): Normal 9. GI bleeding: 6/22, EGD/c-scope/capsule endoscopy all unrevealing.   FH: Uncle with aortic dissection, mother with SCD at age 51.   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  Married, prior heavy smoker but quit in 2018.   ROS: All systems reviewed and negative except as per HPI.   Current Outpatient Medications  Medication Sig Dispense Refill   albuterol (PROAIR HFA) 108 (90 Base) MCG/ACT inhaler Inhale 2 puffs into the lungs every 4 (four) hours as needed for wheezing or shortness of breath. 54 g 3   ALPRAZolam (XANAX) 1 MG tablet Take 1 tablet (1 mg total) by mouth at bedtime. 30 tablet 2   apixaban (ELIQUIS) 5 MG TABS tablet Take 1 tablet (5 mg total) by mouth 2 (two) times daily for 1 day. 180 tablet 2   augmented betamethasone dipropionate (DIPROLENE-AF) 0.05 % cream Apply topically 2 (two) times daily as needed. 50 g 1   bisacodyl (DULCOLAX) 5 MG EC tablet Take 5 mg by mouth daily as needed for moderate constipation.     Budeson-Glycopyrrol-Formoterol (BREZTRI AEROSPHERE) 160-9-4.8 MCG/ACT AERO Inhale 2 puffs into the lungs in the morning and at bedtime. 10.7 g 5   diphenhydrAMINE (BENADRYL) 25 MG tablet Take 25 mg by mouth daily as needed (allergies/runny nose).     escitalopram (LEXAPRO) 10 MG tablet TAKE 1 TABLET DAILY 90 tablet 3   esomeprazole (NEXIUM) 40 MG capsule  Take 1 capsule (40 mg total) by mouth daily before breakfast. 90 capsule 3   flecainide (TAMBOCOR) 50 MG tablet TAKE ONE AND ONE-HALF TABLETS TWICE A DAY 270 tablet 2   fluticasone (FLONASE) 50 MCG/ACT nasal spray Place 1 spray into both nostrils 2 (two) times daily as needed for allergies. Use one spray in each nostril twice daily.     hydroxypropyl methylcellulose / hypromellose (ISOPTO TEARS / GONIOVISC) 2.5 % ophthalmic solution Place 1-2 drops into both eyes 3 (three) times daily as needed (dry/irritated eyes).     ipratropium-albuterol (DUONEB) 0.5-2.5 (3) MG/3ML SOLN Take 3 mLs by nebulization every 4 (four) hours as needed. 360 mL 11   JARDIANCE 10 MG TABS tablet TAKE 1 TABLET DAILY BEFORE BREAKFAST 90 tablet 3   magnesium hydroxide (MILK OF MAGNESIA) 400 MG/5ML suspension Take 7.5 mLs by mouth daily as needed (stomach cramps/constipation).     metoprolol tartrate (LOPRESSOR) 25 MG tablet Take 0.5 tablets (12.5 mg total) by mouth  2 (two) times daily. Please make yearly appt with Dr. Caryl Comes for January 2022 for future refills. Thank you 1st attempt 180 tablet 1   OXYGEN Inhale 3-4 L/min into the lungs continuous.     potassium chloride SA (KLOR-CON M20) 20 MEQ tablet Take 2 tablets (40 mEq total) by mouth every morning AND 1 tablet (20 mEq total) every evening. 270 tablet 3   rosuvastatin (CRESTOR) 5 MG tablet Take 1 tablet (5 mg total) by mouth daily. 90 tablet 1   torsemide (DEMADEX) 20 MG tablet Take 4 tablets (80 mg total) by mouth daily. 360 tablet 3   No current facility-administered medications for this encounter.   Wt Readings from Last 3 Encounters:  05/26/21 87.5 kg (193 lb)  05/16/21 87.1 kg (192 lb)  04/22/21 89.4 kg (197 lb)   BP 128/78   Pulse 62   Wt 87.5 kg (193 lb)   SpO2 95%   BMI 26.18 kg/m   PHYSICAL EXAM: General:  NAD. No resp difficulty, arrived [facility] on oxygen, elderly HEENT: Normal Neck: Supple. No JVD. Carotids 2+ bilat; no bruits. No lymphadenopathy or  thryomegaly appreciated. Cor: PMI nondisplaced. Regular rate & rhythm. No rubs, gallops or murmurs. Lungs: Clear Abdomen: Soft, nontender, nondistended. No hepatosplenomegaly. No bruits or masses. Good bowel sounds. Extremities: No cyanosis, clubbing, rash, edema Neuro: Alert & oriented x 3, cranial nerves grossly intact. Moves all 4 extremities w/o difficulty. Affect pleasant.  Assessment/Plan: 1. Atrial fibrillation: Paroxysmal.  S/p DCCV in 11/21.  Maintaining NSR on ECG today with frequent PACs. HR controlled.     - Continue Eliquis 5 mg bid. No bleeding issues. - Continue flecainide + metoprolol.  2. Chronic diastolic CHF: Echo in 9/48 with EF 55%, mildly decreased RV systolic function, normally functioning bioprosthetic aortic valve. RHC in 11/20 after increasing torsemide showed near-normal filling pressures.  PYP study was not suggestive of ATTR amyloidosis and myeloma panel was negative.  Last echo in 1/22 showed EF 60-65%, moderate RV enlargement with mildly decreased RV systolic function, PASP 44, bioprosthetic AVR with mean gradient 18 mmHg.  I suspect that COPD also contributes to his exercise intolerance.  Atrial fibrillation worsens his HF, he is in NSR today. NYHA class IIIb symptoms, but confounded by COPD. He is not volume overloaded on exam or by Cardiomems - Continue torsemide 80 mg daily.  BMET today. - Continue  KCl to 40 qam/20 qpm.  - Continue Jardiance 10 mg daily.  - Off losartan with previously low BP. Will not re-challenge today as he has not had his medications yet. 3. COPD: Moderate to severe obstruction on 6/19 PFTs, prior smoker.  As above, I suspect this contributes to his dyspnea.  He is on home oxygen.  - Continue followup with pulmonary.  4. OSA:  Encouraged compliance with CPAP.    5. Aortic dissection: S/p Type A aortic dissection, s/p Bentall with bioprosthetic aortic valve. Echo 1/22 showed stable bioprosthetic aortic valve.  Dr. Trula Slade has been following  his residual dissected aorta.   - Given family history of aortic disease (uncle with aortic dissection, mother with SCD at age 11 - ? from dissection), his children's aortas need to be screened due to risk for inherited connective tissue disorder.  This was discussed with Dr. Aundra Dubin and he reports they have been screened.  He is not Marfanoid.   6. GI bleeding: 6/22, endoscopic evaluation was unrevealing. No abnormal bleeding. - Received Feraheme infusion previously. Recent Hgb 11.8. We discussed taking  OTC iron supplement per PCP instructions.  Follow up in 2-3 months with Dr. Wynema Birch [facility] Sean Riley 05/26/2021

## 2021-05-26 ENCOUNTER — Ambulatory Visit (HOSPITAL_COMMUNITY)
Admission: RE | Admit: 2021-05-26 | Discharge: 2021-05-26 | Disposition: A | Discharge status: Home / Self Care | Payer: Medicare Other | Source: Ambulatory Visit | Attending: Family Medicine | Admitting: Family Medicine

## 2021-05-26 ENCOUNTER — Encounter (HOSPITAL_COMMUNITY): Payer: Self-pay

## 2021-05-26 ENCOUNTER — Other Ambulatory Visit: Payer: Self-pay

## 2021-05-26 VITALS — BP 128/78 | HR 62 | Wt 193.0 lb

## 2021-05-26 DIAGNOSIS — G4733 Obstructive sleep apnea (adult) (pediatric): Secondary | ICD-10-CM | POA: Diagnosis not present

## 2021-05-26 DIAGNOSIS — Z952 Presence of prosthetic heart valve: Secondary | ICD-10-CM | POA: Diagnosis not present

## 2021-05-26 DIAGNOSIS — I5032 Chronic diastolic (congestive) heart failure: Secondary | ICD-10-CM | POA: Insufficient documentation

## 2021-05-26 DIAGNOSIS — I110 Hypertensive heart disease with heart failure: Secondary | ICD-10-CM | POA: Insufficient documentation

## 2021-05-26 DIAGNOSIS — Z79899 Other long term (current) drug therapy: Secondary | ICD-10-CM | POA: Diagnosis not present

## 2021-05-26 DIAGNOSIS — I959 Hypotension, unspecified: Secondary | ICD-10-CM | POA: Diagnosis not present

## 2021-05-26 DIAGNOSIS — Z7901 Long term (current) use of anticoagulants: Secondary | ICD-10-CM | POA: Insufficient documentation

## 2021-05-26 DIAGNOSIS — Z8719 Personal history of other diseases of the digestive system: Secondary | ICD-10-CM

## 2021-05-26 DIAGNOSIS — J449 Chronic obstructive pulmonary disease, unspecified: Secondary | ICD-10-CM | POA: Insufficient documentation

## 2021-05-26 DIAGNOSIS — Z09 Encounter for follow-up examination after completed treatment for conditions other than malignant neoplasm: Secondary | ICD-10-CM | POA: Diagnosis not present

## 2021-05-26 DIAGNOSIS — R0602 Shortness of breath: Secondary | ICD-10-CM | POA: Diagnosis not present

## 2021-05-26 DIAGNOSIS — R42 Dizziness and giddiness: Secondary | ICD-10-CM | POA: Insufficient documentation

## 2021-05-26 DIAGNOSIS — Z7984 Long term (current) use of oral hypoglycemic drugs: Secondary | ICD-10-CM | POA: Insufficient documentation

## 2021-05-26 DIAGNOSIS — I480 Paroxysmal atrial fibrillation: Secondary | ICD-10-CM | POA: Insufficient documentation

## 2021-05-26 DIAGNOSIS — Z9981 Dependence on supplemental oxygen: Secondary | ICD-10-CM | POA: Insufficient documentation

## 2021-05-26 DIAGNOSIS — Z87891 Personal history of nicotine dependence: Secondary | ICD-10-CM | POA: Diagnosis not present

## 2021-05-26 DIAGNOSIS — Z953 Presence of xenogenic heart valve: Secondary | ICD-10-CM | POA: Insufficient documentation

## 2021-05-26 DIAGNOSIS — R531 Weakness: Secondary | ICD-10-CM | POA: Insufficient documentation

## 2021-05-26 LAB — BASIC METABOLIC PANEL
Anion gap: 8 (ref 5–15)
BUN: 13 mg/dL (ref 8–23)
CO2: 30 mmol/L (ref 22–32)
Calcium: 8.7 mg/dL — ABNORMAL LOW (ref 8.9–10.3)
Chloride: 91 mmol/L — ABNORMAL LOW (ref 98–111)
Creatinine, Ser: 1.34 mg/dL — ABNORMAL HIGH (ref 0.61–1.24)
GFR, Estimated: 57 mL/min — ABNORMAL LOW (ref >60)
Glucose, Bld: 95 mg/dL (ref 70–99)
Potassium: 3.6 mmol/L (ref 3.5–5.1)
Sodium: 129 mmol/L — ABNORMAL LOW (ref 135–145)

## 2021-05-26 NOTE — Patient Instructions (Signed)
Labs were done today, if any labs are abnormal the clinic will call you  Your physician recommends that you schedule a follow-up appointment in: 2-3 months  At the [facility], you and your health needs are our priority. As part of our continuing mission to provide you with exceptional heart care, we have created designated Provider Care Teams. These Care Teams include your primary Cardiologist (physician) and Advanced Practice Providers (APPs- Physician Assistants and Nurse Practitioners) who all work together to provide you with the care you need, when you need it.   You may see any of the following providers on your designated Care Team at your next follow up: Dr Glori Bickers Dr [name] Dr Patrice Paradise, NP Lyda Jester, [place] Ginnie Smart Audry Riles, PharmD   Please be sure to bring in all your medications bottles to every appointment.   If you have any questions or concerns before your next appointment please send [name] a message through [place] or call our office at [phone].    TO LEAVE A MESSAGE FOR THE NURSE SELECT OPTION 2, PLEASE LEAVE A MESSAGE INCLUDING: YOUR NAME DATE OF BIRTH CALL BACK NUMBER REASON FOR CALL**this is important as we prioritize the call backs  YOU WILL RECEIVE A CALL BACK THE SAME DAY AS LONG AS YOU CALL BEFORE 4:00 PM

## 2021-05-27 ENCOUNTER — Other Ambulatory Visit (HOSPITAL_BASED_OUTPATIENT_CLINIC_OR_DEPARTMENT_OTHER): Payer: Self-pay

## 2021-05-27 MED ORDER — COVID-19 MRNA VACC (MODERNA) 100 MCG/0.5ML IM SUSP
INTRAMUSCULAR | 0 refills | Status: DC
  Filled 2021-05-27: qty 0.5, 1d supply, fill #0

## 2021-05-29 ENCOUNTER — Ambulatory Visit: Payer: Medicare Other | Admitting: Pulmonary Disease

## 2021-06-09 ENCOUNTER — Ambulatory Visit (INDEPENDENT_AMBULATORY_CARE_PROVIDER_SITE_OTHER): Payer: Medicare Other | Admitting: Pulmonary Disease

## 2021-06-09 ENCOUNTER — Encounter: Payer: Self-pay | Admitting: Pulmonary Disease

## 2021-06-09 ENCOUNTER — Other Ambulatory Visit: Payer: Self-pay

## 2021-06-09 VITALS — BP 116/70 | HR 82 | Temp 97.4°F | Ht 73.0 in | Wt 197.0 lb

## 2021-06-09 DIAGNOSIS — J449 Chronic obstructive pulmonary disease, unspecified: Secondary | ICD-10-CM | POA: Diagnosis not present

## 2021-06-09 MED ORDER — BREZTRI AEROSPHERE 160-9-4.8 MCG/ACT IN AERO: 2.0000 | INHALATION_SPRAY | Freq: Two times a day (BID) | RESPIRATORY_TRACT | 3 refills | Status: DC

## 2021-06-09 MED ORDER — ALBUTEROL SULFATE HFA 108 (90 BASE) MCG/ACT IN AERS: 2.0000 | INHALATION_SPRAY | RESPIRATORY_TRACT | 3 refills | Status: AC | PRN

## 2021-06-09 NOTE — Progress Notes (Signed)
Sean Riley    [id number]    10-17-1950  Primary Care Physician:Paz, Alda Berthold, MD  Referring Physician: Colon Branch, MD [address] [place],  [place] [address]  Chief complaint: Follow-up for COPD, pleural effusions  HPI: 70 year old heavy smoker with history of atrial fibrillation on Eliquis, COPD, ascending aortic dissection status post repair, aortic valve replacement on 03/24/17. Followed with Dr. Roxy Manns.  CT scan in January showed large bilateral effusion.  He was started on Lasix with improvement in effusion  He has followed up with Dr. Ricard Dillon and underwent thoracentesis of the right pleural effusion on 11/25/17.  I do not see any pleural studies sent from the procedure. Underwent endovascular repair of thoracic aortic aneurysm on 03/30/2018 without any respiratory issue  Hospitalized in June 2022 for symptomatic GI bleed with anemia.  Underwent EGD, colonoscopy and capsule study with no bleeding site identified.    Has been unable to tolerate breo, incruse and trelegy due to hoarseness of voice He was started on Dulera inhaler but unable to continue due to insurance issues.  Started on breztri inhaler He was seen by Dr. Aundra Dubin on 04/09/2021 and diuretic dose increased for volume overload.  He still continues to be symptomatic  Pets: Dog, no birds, lives in a farm with cows. Occupation: Retired Museum/gallery curator. Exposures: No known exposures, no mold, he has a hot tub but does not use it Smoking history: 90-pack-year smoking history.  Quit in 2018 Travel History: Not significant  Interim history: Seen in September 2022 with acute exacerbation treated with Z-Pak and prednisone.  Chest x-ray at that time showed chronic changes with no acute infiltrate Overall he is improved with this treatment Continues on breztri and supplemental oxygen with chronic dyspnea on exertion  Outpatient Encounter Medications as of 06/09/2021  Medication Sig   albuterol  (PROAIR HFA) 108 (90 Base) MCG/ACT inhaler Inhale 2 puffs into the lungs every 4 (four) hours as needed for wheezing or shortness of breath.   ALPRAZolam (XANAX) 1 MG tablet Take 1 tablet (1 mg total) by mouth at bedtime.   apixaban (ELIQUIS) 5 MG TABS tablet Take 1 tablet (5 mg total) by mouth 2 (two) times daily for 1 day.   augmented betamethasone dipropionate (DIPROLENE-AF) 0.05 % cream Apply topically 2 (two) times daily as needed.   bisacodyl (DULCOLAX) 5 MG EC tablet Take 5 mg by mouth daily as needed for moderate constipation.   Budeson-Glycopyrrol-Formoterol (BREZTRI AEROSPHERE) 160-9-4.8 MCG/ACT AERO Inhale 2 puffs into the lungs in the morning and at bedtime.   COVID-19 mRNA vaccine, Moderna, 100 MCG/0.5ML injection Inject into the muscle.   diphenhydrAMINE (BENADRYL) 25 MG tablet Take 25 mg by mouth daily as needed (allergies/runny nose).   escitalopram (LEXAPRO) 10 MG tablet TAKE 1 TABLET DAILY   esomeprazole (NEXIUM) 40 MG capsule Take 1 capsule (40 mg total) by mouth daily before breakfast.   flecainide (TAMBOCOR) 50 MG tablet TAKE ONE AND ONE-HALF TABLETS TWICE A DAY   fluticasone (FLONASE) 50 MCG/ACT nasal spray Place 1 spray into both nostrils 2 (two) times daily as needed for allergies. Use one spray in each nostril twice daily.   hydroxypropyl methylcellulose / hypromellose (ISOPTO TEARS / GONIOVISC) 2.5 % ophthalmic solution Place 1-2 drops into both eyes 3 (three) times daily as needed (dry/irritated eyes).   ipratropium-albuterol (DUONEB) 0.5-2.5 (3) MG/3ML SOLN Take 3 mLs by nebulization every 4 (four) hours as needed.  JARDIANCE 10 MG TABS tablet TAKE 1 TABLET DAILY BEFORE BREAKFAST   magnesium hydroxide (MILK OF MAGNESIA) 400 MG/5ML suspension Take 7.5 mLs by mouth daily as needed (stomach cramps/constipation).   metoprolol tartrate (LOPRESSOR) 25 MG tablet Take 0.5 tablets (12.5 mg total) by mouth 2 (two) times daily. Please make yearly appt with Dr. Caryl Comes for January 2022  for future refills. Thank you 1st attempt   OXYGEN Inhale 3-4 L/min into the lungs continuous.   potassium chloride SA (KLOR-CON M20) 20 MEQ tablet Take 2 tablets (40 mEq total) by mouth every morning AND 1 tablet (20 mEq total) every evening.   rosuvastatin (CRESTOR) 5 MG tablet Take 1 tablet (5 mg total) by mouth daily.   torsemide (DEMADEX) 20 MG tablet Take 4 tablets (80 mg total) by mouth daily.   No facility-administered encounter medications on file as of 06/09/2021.   Physical Exam: Blood pressure 116/70, pulse 82, temperature (!) 97.4 F (36.3 C), temperature source Oral, height 6\' 1"  (1.854 m), weight 197 lb (89.4 kg), SpO2 94 %. Gen:      No acute distress HEENT:  EOMI, sclera anicteric Neck:     No masses; no thyromegaly Lungs:    Clear to auscultation bilaterally; normal respiratory effort CV:         Regular rate and rhythm; no murmurs Abd:      + bowel sounds; soft, non-tender; no palpable masses, no distension Ext:    No edema; adequate peripheral perfusion Skin:      Warm and dry; no rash Neuro: alert and oriented x 3 Psych: normal mood and affect   Data Reviewed: Imaging CT 03/22/17- moderate emphysema, left upper lobe 3.6 mm nodule.  CT angio 08/23/17-emphysema, large-moderate right and left effusion.  Screening CT 10/11/2019-lobular emphysema, calcified granuloma in the right upper lobe.  Small partially loculated bilateral effusions with scarring.  CTA 01/29/2020-stable emphysema, bilateral effusions  Screening CT chest 11/13/2020-small stable pulmonary nodules, atelectasis, small bilateral effusions, emphysema.  CT abdomen pelvis 01/29/2021-increase in size of effusions.  Chest x-ray 04/15/2021-chronic interstitial changes, loculated effusion. I have reviewed the images personally.  PFTs 01/05/17-FVC 4.02 [80%], FEV1 3.03 [81%], F/F 75 12/24/15-FVC 4.27 [91%], FEV1 2.39 [86%], F/F 79 Normal spirometry  12/25/13 FVC 4.96 [103%], FEV1 3.51 [97%], F/F 71, p.o. 314%,  DLCO 72% Minimal obstruction, small airway disease, minimal diffusion defect  01/25/2018 FVC 2.92 (62%], FEV1 1.98 [57%], F/F 68, TLC 66%, DLCO 32% Moderate-severe obstruction, severe restriction and diffusion defect.  Labs Blood allergy profile 01/25/2018- IgE 841, sensitive to grass, cat, dog, tree pollen CBC 01/25/2018- WBC 6.7, eos 4.8%, absolute eosinophil count 300 Alpha-1 antitrypsin 01/25/2018- 166, PI MM  Assessment:  COPD PFTs reviewed with moderate-severe obstruction Has not tolerated multiple inhalers in the past due to hoarseness Currently on breztri inhaler with spacer, continue duo nebs  Suspect dyspnea is multifactorial from COPD, CHF with pleural effusions, atrial fibrillation.  Low suspicion for PE as he is already on anticoagulation   Pleural effusions CT scan reviewed with bilateral effusions that are partially loculated with some scarring These effusions are present since aortic surgery in 2018.  Suspect this is postoperative fluid collections which are chronic now.  Also may have a component of CHF secondary to chronic diastolic heart failure, paroxysmal atrial fibrillation. No evidence of interstitial lung disease. Continue monitoring Chest x-ray as above  OSA Continue CPAP.  Health maintenance Pneumovax 10/16/13, Prevnar 05/10/14 Vaccinated against Covid.  Recommended booster vaccination Continue low-dose screening CTs of  chest  Plan/Recommendations: Continue breztri, duonebs  Follow-up in 6 months  [name] MD [facility] 06/09/2021, 11:43 AM  CC: Colon Branch, MD

## 2021-06-09 NOTE — Addendum Note (Signed)
Addended by: Elton Sin on: 06/09/2021 12:49 PM   Modules accepted: Orders

## 2021-06-09 NOTE — Patient Instructions (Signed)
Continue inhalers and nebulizer Follow-up in 6 months

## 2021-06-17 ENCOUNTER — Other Ambulatory Visit: Payer: Self-pay | Admitting: Internal Medicine

## 2021-06-17 NOTE — Telephone Encounter (Signed)
Requesting: alprazolam 1mg  Contract: UDS: 04/2018 Last Visit: 05/16/2021 Next Visit: 09/16/2020 Last Refill: 02/13/2021 #30 x 2.   Please Advise

## 2021-06-17 NOTE — Telephone Encounter (Signed)
PDMP okay, Rx sent 

## 2021-06-23 ENCOUNTER — Other Ambulatory Visit: Payer: Self-pay

## 2021-06-23 ENCOUNTER — Ambulatory Visit (INDEPENDENT_AMBULATORY_CARE_PROVIDER_SITE_OTHER): Payer: Medicare Other | Admitting: Family Medicine

## 2021-06-23 ENCOUNTER — Encounter: Payer: Self-pay | Admitting: Family Medicine

## 2021-06-23 VITALS — BP 130/70 | HR 63 | Temp 97.5°F | Resp 20 | Ht 73.0 in | Wt 191.0 lb

## 2021-06-23 DIAGNOSIS — L03116 Cellulitis of left lower limb: Secondary | ICD-10-CM | POA: Diagnosis not present

## 2021-06-23 MED ORDER — CEFTRIAXONE SODIUM 1 G IJ SOLR
1.0000 g | Freq: Once | INTRAMUSCULAR | Status: AC
  Administered 2021-06-23: 1 g via INTRAMUSCULAR

## 2021-06-23 MED ORDER — DOXYCYCLINE HYCLATE 100 MG PO TABS: 100.0000 mg | ORAL_TABLET | Freq: Two times a day (BID) | ORAL | 0 refills | Status: DC

## 2021-06-23 NOTE — Assessment & Plan Note (Signed)
abx per orders Rocephin 1 gm IM Wound bandaged  F/u Thursday of Friday with pcp for f/u

## 2021-06-23 NOTE — Patient Instructions (Signed)

## 2021-06-23 NOTE — Progress Notes (Addendum)
Subjective:   By signing my name below, I, Zite Okoli, attest that this documentation has been prepared under the direction and in the presence of Ann Held, DO. 06/23/2021    Patient ID: Sean Riley, male    DOB: June 20, 1951, 70 y.o.   MRN: [id number]  Chief Complaint  Patient presents with   Joint Swelling    Left ankle swelling, x3 weeks, no pain or numbness.     HPI Patient is in today for an office visit. He is accompanied by his wife.  His wife reports his left ankle has been swelling and draining for the past three days. His wife mentions they have ben soaking the foot in epsom salt for the past couple of nights. He has not had a fever and he has no history of gout.  She also mentions that his left ankle started swelling about 3 years ago and he was told there is good circulation in the foot.  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 LUTS    Hypertension    Microscopic hematuria    negative cystoscopy   NONSPECIFIC ABNORMAL ELECTROCARDIOGRAM    Pleural effusion on right    Pleural effusion, bilateral    S/P aortic dissection repair 03/24/2017   Biological Bentall aortic root replacement + resection and grafting of entire ascending aorta, transverse aortic arch and proximal descending thoracic aorta with elephant trunk distal anastomosis and debranching of aortic arch vessels   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   25 mm [facility] Ease bovine pericardial tissue valve and 28 mm Gelweave Valsalva aortic root graft with reimplantation of left main and right coronary arteries   Varicose veins     Past Surgical History:  Procedure Laterality Date    APPENDECTOMY     CARDIAC CATHETERIZATION      X3;Dr Caryl Comes, last 01/20/12: mild non-obstructive CAD, normal LV systolic function   CARDIOVERSION N/A 05/31/2018   Procedure: CARDIOVERSION;  Surgeon: Sueanne Margarita, MD;  Location: [facility] ENDOSCOPY;  Service: Cardiovascular;  Laterality: N/A;   CARDIOVERSION N/A 06/13/2020   Procedure: CARDIOVERSION;  Surgeon: Larey Dresser, MD;  Location: [facility] ENDOSCOPY;  Service: Cardiovascular;  Laterality: N/A;   COLONOSCOPY     COLONOSCOPY N/A 01/27/2021   Procedure: COLONOSCOPY;  Surgeon: Gatha Mayer, MD;  Location: [facility] ENDOSCOPY;  Service: Endoscopy;  Laterality: N/A;   CYSTOSCOPY  1978   Dr Hartley Barefoot   ESOPHAGOGASTRODUODENOSCOPY N/A 01/27/2021   Procedure: ESOPHAGOGASTRODUODENOSCOPY (EGD);  Surgeon: Gatha Mayer, MD;  Location: [facility] ENDOSCOPY;  Service: Endoscopy;  Laterality: N/A;   EYE SURGERY     GIVENS CAPSULE STUDY N/A 01/27/2021   Procedure: GIVENS CAPSULE STUDY;  Surgeon: Gatha Mayer, MD;  Location: [place];  Service: Endoscopy;  Laterality: N/A;   IR THORACENTESIS ASP PLEURAL SPACE W/IMG GUIDE  11/25/2017   KNEE ARTHROSCOPY  2012   Dr Theda Sers   LUMBAR LAMINECTOMY  1987   Dr Lestine Box  2007   PERICARDIOCENTESIS N/A 04/07/2017   Procedure: PERICARDIOCENTESIS;  Surgeon: Sherren Mocha, MD;  Location: [place] CV LAB;  Service: Cardiovascular;  Laterality:  N/A;   POLYPECTOMY     POLYPECTOMY  01/27/2021   Procedure: POLYPECTOMY;  Surgeon: Gatha Mayer, MD;  Location: [facility] ENDOSCOPY;  Service: Endoscopy;;   PRESSURE SENSOR/CARDIOMEMS N/A 12/26/2020   Procedure: PRESSURE SENSOR/CARDIOMEMS;  Surgeon: Larey Dresser, MD;  Location: [place] CV LAB;  Service: Cardiovascular;  Laterality: N/A;   REPAIR OF ACUTE ASCENDING THORACIC AORTIC DISSECTION N/A 03/23/2017   Procedure: REPAIR OF ACUTE ASCENDING THORACIC AORTIC DISSECTION.  Bentall procedure.  Aortic root repleacement with bioprosthetic valve.  Reimplantation of left and  right coronary arteries.  Total resection of transverse aortic arch.  Elephant trunk distal anastomosis and debranching of arch vessels.;  Surgeon: Rexene Alberts, MD;  Location: [place];  Service: Vascular;  Laterality: N/A;   RIGHT HEART CATH N/A 06/15/2019   Procedure: RIGHT HEART CATH;  Surgeon: Larey Dresser, MD;  Location: [place] CV LAB;  Service: Cardiovascular;  Laterality: N/A;   ROTATOR CUFF REPAIR     SHOULDER ARTHROSCOPY  2011   Dr Theda Sers   SHOULDER ARTHROSCOPY Right 08/2015   SPINAL FUSION  1986   Dr Rolin Barry   TEE WITHOUT CARDIOVERSION N/A 03/23/2017   Procedure: TRANSESOPHAGEAL ECHOCARDIOGRAM (TEE);  Surgeon: Rexene Alberts, MD;  Location: [place];  Service: Open Heart Surgery;  Laterality: N/A;   THORACIC AORTIC ENDOVASCULAR STENT GRAFT N/A 03/30/2018   Procedure: THORACIC AORTIC ENDOVASCULAR STENT GRAFT WITH INTRAVASCULAR ULTRASOUND;  Surgeon: Serafina Mitchell, MD;  Location: [facility] OR;  Service: Vascular;  Laterality: N/A;   TRACHEOSTOMY     age 78 for croup    Family History  Problem Relation Age of Onset   Hypertension Mother    Hypertension Father    Benign prostatic hyperplasia Father        S/P TURP   Heart attack Maternal Grandmother        MI in 61s   Breast cancer Maternal Grandmother    Arrhythmia Brother         X 2   Heart attack Maternal Aunt        MI in 33s   Stroke Neg Hx    Diabetes Neg Hx    Colon cancer Neg Hx    Colon polyps Neg Hx    Stomach cancer Neg Hx    Rectal cancer Neg Hx    Esophageal cancer Neg Hx    Prostate cancer Neg Hx    Other Neg Hx        gynecomastia    Social History   Socioeconomic History   Marital status: Married    Spouse name: Not on file   Number of children: 2   Years of education: Not on file   Highest education level: Not on file  Occupational History   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   Tobacco Use   Smoking status: Former    Packs/day: 2.00    Years: 45.00    Pack years: 90.00    Types:  Cigarettes    Quit date: 03/22/2017    Years since quitting: 4.2   Smokeless tobacco: Never   Tobacco comments:    2 ppd , quit 03/2017  Vaping Use   Vaping Use: Never used  Substance and Sexual Activity   Alcohol use: Not Currently   Drug use: No   Sexual activity: Never  Other Topics Concern   Not on file  Social History Narrative   Lives w/ wife   Social Determinants of Health   Financial  Resource Strain: Not on file  Food Insecurity: Not on file  Transportation Needs: Not on file  Physical Activity: Not on file  Stress: Not on file  Social Connections: Not on file  Intimate Partner Violence: Not on file    Outpatient Medications Prior to Visit  Medication Sig Dispense Refill   albuterol (PROAIR HFA) 108 (90 Base) MCG/ACT inhaler Inhale 2 puffs into the lungs every 4 (four) hours as needed for wheezing or shortness of breath. 54 g 3   ALPRAZolam (XANAX) 1 MG tablet TAKE 1 TABLET BY MOUTH AT BEDTIME. 30 tablet 3   apixaban (ELIQUIS) 5 MG TABS tablet Take 1 tablet (5 mg total) by mouth 2 (two) times daily for 1 day. 180 tablet 2   augmented betamethasone dipropionate (DIPROLENE-AF) 0.05 % cream Apply topically 2 (two) times daily as needed. 50 g 1   bisacodyl (DULCOLAX) 5 MG EC tablet Take 5 mg by mouth daily as needed for moderate constipation.     Budeson-Glycopyrrol-Formoterol (BREZTRI AEROSPHERE) 160-9-4.8 MCG/ACT AERO Inhale 2 puffs into the lungs in the morning and at bedtime. 32.1 g 3   COVID-19 mRNA vaccine, Moderna, 100 MCG/0.5ML injection Inject into the muscle. 0.5 mL 0   diphenhydrAMINE (BENADRYL) 25 MG tablet Take 25 mg by mouth daily as needed (allergies/runny nose).     escitalopram (LEXAPRO) 10 MG tablet TAKE 1 TABLET DAILY 90 tablet 3   esomeprazole (NEXIUM) 40 MG capsule Take 1 capsule (40 mg total) by mouth daily before breakfast. 90 capsule 3   flecainide (TAMBOCOR) 50 MG tablet TAKE ONE AND ONE-HALF TABLETS TWICE A DAY 270 tablet 2   fluticasone (FLONASE)  50 MCG/ACT nasal spray Place 1 spray into both nostrils 2 (two) times daily as needed for allergies. Use one spray in each nostril twice daily.     hydroxypropyl methylcellulose / hypromellose (ISOPTO TEARS / GONIOVISC) 2.5 % ophthalmic solution Place 1-2 drops into both eyes 3 (three) times daily as needed (dry/irritated eyes).     ipratropium-albuterol (DUONEB) 0.5-2.5 (3) MG/3ML SOLN Take 3 mLs by nebulization every 4 (four) hours as needed. 360 mL 11   JARDIANCE 10 MG TABS tablet TAKE 1 TABLET DAILY BEFORE BREAKFAST 90 tablet 3   magnesium hydroxide (MILK OF MAGNESIA) 400 MG/5ML suspension Take 7.5 mLs by mouth daily as needed (stomach cramps/constipation).     metoprolol tartrate (LOPRESSOR) 25 MG tablet Take 0.5 tablets (12.5 mg total) by mouth 2 (two) times daily. Please make yearly appt with Dr. Caryl Comes for January 2022 for future refills. Thank you 1st attempt 180 tablet 1   OXYGEN Inhale 3-4 L/min into the lungs continuous.     potassium chloride SA (KLOR-CON M20) 20 MEQ tablet Take 2 tablets (40 mEq total) by mouth every morning AND 1 tablet (20 mEq total) every evening. 270 tablet 3   rosuvastatin (CRESTOR) 5 MG tablet Take 1 tablet (5 mg total) by mouth daily. 90 tablet 1   torsemide (DEMADEX) 20 MG tablet Take 4 tablets (80 mg total) by mouth daily. 360 tablet 3   No facility-administered medications prior to visit.    Allergies  Allergen Reactions   Simvastatin Other (See Comments)    Mental status changes   Celecoxib Other (See Comments)    GI UPSET AND INFLAMMATION   Codeine Other (See Comments)    GI UPSET AND INFLAMMATION   Nsaids Other (See Comments)    GI UPSET AND INFLAMMATION (can tolerate via IV)   Tape Other (See Comments)  Medical tape and Band-Aids PULL OFF THE SKIN; please use Coban wrap   Cephalexin Itching and Rash   Latex Rash    Review of Systems  Constitutional:  Negative for fever.  HENT:  Negative for congestion, ear pain, hearing loss, sinus pain  and sore throat.   Eyes:  Negative for blurred vision and pain.  Respiratory:  Negative for cough, sputum production, shortness of breath and wheezing.   Cardiovascular:  Positive for leg swelling (left ankle). Negative for chest pain and palpitations.  Gastrointestinal:  Negative for blood in stool, constipation, diarrhea, nausea and vomiting.  Genitourinary:  Negative for dysuria, frequency, hematuria and urgency.  Musculoskeletal:  Negative for back pain, falls and myalgias.  Neurological:  Negative for dizziness, sensory change, loss of consciousness, weakness and headaches.  Endo/Heme/Allergies:  Negative for environmental allergies. Does not bruise/bleed easily.  Psychiatric/Behavioral:  Negative for depression and suicidal ideas. The patient is not nervous/anxious and does not have insomnia.       Objective:    Physical Exam Constitutional:      General: He is not in acute distress.    Appearance: Normal appearance. He is not ill-appearing.  HENT:     Head: Normocephalic and atraumatic.     Right Ear: Tympanic membrane, ear canal and external ear normal.     Left Ear: Tympanic membrane, ear canal and external ear normal.  Eyes:     Pupils: Pupils are equal, round, and reactive to light.  Cardiovascular:     Rate and Rhythm: Normal rate and regular rhythm.     Pulses: Normal pulses.     Heart sounds: No murmur heard.   No gallop.  Pulmonary:     Effort: Pulmonary effort is normal. No respiratory distress.     Breath sounds: Normal breath sounds. No wheezing or rhonchi.  Abdominal:     General: Bowel sounds are normal. There is no distension.     Palpations: Abdomen is soft.     Tenderness: There is no abdominal tenderness. There is no guarding.     Hernia: No hernia is present.  Musculoskeletal:     Cervical back: Neck supple.     Left ankle: Swelling present. Tenderness present. Normal pulse.       Feet:     Comments: +errythema and yellow fluid draining from  wound Wound cleaned and wrapped with nonstick guaze and coban   Feet:     Comments: Cellulitis on left ankle  Lymphadenopathy:     Cervical: No cervical adenopathy.  Skin:    General: Skin is warm and dry.  Neurological:     Mental Status: He is alert and oriented to person, place, and time.       BP 130/70 (BP Location: Left Arm, Patient Position: Sitting, Cuff Size: Normal)   Pulse 63   Temp (!) 97.5 F (36.4 C) (Oral)   Resp 20   Ht 6\' 1"  (1.854 m)   Wt 161 lb 3.2 oz (73.1 kg)   SpO2 97%   BMI 21.27 kg/m  Wt Readings from Last 3 Encounters:  06/23/21 161 lb 3.2 oz (73.1 kg)  06/09/21 197 lb (89.4 kg)  05/26/21 193 lb (87.5 kg)    Diabetic Foot Exam - Simple   No data filed    Lab Results  Component Value Date   WBC 6.3 05/16/2021   HGB 11.8 (L) 05/16/2021   HCT 36.2 (L) 05/16/2021   PLT 284.0 05/16/2021   GLUCOSE 95 05/26/2021  CHOL 156 11/15/2020   TRIG 62 11/15/2020   HDL 60 11/15/2020   LDLDIRECT 137.9 04/24/2013   LDLCALC 84 11/15/2020   ALT 14 01/28/2021   AST 19 01/28/2021   NA 129 (L) 05/26/2021   K 3.6 05/26/2021   CL 91 (L) 05/26/2021   CREATININE 1.34 (H) 05/26/2021   BUN 13 05/26/2021   CO2 30 05/26/2021   TSH 1.65 10/16/2019   PSA 0.78 12/14/2017   INR 1.3 (H) 12/23/2020   HGBA1C 4.4 (L) 01/25/2021    Lab Results  Component Value Date   TSH 1.65 10/16/2019   Lab Results  Component Value Date   WBC 6.3 05/16/2021   HGB 11.8 (L) 05/16/2021   HCT 36.2 (L) 05/16/2021   MCV 81.8 05/16/2021   PLT 284.0 05/16/2021   Lab Results  Component Value Date   NA 129 (L) 05/26/2021   K 3.6 05/26/2021   CO2 30 05/26/2021   GLUCOSE 95 05/26/2021   BUN 13 05/26/2021   CREATININE 1.34 (H) 05/26/2021   BILITOT 1.1 01/28/2021   ALKPHOS 65 01/28/2021   AST 19 01/28/2021   ALT 14 01/28/2021   PROT 5.8 (L) 01/28/2021   ALBUMIN 2.9 (L) 01/28/2021   CALCIUM 8.7 (L) 05/26/2021   ANIONGAP 8 05/26/2021   GFR 44.16 (L) 02/13/2021   Lab  Results  Component Value Date   CHOL 156 11/15/2020   Lab Results  Component Value Date   HDL 60 11/15/2020   Lab Results  Component Value Date   LDLCALC 84 11/15/2020   Lab Results  Component Value Date   TRIG 62 11/15/2020   Lab Results  Component Value Date   CHOLHDL 2.6 11/15/2020   Lab Results  Component Value Date   HGBA1C 4.4 (L) 01/25/2021       Assessment & Plan:   Problem List Items Addressed This Visit       Unprioritized   Cellulitis of left ankle - Primary    abx per orders Rocephin 1 gm IM Wound bandaged  F/u Thursday of Friday with pcp for f/u      Relevant Medications   doxycycline (VIBRA-TABS) 100 MG tablet   Other Relevant Orders   CBC with Differential/Platelet   Comprehensive metabolic panel   Uric acid    Meds ordered this encounter  Medications   doxycycline (VIBRA-TABS) 100 MG tablet    Sig: Take 1 tablet (100 mg total) by mouth 2 (two) times daily.    Dispense:  20 tablet    Refill:  0   cefTRIAXone (ROCEPHIN) injection 1 g    I,Zite Okoli,acting as a scribe for [employer], DO.,have documented all relevant documentation on the behalf of Ann Held, DO,as directed by  Ann Held, DO while in the presence of Ann Held, DO.   I, Ann Held, DO., personally preformed the services described in this documentation.  All medical record entries made by the scribe were at my direction and in my presence.  I have reviewed the chart and discharge instructions (if applicable) and agree that the record reflects my personal performance and is accurate and complete. 06/23/2021

## 2021-06-24 ENCOUNTER — Other Ambulatory Visit: Payer: Self-pay | Admitting: Internal Medicine

## 2021-06-24 LAB — CBC WITH DIFFERENTIAL/PLATELET
Basophils Absolute: 0 10*3/uL (ref 0.0–0.1)
Basophils Relative: 0.4 % (ref 0.0–3.0)
Eosinophils Absolute: 0.4 10*3/uL (ref 0.0–0.7)
Eosinophils Relative: 4.2 % (ref 0.0–5.0)
HCT: 38.4 % — ABNORMAL LOW (ref 39.0–52.0)
Hemoglobin: 12.5 g/dL — ABNORMAL LOW (ref 13.0–17.0)
Lymphocytes Relative: 7.5 % — ABNORMAL LOW (ref 12.0–46.0)
Lymphs Abs: 0.7 10*3/uL (ref 0.7–4.0)
MCHC: 32.7 g/dL (ref 30.0–36.0)
MCV: 82.1 fl (ref 78.0–100.0)
Monocytes Absolute: 0.9 10*3/uL (ref 0.1–1.0)
Monocytes Relative: 10 % (ref 3.0–12.0)
Neutro Abs: 7.3 10*3/uL (ref 1.4–7.7)
Neutrophils Relative %: 77.9 % — ABNORMAL HIGH (ref 43.0–77.0)
Platelets: 281 10*3/uL (ref 150.0–400.0)
RBC: 4.68 Mil/uL (ref 4.22–5.81)
RDW: 19.1 % — ABNORMAL HIGH (ref 11.5–15.5)
WBC: 9.4 10*3/uL (ref 4.0–10.5)

## 2021-06-24 LAB — COMPREHENSIVE METABOLIC PANEL
ALT: 5 U/L (ref 0–53)
AST: 11 U/L (ref 0–37)
Albumin: 4.2 g/dL (ref 3.5–5.2)
Alkaline Phosphatase: 98 U/L (ref 39–117)
BUN: 15 mg/dL (ref 6–23)
CO2: 30 mEq/L (ref 19–32)
Calcium: 8.9 mg/dL (ref 8.4–10.5)
Chloride: 93 mEq/L — ABNORMAL LOW (ref 96–112)
Creatinine, Ser: 1.48 mg/dL (ref 0.40–1.50)
GFR: 47.28 mL/min — ABNORMAL LOW (ref >60.00)
Glucose, Bld: 95 mg/dL (ref 70–99)
Potassium: 3.6 mEq/L (ref 3.5–5.1)
Sodium: 132 mEq/L — ABNORMAL LOW (ref 135–145)
Total Bilirubin: 0.8 mg/dL (ref 0.2–1.2)
Total Protein: 7.8 g/dL (ref 6.0–8.3)

## 2021-06-24 LAB — URIC ACID: Uric Acid, Serum: 7.8 mg/dL (ref 4.0–7.8)

## 2021-06-26 ENCOUNTER — Telehealth: Payer: Self-pay | Admitting: Internal Medicine

## 2021-06-26 ENCOUNTER — Ambulatory Visit (INDEPENDENT_AMBULATORY_CARE_PROVIDER_SITE_OTHER): Payer: Medicare Other | Admitting: Internal Medicine

## 2021-06-26 ENCOUNTER — Encounter: Payer: Self-pay | Admitting: Internal Medicine

## 2021-06-26 ENCOUNTER — Telehealth: Payer: Self-pay

## 2021-06-26 ENCOUNTER — Other Ambulatory Visit: Payer: Self-pay

## 2021-06-26 VITALS — BP 116/64 | HR 70 | Temp 98.0°F | Resp 20 | Ht 73.0 in | Wt 191.0 lb

## 2021-06-26 DIAGNOSIS — L0100 Impetigo, unspecified: Secondary | ICD-10-CM | POA: Diagnosis not present

## 2021-06-26 MED ORDER — HYDROCORTISONE 2.5 % EX CREA: TOPICAL_CREAM | Freq: Two times a day (BID) | CUTANEOUS | 0 refills | Status: DC

## 2021-06-26 MED ORDER — DICLOXACILLIN SODIUM 500 MG PO CAPS: 500.0000 mg | ORAL_CAPSULE | Freq: Four times a day (QID) | ORAL | 0 refills | Status: DC

## 2021-06-26 MED ORDER — MUPIROCIN CALCIUM 2 % EX CREA: 1.0000 "application " | TOPICAL_CREAM | Freq: Two times a day (BID) | CUTANEOUS | 0 refills | Status: DC

## 2021-06-26 NOTE — Patient Instructions (Signed)
Leg elevation Applying the 2 different creams twice daily until better Continue the antibiotic Dr. Etter Sjogren prescribed Also take dicloxacillin 4 times a day for 1 week.  If you are not gradually better let me know.  Impetigo, Adult Impetigo is an infection of the skin. It commonly occurs in young children, but it can also occur in adults. The infection causes itchy blisters and sores that produce brownish-yellow fluid. As the fluid dries, it forms a thick, honey-colored crust. These skin changes usually occur on the face, but they can also affect other areas of the body. Impetigo usually goes away in 7-10 days with treatment. What are the causes? This condition is caused by two types of bacteria. It may be caused by staphylococci or streptococci bacteria. These bacteria cause impetigo when they get under the surface of the skin. This often happens after some damage to the skin, such as: Cuts, scrapes, or scratches. Rashes. Insect bites, especially when you scratch the area of a bite. Chickenpox or other illnesses that cause open skin sores. Nail biting or chewing. Impetigo can spread easily from one person to another (is contagious). It may be spread through close skin contact or by sharing towels, clothing, or other items that an infected person has touched. Scratching the affected area can cause impetigo to spread to other parts of the body. The bacteria can get under your fingernails and spread when you touch another area of your skin. What increases the risk? The following factors may make you more likely to develop this condition: Playing sports that include skin-to-skin contact with others. Having broken skin, such as from a cut or scrape. Living in an area that has high humidity levels. Having poor hygiene. Having high levels of staphylococci in your nose. Having a condition that weakens the skin integrity, such as: Having a weak body defense system (immune system). Having a skin  condition with open sores, such as chickenpox. Having diabetes. What are the signs or symptoms? The main symptom of this condition is small blisters, often on the face around the mouth and nose. In time, the blisters break open and turn into tiny sores (lesions) with a yellow crust. In some cases, the blisters cause itching or burning. Scratching, irritation, or lack of treatment may cause these small lesions to get larger. Other possible symptoms include: Larger blisters. Pus. Swollen lymph glands. How is this diagnosed? This condition is usually diagnosed during a physical exam. A skin sample or a sample of fluid from a blister may be taken for lab tests that involve growing bacteria (culture test). Lab tests can help confirm the diagnosis or help determine the best treatment. How is this treated? Treatment for this condition depends on the severity of the condition: Mild impetigo can be treated with prescription antibiotic cream. Oral antibiotic medicine may be used in more severe cases. Medicines that reduce itchiness (antihistamines)may also be used. Follow these instructions at home: Medicines Take over-the-counter and prescription medicines only as told by your health care provider. Apply or take your antibiotic as told by your health care provider. Do not stop using the antibiotic even if your condition improves. Before applying antibiotic cream or ointment, you should: Gently wash the infected areas with antibacterial soap and warm water. Soak crusted areas in warm, soapy water using antibacterial soap. Gently rub the areas to remove crusts. Do not scrub. Preventing the spread of infection  To help prevent impetigo from spreading to other body areas: Keep your fingernails short and clean.  Do not scratch the blisters or sores. Cover infected areas, if necessary, to keep from scratching. Wash your hands often with soap and warm water. To help prevent impetigo from spreading to  other people: Do not share towels. Wash your clothing and bedsheets in water that is 140F (60C) or warmer. Stay home until you have used an antibiotic cream for 48 hours (2 days) or an oral antibiotic medicine for 24 hours (1 day). You should only return to work and activities with other people if your skin shows significant improvement. You may return to contact sports after you have used antibiotic medicine for 72 hours (3 days). General instructions Keep all follow-up visits. This is important. How is this prevented? Wash your hands often with soap and warm water. Do not share towels, washcloths, clothing, bedding, or razors. Keep your fingernails short. Keep any cuts, scrapes, bug bites, or rashes clean and covered. Use insect repellent to prevent bug bites. Contact a health care provider if: You develop more blisters or sores, even with treatment. Other family members get sores. Your skin sores are not improving after 72 hours (3 days) of treatment. You have a fever. Get help right away if: You see spreading redness or swelling of the skin around your sores. You develop a sore throat. The area around your rash becomes warm, red, or tender to the touch. You have dark, reddish-brown urine. You do not urinate often or you urinate small amounts. You are very tired (lethargic). You have swelling in your face, hands, or feet. Summary Impetigo is a skin infection that causes itchy blisters and sores that produce brownish-yellow fluid. As the fluid dries, it forms a crust. This condition is caused by staphylococci or streptococci bacteria. These bacteria cause impetigo when they get under the surface of the skin, such as through cuts, rashes, bug bites, or open sores. Treatment for this condition may include antibiotic ointment or oral antibiotics. To help prevent impetigo from spreading to other body areas, make sure you keep your fingernails short, avoid scratching, cover any  blisters, and wash your hands often. If you have impetigo, stay home until you have used an antibiotic cream for 48 hours (2 days) or an oral antibiotic medicine for 24 hours (1 day). You should only return to work and activities with other people if your skin shows significant improvement. This information is not intended to replace advice given to you by your health care provider. Make sure you discuss any questions you have with your health care provider. Document Revised: 12/27/2019 Document Reviewed: 12/27/2019 Elsevier Patient Education  [place].

## 2021-06-26 NOTE — Telephone Encounter (Signed)
Pt. Wife called and asked to talk regarding medication the was prescribed at today's appointment. She didn't specify why just that she needed a call back.   [phone]

## 2021-06-26 NOTE — Progress Notes (Addendum)
Subjective:    Patient ID: Sean Riley, male    DOB: 09/30/1950, 70 y.o.   MRN: [id number]  DOS:  06/26/2021 Type of visit - description: f/u  Pt reports a chronic irritation and occasional clear discharge from the left ankle.  Came to this office 3 days ago and saw one of my partners because he he developed  the clear discharge but the area was slightly swollen and warm. No fever or chills.     Dx with cellulitis of the left ankle. Got Rocephin 1 g, Rx Doxy.   Labs: White count normal, hemoglobin improving.  Chronic hyponatremia stable, otherwise CMP okay.  At this point area is crusted, he has developed some itching and a rash around it.  On chart review, ABIs in 02-2020 were normal.    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 LUTS    Hypertension    Microscopic hematuria    negative cystoscopy   NONSPECIFIC ABNORMAL ELECTROCARDIOGRAM    Pleural effusion on right    Pleural effusion, bilateral    S/P aortic dissection repair 03/24/2017   Biological Bentall aortic root replacement + resection and grafting of entire ascending aorta, transverse aortic arch and proximal descending thoracic aorta with elephant trunk distal anastomosis and debranching of aortic arch vessels   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   25 mm [facility] Ease bovine pericardial tissue valve and 28 mm Gelweave Valsalva aortic root graft with reimplantation of left main and right coronary arteries   Varicose veins     Past Surgical History:  Procedure Laterality Date   APPENDECTOMY     CARDIAC CATHETERIZATION      X3;Dr Caryl Comes, last 01/20/12: mild  non-obstructive CAD, normal LV systolic function   CARDIOVERSION N/A 05/31/2018   Procedure: CARDIOVERSION;  Surgeon: Sueanne Margarita, MD;  Location: [facility] ENDOSCOPY;  Service: Cardiovascular;  Laterality: N/A;   CARDIOVERSION N/A 06/13/2020   Procedure: CARDIOVERSION;  Surgeon: Larey Dresser, MD;  Location: [facility] ENDOSCOPY;  Service: Cardiovascular;  Laterality: N/A;   COLONOSCOPY     COLONOSCOPY N/A 01/27/2021   Procedure: COLONOSCOPY;  Surgeon: Gatha Mayer, MD;  Location: [facility] ENDOSCOPY;  Service: Endoscopy;  Laterality: N/A;   CYSTOSCOPY  1978   Dr Hartley Barefoot   ESOPHAGOGASTRODUODENOSCOPY N/A 01/27/2021   Procedure: ESOPHAGOGASTRODUODENOSCOPY (EGD);  Surgeon: Gatha Mayer, MD;  Location: [facility] ENDOSCOPY;  Service: Endoscopy;  Laterality: N/A;   EYE SURGERY     GIVENS CAPSULE STUDY N/A 01/27/2021   Procedure: GIVENS CAPSULE STUDY;  Surgeon: Gatha Mayer, MD;  Location: [place];  Service: Endoscopy;  Laterality: N/A;   IR THORACENTESIS ASP PLEURAL SPACE W/IMG GUIDE  11/25/2017   KNEE ARTHROSCOPY  2012   Dr Theda Sers   LUMBAR LAMINECTOMY  1987   Dr Lestine Box  2007   PERICARDIOCENTESIS N/A 04/07/2017   Procedure: PERICARDIOCENTESIS;  Surgeon: Sherren Mocha, MD;  Location: [place] CV LAB;  Service: Cardiovascular;  Laterality: N/A;   POLYPECTOMY     POLYPECTOMY  01/27/2021   Procedure: POLYPECTOMY;  Surgeon: Gatha Mayer, MD;  Location: [place] ENDOSCOPY;  Service: Endoscopy;;   PRESSURE SENSOR/CARDIOMEMS N/A 12/26/2020   Procedure: PRESSURE SENSOR/CARDIOMEMS;  Surgeon: Larey Dresser, MD;  Location: [place] CV LAB;  Service: Cardiovascular;  Laterality: N/A;   REPAIR OF ACUTE ASCENDING THORACIC AORTIC DISSECTION N/A 03/23/2017   Procedure: REPAIR OF ACUTE ASCENDING THORACIC AORTIC DISSECTION.  Bentall procedure.  Aortic root repleacement with bioprosthetic valve.  Reimplantation of left and right coronary arteries.  Total resection of transverse aortic arch.  Elephant  trunk distal anastomosis and debranching of arch vessels.;  Surgeon: Rexene Alberts, MD;  Location: [place];  Service: Vascular;  Laterality: N/A;   RIGHT HEART CATH N/A 06/15/2019   Procedure: RIGHT HEART CATH;  Surgeon: Larey Dresser, MD;  Location: [place] CV LAB;  Service: Cardiovascular;  Laterality: N/A;   ROTATOR CUFF REPAIR     SHOULDER ARTHROSCOPY  2011   Dr Theda Sers   SHOULDER ARTHROSCOPY Right 08/2015   SPINAL FUSION  1986   Dr Rolin Barry   TEE WITHOUT CARDIOVERSION N/A 03/23/2017   Procedure: TRANSESOPHAGEAL ECHOCARDIOGRAM (TEE);  Surgeon: Rexene Alberts, MD;  Location: [place];  Service: Open Heart Surgery;  Laterality: N/A;   THORACIC AORTIC ENDOVASCULAR STENT GRAFT N/A 03/30/2018   Procedure: THORACIC AORTIC ENDOVASCULAR STENT GRAFT WITH INTRAVASCULAR ULTRASOUND;  Surgeon: Serafina Mitchell, MD;  Location: [place];  Service: Vascular;  Laterality: N/A;   TRACHEOSTOMY     age 67 for croup    Allergies as of 06/26/2021       Reactions   Simvastatin Other (See Comments)   Mental status changes   Celecoxib Other (See Comments)   GI UPSET AND INFLAMMATION   Codeine Other (See Comments)   GI UPSET AND INFLAMMATION   Nsaids Other (See Comments)   GI UPSET AND INFLAMMATION (can tolerate via IV)   Tape Other (See Comments)   Medical tape and Band-Aids PULL OFF THE SKIN; please use Coban wrap   Cephalexin Itching, Rash   Latex Rash        Medication List        Accurate as of June 26, 2021 11:59 PM. If you have any questions, ask your nurse or doctor.          STOP taking these medications    Moderna COVID-19 Vaccine 100 MCG/0.5ML injection Generic drug: COVID-19 mRNA vaccine (Moderna) Stopped by: Kathlene November, MD       TAKE these medications    albuterol 108 (90 Base) MCG/ACT inhaler Commonly known as: ProAir HFA Inhale 2 puffs into the lungs every 4 (four) hours as needed for wheezing or shortness of breath.   ALPRAZolam 1 MG tablet Commonly known as:  XANAX TAKE 1 TABLET BY MOUTH AT BEDTIME.   amoxicillin 500 MG capsule Commonly known as: AMOXIL Take 1 capsule (500 mg total) by mouth 3 (three) times daily. Started by: Bishop Dublin, CMA   augmented betamethasone dipropionate 0.05 % cream Commonly known as: DIPROLENE-AF Apply topically 2 (two) times daily as needed.   bisacodyl 5 MG EC tablet Commonly known as: DULCOLAX Take 5 mg by mouth daily as needed for moderate constipation.   Breztri Aerosphere 160-9-4.8 MCG/ACT Aero Generic drug: Budeson-Glycopyrrol-Formoterol Inhale 2 puffs into the lungs in the morning and at bedtime.   diphenhydrAMINE 25 MG tablet Commonly known as: BENADRYL Take 25 mg by mouth daily as needed (allergies/runny nose).   doxycycline 100 MG tablet Commonly known as: VIBRA-TABS Take 1 tablet (100 mg total) by mouth  2 (two) times daily.   Eliquis 5 MG Tabs tablet Generic drug: apixaban Take 1 tablet (5 mg total) by mouth 2 (two) times daily for 1 day.   escitalopram 10 MG tablet Commonly known as: LEXAPRO TAKE 1 TABLET DAILY   esomeprazole 40 MG capsule Commonly known as: NexIUM Take 1 capsule (40 mg total) by mouth daily before breakfast.   flecainide 50 MG tablet Commonly known as: TAMBOCOR TAKE ONE AND ONE-HALF TABLETS TWICE A DAY   fluticasone 50 MCG/ACT nasal spray Commonly known as: FLONASE Place 1 spray into both nostrils 2 (two) times daily as needed for allergies. Use one spray in each nostril twice daily.   hydrocortisone 2.5 % cream Apply topically 2 (two) times daily. Started by: Kathlene November, MD   hydroxypropyl methylcellulose / hypromellose 2.5 % ophthalmic solution Commonly known as: ISOPTO TEARS / GONIOVISC Place 1-2 drops into both eyes 3 (three) times daily as needed (dry/irritated eyes).   ipratropium-albuterol 0.5-2.5 (3) MG/3ML Soln Commonly known as: DUONEB Take 3 mLs by nebulization every 4 (four) hours as needed.   Jardiance 10 MG Tabs tablet Generic drug:  empagliflozin TAKE 1 TABLET DAILY BEFORE BREAKFAST   magnesium hydroxide 400 MG/5ML suspension Commonly known as: MILK OF MAGNESIA Take 7.5 mLs by mouth daily as needed (stomach cramps/constipation).   metoprolol tartrate 25 MG tablet Commonly known as: LOPRESSOR Take 0.5 tablets (12.5 mg total) by mouth 2 (two) times daily. Please make yearly appt with Dr. Caryl Comes for January 2022 for future refills. Thank you 1st attempt   mupirocin cream 2 % Commonly known as: Bactroban Apply 1 application topically 2 (two) times daily. Started by: Kathlene November, MD   OXYGEN Inhale 3-4 L/min into the lungs continuous.   potassium chloride SA 20 MEQ tablet Commonly known as: Klor-Con M20 Take 2 tablets (40 mEq total) by mouth every morning AND 1 tablet (20 mEq total) every evening.   rosuvastatin 5 MG tablet Commonly known as: CRESTOR TAKE 1 TABLET DAILY   torsemide 20 MG tablet Commonly known as: DEMADEX Take 4 tablets (80 mg total) by mouth daily.           Objective:   Physical Exam BP 116/64 (BP Location: Left Arm, Patient Position: Sitting, Cuff Size: Small)   Pulse 70   Temp 98 F (36.7 C) (Oral)   Resp 20   Ht 6\' 1"  (1.854 m)   Wt 191 lb (86.6 kg)   SpO2 91% Comment: 4L/min  BMI 25.20 kg/m  General:   Well developed, NAD, BMI noted. HEENT:  Normocephalic . Face symmetric, atraumatic Lower extremities:  Pedal pulses bilaterally present. Capillary refill is okay bilaterally. See pictures: Crusted area at the left malleolus surrounded by erythematous skin, eczema? skin: Not pale. Not jaundice Neurologic:  alert & oriented X3.  Speech normal, gait appropriate for age and unassisted Psych--  Cognition and judgment appear intact.  Cooperative with normal attention span and concentration.  Behavior appropriate. No anxious or depressed appearing.         Assessment     Assessment  HTN Hyperlipidemia GERD Insomnia: xanax prn MSK: See surgeries Pulmonary  --COPD,  s/p  PFTs 12/2013; 2020: Moderate to severe - Home O2  --Smoker, quit 03/2017 (2PPD) -- OSA , on Cpap Allergies-- Dr Neldon Mc  CV: --P- Atrial fibrillation Dr Caryl Comes, on NSRb --Cardiac catheterization July 2013:  mild nonobstructive CAD   --thoracis Ao  dissection , acute, surgery 03-2017 --Aortic thoracic aneurysm endovascular stent graft 03-2018 GU:  --  BPH, microscopic hematuria (negative cystoscopy 1978) --Elevated PSA: saw urology  04-2016 ENDO Hypogonadism-- dx per Dr Loanne Drilling, numbers improved d/t nolvadex  Gynecomastia: ---Dr. Loanne Drilling  >> Bx (-) 10-2014 ; took tamoxifen temporarily (self d/c 06-2016) ---Again 09/2019, labs/MMG done, on [place] agai Dermatology : eczema? rx taclonex H/o  Tracheostomy , age 48, croup Chronic anemia:  Chronic anemia per chart review,+ iron deficient (08-2018). S/p  colonoscopy 05-2016, multiple polyps. EGD 2014 for chest pain: Negative, bx results? Admitted 01/2021: Hemoglobin of 5.  Work-up negative.  PLAN: Impetigo: Symptoms a started approximately 10 days ago, see HPI. Was seen here by my partner, Rx Rocephin and doxycycline.  Area has not improved much and the surrounding  skin is now itchy. On exam he has impetigo characterized by a golden crust surrounded by what seems to be eczema. Plan: Continue Doxy, add penicillin (able to tolerate penicillin despite Keflex allergy), mupirocin, topical steroids, call if no better, see instructions.    This visit occurred during the SARS-CoV-2 public health emergency.  Safety protocols were in place, including screening questions prior to the visit, additional usage of staff PPE, and extensive cleaning of exam room while observing appropriate contact time as indicated for disinfecting solutions.

## 2021-06-26 NOTE — Telephone Encounter (Signed)
Received fax from [facility]- dicloxacillin is no longer available.Please send alternative medicine. Thank you.

## 2021-06-26 NOTE — Telephone Encounter (Signed)
Disregard- PA approved.   [id number];Review Type:Prior Auth;Coverage Start Date:05/27/2021;Coverage End Date:06/26/2022;

## 2021-06-26 NOTE — Telephone Encounter (Signed)
Mupirocin Calcium 2% cream is not covered by insurance- I'm trying to do a PA but his insurance plan is not responding on covermymeds. Can you recommend another cream?

## 2021-06-27 ENCOUNTER — Other Ambulatory Visit: Payer: Self-pay

## 2021-06-27 MED ORDER — AMOXICILLIN 500 MG PO CAPS: 500.0000 mg | ORAL_CAPSULE | Freq: Three times a day (TID) | ORAL | 0 refills | Status: DC

## 2021-06-27 NOTE — Assessment & Plan Note (Addendum)
Impetigo: Symptoms a started approximately 10 days ago, see HPI. Was seen here by my partner, Rx Rocephin and doxycycline.  Area has not improved much and the surrounding  skin is now itchy. On exam he has impetigo characterized by a golden crust surrounded by what seems to be eczema. Plan: Continue Doxy, add penicillin (able to tolerate penicillin despite Keflex allergy), mupirocin, topical steroids, call if no better, see instructions.

## 2021-06-27 NOTE — Telephone Encounter (Signed)
Switch to amoxicillin 500 mg 3 times daily, #21, no refills

## 2021-06-27 NOTE — Telephone Encounter (Signed)
Rx sent 

## 2021-07-08 ENCOUNTER — Telehealth: Payer: Self-pay | Admitting: Internal Medicine

## 2021-07-08 NOTE — Telephone Encounter (Signed)
Patient's wife called to make an appointment since her husband's ankles are still not doing ok. She was informed that Dr. Larose Kells was booked until Monday. Due a to time modifier for scheduling, an appointment was not available until Thursday 12/08. She was offered an appointment with another provider but declined. She was informed that he would be added to a wait list and a message sent to Dr. Larose Kells. Please advice.

## 2021-07-08 NOTE — Telephone Encounter (Signed)
Pt declined another provider, PCP doesn't have any sooner appts- unfortunately he will have to wait.

## 2021-07-10 ENCOUNTER — Telehealth: Payer: Self-pay | Admitting: Internal Medicine

## 2021-07-10 NOTE — Telephone Encounter (Signed)
Sean Riley, emg screening called regarding recent fax that was sent over on 11/23 and resent on 11/28. Joellen Jersey would like for someone to contact [phone] to advised it fax was received. Please advise.

## 2021-07-10 NOTE — Telephone Encounter (Signed)
Spoke w/ EMG screening informed that form was received twice and I shred forms- informed that if Pt is truly interested in genetic testing he will reach out to [name] for [name] to use our genetic counselors here.

## 2021-07-11 DIAGNOSIS — L299 Pruritus, unspecified: Secondary | ICD-10-CM | POA: Diagnosis not present

## 2021-07-11 DIAGNOSIS — I87332 Chronic venous hypertension (idiopathic) with ulcer and inflammation of left lower extremity: Secondary | ICD-10-CM | POA: Diagnosis not present

## 2021-07-11 DIAGNOSIS — L089 Local infection of the skin and subcutaneous tissue, unspecified: Secondary | ICD-10-CM | POA: Diagnosis not present

## 2021-07-15 ENCOUNTER — Other Ambulatory Visit: Payer: Self-pay | Admitting: Internal Medicine

## 2021-07-17 ENCOUNTER — Ambulatory Visit (INDEPENDENT_AMBULATORY_CARE_PROVIDER_SITE_OTHER): Payer: Medicare Other | Admitting: Internal Medicine

## 2021-07-17 ENCOUNTER — Encounter: Payer: Self-pay | Admitting: Internal Medicine

## 2021-07-17 VITALS — BP 122/68 | HR 68 | Temp 97.4°F | Resp 20 | Ht 73.0 in | Wt 190.5 lb

## 2021-07-17 DIAGNOSIS — L0100 Impetigo, unspecified: Secondary | ICD-10-CM

## 2021-07-17 MED ORDER — TRIAMCINOLONE ACETONIDE 0.5 % EX CREA: 1.0000 "application " | TOPICAL_CREAM | Freq: Three times a day (TID) | CUTANEOUS | Status: AC

## 2021-07-17 NOTE — Patient Instructions (Signed)
Take the antibiotics they gave you at the urgent care until they are finished.  Continue applying the cream they gave you twice daily until better  Use moisturizing over-the-counter cream or lotion at the area.

## 2021-07-17 NOTE — Progress Notes (Signed)
Subjective:    Patient ID: Sean Riley, male    DOB: 02-Jan-1951, 70 y.o.   MRN: [id number]  DOS:  07/17/2021 Type of visit - description: Acute  Symptoms a started 05/17/2019. Diagnosis was impetigo eczema. He was on doxycycline, was seen by  me 06/26/2021, penicillin was added, also Rx, mupirocin and topical steroid  Martin Majestic to a urgent care 07/11/2021 b/c he was not completely better after he finished antibiotic I prescribed. Was recommended clindamycin, Augmentin and topical triamcinolone.  They are here for a checkup. Overall area looks better. He developed some heartburn when she was taking the new antibiotics so he stopped them.  Denies any fever chills No nausea, vomiting or diarrhea.     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 LUTS    Hypertension    Microscopic hematuria    negative cystoscopy   NONSPECIFIC ABNORMAL ELECTROCARDIOGRAM    Pleural effusion on right    Pleural effusion, bilateral    S/P aortic dissection repair 03/24/2017   Biological Bentall aortic root replacement + resection and grafting of entire ascending aorta, transverse aortic arch and proximal descending thoracic aorta with elephant trunk distal anastomosis and debranching of aortic arch vessels   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   25 mm [facility] Ease bovine pericardial tissue valve and 28 mm Gelweave Valsalva aortic root graft with reimplantation of left main and right coronary arteries   Varicose veins     Past Surgical History:  Procedure Laterality Date   APPENDECTOMY     CARDIAC CATHETERIZATION      X3;Dr Caryl Comes, last 01/20/12: mild  non-obstructive CAD, normal LV systolic function   CARDIOVERSION N/A 05/31/2018   Procedure: CARDIOVERSION;  Surgeon: Sueanne Margarita, MD;  Location[facility] ENDOSCOPY;  Service: Cardiovascular;  Laterality: N/A;   CARDIOVERSION N/A 06/13/2020   Procedure: CARDIOVERSION;  Surgeon: Larey Dresser, MD;  Location: [facility] ENDOSCOPY;  Service: Cardiovascular;  Laterality: N/A;   COLONOSCOPY     COLONOSCOPY N/A 01/27/2021   Procedure: COLONOSCOPY;  Surgeon: Gatha Mayer, MD;  Location: [facility] ENDOSCOPY;  Service: Endoscopy;  Laterality: N/A;   CYSTOSCOPY  1978   Dr Hartley Barefoot   ESOPHAGOGASTRODUODENOSCOPY N/A 01/27/2021   Procedure: ESOPHAGOGASTRODUODENOSCOPY (EGD);  Surgeon: Gatha Mayer, MD;  Location: [facility] ENDOSCOPY;  Service: Endoscopy;  Laterality: N/A;   EYE SURGERY     GIVENS CAPSULE STUDY N/A 01/27/2021   Procedure: GIVENS CAPSULE STUDY;  Surgeon: Gatha Mayer, MD;  Location: [place];  Service: Endoscopy;  Laterality: N/A;   IR THORACENTESIS ASP PLEURAL SPACE W/IMG GUIDE  11/25/2017   KNEE ARTHROSCOPY  2012   Dr Theda Sers   LUMBAR LAMINECTOMY  1987   Dr Lestine Box  2007   PERICARDIOCENTESIS N/A 04/07/2017   Procedure: PERICARDIOCENTESIS;  Surgeon: Sherren Mocha, MD;  Location: [place] CV LAB;  Service: Cardiovascular;  Laterality: N/A;   POLYPECTOMY     POLYPECTOMY  01/27/2021   Procedure: POLYPECTOMY;  Surgeon: Gatha Mayer, MD;  Location: [place];  Service: Endoscopy;;   PRESSURE SENSOR/CARDIOMEMS N/A 12/26/2020  Procedure: PRESSURE SENSOR/CARDIOMEMS;  Surgeon: Larey Dresser, MD;  Location: [place] CV LAB;  Service: Cardiovascular;  Laterality: N/A;   REPAIR OF ACUTE ASCENDING THORACIC AORTIC DISSECTION N/A 03/23/2017   Procedure: REPAIR OF ACUTE ASCENDING THORACIC AORTIC DISSECTION.  Bentall procedure.  Aortic root repleacement with bioprosthetic valve.  Reimplantation of left and right coronary arteries.  Total resection of transverse aortic arch.  Elephant  trunk distal anastomosis and debranching of arch vessels.;  Surgeon: Rexene Alberts, MD;  Location: [place];  Service: Vascular;  Laterality: N/A;   RIGHT HEART CATH N/A 06/15/2019   Procedure: RIGHT HEART CATH;  Surgeon: Larey Dresser, MD;  Location: [place] CV LAB;  Service: Cardiovascular;  Laterality: N/A;   ROTATOR CUFF REPAIR     SHOULDER ARTHROSCOPY  2011   Dr Theda Sers   SHOULDER ARTHROSCOPY Right 08/2015   SPINAL FUSION  1986   Dr Rolin Barry   TEE WITHOUT CARDIOVERSION N/A 03/23/2017   Procedure: TRANSESOPHAGEAL ECHOCARDIOGRAM (TEE);  Surgeon: Rexene Alberts, MD;  Location: [place];  Service: Open Heart Surgery;  Laterality: N/A;   THORACIC AORTIC ENDOVASCULAR STENT GRAFT N/A 03/30/2018   Procedure: THORACIC AORTIC ENDOVASCULAR STENT GRAFT WITH INTRAVASCULAR ULTRASOUND;  Surgeon: Serafina Mitchell, MD;  Location: [place];  Service: Vascular;  Laterality: N/A;   TRACHEOSTOMY     age 53 for croup    Allergies as of 07/17/2021       Reactions   Simvastatin Other (See Comments)   Mental status changes   Celecoxib Other (See Comments)   GI UPSET AND INFLAMMATION   Codeine Other (See Comments)   GI UPSET AND INFLAMMATION   Nsaids Other (See Comments)   GI UPSET AND INFLAMMATION (can tolerate via IV)   Tape Other (See Comments)   Medical tape and Band-Aids PULL OFF THE SKIN; please use Coban wrap   Cephalexin Itching, Rash   Latex Rash        Medication List        Accurate as of July 17, 2021 11:59 PM. If you have any questions, ask your nurse or doctor.          STOP taking these medications    amoxicillin 500 MG capsule Commonly known as: AMOXIL Stopped by: Kathlene November, MD   doxycycline 100 MG tablet Commonly known as: VIBRA-TABS Stopped by: Kathlene November, MD   mupirocin cream 2 % Commonly known as: Bactroban Stopped by: Kathlene November, MD       TAKE these medications    albuterol 108 (90 Base) MCG/ACT inhaler Commonly known as: ProAir HFA Inhale 2 puffs into  the lungs every 4 (four) hours as needed for wheezing or shortness of breath.   ALPRAZolam 1 MG tablet Commonly known as: XANAX TAKE 1 TABLET BY MOUTH AT BEDTIME.   augmented betamethasone dipropionate 0.05 % cream Commonly known as: DIPROLENE-AF APPLY TOPICALLY 2 TIMES DAILY AS NEEDED.   bisacodyl 5 MG EC tablet Commonly known as: DULCOLAX Take 5 mg by mouth daily as needed for moderate constipation.   Breztri Aerosphere 160-9-4.8 MCG/ACT Aero Generic drug: Budeson-Glycopyrrol-Formoterol Inhale 2 puffs into the lungs in the morning and at bedtime.   diphenhydrAMINE 25 MG tablet Commonly known as: BENADRYL Take 25 mg by mouth daily as needed (allergies/runny nose).   Eliquis 5 MG Tabs tablet Generic drug: apixaban Take 1 tablet (5 mg total) by mouth 2 (two) times daily for 1 day.   escitalopram 10 MG tablet Commonly known as: LEXAPRO  TAKE 1 TABLET DAILY   esomeprazole 40 MG capsule Commonly known as: NexIUM Take 1 capsule (40 mg total) by mouth daily before breakfast.   flecainide 50 MG tablet Commonly known as: TAMBOCOR TAKE ONE AND ONE-HALF TABLETS TWICE A DAY   fluticasone 50 MCG/ACT nasal spray Commonly known as: FLONASE Place 1 spray into both nostrils 2 (two) times daily as needed for allergies. Use one spray in each nostril twice daily.   hydrocortisone 2.5 % cream Apply topically 2 (two) times daily.   hydroxypropyl methylcellulose / hypromellose 2.5 % ophthalmic solution Commonly known as: ISOPTO TEARS / GONIOVISC Place 1-2 drops into both eyes 3 (three) times daily as needed (dry/irritated eyes).   ipratropium-albuterol 0.5-2.5 (3) MG/3ML Soln Commonly known as: DUONEB Take 3 mLs by nebulization every 4 (four) hours as needed.   Jardiance 10 MG Tabs tablet Generic drug: empagliflozin TAKE 1 TABLET DAILY BEFORE BREAKFAST   magnesium hydroxide 400 MG/5ML suspension Commonly known as: MILK OF MAGNESIA Take 7.5 mLs by mouth daily as needed (stomach  cramps/constipation).   metoprolol tartrate 25 MG tablet Commonly known as: LOPRESSOR Take 0.5 tablets (12.5 mg total) by mouth 2 (two) times daily. Please make yearly appt with Dr. Caryl Comes for January 2022 for future refills. Thank you 1st attempt   OXYGEN Inhale 3-4 L/min into the lungs continuous.   potassium chloride SA 20 MEQ tablet Commonly known as: Klor-Con M20 Take 2 tablets (40 mEq total) by mouth every morning AND 1 tablet (20 mEq total) every evening.   rosuvastatin 5 MG tablet Commonly known as: CRESTOR TAKE 1 TABLET DAILY   torsemide 20 MG tablet Commonly known as: DEMADEX Take 4 tablets (80 mg total) by mouth daily.   triamcinolone cream 0.5 % Commonly known as: KENALOG Apply 1 application topically 3 (three) times daily. Started by: Kathlene November, MD           Objective:   Physical Exam BP 122/68 (BP Location: Left Arm, Patient Position: Sitting, Cuff Size: Small)   Pulse 68   Temp (!) 97.4 F (36.3 C) (Oral)   Resp 20   Ht 6\' 1"  (1.854 m)   Wt 190 lb 8 oz (86.4 kg)   SpO2 91% Comment: 4L  BMI 25.13 kg/m  General:   Well developed, NAD, BMI noted. HEENT:  Normocephalic . Face symmetric, atraumatic Lower extremities: Area of prior impetigo is now flat, not warm, not tender.  Still has a rash surrounding the impetigo area consistent with eczema. Good pedal pulses. Skin: Also has a similar eczema-like rash at the abdomen in a symmetric fashion bilaterally.  See pictures. Neurologic:  alert & oriented X3.  Speech normal  Psych--  Cognition and judgment appear intact.  Cooperative with normal attention span and concentration.  Behavior appropriate. No anxious or depressed appearing.     Rash at both sides of the lower abdomen:      Assessment      Assessment  HTN Hyperlipidemia GERD Insomnia: xanax prn MSK: See surgeries Pulmonary  --COPD, s/p  PFTs 12/2013; 2020: Moderate to severe - Home O2  --Smoker, quit 03/2017 (2PPD) -- OSA , on  Cpap Allergies-- Dr Neldon Mc  CV: --P- Atrial fibrillation Dr Caryl Comes, on NSRb --Cardiac catheterization July 2013:  mild nonobstructive CAD   --thoracis Ao  dissection , acute, surgery 03-2017 --Aortic thoracic aneurysm endovascular stent graft 03-2018 GU:  --BPH, microscopic hematuria (negative cystoscopy 1978) --Elevated PSA: saw urology  04-2016 ENDO Hypogonadism-- dx per Dr Loanne Drilling, numbers  improved d/t nolvadex  Gynecomastia: ---Dr. Loanne Drilling  >> Bx (-) 10-2014 ; took tamoxifen temporarily (self d/c 06-2016) ---Again 09/2019, labs/MMG done, on [place] agai Dermatology : eczema? rx taclonex H/o  Tracheostomy , age 83, croup Chronic anemia:  Chronic anemia per chart review,+ iron deficient (08-2018). S/p  colonoscopy 05-2016, multiple polyps. EGD 2014 for chest pain: Negative, bx results? Admitted 01/2021: Hemoglobin of 5.  Work-up negative.  PLAN: Impetigo: Was seen here with impetigo and prescribed antibiotics, was not completely better, was reassessed at a UC 07/11/2021, prescribed triamcinolone cream, clindamycin and Augmentin. On physical exam today impetigo is almost gone.  He also has the  surrounding rash that I suspect is eczema;  similar rash at the abdomen. Plan: Finish clindamycin and Augmentin as well as the triamcinolone cream prescribed at the urgent care. Okay to use triamcinolone at the abdomen as well. Keep the area moisturized If not better let me know.   (Rash on the abdominal legs seems to be eczema however scabies is in the ddx although his wife does not have any rash.  Consider empiric treatment).   This visit occurred during the SARS-CoV-2 public health emergency.  Safety protocols were in place, including screening questions prior to the visit, additional usage of staff PPE, and extensive cleaning of exam room while observing appropriate contact time as indicated for disinfecting solutions.

## 2021-07-18 NOTE — Assessment & Plan Note (Signed)
Impetigo: Was seen here with impetigo and prescribed antibiotics, was not completely better, was reassessed at a UC 07/11/2021, prescribed triamcinolone cream, clindamycin and Augmentin. On physical exam today impetigo is almost gone.  He also has the  surrounding rash that I suspect is eczema;  similar rash at the abdomen. Plan: Finish clindamycin and Augmentin as well as the triamcinolone cream prescribed at the urgent care. Okay to use triamcinolone at the abdomen as well. Keep the area moisturized If not better let me know.   (Rash on the abdominal legs seems to be eczema however scabies is in the ddx although his wife does not have any rash.  Consider empiric treatment).

## 2021-08-08 ENCOUNTER — Telehealth: Payer: Self-pay | Admitting: Internal Medicine

## 2021-08-08 NOTE — Telephone Encounter (Signed)
I spoke to patient's wife, [name], and she will call back and schedule Medicare Annual Wellness Visit (AWV) in office.   If not able to come in office, please offer to do virtually or by telephone.  Left office number and my jabber [phone].  Last AWV:11/24/2019  Please schedule at anytime with Nurse Health Advisor.

## 2021-08-12 ENCOUNTER — Ambulatory Visit (HOSPITAL_COMMUNITY)
Admission: RE | Admit: 2021-08-12 | Discharge: 2021-08-12 | Disposition: A | Discharge status: Home / Self Care | Payer: Medicare Other | Source: Ambulatory Visit | Attending: Adult Health | Admitting: Adult Health

## 2021-08-12 ENCOUNTER — Other Ambulatory Visit: Payer: Self-pay

## 2021-08-12 DIAGNOSIS — I5032 Chronic diastolic (congestive) heart failure: Secondary | ICD-10-CM | POA: Diagnosis not present

## 2021-08-12 NOTE — Progress Notes (Signed)
Cardiomems Remote Monitoring.   S/P Cardiomems Implant 12/26/2020   I have reviewed weekly PAD readings from cardiomems device.   PAD Goal 16. Current PAD reading 19.   Continue current diuretic regimen.   Follow weekly readings.   Jackie Littlejohn NP-C  4:02 PM

## 2021-08-15 ENCOUNTER — Emergency Department (HOSPITAL_COMMUNITY): Payer: Medicare Other

## 2021-08-15 ENCOUNTER — Encounter (HOSPITAL_COMMUNITY): Payer: Self-pay

## 2021-08-15 ENCOUNTER — Inpatient Hospital Stay (HOSPITAL_COMMUNITY)
Admission: EM | Admit: 2021-08-15 | Discharge: 2021-08-22 | DRG: 871 | Disposition: A | Discharge status: Home / Self Care | Payer: Medicare Other | Attending: Internal Medicine | Admitting: Internal Medicine

## 2021-08-15 DIAGNOSIS — J9622 Acute and chronic respiratory failure with hypercapnia: Secondary | ICD-10-CM | POA: Diagnosis present

## 2021-08-15 DIAGNOSIS — I5033 Acute on chronic diastolic (congestive) heart failure: Secondary | ICD-10-CM | POA: Diagnosis not present

## 2021-08-15 DIAGNOSIS — G9341 Metabolic encephalopathy: Secondary | ICD-10-CM | POA: Diagnosis not present

## 2021-08-15 DIAGNOSIS — I38 Endocarditis, valve unspecified: Secondary | ICD-10-CM | POA: Diagnosis not present

## 2021-08-15 DIAGNOSIS — Z888 Allergy status to other drugs, medicaments and biological substances status: Secondary | ICD-10-CM

## 2021-08-15 DIAGNOSIS — R410 Disorientation, unspecified: Secondary | ICD-10-CM | POA: Diagnosis not present

## 2021-08-15 DIAGNOSIS — K5730 Diverticulosis of large intestine without perforation or abscess without bleeding: Secondary | ICD-10-CM | POA: Diagnosis not present

## 2021-08-15 DIAGNOSIS — K8020 Calculus of gallbladder without cholecystitis without obstruction: Secondary | ICD-10-CM | POA: Diagnosis not present

## 2021-08-15 DIAGNOSIS — J811 Chronic pulmonary edema: Secondary | ICD-10-CM

## 2021-08-15 DIAGNOSIS — A419 Sepsis, unspecified organism: Secondary | ICD-10-CM

## 2021-08-15 DIAGNOSIS — Z20822 Contact with and (suspected) exposure to covid-19: Secondary | ICD-10-CM | POA: Diagnosis present

## 2021-08-15 DIAGNOSIS — L89311 Pressure ulcer of right buttock, stage 1: Secondary | ICD-10-CM | POA: Diagnosis present

## 2021-08-15 DIAGNOSIS — I4891 Unspecified atrial fibrillation: Secondary | ICD-10-CM | POA: Diagnosis not present

## 2021-08-15 DIAGNOSIS — A4181 Sepsis due to Enterococcus: Secondary | ICD-10-CM | POA: Diagnosis not present

## 2021-08-15 DIAGNOSIS — R21 Rash and other nonspecific skin eruption: Secondary | ICD-10-CM | POA: Diagnosis not present

## 2021-08-15 DIAGNOSIS — Z91048 Other nonmedicinal substance allergy status: Secondary | ICD-10-CM

## 2021-08-15 DIAGNOSIS — Z7901 Long term (current) use of anticoagulants: Secondary | ICD-10-CM

## 2021-08-15 DIAGNOSIS — J440 Chronic obstructive pulmonary disease with acute lower respiratory infection: Secondary | ICD-10-CM | POA: Diagnosis present

## 2021-08-15 DIAGNOSIS — Z7984 Long term (current) use of oral hypoglycemic drugs: Secondary | ICD-10-CM

## 2021-08-15 DIAGNOSIS — Z886 Allergy status to analgesic agent status: Secondary | ICD-10-CM

## 2021-08-15 DIAGNOSIS — I083 Combined rheumatic disorders of mitral, aortic and tricuspid valves: Secondary | ICD-10-CM | POA: Diagnosis not present

## 2021-08-15 DIAGNOSIS — R5381 Other malaise: Secondary | ICD-10-CM | POA: Diagnosis present

## 2021-08-15 DIAGNOSIS — L89321 Pressure ulcer of left buttock, stage 1: Secondary | ICD-10-CM | POA: Diagnosis not present

## 2021-08-15 DIAGNOSIS — Z91040 Latex allergy status: Secondary | ICD-10-CM

## 2021-08-15 DIAGNOSIS — I7123 Aneurysm of the descending thoracic aorta, without rupture: Secondary | ICD-10-CM | POA: Diagnosis not present

## 2021-08-15 DIAGNOSIS — I499 Cardiac arrhythmia, unspecified: Secondary | ICD-10-CM | POA: Diagnosis not present

## 2021-08-15 DIAGNOSIS — I517 Cardiomegaly: Secondary | ICD-10-CM | POA: Diagnosis not present

## 2021-08-15 DIAGNOSIS — Z885 Allergy status to narcotic agent status: Secondary | ICD-10-CM

## 2021-08-15 DIAGNOSIS — K219 Gastro-esophageal reflux disease without esophagitis: Secondary | ICD-10-CM | POA: Diagnosis present

## 2021-08-15 DIAGNOSIS — N179 Acute kidney failure, unspecified: Secondary | ICD-10-CM | POA: Diagnosis not present

## 2021-08-15 DIAGNOSIS — R059 Cough, unspecified: Secondary | ICD-10-CM | POA: Diagnosis not present

## 2021-08-15 DIAGNOSIS — Z9981 Dependence on supplemental oxygen: Secondary | ICD-10-CM | POA: Diagnosis not present

## 2021-08-15 DIAGNOSIS — L8990 Pressure ulcer of unspecified site, unspecified stage: Secondary | ICD-10-CM | POA: Insufficient documentation

## 2021-08-15 DIAGNOSIS — I130 Hypertensive heart and chronic kidney disease with heart failure and stage 1 through stage 4 chronic kidney disease, or unspecified chronic kidney disease: Secondary | ICD-10-CM | POA: Diagnosis present

## 2021-08-15 DIAGNOSIS — Z952 Presence of prosthetic heart valve: Secondary | ICD-10-CM | POA: Diagnosis not present

## 2021-08-15 DIAGNOSIS — D631 Anemia in chronic kidney disease: Secondary | ICD-10-CM | POA: Diagnosis present

## 2021-08-15 DIAGNOSIS — R7881 Bacteremia: Secondary | ICD-10-CM | POA: Diagnosis not present

## 2021-08-15 DIAGNOSIS — T826XXA Infection and inflammatory reaction due to cardiac valve prosthesis, initial encounter: Secondary | ICD-10-CM

## 2021-08-15 DIAGNOSIS — B952 Enterococcus as the cause of diseases classified elsewhere: Secondary | ICD-10-CM | POA: Diagnosis not present

## 2021-08-15 DIAGNOSIS — I480 Paroxysmal atrial fibrillation: Secondary | ICD-10-CM | POA: Diagnosis present

## 2021-08-15 DIAGNOSIS — J441 Chronic obstructive pulmonary disease with (acute) exacerbation: Secondary | ICD-10-CM | POA: Diagnosis present

## 2021-08-15 DIAGNOSIS — R6520 Severe sepsis without septic shock: Secondary | ICD-10-CM | POA: Diagnosis not present

## 2021-08-15 DIAGNOSIS — J439 Emphysema, unspecified: Secondary | ICD-10-CM | POA: Diagnosis not present

## 2021-08-15 DIAGNOSIS — J9601 Acute respiratory failure with hypoxia: Secondary | ICD-10-CM | POA: Diagnosis not present

## 2021-08-15 DIAGNOSIS — R509 Fever, unspecified: Secondary | ICD-10-CM | POA: Diagnosis not present

## 2021-08-15 DIAGNOSIS — I4811 Longstanding persistent atrial fibrillation: Secondary | ICD-10-CM | POA: Diagnosis not present

## 2021-08-15 DIAGNOSIS — E785 Hyperlipidemia, unspecified: Secondary | ICD-10-CM | POA: Diagnosis present

## 2021-08-15 DIAGNOSIS — R6521 Severe sepsis with septic shock: Secondary | ICD-10-CM | POA: Diagnosis present

## 2021-08-15 DIAGNOSIS — I361 Nonrheumatic tricuspid (valve) insufficiency: Secondary | ICD-10-CM | POA: Diagnosis not present

## 2021-08-15 DIAGNOSIS — Z743 Need for continuous supervision: Secondary | ICD-10-CM | POA: Diagnosis not present

## 2021-08-15 DIAGNOSIS — B351 Tinea unguium: Secondary | ICD-10-CM | POA: Diagnosis present

## 2021-08-15 DIAGNOSIS — J189 Pneumonia, unspecified organism: Secondary | ICD-10-CM | POA: Diagnosis not present

## 2021-08-15 DIAGNOSIS — Z981 Arthrodesis status: Secondary | ICD-10-CM

## 2021-08-15 DIAGNOSIS — E871 Hypo-osmolality and hyponatremia: Secondary | ICD-10-CM | POA: Diagnosis present

## 2021-08-15 DIAGNOSIS — J431 Panlobular emphysema: Secondary | ICD-10-CM | POA: Diagnosis not present

## 2021-08-15 DIAGNOSIS — Z8249 Family history of ischemic heart disease and other diseases of the circulatory system: Secondary | ICD-10-CM

## 2021-08-15 DIAGNOSIS — D649 Anemia, unspecified: Secondary | ICD-10-CM

## 2021-08-15 DIAGNOSIS — I5032 Chronic diastolic (congestive) heart failure: Secondary | ICD-10-CM | POA: Diagnosis not present

## 2021-08-15 DIAGNOSIS — J9621 Acute and chronic respiratory failure with hypoxia: Secondary | ICD-10-CM | POA: Diagnosis present

## 2021-08-15 DIAGNOSIS — I272 Pulmonary hypertension, unspecified: Secondary | ICD-10-CM | POA: Diagnosis not present

## 2021-08-15 DIAGNOSIS — I509 Heart failure, unspecified: Secondary | ICD-10-CM | POA: Diagnosis not present

## 2021-08-15 DIAGNOSIS — I330 Acute and subacute infective endocarditis: Secondary | ICD-10-CM

## 2021-08-15 DIAGNOSIS — T826XXD Infection and inflammatory reaction due to cardiac valve prosthesis, subsequent encounter: Secondary | ICD-10-CM | POA: Diagnosis not present

## 2021-08-15 DIAGNOSIS — M1832 Unilateral post-traumatic osteoarthritis of first carpometacarpal joint, left hand: Secondary | ICD-10-CM | POA: Diagnosis present

## 2021-08-15 DIAGNOSIS — I340 Nonrheumatic mitral (valve) insufficiency: Secondary | ICD-10-CM | POA: Diagnosis not present

## 2021-08-15 DIAGNOSIS — J449 Chronic obstructive pulmonary disease, unspecified: Secondary | ICD-10-CM | POA: Diagnosis not present

## 2021-08-15 DIAGNOSIS — R4182 Altered mental status, unspecified: Secondary | ICD-10-CM | POA: Diagnosis not present

## 2021-08-15 DIAGNOSIS — R6889 Other general symptoms and signs: Secondary | ICD-10-CM | POA: Diagnosis not present

## 2021-08-15 DIAGNOSIS — Z881 Allergy status to other antibiotic agents status: Secondary | ICD-10-CM

## 2021-08-15 DIAGNOSIS — I110 Hypertensive heart disease with heart failure: Secondary | ICD-10-CM | POA: Diagnosis not present

## 2021-08-15 DIAGNOSIS — J90 Pleural effusion, not elsewhere classified: Secondary | ICD-10-CM | POA: Diagnosis not present

## 2021-08-15 DIAGNOSIS — A409 Streptococcal sepsis, unspecified: Secondary | ICD-10-CM | POA: Diagnosis not present

## 2021-08-15 DIAGNOSIS — R0689 Other abnormalities of breathing: Secondary | ICD-10-CM | POA: Diagnosis not present

## 2021-08-15 DIAGNOSIS — I700 Atherosclerosis of aorta: Secondary | ICD-10-CM | POA: Diagnosis not present

## 2021-08-15 DIAGNOSIS — Z87891 Personal history of nicotine dependence: Secondary | ICD-10-CM

## 2021-08-15 DIAGNOSIS — Z79899 Other long term (current) drug therapy: Secondary | ICD-10-CM

## 2021-08-15 DIAGNOSIS — G4733 Obstructive sleep apnea (adult) (pediatric): Secondary | ICD-10-CM | POA: Diagnosis present

## 2021-08-15 DIAGNOSIS — Z7951 Long term (current) use of inhaled steroids: Secondary | ICD-10-CM

## 2021-08-15 LAB — RAPID URINE DRUG SCREEN, HOSP PERFORMED
Amphetamines: NOT DETECTED
Barbiturates: NOT DETECTED
Benzodiazepines: POSITIVE — AB
Cocaine: NOT DETECTED
Opiates: NOT DETECTED
Tetrahydrocannabinol: NOT DETECTED

## 2021-08-15 LAB — COMPREHENSIVE METABOLIC PANEL
ALT: 36 U/L (ref 0–44)
AST: 68 U/L — ABNORMAL HIGH (ref 15–41)
Albumin: 3.2 g/dL — ABNORMAL LOW (ref 3.5–5.0)
Alkaline Phosphatase: 119 U/L (ref 38–126)
Anion gap: 10 (ref 5–15)
BUN: 31 mg/dL — ABNORMAL HIGH (ref 8–23)
CO2: 24 mmol/L (ref 22–32)
Calcium: 8.8 mg/dL — ABNORMAL LOW (ref 8.9–10.3)
Chloride: 95 mmol/L — ABNORMAL LOW (ref 98–111)
Creatinine, Ser: 1.94 mg/dL — ABNORMAL HIGH (ref 0.61–1.24)
GFR, Estimated: 36 mL/min — ABNORMAL LOW (ref >60)
Glucose, Bld: 155 mg/dL — ABNORMAL HIGH (ref 70–99)
Potassium: 3.4 mmol/L — ABNORMAL LOW (ref 3.5–5.1)
Sodium: 129 mmol/L — ABNORMAL LOW (ref 135–145)
Total Bilirubin: 1.4 mg/dL — ABNORMAL HIGH (ref 0.3–1.2)
Total Protein: 7.2 g/dL (ref 6.5–8.1)

## 2021-08-15 LAB — I-STAT ARTERIAL BLOOD GAS, ED
Acid-Base Excess: 2 mmol/L (ref 0.0–2.0)
Bicarbonate: 26 mmol/L (ref 20.0–28.0)
Calcium, Ion: 1.14 mmol/L — ABNORMAL LOW (ref 1.15–1.40)
HCT: 36 % — ABNORMAL LOW (ref 39.0–52.0)
Hemoglobin: 12.2 g/dL — ABNORMAL LOW (ref 13.0–17.0)
O2 Saturation: 96 %
Potassium: 3.4 mmol/L — ABNORMAL LOW (ref 3.5–5.1)
Sodium: 131 mmol/L — ABNORMAL LOW (ref 135–145)
TCO2: 27 mmol/L (ref 22–32)
pCO2 arterial: 37 mmHg (ref 32.0–48.0)
pH, Arterial: 7.455 — ABNORMAL HIGH (ref 7.350–7.450)
pO2, Arterial: 75 mmHg — ABNORMAL LOW (ref 83.0–108.0)

## 2021-08-15 LAB — URINALYSIS, MICROSCOPIC (REFLEX)

## 2021-08-15 LAB — CBC WITH DIFFERENTIAL/PLATELET
Abs Immature Granulocytes: 0.07 10*3/uL (ref 0.00–0.07)
Basophils Absolute: 0 10*3/uL (ref 0.0–0.1)
Basophils Relative: 0 %
Eosinophils Absolute: 0 10*3/uL (ref 0.0–0.5)
Eosinophils Relative: 0 %
HCT: 37.1 % — ABNORMAL LOW (ref 39.0–52.0)
Hemoglobin: 12.1 g/dL — ABNORMAL LOW (ref 13.0–17.0)
Immature Granulocytes: 1 %
Lymphocytes Relative: 3 %
Lymphs Abs: 0.3 10*3/uL — ABNORMAL LOW (ref 0.7–4.0)
MCH: 26.8 pg (ref 26.0–34.0)
MCHC: 32.6 g/dL (ref 30.0–36.0)
MCV: 82.1 fL (ref 80.0–100.0)
Monocytes Absolute: 0.9 10*3/uL (ref 0.1–1.0)
Monocytes Relative: 8 %
Neutro Abs: 10.1 10*3/uL — ABNORMAL HIGH (ref 1.7–7.7)
Neutrophils Relative %: 88 %
Platelets: 189 10*3/uL (ref 150–400)
RBC: 4.52 MIL/uL (ref 4.22–5.81)
RDW: 16 % — ABNORMAL HIGH (ref 11.5–15.5)
WBC: 11.3 10*3/uL — ABNORMAL HIGH (ref 4.0–10.5)
nRBC: 0 % (ref 0.0–0.2)

## 2021-08-15 LAB — URINALYSIS, ROUTINE W REFLEX MICROSCOPIC
Bilirubin Urine: NEGATIVE
Glucose, UA: >=500 mg/dL — AB
Ketones, ur: NEGATIVE mg/dL
Leukocytes,Ua: NEGATIVE
Nitrite: NEGATIVE
Protein, ur: 30 mg/dL — AB
Specific Gravity, Urine: 1.025 (ref 1.005–1.030)
pH: 5.5 (ref 5.0–8.0)

## 2021-08-15 LAB — RESP PANEL BY RT-PCR (FLU A&B, COVID) ARPGX2
Influenza A by PCR: NEGATIVE
Influenza B by PCR: NEGATIVE
SARS Coronavirus 2 by RT PCR: NEGATIVE

## 2021-08-15 LAB — CBG MONITORING, ED: Glucose-Capillary: 108 mg/dL — ABNORMAL HIGH (ref 70–99)

## 2021-08-15 LAB — PROTIME-INR
INR: 1.6 — ABNORMAL HIGH (ref 0.8–1.2)
Prothrombin Time: 19 seconds — ABNORMAL HIGH (ref 11.4–15.2)

## 2021-08-15 LAB — LACTIC ACID, PLASMA: Lactic Acid, Venous: 1.3 mmol/L (ref 0.5–1.9)

## 2021-08-15 MED ORDER — POTASSIUM CHLORIDE 10 MEQ/100ML IV SOLN
10.0000 meq | INTRAVENOUS | Status: AC
  Administered 2021-08-16 (×3): 10 meq via INTRAVENOUS
  Filled 2021-08-15 (×3): qty 100

## 2021-08-15 MED ORDER — SODIUM CHLORIDE 0.9 % IV SOLN
2.0000 g | Freq: Two times a day (BID) | INTRAVENOUS | Status: DC
  Administered 2021-08-16: 2 g via INTRAVENOUS
  Filled 2021-08-15: qty 2

## 2021-08-15 MED ORDER — LACTATED RINGERS IV BOLUS (SEPSIS)
1000.0000 mL | Freq: Once | INTRAVENOUS | Status: AC
  Administered 2021-08-15: 1000 mL via INTRAVENOUS

## 2021-08-15 MED ORDER — POLYETHYLENE GLYCOL 3350 17 G PO PACK: 17.0000 g | PACK | Freq: Every day | ORAL | Status: DC | PRN

## 2021-08-15 MED ORDER — SODIUM CHLORIDE 0.9 % IV SOLN
2.0000 g | Freq: Once | INTRAVENOUS | Status: AC
  Administered 2021-08-15: 2 g via INTRAVENOUS
  Filled 2021-08-15: qty 2

## 2021-08-15 MED ORDER — METHYLPREDNISOLONE SODIUM SUCC 125 MG IJ SOLR
80.0000 mg | Freq: Every day | INTRAMUSCULAR | Status: DC
  Administered 2021-08-15: 80 mg via INTRAVENOUS
  Filled 2021-08-15 (×2): qty 2

## 2021-08-15 MED ORDER — NOREPINEPHRINE 4 MG/250ML-% IV SOLN: 2.0000 ug/min | INTRAVENOUS | Status: DC

## 2021-08-15 MED ORDER — ORAL CARE MOUTH RINSE
15.0000 mL | Freq: Two times a day (BID) | OROMUCOSAL | Status: DC
  Administered 2021-08-16: 15 mL via OROMUCOSAL

## 2021-08-15 MED ORDER — ALBUTEROL SULFATE (2.5 MG/3ML) 0.083% IN NEBU
2.5000 mg | INHALATION_SOLUTION | RESPIRATORY_TRACT | Status: DC | PRN
  Administered 2021-08-18: 2.5 mg via RESPIRATORY_TRACT
  Filled 2021-08-15 (×2): qty 3

## 2021-08-15 MED ORDER — ONDANSETRON HCL 4 MG/2ML IJ SOLN: 4.0000 mg | Freq: Four times a day (QID) | INTRAMUSCULAR | Status: DC | PRN

## 2021-08-15 MED ORDER — ARFORMOTEROL TARTRATE 15 MCG/2ML IN NEBU
15.0000 ug | INHALATION_SOLUTION | Freq: Two times a day (BID) | RESPIRATORY_TRACT | Status: DC
  Administered 2021-08-15 – 2021-08-17 (×4): 15 ug via RESPIRATORY_TRACT
  Filled 2021-08-15 (×4): qty 2

## 2021-08-15 MED ORDER — BUDESONIDE 0.25 MG/2ML IN SUSP
0.2500 mg | Freq: Two times a day (BID) | RESPIRATORY_TRACT | Status: DC
  Administered 2021-08-15 – 2021-08-17 (×4): 0.25 mg via RESPIRATORY_TRACT
  Filled 2021-08-15 (×4): qty 2

## 2021-08-15 MED ORDER — SODIUM CHLORIDE 0.9 % IV SOLN
250.0000 mL | INTRAVENOUS | Status: DC
  Administered 2021-08-16: 250 mL via INTRAVENOUS

## 2021-08-15 MED ORDER — VANCOMYCIN HCL 1750 MG/350ML IV SOLN
1750.0000 mg | Freq: Once | INTRAVENOUS | Status: AC
  Administered 2021-08-15: 1750 mg via INTRAVENOUS
  Filled 2021-08-15: qty 350

## 2021-08-15 MED ORDER — PANTOPRAZOLE SODIUM 40 MG IV SOLR
40.0000 mg | Freq: Every day | INTRAVENOUS | Status: DC
  Administered 2021-08-15 – 2021-08-16 (×2): 40 mg via INTRAVENOUS
  Filled 2021-08-15 (×2): qty 40

## 2021-08-15 MED ORDER — INSULIN ASPART 100 UNIT/ML IJ SOLN
0.0000 [IU] | INTRAMUSCULAR | Status: DC
  Administered 2021-08-16: 5 [IU] via SUBCUTANEOUS
  Administered 2021-08-16: 2 [IU] via SUBCUTANEOUS
  Administered 2021-08-16: 8 [IU] via SUBCUTANEOUS
  Administered 2021-08-17: 5 [IU] via SUBCUTANEOUS
  Administered 2021-08-17: 2 [IU] via SUBCUTANEOUS
  Administered 2021-08-18: 3 [IU] via SUBCUTANEOUS
  Administered 2021-08-19 – 2021-08-20 (×3): 2 [IU] via SUBCUTANEOUS

## 2021-08-15 MED ORDER — VANCOMYCIN HCL IN DEXTROSE 1-5 GM/200ML-% IV SOLN: 1000.0000 mg | Freq: Once | INTRAVENOUS | Status: DC

## 2021-08-15 MED ORDER — IPRATROPIUM-ALBUTEROL 0.5-2.5 (3) MG/3ML IN SOLN: 3.0000 mL | Freq: Four times a day (QID) | RESPIRATORY_TRACT | Status: DC

## 2021-08-15 MED ORDER — VANCOMYCIN HCL 1250 MG/250ML IV SOLN: 1250.0000 mg | INTRAVENOUS | Status: DC

## 2021-08-15 MED ORDER — REVEFENACIN 175 MCG/3ML IN SOLN
175.0000 ug | Freq: Every day | RESPIRATORY_TRACT | Status: DC
  Administered 2021-08-16 – 2021-08-17 (×2): 175 ug via RESPIRATORY_TRACT
  Filled 2021-08-15 (×3): qty 3

## 2021-08-15 MED ORDER — METRONIDAZOLE 500 MG/100ML IV SOLN
500.0000 mg | Freq: Once | INTRAVENOUS | Status: AC
  Administered 2021-08-15: 500 mg via INTRAVENOUS
  Filled 2021-08-15: qty 100

## 2021-08-15 MED ORDER — CHLORHEXIDINE GLUCONATE CLOTH 2 % EX PADS
6.0000 | MEDICATED_PAD | Freq: Every day | CUTANEOUS | Status: DC
  Administered 2021-08-15 – 2021-08-22 (×6): 6 via TOPICAL

## 2021-08-15 MED ORDER — LACTATED RINGERS IV SOLN: INTRAVENOUS | Status: AC

## 2021-08-15 MED ORDER — DOCUSATE SODIUM 100 MG PO CAPS: 100.0000 mg | ORAL_CAPSULE | Freq: Two times a day (BID) | ORAL | Status: DC | PRN

## 2021-08-15 MED ORDER — CHLORHEXIDINE GLUCONATE 0.12 % MT SOLN
15.0000 mL | Freq: Two times a day (BID) | OROMUCOSAL | Status: DC
  Administered 2021-08-16 (×3): 15 mL via OROMUCOSAL
  Filled 2021-08-15: qty 15

## 2021-08-15 NOTE — Sepsis Progress Note (Signed)
Code Sepsis protocol being monitored by eLink. 

## 2021-08-15 NOTE — Progress Notes (Addendum)
Pharmacy Antibiotic Note  Sean Riley is a 71 y.o. male admitted on 08/15/2021 with sepsis.  Pharmacy has been consulted for vancomycin and cefepime dosing.  WBC elevated at 11.2, patient is currently afebrile. Scr is elevated above baseline at 1.94.    Plan: Cefepime 2g q 12h Vancomycin 1750mg  IV x 1 Vancomycin 1250mg  q 24h (estimated AUC 506) Goal AUC 400-550 Monitor renal function and signs of clinical improvement Follow-up cultures  Height: 6\' 1"  (185.4 cm) Weight: 86.2 kg (190 lb) IBW/kg (Calculated) : 79.9  Temp (24hrs), Avg:99.3 F (37.4 C), Min:99.3 F (37.4 C), Max:99.3 F (37.4 C)  No results for input(s): WBC, CREATININE, LATICACIDVEN, VANCOTROUGH, VANCOPEAK, VANCORANDOM, GENTTROUGH, GENTPEAK, GENTRANDOM, TOBRATROUGH, TOBRAPEAK, TOBRARND, AMIKACINPEAK, AMIKACINTROU, AMIKACIN in the last 168 hours.  CrCl cannot be calculated (Patient's most recent lab result is older than the maximum 21 days allowed.).    Allergies  Allergen Reactions   Simvastatin Other (See Comments)    Mental status changes   Celecoxib Other (See Comments)    GI UPSET AND INFLAMMATION   Codeine Other (See Comments)    GI UPSET AND INFLAMMATION   Nsaids Other (See Comments)    GI UPSET AND INFLAMMATION (can tolerate via IV)   Tape Other (See Comments)    Medical tape and Band-Aids PULL OFF THE SKIN; please use Coban wrap   Cephalexin Itching and Rash   Latex Rash    Antimicrobials this admission: 1/6 cefepime >>  1/6 vancomycin >>   Dose adjustments this admission: none  Microbiology results: 1/6 BCx: pending 1/6 UCx: pending   Thank you for involving pharmacy in this patient's care.  Elita Quick, PharmD PGY1 Ambulatory Care Pharmacy Resident 08/15/2021 7:12 PM  **Pharmacist phone directory can be found on [email or web address] listed under [place]**

## 2021-08-15 NOTE — ED Triage Notes (Addendum)
Pt BIBA from home. Pt has had fever and malaise x 2 days. Pt c/o cough and increased confusion. Pt has had difficulty ambulating . Pt satting 80% on 4L with EMS, 83% on 4L upon arrival. Pt received 1 g tylenol en route.  See prearrival vitals 102.1 132 HR 136/60 32 RR  98% NRB 80 % 4L

## 2021-08-15 NOTE — ED Provider Notes (Signed)
[facility] EMERGENCY DEPARTMENT Provider Note   CSN: [id number] Arrival date & time: 08/15/21  1622     History  Chief Complaint  Patient presents with   Weakness    Sean Riley is a 71 y.o. male with a past medical history significant for hyperlipidemia, hypertension, A. Fib on Eliquis, COPD, CHF, history of GI bleed who presents to the ED due to confusion, fever, and cough.  Spoke to patient's wife who notes he has not been acting his normal self since Monday.  She notes he has not been eating and has not been communicating like he normally does at baseline. She endorses an occasional cough.  Patient told his wife that he was having some rhinorrhea.  Patient in on chronic 3 L nasal cannula due to COPD however, it was increased last night to 4 L by his wife.  During my initial evaluation, patient denies chest pain, shortness of breath, and abdominal pain. Wife also notes that patient has been urinating on himself which is atypical for him. He is able to answer where he is and full name, but not year.  Patient was given 1 g Tylenol by EMS prior to arrival.  Upon EMS arrival to residents, patient found to have an O2 saturation at 80% on 4 L nasal cannula.  History obtained from patient and past medical records. No interpreter used during encounter.       Home Medications Prior to Admission medications   Medication Sig Start Date End Date Taking? Authorizing Provider  albuterol (PROAIR HFA) 108 (90 Base) MCG/ACT inhaler Inhale 2 puffs into the lungs every 4 (four) hours as needed for wheezing or shortness of breath. 06/09/21   Mannam, Hart Robinsons, MD  ALPRAZolam Duanne Moron) 1 MG tablet TAKE 1 TABLET BY MOUTH AT BEDTIME. 06/17/21   Colon Branch, MD  apixaban (ELIQUIS) 5 MG TABS tablet Take 1 tablet (5 mg total) by mouth 2 (two) times daily for 1 day. 12/26/20   Larey Dresser, MD  augmented betamethasone dipropionate (DIPROLENE-AF) 0.05 % cream APPLY TOPICALLY 2 TIMES DAILY AS  NEEDED. 07/15/21   Colon Branch, MD  bisacodyl (DULCOLAX) 5 MG EC tablet Take 5 mg by mouth daily as needed for moderate constipation.    [provider]  Budeson-Glycopyrrol-Formoterol (BREZTRI AEROSPHERE) 160-9-4.8 MCG/ACT AERO Inhale 2 puffs into the lungs in the morning and at bedtime. 06/09/21   Mannam, Hart Robinsons, MD  diphenhydrAMINE (BENADRYL) 25 MG tablet Take 25 mg by mouth daily as needed (allergies/runny nose).    [provider]  escitalopram (LEXAPRO) 10 MG tablet TAKE 1 TABLET DAILY 09/19/20   Colon Branch, MD  esomeprazole (NEXIUM) 40 MG capsule Take 1 capsule (40 mg total) by mouth daily before breakfast. 01/05/17   Kozlow, Donnamarie Poag, MD  flecainide (TAMBOCOR) 50 MG tablet TAKE ONE AND ONE-HALF TABLETS TWICE A DAY 11/29/20   Deboraha Sprang, MD  fluticasone [facility]) 50 MCG/ACT nasal spray Place 1 spray into both nostrils 2 (two) times daily as needed for allergies. Use one spray in each nostril twice daily. 05/31/18   Sueanne Margarita, MD  hydrocortisone 2.5 % cream Apply topically 2 (two) times daily. 06/26/21 06/26/22  Colon Branch, MD  hydroxypropyl methylcellulose / hypromellose (ISOPTO TEARS / GONIOVISC) 2.5 % ophthalmic solution Place 1-2 drops into both eyes 3 (three) times daily as needed (dry/irritated eyes).    [provider]  ipratropium-albuterol (DUONEB) 0.5-2.5 (3) MG/3ML SOLN Take 3 mLs by  nebulization every 4 (four) hours as needed. 03/12/21   [name], MD  JARDIANCE 10 MG TABS tablet TAKE 1 TABLET DAILY BEFORE BREAKFAST 04/23/21   Larey Dresser, MD  magnesium hydroxide (MILK OF MAGNESIA) 400 MG/5ML suspension Take 7.5 mLs by mouth daily as needed (stomach cramps/constipation).    [provider]  metoprolol tartrate (LOPRESSOR) 25 MG tablet Take 0.5 tablets (12.5 mg total) by mouth 2 (two) times daily. Please make yearly appt with Dr. Caryl Comes for January 2022 for future refills. Thank you 1st attempt 09/08/20   Shelly Coss, MD  OXYGEN  Inhale 3-4 L/min into the lungs continuous.    [provider]  potassium chloride SA (KLOR-CON M20) 20 MEQ tablet Take 2 tablets (40 mEq total) by mouth every morning AND 1 tablet (20 mEq total) every evening. 04/09/21   Larey Dresser, MD  rosuvastatin (CRESTOR) 5 MG tablet TAKE 1 TABLET DAILY 06/24/21   Colon Branch, MD  torsemide (DEMADEX) 20 MG tablet Take 4 tablets (80 mg total) by mouth daily. 04/09/21   Larey Dresser, MD  triamcinolone cream (KENALOG) 0.5 % Apply 1 application topically 3 (three) times daily. 07/17/21   Colon Branch, MD      Allergies    Simvastatin, Celecoxib, Codeine, Nsaids, Tape, Cephalexin, and Latex    Review of Systems   Review of Systems  Constitutional:  Positive for chills and fever.  HENT:  Positive for rhinorrhea.   Respiratory:  Positive for cough. Negative for shortness of breath.   Cardiovascular:  Negative for chest pain.  Gastrointestinal:  Negative for abdominal pain.  Genitourinary:  Negative for dysuria.  All other systems reviewed and are negative.  Physical Exam Updated Vital Signs BP (!) 108/54 (BP Location: Right Arm)    Pulse 81    Temp 99.4 F (37.4 C)    Resp 18    Ht 6\' 1"  (1.854 m)    Wt 86.2 kg    SpO2 96%    BMI 25.07 kg/m  Physical Exam Vitals and nursing note reviewed.  Constitutional:      General: He is not in acute distress.    Appearance: He is not ill-appearing.  HENT:     Head: Normocephalic.  Eyes:     Pupils: Pupils are equal, round, and reactive to light.  Cardiovascular:     Rate and Rhythm: Normal rate and regular rhythm.     Pulses: Normal pulses.     Heart sounds: Normal heart sounds. No murmur heard.   No friction rub. No gallop.  Pulmonary:     Effort: Pulmonary effort is normal.     Breath sounds: Normal breath sounds.     Comments: Patient on NRB Abdominal:     General: Abdomen is flat. There is no distension.     Palpations: Abdomen is soft.     Tenderness: There is no abdominal  tenderness. There is no guarding or rebound.  Musculoskeletal:        General: Normal range of motion.     Cervical back: Neck supple.  Skin:    General: Skin is warm and dry.  Neurological:     General: No focal deficit present.     Mental Status: He is alert.  Psychiatric:        Mood and Affect: Mood normal.        Behavior: Behavior normal.    ED Results / Procedures / Treatments   Labs (all labs ordered are  listed, but only abnormal results are displayed) Labs Reviewed  COMPREHENSIVE METABOLIC PANEL - Abnormal; Notable for the following components:      Result Value   Sodium 129 (*)    Potassium 3.4 (*)    Chloride 95 (*)    Glucose, Bld 155 (*)    BUN 31 (*)    Creatinine, Ser 1.94 (*)    Calcium 8.8 (*)    Albumin 3.2 (*)    AST 68 (*)    Total Bilirubin 1.4 (*)    GFR, Estimated 36 (*)    All other components within normal limits  CBC WITH DIFFERENTIAL/PLATELET - Abnormal; Notable for the following components:   WBC 11.3 (*)    Hemoglobin 12.1 (*)    HCT 37.1 (*)    RDW 16.0 (*)    Neutro Abs 10.1 (*)    Lymphs Abs 0.3 (*)    All other components within normal limits  PROTIME-INR - Abnormal; Notable for the following components:   Prothrombin Time 19.0 (*)    INR 1.6 (*)    All other components within normal limits  URINALYSIS, ROUTINE W REFLEX MICROSCOPIC - Abnormal; Notable for the following components:   Glucose, UA >=500 (*)    Hgb urine dipstick SMALL (*)    Protein, ur 30 (*)    All other components within normal limits  URINALYSIS, MICROSCOPIC (REFLEX) - Abnormal; Notable for the following components:   Bacteria, UA RARE (*)    All other components within normal limits  I-STAT ARTERIAL BLOOD GAS, ED - Abnormal; Notable for the following components:   pH, Arterial 7.455 (*)    pO2, Arterial 75 (*)    Sodium 131 (*)    Potassium 3.4 (*)    Calcium, Ion 1.14 (*)    HCT 36.0 (*)    Hemoglobin 12.2 (*)    All other components within normal limits   RESP PANEL BY RT-PCR (FLU A&B, COVID) ARPGX2  CULTURE, BLOOD (ROUTINE X 2)  CULTURE, BLOOD (ROUTINE X 2)  URINE CULTURE  MRSA NEXT GEN BY PCR, NASAL  LACTIC ACID, PLASMA  BRAIN NATRIURETIC PEPTIDE  APTT  PROCALCITONIN  PROCALCITONIN  CBC  BASIC METABOLIC PANEL  MAGNESIUM  PHOSPHORUS  BLOOD GAS, ARTERIAL    EKG None  Radiology DG Chest 2 View  Result Date: 08/15/2021 CLINICAL DATA:  Fever, malaise for 2 days, cough EXAM: CHEST - 2 VIEW COMPARISON:  Chest radiograph 04/15/2021 FINDINGS: Image quality is degraded by patient positioning, with the head overlying portions of the upper lobes. The cardiomediastinal silhouette is grossly stable, with unchanged cardiomegaly. 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 There are bilateral pleural effusions, overall similar to prior studies. There are patchy opacities in the lung bases, improved on the right but worsened on the left since 04/15/2021. There is no definite pneumothorax. There is no acute osseous abnormality. IMPRESSION: Overall unchanged bilateral pleural effusions since 04/15/2021, but with worsened aeration in the left base. Findings could reflect left lower lobe pneumonia superimposed on chronic changes in the correct clinical setting. Electronically Signed   By: Valetta Mole M.D.   On: 08/15/2021 17:42    Procedures .Critical Care Performed by: Suzy Bouchard, PA-C Authorized by: Suzy Bouchard, PA-C   Critical care provider statement:    Critical care time (minutes):  45   Critical care was necessary to treat or prevent imminent or life-threatening deterioration of the following conditions:  Respiratory failure  and sepsis   Critical care was time spent personally by me on the following activities:  Development of treatment plan with patient or surrogate, discussions with consultants, evaluation of patient's response to treatment, examination of patient, ordering and  review of laboratory studies, ordering and review of radiographic studies, ordering and performing treatments and interventions, pulse oximetry, re-evaluation of patient's condition and review of old charts   I assumed direction of critical care for this patient from another provider in my specialty: no     Care discussed with: admitting provider      Medications Ordered in ED Medications  lactated ringers infusion ( Intravenous New Bag/Given 08/15/21 1818)  ceFEPIme (MAXIPIME) 2 g in sodium chloride 0.9 % 100 mL IVPB (has no administration in time range)  vancomycin (VANCOREADY) IVPB 1250 mg/250 mL (has no administration in time range)  docusate sodium (COLACE) capsule 100 mg (has no administration in time range)  polyethylene glycol (MIRALAX / GLYCOLAX) packet 17 g (has no administration in time range)  ondansetron (ZOFRAN) injection 4 mg (has no administration in time range)  pantoprazole (PROTONIX) injection 40 mg (has no administration in time range)  0.9 %  sodium chloride infusion (has no administration in time range)  norepinephrine (LEVOPHED) 4mg  in 248mL (0.016 mg/mL) premix infusion (has no administration in time range)  ipratropium-albuterol (DUONEB) 0.5-2.5 (3) MG/3ML nebulizer solution 3 mL (has no administration in time range)  lactated ringers bolus 1,000 mL (0 mLs Intravenous Stopped 08/15/21 1838)    And  lactated ringers bolus 1,000 mL (0 mLs Intravenous Stopped 08/15/21 1943)    And  lactated ringers bolus 1,000 mL (0 mLs Intravenous Stopped 08/15/21 1943)  ceFEPIme (MAXIPIME) 2 g in sodium chloride 0.9 % 100 mL IVPB (0 g Intravenous Stopped 08/15/21 1803)  metroNIDAZOLE (FLAGYL) IVPB 500 mg (0 mg Intravenous Stopped 08/15/21 1833)  vancomycin (VANCOREADY) IVPB 1750 mg/350 mL (1,750 mg Intravenous New Bag/Given 08/15/21 1819)    ED Course/ Medical Decision Making/ A&P Clinical Course as of 08/15/21 2022  Fri Aug 15, 2021  1655 Discussed with Pam from pharmacy about antibiotics  given Keflex allergy. Per Pam, patient had Rocephin this year with no reaction. She believes patient will tolerate Cefepime.  [CA]  1724 WBC(!): 11.3 [CA]  1738 Patient's wife at bedside. Confirmed FULL CODE. Patient appears more drowsy compared to initial evaluation. RT in room drawing ABG. Will switch to BiPap with possible need to intubation. [CA]    Clinical Course User Index [CA] Suzy Bouchard, PA-C                           Medical Decision Making 71 year old male presents to the ED via EMS due to generalized weakness, occasional cough, and confusion x4 days.  Per patient's wife, patient has been urinating on himself.  Patient is on chronic oxygen which was increased last night by his wife.  Upon arrival, patient borderline febrile at 99.3 F, tachycardic at 127, tachypneic, and hypoxic on 4 L nasal cannula.  Patient placed on nonrebreather.  Code sepsis initiated after initial evaluation. Lungs clear to auscultation.  2+ pitting edema bilaterally.  Abdomen soft, nondistended, nontender. Sepsis labs ordered. IVFs and antibiotics started. See note above about discussion with pharmacy about antibiotics.    This patient presents to the ED for concern of generalized weakness, cough, confusion this involves an extensive number of treatment options, and is a complaint that carries with it a high risk  of complications and morbidity.  The differential diagnosis includes pneumonia, UTI, CHF, COPD exacerbation   Co morbidities that complicate the patient evaluation  COPD CHF   Additional history obtained:  Additional history obtained from patient's wife External records from outside source obtained and reviewed including PCP notes, cardiology, pulmonology   Lab Tests:  I Ordered, and personally interpreted labs.  The pertinent results include:  leukocytosis of 11.3, ABG with alkalosis with ph at 7.4.    Imaging Studies ordered:  I ordered imaging studies including CXR I  independently visualized and interpreted imaging which showed possible superimposed Left LL pneumonia, pleural effusions I agree with the radiologist interpretation   Cardiac Monitoring:  The patient was maintained on a cardiac monitor.  I personally viewed and interpreted the cardiac monitored which showed an underlying rhythm of: sinus tachycardia   Medicines ordered and prescription drug management:  I ordered medication including IVFs, antibiotics for sepsis Reevaluation of the patient after these medicines showed that the patient worsened I have reviewed the patients home medicines and have made adjustments as needed   Test Considered:  Considered CTA to rule out PE; however lower suspicion. Suspect symptoms related to sepsis secondary to pneumonia   Critical Interventions:  Patient switched from NRB to BiPAP due to worsening status.    Consultations Obtained:  I requested consultation with the Dr. Valeta Harms with critical care and discussed lab and imaging findings as well as pertinent plan - they recommend: admission to ICU   Problem List / ED Course:  71 year old male presents to the ED due to worsening shortness of breath. CXR positive for pneumonia. Patient likely septic secondary to pneumonia.  During patient's ED stay, his condition deteriorated.  Patient was transition from nonrebreather to BiPAP.  COVID/influenza negative.  UA negative for signs of infection. ABG demonstrates alkalosis.  Patient admitted under critical care service.    Reevaluation:  After the interventions noted above, I reevaluated the patient and found that they have :worsened   Dispostion:  After consideration of the diagnostic results and the patients response to treatment, I feel that the patent would benefit from admission.   Discussed with Dr. Jeanell Sparrow who evaluated patient at bedside and agrees with assessment and plan.           Final Clinical Impression(s) / ED Diagnoses Final  diagnoses:  Sepsis, due to unspecified organism, unspecified whether acute organ dysfunction present [facility])    Rx / Heath Orders ED Discharge Orders     None         Karie Kirks 08/15/21 2022    Pattricia Boss, MD 08/16/21 1744

## 2021-08-15 NOTE — H&P (Addendum)
NAME:  Sean Riley, MRN:  [id number], DOB:  June 06, 1951, LOS: 0 ADMISSION DATE:  08/15/2021, CONSULTATION DATE:  08/15/2020 REFERRING MD:  Jeanell Sparrow , CHIEF COMPLAINT:  Confusion   History of Present Illness:  Patient is encephalopathic. Therefore history has been obtained from chart review and wife [name].  Sean Riley, is a 71 y.o. male, who presented to the [facility] ED with a chief complaint of confusion, cough, and SOB  They have a pertinent past medical history of COPD on home O2, afib on eliquis, CHF, ascending aortic dissection s/p repair, AVR on 03/24/2017, HTN, GIB, GERD  Per [name] (Wife), the patient was feeling unwell over the past 2-3 days with generalized fatigue, fever, slight cough. The morning of 1/6 the patient was confused and difficult to arouse. EMS was called.   In the ED he was found to be hypoxic with sats of 83% on 4L on arrival. He was started on BiPAP. Initial ABG 7.45/37/75/26. He was found to be febrile with a tmax 102.1. BP 86/64 in ED. A code sepsis was called. UA, BC ordered. Vanc, cefepime, flagyl started. Possible LLL pneumonia on CXR. Red LLE. +1 edema to BLLE. WBC 11.3.  PCCM was consulted for admission.    Pertinent  Medical History  COPD on home O2, afib on eliquis, CHF, ascending aortic dissection s/p repair, AVR on 03/24/2017, HTN, GIB, GERD  Significant Hospital Events: Including procedures, antibiotic start and stop dates in addition to other pertinent events   1/6 presented to [facility] ED, Vanc, cefepime, flagy, BC>, UC>, ECHO  Interim History / Subjective:  See above  Unable to obtain subjective evaluation due to patient status  Objective   Blood pressure 103/68, pulse 95, temperature 99.9 F (37.7 C), resp. rate 18, height 6\' 1"  (1.854 m), weight 86.2 kg, SpO2 98 %.    Vent Mode: PSV;BIPAP FiO2 (%):  [60 %] 60 % PEEP:  [10 cmH20] 10 cmH20 Pressure Support:  [5 cmH20] 5 cmH20   Intake/Output Summary (Last 24 hours) at 08/15/2021 1914 Last data filed  at 08/15/2021 1902 Gross per 24 hour  Intake 198.9 ml  Output --  Net 198.9 ml   Filed Weights   08/15/21 1636  Weight: 86.2 kg    Examination: General: In bed, unwell appearing, on bipap HEENT: MM pink/moist, anicteric, atraumatic Neuro: Opens eyes to pain, some verbalization but communication limited d/t bipap, RASS -3 CV: S1S2, NSR, no m/r/g appreciated PULM:  air movement in all lobes, trachea midline, chest expansion symmetric GI: soft, bsx4 active, non-tender   Extremities: warm/dry, +1 pretibial edema, capillary refill less than 3 seconds  Skin: see photo below of LLE, no other rashes or lesions noted     7.45/37/75/26 NA 129 K 3.4 BUN 31, Creat 1.94 (baseline 1.08) Bili 1.4 Lactate 1.3 WBC 11.3 HGB 12.1 INR 1.6 BC/ UC pending COVID - ,FLU - 12 lead ST with RBBB, no significant ST changes noted.  Resolved Hospital Problem list     Assessment & Plan:  Septic shock Possible LLL pneumonia on CXR, WBC 11.3. Tmax 102.1 on arrival. BP 86/64 in ED. Lactate 1.3. UC/BC pending. 3L LR ordered in ED. No volume charted at this time. -Goal MAP 65 or greater. If patient becomes hypotensive, start levophed. -3L LR ordered in ED. Hold on further fluid administration -Obtain/Follow up BC and UC -On Cefepime and Vancomycin. Narrow as cultures result -Obtain procalcitonin, MRSA PCR -Obtain ECHO -Continue BiPAP  Acute encephalopathy, suspect secondary to hypoxia vs  sepsis -continue bipap -sepsis management as above -monitor neuro exam closely  Acute on chronic respiratory failure with hypoxia- 3L Home o2, sats 83% on arrival, suspect secondary to CAP HX COPD- patient of Dr. Vaughan Browner. On Breztri inhaler at home. -Continue BiPAP. Will monitor over the coming hours for possible intubation. Discussed with wife at bedside. -Scheduled duonebs, pulmicort.  -Pulm toilet as able -AM ABG -NPO -Start 40mg  IV solumedrol q12h  HX Afib on eliquis and flecainide (DCCV to NST on  11/21)  Ascending aortic dissection s/p repair AVR (03/24/17) HX chronic diastolic CHF- ECHO 7/86/76 EF 60-65% HX HTN S/P Cardiomems implant 5/22 Wife reports last dose of eliquis (and all home meds) was the evening of 1/5. In SR on exam. -Holding home antihypertensives and antiarrythmic medications at this time. Patient unable to swallow d/t bipap. If intubated will obtain enteral access.  -Will discuss [facility] plan with Dr. Valeta Harms.  -Follow up ECHO -Holding on diuresis at this time.  AKI BUN 31, Creat 1.94 (baseline 1.08), suspect secondary to sepsis -Ensure renal perfusion. Goal MAP 65 or greater. -Avoid neprotoxic drugs as possible. -Strict I&O's -Follow up AM creatinine -Continue foley  GERD -PPI  Hyponatremia, suspect chronic NA 129, 129-134 over the past 6 months. ?secondary to CHF -Monitor AM NA level on BNP s/p fluid resuscitation -Check serum osm   Best Practice (right click and "Reselect all SmartList Selections" daily)   Diet/type: NPO DVT prophylaxis: SCD GI prophylaxis: PPI Lines: N/A Foley:  Yes, and it is still needed Code Status:  full code Last date of multidisciplinary goals of care discussion [Full scope. Discussed with wife at bedside 1/6]  Labs   CBC: Recent Labs  Lab 08/15/21 1650 08/15/21 1743  WBC 11.3*  --   NEUTROABS 10.1*  --   HGB 12.1* 12.2*  HCT 37.1* 36.0*  MCV 82.1  --   PLT 189  --     Basic Metabolic Panel: Recent Labs  Lab 08/15/21 1743  NA 131*  K 3.4*   GFR: CrCl cannot be calculated (Patient's most recent lab result is older than the maximum 21 days allowed.). Recent Labs  Lab 08/15/21 1650  WBC 11.3*  LATICACIDVEN 1.3    Liver Function Tests: No results for input(s): AST, ALT, ALKPHOS, BILITOT, PROT, ALBUMIN in the last 168 hours. No results for input(s): LIPASE, AMYLASE in the last 168 hours. No results for input(s): AMMONIA in the last 168 hours.  ABG    Component Value Date/Time   PHART 7.455 (H)  08/15/2021 1743   PCO2ART 37.0 08/15/2021 1743   PO2ART 75 (L) 08/15/2021 1743   HCO3 26.0 08/15/2021 1743   TCO2 27 08/15/2021 1743   ACIDBASEDEF 2.0 03/25/2017 0742   O2SAT 96.0 08/15/2021 1743     Coagulation Profile: Recent Labs  Lab 08/15/21 1650  INR 1.6*    Cardiac Enzymes: No results for input(s): CKTOTAL, CKMB, CKMBINDEX, TROPONINI in the last 168 hours.  HbA1C: Hgb A1c MFr Bld  Date/Time Value Ref Range Status  01/25/2021 02:45 AM 4.4 (L) 4.8 - 5.6 % Final    Comment:    (NOTE)         Prediabetes: 5.7 - 6.4         Diabetes: >6.4         Glycemic control for adults with diabetes: <7.0   04/04/2017 04:43 AM 5.5 4.8 - 5.6 % Final    Comment:    (NOTE) Pre diabetes:  5.7%-6.4% Diabetes:              >6.4% Glycemic control for   <7.0% adults with diabetes     CBG: No results for input(s): GLUCAP in the last 168 hours.  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   Surgical History:   Past Surgical History:  Procedure Laterality Date   APPENDECTOMY     CARDIAC CATHETERIZATION      X3;Dr Caryl Comes, last 01/20/12: mild non-obstructive CAD, normal LV systolic function   CARDIOVERSION N/A 05/31/2018   Procedure: CARDIOVERSION;  Surgeon: Sueanne Margarita, MD;  Location: [facility] ENDOSCOPY;  Service: Cardiovascular;  Laterality: N/A;   CARDIOVERSION N/A 06/13/2020   Procedure:  CARDIOVERSION;  Surgeon: Larey Dresser, MD;  Location: [facility] ENDOSCOPY;  Service: Cardiovascular;  Laterality: N/A;   COLONOSCOPY     COLONOSCOPY N/A 01/27/2021   Procedure: COLONOSCOPY;  Surgeon: Gatha Mayer, MD;  Location: [facility] ENDOSCOPY;  Service: Endoscopy;  Laterality: N/A;   CYSTOSCOPY  1978   Dr Hartley Barefoot   ESOPHAGOGASTRODUODENOSCOPY N/A 01/27/2021   Procedure: ESOPHAGOGASTRODUODENOSCOPY (EGD);  Surgeon: Gatha Mayer, MD;  Location: [facility] ENDOSCOPY;  Service: Endoscopy;  Laterality: N/A;   EYE SURGERY     GIVENS CAPSULE STUDY N/A 01/27/2021   Procedure: GIVENS CAPSULE STUDY;  Surgeon: Gatha Mayer, MD;  Location: [place];  Service: Endoscopy;  Laterality: N/A;   IR THORACENTESIS ASP PLEURAL SPACE W/IMG GUIDE  11/25/2017   KNEE ARTHROSCOPY  2012   Dr Theda Sers   LUMBAR LAMINECTOMY  1987   Dr Lestine Box  2007   PERICARDIOCENTESIS N/A 04/07/2017   Procedure: PERICARDIOCENTESIS;  Surgeon: Sherren Mocha, MD;  Location: [place] CV LAB;  Service: Cardiovascular;  Laterality: N/A;   POLYPECTOMY     POLYPECTOMY  01/27/2021   Procedure: POLYPECTOMY;  Surgeon: Gatha Mayer, MD;  Location: [facility] ENDOSCOPY;  Service: Endoscopy;;   PRESSURE SENSOR/CARDIOMEMS N/A 12/26/2020   Procedure: PRESSURE SENSOR/CARDIOMEMS;  Surgeon: Larey Dresser, MD;  Location: [place] CV LAB;  Service: Cardiovascular;  Laterality: N/A;   REPAIR OF ACUTE ASCENDING THORACIC AORTIC DISSECTION N/A 03/23/2017   Procedure: REPAIR OF ACUTE ASCENDING THORACIC AORTIC DISSECTION.  Bentall procedure.  Aortic root repleacement with bioprosthetic valve.  Reimplantation of left and right coronary arteries.  Total resection of transverse aortic arch.  Elephant trunk distal anastomosis and debranching of arch vessels.;  Surgeon: Rexene Alberts, MD;  Location: [place];  Service: Vascular;  Laterality: N/A;   RIGHT HEART CATH N/A 06/15/2019   Procedure: RIGHT HEART CATH;  Surgeon: Larey Dresser, MD;  Location: [place] CV LAB;  Service: Cardiovascular;  Laterality: N/A;   ROTATOR CUFF REPAIR     SHOULDER ARTHROSCOPY  2011   Dr Theda Sers   SHOULDER ARTHROSCOPY Right 08/2015   SPINAL FUSION  1986   Dr Rolin Barry   TEE WITHOUT CARDIOVERSION N/A 03/23/2017   Procedure: TRANSESOPHAGEAL ECHOCARDIOGRAM (TEE);  Surgeon: Rexene Alberts, MD;  Location: [place];  Service: Open Heart Surgery;  Laterality:  N/A;   THORACIC AORTIC ENDOVASCULAR STENT GRAFT N/A 03/30/2018   Procedure: THORACIC AORTIC ENDOVASCULAR STENT GRAFT WITH INTRAVASCULAR ULTRASOUND;  Surgeon: Serafina Mitchell, MD;  Location: [facility] OR;  Service: Vascular;  Laterality: N/A;   TRACHEOSTOMY     age 4 for croup     Social History:   reports that he quit smoking about 4 years ago. His smoking use included cigarettes. He has a 90.00 pack-year smoking history. He has never used smokeless tobacco. He reports that he does not currently use alcohol. He reports that he does not use drugs.   Family History:  His family history includes Arrhythmia in his brother; Benign prostatic hyperplasia in his father; Breast cancer in his maternal grandmother; Heart attack in his maternal aunt and maternal grandmother; Hypertension in his father and mother. There is no history of Stroke, Diabetes, Colon cancer, Colon polyps, Stomach cancer, Rectal cancer, Esophageal cancer, Prostate cancer, or Other.   Allergies Allergies  Allergen Reactions   Simvastatin Other (See Comments)    Mental status changes   Celecoxib Other (See Comments)    GI UPSET AND INFLAMMATION   Codeine Other (See Comments)    GI UPSET AND INFLAMMATION   Nsaids Other (See Comments)    GI UPSET AND INFLAMMATION (can tolerate via IV)   Tape Other (See Comments)    Medical tape and Band-Aids PULL OFF THE SKIN; please use Coban wrap   Cephalexin Itching and Rash   Latex Rash     Home Medications  Prior to Admission medications   Medication Sig Start Date End Date Taking? Authorizing Provider   albuterol (PROAIR HFA) 108 (90 Base) MCG/ACT inhaler Inhale 2 puffs into the lungs every 4 (four) hours as needed for wheezing or shortness of breath. 06/09/21   Mannam, Hart Robinsons, MD  ALPRAZolam Duanne Moron) 1 MG tablet TAKE 1 TABLET BY MOUTH AT BEDTIME. 06/17/21   Colon Branch, MD  apixaban (ELIQUIS) 5 MG TABS tablet Take 1 tablet (5 mg total) by mouth 2 (two) times daily for 1 day. 12/26/20   Larey Dresser, MD  augmented betamethasone dipropionate (DIPROLENE-AF) 0.05 % cream APPLY TOPICALLY 2 TIMES DAILY AS NEEDED. 07/15/21   Colon Branch, MD  bisacodyl (DULCOLAX) 5 MG EC tablet Take 5 mg by mouth daily as needed for moderate constipation.    [provider]  Budeson-Glycopyrrol-Formoterol (BREZTRI AEROSPHERE) 160-9-4.8 MCG/ACT AERO Inhale 2 puffs into the lungs in the morning and at bedtime. 06/09/21   Mannam, Hart Robinsons, MD  diphenhydrAMINE (BENADRYL) 25 MG tablet Take 25 mg by mouth daily as needed (allergies/runny nose).    [provider]  escitalopram (LEXAPRO) 10 MG tablet TAKE 1 TABLET DAILY 09/19/20   Colon Branch, MD  esomeprazole (NEXIUM) 40 MG capsule Take 1 capsule (40 mg total) by mouth daily before breakfast. 01/05/17   Kozlow, Donnamarie Poag, MD  flecainide (TAMBOCOR) 50 MG tablet TAKE ONE AND ONE-HALF TABLETS TWICE A DAY 11/29/20   Deboraha Sprang, MD  fluticasone [facility]) 50 MCG/ACT nasal spray Place 1 spray into both nostrils 2 (two) times daily as needed for allergies. Use one spray in each nostril twice daily. 05/31/18   Sueanne Margarita, MD  hydrocortisone 2.5 % cream Apply topically 2 (two) times daily. 06/26/21 06/26/22  Colon Branch, MD  hydroxypropyl methylcellulose / hypromellose (ISOPTO TEARS / GONIOVISC) 2.5 % ophthalmic solution Place 1-2 drops into both eyes 3 (three) times daily as needed (dry/irritated eyes).    [provider]  ipratropium-albuterol (DUONEB) 0.5-2.5 (3) MG/3ML SOLN Take 3 mLs by nebulization every 4 (four) hours as needed. 03/12/21   [name], MD  JARDIANCE 10 MG TABS tablet TAKE 1 TABLET DAILY BEFORE BREAKFAST 04/23/21   Larey Dresser, MD  magnesium hydroxide (MILK OF MAGNESIA) 400 MG/5ML suspension Take 7.5 mLs by mouth daily as needed (stomach cramps/constipation).    [provider]  metoprolol tartrate (LOPRESSOR) 25 MG tablet Take 0.5 tablets (12.5 mg total) by mouth 2 (two) times daily. Please make yearly appt with Dr. Caryl Comes for January 2022 for future refills. Thank you 1st attempt 09/08/20   Shelly Coss, MD  OXYGEN Inhale 3-4 L/min into the lungs continuous.    [provider]  potassium chloride SA (KLOR-CON M20) 20 MEQ tablet Take 2 tablets (40 mEq total) by mouth every morning AND 1 tablet (20 mEq total) every evening. 04/09/21   Larey Dresser, MD  rosuvastatin (CRESTOR) 5 MG tablet TAKE 1 TABLET DAILY 06/24/21   Colon Branch, MD  torsemide (DEMADEX) 20 MG tablet Take 4 tablets (80 mg total) by mouth daily. 04/09/21   Larey Dresser, MD  triamcinolone cream (KENALOG) 0.5 % Apply 1 application topically 3 (three) times daily. 07/17/21   Colon Branch, MD     Critical care time: 109 minutes    [employer] MSN, APRN, AGACNP-BC [facility]  08/15/2021 , 8:48 PM  Please see [email or web address] for pager details  If no response, please call [phone] After hours, please call Elink at [phone]   PCCM Attending:   This is a 71 year old gentleman, past medical history of COPD on home oxygen therapy, former smoker quit 2018 after an aortic dissection repair with AVR by Dr. Roxy Manns, hypertension, GI bleed, GERD.  Patient was brought to the emergency room found to be hypoxemic sats in the 80s placed on nasal cannula slowly become more somnolent was placed on BiPAP found to be febrile temperature of 102 slightly hypotensive with a systolic blood pressure in the 80s was given IV fluids and antibiotics.  Chest x-ray with concern for left lower lobe pneumonia.  Also has an  area of redness along the right foot ankle and lower extremity which has been going on for the past couple of weeks per wife.  Patient has elevated white blood cell count pulmonary was consulted for consideration for ICU admission.  BP (!) 108/54 (BP Location: Right Arm)    Pulse 81    Temp 99.4 F (37.4 C)    Resp 18    Ht 6\' 1"  (1.854 m)    Wt 86.2 kg    SpO2 96%    BMI 25.07 kg/m   General: Chronically ill-appearing elderly male, on BiPAP will wake up with stimulation HEENT: BiPAP mask in place, mucous membranes dry, tracking appropriately Heart: Regular rate rhythm S1-S2 Lungs: Bilateral ventilated breath sounds on BiPAP Abdomen: Mildly distended Extremities: Redness of the left lower extremity distal shin ankle and foot. 10 digit onychomycosis of the feet.  Labs: Reviewed white blood cell count 11,000 Arterial blood gas reviewed  Assessment: Acute on chronic hypoxemic respiratory failure requiring BiPAP support Acute exacerbation of COPD Left lower lobe pneumonia Sepsis secondary to above Chronic bilateral effusions, on anticoagulation Atrial fibrillation on Eliquis Acute metabolic encephalopathy secondary to above AKI GERD Hyponatremia  Plan: Remains on BiPAP Continue to observe mental status, if it declines any further high risk for intubation need. Patient is a full  code discussed this with patient's wife at bedside. Start heparin Hold Eliquis Pulmicort, Brovana, Yupelri, as needed albuterol Solu-Medrol 40 mg IV every 12 hours CT head CT chest without contrast,?  May needBedside ultrasound and consideration for thoracentesis. Avoid nephrotoxic agents, follow urine output Admit to the intensive care unit  This patient is critically ill with multiple organ system failure; which, requires frequent high complexity decision making, assessment, support, evaluation, and titration of therapies. This was completed through the application of advanced monitoring technologies and  extensive interpretation of multiple databases. During this encounter critical care time was devoted to patient care services described in this note for 50 minutes.   [facility] 08/15/2021 9:07 PM

## 2021-08-15 NOTE — Progress Notes (Signed)
Patient transported to 7S28 without complications on the NIV machine. Report given to unit RRT.   Mahlani Berninger L. Tamala Julian, BS, RRT-ACCS, RCP

## 2021-08-15 NOTE — Progress Notes (Signed)
Secure chat placed to patient's RN in ED. Patient currently has 3 working PIV's and a consult was placed for USGIV due to pressor order. RN stated he is not currently receiving the pressors; instructed her to place another consult if pressors are ultimately required however he has sufficient access for now. She is in agreement with plan.   Nova Schmuhl Lorita Officer, RN

## 2021-08-15 NOTE — Progress Notes (Signed)
eLink Physician-Brief Progress Note Patient Name: Sean Riley DOB: February 01, 1951 MRN: [id number]   Date of Service  08/15/2021  HPI/Events of Note  Patient admitted with pneumonia, acute on chronic respiratory failure, COPD exacerbation , and altered mental status, he is currently on BIPAP.  eICU Interventions  New Patient Evaluation        Frederik Pear 08/15/2021, 11:59 PM

## 2021-08-16 ENCOUNTER — Inpatient Hospital Stay (HOSPITAL_COMMUNITY): Payer: Medicare Other

## 2021-08-16 DIAGNOSIS — R6521 Severe sepsis with septic shock: Secondary | ICD-10-CM

## 2021-08-16 DIAGNOSIS — J9601 Acute respiratory failure with hypoxia: Secondary | ICD-10-CM

## 2021-08-16 DIAGNOSIS — A419 Sepsis, unspecified organism: Secondary | ICD-10-CM | POA: Diagnosis not present

## 2021-08-16 DIAGNOSIS — B952 Enterococcus as the cause of diseases classified elsewhere: Secondary | ICD-10-CM

## 2021-08-16 DIAGNOSIS — J449 Chronic obstructive pulmonary disease, unspecified: Secondary | ICD-10-CM

## 2021-08-16 DIAGNOSIS — I4891 Unspecified atrial fibrillation: Secondary | ICD-10-CM

## 2021-08-16 DIAGNOSIS — R7881 Bacteremia: Secondary | ICD-10-CM | POA: Diagnosis not present

## 2021-08-16 DIAGNOSIS — Z952 Presence of prosthetic heart valve: Secondary | ICD-10-CM | POA: Diagnosis not present

## 2021-08-16 DIAGNOSIS — A409 Streptococcal sepsis, unspecified: Secondary | ICD-10-CM | POA: Diagnosis not present

## 2021-08-16 DIAGNOSIS — R6520 Severe sepsis without septic shock: Secondary | ICD-10-CM

## 2021-08-16 DIAGNOSIS — R21 Rash and other nonspecific skin eruption: Secondary | ICD-10-CM

## 2021-08-16 LAB — BLOOD CULTURE ID PANEL (REFLEXED) - BCID2

## 2021-08-16 LAB — CBC
HCT: 34.2 % — ABNORMAL LOW (ref 39.0–52.0)
HCT: 36.7 % — ABNORMAL LOW (ref 39.0–52.0)
Hemoglobin: 10.7 g/dL — ABNORMAL LOW (ref 13.0–17.0)
Hemoglobin: 11 g/dL — ABNORMAL LOW (ref 13.0–17.0)
MCH: 26.4 pg (ref 26.0–34.0)
MCH: 25.9 pg — ABNORMAL LOW (ref 26.0–34.0)
MCHC: 31.3 g/dL (ref 30.0–36.0)
MCHC: 30 g/dL (ref 30.0–36.0)
MCV: 84.4 fL (ref 80.0–100.0)
MCV: 86.6 fL (ref 80.0–100.0)
Platelets: 137 10*3/uL — ABNORMAL LOW (ref 150–400)
Platelets: 147 10*3/uL — ABNORMAL LOW (ref 150–400)
RBC: 4.05 MIL/uL — ABNORMAL LOW (ref 4.22–5.81)
RBC: 4.24 MIL/uL (ref 4.22–5.81)
RDW: 16.3 % — ABNORMAL HIGH (ref 11.5–15.5)
RDW: 16.2 % — ABNORMAL HIGH (ref 11.5–15.5)
WBC: 10.4 10*3/uL (ref 4.0–10.5)
WBC: 13.8 10*3/uL — ABNORMAL HIGH (ref 4.0–10.5)
nRBC: 0 % (ref 0.0–0.2)
nRBC: 0 % (ref 0.0–0.2)

## 2021-08-16 LAB — ECHOCARDIOGRAM COMPLETE
AR max vel: 3.43 cm2
AV Area VTI: 5.04 cm2
AV Area mean vel: 3.28 cm2
AV Mean grad: 2 mmHg
AV Peak grad: 4.6 mmHg
Ao pk vel: 1.07 m/s
Height: 73 in
S' Lateral: 4.1 cm
Weight: 3061.75 oz

## 2021-08-16 LAB — BASIC METABOLIC PANEL
Anion gap: 11 (ref 5–15)
BUN: 27 mg/dL — ABNORMAL HIGH (ref 8–23)
CO2: 24 mmol/L (ref 22–32)
Calcium: 8.4 mg/dL — ABNORMAL LOW (ref 8.9–10.3)
Chloride: 98 mmol/L (ref 98–111)
Creatinine, Ser: 1.61 mg/dL — ABNORMAL HIGH (ref 0.61–1.24)
GFR, Estimated: 45 mL/min — ABNORMAL LOW (ref >60)
Glucose, Bld: 128 mg/dL — ABNORMAL HIGH (ref 70–99)
Potassium: 3.7 mmol/L (ref 3.5–5.1)
Sodium: 133 mmol/L — ABNORMAL LOW (ref 135–145)

## 2021-08-16 LAB — GLUCOSE, CAPILLARY
Glucose-Capillary: 124 mg/dL — ABNORMAL HIGH (ref 70–99)
Glucose-Capillary: 123 mg/dL — ABNORMAL HIGH (ref 70–99)
Glucose-Capillary: 120 mg/dL — ABNORMAL HIGH (ref 70–99)
Glucose-Capillary: 110 mg/dL — ABNORMAL HIGH (ref 70–99)
Glucose-Capillary: 104 mg/dL — ABNORMAL HIGH (ref 70–99)
Glucose-Capillary: 208 mg/dL — ABNORMAL HIGH (ref 70–99)
Glucose-Capillary: 273 mg/dL — ABNORMAL HIGH (ref 70–99)
Glucose-Capillary: 181 mg/dL — ABNORMAL HIGH (ref 70–99)

## 2021-08-16 LAB — POCT I-STAT 7, (LYTES, BLD GAS, ICA,H+H)
Acid-Base Excess: 2 mmol/L (ref 0.0–2.0)
Bicarbonate: 26.4 mmol/L (ref 20.0–28.0)
Calcium, Ion: 1.2 mmol/L (ref 1.15–1.40)
HCT: 34 % — ABNORMAL LOW (ref 39.0–52.0)
Hemoglobin: 11.6 g/dL — ABNORMAL LOW (ref 13.0–17.0)
O2 Saturation: 97 %
Patient temperature: 98.6
Potassium: 4.3 mmol/L (ref 3.5–5.1)
Sodium: 133 mmol/L — ABNORMAL LOW (ref 135–145)
TCO2: 28 mmol/L (ref 22–32)
pCO2 arterial: 39.5 mmHg (ref 32.0–48.0)
pH, Arterial: 7.432 (ref 7.350–7.450)
pO2, Arterial: 84 mmHg (ref 83.0–108.0)

## 2021-08-16 LAB — HEPARIN LEVEL (UNFRACTIONATED): Heparin Unfractionated: >1.1 IU/mL — ABNORMAL HIGH (ref 0.30–0.70)

## 2021-08-16 LAB — APTT
aPTT: 40 seconds — ABNORMAL HIGH (ref 24–36)
aPTT: 67 seconds — ABNORMAL HIGH (ref 24–36)

## 2021-08-16 LAB — BRAIN NATRIURETIC PEPTIDE: B Natriuretic Peptide: 303.9 pg/mL — ABNORMAL HIGH (ref 0.0–100.0)

## 2021-08-16 LAB — MAGNESIUM: Magnesium: 2 mg/dL (ref 1.7–2.4)

## 2021-08-16 LAB — PHOSPHORUS: Phosphorus: 3.9 mg/dL (ref 2.5–4.6)

## 2021-08-16 LAB — OSMOLALITY: Osmolality: 284 mOsm/kg (ref 275–295)

## 2021-08-16 LAB — MRSA NEXT GEN BY PCR, NASAL: MRSA by PCR Next Gen: NOT DETECTED

## 2021-08-16 LAB — PROCALCITONIN: Procalcitonin: 0.78 ng/mL

## 2021-08-16 LAB — AMMONIA: Ammonia: 31 umol/L (ref 9–35)

## 2021-08-16 LAB — HEMOGLOBIN A1C
Hgb A1c MFr Bld: 5.5 % (ref 4.8–5.6)
Mean Plasma Glucose: 111.15 mg/dL

## 2021-08-16 MED ORDER — POTASSIUM CHLORIDE 10 MEQ/100ML IV SOLN
10.0000 meq | INTRAVENOUS | Status: AC
  Administered 2021-08-16: 10 meq via INTRAVENOUS
  Filled 2021-08-16 (×3): qty 100

## 2021-08-16 MED ORDER — EPINEPHRINE 1 MG/10ML IJ SOSY
PREFILLED_SYRINGE | INTRAMUSCULAR | Status: AC
  Filled 2021-08-16: qty 10

## 2021-08-16 MED ORDER — TRIAMCINOLONE ACETONIDE 0.1 % EX CREA
TOPICAL_CREAM | Freq: Two times a day (BID) | CUTANEOUS | Status: DC
  Administered 2021-08-17 – 2021-08-18 (×2): 1 via TOPICAL
  Filled 2021-08-16 (×2): qty 15

## 2021-08-16 MED ORDER — PERFLUTREN LIPID MICROSPHERE
1.0000 mL | INTRAVENOUS | Status: AC | PRN
  Administered 2021-08-16: 3 mL via INTRAVENOUS
  Filled 2021-08-16: qty 10

## 2021-08-16 MED ORDER — SODIUM CHLORIDE 0.9 % IV SOLN
2.0000 g | INTRAVENOUS | Status: DC
  Administered 2021-08-16 – 2021-08-22 (×37): 2 g via INTRAVENOUS
  Filled 2021-08-16 (×39): qty 2000

## 2021-08-16 MED ORDER — HEPARIN (PORCINE) 25000 UT/250ML-% IV SOLN
1600.0000 [IU]/h | INTRAVENOUS | Status: DC
  Administered 2021-08-16: 06:00:00 1300 [IU]/h via INTRAVENOUS
  Administered 2021-08-16 – 2021-08-17 (×2): 1350 [IU]/h via INTRAVENOUS
  Administered 2021-08-18: 1400 [IU]/h via INTRAVENOUS
  Administered 2021-08-20 – 2021-08-22 (×3): 1600 [IU]/h via INTRAVENOUS
  Filled 2021-08-16 (×9): qty 250

## 2021-08-16 NOTE — Plan of Care (Signed)
Goals of care and care plan completed. Patient educated.

## 2021-08-16 NOTE — Progress Notes (Signed)
Patient refused K+ runs at 0537. E link MD was notified. Patient was educated about Potassium medication and still refused. No further concerns.

## 2021-08-16 NOTE — Progress Notes (Signed)
PHARMACY - PHYSICIAN COMMUNICATION CRITICAL VALUE ALERT - BLOOD CULTURE IDENTIFICATION (BCID)  Sean Riley is an 71 y.o. male who presented to [facility] on 08/15/2021  Assessment:  10 yom presenting with septic shock, possible LLL PNA, now with 4/4 BCx bottles growing E.faecalis with no resistance detected. "Itching, rash" allergy reported previously to cephalexin but patient has tolerated amoxicillin, augmentin, and multiple other beta lactams in the past per chart review. MRSA pcr negative.  Name of physician (or Provider) Contacted: Tamala Julian, D (CCM)  Current antibiotics: vancomycin/cefepime  Changes to prescribed antibiotics recommended:  Change antibiotic regimen to ampicillin 2g IV q4h and monitor renal function  Results for orders placed or performed during the hospital encounter of 08/15/21  Blood Culture ID Panel (Reflexed) (Collected: 08/15/2021  5:02 PM)  Result Value Ref Range   Enterococcus faecalis DETECTED (A) NOT DETECTED   Enterococcus Faecium NOT DETECTED NOT DETECTED   Listeria monocytogenes NOT DETECTED NOT DETECTED   Staphylococcus species NOT DETECTED NOT DETECTED   Staphylococcus aureus (BCID) NOT DETECTED NOT DETECTED   Staphylococcus epidermidis NOT DETECTED NOT DETECTED   Staphylococcus lugdunensis NOT DETECTED NOT DETECTED   Streptococcus species NOT DETECTED NOT DETECTED   Streptococcus agalactiae NOT DETECTED NOT DETECTED   Streptococcus pneumoniae NOT DETECTED NOT DETECTED   Streptococcus pyogenes NOT DETECTED NOT DETECTED   A.calcoaceticus-baumannii NOT DETECTED NOT DETECTED   Bacteroides fragilis NOT DETECTED NOT DETECTED   Enterobacterales NOT DETECTED NOT DETECTED   Enterobacter cloacae complex NOT DETECTED NOT DETECTED   Escherichia coli NOT DETECTED NOT DETECTED   Klebsiella aerogenes NOT DETECTED NOT DETECTED   Klebsiella oxytoca NOT DETECTED NOT DETECTED   Klebsiella pneumoniae NOT DETECTED NOT DETECTED   Proteus species NOT DETECTED NOT  DETECTED   Salmonella species NOT DETECTED NOT DETECTED   Serratia marcescens NOT DETECTED NOT DETECTED   Haemophilus influenzae NOT DETECTED NOT DETECTED   Neisseria meningitidis NOT DETECTED NOT DETECTED   Pseudomonas aeruginosa NOT DETECTED NOT DETECTED   Stenotrophomonas maltophilia NOT DETECTED NOT DETECTED   Candida albicans NOT DETECTED NOT DETECTED   Candida auris NOT DETECTED NOT DETECTED   Candida glabrata NOT DETECTED NOT DETECTED   Candida krusei NOT DETECTED NOT DETECTED   Candida parapsilosis NOT DETECTED NOT DETECTED   Candida tropicalis NOT DETECTED NOT DETECTED   Cryptococcus neoformans/gattii NOT DETECTED NOT DETECTED   Vancomycin resistance NOT DETECTED NOT DETECTED    Arturo Morton, PharmD, BCPS Please check AMION for all [facility] contact numbers Clinical Pharmacist 08/16/2021 8:45 AM

## 2021-08-16 NOTE — Progress Notes (Signed)
eLink Physician-Brief Progress Note Patient Name: [name] DOB: 1951/05/19 MRN: [id number]   Date of Service  08/16/2021  HPI/Events of Note  Heart rate 70's currently, iv fluid infusing at 150 ml /hour.  eICU Interventions  Continue to monitor heart rate and Rx  if > 115 bpm,  LR  gtt reduced to 75 ml / hour.        Kerry Kass Lorain Keast 08/16/2021, 12:20 AM

## 2021-08-16 NOTE — Progress Notes (Signed)
°  Echocardiogram 2D Echocardiogram has been performed.  Sean Riley 08/16/2021, 11:02 AM

## 2021-08-16 NOTE — Progress Notes (Signed)
eLink Physician-Brief Progress Note Patient Name: Sean Riley DOB: February 01, 1951 MRN: [id number]   Date of Service  08/16/2021  HPI/Events of Note  CT results reviewed, no acute abnormalities.  eICU Interventions  Heparin gtt ordered per pharmacy protocol.        Kerry Kass Shatasha Lambing 08/16/2021, 4:01 AM

## 2021-08-16 NOTE — Progress Notes (Signed)
eLink Physician-Brief Progress Note Patient Name: Sean Riley DOB: 03/22/1951 MRN: [id number]   Date of Service  08/16/2021  HPI/Events of Note  Awaiting official results of  CT head and chest.                                      e  eICU Interventions          Frederik Pear 08/16/2021, 3:30 AM

## 2021-08-16 NOTE — Progress Notes (Signed)
ANTICOAGULATION CONSULT NOTE - Initial Consult  Pharmacy Consult for heparin Indication: atrial fibrillation  Allergies  Allergen Reactions   Simvastatin Other (See Comments)    Mental status changes   Celecoxib Other (See Comments)    GI UPSET AND INFLAMMATION   Codeine Other (See Comments)    GI UPSET AND INFLAMMATION   Nsaids Other (See Comments)    GI UPSET AND INFLAMMATION (can tolerate via IV)   Tape Other (See Comments)    Medical tape and Band-Aids PULL OFF THE SKIN; please use Coban wrap   Cephalexin Itching and Rash   Latex Rash    Patient Measurements: Height: 6\' 1"  (185.4 cm) Weight: 86.2 kg (190 lb) IBW/kg (Calculated) : 79.9 Heparin Dosing Weight: TBW   Vital Signs: Temp: 97.6 F (36.4 C) (01/06 2339) Temp Source: Axillary (01/06 2339) BP: 107/65 (01/07 0300) Pulse Rate: 72 (01/07 0300)  Labs: Recent Labs    08/15/21 1650 08/15/21 1743 08/16/21 0149  HGB 12.1* 12.2* 10.7*  HCT 37.1* 36.0* 34.2*  PLT 189  --  137*  APTT  --   --  40*  LABPROT 19.0*  --   --   INR 1.6*  --   --   CREATININE 1.94*  --  1.61*    Estimated Creatinine Clearance: 47.6 mL/min (A) (by C-G formula based on SCr of 1.61 mg/dL (H)).   Medical History: 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 LUTS    Hypertension    Microscopic hematuria    negative cystoscopy   NONSPECIFIC ABNORMAL ELECTROCARDIOGRAM    Pleural effusion on right    Pleural effusion, bilateral    S/P aortic dissection repair 03/24/2017   Biological Bentall aortic root replacement + resection and grafting of entire ascending aorta, transverse aortic arch and proximal descending thoracic aorta with elephant trunk distal anastomosis and  debranching of aortic arch vessels   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 54 g 3 08/14/2021   ALPRAZolam (XANAX) 1 MG tablet TAKE 1 TABLET BY MOUTH AT BEDTIME. (Patient taking differently: Take 1 mg by mouth at bedtime.) 30 tablet 3 08/14/2021   apixaban (ELIQUIS) 5 MG TABS tablet Take 5 mg by mouth 2 (two) times daily.   08/14/2021   augmented betamethasone dipropionate (DIPROLENE-AF) 0.05 % cream APPLY TOPICALLY 2 TIMES DAILY AS NEEDED. (Patient taking differently: Apply 1 application topically 2 (two) times daily as needed (itching).) 50 g 1 Past Week   bisacodyl (DULCOLAX) 5 MG EC tablet Take 5 mg by mouth daily as needed for moderate constipation.   unk   Budeson-Glycopyrrol-Formoterol (BREZTRI AEROSPHERE) 160-9-4.8 MCG/ACT AERO Inhale 2 puffs into the lungs in the morning and at bedtime. 32.1 g  3 08/14/2021   diphenhydrAMINE (BENADRYL) 25 MG tablet Take 25 mg by mouth daily as needed (allergies/runny nose).   08/11/2021   escitalopram (LEXAPRO) 10 MG tablet TAKE 1 TABLET DAILY (Patient taking differently: Take 10 mg by mouth daily.) 90 tablet 3 08/14/2021   esomeprazole (NEXIUM) 40 MG capsule Take 1 capsule (40 mg total) by mouth daily before breakfast. 90 capsule 3 08/14/2021   flecainide (TAMBOCOR) 50 MG tablet TAKE ONE AND ONE-HALF TABLETS TWICE A DAY (Patient taking differently: Take 75 mg by mouth 2 (two) times daily.) 270 tablet 2 08/14/2021   fluticasone (FLONASE) 50 MCG/ACT nasal spray Place 1 spray into both nostrils 2 (two) times daily as needed  for allergies. Use one spray in each nostril twice daily.   Past Month   hydroxypropyl methylcellulose / hypromellose (ISOPTO TEARS / GONIOVISC) 2.5 % ophthalmic solution Place 1-2 drops into both eyes 3 (three) times daily as needed (dry/irritated eyes).   unk   ipratropium-albuterol (DUONEB) 0.5-2.5 (3) MG/3ML SOLN Take 3 mLs by nebulization every 4 (four) hours as needed. (Patient taking differently: Take 3 mLs by nebulization every 4 (four) hours as needed (shortness of breath).) 360 mL 11 Past Month   JARDIANCE 10 MG TABS tablet TAKE 1 TABLET DAILY BEFORE BREAKFAST (Patient taking differently: Take 10 mg by mouth daily.) 90 tablet 3 08/14/2021   magnesium hydroxide (MILK OF MAGNESIA) 400 MG/5ML suspension Take 7.5 mLs by mouth daily as needed (stomach cramps/constipation).   unk   metoprolol tartrate (LOPRESSOR) 25 MG tablet Take 0.5 tablets (12.5 mg total) by mouth 2 (two) times daily. Please make yearly appt with Dr. Caryl Comes for January 2022 for future refills. Thank you 1st attempt 180 tablet 1 08/14/2021 at 2300   OXYGEN Inhale 3-4 L/min into the lungs continuous.      potassium chloride SA (KLOR-CON M) 20 MEQ tablet Take 40 mEq by mouth at bedtime.   08/14/2021   rosuvastatin (CRESTOR) 5 MG tablet TAKE 1 TABLET DAILY (Patient taking differently: Take 5 mg by mouth at bedtime.) 90 tablet 3 08/14/2021   torsemide (DEMADEX) 20 MG tablet Take 4 tablets (80 mg total) by mouth daily. (Patient taking differently: Take 60 mg by mouth daily.) 360 tablet 3 08/14/2021   apixaban (ELIQUIS) 5 MG TABS tablet Take 1 tablet (5 mg total) by mouth 2 (two) times daily for 1 day. (Patient not taking: Reported on 08/15/2021) 180 tablet 2 Completed Course   hydrocortisone 2.5 % cream Apply topically 2 (two) times daily. (Patient not taking: Reported on 08/15/2021) 30 g 0 Not Taking   potassium chloride SA (KLOR-CON M20) 20 MEQ tablet Take 2 tablets (40 mEq total) by mouth every morning AND 1 tablet (20 mEq total) every evening.  (Patient not taking: Reported on 08/15/2021) 270 tablet 3 Not Taking   triamcinolone cream (KENALOG) 0.5 % Apply 1 application topically 3 (three) times daily. (Patient not taking: Reported on 08/15/2021)   Not Taking    Assessment: 53 YOM with h/o AFib on Eliquis at home to start IV heparin. Of note, patient reports not taking his Eliquis at home. Will check an aPTT and HL  H/H down, Plt down. SCr elevated   Goal of Therapy:  Heparin level 0.3-0.7 units/ml aPTT 66-102 seconds Monitor platelets by anticoagulation protocol: Yes   Plan:  -Start heparin infusion at 1300 units/hr -F/u 6 hr HL/aPTT -Monitor for s/s of bleeding   Albertina Parr, PharmD., BCPS, BCCCP Clinical Pharmacist Please refer to [facility] for unit-specific  pharmacist

## 2021-08-16 NOTE — Progress Notes (Signed)
NAME:  Sean Riley, MRN:  [id number], DOB:  August 08, 1951, LOS: 1 ADMISSION DATE:  08/15/2021, CONSULTATION DATE:  08/15/2020 REFERRING MD:  Sean Riley , CHIEF COMPLAINT:  Confusion   History of Present Illness:  Patient is encephalopathic. Therefore history has been obtained from chart review and wife Sean Riley.  Sean Riley, is a 71 y.o. male, who presented to the [facility] ED with a chief complaint of confusion, cough, and SOB  They have a pertinent past medical history of COPD on home O2, afib on eliquis, CHF, ascending aortic dissection s/p repair, AVR on 03/24/2017, HTN, GIB, GERD  Per Sean Riley (Wife), the patient was feeling unwell over the past 2-3 days with generalized fatigue, fever, slight cough. The morning of 1/6 the patient was confused and difficult to arouse. EMS was called.   In the ED he was found to be hypoxic with sats of 83% on 4L on arrival. He was started on BiPAP. Initial ABG 7.45/37/75/26. He was found to be febrile with a tmax 102.1. BP 86/64 in ED. A code sepsis was called. UA, BC ordered. Vanc, cefepime, flagyl started. Possible LLL pneumonia on CXR. Red LLE. +1 edema to BLLE. WBC 11.3.  PCCM was consulted for admission.      Pertinent  Medical History  COPD on home O2, afib on eliquis, CHF, ascending aortic dissection s/p repair, AVR on 03/24/2017, HTN, GIB, GERD  Significant Hospital Events: Including procedures, antibiotic start and stop dates in addition to other pertinent events   1/6 presented to [facility] ED, Vanc, cefepime, flagy, BC>, UC>, ECHO  Interim History / Subjective:  Looking better, more awake. Growing E faecalis in blood.  Objective   Blood pressure (!) 105/49, pulse 64, temperature 97.7 F (36.5 C), temperature source Oral, resp. rate (!) 24, height 6\' 1"  (1.854 m), weight 86.8 kg, SpO2 95 %.    Vent Mode: BIPAP;PCV FiO2 (%):  [40 %-60 %] 40 % Set Rate:  [8 bmp] 8 bmp PEEP:  [6 cmH20-10 cmH20] 6 cmH20 Pressure Support:  [5 cmH20] 5 cmH20   Intake/Output  Summary (Last 24 hours) at 08/16/2021 4825 Last data filed at 08/16/2021 0800 Gross per 24 hour  Intake 1657.8 ml  Output 1120 ml  Net 537.8 ml    Filed Weights   08/15/21 1636 08/16/21 0500  Weight: 86.2 kg 86.8 kg    Examination: No distress on BIPAP, wants to eat LLE is actually improved from baseline: per family was responding to topical steroids Lungs diminished at bases, no accessory muscle use Answering questions appropriately  Sodium improved AKI improved WBC up slightly E faecalis in blood, echo performed  Resolved Hospital Problem list     Assessment & Plan:  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 AKI due to above- improved with IVF Septic encephalopathy- improved Acute on chronic hypoxemic resp failure- seems improved Chronic pleural effusions COPD on HOT Afib on eliquis Previous AVR and TAA repair 2018 Hx HTN, GIB, GERD Hyponatremia- acute on chronic  Overall improving, ID to see regarding E faecalis bacteremia, f/u TTE and may need TEE Abx to ampicillin, duration TBD Reorder PTA triamcinalone for LLE Okay to get off BIPAP, start diet Gentle IV hydration IS, flutter Stop IV steroids Heparin gtt for now, transition back to eliquis prior to DC If looks okay this afternoon can go to progressive, appreciate TRH taking over starting 08/17/20  Best Practice (right click and "Reselect all SmartList Selections" daily)   Diet/type: start diet  DVT prophylaxis: SCD GI prophylaxis: PPI Lines: N/A Foley:  Yes, and it is still needed Code Status:  full code Last date of multidisciplinary goals of care discussion [Full scope. Discussed with wife at bedside 1/7]  Sean Emery MD PCCM

## 2021-08-16 NOTE — Progress Notes (Addendum)
Sean Riley for heparin Indication: atrial fibrillation  Allergies  Allergen Reactions   Simvastatin Other (See Comments)    Mental status changes   Celecoxib Other (See Comments)    GI UPSET AND INFLAMMATION   Codeine Other (See Comments)    GI UPSET AND INFLAMMATION   Nsaids Other (See Comments)    GI UPSET AND INFLAMMATION (can tolerate via IV)   Tape Other (See Comments)    Medical tape and Band-Aids PULL OFF THE SKIN; please use Coban wrap   Cephalexin Itching and Rash   Latex Rash    Patient Measurements: Height: 6\' 1"  (185.4 cm) Weight: 86.8 kg (191 lb 5.8 oz) IBW/kg (Calculated) : 79.9 Heparin Dosing Weight: TBW   Vital Signs: Temp: 96.8 F (36 C) (01/07 1300) Temp Source: Oral (01/07 0733) BP: 105/49 (01/07 0840) Pulse Rate: 74 (01/07 1300)  Labs: Recent Labs    08/15/21 1650 08/15/21 1743 08/16/21 0149 08/16/21 0440 08/16/21 0601 08/16/21 1221  HGB 12.1*   < > 10.7* 11.6* 11.0*  --   HCT 37.1*   < > 34.2* 34.0* 36.7*  --   PLT 189  --  137*  --  147*  --   APTT  --   --  40*  --   --  67*  LABPROT 19.0*  --   --   --   --   --   INR 1.6*  --   --   --   --   --   HEPARINUNFRC  --   --   --   --   --  >1.10*  CREATININE 1.94*  --  1.61*  --   --   --    < > = values in this interval not displayed.     Estimated Creatinine Clearance: 47.6 mL/min (A) (by C-G formula based on SCr of 1.61 mg/dL (H)).   Medical History: 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 LUTS    Hypertension    Microscopic hematuria    negative cystoscopy   NONSPECIFIC ABNORMAL ELECTROCARDIOGRAM    Pleural effusion on right    Pleural effusion, bilateral    S/P  aortic dissection repair 03/24/2017   Biological Bentall aortic root replacement + resection and grafting of entire ascending aorta, transverse aortic arch and proximal descending thoracic aorta with elephant trunk distal anastomosis and debranching of aortic arch vessels   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 54 g 3 08/14/2021   ALPRAZolam (XANAX) 1 MG tablet TAKE 1 TABLET BY MOUTH AT BEDTIME. (Patient taking differently: Take 1 mg by mouth at bedtime.) 30 tablet 3 08/14/2021   apixaban (ELIQUIS) 5 MG TABS  tablet Take 5 mg by mouth 2 (two) times daily.   08/14/2021   augmented betamethasone dipropionate (DIPROLENE-AF) 0.05 % cream APPLY TOPICALLY 2 TIMES DAILY AS NEEDED. (Patient taking differently: Apply 1 application topically 2 (two) times daily as needed (itching).) 50 g 1 Past Week   bisacodyl (DULCOLAX) 5 MG EC tablet Take 5 mg by mouth daily as needed for moderate constipation.   unk   Budeson-Glycopyrrol-Formoterol (BREZTRI AEROSPHERE) 160-9-4.8 MCG/ACT AERO Inhale 2 puffs into the lungs in the morning and at bedtime. 32.1 g 3 08/14/2021   diphenhydrAMINE (BENADRYL) 25 MG tablet Take 25 mg by mouth daily as needed (allergies/runny nose).   08/11/2021   escitalopram (LEXAPRO) 10 MG tablet TAKE 1 TABLET DAILY (Patient taking differently: Take 10 mg by mouth daily.) 90 tablet 3 08/14/2021   esomeprazole (NEXIUM) 40 MG capsule Take 1 capsule (40 mg total) by mouth daily before breakfast. 90 capsule 3 08/14/2021   flecainide (TAMBOCOR) 50 MG tablet TAKE  ONE AND ONE-HALF TABLETS TWICE A DAY (Patient taking differently: Take 75 mg by mouth 2 (two) times daily.) 270 tablet 2 08/14/2021   fluticasone (FLONASE) 50 MCG/ACT nasal spray Place 1 spray into both nostrils 2 (two) times daily as needed for allergies. Use one spray in each nostril twice daily.   Past Month   hydroxypropyl methylcellulose / hypromellose (ISOPTO TEARS / GONIOVISC) 2.5 % ophthalmic solution Place 1-2 drops into both eyes 3 (three) times daily as needed (dry/irritated eyes).   unk   ipratropium-albuterol (DUONEB) 0.5-2.5 (3) MG/3ML SOLN Take 3 mLs by nebulization every 4 (four) hours as needed. (Patient taking differently: Take 3 mLs by nebulization every 4 (four) hours as needed (shortness of breath).) 360 mL 11 Past Month   JARDIANCE 10 MG TABS tablet TAKE 1 TABLET DAILY BEFORE BREAKFAST (Patient taking differently: Take 10 mg by mouth daily.) 90 tablet 3 08/14/2021   magnesium hydroxide (MILK OF MAGNESIA) 400 MG/5ML suspension Take 7.5 mLs by mouth daily as needed (stomach cramps/constipation).   unk   metoprolol tartrate (LOPRESSOR) 25 MG tablet Take 0.5 tablets (12.5 mg total) by mouth 2 (two) times daily. Please make yearly appt with Dr. Caryl Comes for January 2022 for future refills. Thank you 1st attempt 180 tablet 1 08/14/2021 at 2300   OXYGEN Inhale 3-4 L/min into the lungs continuous.      potassium chloride SA (KLOR-CON M) 20 MEQ tablet Take 40 mEq by mouth at bedtime.   08/14/2021   rosuvastatin (CRESTOR) 5 MG tablet TAKE 1 TABLET DAILY (Patient taking differently: Take 5 mg by mouth at bedtime.) 90 tablet 3 08/14/2021   torsemide (DEMADEX) 20 MG tablet Take 4 tablets (80 mg total) by mouth daily. (Patient taking differently: Take 60 mg by mouth daily.) 360 tablet 3 08/14/2021   apixaban (ELIQUIS) 5 MG TABS tablet Take 1 tablet (5 mg total) by mouth 2 (two) times daily for 1 day. (Patient not taking: Reported on 08/15/2021) 180 tablet 2 Completed Course   hydrocortisone 2.5 % cream Apply  topically 2 (two) times daily. (Patient not taking: Reported on 08/15/2021) 30 g 0 Not Taking   potassium chloride SA (KLOR-CON M20) 20 MEQ tablet Take 2 tablets (40 mEq total) by mouth every morning AND 1 tablet (20 mEq total) every evening. (Patient not taking: Reported on 08/15/2021) 270 tablet 3 Not Taking   triamcinolone cream (KENALOG) 0.5 % Apply 1 application topically 3 (three) times daily. (Patient not taking: Reported on 08/15/2021)   Not Taking  Assessment: Sean Riley with h/o AFib on Eliquis at home to start IV heparin. Of note, patient reports not taking his Eliquis at home; however, baseline heparin level is elevated >1.1 as you would expect when taking. Initial aPTT therapeutic at 67 seconds but at bottom of range. Hg down to 11, plt down to 147. SCr improved today. No bleeding or issues with infusion per discussion with RN.  Goal of Therapy:  Heparin level 0.3-0.7 units/ml aPTT 66-102 seconds Monitor platelets by anticoagulation protocol: Yes   Plan:  Increase heparin infusion slightly to 1350 units/hr to ensure stays in range Recheck next aPTT with AM labs Monitor daily heparin level and aPTT until correlating, CBC, s/sx bleeding F/u resume PTA apixaban as appropriate   Arturo Morton, PharmD, BCPS Please check AMION for all [place] contact numbers Clinical Pharmacist 08/16/2021 3:07 PM

## 2021-08-16 NOTE — Progress Notes (Signed)
Pt transported from 3M11 to CT and back by RT, NT and RN with no complications.

## 2021-08-16 NOTE — Consult Note (Signed)
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 Atrial fibrillation currently on a heparin CHF COPD on home oxygen Steroid responsive rash on his left lower extremity  Plan:  Agree with narrow to ampicillin Follow-up results of 2D echocardiogram and ultimately he is going to need a transesophageal echocardiogram to ensure that his valves are aggressively evaluated (unless the procedure would be too risky in him from pulmonary standpoint Will obtain CT of the abdomen (preferably with contrast) but will wait for renal function to come back to normal  Principal Problem:   Enterococcal bacteremia Active Problems:   Sepsis ([place] [facility])   Scheduled Meds:  arformoterol  15 mcg Nebulization BID   budesonide (PULMICORT) nebulizer solution  0.25 mg Nebulization BID   chlorhexidine  15 mL Mouth Rinse BID   Chlorhexidine Gluconate Cloth  6 each Topical Daily   EPINEPHrine       insulin aspart  0-15 Units Subcutaneous Q4H   mouth rinse  15 mL Mouth Rinse q12n4p   pantoprazole (PROTONIX) IV  40 mg Intravenous QHS   revefenacin  175 mcg Nebulization Daily   triamcinolone cream   Topical BID   Continuous Infusions:  sodium chloride Stopped (08/16/21 0004)   ampicillin (OMNIPEN) IV 2 g (08/16/21 0930)   heparin 1,300 Units/hr (08/16/21 0600)   lactated ringers 75 mL/hr at 08/16/21 0600   PRN Meds:.albuterol, docusate sodium, ondansetron (ZOFRAN) IV, perflutren lipid microspheres (DEFINITY) IV suspension, polyethylene glycol  HPI: Sean Riley is a 71 y.o. male with history of COPD on home oxygen atrial fibrillation CHF ascending aortic dissection status postrepair and aortic valve  replacement in August 2018 hypertension history of GI bleed gastroesophageal reflux disease who had been feeling unwell 2 to 3 days prior to hospitalization with fatigue fevers cough.  The morning of January 6 the patient was confused and difficult to arouse and his wife called EMS.  In the ER he was hypoxic to 83% and required BiPAP.  He was febrile to 102.1 and hypotensive.  Blood cultures were taken urine analysis and cultures taken chest x-ray revealed a possible left lower lobe pneumonia.  He was started on vancomycin and cefepime and Flagyl.  In the interim his blood cultures were positive for Enterococcus faecalis.  He has now been narrowed to ampicillin.  He is improved with antibiotics and supportive care in the ICU.  Today's alert and oriented and in no acute distress.  He does have a loud murmur at the right upper sternal border with radiation to his carotid.  Certainly we need to work him up for prosthetic and/or native valve endocarditis.  2D echocardiogram has been performed but not yet read.  If this is unrevealing would like to pursue transesophageal echocardiogram and Unless there is some contraindication, for example if his chronic COPD would make sedation dangerous for TEE.  If that is the case we could certainly go with empiric treatment.  If he has endocarditis or we go with empiric treatment he will need dual beta-lactam therapy with ampicillin and high-dose ceftriaxone.  Repeating his blood cultures to ensure he is clearing this infection.  Is not clear what the source was given he is not had urinary  symptoms or GI symptoms.  For thoroughness I would get a CT of the abdomen pelvis but I will wait for now since it is not urgent and his renal function is still not optimal.  I spent 82 minutes with the patient including than 50% of the time in face to face counseling of the patient and his wife regarding the nature of enterococcal bacteremia, its propensity to cause  native and prosthetic valve endocarditis, personally reviewing his chest x-ray CBC BMP lactic acid ABG updated cultures along with review of medical records in preparation for the visit and during the visit and in coordination of his care.    Review of Systems: Review of Systems  Constitutional:  Positive for chills and malaise/fatigue.  HENT:  Negative for ear discharge, ear pain, hearing loss, nosebleeds and tinnitus.   Eyes:  Negative for blurred vision, double vision and photophobia.  Respiratory:  Positive for cough and shortness of breath.   Cardiovascular:  Positive for palpitations. Negative for chest pain.  Genitourinary:  Negative for dysuria, flank pain, frequency, hematuria and urgency.  Musculoskeletal:  Negative for back pain, joint pain and neck pain.  Skin:  Positive for rash.  Neurological:  Positive for weakness.  Psychiatric/Behavioral:  Negative for hallucinations and substance abuse. The patient is not nervous/anxious and does not have insomnia.    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 LUTS    Hypertension    Microscopic hematuria    negative cystoscopy   NONSPECIFIC ABNORMAL ELECTROCARDIOGRAM    Pleural effusion on right    Pleural effusion, bilateral    S/P aortic dissection repair 03/24/2017   Biological Bentall aortic root replacement + resection and grafting of entire ascending aorta, transverse aortic arch and proximal descending thoracic aorta with elephant trunk distal anastomosis and debranching of aortic arch vessels   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   25 mm Edwards Magna Ease bovine pericardial tissue valve and 28 mm Gelweave Valsalva aortic  root graft with reimplantation of left main and right coronary arteries   Varicose veins     Social History   Tobacco Use   Smoking status: Former    Packs/day: 2.00    Years: 45.00    Pack years: 90.00    Types: Cigarettes    Quit date: 03/22/2017    Years since quitting: 4.4   Smokeless tobacco: Never   Tobacco comments:    2 ppd , quit 03/2017  Vaping Use   Vaping Use: Never used  Substance Use Topics   Alcohol use: Not Currently   Drug use: No    Family History  Problem Relation Age of Onset   Hypertension Mother    Hypertension Father    Benign prostatic hyperplasia Father        S/P TURP   Heart attack Maternal Grandmother        MI in 13s   Breast cancer Maternal Grandmother    Arrhythmia Brother         X 2   Heart attack Maternal Aunt        MI in 30s   Stroke Neg Hx  Diabetes Neg Hx    Colon cancer Neg Hx    Colon polyps Neg Hx    Stomach cancer Neg Hx    Rectal cancer Neg Hx    Esophageal cancer Neg Hx    Prostate cancer Neg Hx    Other Neg Hx        gynecomastia   Allergies  Allergen Reactions   Simvastatin Other (See Comments)    Mental status changes   Celecoxib Other (See Comments)    GI UPSET AND INFLAMMATION   Codeine Other (See Comments)    GI UPSET AND INFLAMMATION   Nsaids Other (See Comments)    GI UPSET AND INFLAMMATION (can tolerate via IV)   Tape Other (See Comments)    Medical tape and Band-Aids PULL OFF THE SKIN; please use Coban wrap   Cephalexin Itching and Rash   Latex Rash    OBJECTIVE: Blood pressure (!) 105/49, pulse 70, temperature (!) 96.6 F (35.9 C), resp. rate 18, height 6\' 1"  (1.854 m), weight 86.8 kg, SpO2 96 %.  Physical Exam Constitutional:      Appearance: He is well-developed.  HENT:     Head: Normocephalic and atraumatic.  Eyes:     Extraocular Movements: Extraocular movements intact.     Conjunctiva/sclera: Conjunctivae normal.  Cardiovascular:     Rate and Rhythm: Tachycardia present. Rhythm  irregular.     Heart sounds: Murmur heard.  Pulmonary:     Effort: Pulmonary effort is normal. No respiratory distress.     Breath sounds: No stridor. Rhonchi present. No wheezing.  Abdominal:     General: There is no distension.     Palpations: Abdomen is soft.  Musculoskeletal:        General: No tenderness. Normal range of motion.     Cervical back: Normal range of motion and neck supple.  Skin:    General: Skin is warm and dry.     Coloration: Skin is not pale.     Findings: Rash present. No erythema.  Neurological:     General: No focal deficit present.     Mental Status: He is alert and oriented to person, place, and time.  Psychiatric:        Mood and Affect: Mood normal.        Behavior: Behavior normal.        Thought Content: Thought content normal.        Judgment: Judgment normal.   He has a chronic rash on his left lower extremity  Lab Results Lab Results  Component Value Date   WBC 13.8 (H) 08/16/2021   HGB 11.0 (L) 08/16/2021   HCT 36.7 (L) 08/16/2021   MCV 86.6 08/16/2021   PLT 147 (L) 08/16/2021    Lab Results  Component Value Date   CREATININE 1.61 (H) 08/16/2021   BUN 27 (H) 08/16/2021   NA 133 (L) 08/16/2021   K 4.3 08/16/2021   CL 98 08/16/2021   CO2 24 08/16/2021    Lab Results  Component Value Date   ALT 36 08/15/2021   AST 68 (H) 08/15/2021   ALKPHOS 119 08/15/2021   BILITOT 1.4 (H) 08/15/2021     Microbiology: Recent Results (from the past 240 hour(s))  Culture, blood (Routine x 2)     Status: None (Preliminary result)   Collection Time: 08/15/21  4:50 PM   Specimen: BLOOD  Result Value Ref Range Status   Specimen Description BLOOD SITE NOT SPECIFIED  Final   Special  Requests   Final    BOTTLES DRAWN AEROBIC AND ANAEROBIC Blood Culture results may not be optimal due to an excessive volume of blood received in culture bottles   Culture  Setup Time   Final    GRAM POSITIVE COCCI IN BOTH AEROBIC AND ANAEROBIC BOTTLES CRITICAL VALUE  NOTED.  VALUE IS CONSISTENT WITH PREVIOUSLY REPORTED AND CALLED VALUE. Performed at [facility], [place] [address], [place], [place] [address]    Culture GRAM POSITIVE COCCI  Final   Report Status PENDING  Incomplete  Resp Panel by RT-PCR (Flu A&B, Covid) Peripheral     Status: None   Collection Time: 08/15/21  4:51 PM   Specimen: Peripheral; Nasopharyngeal(NP) swabs in vial transport medium  Result Value Ref Range Status   SARS Coronavirus 2 by RT PCR NEGATIVE NEGATIVE Final    Comment: (NOTE) SARS-CoV-2 target nucleic acids are NOT DETECTED.  The SARS-CoV-2 RNA is generally detectable in upper respiratory specimens during the acute phase of infection. The lowest concentration of SARS-CoV-2 viral copies this assay can detect is 138 copies/mL. A negative result does not preclude SARS-Cov-2 infection and should not be used as the sole basis for treatment or other patient management decisions. A negative result may occur with  improper specimen collection/handling, submission of specimen other than nasopharyngeal swab, presence of viral mutation(s) within the areas targeted by this assay, and inadequate number of viral copies(<138 copies/mL). A negative result must be combined with clinical observations, patient history, and epidemiological information. The expected result is Negative.  Fact Sheet for Patients:  EntrepreneurPulse.com.au  Fact Sheet for Healthcare Providers:  IncredibleEmployment.be  This test is no t yet approved or cleared by the [place] FDA and  has been authorized for detection and/or diagnosis of SARS-CoV-2 by FDA under an Emergency Use Authorization (EUA). This EUA will remain  in effect (meaning this test can be used) for the duration of the COVID-19 declaration under Section 564(b)(1) of the Act, 21 U.S.C.section 360bbb-3(b)(1), unless the authorization is terminated  or revoked sooner.       Influenza A by  PCR NEGATIVE NEGATIVE Final   Influenza B by PCR NEGATIVE NEGATIVE Final    Comment: (NOTE) The Xpert Xpress SARS-CoV-2/FLU/RSV plus assay is intended as an aid in the diagnosis of influenza from Nasopharyngeal swab specimens and should not be used as a sole basis for treatment. Nasal washings and aspirates are unacceptable for Xpert Xpress SARS-CoV-2/FLU/RSV testing.  Fact Sheet for Patients: EntrepreneurPulse.com.au  Fact Sheet for Healthcare Providers: IncredibleEmployment.be  This test is not yet approved or cleared by the [place] FDA and has been authorized for detection and/or diagnosis of SARS-CoV-2 by FDA under an Emergency Use Authorization (EUA). This EUA will remain in effect (meaning this test can be used) for the duration of the COVID-19 declaration under Section 564(b)(1) of the Act, 21 U.S.C. section 360bbb-3(b)(1), unless the authorization is terminated or revoked.  Performed at [facility], [place] [address], [place], [place] [address]   Culture, blood (Routine x 2)     Status: None (Preliminary result)   Collection Time: 08/15/21  5:02 PM   Specimen: BLOOD  Result Value Ref Range Status   Specimen Description BLOOD SITE NOT SPECIFIED  Final   Special Requests   Final    BOTTLES DRAWN AEROBIC AND ANAEROBIC Blood Culture results may not be optimal due to an excessive volume of blood received in culture bottles   Culture  Setup Time  Final    GRAM POSITIVE COCCI IN CHAINS IN BOTH AEROBIC AND ANAEROBIC BOTTLES CRITICAL RESULT CALLED TO, READ BACK BY AND VERIFIED WITHJiles Garter [facility] PHARMD @0817  08/16/21 EB Performed at [facility], [place] [address], [place], [place] [address]    Culture GRAM POSITIVE COCCI  Final   Report Status PENDING  Incomplete  Blood Culture ID Panel (Reflexed)     Status: Abnormal   Collection Time: 08/15/21  5:02 PM  Result Value Ref Range Status   Enterococcus faecalis DETECTED (A) NOT  DETECTED Final    Comment: CRITICAL RESULT CALLED TO, READ BACK BY AND VERIFIED WITH: Jiles Garter DOHL[facility] PHARMD @0817  08/16/21 EB    Enterococcus Faecium NOT DETECTED NOT DETECTED Final   Listeria monocytogenes NOT DETECTED NOT DETECTED Final   Staphylococcus species NOT DETECTED NOT DETECTED Final   Staphylococcus aureus (BCID) NOT DETECTED NOT DETECTED Final   Staphylococcus epidermidis NOT DETECTED NOT DETECTED Final   Staphylococcus lugdunensis NOT DETECTED NOT DETECTED Final   Streptococcus species NOT DETECTED NOT DETECTED Final   Streptococcus agalactiae NOT DETECTED NOT DETECTED Final   Streptococcus pneumoniae NOT DETECTED NOT DETECTED Final   Streptococcus pyogenes NOT DETECTED NOT DETECTED Final   A.calcoaceticus-baumannii NOT DETECTED NOT DETECTED Final   Bacteroides fragilis NOT DETECTED NOT DETECTED Final   Enterobacterales NOT DETECTED NOT DETECTED Final   Enterobacter cloacae complex NOT DETECTED NOT DETECTED Final   Escherichia coli NOT DETECTED NOT DETECTED Final   Klebsiella aerogenes NOT DETECTED NOT DETECTED Final   Klebsiella oxytoca NOT DETECTED NOT DETECTED Final   Klebsiella pneumoniae NOT DETECTED NOT DETECTED Final   Proteus species NOT DETECTED NOT DETECTED Final   Salmonella species NOT DETECTED NOT DETECTED Final   Serratia marcescens NOT DETECTED NOT DETECTED Final   Haemophilus influenzae NOT DETECTED NOT DETECTED Final   Neisseria meningitidis NOT DETECTED NOT DETECTED Final   Pseudomonas aeruginosa NOT DETECTED NOT DETECTED Final   Stenotrophomonas maltophilia NOT DETECTED NOT DETECTED Final   Candida albicans NOT DETECTED NOT DETECTED Final   Candida auris NOT DETECTED NOT DETECTED Final   Candida glabrata NOT DETECTED NOT DETECTED Final   Candida krusei NOT DETECTED NOT DETECTED Final   Candida parapsilosis NOT DETECTED NOT DETECTED Final   Candida tropicalis NOT DETECTED NOT DETECTED Final   Cryptococcus neoformans/gattii NOT DETECTED NOT DETECTED  Final   Vancomycin resistance NOT DETECTED NOT DETECTED Final    Comment: Performed at [facility], [place] [address], [place], [place] [address]  MRSA Next Gen by PCR, Nasal     Status: None   Collection Time: 08/15/21 11:30 PM   Specimen: Nasal Mucosa; Nasal Swab  Result Value Ref Range Status   MRSA by PCR Next Gen NOT DETECTED NOT DETECTED Final    Comment: (NOTE) The GeneXpert MRSA Assay (FDA approved for NASAL specimens only), is one component of a comprehensive MRSA colonization surveillance program. It is not intended to diagnose MRSA infection nor to guide or monitor treatment for MRSA infections. Test performance is not FDA approved in patients less than 17 years old. Performed at [facility], [place] [address] [place], [place] [address]     Alcide Evener, [facility] [phone] pager  08/16/2021, 11:58 AM

## 2021-08-17 ENCOUNTER — Inpatient Hospital Stay (HOSPITAL_COMMUNITY): Payer: Medicare Other

## 2021-08-17 DIAGNOSIS — T826XXD Infection and inflammatory reaction due to cardiac valve prosthesis, subsequent encounter: Secondary | ICD-10-CM

## 2021-08-17 DIAGNOSIS — B952 Enterococcus as the cause of diseases classified elsewhere: Secondary | ICD-10-CM | POA: Diagnosis not present

## 2021-08-17 DIAGNOSIS — A409 Streptococcal sepsis, unspecified: Secondary | ICD-10-CM | POA: Diagnosis not present

## 2021-08-17 DIAGNOSIS — J441 Chronic obstructive pulmonary disease with (acute) exacerbation: Secondary | ICD-10-CM

## 2021-08-17 DIAGNOSIS — I4811 Longstanding persistent atrial fibrillation: Secondary | ICD-10-CM | POA: Diagnosis not present

## 2021-08-17 DIAGNOSIS — R7881 Bacteremia: Secondary | ICD-10-CM | POA: Diagnosis not present

## 2021-08-17 DIAGNOSIS — I38 Endocarditis, valve unspecified: Secondary | ICD-10-CM

## 2021-08-17 LAB — GLUCOSE, CAPILLARY
Glucose-Capillary: 91 mg/dL (ref 70–99)
Glucose-Capillary: 110 mg/dL — ABNORMAL HIGH (ref 70–99)
Glucose-Capillary: 124 mg/dL — ABNORMAL HIGH (ref 70–99)
Glucose-Capillary: 142 mg/dL — ABNORMAL HIGH (ref 70–99)
Glucose-Capillary: 97 mg/dL (ref 70–99)
Glucose-Capillary: 114 mg/dL — ABNORMAL HIGH (ref 70–99)

## 2021-08-17 LAB — BASIC METABOLIC PANEL
Anion gap: 9 (ref 5–15)
BUN: 24 mg/dL — ABNORMAL HIGH (ref 8–23)
CO2: 25 mmol/L (ref 22–32)
Calcium: 8.2 mg/dL — ABNORMAL LOW (ref 8.9–10.3)
Chloride: 100 mmol/L (ref 98–111)
Creatinine, Ser: 1.19 mg/dL (ref 0.61–1.24)
GFR, Estimated: >60 mL/min (ref >60)
Glucose, Bld: 132 mg/dL — ABNORMAL HIGH (ref 70–99)
Potassium: 3.4 mmol/L — ABNORMAL LOW (ref 3.5–5.1)
Sodium: 134 mmol/L — ABNORMAL LOW (ref 135–145)

## 2021-08-17 LAB — CBC
HCT: 34 % — ABNORMAL LOW (ref 39.0–52.0)
Hemoglobin: 11 g/dL — ABNORMAL LOW (ref 13.0–17.0)
MCH: 26.6 pg (ref 26.0–34.0)
MCHC: 32.4 g/dL (ref 30.0–36.0)
MCV: 82.1 fL (ref 80.0–100.0)
Platelets: 180 10*3/uL (ref 150–400)
RBC: 4.14 MIL/uL — ABNORMAL LOW (ref 4.22–5.81)
RDW: 15.9 % — ABNORMAL HIGH (ref 11.5–15.5)
WBC: 9.1 10*3/uL (ref 4.0–10.5)
nRBC: 0 % (ref 0.0–0.2)

## 2021-08-17 LAB — URINE CULTURE: Culture: NO GROWTH

## 2021-08-17 LAB — APTT
aPTT: 75 seconds — ABNORMAL HIGH (ref 24–36)
aPTT: 74 seconds — ABNORMAL HIGH (ref 24–36)

## 2021-08-17 LAB — MAGNESIUM: Magnesium: 2.1 mg/dL (ref 1.7–2.4)

## 2021-08-17 LAB — PROCALCITONIN: Procalcitonin: 0.51 ng/mL

## 2021-08-17 LAB — HEPARIN LEVEL (UNFRACTIONATED)
Heparin Unfractionated: 0.69 IU/mL (ref 0.30–0.70)
Heparin Unfractionated: 0.62 IU/mL (ref 0.30–0.70)

## 2021-08-17 LAB — SEDIMENTATION RATE: Sed Rate: 15 mm/hr (ref 0–16)

## 2021-08-17 LAB — C-REACTIVE PROTEIN: CRP: 9 mg/dL — ABNORMAL HIGH (ref <1.0)

## 2021-08-17 MED ORDER — POLYVINYL ALCOHOL 1.4 % OP SOLN
1.0000 [drp] | Freq: Three times a day (TID) | OPHTHALMIC | Status: DC | PRN
  Administered 2021-08-17: 1 [drp] via OPHTHALMIC
  Filled 2021-08-17: qty 15

## 2021-08-17 MED ORDER — ALPRAZOLAM 0.5 MG PO TABS
1.0000 mg | ORAL_TABLET | Freq: Every day | ORAL | Status: DC
  Administered 2021-08-17 – 2021-08-21 (×5): 1 mg via ORAL
  Filled 2021-08-17 (×5): qty 2

## 2021-08-17 MED ORDER — PANTOPRAZOLE SODIUM 40 MG PO TBEC
40.0000 mg | DELAYED_RELEASE_TABLET | Freq: Every day | ORAL | Status: DC
  Administered 2021-08-17 – 2021-08-21 (×5): 40 mg via ORAL
  Filled 2021-08-17 (×5): qty 1

## 2021-08-17 MED ORDER — METOPROLOL TARTRATE 12.5 MG HALF TABLET
12.5000 mg | ORAL_TABLET | Freq: Two times a day (BID) | ORAL | Status: DC
  Administered 2021-08-17 – 2021-08-22 (×9): 12.5 mg via ORAL
  Filled 2021-08-17 (×9): qty 1

## 2021-08-17 MED ORDER — MAGNESIUM HYDROXIDE 400 MG/5ML PO SUSP: 7.5000 mL | Freq: Every day | ORAL | Status: DC | PRN

## 2021-08-17 MED ORDER — IOHEXOL 300 MG/ML  SOLN
100.0000 mL | Freq: Once | INTRAMUSCULAR | Status: AC | PRN
  Administered 2021-08-17: 100 mL via INTRAVENOUS

## 2021-08-17 MED ORDER — FLECAINIDE ACETATE 50 MG PO TABS
75.0000 mg | ORAL_TABLET | Freq: Two times a day (BID) | ORAL | Status: DC
  Administered 2021-08-17 – 2021-08-22 (×10): 75 mg via ORAL
  Filled 2021-08-17 (×11): qty 2

## 2021-08-17 MED ORDER — UMECLIDINIUM-VILANTEROL 62.5-25 MCG/ACT IN AEPB
1.0000 | INHALATION_SPRAY | Freq: Every day | RESPIRATORY_TRACT | Status: DC
  Administered 2021-08-18 – 2021-08-22 (×4): 1 via RESPIRATORY_TRACT
  Filled 2021-08-17: qty 14

## 2021-08-17 MED ORDER — UMECLIDINIUM BROMIDE 62.5 MCG/ACT IN AEPB
1.0000 | INHALATION_SPRAY | Freq: Every day | RESPIRATORY_TRACT | Status: DC
  Filled 2021-08-17: qty 7

## 2021-08-17 MED ORDER — ORAL CARE MOUTH RINSE
15.0000 mL | Freq: Two times a day (BID) | OROMUCOSAL | Status: DC
  Administered 2021-08-17 – 2021-08-22 (×11): 15 mL via OROMUCOSAL

## 2021-08-17 MED ORDER — FLUTICASONE PROPIONATE 50 MCG/ACT NA SUSP
1.0000 | Freq: Two times a day (BID) | NASAL | Status: DC | PRN
  Filled 2021-08-17: qty 16

## 2021-08-17 MED ORDER — BISACODYL 5 MG PO TBEC: 5.0000 mg | DELAYED_RELEASE_TABLET | Freq: Every day | ORAL | Status: DC | PRN

## 2021-08-17 MED ORDER — ROSUVASTATIN CALCIUM 5 MG PO TABS
5.0000 mg | ORAL_TABLET | Freq: Every day | ORAL | Status: DC
  Administered 2021-08-17 – 2021-08-22 (×6): 5 mg via ORAL
  Filled 2021-08-17 (×6): qty 1

## 2021-08-17 MED ORDER — ALBUTEROL SULFATE HFA 108 (90 BASE) MCG/ACT IN AERS: 2.0000 | INHALATION_SPRAY | RESPIRATORY_TRACT | Status: DC | PRN

## 2021-08-17 MED ORDER — POTASSIUM CHLORIDE CRYS ER 20 MEQ PO TBCR
40.0000 meq | EXTENDED_RELEASE_TABLET | Freq: Once | ORAL | Status: AC
  Administered 2021-08-17: 40 meq via ORAL
  Filled 2021-08-17: qty 2

## 2021-08-17 MED ORDER — SODIUM CHLORIDE 0.9 % IV SOLN
2.0000 g | Freq: Two times a day (BID) | INTRAVENOUS | Status: DC
  Administered 2021-08-17 – 2021-08-22 (×10): 2 g via INTRAVENOUS
  Filled 2021-08-17 (×10): qty 20

## 2021-08-17 MED ORDER — FLUTICASONE FUROATE-VILANTEROL 100-25 MCG/ACT IN AEPB
1.0000 | INHALATION_SPRAY | Freq: Every day | RESPIRATORY_TRACT | Status: DC
  Filled 2021-08-17: qty 28

## 2021-08-17 MED ORDER — BUDESON-GLYCOPYRROL-FORMOTEROL 160-9-4.8 MCG/ACT IN AERO: 2.0000 | INHALATION_SPRAY | Freq: Every day | RESPIRATORY_TRACT | Status: DC

## 2021-08-17 MED ORDER — ESCITALOPRAM OXALATE 10 MG PO TABS
10.0000 mg | ORAL_TABLET | Freq: Every day | ORAL | Status: DC
  Administered 2021-08-17 – 2021-08-22 (×6): 10 mg via ORAL
  Filled 2021-08-17 (×6): qty 1

## 2021-08-17 NOTE — Progress Notes (Signed)
Pharmacy Electrolyte Replacement  Recent Labs:  Recent Labs    08/16/21 0149 08/16/21 0440 08/17/21 1031  K 3.7   < > 3.4*  MG 2.0  --  2.1  PHOS 3.9  --   --   CREATININE 1.61*  --  1.19   < > = values in this interval not displayed.    Low Critical Values (K </= 2.5, Phos </= 1, Mg </= 1) Present: None  MD Contacted: n/a - no critical values noted  Plan: KCl 38mEq PO x 1 dose Recheck BMET in AM per protocol   Arturo Morton, PharmD, BCPS Please check AMION for all [place] contact numbers Clinical Pharmacist 08/17/2021 2:12 PM

## 2021-08-17 NOTE — Progress Notes (Signed)
Subjective: No new complaints   Antibiotics:  Anti-infectives (From admission, onward)    Start     Dose/Rate Route Frequency Ordered Stop   08/16/21 2000  vancomycin (VANCOREADY) IVPB 1250 mg/250 mL  Status:  Discontinued        1,250 mg 166.7 mL/hr over 90 Minutes Intravenous Every 24 hours 08/15/21 1957 08/16/21 0844   08/16/21 0930  ampicillin (OMNIPEN) 2 g in sodium chloride 0.9 % 100 mL IVPB        2 g 300 mL/hr over 20 Minutes Intravenous Every 4 hours 08/16/21 0844     08/16/21 0600  ceFEPIme (MAXIPIME) 2 g in sodium chloride 0.9 % 100 mL IVPB  Status:  Discontinued        2 g 200 mL/hr over 30 Minutes Intravenous Every 12 hours 08/15/21 1957 08/16/21 0844   08/15/21 1715  vancomycin (VANCOREADY) IVPB 1750 mg/350 mL        1,750 mg 175 mL/hr over 120 Minutes Intravenous  Once 08/15/21 1711 08/15/21 2102   08/15/21 1700  ceFEPIme (MAXIPIME) 2 g in sodium chloride 0.9 % 100 mL IVPB        2 g 200 mL/hr over 30 Minutes Intravenous  Once 08/15/21 1655 08/15/21 1803   08/15/21 1700  metroNIDAZOLE (FLAGYL) IVPB 500 mg        500 mg 100 mL/hr over 60 Minutes Intravenous  Once 08/15/21 1655 08/15/21 1833   08/15/21 1700  vancomycin (VANCOCIN) IVPB 1000 mg/200 mL premix  Status:  Discontinued        1,000 mg 200 mL/hr over 60 Minutes Intravenous  Once 08/15/21 1655 08/15/21 1711       Medications: Scheduled Meds:  arformoterol  15 mcg Nebulization BID   budesonide (PULMICORT) nebulizer solution  0.25 mg Nebulization BID   Chlorhexidine Gluconate Cloth  6 each Topical Daily   insulin aspart  0-15 Units Subcutaneous Q4H   mouth rinse  15 mL Mouth Rinse BID   pantoprazole (PROTONIX) IV  40 mg Intravenous QHS   revefenacin  175 mcg Nebulization Daily   triamcinolone cream   Topical BID   Continuous Infusions:  sodium chloride Stopped (08/16/21 0004)   ampicillin (OMNIPEN) IV 2 g (08/17/21 1158)   heparin 1,350 Units/hr (08/17/21 1000)   PRN Meds:.albuterol,  docusate sodium, ondansetron (ZOFRAN) IV, polyethylene glycol    Objective: Weight change: -0.783 kg  Intake/Output Summary (Last 24 hours) at 08/17/2021 1409 Last data filed at 08/17/2021 1000 Gross per 24 hour  Intake 1448.6 ml  Output 1630 ml  Net -181.4 ml   Blood pressure (!) 121/54, pulse (!) 117, temperature (!) 97.5 F (36.4 C), resp. rate (!) 23, height 6\' 1"  (1.854 m), weight 85.4 kg, SpO2 93 %. Temp:  [96.8 F (36 C)-97.9 F (36.6 C)] 97.5 F (36.4 C) (01/08 1330) Pulse Rate:  [65-129] 117 (01/08 1330) Resp:  [14-29] 23 (01/08 1330) BP: (107-133)/(42-96) 121/54 (01/08 1300) SpO2:  [91 %-100 %] 93 % (01/08 1330) Weight:  [85.4 kg] 85.4 kg (01/08 0500)  Physical Exam: Physical Exam Constitutional:      Appearance: He is well-developed.  HENT:     Head: Normocephalic and atraumatic.  Eyes:     Conjunctiva/sclera: Conjunctivae normal.  Cardiovascular:     Rate and Rhythm: Tachycardia present. Rhythm irregular.     Heart sounds:    Gallop present.  Pulmonary:     Effort: Pulmonary effort is normal. No respiratory distress.  Breath sounds: Normal breath sounds. No stridor. No wheezing or rhonchi.  Abdominal:     General: Bowel sounds are normal. There is no distension.     Palpations: Abdomen is soft. There is no mass.     Hernia: A hernia is present.  Musculoskeletal:     Cervical back: Normal range of motion and neck supple.  Skin:    General: Skin is warm and dry.     Findings: No erythema.  Neurological:     General: No focal deficit present.     Mental Status: He is alert and oriented to person, place, and time.  Psychiatric:        Mood and Affect: Mood normal.        Behavior: Behavior normal.        Thought Content: Thought content normal.        Judgment: Judgment normal.     CBC:    BMET Recent Labs    08/16/21 0149 08/16/21 0440 08/17/21 1031  NA 133* 133* 134*  K 3.7 4.3 3.4*  CL 98  --  100  CO2 24  --  25  GLUCOSE 128*  --   132*  BUN 27*  --  24*  CREATININE 1.61*  --  1.19  CALCIUM 8.4*  --  8.2*     Liver Panel  Recent Labs    08/15/21 1650  PROT 7.2  ALBUMIN 3.2*  AST 68*  ALT 36  ALKPHOS 119  BILITOT 1.4*       Sedimentation Rate Recent Labs    08/17/21 0246  ESRSEDRATE 15   C-Reactive Protein Recent Labs    08/17/21 0246  CRP 9.0*    Micro Results: Recent Results (from the past 720 hour(s))  Culture, blood (Routine x 2)     Status: Abnormal (Preliminary result)   Collection Time: 08/15/21  4:50 PM   Specimen: BLOOD  Result Value Ref Range Status   Specimen Description BLOOD SITE NOT SPECIFIED  Final   Special Requests   Final    BOTTLES DRAWN AEROBIC AND ANAEROBIC Blood Culture results may not be optimal due to an excessive volume of blood received in culture bottles   Culture  Setup Time   Final    GRAM POSITIVE COCCI IN BOTH AEROBIC AND ANAEROBIC BOTTLES CRITICAL VALUE NOTED.  VALUE IS CONSISTENT WITH PREVIOUSLY REPORTED AND CALLED VALUE.    Culture (A)  Final    ENTEROCOCCUS FAECALIS CULTURE REINCUBATED FOR BETTER GROWTH Performed at [facility], [place] [address], [place], [place] [address]    Report Status PENDING  Incomplete  Resp Panel by RT-PCR (Flu A&B, Covid) Peripheral     Status: None   Collection Time: 08/15/21  4:51 PM   Specimen: Peripheral; Nasopharyngeal(NP) swabs in vial transport medium  Result Value Ref Range Status   SARS Coronavirus 2 by RT PCR NEGATIVE NEGATIVE Final    Comment: (NOTE) SARS-CoV-2 target nucleic acids are NOT DETECTED.  The SARS-CoV-2 RNA is generally detectable in upper respiratory specimens during the acute phase of infection. The lowest concentration of SARS-CoV-2 viral copies this assay can detect is 138 copies/mL. A negative result does not preclude SARS-Cov-2 infection and should not be used as the sole basis for treatment or other patient management decisions. A negative result may occur with  improper specimen  collection/handling, submission of specimen other than nasopharyngeal swab, presence of viral mutation(s) within the areas targeted by this assay, and inadequate number of viral  copies(<138 copies/mL). A negative result must be combined with clinical observations, patient history, and epidemiological information. The expected result is Negative.  Fact Sheet for Patients:  EntrepreneurPulse.com.au  Fact Sheet for Healthcare Providers:  IncredibleEmployment.be  This test is no t yet approved or cleared by the [place] FDA and  has been authorized for detection and/or diagnosis of SARS-CoV-2 by FDA under an Emergency Use Authorization (EUA). This EUA will remain  in effect (meaning this test can be used) for the duration of the COVID-19 declaration under Section 564(b)(1) of the Act, 21 U.S.C.section 360bbb-3(b)(1), unless the authorization is terminated  or revoked sooner.       Influenza A by PCR NEGATIVE NEGATIVE Final   Influenza B by PCR NEGATIVE NEGATIVE Final    Comment: (NOTE) The Xpert Xpress SARS-CoV-2/FLU/RSV plus assay is intended as an aid in the diagnosis of influenza from Nasopharyngeal swab specimens and should not be used as a sole basis for treatment. Nasal washings and aspirates are unacceptable for Xpert Xpress SARS-CoV-2/FLU/RSV testing.  Fact Sheet for Patients: EntrepreneurPulse.com.au  Fact Sheet for Healthcare Providers: IncredibleEmployment.be  This test is not yet approved or cleared by the [place] FDA and has been authorized for detection and/or diagnosis of SARS-CoV-2 by FDA under an Emergency Use Authorization (EUA). This EUA will remain in effect (meaning this test can be used) for the duration of the COVID-19 declaration under Section 564(b)(1) of the Act, 21 U.S.C. section 360bbb-3(b)(1), unless the authorization is terminated or revoked.  Performed at [facility], [place] [address], [place], [place] [address]   Culture, blood (Routine x 2)     Status: Abnormal (Preliminary result)   Collection Time: 08/15/21  5:02 PM   Specimen: BLOOD  Result Value Ref Range Status   Specimen Description BLOOD SITE NOT SPECIFIED  Final   Special Requests   Final    BOTTLES DRAWN AEROBIC AND ANAEROBIC Blood Culture results may not be optimal due to an excessive volume of blood received in culture bottles   Culture  Setup Time   Final    GRAM POSITIVE COCCI IN CHAINS IN BOTH AEROBIC AND ANAEROBIC BOTTLES CRITICAL RESULT CALLED TO, READ BACK BY AND VERIFIED WITH: Jiles Garter DOHLEN PHARMD @0817  08/16/21 EB    Culture (A)  Final    ENTEROCOCCUS FAECALIS SUSCEPTIBILITIES TO FOLLOW Performed at [facility], [place] [address], [place], [place] [address]    Report Status PENDING  Incomplete  Blood Culture ID Panel (Reflexed)     Status: Abnormal   Collection Time: 08/15/21  5:02 PM  Result Value Ref Range Status   Enterococcus faecalis DETECTED (A) NOT DETECTED Final    Comment: CRITICAL RESULT CALLED TO, READ BACK BY AND VERIFIED WITH: Jiles Garter DOHLEN PHARMD @0817  08/16/21 EB    Enterococcus Faecium NOT DETECTED NOT DETECTED Final   Listeria monocytogenes NOT DETECTED NOT DETECTED Final   Staphylococcus species NOT DETECTED NOT DETECTED Final   Staphylococcus aureus (BCID) NOT DETECTED NOT DETECTED Final   Staphylococcus epidermidis NOT DETECTED NOT DETECTED Final   Staphylococcus lugdunensis NOT DETECTED NOT DETECTED Final   Streptococcus species NOT DETECTED NOT DETECTED Final   Streptococcus agalactiae NOT DETECTED NOT DETECTED Final   Streptococcus pneumoniae NOT DETECTED NOT DETECTED Final   Streptococcus pyogenes NOT DETECTED NOT DETECTED Final   A.calcoaceticus-baumannii NOT DETECTED NOT DETECTED Final   Bacteroides fragilis NOT DETECTED NOT DETECTED Final   Enterobacterales NOT DETECTED NOT DETECTED Final  Enterobacter cloacae complex NOT  DETECTED NOT DETECTED Final   Escherichia coli NOT DETECTED NOT DETECTED Final   Klebsiella aerogenes NOT DETECTED NOT DETECTED Final   Klebsiella oxytoca NOT DETECTED NOT DETECTED Final   Klebsiella pneumoniae NOT DETECTED NOT DETECTED Final   Proteus species NOT DETECTED NOT DETECTED Final   Salmonella species NOT DETECTED NOT DETECTED Final   Serratia marcescens NOT DETECTED NOT DETECTED Final   Haemophilus influenzae NOT DETECTED NOT DETECTED Final   Neisseria meningitidis NOT DETECTED NOT DETECTED Final   Pseudomonas aeruginosa NOT DETECTED NOT DETECTED Final   Stenotrophomonas maltophilia NOT DETECTED NOT DETECTED Final   Candida albicans NOT DETECTED NOT DETECTED Final   Candida auris NOT DETECTED NOT DETECTED Final   Candida glabrata NOT DETECTED NOT DETECTED Final   Candida krusei NOT DETECTED NOT DETECTED Final   Candida parapsilosis NOT DETECTED NOT DETECTED Final   Candida tropicalis NOT DETECTED NOT DETECTED Final   Cryptococcus neoformans/gattii NOT DETECTED NOT DETECTED Final   Vancomycin resistance NOT DETECTED NOT DETECTED Final    Comment: Performed at [facility], [place] [address], [place], [place] [address]  Urine Culture     Status: None   Collection Time: 08/15/21  6:57 PM   Specimen: In/Out Cath Urine  Result Value Ref Range Status   Specimen Description IN/OUT CATH URINE  Final   Special Requests NONE  Final   Culture   Final    NO GROWTH Performed at [facility], [place] [address], [place], [place] [address]    Report Status 08/17/2021 FINAL  Final  MRSA Next Gen by PCR, Nasal     Status: None   Collection Time: 08/15/21 11:30 PM   Specimen: Nasal Mucosa; Nasal Swab  Result Value Ref Range Status   MRSA by PCR Next Gen NOT DETECTED NOT DETECTED Final    Comment: (NOTE) The GeneXpert MRSA Assay (FDA approved for NASAL specimens only), is one component of a comprehensive MRSA colonization surveillance program. It is not intended to diagnose MRSA  infection nor to guide or monitor treatment for MRSA infections. Test performance is not FDA approved in patients less than 16 years old. Performed at [facility], [place] [address], [place], [place] [address]   Culture, blood (Routine X 2) w Reflex to ID Panel     Status: None (Preliminary result)   Collection Time: 08/16/21  2:57 PM   Specimen: BLOOD  Result Value Ref Range Status   Specimen Description BLOOD SITE NOT SPECIFIED  Final   Special Requests   Final    AEROBIC BOTTLE ONLY Blood Culture results may not be optimal due to an inadequate volume of blood received in culture bottles   Culture   Final    NO GROWTH < 24 HOURS Performed at [facility], [place] [address] [place], [place] [address]    Report Status PENDING  Incomplete  Culture, blood (Routine X 2) w Reflex to ID Panel     Status: None (Preliminary result)   Collection Time: 08/16/21  2:58 PM   Specimen: BLOOD  Result Value Ref Range Status   Specimen Description BLOOD SITE NOT SPECIFIED  Final   Special Requests   Final    AEROBIC BOTTLE ONLY Blood Culture results may not be optimal due to an inadequate volume of blood received in culture bottles   Culture   Final    NO GROWTH < 24 HOURS Performed at [facility], [address] [place], [place] [address]    Report Status PENDING  Incomplete    Studies/Results: DG Chest 2 View  Result Date: 08/15/2021 CLINICAL DATA:  Fever, malaise for 2 days, cough EXAM: CHEST - 2 VIEW COMPARISON:  Chest radiograph 04/15/2021 FINDINGS: Image quality is degraded by patient positioning, with the head overlying portions of the upper lobes. The cardiomediastinal silhouette is grossly stable, with unchanged cardiomegaly. 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 There are bilateral pleural effusions, overall similar to prior studies. There are patchy opacities in the lung bases, improved on the right but worsened on the  left since 04/15/2021. There is no definite pneumothorax. There is no acute osseous abnormality. IMPRESSION: Overall unchanged bilateral pleural effusions since 04/15/2021, but with worsened aeration in the left base. Findings could reflect left lower lobe pneumonia superimposed on chronic changes in the correct clinical setting. Electronically Signed   By: Valetta Mole M.D.   On: 08/15/2021 17:42   CT HEAD WO CONTRAST (5MM)  Result Date: 08/16/2021 CLINICAL DATA:  Altered mental status, nontraumatic (Ped 0-17y). Sepsis. EXAM: CT HEAD WITHOUT CONTRAST TECHNIQUE: Contiguous axial images were obtained from the base of the skull through the vertex without intravenous contrast. COMPARISON:  None. FINDINGS: Brain: There is atrophy and chronic small vessel disease changes. No acute intracranial abnormality. Specifically, no hemorrhage, hydrocephalus, mass lesion, acute infarction, or significant intracranial injury. Vascular: No hyperdense vessel or unexpected calcification. Skull: No acute calvarial abnormality. Sinuses/Orbits: No acute findings Other: None IMPRESSION: Atrophy, chronic microvascular disease. No acute intracranial abnormality. Electronically Signed   By: Rolm Baptise M.D.   On: 08/16/2021 01:12   CT CHEST WO CONTRAST  Result Date: 08/16/2021 CLINICAL DATA:  Sepsis, respiratory distress EXAM: CT CHEST WITHOUT CONTRAST TECHNIQUE: Multidetector CT imaging of the chest was performed following the standard protocol without IV contrast. COMPARISON:  01/15/2021 FINDINGS: Cardiovascular: Postoperative changes from aortic root and arch repair. Stable descending thoracic aortic aneurysm, 5.1 cm. Heart is normal size. Coronary artery calcifications. Mediastinum/Nodes: Mildly prominent borderline sized mediastinal lymph nodes. Right paratracheal lymph node has a short axis diameter of 13 mm, stable. No axillary or visible hilar adenopathy. Lungs/Pleura: Small bilateral pleural effusions, similar to prior study.  Peripheral airspace opacities in both lower lobes as well as right middle lobe and lingula, favor rounded atelectasis. Overall no change since prior study. Moderate centrilobular and paraseptal emphysema. Upper Abdomen: No acute findings Musculoskeletal: Chest wall soft tissues are unremarkable. No acute bony abnormality. IMPRESSION: Stable small bilateral pleural effusions with peripheral somewhat rounded airspace opacities in both mid and lower lungs, favor rounded atelectasis and/or scarring. Postoperative changes from aortic repair related to aortic dissection. 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 Coronary artery disease. No definite acute process. Aortic Atherosclerosis (ICD10-I70.0) and Emphysema (ICD10-J43.9). Electronically Signed   By: Rolm Baptise M.D.   On: 08/16/2021 01:16   ECHOCARDIOGRAM COMPLETE  Result Date: 08/16/2021    ECHOCARDIOGRAM REPORT   Patient Name:   AUREL NGUYEN Date of Exam: 08/16/2021 Medical Rec #:  [id number]        Height:       73.0 in Accession #:    [id number]       Weight:       191.4 lb Date of Birth:  10/07/1950        BSA:          2.112 m Patient Age:    71 years  BP:           105/49 mmHg Patient Gender: M                HR:           64 bpm. Exam Location:  Inpatient Procedure: 2D Echo, Cardiac Doppler, Color Doppler and Intracardiac            Opacification Agent Indications:    Atrial fibrillation  History:        Patient has prior history of Echocardiogram examinations, most                 recent 09/06/2020. CHF, COPD, Aortic Valve Disease; Risk                 Factors:Hypertension and Dyslipidemia. 25 mm Bovine valve in                 aortic position. Procedure date 03/23/17. GERD. Hx ascending                 aortic dissection, s/p repair.  Sonographer:    Clayton Lefort RDCS (AE) Referring Phys: [id number] Estill Cotta  Sonographer Comments: Technically difficult study due to poor echo windows. Image acquisition challenging due to COPD.  IMPRESSIONS  1. Basal septal hypokinesis . Left ventricular ejection fraction, by estimation, is 55 to 60%. The left ventricle has normal function. The left ventricular internal cavity size was mildly dilated. There is mild left ventricular hypertrophy.  2. Right ventricular systolic function is low normal. The right ventricular size is moderately enlarged. There is moderately elevated pulmonary artery systolic pressure.  3. Right atrial size was severely dilated.  4. Mild mitral valve regurgitation.  5. Tricuspid valve regurgitation is moderate.  6. Poor acoustic windows limit study. 25 mm Edwards bovine valve present (03/23/2017) Difficult to see well. Peak and mean gradients through the valve are 5 and 2 mm Hg respectively Previous echo in May 2022, peak and mean gradients were 34 and 18 mm Hg respectively. Poor windows from current study, may not have attained optimal angle to measure gradients. Follow over time. . The aortic valve has been repaired/replaced. Aortic valve regurgitation is not visualized. Aortic valve sclerosis/calcification is present, without any evidence of aortic stenosis.  7. The inferior vena cava is dilated in size with <50% respiratory variability, suggesting right atrial pressure of 15 mmHg. FINDINGS  Left Ventricle: Basal septal hypokinesis. Left ventricular ejection fraction, by estimation, is 55 to 60%. The left ventricle has normal function. Definity contrast agent was given IV to delineate the left ventricular endocardial borders. The left ventricular internal cavity size was mildly dilated. There is mild left ventricular hypertrophy. Right Ventricle: The right ventricular size is moderately enlarged. Right vetricular wall thickness was not assessed. Right ventricular systolic function is low normal. There is moderately elevated pulmonary artery systolic pressure. The tricuspid regurgitant velocity is 3.18 m/s, and with an assumed right atrial pressure of 15 mmHg, the estimated right  ventricular systolic pressure is 22.0 mmHg. Left Atrium: Left atrial size was normal in size. Right Atrium: Right atrial size was severely dilated. Pericardium: There is no evidence of pericardial effusion. Mitral Valve: There is mild thickening of the mitral valve leaflet(s). Mild mitral annular calcification. Mild mitral valve regurgitation. Tricuspid Valve: The tricuspid valve is grossly normal. Tricuspid valve regurgitation is moderate. Aortic Valve: Poor acoustic windows limit study. 25 mm Edwards bovine valve present (03/23/2017) Difficult to see well. Peak and mean gradients through  the valve are 5 and 2 mm Hg respectively Previous echo in May 2022, peak and mean gradients were 34 and  18 mm Hg respectively. Poor windows from current study, may not have attained optimal angle to measure gradients. Follow over time. The aortic valve has been repaired/replaced. Aortic valve regurgitation is not visualized. Aortic valve sclerosis/calcification is present, without any evidence of aortic stenosis. Aortic valve mean gradient measures 2.0 mmHg. Aortic valve peak gradient measures 4.6 mmHg. Aortic valve area, by VTI measures 5.04 cm. Pulmonic Valve: The pulmonic valve was not well visualized. Aorta: The aortic root and ascending aorta are structurally normal, with no evidence of dilitation. Venous: The inferior vena cava is dilated in size with less than 50% respiratory variability, suggesting right atrial pressure of 15 mmHg. IAS/Shunts: No atrial level shunt detected by color flow Doppler.  LEFT VENTRICLE PLAX 2D LVIDd:         5.40 cm LVIDs:         4.10 cm LV PW:         1.20 cm LV IVS:        1.40 cm LVOT diam:     2.50 cm LV SV:         76 LV SV Index:   36 LVOT Area:     4.91 cm  IVC IVC diam: 2.60 cm LEFT ATRIUM           Index        RIGHT ATRIUM           Index LA diam:      3.10 cm 1.47 cm/m   RA Area:     32.50 cm LA Vol (A4C): 64.7 ml 30.64 ml/m  RA Volume:   115.00 ml 54.46 ml/m  AORTIC VALVE AV  Area (Vmax):    3.43 cm AV Area (Vmean):   3.28 cm AV Area (VTI):     5.04 cm AV Vmax:           107.00 cm/s AV Vmean:          71.900 cm/s AV VTI:            0.151 m AV Peak Grad:      4.6 mmHg AV Mean Grad:      2.0 mmHg LVOT Vmax:         74.80 cm/s LVOT Vmean:        48.100 cm/s LVOT VTI:          0.155 m LVOT/AV VTI ratio: 1.03  AORTA Ao Root diam: 3.10 cm Ao Asc diam:  3.60 cm TRICUSPID VALVE TR Peak grad:   40.4 mmHg TR Vmax:        318.00 cm/s  SHUNTS Systemic VTI:  0.16 m Systemic Diam: 2.50 cm Dorris Carnes MD Electronically signed by Dorris Carnes MD Signature Date/Time: 08/16/2021/12:56:09 PM    Final       Assessment/Plan:  INTERVAL HISTORY: 2D echocardiogram does not show endocarditis but views are suboptimal particularly of the prosthetic valve   Principal Problem:   Enterococcal bacteremia Active Problems:   Sepsis ([place])    Sean Riley is a 71 y.o. male with  hx of AVR and aortic dissection repair, COPD, atrial fibrillation admission with enterococcal bacteremia with septic shock.  #1  Enterococcal bacteremia with septic shock:  Concern remains for prosthetic valve and/or native valve endocarditis  I am ordering a CT of the abdomen pelvis with IV contrast now that his renal function is  normalized to ensure he does not have an intra-abdominal focus for infection.  Urine cultures are sterile.  Given that there does not appear to be a clear cut source of his bacteremia and his PV and murmur I am going to add high-dose ceftriaxone to his ampicillin and presumptively treat for prosthetic valve endocarditis.  It would be nice to have a transesophageal echocardiogram but his oxygen requirements will need to go down he is still on 8 L via nasal cannula.  He and his wife told me that he did undergo endoscopy for a GI bleed without problems  #2  Atrial fibrillation with rapid ventricular response on heparin the patient and his wife say that he is normally on flecainide.  They  would very much like for cardiology to see the patient which clearly they should and will.  I spent 36  minutes with the patient including than 50% of the time in face to face counseling of the patient regarding the nature of enterococcal bacteremia answering his and his family's questions regarding where this bacteria resides and how 1 can become bacteremic, the nature of potential prosthetic valve endocarditis, personally reviewing chest x-ray blood culture CBC urine culture CMP along with review of medical records in preparation for the visit and during the visit and in coordination of his care with Dr. Tamala Julian.   LOS: 2 days   Alcide Evener 08/17/2021, 2:09 PM

## 2021-08-17 NOTE — Progress Notes (Signed)
NAME:  Sean Riley, MRN:  [id number], DOB:  1951-06-08, LOS: 2 ADMISSION DATE:  08/15/2021, CONSULTATION DATE:  08/15/2020 REFERRING MD:  Jeanell Sparrow , CHIEF COMPLAINT:  Confusion   History of Present Illness:  Patient is encephalopathic. Therefore history has been obtained from chart review and wife Sean Riley.  Sean Riley, is a 71 y.o. male, who presented to the [facility] ED with a chief complaint of confusion, cough, and SOB  They have a pertinent past medical history of COPD on home O2, afib on eliquis, CHF, ascending aortic dissection s/p repair, AVR on 03/24/2017, HTN, GIB, GERD  Per Sean Riley (Wife), the patient was feeling unwell over the past 2-3 days with generalized fatigue, fever, slight cough. The morning of 1/6 the patient was confused and difficult to arouse. EMS was called.   In the ED he was found to be hypoxic with sats of 83% on 4L on arrival. He was started on BiPAP. Initial ABG 7.45/37/75/26. He was found to be febrile with a tmax 102.1. BP 86/64 in ED. A code sepsis was called. UA, BC ordered. Vanc, cefepime, flagyl started. Possible LLL pneumonia on CXR. Red LLE. +1 edema to BLLE. WBC 11.3.  PCCM was consulted for admission.      Pertinent  Medical History  COPD on home O2, afib on eliquis, CHF, ascending aortic dissection s/p repair, AVR on 03/24/2017, HTN, GIB, GERD  Significant Hospital Events: Including procedures, antibiotic start and stop dates in addition to other pertinent events   1/6 presented to [facility] ED, Vanc, cefepime, flagy, BC>, UC>, ECHO  Interim History / Subjective:  No events. Off bipap. Confused. Afib/RVR  Objective   Blood pressure (!) 121/54, pulse (!) 117, temperature (!) 97.5 F (36.4 C), resp. rate (!) 23, height 6\' 1"  (1.854 m), weight 85.4 kg, SpO2 93 %.        Intake/Output Summary (Last 24 hours) at 08/17/2021 1453 Last data filed at 08/17/2021 1000 Gross per 24 hour  Intake 1208.6 ml  Output 1630 ml  Net -421.4 ml    Filed Weights   08/15/21  1636 08/16/21 0500 08/17/21 0500  Weight: 86.2 kg 86.8 kg 85.4 kg    Examination: No distress MM dry Heart sounds irregular, tachycardic, +SEM Hard of hearing and confused Moves all 4 ext to command   Sodium improved AKI improved WBC up slightly E faecalis in blood, echo performed  Resolved Hospital Problem list     Assessment & Plan:  Septic shock- due to E faecalis bacteremia, source unclear at present but suspicion for IE AKI due to above- improved with IVF Septic encephalopathy- improved Acute on chronic hypoxemic resp failure- ongoing, will get CXR; 3-4LPM at home, currently on 8LPM Chronic pleural effusions- NTD here COPD on HOT Afib on eliquis- on heparin here Previous AVR and TAA repair 2018- question infection Hx HTN, GIB, GERD Hyponatremia- acute on chronic Sepsis vs. ICU delirium- ongoing, better when family is here  TEE if respiratory status improves CT A/P, Abx per ID IS, flutter Resume home meds as ordered including flecainide Will ask Dr. Aundra Dubin to see patient at family request Heparin gtt for now, transition back to eliquis prior to DC Can go to progressive, appreciate [facility] taking over starting 08/18/20  Best Practice (right click and "Reselect all SmartList Selections" daily)   Diet/type: cardiac diet DVT prophylaxis: heparin GI prophylaxis: PPI Lines: N/A Foley:  Yes, and it is still needed Code Status:  full code Last date of multidisciplinary goals of  care discussion [Full scope. Discussed with wife at bedside 1/7]  Erskine Emery MD PCCM

## 2021-08-17 NOTE — Progress Notes (Signed)
[place] for heparin Indication: atrial fibrillation  Allergies  Allergen Reactions   Simvastatin Other (See Comments)    Mental status changes   Celecoxib Other (See Comments)    GI UPSET AND INFLAMMATION   Codeine Other (See Comments)    GI UPSET AND INFLAMMATION   Nsaids Other (See Comments)    GI UPSET AND INFLAMMATION (can tolerate via IV)   Tape Other (See Comments)    Medical tape and Band-Aids PULL OFF THE SKIN; please use Coban wrap   Cephalexin Itching and Rash   Latex Rash    Patient Measurements: Height: 6\' 1"  (185.4 cm) Weight: 85.4 kg (188 lb 4.4 oz) IBW/kg (Calculated) : 79.9 Heparin Dosing Weight: TBW   Vital Signs: Temp: 97.5 F (36.4 C) (01/08 1330) Temp Source: Bladder (01/08 0800) BP: 121/54 (01/08 1300) Pulse Rate: 117 (01/08 1330)  Labs: Recent Labs    08/15/21 1650 08/15/21 1743 08/16/21 0149 08/16/21 0440 08/16/21 0601 08/16/21 1221 08/17/21 0246 08/17/21 1031 08/17/21 1325  HGB 12.1*   < > 10.7* 11.6* 11.0*  --  11.0*  --   --   HCT 37.1*   < > 34.2* 34.0* 36.7*  --  34.0*  --   --   PLT 189  --  137*  --  147*  --  180  --   --   APTT  --    < > 40*  --   --  67* 75*  --  74*  LABPROT 19.0*  --   --   --   --   --   --   --   --   INR 1.6*  --   --   --   --   --   --   --   --   HEPARINUNFRC  --   --   --   --   --  >1.10* 0.69  --  0.62  CREATININE 1.94*  --  1.61*  --   --   --   --  1.19  --    < > = values in this interval not displayed.     Estimated Creatinine Clearance: 64.3 mL/min (by C-G formula based on SCr of 1.19 mg/dL).   Medical History: 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 LUTS    Hypertension     Microscopic hematuria    negative cystoscopy   NONSPECIFIC ABNORMAL ELECTROCARDIOGRAM    Pleural effusion on right    Pleural effusion, bilateral    S/P aortic dissection repair 03/24/2017   Biological Bentall aortic root replacement + resection and grafting of entire ascending aorta, transverse aortic arch and proximal descending thoracic aorta with elephant trunk distal anastomosis and debranching of aortic arch vessels   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 54 g 3 08/14/2021   ALPRAZolam (XANAX) 1 MG tablet TAKE 1 TABLET BY MOUTH AT BEDTIME. (Patient taking differently: Take 1 mg by mouth at bedtime.) 30 tablet 3 08/14/2021   apixaban (ELIQUIS) 5 MG TABS tablet Take 5 mg by mouth 2 (two) times daily.   08/14/2021   augmented betamethasone dipropionate (DIPROLENE-AF) 0.05 % cream APPLY TOPICALLY 2 TIMES DAILY AS NEEDED. (Patient taking differently: Apply 1 application topically 2 (two) times daily as needed (itching).) 50 g 1 Past Week   bisacodyl (DULCOLAX) 5 MG EC tablet Take 5 mg by mouth daily as needed for moderate constipation.   unk   Budeson-Glycopyrrol-Formoterol (BREZTRI AEROSPHERE) 160-9-4.8 MCG/ACT AERO Inhale 2 puffs into the lungs in the morning and at bedtime. 32.1 g 3 08/14/2021   diphenhydrAMINE (BENADRYL) 25 MG tablet Take 25 mg by mouth daily as needed (allergies/runny nose).   08/11/2021   escitalopram (LEXAPRO) 10 MG tablet TAKE 1 TABLET DAILY (Patient taking differently: Take 10 mg by mouth daily.) 90 tablet 3 08/14/2021    esomeprazole (NEXIUM) 40 MG capsule Take 1 capsule (40 mg total) by mouth daily before breakfast. 90 capsule 3 08/14/2021   flecainide (TAMBOCOR) 50 MG tablet TAKE ONE AND ONE-HALF TABLETS TWICE A DAY (Patient taking differently: Take 75 mg by mouth 2 (two) times daily.) 270 tablet 2 08/14/2021   fluticasone (FLONASE) 50 MCG/ACT nasal spray Place 1 spray into both nostrils 2 (two) times daily as needed for allergies. Use one spray in each nostril twice daily.   Past Month   hydroxypropyl methylcellulose / hypromellose (ISOPTO TEARS / GONIOVISC) 2.5 % ophthalmic solution Place 1-2 drops into both eyes 3 (three) times daily as needed (dry/irritated eyes).   unk   ipratropium-albuterol (DUONEB) 0.5-2.5 (3) MG/3ML SOLN Take 3 mLs by nebulization every 4 (four) hours as needed. (Patient taking differently: Take 3 mLs by nebulization every 4 (four) hours as needed (shortness of breath).) 360 mL 11 Past Month   JARDIANCE 10 MG TABS tablet TAKE 1 TABLET DAILY BEFORE BREAKFAST (Patient taking differently: Take 10 mg by mouth daily.) 90 tablet 3 08/14/2021   magnesium hydroxide (MILK OF MAGNESIA) 400 MG/5ML suspension Take 7.5 mLs by mouth daily as needed (stomach cramps/constipation).   unk   metoprolol tartrate (LOPRESSOR) 25 MG tablet Take 0.5 tablets (12.5 mg total) by mouth 2 (two) times daily. Please make yearly appt with Dr. Caryl Comes for January 2022 for future refills. Thank you 1st attempt 180 tablet 1 08/14/2021 at 2300   OXYGEN Inhale 3-4 L/min into the lungs continuous.      potassium chloride SA (KLOR-CON M) 20 MEQ tablet Take 40 mEq by mouth at bedtime.   08/14/2021   rosuvastatin (CRESTOR) 5 MG tablet TAKE 1 TABLET DAILY (Patient taking differently: Take 5 mg by mouth at bedtime.) 90 tablet 3 08/14/2021   torsemide (DEMADEX) 20 MG tablet Take 4 tablets (80 mg total) by mouth daily. (Patient taking differently: Take 60 mg by mouth daily.) 360 tablet 3 08/14/2021   apixaban (ELIQUIS) 5 MG TABS tablet Take 1 tablet (5  mg total) by mouth 2 (two) times daily for 1 day. (Patient not taking: Reported on 08/15/2021) 180 tablet 2 Completed Course   hydrocortisone 2.5 % cream Apply topically 2 (two) times daily. (Patient not taking: Reported on 08/15/2021) 30 g 0 Not Taking   potassium chloride  SA (KLOR-CON M20) 20 MEQ tablet Take 2 tablets (40 mEq total) by mouth every morning AND 1 tablet (20 mEq total) every evening. (Patient not taking: Reported on 08/15/2021) 270 tablet 3 Not Taking   triamcinolone cream (KENALOG) 0.5 % Apply 1 application topically 3 (three) times daily. (Patient not taking: Reported on 08/15/2021)   Not Taking    Assessment: 66 YOM with h/o AFib on Eliquis at home to start IV heparin. Of note, patient reports not taking his Eliquis at home; however, baseline heparin level is elevated >1.1 as expected.  aPTT resulted therapeutic at 74 (AM lab resulted late after repeat drawn but is consistent). Heparin level decreased but still not correlating at 0.62. Hg stable at 11, plt up to 180. SCr improving. No bleeding or issues with infusion reported.  Goal of Therapy:  Heparin level 0.3-0.7 units/ml aPTT 66-102 seconds Monitor platelets by anticoagulation protocol: Yes   Plan:  Continue heparin infusion at 1350 units/hr Monitor daily heparin level and aPTT until correlating, CBC, s/sx bleeding F/u resume PTA apixaban as appropriate   Arturo Morton, PharmD, BCPS Please check AMION for all [place] contact numbers Clinical Pharmacist 08/17/2021 2:45 PM

## 2021-08-18 DIAGNOSIS — R7881 Bacteremia: Secondary | ICD-10-CM | POA: Diagnosis not present

## 2021-08-18 DIAGNOSIS — N179 Acute kidney failure, unspecified: Secondary | ICD-10-CM

## 2021-08-18 DIAGNOSIS — T826XXA Infection and inflammatory reaction due to cardiac valve prosthesis, initial encounter: Secondary | ICD-10-CM

## 2021-08-18 DIAGNOSIS — I480 Paroxysmal atrial fibrillation: Secondary | ICD-10-CM

## 2021-08-18 DIAGNOSIS — J9621 Acute and chronic respiratory failure with hypoxia: Secondary | ICD-10-CM | POA: Diagnosis not present

## 2021-08-18 DIAGNOSIS — I5032 Chronic diastolic (congestive) heart failure: Secondary | ICD-10-CM

## 2021-08-18 DIAGNOSIS — J431 Panlobular emphysema: Secondary | ICD-10-CM

## 2021-08-18 DIAGNOSIS — G9341 Metabolic encephalopathy: Secondary | ICD-10-CM | POA: Diagnosis not present

## 2021-08-18 DIAGNOSIS — B952 Enterococcus as the cause of diseases classified elsewhere: Secondary | ICD-10-CM | POA: Diagnosis not present

## 2021-08-18 DIAGNOSIS — I38 Endocarditis, valve unspecified: Secondary | ICD-10-CM

## 2021-08-18 DIAGNOSIS — A419 Sepsis, unspecified organism: Secondary | ICD-10-CM | POA: Diagnosis not present

## 2021-08-18 DIAGNOSIS — L8990 Pressure ulcer of unspecified site, unspecified stage: Secondary | ICD-10-CM | POA: Insufficient documentation

## 2021-08-18 LAB — GLUCOSE, CAPILLARY
Glucose-Capillary: 107 mg/dL — ABNORMAL HIGH (ref 70–99)
Glucose-Capillary: 102 mg/dL — ABNORMAL HIGH (ref 70–99)
Glucose-Capillary: 103 mg/dL — ABNORMAL HIGH (ref 70–99)
Glucose-Capillary: 111 mg/dL — ABNORMAL HIGH (ref 70–99)
Glucose-Capillary: 153 mg/dL — ABNORMAL HIGH (ref 70–99)
Glucose-Capillary: 83 mg/dL (ref 70–99)
Glucose-Capillary: 108 mg/dL — ABNORMAL HIGH (ref 70–99)

## 2021-08-18 LAB — CBC
HCT: 33.8 % — ABNORMAL LOW (ref 39.0–52.0)
Hemoglobin: 10.9 g/dL — ABNORMAL LOW (ref 13.0–17.0)
MCH: 26.9 pg (ref 26.0–34.0)
MCHC: 32.2 g/dL (ref 30.0–36.0)
MCV: 83.5 fL (ref 80.0–100.0)
Platelets: 180 10*3/uL (ref 150–400)
RBC: 4.05 MIL/uL — ABNORMAL LOW (ref 4.22–5.81)
RDW: 15.9 % — ABNORMAL HIGH (ref 11.5–15.5)
WBC: 8.2 10*3/uL (ref 4.0–10.5)
nRBC: 0 % (ref 0.0–0.2)

## 2021-08-18 LAB — CULTURE, BLOOD (ROUTINE X 2)

## 2021-08-18 LAB — BASIC METABOLIC PANEL
Anion gap: 4 — ABNORMAL LOW (ref 5–15)
BUN: 16 mg/dL (ref 8–23)
CO2: 25 mmol/L (ref 22–32)
Calcium: 7.7 mg/dL — ABNORMAL LOW (ref 8.9–10.3)
Chloride: 107 mmol/L (ref 98–111)
Creatinine, Ser: 1.05 mg/dL (ref 0.61–1.24)
GFR, Estimated: >60 mL/min (ref >60)
Glucose, Bld: 105 mg/dL — ABNORMAL HIGH (ref 70–99)
Potassium: 3.9 mmol/L (ref 3.5–5.1)
Sodium: 136 mmol/L (ref 135–145)

## 2021-08-18 LAB — APTT: aPTT: 73 seconds — ABNORMAL HIGH (ref 24–36)

## 2021-08-18 LAB — HEPARIN LEVEL (UNFRACTIONATED)
Heparin Unfractionated: 0.48 IU/mL (ref 0.30–0.70)
Heparin Unfractionated: 0.41 IU/mL (ref 0.30–0.70)

## 2021-08-18 MED ORDER — POTASSIUM CHLORIDE CRYS ER 20 MEQ PO TBCR
40.0000 meq | EXTENDED_RELEASE_TABLET | Freq: Once | ORAL | Status: AC
  Administered 2021-08-18: 40 meq via ORAL
  Filled 2021-08-18: qty 2

## 2021-08-18 MED ORDER — FUROSEMIDE 10 MG/ML IJ SOLN
40.0000 mg | Freq: Once | INTRAMUSCULAR | Status: AC
  Administered 2021-08-18: 40 mg via INTRAVENOUS
  Filled 2021-08-18: qty 4

## 2021-08-18 MED ORDER — FUROSEMIDE 10 MG/ML IJ SOLN: 40.0000 mg | Freq: Once | INTRAMUSCULAR | Status: DC

## 2021-08-18 NOTE — Assessment & Plan Note (Addendum)
Presented with creatinine of 1.94.  Baseline creatinine between 1.3-1.5.   Renal function has improved stable.  Monitor urine output.  Avoid nephrotoxic agents.

## 2021-08-18 NOTE — Progress Notes (Signed)
Subjective:  No new complaints   Antibiotics:  Anti-infectives (From admission, onward)    Start     Dose/Rate Route Frequency Ordered Stop   08/17/21 1530  cefTRIAXone (ROCEPHIN) 2 g in sodium chloride 0.9 % 100 mL IVPB        2 g 200 mL/hr over 30 Minutes Intravenous Every 12 hours 08/17/21 1435     08/16/21 2000  vancomycin (VANCOREADY) IVPB 1250 mg/250 mL  Status:  Discontinued        1,250 mg 166.7 mL/hr over 90 Minutes Intravenous Every 24 hours 08/15/21 1957 08/16/21 0844   08/16/21 0930  ampicillin (OMNIPEN) 2 g in sodium chloride 0.9 % 100 mL IVPB        2 g 300 mL/hr over 20 Minutes Intravenous Every 4 hours 08/16/21 0844     08/16/21 0600  ceFEPIme (MAXIPIME) 2 g in sodium chloride 0.9 % 100 mL IVPB  Status:  Discontinued        2 g 200 mL/hr over 30 Minutes Intravenous Every 12 hours 08/15/21 1957 08/16/21 0844   08/15/21 1715  vancomycin (VANCOREADY) IVPB 1750 mg/350 mL        1,750 mg 175 mL/hr over 120 Minutes Intravenous  Once 08/15/21 1711 08/15/21 2102   08/15/21 1700  ceFEPIme (MAXIPIME) 2 g in sodium chloride 0.9 % 100 mL IVPB        2 g 200 mL/hr over 30 Minutes Intravenous  Once 08/15/21 1655 08/15/21 1803   08/15/21 1700  metroNIDAZOLE (FLAGYL) IVPB 500 mg        500 mg 100 mL/hr over 60 Minutes Intravenous  Once 08/15/21 1655 08/15/21 1833   08/15/21 1700  vancomycin (VANCOCIN) IVPB 1000 mg/200 mL premix  Status:  Discontinued        1,000 mg 200 mL/hr over 60 Minutes Intravenous  Once 08/15/21 1655 08/15/21 1711       Medications: Scheduled Meds:  ALPRAZolam  1 mg Oral QHS   Chlorhexidine Gluconate Cloth  6 each Topical Daily   escitalopram  10 mg Oral Daily   flecainide  75 mg Oral BID   insulin aspart  0-15 Units Subcutaneous Q4H   mouth rinse  15 mL Mouth Rinse BID   metoprolol tartrate  12.5 mg Oral BID   pantoprazole  40 mg Oral QHS   potassium chloride  40 mEq Oral Once   rosuvastatin  5 mg Oral Daily   triamcinolone cream    Topical BID   umeclidinium-vilanterol  1 puff Inhalation Daily   Continuous Infusions:  sodium chloride Stopped (08/16/21 0004)   ampicillin (OMNIPEN) IV 300 mL/hr at 08/18/21 0900   cefTRIAXone (ROCEPHIN)  IV Stopped (08/18/21 0622)   heparin 1,350 Units/hr (08/18/21 0900)   PRN Meds:.albuterol, bisacodyl, docusate sodium, fluticasone, magnesium hydroxide, ondansetron (ZOFRAN) IV, polyethylene glycol, polyvinyl alcohol    Objective: Weight change: 2.9 kg  Intake/Output Summary (Last 24 hours) at 08/18/2021 1229 Last data filed at 08/18/2021 0900 Gross per 24 hour  Intake 1043.28 ml  Output 1352 ml  Net -308.72 ml    Blood pressure 124/78, pulse 92, temperature (!) 96.3 F (35.7 C), temperature source Axillary, resp. rate 20, height 6\' 1"  (1.854 m), weight 88.3 kg, SpO2 97 %. Temp:  [96.3 F (35.7 C)-97.5 F (36.4 C)] 96.3 F (35.7 C) (01/09 0300) Pulse Rate:  [49-128] 92 (01/09 0932) Resp:  [16-26] 20 (01/09 0932) BP: (102-142)/(50-101) 124/78 (01/09 0900) SpO2:  [83 %-98 %]  97 % (01/09 0932) Weight:  [88.3 kg] 88.3 kg (01/09 0351)  Physical Exam: Physical Exam Constitutional:      Appearance: He is well-developed.  HENT:     Head: Normocephalic and atraumatic.  Eyes:     Conjunctiva/sclera: Conjunctivae normal.  Cardiovascular:     Rate and Rhythm: Tachycardia present. Rhythm irregular.     Heart sounds: Murmur heard.  Pulmonary:     Effort: Tachypnea and accessory muscle usage present. No respiratory distress.     Breath sounds: Normal breath sounds. No stridor. No wheezing.  Abdominal:     General: There is no distension.     Palpations: Abdomen is soft.  Musculoskeletal:        General: Normal range of motion.     Cervical back: Normal range of motion and neck supple.  Skin:    General: Skin is warm and dry.     Findings: No erythema or rash.  Neurological:     General: No focal deficit present.     Mental Status: He is alert and oriented to person,  place, and time.  Psychiatric:        Mood and Affect: Mood normal.        Behavior: Behavior normal.        Thought Content: Thought content normal.        Judgment: Judgment normal.     CBC:    BMET Recent Labs    08/17/21 1031 08/18/21 0029  NA 134* 136  K 3.4* 3.9  CL 100 107  CO2 25 25  GLUCOSE 132* 105*  BUN 24* 16  CREATININE 1.19 1.05  CALCIUM 8.2* 7.7*      Liver Panel  Recent Labs    08/15/21 1650  PROT 7.2  ALBUMIN 3.2*  AST 68*  ALT 36  ALKPHOS 119  BILITOT 1.4*        Sedimentation Rate Recent Labs    08/17/21 0246  ESRSEDRATE 15    C-Reactive Protein Recent Labs    08/17/21 0246  CRP 9.0*     Micro Results: Recent Results (from the past 720 hour(s))  Culture, blood (Routine x 2)     Status: Abnormal   Collection Time: 08/15/21  4:50 PM   Specimen: BLOOD  Result Value Ref Range Status   Specimen Description BLOOD SITE NOT SPECIFIED  Final   Special Requests   Final    BOTTLES DRAWN AEROBIC AND ANAEROBIC Blood Culture results may not be optimal due to an excessive volume of blood received in culture bottles   Culture  Setup Time   Final    GRAM POSITIVE COCCI IN BOTH AEROBIC AND ANAEROBIC BOTTLES CRITICAL VALUE NOTED.  VALUE IS CONSISTENT WITH PREVIOUSLY REPORTED AND CALLED VALUE.    Culture (A)  Final    ENTEROCOCCUS FAECALIS SUSCEPTIBILITIES PERFORMED ON PREVIOUS CULTURE WITHIN THE LAST 5 DAYS. Performed at [facility], [place] [address], [place], [place] [address]    Report Status 08/18/2021 FINAL  Final  Resp Panel by RT-PCR (Flu A&B, Covid) Peripheral     Status: None   Collection Time: 08/15/21  4:51 PM   Specimen: Peripheral; Nasopharyngeal(NP) swabs in vial transport medium  Result Value Ref Range Status   SARS Coronavirus 2 by RT PCR NEGATIVE NEGATIVE Final    Comment: (NOTE) SARS-CoV-2 target nucleic acids are NOT DETECTED.  The SARS-CoV-2 RNA is generally detectable in upper respiratory specimens  during the acute phase of infection. The lowest concentration  of SARS-CoV-2 viral copies this assay can detect is 138 copies/mL. A negative result does not preclude SARS-Cov-2 infection and should not be used as the sole basis for treatment or other patient management decisions. A negative result may occur with  improper specimen collection/handling, submission of specimen other than nasopharyngeal swab, presence of viral mutation(s) within the areas targeted by this assay, and inadequate number of viral copies(<138 copies/mL). A negative result must be combined with clinical observations, patient history, and epidemiological information. The expected result is Negative.  Fact Sheet for Patients:  EntrepreneurPulse.com.au  Fact Sheet for Healthcare Providers:  IncredibleEmployment.be  This test is no t yet approved or cleared by the [place] FDA and  has been authorized for detection and/or diagnosis of SARS-CoV-2 by FDA under an Emergency Use Authorization (EUA). This EUA will remain  in effect (meaning this test can be used) for the duration of the COVID-19 declaration under Section 564(b)(1) of the Act, 21 U.S.C.section 360bbb-3(b)(1), unless the authorization is terminated  or revoked sooner.       Influenza A by PCR NEGATIVE NEGATIVE Final   Influenza B by PCR NEGATIVE NEGATIVE Final    Comment: (NOTE) The Xpert Xpress SARS-CoV-2/FLU/RSV plus assay is intended as an aid in the diagnosis of influenza from Nasopharyngeal swab specimens and should not be used as a sole basis for treatment. Nasal washings and aspirates are unacceptable for Xpert Xpress SARS-CoV-2/FLU/RSV testing.  Fact Sheet for Patients: EntrepreneurPulse.com.au  Fact Sheet for Healthcare Providers: IncredibleEmployment.be  This test is not yet approved or cleared by the [place] FDA and has been authorized for detection  and/or diagnosis of SARS-CoV-2 by FDA under an Emergency Use Authorization (EUA). This EUA will remain in effect (meaning this test can be used) for the duration of the COVID-19 declaration under Section 564(b)(1) of the Act, 21 U.S.C. section 360bbb-3(b)(1), unless the authorization is terminated or revoked.  Performed at [facility], [place] [address] [place], [place] [address]   Culture, blood (Routine x 2)     Status: Abnormal   Collection Time: 08/15/21  5:02 PM   Specimen: BLOOD  Result Value Ref Range Status   Specimen Description BLOOD SITE NOT SPECIFIED  Final   Special Requests   Final    BOTTLES DRAWN AEROBIC AND ANAEROBIC Blood Culture results may not be optimal due to an excessive volume of blood received in culture bottles   Culture  Setup Time   Final    GRAM POSITIVE COCCI IN CHAINS IN BOTH AEROBIC AND ANAEROBIC BOTTLES CRITICAL RESULT CALLED TO, READ BACK BY AND VERIFIED WITHJiles Garter [facility] PHARMD @0817  08/16/21 EB Performed at [facility], [place] [address] [place], [place] [address]    Culture ENTEROCOCCUS FAECALIS (A)  Final   Report Status 08/18/2021 FINAL  Final   Organism ID, Bacteria ENTEROCOCCUS FAECALIS  Final      Susceptibility   Enterococcus faecalis - MIC*    AMPICILLIN <=2 SENSITIVE Sensitive     VANCOMYCIN 1 SENSITIVE Sensitive     GENTAMICIN SYNERGY SENSITIVE Sensitive     * ENTEROCOCCUS FAECALIS  Blood Culture ID Panel (Reflexed)     Status: Abnormal   Collection Time: 08/15/21  5:02 PM  Result Value Ref Range Status   Enterococcus faecalis DETECTED (A) NOT DETECTED Final    Comment: CRITICAL RESULT CALLED TO, READ BACK BY AND VERIFIED WITH: H, VON DOHLEN PHARMD @0817  08/16/21 EB    Enterococcus Faecium NOT DETECTED NOT  DETECTED Final   Listeria monocytogenes NOT DETECTED NOT DETECTED Final   Staphylococcus species NOT DETECTED NOT DETECTED Final   Staphylococcus aureus (BCID) NOT DETECTED NOT DETECTED Final   Staphylococcus  epidermidis NOT DETECTED NOT DETECTED Final   Staphylococcus lugdunensis NOT DETECTED NOT DETECTED Final   Streptococcus species NOT DETECTED NOT DETECTED Final   Streptococcus agalactiae NOT DETECTED NOT DETECTED Final   Streptococcus pneumoniae NOT DETECTED NOT DETECTED Final   Streptococcus pyogenes NOT DETECTED NOT DETECTED Final   A.calcoaceticus-baumannii NOT DETECTED NOT DETECTED Final   Bacteroides fragilis NOT DETECTED NOT DETECTED Final   Enterobacterales NOT DETECTED NOT DETECTED Final   Enterobacter cloacae complex NOT DETECTED NOT DETECTED Final   Escherichia coli NOT DETECTED NOT DETECTED Final   Klebsiella aerogenes NOT DETECTED NOT DETECTED Final   Klebsiella oxytoca NOT DETECTED NOT DETECTED Final   Klebsiella pneumoniae NOT DETECTED NOT DETECTED Final   Proteus species NOT DETECTED NOT DETECTED Final   Salmonella species NOT DETECTED NOT DETECTED Final   Serratia marcescens NOT DETECTED NOT DETECTED Final   Haemophilus influenzae NOT DETECTED NOT DETECTED Final   Neisseria meningitidis NOT DETECTED NOT DETECTED Final   Pseudomonas aeruginosa NOT DETECTED NOT DETECTED Final   Stenotrophomonas maltophilia NOT DETECTED NOT DETECTED Final   Candida albicans NOT DETECTED NOT DETECTED Final   Candida auris NOT DETECTED NOT DETECTED Final   Candida glabrata NOT DETECTED NOT DETECTED Final   Candida krusei NOT DETECTED NOT DETECTED Final   Candida parapsilosis NOT DETECTED NOT DETECTED Final   Candida tropicalis NOT DETECTED NOT DETECTED Final   Cryptococcus neoformans/gattii NOT DETECTED NOT DETECTED Final   Vancomycin resistance NOT DETECTED NOT DETECTED Final    Comment: Performed at [facility], [address], [place], [place] [address]  Urine Culture     Status: None   Collection Time: 08/15/21  6:57 PM   Specimen: In/Out Cath Urine  Result Value Ref Range Status   Specimen Description IN/OUT CATH URINE  Final   Special Requests NONE  Final   Culture   Final     NO GROWTH Performed at [facility], [place] [address], [place], [place] [address]    Report Status 08/17/2021 FINAL  Final  MRSA Next Gen by PCR, Nasal     Status: None   Collection Time: 08/15/21 11:30 PM   Specimen: Nasal Mucosa; Nasal Swab  Result Value Ref Range Status   MRSA by PCR Next Gen NOT DETECTED NOT DETECTED Final    Comment: (NOTE) The GeneXpert MRSA Assay (FDA approved for NASAL specimens only), is one component of a comprehensive MRSA colonization surveillance program. It is not intended to diagnose MRSA infection nor to guide or monitor treatment for MRSA infections. Test performance is not FDA approved in patients less than 80 years old. Performed at [facility], [place] [address] [place], [place] [address]   Culture, blood (Routine X 2) w Reflex to ID Panel     Status: None (Preliminary result)   Collection Time: 08/16/21  2:57 PM   Specimen: BLOOD  Result Value Ref Range Status   Specimen Description BLOOD SITE NOT SPECIFIED  Final   Special Requests   Final    AEROBIC BOTTLE ONLY Blood Culture results may not be optimal due to an inadequate volume of blood received in culture bottles   Culture   Final    NO GROWTH 2 DAYS Performed at [facility], [place]  [address], [place], [place] [address]    Report Status PENDING  Incomplete  Culture, blood (Routine X 2) w Reflex to ID Panel     Status: None (Preliminary result)   Collection Time: 08/16/21  2:58 PM   Specimen: BLOOD  Result Value Ref Range Status   Specimen Description BLOOD SITE NOT SPECIFIED  Final   Special Requests   Final    AEROBIC BOTTLE ONLY Blood Culture results may not be optimal due to an inadequate volume of blood received in culture bottles   Culture   Final    NO GROWTH 2 DAYS Performed at [facility], [place] [address] [place], [place] [address]    Report Status PENDING  Incomplete    Studies/Results: DG Chest 1 View  Result Date: 08/17/2021 CLINICAL DATA:   Edema EXAM: CHEST  1 VIEW COMPARISON:  Chest x-ray 08/15/2021, CT 08/16/2021, radiograph 04/15/2021 FINDINGS: Post sternotomy changes with aortic stent graft. Emphysema and chronic interstitial disease. Left-sided pleural effusion, similar right pleural effusion, effusions appears slightly loculated. Peripheral parenchymal opacities are also unchanged and could be due to chronic scarring. Cardiomegaly with vascular congestion. IMPRESSION: 1. Chronic bilateral effusions with slight increased pleural effusion on the left compared to prior. 2. Cardiomegaly with slight central congestion 3. Emphysema with chronic interstitial disease. Electronically Signed   By: Donavan Foil M.D.   On: 08/17/2021 16:56   CT ABDOMEN PELVIS W CONTRAST  Result Date: 08/17/2021 CLINICAL DATA:  Sepsis EXAM: CT ABDOMEN AND PELVIS WITH CONTRAST TECHNIQUE: Multidetector CT imaging of the abdomen and pelvis was performed using the standard protocol following bolus administration of intravenous contrast. CONTRAST:  179mL OMNIPAQUE IOHEXOL 300 MG/ML  SOLN COMPARISON:  01/29/2021 FINDINGS: Lower chest: Cardiomegaly. Unchanged small, loculated bilateral pleural effusions associated rounded atelectasis. Hepatobiliary: No solid liver abnormality is seen. Gallstones and sludge contracted in the gallbladder. Gallbladder wall thickening, or biliary dilatation. Pancreas: Unremarkable. No pancreatic ductal dilatation or surrounding inflammatory changes. Spleen: Normal in size without significant abnormality. Adrenals/Urinary Tract: Adrenal glands are unremarkable. Kidneys are normal, without renal calculi, solid lesion, or hydronephrosis. Foley catheter in the urinary bladder. Thickening of the urinary bladder wall, likely related to chronic outlet obstruction. Stomach/Bowel: Stomach is within normal limits. Appendix is not clearly visualized. No evidence of bowel wall thickening, distention, or inflammatory changes. Descending and sigmoid  diverticulosis. Vascular/Lymphatic: Aortic atherosclerosis. 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 The abdominal aorta measures up to 4.7 x 4.0 cm in maximum caliber near the diaphragmatic hiatus. No enlarged abdominal or pelvic lymph nodes. Reproductive: Prostatomegaly. Other: Anasarca.  No abdominopelvic ascites. Musculoskeletal: No acute or significant osseous findings. IMPRESSION: 1. No acute CT findings of the abdomen or pelvis to explain sepsis. 2. Gallstones and sludge contracted in the gallbladder. No CT evidence of acute cholecystitis. 3. Descending and sigmoid diverticulosis without evidence of acute diverticulitis. 4. 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 The abdominal aorta measures up to 4.7 x 4.0 cm in maximum caliber near the diaphragmatic hiatus. Aortic Atherosclerosis (ICD10-I70.0). Electronically Signed   By: Delanna Ahmadi M.D.   On: 08/17/2021 16:25      Assessment/Plan:  INTERVAL HISTORY:   CT scan not show intra-abdominal infection or source of his enterococcal bacteremia  Principal Problem:   Enterococcal bacteremia Active Problems:   Atrial  fibrillation (HCC)   COPD (chronic obstructive pulmonary disease) (HCC)   Normocytic anemia   Acute on chronic respiratory failure with hypoxia (HCC)   Chronic diastolic CHF (congestive heart failure) (HCC)   Severe sepsis with septic shock (HCC)   AKI (acute kidney injury) (HCC)   Pressure injury of skin   Acute metabolic encephalopathy    EARNESTINE TUOHEY is a 71 y.o. male with  hx of AVR and aortic dissection repair, COPD, atrial fibrillation admission with enterococcal bacteremia with septic shock.  #1  Enterococcal bacteremia septic shock:  Remains for prosthetic valve  endocarditis  I have added ceftriaxone to high-dose to his ampicillin  Cardiology has seen the patient.  Currently with 8 L of nasal cannula requirements he will not build to have a TEE   If we are not able to get a TEE I would treat him empirically with 6 weeks of dual beta-lactam therapy  #2  Atrial fibrillation with rapid ventricular response on heparin the patient and his wife say that he is normally on flecainide. Fibrillation with rapid ventricular response  Cardiology are seeing the patient today   I spent 36 minutes with the patient including than 50% of the time in face to face counseling of the patient his wife regarding the nature of prosthetic valve endocarditis regarding the nature of Enterococcus faecalis, personally reviewing T abdomen pelvis updated cultures CBC BMP along with review of medical records in preparation for the visit and during the visit and in coordination of his care.    LOS: 3 days   Alcide Evener 08/18/2021, 12:29 PM

## 2021-08-18 NOTE — Assessment & Plan Note (Addendum)
Patient uses oxygen at home at 3 to 4 L/min.  Has been on high amounts of oxygen here in the hospital.  Currently on 8 L/min HFNC. Chest x-ray on 1/8 raise concern for emphysema and also concern for volume overload.  Found to have chronic pleural effusions Started on furosemide yesterday.  Diuresed well.  Cardiology is continuing twice a day furosemide at this time. Continue nebulizer treatment as needed.  He is noted to be on Anoro Ellipta.  No wheezing is appreciated on examination.

## 2021-08-18 NOTE — Progress Notes (Signed)
[place] for heparin Indication: atrial fibrillation  Allergies  Allergen Reactions   Simvastatin Other (See Comments)    Mental status changes   Celecoxib Other (See Comments)    GI UPSET AND INFLAMMATION   Codeine Other (See Comments)    GI UPSET AND INFLAMMATION   Nsaids Other (See Comments)    GI UPSET AND INFLAMMATION (can tolerate via IV)   Tape Other (See Comments)    Medical tape and Band-Aids PULL OFF THE SKIN; please use Coban wrap   Cephalexin Itching and Rash   Latex Rash    Patient Measurements: Height: 6\' 1"  (185.4 cm) Weight: 88.3 kg (194 lb 10.7 oz) IBW/kg (Calculated) : 79.9 Heparin Dosing Weight: TBW   Vital Signs: Temp: 96.3 F (35.7 C) (01/09 0300) Temp Source: Axillary (01/09 0300) BP: 124/78 (01/09 0900) Pulse Rate: 92 (01/09 0932)  Labs: Recent Labs    08/15/21 1650 08/15/21 1743 08/16/21 0149 08/16/21 0440 08/16/21 0601 08/16/21 1221 08/17/21 0246 08/17/21 1031 08/17/21 1325 08/18/21 0029  HGB 12.1*   < > 10.7*   < > 11.0*  --  11.0*  --   --  10.9*  HCT 37.1*   < > 34.2*   < > 36.7*  --  34.0*  --   --  33.8*  PLT 189  --  137*  --  147*  --  180  --   --  180  APTT  --   --  40*  --   --    < > 75*  --  74* 73*  LABPROT 19.0*  --   --   --   --   --   --   --   --   --   INR 1.6*  --   --   --   --   --   --   --   --   --   HEPARINUNFRC  --   --   --   --   --    < > 0.69  --  0.62 0.48  CREATININE 1.94*  --  1.61*  --   --   --   --  1.19  --  1.05   < > = values in this interval not displayed.     Estimated Creatinine Clearance: 72.9 mL/min (by C-G formula based on SCr of 1.05 mg/dL).   Medical History: 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   Atrial fibrillation (HCC)    Cataract    removed both eyes    CHF (congestive heart failure) (HCC)    GERD (gastroesophageal reflux disease)    HYPERLIPIDEMIA    HYPERPLASIA  PROSTATE UNS W/O UR OBST & OTH LUTS    Hypertension    Microscopic hematuria    negative cystoscopy   NONSPECIFIC ABNORMAL ELECTROCARDIOGRAM    Pleural effusion on right    Pleural effusion, bilateral    S/P aortic dissection repair 03/24/2017   Biological Bentall aortic root replacement + resection and grafting of entire ascending aorta, transverse aortic arch and proximal descending thoracic aorta with elephant trunk distal anastomosis and debranching of aortic arch vessels   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 54 g 3 08/14/2021   ALPRAZolam (XANAX) 1 MG tablet TAKE 1 TABLET BY MOUTH AT BEDTIME. (Patient taking differently: Take 1 mg by mouth at bedtime.) 30 tablet 3 08/14/2021   bisacodyl (DULCOLAX) 5 MG EC tablet Take 5 mg by mouth daily as needed for moderate constipation.   unk   Budeson-Glycopyrrol-Formoterol (BREZTRI AEROSPHERE) 160-9-4.8 MCG/ACT AERO Inhale 2 puffs into the lungs in the morning and at bedtime. 32.1 g 3 08/14/2021   escitalopram (LEXAPRO) 10 MG tablet TAKE 1 TABLET DAILY (Patient taking differently: Take 10 mg by mouth daily.) 90 tablet 3 08/14/2021   esomeprazole (NEXIUM) 40 MG capsule Take 1 capsule (40 mg total) by mouth daily before breakfast. 90 capsule 3 08/14/2021   flecainide (TAMBOCOR) 50 MG tablet TAKE ONE AND ONE-HALF TABLETS TWICE A DAY (Patient taking differently: Take 75 mg by mouth 2 (two) times daily.) 270 tablet 2 08/14/2021   fluticasone (FLONASE) 50 MCG/ACT nasal spray Place 1 spray into both nostrils 2 (two)  times daily as needed for allergies. Use one spray in each nostril twice daily.   Past Month   hydroxypropyl methylcellulose / hypromellose (ISOPTO TEARS / GONIOVISC) 2.5 % ophthalmic solution Place 1-2 drops into both eyes 3 (three) times daily as needed (dry/irritated eyes).   unk   ipratropium-albuterol (DUONEB) 0.5-2.5 (3) MG/3ML SOLN Take 3 mLs by nebulization every 4 (four) hours as needed. (Patient taking differently: Take 3 mLs by nebulization every 4 (four) hours as needed (shortness of breath).) 360 mL 11 Past Month   JARDIANCE 10 MG TABS tablet TAKE 1 TABLET DAILY BEFORE BREAKFAST (Patient taking differently: Take 10 mg by mouth daily.) 90 tablet 3 08/14/2021   magnesium hydroxide (MILK OF MAGNESIA) 400 MG/5ML suspension Take 7.5 mLs by mouth daily as needed (stomach cramps/constipation).   unk   metoprolol tartrate (LOPRESSOR) 25 MG tablet Take 0.5 tablets (12.5 mg total) by mouth 2 (two) times daily. Please make yearly appt with Dr. Caryl Comes for January 2022 for future refills. Thank you 1st attempt 180 tablet 1 08/14/2021 at 2300   OXYGEN Inhale 3-4 L/min into the lungs continuous.      potassium chloride SA (KLOR-CON M) 20 MEQ tablet Take 40 mEq by mouth at bedtime.   08/14/2021   rosuvastatin (CRESTOR) 5 MG tablet TAKE 1 TABLET DAILY (Patient taking differently: Take 5 mg by mouth at bedtime.) 90 tablet 3 08/14/2021   torsemide (DEMADEX) 20 MG tablet Take 4 tablets (80 mg total) by mouth daily. (Patient taking differently: Take 60 mg by mouth daily.) 360 tablet 3 08/14/2021   potassium chloride SA (KLOR-CON M20) 20 MEQ tablet Take 2 tablets (40 mEq total) by mouth every morning AND 1 tablet (20 mEq total) every evening. (Patient not taking: Reported on 08/15/2021) 270 tablet 3 Not Taking   triamcinolone cream (KENALOG) 0.5 % Apply 1 application topically 3 (three) times daily. (Patient not taking: Reported on 08/15/2021)   Not Taking    Assessment: 37 YOM with h/o AFib on Eliquis at home to start IV  heparin. Of note, patient reports not taking his Eliquis at home; however, baseline heparin level is elevated >1.1 as expected.  aPTT resulted therapeutic at 73. Heparin level: 0.48 SCr improving (  1.19>1.05) No signs/symptoms of bleeding.  Goal of Therapy:  Heparin level 0.3-0.7 units/ml aPTT 66-102 seconds Monitor platelets by anticoagulation protocol: Yes   Plan:  Increase heparin infusion at 1400 units/hr Will get heparin level today in 8 hours. Monitor daily heparin level, CBC, s/sx bleeding. F/u resume PTA apixaban as appropriate   Thank you for allowing pharmacy to be apart of this patient's care  Asher Muir, PharmD Candidate  Please check AMION for all [place] numbers After 10:00 PM, call main pharmacy [phone] 08/18/2021 10:21 AM

## 2021-08-18 NOTE — Assessment & Plan Note (Addendum)
Acute on chronic diastolic CHF  Continue furosemide.  Cardiology is managing this.  Monitor electrolytes.

## 2021-08-18 NOTE — Assessment & Plan Note (Signed)
Hemoglobin stable.  No evidence of overt bleeding.

## 2021-08-18 NOTE — Hospital Course (Addendum)
Sean Riley, is a 71 y.o. male, who presented to the [facility] ED with a chief complaint of confusion, cough, and SOB.  He has a past medical history of COPD on home O2, afib on eliquis, CHF, ascending aortic dissection s/p repair, AVR on 03/24/2017, HTN, GIB, GERD.  Patient was noted to be hypoxic.  Started on BiPAP.  Also noted to be hypotensive.  Was hospitalized to the intensive care unit.  Developed Enterococcus bacteremia.  Seen by infectious disease.  Stabilized.  Transferred to the hospitalist service.  Cardiology was consulted as well. Noted to have volume overload and started on IV furosemide.  Remains on high flow nasal cannula at 8 L/min.

## 2021-08-18 NOTE — Assessment & Plan Note (Addendum)
Mentation is stable.  No focal neurological deficits noted.  Reorient daily.  Mobilize.

## 2021-08-18 NOTE — Progress Notes (Signed)
[place] for heparin Indication: atrial fibrillation  Allergies  Allergen Reactions   Simvastatin Other (See Comments)    Mental status changes   Celecoxib Other (See Comments)    GI UPSET AND INFLAMMATION   Codeine Other (See Comments)    GI UPSET AND INFLAMMATION   Nsaids Other (See Comments)    GI UPSET AND INFLAMMATION (can tolerate via IV)   Tape Other (See Comments)    Medical tape and Band-Aids PULL OFF THE SKIN; please use Coban wrap   Cephalexin Itching and Rash   Latex Rash    Patient Measurements: Height: 6\' 1"  (185.4 cm) Weight: 88.3 kg (194 lb 10.7 oz) IBW/kg (Calculated) : 79.9 Heparin Dosing Weight: TBW   Vital Signs: Temp: 97.5 F (36.4 C) (01/09 1928) Temp Source: Oral (01/09 1928) BP: 132/69 (01/09 2000) Pulse Rate: 88 (01/09 2000)  Labs: Recent Labs    08/16/21 0149 08/16/21 0440 08/16/21 0601 08/16/21 1221 08/17/21 0246 08/17/21 1031 08/17/21 1325 08/18/21 0029 08/18/21 2018  HGB 10.7*   < > 11.0*  --  11.0*  --   --  10.9*  --   HCT 34.2*   < > 36.7*  --  34.0*  --   --  33.8*  --   PLT 137*  --  147*  --  180  --   --  180  --   APTT 40*  --   --    < > 75*  --  74* 73*  --   HEPARINUNFRC  --   --   --    < > 0.69  --  0.62 0.48 0.41  CREATININE 1.61*  --   --   --   --  1.19  --  1.05  --    < > = values in this interval not displayed.     Estimated Creatinine Clearance: 72.9 mL/min (by C-G formula based on SCr of 1.05 mg/dL).   Medical History: 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 LUTS    Hypertension    Microscopic hematuria    negative cystoscopy   NONSPECIFIC ABNORMAL ELECTROCARDIOGRAM    Pleural effusion on  right    Pleural effusion, bilateral    S/P aortic dissection repair 03/24/2017   Biological Bentall aortic root replacement + resection and grafting of entire ascending aorta, transverse aortic arch and proximal descending thoracic aorta with elephant trunk distal anastomosis and debranching of aortic arch vessels   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 54 g 3 08/14/2021   ALPRAZolam (XANAX) 1 MG tablet TAKE 1 TABLET BY MOUTH AT BEDTIME. (Patient taking differently: Take 1 mg by mouth at bedtime.) 30  tablet 3 08/14/2021   bisacodyl (DULCOLAX) 5 MG EC tablet Take 5 mg by mouth daily as needed for moderate constipation.   unk   Budeson-Glycopyrrol-Formoterol (BREZTRI AEROSPHERE) 160-9-4.8 MCG/ACT AERO Inhale 2 puffs into the lungs in the morning and at bedtime. 32.1 g 3 08/14/2021   escitalopram (LEXAPRO) 10 MG tablet TAKE 1 TABLET DAILY (Patient taking differently: Take 10 mg by mouth daily.) 90 tablet 3 08/14/2021   esomeprazole (NEXIUM) 40 MG capsule Take 1 capsule (40 mg total) by mouth daily before breakfast. 90 capsule 3 08/14/2021   flecainide (TAMBOCOR) 50 MG tablet TAKE ONE AND ONE-HALF TABLETS TWICE A DAY (Patient taking differently: Take 75 mg by mouth 2 (two) times daily.) 270 tablet 2 08/14/2021   fluticasone (FLONASE) 50 MCG/ACT nasal spray Place 1 spray into both nostrils 2 (two) times daily as needed for allergies. Use one spray in each nostril twice daily.   Past Month   hydroxypropyl methylcellulose / hypromellose (ISOPTO TEARS / GONIOVISC)  2.5 % ophthalmic solution Place 1-2 drops into both eyes 3 (three) times daily as needed (dry/irritated eyes).   unk   ipratropium-albuterol (DUONEB) 0.5-2.5 (3) MG/3ML SOLN Take 3 mLs by nebulization every 4 (four) hours as needed. (Patient taking differently: Take 3 mLs by nebulization every 4 (four) hours as needed (shortness of breath).) 360 mL 11 Past Month   JARDIANCE 10 MG TABS tablet TAKE 1 TABLET DAILY BEFORE BREAKFAST (Patient taking differently: Take 10 mg by mouth daily.) 90 tablet 3 08/14/2021   magnesium hydroxide (MILK OF MAGNESIA) 400 MG/5ML suspension Take 7.5 mLs by mouth daily as needed (stomach cramps/constipation).   unk   metoprolol tartrate (LOPRESSOR) 25 MG tablet Take 0.5 tablets (12.5 mg total) by mouth 2 (two) times daily. Please make yearly appt with Dr. Caryl Comes for January 2022 for future refills. Thank you 1st attempt 180 tablet 1 08/14/2021 at 2300   OXYGEN Inhale 3-4 L/min into the lungs continuous.      potassium chloride SA (KLOR-CON M) 20 MEQ tablet Take 40 mEq by mouth at bedtime.   08/14/2021   rosuvastatin (CRESTOR) 5 MG tablet TAKE 1 TABLET DAILY (Patient taking differently: Take 5 mg by mouth at bedtime.) 90 tablet 3 08/14/2021   torsemide (DEMADEX) 20 MG tablet Take 4 tablets (80 mg total) by mouth daily. (Patient taking differently: Take 60 mg by mouth daily.) 360 tablet 3 08/14/2021   potassium chloride SA (KLOR-CON M20) 20 MEQ tablet Take 2 tablets (40 mEq total) by mouth every morning AND 1 tablet (20 mEq total) every evening. (Patient not taking: Reported on 08/15/2021) 270 tablet 3 Not Taking   triamcinolone cream (KENALOG) 0.5 % Apply 1 application topically 3 (three) times daily. (Patient not taking: Reported on 08/15/2021)   Not Taking    Assessment: 59 YOM with h/o AFib on Eliquis at home to start IV heparin. Of note, patient reports not taking his Eliquis at home; however, baseline heparin level is elevated >1.1 as expected.  HL remains therapeutic on 1400  units/hr. RN reports no s/s of overt bleeding   Goal of Therapy:  Heparin level 0.3-0.7 units/ml Monitor platelets by anticoagulation protocol: Yes   Plan:  Continue heparin infusion at 1400 units/hr Monitor daily heparin level, CBC, s/sx bleeding. F/u resume PTA apixaban as appropriate   Thank you for allowing pharmacy to be apart of this patient's care  Albertina Parr, PharmD., BCPS, BCCCP Clinical Pharmacist Please refer to [facility] for unit-specific pharmacist

## 2021-08-18 NOTE — Assessment & Plan Note (Signed)
Stable.  See above.  Nebulizer treatment.  Continue maintenance medications.

## 2021-08-18 NOTE — Assessment & Plan Note (Addendum)
Known history of same.  Was on Eliquis prior to admission.  Currently on heparin infusion.  Noted to be on metoprolol and flecainide.  Previous history of aortic valve replacement as well.  Also has a history of thoracic aortic aneurysm repair in 2018. Cardiology is following.

## 2021-08-18 NOTE — Progress Notes (Signed)
[facility] PROGRESS NOTE   Sean Riley FIE:[id number] DOB: April 23, 1951 DOA: 08/15/2021  3 DOS: the patient was seen and examined on 08/18/2021  PCP: Colon Branch, MD  Brief History and Hospital Course:  Sean Riley, is a 71 y.o. male, who presented to the [facility] ED with a chief complaint of confusion, cough, and SOB.  He has a past medical history of COPD on home O2, afib on eliquis, CHF, ascending aortic dissection s/p repair, AVR on 03/24/2017, HTN, GIB, GERD.  Patient was noted to be hypoxic.  Started on BiPAP.  Also noted to be hypotensive.  Was hospitalized to the intensive care unit.  Developed Enterococcus bacteremia.  Seen by infectious disease.  Stabilized.  Transferred to the hospitalist service.  Cardiology was consulted as well.  Consultants: Cardiology.  Critical care medicine.  Procedures: None    Subjective: Patient noted to be mildly confused.  Denies any abdominal pain.  No chest pain or shortness of breath.    Assessment/Plan:  Severe sepsis with septic shock ([place]) Secondary to Enterococcus faecalis bacteremia.  Source is unclear.  Concern for infective endocarditis.  Infectious diseases following.  Patient is noted to be on ampicillin and ceftriaxone. Due to tenuous respiratory status TEE has not been done yet. Noted to have normal WBC.  Is noted to be afebrile.  Underwent CT scan of the abdomen pelvis which did not show any obvious source.  He was found to have cholelithiasis without cholecystitis.  Diverticulosis was noted without diverticulitis.   Continue to monitor.  Acute on chronic respiratory failure with hypoxia ([place])- (present on admission) Patient uses oxygen at home at 3 to 4 L/min.  Currently on 6 to 8 L/min.  Chest x-ray done yesterday evening showed emphysema.  Some concern for fluid overload.  He has chronic pleural effusions.  We will give him a dose of furosemide.  Given nebulizer treatments.  He is noted to be on Anoro Ellipta.  No  wheezing is appreciated on examination.  Continue to monitor.  AKI (acute kidney injury) ([place])- (present on admission) Presented with creatinine of 1.94.  Baseline creatinine between 1.3-1.5.  Renal function has improved.  Monitor urine output.  Avoid nephrotoxic agents.  Acute metabolic encephalopathy- (present on admission) Remains mildly encephalopathic.  No focal neurological deficits noted.  Likely due to acute illness.  Continue to monitor.  Reorient daily.  COPD (chronic obstructive pulmonary disease) ([place])- (present on admission) Stable.  See above.  Nebulizer treatment.  Continue maintenance medications.  Atrial fibrillation ([place])- (present on admission) Known history of same.  Was on Eliquis prior to admission.  Currently on heparin infusion.  Noted to be on metoprolol and flecainide.  Cardiology has been consulted.  Previous history of aortic valve replacement as well.  Also has a history of thoracic aortic aneurysm repair in 2018.  Chronic diastolic CHF (congestive heart failure) ([place])- (present on admission) Concern for mild volume overload this morning.  Lasix to be given.  Normocytic anemia Hemoglobin stable.  No evidence of overt bleeding.      Pressure Injury 08/16/21 Buttocks Right;Left Stage 1 -  Intact skin with non-blanchable redness of a localized area usually over a bony prominence. Deep tissue injury with erythema and maroon coloration ([place])  08/16/21 0000  Location: Buttocks  Location Orientation: Right;Left  Staging: Stage 1 -  Intact skin with non-blanchable redness of a localized area usually over a bony prominence.  Wound Description (Comments): Deep tissue injury with erythema and maroon  coloration  Present on Admission: Yes       DVT Prophylaxis: On IV heparin Code Status: Full code Family Communication: No family at bedside this morning Disposition Plan: Hopefully return home when improved.  Will need PT and OT evaluation as well.  Status  is: Inpatient  Remains inpatient appropriate because: Acute respiratory failure, bacteremia     Medications: Scheduled:  ALPRAZolam  1 mg Oral QHS   Chlorhexidine Gluconate Cloth  6 each Topical Daily   escitalopram  10 mg Oral Daily   flecainide  75 mg Oral BID   insulin aspart  0-15 Units Subcutaneous Q4H   mouth rinse  15 mL Mouth Rinse BID   metoprolol tartrate  12.5 mg Oral BID   pantoprazole  40 mg Oral QHS   potassium chloride  40 mEq Oral Once   rosuvastatin  5 mg Oral Daily   triamcinolone cream   Topical BID   umeclidinium-vilanterol  1 puff Inhalation Daily   Continuous:  sodium chloride Stopped (08/16/21 0004)   ampicillin (OMNIPEN) IV 300 mL/hr at 08/18/21 0900   cefTRIAXone (ROCEPHIN)  IV Stopped (08/18/21 0622)   heparin 1,350 Units/hr (08/18/21 0900)   QQP:YPPJKDTOI, bisacodyl, docusate sodium, fluticasone, magnesium hydroxide, ondansetron (ZOFRAN) IV, polyethylene glycol, polyvinyl alcohol  Antibiotics: Anti-infectives (From admission, onward)    Start     Dose/Rate Route Frequency Ordered Stop   08/17/21 1530  cefTRIAXone (ROCEPHIN) 2 g in sodium chloride 0.9 % 100 mL IVPB        2 g 200 mL/hr over 30 Minutes Intravenous Every 12 hours 08/17/21 1435     08/16/21 2000  vancomycin (VANCOREADY) IVPB 1250 mg/250 mL  Status:  Discontinued        1,250 mg 166.7 mL/hr over 90 Minutes Intravenous Every 24 hours 08/15/21 1957 08/16/21 0844   08/16/21 0930  ampicillin (OMNIPEN) 2 g in sodium chloride 0.9 % 100 mL IVPB        2 g 300 mL/hr over 20 Minutes Intravenous Every 4 hours 08/16/21 0844     08/16/21 0600  ceFEPIme (MAXIPIME) 2 g in sodium chloride 0.9 % 100 mL IVPB  Status:  Discontinued        2 g 200 mL/hr over 30 Minutes Intravenous Every 12 hours 08/15/21 1957 08/16/21 0844   08/15/21 1715  vancomycin (VANCOREADY) IVPB 1750 mg/350 mL        1,750 mg 175 mL/hr over 120 Minutes Intravenous  Once 08/15/21 1711 08/15/21 2102   08/15/21 1700  ceFEPIme  (MAXIPIME) 2 g in sodium chloride 0.9 % 100 mL IVPB        2 g 200 mL/hr over 30 Minutes Intravenous  Once 08/15/21 1655 08/15/21 1803   08/15/21 1700  metroNIDAZOLE (FLAGYL) IVPB 500 mg        500 mg 100 mL/hr over 60 Minutes Intravenous  Once 08/15/21 1655 08/15/21 1833   08/15/21 1700  vancomycin (VANCOCIN) IVPB 1000 mg/200 mL premix  Status:  Discontinued        1,000 mg 200 mL/hr over 60 Minutes Intravenous  Once 08/15/21 1655 08/15/21 1711       Objective:  Vital Signs  Vitals:   08/18/21 0804 08/18/21 0900 08/18/21 0930 08/18/21 0932  BP:  124/78    Pulse:  86 100 92  Resp:  18  20  Temp:      TempSrc:      SpO2: 94% 93%  97%  Weight:      Height:  Intake/Output Summary (Last 24 hours) at 08/18/2021 1117 Last data filed at 08/18/2021 0900 Gross per 24 hour  Intake 1063.68 ml  Output 1458 ml  Net -394.32 ml   Filed Weights   08/16/21 0500 08/17/21 0500 08/18/21 0351  Weight: 86.8 kg 85.4 kg 88.3 kg    General appearance: Awake alert.  In no distress.  Distracted Resp: Noted to be tachypneic.  No use of accessory muscles.  Diminished air entry at the bases with few crackles.  No wheezing. Cardio: S1-S2 is irregularly irregular.  Tachycardic.   GI: Abdomen is soft.  Nontender nondistended.  Bowel sounds are present normal.  No masses organomegaly Extremities: Mild edema.  Moving all of his extremities Neurologic:  No focal neurological deficits.    Lab Results:  Data Reviewed: I have personally reviewed labs and imaging study reports  CBC: Recent Labs  Lab 08/15/21 1650 08/15/21 1743 08/16/21 0149 08/16/21 0440 08/16/21 0601 08/17/21 0246 08/18/21 0029  WBC 11.3*  --  10.4  --  13.8* 9.1 8.2  NEUTROABS 10.1*  --   --   --   --   --   --   HGB 12.1*   < > 10.7* 11.6* 11.0* 11.0* 10.9*  HCT 37.1*   < > 34.2* 34.0* 36.7* 34.0* 33.8*  MCV 82.1  --  84.4  --  86.6 82.1 83.5  PLT 189  --  137*  --  147* 180 180   < > = values in this interval not  displayed.    Basic Metabolic Panel: Recent Labs  Lab 08/15/21 1650 08/15/21 1743 08/16/21 0149 08/16/21 0440 08/17/21 1031 08/18/21 0029  NA 129* 131* 133* 133* 134* 136  K 3.4* 3.4* 3.7 4.3 3.4* 3.9  CL 95*  --  98  --  100 107  CO2 24  --  24  --  25 25  GLUCOSE 155*  --  128*  --  132* 105*  BUN 31*  --  27*  --  24* 16  CREATININE 1.94*  --  1.61*  --  1.19 1.05  CALCIUM 8.8*  --  8.4*  --  8.2* 7.7*  MG  --   --  2.0  --  2.1  --   PHOS  --   --  3.9  --   --   --     GFR: Estimated Creatinine Clearance: 72.9 mL/min (by C-G formula based on SCr of 1.05 mg/dL).  Liver Function Tests: Recent Labs  Lab 08/15/21 1650  AST 68*  ALT 36  ALKPHOS 119  BILITOT 1.4*  PROT 7.2  ALBUMIN 3.2*    Recent Labs  Lab 08/16/21 0149  AMMONIA 31    Coagulation Profile: Recent Labs  Lab 08/15/21 1650  INR 1.6*      HbA1C: Recent Labs    08/16/21 0149  HGBA1C 5.5    CBG: Recent Labs  Lab 08/17/21 1549 08/17/21 1945 08/17/21 2313 08/18/21 0341 08/18/21 0737  GLUCAP 142* 97 114* 107* 102*      Recent Results (from the past 240 hour(s))  Culture, blood (Routine x 2)     Status: Abnormal   Collection Time: 08/15/21  4:50 PM   Specimen: BLOOD  Result Value Ref Range Status   Specimen Description BLOOD SITE NOT SPECIFIED  Final   Special Requests   Final    BOTTLES DRAWN AEROBIC AND ANAEROBIC Blood Culture results may not be optimal due to an excessive volume of blood received  in culture bottles   Culture  Setup Time   Final    GRAM POSITIVE COCCI IN BOTH AEROBIC AND ANAEROBIC BOTTLES CRITICAL VALUE NOTED.  VALUE IS CONSISTENT WITH PREVIOUSLY REPORTED AND CALLED VALUE.    Culture (A)  Final    ENTEROCOCCUS FAECALIS SUSCEPTIBILITIES PERFORMED ON PREVIOUS CULTURE WITHIN THE LAST 5 DAYS. Performed at [facility], [place] [address] [place], [name] [address]    Report Status 08/18/2021 FINAL  Final  Resp Panel by RT-PCR (Flu A&B, Covid)  Peripheral     Status: None   Collection Time: 08/15/21  4:51 PM   Specimen: Peripheral; Nasopharyngeal(NP) swabs in vial transport medium  Result Value Ref Range Status   SARS Coronavirus 2 by RT PCR NEGATIVE NEGATIVE Final    Comment: (NOTE) SARS-CoV-2 target nucleic acids are NOT DETECTED.  The SARS-CoV-2 RNA is generally detectable in upper respiratory specimens during the acute phase of infection. The lowest concentration of SARS-CoV-2 viral copies this assay can detect is 138 copies/mL. A negative result does not preclude SARS-Cov-2 infection and should not be used as the sole basis for treatment or other patient management decisions. A negative result may occur with  improper specimen collection/handling, submission of specimen other than nasopharyngeal swab, presence of viral mutation(s) within the areas targeted by this assay, and inadequate number of viral copies(<138 copies/mL). A negative result must be combined with clinical observations, patient history, and epidemiological information. The expected result is Negative.  Fact Sheet for Patients:  EntrepreneurPulse.com.au  Fact Sheet for Healthcare Providers:  IncredibleEmployment.be  This test is no t yet approved or cleared by the [place] FDA and  has been authorized for detection and/or diagnosis of SARS-CoV-2 by FDA under an Emergency Use Authorization (EUA). This EUA will remain  in effect (meaning this test can be used) for the duration of the COVID-19 declaration under Section 564(b)(1) of the Act, 21 U.S.C.section 360bbb-3(b)(1), unless the authorization is terminated  or revoked sooner.       Influenza A by PCR NEGATIVE NEGATIVE Final   Influenza B by PCR NEGATIVE NEGATIVE Final    Comment: (NOTE) The Xpert Xpress SARS-CoV-2/FLU/RSV plus assay is intended as an aid in the diagnosis of influenza from Nasopharyngeal swab specimens and should not be used as a sole  basis for treatment. Nasal washings and aspirates are unacceptable for Xpert Xpress SARS-CoV-2/FLU/RSV testing.  Fact Sheet for Patients: EntrepreneurPulse.com.au  Fact Sheet for Healthcare Providers: IncredibleEmployment.be  This test is not yet approved or cleared by the [place] FDA and has been authorized for detection and/or diagnosis of SARS-CoV-2 by FDA under an Emergency Use Authorization (EUA). This EUA will remain in effect (meaning this test can be used) for the duration of the COVID-19 declaration under Section 564(b)(1) of the Act, 21 U.S.C. section 360bbb-3(b)(1), unless the authorization is terminated or revoked.  Performed at [facility], [place] [address] [place], [place] [address]   Culture, blood (Routine x 2)     Status: Abnormal   Collection Time: 08/15/21  5:02 PM   Specimen: BLOOD  Result Value Ref Range Status   Specimen Description BLOOD SITE NOT SPECIFIED  Final   Special Requests   Final    BOTTLES DRAWN AEROBIC AND ANAEROBIC Blood Culture results may not be optimal due to an excessive volume of blood received in culture bottles   Culture  Setup Time   Final    GRAM POSITIVE COCCI IN CHAINS IN BOTH AEROBIC AND  ANAEROBIC BOTTLES CRITICAL RESULT CALLED TO, READ BACK BY AND VERIFIED WITHJiles Garter [facility] PHARMD @0817  08/16/21 EB Performed at [place] [address], [place], [place] [address]    Culture ENTEROCOCCUS FAECALIS (A)  Final   Report Status 08/18/2021 FINAL  Final   Organism ID, Bacteria ENTEROCOCCUS FAECALIS  Final      Susceptibility   Enterococcus faecalis - MIC*    AMPICILLIN <=2 SENSITIVE Sensitive     VANCOMYCIN 1 SENSITIVE Sensitive     GENTAMICIN SYNERGY SENSITIVE Sensitive     * ENTEROCOCCUS FAECALIS  Blood Culture ID Panel (Reflexed)     Status: Abnormal   Collection Time: 08/15/21  5:02 PM  Result Value Ref Range Status   Enterococcus faecalis DETECTED (A) NOT DETECTED  Final    Comment: CRITICAL RESULT CALLED TO, READ BACK BY AND VERIFIED WITH: Jiles Garter DOHLEN PHARMD @0817  08/16/21 EB    Enterococcus Faecium NOT DETECTED NOT DETECTED Final   Listeria monocytogenes NOT DETECTED NOT DETECTED Final   Staphylococcus species NOT DETECTED NOT DETECTED Final   Staphylococcus aureus (BCID) NOT DETECTED NOT DETECTED Final   Staphylococcus epidermidis NOT DETECTED NOT DETECTED Final   Staphylococcus lugdunensis NOT DETECTED NOT DETECTED Final   Streptococcus species NOT DETECTED NOT DETECTED Final   Streptococcus agalactiae NOT DETECTED NOT DETECTED Final   Streptococcus pneumoniae NOT DETECTED NOT DETECTED Final   Streptococcus pyogenes NOT DETECTED NOT DETECTED Final   A.calcoaceticus-baumannii NOT DETECTED NOT DETECTED Final   Bacteroides fragilis NOT DETECTED NOT DETECTED Final   Enterobacterales NOT DETECTED NOT DETECTED Final   Enterobacter cloacae complex NOT DETECTED NOT DETECTED Final   Escherichia coli NOT DETECTED NOT DETECTED Final   Klebsiella aerogenes NOT DETECTED NOT DETECTED Final   Klebsiella oxytoca NOT DETECTED NOT DETECTED Final   Klebsiella pneumoniae NOT DETECTED NOT DETECTED Final   Proteus species NOT DETECTED NOT DETECTED Final   Salmonella species NOT DETECTED NOT DETECTED Final   Serratia marcescens NOT DETECTED NOT DETECTED Final   Haemophilus influenzae NOT DETECTED NOT DETECTED Final   Neisseria meningitidis NOT DETECTED NOT DETECTED Final   Pseudomonas aeruginosa NOT DETECTED NOT DETECTED Final   Stenotrophomonas maltophilia NOT DETECTED NOT DETECTED Final   Candida albicans NOT DETECTED NOT DETECTED Final   Candida auris NOT DETECTED NOT DETECTED Final   Candida glabrata NOT DETECTED NOT DETECTED Final   Candida krusei NOT DETECTED NOT DETECTED Final   Candida parapsilosis NOT DETECTED NOT DETECTED Final   Candida tropicalis NOT DETECTED NOT DETECTED Final   Cryptococcus neoformans/gattii NOT DETECTED NOT DETECTED Final    Vancomycin resistance NOT DETECTED NOT DETECTED Final    Comment: Performed at [facility], [place] [address] [place], [place] [address]  Urine Culture     Status: None   Collection Time: 08/15/21  6:57 PM   Specimen: In/Out Cath Urine  Result Value Ref Range Status   Specimen Description IN/OUT CATH URINE  Final   Special Requests NONE  Final   Culture   Final    NO GROWTH Performed at [facility], [place] [address] [place], [place] [address]    Report Status 08/17/2021 FINAL  Final  MRSA Next Gen by PCR, Nasal     Status: None   Collection Time: 08/15/21 11:30 PM   Specimen: Nasal Mucosa; Nasal Swab  Result Value Ref Range Status   MRSA by PCR Next Gen NOT DETECTED NOT DETECTED Final    Comment: (  NOTE) The GeneXpert MRSA Assay (FDA approved for NASAL specimens only), is one component of a comprehensive MRSA colonization surveillance program. It is not intended to diagnose MRSA infection nor to guide or monitor treatment for MRSA infections. Test performance is not FDA approved in patients less than 45 years old. Performed at [facility], [place] [address] [place], [place] [address]   Culture, blood (Routine X 2) w Reflex to ID Panel     Status: None (Preliminary result)   Collection Time: 08/16/21  2:57 PM   Specimen: BLOOD  Result Value Ref Range Status   Specimen Description BLOOD SITE NOT SPECIFIED  Final   Special Requests   Final    AEROBIC BOTTLE ONLY Blood Culture results may not be optimal due to an inadequate volume of blood received in culture bottles   Culture   Final    NO GROWTH 2 DAYS Performed at [facility], [place] [address] [place], [place] [address]    Report Status PENDING  Incomplete  Culture, blood (Routine X 2) w Reflex to ID Panel     Status: None (Preliminary result)   Collection Time: 08/16/21  2:58 PM   Specimen: BLOOD  Result Value Ref Range Status   Specimen Description BLOOD SITE NOT SPECIFIED  Final   Special Requests    Final    AEROBIC BOTTLE ONLY Blood Culture results may not be optimal due to an inadequate volume of blood received in culture bottles   Culture   Final    NO GROWTH 2 DAYS Performed at [facility], [place] [address] [place], [place] [address]    Report Status PENDING  Incomplete      Radiology Studies: DG Chest 1 View  Result Date: 08/17/2021 CLINICAL DATA:  Edema EXAM: CHEST  1 VIEW COMPARISON:  Chest x-ray 08/15/2021, CT 08/16/2021, radiograph 04/15/2021 FINDINGS: Post sternotomy changes with aortic stent graft. Emphysema and chronic interstitial disease. Left-sided pleural effusion, similar right pleural effusion, effusions appears slightly loculated. Peripheral parenchymal opacities are also unchanged and could be due to chronic scarring. Cardiomegaly with vascular congestion. IMPRESSION: 1. Chronic bilateral effusions with slight increased pleural effusion on the left compared to prior. 2. Cardiomegaly with slight central congestion 3. Emphysema with chronic interstitial disease. Electronically Signed   By: Donavan Foil M.D.   On: 08/17/2021 16:56   CT ABDOMEN PELVIS W CONTRAST  Result Date: 08/17/2021 CLINICAL DATA:  Sepsis EXAM: CT ABDOMEN AND PELVIS WITH CONTRAST TECHNIQUE: Multidetector CT imaging of the abdomen and pelvis was performed using the standard protocol following bolus administration of intravenous contrast. CONTRAST:  154mL OMNIPAQUE IOHEXOL 300 MG/ML  SOLN COMPARISON:  01/29/2021 FINDINGS: Lower chest: Cardiomegaly. Unchanged small, loculated bilateral pleural effusions associated rounded atelectasis. Hepatobiliary: No solid liver abnormality is seen. Gallstones and sludge contracted in the gallbladder. Gallbladder wall thickening, or biliary dilatation. Pancreas: Unremarkable. No pancreatic ductal dilatation or surrounding inflammatory changes. Spleen: Normal in size without significant abnormality. Adrenals/Urinary Tract: Adrenal glands are unremarkable. Kidneys are  normal, without renal calculi, solid lesion, or hydronephrosis. Foley catheter in the urinary bladder. Thickening of the urinary bladder wall, likely related to chronic outlet obstruction. Stomach/Bowel: Stomach is within normal limits. Appendix is not clearly visualized. No evidence of bowel wall thickening, distention, or inflammatory changes. Descending and sigmoid diverticulosis. Vascular/Lymphatic: Aortic atherosclerosis. 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 The abdominal aorta  measures up to 4.7 x 4.0 cm in maximum caliber near the diaphragmatic hiatus. No enlarged abdominal or pelvic lymph nodes. Reproductive: Prostatomegaly. Other: Anasarca.  No abdominopelvic ascites. Musculoskeletal: No acute or significant osseous findings. IMPRESSION: 1. No acute CT findings of the abdomen or pelvis to explain sepsis. 2. Gallstones and sludge contracted in the gallbladder. No CT evidence of acute cholecystitis. 3. Descending and sigmoid diverticulosis without evidence of acute diverticulitis. 4. 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 The abdominal aorta measures up to 4.7 x 4.0 cm in maximum caliber near the diaphragmatic hiatus. Aortic Atherosclerosis (ICD10-I70.0). Electronically Signed   By: Delanna Ahmadi M.D.   On: 08/17/2021 16:25       LOS: 3 days   [facility] Pager on [email or web address]  08/18/2021, 11:17 AM

## 2021-08-18 NOTE — Assessment & Plan Note (Addendum)
Secondary to Enterococcus faecalis bacteremia.  Source is unclear.  Concern for infective endocarditis.  Infectious diseases following.  Patient is noted to be on ampicillin and ceftriaxone. Due to tenuous respiratory status TEE has not been done yet. WBC remains normal.  He remains afebrile.   Underwent CT scan of the abdomen pelvis which did not show any obvious source.  He was found to have cholelithiasis without cholecystitis.  Diverticulosis was noted without diverticulitis.  Abdomen is benign. Continue to monitor.

## 2021-08-18 NOTE — Consult Note (Addendum)
Advanced Heart Failure Team Consult Note   Primary Physician: Colon Branch, MD PCP-Cardiologist:  Dr. 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 Tamala Julian, PCCM.   71 y.o. with history of COPD, paroxysmal atrial fibrillation, type A aortic dissection, and chronic diastolic CHF. In 8/18, he had a Type A aortic dissection.  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  The dissection extended down to the abdominal aorta.  He was a heavy smoker, quit in 8/18.  He developed paroxysmal atrial fibrillation, had DCCV in 10/19.  He is on Eliquis and flecainide now. Echo in 2/20 showed EF 55%, mildly decreased RV systolic function, normally functioning bioprosthetic aortic valve. PFTs in 6/19 showed moderate-severe obstruction.     Ever since his operation in 2018, Sean Riley has had significant exertional dyspnea and has been quite limited.     He had RHC in 11/20, showing normal filling pressures, preserved cardiac output, and mild primarily pulmonary venous hypertension.     PYP scan was not suggestive of cardiac amyloidosis.    He saw pulmonary, PFTs showed moderate to severe obstruction.  He is on home O2 with exertion now. He is using CPAP.    Echo in 5/21 showed EF 55%, basal inferior akinesis, mild RV dilation with normal function, PASP 46 mmHg, s/p Bentall, s/p bioprosthetic aortic valve with mean gradient 16 mmHg.    11/21 had DCCV to NSR.  Had been in atrial fibrillation for a few weeks.    He was admitted in 1/22 with CHF exacerbation and diuresed.  Echo in 1/22 showed EF 60-65%, moderate RV enlargement with mildly decreased RV systolic function, PASP  44, bioprosthetic AVR with mean gradient 18 mmHg.    Cardiomems implant 5/22.     Patient was admitted in 6/22 with GI bleeding.  EGD, colonoscopy, and capsule endoscopy were all unremarkable.  No source of bleeding identified.   This admit, presented to the ED on 1/6 w/ CC of generalized malaise, cough and AMS. In ED, found to be hypoxic w/ O2 sats 83% despite 4L [place], requiring BiPAP. Also found to be febrile w/ mTemp 102.1, hypotensive w/ SBPs in the 80s and WBC 11.3 meeting sepsis criteria. CXR showed possible LLL PNA. PCT 0.78. Lactic acid 1.3. Head CT and chest CT both negative. Noted to be in afib w/ RVR. Also w/ AKI, admit SCr 1.94 (baseline ~1.4). UA, BCx were obtained and he was started on broad spectrum abx.   Bcx grew enterococcus faecalis. Source uncertain. CT of A/P w/ no acute findings.  UA/UCx negative. ID consulted. Abx changed to high dose ceftriaxone and ampicillin, treating presumptively for prosthetic valve endocarditis.  TTE shows normal LVEF 55-60%, RV moderately enlarged w/ moderately elevated RVSP 55.4 mmHg, RA severely dilated, mod TR, mild Sean. Prosthetic AoV not well visualized, poor acoustic windows. Peak and mean gradients through the valve are 5 and 2 mm Hg respectively. Previous echo in May 2022, peak and mean gradients were 34 and 18 mm Hg respectively. TEE not done yet due to respiratory status and current oxygen requirements due to a/c hypoxic respiratory failure. He is currently on 8L HFNC (baseline 4L/min)   Today he is  afebrile. WBC 11>>9>>8. PCT 0.78>>0.51. AKI resolved, SCr down 1.94>>1.05 today. Feels more SOB today. Wt up 4 lb since admit, from IVF resuscitation. Currently in NSR w/ frequent PACs. AHF team consulted at family request.     Echo TTE 08/16/20 Basal septal hypokinesis . Left ventricular ejection fraction, by estimation, is 55 to 60%. The left ventricle has normal function. The left ventricular internal cavity size was mildly dilated. There is mild  left ventricular hypertrophy. 1. Right ventricular systolic function is low normal. The right ventricular size is moderately enlarged. There is moderately elevated pulmonary artery systolic pressure. 2. 3. Right atrial size was severely dilated. 4. Mild mitral valve regurgitation. 5. Tricuspid valve regurgitation is moderate. Poor acoustic windows limit study. 25 mm Edwards bovine valve present (03/23/2017) Difficult to see well. Peak and mean gradients through the valve are 5 and 2 mm Hg respectively Previous echo in May 2022, peak and mean gradients were 34 and 18 mm Hg respectively. Poor windows from current study, may not have attained optimal angle to measure gradients. Follow over time. . The aortic valve has been repaired/replaced. Aortic valve regurgitation is not visualized. Aortic valve sclerosis/calcification is present, without any evidence of aortic stenosis. 6. The inferior vena cava is dilated in size with <50% respiratory variability, suggesting right atrial pressure of 15 mmHg.  Review of Systems: [y] = yes, [ ]  = no   General: Weight gain [ Y]; Weight loss [ ] ; Anorexia [ ] ; Fatigue [ Y]; Fever [Y ]; Chills [ ] ; Weakness [Y ]  Cardiac: Chest pain/pressure [ ] ; Resting SOB [Y ]; Exertional SOB [Y ]; Orthopnea [ ] ; Pedal Edema [ ] ; Palpitations [ ] ; Syncope [ ] ; Presyncope [ ] ; Paroxysmal nocturnal dyspnea[ ]   Pulmonary: Cough [ Y]; Wheezing[ ] ; Hemoptysis[ ] ; Sputum [ ] ; Snoring [ ]   GI: Vomiting[ ] ; Dysphagia[ ] ; Melena[ ] ; Hematochezia [ ] ; Heartburn[ ] ; Abdominal pain [ ] ; Constipation [ ] ; Diarrhea [ ] ; BRBPR [ ]   GU: Hematuria[ ] ; Dysuria [ ] ; Nocturia[ ]   Vascular: Pain in legs with walking [ ] ; Pain in feet with lying flat [ ] ; Non-healing sores [ ] ; Stroke [ ] ; TIA [ ] ; Slurred speech [ ] ;  Neuro: Headaches[ ] ; Vertigo[ ] ; Seizures[ ] ; Paresthesias[ ] ;Blurred vision [ ] ; Diplopia [ ] ; Vision changes [ ]   Ortho/Skin: Arthritis [ ] ; Joint pain [ ] ; Muscle pain [ ] ;  Joint swelling [ ] ; Back Pain [ ] ; Rash [ ]   Psych: Depression[ ] ; Anxiety[ ]   Heme: Bleeding problems [ ] ; Clotting disorders [ ] ; Anemia [ ]   Endocrine: Diabetes [ ] ; Thyroid dysfunction[ ]   Home Medications Prior to Admission medications   Medication Sig Start Date End Date Taking? Authorizing Provider  albuterol (PROAIR HFA) 108 (90 Base) MCG/ACT inhaler Inhale 2 puffs into the lungs every 4 (four) hours as needed for wheezing or shortness of breath. 06/09/21  Yes Mannam, Praveen, MD  ALPRAZolam (XANAX) 1 MG tablet TAKE 1 TABLET BY MOUTH AT BEDTIME. Patient taking differently: Take 1 mg by mouth at bedtime. 06/17/21  Yes Paz, Alda Berthold, MD  bisacodyl (DULCOLAX) 5 MG EC tablet Take 5 mg by mouth daily as needed for moderate constipation.   Yes [provider]  Budeson-Glycopyrrol-Formoterol (BREZTRI AEROSPHERE) 160-9-4.8 MCG/ACT AERO Inhale 2 puffs into the lungs in the morning and at bedtime. 06/09/21  Yes Mannam, Praveen, MD  escitalopram (LEXAPRO) 10 MG tablet TAKE 1 TABLET DAILY Patient taking differently: Take 10 mg by mouth  daily. 09/19/20  Yes Colon Branch, MD  esomeprazole (NEXIUM) 40 MG capsule Take 1 capsule (40 mg total) by mouth daily before breakfast. 01/05/17  Yes Kozlow, Donnamarie Poag, MD  flecainide (TAMBOCOR) 50 MG tablet TAKE ONE AND ONE-HALF TABLETS TWICE A DAY Patient taking differently: Take 75 mg by mouth 2 (two) times daily. 11/29/20  Yes Deboraha Sprang, MD  fluticasone [facility]) 50 MCG/ACT nasal spray Place 1 spray into both nostrils 2 (two) times daily as needed for allergies. Use one spray in each nostril twice daily. 05/31/18  Yes Turner, Eber Hong, MD  hydroxypropyl methylcellulose / hypromellose (ISOPTO TEARS / GONIOVISC) 2.5 % ophthalmic solution Place 1-2 drops into both eyes 3 (three) times daily as needed (dry/irritated eyes).   Yes [provider]  ipratropium-albuterol (DUONEB) 0.5-2.5 (3) MG/3ML SOLN Take 3 mLs by nebulization every 4 (four) hours as  needed. Patient taking differently: Take 3 mLs by nebulization every 4 (four) hours as needed (shortness of breath). 03/12/21  Yes Mannam, Praveen, MD  JARDIANCE 10 MG TABS tablet TAKE 1 TABLET DAILY BEFORE BREAKFAST Patient taking differently: Take 10 mg by mouth daily. 04/23/21  Yes Larey Dresser, MD  magnesium hydroxide (MILK OF MAGNESIA) 400 MG/5ML suspension Take 7.5 mLs by mouth daily as needed (stomach cramps/constipation).   Yes [provider]  metoprolol tartrate (LOPRESSOR) 25 MG tablet Take 0.5 tablets (12.5 mg total) by mouth 2 (two) times daily. Please make yearly appt with Dr. Caryl Comes for January 2022 for future refills. Thank you 1st attempt 09/08/20  Yes Shelly Coss, MD  OXYGEN Inhale 3-4 L/min into the lungs continuous.   Yes [provider]  potassium chloride SA (KLOR-CON M) 20 MEQ tablet Take 40 mEq by mouth at bedtime.   Yes [provider]  rosuvastatin (CRESTOR) 5 MG tablet TAKE 1 TABLET DAILY Patient taking differently: Take 5 mg by mouth at bedtime. 06/24/21  Yes Paz, Alda Berthold, MD  torsemide (DEMADEX) 20 MG tablet Take 4 tablets (80 mg total) by mouth daily. Patient taking differently: Take 60 mg by mouth daily. 04/09/21  Yes Larey Dresser, MD  potassium chloride SA (KLOR-CON M20) 20 MEQ tablet Take 2 tablets (40 mEq total) by mouth every morning AND 1 tablet (20 mEq total) every evening. Patient not taking: Reported on 08/15/2021 04/09/21   Larey Dresser, MD  triamcinolone cream (KENALOG) 0.5 % Apply 1 application topically 3 (three) times daily. Patient not taking: Reported on 08/15/2021 07/17/21   Colon Branch, MD    Past Medical History: 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   Atrial fibrillation ([place])    Cataract    removed both eyes    CHF (congestive heart failure) (HCC)    GERD (gastroesophageal reflux disease)    HYPERLIPIDEMIA    HYPERPLASIA PROSTATE UNS W/O UR  OBST & OTH LUTS    Hypertension    Microscopic hematuria    negative cystoscopy   NONSPECIFIC ABNORMAL ELECTROCARDIOGRAM    Pleural effusion on right    Pleural effusion, bilateral    S/P aortic dissection repair 03/24/2017   Biological Bentall aortic root replacement + resection and grafting of entire ascending aorta, transverse aortic arch and proximal descending thoracic aorta with elephant trunk distal anastomosis and debranching of aortic arch vessels   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   25 mm [employer] Ease bovine pericardial  tissue valve and 28 mm Gelweave Valsalva aortic root graft with reimplantation of left main and right coronary arteries   Varicose veins     Past Surgical History: Past Surgical History:  Procedure Laterality Date   APPENDECTOMY     CARDIAC CATHETERIZATION      X3;Dr Caryl Comes, last 01/20/12: mild non-obstructive CAD, normal LV systolic function   CARDIOVERSION N/A 05/31/2018   Procedure: CARDIOVERSION;  Surgeon: Sueanne Margarita, MD;  Location: [facility] ENDOSCOPY;  Service: Cardiovascular;  Laterality: N/A;   CARDIOVERSION N/A 06/13/2020   Procedure: CARDIOVERSION;  Surgeon: Larey Dresser, MD;  Location: [facility] ENDOSCOPY;  Service: Cardiovascular;  Laterality: N/A;   COLONOSCOPY     COLONOSCOPY N/A 01/27/2021   Procedure: COLONOSCOPY;  Surgeon: Gatha Mayer, MD;  Location: [facility] ENDOSCOPY;  Service: Endoscopy;  Laterality: N/A;   CYSTOSCOPY  1978   Dr Hartley Barefoot   ESOPHAGOGASTRODUODENOSCOPY N/A 01/27/2021   Procedure: ESOPHAGOGASTRODUODENOSCOPY (EGD);  Surgeon: Gatha Mayer, MD;  Location: [facility] ENDOSCOPY;  Service: Endoscopy;  Laterality: N/A;   EYE SURGERY     GIVENS CAPSULE STUDY N/A 01/27/2021   Procedure: GIVENS CAPSULE STUDY;  Surgeon: Gatha Mayer, MD;  Location: [place];  Service: Endoscopy;  Laterality: N/A;   IR THORACENTESIS ASP PLEURAL SPACE W/IMG GUIDE  11/25/2017   KNEE ARTHROSCOPY  2012   Dr Theda Sers    LUMBAR LAMINECTOMY  1987   Dr Lestine Box  2007   PERICARDIOCENTESIS N/A 04/07/2017   Procedure: PERICARDIOCENTESIS;  Surgeon: Sherren Mocha, MD;  Location: [place] CV LAB;  Service: Cardiovascular;  Laterality: N/A;   POLYPECTOMY     POLYPECTOMY  01/27/2021   Procedure: POLYPECTOMY;  Surgeon: Gatha Mayer, MD;  Location[facility] ENDOSCOPY;  Service: Endoscopy;;   PRESSURE SENSOR/CARDIOMEMS N/A 12/26/2020   Procedure: PRESSURE SENSOR/CARDIOMEMS;  Surgeon: Larey Dresser, MD;  Location: [place] CV LAB;  Service: Cardiovascular;  Laterality: N/A;   REPAIR OF ACUTE ASCENDING THORACIC AORTIC DISSECTION N/A 03/23/2017   Procedure: REPAIR OF ACUTE ASCENDING THORACIC AORTIC DISSECTION.  Bentall procedure.  Aortic root repleacement with bioprosthetic valve.  Reimplantation of left and right coronary arteries.  Total resection of transverse aortic arch.  Elephant trunk distal anastomosis and debranching of arch vessels.;  Surgeon: Rexene Alberts, MD;  Location: [place];  Service: Vascular;  Laterality: N/A;   RIGHT HEART CATH N/A 06/15/2019   Procedure: RIGHT HEART CATH;  Surgeon: Larey Dresser, MD;  Location: [place] CV LAB;  Service: Cardiovascular;  Laterality: N/A;   ROTATOR CUFF REPAIR     SHOULDER ARTHROSCOPY  2011   Dr Theda Sers   SHOULDER ARTHROSCOPY Right 08/2015   SPINAL FUSION  1986   Dr Rolin Barry   TEE WITHOUT CARDIOVERSION N/A 03/23/2017   Procedure: TRANSESOPHAGEAL ECHOCARDIOGRAM (TEE);  Surgeon: Rexene Alberts, MD;  Location: [place];  Service: Open Heart Surgery;  Laterality: N/A;   THORACIC AORTIC ENDOVASCULAR STENT GRAFT N/A 03/30/2018   Procedure: THORACIC AORTIC ENDOVASCULAR STENT GRAFT WITH INTRAVASCULAR ULTRASOUND;  Surgeon: Serafina Mitchell, MD;  Location: [facility] OR;  Service: Vascular;  Laterality: N/A;   TRACHEOSTOMY     age 61 for croup    Family History: Family History  Problem Relation Age of Onset   Hypertension Mother    Hypertension Father    Benign  prostatic hyperplasia Father        S/P TURP   Heart attack Maternal Grandmother        MI in 24s  Breast cancer Maternal Grandmother    Arrhythmia Brother         X 2   Heart attack Maternal Aunt        MI in 61s   Stroke Neg Hx    Diabetes Neg Hx    Colon cancer Neg Hx    Colon polyps Neg Hx    Stomach cancer Neg Hx    Rectal cancer Neg Hx    Esophageal cancer Neg Hx    Prostate cancer Neg Hx    Other Neg Hx        gynecomastia    Social History: Social History   Socioeconomic History   Marital status: Married    Spouse name: Not on file   Number of children: 2   Years of education: Not on file   Highest education level: Not on file  Occupational History   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   Tobacco Use   Smoking status: Former    Packs/day: 2.00    Years: 45.00    Pack years: 90.00    Types: Cigarettes    Quit date: 03/22/2017    Years since quitting: 4.4   Smokeless tobacco: Never   Tobacco comments:    2 ppd , quit 03/2017  Vaping Use   Vaping Use: Never used  Substance and Sexual Activity   Alcohol use: Not Currently   Drug use: No   Sexual activity: Never  Other Topics Concern   Not on file  Social History Narrative   Lives w/ wife   Social Determinants of Health   Financial Resource Strain: Not on file  Food Insecurity: Not on file  Transportation Needs: Not on file  Physical Activity: Not on file  Stress: Not on file  Social Connections: Not on file    Allergies:  Allergies  Allergen Reactions   Simvastatin Other (See Comments)    Mental status changes   Celecoxib Other (See Comments)    GI UPSET AND INFLAMMATION   Codeine Other (See Comments)    GI UPSET AND INFLAMMATION   Nsaids Other (See Comments)    GI UPSET AND INFLAMMATION (can tolerate via IV)   Tape Other (See Comments)    Medical tape and Band-Aids PULL OFF THE SKIN; please use Coban wrap   Cephalexin Itching and Rash   Latex Rash    Objective:    Vital  Signs:   Temp:  [96.3 F (35.7 C)-97.5 F (36.4 C)] 96.3 F (35.7 C) (01/09 0300) Pulse Rate:  [49-129] 86 (01/09 0900) Resp:  [16-26] 18 (01/09 0900) BP: (102-142)/(50-101) 124/78 (01/09 0900) SpO2:  [83 %-98 %] 93 % (01/09 0900) Weight:  [88.3 kg] 88.3 kg (01/09 0351) Last BM Date: 08/17/21  Weight change: Filed Weights   08/16/21 0500 08/17/21 0500 08/18/21 0351  Weight: 86.8 kg 85.4 kg 88.3 kg    Intake/Output:   Intake/Output Summary (Last 24 hours) at 08/18/2021 0915 Last data filed at 08/18/2021 0900 Gross per 24 hour  Intake 1090.74 ml  Output 1670 ml  Net -579.26 ml      Physical Exam    General:  fatigued appearing. No resp difficulty HEENT: normal Neck: supple. JVP 9-10 cm . Carotids 2+ bilat; no bruits. No lymphadenopathy or thyromegaly appreciated. Cor: PMI nondisplaced. Irregular rhythm (PACs). 2/6 TR murmur Lungs: clear Abdomen: soft, nontender, nondistended. No hepatosplenomegaly. No bruits or masses. Good bowel sounds. Extremities: no cyanosis, clubbing, rash, edema Neuro: alert & orientedx3, cranial nerves grossly intact.  moves all 4 extremities w/o difficulty. Affect pleasant   Telemetry   NSR w/ frequent PACs, 90s   EKG    No new EKG to review today   Labs   Basic Metabolic Panel: Recent Labs  Lab 08/15/21 1650 08/15/21 1743 08/16/21 0149 08/16/21 0440 08/17/21 1031 08/18/21 0029  NA 129* 131* 133* 133* 134* 136  K 3.4* 3.4* 3.7 4.3 3.4* 3.9  CL 95*  --  98  --  100 107  CO2 24  --  24  --  25 25  GLUCOSE 155*  --  128*  --  132* 105*  BUN 31*  --  27*  --  24* 16  CREATININE 1.94*  --  1.61*  --  1.19 1.05  CALCIUM 8.8*  --  8.4*  --  8.2* 7.7*  MG  --   --  2.0  --  2.1  --   PHOS  --   --  3.9  --   --   --     Liver Function Tests: Recent Labs  Lab 08/15/21 1650  AST 68*  ALT 36  ALKPHOS 119  BILITOT 1.4*  PROT 7.2  ALBUMIN 3.2*   No results for input(s): LIPASE, AMYLASE in the last 168 hours. Recent Labs  Lab  08/16/21 0149  AMMONIA 31    CBC: Recent Labs  Lab 08/15/21 1650 08/15/21 1743 08/16/21 0149 08/16/21 0440 08/16/21 0601 08/17/21 0246 08/18/21 0029  WBC 11.3*  --  10.4  --  13.8* 9.1 8.2  NEUTROABS 10.1*  --   --   --   --   --   --   HGB 12.1*   < > 10.7* 11.6* 11.0* 11.0* 10.9*  HCT 37.1*   < > 34.2* 34.0* 36.7* 34.0* 33.8*  MCV 82.1  --  84.4  --  86.6 82.1 83.5  PLT 189  --  137*  --  147* 180 180   < > = values in this interval not displayed.    Cardiac Enzymes: No results for input(s): CKTOTAL, CKMB, CKMBINDEX, TROPONINI in the last 168 hours.  BNP: BNP (last 3 results) Recent Labs    01/29/21 1520 04/09/21 1123 08/16/21 0149  BNP 170.9* 147.0* 303.9*    ProBNP (last 3 results) No results for input(s): PROBNP in the last 8760 hours.   CBG: Recent Labs  Lab 08/17/21 1549 08/17/21 1945 08/17/21 2313 08/18/21 0341 08/18/21 0737  GLUCAP 142* 97 114* 107* 102*    Coagulation Studies: Recent Labs    08/15/21 1650  LABPROT 19.0*  INR 1.6*     Imaging   DG Chest 1 View  Result Date: 08/17/2021 CLINICAL DATA:  Edema EXAM: CHEST  1 VIEW COMPARISON:  Chest x-ray 08/15/2021, CT 08/16/2021, radiograph 04/15/2021 FINDINGS: Post sternotomy changes with aortic stent graft. Emphysema and chronic interstitial disease. Left-sided pleural effusion, similar right pleural effusion, effusions appears slightly loculated. Peripheral parenchymal opacities are also unchanged and could be due to chronic scarring. Cardiomegaly with vascular congestion. IMPRESSION: 1. Chronic bilateral effusions with slight increased pleural effusion on the left compared to prior. 2. Cardiomegaly with slight central congestion 3. Emphysema with chronic interstitial disease. Electronically Signed   By: Donavan Foil M.D.   On: 08/17/2021 16:56   CT ABDOMEN PELVIS W CONTRAST  Result Date: 08/17/2021 CLINICAL DATA:  Sepsis EXAM: CT ABDOMEN AND PELVIS WITH CONTRAST TECHNIQUE: Multidetector CT  imaging of the abdomen and pelvis was performed using the standard protocol following  bolus administration of intravenous contrast. CONTRAST:  171mL OMNIPAQUE IOHEXOL 300 MG/ML  SOLN COMPARISON:  01/29/2021 FINDINGS: Lower chest: Cardiomegaly. Unchanged small, loculated bilateral pleural effusions associated rounded atelectasis. Hepatobiliary: No solid liver abnormality is seen. Gallstones and sludge contracted in the gallbladder. Gallbladder wall thickening, or biliary dilatation. Pancreas: Unremarkable. No pancreatic ductal dilatation or surrounding inflammatory changes. Spleen: Normal in size without significant abnormality. Adrenals/Urinary Tract: Adrenal glands are unremarkable. Kidneys are normal, without renal calculi, solid lesion, or hydronephrosis. Foley catheter in the urinary bladder. Thickening of the urinary bladder wall, likely related to chronic outlet obstruction. Stomach/Bowel: Stomach is within normal limits. Appendix is not clearly visualized. No evidence of bowel wall thickening, distention, or inflammatory changes. Descending and sigmoid diverticulosis. Vascular/Lymphatic: Aortic atherosclerosis. 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 The abdominal aorta measures up to 4.7 x 4.0 cm in maximum caliber near the diaphragmatic hiatus. No enlarged abdominal or pelvic lymph nodes. Reproductive: Prostatomegaly. Other: Anasarca.  No abdominopelvic ascites. Musculoskeletal: No acute or significant osseous findings. IMPRESSION: 1. No acute CT findings of the abdomen or pelvis to explain sepsis. 2. Gallstones and sludge contracted in the gallbladder. No CT evidence of acute cholecystitis. 3. Descending and sigmoid diverticulosis without evidence of acute diverticulitis. 4. 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 The abdominal aorta measures up to 4.7 x 4.0 cm in maximum caliber near the diaphragmatic hiatus. Aortic Atherosclerosis (ICD10-I70.0). Electronically Signed   By: Delanna Ahmadi M.D.   On: 08/17/2021 16:25     Medications:     Current Medications:  ALPRAZolam  1 mg Oral QHS   Chlorhexidine Gluconate Cloth  6 each Topical Daily   escitalopram  10 mg Oral Daily   flecainide  75 mg Oral BID   furosemide  40 mg Intravenous Once   insulin aspart  0-15 Units Subcutaneous Q4H   mouth rinse  15 mL Mouth Rinse BID   metoprolol tartrate  12.5 mg Oral BID   pantoprazole  40 mg Oral QHS   rosuvastatin  5 mg Oral Daily   triamcinolone cream   Topical BID   umeclidinium-vilanterol  1 puff Inhalation Daily    Infusions:  sodium chloride Stopped (08/16/21 0004)   ampicillin (OMNIPEN) IV 300 mL/hr at 08/18/21 0900   cefTRIAXone (ROCEPHIN)  IV Stopped (08/18/21 0622)   heparin 1,350 Units/hr (08/18/21 0900)      Patient Profile   71 y/o male w/ chronic diastolic heart failure, Type A aortic dissection, s/p Bentall with bioprosthetic aortic valve in 2018, PAF, COPD and chronic hypoxic respiratory failure on home O2, admitted w/ sepsis/bacteremia. Blood Cx + for enterococcus faecalis  Assessment/Plan   1. 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 Source uncertain. CT of A/P w/ no acute findings.  UA/UCx negative. CXR ? LLL pneumonia. Initial lactate 1.3. PCT 0.78>>0.51. WBC 11>>8K. Fevers resolved. Treating presumptively for endocarditis. Has bioprosthetic AoV. TTE w/ mod TR, mild Sean. Prosthetic AoV not well visualized due to  poor acoustic windows. TEE not done yet due to respiratory status and high O2 requirements  - ID now following, on ceftriaxone and ampicillin - needs TEE once respiratory status improves  2. Acute on Chronic Hypoxic Respiratory Failure: multifactorial. Moderate COPD on home  O2 (4L/min baseline) + chronic diastolic heart failure. Increased O2 requirements this  admit, currently on 8L HFNC, in setting of sepsis s/p IVF resuscitation and ? LLL PNA on CXR. Suspect mild fluid overload post IVF resuscitation - Give IV Lasix for diuresis. 80 mg x 1 and observe response  - continue abx per ID  - wean [place] as tolerated  3. Acute on Chronic Diastolic Heart Failure: Echo in 2/20 with EF 55%, mildly decreased RV systolic function, normally functioning bioprosthetic aortic valve. RHC in 11/20 after increasing torsemide showed near-normal filling pressures.  PYP study was not suggestive of ATTR amyloidosis and myeloma panel was negative. Echo in 1/22 showed EF 60-65%, moderate RV enlargement with mildly decreased RV systolic function, PASP 44, bioprosthetic AVR with mean gradient 18 mmHg. Echo this admit, EF 55-60% RV moderately enlarged w/ moderately elevated RVSP 55.4 mmHg, RA severely dilated, mod TR, mild Sean. Prosthetic AoV not well visualized, poor acoustic windows. Peak and mean gradients through the valve are 5 and 2 mm Hg respectively. Previous echo in May 2022, peak and mean gradients were 34 and 18 mm Hg respectively. Diuretics held on admit due to AKI, hypotension and sepsis. Now s/p IVF resuscitation. AKI resolved and now w/ mild fluid overload on exam.  - diurese w/ IV Lasix, 80 mg x1. Follow response  - continue metoprolol 12.5 mg bid  4. AKI: admit SCr 1.9 (baseline ~1.4). In setting of sepsis/ hypotension. Now resolved w/ treatment of sepsis/ IVFs. SCr 1.05 today  - follow BMP   5. 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 Roxy Manns. Echo 1/22 showed stable bioprosthetic aortic valve.  Dr. Trula Slade has been following his residual dissected aorta.  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 6. Atrial fibrillation: Paroxysmal.  S/p DCCV in 11/21.  Maintaining NSR currently w/ frequent  PACs. HR controlled.     - Continue flecainide + metoprolol.  - Eliquis 5 mg bid PTA. On heparin gtt for now  7. COPD: Moderate to severe obstruction on 6/19 PFTs, prior smoker.  He is on home oxygen. Followed by pulmonology   4. OSA: CPAP QHS.    Length of Stay: [address] [place]  08/18/2021, 9:15 AM  Advanced Heart Failure Team Pager [phone] (M-F; 7a - 5p)  Please contact [facility] for night-coverage after hours (4p -7a ) and weekends on [email or web address]  Patient seen and examined with the above-signed Advanced Practice Provider and/or Housestaff. I personally reviewed laboratory data, imaging studies and relevant notes. I independently examined the patient and formulated the important aspects of the plan. I have edited the note to reflect any of my changes or salient points. I have personally discussed the plan with the patient and/or family.  71 y/o male as above admitted with cellulitis and  enterococcal bacteremia/sepsis with AKI and PAF. Renal function now improved and maintaining NSR (with PACs) on flecainide. Echo normal. Unable to see AoV well.  He is confused so cannot provide too much reliable history. Denies CP or SOB  General:  Sitting up in bed  No resp difficulty HEENT: normal Neck: supple. JVP to ear Carotids 2+ bilat; no bruits. No lymphadenopathy or thryomegaly appreciated. Cor: PMI nondisplaced. Irregular rate & rhythm. No rubs, gallops or murmurs. Lungs: + crackles Abdomen: soft, nontender, nondistended. No hepatosplenomegaly. No bruits or masses. Good bowel sounds. Extremities: no cyanosis, clubbing, rash, 2+  edema L ankle rash Neuro: confused. Can answer some questions moves all 4  He has  severe enterococcal bacteremia/sepsis in setting of bioprosthetic AoV. Now improving on broad spectrum abx. He is markedly volume. Overloaded. Agree with diuresis. Once respiratory status improves will need TEE.   Continue heparin, abx and flecainide.   Dr. Aundra Dubin to  see tomorrow.   Glori Bickers, MD  8:57 PM

## 2021-08-19 DIAGNOSIS — R7881 Bacteremia: Secondary | ICD-10-CM | POA: Diagnosis not present

## 2021-08-19 DIAGNOSIS — B952 Enterococcus as the cause of diseases classified elsewhere: Secondary | ICD-10-CM | POA: Diagnosis not present

## 2021-08-19 DIAGNOSIS — J9621 Acute and chronic respiratory failure with hypoxia: Secondary | ICD-10-CM | POA: Diagnosis not present

## 2021-08-19 DIAGNOSIS — I5033 Acute on chronic diastolic (congestive) heart failure: Secondary | ICD-10-CM | POA: Diagnosis not present

## 2021-08-19 LAB — CBC
HCT: 34.6 % — ABNORMAL LOW (ref 39.0–52.0)
Hemoglobin: 10.8 g/dL — ABNORMAL LOW (ref 13.0–17.0)
MCH: 26.3 pg (ref 26.0–34.0)
MCHC: 31.2 g/dL (ref 30.0–36.0)
MCV: 84.4 fL (ref 80.0–100.0)
Platelets: 219 10*3/uL (ref 150–400)
RBC: 4.1 MIL/uL — ABNORMAL LOW (ref 4.22–5.81)
RDW: 16.1 % — ABNORMAL HIGH (ref 11.5–15.5)
WBC: 8.5 10*3/uL (ref 4.0–10.5)
nRBC: 0 % (ref 0.0–0.2)

## 2021-08-19 LAB — BASIC METABOLIC PANEL
Anion gap: 7 (ref 5–15)
BUN: 12 mg/dL (ref 8–23)
CO2: 27 mmol/L (ref 22–32)
Calcium: 7.9 mg/dL — ABNORMAL LOW (ref 8.9–10.3)
Chloride: 101 mmol/L (ref 98–111)
Creatinine, Ser: 1.19 mg/dL (ref 0.61–1.24)
GFR, Estimated: >60 mL/min (ref >60)
Glucose, Bld: 99 mg/dL (ref 70–99)
Potassium: 3.9 mmol/L (ref 3.5–5.1)
Sodium: 135 mmol/L (ref 135–145)

## 2021-08-19 LAB — GLUCOSE, CAPILLARY
Glucose-Capillary: 96 mg/dL (ref 70–99)
Glucose-Capillary: 88 mg/dL (ref 70–99)
Glucose-Capillary: 96 mg/dL (ref 70–99)
Glucose-Capillary: 106 mg/dL — ABNORMAL HIGH (ref 70–99)
Glucose-Capillary: 85 mg/dL (ref 70–99)
Glucose-Capillary: 143 mg/dL — ABNORMAL HIGH (ref 70–99)

## 2021-08-19 LAB — HEPARIN LEVEL (UNFRACTIONATED): Heparin Unfractionated: 0.38 IU/mL (ref 0.30–0.70)

## 2021-08-19 LAB — MAGNESIUM: Magnesium: 2 mg/dL (ref 1.7–2.4)

## 2021-08-19 MED ORDER — FUROSEMIDE 10 MG/ML IJ SOLN
40.0000 mg | Freq: Two times a day (BID) | INTRAMUSCULAR | Status: DC
  Administered 2021-08-19 – 2021-08-20 (×4): 40 mg via INTRAVENOUS
  Filled 2021-08-19 (×4): qty 4

## 2021-08-19 MED ORDER — POTASSIUM CHLORIDE CRYS ER 20 MEQ PO TBCR
40.0000 meq | EXTENDED_RELEASE_TABLET | Freq: Once | ORAL | Status: AC
  Administered 2021-08-19: 40 meq via ORAL
  Filled 2021-08-19: qty 2

## 2021-08-19 NOTE — Progress Notes (Addendum)
Advanced Heart Failure Rounding Note  PCP-Cardiologist: Dr. Aundra Dubin   Subjective:    On Ceftriaxone and ampicillin per ID. AF. WBC normal.    Repeat BCx from 1/16 NGTD   Diuretics started yesterday for volume overload. 4L in UOP. Wts unreliable.   SCr stable, 1.19. K 3.9  Tele, NSR w/ frequent PACs.   Feels slightly better. Less SOB but still w/ high O2 requirements, currently on 8 L (home baseline 3L). No subjective fever/chills.    Objective:   Weight Range: 88.7 kg Body mass index is 25.8 kg/m.   Vital Signs:   Temp:  [96.9 F (36.1 C)-98.2 F (36.8 C)] 97.6 F (36.4 C) (01/10 0734) Pulse Rate:  [48-105] 99 (01/10 0800) Resp:  [17-27] 25 (01/10 0800) BP: (94-147)/(54-86) 134/65 (01/10 0800) SpO2:  [90 %-97 %] 97 % (01/10 0800) Weight:  [88.7 kg] 88.7 kg (01/10 0500) Last BM Date: 08/18/21  Weight change: Filed Weights   08/17/21 0500 08/18/21 0351 08/19/21 0500  Weight: 85.4 kg 88.3 kg 88.7 kg    Intake/Output:   Intake/Output Summary (Last 24 hours) at 08/19/2021 0847 Last data filed at 08/19/2021 0519 Gross per 24 hour  Intake 1133.83 ml  Output 4050 ml  Net -2916.17 ml      Physical Exam    General:  Well appearing. No resp difficulty HEENT: Normal Neck: Supple. JVP 10 cm . Carotids 2+ bilat; no bruits. No lymphadenopathy or thyromegaly appreciated. Cor: PMI nondisplaced. Regular rate & rhythm. No rubs, gallops or murmurs. Lungs: Clear Abdomen: Soft, nontender, nondistended. No hepatosplenomegaly. No bruits or masses. Good bowel sounds. Extremities: No cyanosis, clubbing, rash, edema Neuro: Alert & orientedx3, cranial nerves grossly intact. moves all 4 extremities w/o difficulty. Affect pleasant   Telemetry   SR w/ frequent PACS  EKG    No new EKG to review   Labs    CBC Recent Labs    08/18/21 0029 08/19/21 0246  WBC 8.2 8.5  HGB 10.9* 10.8*  HCT 33.8* 34.6*  MCV 83.5 84.4  PLT 180 818   Basic Metabolic Panel Recent  Labs    08/17/21 1031 08/18/21 0029 08/19/21 0246  NA 134* 136 135  K 3.4* 3.9 3.9  CL 100 107 101  CO2 25 25 27   GLUCOSE 132* 105* 99  BUN 24* 16 12  CREATININE 1.19 1.05 1.19  CALCIUM 8.2* 7.7* 7.9*  MG 2.1  --   --    Liver Function Tests No results for input(s): AST, ALT, ALKPHOS, BILITOT, PROT, ALBUMIN in the last 72 hours. No results for input(s): LIPASE, AMYLASE in the last 72 hours. Cardiac Enzymes No results for input(s): CKTOTAL, CKMB, CKMBINDEX, TROPONINI in the last 72 hours.  BNP: BNP (last 3 results) Recent Labs    01/29/21 1520 04/09/21 1123 08/16/21 0149  BNP 170.9* 147.0* 303.9*    ProBNP (last 3 results) No results for input(s): PROBNP in the last 8760 hours.   D-Dimer No results for input(s): DDIMER in the last 72 hours. Hemoglobin A1C No results for input(s): HGBA1C in the last 72 hours. Fasting Lipid Panel No results for input(s): CHOL, HDL, LDLCALC, TRIG, CHOLHDL, LDLDIRECT in the last 72 hours. Thyroid Function Tests No results for input(s): TSH, T4TOTAL, T3FREE, THYROIDAB in the last 72 hours.  Invalid input(s): FREET3  Other results:   Imaging    No results found.   Medications:     Scheduled Medications:  ALPRAZolam  1 mg Oral QHS   Chlorhexidine  Gluconate Cloth  6 each Topical Daily   escitalopram  10 mg Oral Daily   flecainide  75 mg Oral BID   insulin aspart  0-15 Units Subcutaneous Q4H   mouth rinse  15 mL Mouth Rinse BID   metoprolol tartrate  12.5 mg Oral BID   pantoprazole  40 mg Oral QHS   rosuvastatin  5 mg Oral Daily   triamcinolone cream   Topical BID   umeclidinium-vilanterol  1 puff Inhalation Daily    Infusions:  sodium chloride Stopped (08/16/21 0004)   ampicillin (OMNIPEN) IV 2 g (08/19/21 0749)   cefTRIAXone (ROCEPHIN)  IV 2 g (08/19/21 0538)   heparin 1,400 Units/hr (08/19/21 0518)    PRN Medications: albuterol, bisacodyl, docusate sodium, fluticasone, magnesium hydroxide, ondansetron (ZOFRAN)  IV, polyethylene glycol, polyvinyl alcohol    Patient Profile   71 y/o male w/ chronic diastolic heart failure, Type A aortic dissection, 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 Blood Cx + for enterococcus faecalis  Assessment/Plan   1. 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 Source uncertain. ? LE cellulitis. CT of A/P w/ no acute findings.  UA/UCx negative. CXR ? LLL pneumonia. Initial lactate 1.3. PCT 0.78>>0.51. WBC 11>>8K. Fevers resolved. Treating presumptively for endocarditis. Has bioprosthetic AoV. TTE w/ mod TR, mild MR. Prosthetic AoV not well visualized due to  poor acoustic windows. TEE not done yet due to respiratory status and high O2 requirements  - ID now following, on ceftriaxone and ampicillin - needs TEE once respiratory status improves  2. Acute on Chronic Hypoxic Respiratory Failure: multifactorial. Moderate COPD on home O2 (4L/min baseline) + chronic diastolic heart failure. Increased O2 requirements this admit, currently on 8L HFNC, in setting of sepsis s/p IVF resuscitation and ? LLL PNA on CXR. Suspect mild fluid overload post IVF resuscitation - Continue to diurese w/ IV Lasix today 40 mg bid  - continue abx per ID  - wean [place] as tolerated  3. Acute on Chronic Diastolic Heart Failure: Echo in 2/20 with EF 55%, mildly decreased RV systolic function, normally functioning bioprosthetic aortic valve. RHC in 11/20 after increasing torsemide showed near-normal filling pressures.  PYP study was not suggestive of ATTR amyloidosis and myeloma panel was negative. Echo in 1/22 showed EF 60-65%, moderate RV enlargement with mildly decreased RV systolic function, PASP 44, bioprosthetic AVR with mean gradient 18 mmHg. Echo this admit, EF 55-60% RV moderately enlarged w/ moderately elevated RVSP 55.4 mmHg, RA severely dilated, mod  TR, mild MR. Prosthetic AoV not well visualized, poor acoustic windows. Peak and mean gradients through the valve are 5 and 2 mm Hg respectively. Previous echo in May 2022, peak and mean gradients were 34 and 18 mm Hg respectively. Diuretics held on admit due to AKI, hypotension and sepsis. Now s/p IVF resuscitation. AKI resolved and now w/ mild fluid overload on exam.  - diurese w/ IV Lasix, 40 mg bid  - continue metoprolol 12.5 mg bid  4. AKI: admit SCr 1.9 (baseline ~1.4). In setting of sepsis/ hypotension. Now resolved w/ treatment of sepsis/ IVFs. SCr 1.19 today  - follow BMP   5. 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 Roxy Manns. Echo 1/22 showed stable bioprosthetic aortic valve.  Dr. Trula Slade has been following his residual dissected aorta.  Chest CT this admit showed stable descending thoracic aortic aneurysm, 5.1  cm. 6. Atrial fibrillation: Paroxysmal.  S/p DCCV in 11/21.  Maintaining NSR currently w/ frequent PACs. HR controlled.     - Continue flecainide + metoprolol.  - Eliquis 5 mg bid PTA. On heparin gtt for now  7. COPD: Moderate to severe obstruction on 6/19 PFTs, prior smoker.  He is on home oxygen. Followed by pulmonology   4. OSA: CPAP QHS.    Length of Stay: [address] [place]  08/19/2021, 8:47 AM  Advanced Heart Failure Team Pager [phone] (M-F; 7a - 5p)  Please contact [facility] for night-coverage after hours (5p -7a ) and weekends on [email or web address]   Patient seen with PA, agree with the above note.   Feeling better overall this afternoon.  He remains on ceftriaxone and ampicillin for E faecalis bacteremia.  Good diuresis with IV Lasix.  He remains on 8 L oxygen by [place], at home he is on 4 L.  He is in NSR.   General: NAD Neck: JVP 9-10 cm, no thyromegaly or thyroid nodule.  Lungs: Clear to auscultation bilaterally with normal respiratory effort. CV: Nondisplaced PMI.  Heart regular S1/S2, no S3/S4,  3/6 early SEM RUSB.  1+ ankle edema.  Abdomen: Soft, nontender, no hepatosplenomegaly, no distention.  Skin: Intact without lesions or rashes.  Neurologic: Alert and oriented x 3.  Psych: Normal affect. Extremities: No clubbing or cyanosis.  HEENT: Normal.   Patient is volume overloaded in setting of fluid resuscitation for Enterococcal sepsis.  Has diastolic CHF.  Echo this admission similar to prior with EF 55-60%, moderate RV enlargement, low normal function, IVC dilated, bioprosthetic aortic valve not well-visualized.  - Continue Lasix 40 mg IV bid, good response so far.   E faecalis bacteremia, source uncertain but likely GI tract.  CT abdomen without did not show acute changes.   - Continue ceftriaxone/ampicillin per ID.  - Needs TEE to assess for endocarditis of bioprosthetic aortic valve.  If oxygen requirement is improving tomorrow, will aim for TEE tomorrow.  Discussed risks/benefits with patient and he agrees to procedure.    He is in NSR today, remains on heparin gtt as well as home doses of flecainide and metoprolol.   [name] 08/19/2021 3:31 PM

## 2021-08-19 NOTE — Progress Notes (Signed)
Report given to Samule Ohm RN at this time. Pt belongings gathered, pt transported via bed @1606 . No other needs identified at this time.

## 2021-08-19 NOTE — Progress Notes (Signed)
Sean Riley arrived to [phone] at 71. On 8L of high flow oxygen via [place]. No complaints of pain. Assessed skin with RN Judson Roch. Connected patient to telemetry. Oriented patient to bed controls and call light. Bed in lowest position, call light within reach.

## 2021-08-19 NOTE — Progress Notes (Signed)
[place] for heparin Indication: atrial fibrillation  Allergies  Allergen Reactions   Simvastatin Other (See Comments)    Mental status changes   Celecoxib Other (See Comments)    GI UPSET AND INFLAMMATION   Codeine Other (See Comments)    GI UPSET AND INFLAMMATION   Nsaids Other (See Comments)    GI UPSET AND INFLAMMATION (can tolerate via IV)   Tape Other (See Comments)    Medical tape and Band-Aids PULL OFF THE SKIN; please use Coban wrap   Cephalexin Itching and Rash   Latex Rash    Patient Measurements: Height: 6\' 1"  (185.4 cm) Weight: 88.7 kg (195 lb 8.8 oz) IBW/kg (Calculated) : 79.9 Heparin Dosing Weight: TBW   Vital Signs: Temp: 97.6 F (36.4 C) (01/10 0734) Temp Source: Oral (01/10 0734) BP: 134/65 (01/10 0800) Pulse Rate: 99 (01/10 0800)  Labs: Recent Labs    08/17/21 0246 08/17/21 1031 08/17/21 1325 08/18/21 0029 08/18/21 2018 08/19/21 0246  HGB 11.0*  --   --  10.9*  --  10.8*  HCT 34.0*  --   --  33.8*  --  34.6*  PLT 180  --   --  180  --  219  APTT 75*  --  74* 73*  --   --   HEPARINUNFRC 0.69  --  0.62 0.48 0.41 0.38  CREATININE  --  1.19  --  1.05  --  1.19     Estimated Creatinine Clearance: 64.3 mL/min (by C-G formula based on SCr of 1.19 mg/dL).   Medical History: 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 LUTS    Hypertension    Microscopic hematuria    negative cystoscopy   NONSPECIFIC ABNORMAL ELECTROCARDIOGRAM    Pleural effusion on right    Pleural effusion, bilateral    S/P aortic dissection repair 03/24/2017   Biological Bentall aortic root replacement + resection and grafting of entire ascending aorta, transverse  aortic arch and proximal descending thoracic aorta with elephant trunk distal anastomosis and debranching of aortic arch vessels   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 54 g 3 08/14/2021   ALPRAZolam (XANAX) 1 MG tablet TAKE 1 TABLET BY MOUTH AT BEDTIME. (Patient taking differently: Take 1 mg by mouth at bedtime.) 30 tablet 3 08/14/2021   bisacodyl (DULCOLAX) 5 MG EC tablet Take 5 mg by mouth daily as needed for moderate constipation.   unk   Budeson-Glycopyrrol-Formoterol (BREZTRI AEROSPHERE) 160-9-4.8 MCG/ACT AERO Inhale 2 puffs into the lungs in the morning and at bedtime. 32.1 g 3 08/14/2021   escitalopram (LEXAPRO) 10 MG tablet TAKE 1 TABLET DAILY (Patient taking differently: Take 10 mg by mouth daily.)  90 tablet 3 08/14/2021   esomeprazole (NEXIUM) 40 MG capsule Take 1 capsule (40 mg total) by mouth daily before breakfast. 90 capsule 3 08/14/2021   flecainide (TAMBOCOR) 50 MG tablet TAKE ONE AND ONE-HALF TABLETS TWICE A DAY (Patient taking differently: Take 75 mg by mouth 2 (two) times daily.) 270 tablet 2 08/14/2021   fluticasone (FLONASE) 50 MCG/ACT nasal spray Place 1 spray into both nostrils 2 (two) times daily as needed for allergies. Use one spray in each nostril twice daily.   Past Month   hydroxypropyl methylcellulose / hypromellose (ISOPTO TEARS / GONIOVISC) 2.5 % ophthalmic solution Place 1-2 drops into both eyes 3 (three) times daily as needed (dry/irritated eyes).   unk   ipratropium-albuterol (DUONEB) 0.5-2.5 (3) MG/3ML SOLN Take 3 mLs by  nebulization every 4 (four) hours as needed. (Patient taking differently: Take 3 mLs by nebulization every 4 (four) hours as needed (shortness of breath).) 360 mL 11 Past Month   JARDIANCE 10 MG TABS tablet TAKE 1 TABLET DAILY BEFORE BREAKFAST (Patient taking differently: Take 10 mg by mouth daily.) 90 tablet 3 08/14/2021   magnesium hydroxide (MILK OF MAGNESIA) 400 MG/5ML suspension Take 7.5 mLs by mouth daily as needed (stomach cramps/constipation).   unk   metoprolol tartrate (LOPRESSOR) 25 MG tablet Take 0.5 tablets (12.5 mg total) by mouth 2 (two) times daily. Please make yearly appt with Dr. Caryl Comes for January 2022 for future refills. Thank you 1st attempt 180 tablet 1 08/14/2021 at 2300   OXYGEN Inhale 3-4 L/min into the lungs continuous.      potassium chloride SA (KLOR-CON M) 20 MEQ tablet Take 40 mEq by mouth at bedtime.   08/14/2021   rosuvastatin (CRESTOR) 5 MG tablet TAKE 1 TABLET DAILY (Patient taking differently: Take 5 mg by mouth at bedtime.) 90 tablet 3 08/14/2021   torsemide (DEMADEX) 20 MG tablet Take 4 tablets (80 mg total) by mouth daily. (Patient taking differently: Take 60 mg by mouth daily.) 360 tablet 3 08/14/2021   potassium chloride SA (KLOR-CON M20) 20 MEQ tablet Take 2 tablets (40 mEq total) by mouth every morning AND 1 tablet (20 mEq total) every evening. (Patient not taking: Reported on 08/15/2021) 270 tablet 3 Not Taking   triamcinolone cream (KENALOG) 0.5 % Apply 1 application topically 3 (three) times daily. (Patient not taking: Reported on 08/15/2021)   Not Taking    Assessment: 31 YOM with h/o AFib on Eliquis at home to start IV heparin. Of note, patient reports not taking his Eliquis at home; however, baseline heparin level is elevated >1.1 as expected.  Heparin level: 0.38 (prior dose: heparin 1400 units/hr) Scr remains stable.  No signs/symptoms of bleeding.  Goal of Therapy:  Heparin level 0.3-0.7 units/ml aPTT 66-102 seconds Monitor platelets by anticoagulation  protocol: Yes   Plan:  Continue rate of heparin 1400 units/hr - communicated plan with RN Monitor daily heparin level, CBC, s/sx bleeding.  F/u anticoagulation plan and resuming PTA apixaban as appropriate   Thank you for allowing pharmacy to be apart of this patient's care  Asher Muir, PharmD Candidate  Please check AMION for all [place] numbers After 10:00 PM, call main pharmacy [phone] 08/19/2021 9:09 AM

## 2021-08-19 NOTE — Assessment & Plan Note (Signed)
See under severe sepsis.

## 2021-08-19 NOTE — Progress Notes (Signed)
[facility] Infectious Diseases Follow Up Note  Patient Identification: Patient Name: Sean Riley MRN: [id number] [place] Date: 08/15/2021  4:22 PM Age: 71 y.o.Today's Date: 08/19/2021   Reason for Visit: Enterococcal bacteremia   Principal Problem:   Enterococcal bacteremia Active Problems:   Atrial fibrillation (HCC)   COPD (chronic obstructive pulmonary disease) (HCC)   Normocytic anemia   Acute on chronic respiratory failure with hypoxia (HCC)   Chronic diastolic CHF (congestive heart failure) (HCC)   Severe sepsis with septic shock (HCC)   AKI (acute kidney injury) ([place])   Pressure injury of skin   Acute metabolic encephalopathy   Prosthetic valve endocarditis (HCC)   Antibiotics: Ampicillin 1/7-current                    Cefepime 1/6                    Ceftriaxone 1/8-current  Lines/Hardwares: PIV, external urethral catheter  Interval Events: afebrile, no leukocytosis    Assessment High-grade E faecalis 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  Acute respiratory failure in the setting of COPD and CHF/Volume overload  - on diuretics; Cardiology following  4.  Acute encephalopathy-has resolved  Recommendations Continue IV ampicillin and IVV ceftriaxone for presumptive aortic valve endocarditis TEE when able, cardiology is following.  Will need 6 weeks of IV ampicillin and ceftriaxone in case TEE cannot be performed Monitor CBC and CMP  Rest of the management as per the primary team. Thank you for the consult. Please page with pertinent questions or concerns.  ______________________________________________________________________ Subjective patient seen and examined at the bedside.   Wife is at bedside Still on 8 L of oxygen via nasal cannula  Vitals BP 134/65 (BP Location: Left Arm)    Pulse 99    Temp 97.6 F (36.4 C) (Oral)    Resp (!) 25    Ht 6\' 1"  (1.854 m)    Wt 88.7 kg    SpO2 97%    BMI 25.80 kg/m     Physical Exam Constitutional: Sitting up in the bed    Comments:   Cardiovascular:     Rate and Rhythm: Normal rate and regular rhythm.     Heart sounds: Irregular rhythm and systolic murmur  Pulmonary:     Effort: On 8 L of nasal cannula    Comments: Basal crackles present  Abdominal:     Palpations: Abdomen is soft.     Tenderness: Nontender and nondistended  Musculoskeletal:        General: No swelling or tenderness.   Skin:    Comments: No lesions or rashes  Neurological:     General: No focal deficit present.  Awake alert and oriented.  Follows commands  Psychiatric:        Mood and Affect: Mood normal.  Calm and cooperative    Pertinent Microbiology Results for orders placed or performed during the hospital encounter of 08/15/21  Culture, blood (Routine x 2)     Status: Abnormal   Collection Time: 08/15/21  4:50 PM   Specimen: BLOOD  Result Value Ref Range Status   Specimen Description BLOOD SITE NOT SPECIFIED  Final   Special Requests   Final    BOTTLES DRAWN AEROBIC AND ANAEROBIC Blood Culture  results may not be optimal due to an excessive volume of blood received in culture bottles   Culture  Setup Time   Final    GRAM POSITIVE COCCI IN BOTH AEROBIC AND ANAEROBIC BOTTLES CRITICAL VALUE NOTED.  VALUE IS CONSISTENT WITH PREVIOUSLY REPORTED AND CALLED VALUE.    Culture (A)  Final    ENTEROCOCCUS FAECALIS SUSCEPTIBILITIES PERFORMED ON PREVIOUS CULTURE WITHIN THE LAST 5 DAYS. Performed at [facility], [place] [address] [place], [place] [address]    Report Status 08/18/2021 FINAL  Final  Resp Panel by RT-PCR (Flu A&B, Covid) Peripheral     Status: None   Collection Time: 08/15/21  4:51 PM   Specimen: Peripheral;  Nasopharyngeal(NP) swabs in vial transport medium  Result Value Ref Range Status   SARS Coronavirus 2 by RT PCR NEGATIVE NEGATIVE Final    Comment: (NOTE) SARS-CoV-2 target nucleic acids are NOT DETECTED.  The SARS-CoV-2 RNA is generally detectable in upper respiratory specimens during the acute phase of infection. The lowest concentration of SARS-CoV-2 viral copies this assay can detect is 138 copies/mL. A negative result does not preclude SARS-Cov-2 infection and should not be used as the sole basis for treatment or other patient management decisions. A negative result may occur with  improper specimen collection/handling, submission of specimen other than nasopharyngeal swab, presence of viral mutation(s) within the areas targeted by this assay, and inadequate number of viral copies(<138 copies/mL). A negative result must be combined with clinical observations, patient history, and epidemiological information. The expected result is Negative.  Fact Sheet for Patients:  EntrepreneurPulse.com.au  Fact Sheet for Healthcare Providers:  IncredibleEmployment.be  This test is no t yet approved or cleared by the [place] FDA and  has been authorized for detection and/or diagnosis of SARS-CoV-2 by FDA under an Emergency Use Authorization (EUA). This EUA will remain  in effect (meaning this test can be used) for the duration of the COVID-19 declaration under Section 564(b)(1) of the Act, 21 U.S.C.section 360bbb-3(b)(1), unless the authorization is terminated  or revoked sooner.       Influenza A by PCR NEGATIVE NEGATIVE Final   Influenza B by PCR NEGATIVE NEGATIVE Final    Comment: (NOTE) The Xpert Xpress SARS-CoV-2/FLU/RSV plus assay is intended as an aid in the diagnosis of influenza from Nasopharyngeal swab specimens and should not be used as a sole basis for treatment. Nasal washings and aspirates are unacceptable for Xpert Xpress  SARS-CoV-2/FLU/RSV testing.  Fact Sheet for Patients: EntrepreneurPulse.com.au  Fact Sheet for Healthcare Providers: IncredibleEmployment.be  This test is not yet approved or cleared by the [place] FDA and has been authorized for detection and/or diagnosis of SARS-CoV-2 by FDA under an Emergency Use Authorization (EUA). This EUA will remain in effect (meaning this test can be used) for the duration of the COVID-19 declaration under Section 564(b)(1) of the Act, 21 U.S.C. section 360bbb-3(b)(1), unless the authorization is terminated or revoked.  Performed at [facility], [place] [address] [place], [place] [address]   Culture, blood (Routine x 2)     Status: Abnormal   Collection Time: 08/15/21  5:02 PM   Specimen: BLOOD  Result Value Ref Range Status   Specimen Description BLOOD SITE NOT SPECIFIED  Final   Special Requests   Final    BOTTLES DRAWN AEROBIC AND ANAEROBIC Blood Culture results may not be optimal due to an excessive volume of blood received in culture bottles   Culture  Setup Time  Final    GRAM POSITIVE COCCI IN CHAINS IN BOTH AEROBIC AND ANAEROBIC BOTTLES CRITICAL RESULT CALLED TO, READ BACK BY AND VERIFIED WITHJiles Garter [facility] PHARMD @0817  08/16/21 EB Performed at [facility], [place] [address], [place], [place] [address]    Culture ENTEROCOCCUS FAECALIS (A)  Final   Report Status 08/18/2021 FINAL  Final   Organism ID, Bacteria ENTEROCOCCUS FAECALIS  Final      Susceptibility   Enterococcus faecalis - MIC*    AMPICILLIN <=2 SENSITIVE Sensitive     VANCOMYCIN 1 SENSITIVE Sensitive     GENTAMICIN SYNERGY SENSITIVE Sensitive     * ENTEROCOCCUS FAECALIS  Blood Culture ID Panel (Reflexed)     Status: Abnormal   Collection Time: 08/15/21  5:02 PM  Result Value Ref Range Status   Enterococcus faecalis DETECTED (A) NOT DETECTED Final    Comment: CRITICAL RESULT CALLED TO, READ BACK BY AND VERIFIED WITH: Jiles Garter  DOHLEN PHARMD @0817  08/16/21 EB    Enterococcus Faecium NOT DETECTED NOT DETECTED Final   Listeria monocytogenes NOT DETECTED NOT DETECTED Final   Staphylococcus species NOT DETECTED NOT DETECTED Final   Staphylococcus aureus (BCID) NOT DETECTED NOT DETECTED Final   Staphylococcus epidermidis NOT DETECTED NOT DETECTED Final   Staphylococcus lugdunensis NOT DETECTED NOT DETECTED Final   Streptococcus species NOT DETECTED NOT DETECTED Final   Streptococcus agalactiae NOT DETECTED NOT DETECTED Final   Streptococcus pneumoniae NOT DETECTED NOT DETECTED Final   Streptococcus pyogenes NOT DETECTED NOT DETECTED Final   A.calcoaceticus-baumannii NOT DETECTED NOT DETECTED Final   Bacteroides fragilis NOT DETECTED NOT DETECTED Final   Enterobacterales NOT DETECTED NOT DETECTED Final   Enterobacter cloacae complex NOT DETECTED NOT DETECTED Final   Escherichia coli NOT DETECTED NOT DETECTED Final   Klebsiella aerogenes NOT DETECTED NOT DETECTED Final   Klebsiella oxytoca NOT DETECTED NOT DETECTED Final   Klebsiella pneumoniae NOT DETECTED NOT DETECTED Final   Proteus species NOT DETECTED NOT DETECTED Final   Salmonella species NOT DETECTED NOT DETECTED Final   Serratia marcescens NOT DETECTED NOT DETECTED Final   Haemophilus influenzae NOT DETECTED NOT DETECTED Final   Neisseria meningitidis NOT DETECTED NOT DETECTED Final   Pseudomonas aeruginosa NOT DETECTED NOT DETECTED Final   Stenotrophomonas maltophilia NOT DETECTED NOT DETECTED Final   Candida albicans NOT DETECTED NOT DETECTED Final   Candida auris NOT DETECTED NOT DETECTED Final   Candida glabrata NOT DETECTED NOT DETECTED Final   Candida krusei NOT DETECTED NOT DETECTED Final   Candida parapsilosis NOT DETECTED NOT DETECTED Final   Candida tropicalis NOT DETECTED NOT DETECTED Final   Cryptococcus neoformans/gattii NOT DETECTED NOT DETECTED Final   Vancomycin resistance NOT DETECTED NOT DETECTED Final    Comment: Performed at [facility], [place] [address], [place], [place] [address]  Urine Culture     Status: None   Collection Time: 08/15/21  6:57 PM   Specimen: In/Out Cath Urine  Result Value Ref Range Status   Specimen Description IN/OUT CATH URINE  Final   Special Requests NONE  Final   Culture   Final    NO GROWTH Performed at [facility], [place] [address], [place], [place] [address]    Report Status 08/17/2021 FINAL  Final  MRSA Next Gen by PCR, Nasal     Status: None   Collection Time: 08/15/21 11:30 PM   Specimen: Nasal Mucosa; Nasal Swab  Result Value Ref Range Status   MRSA  by PCR Next Gen NOT DETECTED NOT DETECTED Final    Comment: (NOTE) The GeneXpert MRSA Assay (FDA approved for NASAL specimens only), is one component of a comprehensive MRSA colonization surveillance program. It is not intended to diagnose MRSA infection nor to guide or monitor treatment for MRSA infections. Test performance is not FDA approved in patients less than 83 years old. Performed at [facility], [place] [address] [place], [place] [address]   Culture, blood (Routine X 2) w Reflex to ID Panel     Status: None (Preliminary result)   Collection Time: 08/16/21  2:57 PM   Specimen: BLOOD  Result Value Ref Range Status   Specimen Description BLOOD SITE NOT SPECIFIED  Final   Special Requests   Final    AEROBIC BOTTLE ONLY Blood Culture results may not be optimal due to an inadequate volume of blood received in culture bottles   Culture   Final    NO GROWTH 3 DAYS Performed at [facility], [place] [address], [place], [place] [address]    Report Status PENDING  Incomplete  Culture, blood (Routine X 2) w Reflex to ID Panel     Status: None (Preliminary result)   Collection Time: 08/16/21  2:58 PM   Specimen: BLOOD  Result Value Ref Range Status   Specimen Description BLOOD SITE NOT SPECIFIED  Final   Special Requests   Final    AEROBIC BOTTLE ONLY Blood Culture results may not be optimal due to an inadequate  volume of blood received in culture bottles   Culture   Final    NO GROWTH 3 DAYS Performed at [facility], [place] [address] [place], [place] [address]    Report Status PENDING  Incomplete    Pertinent Lab. CBC Latest Ref Rng & Units 08/19/2021 08/18/2021 08/17/2021  WBC 4.0 - 10.5 K/uL 8.5 8.2 9.1  Hemoglobin 13.0 - 17.0 g/dL 10.8(L) 10.9(L) 11.0(L)  Hematocrit 39.0 - 52.0 % 34.6(L) 33.8(L) 34.0(L)  Platelets 150 - 400 K/uL 219 180 180   CMP Latest Ref Rng & Units 08/19/2021 08/18/2021 08/17/2021  Glucose 70 - 99 mg/dL 99 105(H) 132(H)  BUN 8 - 23 mg/dL 12 16 24(H)  Creatinine 0.61 - 1.24 mg/dL 1.19 1.05 1.19  Sodium 135 - 145 mmol/L 135 136 134(L)  Potassium 3.5 - 5.1 mmol/L 3.9 3.9 3.4(L)  Chloride 98 - 111 mmol/L 101 107 100  CO2 22 - 32 mmol/L 27 25 25   Calcium 8.9 - 10.3 mg/dL 7.9(L) 7.7(L) 8.2(L)  Total Protein 6.5 - 8.1 g/dL - - -  Total Bilirubin 0.3 - 1.2 mg/dL - - -  Alkaline Phos 38 - 126 U/L - - -  AST 15 - 41 U/L - - -  ALT 0 - 44 U/L - - -     Pertinent Imaging today Plain films and CT images have been personally visualized and interpreted; radiology reports have been reviewed. Decision making incorporated into the Impression / Recommendations. DG Chest 1 View  Result Date: 08/17/2021 CLINICAL DATA:  Edema EXAM: CHEST  1 VIEW COMPARISON:  Chest x-ray 08/15/2021, CT 08/16/2021, radiograph 04/15/2021 FINDINGS: Post sternotomy changes with aortic stent graft. Emphysema and chronic interstitial disease. Left-sided pleural effusion, similar right pleural effusion, effusions appears slightly loculated. Peripheral parenchymal opacities are also unchanged and could be due to chronic scarring. Cardiomegaly with vascular congestion. IMPRESSION: 1. Chronic bilateral effusions with slight increased pleural effusion on the left compared to prior. 2. Cardiomegaly  with slight central congestion 3. Emphysema with chronic interstitial disease. Electronically Signed   By: Donavan Foil  M.D.   On: 08/17/2021 16:56   DG Chest 2 View  Result Date: 08/15/2021 CLINICAL DATA:  Fever, malaise for 2 days, cough EXAM: CHEST - 2 VIEW COMPARISON:  Chest radiograph 04/15/2021 FINDINGS: Image quality is degraded by patient positioning, with the head overlying portions of the upper lobes. The cardiomediastinal silhouette is grossly stable, with unchanged cardiomegaly. 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 There are bilateral pleural effusions, overall similar to prior studies. There are patchy opacities in the lung bases, improved on the right but worsened on the left since 04/15/2021. There is no definite pneumothorax. There is no acute osseous abnormality. IMPRESSION: Overall unchanged bilateral pleural effusions since 04/15/2021, but with worsened aeration in the left base. Findings could reflect left lower lobe pneumonia superimposed on chronic changes in the correct clinical setting. Electronically Signed   By: Valetta Mole M.D.   On: 08/15/2021 17:42   CT HEAD WO CONTRAST (5MM)  Result Date: 08/16/2021 CLINICAL DATA:  Altered mental status, nontraumatic (Ped 0-17y). Sepsis. EXAM: CT HEAD WITHOUT CONTRAST TECHNIQUE: Contiguous axial images were obtained from the base of the skull through the vertex without intravenous contrast. COMPARISON:  None. FINDINGS: Brain: There is atrophy and chronic small vessel disease changes. No acute intracranial abnormality. Specifically, no hemorrhage, hydrocephalus, mass lesion, acute infarction, or significant intracranial injury. Vascular: No hyperdense vessel or unexpected calcification. Skull: No acute calvarial abnormality. Sinuses/Orbits: No acute findings Other: None IMPRESSION: Atrophy, chronic microvascular disease. No acute intracranial abnormality. Electronically Signed   By: Rolm Baptise M.D.   On: 08/16/2021 01:12   CT CHEST WO CONTRAST  Result Date: 08/16/2021 CLINICAL DATA:  Sepsis, respiratory  distress EXAM: CT CHEST WITHOUT CONTRAST TECHNIQUE: Multidetector CT imaging of the chest was performed following the standard protocol without IV contrast. COMPARISON:  01/15/2021 FINDINGS: Cardiovascular: Postoperative changes from aortic root and arch repair. Stable descending thoracic aortic aneurysm, 5.1 cm. Heart is normal size. Coronary artery calcifications. Mediastinum/Nodes: Mildly prominent borderline sized mediastinal lymph nodes. Right paratracheal lymph node has a short axis diameter of 13 mm, stable. No axillary or visible hilar adenopathy. Lungs/Pleura: Small bilateral pleural effusions, similar to prior study. Peripheral airspace opacities in both lower lobes as well as right middle lobe and lingula, favor rounded atelectasis. Overall no change since prior study. Moderate centrilobular and paraseptal emphysema. Upper Abdomen: No acute findings Musculoskeletal: Chest wall soft tissues are unremarkable. No acute bony abnormality. IMPRESSION: Stable small bilateral pleural effusions with peripheral somewhat rounded airspace opacities in both mid and lower lungs, favor rounded atelectasis and/or scarring. Postoperative changes from aortic repair related to aortic dissection. 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 Coronary artery disease. No definite acute process. Aortic Atherosclerosis (ICD10-I70.0) and Emphysema (ICD10-J43.9). Electronically Signed   By: Rolm Baptise M.D.   On: 08/16/2021 01:16   CT ABDOMEN PELVIS W CONTRAST  Result Date: 08/17/2021 CLINICAL DATA:  Sepsis EXAM: CT ABDOMEN AND PELVIS WITH CONTRAST TECHNIQUE: Multidetector CT imaging of the abdomen and pelvis was performed using the standard protocol following bolus administration of intravenous contrast. CONTRAST:  124mL OMNIPAQUE IOHEXOL 300 MG/ML  SOLN COMPARISON:  01/29/2021 FINDINGS: Lower chest: Cardiomegaly. Unchanged small, loculated bilateral pleural effusions associated rounded atelectasis. Hepatobiliary: No  solid liver abnormality is seen. Gallstones and sludge contracted in the gallbladder. Gallbladder wall thickening, or biliary dilatation. Pancreas: Unremarkable. No pancreatic ductal  dilatation or surrounding inflammatory changes. Spleen: Normal in size without significant abnormality. Adrenals/Urinary Tract: Adrenal glands are unremarkable. Kidneys are normal, without renal calculi, solid lesion, or hydronephrosis. Foley catheter in the urinary bladder. Thickening of the urinary bladder wall, likely related to chronic outlet obstruction. Stomach/Bowel: Stomach is within normal limits. Appendix is not clearly visualized. No evidence of bowel wall thickening, distention, or inflammatory changes. Descending and sigmoid diverticulosis. Vascular/Lymphatic: Aortic atherosclerosis. 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 The abdominal aorta measures up to 4.7 x 4.0 cm in maximum caliber near the diaphragmatic hiatus. No enlarged abdominal or pelvic lymph nodes. Reproductive: Prostatomegaly. Other: Anasarca.  No abdominopelvic ascites. Musculoskeletal: No acute or significant osseous findings. IMPRESSION: 1. No acute CT findings of the abdomen or pelvis to explain sepsis. 2. Gallstones and sludge contracted in the gallbladder. No CT evidence of acute cholecystitis. 3. Descending and sigmoid diverticulosis without evidence of acute diverticulitis. 4. 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 The abdominal aorta measures up to 4.7 x 4.0 cm in maximum caliber near the diaphragmatic hiatus. Aortic Atherosclerosis (ICD10-I70.0). Electronically Signed   By: Delanna Ahmadi M.D.   On: 08/17/2021 16:25   ECHOCARDIOGRAM COMPLETE  Result Date: 08/16/2021    ECHOCARDIOGRAM REPORT    Patient Name:   CHESLEY VEASEY Date of Exam: 08/16/2021 Medical Rec #:  [id number]        Height:       73.0 in Accession #:    [id number]       Weight:       191.4 lb Date of Birth:  1950-10-13        BSA:          2.112 m Patient Age:    64 years         BP:           105/49 mmHg Patient Gender: M                HR:           64 bpm. Exam Location:  Inpatient Procedure: 2D Echo, Cardiac Doppler, Color Doppler and Intracardiac            Opacification Agent Indications:    Atrial fibrillation  History:        Patient has prior history of Echocardiogram examinations, most                 recent 09/06/2020. CHF, COPD, Aortic Valve Disease; Risk                 Factors:Hypertension and Dyslipidemia. 25 mm Bovine valve in                 aortic position. Procedure date 03/23/17. GERD. Hx ascending                 aortic dissection, s/p repair.  Sonographer:    Clayton Lefort RDCS (AE) Referring Phys: [id number] Estill Cotta  Sonographer Comments: Technically difficult study due to poor echo windows. Image acquisition challenging due to COPD. IMPRESSIONS  1. Basal septal hypokinesis . Left ventricular ejection fraction, by estimation, is 55 to 60%. The left ventricle has normal function. The left ventricular internal cavity size was mildly dilated. There is mild left ventricular hypertrophy.  2. Right ventricular systolic  function is low normal. The right ventricular size is moderately enlarged. There is moderately elevated pulmonary artery systolic pressure.  3. Right atrial size was severely dilated.  4. Mild mitral valve regurgitation.  5. Tricuspid valve regurgitation is moderate.  6. Poor acoustic windows limit study. 25 mm Edwards bovine valve present (03/23/2017) Difficult to see well. Peak and mean gradients through the valve are 5 and 2 mm Hg respectively Previous echo in May 2022, peak and mean gradients were 34 and 18 mm Hg respectively. Poor windows from current study, may not have attained optimal angle to measure  gradients. Follow over time. . The aortic valve has been repaired/replaced. Aortic valve regurgitation is not visualized. Aortic valve sclerosis/calcification is present, without any evidence of aortic stenosis.  7. The inferior vena cava is dilated in size with <50% respiratory variability, suggesting right atrial pressure of 15 mmHg. FINDINGS  Left Ventricle: Basal septal hypokinesis. Left ventricular ejection fraction, by estimation, is 55 to 60%. The left ventricle has normal function. Definity contrast agent was given IV to delineate the left ventricular endocardial borders. The left ventricular internal cavity size was mildly dilated. There is mild left ventricular hypertrophy. Right Ventricle: The right ventricular size is moderately enlarged. Right vetricular wall thickness was not assessed. Right ventricular systolic function is low normal. There is moderately elevated pulmonary artery systolic pressure. The tricuspid regurgitant velocity is 3.18 m/s, and with an assumed right atrial pressure of 15 mmHg, the estimated right ventricular systolic pressure is 11.9 mmHg. Left Atrium: Left atrial size was normal in size. Right Atrium: Right atrial size was severely dilated. Pericardium: There is no evidence of pericardial effusion. Mitral Valve: There is mild thickening of the mitral valve leaflet(s). Mild mitral annular calcification. Mild mitral valve regurgitation. Tricuspid Valve: The tricuspid valve is grossly normal. Tricuspid valve regurgitation is moderate. Aortic Valve: Poor acoustic windows limit study. 25 mm Edwards bovine valve present (03/23/2017) Difficult to see well. Peak and mean gradients through the valve are 5 and 2 mm Hg respectively Previous echo in May 2022, peak and mean gradients were 34 and  18 mm Hg respectively. Poor windows from current study, may not have attained optimal angle to measure gradients. Follow over time. The aortic valve has been repaired/replaced. Aortic valve  regurgitation is not visualized. Aortic valve sclerosis/calcification is present, without any evidence of aortic stenosis. Aortic valve mean gradient measures 2.0 mmHg. Aortic valve peak gradient measures 4.6 mmHg. Aortic valve area, by VTI measures 5.04 cm. Pulmonic Valve: The pulmonic valve was not well visualized. Aorta: The aortic root and ascending aorta are structurally normal, with no evidence of dilitation. Venous: The inferior vena cava is dilated in size with less than 50% respiratory variability, suggesting right atrial pressure of 15 mmHg. IAS/Shunts: No atrial level shunt detected by color flow Doppler.  LEFT VENTRICLE PLAX 2D LVIDd:         5.40 cm LVIDs:         4.10 cm LV PW:         1.20 cm LV IVS:        1.40 cm LVOT diam:     2.50 cm LV SV:         76 LV SV Index:   36 LVOT Area:     4.91 cm  IVC IVC diam: 2.60 cm LEFT ATRIUM           Index        RIGHT ATRIUM  Index LA diam:      3.10 cm 1.47 cm/m   RA Area:     32.50 cm LA Vol (A4C): 64.7 ml 30.64 ml/m  RA Volume:   115.00 ml 54.46 ml/m  AORTIC VALVE AV Area (Vmax):    3.43 cm AV Area (Vmean):   3.28 cm AV Area (VTI):     5.04 cm AV Vmax:           107.00 cm/s AV Vmean:          71.900 cm/s AV VTI:            0.151 m AV Peak Grad:      4.6 mmHg AV Mean Grad:      2.0 mmHg LVOT Vmax:         74.80 cm/s LVOT Vmean:        48.100 cm/s LVOT VTI:          0.155 m LVOT/AV VTI ratio: 1.03  AORTA Ao Root diam: 3.10 cm Ao Asc diam:  3.60 cm TRICUSPID VALVE TR Peak grad:   40.4 mmHg TR Vmax:        318.00 cm/s  SHUNTS Systemic VTI:  0.16 m Systemic Diam: 2.50 cm Dorris Carnes MD Electronically signed by Dorris Carnes MD Signature Date/Time: 08/16/2021/12:56:09 PM    Final      I spent approx 45 minutes for this patient encounter including review of prior medical records, coordination of care with primary/other specialist with greater than 50% of time being face to face/counseling and discussing diagnostics/treatment plan with the  patient/family.  Electronically signed by:   Rosiland Oz, MD Infectious Disease Physician [facility] for Infectious Disease Pager: [phone]

## 2021-08-19 NOTE — TOC Initial Note (Addendum)
Transition of Care [facility]) - Initial/Assessment Note    Patient Details  Name: Sean Riley MRN: [id number] Date of Birth: 07-07-1951  [facility] of [facility]) CM/SW Contact:    Erenest Rasher, RN Phone Number: [phone] 08/19/2021, 3:19 PM  Clinical Narrative:                  HF TOC CM spoke to pt and wife at bedside. Offered choice for [facility], pt and wife states he had [facility] ([facility]) in the past. Contacted [facility] rep, Production manager with new referral. Notified [employer] rep, Pam RN with possible dc home with home IV abx. Will need HHRN and PT orders with [facility]. Will obtain rx for home IV abx closer to dc. Pt has DME at home. Will notify [employer] if his oxygen concentrator at home needs to be switched to a higher flow oxygen concentrator, current concentrator only goes to 5L.      Expected Discharge Plan: [place] Barriers to Discharge: Continued Medical Work up   Patient Goals and CMS Choice Patient states their goals for this hospitalization and ongoing recovery are:: would like to return to baseline functioning CMS Medicare.gov Compare Post Acute Care list provided to:: Patient Choice offered to / list presented to : Patient, Spouse  Expected Discharge Plan and Services Expected Discharge Plan: [place]   Discharge Planning Services: CM Consult Post Acute Care Choice: [place] arrangements for the past 2 months: Single Family Home                           HH Arranged: RN, PT [facility] Agency: [place] ([facility]) Date HH Agency Contacted: 08/19/21 Time [place]: 1518 Representative spoke with at [place]: Wylene Men  Prior Living Arrangements/Services Living arrangements for the past 2 months: [place] with:: Spouse Patient language and need for interpreter reviewed:: Yes Do you feel safe going back to the place where you live?: Yes      Need for Family  Participation in Patient Care: Yes (Comment) Care giver support system in place?: Yes (comment) Current home services: DME (CPAP, oxygen ([employer]), rolling walker, rollator, bedside commode, cane) Criminal Activity/Legal Involvement Pertinent to Current Situation/Hospitalization: No - Comment as needed  Activities of Daily Living      Permission Sought/Granted Permission sought to share information with : Case Manager, Family Supports, PCP Permission granted to share information with : Yes, Verbal Permission Granted  Share Information with NAME: [place] granted to share info w AGENCY: [place], DME  Permission granted to share info w Relationship: wife  Permission granted to share info w Contact Information: [phone]  Emotional Assessment Appearance:: Appears stated age Attitude/Demeanor/Rapport: Gracious, Engaged Affect (typically observed): Accepting Orientation: : Oriented to Self, Oriented to Place, Oriented to  Time, Oriented to Situation   Psych Involvement: No (comment)  Admission diagnosis:  Sepsis ([place]) [A41.9] Sepsis, due to unspecified organism, unspecified whether acute organ dysfunction present [facility]) [A41.9] Patient Active Problem List   Diagnosis Date Noted   Pressure injury of skin 11/94/1740   Acute metabolic encephalopathy 81/44/8185   Prosthetic valve endocarditis ([place])    Enterococcal bacteremia 08/16/2021   Severe sepsis with septic shock ([place]) 08/15/2021   AKI (acute kidney injury) ([place])    Cellulitis of left ankle 06/23/2021   Heme + stool    UTI due to extended-spectrum beta  lactamase (ESBL) producing Escherichia coli 01/27/2021   Chronic diastolic CHF (congestive heart failure) ([place]) 01/27/2021   Acute GI bleeding 01/27/2021   Normocytic anemia 01/24/2021   Acute on chronic respiratory failure with hypoxia ([place]) 01/24/2021   GI bleed 01/24/2021   Chest pain 01/24/2021   Leukocytosis 01/24/2021   CKD (chronic kidney disease)  01/24/2021   Acute respiratory failure with hypoxia ([place]) 09/06/2020   Secondary hypercoagulable state ([place]) 06/26/2020   Hormonal disorder 10/16/2019   Insomnia 12/29/2018   Chronic anemia 12/29/2018   Atrial tachycardia ([place])    Thoracic aortic aneurysm without rupture 03/30/2018   COPD (chronic obstructive pulmonary disease) ([place]) 10/06/2017   Psoriasis 09/14/2017   Depression 04/18/2017   Pericardial effusion    S/P aortic dissection repair 03/24/2017   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   Elevated PSA 01/11/2016   PCP NOTES >>>>>>> 07/24/2015   Gynecomastia, male 10/10/2014   Allergic rhinitis 09/10/2014   Dermatitis 05/11/2014   Annual physical exam 05/10/2014   Erectile dysfunction 05/10/2014   HTN (hypertension) 10/28/2013   Bronchospasm 10/28/2013   ETOH abuse 10/28/2013   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 [facility] [place] [address] Phone: [phone] Fax: [phone]     Social Determinants of Health (SDOH) Interventions    Readmission Risk Interventions No flowsheet data found.

## 2021-08-19 NOTE — Progress Notes (Signed)
[facility] PROGRESS NOTE   Sean Riley:[id number] DOB: 1951/02/09 DOA: 08/15/2021  4 DOS: the patient was seen and examined on 08/19/2021  PCP: Colon Branch, MD  Brief History and Hospital Course:  Sean Riley, is a 71 y.o. male, who presented to the [facility] ED with a chief complaint of confusion, cough, and SOB.  He has a past medical history of COPD on home O2, afib on eliquis, CHF, ascending aortic dissection s/p repair, AVR on 03/24/2017, HTN, GIB, GERD.  Patient was noted to be hypoxic.  Started on BiPAP.  Also noted to be hypotensive.  Was hospitalized to the intensive care unit.  Developed Enterococcus bacteremia.  Seen by infectious disease.  Stabilized.  Transferred to the hospitalist service.  Cardiology was consulted as well. Noted to have volume overload and started on IV furosemide.  Remains on high flow nasal cannula at 8 L/min.  Consultants: Cardiology.  Critical care medicine.  Procedures: None    Subjective: Patient remains mildly distracted.  Denies any chest pain shortness of breath.  Does not appear to be in any discomfort.    Assessment/Plan:  * Enterococcal bacteremia- (present on admission) See under severe sepsis.  Severe sepsis with septic shock ([place]) Secondary to Enterococcus faecalis bacteremia.  Source is unclear.  Concern for infective endocarditis.  Infectious diseases following.  Patient is noted to be on ampicillin and ceftriaxone. Due to tenuous respiratory status TEE has not been done yet. WBC remains normal.  He remains afebrile.   Underwent CT scan of the abdomen pelvis which did not show any obvious source.  He was found to have cholelithiasis without cholecystitis.  Diverticulosis was noted without diverticulitis.  Abdomen is benign. Continue to monitor.  Acute on chronic respiratory failure with hypoxia ([place])- (present on admission) Patient uses oxygen at home at 3 to 4 L/min.  Has been on high amounts of oxygen here in the  hospital.  Currently on 8 L/min HFNC. Chest x-ray on 1/8 raise concern for emphysema and also concern for volume overload.  Found to have chronic pleural effusions Started on furosemide yesterday.  Diuresed well.  Cardiology is continuing twice a day furosemide at this time. Continue nebulizer treatment as needed.  He is noted to be on Anoro Ellipta.  No wheezing is appreciated on examination.    AKI (acute kidney injury) ([place])- (present on admission) Presented with creatinine of 1.94.  Baseline creatinine between 1.3-1.5.   Renal function has improved stable.  Monitor urine output.  Avoid nephrotoxic agents.  Acute metabolic encephalopathy- (present on admission) Mentation is stable.  No focal neurological deficits noted.  Reorient daily.  Mobilize.  COPD (chronic obstructive pulmonary disease) ([place])- (present on admission) Stable.  See above.  Nebulizer treatment.  Continue maintenance medications.  Atrial fibrillation ([place])- (present on admission) Known history of same.  Was on Eliquis prior to admission.  Currently on heparin infusion.  Noted to be on metoprolol and flecainide.  Previous history of aortic valve replacement as well.  Also has a history of thoracic aortic aneurysm repair in 2018. Cardiology is following.  Chronic diastolic CHF (congestive heart failure) ([place])- (present on admission) Acute on chronic diastolic CHF  Continue furosemide.  Cardiology is managing this.  Monitor electrolytes.  Normocytic anemia Hemoglobin stable.  No evidence of overt bleeding.      Pressure Injury 08/16/21 Buttocks Right;Left Stage 1 -  Intact skin with non-blanchable redness of a localized area usually over a bony prominence. Deep tissue  injury with erythema and maroon coloration (Active)  08/16/21 0000  Location: Buttocks  Location Orientation: Right;Left  Staging: Stage 1 -  Intact skin with non-blanchable redness of a localized area usually over a bony prominence.  Wound  Description (Comments): Deep tissue injury with erythema and maroon coloration  Present on Admission: Yes       DVT Prophylaxis: On IV heparin Code Status: Full code Family Communication: No family at bedside Disposition Plan:  PT and OT evaluation is pending  Status is: Inpatient  Remains inpatient appropriate because: Acute respiratory failure, bacteremia     Medications: Scheduled:  ALPRAZolam  1 mg Oral QHS   Chlorhexidine Gluconate Cloth  6 each Topical Daily   escitalopram  10 mg Oral Daily   flecainide  75 mg Oral BID   furosemide  40 mg Intravenous BID   insulin aspart  0-15 Units Subcutaneous Q4H   mouth rinse  15 mL Mouth Rinse BID   metoprolol tartrate  12.5 mg Oral BID   pantoprazole  40 mg Oral QHS   rosuvastatin  5 mg Oral Daily   triamcinolone cream   Topical BID   umeclidinium-vilanterol  1 puff Inhalation Daily   Continuous:  sodium chloride Stopped (08/16/21 0004)   ampicillin (OMNIPEN) IV 2 g (08/19/21 0749)   cefTRIAXone (ROCEPHIN)  IV 2 g (08/19/21 0538)   heparin 1,400 Units/hr (08/19/21 0518)   ATF:TDDUKGURK, bisacodyl, docusate sodium, fluticasone, magnesium hydroxide, ondansetron (ZOFRAN) IV, polyethylene glycol, polyvinyl alcohol  Antibiotics: Anti-infectives (From admission, onward)    Start     Dose/Rate Route Frequency Ordered Stop   08/17/21 1530  cefTRIAXone (ROCEPHIN) 2 g in sodium chloride 0.9 % 100 mL IVPB        2 g 200 mL/hr over 30 Minutes Intravenous Every 12 hours 08/17/21 1435     08/16/21 2000  vancomycin (VANCOREADY) IVPB 1250 mg/250 mL  Status:  Discontinued        1,250 mg 166.7 mL/hr over 90 Minutes Intravenous Every 24 hours 08/15/21 1957 08/16/21 0844   08/16/21 0930  ampicillin (OMNIPEN) 2 g in sodium chloride 0.9 % 100 mL IVPB        2 g 300 mL/hr over 20 Minutes Intravenous Every 4 hours 08/16/21 0844     08/16/21 0600  ceFEPIme (MAXIPIME) 2 g in sodium chloride 0.9 % 100 mL IVPB  Status:  Discontinued        2  g 200 mL/hr over 30 Minutes Intravenous Every 12 hours 08/15/21 1957 08/16/21 0844   08/15/21 1715  vancomycin (VANCOREADY) IVPB 1750 mg/350 mL        1,750 mg 175 mL/hr over 120 Minutes Intravenous  Once 08/15/21 1711 08/15/21 2102   08/15/21 1700  ceFEPIme (MAXIPIME) 2 g in sodium chloride 0.9 % 100 mL IVPB        2 g 200 mL/hr over 30 Minutes Intravenous  Once 08/15/21 1655 08/15/21 1803   08/15/21 1700  metroNIDAZOLE (FLAGYL) IVPB 500 mg        500 mg 100 mL/hr over 60 Minutes Intravenous  Once 08/15/21 1655 08/15/21 1833   08/15/21 1700  vancomycin (VANCOCIN) IVPB 1000 mg/200 mL premix  Status:  Discontinued        1,000 mg 200 mL/hr over 60 Minutes Intravenous  Once 08/15/21 1655 08/15/21 1711       Objective:  Vital Signs  Vitals:   08/19/21 0400 08/19/21 0500 08/19/21 0734 08/19/21 0800  BP: 107/63 (!) 147/67  134/65  Pulse: 63 83  99  Resp: (!) 22 (!) 22  (!) 25  Temp:   97.6 F (36.4 C)   TempSrc:   Oral   SpO2: 94% 91%  97%  Weight:  88.7 kg    Height:        Intake/Output Summary (Last 24 hours) at 08/19/2021 1008 Last data filed at 08/19/2021 0519 Gross per 24 hour  Intake 967.2 ml  Output 3875 ml  Net -2907.8 ml    Filed Weights   08/17/21 0500 08/18/21 0351 08/19/21 0500  Weight: 85.4 kg 88.3 kg 88.7 kg    General appearance: Awake alert.  In no distress.  Pleasantly confused Resp: Mildly tachypneic but less so compared to yesterday.  Diminished air entry at the bases with few crackles.  No wheezing. Cardio: S1-S2 is irregularly irregular GI: Abdomen is soft.  Nontender nondistended.  Bowel sounds are present normal.  No masses organomegaly Extremities: No edema.  Moving all of his extremities Neurologic:  No focal neurological deficits.     Lab Results:  Data Reviewed: I have personally reviewed labs and imaging study reports  CBC: Recent Labs  Lab 08/15/21 1650 08/15/21 1743 08/16/21 0149 08/16/21 0440 08/16/21 0601 08/17/21 0246  08/18/21 0029 08/19/21 0246  WBC 11.3*  --  10.4  --  13.8* 9.1 8.2 8.5  NEUTROABS 10.1*  --   --   --   --   --   --   --   HGB 12.1*   < > 10.7* 11.6* 11.0* 11.0* 10.9* 10.8*  HCT 37.1*   < > 34.2* 34.0* 36.7* 34.0* 33.8* 34.6*  MCV 82.1  --  84.4  --  86.6 82.1 83.5 84.4  PLT 189  --  137*  --  147* 180 180 219   < > = values in this interval not displayed.     Basic Metabolic Panel: Recent Labs  Lab 08/15/21 1650 08/15/21 1743 08/16/21 0149 08/16/21 0440 08/17/21 1031 08/18/21 0029 08/19/21 0246  NA 129*   < > 133* 133* 134* 136 135  K 3.4*   < > 3.7 4.3 3.4* 3.9 3.9  CL 95*  --  98  --  100 107 101  CO2 24  --  24  --  25 25 27   GLUCOSE 155*  --  128*  --  132* 105* 99  BUN 31*  --  27*  --  24* 16 12  CREATININE 1.94*  --  1.61*  --  1.19 1.05 1.19  CALCIUM 8.8*  --  8.4*  --  8.2* 7.7* 7.9*  MG  --   --  2.0  --  2.1  --  2.0  PHOS  --   --  3.9  --   --   --   --    < > = values in this interval not displayed.     GFR: Estimated Creatinine Clearance: 64.3 mL/min (by C-G formula based on SCr of 1.19 mg/dL).  Liver Function Tests: Recent Labs  Lab 08/15/21 1650  AST 68*  ALT 36  ALKPHOS 119  BILITOT 1.4*  PROT 7.2  ALBUMIN 3.2*     Recent Labs  Lab 08/16/21 0149  AMMONIA 31     Coagulation Profile: Recent Labs  Lab 08/15/21 1650  INR 1.6*       HbA1C: No results for input(s): HGBA1C in the last 72 hours.   CBG: Recent Labs  Lab 08/18/21 1529 08/18/21 1925  08/18/21 2320 08/19/21 0337 08/19/21 0732  GLUCAP 153* 83 108* 96 88       Recent Results (from the past 240 hour(s))  Culture, blood (Routine x 2)     Status: Abnormal   Collection Time: 08/15/21  4:50 PM   Specimen: BLOOD  Result Value Ref Range Status   Specimen Description BLOOD SITE NOT SPECIFIED  Final   Special Requests   Final    BOTTLES DRAWN AEROBIC AND ANAEROBIC Blood Culture results may not be optimal due to an excessive volume of blood received in  culture bottles   Culture  Setup Time   Final    GRAM POSITIVE COCCI IN BOTH AEROBIC AND ANAEROBIC BOTTLES CRITICAL VALUE NOTED.  VALUE IS CONSISTENT WITH PREVIOUSLY REPORTED AND CALLED VALUE.    Culture (A)  Final    ENTEROCOCCUS FAECALIS SUSCEPTIBILITIES PERFORMED ON PREVIOUS CULTURE WITHIN THE LAST 5 DAYS. Performed at [facility], [place] [address], [place], [place] [address]    Report Status 08/18/2021 FINAL  Final  Resp Panel by RT-PCR (Flu A&B, Covid) Peripheral     Status: None   Collection Time: 08/15/21  4:51 PM   Specimen: Peripheral; Nasopharyngeal(NP) swabs in vial transport medium  Result Value Ref Range Status   SARS Coronavirus 2 by RT PCR NEGATIVE NEGATIVE Final    Comment: (NOTE) SARS-CoV-2 target nucleic acids are NOT DETECTED.  The SARS-CoV-2 RNA is generally detectable in upper respiratory specimens during the acute phase of infection. The lowest concentration of SARS-CoV-2 viral copies this assay can detect is 138 copies/mL. A negative result does not preclude SARS-Cov-2 infection and should not be used as the sole basis for treatment or other patient management decisions. A negative result may occur with  improper specimen collection/handling, submission of specimen other than nasopharyngeal swab, presence of viral mutation(s) within the areas targeted by this assay, and inadequate number of viral copies(<138 copies/mL). A negative result must be combined with clinical observations, patient history, and epidemiological information. The expected result is Negative.  Fact Sheet for Patients:  EntrepreneurPulse.com.au  Fact Sheet for Healthcare Providers:  IncredibleEmployment.be  This test is no t yet approved or cleared by the [place] FDA and  has been authorized for detection and/or diagnosis of SARS-CoV-2 by FDA under an Emergency Use Authorization (EUA). This EUA will remain  in effect (meaning this test  can be used) for the duration of the COVID-19 declaration under Section 564(b)(1) of the Act, 21 U.S.C.section 360bbb-3(b)(1), unless the authorization is terminated  or revoked sooner.       Influenza A by PCR NEGATIVE NEGATIVE Final   Influenza B by PCR NEGATIVE NEGATIVE Final    Comment: (NOTE) The Xpert Xpress SARS-CoV-2/FLU/RSV plus assay is intended as an aid in the diagnosis of influenza from Nasopharyngeal swab specimens and should not be used as a sole basis for treatment. Nasal washings and aspirates are unacceptable for Xpert Xpress SARS-CoV-2/FLU/RSV testing.  Fact Sheet for Patients: EntrepreneurPulse.com.au  Fact Sheet for Healthcare Providers: IncredibleEmployment.be  This test is not yet approved or cleared by the [place] FDA and has been authorized for detection and/or diagnosis of SARS-CoV-2 by FDA under an Emergency Use Authorization (EUA). This EUA will remain in effect (meaning this test can be used) for the duration of the COVID-19 declaration under Section 564(b)(1) of the Act, 21 U.S.C. section 360bbb-3(b)(1), unless the authorization is terminated or revoked.  Performed at [facility], [place] [address] [place], [place]  [address]   Culture, blood (Routine x 2)     Status: Abnormal   Collection Time: 08/15/21  5:02 PM   Specimen: BLOOD  Result Value Ref Range Status   Specimen Description BLOOD SITE NOT SPECIFIED  Final   Special Requests   Final    BOTTLES DRAWN AEROBIC AND ANAEROBIC Blood Culture results may not be optimal due to an excessive volume of blood received in culture bottles   Culture  Setup Time   Final    GRAM POSITIVE COCCI IN CHAINS IN BOTH AEROBIC AND ANAEROBIC BOTTLES CRITICAL RESULT CALLED TO, READ BACK BY AND VERIFIED WITHJiles Garter [name] PHARMD @0817  08/16/21 EB Performed at [facility], [place] [address] [place], [place] [address]    Culture ENTEROCOCCUS FAECALIS (A)  Final    Report Status 08/18/2021 FINAL  Final   Organism ID, Bacteria ENTEROCOCCUS FAECALIS  Final      Susceptibility   Enterococcus faecalis - MIC*    AMPICILLIN <=2 SENSITIVE Sensitive     VANCOMYCIN 1 SENSITIVE Sensitive     GENTAMICIN SYNERGY SENSITIVE Sensitive     * ENTEROCOCCUS FAECALIS  Blood Culture ID Panel (Reflexed)     Status: Abnormal   Collection Time: 08/15/21  5:02 PM  Result Value Ref Range Status   Enterococcus faecalis DETECTED (A) NOT DETECTED Final    Comment: CRITICAL RESULT CALLED TO, READ BACK BY AND VERIFIED WITH: H, VON DOHLEN PHARMD @0817  08/16/21 EB    Enterococcus Faecium NOT DETECTED NOT DETECTED Final   Listeria monocytogenes NOT DETECTED NOT DETECTED Final   Staphylococcus species NOT DETECTED NOT DETECTED Final   Staphylococcus aureus (BCID) NOT DETECTED NOT DETECTED Final   Staphylococcus epidermidis NOT DETECTED NOT DETECTED Final   Staphylococcus lugdunensis NOT DETECTED NOT DETECTED Final   Streptococcus species NOT DETECTED NOT DETECTED Final   Streptococcus agalactiae NOT DETECTED NOT DETECTED Final   Streptococcus pneumoniae NOT DETECTED NOT DETECTED Final   Streptococcus pyogenes NOT DETECTED NOT DETECTED Final   A.calcoaceticus-baumannii NOT DETECTED NOT DETECTED Final   Bacteroides fragilis NOT DETECTED NOT DETECTED Final   Enterobacterales NOT DETECTED NOT DETECTED Final   Enterobacter cloacae complex NOT DETECTED NOT DETECTED Final   Escherichia coli NOT DETECTED NOT DETECTED Final   Klebsiella aerogenes NOT DETECTED NOT DETECTED Final   Klebsiella oxytoca NOT DETECTED NOT DETECTED Final   Klebsiella pneumoniae NOT DETECTED NOT DETECTED Final   Proteus species NOT DETECTED NOT DETECTED Final   Salmonella species NOT DETECTED NOT DETECTED Final   Serratia marcescens NOT DETECTED NOT DETECTED Final   Haemophilus influenzae NOT DETECTED NOT DETECTED Final   Neisseria meningitidis NOT DETECTED NOT DETECTED Final   Pseudomonas aeruginosa NOT  DETECTED NOT DETECTED Final   Stenotrophomonas maltophilia NOT DETECTED NOT DETECTED Final   Candida albicans NOT DETECTED NOT DETECTED Final   Candida auris NOT DETECTED NOT DETECTED Final   Candida glabrata NOT DETECTED NOT DETECTED Final   Candida krusei NOT DETECTED NOT DETECTED Final   Candida parapsilosis NOT DETECTED NOT DETECTED Final   Candida tropicalis NOT DETECTED NOT DETECTED Final   Cryptococcus neoformans/gattii NOT DETECTED NOT DETECTED Final   Vancomycin resistance NOT DETECTED NOT DETECTED Final    Comment: Performed at [facility], [place] [address], [place], [place] [address]  Urine Culture     Status: None   Collection Time: 08/15/21  6:57 PM   Specimen: In/Out Cath Urine  Result Value Ref Range Status  Specimen Description IN/OUT CATH URINE  Final   Special Requests NONE  Final   Culture   Final    NO GROWTH Performed at [facility], [place] [address] [place], [place] [address]    Report Status 08/17/2021 FINAL  Final  MRSA Next Gen by PCR, Nasal     Status: None   Collection Time: 08/15/21 11:30 PM   Specimen: Nasal Mucosa; Nasal Swab  Result Value Ref Range Status   MRSA by PCR Next Gen NOT DETECTED NOT DETECTED Final    Comment: (NOTE) The GeneXpert MRSA Assay (FDA approved for NASAL specimens only), is one component of a comprehensive MRSA colonization surveillance program. It is not intended to diagnose MRSA infection nor to guide or monitor treatment for MRSA infections. Test performance is not FDA approved in patients less than 45 years old. Performed at [facility], [place] [address] [place], [place] [address]   Culture, blood (Routine X 2) w Reflex to ID Panel     Status: None (Preliminary result)   Collection Time: 08/16/21  2:57 PM   Specimen: BLOOD  Result Value Ref Range Status   Specimen Description BLOOD SITE NOT SPECIFIED  Final   Special Requests   Final    AEROBIC BOTTLE ONLY Blood Culture results may not be optimal due  to an inadequate volume of blood received in culture bottles   Culture   Final    NO GROWTH 3 DAYS Performed at [facility], [place] [address] [place], [place] [address]    Report Status PENDING  Incomplete  Culture, blood (Routine X 2) w Reflex to ID Panel     Status: None (Preliminary result)   Collection Time: 08/16/21  2:58 PM   Specimen: BLOOD  Result Value Ref Range Status   Specimen Description BLOOD SITE NOT SPECIFIED  Final   Special Requests   Final    AEROBIC BOTTLE ONLY Blood Culture results may not be optimal due to an inadequate volume of blood received in culture bottles   Culture   Final    NO GROWTH 3 DAYS Performed at [facility], [place] [address] [place], [place] [address]    Report Status PENDING  Incomplete       Radiology Studies: DG Chest 1 View  Result Date: 08/17/2021 CLINICAL DATA:  Edema EXAM: CHEST  1 VIEW COMPARISON:  Chest x-ray 08/15/2021, CT 08/16/2021, radiograph 04/15/2021 FINDINGS: Post sternotomy changes with aortic stent graft. Emphysema and chronic interstitial disease. Left-sided pleural effusion, similar right pleural effusion, effusions appears slightly loculated. Peripheral parenchymal opacities are also unchanged and could be due to chronic scarring. Cardiomegaly with vascular congestion. IMPRESSION: 1. Chronic bilateral effusions with slight increased pleural effusion on the left compared to prior. 2. Cardiomegaly with slight central congestion 3. Emphysema with chronic interstitial disease. Electronically Signed   By: Donavan Foil M.D.   On: 08/17/2021 16:56   CT ABDOMEN PELVIS W CONTRAST  Result Date: 08/17/2021 CLINICAL DATA:  Sepsis EXAM: CT ABDOMEN AND PELVIS WITH CONTRAST TECHNIQUE: Multidetector CT imaging of the abdomen and pelvis was performed using the standard protocol following bolus administration of intravenous contrast. CONTRAST:  153mL OMNIPAQUE IOHEXOL 300 MG/ML  SOLN COMPARISON:  01/29/2021 FINDINGS: Lower chest:  Cardiomegaly. Unchanged small, loculated bilateral pleural effusions associated rounded atelectasis. Hepatobiliary: No solid liver abnormality is seen. Gallstones and sludge contracted in the gallbladder. Gallbladder wall thickening, or biliary dilatation. Pancreas: Unremarkable. No pancreatic ductal dilatation or surrounding  inflammatory changes. Spleen: Normal in size without significant abnormality. Adrenals/Urinary Tract: Adrenal glands are unremarkable. Kidneys are normal, without renal calculi, solid lesion, or hydronephrosis. Foley catheter in the urinary bladder. Thickening of the urinary bladder wall, likely related to chronic outlet obstruction. Stomach/Bowel: Stomach is within normal limits. Appendix is not clearly visualized. No evidence of bowel wall thickening, distention, or inflammatory changes. Descending and sigmoid diverticulosis. Vascular/Lymphatic: Aortic atherosclerosis. 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 The abdominal aorta measures up to 4.7 x 4.0 cm in maximum caliber near the diaphragmatic hiatus. No enlarged abdominal or pelvic lymph nodes. Reproductive: Prostatomegaly. Other: Anasarca.  No abdominopelvic ascites. Musculoskeletal: No acute or significant osseous findings. IMPRESSION: 1. No acute CT findings of the abdomen or pelvis to explain sepsis. 2. Gallstones and sludge contracted in the gallbladder. No CT evidence of acute cholecystitis. 3. Descending and sigmoid diverticulosis without evidence of acute diverticulitis. 4. 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 The abdominal aorta measures up to 4.7 x 4.0 cm in maximum caliber near the diaphragmatic hiatus. Aortic Atherosclerosis (ICD10-I70.0). Electronically Signed   By: Delanna Ahmadi M.D.   On: 08/17/2021 16:25       LOS: 4 days   [facility] Pager on [email or web address]  08/19/2021, 10:08 AM

## 2021-08-20 DIAGNOSIS — I5033 Acute on chronic diastolic (congestive) heart failure: Secondary | ICD-10-CM | POA: Diagnosis not present

## 2021-08-20 DIAGNOSIS — R7881 Bacteremia: Secondary | ICD-10-CM | POA: Diagnosis not present

## 2021-08-20 DIAGNOSIS — B952 Enterococcus as the cause of diseases classified elsewhere: Secondary | ICD-10-CM | POA: Diagnosis not present

## 2021-08-20 LAB — BASIC METABOLIC PANEL
Anion gap: 9 (ref 5–15)
BUN: 13 mg/dL (ref 8–23)
CO2: 25 mmol/L (ref 22–32)
Calcium: 8.1 mg/dL — ABNORMAL LOW (ref 8.9–10.3)
Chloride: 99 mmol/L (ref 98–111)
Creatinine, Ser: 1.23 mg/dL (ref 0.61–1.24)
GFR, Estimated: >60 mL/min (ref >60)
Glucose, Bld: 92 mg/dL (ref 70–99)
Potassium: 4 mmol/L (ref 3.5–5.1)
Sodium: 133 mmol/L — ABNORMAL LOW (ref 135–145)

## 2021-08-20 LAB — CBC
HCT: 35.3 % — ABNORMAL LOW (ref 39.0–52.0)
Hemoglobin: 10.9 g/dL — ABNORMAL LOW (ref 13.0–17.0)
MCH: 26.3 pg (ref 26.0–34.0)
MCHC: 30.9 g/dL (ref 30.0–36.0)
MCV: 85.1 fL (ref 80.0–100.0)
Platelets: 265 10*3/uL (ref 150–400)
RBC: 4.15 MIL/uL — ABNORMAL LOW (ref 4.22–5.81)
RDW: 16.5 % — ABNORMAL HIGH (ref 11.5–15.5)
WBC: 9.4 10*3/uL (ref 4.0–10.5)
nRBC: 0 % (ref 0.0–0.2)

## 2021-08-20 LAB — HEPARIN LEVEL (UNFRACTIONATED)
Heparin Unfractionated: 0.21 IU/mL — ABNORMAL LOW (ref 0.30–0.70)
Heparin Unfractionated: 0.47 IU/mL (ref 0.30–0.70)

## 2021-08-20 LAB — GLUCOSE, CAPILLARY
Glucose-Capillary: 102 mg/dL — ABNORMAL HIGH (ref 70–99)
Glucose-Capillary: 100 mg/dL — ABNORMAL HIGH (ref 70–99)
Glucose-Capillary: 89 mg/dL (ref 70–99)
Glucose-Capillary: 126 mg/dL — ABNORMAL HIGH (ref 70–99)
Glucose-Capillary: 108 mg/dL — ABNORMAL HIGH (ref 70–99)
Glucose-Capillary: 139 mg/dL — ABNORMAL HIGH (ref 70–99)
Glucose-Capillary: 123 mg/dL — ABNORMAL HIGH (ref 70–99)
Glucose-Capillary: 94 mg/dL (ref 70–99)

## 2021-08-20 NOTE — Evaluation (Signed)
Physical Therapy Evaluation Patient Details Name: Sean Riley MRN: [id number] DOB: 02-19-1951 Today's Date: 08/20/2021  History of Present Illness  Pt is a 71 yo male admitted with confusion, ocugh and SOB, hypoxia requiring Bipap.  Pt found to have enterococcus bacteremia, endocarditis and diastolic heart failure.  Pt with PMH of COPD on home O2, afib, CHF, AAA repair and HTN   Clinical Impression  Pt admitted with above. Pt requiring minA for bed mobility and std pvt to chair. Pt fatigued quickly and with noted SpO2 dropping down to 87% on 6lO2 via [place] during transfer to chair. Wife present and reports "I want him home." Wife states she hopes that he can come home Friday or Saturday. With daily therapy suspect pt will be able to reach [facility] level but will need to be able to amb minimally 50' and complete stair negotiation to enter home. Acute PT to cont to follow.       Recommendations for follow up therapy are one component of a multi-disciplinary discharge planning process, led by the attending physician.  Recommendations may be updated based on patient status, additional functional criteria and insurance authorization.  Follow Up Recommendations Home health PT    Assistance Recommended at Discharge Frequent or constant Supervision/Assistance  Patient can return home with the following  A little help with walking and/or transfers;A little help with bathing/dressing/bathroom;Assist for transportation;Help with stairs or ramp for entrance    Equipment Recommendations  (has DME at home)  Recommendations for Other Services       Functional Status Assessment Patient has had a recent decline in their functional status and demonstrates the ability to make significant improvements in function in a reasonable and predictable amount of time.     Precautions / Restrictions Precautions Precautions: Fall;Other (comment) Precaution Comments: check O2 levels Restrictions Weight Bearing  Restrictions: No      Mobility  Bed Mobility Overal bed mobility: Needs Assistance Bed Mobility: Supine to Sit     Supine to sit: Min assist;HOB elevated     General bed mobility comments: pt requiring verbal directional cues to initiate task, wife reaching to help patient, minA for trunk elevation    Transfers Overall transfer level: Needs assistance Equipment used: Rolling walker (2 wheels) Transfers: Sit to/from Stand;Bed to chair/wheelchair/BSC Sit to Stand: Min assist Stand pivot transfers: Min assist Step pivot transfers: Min assist       General transfer comment: verbal cues for hand placement, increased time, use momentum to push self up, bed elevated, max verbal cues for safety and line management when transferring to chair from EOB    Ambulation/Gait               General Gait Details: limited to step pvt to chair as pt with noted bilat weakness reporting "i can't do this, my legs are very tired". Spo2 dropped to 87% on 6Lo2 via [place] during activity, noted SOB  Stairs            Wheelchair Mobility    Modified Rankin (Stroke Patients Only)       Balance Overall balance assessment: Needs assistance Sitting-balance support: Feet supported Sitting balance-[name]: Good     Standing balance support: Bilateral upper extremity supported;During functional activity;Reliant on assistive device for balance Standing balance-[name]: Poor Standing balance comment: reliant on walker at sink during grooming  Pertinent Vitals/Pain Pain Assessment: No/denies pain Faces Pain Scale: Hurts a little bit Pain Location: back Pain Descriptors / Indicators: Aching Pain Intervention(s): Monitored during session    Home Living Family/patient expects to be discharged to:: Private residence Living Arrangements: Spouse/significant other Available Help at Discharge: Family;Available 24 hours/day Type of Home:  House Home Access: Stairs to enter Entrance Stairs-Rails: Psychiatric nurse of Steps: 5   Home Layout: One level Home Equipment: Conservation officer, nature (2 wheels);Cane - single point;Rollator (4 wheels);Shower seat;BSC/3in1 Additional Comments: Pt sleep in his lift recliner.  Also has an adjustable bed    Prior Function Prior Level of Function : Needs assist       Physical Assist : ADLs (physical);Mobility (physical) Mobility (physical): Gait (uses cane outside of home) ADLs (physical): Dressing (some assist with LE dressing when fatigued) Mobility Comments: uses cane outside of home ADLs Comments: sometimes needs assist with LE adls when fatigued but can do them alone if needed     Hand Dominance   Dominant Hand: Right    Extremity/Trunk Assessment   Upper Extremity Assessment Upper Extremity Assessment: Overall WFL for tasks assessed    Lower Extremity Assessment Lower Extremity Assessment: Generalized weakness    Cervical / Trunk Assessment Cervical / Trunk Assessment: Normal  Communication   Communication: HOH  Cognition Arousal/Alertness: Awake/alert Behavior During Therapy: WFL for tasks assessed/performed Overall Cognitive Status: Within Functional Limits for tasks assessed Area of Impairment: Problem solving                 Orientation Level: Time;Disoriented to           Problem Solving: Slow processing;Decreased initiation General Comments: mild delay in response time however pt very HOH        General Comments General comments (skin integrity, edema, etc.): pt with noted edema t/o bilat LEs    Exercises     Assessment/Plan    PT Assessment Patient needs continued PT services  PT Problem List Decreased strength;Decreased activity tolerance;Decreased balance;Decreased mobility;Decreased knowledge of use of DME       PT Treatment Interventions DME instruction;Gait training;Stair training;Functional mobility training;Therapeutic  activities;Therapeutic exercise;Balance training    PT Goals (Current goals can be found in the Care Plan section)  Acute Rehab PT Goals Patient Stated Goal: home PT Goal Formulation: With patient/family Time For Goal Achievement: 09/03/21 Potential to Achieve Goals: Good    Frequency Min 4X/week     Co-evaluation               AM-PAC PT "6 Clicks" Mobility  Outcome Measure Help needed turning from your back to your side while in a flat bed without using bedrails?: A Little Help needed moving from lying on your back to sitting on the side of a flat bed without using bedrails?: A Little Help needed moving to and from a bed to a chair (including a wheelchair)?: A Little Help needed standing up from a chair using your arms (e.g., wheelchair or bedside chair)?: A Little Help needed to walk in hospital room?: A Lot Help needed climbing 3-5 steps with a railing? : A Lot 6 Click Score: 16    End of Session Equipment Utilized During Treatment: Gait belt Activity Tolerance: Patient tolerated treatment well Patient left: in chair;with call bell/phone within reach;with chair alarm set Nurse Communication: Mobility status PT Visit Diagnosis: Unsteadiness on feet (R26.81);Muscle weakness (generalized) (M62.81);Difficulty in walking, not elsewhere classified (R26.2)    Time: 9417-4081 PT Time Calculation (min) (ACUTE ONLY):  45 min   Charges:   PT Evaluation $PT Eval Moderate Complexity: 1 Mod PT Treatments $Therapeutic Activity: 23-37 mins        Kittie Plater, PT, DPT Acute Rehabilitation Services Pager #: [phone] Office #: [phone]   Sean Riley 08/20/2021, 1:21 PM

## 2021-08-20 NOTE — Progress Notes (Signed)
PROGRESS NOTE    Sean Riley  UJW:[id number] DOB: [birth year] DOA: 08/15/2021 PCP: Colon Branch, MD   Chief Complain: Cough, confusion, shortness of breath  Brief Narrative:  Patient is a 71 year old male with history of COPD, on home oxygen, A. fib on Eliquis,/volume overload., ascending aortic dissection s/p repair, atrial valve replacement, hypertension, GI bleed, GERD who presented from home with complaints of confusion, cough, shortness of breath.  On presentation he was found to be hypoxic.  He had to put on BiPAP.  He was admitted under ICU service.  Found to have enterococcal bacteremia.  ID following.  Cardiology consulted for severe congestive heart failure/volume overload.HF team following.  Remains on IV diuresis and high flow oxygen today at 6L /min  Plan for TEE tomorrow.  Assessment & Plan:   Principal Problem:   Enterococcal bacteremia Active Problems:   Atrial fibrillation (HCC)   COPD (chronic obstructive pulmonary disease) (HCC)   Normocytic anemia   Acute on chronic respiratory failure with hypoxia (HCC)   Chronic diastolic CHF (congestive heart failure) (HCC)   Severe sepsis with septic shock (HCC)   AKI (acute kidney injury) (HCC)   Pressure injury of skin   Acute metabolic encephalopathy   Prosthetic valve endocarditis (HCC)   Acute on chronic diastolic CHF (congestive heart failure) (HCC)   Severe sepsis with septic shock: Secondary to Enterococcus faecalis bacteremia.  Source is unclear.  Concern for infective endocarditis.  Has history of bioprosthetic aortic valve.  ID following.  Currently on ampicillin, ceftriaxone.  Plan for TEE tomorrow.  Remains afebrile, no leukocytosis.  CT abdomen/pelvis did not show any obvious source but showed diverticulosis.  Repeat blood cultures have not shown any growth  Acute on chronic hypoxic respiratory failure: Multifactorial secondary to COPD, CHF.  Uses 3 to 4 L of oxygen per minute at home.  Currently on high flow  oxygen.  Today he was on 6 L/min high flow.  Chest x-ray on 1/8 showed emphysema, concern for volume overload.  Has chronic bilateral pleural effusion.  On IV diuresis. Continue bronchodilators as needed.   Acute on chronic diastolic congestive heart failure: Currently on furosemide.  Heart failure team following.  Monitor electrolytes.  Echo done here on 08/16/2021 showed EF of 55 to 60%, moderately elevated pulmonary artery pressure, moderately enlarged right ventricle  AKI on CKD stage IIIb: Baseline creatinine ranged from 1.3-1.5.  Currently kidney function at baseline.  Acute metabolic encephalopathy: Most likely secondary to sepsis.  Currently he is alert and oriented.  COPD: Currently without exacerbation.  Continue bronchodilators as needed.  Continue supplemental oxygen  Paroxysmal A. fib: On Eliquis for anticoagulation.  Currently on heparin drip.  On metoprolol and flecainide.  Has history of aortic valve replacement and has history of thoracic aortic aneurysm repair in 2018.  Cardiology following.  Normocytic anemia: Currently hemoglobin stable.  No signs of bleeding  History of sleep apnea: On CPAP  Deconditioning/debility: We will request for PT/OT evaluation when appropriate          DVT prophylaxis:Heparin drip Code Status: Full Family Communication: Wife at the bedside Patient status:Inpatient  Dispo: The patient is from: Home              Anticipated d/c is to: Home              Anticipated d/c date is: Still on high flow oxygen, need to complete work-up for bacteremia before discharge  Consultants: Cardiology, ID  Procedures: None  Antimicrobials:  Anti-infectives (From admission, onward)    Start     Dose/Rate Route Frequency Ordered Stop   08/17/21 1530  cefTRIAXone (ROCEPHIN) 2 g in sodium chloride 0.9 % 100 mL IVPB        2 g 200 mL/hr over 30 Minutes Intravenous Every 12 hours 08/17/21 1435     08/16/21 2000  vancomycin (VANCOREADY) IVPB 1250 mg/250  mL  Status:  Discontinued        1,250 mg 166.7 mL/hr over 90 Minutes Intravenous Every 24 hours 08/15/21 1957 08/16/21 0844   08/16/21 0930  ampicillin (OMNIPEN) 2 g in sodium chloride 0.9 % 100 mL IVPB        2 g 300 mL/hr over 20 Minutes Intravenous Every 4 hours 08/16/21 0844     08/16/21 0600  ceFEPIme (MAXIPIME) 2 g in sodium chloride 0.9 % 100 mL IVPB  Status:  Discontinued        2 g 200 mL/hr over 30 Minutes Intravenous Every 12 hours 08/15/21 1957 08/16/21 0844   08/15/21 1715  vancomycin (VANCOREADY) IVPB 1750 mg/350 mL        1,750 mg 175 mL/hr over 120 Minutes Intravenous  Once 08/15/21 1711 08/15/21 2102   08/15/21 1700  ceFEPIme (MAXIPIME) 2 g in sodium chloride 0.9 % 100 mL IVPB        2 g 200 mL/hr over 30 Minutes Intravenous  Once 08/15/21 1655 08/15/21 1803   08/15/21 1700  metroNIDAZOLE (FLAGYL) IVPB 500 mg        500 mg 100 mL/hr over 60 Minutes Intravenous  Once 08/15/21 1655 08/15/21 1833   08/15/21 1700  vancomycin (VANCOCIN) IVPB 1000 mg/200 mL premix  Status:  Discontinued        1,000 mg 200 mL/hr over 60 Minutes Intravenous  Once 08/15/21 1655 08/15/21 1711       Subjective: Patient seen and examined at the bedside this morning.  Hemodynamically stable , on 6 L high flow oxygen.  He was sitting on the chair.  Wife at the bedside.  He denies any worsening cough or shortness of breath.  Still looks weak, significantly deconditioned.  He was working with occupational therapist.  Objective: Vitals:   08/20/21 0400 08/20/21 0728 08/20/21 0754 08/20/21 0927  BP: (!) 115/59  134/69   Pulse: 65  67   Resp: 20  20   Temp: 97.8 F (36.6 C)  (!) 97.5 F (36.4 C)   TempSrc: Axillary  Oral   SpO2: 95% 97% 96% 94%  Weight: 92.2 kg     Height:        Intake/Output Summary (Last 24 hours) at 08/20/2021 1036 Last data filed at 08/20/2021 0434 Gross per 24 hour  Intake 240 ml  Output 2700 ml  Net -2460 ml   Filed Weights   08/18/21 0351 08/19/21 0500  08/20/21 0400  Weight: 88.3 kg 88.7 kg 92.2 kg    Examination:  General exam: Weak, chronically ill looking HEENT: PERRL Respiratory system: Bibasilar crackles, no wheezing  cardiovascular system: S1 & S2 heard, RRR.  Gastrointestinal system: Abdomen is nondistended, soft and nontender. Central nervous system: Alert and oriented Extremities: No edema, no clubbing ,no cyanosis Skin: No rashes, no ulcers,no icterus      Data Reviewed: I have personally reviewed following labs and imaging studies  CBC: Recent Labs  Lab 08/15/21 1650 08/15/21 1743 08/16/21 0601 08/17/21 0246 08/18/21 0029 08/19/21 0246 08/20/21 0220  WBC 11.3*   < > 13.8* 9.1 8.2  8.5 9.4  NEUTROABS 10.1*  --   --   --   --   --   --   HGB 12.1*   < > 11.0* 11.0* 10.9* 10.8* 10.9*  HCT 37.1*   < > 36.7* 34.0* 33.8* 34.6* 35.3*  MCV 82.1   < > 86.6 82.1 83.5 84.4 85.1  PLT 189   < > 147* 180 180 219 265   < > = values in this interval not displayed.   Basic Metabolic Panel: Recent Labs  Lab 08/16/21 0149 08/16/21 0440 08/17/21 1031 08/18/21 0029 08/19/21 0246 08/20/21 0220  NA 133* 133* 134* 136 135 133*  K 3.7 4.3 3.4* 3.9 3.9 4.0  CL 98  --  100 107 101 99  CO2 24  --  25 25 27 25   GLUCOSE 128*  --  132* 105* 99 92  BUN 27*  --  24* 16 12 13   CREATININE 1.61*  --  1.19 1.05 1.19 1.23  CALCIUM 8.4*  --  8.2* 7.7* 7.9* 8.1*  MG 2.0  --  2.1  --  2.0  --   PHOS 3.9  --   --   --   --   --    GFR: Estimated Creatinine Clearance: 62.3 mL/min (by C-G formula based on SCr of 1.23 mg/dL). Liver Function Tests: Recent Labs  Lab 08/15/21 1650  AST 68*  ALT 36  ALKPHOS 119  BILITOT 1.4*  PROT 7.2  ALBUMIN 3.2*   No results for input(s): LIPASE, AMYLASE in the last 168 hours. Recent Labs  Lab 08/16/21 0149  AMMONIA 31   Coagulation Profile: Recent Labs  Lab 08/15/21 1650  INR 1.6*   Cardiac Enzymes: No results for input(s): CKTOTAL, CKMB, CKMBINDEX, TROPONINI in the last 168  hours. BNP (last 3 results) No results for input(s): PROBNP in the last 8760 hours. HbA1C: No results for input(s): HGBA1C in the last 72 hours. CBG: Recent Labs  Lab 08/19/21 1633 08/19/21 2023 08/20/21 0153 08/20/21 0439 08/20/21 0756  GLUCAP 85 143* 102* 100* 89   Lipid Profile: No results for input(s): CHOL, HDL, LDLCALC, TRIG, CHOLHDL, LDLDIRECT in the last 72 hours. Thyroid Function Tests: No results for input(s): TSH, T4TOTAL, FREET4, T3FREE, THYROIDAB in the last 72 hours. Anemia Panel: No results for input(s): VITAMINB12, FOLATE, FERRITIN, TIBC, IRON, RETICCTPCT in the last 72 hours. Sepsis Labs: Recent Labs  Lab 08/15/21 1650 08/16/21 0149 08/17/21 0246  PROCALCITON  --  0.78 0.51  LATICACIDVEN 1.3  --   --     Recent Results (from the past 240 hour(s))  Culture, blood (Routine x 2)     Status: Abnormal   Collection Time: 08/15/21  4:50 PM   Specimen: BLOOD  Result Value Ref Range Status   Specimen Description BLOOD SITE NOT SPECIFIED  Final   Special Requests   Final    BOTTLES DRAWN AEROBIC AND ANAEROBIC Blood Culture results may not be optimal due to an excessive volume of blood received in culture bottles   Culture  Setup Time   Final    GRAM POSITIVE COCCI IN BOTH AEROBIC AND ANAEROBIC BOTTLES CRITICAL VALUE NOTED.  VALUE IS CONSISTENT WITH PREVIOUSLY REPORTED AND CALLED VALUE.    Culture (A)  Final    ENTEROCOCCUS FAECALIS SUSCEPTIBILITIES PERFORMED ON PREVIOUS CULTURE WITHIN THE LAST 5 DAYS. Performed at [facility], [place] [address], [place], [place] [address]    Report Status 08/18/2021 FINAL  Final  Resp Panel  by RT-PCR (Flu A&B, Covid) Peripheral     Status: None   Collection Time: 08/15/21  4:51 PM   Specimen: Peripheral; Nasopharyngeal(NP) swabs in vial transport medium  Result Value Ref Range Status   SARS Coronavirus 2 by RT PCR NEGATIVE NEGATIVE Final    Comment: (NOTE) SARS-CoV-2 target nucleic acids are NOT DETECTED.  The  SARS-CoV-2 RNA is generally detectable in upper respiratory specimens during the acute phase of infection. The lowest concentration of SARS-CoV-2 viral copies this assay can detect is 138 copies/mL. A negative result does not preclude SARS-Cov-2 infection and should not be used as the sole basis for treatment or other patient management decisions. A negative result may occur with  improper specimen collection/handling, submission of specimen other than nasopharyngeal swab, presence of viral mutation(s) within the areas targeted by this assay, and inadequate number of viral copies(<138 copies/mL). A negative result must be combined with clinical observations, patient history, and epidemiological information. The expected result is Negative.  Fact Sheet for Patients:  EntrepreneurPulse.com.au  Fact Sheet for Healthcare Providers:  IncredibleEmployment.be  This test is no t yet approved or cleared by the [place] FDA and  has been authorized for detection and/or diagnosis of SARS-CoV-2 by FDA under an Emergency Use Authorization (EUA). This EUA will remain  in effect (meaning this test can be used) for the duration of the COVID-19 declaration under Section 564(b)(1) of the Act, 21 U.S.C.section 360bbb-3(b)(1), unless the authorization is terminated  or revoked sooner.       Influenza A by PCR NEGATIVE NEGATIVE Final   Influenza B by PCR NEGATIVE NEGATIVE Final    Comment: (NOTE) The Xpert Xpress SARS-CoV-2/FLU/RSV plus assay is intended as an aid in the diagnosis of influenza from Nasopharyngeal swab specimens and should not be used as a sole basis for treatment. Nasal washings and aspirates are unacceptable for Xpert Xpress SARS-CoV-2/FLU/RSV testing.  Fact Sheet for Patients: EntrepreneurPulse.com.au  Fact Sheet for Healthcare Providers: IncredibleEmployment.be  This test is not yet approved or  cleared by the [place] FDA and has been authorized for detection and/or diagnosis of SARS-CoV-2 by FDA under an Emergency Use Authorization (EUA). This EUA will remain in effect (meaning this test can be used) for the duration of the COVID-19 declaration under Section 564(b)(1) of the Act, 21 U.S.C. section 360bbb-3(b)(1), unless the authorization is terminated or revoked.  Performed at [facility], [place] [address], [place], [place] [address]   Culture, blood (Routine x 2)     Status: Abnormal   Collection Time: 08/15/21  5:02 PM   Specimen: BLOOD  Result Value Ref Range Status   Specimen Description BLOOD SITE NOT SPECIFIED  Final   Special Requests   Final    BOTTLES DRAWN AEROBIC AND ANAEROBIC Blood Culture results may not be optimal due to an excessive volume of blood received in culture bottles   Culture  Setup Time   Final    GRAM POSITIVE COCCI IN CHAINS IN BOTH AEROBIC AND ANAEROBIC BOTTLES CRITICAL RESULT CALLED TO, READ BACK BY AND VERIFIED WITHJiles Garter [facility] PHARMD @0817  08/16/21 EB Performed at [facility], [place] [address], [place], [place] [address]    Culture ENTEROCOCCUS FAECALIS (A)  Final   Report Status 08/18/2021 FINAL  Final   Organism ID, Bacteria ENTEROCOCCUS FAECALIS  Final      Susceptibility   Enterococcus faecalis - MIC*    AMPICILLIN <=2 SENSITIVE Sensitive     VANCOMYCIN 1 SENSITIVE  Sensitive     GENTAMICIN SYNERGY SENSITIVE Sensitive     * ENTEROCOCCUS FAECALIS  Blood Culture ID Panel (Reflexed)     Status: Abnormal   Collection Time: 08/15/21  5:02 PM  Result Value Ref Range Status   Enterococcus faecalis DETECTED (A) NOT DETECTED Final    Comment: CRITICAL RESULT CALLED TO, READ BACK BY AND VERIFIED WITH: Jiles Garter DOHLEN PHARMD @0817  08/16/21 EB    Enterococcus Faecium NOT DETECTED NOT DETECTED Final   Listeria monocytogenes NOT DETECTED NOT DETECTED Final   Staphylococcus species NOT DETECTED NOT DETECTED Final   Staphylococcus  aureus (BCID) NOT DETECTED NOT DETECTED Final   Staphylococcus epidermidis NOT DETECTED NOT DETECTED Final   Staphylococcus lugdunensis NOT DETECTED NOT DETECTED Final   Streptococcus species NOT DETECTED NOT DETECTED Final   Streptococcus agalactiae NOT DETECTED NOT DETECTED Final   Streptococcus pneumoniae NOT DETECTED NOT DETECTED Final   Streptococcus pyogenes NOT DETECTED NOT DETECTED Final   A.calcoaceticus-baumannii NOT DETECTED NOT DETECTED Final   Bacteroides fragilis NOT DETECTED NOT DETECTED Final   Enterobacterales NOT DETECTED NOT DETECTED Final   Enterobacter cloacae complex NOT DETECTED NOT DETECTED Final   Escherichia coli NOT DETECTED NOT DETECTED Final   Klebsiella aerogenes NOT DETECTED NOT DETECTED Final   Klebsiella oxytoca NOT DETECTED NOT DETECTED Final   Klebsiella pneumoniae NOT DETECTED NOT DETECTED Final   Proteus species NOT DETECTED NOT DETECTED Final   Salmonella species NOT DETECTED NOT DETECTED Final   Serratia marcescens NOT DETECTED NOT DETECTED Final   Haemophilus influenzae NOT DETECTED NOT DETECTED Final   Neisseria meningitidis NOT DETECTED NOT DETECTED Final   Pseudomonas aeruginosa NOT DETECTED NOT DETECTED Final   Stenotrophomonas maltophilia NOT DETECTED NOT DETECTED Final   Candida albicans NOT DETECTED NOT DETECTED Final   Candida auris NOT DETECTED NOT DETECTED Final   Candida glabrata NOT DETECTED NOT DETECTED Final   Candida krusei NOT DETECTED NOT DETECTED Final   Candida parapsilosis NOT DETECTED NOT DETECTED Final   Candida tropicalis NOT DETECTED NOT DETECTED Final   Cryptococcus neoformans/gattii NOT DETECTED NOT DETECTED Final   Vancomycin resistance NOT DETECTED NOT DETECTED Final    Comment: Performed at [facility], [address] [place] [address] [place], [place] [address]  Urine Culture     Status: None   Collection Time: 08/15/21  6:57 PM   Specimen: In/Out Cath Urine  Result Value Ref Range Status   Specimen Description IN/OUT CATH  URINE  Final   Special Requests NONE  Final   Culture   Final    NO GROWTH Performed at [facility], [place] [address], [place], [place] [address]    Report Status 08/17/2021 FINAL  Final  MRSA Next Gen by PCR, Nasal     Status: None   Collection Time: 08/15/21 11:30 PM   Specimen: Nasal Mucosa; Nasal Swab  Result Value Ref Range Status   MRSA by PCR Next Gen NOT DETECTED NOT DETECTED Final    Comment: (NOTE) The GeneXpert MRSA Assay (FDA approved for NASAL specimens only), is one component of a comprehensive MRSA colonization surveillance program. It is not intended to diagnose MRSA infection nor to guide or monitor treatment for MRSA infections. Test performance is not FDA approved in patients less than 47 years old. Performed at [facility], [place] [address], [place], [place] [address]   Culture, blood (Routine X 2) w Reflex to ID Panel     Status: None (Preliminary result)  Collection Time: 08/16/21  2:57 PM   Specimen: BLOOD  Result Value Ref Range Status   Specimen Description BLOOD SITE NOT SPECIFIED  Final   Special Requests   Final    AEROBIC BOTTLE ONLY Blood Culture results may not be optimal due to an inadequate volume of blood received in culture bottles   Culture   Final    NO GROWTH 3 DAYS Performed at [facility], [place] [address], [place], [place] [address]    Report Status PENDING  Incomplete  Culture, blood (Routine X 2) w Reflex to ID Panel     Status: None (Preliminary result)   Collection Time: 08/16/21  2:58 PM   Specimen: BLOOD  Result Value Ref Range Status   Specimen Description BLOOD SITE NOT SPECIFIED  Final   Special Requests   Final    AEROBIC BOTTLE ONLY Blood Culture results may not be optimal due to an inadequate volume of blood received in culture bottles   Culture   Final    NO GROWTH 3 DAYS Performed at [facility], [place] [address] [place], [place] [address]    Report Status PENDING  Incomplete          Radiology Studies: No results found.      Scheduled Meds:  ALPRAZolam  1 mg Oral QHS   Chlorhexidine Gluconate Cloth  6 each Topical Daily   escitalopram  10 mg Oral Daily   flecainide  75 mg Oral BID   furosemide  40 mg Intravenous BID   insulin aspart  0-15 Units Subcutaneous Q4H   mouth rinse  15 mL Mouth Rinse BID   metoprolol tartrate  12.5 mg Oral BID   pantoprazole  40 mg Oral QHS   rosuvastatin  5 mg Oral Daily   triamcinolone cream   Topical BID   umeclidinium-vilanterol  1 puff Inhalation Daily   Continuous Infusions:  sodium chloride Stopped (08/16/21 0004)   ampicillin (OMNIPEN) IV 2 g (08/20/21 0915)   cefTRIAXone (ROCEPHIN)  IV 2 g (08/20/21 0422)   heparin 1,600 Units/hr (08/20/21 0420)     LOS: 5 days    Time spent: 35 mins.More than 50% of that time was spent in counseling and/or coordination of care.      Shelly Coss, MD [facility] P1/06/2022, 10:36 AM

## 2021-08-20 NOTE — H&P (View-Only) (Signed)
Advanced Heart Failure Rounding Note  PCP-Cardiologist: Dr. Aundra Dubin   Subjective:    Respiratory status improving, down from 8>>6L  (baseline 4).   Good diuresis w/ IV Lasix. 2.7L in UOP. Doubt today's wt is accurate (moved to different unit), Still w/ mild fluid overload on exam. SCr stable 1.2. SBPs 140s. NSR w/ PACs on tele.   On Ceftriaxone and ampicillin per ID. AF. WBC normal.    Repeat BCx from 1/16 NGTD   Feeling better today. No complaints. Wife present at bedside.     Objective:   Weight Range: 92.2 kg Body mass index is 26.81 kg/m.   Vital Signs:   Temp:  [97.5 F (36.4 C)-98.5 F (36.9 C)] 97.5 F (36.4 C) (01/11 0754) Pulse Rate:  [64-97] 67 (01/11 0754) Resp:  [17-24] 20 (01/11 0754) BP: (101-152)/(56-93) 134/69 (01/11 0754) SpO2:  [95 %-98 %] 96 % (01/11 0754) Weight:  [92.2 kg] 92.2 kg (01/11 0400) Last BM Date: 08/18/21  Weight change: Filed Weights   08/18/21 0351 08/19/21 0500 08/20/21 0400  Weight: 88.3 kg 88.7 kg 92.2 kg    Intake/Output:   Intake/Output Summary (Last 24 hours) at 08/20/2021 0923 Last data filed at 08/20/2021 0434 Gross per 24 hour  Intake 240 ml  Output 2700 ml  Net -2460 ml      Physical Exam     General:  Well appearing. No respiratory difficulty HEENT: normal Neck: supple. JVD 8-9 cm. Carotids 2+ bilat; no bruits. No lymphadenopathy or thyromegaly appreciated. Cor: PMI nondisplaced. Regular rate & rhythm. No rubs, gallops or murmurs. Lungs: clear Abdomen: soft, nontender, nondistended. No hepatosplenomegaly. No bruits or masses. Good bowel sounds. Extremities: no cyanosis, clubbing, rash, edema Neuro: alert & oriented x 3, cranial nerves grossly intact. moves all 4 extremities w/o difficulty. Affect pleasant.   Telemetry   SR w/ frequent PACS  EKG    No new EKG to review   Labs    CBC Recent Labs    08/19/21 0246 08/20/21 0220  WBC 8.5 9.4  HGB 10.8* 10.9*  HCT 34.6* 35.3*  MCV 84.4 85.1   PLT 219 250   Basic Metabolic Panel Recent Labs    08/17/21 1031 08/18/21 0029 08/19/21 0246 08/20/21 0220  NA 134*   < > 135 133*  K 3.4*   < > 3.9 4.0  CL 100   < > 101 99  CO2 25   < > 27 25  GLUCOSE 132*   < > 99 92  BUN 24*   < > 12 13  CREATININE 1.19   < > 1.19 1.23  CALCIUM 8.2*   < > 7.9* 8.1*  MG 2.1  --  2.0  --    < > = values in this interval not displayed.   Liver Function Tests No results for input(s): AST, ALT, ALKPHOS, BILITOT, PROT, ALBUMIN in the last 72 hours. No results for input(s): LIPASE, AMYLASE in the last 72 hours. Cardiac Enzymes No results for input(s): CKTOTAL, CKMB, CKMBINDEX, TROPONINI in the last 72 hours.  BNP: BNP (last 3 results) Recent Labs    01/29/21 1520 04/09/21 1123 08/16/21 0149  BNP 170.9* 147.0* 303.9*    ProBNP (last 3 results) No results for input(s): PROBNP in the last 8760 hours.   D-Dimer No results for input(s): DDIMER in the last 72 hours. Hemoglobin A1C No results for input(s): HGBA1C in the last 72 hours. Fasting Lipid Panel No results for input(s): CHOL, HDL, LDLCALC, TRIG,  CHOLHDL, LDLDIRECT in the last 72 hours. Thyroid Function Tests No results for input(s): TSH, T4TOTAL, T3FREE, THYROIDAB in the last 72 hours.  Invalid input(s): FREET3  Other results:   Imaging    No results found.   Medications:     Scheduled Medications:  ALPRAZolam  1 mg Oral QHS   Chlorhexidine Gluconate Cloth  6 each Topical Daily   escitalopram  10 mg Oral Daily   flecainide  75 mg Oral BID   furosemide  40 mg Intravenous BID   insulin aspart  0-15 Units Subcutaneous Q4H   mouth rinse  15 mL Mouth Rinse BID   metoprolol tartrate  12.5 mg Oral BID   pantoprazole  40 mg Oral QHS   rosuvastatin  5 mg Oral Daily   triamcinolone cream   Topical BID   umeclidinium-vilanterol  1 puff Inhalation Daily    Infusions:  sodium chloride Stopped (08/16/21 0004)   ampicillin (OMNIPEN) IV 2 g (08/20/21 0915)    cefTRIAXone (ROCEPHIN)  IV 2 g (08/20/21 0422)   heparin 1,600 Units/hr (08/20/21 0420)    PRN Medications: albuterol, bisacodyl, docusate sodium, fluticasone, magnesium hydroxide, ondansetron (ZOFRAN) IV, polyethylene glycol, polyvinyl alcohol    Patient Profile   71 y/o male w/ chronic diastolic heart failure, Type A aortic dissection, 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 Blood Cx + for enterococcus faecalis  Assessment/Plan   1. 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 Source uncertain. ? LE cellulitis. CT of A/P w/ no acute findings.  UA/UCx negative. CXR ? LLL pneumonia. Initial lactate 1.3. PCT 0.78>>0.51. WBC 11>>8K. Fevers resolved. Treating presumptively for endocarditis. Has bioprosthetic AoV. TTE w/ mod TR, mild MR. Prosthetic AoV not well visualized due to  poor acoustic windows.  - ID now following, on ceftriaxone and ampicillin - Plan TEE tomorrow   2. Acute on Chronic Hypoxic Respiratory Failure: multifactorial. Moderate COPD on home O2 (4L/min baseline) + chronic diastolic heart failure. Increased O2 requirements this admit, currently on 8L HFNC, in setting of sepsis s/p IVF resuscitation and ? LLL PNA on CXR. Suspect mild fluid overload post IVF resuscitation - Improving w/ diuresis, still fluid overloaded. Continue IV Lasix today, PO tomorrow  - continue abx per ID  - wean [place] as tolerated  3. Acute on Chronic Diastolic Heart Failure: Echo in 2/20 with EF 55%, mildly decreased RV systolic function, normally functioning bioprosthetic aortic valve. RHC in 11/20 after increasing torsemide showed near-normal filling pressures.  PYP study was not suggestive of ATTR amyloidosis and myeloma panel was negative. Echo in 1/22 showed EF 60-65%, moderate RV enlargement with mildly decreased RV systolic function, PASP 44,  bioprosthetic AVR with mean gradient 18 mmHg. Echo this admit, EF 55-60% RV moderately enlarged w/ moderately elevated RVSP 55.4 mmHg, RA severely dilated, mod TR, mild MR. Prosthetic AoV not well visualized, poor acoustic windows. Peak and mean gradients through the valve are 5 and 2 mm Hg respectively. Previous echo in May 2022, peak and mean gradients were 34 and 18 mm Hg respectively. Diuretics held on admit due to AKI, hypotension and sepsis. Now s/p IVF resuscitation. AKI resolved and now w/ mild fluid overload on exam.  - diurese w/ IV Lasix, 40 mg bid  - continue metoprolol 12.5 mg bid  4. AKI: admit SCr 1.9 (baseline ~1.4). In setting of sepsis/ hypotension. Now resolved w/ treatment of sepsis/ IVFs. SCr 1.2 today  -  follow BMP   5. 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 Roxy Manns. Echo 1/22 showed stable bioprosthetic aortic valve.  Dr. Trula Slade has been following his residual dissected aorta.  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 6. Atrial fibrillation: Paroxysmal.  S/p DCCV in 11/21.  Maintaining NSR currently w/ frequent PACs. HR controlled.     - Continue flecainide + metoprolol.  - Eliquis 5 mg bid PTA. On heparin gtt for now  7. COPD: Moderate to severe obstruction on 6/19 PFTs, prior smoker.  He is on home oxygen. Followed by pulmonology   4. OSA: CPAP QHS.    Length of Stay: [address] PA-C  08/20/2021, 9:23 AM  Advanced Heart Failure Team Pager [phone] (M-F; 7a - 5p)  Please contact [facility] for night-coverage after hours (5p -7a ) and weekends on [email or web address]   Patient seen with PA, agree with the above note.   Feeling better today, down to 6L O2 by [place] (baseline 4L).  Diuresed well overnight.   General: NAD Neck: JVP 8-9 cm, no thyromegaly or thyroid nodule.  Lungs: Clear to auscultation bilaterally with normal respiratory effort. CV: Nondisplaced  PMI.  Heart regular S1/S2, no S3/S4, no murmur.  Trace ankle edema.   Abdomen: Soft, nontender, no hepatosplenomegaly, no distention.  Skin: Intact without lesions or rashes.  Neurologic: Alert and oriented x 3.  Psych: Normal affect. Extremities: No clubbing or cyanosis.  HEENT: Normal.   Continue IV Lasix today, likely back to po tomorrow.   Think he is ready for TEE, unable to get scheduled today, will aim for tomorrow. Continue ceftriaxone/ampicillin for E faecalis bacteremia.   [name] 08/20/2021 1:45 PM

## 2021-08-20 NOTE — Progress Notes (Addendum)
Advanced Heart Failure Rounding Note  PCP-Cardiologist: Dr. Aundra Dubin   Subjective:    Respiratory status improving, down from 8>>6L [place] (baseline 4).   Good diuresis w/ IV Lasix. 2.7L in UOP. Doubt today's wt is accurate (moved to different unit), Still w/ mild fluid overload on exam. SCr stable 1.2. SBPs 140s. NSR w/ PACs on tele.   On Ceftriaxone and ampicillin per ID. AF. WBC normal.    Repeat BCx from 1/16 NGTD   Feeling better today. No complaints. Wife present at bedside.     Objective:   Weight Range: 92.2 kg Body mass index is 26.81 kg/m.   Vital Signs:   Temp:  [97.5 F (36.4 C)-98.5 F (36.9 C)] 97.5 F (36.4 C) (01/11 0754) Pulse Rate:  [64-97] 67 (01/11 0754) Resp:  [17-24] 20 (01/11 0754) BP: (101-152)/(56-93) 134/69 (01/11 0754) SpO2:  [95 %-98 %] 96 % (01/11 0754) Weight:  [92.2 kg] 92.2 kg (01/11 0400) Last BM Date: 08/18/21  Weight change: Filed Weights   08/18/21 0351 08/19/21 0500 08/20/21 0400  Weight: 88.3 kg 88.7 kg 92.2 kg    Intake/Output:   Intake/Output Summary (Last 24 hours) at 08/20/2021 0923 Last data filed at 08/20/2021 0434 Gross per 24 hour  Intake 240 ml  Output 2700 ml  Net -2460 ml      Physical Exam     General:  Well appearing. No respiratory difficulty HEENT: normal Neck: supple. JVD 8-9 cm. Carotids 2+ bilat; no bruits. No lymphadenopathy or thyromegaly appreciated. Cor: PMI nondisplaced. Regular rate & rhythm. No rubs, gallops or murmurs. Lungs: clear Abdomen: soft, nontender, nondistended. No hepatosplenomegaly. No bruits or masses. Good bowel sounds. Extremities: no cyanosis, clubbing, rash, edema Neuro: alert & oriented x 3, cranial nerves grossly intact. moves all 4 extremities w/o difficulty. Affect pleasant.   Telemetry   SR w/ frequent PACS  EKG    No new EKG to review   Labs    CBC Recent Labs    08/19/21 0246 08/20/21 0220  WBC 8.5 9.4  HGB 10.8* 10.9*  HCT 34.6* 35.3*  MCV 84.4 85.1   PLT 219 767   Basic Metabolic Panel Recent Labs    08/17/21 1031 08/18/21 0029 08/19/21 0246 08/20/21 0220  NA 134*   < > 135 133*  K 3.4*   < > 3.9 4.0  CL 100   < > 101 99  CO2 25   < > 27 25  GLUCOSE 132*   < > 99 92  BUN 24*   < > 12 13  CREATININE 1.19   < > 1.19 1.23  CALCIUM 8.2*   < > 7.9* 8.1*  MG 2.1  --  2.0  --    < > = values in this interval not displayed.   Liver Function Tests No results for input(s): AST, ALT, ALKPHOS, BILITOT, PROT, ALBUMIN in the last 72 hours. No results for input(s): LIPASE, AMYLASE in the last 72 hours. Cardiac Enzymes No results for input(s): CKTOTAL, CKMB, CKMBINDEX, TROPONINI in the last 72 hours.  BNP: BNP (last 3 results) Recent Labs    01/29/21 1520 04/09/21 1123 08/16/21 0149  BNP 170.9* 147.0* 303.9*    ProBNP (last 3 results) No results for input(s): PROBNP in the last 8760 hours.   D-Dimer No results for input(s): DDIMER in the last 72 hours. Hemoglobin A1C No results for input(s): HGBA1C in the last 72 hours. Fasting Lipid Panel No results for input(s): CHOL, HDL, LDLCALC, TRIG,  CHOLHDL, LDLDIRECT in the last 72 hours. Thyroid Function Tests No results for input(s): TSH, T4TOTAL, T3FREE, THYROIDAB in the last 72 hours.  Invalid input(s): FREET3  Other results:   Imaging    No results found.   Medications:     Scheduled Medications:  ALPRAZolam  1 mg Oral QHS   Chlorhexidine Gluconate Cloth  6 each Topical Daily   escitalopram  10 mg Oral Daily   flecainide  75 mg Oral BID   furosemide  40 mg Intravenous BID   insulin aspart  0-15 Units Subcutaneous Q4H   mouth rinse  15 mL Mouth Rinse BID   metoprolol tartrate  12.5 mg Oral BID   pantoprazole  40 mg Oral QHS   rosuvastatin  5 mg Oral Daily   triamcinolone cream   Topical BID   umeclidinium-vilanterol  1 puff Inhalation Daily    Infusions:  sodium chloride Stopped (08/16/21 0004)   ampicillin (OMNIPEN) IV 2 g (08/20/21 0915)    cefTRIAXone (ROCEPHIN)  IV 2 g (08/20/21 0422)   heparin 1,600 Units/hr (08/20/21 0420)    PRN Medications: albuterol, bisacodyl, docusate sodium, fluticasone, magnesium hydroxide, ondansetron (ZOFRAN) IV, polyethylene glycol, polyvinyl alcohol    Patient Profile   71 y/o male w/ chronic diastolic heart failure, Type A aortic dissection, 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 Blood Cx + for enterococcus faecalis  Assessment/Plan   1. 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 Source uncertain. ? LE cellulitis. CT of A/P w/ no acute findings.  UA/UCx negative. CXR ? LLL pneumonia. Initial lactate 1.3. PCT 0.78>>0.51. WBC 11>>8K. Fevers resolved. Treating presumptively for endocarditis. Has bioprosthetic AoV. TTE w/ mod TR, mild MR. Prosthetic AoV not well visualized due to  poor acoustic windows.  - ID now following, on ceftriaxone and ampicillin - Plan TEE tomorrow   2. Acute on Chronic Hypoxic Respiratory Failure: multifactorial. Moderate COPD on home O2 (4L/min baseline) + chronic diastolic heart failure. Increased O2 requirements this admit, currently on 8L HFNC, in setting of sepsis s/p IVF resuscitation and ? LLL PNA on CXR. Suspect mild fluid overload post IVF resuscitation - Improving w/ diuresis, still fluid overloaded. Continue IV Lasix today, PO tomorrow  - continue abx per ID  - wean [place] as tolerated  3. Acute on Chronic Diastolic Heart Failure: Echo in 2/20 with EF 55%, mildly decreased RV systolic function, normally functioning bioprosthetic aortic valve. RHC in 11/20 after increasing torsemide showed near-normal filling pressures.  PYP study was not suggestive of ATTR amyloidosis and myeloma panel was negative. Echo in 1/22 showed EF 60-65%, moderate RV enlargement with mildly decreased RV systolic function, PASP 44,  bioprosthetic AVR with mean gradient 18 mmHg. Echo this admit, EF 55-60% RV moderately enlarged w/ moderately elevated RVSP 55.4 mmHg, RA severely dilated, mod TR, mild MR. Prosthetic AoV not well visualized, poor acoustic windows. Peak and mean gradients through the valve are 5 and 2 mm Hg respectively. Previous echo in May 2022, peak and mean gradients were 34 and 18 mm Hg respectively. Diuretics held on admit due to AKI, hypotension and sepsis. Now s/p IVF resuscitation. AKI resolved and now w/ mild fluid overload on exam.  - diurese w/ IV Lasix, 40 mg bid  - continue metoprolol 12.5 mg bid  4. AKI: admit SCr 1.9 (baseline ~1.4). In setting of sepsis/ hypotension. Now resolved w/ treatment of sepsis/ IVFs. SCr 1.2 today  -  follow BMP   5. 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 Roxy Manns. Echo 1/22 showed stable bioprosthetic aortic valve.  Dr. Trula Slade has been following his residual dissected aorta.  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 6. Atrial fibrillation: Paroxysmal.  S/p DCCV in 11/21.  Maintaining NSR currently w/ frequent PACs. HR controlled.     - Continue flecainide + metoprolol.  - Eliquis 5 mg bid PTA. On heparin gtt for now  7. COPD: Moderate to severe obstruction on 6/19 PFTs, prior smoker.  He is on home oxygen. Followed by pulmonology   4. OSA: CPAP QHS.    Length of Stay: [address], PA-C  08/20/2021, 9:23 AM  Advanced Heart Failure Team Pager [phone] (M-F; 7a - 5p)  Please contact [facility] for night-coverage after hours (5p -7a ) and weekends on [email or web address]   Patient seen with PA, agree with the above note.   Feeling better today, down to 6L O2 by [place] (baseline 4L).  Diuresed well overnight.   General: NAD Neck: JVP 8-9 cm, no thyromegaly or thyroid nodule.  Lungs: Clear to auscultation bilaterally with normal respiratory effort. CV: Nondisplaced  PMI.  Heart regular S1/S2, no S3/S4, no murmur.  Trace ankle edema.   Abdomen: Soft, nontender, no hepatosplenomegaly, no distention.  Skin: Intact without lesions or rashes.  Neurologic: Alert and oriented x 3.  Psych: Normal affect. Extremities: No clubbing or cyanosis.  HEENT: Normal.   Continue IV Lasix today, likely back to po tomorrow.   Think he is ready for TEE, unable to get scheduled today, will aim for tomorrow. Continue ceftriaxone/ampicillin for E faecalis bacteremia.   [name] 08/20/2021 1:45 PM

## 2021-08-20 NOTE — Progress Notes (Signed)
ANTICOAGULATION CONSULT NOTE - Follow Up Consult  Pharmacy Consult for heparin Indication: atrial fibrillation  Labs: Recent Labs    08/17/21 1325 08/17/21 1325 08/18/21 0029 08/18/21 2018 08/19/21 0246 08/20/21 0220  HGB  --    < > 10.9*  --  10.8* 10.9*  HCT  --   --  33.8*  --  34.6* 35.3*  PLT  --   --  180  --  219 265  APTT 74*  --  73*  --   --   --   HEPARINUNFRC 0.62  --  0.48 0.41 0.38 0.21*  CREATININE  --   --  1.05  --  1.19 1.23   < > = values in this interval not displayed.    Assessment: 71yo male now subtherapeutic on heparin after several levels at goal though likely 2/2 Eliquis clearing; no infusion issues or signs of bleeding per RN.  Goal of Therapy:  Heparin level 0.3-0.7 units/ml   Plan:  Will increase heparin infusion by ~2 units/kg/hr to 1600 units/hr and check level in 6 hours.    Wynona Neat, PharmD, BCPS  08/20/2021,4:06 AM

## 2021-08-20 NOTE — Progress Notes (Signed)
ANTICOAGULATION CONSULT NOTE - Follow Up Consult  Pharmacy Consult for heparin Indication: atrial fibrillation  Labs: Recent Labs    08/17/21 1325 08/17/21 1325 08/18/21 0029 08/18/21 2018 08/19/21 0246 08/20/21 0220 08/20/21 1027  HGB  --    < > 10.9*  --  10.8* 10.9*  --   HCT  --   --  33.8*  --  34.6* 35.3*  --   PLT  --   --  180  --  219 265  --   APTT 74*  --  73*  --   --   --   --   HEPARINUNFRC 0.62  --  0.48   < > 0.38 0.21* 0.47  CREATININE  --   --  1.05  --  1.19 1.23  --    < > = values in this interval not displayed.     Assessment: 71yo male now therapeutic on heparin for Afib while Eliquis on hold  Goal of Therapy:  Heparin level 0.3-0.7 units/ml   Plan:  Continue heparin at current rate Follow up AM labs Follow up for Eliquis resumption?  Thank you Anette Guarneri, PharmD 08/20/2021,11:19 AM

## 2021-08-20 NOTE — Evaluation (Signed)
Occupational Therapy Evaluation Patient Details Name: CAI ANFINSON MRN: [id number] DOB: 07-01-1951 Today's Date: 08/20/2021   History of Present Illness Pt is a 71 yo male admitted with confusion, ocugh and SOB, hypoxia requiring Bipap.  Pt found to have enterococcus bacteremia, endocarditis and diastolic heart failure.  Pt with PMH of COPD on home O2, afib, CHF, AAA repair and HTN (Simultaneous filing. User may not have seen previous data.)   Clinical Impression   Pt admitted with the above diagnosis and has the deficits listed below. Pt would benefit from cont OT to increase independence with basic adls and adl transfers so pt is safe returning home with his wife. Pt with O2 sats dropping at this time when up on his feet and mild cognitive deficits noted. Pt will need wife to be present at most times when medically ready to d/c. Will continue to follow for tasks in standing and activity tolerance tasks.       Recommendations for follow up therapy are one component of a multi-disciplinary discharge planning process, led by the attending physician.  Recommendations may be updated based on patient status, additional functional criteria and insurance authorization.   Follow Up Recommendations  Home health OT    Assistance Recommended at Discharge Frequent or constant Supervision/Assistance  Patient can return home with the following A little help with bathing/dressing/bathroom;Assistance with cooking/housework;Help with stairs or ramp for entrance    Functional Status Assessment  Patient has had a recent decline in their functional status and demonstrates the ability to make significant improvements in function in a reasonable and predictable amount of time.  Equipment Recommendations  None recommended by OT    Recommendations for Other Services       Precautions / Restrictions Precautions Precautions: Fall;Other (comment) (check O2  Simultaneous filing. User may not have seen  previous data.) Precaution Comments: check O2 levels (Simultaneous filing. User may not have seen previous data.) Restrictions Weight Bearing Restrictions: No (Simultaneous filing. User may not have seen previous data.)      Mobility Bed Mobility Overal bed mobility: Needs Assistance Bed Mobility: Supine to Sit     Supine to sit: Min assist;HOB elevated     General bed mobility comments: pt stating (Simultaneous filing. User may not have seen previous data.)    Transfers Overall transfer level: Needs assistance Equipment used: Rolling walker (2 wheels) Transfers: Sit to/from Stand;Bed to chair/wheelchair/BSC Sit to Stand: Min guard Stand pivot transfers: Min assist         General transfer comment: cues for hand placement and pursed lip breathing.      Balance Overall balance assessment: Needs assistance Sitting-balance support: Feet supported Sitting balance-[name]: Good     Standing balance support: Bilateral upper extremity supported;During functional activity;Reliant on assistive device for balance Standing balance-[name]: Poor Standing balance comment: reliant on walker at sink during grooming                           ADL either performed or assessed with clinical judgement   ADL Overall ADL's : Needs assistance/impaired Eating/Feeding: Set up;Sitting   Grooming: Wash/dry hands;Wash/dry face;Oral care;Applying deodorant;Set up;Sitting Grooming Details (indicate cue type and reason): Pt stood at sink for appx 30 sec to groom but O2 sats dropped into mid 80s so pt returned to sitting to complete grooming Upper Body Bathing: Set up;Sitting   Lower Body Bathing: Moderate assistance;Sit to/from stand;Cueing for compensatory techniques Lower Body Bathing Details (  indicate cue type and reason): when given extra time, pt can complete. Pt fatigues quickly. Upper Body Dressing : Set up;Sitting   Lower Body Dressing: Minimal assistance;Sit  to/from stand;Cueing for compensatory techniques Lower Body Dressing Details (indicate cue type and reason): extra time given and assist with socks in chair. Toilet Transfer: Minimal assistance;BSC/3in1;Stand-pivot   Toileting- Clothing Manipulation and Hygiene: Minimal assistance;Sit to/from stand       Functional mobility during ADLs: Minimal assistance;Rolling walker (2 wheels);Cueing for sequencing General ADL Comments: Pt most limited by activity tolerance and O2 sats dropping. Pt required min cues for sequencing as pt would get confused from time to time about where moving in the room.     Vision Baseline Vision/History: 1 Wears glasses Ability to See in Adequate Light: 0 Adequate Patient Visual Report: No change from baseline Vision Assessment?: No apparent visual deficits     Perception     Praxis      Pertinent Vitals/Pain Pain Assessment: No/denies pain (Simultaneous filing. User may not have seen previous data.) Faces Pain Scale: Hurts a little bit Pain Location: back Pain Descriptors / Indicators: Aching Pain Intervention(s): Monitored during session;Repositioned     Hand Dominance Right (Simultaneous filing. User may not have seen previous data.)   Extremity/Trunk Assessment Upper Extremity Assessment Upper Extremity Assessment: Defer to OT evaluation (Simultaneous filing. User may not have seen previous data.)   Lower Extremity Assessment Lower Extremity Assessment: Generalized weakness (Simultaneous filing. User may not have seen previous data.)   Cervical / Trunk Assessment Cervical / Trunk Assessment: Normal   Communication Communication Communication: HOH (Simultaneous filing. User may not have seen previous data.)   Cognition Arousal/Alertness: Awake/alert (Simultaneous filing. User may not have seen previous data.) Behavior During Therapy: [facility] for tasks assessed/performed (Simultaneous filing. User may not have seen previous data.) Overall Cognitive  Status: Within Functional Limits for tasks assessed (Simultaneous filing. User may not have seen previous data.) Area of Impairment: Orientation;Problem solving                 Orientation Level: Time;Disoriented to           Problem Solving: Slow processing;Decreased initiation General Comments: mild delay in response time however pt very HOH (Simultaneous filing. User may not have seen previous data.)     General Comments  pt with swelling in BLEs. Encouaged pt to sit with feet up.  Pt with O2 sats dropping during most activity.  Educated on pursed lip breathing.  Pt's cognitive status appears mildly limited at this point but improving.    Exercises     Shoulder Instructions      Home Living Family/patient expects to be discharged to:: Private residence (Simultaneous filing. User may not have seen previous data.) Living Arrangements: Spouse/significant other Available Help at Discharge: Family;Available 24 hours/day Type of Home: House Home Access: Stairs to enter [employer] of Steps: 5 Entrance Stairs-Rails: Right;Left Home Layout: One level     Bathroom Shower/Tub: Occupational psychologist: Handicapped height     Home Equipment: Conservation officer, nature (2 wheels);Cane - single point;Rollator (4 wheels);Shower seat;BSC/3in1   Additional Comments: Pt sleep in his lift recliner.  Also has an adjustable bed      Prior Functioning/Environment Prior Level of Function : Needs assist (Simultaneous filing. User may not have seen previous data.)       Physical Assist : ADLs (physical);Mobility (physical) Mobility (physical): Gait (uses cane outside of home) ADLs (physical): Dressing (some assist with LE  dressing when fatigued) Mobility Comments: uses cane outside of home (Simultaneous filing. User may not have seen previous data.) ADLs Comments: sometimes needs assist with LE adls when fatigued but can do them alone if needed (Simultaneous filing. User  may not have seen previous data.)        OT Problem List: Decreased activity tolerance;Impaired balance (sitting and/or standing);Decreased cognition;Decreased safety awareness;Decreased knowledge of use of DME or AE;Pain;Increased edema      OT Treatment/Interventions: Self-care/ADL training;DME and/or AE instruction;Therapeutic activities    OT Goals(Current goals can be found in the care plan section) Acute Rehab OT Goals Patient Stated Goal: to go home OT Goal Formulation: With patient Time For Goal Achievement: 09/03/21 Potential to Achieve Goals: Good ADL Goals Pt Will Perform Lower Body Bathing: with supervision;sit to/from stand Pt Will Perform Lower Body Dressing: sit to/from stand;with supervision Pt Will Transfer to Toilet: with supervision;ambulating Pt Will Perform Tub/Shower Transfer: Shower transfer;with supervision;ambulating;rolling walker;shower seat Additional ADL Goal #1: pt wil state two things he can do at home to conserve energy during adls with no cues.  OT Frequency: Min 2X/week    Co-evaluation              AM-PAC OT "6 Clicks" Daily Activity     Outcome Measure Help from another person eating meals?: None Help from another person taking care of personal grooming?: None Help from another person toileting, which includes using toliet, bedpan, or urinal?: A Little Help from another person bathing (including washing, rinsing, drying)?: A Little Help from another person to put on and taking off regular upper body clothing?: A Little Help from another person to put on and taking off regular lower body clothing?: A Lot 6 Click Score: 19   End of Session Equipment Utilized During Treatment: Rolling walker (2 wheels);Oxygen Nurse Communication: Mobility status  Activity Tolerance: Patient limited by fatigue Patient left: in chair;with call bell/phone within reach;with chair alarm set  OT Visit Diagnosis: Unsteadiness on feet (R26.81);Other abnormalities  of gait and mobility (R26.89)                Time: 0034-9179 OT Time Calculation (min): 27 min Charges:  OT General Charges $OT Visit: 1 Visit OT Evaluation $OT Eval Moderate Complexity: 1 Mod OT Treatments $Self Care/Home Management : 8-22 mins  Glenford Peers 08/20/2021, 11:42 AM

## 2021-08-21 ENCOUNTER — Inpatient Hospital Stay (HOSPITAL_COMMUNITY): Payer: Medicare Other | Admitting: Certified Registered Nurse Anesthetist

## 2021-08-21 ENCOUNTER — Inpatient Hospital Stay (HOSPITAL_COMMUNITY): Payer: Medicare Other

## 2021-08-21 ENCOUNTER — Encounter (HOSPITAL_COMMUNITY): Payer: Self-pay | Admitting: Pulmonary Disease

## 2021-08-21 ENCOUNTER — Encounter (HOSPITAL_COMMUNITY)
Admission: EM | Disposition: A | Discharge status: Home / Self Care | Payer: Self-pay | Source: Home / Self Care | Attending: Internal Medicine

## 2021-08-21 DIAGNOSIS — I361 Nonrheumatic tricuspid (valve) insufficiency: Secondary | ICD-10-CM

## 2021-08-21 DIAGNOSIS — I5033 Acute on chronic diastolic (congestive) heart failure: Secondary | ICD-10-CM | POA: Diagnosis not present

## 2021-08-21 DIAGNOSIS — R7881 Bacteremia: Secondary | ICD-10-CM | POA: Diagnosis not present

## 2021-08-21 DIAGNOSIS — I340 Nonrheumatic mitral (valve) insufficiency: Secondary | ICD-10-CM | POA: Diagnosis not present

## 2021-08-21 DIAGNOSIS — B952 Enterococcus as the cause of diseases classified elsewhere: Secondary | ICD-10-CM | POA: Diagnosis not present

## 2021-08-21 HISTORY — PX: TEE WITHOUT CARDIOVERSION: SHX5443

## 2021-08-21 LAB — GLUCOSE, CAPILLARY
Glucose-Capillary: 107 mg/dL — ABNORMAL HIGH (ref 70–99)
Glucose-Capillary: 116 mg/dL — ABNORMAL HIGH (ref 70–99)
Glucose-Capillary: 77 mg/dL (ref 70–99)
Glucose-Capillary: 78 mg/dL (ref 70–99)
Glucose-Capillary: 96 mg/dL (ref 70–99)
Glucose-Capillary: 97 mg/dL (ref 70–99)

## 2021-08-21 LAB — CULTURE, BLOOD (ROUTINE X 2)
Culture: NO GROWTH
Culture: NO GROWTH

## 2021-08-21 LAB — BASIC METABOLIC PANEL
Anion gap: 9 (ref 5–15)
BUN: 14 mg/dL (ref 8–23)
CO2: 29 mmol/L (ref 22–32)
Calcium: 8.2 mg/dL — ABNORMAL LOW (ref 8.9–10.3)
Chloride: 97 mmol/L — ABNORMAL LOW (ref 98–111)
Creatinine, Ser: 1.2 mg/dL (ref 0.61–1.24)
GFR, Estimated: >60 mL/min (ref >60)
Glucose, Bld: 90 mg/dL (ref 70–99)
Potassium: 3.7 mmol/L (ref 3.5–5.1)
Sodium: 135 mmol/L (ref 135–145)

## 2021-08-21 LAB — CBC
HCT: 35 % — ABNORMAL LOW (ref 39.0–52.0)
Hemoglobin: 10.7 g/dL — ABNORMAL LOW (ref 13.0–17.0)
MCH: 26 pg (ref 26.0–34.0)
MCHC: 30.6 g/dL (ref 30.0–36.0)
MCV: 85.2 fL (ref 80.0–100.0)
Platelets: 291 10*3/uL (ref 150–400)
RBC: 4.11 MIL/uL — ABNORMAL LOW (ref 4.22–5.81)
RDW: 16.7 % — ABNORMAL HIGH (ref 11.5–15.5)
WBC: 10.2 10*3/uL (ref 4.0–10.5)
nRBC: 0 % (ref 0.0–0.2)

## 2021-08-21 LAB — HEPARIN LEVEL (UNFRACTIONATED): Heparin Unfractionated: 0.51 IU/mL (ref 0.30–0.70)

## 2021-08-21 SURGERY — ECHOCARDIOGRAM, TRANSESOPHAGEAL
Anesthesia: Monitor Anesthesia Care

## 2021-08-21 MED ORDER — TORSEMIDE 20 MG PO TABS
60.0000 mg | ORAL_TABLET | Freq: Every day | ORAL | Status: DC
  Administered 2021-08-21 – 2021-08-22 (×2): 60 mg via ORAL
  Filled 2021-08-21 (×2): qty 3

## 2021-08-21 MED ORDER — PROPOFOL 500 MG/50ML IV EMUL
INTRAVENOUS | Status: DC | PRN
  Administered 2021-08-21: 100 ug/kg/min via INTRAVENOUS

## 2021-08-21 MED ORDER — SODIUM CHLORIDE 0.9 % IV SOLN: INTRAVENOUS | Status: DC

## 2021-08-21 MED ORDER — POTASSIUM CHLORIDE CRYS ER 20 MEQ PO TBCR
40.0000 meq | EXTENDED_RELEASE_TABLET | Freq: Once | ORAL | Status: AC
  Administered 2021-08-21: 40 meq via ORAL
  Filled 2021-08-21: qty 2

## 2021-08-21 MED ORDER — LIDOCAINE 2% (20 MG/ML) 5 ML SYRINGE
INTRAMUSCULAR | Status: DC | PRN
  Administered 2021-08-21: 40 mg via INTRAVENOUS

## 2021-08-21 MED ORDER — PROPOFOL 10 MG/ML IV BOLUS
INTRAVENOUS | Status: DC | PRN
  Administered 2021-08-21: 30 mg via INTRAVENOUS

## 2021-08-21 NOTE — Progress Notes (Signed)
No bipap needed at this time. Pt resting comfortably on 5L [place].

## 2021-08-21 NOTE — Interval H&P Note (Signed)
History and Physical Interval Note:  08/21/2021 1:47 PM  Sean Riley  has presented today for surgery, with the diagnosis of bacteremia.  The various methods of treatment have been discussed with the patient and family. After consideration of risks, benefits and other options for treatment, the patient has consented to  Procedure(s): TRANSESOPHAGEAL ECHOCARDIOGRAM (TEE) (N/A) as a surgical intervention.  The patient's history has been reviewed, patient examined, no change in status, stable for surgery.  I have reviewed the patient's chart and labs.  Questions were answered to the patient's satisfaction.     Lynnelle Mesmer [employer]

## 2021-08-21 NOTE — Anesthesia Preprocedure Evaluation (Signed)
Anesthesia Evaluation  Patient identified by MRN, date of birth, ID band Patient awake    Reviewed: Allergy & Precautions, NPO status , Patient's Chart, lab work & pertinent test results, reviewed documented beta blocker date and time   Airway Mallampati: II  TM Distance: >3 FB Neck ROM: Full    Dental  (+) Teeth Intact, Dental Advisory Given   Pulmonary COPD, former smoker,    Pulmonary exam normal breath sounds clear to auscultation       Cardiovascular hypertension, Pt. on home beta blockers and Pt. on medications + Peripheral Vascular Disease (s/p [place], aortic dissection repair) and +CHF  + dysrhythmias  Rhythm:Regular Rate:Normal + Systolic murmurs    Neuro/Psych PSYCHIATRIC DISORDERS Depression negative neurological ROS     GI/Hepatic Neg liver ROS, GERD  Medicated,  Endo/Other  diabetes, Type 2, Oral Hypoglycemic Agents  Renal/GU Renal InsufficiencyRenal disease     Musculoskeletal  (+) Arthritis ,   Abdominal   Peds  Hematology  (+) Blood dyscrasia, anemia , Bacteremia    Anesthesia Other Findings Day of surgery medications reviewed with the patient.  Reproductive/Obstetrics                             Anesthesia Physical Anesthesia Plan  ASA: 3  Anesthesia Plan: MAC   Post-op Pain Management:    Induction: Intravenous  PONV Risk Score and Plan: 1 and Propofol infusion and Treatment may vary due to age or medical condition  Airway Management Planned: Natural Airway and Simple Face Mask  Additional Equipment:   Intra-op Plan:   Post-operative Plan:   Informed Consent: I have reviewed the patients History and Physical, chart, labs and discussed the procedure including the risks, benefits and alternatives for the proposed anesthesia with the patient or authorized representative who has indicated his/her understanding and acceptance.     Dental advisory  given  Plan Discussed with: CRNA  Anesthesia Plan Comments:         Anesthesia Quick Evaluation

## 2021-08-21 NOTE — Plan of Care (Signed)

## 2021-08-21 NOTE — Transfer of Care (Signed)
Immediate Anesthesia Transfer of Care Note  Patient: Sean Riley  Procedure(s) Performed: TRANSESOPHAGEAL ECHOCARDIOGRAM (TEE)  Patient Location: PACU  Anesthesia Type:MAC  Level of Consciousness: awake and alert   Airway & Oxygen Therapy: Patient Spontanous Breathing and Patient connected to nasal cannula oxygen  Post-op Assessment: Report given to RN and Post -op Vital signs reviewed and stable  Post vital signs: Reviewed and stable  Last Vitals:  Vitals Value Taken Time  BP 129/63 08/21/21 1424  Temp    Pulse 64 08/21/21 1427  Resp 21 08/21/21 1427  SpO2 99 % 08/21/21 1427  Vitals shown include unvalidated device data.  Last Pain:  Vitals:   08/21/21 1330  TempSrc: Oral  PainSc: 0-No pain         Complications: No notable events documented.

## 2021-08-21 NOTE — Progress Notes (Signed)
Physical Therapy Treatment Patient Details Name: Sean Riley MRN: [id number] DOB: April 02, 1951 Today's Date: 08/21/2021   History of Present Illness Pt is a 71 yo male admitted with confusion, ocugh and SOB, hypoxia requiring Bipap.  Pt found to have enterococcus bacteremia, endocarditis and diastolic heart failure.  Pt with PMH of COPD on home O2, afib, CHF, AAA repair and HTN    PT Comments    Patient remains on 6L [place] O2 with sats dropping to 84% during standing, marching. Unsafe to progress to ambulation due to multiple lines, monitor, etc, but was able to march in place up to 22 steps at a time. Required seated rest between bouts due to dyspnea and low sats. Noted wife wants to take pt home, however pt has 4 steps to enter house. Patient unlikely to be ready to discharge Friday and it's a long shot for Saturday based on his exercise tolerance today. Will continue to follow.    Recommendations for follow up therapy are one component of a multi-disciplinary discharge planning process, led by the attending physician.  Recommendations may be updated based on patient status, additional functional criteria and insurance authorization.  Follow Up Recommendations  Home health PT     Assistance Recommended at Discharge Frequent or constant Supervision/Assistance  Patient can return home with the following A little help with walking and/or transfers;A little help with bathing/dressing/bathroom;Assist for transportation;Help with stairs or ramp for entrance   Equipment Recommendations   (has DME at home)    Recommendations for Other Services       Precautions / Restrictions Precautions Precautions: Fall;Other (comment) Precaution Comments: check O2 levels Restrictions Weight Bearing Restrictions: No     Mobility  Bed Mobility Overal bed mobility: Needs Assistance Bed Mobility: Supine to Sit     Supine to sit: Min assist;HOB elevated     General bed mobility comments: up in  recliner    Transfers Overall transfer level: Needs assistance Equipment used: Rolling walker (2 wheels) Transfers: Sit to/from Stand Sit to Stand: Min assist;Min guard     Step pivot transfers: Min assist     General transfer comment: verbal cues for hand placement, increased time, use momentum to push self up with armrests; repeated x 4 with progression from min assist to minguard    Ambulation/Gait             Pre-gait activities: marching x 22 steps, seated rest, x 20 steps     Stairs             Wheelchair Mobility    Modified Rankin (Stroke Patients Only)       Balance Overall balance assessment: Needs assistance Sitting-balance support: Feet supported Sitting balance-[name]: Good     Standing balance support: Bilateral upper extremity supported;During functional activity;Reliant on assistive device for balance Standing balance-[name]: Poor Standing balance comment: reliant on walker                            Cognition Arousal/Alertness: Awake/alert Behavior During Therapy: WFL for tasks assessed/performed Overall Cognitive Status: Within Functional Limits for tasks assessed Area of Impairment: Problem solving                 Orientation Level: Time;Disoriented to           Problem Solving: Slow processing;Decreased initiation General Comments: mild delay in response time however pt very [facility]        Exercises General  Exercises - Lower Extremity Ankle Circles/Pumps: AROM;10 reps Long Arc Quad: AROM;Both;10 reps Hip Flexion/Marching: AROM;Both;10 reps;Standing (x 2 sets)    General Comments General comments (skin integrity, edema, etc.): on 6L with sats 84-93% with decreases during activity      Pertinent Vitals/Pain Pain Assessment: Faces Faces Pain Scale: Hurts a little bit Pain Location: back Pain Descriptors / Indicators: Aching Pain Intervention(s): Limited activity within patient's  tolerance;Monitored during session    Home Living                          Prior Function            PT Goals (current goals can now be found in the care plan section) Acute Rehab PT Goals Patient Stated Goal: home Time For Goal Achievement: 09/03/21 Potential to Achieve Goals: Good Progress towards PT goals: Progressing toward goals    Frequency    Min 4X/week      PT Plan Current plan remains appropriate (however may not be ready by Sat)    Co-evaluation              AM-PAC PT "6 Clicks" Mobility   Outcome Measure  Help needed turning from your back to your side while in a flat bed without using bedrails?: A Little Help needed moving from lying on your back to sitting on the side of a flat bed without using bedrails?: A Little Help needed moving to and from a bed to a chair (including a wheelchair)?: A Little Help needed standing up from a chair using your arms (e.g., wheelchair or bedside chair)?: A Little Help needed to walk in hospital room?: Total Help needed climbing 3-5 steps with a railing? : Total 6 Click Score: 14    End of Session Equipment Utilized During Treatment: Gait belt Activity Tolerance: Treatment limited secondary to medical complications (Comment) (limited by decr sats with activity) Patient left: in chair;with call bell/phone within reach;with chair alarm set Nurse Communication: Mobility status PT Visit Diagnosis: Unsteadiness on feet (R26.81);Muscle weakness (generalized) (M62.81);Difficulty in walking, not elsewhere classified (R26.2)     Time: 6568-1275 PT Time Calculation (min) (ACUTE ONLY): 25 min  Charges:  $Gait Training: 8-22 mins $Therapeutic Exercise: 8-22 mins                      Arby Barrette, PT Acute Rehabilitation Services  Pager [phone] Office [phone]    Sean Riley 08/21/2021, 9:29 AM

## 2021-08-21 NOTE — Progress Notes (Signed)
Patient transported to ENDO for TEE at this time via bed. Patient hemodynamically stable during transport.

## 2021-08-21 NOTE — Progress Notes (Signed)
PROGRESS NOTE    Sean Riley  WIO:[id number] DOB: Oct 02, 1950 DOA: 08/15/2021 PCP: Colon Branch, MD   Chief Complain: Cough, confusion, shortness of breath  Brief Narrative:  Patient is a 71 year old male with history of COPD, on home oxygen, A. fib on Eliquis,/volume overload., ascending aortic dissection s/p repair, atrial valve replacement, hypertension, GI bleed, GERD who presented from home with complaints of confusion, cough, shortness of breath.  On presentation he was found to be hypoxic.  He had to put on BiPAP.  He was admitted under ICU service.  Found to have enterococcal bacteremia.  ID following.  Cardiology consulted for severe congestive heart failure/volume overload.HF team following.  On oxygen today at 6L /min  Plan for TEE today.  If his oxygen requirement improves, may be discharged tomorrow to home with home health  Assessment & Plan:   Principal Problem:   Enterococcal bacteremia Active Problems:   Atrial fibrillation (HCC)   COPD (chronic obstructive pulmonary disease) (HCC)   Normocytic anemia   Acute on chronic respiratory failure with hypoxia (HCC)   Chronic diastolic CHF (congestive heart failure) (HCC)   Severe sepsis with septic shock (HCC)   AKI (acute kidney injury) ([facility])   Pressure injury of skin   Acute metabolic encephalopathy   Prosthetic valve endocarditis (HCC)   Acute on chronic diastolic CHF (congestive heart failure) (HCC)   Severe sepsis with septic shock: Secondary to Enterococcus faecalis bacteremia.  Source is unclear.  Concern for infective endocarditis.  Has history of bioprosthetic aortic valve.  ID following.  Currently on ampicillin, ceftriaxone.  Plan for TEE today.  Remains afebrile, no leukocytosis.  CT abdomen/pelvis did not show any obvious source but showed diverticulosis.  Repeat blood cultures have not shown any growth  Acute on chronic hypoxic respiratory failure: Multifactorial secondary to COPD, CHF.  Uses 3 to 4 L of  oxygen per minute at home.  Currently on 6L  oxygen.    Chest x-ray on 1/8 showed emphysema, concern for volume overload.  Has chronic bilateral pleural effusion.   Continue bronchodilators as needed.   Acute on chronic diastolic congestive heart failure: Started on torsemide 60 mg daily  Heart failure team following.  Monitor electrolytes.  Echo done here on 08/16/2021 showed EF of 55 to 60%, moderately elevated pulmonary artery pressure, moderately enlarged right ventricle  AKI on CKD stage IIIb: Baseline creatinine ranged from 1.3-1.5.  Currently kidney function at baseline.  Acute metabolic encephalopathy: Most likely secondary to sepsis.  Currently he is alert and oriented.  COPD: Currently without exacerbation.  Continue bronchodilators as needed.  Continue supplemental oxygen  Paroxysmal A. fib: On Eliquis for anticoagulation.  Currently on heparin drip.  On metoprolol and flecainide.  Has history of aortic valve replacement and has history of thoracic aortic aneurysm repair in 2018.  Cardiology following.  Normocytic anemia: Currently hemoglobin stable.  No signs of bleeding  History of sleep apnea: On CPAP  Deconditioning/debility: PT/OT recommend home health          DVT prophylaxis:Heparin drip Code Status: Full Family Communication: Wife at the bedside Patient status:Inpatient  Dispo: The patient is from: Home              Anticipated d/c is to: Home              Anticipated d/c date is: May be discharged to home tomorrow depending upon the TEE report and success on weaning the oxygen to his baseline  Consultants: Cardiology,  ID  Procedures: None  Antimicrobials:  Anti-infectives (From admission, onward)    Start     Dose/Rate Route Frequency Ordered Stop   08/17/21 1530  cefTRIAXone (ROCEPHIN) 2 g in sodium chloride 0.9 % 100 mL IVPB        2 g 200 mL/hr over 30 Minutes Intravenous Every 12 hours 08/17/21 1435     08/16/21 2000  vancomycin (VANCOREADY) IVPB  1250 mg/250 mL  Status:  Discontinued        1,250 mg 166.7 mL/hr over 90 Minutes Intravenous Every 24 hours 08/15/21 1957 08/16/21 0844   08/16/21 0930  ampicillin (OMNIPEN) 2 g in sodium chloride 0.9 % 100 mL IVPB        2 g 300 mL/hr over 20 Minutes Intravenous Every 4 hours 08/16/21 0844     08/16/21 0600  ceFEPIme (MAXIPIME) 2 g in sodium chloride 0.9 % 100 mL IVPB  Status:  Discontinued        2 g 200 mL/hr over 30 Minutes Intravenous Every 12 hours 08/15/21 1957 08/16/21 0844   08/15/21 1715  vancomycin (VANCOREADY) IVPB 1750 mg/350 mL        1,750 mg 175 mL/hr over 120 Minutes Intravenous  Once 08/15/21 1711 08/15/21 2102   08/15/21 1700  ceFEPIme (MAXIPIME) 2 g in sodium chloride 0.9 % 100 mL IVPB        2 g 200 mL/hr over 30 Minutes Intravenous  Once 08/15/21 1655 08/15/21 1803   08/15/21 1700  metroNIDAZOLE (FLAGYL) IVPB 500 mg        500 mg 100 mL/hr over 60 Minutes Intravenous  Once 08/15/21 1655 08/15/21 1833   08/15/21 1700  vancomycin (VANCOCIN) IVPB 1000 mg/200 mL premix  Status:  Discontinued        1,000 mg 200 mL/hr over 60 Minutes Intravenous  Once 08/15/21 1655 08/15/21 1711       Subjective: Patient seen and examined at the bedside this morning.  Hemodynamically stable sitting in the chair.  On 6 L of high flow oxygen.  Denies any worsening shortness of breath or cough.Waiting for TEE  Objective: Vitals:   08/20/21 1934 08/20/21 2319 08/21/21 0317 08/21/21 0818  BP: (!) 142/58 119/84 (!) 113/53 (!) 142/75  Pulse: 79 61 62 66  Resp: (!) 25 19 18 20   Temp: 97.6 F (36.4 C) (!) 97.5 F (36.4 C) 97.6 F (36.4 C) (!) 97.5 F (36.4 C)  TempSrc: Oral Oral Axillary Axillary  SpO2: 93% 90% 90% 91%  Weight:   89.3 kg   Height:        Intake/Output Summary (Last 24 hours) at 08/21/2021 0854 Last data filed at 08/20/2021 2117 Gross per 24 hour  Intake --  Output 2650 ml  Net -2650 ml   Filed Weights   08/19/21 0500 08/20/21 0400 08/21/21 0317  Weight:  88.7 kg 92.2 kg 89.3 kg    Examination:   General exam: Weak, chronically ill looking, sitting in the chair HEENT: PERRL Respiratory system: Diminished air sounds in the bases, no wheezes or crackles  Cardiovascular system: S1 & S2 heard, RRR.  Gastrointestinal system: Abdomen is nondistended, soft and nontender. Central nervous system: Alert and oriented Extremities: No edema, no clubbing ,no cyanosis Skin: No rashes, no ulcers,no icterus      Data Reviewed: I have personally reviewed following labs and imaging studies  CBC: Recent Labs  Lab 08/15/21 1650 08/15/21 1743 08/16/21 0601 08/17/21 0246 08/18/21 0029 08/19/21 0246 08/20/21 0220  WBC  11.3*   < > 13.8* 9.1 8.2 8.5 9.4  NEUTROABS 10.1*  --   --   --   --   --   --   HGB 12.1*   < > 11.0* 11.0* 10.9* 10.8* 10.9*  HCT 37.1*   < > 36.7* 34.0* 33.8* 34.6* 35.3*  MCV 82.1   < > 86.6 82.1 83.5 84.4 85.1  PLT 189   < > 147* 180 180 219 265   < > = values in this interval not displayed.   Basic Metabolic Panel: Recent Labs  Lab 08/16/21 0149 08/16/21 0440 08/17/21 1031 08/18/21 0029 08/19/21 0246 08/20/21 0220 08/21/21 0119  NA 133*   < > 134* 136 135 133* 135  K 3.7   < > 3.4* 3.9 3.9 4.0 3.7  CL 98  --  100 107 101 99 97*  CO2 24  --  25 25 27 25 29   GLUCOSE 128*  --  132* 105* 99 92 90  BUN 27*  --  24* 16 12 13 14   CREATININE 1.61*  --  1.19 1.05 1.19 1.23 1.20  CALCIUM 8.4*  --  8.2* 7.7* 7.9* 8.1* 8.2*  MG 2.0  --  2.1  --  2.0  --   --   PHOS 3.9  --   --   --   --   --   --    < > = values in this interval not displayed.   GFR: Estimated Creatinine Clearance: 63.8 mL/min (by C-G formula based on SCr of 1.2 mg/dL). Liver Function Tests: Recent Labs  Lab 08/15/21 1650  AST 68*  ALT 36  ALKPHOS 119  BILITOT 1.4*  PROT 7.2  ALBUMIN 3.2*   No results for input(s): LIPASE, AMYLASE in the last 168 hours. Recent Labs  Lab 08/16/21 0149  AMMONIA 31   Coagulation Profile: Recent Labs  Lab  08/15/21 1650  INR 1.6*   Cardiac Enzymes: No results for input(s): CKTOTAL, CKMB, CKMBINDEX, TROPONINI in the last 168 hours. BNP (last 3 results) No results for input(s): PROBNP in the last 8760 hours. HbA1C: No results for input(s): HGBA1C in the last 72 hours. CBG: Recent Labs  Lab 08/20/21 1932 08/20/21 2053 08/20/21 2318 08/21/21 0316 08/21/21 0820  GLUCAP 139* 123* 94 96 77   Lipid Profile: No results for input(s): CHOL, HDL, LDLCALC, TRIG, CHOLHDL, LDLDIRECT in the last 72 hours. Thyroid Function Tests: No results for input(s): TSH, T4TOTAL, FREET4, T3FREE, THYROIDAB in the last 72 hours. Anemia Panel: No results for input(s): VITAMINB12, FOLATE, FERRITIN, TIBC, IRON, RETICCTPCT in the last 72 hours. Sepsis Labs: Recent Labs  Lab 08/15/21 1650 08/16/21 0149 08/17/21 0246  PROCALCITON  --  0.78 0.51  LATICACIDVEN 1.3  --   --     Recent Results (from the past 240 hour(s))  Culture, blood (Routine x 2)     Status: Abnormal   Collection Time: 08/15/21  4:50 PM   Specimen: BLOOD  Result Value Ref Range Status   Specimen Description BLOOD SITE NOT SPECIFIED  Final   Special Requests   Final    BOTTLES DRAWN AEROBIC AND ANAEROBIC Blood Culture results may not be optimal due to an excessive volume of blood received in culture bottles   Culture  Setup Time   Final    GRAM POSITIVE COCCI IN BOTH AEROBIC AND ANAEROBIC BOTTLES CRITICAL VALUE NOTED.  VALUE IS CONSISTENT WITH PREVIOUSLY REPORTED AND CALLED VALUE.    Culture (A)  Final    ENTEROCOCCUS FAECALIS SUSCEPTIBILITIES PERFORMED ON PREVIOUS CULTURE WITHIN THE LAST 5 DAYS. Performed at [facility], [place] [address] [place], [place] [address]    Report Status 08/18/2021 FINAL  Final  Resp Panel by RT-PCR (Flu A&B, Covid) Peripheral     Status: None   Collection Time: 08/15/21  4:51 PM   Specimen: Peripheral; Nasopharyngeal(NP) swabs in vial transport medium  Result Value Ref Range Status   SARS  Coronavirus 2 by RT PCR NEGATIVE NEGATIVE Final    Comment: (NOTE) SARS-CoV-2 target nucleic acids are NOT DETECTED.  The SARS-CoV-2 RNA is generally detectable in upper respiratory specimens during the acute phase of infection. The lowest concentration of SARS-CoV-2 viral copies this assay can detect is 138 copies/mL. A negative result does not preclude SARS-Cov-2 infection and should not be used as the sole basis for treatment or other patient management decisions. A negative result may occur with  improper specimen collection/handling, submission of specimen other than nasopharyngeal swab, presence of viral mutation(s) within the areas targeted by this assay, and inadequate number of viral copies(<138 copies/mL). A negative result must be combined with clinical observations, patient history, and epidemiological information. The expected result is Negative.  Fact Sheet for Patients:  EntrepreneurPulse.com.au  Fact Sheet for Healthcare Providers:  IncredibleEmployment.be  This test is no t yet approved or cleared by the [place] FDA and  has been authorized for detection and/or diagnosis of SARS-CoV-2 by FDA under an Emergency Use Authorization (EUA). This EUA will remain  in effect (meaning this test can be used) for the duration of the COVID-19 declaration under Section 564(b)(1) of the Act, 21 U.S.C.section 360bbb-3(b)(1), unless the authorization is terminated  or revoked sooner.       Influenza A by PCR NEGATIVE NEGATIVE Final   Influenza B by PCR NEGATIVE NEGATIVE Final    Comment: (NOTE) The Xpert Xpress SARS-CoV-2/FLU/RSV plus assay is intended as an aid in the diagnosis of influenza from Nasopharyngeal swab specimens and should not be used as a sole basis for treatment. Nasal washings and aspirates are unacceptable for Xpert Xpress SARS-CoV-2/FLU/RSV testing.  Fact Sheet for  Patients: EntrepreneurPulse.com.au  Fact Sheet for Healthcare Providers: IncredibleEmployment.be  This test is not yet approved or cleared by the [place] FDA and has been authorized for detection and/or diagnosis of SARS-CoV-2 by FDA under an Emergency Use Authorization (EUA). This EUA will remain in effect (meaning this test can be used) for the duration of the COVID-19 declaration under Section 564(b)(1) of the Act, 21 U.S.C. section 360bbb-3(b)(1), unless the authorization is terminated or revoked.  Performed at [facility], [place] [address], [place], [place] [address]   Culture, blood (Routine x 2)     Status: Abnormal   Collection Time: 08/15/21  5:02 PM   Specimen: BLOOD  Result Value Ref Range Status   Specimen Description BLOOD SITE NOT SPECIFIED  Final   Special Requests   Final    BOTTLES DRAWN AEROBIC AND ANAEROBIC Blood Culture results may not be optimal due to an excessive volume of blood received in culture bottles   Culture  Setup Time   Final    GRAM POSITIVE COCCI IN CHAINS IN BOTH AEROBIC AND ANAEROBIC BOTTLES CRITICAL RESULT CALLED TO, READ BACK BY AND VERIFIED WITHJiles Garter [facility] PHARMD @0817  08/16/21 EB Performed at [facility], [place] [address], [place], [place] [address]    Culture ENTEROCOCCUS FAECALIS (A)  Final  Report Status 08/18/2021 FINAL  Final   Organism ID, Bacteria ENTEROCOCCUS FAECALIS  Final      Susceptibility   Enterococcus faecalis - MIC*    AMPICILLIN <=2 SENSITIVE Sensitive     VANCOMYCIN 1 SENSITIVE Sensitive     GENTAMICIN SYNERGY SENSITIVE Sensitive     * ENTEROCOCCUS FAECALIS  Blood Culture ID Panel (Reflexed)     Status: Abnormal   Collection Time: 08/15/21  5:02 PM  Result Value Ref Range Status   Enterococcus faecalis DETECTED (A) NOT DETECTED Final    Comment: CRITICAL RESULT CALLED TO, READ BACK BY AND VERIFIED WITH: H, VON DOHLEN PHARMD @0817  08/16/21 EB    Enterococcus  Faecium NOT DETECTED NOT DETECTED Final   Listeria monocytogenes NOT DETECTED NOT DETECTED Final   Staphylococcus species NOT DETECTED NOT DETECTED Final   Staphylococcus aureus (BCID) NOT DETECTED NOT DETECTED Final   Staphylococcus epidermidis NOT DETECTED NOT DETECTED Final   Staphylococcus lugdunensis NOT DETECTED NOT DETECTED Final   Streptococcus species NOT DETECTED NOT DETECTED Final   Streptococcus agalactiae NOT DETECTED NOT DETECTED Final   Streptococcus pneumoniae NOT DETECTED NOT DETECTED Final   Streptococcus pyogenes NOT DETECTED NOT DETECTED Final   A.calcoaceticus-baumannii NOT DETECTED NOT DETECTED Final   Bacteroides fragilis NOT DETECTED NOT DETECTED Final   Enterobacterales NOT DETECTED NOT DETECTED Final   Enterobacter cloacae complex NOT DETECTED NOT DETECTED Final   Escherichia coli NOT DETECTED NOT DETECTED Final   Klebsiella aerogenes NOT DETECTED NOT DETECTED Final   Klebsiella oxytoca NOT DETECTED NOT DETECTED Final   Klebsiella pneumoniae NOT DETECTED NOT DETECTED Final   Proteus species NOT DETECTED NOT DETECTED Final   Salmonella species NOT DETECTED NOT DETECTED Final   Serratia marcescens NOT DETECTED NOT DETECTED Final   Haemophilus influenzae NOT DETECTED NOT DETECTED Final   Neisseria meningitidis NOT DETECTED NOT DETECTED Final   Pseudomonas aeruginosa NOT DETECTED NOT DETECTED Final   Stenotrophomonas maltophilia NOT DETECTED NOT DETECTED Final   Candida albicans NOT DETECTED NOT DETECTED Final   Candida auris NOT DETECTED NOT DETECTED Final   Candida glabrata NOT DETECTED NOT DETECTED Final   Candida krusei NOT DETECTED NOT DETECTED Final   Candida parapsilosis NOT DETECTED NOT DETECTED Final   Candida tropicalis NOT DETECTED NOT DETECTED Final   Cryptococcus neoformans/gattii NOT DETECTED NOT DETECTED Final   Vancomycin resistance NOT DETECTED NOT DETECTED Final    Comment: Performed at [facility], [address], [place], [place]  [address]  Urine Culture     Status: None   Collection Time: 08/15/21  6:57 PM   Specimen: In/Out Cath Urine  Result Value Ref Range Status   Specimen Description IN/OUT CATH URINE  Final   Special Requests NONE  Final   Culture   Final    NO GROWTH Performed at [facility], [place] [address] [place], [place] [address]    Report Status 08/17/2021 FINAL  Final  MRSA Next Gen by PCR, Nasal     Status: None   Collection Time: 08/15/21 11:30 PM   Specimen: Nasal Mucosa; Nasal Swab  Result Value Ref Range Status   MRSA by PCR Next Gen NOT DETECTED NOT DETECTED Final    Comment: (NOTE) The GeneXpert MRSA Assay (FDA approved for NASAL specimens only), is one component of a comprehensive MRSA colonization surveillance program. It is not intended to diagnose MRSA infection nor to guide or monitor treatment for MRSA infections. Test performance is not FDA  approved in patients less than 36 years old. Performed at [facility], [place] [address] [place], [place] [address]   Culture, blood (Routine X 2) w Reflex to ID Panel     Status: None (Preliminary result)   Collection Time: 08/16/21  2:57 PM   Specimen: BLOOD  Result Value Ref Range Status   Specimen Description BLOOD SITE NOT SPECIFIED  Final   Special Requests   Final    AEROBIC BOTTLE ONLY Blood Culture results may not be optimal due to an inadequate volume of blood received in culture bottles   Culture   Final    NO GROWTH 4 DAYS Performed at [facility], [place] [address] [place], [place] [address]    Report Status PENDING  Incomplete  Culture, blood (Routine X 2) w Reflex to ID Panel     Status: None (Preliminary result)   Collection Time: 08/16/21  2:58 PM   Specimen: BLOOD  Result Value Ref Range Status   Specimen Description BLOOD SITE NOT SPECIFIED  Final   Special Requests   Final    AEROBIC BOTTLE ONLY Blood Culture results may not be optimal due to an inadequate volume of blood received in culture bottles    Culture   Final    NO GROWTH 4 DAYS Performed at [facility], [place] [address], [place],  [address]    Report Status PENDING  Incomplete         Radiology Studies: No results found.      Scheduled Meds:  ALPRAZolam  1 mg Oral QHS   Chlorhexidine Gluconate Cloth  6 each Topical Daily   escitalopram  10 mg Oral Daily   flecainide  75 mg Oral BID   insulin aspart  0-15 Units Subcutaneous Q4H   mouth rinse  15 mL Mouth Rinse BID   metoprolol tartrate  12.5 mg Oral BID   pantoprazole  40 mg Oral QHS   potassium chloride  40 mEq Oral Once   rosuvastatin  5 mg Oral Daily   torsemide  60 mg Oral Daily   triamcinolone cream   Topical BID   umeclidinium-vilanterol  1 puff Inhalation Daily   Continuous Infusions:  sodium chloride Stopped (08/16/21 0004)   sodium chloride 20 mL/hr at 08/21/21 0837   ampicillin (OMNIPEN) IV 2 g (08/21/21 0840)   cefTRIAXone (ROCEPHIN)  IV 2 g (08/21/21 0641)   heparin 1,600 Units/hr (08/20/21 1928)     LOS: 6 days    Time spent: 35 mins.More than 50% of that time was spent in counseling and/or coordination of care.      Shelly Coss, MD [facility] P1/07/2022, 8:54 AM

## 2021-08-21 NOTE — Progress Notes (Signed)
ANTICOAGULATION CONSULT NOTE - Follow Up Consult  Pharmacy Consult for heparin Indication: atrial fibrillation  Labs: Recent Labs    08/19/21 0246 08/20/21 0220 08/20/21 1027 08/21/21 0119  HGB 10.8* 10.9*  --   --   HCT 34.6* 35.3*  --   --   PLT 219 265  --   --   HEPARINUNFRC 0.38 0.21* 0.47 0.51  CREATININE 1.19 1.23  --  1.20     Assessment: 71yo male now therapeutic on heparin for Afib while Eliquis on hold  Goal of Therapy:  Heparin level 0.3-0.7 units/ml   Plan:  Continue heparin at current rate Follow up AM labs Follow up for Eliquis resumption?  Thank you Anette Guarneri, PharmD 08/21/2021,8:47 AM

## 2021-08-21 NOTE — Progress Notes (Signed)
[facility] Infectious Diseases Follow Up Note  Patient Identification: Patient Name: Sean Riley MRN: [id number] [place] Date: 08/15/2021  4:22 PM Age: 71 y.o.Today's Date: 08/21/2021   Reason for Visit: Enterococcal bacteremia   Principal Problem:   Enterococcal bacteremia Active Problems:   Atrial fibrillation (HCC)   COPD (chronic obstructive pulmonary disease) (HCC)   Normocytic anemia   Acute on chronic respiratory failure with hypoxia (HCC)   Chronic diastolic CHF (congestive heart failure) (HCC)   Severe sepsis with septic shock (HCC)   AKI (acute kidney injury) ([place])   Pressure injury of skin   Acute metabolic encephalopathy   Prosthetic valve endocarditis (HCC)   Acute on chronic diastolic CHF (congestive heart failure) (HCC)   Antibiotics: Ampicillin 1/7-current                    Cefepime 1/6                    Ceftriaxone 1/8-current  Lines/Hardwares: PIV, external urethral catheter  Interval Events: afebrile, no leukocytosis    Assessment 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  Acute respiratory failure in the setting of COPD and CHF/Volume overload  - on diuretics; Cardiology following - Oxygen requirement decreasing   Recommendations Continue IV ampicillin and IVV ceftriaxone for presumptive aortic valve endocarditis TEE planned for today, fu results Monitor CBC and CMP Fu cardiology recs  Rest of the management as per the primary team. Thank you for the consult. Please page with pertinent questions or concerns.  ______________________________________________________________________ Subjective patient seen and examined at the bedside.   Sitting up in the chair and  working with PT  Vitals BP (!) 113/53 (BP Location: Left Arm)    Pulse 62    Temp 97.6 F (36.4 C) (Axillary)    Resp 18    Ht 6\' 1"  (1.854 m)    Wt 89.3 kg    SpO2 90%    BMI 25.97 kg/m     Physical Exam Constitutional: Sitting up in the chair     Comments:   Cardiovascular:     Rate and Rhythm: Normal rate and regular rhythm.     Heart sounds: Irregular rhythm and systolic murmur  Pulmonary:     Effort: On 6 L of nasal cannula    Comments: Basal crackles present  Abdominal:     Palpations: Abdomen is soft.     Tenderness: Nontender and nondistended  Musculoskeletal:        General: No swelling or tenderness.   Skin:    Comments: No lesions or rashes  Neurological:     General: No focal deficit present.  Awake alert and oriented.  Follows commands  Psychiatric:        Mood and Affect: Mood normal.  Calm and cooperative    Pertinent Microbiology Results for orders placed or performed during the hospital encounter of 08/15/21  Culture, blood (Routine x 2)     Status: Abnormal   Collection Time: 08/15/21  4:50 PM   Specimen: BLOOD  Result Value Ref Range Status   Specimen Description BLOOD SITE NOT SPECIFIED  Final   Special Requests   Final    BOTTLES DRAWN AEROBIC AND ANAEROBIC Blood Culture results may not be optimal due to an excessive  volume of blood received in culture bottles   Culture  Setup Time   Final    GRAM POSITIVE COCCI IN BOTH AEROBIC AND ANAEROBIC BOTTLES CRITICAL VALUE NOTED.  VALUE IS CONSISTENT WITH PREVIOUSLY REPORTED AND CALLED VALUE.    Culture (A)  Final    ENTEROCOCCUS FAECALIS SUSCEPTIBILITIES PERFORMED ON PREVIOUS CULTURE WITHIN THE LAST 5 DAYS. Performed at [facility], [place] [address], [place], [place] [address]    Report Status 08/18/2021 FINAL  Final  Resp Panel by RT-PCR (Flu A&B, Covid) Peripheral     Status: None   Collection Time: 08/15/21  4:51 PM   Specimen: Peripheral; Nasopharyngeal(NP) swabs in vial transport  medium  Result Value Ref Range Status   SARS Coronavirus 2 by RT PCR NEGATIVE NEGATIVE Final    Comment: (NOTE) SARS-CoV-2 target nucleic acids are NOT DETECTED.  The SARS-CoV-2 RNA is generally detectable in upper respiratory specimens during the acute phase of infection. The lowest concentration of SARS-CoV-2 viral copies this assay can detect is 138 copies/mL. A negative result does not preclude SARS-Cov-2 infection and should not be used as the sole basis for treatment or other patient management decisions. A negative result may occur with  improper specimen collection/handling, submission of specimen other than nasopharyngeal swab, presence of viral mutation(s) within the areas targeted by this assay, and inadequate number of viral copies(<138 copies/mL). A negative result must be combined with clinical observations, patient history, and epidemiological information. The expected result is Negative.  Fact Sheet for Patients:  EntrepreneurPulse.com.au  Fact Sheet for Healthcare Providers:  IncredibleEmployment.be  This test is no t yet approved or cleared by the [place] FDA and  has been authorized for detection and/or diagnosis of SARS-CoV-2 by FDA under an Emergency Use Authorization (EUA). This EUA will remain  in effect (meaning this test can be used) for the duration of the COVID-19 declaration under Section 564(b)(1) of the Act, 21 U.S.C.section 360bbb-3(b)(1), unless the authorization is terminated  or revoked sooner.       Influenza A by PCR NEGATIVE NEGATIVE Final   Influenza B by PCR NEGATIVE NEGATIVE Final    Comment: (NOTE) The Xpert Xpress SARS-CoV-2/FLU/RSV plus assay is intended as an aid in the diagnosis of influenza from Nasopharyngeal swab specimens and should not be used as a sole basis for treatment. Nasal washings and aspirates are unacceptable for Xpert Xpress SARS-CoV-2/FLU/RSV testing.  Fact Sheet for  Patients: EntrepreneurPulse.com.au  Fact Sheet for Healthcare Providers: IncredibleEmployment.be  This test is not yet approved or cleared by the [place] FDA and has been authorized for detection and/or diagnosis of SARS-CoV-2 by FDA under an Emergency Use Authorization (EUA). This EUA will remain in effect (meaning this test can be used) for the duration of the COVID-19 declaration under Section 564(b)(1) of the Act, 21 U.S.C. section 360bbb-3(b)(1), unless the authorization is terminated or revoked.  Performed at [facility], [place] [address] [place], [place] [address]   Culture, blood (Routine x 2)     Status: Abnormal   Collection Time: 08/15/21  5:02 PM   Specimen: BLOOD  Result Value Ref Range Status   Specimen Description BLOOD SITE NOT SPECIFIED  Final   Special Requests   Final    BOTTLES DRAWN AEROBIC AND ANAEROBIC Blood Culture results may not be optimal due to an excessive volume of blood received in culture bottles   Culture  Setup Time   Final    GRAM POSITIVE COCCI IN CHAINS  IN BOTH AEROBIC AND ANAEROBIC BOTTLES CRITICAL RESULT CALLED TO, READ BACK BY AND VERIFIED WITHJiles [facility] PHARMD @0817  08/16/21 EB Performed at [place] [address] [place], [place] [address]    Culture ENTEROCOCCUS FAECALIS (A)  Final   Report Status 08/18/2021 FINAL  Final   Organism ID, Bacteria ENTEROCOCCUS FAECALIS  Final      Susceptibility   Enterococcus faecalis - MIC*    AMPICILLIN <=2 SENSITIVE Sensitive     VANCOMYCIN 1 SENSITIVE Sensitive     GENTAMICIN SYNERGY SENSITIVE Sensitive     * ENTEROCOCCUS FAECALIS  Blood Culture ID Panel (Reflexed)     Status: Abnormal   Collection Time: 08/15/21  5:02 PM  Result Value Ref Range Status   Enterococcus faecalis DETECTED (A) NOT DETECTED Final    Comment: CRITICAL RESULT CALLED TO, READ BACK BY AND VERIFIED WITH: H, VON DOHLEN PHARMD @0817  08/16/21 EB    Enterococcus  Faecium NOT DETECTED NOT DETECTED Final   Listeria monocytogenes NOT DETECTED NOT DETECTED Final   Staphylococcus species NOT DETECTED NOT DETECTED Final   Staphylococcus aureus (BCID) NOT DETECTED NOT DETECTED Final   Staphylococcus epidermidis NOT DETECTED NOT DETECTED Final   Staphylococcus lugdunensis NOT DETECTED NOT DETECTED Final   Streptococcus species NOT DETECTED NOT DETECTED Final   Streptococcus agalactiae NOT DETECTED NOT DETECTED Final   Streptococcus pneumoniae NOT DETECTED NOT DETECTED Final   Streptococcus pyogenes NOT DETECTED NOT DETECTED Final   A.calcoaceticus-baumannii NOT DETECTED NOT DETECTED Final   Bacteroides fragilis NOT DETECTED NOT DETECTED Final   Enterobacterales NOT DETECTED NOT DETECTED Final   Enterobacter cloacae complex NOT DETECTED NOT DETECTED Final   Escherichia coli NOT DETECTED NOT DETECTED Final   Klebsiella aerogenes NOT DETECTED NOT DETECTED Final   Klebsiella oxytoca NOT DETECTED NOT DETECTED Final   Klebsiella pneumoniae NOT DETECTED NOT DETECTED Final   Proteus species NOT DETECTED NOT DETECTED Final   Salmonella species NOT DETECTED NOT DETECTED Final   Serratia marcescens NOT DETECTED NOT DETECTED Final   Haemophilus influenzae NOT DETECTED NOT DETECTED Final   Neisseria meningitidis NOT DETECTED NOT DETECTED Final   Pseudomonas aeruginosa NOT DETECTED NOT DETECTED Final   Stenotrophomonas maltophilia NOT DETECTED NOT DETECTED Final   Candida albicans NOT DETECTED NOT DETECTED Final   Candida auris NOT DETECTED NOT DETECTED Final   Candida glabrata NOT DETECTED NOT DETECTED Final   Candida krusei NOT DETECTED NOT DETECTED Final   Candida parapsilosis NOT DETECTED NOT DETECTED Final   Candida tropicalis NOT DETECTED NOT DETECTED Final   Cryptococcus neoformans/gattii NOT DETECTED NOT DETECTED Final   Vancomycin resistance NOT DETECTED NOT DETECTED Final    Comment: Performed at [facility], [place] [address], [place], [place]  [address]  Urine Culture     Status: None   Collection Time: 08/15/21  6:57 PM   Specimen: In/Out Cath Urine  Result Value Ref Range Status   Specimen Description IN/OUT CATH URINE  Final   Special Requests NONE  Final   Culture   Final    NO GROWTH Performed at [facility], [place] [address] [place], [place] [address]    Report Status 08/17/2021 FINAL  Final  MRSA Next Gen by PCR, Nasal     Status: None   Collection Time: 08/15/21 11:30 PM   Specimen: Nasal Mucosa; Nasal Swab  Result Value Ref Range Status   MRSA by PCR Next Gen NOT DETECTED NOT DETECTED Final  Comment: (NOTE) The GeneXpert MRSA Assay (FDA approved for NASAL specimens only), is one component of a comprehensive MRSA colonization surveillance program. It is not intended to diagnose MRSA infection nor to guide or monitor treatment for MRSA infections. Test performance is not FDA approved in patients less than 58 years old. Performed at [facility], [place] [address] [place], [place] [address]   Culture, blood (Routine X 2) w Reflex to ID Panel     Status: None (Preliminary result)   Collection Time: 08/16/21  2:57 PM   Specimen: BLOOD  Result Value Ref Range Status   Specimen Description BLOOD SITE NOT SPECIFIED  Final   Special Requests   Final    AEROBIC BOTTLE ONLY Blood Culture results may not be optimal due to an inadequate volume of blood received in culture bottles   Culture   Final    NO GROWTH 4 DAYS Performed at [facility], [place] [address], [place], [place] [address]    Report Status PENDING  Incomplete  Culture, blood (Routine X 2) w Reflex to ID Panel     Status: None (Preliminary result)   Collection Time: 08/16/21  2:58 PM   Specimen: BLOOD  Result Value Ref Range Status   Specimen Description BLOOD SITE NOT SPECIFIED  Final   Special Requests   Final    AEROBIC BOTTLE ONLY Blood Culture results may not be optimal due to an inadequate volume of blood received in culture bottles    Culture   Final    NO GROWTH 4 DAYS Performed at [facility], [place] [address] [place], [place] [address]    Report Status PENDING  Incomplete    Pertinent Lab. CBC Latest Ref Rng & Units 08/20/2021 08/19/2021 08/18/2021  WBC 4.0 - 10.5 K/uL 9.4 8.5 8.2  Hemoglobin 13.0 - 17.0 g/dL 10.9(L) 10.8(L) 10.9(L)  Hematocrit 39.0 - 52.0 % 35.3(L) 34.6(L) 33.8(L)  Platelets 150 - 400 K/uL 265 219 180   CMP Latest Ref Rng & Units 08/21/2021 08/20/2021 08/19/2021  Glucose 70 - 99 mg/dL 90 92 99  BUN 8 - 23 mg/dL 14 13 12   Creatinine 0.61 - 1.24 mg/dL 1.20 1.23 1.19  Sodium 135 - 145 mmol/L 135 133(L) 135  Potassium 3.5 - 5.1 mmol/L 3.7 4.0 3.9  Chloride 98 - 111 mmol/L 97(L) 99 101  CO2 22 - 32 mmol/L 29 25 27   Calcium 8.9 - 10.3 mg/dL 8.2(L) 8.1(L) 7.9(L)  Total Protein 6.5 - 8.1 g/dL - - -  Total Bilirubin 0.3 - 1.2 mg/dL - - -  Alkaline Phos 38 - 126 U/L - - -  AST 15 - 41 U/L - - -  ALT 0 - 44 U/L - - -     Pertinent Imaging today Plain films and CT images have been personally visualized and interpreted; radiology reports have been reviewed. Decision making incorporated into the Impression / Recommendations.  I spent approx 36 minutes for this patient encounter including review of prior medical records, coordination of care with primary/other specialist with greater than 50% of time being face to face/counseling and discussing diagnostics/treatment plan with the patient/family.  Electronically signed by:   Rosiland Oz, MD Infectious Disease Physician [facility] Pager: [phone]

## 2021-08-21 NOTE — Anesthesia Procedure Notes (Signed)
Procedure Name: MAC Date/Time: 08/21/2021 1:50 PM Performed by: Reece Agar, CRNA Pre-anesthesia Checklist: Patient identified, Emergency Drugs available, Suction available and Patient being monitored Patient Re-evaluated:Patient Re-evaluated prior to induction Oxygen Delivery Method: Nasal cannula Preoxygenation: Pre-oxygenation with 100% oxygen Induction Type: IV induction

## 2021-08-21 NOTE — Progress Notes (Addendum)
Advanced Heart Failure Rounding Note  PCP-Cardiologist: Dr. Aundra Dubin   Subjective:    Respiratory status improving, down from 8L [place] to 4-5L [place] (baseline 4 L).   2.7L UOP charted yesterday. ? Fluctuations in weight. On IV lasix  AF. Remains on ceftriaxone and ampicillin per ID. WBC normal yesterday.   Repeat BC from 01/07 with NGTD  Sitting up in chair. Getting ready to work with PT.   Denies dyspnea. Reports good diuresis yesterday. No complaints.  Objective:   Weight Range: 89.3 kg Body mass index is 25.97 kg/m.   Vital Signs:   Temp:  [97.5 F (36.4 C)-97.8 F (36.6 C)] 97.6 F (36.4 C) (01/12 0317) Pulse Rate:  [61-80] 62 (01/12 0317) Resp:  [18-25] 18 (01/12 0317) BP: (91-142)/(53-94) 113/53 (01/12 0317) SpO2:  [87 %-98 %] 90 % (01/12 0317) Weight:  [89.3 kg] 89.3 kg (01/12 0317) Last BM Date: 08/18/21  Weight change: Filed Weights   08/19/21 0500 08/20/21 0400 08/21/21 0317  Weight: 88.7 kg 92.2 kg 89.3 kg    Intake/Output:   Intake/Output Summary (Last 24 hours) at 08/21/2021 0749 Last data filed at 08/20/2021 2117 Gross per 24 hour  Intake --  Output 2650 ml  Net -2650 ml      Physical Exam   General:  No distress. Comfortable on 5L O2 [place] HEENT: HOH Neck: supple. JVP ~ 8 cm. Carotids 2+ bilat; no bruits. No lymphadenopathy or thryomegaly appreciated. Cor: PMI nondisplaced. Regular rhythm with ectopy. No rubs, gallops or murmurs. Lungs: clear Abdomen: soft, nontender, nondistended. No hepatosplenomegaly. No bruits or masses. Good bowel sounds. Extremities: no cyanosis, clubbing, rash, edema Neuro: alert & orientedx3, cranial nerves grossly intact. moves all 4 extremities w/o difficulty. Affect pleasant    Telemetry   SR with PACs, 60s (personally reviewed)   Labs    CBC Recent Labs    08/19/21 0246 08/20/21 0220  WBC 8.5 9.4  HGB 10.8* 10.9*  HCT 34.6* 35.3*  MCV 84.4 85.1  PLT 219 754   Basic Metabolic Panel Recent Labs     08/19/21 0246 08/20/21 0220 08/21/21 0119  NA 135 133* 135  K 3.9 4.0 3.7  CL 101 99 97*  CO2 27 25 29   GLUCOSE 99 92 90  BUN 12 13 14   CREATININE 1.19 1.23 1.20  CALCIUM 7.9* 8.1* 8.2*  MG 2.0  --   --    Liver Function Tests No results for input(s): AST, ALT, ALKPHOS, BILITOT, PROT, ALBUMIN in the last 72 hours. No results for input(s): LIPASE, AMYLASE in the last 72 hours. Cardiac Enzymes No results for input(s): CKTOTAL, CKMB, CKMBINDEX, TROPONINI in the last 72 hours.  BNP: BNP (last 3 results) Recent Labs    01/29/21 1520 04/09/21 1123 08/16/21 0149  BNP 170.9* 147.0* 303.9*    ProBNP (last 3 results) No results for input(s): PROBNP in the last 8760 hours.   D-Dimer No results for input(s): DDIMER in the last 72 hours. Hemoglobin A1C No results for input(s): HGBA1C in the last 72 hours. Fasting Lipid Panel No results for input(s): CHOL, HDL, LDLCALC, TRIG, CHOLHDL, LDLDIRECT in the last 72 hours. Thyroid Function Tests No results for input(s): TSH, T4TOTAL, T3FREE, THYROIDAB in the last 72 hours.  Invalid input(s): FREET3  Other results:   Imaging    No results found.   Medications:     Scheduled Medications:  ALPRAZolam  1 mg Oral QHS   Chlorhexidine Gluconate Cloth  6 each Topical Daily  escitalopram  10 mg Oral Daily   flecainide  75 mg Oral BID   furosemide  40 mg Intravenous BID   insulin aspart  0-15 Units Subcutaneous Q4H   mouth rinse  15 mL Mouth Rinse BID   metoprolol tartrate  12.5 mg Oral BID   pantoprazole  40 mg Oral QHS   rosuvastatin  5 mg Oral Daily   triamcinolone cream   Topical BID   umeclidinium-vilanterol  1 puff Inhalation Daily    Infusions:  sodium chloride Stopped (08/16/21 0004)   sodium chloride     ampicillin (OMNIPEN) IV 2 g (08/21/21 0503)   cefTRIAXone (ROCEPHIN)  IV 2 g (08/21/21 0641)   heparin 1,600 Units/hr (08/20/21 1928)    PRN Medications: albuterol, bisacodyl, docusate sodium, fluticasone,  magnesium hydroxide, ondansetron (ZOFRAN) IV, polyethylene glycol, polyvinyl alcohol    Patient Profile   71 y/o male w/ chronic diastolic heart failure, Type A aortic dissection, 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 Blood Cx + for enterococcus faecalis  Assessment/Plan   1. 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 Source uncertain. ? LE cellulitis. CT of A/P w/ no acute findings.  UA/UCx negative. CXR ? LLL pneumonia. Initial lactate 1.3. PCT 0.78>>0.51. WBC 11>>8>>9K. Fevers resolved. Treating presumptively for endocarditis. Has bioprosthetic AoV. TTE w/ mod TR, mild MR. Prosthetic AoV not well visualized due to  poor acoustic windows.  - ID now following, on ceftriaxone and ampicillin - CBC today - Plan TEE today - May need PICC for home abx 2. Acute on Chronic Hypoxic Respiratory Failure: multifactorial. Moderate COPD on home O2 (4L/min baseline) + chronic diastolic heart failure. Increased O2 requirements this admit, currently on 8L HFNC, in setting of sepsis s/p IVF resuscitation and ? LLL PNA on CXR. Suspect mild fluid overload post IVF resuscitation - Improving w/ diuresis. Stop IV lasix. Start Torsemide 60 mg daily (home dose). - continue abx per ID  - wean [place] as tolerated  3. Acute on Chronic Diastolic Heart Failure: Echo in 2/20 with EF 55%, mildly decreased RV systolic function, normally functioning bioprosthetic aortic valve. RHC in 11/20 after increasing torsemide showed near-normal filling pressures.  PYP study was not suggestive of ATTR amyloidosis and myeloma panel was negative. Echo in 1/22 showed EF 60-65%, moderate RV enlargement with mildly decreased RV systolic function, PASP 44, bioprosthetic AVR with mean gradient 18 mmHg. Echo this admit, EF 55-60% RV moderately enlarged w/ moderately elevated RVSP 55.4 mmHg, RA  severely dilated, mod TR, mild MR. Prosthetic AoV not well visualized, poor acoustic windows. Peak and mean gradients through the valve are 5 and 2 mm Hg respectively. Previous echo in May 2022, peak and mean gradients were 34 and 18 mm Hg respectively. Diuretics held on admit due to AKI, hypotension and sepsis. Now s/p IVF resuscitation. AKI resolved and now w/ mild fluid overload on exam.  - diuresed w/ IV Lasix. Switching to PO as above. - continue metoprolol 12.5 mg bid  4. AKI: admit SCr 1.9 (baseline ~1.4). In setting of sepsis/ hypotension. Now resolved w/ treatment of sepsis/ IVFs. - Scr stable at 1.2 today. Supp K.  - follow BMP   5. 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 Roxy Manns. Echo 1/22 showed stable bioprosthetic aortic valve.  Dr. Trula Slade has been following his residual dissected aorta.  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 6. Atrial fibrillation: Paroxysmal.  S/p DCCV in 11/21.  SR with PACs today.   - Continue flecainide + metoprolol.  - Eliquis 5 mg bid PTA. On heparin gtt for now  7. COPD: Moderate to severe obstruction on 6/19 PFTs, prior smoker.  He is on home oxygen. Followed by pulmonology   4. OSA: CPAP QHS.    Length of Stay: 6  Sean Riley, [place], PA-C  08/21/2021, 7:49 AM  Advanced Heart Failure Team Pager [phone] (M-F; 7a - 5p)  Please contact [facility] for night-coverage after hours (5p -7a ) and weekends on [email or web address]   Patient seen with PA, agree with the above note.   TEE done today, no endocarditis and stable bioprosthetic aortic valve.   General: NAD Neck: No JVD, no thyromegaly or thyroid nodule.  Lungs: Clear to auscultation bilaterally with normal respiratory effort. CV: Nondisplaced PMI.  Heart regular S1/S2, no S3/S4, 1/6 SEM RUSB. No peripheral edema.   Abdomen: Soft, nontender, no hepatosplenomegaly, no distention.  Skin: Intact without  lesions or rashes.  Neurologic: Alert and oriented x 3.  Psych: Normal affect. Extremities: No clubbing or cyanosis.  HEENT: Normal.   E faecalis bacteremia.  No evidence for endocarditis by TEE.  Duration of antibiotics per ID.   Volume status improved, can transition to torsemide 60 mg daily.   He remains in NSR with PACs.  Continue flecainide, metoprolol, Eliquis.   [name] 08/21/2021 2:11 PM

## 2021-08-21 NOTE — Progress Notes (Signed)
Occupational Therapy Treatment Patient Details Name: Sean Riley MRN: [id number] DOB: 1951-06-10 Today's Date: 08/21/2021   History of present illness Pt is a 71 yo male admitted with confusion, ocugh and SOB, hypoxia requiring Bipap.  Pt found to have enterococcus bacteremia, endocarditis and diastolic heart failure.  Pt with PMH of COPD on home O2, afib, CHF, AAA repair and HTN   OT comments  Patient received in bed and agreeable to OT session. Patient very [place] but followed commands. Patient required min assist and verbal cues to get to EOB and required extra time to allow for O2 to increase with verbal cues for breathing technique. Patient was min assist for transfer to recliner with RW and required increased time to allow O2 to increase to 96 from 85 following transfers while on 6 liters.  Patient performed grooming and LB bathing and dressing with washing feet and doffing and donning socks. Patient required rest break following washing either foot with crossing legs to reach. Acute OT to continue to follow.    Recommendations for follow up therapy are one component of a multi-disciplinary discharge planning process, led by the attending physician.  Recommendations may be updated based on patient status, additional functional criteria and insurance authorization.    Follow Up Recommendations  Home health OT    Assistance Recommended at Discharge Frequent or constant Supervision/Assistance  Patient can return home with the following  A little help with bathing/dressing/bathroom;Assistance with cooking/housework;Help with stairs or ramp for entrance   Equipment Recommendations  None recommended by OT    Recommendations for Other Services      Precautions / Restrictions Precautions Precautions: Fall;Other (comment) Precaution Comments: check O2 levels Restrictions Weight Bearing Restrictions: No       Mobility Bed Mobility Overal bed mobility: Needs Assistance Bed Mobility:  Supine to Sit     Supine to sit: Min assist;HOB elevated     General bed mobility comments: verbal cues to perform with encouragement to perform on his own    Transfers Overall transfer level: Needs assistance Equipment used: Rolling walker (2 wheels) Transfers: Sit to/from Stand;Bed to chair/wheelchair/BSC Sit to Stand: Min assist   Step pivot transfers: Min assist       General transfer comment: verbal cues for hand placement to stand and with walker use     Balance Overall balance assessment: Needs assistance Sitting-balance support: Feet supported Sitting balance-[name]: Good     Standing balance support: Bilateral upper extremity supported;During functional activity;Reliant on assistive device for balance Standing balance-[name]: Poor Standing balance comment: reliant on RW while standing                           ADL either performed or assessed with clinical judgement   ADL Overall ADL's : Needs assistance/impaired     Grooming: Wash/dry hands;Wash/dry face;Sitting;Set up       Lower Body Bathing: Minimal assistance;Sitting/lateral leans;Cueing for compensatory techniques Lower Body Bathing Details (indicate cue type and reason): required extra time to perform. Rest break between washing feet     Lower Body Dressing: Minimal assistance;Cueing for compensatory techniques;Sit to/from stand Lower Body Dressing Details (indicate cue type and reason): doffed and donned socks while seated in chair. required extra time and rest between due to OT dropping to 86 following addressing one foot               General ADL Comments: self care tasks performed seated due to  OT dropping when standing    Extremity/Trunk Assessment              Vision       Perception     Praxis      Cognition Arousal/Alertness: Awake/alert Behavior During Therapy: WFL for tasks assessed/performed Overall Cognitive Status: Within Functional Limits for  tasks assessed Area of Impairment: Problem solving                 Orientation Level: Time;Disoriented to           Problem Solving: Slow processing;Decreased initiation General Comments: required cues for orientation on time, very [facility]          Exercises     Shoulder Instructions       General Comments patient on 6 liters of O2.  Sats in 90s while at rest and drops to 86 performing seated activites    Pertinent Vitals/ Pain       Faces Pain Scale: Hurts a little bit Pain Location: back Pain Descriptors / Indicators: Aching Pain Intervention(s): Monitored during session;Repositioned  Home Living                                          Prior Functioning/Environment              Frequency  Min 2X/week        Progress Toward Goals  OT Goals(current goals can now be found in the care plan section)  Progress towards OT goals: Progressing toward goals  Acute Rehab OT Goals Patient Stated Goal: go home OT Goal Formulation: With patient Time For Goal Achievement: 09/03/21 Potential to Achieve Goals: Good ADL Goals Pt Will Perform Lower Body Bathing: with supervision;sit to/from stand Pt Will Perform Lower Body Dressing: sit to/from stand;with supervision Pt Will Transfer to Toilet: with supervision;ambulating Pt Will Perform Tub/Shower Transfer: Shower transfer;with supervision;ambulating;rolling walker;shower seat Additional ADL Goal #1: pt wil state two things he can do at home to conserve energy during adls with no cues.  Plan Discharge plan remains appropriate    Co-evaluation                 AM-PAC OT "6 Clicks" Daily Activity     Outcome Measure   Help from another person eating meals?: None Help from another person taking care of personal grooming?: None Help from another person toileting, which includes using toliet, bedpan, or urinal?: A Little Help from another person bathing (including washing, rinsing,  drying)?: A Little Help from another person to put on and taking off regular upper body clothing?: A Little Help from another person to put on and taking off regular lower body clothing?: A Lot 6 Click Score: 19    End of Session Equipment Utilized During Treatment: Rolling walker (2 wheels);Oxygen  OT Visit Diagnosis: Unsteadiness on feet (R26.81);Other abnormalities of gait and mobility (R26.89)   Activity Tolerance Patient limited by fatigue   Patient Left in chair;with call bell/phone within reach;with chair alarm set   Nurse Communication Mobility status        Time: 3500-9381 OT Time Calculation (min): 30 min  Charges: OT General Charges $OT Visit: 1 Visit OT Treatments $Self Care/Home Management : 23-37 mins  Lodema Hong, [place]  Pager [phone] Office [place] 08/21/2021, 9:27 AM

## 2021-08-21 NOTE — CV Procedure (Signed)
Procedure: TEE  Sedation: Per anesthesiology  Indication: Endocarditis  Findings:  Please see echo section for full report.   No vegetations noted on this study, stable bioprosthetic aortic valve (opens well).   [name] 08/21/2021 2:09 PM

## 2021-08-22 ENCOUNTER — Other Ambulatory Visit (HOSPITAL_COMMUNITY): Payer: Self-pay

## 2021-08-22 ENCOUNTER — Encounter (HOSPITAL_COMMUNITY): Payer: Self-pay | Admitting: Cardiology

## 2021-08-22 DIAGNOSIS — I5033 Acute on chronic diastolic (congestive) heart failure: Secondary | ICD-10-CM | POA: Diagnosis not present

## 2021-08-22 DIAGNOSIS — B952 Enterococcus as the cause of diseases classified elsewhere: Secondary | ICD-10-CM | POA: Diagnosis not present

## 2021-08-22 DIAGNOSIS — R7881 Bacteremia: Secondary | ICD-10-CM | POA: Diagnosis not present

## 2021-08-22 LAB — GLUCOSE, CAPILLARY
Glucose-Capillary: 88 mg/dL (ref 70–99)
Glucose-Capillary: 83 mg/dL (ref 70–99)
Glucose-Capillary: 86 mg/dL (ref 70–99)

## 2021-08-22 LAB — BASIC METABOLIC PANEL
Anion gap: 10 (ref 5–15)
BUN: 12 mg/dL (ref 8–23)
CO2: 30 mmol/L (ref 22–32)
Calcium: 8.6 mg/dL — ABNORMAL LOW (ref 8.9–10.3)
Chloride: 98 mmol/L (ref 98–111)
Creatinine, Ser: 1.41 mg/dL — ABNORMAL HIGH (ref 0.61–1.24)
GFR, Estimated: 53 mL/min — ABNORMAL LOW (ref >60)
Glucose, Bld: 90 mg/dL (ref 70–99)
Potassium: 4.1 mmol/L (ref 3.5–5.1)
Sodium: 138 mmol/L (ref 135–145)

## 2021-08-22 LAB — HEPARIN LEVEL (UNFRACTIONATED): Heparin Unfractionated: 0.34 IU/mL (ref 0.30–0.70)

## 2021-08-22 MED ORDER — TORSEMIDE 20 MG PO TABS: 60.0000 mg | ORAL_TABLET | Freq: Every day | ORAL | Status: DC

## 2021-08-22 MED ORDER — LINEZOLID 600 MG PO TABS
600.0000 mg | ORAL_TABLET | Freq: Two times a day (BID) | ORAL | 0 refills | Status: AC
  Filled 2021-08-22: qty 14, 7d supply, fill #0

## 2021-08-22 MED ORDER — FLECAINIDE ACETATE 50 MG PO TABS: 75.0000 mg | ORAL_TABLET | Freq: Two times a day (BID) | ORAL | Status: AC

## 2021-08-22 MED ORDER — APIXABAN 5 MG PO TABS
5.0000 mg | ORAL_TABLET | Freq: Two times a day (BID) | ORAL | Status: DC
  Administered 2021-08-22: 5 mg via ORAL
  Filled 2021-08-22: qty 1

## 2021-08-22 MED ORDER — APIXABAN 5 MG PO TABS
5.0000 mg | ORAL_TABLET | Freq: Two times a day (BID) | ORAL | 0 refills | Status: DC
  Filled 2021-08-22: qty 60, 30d supply, fill #0

## 2021-08-22 MED ORDER — LINEZOLID 600 MG PO TABS
600.0000 mg | ORAL_TABLET | Freq: Two times a day (BID) | ORAL | Status: DC
  Administered 2021-08-22: 600 mg via ORAL
  Filled 2021-08-22 (×2): qty 1

## 2021-08-22 NOTE — Care Management Important Message (Signed)
Important Message  Patient Details  Name: Sean Riley MRN: [id number] Date of Birth: [birth year]   Medicare Important Message Given:  Yes     Shelda Altes 08/22/2021, 2:10 PM

## 2021-08-22 NOTE — Progress Notes (Signed)
Occupational Therapy Treatment Patient Details Name: Sean Riley MRN: [id number] DOB: [birth year] Today's Date: 08/22/2021   History of present illness Pt is a 71 yo male admitted with confusion, ocugh and SOB, hypoxia requiring Bipap.  Pt found to have enterococcus bacteremia, endocarditis and diastolic heart failure.  Pt with PMH of COPD on home O2, afib, CHF, AAA repair and HTN   OT comments  Patient received in supine and oxygen has been increased from 6 liters to 8 due to sats dropping last night. Patient was min guard from min assist for supine to sitting on EOB and was min assist for transfer to recliner due to line management. Patient performed grooming seated in chair when breakfast arrived and patient asked to eat. Patient's sats were 94 at rest and decreased to 86 with activity seated or standing. Acute OT to continue to follow.    Recommendations for follow up therapy are one component of a multi-disciplinary discharge planning process, led by the attending physician.  Recommendations may be updated based on patient status, additional functional criteria and insurance authorization.    Follow Up Recommendations  Home health OT    Assistance Recommended at Discharge Frequent or constant Supervision/Assistance  Patient can return home with the following  A little help with bathing/dressing/bathroom;Assistance with cooking/housework;Help with stairs or ramp for entrance   Equipment Recommendations  None recommended by OT    Recommendations for Other Services      Precautions / Restrictions Precautions Precautions: Fall;Other (comment) Precaution Comments: check O2 levels       Mobility Bed Mobility Overal bed mobility: Needs Assistance Bed Mobility: Supine to Sit     Supine to sit: Min guard;HOB elevated     General bed mobility comments: able to get to EOB with min guard for trunk    Transfers Overall transfer level: Needs assistance Equipment used: Rolling  walker (2 wheels) Transfers: Sit to/from Stand;Bed to chair/wheelchair/BSC Sit to Stand: Min assist;Min guard Stand pivot transfers: Min guard;Min assist         General transfer comment: min assist due to lines     Balance Overall balance assessment: Needs assistance Sitting-balance support: Feet supported Sitting balance-[name]: Good     Standing balance support: Bilateral upper extremity supported;During functional activity;Reliant on assistive device for balance Standing balance-[name]: Poor Standing balance comment: reliant on walker                           ADL either performed or assessed with clinical judgement   ADL Overall ADL's : Needs assistance/impaired Eating/Feeding: Set up;Sitting Eating/Feeding Details (indicate cue type and reason): required assistance to open containers Grooming: Wash/dry hands;Wash/dry face;Sitting;Set up Grooming Details (indicate cue type and reason): performed seated in recliner                                    Extremity/Trunk Assessment              Vision       Perception     Praxis      Cognition Arousal/Alertness: Awake/alert Behavior During Therapy: WFL for tasks assessed/performed Overall Cognitive Status: Within Functional Limits for tasks assessed Area of Impairment: Problem solving                             Problem Solving:  Slow processing;Decreased initiation General Comments: oriented x3 this am          Exercises     Shoulder Instructions       General Comments now on 8L of oxygen with sats 86-94    Pertinent Vitals/ Pain       Pain Assessment: Faces Faces Pain Scale: Hurts a little bit Pain Location: back Pain Descriptors / Indicators: Aching Pain Intervention(s): Limited activity within patient's tolerance;Monitored during session;Repositioned  Home Living                                          Prior  Functioning/Environment              Frequency  Min 2X/week        Progress Toward Goals  OT Goals(current goals can now be found in the care plan section)  Progress towards OT goals: Progressing toward goals  Acute Rehab OT Goals Patient Stated Goal: go home OT Goal Formulation: With patient Time For Goal Achievement: 09/03/21 Potential to Achieve Goals: Good ADL Goals Pt Will Perform Lower Body Bathing: with supervision;sit to/from stand Pt Will Perform Lower Body Dressing: sit to/from stand;with supervision Pt Will Transfer to Toilet: with supervision;ambulating Pt Will Perform Tub/Shower Transfer: Shower transfer;with supervision;ambulating;rolling walker;shower seat Additional ADL Goal #1: pt wil state two things he can do at home to conserve energy during adls with no cues.  Plan Discharge plan remains appropriate    Co-evaluation                 AM-PAC OT "6 Clicks" Daily Activity     Outcome Measure   Help from another person eating meals?: None Help from another person taking care of personal grooming?: None Help from another person toileting, which includes using toliet, bedpan, or urinal?: A Little Help from another person bathing (including washing, rinsing, drying)?: A Little Help from another person to put on and taking off regular upper body clothing?: A Little Help from another person to put on and taking off regular lower body clothing?: A Lot 6 Click Score: 19    End of Session Equipment Utilized During Treatment: Rolling walker (2 wheels);Oxygen  OT Visit Diagnosis: Unsteadiness on feet (R26.81);Other abnormalities of gait and mobility (R26.89)   Activity Tolerance Patient limited by fatigue   Patient Left in chair;with call bell/phone within reach;with chair alarm set   Nurse Communication Mobility status        Time: 3662-9476 OT Time Calculation (min): 20 min  Charges: OT General Charges $OT Visit: 1 Visit OT  Treatments $Therapeutic Activity: 8-22 mins  Lodema Hong, [place]  Pager [phone] Office [place] 08/22/2021, 8:33 AM

## 2021-08-22 NOTE — Progress Notes (Signed)
ANTICOAGULATION CONSULT NOTE - Follow Up Consult  Pharmacy Consult for heparin Indication: atrial fibrillation  Labs: Recent Labs    08/20/21 0220 08/20/21 1027 08/21/21 0119 08/22/21 0042  HGB 10.9*  --  10.7*  --   HCT 35.3*  --  35.0*  --   PLT 265  --  291  --   HEPARINUNFRC 0.21* 0.47 0.51 0.34  CREATININE 1.23  --  1.20 1.41*     Assessment: 71yo male now therapeutic on heparin for Afib while Eliquis on hold  Goal of Therapy:  Heparin level 0.3-0.7 units/ml   Plan:  Continue heparin at current rate Follow up AM labs Follow up for Eliquis resumption?  Thank you Anette Guarneri, PharmD 08/22/2021,8:18 AM

## 2021-08-22 NOTE — Progress Notes (Signed)
PT Cancellation Note  Patient Details Name: Sean Riley MRN: [id number] DOB: [birth year]   Cancelled Treatment:    Reason Eval/Treat Not Completed: Fatigue/lethargy limiting ability to participate;Other (comment).  Refusing therapy, reporting he is not going to need it as he is going home.  Talked with him about his plans, and pt is sure that he is not going to need PT today.  Follow up as time and pt allow.   Ramond Dial 08/22/2021, 2:06 PM  Mee Hives, PT PhD Acute Rehab Dept. Number: [place]

## 2021-08-22 NOTE — TOC Benefit Eligibility Note (Signed)
Transition of Care [facility]) Benefit Eligibility Note    Patient Details  Name: Sean Riley MRN: [id number] Date of Birth: 04-29-1951   Medication/Dose: Arne Cleveland  5 MG BID  Covered?: Yes     Prescription Coverage Preferred Pharmacy: [facility],   [place] with Person/Company/Phone Number:: ALESIA  @  [employer] #  [phone]     Prior Approval: Yes [phone])  Deductible:  (NO DEDUCTIBLE  / NO OUT-OF-POCKET)  Additional Notes: CO-PAY- WILL BE $ 45.00 WHEN  APPROVED    Memory Argue Phone Number: 08/22/2021, 3:48 PM

## 2021-08-22 NOTE — Anesthesia Postprocedure Evaluation (Signed)
Anesthesia Post Note  Patient: Sean Riley  Procedure(s) Performed: TRANSESOPHAGEAL ECHOCARDIOGRAM (TEE)     Patient location during evaluation: Endoscopy Anesthesia Type: MAC Level of consciousness: awake and alert Pain management: pain level controlled Vital Signs Assessment: post-procedure vital signs reviewed and stable Respiratory status: spontaneous breathing, nonlabored ventilation, respiratory function stable and patient connected to nasal cannula oxygen Cardiovascular status: stable and blood pressure returned to baseline Postop Assessment: no apparent nausea or vomiting Anesthetic complications: no   No notable events documented.  Last Vitals:  Vitals:   08/22/21 0351 08/22/21 0715  BP: 127/61 (!) 150/71  Pulse: 68 69  Resp: (!) 22 20  Temp: 36.8 C 36.6 C  SpO2: 91% 95%    Last Pain:  Vitals:   08/22/21 0715  TempSrc: Oral  PainSc:    Pain Goal:                   Catalina Gravel

## 2021-08-22 NOTE — TOC CM/SW Note (Addendum)
HF TOC CM contacted [employer] to make aware pt's liter needs have increased. Pt has a 5 Liter home concentrator and will need 10 Liter tank. Will fax orders and they will exchange out pt's tank tonight. Updated wife, [name]. Contacted [facility] to make aware dc home. Wife wanted to know when pt's follow up with Dr Aundra Dubin. CM notified [facility] RN, office will arrange appt and contact wife.   [place], Heart Failure TOC CM [phone]

## 2021-08-22 NOTE — Discharge Summary (Signed)
Physician Discharge Summary  Sean Riley UTM:[id number] DOB: 1950-11-11 DOA: 08/15/2021  PCP: Colon Branch, MD  Admit date: 08/15/2021 Discharge date: 08/22/2021  Admitted From: Home Disposition:  Home  Discharge Condition:Stable CODE STATUS:FULL Diet recommendation: Heart Healthy    Brief/Interim Summary:  Patient is a 71 year old male with history of COPD, on home oxygen, A. fib on Eliquis,/volume overload., ascending aortic dissection s/p repair, atrial valve replacement, hypertension, GI bleed, GERD who presented from home with complaints of confusion, cough, shortness of breath.  On presentation he was found to be hypoxic.  He had to put on BiPAP.  He was admitted under ICU service.  Found to have enterococcal bacteremia.  ID following.  Cardiology consulted for severe congestive heart failure/volume overload.HF team following.  TEE did not show any vegetation. on oxygen today at 5L /min .  Medically stable for discharge to home today with oral antibiotics.  Following problems were addressed during hospitalization:   Severe sepsis with septic shock: Secondary to Enterococcus faecalis bacteremia.  Source is unclear.  Has history of bioprosthetic aortic valve.  ID were following. He was  on ampicillin, ceftriaxone.  No valve vegetation as per TEE..  Remains afebrile, no leukocytosis.  CT abdomen/pelvis did not show any obvious source but showed diverticulosis.  Repeat blood cultures have not shown any growth.  Continue linezolid on discharge   Acute on chronic hypoxic respiratory failure: Multifactorial secondary to COPD, CHF.  Uses 3 to 4 L of oxygen per minute at home.  Around baseline, needed 5 L on ambulation .chest x-ray on 1/8 showed emphysema.  Has chronic bilateral pleural effusion.     Acute on chronic diastolic congestive heart failure: Started on torsemide 60 mg daily . Heart failure team following..  Echo done here on 08/16/2021 showed EF of 55 to 60%, moderately elevated  pulmonary artery pressure, moderately enlarged right ventricle   AKI on CKD stage IIIb: Baseline creatinine ranged from 1.3-1.5.  Currently kidney function at baseline.   Acute metabolic encephalopathy: Most likely secondary to sepsis.  Currently he is alert and oriented.   COPD: Currently without exacerbation.  Continue bronchodilators as needed.  Continue supplemental oxygen at home   Paroxysmal A. fib: On Eliquis for anticoagulation.   On metoprolol and flecainide.  Has history of aortic valve replacement and has history of thoracic aortic aneurysm repair in 2018.  Cardiology aware following.   Normocytic anemia: Currently hemoglobin stable.  No signs of bleeding   History of sleep apnea: On CPAP   Deconditioning/debility: PT/OT recommend home health         Discharge Diagnoses:  Principal Problem:   Enterococcal bacteremia Active Problems:   Atrial fibrillation (HCC)   COPD (chronic obstructive pulmonary disease) (HCC)   Normocytic anemia   Acute on chronic respiratory failure with hypoxia (HCC)   Chronic diastolic CHF (congestive heart failure) (HCC)   Severe sepsis with septic shock (HCC)   AKI (acute kidney injury) ([place])   Pressure injury of skin   Acute metabolic encephalopathy   Prosthetic valve endocarditis (HCC)   Acute on chronic diastolic CHF (congestive heart failure) ([place])    Discharge Instructions  Discharge Instructions     Diet - low sodium heart healthy   Complete by: As directed    Discharge instructions   Complete by: As directed    1)Please take prescribed medication as instructed 2)Follow up with your PCP in a week 3)You will be called by cardiology and infectious disease as an outpatient  for follow-up   Increase activity slowly   Complete by: As directed    No wound care   Complete by: As directed       Allergies as of 08/22/2021       Reactions   Simvastatin Other (See Comments)   Mental status changes   Celecoxib Other (See  Comments)   GI UPSET AND INFLAMMATION   Codeine Other (See Comments)   GI UPSET AND INFLAMMATION   Nsaids Other (See Comments)   GI UPSET AND INFLAMMATION (can tolerate via IV)   Tape Other (See Comments)   Medical tape and Band-Aids PULL OFF THE SKIN; please use Coban wrap   Cephalexin Itching, Rash   Latex Rash        Medication List     TAKE these medications    albuterol 108 (90 Base) MCG/ACT inhaler Commonly known as: ProAir HFA Inhale 2 puffs into the lungs every 4 (four) hours as needed for wheezing or shortness of breath.   ALPRAZolam 1 MG tablet Commonly known as: XANAX TAKE 1 TABLET BY MOUTH AT BEDTIME.   apixaban 5 MG Tabs tablet Commonly known as: ELIQUIS Take 1 tablet (5 mg total) by mouth 2 (two) times daily.   bisacodyl 5 MG EC tablet Commonly known as: DULCOLAX Take 5 mg by mouth daily as needed for moderate constipation.   Breztri Aerosphere 160-9-4.8 MCG/ACT Aero Generic drug: Budeson-Glycopyrrol-Formoterol Inhale 2 puffs into the lungs in the morning and at bedtime.   escitalopram 10 MG tablet Commonly known as: LEXAPRO TAKE 1 TABLET DAILY   esomeprazole 40 MG capsule Commonly known as: NexIUM Take 1 capsule (40 mg total) by mouth daily before breakfast.   flecainide 50 MG tablet Commonly known as: TAMBOCOR Take 1.5 tablets (75 mg total) by mouth 2 (two) times daily.   fluticasone 50 MCG/ACT nasal spray Commonly known as: FLONASE Place 1 spray into both nostrils 2 (two) times daily as needed for allergies. Use one spray in each nostril twice daily.   hydroxypropyl methylcellulose / hypromellose 2.5 % ophthalmic solution Commonly known as: ISOPTO TEARS / GONIOVISC Place 1-2 drops into both eyes 3 (three) times daily as needed (dry/irritated eyes).   ipratropium-albuterol 0.5-2.5 (3) MG/3ML Soln Commonly known as: DUONEB Take 3 mLs by nebulization every 4 (four) hours as needed. What changed: reasons to take this   Jardiance 10 MG Tabs  tablet Generic drug: empagliflozin TAKE 1 TABLET DAILY BEFORE BREAKFAST What changed:  how much to take when to take this   linezolid 600 MG tablet Commonly known as: ZYVOX Take 1 tablet (600 mg total) by mouth every 12 (twelve) hours for 7 days.   magnesium hydroxide 400 MG/5ML suspension Commonly known as: MILK OF MAGNESIA Take 7.5 mLs by mouth daily as needed (stomach cramps/constipation).   metoprolol tartrate 25 MG tablet Commonly known as: LOPRESSOR Take 0.5 tablets (12.5 mg total) by mouth 2 (two) times daily. Please make yearly appt with Dr. Caryl Comes for January 2022 for future refills. Thank you 1st attempt   OXYGEN Inhale 3-4 L/min into the lungs continuous.   potassium chloride SA 20 MEQ tablet Commonly known as: KLOR-CON M Take 40 mEq by mouth at bedtime. What changed: Another medication with the same name was removed. Continue taking this medication, and follow the directions you see here.   rosuvastatin 5 MG tablet Commonly known as: CRESTOR TAKE 1 TABLET DAILY What changed: when to take this   torsemide 20 MG tablet Commonly known as:  DEMADEX Take 3 tablets (60 mg total) by mouth daily.   triamcinolone cream 0.5 % Commonly known as: KENALOG Apply 1 application topically 3 (three) times daily.               Durable Medical Equipment  (From admission, onward)           Start     Ordered   08/22/21 1310  For home use only DME oxygen  Once       Question Answer Comment  Length of Need Lifetime   Mode or (Route) Nasal cannula   Liters per Minute 5   Frequency Continuous (stationary and portable oxygen unit needed)   Oxygen delivery system Gas      08/22/21 1309            Follow-up Information     Colon Branch, MD. Schedule an appointment as soon as possible for a visit in 1 week(s).   Specialty: Internal Medicine Contact information: [phone] [address] [employer] [address] [place] [address] [phone]                 Allergies  Allergen Reactions   Simvastatin Other (See Comments)    Mental status changes   Celecoxib Other (See Comments)    GI UPSET AND INFLAMMATION   Codeine Other (See Comments)    GI UPSET AND INFLAMMATION   Nsaids Other (See Comments)    GI UPSET AND INFLAMMATION (can tolerate via IV)   Tape Other (See Comments)    Medical tape and Band-Aids PULL OFF THE SKIN; please use Coban wrap   Cephalexin Itching and Rash   Latex Rash    Consultations: Cardiology, ID  Procedures/Studies: DG Chest 1 View  Result Date: 08/17/2021 CLINICAL DATA:  Edema EXAM: CHEST  1 VIEW COMPARISON:  Chest x-ray 08/15/2021, CT 08/16/2021, radiograph 04/15/2021 FINDINGS: Post sternotomy changes with aortic stent graft. Emphysema and chronic interstitial disease. Left-sided pleural effusion, similar right pleural effusion, effusions appears slightly loculated. Peripheral parenchymal opacities are also unchanged and could be due to chronic scarring. Cardiomegaly with vascular congestion. IMPRESSION: 1. Chronic bilateral effusions with slight increased pleural effusion on the left compared to prior. 2. Cardiomegaly with slight central congestion 3. Emphysema with chronic interstitial disease. Electronically Signed   By: Donavan Foil M.D.   On: 08/17/2021 16:56   DG Chest 2 View  Result Date: 08/15/2021 CLINICAL DATA:  Fever, malaise for 2 days, cough EXAM: CHEST - 2 VIEW COMPARISON:  Chest radiograph 04/15/2021 FINDINGS: Image quality is degraded by patient positioning, with the head overlying portions of the upper lobes. The cardiomediastinal silhouette is grossly stable, with unchanged cardiomegaly. 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 There are bilateral pleural effusions, overall similar to prior studies. There are patchy opacities in the lung bases, improved on the right but worsened on the left since 04/15/2021. There is no definite pneumothorax. There is  no acute osseous abnormality. IMPRESSION: Overall unchanged bilateral pleural effusions since 04/15/2021, but with worsened aeration in the left base. Findings could reflect left lower lobe pneumonia superimposed on chronic changes in the correct clinical setting. Electronically Signed   By: Valetta Mole M.D.   On: 08/15/2021 17:42   CT HEAD WO CONTRAST (5MM)  Result Date: 08/16/2021 CLINICAL DATA:  Altered mental status, nontraumatic (Ped 0-17y). Sepsis. EXAM: CT HEAD WITHOUT CONTRAST TECHNIQUE: Contiguous axial images were obtained from the base of the skull through the vertex  without intravenous contrast. COMPARISON:  None. FINDINGS: Brain: There is atrophy and chronic small vessel disease changes. No acute intracranial abnormality. Specifically, no hemorrhage, hydrocephalus, mass lesion, acute infarction, or significant intracranial injury. Vascular: No hyperdense vessel or unexpected calcification. Skull: No acute calvarial abnormality. Sinuses/Orbits: No acute findings Other: None IMPRESSION: Atrophy, chronic microvascular disease. No acute intracranial abnormality. Electronically Signed   By: Rolm Baptise M.D.   On: 08/16/2021 01:12   CT CHEST WO CONTRAST  Result Date: 08/16/2021 CLINICAL DATA:  Sepsis, respiratory distress EXAM: CT CHEST WITHOUT CONTRAST TECHNIQUE: Multidetector CT imaging of the chest was performed following the standard protocol without IV contrast. COMPARISON:  01/15/2021 FINDINGS: Cardiovascular: Postoperative changes from aortic root and arch repair. Stable descending thoracic aortic aneurysm, 5.1 cm. Heart is normal size. Coronary artery calcifications. Mediastinum/Nodes: Mildly prominent borderline sized mediastinal lymph nodes. Right paratracheal lymph node has a short axis diameter of 13 mm, stable. No axillary or visible hilar adenopathy. Lungs/Pleura: Small bilateral pleural effusions, similar to prior study. Peripheral airspace opacities in both lower lobes as well as right  middle lobe and lingula, favor rounded atelectasis. Overall no change since prior study. Moderate centrilobular and paraseptal emphysema. Upper Abdomen: No acute findings Musculoskeletal: Chest wall soft tissues are unremarkable. No acute bony abnormality. IMPRESSION: Stable small bilateral pleural effusions with peripheral somewhat rounded airspace opacities in both mid and lower lungs, favor rounded atelectasis and/or scarring. Postoperative changes from aortic repair related to aortic dissection. 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 Coronary artery disease. No definite acute process. Aortic Atherosclerosis (ICD10-I70.0) and Emphysema (ICD10-J43.9). Electronically Signed   By: Rolm Baptise M.D.   On: 08/16/2021 01:16   CT ABDOMEN PELVIS W CONTRAST  Result Date: 08/17/2021 CLINICAL DATA:  Sepsis EXAM: CT ABDOMEN AND PELVIS WITH CONTRAST TECHNIQUE: Multidetector CT imaging of the abdomen and pelvis was performed using the standard protocol following bolus administration of intravenous contrast. CONTRAST:  171mL OMNIPAQUE IOHEXOL 300 MG/ML  SOLN COMPARISON:  01/29/2021 FINDINGS: Lower chest: Cardiomegaly. Unchanged small, loculated bilateral pleural effusions associated rounded atelectasis. Hepatobiliary: No solid liver abnormality is seen. Gallstones and sludge contracted in the gallbladder. Gallbladder wall thickening, or biliary dilatation. Pancreas: Unremarkable. No pancreatic ductal dilatation or surrounding inflammatory changes. Spleen: Normal in size without significant abnormality. Adrenals/Urinary Tract: Adrenal glands are unremarkable. Kidneys are normal, without renal calculi, solid lesion, or hydronephrosis. Foley catheter in the urinary bladder. Thickening of the urinary bladder wall, likely related to chronic outlet obstruction. Stomach/Bowel: Stomach is within normal limits. Appendix is not clearly visualized. No evidence of bowel wall thickening, distention, or inflammatory changes.  Descending and sigmoid diverticulosis. Vascular/Lymphatic: Aortic atherosclerosis. 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 The abdominal aorta measures up to 4.7 x 4.0 cm in maximum caliber near the diaphragmatic hiatus. No enlarged abdominal or pelvic lymph nodes. Reproductive: Prostatomegaly. Other: Anasarca.  No abdominopelvic ascites. Musculoskeletal: No acute or significant osseous findings. IMPRESSION: 1. No acute CT findings of the abdomen or pelvis to explain sepsis. 2. Gallstones and sludge contracted in the gallbladder. No CT evidence of acute cholecystitis. 3. Descending and sigmoid diverticulosis without evidence of acute diverticulitis. 4. 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 The abdominal aorta measures up to 4.7 x 4.0 cm in maximum caliber near the diaphragmatic hiatus. Aortic Atherosclerosis (ICD10-I70.0). Electronically Signed  By: Delanna Ahmadi M.D.   On: 08/17/2021 16:25   ECHOCARDIOGRAM COMPLETE  Result Date: 08/16/2021    ECHOCARDIOGRAM REPORT   Patient Name:   Sean Riley Date of Exam: 08/16/2021 Medical Rec #:  [id number]        Height:       73.0 in Accession #:    [id number]       Weight:       191.4 lb Date of Birth:  1950/11/27        BSA:          2.112 m Patient Age:    21 years         BP:           105/49 mmHg Patient Gender: M                HR:           64 bpm. Exam Location:  Inpatient Procedure: 2D Echo, Cardiac Doppler, Color Doppler and Intracardiac            Opacification Agent Indications:    Atrial fibrillation  History:        Patient has prior history of Echocardiogram examinations, most                 recent 09/06/2020. CHF, COPD, Aortic Valve Disease; Risk                 Factors:Hypertension and Dyslipidemia.  25 mm Bovine valve in                 aortic position. Procedure date 03/23/17. GERD. Hx ascending                 aortic dissection, s/p repair.  Sonographer:    Clayton Lefort RDCS (AE) Referring Phys: [id number] Estill Cotta  Sonographer Comments: Technically difficult study due to poor echo windows. Image acquisition challenging due to COPD. IMPRESSIONS  1. Basal septal hypokinesis . Left ventricular ejection fraction, by estimation, is 55 to 60%. The left ventricle has normal function. The left ventricular internal cavity size was mildly dilated. There is mild left ventricular hypertrophy.  2. Right ventricular systolic function is low normal. The right ventricular size is moderately enlarged. There is moderately elevated pulmonary artery systolic pressure.  3. Right atrial size was severely dilated.  4. Mild mitral valve regurgitation.  5. Tricuspid valve regurgitation is moderate.  6. Poor acoustic windows limit study. 25 mm Edwards bovine valve present (03/23/2017) Difficult to see well. Peak and mean gradients through the valve are 5 and 2 mm Hg respectively Previous echo in May 2022, peak and mean gradients were 34 and 18 mm Hg respectively. Poor windows from current study, may not have attained optimal angle to measure gradients. Follow over time. . The aortic valve has been repaired/replaced. Aortic valve regurgitation is not visualized. Aortic valve sclerosis/calcification is present, without any evidence of aortic stenosis.  7. The inferior vena cava is dilated in size with <50% respiratory variability, suggesting right atrial pressure of 15 mmHg. FINDINGS  Left Ventricle: Basal septal hypokinesis. Left ventricular ejection fraction, by estimation, is 55 to 60%. The left ventricle has normal function. Definity contrast agent was given IV to delineate the left ventricular endocardial borders. The left ventricular internal cavity size was mildly dilated. There is mild left ventricular hypertrophy. Right  Ventricle: The right ventricular size is moderately enlarged. Right vetricular wall thickness was not assessed. Right ventricular systolic function  is low normal. There is moderately elevated pulmonary artery systolic pressure. The tricuspid regurgitant velocity is 3.18 m/s, and with an assumed right atrial pressure of 15 mmHg, the estimated right ventricular systolic pressure is 44.3 mmHg. Left Atrium: Left atrial size was normal in size. Right Atrium: Right atrial size was severely dilated. Pericardium: There is no evidence of pericardial effusion. Mitral Valve: There is mild thickening of the mitral valve leaflet(s). Mild mitral annular calcification. Mild mitral valve regurgitation. Tricuspid Valve: The tricuspid valve is grossly normal. Tricuspid valve regurgitation is moderate. Aortic Valve: Poor acoustic windows limit study. 25 mm Edwards bovine valve present (03/23/2017) Difficult to see well. Peak and mean gradients through the valve are 5 and 2 mm Hg respectively Previous echo in May 2022, peak and mean gradients were 34 and  18 mm Hg respectively. Poor windows from current study, may not have attained optimal angle to measure gradients. Follow over time. The aortic valve has been repaired/replaced. Aortic valve regurgitation is not visualized. Aortic valve sclerosis/calcification is present, without any evidence of aortic stenosis. Aortic valve mean gradient measures 2.0 mmHg. Aortic valve peak gradient measures 4.6 mmHg. Aortic valve area, by VTI measures 5.04 cm. Pulmonic Valve: The pulmonic valve was not well visualized. Aorta: The aortic root and ascending aorta are structurally normal, with no evidence of dilitation. Venous: The inferior vena cava is dilated in size with less than 50% respiratory variability, suggesting right atrial pressure of 15 mmHg. IAS/Shunts: No atrial level shunt detected by color flow Doppler.  LEFT VENTRICLE PLAX 2D LVIDd:         5.40 cm LVIDs:         4.10 cm LV PW:          1.20 cm LV IVS:        1.40 cm LVOT diam:     2.50 cm LV SV:         76 LV SV Index:   36 LVOT Area:     4.91 cm  IVC IVC diam: 2.60 cm LEFT ATRIUM           Index        RIGHT ATRIUM           Index LA diam:      3.10 cm 1.47 cm/m   RA Area:     32.50 cm LA Vol (A4C): 64.7 ml 30.64 ml/m  RA Volume:   115.00 ml 54.46 ml/m  AORTIC VALVE AV Area (Vmax):    3.43 cm AV Area (Vmean):   3.28 cm AV Area (VTI):     5.04 cm AV Vmax:           107.00 cm/s AV Vmean:          71.900 cm/s AV VTI:            0.151 m AV Peak Grad:      4.6 mmHg AV Mean Grad:      2.0 mmHg LVOT Vmax:         74.80 cm/s LVOT Vmean:        48.100 cm/s LVOT VTI:          0.155 m LVOT/AV VTI ratio: 1.03  AORTA Ao Root diam: 3.10 cm Ao Asc diam:  3.60 cm TRICUSPID VALVE TR Peak grad:   40.4 mmHg TR Vmax:        318.00 cm/s  SHUNTS Systemic VTI:  0.16 m Systemic Diam: 2.50 cm Dorris Carnes MD Electronically signed by  Dorris Carnes MD Signature Date/Time: 08/16/2021/12:56:09 PM    Final    ECHO TEE  Result Date: 08/21/2021    TRANSESOPHOGEAL ECHO REPORT   Patient Name:   Sean Riley Date of Exam: 08/21/2021 Medical Rec #:  [id number]        Height:       73.0 in Accession #:    [id number]       Weight:       196.9 lb Date of Birth:  [birth year]        BSA:          2.137 m Patient Age:    57 years         BP:           143/67 mmHg Patient Gender: M                HR:           70 bpm. Exam Location:  Inpatient Procedure: Transesophageal Echo, Color Doppler and Cardiac Doppler Indications:     Bacteremia  History:         Patient has prior history of Echocardiogram examinations, most                  recent 08/16/2021. CHF, COPD, Arrythmias:Atrial Fibrillation;                  Risk Factors:Hypertension and Dyslipidemia. 03/23/17 [place]                  procedure with 40mm Edwards Magna Ease Aortic Valve                  Bioprosthetic and 67mm Gelweave at Sinuses of Valsalva.  Sonographer:     Raquel Sarna Senior RDCS Referring Phys:  [place] Diagnosing Phys: Franki Monte PROCEDURE: After discussion of the risks and benefits of a TEE, an informed consent was obtained from the patient. The transesophogeal probe was passed without difficulty through the esophogus of the patient. Sedation performed by different physician. The patient was monitored while under deep sedation. Anesthestetic sedation was provided intravenously by Anesthesiology: 146mg  of Propofol. The patient developed no complications during the procedure. IMPRESSIONS  1. Left ventricular ejection fraction, by estimation, is 55 to 60%. The left ventricle has normal function. The left ventricle has no regional wall motion abnormalities.  2. Right ventricular systolic function is mildly reduced. The right ventricular size is mildly enlarged.  3. Left atrial size was mildly dilated. No left atrial/left atrial appendage thrombus was detected.  4. Right atrial size was mildly dilated.  5. The mitral valve is abnormal. Moderate posteriorly-directed mitral valve regurgitation. No flow reversal in the pulmonary vein systolic doppler pattern. No evidence of mitral stenosis. No mitral valve vegetation.  6. No tricuspid valve vegetation. Peak RV-RA gradient 17 mmHg. The tricuspid valve is abnormal. Tricuspid valve regurgitation is moderate.  7. 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 The valve opens normally. Trivial aortic insufficiency.  8. No PFO or ASD by color doppler.  9. No pulmonary valve vegetation. 10. Stable repaired ascending aorta. Aortic root/ascending aorta has been repaired/replaced. FINDINGS  Left Ventricle: Left ventricular ejection fraction, by estimation, is 55 to 60%. The left ventricle has normal function. The left ventricle has no regional wall motion abnormalities. The left ventricular internal cavity size was normal in size. There is  no left ventricular hypertrophy. Right Ventricle: The right ventricular size is mildly enlarged. No  increase in right  ventricular wall thickness. Right ventricular systolic function is mildly reduced. Left Atrium: Left atrial size was mildly dilated. No left atrial/left atrial appendage thrombus was detected. Right Atrium: Right atrial size was mildly dilated. Pericardium: There is no evidence of pericardial effusion. Mitral Valve: The mitral valve is abnormal. Moderate mitral valve regurgitation. No evidence of mitral valve stenosis. Tricuspid Valve: No tricuspid valve vegetation. Peak RV-RA gradient 17 mmHg. The tricuspid valve is abnormal. Tricuspid valve regurgitation is moderate. Aortic Valve: There is a bioprosthetic aortic valve with no vegetation. The valve opens normally. Trivial aortic insufficiency. The aortic valve has been repaired/replaced. Aortic valve regurgitation is trivial. Pulmonic Valve: No pulmonary valve vegetation. The pulmonic valve was normal in structure. Pulmonic valve regurgitation is not visualized. 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 IAS/Shunts: No PFO or ASD by color doppler. [employer] Electronically signed by Franki Monte Signature Date/Time: 08/21/2021/3:57:59 PM    Final       Subjective:  Patient seen and examined at the bedside this morning.  Hemodynamically stable for discharge Discharge Exam: Vitals:   08/22/21 0841 08/22/21 1200  BP:  122/70  Pulse: 86 60  Resp: (!) 24 (!) 23  Temp:  97.7 F (36.5 C)  SpO2: 97% 93%   Vitals:   08/22/21 0351 08/22/21 0715 08/22/21 0841 08/22/21 1200  BP: 127/61 (!) 150/71  122/70  Pulse: 68 69 86 60  Resp: (!) 22 20 (!) 24 (!) 23  Temp: 98.2 F (36.8 C) 97.8 F (36.6 C)  97.7 F (36.5 C)  TempSrc: Axillary Oral  Oral  SpO2: 91% 95% 97% 93%  Weight: 89.5 kg     Height:        General: Pt is alert, awake, not in acute distress Cardiovascular: RRR, S1/S2 +, no rubs, no gallops Respiratory: Diminished air sounds on bases, no wheezing, no rhonchi Abdominal:  Soft, NT, ND, bowel sounds + Extremities: no edema, no cyanosis    The results of significant diagnostics from this hospitalization (including imaging, microbiology, ancillary and laboratory) are listed below for reference.     Microbiology: Recent Results (from the past 240 hour(s))  Culture, blood (Routine x 2)     Status: Abnormal   Collection Time: 08/15/21  4:50 PM   Specimen: BLOOD  Result Value Ref Range Status   Specimen Description BLOOD SITE NOT SPECIFIED  Final   Special Requests   Final    BOTTLES DRAWN AEROBIC AND ANAEROBIC Blood Culture results may not be optimal due to an excessive volume of blood received in culture bottles   Culture  Setup Time   Final    GRAM POSITIVE COCCI IN BOTH AEROBIC AND ANAEROBIC BOTTLES CRITICAL VALUE NOTED.  VALUE IS CONSISTENT WITH PREVIOUSLY REPORTED AND CALLED VALUE.    Culture (A)  Final    ENTEROCOCCUS FAECALIS SUSCEPTIBILITIES PERFORMED ON PREVIOUS CULTURE WITHIN THE LAST 5 DAYS. Performed at [facility], [place] [address], [place], [place] [address]    Report Status 08/18/2021 FINAL  Final  Resp Panel by RT-PCR (Flu A&B, Covid) Peripheral     Status: None   Collection Time: 08/15/21  4:51 PM   Specimen: Peripheral; Nasopharyngeal(NP) swabs in vial transport medium  Result Value Ref Range Status   SARS Coronavirus 2 by RT PCR NEGATIVE NEGATIVE Final    Comment: (NOTE) SARS-CoV-2 target nucleic acids are NOT DETECTED.  The SARS-CoV-2 RNA is generally detectable in upper respiratory specimens during  the acute phase of infection. The lowest concentration of SARS-CoV-2 viral copies this assay can detect is 138 copies/mL. A negative result does not preclude SARS-Cov-2 infection and should not be used as the sole basis for treatment or other patient management decisions. A negative result may occur with  improper specimen collection/handling, submission of specimen other than nasopharyngeal swab, presence of viral mutation(s)  within the areas targeted by this assay, and inadequate number of viral copies(<138 copies/mL). A negative result must be combined with clinical observations, patient history, and epidemiological information. The expected result is Negative.  Fact Sheet for Patients:  EntrepreneurPulse.com.au  Fact Sheet for Healthcare Providers:  IncredibleEmployment.be  This test is no t yet approved or cleared by the [place] FDA and  has been authorized for detection and/or diagnosis of SARS-CoV-2 by FDA under an Emergency Use Authorization (EUA). This EUA will remain  in effect (meaning this test can be used) for the duration of the COVID-19 declaration under Section 564(b)(1) of the Act, 21 U.S.C.section 360bbb-3(b)(1), unless the authorization is terminated  or revoked sooner.       Influenza A by PCR NEGATIVE NEGATIVE Final   Influenza B by PCR NEGATIVE NEGATIVE Final    Comment: (NOTE) The Xpert Xpress SARS-CoV-2/FLU/RSV plus assay is intended as an aid in the diagnosis of influenza from Nasopharyngeal swab specimens and should not be used as a sole basis for treatment. Nasal washings and aspirates are unacceptable for Xpert Xpress SARS-CoV-2/FLU/RSV testing.  Fact Sheet for Patients: EntrepreneurPulse.com.au  Fact Sheet for Healthcare Providers: IncredibleEmployment.be  This test is not yet approved or cleared by the [place] FDA and has been authorized for detection and/or diagnosis of SARS-CoV-2 by FDA under an Emergency Use Authorization (EUA). This EUA will remain in effect (meaning this test can be used) for the duration of the COVID-19 declaration under Section 564(b)(1) of the Act, 21 U.S.C. section 360bbb-3(b)(1), unless the authorization is terminated or revoked.  Performed at [facility], [place] [address] [place], [place] [address]   Culture, blood (Routine x 2)     Status:  Abnormal   Collection Time: 08/15/21  5:02 PM   Specimen: BLOOD  Result Value Ref Range Status   Specimen Description BLOOD SITE NOT SPECIFIED  Final   Special Requests   Final    BOTTLES DRAWN AEROBIC AND ANAEROBIC Blood Culture results may not be optimal due to an excessive volume of blood received in culture bottles   Culture  Setup Time   Final    GRAM POSITIVE COCCI IN CHAINS IN BOTH AEROBIC AND ANAEROBIC BOTTLES CRITICAL RESULT CALLED TO, READ BACK BY AND VERIFIED WITHJiles Garter [facility] PHARMD @0817  08/16/21 EB Performed at [facility], [place] [address] [place],  [address]    Culture ENTEROCOCCUS FAECALIS (A)  Final   Report Status 08/18/2021 FINAL  Final   Organism ID, Bacteria ENTEROCOCCUS FAECALIS  Final      Susceptibility   Enterococcus faecalis - MIC*    AMPICILLIN <=2 SENSITIVE Sensitive     VANCOMYCIN 1 SENSITIVE Sensitive     GENTAMICIN SYNERGY SENSITIVE Sensitive     * ENTEROCOCCUS FAECALIS  Blood Culture ID Panel (Reflexed)     Status: Abnormal   Collection Time: 08/15/21  5:02 PM  Result Value Ref Range Status   Enterococcus faecalis DETECTED (A) NOT DETECTED Final    Comment: CRITICAL RESULT CALLED TO, READ BACK BY AND VERIFIED WITHJiles Garter [facility] PHARMD @0817  08/16/21  EB    Enterococcus Faecium NOT DETECTED NOT DETECTED Final   Listeria monocytogenes NOT DETECTED NOT DETECTED Final   Staphylococcus species NOT DETECTED NOT DETECTED Final   Staphylococcus aureus (BCID) NOT DETECTED NOT DETECTED Final   Staphylococcus epidermidis NOT DETECTED NOT DETECTED Final   Staphylococcus lugdunensis NOT DETECTED NOT DETECTED Final   Streptococcus species NOT DETECTED NOT DETECTED Final   Streptococcus agalactiae NOT DETECTED NOT DETECTED Final   Streptococcus pneumoniae NOT DETECTED NOT DETECTED Final   Streptococcus pyogenes NOT DETECTED NOT DETECTED Final   A.calcoaceticus-baumannii NOT DETECTED NOT DETECTED Final   Bacteroides fragilis NOT DETECTED NOT  DETECTED Final   Enterobacterales NOT DETECTED NOT DETECTED Final   Enterobacter cloacae complex NOT DETECTED NOT DETECTED Final   Escherichia coli NOT DETECTED NOT DETECTED Final   Klebsiella aerogenes NOT DETECTED NOT DETECTED Final   Klebsiella oxytoca NOT DETECTED NOT DETECTED Final   Klebsiella pneumoniae NOT DETECTED NOT DETECTED Final   Proteus species NOT DETECTED NOT DETECTED Final   Salmonella species NOT DETECTED NOT DETECTED Final   Serratia marcescens NOT DETECTED NOT DETECTED Final   Haemophilus influenzae NOT DETECTED NOT DETECTED Final   Neisseria meningitidis NOT DETECTED NOT DETECTED Final   Pseudomonas aeruginosa NOT DETECTED NOT DETECTED Final   Stenotrophomonas maltophilia NOT DETECTED NOT DETECTED Final   Candida albicans NOT DETECTED NOT DETECTED Final   Candida auris NOT DETECTED NOT DETECTED Final   Candida glabrata NOT DETECTED NOT DETECTED Final   Candida krusei NOT DETECTED NOT DETECTED Final   Candida parapsilosis NOT DETECTED NOT DETECTED Final   Candida tropicalis NOT DETECTED NOT DETECTED Final   Cryptococcus neoformans/gattii NOT DETECTED NOT DETECTED Final   Vancomycin resistance NOT DETECTED NOT DETECTED Final    Comment: Performed at [facility], [address] [place], [place] [address]  Urine Culture     Status: None   Collection Time: 08/15/21  6:57 PM   Specimen: In/Out Cath Urine  Result Value Ref Range Status   Specimen Description IN/OUT CATH URINE  Final   Special Requests NONE  Final   Culture   Final    NO GROWTH Performed at [facility], [place] [address] [place], [place] [address]    Report Status 08/17/2021 FINAL  Final  MRSA Next Gen by PCR, Nasal     Status: None   Collection Time: 08/15/21 11:30 PM   Specimen: Nasal Mucosa; Nasal Swab  Result Value Ref Range Status   MRSA by PCR Next Gen NOT DETECTED NOT DETECTED Final    Comment: (NOTE) The GeneXpert MRSA Assay (FDA approved for NASAL specimens only), is one  component of a comprehensive MRSA colonization surveillance program. It is not intended to diagnose MRSA infection nor to guide or monitor treatment for MRSA infections. Test performance is not FDA approved in patients less than 36 years old. Performed at [facility], [place] [address], [place],  [address]   Culture, blood (Routine X 2) w Reflex to ID Panel     Status: None   Collection Time: 08/16/21  2:57 PM   Specimen: BLOOD  Result Value Ref Range Status   Specimen Description BLOOD SITE NOT SPECIFIED  Final   Special Requests   Final    AEROBIC BOTTLE ONLY Blood Culture results may not be optimal due to an inadequate volume of blood received in culture bottles   Culture   Final    NO GROWTH 5 DAYS Performed  at [facility], [place] [address], [place], [place] [address]    Report Status 08/21/2021 FINAL  Final  Culture, blood (Routine X 2) w Reflex to ID Panel     Status: None   Collection Time: 08/16/21  2:58 PM   Specimen: BLOOD  Result Value Ref Range Status   Specimen Description BLOOD SITE NOT SPECIFIED  Final   Special Requests   Final    AEROBIC BOTTLE ONLY Blood Culture results may not be optimal due to an inadequate volume of blood received in culture bottles   Culture   Final    NO GROWTH 5 DAYS Performed at [facility], [place] [address], [place], [place] [address]    Report Status 08/21/2021 FINAL  Final     Labs: BNP (last 3 results) Recent Labs    01/29/21 1520 04/09/21 1123 08/16/21 0149  BNP 170.9* 147.0* 093.2*   Basic Metabolic Panel: Recent Labs  Lab 08/16/21 0149 08/16/21 0440 08/17/21 1031 08/18/21 0029 08/19/21 0246 08/20/21 0220 08/21/21 0119 08/22/21 0042  NA 133*   < > 134* 136 135 133* 135 138  K 3.7   < > 3.4* 3.9 3.9 4.0 3.7 4.1  CL 98  --  100 107 101 99 97* 98  CO2 24  --  25 25 27 25 29 30   GLUCOSE 128*  --  132* 105* 99 92 90 90  BUN 27*  --  24* 16 12 13 14 12   CREATININE 1.61*  --  1.19 1.05 1.19 1.23  1.20 1.41*  CALCIUM 8.4*  --  8.2* 7.7* 7.9* 8.1* 8.2* 8.6*  MG 2.0  --  2.1  --  2.0  --   --   --   PHOS 3.9  --   --   --   --   --   --   --    < > = values in this interval not displayed.   Liver Function Tests: Recent Labs  Lab 08/15/21 1650  AST 68*  ALT 36  ALKPHOS 119  BILITOT 1.4*  PROT 7.2  ALBUMIN 3.2*   No results for input(s): LIPASE, AMYLASE in the last 168 hours. Recent Labs  Lab 08/16/21 0149  AMMONIA 31   CBC: Recent Labs  Lab 08/15/21 1650 08/15/21 1743 08/17/21 0246 08/18/21 0029 08/19/21 0246 08/20/21 0220 08/21/21 0119  WBC 11.3*   < > 9.1 8.2 8.5 9.4 10.2  NEUTROABS 10.1*  --   --   --   --   --   --   HGB 12.1*   < > 11.0* 10.9* 10.8* 10.9* 10.7*  HCT 37.1*   < > 34.0* 33.8* 34.6* 35.3* 35.0*  MCV 82.1   < > 82.1 83.5 84.4 85.1 85.2  PLT 189   < > 180 180 219 265 291   < > = values in this interval not displayed.   Cardiac Enzymes: No results for input(s): CKTOTAL, CKMB, CKMBINDEX, TROPONINI in the last 168 hours. BNP: Invalid input(s): POCBNP CBG: Recent Labs  Lab 08/21/21 2024 08/21/21 2340 08/22/21 0350 08/22/21 0716 08/22/21 1205  GLUCAP 107* 97 88 83 86   D-Dimer No results for input(s): DDIMER in the last 72 hours. Hgb A1c No results for input(s): HGBA1C in the last 72 hours. Lipid Profile No results for input(s): CHOL, HDL, LDLCALC, TRIG, CHOLHDL, LDLDIRECT in the last 72 hours. Thyroid function studies No results for input(s): TSH, T4TOTAL, T3FREE, THYROIDAB in the last 72  hours.  Invalid input(s): FREET3 Anemia work up No results for input(s): VITAMINB12, FOLATE, FERRITIN, TIBC, IRON, RETICCTPCT in the last 72 hours. Urinalysis    Component Value Date/Time   COLORURINE YELLOW 08/15/2021 1811   APPEARANCEUR CLEAR 08/15/2021 1811   LABSPEC 1.025 08/15/2021 1811   PHURINE 5.5 08/15/2021 1811   GLUCOSEU >=500 (A) 08/15/2021 1811   HGBUR SMALL (A) 08/15/2021 1811   HGBUR small 04/16/2010 0807   BILIRUBINUR  NEGATIVE 08/15/2021 1811   BILIRUBINUR Neg 04/21/2012 1414   KETONESUR NEGATIVE 08/15/2021 1811   PROTEINUR 30 (A) 08/15/2021 1811   UROBILINOGEN 0.2 04/21/2012 1414   UROBILINOGEN negative 04/16/2010 0807   NITRITE NEGATIVE 08/15/2021 1811   LEUKOCYTESUR NEGATIVE 08/15/2021 1811   Sepsis Labs Invalid input(s): PROCALCITONIN,  WBC,  LACTICIDVEN Microbiology Recent Results (from the past 240 hour(s))  Culture, blood (Routine x 2)     Status: Abnormal   Collection Time: 08/15/21  4:50 PM   Specimen: BLOOD  Result Value Ref Range Status   Specimen Description BLOOD SITE NOT SPECIFIED  Final   Special Requests   Final    BOTTLES DRAWN AEROBIC AND ANAEROBIC Blood Culture results may not be optimal due to an excessive volume of blood received in culture bottles   Culture  Setup Time   Final    GRAM POSITIVE COCCI IN BOTH AEROBIC AND ANAEROBIC BOTTLES CRITICAL VALUE NOTED.  VALUE IS CONSISTENT WITH PREVIOUSLY REPORTED AND CALLED VALUE.    Culture (A)  Final    ENTEROCOCCUS FAECALIS SUSCEPTIBILITIES PERFORMED ON PREVIOUS CULTURE WITHIN THE LAST 5 DAYS. Performed at [facility], [place] [address], [place], [place] [address]    Report Status 08/18/2021 FINAL  Final  Resp Panel by RT-PCR (Flu A&B, Covid) Peripheral     Status: None   Collection Time: 08/15/21  4:51 PM   Specimen: Peripheral; Nasopharyngeal(NP) swabs in vial transport medium  Result Value Ref Range Status   SARS Coronavirus 2 by RT PCR NEGATIVE NEGATIVE Final    Comment: (NOTE) SARS-CoV-2 target nucleic acids are NOT DETECTED.  The SARS-CoV-2 RNA is generally detectable in upper respiratory specimens during the acute phase of infection. The lowest concentration of SARS-CoV-2 viral copies this assay can detect is 138 copies/mL. A negative result does not preclude SARS-Cov-2 infection and should not be used as the sole basis for treatment or other patient management decisions. A negative result may occur with   improper specimen collection/handling, submission of specimen other than nasopharyngeal swab, presence of viral mutation(s) within the areas targeted by this assay, and inadequate number of viral copies(<138 copies/mL). A negative result must be combined with clinical observations, patient history, and epidemiological information. The expected result is Negative.  Fact Sheet for Patients:  EntrepreneurPulse.com.au  Fact Sheet for Healthcare Providers:  IncredibleEmployment.be  This test is no t yet approved or cleared by the [place] FDA and  has been authorized for detection and/or diagnosis of SARS-CoV-2 by FDA under an Emergency Use Authorization (EUA). This EUA will remain  in effect (meaning this test can be used) for the duration of the COVID-19 declaration under Section 564(b)(1) of the Act, 21 U.S.C.section 360bbb-3(b)(1), unless the authorization is terminated  or revoked sooner.       Influenza A by PCR NEGATIVE NEGATIVE Final   Influenza B by PCR NEGATIVE NEGATIVE Final    Comment: (NOTE) The Xpert Xpress SARS-CoV-2/FLU/RSV plus assay is intended as an aid in the diagnosis of influenza from Nasopharyngeal swab specimens  and should not be used as a sole basis for treatment. Nasal washings and aspirates are unacceptable for Xpert Xpress SARS-CoV-2/FLU/RSV testing.  Fact Sheet for Patients: EntrepreneurPulse.com.au  Fact Sheet for Healthcare Providers: IncredibleEmployment.be  This test is not yet approved or cleared by the [place] FDA and has been authorized for detection and/or diagnosis of SARS-CoV-2 by FDA under an Emergency Use Authorization (EUA). This EUA will remain in effect (meaning this test can be used) for the duration of the COVID-19 declaration under Section 564(b)(1) of the Act, 21 U.S.C. section 360bbb-3(b)(1), unless the authorization is terminated  or revoked.  Performed at [facility], [place] [address], [place], [place] [address]   Culture, blood (Routine x 2)     Status: Abnormal   Collection Time: 08/15/21  5:02 PM   Specimen: BLOOD  Result Value Ref Range Status   Specimen Description BLOOD SITE NOT SPECIFIED  Final   Special Requests   Final    BOTTLES DRAWN AEROBIC AND ANAEROBIC Blood Culture results may not be optimal due to an excessive volume of blood received in culture bottles   Culture  Setup Time   Final    GRAM POSITIVE COCCI IN CHAINS IN BOTH AEROBIC AND ANAEROBIC BOTTLES CRITICAL RESULT CALLED TO, READ BACK BY AND VERIFIED WITHJiles Garter [facility] PHARMD @0817  08/16/21 EB Performed at [facility], [place] [address], [place], [place] [address]    Culture ENTEROCOCCUS FAECALIS (A)  Final   Report Status 08/18/2021 FINAL  Final   Organism ID, Bacteria ENTEROCOCCUS FAECALIS  Final      Susceptibility   Enterococcus faecalis - MIC*    AMPICILLIN <=2 SENSITIVE Sensitive     VANCOMYCIN 1 SENSITIVE Sensitive     GENTAMICIN SYNERGY SENSITIVE Sensitive     * ENTEROCOCCUS FAECALIS  Blood Culture ID Panel (Reflexed)     Status: Abnormal   Collection Time: 08/15/21  5:02 PM  Result Value Ref Range Status   Enterococcus faecalis DETECTED (A) NOT DETECTED Final    Comment: CRITICAL RESULT CALLED TO, READ BACK BY AND VERIFIED WITH: Jiles Garter [facility] PHARMD @0817  08/16/21 EB    Enterococcus Faecium NOT DETECTED NOT DETECTED Final   Listeria monocytogenes NOT DETECTED NOT DETECTED Final   Staphylococcus species NOT DETECTED NOT DETECTED Final   Staphylococcus aureus (BCID) NOT DETECTED NOT DETECTED Final   Staphylococcus epidermidis NOT DETECTED NOT DETECTED Final   Staphylococcus lugdunensis NOT DETECTED NOT DETECTED Final   Streptococcus species NOT DETECTED NOT DETECTED Final   Streptococcus agalactiae NOT DETECTED NOT DETECTED Final   Streptococcus pneumoniae NOT DETECTED NOT DETECTED Final   Streptococcus pyogenes  NOT DETECTED NOT DETECTED Final   A.calcoaceticus-baumannii NOT DETECTED NOT DETECTED Final   Bacteroides fragilis NOT DETECTED NOT DETECTED Final   Enterobacterales NOT DETECTED NOT DETECTED Final   Enterobacter cloacae complex NOT DETECTED NOT DETECTED Final   Escherichia coli NOT DETECTED NOT DETECTED Final   Klebsiella aerogenes NOT DETECTED NOT DETECTED Final   Klebsiella oxytoca NOT DETECTED NOT DETECTED Final   Klebsiella pneumoniae NOT DETECTED NOT DETECTED Final   Proteus species NOT DETECTED NOT DETECTED Final   Salmonella species NOT DETECTED NOT DETECTED Final   Serratia marcescens NOT DETECTED NOT DETECTED Final   Haemophilus influenzae NOT DETECTED NOT DETECTED Final   Neisseria meningitidis NOT DETECTED NOT DETECTED Final   Pseudomonas aeruginosa NOT DETECTED NOT DETECTED Final   Stenotrophomonas maltophilia NOT DETECTED NOT DETECTED Final   Candida albicans  NOT DETECTED NOT DETECTED Final   Candida auris NOT DETECTED NOT DETECTED Final   Candida glabrata NOT DETECTED NOT DETECTED Final   Candida krusei NOT DETECTED NOT DETECTED Final   Candida parapsilosis NOT DETECTED NOT DETECTED Final   Candida tropicalis NOT DETECTED NOT DETECTED Final   Cryptococcus neoformans/gattii NOT DETECTED NOT DETECTED Final   Vancomycin resistance NOT DETECTED NOT DETECTED Final    Comment: Performed at [place] [address] [place], [place] [address]  Urine Culture     Status: None   Collection Time: 08/15/21  6:57 PM   Specimen: In/Out Cath Urine  Result Value Ref Range Status   Specimen Description IN/OUT CATH URINE  Final   Special Requests NONE  Final   Culture   Final    NO GROWTH Performed at [facility], [place] [address] [place], [place] [address]    Report Status 08/17/2021 FINAL  Final  MRSA Next Gen by PCR, Nasal     Status: None   Collection Time: 08/15/21 11:30 PM   Specimen: Nasal Mucosa; Nasal Swab  Result Value Ref Range Status   MRSA by PCR Next  Gen NOT DETECTED NOT DETECTED Final    Comment: (NOTE) The GeneXpert MRSA Assay (FDA approved for NASAL specimens only), is one component of a comprehensive MRSA colonization surveillance program. It is not intended to diagnose MRSA infection nor to guide or monitor treatment for MRSA infections. Test performance is not FDA approved in patients less than 40 years old. Performed at [facility], [place] [address] [place], [place] [address]   Culture, blood (Routine X 2) w Reflex to ID Panel     Status: None   Collection Time: 08/16/21  2:57 PM   Specimen: BLOOD  Result Value Ref Range Status   Specimen Description BLOOD SITE NOT SPECIFIED  Final   Special Requests   Final    AEROBIC BOTTLE ONLY Blood Culture results may not be optimal due to an inadequate volume of blood received in culture bottles   Culture   Final    NO GROWTH 5 DAYS Performed at [facility], [place] [address] [place], [place] [address]    Report Status 08/21/2021 FINAL  Final  Culture, blood (Routine X 2) w Reflex to ID Panel     Status: None   Collection Time: 08/16/21  2:58 PM   Specimen: BLOOD  Result Value Ref Range Status   Specimen Description BLOOD SITE NOT SPECIFIED  Final   Special Requests   Final    AEROBIC BOTTLE ONLY Blood Culture results may not be optimal due to an inadequate volume of blood received in culture bottles   Culture   Final    NO GROWTH 5 DAYS Performed at [facility], [place] [address], [place], [place] [address]    Report Status 08/21/2021 FINAL  Final    Please note: You were cared for by a hospitalist during your hospital stay. Once you are discharged, your primary care physician will handle any further medical issues. Please note that NO REFILLS for any discharge medications will be authorized once you are discharged, as it is imperative that you return to your primary care physician (or establish a relationship with a primary care physician if you do not have one)  for your post hospital discharge needs so that they can reassess your need for medications and monitor your lab values.    Time coordinating discharge:  40 minutes  SIGNED:   Shelly Coss, MD  [facility] 08/22/2021, 1:14 PM Pager [phone]  If 7PM-7AM, please contact night-coverage [email or web address] Password [email or web address]

## 2021-08-22 NOTE — Progress Notes (Addendum)
[facility] Infectious Diseases Follow Up Note  Patient Identification: Patient Name: Sean Riley MRN: [id number] [place] Date: 08/15/2021  4:22 PM Age: 71 y.o.Today's Date: 08/22/2021   Reason for Visit: Enterococcal bacteremia   Principal Problem:   Enterococcal bacteremia Active Problems:   Atrial fibrillation (HCC)   COPD (chronic obstructive pulmonary disease) (HCC)   Normocytic anemia   Acute on chronic respiratory failure with hypoxia (HCC)   Chronic diastolic CHF (congestive heart failure) (HCC)   Severe sepsis with septic shock (HCC)   AKI (acute kidney injury) ([place])   Pressure injury of skin   Acute metabolic encephalopathy   Prosthetic valve endocarditis (HCC)   Acute on chronic diastolic CHF (congestive heart failure) (HCC)   Antibiotics: Ampicillin 1/7-current                    Cefepime 1/6                    Ceftriaxone 1/8-current  Lines/Hardwares: PIV, external urethral catheter  Interval Events: afebrile, no leukocytosis    Assessment High-grade E faecalis bacteremia -History of ascending aortic dissection status post repair and AVR on 03/24/2017 -Repeat blood culture 1/7 no growth in 3 days  -TEE 1/12 not suggestive of endocarditis -No obvious abdominal of GU source, ? Left leg cellulitis ( redness in the left leg during admit).  -Wife says patient was unwell for only 2-3 days prior to admit which is not typical for endocarditis -No peripheral stigmata for endocarditis on exam  -Does not meet [facility]'s criteria for endocarditis ( + Positive blood cultures, + fevers, +Prosthetic heart valve)  2.  Acute respiratory failure in the setting of COPD and CHF/Volume overload  - on diuretics; Cardiology following - on 8L   3. AKI   Recommendations Eugenie Birks switch IV ampicillin and IV ceftriaxone to Linezolid. Low concerns for Serotonin syndrome with Citalopram esp with short duration. Discussed with ID  pharmacy Duration 2 weeks from date of negative blood cultures from 08/16/21 Monitor CBC Fu in 3 weeks in ID clinic for surveillance blood cultures given prosthetic AV ID will sign off  Rest of the management as per the primary team. Thank you for the consult. Please page with pertinent questions or concerns.  ______________________________________________________________________ Subjective patient seen and examined at the bedside.   Sitting up in the chair Back to 8L but feels better Wife at bedside  Vitals BP (!) 150/71 (BP Location: Left Arm)    Pulse 69    Temp 97.8 F (36.6 C) (Oral)    Resp 20    Ht 6\' 1"  (1.854 m)    Wt 89.5 kg    SpO2 95%    BMI 26.03 kg/m     Physical Exam Constitutional: Sitting up in the chair     Comments:   Cardiovascular:     Rate and Rhythm: Normal rate and regular rhythm.     Heart sounds: Irregular rhythm and systolic murmur  Pulmonary:     Effort: On 8 L of nasal cannula    Comments:   Abdominal:     Palpations: Abdomen is soft.     Tenderness: Nontender and nondistended  Musculoskeletal:        General: No swelling or tenderness.   Skin:    Comments: No peripheral stigmata of endocarditis   Neurological:     General: No focal deficit present.  Awake alert and oriented.  Follows commands  Psychiatric:  Mood and Affect: Mood normal.  Calm and cooperative    Pertinent Microbiology Results for orders placed or performed during the hospital encounter of 08/15/21  Culture, blood (Routine x 2)     Status: Abnormal   Collection Time: 08/15/21  4:50 PM   Specimen: BLOOD  Result Value Ref Range Status   Specimen Description BLOOD SITE NOT SPECIFIED  Final   Special Requests   Final    BOTTLES DRAWN AEROBIC AND ANAEROBIC Blood Culture results may not be optimal due to an excessive volume of blood received in culture bottles   Culture  Setup Time   Final    GRAM POSITIVE COCCI IN BOTH AEROBIC AND ANAEROBIC BOTTLES CRITICAL  VALUE NOTED.  VALUE IS CONSISTENT WITH PREVIOUSLY REPORTED AND CALLED VALUE.    Culture (A)  Final    ENTEROCOCCUS FAECALIS SUSCEPTIBILITIES PERFORMED ON PREVIOUS CULTURE WITHIN THE LAST 5 DAYS. Performed at [facility], [place] [address] [place], [place] [address]    Report Status 08/18/2021 FINAL  Final  Resp Panel by RT-PCR (Flu A&B, Covid) Peripheral     Status: None   Collection Time: 08/15/21  4:51 PM   Specimen: Peripheral; Nasopharyngeal(NP) swabs in vial transport medium  Result Value Ref Range Status   SARS Coronavirus 2 by RT PCR NEGATIVE NEGATIVE Final    Comment: (NOTE) SARS-CoV-2 target nucleic acids are NOT DETECTED.  The SARS-CoV-2 RNA is generally detectable in upper respiratory specimens during the acute phase of infection. The lowest concentration of SARS-CoV-2 viral copies this assay can detect is 138 copies/mL. A negative result does not preclude SARS-Cov-2 infection and should not be used as the sole basis for treatment or other patient management decisions. A negative result may occur with  improper specimen collection/handling, submission of specimen other than nasopharyngeal swab, presence of viral mutation(s) within the areas targeted by this assay, and inadequate number of viral copies(<138 copies/mL). A negative result must be combined with clinical observations, patient history, and epidemiological information. The expected result is Negative.  Fact Sheet for Patients:  EntrepreneurPulse.com.au  Fact Sheet for Healthcare Providers:  IncredibleEmployment.be  This test is no t yet approved or cleared by the [place] FDA and  has been authorized for detection and/or diagnosis of SARS-CoV-2 by FDA under an Emergency Use Authorization (EUA). This EUA will remain  in effect (meaning this test can be used) for the duration of the COVID-19 declaration under Section 564(b)(1) of the Act, 21 U.S.C.section  360bbb-3(b)(1), unless the authorization is terminated  or revoked sooner.       Influenza A by PCR NEGATIVE NEGATIVE Final   Influenza B by PCR NEGATIVE NEGATIVE Final    Comment: (NOTE) The Xpert Xpress SARS-CoV-2/FLU/RSV plus assay is intended as an aid in the diagnosis of influenza from Nasopharyngeal swab specimens and should not be used as a sole basis for treatment. Nasal washings and aspirates are unacceptable for Xpert Xpress SARS-CoV-2/FLU/RSV testing.  Fact Sheet for Patients: EntrepreneurPulse.com.au  Fact Sheet for Healthcare Providers: IncredibleEmployment.be  This test is not yet approved or cleared by the [place] FDA and has been authorized for detection and/or diagnosis of SARS-CoV-2 by FDA under an Emergency Use Authorization (EUA). This EUA will remain in effect (meaning this test can be used) for the duration of the COVID-19 declaration under Section 564(b)(1) of the Act, 21 U.S.C. section 360bbb-3(b)(1), unless the authorization is terminated or revoked.  Performed at [facility], [place] [address], [place], [place]  [address]   Culture, blood (Routine x 2)     Status: Abnormal   Collection Time: 08/15/21  5:02 PM   Specimen: BLOOD  Result Value Ref Range Status   Specimen Description BLOOD SITE NOT SPECIFIED  Final   Special Requests   Final    BOTTLES DRAWN AEROBIC AND ANAEROBIC Blood Culture results may not be optimal due to an excessive volume of blood received in culture bottles   Culture  Setup Time   Final    GRAM POSITIVE COCCI IN CHAINS IN BOTH AEROBIC AND ANAEROBIC BOTTLES CRITICAL RESULT CALLED TO, READ BACK BY AND VERIFIED WITHJiles Garter [facility] PHARMD @0817  08/16/21 EB Performed at [facility], [place] [address], [place], [place] [address]    Culture ENTEROCOCCUS FAECALIS (A)  Final   Report Status 08/18/2021 FINAL  Final   Organism ID, Bacteria ENTEROCOCCUS FAECALIS  Final      Susceptibility    Enterococcus faecalis - MIC*    AMPICILLIN <=2 SENSITIVE Sensitive     VANCOMYCIN 1 SENSITIVE Sensitive     GENTAMICIN SYNERGY SENSITIVE Sensitive     * ENTEROCOCCUS FAECALIS  Blood Culture ID Panel (Reflexed)     Status: Abnormal   Collection Time: 08/15/21  5:02 PM  Result Value Ref Range Status   Enterococcus faecalis DETECTED (A) NOT DETECTED Final    Comment: CRITICAL RESULT CALLED TO, READ BACK BY AND VERIFIED WITH: H, VON DOHLEN PHARMD @0817  08/16/21 EB    Enterococcus Faecium NOT DETECTED NOT DETECTED Final   Listeria monocytogenes NOT DETECTED NOT DETECTED Final   Staphylococcus species NOT DETECTED NOT DETECTED Final   Staphylococcus aureus (BCID) NOT DETECTED NOT DETECTED Final   Staphylococcus epidermidis NOT DETECTED NOT DETECTED Final   Staphylococcus lugdunensis NOT DETECTED NOT DETECTED Final   Streptococcus species NOT DETECTED NOT DETECTED Final   Streptococcus agalactiae NOT DETECTED NOT DETECTED Final   Streptococcus pneumoniae NOT DETECTED NOT DETECTED Final   Streptococcus pyogenes NOT DETECTED NOT DETECTED Final   A.calcoaceticus-baumannii NOT DETECTED NOT DETECTED Final   Bacteroides fragilis NOT DETECTED NOT DETECTED Final   Enterobacterales NOT DETECTED NOT DETECTED Final   Enterobacter cloacae complex NOT DETECTED NOT DETECTED Final   Escherichia coli NOT DETECTED NOT DETECTED Final   Klebsiella aerogenes NOT DETECTED NOT DETECTED Final   Klebsiella oxytoca NOT DETECTED NOT DETECTED Final   Klebsiella pneumoniae NOT DETECTED NOT DETECTED Final   Proteus species NOT DETECTED NOT DETECTED Final   Salmonella species NOT DETECTED NOT DETECTED Final   Serratia marcescens NOT DETECTED NOT DETECTED Final   Haemophilus influenzae NOT DETECTED NOT DETECTED Final   Neisseria meningitidis NOT DETECTED NOT DETECTED Final   Pseudomonas aeruginosa NOT DETECTED NOT DETECTED Final   Stenotrophomonas maltophilia NOT DETECTED NOT DETECTED Final   Candida albicans NOT  DETECTED NOT DETECTED Final   Candida auris NOT DETECTED NOT DETECTED Final   Candida glabrata NOT DETECTED NOT DETECTED Final   Candida krusei NOT DETECTED NOT DETECTED Final   Candida parapsilosis NOT DETECTED NOT DETECTED Final   Candida tropicalis NOT DETECTED NOT DETECTED Final   Cryptococcus neoformans/gattii NOT DETECTED NOT DETECTED Final   Vancomycin resistance NOT DETECTED NOT DETECTED Final    Comment: Performed at [facility], [place] [address] [place], [place] [address]  Urine Culture     Status: None   Collection Time: 08/15/21  6:57 PM   Specimen: In/Out Cath Urine  Result Value Ref Range Status  Specimen Description IN/OUT CATH URINE  Final   Special Requests NONE  Final   Culture   Final    NO GROWTH Performed at [facility], [place] [address] [place], [place] [address]    Report Status 08/17/2021 FINAL  Final  MRSA Next Gen by PCR, Nasal     Status: None   Collection Time: 08/15/21 11:30 PM   Specimen: Nasal Mucosa; Nasal Swab  Result Value Ref Range Status   MRSA by PCR Next Gen NOT DETECTED NOT DETECTED Final    Comment: (NOTE) The GeneXpert MRSA Assay (FDA approved for NASAL specimens only), is one component of a comprehensive MRSA colonization surveillance program. It is not intended to diagnose MRSA infection nor to guide or monitor treatment for MRSA infections. Test performance is not FDA approved in patients less than 41 years old. Performed at [facility], [place] [address] [place], [place] [address]   Culture, blood (Routine X 2) w Reflex to ID Panel     Status: None   Collection Time: 08/16/21  2:57 PM   Specimen: BLOOD  Result Value Ref Range Status   Specimen Description BLOOD SITE NOT SPECIFIED  Final   Special Requests   Final    AEROBIC BOTTLE ONLY Blood Culture results may not be optimal due to an inadequate volume of blood received in culture bottles   Culture   Final    NO GROWTH 5 DAYS Performed at [place], [place] [address] [place], [place] [address]    Report Status 08/21/2021 FINAL  Final  Culture, blood (Routine X 2) w Reflex to ID Panel     Status: None   Collection Time: 08/16/21  2:58 PM   Specimen: BLOOD  Result Value Ref Range Status   Specimen Description BLOOD SITE NOT SPECIFIED  Final   Special Requests   Final    AEROBIC BOTTLE ONLY Blood Culture results may not be optimal due to an inadequate volume of blood received in culture bottles   Culture   Final    NO GROWTH 5 DAYS Performed at [facility], [place] [address], [place], [place] [address]    Report Status 08/21/2021 FINAL  Final    Pertinent Lab. CBC Latest Ref Rng & Units 08/21/2021 08/20/2021 08/19/2021  WBC 4.0 - 10.5 K/uL 10.2 9.4 8.5  Hemoglobin 13.0 - 17.0 g/dL 10.7(L) 10.9(L) 10.8(L)  Hematocrit 39.0 - 52.0 % 35.0(L) 35.3(L) 34.6(L)  Platelets 150 - 400 K/uL 291 265 219   CMP Latest Ref Rng & Units 08/22/2021 08/21/2021 08/20/2021  Glucose 70 - 99 mg/dL 90 90 92  BUN 8 - 23 mg/dL 12 14 13   Creatinine 0.61 - 1.24 mg/dL 1.41(H) 1.20 1.23  Sodium 135 - 145 mmol/L 138 135 133(L)  Potassium 3.5 - 5.1 mmol/L 4.1 3.7 4.0  Chloride 98 - 111 mmol/L 98 97(L) 99  CO2 22 - 32 mmol/L 30 29 25   Calcium 8.9 - 10.3 mg/dL 8.6(L) 8.2(L) 8.1(L)  Total Protein 6.5 - 8.1 g/dL - - -  Total Bilirubin 0.3 - 1.2 mg/dL - - -  Alkaline Phos 38 - 126 U/L - - -  AST 15 - 41 U/L - - -  ALT 0 - 44 U/L - - -     Pertinent Imaging today Plain films and CT images have been personally visualized and interpreted; radiology reports have been reviewed. Decision making incorporated into the Impression / Recommendations.  TEE 08/21/20 IMPRESSIONS  1. Left ventricular ejection fraction, by estimation, is 55 to 60%. The  left ventricle has normal function. The left ventricle has no regional  wall motion abnormalities.   2. Right ventricular systolic function is mildly reduced. The right  ventricular size is mildly enlarged.   3. Left  atrial size was mildly dilated. No left atrial/left atrial  appendage thrombus was detected.   4. Right atrial size was mildly dilated.   5. The mitral valve is abnormal. Moderate posteriorly-directed mitral  valve regurgitation. No flow reversal in the pulmonary vein systolic  doppler pattern. No evidence of mitral stenosis. No mitral valve  vegetation.   6. No tricuspid valve vegetation. Peak RV-RA gradient 17 mmHg. The  tricuspid valve is abnormal. Tricuspid valve regurgitation is moderate.   7. 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 The valve  opens normally. Trivial aortic insufficiency.   8. No PFO or ASD by color doppler.   9. No pulmonary valve vegetation.  10. Stable repaired ascending aorta. Aortic root/ascending aorta has been  repaired/replaced.   I spent approx 36 minutes for this patient encounter including review of prior medical records, coordination of care with primary/other specialist with greater than 50% of time being face to face/counseling and discussing diagnostics/treatment plan with the patient/family.  Electronically signed by:   Rosiland Oz, MD Infectious Disease Physician [facility] for Infectious Disease Pager: [phone]

## 2021-08-22 NOTE — Progress Notes (Addendum)
Advanced Heart Failure Rounding Note  PCP-Cardiologist: Dr. Aundra Dubin   Subjective:   1/12 S/P TEE - no endocarditis. Stable bioprosthetic valve. Home torsemide restarted.   Respiratory status improving, down from 8L [place] to 4-5L [place] (baseline 4 L).   Remains on ceftriaxone and ampicillin per ID.   BC from 01/07 with NGTD  Complains of fatigue moving from bed to chair.   Objective:   Weight Range: 89.5 kg Body mass index is 26.03 kg/m.   Vital Signs:   Temp:  [97.5 F (36.4 C)-98.8 F (37.1 C)] 97.8 F (36.6 C) (01/13 0715) Pulse Rate:  [64-89] 86 (01/13 0841) Resp:  [16-25] 24 (01/13 0841) BP: (124-152)/(56-80) 150/71 (01/13 0715) SpO2:  [82 %-100 %] 97 % (01/13 0841) Weight:  [89.3 kg-89.5 kg] 89.5 kg (01/13 0351) Last BM Date: 08/18/21  Weight change: Filed Weights   08/21/21 0317 08/21/21 1330 08/22/21 0351  Weight: 89.3 kg 89.3 kg 89.5 kg    Intake/Output:   Intake/Output Summary (Last 24 hours) at 08/22/2021 0947 Last data filed at 08/22/2021 0354 Gross per 24 hour  Intake 2542.87 ml  Output 2901 ml  Net -358.13 ml      Physical Exam   General: Sitting in the chair. No resp difficulty HEENT: normal Neck: supple. no JVD. Carotids 2+ bilat; no bruits. No lymphadenopathy or thryomegaly appreciated. Cor: PMI nondisplaced. Regular rate & rhythm. No rubs, gallops or murmurs. Lungs: clear on 8 liters.  Abdomen: soft, nontender, nondistended. No hepatosplenomegaly. No bruits or masses. Good bowel sounds. Extremities: no cyanosis, clubbing, rash, edema Neuro: alert & orientedx3, cranial nerves grossly intact. moves all 4 extremities w/o difficulty. Affect pleasant   Telemetry   SR 60s   Labs    CBC Recent Labs    08/20/21 0220 08/21/21 0119  WBC 9.4 10.2  HGB 10.9* 10.7*  HCT 35.3* 35.0*  MCV 85.1 85.2  PLT 265 706   Basic Metabolic Panel Recent Labs    08/21/21 0119 08/22/21 0042  NA 135 138  K 3.7 4.1  CL 97* 98  CO2 29 30  GLUCOSE  90 90  BUN 14 12  CREATININE 1.20 1.41*  CALCIUM 8.2* 8.6*   Liver Function Tests No results for input(s): AST, ALT, ALKPHOS, BILITOT, PROT, ALBUMIN in the last 72 hours. No results for input(s): LIPASE, AMYLASE in the last 72 hours. Cardiac Enzymes No results for input(s): CKTOTAL, CKMB, CKMBINDEX, TROPONINI in the last 72 hours.  BNP: BNP (last 3 results) Recent Labs    01/29/21 1520 04/09/21 1123 08/16/21 0149  BNP 170.9* 147.0* 303.9*    ProBNP (last 3 results) No results for input(s): PROBNP in the last 8760 hours.   D-Dimer No results for input(s): DDIMER in the last 72 hours. Hemoglobin A1C No results for input(s): HGBA1C in the last 72 hours. Fasting Lipid Panel No results for input(s): CHOL, HDL, LDLCALC, TRIG, CHOLHDL, LDLDIRECT in the last 72 hours. Thyroid Function Tests No results for input(s): TSH, T4TOTAL, T3FREE, THYROIDAB in the last 72 hours.  Invalid input(s): FREET3  Other results:   Imaging    ECHO TEE  Result Date: 08/21/2021    TRANSESOPHOGEAL ECHO REPORT   Patient Name:   Sean Riley Date of Exam: 08/21/2021 Medical Rec #:  [id number]        Height:       73.0 in Accession #:    [id number]       Weight:       196.9  lb Date of Birth:  1951-04-05        BSA:          2.137 m Patient Age:    34 years         BP:           143/67 mmHg Patient Gender: M                HR:           70 bpm. Exam Location:  Inpatient Procedure: Transesophageal Echo, Color Doppler and Cardiac Doppler Indications:     Bacteremia  History:         Patient has prior history of Echocardiogram examinations, most                  recent 08/16/2021. CHF, COPD, Arrythmias:Atrial Fibrillation;                  Risk Factors:Hypertension and Dyslipidemia. 03/23/17 Bentall                  procedure with 28mm Edwards Magna Ease Aortic Valve                  Bioprosthetic and 35mm Gelweave at [facility] of [place]alsal[place].  Sonographer:     Raquel Sarna Senior RDCS Referring Phys:  [place] Diagnosing Phys: Franki Monte PROCEDURE: After discussion of the risks and benefits of a TEE, an informed consent was obtained from the patient. The transesophogeal probe was passed without difficulty through the esophogus of the patient. Sedation performed by different physician. The patient was monitored while under deep sedation. Anesthestetic sedation was provided intravenously by Anesthesiology: 146mg  of Propofol. The patient developed no complications during the procedure. IMPRESSIONS  1. Left ventricular ejection fraction, by estimation, is 55 to 60%. The left ventricle has normal function. The left ventricle has no regional wall motion abnormalities.  2. Right ventricular systolic function is mildly reduced. The right ventricular size is mildly enlarged.  3. Left atrial size was mildly dilated. No left atrial/left atrial appendage thrombus was detected.  4. Right atrial size was mildly dilated.  5. The mitral valve is abnormal. Moderate posteriorly-directed mitral valve regurgitation. No flow reversal in the pulmonary vein systolic doppler pattern. No evidence of mitral stenosis. No mitral valve vegetation.  6. No tricuspid valve vegetation. Peak RV-RA gradient 17 mmHg. The tricuspid valve is abnormal. Tricuspid valve regurgitation is moderate.  7. 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 The valve opens normally. Trivial aortic insufficiency.  8. No PFO or ASD by color doppler.  9. No pulmonary valve vegetation. 10. Stable repaired ascending aorta. Aortic root/ascending aorta has been repaired/replaced. FINDINGS  Left Ventricle: Left ventricular ejection fraction, by estimation, is 55 to 60%. The left ventricle has normal function. The left ventricle has no regional wall motion abnormalities. The left ventricular internal cavity size was normal in size. There is  no left ventricular hypertrophy. Right Ventricle: The right ventricular size is mildly enlarged. No increase in right  ventricular wall thickness. Right ventricular systolic function is mildly reduced. Left Atrium: Left atrial size was mildly dilated. No left atrial/left atrial appendage thrombus was detected. Right Atrium: Right atrial size was mildly dilated. Pericardium: There is no evidence of pericardial effusion. Mitral Valve: The mitral valve is abnormal. Moderate mitral valve regurgitation. No evidence of mitral valve stenosis. Tricuspid Valve: No tricuspid valve vegetation. Peak RV-RA gradient 17 mmHg. The tricuspid valve is abnormal. Tricuspid  valve regurgitation is moderate. Aortic Valve: There is a bioprosthetic aortic valve with no vegetation. The valve opens normally. Trivial aortic insufficiency. The aortic valve has been repaired/replaced. Aortic valve regurgitation is trivial. Pulmonic Valve: No pulmonary valve vegetation. The pulmonic valve was normal in structure. Pulmonic valve regurgitation is not visualized. 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 IAS/Shunts: No PFO or ASD by color doppler. Sean Riley [employer] Electronically signed by Franki Monte Signature Date/Time: 08/21/2021/3:57:59 PM    Final      Medications:     Scheduled Medications:  ALPRAZolam  1 mg Oral QHS   Chlorhexidine Gluconate Cloth  6 each Topical Daily   escitalopram  10 mg Oral Daily   flecainide  75 mg Oral BID   insulin aspart  0-15 Units Subcutaneous Q4H   linezolid  600 mg Oral Q12H   mouth rinse  15 mL Mouth Rinse BID   metoprolol tartrate  12.5 mg Oral BID   pantoprazole  40 mg Oral QHS   rosuvastatin  5 mg Oral Daily   torsemide  60 mg Oral Daily   triamcinolone cream   Topical BID   umeclidinium-vilanterol  1 puff Inhalation Daily    Infusions:  sodium chloride Stopped (08/16/21 0004)   heparin 1,600 Units/hr (08/22/21 0401)    PRN Medications: albuterol, bisacodyl, docusate sodium, fluticasone, magnesium hydroxide, ondansetron (ZOFRAN) IV,  polyethylene glycol, polyvinyl alcohol    Patient Profile   71 y/o male w/ chronic diastolic heart failure, Type A aortic dissection, 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 Blood Cx + for enterococcus faecalis  Assessment/Plan   1. 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 Source uncertain. ? LE cellulitis. CT of A/P w/ no acute findings.  UA/UCx negative. CXR ? LLL pneumonia. Initial lactate 1.3. PCT 0.78>>0.51. WBC 11>>8>>9K. Fevers resolved. Treating presumptively for endocarditis. Has bioprosthetic AoV. TTE w/ mod TR, mild MR. Prosthetic AoV not well visualized due to  poor acoustic windows.  - ID now following, on ceftriaxone and ampicillin. Eventual PICC.  - TEE- no endocarditis.  2. Acute on Chronic Hypoxic Respiratory Failure: multifactorial. Moderate COPD on home O2 (4L/min baseline) + chronic diastolic heart failure. Increased O2 requirements this admit, currently on 8L HFNC, in setting of sepsis s/p IVF resuscitation and ? LLL PNA on CXR. Suspect mild fluid overload post IVF resuscitation - On 8 liters.   - Transitioned to Torsemide 60 mg daily (home dose). - continue abx per ID  - wean [place] as tolerated  3. Acute on Chronic Diastolic Heart Failure: Echo in 2/20 with EF 55%, mildly decreased RV systolic function, normally functioning bioprosthetic aortic valve. RHC in 11/20 after increasing torsemide showed near-normal filling pressures.  PYP study was not suggestive of ATTR amyloidosis and myeloma panel was negative. Echo in 1/22 showed EF 60-65%, moderate RV enlargement with mildly decreased RV systolic function, PASP 44, bioprosthetic AVR with mean gradient 18 mmHg. Echo this admit, EF 55-60% RV moderately enlarged w/ moderately elevated RVSP 55.4 mmHg, RA severely dilated, mod TR, mild MR. Prosthetic AoV not well visualized, poor  acoustic windows. Peak and mean gradients through the valve are 5 and 2 mm Hg respectively. Previous echo in May 2022, peak and mean gradients were 34 and 18 mm Hg respectively. Diuretics held on admit due to AKI, hypotension and sepsis. Now s/p IVF resuscitation. AKI resolved and had mild fluid  overload on exam. Diuresed w/ IV Lasix. - Volume status stable. Continue torsemide.  - continue metoprolol 12.5 mg bid  4. AKI: admit SCr 1.9 (baseline ~1.4). In setting of sepsis/ hypotension.  - Resolved.  5. 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 Roxy Manns. Echo 1/22 showed stable bioprosthetic aortic valve.  Dr. Trula Slade has been following his residual dissected aorta.  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 6. Atrial fibrillation: Paroxysmal.  S/p DCCV in 11/21.   - Maintaining SR.  - Continue flecainide + metoprolol.  - Eliquis 5 mg bid PTA. On heparin gtt for now . May be able to transition today. 7. COPD: Moderate to severe obstruction on 6/19 PFTs, prior smoker.  He is on home oxygen. Followed by pulmonology   4. OSA: CPAP QHS.    Length of Stay: 7  Amy Clegg, NP  08/22/2021, 9:47 AM  Advanced Heart Failure Team Pager [phone] (M-F; 7a - 5p)  Please contact [facility] for night-coverage after hours (5p -7a ) and weekends on [email or web address]   Patient seen with NP, agree with the above note.   Patient is now down to 4L [place], his home oxygen level.  Saturation 92% at rest.  He denies dyspnea at rest.   TEE yesterday showed LV EF 55-60%, mildly decreased RV systolic function, moderate MR, normal bioprosthetic AoV.  No vegetation.   He has been switched to linezolid.   General: NAD Neck: No JVD, no thyromegaly or thyroid nodule.  Lungs: Clear to auscultation bilaterally with normal respiratory effort. CV: Nondisplaced PMI.  Heart regular S1/S2, no S3/S4, 1/6 SEM RUSB.  No peripheral edema.   N Abdomen: Soft, nontender, no hepatosplenomegaly, no distention.  Skin: Intact without lesions or rashes.  Neurologic: Alert and oriented x 3.  Psych: Normal affect. Extremities: No clubbing or cyanosis.  HEENT: Normal.   Patient is stable from cardiac standpoint.  Volume status ok on home torsemide 60 mg daily.   No evidence for endocarditis on TEE, he has had E faecalis bacteremia, abx switched to linezolid for 2 wk course by ID.    He is in NSR.  Continue metoprolol and flecainide.  He can switch back to Eliquis from heparin gtt.   OK for discharge from my standpoint as long he he can stay on his baseline oxygen.  We will arrange HF clinic followup.  Call with questions over weekend if he does not go home.   [name] 08/22/2021 12:42 PM

## 2021-08-22 NOTE — TOC CM/SW Note (Signed)
HF TOC CM spoke to wife and Zyvox was brought up from pharmacy and she has in hand. States her copay $6. Message sent to attending that pt will need a home concentrator tank that goes above 5 L. Will need new orders and qualifying sats. [place], Heart Failure TOC CM [phone]

## 2021-08-22 NOTE — Progress Notes (Signed)
SATURATION QUALIFICATIONS: (This note is used to comply with regulatory documentation for home oxygen)   Patient Saturations on Room Air at Rest = 81%   Patient Saturations on Room Air while Ambulating = 80%   Patient Saturations on 5 Liters of oxygen while Ambulating = 91%   Please briefly explain why patient needs home oxygen: patient de-sats without oxygen while ambulating

## 2021-08-23 DIAGNOSIS — K219 Gastro-esophageal reflux disease without esophagitis: Secondary | ICD-10-CM | POA: Diagnosis not present

## 2021-08-23 DIAGNOSIS — M1990 Unspecified osteoarthritis, unspecified site: Secondary | ICD-10-CM | POA: Diagnosis not present

## 2021-08-23 DIAGNOSIS — G4730 Sleep apnea, unspecified: Secondary | ICD-10-CM | POA: Diagnosis not present

## 2021-08-23 DIAGNOSIS — J181 Lobar pneumonia, unspecified organism: Secondary | ICD-10-CM | POA: Diagnosis not present

## 2021-08-23 DIAGNOSIS — A4181 Sepsis due to Enterococcus: Secondary | ICD-10-CM | POA: Diagnosis not present

## 2021-08-23 DIAGNOSIS — K5790 Diverticulosis of intestine, part unspecified, without perforation or abscess without bleeding: Secondary | ICD-10-CM | POA: Diagnosis not present

## 2021-08-23 DIAGNOSIS — J440 Chronic obstructive pulmonary disease with acute lower respiratory infection: Secondary | ICD-10-CM | POA: Diagnosis not present

## 2021-08-23 DIAGNOSIS — Z7901 Long term (current) use of anticoagulants: Secondary | ICD-10-CM | POA: Diagnosis not present

## 2021-08-23 DIAGNOSIS — J9621 Acute and chronic respiratory failure with hypoxia: Secondary | ICD-10-CM | POA: Diagnosis not present

## 2021-08-23 DIAGNOSIS — Z9181 History of falling: Secondary | ICD-10-CM | POA: Diagnosis not present

## 2021-08-23 DIAGNOSIS — N400 Enlarged prostate without lower urinary tract symptoms: Secondary | ICD-10-CM | POA: Diagnosis not present

## 2021-08-23 DIAGNOSIS — Z7951 Long term (current) use of inhaled steroids: Secondary | ICD-10-CM | POA: Diagnosis not present

## 2021-08-23 DIAGNOSIS — A419 Sepsis, unspecified organism: Secondary | ICD-10-CM | POA: Diagnosis not present

## 2021-08-23 DIAGNOSIS — I130 Hypertensive heart and chronic kidney disease with heart failure and stage 1 through stage 4 chronic kidney disease, or unspecified chronic kidney disease: Secondary | ICD-10-CM | POA: Diagnosis not present

## 2021-08-23 DIAGNOSIS — N1832 Chronic kidney disease, stage 3b: Secondary | ICD-10-CM | POA: Diagnosis not present

## 2021-08-23 DIAGNOSIS — D631 Anemia in chronic kidney disease: Secondary | ICD-10-CM | POA: Diagnosis not present

## 2021-08-23 DIAGNOSIS — I480 Paroxysmal atrial fibrillation: Secondary | ICD-10-CM | POA: Diagnosis not present

## 2021-08-23 DIAGNOSIS — Z9981 Dependence on supplemental oxygen: Secondary | ICD-10-CM | POA: Diagnosis not present

## 2021-08-23 DIAGNOSIS — Z952 Presence of prosthetic heart valve: Secondary | ICD-10-CM | POA: Diagnosis not present

## 2021-08-23 DIAGNOSIS — J441 Chronic obstructive pulmonary disease with (acute) exacerbation: Secondary | ICD-10-CM | POA: Diagnosis not present

## 2021-08-23 DIAGNOSIS — N179 Acute kidney failure, unspecified: Secondary | ICD-10-CM | POA: Diagnosis not present

## 2021-08-23 DIAGNOSIS — Z87891 Personal history of nicotine dependence: Secondary | ICD-10-CM | POA: Diagnosis not present

## 2021-08-23 DIAGNOSIS — I5033 Acute on chronic diastolic (congestive) heart failure: Secondary | ICD-10-CM | POA: Diagnosis not present

## 2021-08-23 DIAGNOSIS — R6521 Severe sepsis with septic shock: Secondary | ICD-10-CM | POA: Diagnosis not present

## 2021-08-25 ENCOUNTER — Telehealth: Payer: Self-pay

## 2021-08-25 ENCOUNTER — Encounter (HOSPITAL_COMMUNITY): Payer: Medicare Other | Admitting: Cardiology

## 2021-08-25 DIAGNOSIS — A4181 Sepsis due to Enterococcus: Secondary | ICD-10-CM | POA: Diagnosis not present

## 2021-08-25 DIAGNOSIS — J9621 Acute and chronic respiratory failure with hypoxia: Secondary | ICD-10-CM | POA: Diagnosis not present

## 2021-08-25 DIAGNOSIS — J181 Lobar pneumonia, unspecified organism: Secondary | ICD-10-CM | POA: Diagnosis not present

## 2021-08-25 DIAGNOSIS — R6521 Severe sepsis with septic shock: Secondary | ICD-10-CM | POA: Diagnosis not present

## 2021-08-25 DIAGNOSIS — J440 Chronic obstructive pulmonary disease with acute lower respiratory infection: Secondary | ICD-10-CM | POA: Diagnosis not present

## 2021-08-25 DIAGNOSIS — N179 Acute kidney failure, unspecified: Secondary | ICD-10-CM | POA: Diagnosis not present

## 2021-08-25 NOTE — Telephone Encounter (Signed)
Transition Care Management Follow-up Telephone Call Date of discharge and from where: 08/22/2021 Diagnosis: SEPSIS How have you been since you were released from the hospital? Pt states he is improving but is still weak. Any questions or concerns? No  Items Reviewed: Did the pt receive and understand the discharge instructions provided? Yes  Medications obtained and verified? Yes  Other? No  Any new allergies since your discharge? No  Dietary orders reviewed? Yes Do you have support at home? Yes   Home Care and Equipment/Supplies: Were home health services ordered? no If so, what is the name of the agency? N\/A  Has the agency set up a time to come to the patient's home? not applicable Were any new equipment or medical supplies ordered?  Yes: O2 What is the name of the medical supply agency? Sent home with pt from hospital. Were you able to get the supplies/equipment? yes Do you have any questions related to the use of the equipment or supplies? No  Functional Questionnaire: (I = Independent and D = Dependent) ADLs: I  Bathing/Dressing- I  Meal Prep- I  Eating- I  Maintaining continence- I  Transferring/Ambulation- I  Managing Meds- I  Follow up appointments reviewed:  PCP Hospital f/u appt confirmed? Yes  Scheduled to see Dr. Larose Kells on 08/29/2021 @ 3:40. [facility] f/u appt confirmed?  Pt to f/u with [facility] but they have not contacted pt yet.    Are transportation arrangements needed? No  If their condition worsens, is the pt aware to call PCP or go to the Emergency Dept.? Yes Was the patient provided with contact information for the PCP's office or ED? Yes Was to pt encouraged to call back with questions or concerns? Yes

## 2021-08-26 ENCOUNTER — Telehealth: Payer: Self-pay

## 2021-08-26 DIAGNOSIS — J181 Lobar pneumonia, unspecified organism: Secondary | ICD-10-CM | POA: Diagnosis not present

## 2021-08-26 DIAGNOSIS — J9621 Acute and chronic respiratory failure with hypoxia: Secondary | ICD-10-CM | POA: Diagnosis not present

## 2021-08-26 DIAGNOSIS — N179 Acute kidney failure, unspecified: Secondary | ICD-10-CM | POA: Diagnosis not present

## 2021-08-26 DIAGNOSIS — A4181 Sepsis due to Enterococcus: Secondary | ICD-10-CM | POA: Diagnosis not present

## 2021-08-26 DIAGNOSIS — R6521 Severe sepsis with septic shock: Secondary | ICD-10-CM | POA: Diagnosis not present

## 2021-08-26 DIAGNOSIS — J440 Chronic obstructive pulmonary disease with acute lower respiratory infection: Secondary | ICD-10-CM | POA: Diagnosis not present

## 2021-08-26 NOTE — Telephone Encounter (Signed)
Received call from Dian Situ, [place] w/ [facility]- he is at Pt's home for PT home health eval today, wanted to call regarding O2 and BP: O2 is 91% on 5L, BP is 92/44, Pt is fatigued but otherwise asymptomatic. Informed I'd make PCP aware and call back w/ recommendations.   Dian Situ562 [phone]

## 2021-08-26 NOTE — Telephone Encounter (Signed)
Spoke w/ [name]- informed of recommendations. She will call EMS to transfer. I made Dian Situ, PT aware as well.

## 2021-08-26 NOTE — Telephone Encounter (Signed)
Patient is now hypotensive in the context of recent septic shock. He is also hypoxic however on reviewing the chart he has been using oxygen up to 5 L a minute. Plan: Rec to be checked at the ER

## 2021-08-27 DIAGNOSIS — N179 Acute kidney failure, unspecified: Secondary | ICD-10-CM | POA: Diagnosis not present

## 2021-08-27 DIAGNOSIS — J9621 Acute and chronic respiratory failure with hypoxia: Secondary | ICD-10-CM | POA: Diagnosis not present

## 2021-08-27 DIAGNOSIS — R6521 Severe sepsis with septic shock: Secondary | ICD-10-CM | POA: Diagnosis not present

## 2021-08-27 DIAGNOSIS — J181 Lobar pneumonia, unspecified organism: Secondary | ICD-10-CM | POA: Diagnosis not present

## 2021-08-27 DIAGNOSIS — J440 Chronic obstructive pulmonary disease with acute lower respiratory infection: Secondary | ICD-10-CM | POA: Diagnosis not present

## 2021-08-27 DIAGNOSIS — A4181 Sepsis due to Enterococcus: Secondary | ICD-10-CM | POA: Diagnosis not present

## 2021-08-27 NOTE — Telephone Encounter (Signed)
Not sure if he went to the ER as recommended yesterday. Likely to check his status,  called the patient, the patient's wife: No answer, unable to leave a message.

## 2021-08-29 ENCOUNTER — Encounter: Payer: Self-pay | Admitting: Internal Medicine

## 2021-08-29 ENCOUNTER — Ambulatory Visit (INDEPENDENT_AMBULATORY_CARE_PROVIDER_SITE_OTHER): Payer: Medicare Other | Admitting: Internal Medicine

## 2021-08-29 VITALS — BP 120/58 | HR 77 | Temp 97.7°F | Resp 20 | Ht 73.0 in | Wt 177.5 lb

## 2021-08-29 DIAGNOSIS — J9621 Acute and chronic respiratory failure with hypoxia: Secondary | ICD-10-CM | POA: Diagnosis not present

## 2021-08-29 DIAGNOSIS — Z789 Other specified health status: Secondary | ICD-10-CM | POA: Diagnosis not present

## 2021-08-29 DIAGNOSIS — A419 Sepsis, unspecified organism: Secondary | ICD-10-CM | POA: Diagnosis not present

## 2021-08-29 DIAGNOSIS — R6521 Severe sepsis with septic shock: Secondary | ICD-10-CM | POA: Diagnosis not present

## 2021-08-29 DIAGNOSIS — I5032 Chronic diastolic (congestive) heart failure: Secondary | ICD-10-CM | POA: Diagnosis not present

## 2021-08-29 NOTE — Patient Instructions (Signed)
Continue checking his blood pressure If it is low okay to hold Demadex and potassium 1 or 2 times but if the low blood pressure continue he needs to seek medical attention. Also seek medical attention if fever, chills, unable to get the oxygen in the 90s, severe chest pain, leg swelling BP GOAL is between 110/65 and  135/85.   GO TO THE LAB : Get the blood work

## 2021-08-29 NOTE — Progress Notes (Signed)
Subjective:    Patient ID: Sean Riley, male    DOB: Mar 20, 1951, 71 y.o.   MRN: [id number]  DOS:  08/29/2021 Type of visit - description: TCM 7  Admitted to hospital and discharged a week later on 08/22/2021:  Initial symptoms confusion, cough, shortness of breath, hypoxia. Admitted to ICU. He had the following diagnosis: - Septic shock due to Enterococcus faecalis bacteremia.  Source unclear.  History of bioprosthetic aortic valve. TEE with no vegetation. - Acute on chronic hypoxic respiratory failure, needing up to 5 L of oxygen with ambulation. - Acute on chronic diastolic CHF, EF 0/01/2693 355%. - Kidney injury, creatinine at baseline between 1.3, 1.5. -Encephalopathy: Improved.   Review of Systems Since he left the hospital, he is under the care of his wife at home. Still short of breath. O2 sat drops significantly with exertion.  No fever chills Appetite is poor No chest pain. No edema. No diarrhea Has chronic cough, whitish sputum.  No hemoptysis.  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 LUTS    Hypertension    Microscopic hematuria    negative cystoscopy   NONSPECIFIC ABNORMAL ELECTROCARDIOGRAM    Pleural effusion on right    Pleural effusion, bilateral    S/P aortic dissection repair 03/24/2017   Biological Bentall aortic root replacement + resection and grafting of entire ascending aorta, transverse aortic arch and proximal descending thoracic aorta with elephant trunk distal anastomosis and debranching of aortic arch vessels   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   25 mm [facility] Ease bovine pericardial tissue valve and 28 mm Gelweave Valsalva  aortic root graft with reimplantation of left main and right coronary arteries   Varicose veins     Past Surgical History:  Procedure Laterality Date   APPENDECTOMY     CARDIAC CATHETERIZATION      X3;Dr Caryl Comes, last 01/20/12: mild non-obstructive CAD, normal LV systolic function   CARDIOVERSION N/A 05/31/2018   Procedure: CARDIOVERSION;  Surgeon: Sueanne Margarita, MD;  Location: [facility] ENDOSCOPY;  Service: Cardiovascular;  Laterality: N/A;   CARDIOVERSION N/A 06/13/2020   Procedure: CARDIOVERSION;  Surgeon: Larey Dresser, MD;  Location: [facility] ENDOSCOPY;  Service: Cardiovascular;  Laterality: N/A;   COLONOSCOPY     COLONOSCOPY N/A 01/27/2021   Procedure: COLONOSCOPY;  Surgeon: Gatha Mayer, MD;  Location: [facility] ENDOSCOPY;  Service: Endoscopy;  Laterality: N/A;   CYSTOSCOPY  1978   Dr Hartley Barefoot   ESOPHAGOGASTRODUODENOSCOPY N/A 01/27/2021   Procedure: ESOPHAGOGASTRODUODENOSCOPY (EGD);  Surgeon: Gatha Mayer, MD;  Location: [facility] ENDOSCOPY;  Service: Endoscopy;  Laterality: N/A;   EYE SURGERY     GIVENS CAPSULE STUDY N/A 01/27/2021   Procedure: GIVENS CAPSULE STUDY;  Surgeon: Gatha Mayer, MD;  Location: [place];  Service: Endoscopy;  Laterality: N/A;   IR THORACENTESIS ASP PLEURAL SPACE W/IMG GUIDE  11/25/2017   KNEE ARTHROSCOPY  2012   Dr Theda Sers   LUMBAR LAMINECTOMY  1987   Dr Lestine Box  2007   PERICARDIOCENTESIS N/A 04/07/2017   Procedure: PERICARDIOCENTESIS;  Surgeon: Sherren Mocha, MD;  Location: [place] CV LAB;  Service: Cardiovascular;  Laterality: N/A;   POLYPECTOMY     POLYPECTOMY  01/27/2021   Procedure: POLYPECTOMY;  Surgeon: Gatha Mayer, MD;  Location: [facility] ENDOSCOPY;  Service: Endoscopy;;   PRESSURE SENSOR/CARDIOMEMS N/A 12/26/2020   Procedure: PRESSURE SENSOR/CARDIOMEMS;  Surgeon: Larey Dresser, MD;  Location: [place] CV LAB;  Service: Cardiovascular;  Laterality: N/A;   REPAIR OF ACUTE ASCENDING THORACIC AORTIC DISSECTION N/A 03/23/2017    Procedure: REPAIR OF ACUTE ASCENDING THORACIC AORTIC DISSECTION.  Bentall procedure.  Aortic root repleacement with bioprosthetic valve.  Reimplantation of left and right coronary arteries.  Total resection of transverse aortic arch.  Elephant trunk distal anastomosis and debranching of arch vessels.;  Surgeon: Rexene Alberts, MD;  Location: [place];  Service: Vascular;  Laterality: N/A;   RIGHT HEART CATH N/A 06/15/2019   Procedure: RIGHT HEART CATH;  Surgeon: Larey Dresser, MD;  Location: [place] CV LAB;  Service: Cardiovascular;  Laterality: N/A;   ROTATOR CUFF REPAIR     SHOULDER ARTHROSCOPY  2011   Dr Theda Sers   SHOULDER ARTHROSCOPY Right 08/2015   SPINAL FUSION  1986   Dr Rolin Barry   TEE WITHOUT CARDIOVERSION N/A 03/23/2017   Procedure: TRANSESOPHAGEAL ECHOCARDIOGRAM (TEE);  Surgeon: Rexene Alberts, MD;  Location: [place];  Service: Open Heart Surgery;  Laterality: N/A;   TEE WITHOUT CARDIOVERSION N/A 08/21/2021   Procedure: TRANSESOPHAGEAL ECHOCARDIOGRAM (TEE);  Surgeon: Larey Dresser, MD;  Location: [facility] ENDOSCOPY;  Service: Cardiovascular;  Laterality: N/A;   THORACIC AORTIC ENDOVASCULAR STENT GRAFT N/A 03/30/2018   Procedure: THORACIC AORTIC ENDOVASCULAR STENT GRAFT WITH INTRAVASCULAR ULTRASOUND;  Surgeon: Serafina Mitchell, MD;  Location: [facility] OR;  Service: Vascular;  Laterality: N/A;   TRACHEOSTOMY     age 33 for croup    Current Outpatient Medications  Medication Instructions   albuterol (PROAIR HFA) 108 (90 Base) MCG/ACT inhaler 2 puffs, Inhalation, Every 4 hours PRN   ALPRAZolam (XANAX) 1 MG tablet TAKE 1 TABLET BY MOUTH AT BEDTIME.   apixaban (ELIQUIS) 5 mg, Oral, 2 times daily   bisacodyl (DULCOLAX) 5 mg, Oral, Daily PRN   Budeson-Glycopyrrol-Formoterol (BREZTRI AEROSPHERE) 160-9-4.8 MCG/ACT AERO 2 puffs, Inhalation, 2 times daily   escitalopram (LEXAPRO) 10 MG tablet TAKE 1 TABLET DAILY   esomeprazole (NEXIUM) 40 mg, Oral, Daily before breakfast   flecainide (TAMBOCOR) 75  mg, Oral, 2 times daily   fluticasone (FLONASE) 50 MCG/ACT nasal spray 1 spray, Each Nare, 2 times daily PRN, Use one spray in each nostril twice daily.   hydroxypropyl methylcellulose / hypromellose (ISOPTO TEARS / GONIOVISC) 2.5 % ophthalmic solution 1-2 drops, Both Eyes, 3 times daily PRN   ipratropium-albuterol (DUONEB) 0.5-2.5 (3) MG/3ML SOLN 3 mLs, Nebulization, Every 4 hours PRN   JARDIANCE 10 MG TABS tablet TAKE 1 TABLET DAILY BEFORE BREAKFAST   magnesium hydroxide (MILK OF MAGNESIA) 400 MG/5ML suspension 7.5 mLs, Oral, Daily PRN   metoprolol tartrate (LOPRESSOR) 12.5 mg, Oral, 2 times daily, Please make yearly appt with Dr. Caryl Comes for January 2022 for future refills. Thank you 1st attempt   OXYGEN 5 L/min, Inhalation, Continuous   potassium chloride SA (KLOR-CON M) 20 MEQ tablet 40 mEq, Oral, Daily at bedtime   rosuvastatin (CRESTOR) 5 MG tablet TAKE 1 TABLET DAILY   torsemide (DEMADEX) 60 mg, Oral, Daily   triamcinolone cream (KENALOG) 0.5 % 1 application, Topical, 3 times daily       Objective:   Physical Exam BP (!) 120/58 (BP Location: Right Arm, Patient Position: Sitting,  Cuff Size: Small)    Pulse 77    Temp 97.7 F (36.5 C) (Oral)    Resp 20    Ht 6\' 1"  (1.854 m)    Wt 177 lb 8 oz (80.5 kg)    SpO2 (!) 87% Comment: 6L/min   BMI 23.42 kg/m  General:   Well developed, NAD, BMI noted. HEENT:  Normocephalic . Face symmetric, atraumatic Lungs:  Significant decreased breath sounds B Heart: RRR, systolic murmur noted.  Lower extremities: no pretibial edema bilaterally  Skin: Not pale. Not jaundice Neurologic:  alert & oriented X3.  Speech normal, gait not tested Psych--  Cognition and judgment appear intact.  Cooperative with normal attention span and concentration.  Behavior appropriate. No anxious or depressed appearing.      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 Loanne Drilling  >> Bx (-) 10-2014 ; took tamoxifen temporarily (self d/c 06-2016) ---Again 09/2019, labs/MMG done, on [place] agai Dermatology : eczema? rx taclonex H/o  Tracheostomy , age 19, croup Chronic anemia:  Chronic anemia per chart review,+ iron deficient (08-2018). S/p  colonoscopy 05-2016, multiple polyps. EGD 2014 for chest pain: Negative, bx results? Admitted 01/2021: Hemoglobin of 5.  Work-up negative.  PLAN: TCM 7 Septic shock due to Enterococcus faecalis bacteremia with negative TEE for vegetation, acute on chronic hypoxic respiratory failure, kidney injury: -The patient left the hospital a few days ago, now under the care of his wife at home. -Few days ago we were notified that he was hypotensive, was recommended to go to the ER, patient declined. -At this time, wife is checking his BP very frequently at home and she is getting normal readings. -Wife reports O2 sats with minimal exertion drops sometimes to the 70s but at rest is able to go to the low to mid 90s  w/ up to  6 L of O2. -Upon arrival today, oxygen was 83%, I rechecked after resting and after wife increased oxygen to approximately 6 L, 87%. -While I am concerned about his low O2, his chronic SOB seems to be at baseline.   - Volume status: He has no lower extremity edema, his weight today is actually several pounds down compared to the last time I saw him. PLAN CMP, CBC.  To finish antibiotics today Plans to see  cards and ID Will let pulmonary know that he is a still hypoxic. ER is fever, chills, unable to get oxygen in the 90s. Goals of care: I asked patient what his wishes are, he said likes everything done (even intubation)   RTC 2 months.    This visit occurred during the SARS-CoV-2 public health emergency.  Safety protocols were in place, including screening questions prior to the visit, additional usage of staff PPE, and extensive cleaning of exam room while observing appropriate contact time as indicated for disinfecting solutions.

## 2021-08-30 LAB — CBC WITH DIFFERENTIAL/PLATELET
Absolute Monocytes: 728 cells/uL (ref 200–950)
Basophils Absolute: 43 cells/uL (ref 0–200)
Basophils Relative: 0.4 %
Eosinophils Absolute: 171 cells/uL (ref 15–500)
Eosinophils Relative: 1.6 %
HCT: 40 % (ref 38.5–50.0)
Hemoglobin: 12.7 g/dL — ABNORMAL LOW (ref 13.2–17.1)
Lymphs Abs: 546 cells/uL — ABNORMAL LOW (ref 850–3900)
MCH: 26.3 pg — ABNORMAL LOW (ref 27.0–33.0)
MCHC: 31.8 g/dL — ABNORMAL LOW (ref 32.0–36.0)
MCV: 83 fL (ref 80.0–100.0)
MPV: 8.7 fL (ref 7.5–12.5)
Monocytes Relative: 6.8 %
Neutro Abs: 9213 cells/uL — ABNORMAL HIGH (ref 1500–7800)
Neutrophils Relative %: 86.1 %
Platelets: 263 10*3/uL (ref 140–400)
RBC: 4.82 10*6/uL (ref 4.20–5.80)
RDW: 15.3 % — ABNORMAL HIGH (ref 11.0–15.0)
Total Lymphocyte: 5.1 %
WBC: 10.7 10*3/uL (ref 3.8–10.8)

## 2021-08-30 LAB — COMPREHENSIVE METABOLIC PANEL
AG Ratio: 1.1 (calc) (ref 1.0–2.5)
ALT: 13 U/L (ref 9–46)
AST: 19 U/L (ref 10–35)
Albumin: 4 g/dL (ref 3.6–5.1)
Alkaline phosphatase (APISO): 99 U/L (ref 35–144)
BUN/Creatinine Ratio: 18 (calc) (ref 6–22)
BUN: 30 mg/dL — ABNORMAL HIGH (ref 7–25)
CO2: 28 mmol/L (ref 20–32)
Calcium: 9.1 mg/dL (ref 8.6–10.3)
Chloride: 97 mmol/L — ABNORMAL LOW (ref 98–110)
Creat: 1.68 mg/dL — ABNORMAL HIGH (ref 0.70–1.28)
Globulin: 3.6 g/dL (calc) (ref 1.9–3.7)
Glucose, Bld: 102 mg/dL — ABNORMAL HIGH (ref 65–99)
Potassium: 4.6 mmol/L (ref 3.5–5.3)
Sodium: 136 mmol/L (ref 135–146)
Total Bilirubin: 1 mg/dL (ref 0.2–1.2)
Total Protein: 7.6 g/dL (ref 6.1–8.1)

## 2021-08-31 NOTE — Assessment & Plan Note (Signed)
TCM 7 Septic shock due to Enterococcus faecalis bacteremia with negative TEE for vegetation, acute on chronic hypoxic respiratory failure, kidney injury: -The patient left the hospital a few days ago, now under the care of his wife at home. -Few days ago we were notified that he was hypotensive, was recommended to go to the ER, patient declined. -At this time, wife is checking his BP very frequently at home and she is getting normal readings. -Wife reports O2 sats with minimal exertion drops sometimes to the 70s but at rest is able to go to the low to mid 90s  w/ up to  6 L of O2. -Upon arrival today, oxygen was 83%, I rechecked after resting and after wife increased oxygen to approximately 6 L, 87%. -While I am concerned about his low O2, his chronic SOB seems to be at baseline.   - Volume status: He has no lower extremity edema, his weight today is actually several pounds down compared to the last time I saw him. PLAN CMP, CBC.  To finish antibiotics today Plans to see cards and ID Will let pulmonary know that he is a still hypoxic. ER is fever, chills, unable to get oxygen in the 90s. Goals of care: I asked patient what his wishes are, he said likes everything done (even intubation)   RTC 2 months.

## 2021-09-01 DIAGNOSIS — J181 Lobar pneumonia, unspecified organism: Secondary | ICD-10-CM | POA: Diagnosis not present

## 2021-09-01 DIAGNOSIS — J441 Chronic obstructive pulmonary disease with (acute) exacerbation: Secondary | ICD-10-CM | POA: Diagnosis not present

## 2021-09-01 DIAGNOSIS — I480 Paroxysmal atrial fibrillation: Secondary | ICD-10-CM | POA: Diagnosis not present

## 2021-09-01 DIAGNOSIS — N1832 Chronic kidney disease, stage 3b: Secondary | ICD-10-CM | POA: Diagnosis not present

## 2021-09-01 DIAGNOSIS — I5033 Acute on chronic diastolic (congestive) heart failure: Secondary | ICD-10-CM | POA: Diagnosis not present

## 2021-09-01 DIAGNOSIS — Z9981 Dependence on supplemental oxygen: Secondary | ICD-10-CM

## 2021-09-01 DIAGNOSIS — J440 Chronic obstructive pulmonary disease with acute lower respiratory infection: Secondary | ICD-10-CM | POA: Diagnosis not present

## 2021-09-01 DIAGNOSIS — Z7901 Long term (current) use of anticoagulants: Secondary | ICD-10-CM

## 2021-09-01 DIAGNOSIS — A419 Sepsis, unspecified organism: Secondary | ICD-10-CM

## 2021-09-01 DIAGNOSIS — Z952 Presence of prosthetic heart valve: Secondary | ICD-10-CM

## 2021-09-01 DIAGNOSIS — G4730 Sleep apnea, unspecified: Secondary | ICD-10-CM

## 2021-09-01 DIAGNOSIS — K5790 Diverticulosis of intestine, part unspecified, without perforation or abscess without bleeding: Secondary | ICD-10-CM

## 2021-09-01 DIAGNOSIS — A4181 Sepsis due to Enterococcus: Secondary | ICD-10-CM | POA: Diagnosis not present

## 2021-09-01 DIAGNOSIS — Z7951 Long term (current) use of inhaled steroids: Secondary | ICD-10-CM

## 2021-09-01 DIAGNOSIS — I130 Hypertensive heart and chronic kidney disease with heart failure and stage 1 through stage 4 chronic kidney disease, or unspecified chronic kidney disease: Secondary | ICD-10-CM | POA: Diagnosis not present

## 2021-09-01 DIAGNOSIS — M1990 Unspecified osteoarthritis, unspecified site: Secondary | ICD-10-CM

## 2021-09-01 DIAGNOSIS — Z87891 Personal history of nicotine dependence: Secondary | ICD-10-CM

## 2021-09-01 DIAGNOSIS — Z9181 History of falling: Secondary | ICD-10-CM

## 2021-09-01 DIAGNOSIS — N400 Enlarged prostate without lower urinary tract symptoms: Secondary | ICD-10-CM

## 2021-09-01 DIAGNOSIS — J9621 Acute and chronic respiratory failure with hypoxia: Secondary | ICD-10-CM | POA: Diagnosis not present

## 2021-09-01 DIAGNOSIS — R6521 Severe sepsis with septic shock: Secondary | ICD-10-CM | POA: Diagnosis not present

## 2021-09-01 DIAGNOSIS — K219 Gastro-esophageal reflux disease without esophagitis: Secondary | ICD-10-CM

## 2021-09-01 DIAGNOSIS — D631 Anemia in chronic kidney disease: Secondary | ICD-10-CM | POA: Diagnosis not present

## 2021-09-01 DIAGNOSIS — N179 Acute kidney failure, unspecified: Secondary | ICD-10-CM | POA: Diagnosis not present

## 2021-09-01 NOTE — Progress Notes (Signed)
PCP: Colon Branch, MD Cardiology: Dr. Caryl Comes HF Cardiology: Dr. Aundra Dubin  71 y.o. with history of COPD, paroxysmal atrial fibrillation, type A aortic dissection, and chronic diastolic CHF was referred by Dr. Caryl Comes for evaluation of CHF.  Prior to 2018, patient reports no significant limitations.  In 8/18, he had a Type A aortic dissection.  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  The dissection extended down to the abdominal aorta.  He was a heavy smoker, quit in 8/18.  He developed paroxysmal atrial fibrillation, had DCCV in 10/19.  He is on Eliquis and flecainide now.  Last echo in 2/20 showed EF 55%, mildly decreased RV systolic function, normally functioning bioprosthetic aortic valve.  PFTs in 6/19 showed moderate-severe obstruction.    Ever since his operation in 2018, Mr Arscott has had significant exertional dyspnea and has been quite limited.    He had RHC in 11/20, showing normal filling pressures, preserved cardiac output, and mild primarily pulmonary venous hypertension.    PYP scan was not suggestive of cardiac amyloidosis.   He saw pulmonary, PFTs showed moderate to severe obstruction.  He is using oxygen with exertion now, which he says helps. He is using CPAP.   Echo in 5/21 showed EF 55%, basal inferior akinesis, mild RV dilation with normal function, PASP 46 mmHg, s/p Bentall, s/p bioprosthetic aortic valve with mean gradient 16 mmHg.   11/21 had DCCV to NSR.  Had been in atrial fibrillation for a few weeks.   He was admitted in 1/22 with CHF exacerbation and diuresed.  Echo in 1/22 showed EF 60-65%, moderate RV enlargement with mildly decreased RV systolic function, PASP 44, bioprosthetic AVR with mean gradient 18 mmHg.   Cardiomems implant 5/22.    Patient was admitted in 6/22 with GI bleeding.  EGD, colonoscopy, and capsule endoscopy were all unremarkable.  No source of bleeding identified.   Admitted 1/23 with  hypoxia, requiring BiPap, secondary to septic shock. Found to have enterococcal bacteremia.  ID following. No growth in blood cultures. AHF consulted and TEE did not show any vegetation. Echo on 08/16/2021 showed EF of 55 to 60%, moderately elevated pulmonary artery pressure, moderately enlarged right ventricle. He was diuresed and switched to torsemide. Hospitalization c/b AKI and paroxysmal atrial fibrillation. He was discharged on linezolid, torsemide and Toprol; weight 177 lbs.  Today he returns for post hospital HF follow up. Overall very fatigued, poor energy and appetite. Wife is encouraging him to drink Boost. He is on 5L oxygen at rest, increased needs with walking. Denies CP, dizziness, abnormal bleeding, edema, or palpitations. Chronically sleeps in recliner. Appetite ok. No fever or chills. Weight at home 178 pounds. Taking all medications. BP with PT 90s/40s, not wearing CPAP at night as he chronically sleeps in a recliner.  Cardiomems PADP 21 mmHg yesterday (goal 16 mmHg).    ECG (personally reviewed): NSR w/ RBBB 85 bpm, QTc 506 msec  Labs (8/20): LDL 74 Labs (10/20): Na 126, K 4.5, creatinine 1.15 Labs (11/20): K 3.8, creatinine 1.3 Labs (12/20): K 3.9, creatinine 1.5, myeloma panel negative Labs (3/21): K 4.1, creatinine 1.6, TSH normal Labs (5/21): BNP 142, creatinine 2 Labs (9/21): K 4.5, creatinine 1.8 Labs (10/21): K 4, creatinine 1.55, BNP 118 Labs (2/22): K 4.3, creatinine 1.85 Labs (4/22): K 4.2, creatinine 1.70 Labs (5/22): K 4.8, creatinine 1.95  Labs (7/22): K 3.7, creatinine 1.57, hgb 10.6 Labs (9/22): K 3.7, creatinine 1.34, hgb 11.8 Labs (1/23):  K 4.6, creatinine 1.68, hgb 12.7  PMH: 1. COPD: PFTs (6/19) with moderate-severe obstruction.  Prior heavy smoker, quit 2018.  - Uses home oxygen.  2. Atrial fibrillation: Paroxysmal. DCCV in 10/19, now on flecainide.  - DCCV to NSR in 11/21.  3. HTN 4. Chronic diastolic CHF: Echo (2/59) with EF 55%, mild LVH, mild LV  dilation, mild RV dilation with mildly decreased systolic function, bioprosthetic aortic valve with mean gradient 15 mmHg.  - RHC (11/20): mean RA 9, PA 41/15, mean PCWP 13, CI 2.81, PVR 2.15 WU.   - PYP scan not suggestive of cardiac amyloidosis.  - Echo (5/21): EF 55%, basal inferior akinesis, mild RV dilation with normal function, PASP 46 mmHg, s/p Bentall, s/p bioprosthetic aortic valve with mean gradient 16 mmHg.  - Echo (1/22): EF 60-65%, moderate RV enlargement with mildly decreased RV systolic function, PASP 44, bioprosthetic AVR with mean gradient 18 mmHg.  - Cardiomems implant 5/22.  RHC (5/22): mean RA 11, PA 58/16, PCWP mean 18, CI 2.56, PVR 2.95 WU.  - Echo (1/23): EF 55-60%, basal septal HK, mild LVH, RV low normal, moderately elevated pulmonary artery systolic pressure, severely dilated RA, moderate TR - TEE (1/23): EF 55-60%, RV mildly reduced, RV mildly enlarged, no thrombus, moderate MR/TR, no valvular vegetation. 5. Type A aortic dissection: 8/18, patient had emergency repair with Bentall/bioprosthetic aortic valve.  Dissection extends down to abdominal aorta.  Coronaries re-implanted. Complicated by pericardial effusion requiring pericardiocentesis.  6. Chronic hyponatremia.  7. OSA: He is using CPAP.  8. ABIs (7/21): Normal 9. GI bleeding: 6/22, EGD/c-scope/capsule endoscopy all unrevealing.   FH: Uncle with aortic dissection, mother with SCD at age 36.   SH: Lives [place] farm in [place].  Married, prior heavy smoker but quit in 2018.   ROS: All systems reviewed and negative except as per HPI.   Current Outpatient Medications  Medication Sig Dispense Refill   albuterol (PROAIR HFA) 108 (90 Base) MCG/ACT inhaler Inhale 2 puffs into the lungs every 4 (four) hours as needed for wheezing or shortness of breath. 54 g 3   ALPRAZolam (XANAX) 1 MG tablet Take 1 mg by mouth at bedtime.     apixaban (ELIQUIS) 5 MG TABS tablet Take 1 tablet (5 mg total) by mouth 2 (two) times  daily. 60 tablet 0   bisacodyl (DULCOLAX) 5 MG EC tablet Take 5 mg by mouth daily as needed for moderate constipation.     Budeson-Glycopyrrol-Formoterol (BREZTRI AEROSPHERE) 160-9-4.8 MCG/ACT AERO Inhale 2 puffs into the lungs in the morning and at bedtime. 32.1 g 3   escitalopram (LEXAPRO) 10 MG tablet TAKE 1 TABLET DAILY 90 tablet 3   esomeprazole (NEXIUM) 40 MG capsule Take 1 capsule (40 mg total) by mouth daily before breakfast. 90 capsule 3   flecainide (TAMBOCOR) 50 MG tablet Take 1.5 tablets (75 mg total) by mouth 2 (two) times daily.     fluticasone (FLONASE) 50 MCG/ACT nasal spray Place 1 spray into both nostrils 2 (two) times daily as needed for allergies. Use one spray in each nostril twice daily.     hydroxypropyl methylcellulose / hypromellose (ISOPTO TEARS / GONIOVISC) 2.5 % ophthalmic solution Place 1-2 drops into both eyes 3 (three) times daily as needed (dry/irritated eyes).     ipratropium-albuterol (DUONEB) 0.5-2.5 (3) MG/3ML SOLN Take 3 mLs by nebulization every 4 (four) hours as needed. 360 mL 11   JARDIANCE 10 MG TABS tablet TAKE 1 TABLET DAILY BEFORE BREAKFAST 90 tablet 3  magnesium hydroxide (MILK OF MAGNESIA) 400 MG/5ML suspension Take 7.5 mLs by mouth daily as needed (stomach cramps/constipation).     metoprolol tartrate (LOPRESSOR) 25 MG tablet Take 0.5 tablets (12.5 mg total) by mouth 2 (two) times daily. Please make yearly appt with Dr. Caryl Comes for January 2022 for future refills. Thank you 1st attempt 180 tablet 1   OXYGEN Inhale 5 L/min into the lungs continuous.     potassium chloride SA (KLOR-CON M) 20 MEQ tablet Take 40 mEq by mouth at bedtime.     rosuvastatin (CRESTOR) 5 MG tablet Take 5 mg by mouth at bedtime.     torsemide (DEMADEX) 20 MG tablet Take 3 tablets (60 mg total) by mouth daily.     triamcinolone cream (KENALOG) 0.5 % Apply 1 application topically 3 (three) times daily. (Patient taking differently: Apply 1 application topically 3 (three) times daily.  As needed)     No current facility-administered medications for this encounter.   Wt Readings from Last 3 Encounters:  09/02/21 80.6 kg (177 lb 12.8 oz)  08/29/21 80.5 kg (177 lb 8 oz)  08/22/21 89.5 kg (197 lb 5 oz)   BP 116/70    Pulse 92    Wt 80.6 kg (177 lb 12.8 oz)    SpO2 94%    BMI 23.46 kg/m   Physical Exam: General:  NAD. No resp difficulty, arrived in [facility] on oxygen, elderly HEENT: Normal Neck: Supple. No JVD. Carotids 2+ bilat; no bruits. No lymphadenopathy or thryomegaly appreciated. Cor: PMI nondisplaced. Regular rate & rhythm. No rubs, gallops or murmurs. Lungs: Clear Abdomen: Soft, nontender, nondistended. No hepatosplenomegaly. No bruits or masses. Good bowel sounds. Extremities: No cyanosis, clubbing, rash, edema Neuro: Alert & oriented x 3, cranial nerves grossly intact. Moves all 4 extremities w/o difficulty. Affect pleasant.  Assessment/Plan: 1. H/o Sepsis/Bacteremia/Presumed Endocarditis:  Blood Cultures + enterococcus faecalis. Source uncertain. Treated presumptively for endocarditis. Has bioprosthetic AoV. Echo (1/23) w/ mod TR, mild MR. Prosthetic AoV not well visualized due to  poor acoustic windows. TEE (1/23) with no endocarditis. - Completed linezolid per ID. - No further fevers. 2. Chronic Hypoxic Respiratory Failure: multifactorial. Moderate COPD on home O2 (4L/min baseline) + chronic diastolic heart failure. Increased O2 requirements this past admit, in setting of sepsis s/p IVF resuscitation and ? LLL PNA.  - Now on 5L oxygen continuously (prior baseline 3.5 L/min).  - Continue torsemide 60 mg daily. 3. Chronic Diastolic Heart Failure: Echo in 2/20 with EF 55%, mildly decreased RV systolic function, normally functioning bioprosthetic aortic valve. RHC in 11/20 after increasing torsemide showed near-normal filling pressures.  PYP study was not suggestive of ATTR amyloidosis and myeloma panel was negative. Echo in 1/22 showed EF 60-65%, moderate RV  enlargement with mildly decreased RV systolic function, PASP 44, bioprosthetic AVR with mean gradient 18 mmHg. Echo this admit, EF 55-60% RV moderately enlarged w/ moderately elevated RVSP 55.4 mmHg, RA severely dilated, mod TR, mild MR. Prosthetic AoV not well visualized, poor acoustic windows. Peak and mean gradients through the valve are 5 and 2 mm Hg respectively. Previous echo in May 2022, peak and mean gradients were 34 and 18 mm Hg respectively. Chronic NYHA IIIb, he is not volume overloaded on exam, weight is stable. Not sending Cardiomems regularly, but last PAd 21. - Continue torsemide 60 mg daily. Labs 08/29/21 reviewed and SCr 1.68 and K 4.6. Check BNP today. - Continue metoprolol 12.5 mg bid. - Continue Jardiance 10 mg daily. - I asked him  to send Cardiomems transmissions daily. 4. 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 Roxy Manns. Echo 1/22 showed stable bioprosthetic aortic valve.  Dr. Trula Slade has been following his residual dissected aorta.  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 5. Atrial fibrillation: Paroxysmal.  S/p DCCV in 11/21.  NSR on ECG today. - Continue flecainide + metoprolol. QTc 506 msec on ECG today. - Continue Eliquis 5 mg bid. CBC on 08/29/21 reviewed and looks ok, Hgb 12.7 6. COPD: Moderate to severe obstruction on 6/19 PFTs, prior smoker.  He is on home oxygen. Followed by pulmonology   7. OSA: Encouraged compliance with CPAP. 8 Physical Deconditioning: Continue HH PT. Continue nutrient/calorie-dense foods.   Follow up in 6 weeks with APP (+/- restart losartan) and 3 months with Dr. Aundra Dubin.   Allena Katz, FNP-BC 09/02/21

## 2021-09-02 ENCOUNTER — Encounter (HOSPITAL_COMMUNITY): Payer: Self-pay

## 2021-09-02 ENCOUNTER — Other Ambulatory Visit: Payer: Self-pay

## 2021-09-02 ENCOUNTER — Ambulatory Visit (HOSPITAL_COMMUNITY)
Admit: 2021-09-02 | Discharge: 2021-09-02 | Disposition: A | Discharge status: Home / Self Care | Payer: Medicare Other | Attending: Family Medicine | Admitting: Family Medicine

## 2021-09-02 VITALS — BP 116/70 | HR 92 | Wt 177.8 lb

## 2021-09-02 DIAGNOSIS — Z8679 Personal history of other diseases of the circulatory system: Secondary | ICD-10-CM

## 2021-09-02 DIAGNOSIS — I7123 Aneurysm of the descending thoracic aorta, without rupture: Secondary | ICD-10-CM | POA: Diagnosis not present

## 2021-09-02 DIAGNOSIS — Z79899 Other long term (current) drug therapy: Secondary | ICD-10-CM | POA: Diagnosis not present

## 2021-09-02 DIAGNOSIS — Z9981 Dependence on supplemental oxygen: Secondary | ICD-10-CM | POA: Insufficient documentation

## 2021-09-02 DIAGNOSIS — J9611 Chronic respiratory failure with hypoxia: Secondary | ICD-10-CM

## 2021-09-02 DIAGNOSIS — R5381 Other malaise: Secondary | ICD-10-CM | POA: Diagnosis not present

## 2021-09-02 DIAGNOSIS — Z87891 Personal history of nicotine dependence: Secondary | ICD-10-CM | POA: Diagnosis not present

## 2021-09-02 DIAGNOSIS — Z952 Presence of prosthetic heart valve: Secondary | ICD-10-CM | POA: Diagnosis not present

## 2021-09-02 DIAGNOSIS — Z953 Presence of xenogenic heart valve: Secondary | ICD-10-CM | POA: Diagnosis not present

## 2021-09-02 DIAGNOSIS — I4510 Unspecified right bundle-branch block: Secondary | ICD-10-CM | POA: Insufficient documentation

## 2021-09-02 DIAGNOSIS — I5032 Chronic diastolic (congestive) heart failure: Secondary | ICD-10-CM | POA: Diagnosis not present

## 2021-09-02 DIAGNOSIS — I480 Paroxysmal atrial fibrillation: Secondary | ICD-10-CM

## 2021-09-02 DIAGNOSIS — I110 Hypertensive heart disease with heart failure: Secondary | ICD-10-CM | POA: Diagnosis not present

## 2021-09-02 DIAGNOSIS — J449 Chronic obstructive pulmonary disease, unspecified: Secondary | ICD-10-CM

## 2021-09-02 DIAGNOSIS — G4733 Obstructive sleep apnea (adult) (pediatric): Secondary | ICD-10-CM | POA: Diagnosis not present

## 2021-09-02 DIAGNOSIS — J440 Chronic obstructive pulmonary disease with acute lower respiratory infection: Secondary | ICD-10-CM | POA: Insufficient documentation

## 2021-09-02 DIAGNOSIS — Z7901 Long term (current) use of anticoagulants: Secondary | ICD-10-CM | POA: Insufficient documentation

## 2021-09-02 LAB — BRAIN NATRIURETIC PEPTIDE: B Natriuretic Peptide: 26.8 pg/mL (ref 0.0–100.0)

## 2021-09-02 NOTE — Patient Instructions (Signed)
Medication Changes:  No Changes in Medication  Lab Work:  Labs done today, your results will be available in MyChart, we will contact you for abnormal readings.   Testing/Procedures:    Referrals:  none  Special Instructions // Education:  Please wear your CPAP at night  Please send Cadiomems daily  Follow-Up in: 6 wks in Clinic and 3 months with Dr. Aundra Dubin  At the [facility], you and your health needs are our priority. We have a designated team specialized in the treatment of Heart Failure. This Care Team includes your primary Heart Failure Specialized Cardiologist (physician), Advanced Practice Providers (APPs- Physician Assistants and Nurse Practitioners), and Pharmacist who all work together to provide you with the care you need, when you need it.   You may see any of the following providers on your designated Care Team at your next follow up:  Dr Glori Bickers Dr Haynes Kerns, NP Lyda Jester, [place] [facility] [place], [place] Audry Riles, PharmD   Please be sure to bring in all your medications bottles to every appointment.   Need to Contact us:  If you have any questions or concerns before your next appointment please send [name] a message through [place] or call our office at [phone].    TO LEAVE A MESSAGE FOR THE NURSE SELECT OPTION 2, PLEASE LEAVE A MESSAGE INCLUDING: YOUR NAME DATE OF BIRTH CALL BACK NUMBER REASON FOR CALL**this is important as we prioritize the call backs  YOU WILL RECEIVE A CALL BACK THE SAME DAY AS LONG AS YOU CALL BEFORE 4:00 PM

## 2021-09-03 ENCOUNTER — Ambulatory Visit (INDEPENDENT_AMBULATORY_CARE_PROVIDER_SITE_OTHER): Payer: Medicare Other | Admitting: Adult Health

## 2021-09-03 ENCOUNTER — Encounter: Payer: Self-pay | Admitting: Adult Health

## 2021-09-03 VITALS — BP 106/60 | HR 91 | Temp 97.5°F | Ht 73.0 in | Wt 176.8 lb

## 2021-09-03 DIAGNOSIS — B952 Enterococcus as the cause of diseases classified elsewhere: Secondary | ICD-10-CM

## 2021-09-03 DIAGNOSIS — R6521 Severe sepsis with septic shock: Secondary | ICD-10-CM

## 2021-09-03 DIAGNOSIS — A419 Sepsis, unspecified organism: Secondary | ICD-10-CM | POA: Diagnosis not present

## 2021-09-03 DIAGNOSIS — E43 Unspecified severe protein-calorie malnutrition: Secondary | ICD-10-CM

## 2021-09-03 DIAGNOSIS — J449 Chronic obstructive pulmonary disease, unspecified: Secondary | ICD-10-CM

## 2021-09-03 DIAGNOSIS — J431 Panlobular emphysema: Secondary | ICD-10-CM | POA: Diagnosis not present

## 2021-09-03 DIAGNOSIS — J439 Emphysema, unspecified: Secondary | ICD-10-CM

## 2021-09-03 DIAGNOSIS — R7881 Bacteremia: Secondary | ICD-10-CM | POA: Diagnosis not present

## 2021-09-03 DIAGNOSIS — J9621 Acute and chronic respiratory failure with hypoxia: Secondary | ICD-10-CM | POA: Diagnosis not present

## 2021-09-03 NOTE — Assessment & Plan Note (Signed)
Recommend high-protein diet 

## 2021-09-03 NOTE — Assessment & Plan Note (Signed)
Recent decompensation with bacteremia.  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  Has been having increased oxygen demands since hospitalization. Patient is continue on triple therapy.  May use nebulizers up to 4 times a day as needed. He is severely deconditioned.  Agree with ongoing physical therapy and occupational therapy. Long discussion regarding code status, EOL discussion regarding living will and HCPOA.   Plan  Patient Instructions  Continue on BREZTRI 2 puffs Twice daily, rinse after use.  Duoneb Four times a day  As needed   Activity as tolerated, begin PT/OT as planned this week.  Continue on Oxygen 5l/m at rest and 8 l/m with activity , goal is O2 sats >88%.  High protein diet   Boost between meals Twice daily   Follow up with with Dr. Vaughan Browner in 3-4 weeks and As needed   Please contact office for sooner follow up if symptoms do not improve or worsen or seek emergency care

## 2021-09-03 NOTE — Assessment & Plan Note (Signed)
Recent hospitalization with severe sepsis and septic shock due to Enterococcus bacteremia-patient is clinically improved after antibiotics.  Transesophageal echo showed no valvular vegetation.  CT abdomen and pelvis without identifiable cause. Patient remains very weak but is slowly improving.  No further antibiotics at this time.  Patient to follow-up with infectious disease next week as planned  Plan  Patient Instructions  Continue on BREZTRI 2 puffs Twice daily, rinse after use.  Duoneb Four times a day  As needed   Activity as tolerated, begin PT/OT as planned this week.  Continue on Oxygen 5l/m at rest and 8 l/m with activity , goal is O2 sats >88%.  High protein diet   Boost between meals Twice daily   Follow up with with Dr. Vaughan Browner in 3-4 weeks and As needed   Please contact office for sooner follow up if symptoms do not improve or worsen or seek emergency care

## 2021-09-03 NOTE — Patient Instructions (Addendum)
Continue on BREZTRI 2 puffs Twice daily, rinse after use.  Duoneb Four times a day  As needed   Activity as tolerated, begin PT/OT as planned this week.  Continue on Oxygen 5l/m at rest and 8 l/m with activity , goal is O2 sats >88%.  High protein diet   Boost between meals Twice daily   Follow up with with Dr. Vaughan Browner in 3-4 weeks and As needed   Please contact office for sooner follow up if symptoms do not improve or worsen or seek emergency care

## 2021-09-03 NOTE — Assessment & Plan Note (Signed)
Recent decompensation with increased oxygen demands.  Patient is now requiring 5 L at rest and up to 8 L with activity.  Prior to admission was on 3 to 4 L at home.  Patient is continue on current settings to keep O2 saturations greater than 88%.  Patient to follow-up in 3 to 4 weeks and as needed  Plan Patient Instructions  Continue on BREZTRI 2 puffs Twice daily, rinse after use.  Duoneb Four times a day  As needed   Activity as tolerated, begin PT/OT as planned this week.  Continue on Oxygen 5l/m at rest and 8 l/m with activity , goal is O2 sats >88%.  High protein diet   Boost between meals Twice daily   Follow up with with Dr. Vaughan Browner in 3-4 weeks and As needed   Please contact office for sooner follow up if symptoms do not improve or worsen or seek emergency care

## 2021-09-03 NOTE — Progress Notes (Addendum)
@Patient  ID: Sean Riley, male    DOB: Nov 03, 1950, 71 y.o.   MRN: [id number]  Chief Complaint  Patient presents with   Follow-up   Hospitalization Follow-up    Referring provider: Colon Branch, MD  HPI: 71 year old male with history of heavy smoking followed for COPD, bilateral pleural effusions, chronic respiratory failure on home oxygen Medical history significant for atrial fib on Eliquis, history of thoracic aortic aneurysm status post endovascular repair in 2019, history of GI bleed, aortic valve replacement  TEST/EVENTS :  CT 03/22/17- moderate emphysema, left upper lobe 3.6 mm nodule.   CT angio 08/23/17-emphysema, large-moderate right and left effusion.   Screening CT 10/11/2019-lobular emphysema, calcified granuloma in the right upper lobe.  Small partially loculated bilateral effusions with scarring.   CTA 01/29/2020-stable emphysema, bilateral effusions   Screening CT chest 11/13/2020-small stable pulmonary nodules, atelectasis, small bilateral effusions, emphysema.   CT abdomen pelvis 01/29/2021-increase in size of effusions.   Chest x-ray 04/15/2021-chronic interstitial changes, loculated effusion.   01/05/17-FVC 4.02 [80%], FEV1 3.03 [81%], F/F 75 12/24/15-FVC 4.27 [91%], FEV1 2.39 [86%], F/F 79 Normal spirometry   12/25/13 FVC 4.96 [103%], FEV1 3.51 [97%], F/F 71, p.o. 314%, DLCO 72% Minimal obstruction, small airway disease, minimal diffusion defect   01/25/2018 FVC 2.92 (62%], FEV1 1.98 [57%], F/F 68, TLC 66%, DLCO 32% Moderate-severe obstruction, severe restriction and diffusion defect.   Labs Blood allergy profile 01/25/2018- IgE 841, sensitive to grass, cat, dog, tree pollen CBC 01/25/2018- WBC 6.7, eos 4.8%, absolute eosinophil count 300 Alpha-1 antitrypsin 01/25/2018- 166, PI MM Pets: Dog, no birds, [place]. Occupation: Retired Museum/gallery curator. Exposures: No known exposures, no mold, he has a hot tub but does not use it Smoking history:  90-pack-year smoking history.  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  He is maintained on Breztri for maintenance inhaler. Patient was hospitalized earlier this month for severe sepsis with septic shock secondary to Enterococcus faecalis bacteremia unclear source.  As above patient does have a bioprosthetic aortic valve.  Infectious disease followed during his hospitalization.  He was treated with aggressive IV antibiotics.  Transesophageal echo showed no valve vegetation.  Urine culture was negative CT abdomen and pelvis without obvious source.  Follow-up blood culture showed no growth.  Patient was discharged on Linezolid.  Has completed full course. Patient initially required BiPAP support and increased oxygen.  He was discharged on 5l/m rest and 8 l/m with activities   Patient did have decompensated congestive heart failure.  He was followed by cardiology.  And started on Demadex 60 mg daily.  Hospital stay was complicated by acute metabolic encephalopathy felt secondary to his underlying infection. Since discharge patient is feeling some better but remains very weak with no energy .  Lives at home with wife, she is his caregiver. Has family close by that helps some too.  Home health , PT/OT has been ordered. Coming later this week.  Appetite is very low. Weight is down 20lbs. Currently on boost Twice daily .  Has follow up with ID next week. Marland Kitchen Has finished all antibiotics. No increased cough. No fever.  Using Duoneb Four times a day  . This seems to be helping a lot to get up mucus.  Gets winded with minimal activity and rest . Needs helps with ADLs .  We discussed living will, HCPOA, CODE status and Palliative /Hospice. Long discussion regarding his current status with high oxygen demands, severe COPD along with CHF and  risk for decompensation.  They will think about it but are not in favor of DNR . "Want everything done not matter what" .    Allergies  Allergen Reactions   Simvastatin Other (See Comments)    Mental status changes   Celecoxib Other (See Comments)    GI UPSET AND INFLAMMATION   Codeine Other (See Comments)    GI UPSET AND INFLAMMATION   Nsaids Other (See Comments)    GI UPSET AND INFLAMMATION (can tolerate via IV)   Tape Other (See Comments)    Medical tape and Band-Aids PULL OFF THE SKIN; please use Coban wrap   Cephalexin Itching and Rash   Latex Rash    Immunization History  Administered Date(s) Administered   Fluad Quad(high Dose 65+) 04/27/2019, 05/20/2020, 05/16/2021   Influenza, High Dose Seasonal PF 06/05/2015, 05/18/2017, 05/18/2018   Influenza,inj,Quad PF,6+ Mos 04/24/2013, 05/10/2014   Influenza-Unspecified 06/10/2016   Moderna Covid-19 Vaccine Bivalent Booster 10yrs & up 05/16/2021   Moderna Sars-Covid-2 Vaccination 09/15/2019, 10/13/2019, 06/27/2020   Pneumococcal Conjugate-13 05/10/2014   Pneumococcal Polysaccharide-23 10/16/2013   Td 07/09/2006, 02/19/2008   Zoster, Live 08/10/2014    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 LUTS    Hypertension    Microscopic hematuria    negative cystoscopy   NONSPECIFIC ABNORMAL ELECTROCARDIOGRAM    Pleural effusion on right    Pleural effusion, bilateral    S/P aortic dissection repair 03/24/2017   Biological Bentall aortic root replacement + resection and grafting of entire ascending aorta, transverse aortic arch and proximal descending thoracic aorta with elephant trunk distal anastomosis and debranching of aortic arch vessels   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/2017   25 mm Edwards Magna Ease bovine pericardial tissue valve and 28 mm Gelweave Valsalva aortic root graft with reimplantation of left main and right coronary arteries   Varicose veins     Tobacco History: Social History   Tobacco Use  Smoking Status Former   Packs/day: 2.00   Years: 45.00   Pack years: 90.00   Types: Cigarettes   Quit date: 03/22/2017   Years since quitting: 4.4  Smokeless Tobacco Never  Tobacco Comments   2 ppd , quit 03/2017   Counseling given: Not Answered Tobacco comments: 2 ppd , quit 03/2017   Outpatient Medications Prior to Visit  Medication Sig Dispense Refill   albuterol (PROAIR HFA) 108 (90 Base) MCG/ACT inhaler Inhale 2 puffs into the lungs every 4 (four) hours as needed for wheezing or shortness of breath. 54 g 3   ALPRAZolam (XANAX) 1 MG tablet Take 1 mg by mouth at bedtime.     apixaban (ELIQUIS) 5 MG TABS tablet Take 1 tablet (5 mg total) by mouth 2 (two) times daily. 60 tablet 0   bisacodyl (DULCOLAX) 5 MG EC tablet Take 5 mg by mouth daily as needed for moderate constipation.     Budeson-Glycopyrrol-Formoterol (BREZTRI AEROSPHERE) 160-9-4.8 MCG/ACT AERO Inhale 2 puffs into the lungs in the morning  and at bedtime. 32.1 g 3   escitalopram (LEXAPRO) 10 MG tablet TAKE 1 TABLET DAILY 90 tablet 3   esomeprazole (NEXIUM) 40 MG capsule Take 1 capsule (40 mg total) by mouth daily before breakfast. 90 capsule 3   flecainide (TAMBOCOR) 50 MG tablet Take 1.5 tablets (75 mg total) by mouth 2 (two) times daily.     fluticasone (FLONASE) 50 MCG/ACT nasal spray Place 1 spray into both nostrils 2 (two) times daily as needed for allergies. Use one spray in each nostril twice daily.     hydroxypropyl methylcellulose / hypromellose (ISOPTO TEARS / GONIOVISC) 2.5 % ophthalmic solution Place 1-2 drops into both eyes 3 (three) times daily as needed (dry/irritated eyes).     ipratropium-albuterol (DUONEB) 0.5-2.5 (3) MG/3ML  SOLN Take 3 mLs by nebulization every 4 (four) hours as needed. 360 mL 11   JARDIANCE 10 MG TABS tablet TAKE 1 TABLET DAILY BEFORE BREAKFAST 90 tablet 3   magnesium hydroxide (MILK OF MAGNESIA) 400 MG/5ML suspension Take 7.5 mLs by mouth daily as needed (stomach cramps/constipation).     metoprolol tartrate (LOPRESSOR) 25 MG tablet Take 0.5 tablets (12.5 mg total) by mouth 2 (two) times daily. Please make yearly appt with Dr. Caryl Comes for January 2022 for future refills. Thank you 1st attempt 180 tablet 1   OXYGEN Inhale 5 L/min into the lungs continuous.     potassium chloride SA (KLOR-CON M) 20 MEQ tablet Take 40 mEq by mouth at bedtime.     rosuvastatin (CRESTOR) 5 MG tablet Take 5 mg by mouth at bedtime.     torsemide (DEMADEX) 20 MG tablet Take 3 tablets (60 mg total) by mouth daily.     triamcinolone cream (KENALOG) 0.5 % Apply 1 application topically 3 (three) times daily. (Patient taking differently: Apply 1 application topically 3 (three) times daily. As needed)     No facility-administered medications prior to visit.     Review of Systems:   Constitutional:   No  weight loss, night sweats,  Fevers, chills,  +fatigue, or  lassitude.  HEENT:   No headaches,  Difficulty swallowing,  Tooth/dental problems, or  Sore throat,                No sneezing, itching, ear ache, nasal congestion, post nasal drip,   CV:  No chest pain,  Orthopnea, PND, +swelling in lower extremities, no anasarca, dizziness, palpitations, syncope.   GI  No heartburn, indigestion, abdominal pain, nausea, vomiting, diarrhea, change in bowel habits, loss of appetite, bloody stools.   Resp: .  No chest wall deformity  Skin: no rash or lesions.  GU: no dysuria, change in color of urine, no urgency or frequency.  No flank pain, no hematuria   MS:  No joint pain or swelling.  No decreased range of motion.  No back pain.    Physical Exam  BP 106/60 (BP Location: Right Arm, Patient Position: Sitting, Cuff Size:  Normal)    Pulse 91    Temp (!) 97.5 F (36.4 C) (Oral)    Ht 6\' 1"  (1.854 m)    Wt 176 lb 12.8 oz (80.2 kg)    SpO2 91%    BMI 23.33 kg/m   GEN: A/Ox3; pleasant , NAD, chronically ill-appearing, on oxygen and wheelchair   HEENT:  [place]/AT,  , NOSE-clear, THROAT-clear, no lesions, no postnasal drip or exudate noted.   NECK:  Supple w/ fair ROM; no JVD; normal carotid impulses w/o bruits; no thyromegaly or nodules  palpated; no lymphadenopathy.    RESP  few trace rhonchi  no accessory muscle use, no dullness to percussion  CARD:  RRR, no m/r/g, tr  peripheral edema, pulses intact, no cyanosis or clubbing.  GI:   Soft & nt; nml bowel sounds; no organomegaly or masses detected.   Musco: Warm bil, no deformities or joint swelling noted.   Neuro: alert, no focal deficits noted.    Skin: Warm, no lesions or rashes     BMET  BNP  DG Chest 1 View  Result Date: 08/17/2021 CLINICAL DATA:  Edema EXAM: CHEST  1 VIEW COMPARISON:  Chest x-ray 08/15/2021, CT 08/16/2021, radiograph 04/15/2021 FINDINGS: Post sternotomy changes with aortic stent graft. Emphysema and chronic interstitial disease. Left-sided pleural effusion, similar right pleural effusion, effusions appears slightly loculated. Peripheral parenchymal opacities are also unchanged and could be due to chronic scarring. Cardiomegaly with vascular congestion. IMPRESSION: 1. Chronic bilateral effusions with slight increased pleural effusion on the left compared to prior. 2. Cardiomegaly with slight central congestion 3. Emphysema with chronic interstitial disease. Electronically Signed   By: Donavan Foil M.D.   On: 08/17/2021 16:56   DG Chest 2 View  Result Date: 08/15/2021 CLINICAL DATA:  Fever, malaise for 2 days, cough EXAM: CHEST - 2 VIEW COMPARISON:  Chest radiograph 04/15/2021 FINDINGS: Image quality is degraded by patient positioning, with the head overlying portions of the upper lobes. The cardiomediastinal silhouette is grossly stable,  with unchanged cardiomegaly. 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 There are bilateral pleural effusions, overall similar to prior studies. There are patchy opacities in the lung bases, improved on the right but worsened on the left since 04/15/2021. There is no definite pneumothorax. There is no acute osseous abnormality. IMPRESSION: Overall unchanged bilateral pleural effusions since 04/15/2021, but with worsened aeration in the left base. Findings could reflect left lower lobe pneumonia superimposed on chronic changes in the correct clinical setting. Electronically Signed   By: Valetta Mole M.D.   On: 08/15/2021 17:42   CT HEAD WO CONTRAST (5MM)  Result Date: 08/16/2021 CLINICAL DATA:  Altered mental status, nontraumatic (Ped 0-17y). Sepsis. EXAM: CT HEAD WITHOUT CONTRAST TECHNIQUE: Contiguous axial images were obtained from the base of the skull through the vertex without intravenous contrast. COMPARISON:  None. FINDINGS: Brain: There is atrophy and chronic small vessel disease changes. No acute intracranial abnormality. Specifically, no hemorrhage, hydrocephalus, mass lesion, acute infarction, or significant intracranial injury. Vascular: No hyperdense vessel or unexpected calcification. Skull: No acute calvarial abnormality. Sinuses/Orbits: No acute findings Other: None IMPRESSION: Atrophy, chronic microvascular disease. No acute intracranial abnormality. Electronically Signed   By: Rolm Baptise M.D.   On: 08/16/2021 01:12   CT CHEST WO CONTRAST  Result Date: 08/16/2021 CLINICAL DATA:  Sepsis, respiratory distress EXAM: CT CHEST WITHOUT CONTRAST TECHNIQUE: Multidetector CT imaging of the chest was performed following the standard protocol without IV contrast. COMPARISON:  01/15/2021 FINDINGS: Cardiovascular: Postoperative changes from aortic root and arch repair. Stable descending thoracic aortic aneurysm, 5.1 cm. Heart is normal size. Coronary artery  calcifications. Mediastinum/Nodes: Mildly prominent borderline sized mediastinal lymph nodes. Right paratracheal lymph node has a short axis diameter of 13 mm, stable. No axillary or visible hilar adenopathy. Lungs/Pleura: Small bilateral pleural effusions, similar to prior study. Peripheral airspace opacities in both lower lobes as well as right middle lobe and lingula, favor rounded atelectasis. Overall no change since prior study. Moderate centrilobular and paraseptal emphysema. Upper Abdomen: No acute  findings Musculoskeletal: Chest wall soft tissues are unremarkable. No acute bony abnormality. IMPRESSION: Stable small bilateral pleural effusions with peripheral somewhat rounded airspace opacities in both mid and lower lungs, favor rounded atelectasis and/or scarring. Postoperative changes from aortic repair related to aortic dissection. 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 Coronary artery disease. No definite acute process. Aortic Atherosclerosis (ICD10-I70.0) and Emphysema (ICD10-J43.9). Electronically Signed   By: Rolm Baptise M.D.   On: 08/16/2021 01:16   CT ABDOMEN PELVIS W CONTRAST  Result Date: 08/17/2021 CLINICAL DATA:  Sepsis EXAM: CT ABDOMEN AND PELVIS WITH CONTRAST TECHNIQUE: Multidetector CT imaging of the abdomen and pelvis was performed using the standard protocol following bolus administration of intravenous contrast. CONTRAST:  162mL OMNIPAQUE IOHEXOL 300 MG/ML  SOLN COMPARISON:  01/29/2021 FINDINGS: Lower chest: Cardiomegaly. Unchanged small, loculated bilateral pleural effusions associated rounded atelectasis. Hepatobiliary: No solid liver abnormality is seen. Gallstones and sludge contracted in the gallbladder. Gallbladder wall thickening, or biliary dilatation. Pancreas: Unremarkable. No pancreatic ductal dilatation or surrounding inflammatory changes. Spleen: Normal in size without significant abnormality. Adrenals/Urinary Tract: Adrenal glands are unremarkable. Kidneys are  normal, without renal calculi, solid lesion, or hydronephrosis. Foley catheter in the urinary bladder. Thickening of the urinary bladder wall, likely related to chronic outlet obstruction. Stomach/Bowel: Stomach is within normal limits. Appendix is not clearly visualized. No evidence of bowel wall thickening, distention, or inflammatory changes. Descending and sigmoid diverticulosis. Vascular/Lymphatic: Aortic atherosclerosis. 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 The abdominal aorta measures up to 4.7 x 4.0 cm in maximum caliber near the diaphragmatic hiatus. No enlarged abdominal or pelvic lymph nodes. Reproductive: Prostatomegaly. Other: Anasarca.  No abdominopelvic ascites. Musculoskeletal: No acute or significant osseous findings. IMPRESSION: 1. No acute CT findings of the abdomen or pelvis to explain sepsis. 2. Gallstones and sludge contracted in the gallbladder. No CT evidence of acute cholecystitis. 3. Descending and sigmoid diverticulosis without evidence of acute diverticulitis. 4. 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 The abdominal aorta measures up to 4.7 x 4.0 cm in maximum caliber near the diaphragmatic hiatus. Aortic Atherosclerosis (ICD10-I70.0). Electronically Signed   By: Delanna Ahmadi M.D.   On: 08/17/2021 16:25   ECHOCARDIOGRAM COMPLETE  Result Date: 08/16/2021    ECHOCARDIOGRAM REPORT   Patient Name:   Sean Riley Date of Exam: 08/16/2021 Medical Rec #:  [id number]        Height:       73.0 in Accession #:    [id number]       Weight:       191.4 lb Date of Birth:  [birth year]        BSA:          2.112 m Patient Age:    57 years         BP:           105/49 mmHg Patient Gender: M                HR:           64 bpm. Exam Location:   Inpatient Procedure: 2D Echo, Cardiac Doppler, Color Doppler and Intracardiac            Opacification Agent Indications:    Atrial fibrillation  History:        Patient has prior  history of Echocardiogram examinations, most                 recent 09/06/2020. CHF, COPD, Aortic Valve Disease; Risk                 Factors:Hypertension and Dyslipidemia. 25 mm Bovine valve in                 aortic position. Procedure date 03/23/17. GERD. Hx ascending                 aortic dissection, s/p repair.  Sonographer:    Clayton Lefort RDCS (AE) Referring Phys: [id number] Estill Cotta  Sonographer Comments: Technically difficult study due to poor echo windows. Image acquisition challenging due to COPD. IMPRESSIONS  1. Basal septal hypokinesis . Left ventricular ejection fraction, by estimation, is 55 to 60%. The left ventricle has normal function. The left ventricular internal cavity size was mildly dilated. There is mild left ventricular hypertrophy.  2. Right ventricular systolic function is low normal. The right ventricular size is moderately enlarged. There is moderately elevated pulmonary artery systolic pressure.  3. Right atrial size was severely dilated.  4. Mild mitral valve regurgitation.  5. Tricuspid valve regurgitation is moderate.  6. Poor acoustic windows limit study. 25 mm Edwards bovine valve present (03/23/2017) Difficult to see well. Peak and mean gradients through the valve are 5 and 2 mm Hg respectively Previous echo in May 2022, peak and mean gradients were 34 and 18 mm Hg respectively. Poor windows from current study, may not have attained optimal angle to measure gradients. Follow over time. . The aortic valve has been repaired/replaced. Aortic valve regurgitation is not visualized. Aortic valve sclerosis/calcification is present, without any evidence of aortic stenosis.  7. The inferior vena cava is dilated in size with <50% respiratory variability, suggesting right atrial pressure of 15 mmHg. FINDINGS  Left  Ventricle: Basal septal hypokinesis. Left ventricular ejection fraction, by estimation, is 55 to 60%. The left ventricle has normal function. Definity contrast agent was given IV to delineate the left ventricular endocardial borders. The left ventricular internal cavity size was mildly dilated. There is mild left ventricular hypertrophy. Right Ventricle: The right ventricular size is moderately enlarged. Right vetricular wall thickness was not assessed. Right ventricular systolic function is low normal. There is moderately elevated pulmonary artery systolic pressure. The tricuspid regurgitant velocity is 3.18 m/s, and with an assumed right atrial pressure of 15 mmHg, the estimated right ventricular systolic pressure is 53.6 mmHg. Left Atrium: Left atrial size was normal in size. Right Atrium: Right atrial size was severely dilated. Pericardium: There is no evidence of pericardial effusion. Mitral Valve: There is mild thickening of the mitral valve leaflet(s). Mild mitral annular calcification. Mild mitral valve regurgitation. Tricuspid Valve: The tricuspid valve is grossly normal. Tricuspid valve regurgitation is moderate. Aortic Valve: Poor acoustic windows limit study. 25 mm Edwards bovine valve present (03/23/2017) Difficult to see well. Peak and mean gradients through the valve are 5 and 2 mm Hg respectively Previous echo in May 2022, peak and mean gradients were 34 and  18 mm Hg respectively. Poor windows from current study, may not have attained optimal angle to measure gradients. Follow over time. The aortic valve has been repaired/replaced. Aortic valve regurgitation is not visualized. Aortic valve sclerosis/calcification is present, without any evidence of aortic stenosis. Aortic valve mean gradient measures 2.0 mmHg. Aortic valve peak gradient measures 4.6 mmHg. Aortic valve area, by VTI measures 5.04 cm. Pulmonic  Valve: The pulmonic valve was not well visualized. Aorta: The aortic root and ascending aorta  are structurally normal, with no evidence of dilitation. Venous: The inferior vena cava is dilated in size with less than 50% respiratory variability, suggesting right atrial pressure of 15 mmHg. IAS/Shunts: No atrial level shunt detected by color flow Doppler.  LEFT VENTRICLE PLAX 2D LVIDd:         5.40 cm LVIDs:         4.10 cm LV PW:         1.20 cm LV IVS:        1.40 cm LVOT diam:     2.50 cm LV SV:         76 LV SV Index:   36 LVOT Area:     4.91 cm  IVC IVC diam: 2.60 cm LEFT ATRIUM           Index        RIGHT ATRIUM           Index LA diam:      3.10 cm 1.47 cm/m   RA Area:     32.50 cm LA Vol (A4C): 64.7 ml 30.64 ml/m  RA Volume:   115.00 ml 54.46 ml/m  AORTIC VALVE AV Area (Vmax):    3.43 cm AV Area (Vmean):   3.28 cm AV Area (VTI):     5.04 cm AV Vmax:           107.00 cm/s AV Vmean:          71.900 cm/s AV VTI:            0.151 m AV Peak Grad:      4.6 mmHg AV Mean Grad:      2.0 mmHg LVOT Vmax:         74.80 cm/s LVOT Vmean:        48.100 cm/s LVOT VTI:          0.155 m LVOT/AV VTI ratio: 1.03  AORTA Ao Root diam: 3.10 cm Ao Asc diam:  3.60 cm TRICUSPID VALVE TR Peak grad:   40.4 mmHg TR Vmax:        318.00 cm/s  SHUNTS Systemic VTI:  0.16 m Systemic Diam: 2.50 cm Dorris Carnes MD Electronically signed by Dorris Carnes MD Signature Date/Time: 08/16/2021/12:56:09 PM    Final    ECHO TEE  Result Date: 08/21/2021    TRANSESOPHOGEAL ECHO REPORT   Patient Name:   Sean Riley Date of Exam: 08/21/2021 Medical Rec #:  [id number]        Height:       73.0 in Accession #:    [id number]       Weight:       196.9 lb Date of Birth:  08-Sep-1950        BSA:          2.137 m Patient Age:    46 years         BP:           143/67 mmHg Patient Gender: M                HR:           70 bpm. Exam Location:  Inpatient Procedure: Transesophageal Echo, Color Doppler and Cardiac Doppler Indications:     Bacteremia  History:         Patient has prior history of Echocardiogram examinations, most  recent  08/16/2021. CHF, COPD, Arrythmias:Atrial Fibrillation;                  Risk Factors:Hypertension and Dyslipidemia. 03/23/17 Bentall                  procedure with 38mm Edwards Magna Ease Aortic Valve                  Bioprosthetic and 84mm Gelweave at Sinuses of Valsalva.  Sonographer:     Raquel Sarna Senior RDCS Referring Phys:  [place] Diagnosing Phys: Franki Monte PROCEDURE: After discussion of the risks and benefits of a TEE, an informed consent was obtained from the patient. The transesophogeal probe was passed without difficulty through the esophogus of the patient. Sedation performed by different physician. The patient was monitored while under deep sedation. Anesthestetic sedation was provided intravenously by Anesthesiology: 146mg  of Propofol. The patient developed no complications during the procedure. IMPRESSIONS  1. Left ventricular ejection fraction, by estimation, is 55 to 60%. The left ventricle has normal function. The left ventricle has no regional wall motion abnormalities.  2. Right ventricular systolic function is mildly reduced. The right ventricular size is mildly enlarged.  3. Left atrial size was mildly dilated. No left atrial/left atrial appendage thrombus was detected.  4. Right atrial size was mildly dilated.  5. The mitral valve is abnormal. Moderate posteriorly-directed mitral valve regurgitation. No flow reversal in the pulmonary vein systolic doppler pattern. No evidence of mitral stenosis. No mitral valve vegetation.  6. No tricuspid valve vegetation. Peak RV-RA gradient 17 mmHg. The tricuspid valve is abnormal. Tricuspid valve regurgitation is moderate.  7. 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 The valve opens normally. Trivial aortic insufficiency.  8. No PFO or ASD by color doppler.  9. No pulmonary valve vegetation. 10. Stable repaired ascending aorta. Aortic root/ascending aorta has been repaired/replaced. FINDINGS  Left Ventricle: Left  ventricular ejection fraction, by estimation, is 55 to 60%. The left ventricle has normal function. The left ventricle has no regional wall motion abnormalities. The left ventricular internal cavity size was normal in size. There is  no left ventricular hypertrophy. Right Ventricle: The right ventricular size is mildly enlarged. No increase in right ventricular wall thickness. Right ventricular systolic function is mildly reduced. Left Atrium: Left atrial size was mildly dilated. No left atrial/left atrial appendage thrombus was detected. Right Atrium: Right atrial size was mildly dilated. Pericardium: There is no evidence of pericardial effusion. Mitral Valve: The mitral valve is abnormal. Moderate mitral valve regurgitation. No evidence of mitral valve stenosis. Tricuspid Valve: No tricuspid valve vegetation. Peak RV-RA gradient 17 mmHg. The tricuspid valve is abnormal. Tricuspid valve regurgitation is moderate. Aortic Valve: There is a bioprosthetic aortic valve with no vegetation. The valve opens normally. Trivial aortic insufficiency. The aortic valve has been repaired/replaced. Aortic valve regurgitation is trivial. Pulmonic Valve: No pulmonary valve vegetation. The pulmonic valve was normal in structure. Pulmonic valve regurgitation is not visualized. 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 IAS/Shunts: No PFO or ASD by color doppler. Dalton McleanMD Electronically signed by Franki Monte Signature Date/Time: 08/21/2021/3:57:59 PM    Final       PFT Results Latest Ref Rng & Units 01/25/2018 12/25/2013  FVC-Pre L 2.81 4.86  FVC-Predicted Pre % 60 101  FVC-Post L 2.92 4.96  FVC-Predicted Post % 62 103  Pre FEV1/FVC % % 66 70  Post FEV1/FCV % % 68  71  FEV1-Pre L 1.87 3.38  FEV1-Predicted Pre % 54 93  FEV1-Post L 1.98 3.51  DLCO uncorrected ml/min/mmHg 10.89 24.44  DLCO UNC% % 32 72  DLVA Predicted % 52 77  TLC L 4.83 8.27  TLC %  Predicted % 66 114  RV % Predicted % 86 142    No results found for: NITRICOXIDE      Assessment & Plan:   Severe sepsis with septic shock ([place]) Recent hospitalization with severe sepsis and septic shock due to Enterococcus bacteremia-patient is clinically improved after antibiotics.  Transesophageal echo showed no valvular vegetation.  CT abdomen and pelvis without identifiable cause. Patient remains very weak but is slowly improving.  No further antibiotics at this time.  Patient to follow-up with infectious disease next week as planned  Plan  Patient Instructions  Continue on BREZTRI 2 puffs Twice daily, rinse after use.  Duoneb Four times a day  As needed   Activity as tolerated, begin PT/OT as planned this week.  Continue on Oxygen 5l/m at rest and 8 l/m with activity , goal is O2 sats >88%.  High protein diet   Boost between meals Twice daily   Follow up with with Dr. Vaughan Browner in 3-4 weeks and As needed   Please contact office for sooner follow up if symptoms do not improve or worsen or seek emergency care         COPD (chronic obstructive pulmonary disease) ([place]) Recent decompensation with bacteremia.  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  Has been having increased oxygen demands since hospitalization. Patient is continue on triple therapy.  May use nebulizers up to 4 times a day as needed. He is severely deconditioned.  Agree with ongoing physical therapy and occupational therapy. Long discussion regarding code status, EOL discussion regarding living will and HCPOA.   Plan  Patient Instructions  Continue on BREZTRI 2 puffs Twice daily, rinse after use.  Duoneb Four times a day  As needed   Activity as tolerated, begin PT/OT as planned this week.  Continue on Oxygen 5l/m at rest and 8 l/m with activity , goal is O2 sats >88%.  High protein diet   Boost between meals Twice daily   Follow up with with Dr. Vaughan Browner in 3-4 weeks and As needed    Please contact office for sooner follow up if symptoms do not improve or worsen or seek emergency care         Enterococcal bacteremia Clinically improved.  Patient is completed a full course of antibiotics.  Follow with ID as planned  Acute on chronic respiratory failure with hypoxia (HCC) Recent decompensation with increased oxygen demands.  Patient is now requiring 5 L at rest and up to 8 L with activity.  Prior to admission was on 3 to 4 L at home.  Patient is continue on current settings to keep O2 saturations greater than 88%.  Patient to follow-up in 3 to 4 weeks and as needed  Plan Patient Instructions  Continue on BREZTRI 2 puffs Twice daily, rinse after use.  Duoneb Four times a day  As needed   Activity as tolerated, begin PT/OT as planned this week.  Continue on Oxygen 5l/m at rest and 8 l/m with activity , goal is O2 sats >88%.  High protein diet   Boost between meals Twice daily   Follow up with with Dr. Vaughan Browner in 3-4 weeks and As needed   Please contact office for sooner follow  up if symptoms do not improve or worsen or seek emergency care         Protein-calorie malnutrition, severe ([place]) Recommend high-protein diet.  I spent   45 minutes dedicated to the care of this patient on the date of this encounter to include pre-visit review of records, face-to-face time with the patient discussing conditions above, post visit ordering of testing, clinical documentation with the electronic health record, making appropriate referrals as documented, and communicating necessary findings to members of the patients care team.    Rexene Edison, NP 09/03/2021

## 2021-09-03 NOTE — Assessment & Plan Note (Signed)
Clinically improved.  Patient is completed a full course of antibiotics.  Follow with ID as planned

## 2021-09-04 ENCOUNTER — Telehealth: Payer: Self-pay | Admitting: Adult Health

## 2021-09-04 NOTE — Telephone Encounter (Signed)
ATC Sean Riley.  Call went to recording and call was placed on hold.  Will try again 09/05/21.

## 2021-09-05 ENCOUNTER — Telehealth: Payer: Self-pay

## 2021-09-05 NOTE — Telephone Encounter (Signed)
Plan of care signed and faxed back to [facility] at [phone]. Form sent for scanning.

## 2021-09-08 ENCOUNTER — Telehealth: Payer: Self-pay | Admitting: Adult Health

## 2021-09-08 MED ORDER — IPRATROPIUM-ALBUTEROL 0.5-2.5 (3) MG/3ML IN SOLN: 3.0000 mL | RESPIRATORY_TRACT | 11 refills | Status: DC | PRN

## 2021-09-08 NOTE — Telephone Encounter (Signed)
I sent electronic refills on the Duoneb to [employer]. Closing encounter.

## 2021-09-08 NOTE — Telephone Encounter (Signed)
Will be on the lookout for the fax from [place].

## 2021-09-09 ENCOUNTER — Other Ambulatory Visit: Payer: Self-pay

## 2021-09-09 ENCOUNTER — Encounter: Payer: Self-pay | Admitting: Infectious Diseases

## 2021-09-09 ENCOUNTER — Ambulatory Visit (INDEPENDENT_AMBULATORY_CARE_PROVIDER_SITE_OTHER): Payer: Medicare Other | Admitting: Infectious Diseases

## 2021-09-09 VITALS — BP 110/68 | HR 90 | Temp 97.6°F

## 2021-09-09 DIAGNOSIS — R7881 Bacteremia: Secondary | ICD-10-CM | POA: Diagnosis not present

## 2021-09-09 DIAGNOSIS — Z5181 Encounter for therapeutic drug level monitoring: Secondary | ICD-10-CM | POA: Diagnosis not present

## 2021-09-09 DIAGNOSIS — Z9889 Other specified postprocedural states: Secondary | ICD-10-CM

## 2021-09-09 DIAGNOSIS — Z953 Presence of xenogenic heart valve: Secondary | ICD-10-CM

## 2021-09-09 DIAGNOSIS — B952 Enterococcus as the cause of diseases classified elsewhere: Secondary | ICD-10-CM | POA: Diagnosis not present

## 2021-09-09 NOTE — Progress Notes (Addendum)
Patient Active Problem List   Diagnosis Date Noted   Protein-calorie malnutrition, severe ([place]) 09/03/2021   Acute on chronic diastolic CHF (congestive heart failure) ([place])    Pressure injury of skin 16/05/9603   Acute metabolic encephalopathy 54/04/8118   Prosthetic valve endocarditis ([place])    Enterococcal bacteremia 08/16/2021   Severe sepsis with septic shock ([place]) 08/15/2021   AKI (acute kidney injury) ([place])    Cellulitis of left ankle 06/23/2021   UTI due to extended-spectrum beta lactamase (ESBL) producing Escherichia coli 01/27/2021   Chronic diastolic CHF (congestive heart failure) ([place]) 01/27/2021   Acute GI bleeding 01/27/2021   Normocytic anemia 01/24/2021   Acute on chronic respiratory failure with hypoxia ([place]) 01/24/2021   GI bleed 01/24/2021   Chest pain 01/24/2021   Leukocytosis 01/24/2021   CKD (chronic kidney disease) 01/24/2021   Acute respiratory failure with hypoxia ([place]) 09/06/2020   Secondary hypercoagulable state ([place]) 06/26/2020   Hormonal disorder 10/16/2019   Insomnia 12/29/2018   Chronic anemia 12/29/2018   Atrial tachycardia ([place])    Thoracic aortic aneurysm without rupture 03/30/2018   COPD (chronic obstructive pulmonary disease) ([place]) 10/06/2017   Psoriasis 09/14/2017   Depression 04/18/2017   S/P aortic dissection repair 03/24/2017   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   Elevated PSA 01/11/2016   PCP NOTES >>>>>>> 07/24/2015   Gynecomastia, male 10/10/2014   Allergic rhinitis 09/10/2014   Dermatitis 05/11/2014   Annual physical exam 05/10/2014   Erectile dysfunction 05/10/2014   HTN (hypertension) 10/28/2013   Bronchospasm 10/28/2013   ETOH abuse 10/28/2013   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   ALPRAZOLAM (XANAX) 1 MG TABLET    Take 1 mg by mouth at bedtime.   APIXABAN (ELIQUIS) 5 MG TABS TABLET    Take 1 tablet (5 mg total) by mouth 2 (two) times daily.   BISACODYL (DULCOLAX) 5 MG EC TABLET    Take 5 mg by mouth daily as needed for moderate constipation.   BUDESON-GLYCOPYRROL-FORMOTEROL (BREZTRI AEROSPHERE) 160-9-4.8 MCG/ACT AERO    Inhale 2 puffs into the lungs in the morning and at bedtime.   ESCITALOPRAM (LEXAPRO) 10 MG TABLET    TAKE 1 TABLET DAILY   ESOMEPRAZOLE (NEXIUM) 40 MG CAPSULE    Take 1 capsule (40 mg total) by mouth daily before breakfast.   FLECAINIDE (TAMBOCOR) 50 MG TABLET    Take 1.5 tablets (75 mg total) by mouth 2 (two) times daily.   FLUTICASONE (FLONASE) 50 MCG/ACT NASAL SPRAY    Place 1 spray into both nostrils 2 (two) times daily as needed for allergies. Use one spray in each nostril twice daily.   HYDROXYPROPYL METHYLCELLULOSE / HYPROMELLOSE (ISOPTO TEARS / GONIOVISC) 2.5 % OPHTHALMIC SOLUTION    Place 1-2 drops into both eyes 3 (three) times daily as needed (dry/irritated eyes).   IPRATROPIUM-ALBUTEROL (DUONEB) 0.5-2.5 (3) MG/3ML SOLN    Take 3 mLs by nebulization every 4 (  four) hours as needed.   JARDIANCE 10 MG TABS TABLET    TAKE 1 TABLET DAILY BEFORE BREAKFAST   MAGNESIUM HYDROXIDE (MILK OF MAGNESIA) 400 MG/5ML SUSPENSION    Take 7.5 mLs by mouth daily as needed (stomach cramps/constipation).   METOPROLOL TARTRATE (LOPRESSOR) 25 MG TABLET    Take 0.5 tablets (12.5 mg total) by mouth 2 (two) times daily. Please make yearly appt with Dr. Caryl Comes for January 2022 for future refills. Thank you 1st attempt   OXYGEN    Inhale 5 L/min into the lungs continuous.    POTASSIUM CHLORIDE SA (KLOR-CON M) 20 MEQ TABLET    Take 40 mEq by mouth at bedtime.   ROSUVASTATIN (CRESTOR) 5 MG TABLET    Take 5 mg by mouth at bedtime.   TORSEMIDE (DEMADEX) 20 MG TABLET    Take 3 tablets (60 mg total) by mouth daily.   TRIAMCINOLONE CREAM (KENALOG) 0.5 %    Apply 1 application topically 3 (three) times daily.  Modified Medications   No medications on file  Discontinued Medications   No medications on file    Subjective Here for HFU in the setting of E faecalis bacteremia of unclear cause and presence of prosthetic AV and endovascular stent graft repair. Accompanied by wife. Patient is in a wheel chair. Wife says he is on 5l oxygen at baseline and needs up to 8l when moving around. Feels tired and has poor appetite. Denies fevers, chills, nausea, vomiting, diarrhea and GU symptoms. Feels congested and has cough with clear phlegm. Completed course of linezolid as instructed on 1/20. Denies missing doses. Following Cardiology and Pulmonary closely.   Review of Systems: ROS all systems reviewed and negative except as stated above   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 LUTS    Hypertension    Microscopic hematuria    negative cystoscopy   NONSPECIFIC ABNORMAL ELECTROCARDIOGRAM    Pleural effusion on right    Pleural effusion, bilateral    S/P aortic dissection repair 03/24/2017   Biological Bentall aortic root replacement + resection and grafting of entire ascending aorta, transverse aortic arch and proximal descending thoracic aorta with elephant trunk distal anastomosis and debranching of aortic arch vessels   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   25 mm [facility] Ease bovine pericardial tissue valve and 28 mm Gelweave Valsalva aortic root graft with reimplantation of left main and right coronary arteries   Varicose veins    Past Surgical History:  Procedure Laterality Date   APPENDECTOMY     CARDIAC CATHETERIZATION      X3;Dr Caryl Comes, last 01/20/12: mild non-obstructive CAD, normal LV systolic function   CARDIOVERSION N/A 05/31/2018   Procedure: CARDIOVERSION;  Surgeon: Sueanne Margarita, MD;  Location: [facility] ENDOSCOPY;  Service: Cardiovascular;  Laterality: N/A;   CARDIOVERSION N/A 06/13/2020   Procedure: CARDIOVERSION;  Surgeon: Larey Dresser, MD;  Location: [facility] ENDOSCOPY;  Service: Cardiovascular;  Laterality: N/A;   COLONOSCOPY     COLONOSCOPY N/A 01/27/2021   Procedure: COLONOSCOPY;  Surgeon: Gatha Mayer, MD;  Location[facility] ENDOSCOPY;  Service: Endoscopy;  Laterality: N/A;   CYSTOSCOPY  1978   Dr Hartley Barefoot   ESOPHAGOGASTRODUODENOSCOPY N/A 01/27/2021   Procedure: ESOPHAGOGASTRODUODENOSCOPY (EGD);  Surgeon: Gatha Mayer, MD;  Location: [facility] ENDOSCOPY;  Service: Endoscopy;  Laterality: N/A;   EYE SURGERY     GIVENS CAPSULE STUDY N/A 01/27/2021   Procedure: GIVENS CAPSULE STUDY;  Surgeon: Gatha Mayer, MD;  Location: [place];  Service: Endoscopy;  Laterality: N/A;   IR THORACENTESIS ASP PLEURAL SPACE W/IMG GUIDE  11/25/2017   KNEE ARTHROSCOPY  2012   Dr Theda Sers   LUMBAR LAMINECTOMY  1987   Dr Lestine Box  2007   PERICARDIOCENTESIS N/A 04/07/2017   Procedure: PERICARDIOCENTESIS;  Surgeon: Sherren Mocha, MD;  Location: [facility] CV LAB;  Service: Cardiovascular;  Laterality: N/A;   POLYPECTOMY     POLYPECTOMY  01/27/2021   Procedure: POLYPECTOMY;  Surgeon: Gatha Mayer, MD;  Location: [facility] ENDOSCOPY;  Service: Endoscopy;;   PRESSURE SENSOR/CARDIOMEMS N/A 12/26/2020   Procedure: PRESSURE SENSOR/CARDIOMEMS;  Surgeon: Larey Dresser, MD;  Location: [place] CV LAB;  Service: Cardiovascular;  Laterality: N/A;   REPAIR OF  ACUTE ASCENDING THORACIC AORTIC DISSECTION N/A 03/23/2017   Procedure: REPAIR OF ACUTE ASCENDING THORACIC AORTIC DISSECTION.  Bentall procedure.  Aortic root repleacement with bioprosthetic valve.  Reimplantation of left and right coronary arteries.  Total resection of transverse aortic arch.  Elephant trunk distal anastomosis and debranching of arch vessels.;  Surgeon: Rexene Alberts, MD;  Location: [place];  Service: Vascular;  Laterality: N/A;   RIGHT HEART CATH N/A 06/15/2019   Procedure: RIGHT HEART CATH;  Surgeon: Larey Dresser, MD;  Location: [place] CV LAB;  Service: Cardiovascular;  Laterality: N/A;   ROTATOR CUFF REPAIR     SHOULDER ARTHROSCOPY  2011   Dr Theda Sers   SHOULDER ARTHROSCOPY Right 08/2015   SPINAL FUSION  1986   Dr Rolin Barry   TEE WITHOUT CARDIOVERSION N/A 03/23/2017   Procedure: TRANSESOPHAGEAL ECHOCARDIOGRAM (TEE);  Surgeon: Rexene Alberts, MD;  Location: [place];  Service: Open Heart Surgery;  Laterality: N/A;   TEE WITHOUT CARDIOVERSION N/A 08/21/2021   Procedure: TRANSESOPHAGEAL ECHOCARDIOGRAM (TEE);  Surgeon: Larey Dresser, MD;  Location: [facility] ENDOSCOPY;  Service: Cardiovascular;  Laterality: N/A;   THORACIC AORTIC ENDOVASCULAR STENT GRAFT N/A 03/30/2018   Procedure: THORACIC AORTIC ENDOVASCULAR STENT GRAFT WITH INTRAVASCULAR ULTRASOUND;  Surgeon: Serafina Mitchell, MD;  Location: [facility] OR;  Service: Vascular;  Laterality: N/A;   TRACHEOSTOMY     age 59 for croup     Social History   Tobacco Use   Smoking status: Former    Packs/day: 2.00    Years: 45.00    Pack years: 90.00    Types: Cigarettes    Quit date: 03/22/2017    Years since quitting: 4.4   Smokeless tobacco: Never   Tobacco comments:    2 ppd , quit 03/2017  Vaping Use   Vaping Use: Never used  Substance Use Topics   Alcohol use: Not Currently   Drug use: No    Family History  Problem Relation Age of Onset   Hypertension Mother    Hypertension Father    Benign prostatic hyperplasia Father         S/P TURP   Heart attack Maternal Grandmother        MI in 6s   Breast cancer Maternal Grandmother    Arrhythmia Brother         X 2   Heart attack Maternal Aunt  MI in 15s   Stroke Neg Hx    Diabetes Neg Hx    Colon cancer Neg Hx    Colon polyps Neg Hx    Stomach cancer Neg Hx    Rectal cancer Neg Hx    Esophageal cancer Neg Hx    Prostate cancer Neg Hx    Other Neg Hx        gynecomastia    Allergies  Allergen Reactions   Simvastatin Other (See Comments)    Mental status changes   Celecoxib Other (See Comments)    GI UPSET AND INFLAMMATION   Codeine Other (See Comments)    GI UPSET AND INFLAMMATION   Nsaids Other (See Comments)    GI UPSET AND INFLAMMATION (can tolerate via IV)   Tape Other (See Comments)    Medical tape and Band-Aids PULL OFF THE SKIN; please use Coban wrap   Cephalexin Itching and Rash   Latex Rash    Health Maintenance  Topic Date Due   Zoster Vaccines- Shingrix (1 of 2) Never done   TETANUS/TDAP  02/18/2018   Pneumonia Vaccine 19+ Years old (3 - PPSV23 if available, else PCV20) 10/17/2018   INFLUENZA VACCINE  Completed   COVID-19 Vaccine  Completed   Hepatitis C Screening  Completed   HPV VACCINES  Aged Out   COLONOSCOPY (Pts 45-71yrs Insurance coverage will need to be confirmed)  Discontinued    Objective:  Vitals:   09/09/21 1056  BP: 110/68  Pulse: 90  Temp: 97.6 F (36.4 C)  TempSrc: Oral  SpO2: 91%   There is no height or weight on file to calculate BMI.  Physical Exam Constitutional:  chronically ill appearing, in a wheelchair     Appearance: on 5L [place]  HENT:     Head: Normocephalic and atraumatic.      Mouth: Mucous membranes are moist.  Eyes:    Conjunctiva/sclera: Conjunctivae normal.     Pupils: Pupils are equal, round  Cardiovascular:     Rate and Rhythm: Normal rate and regular rhythm.     Heart sounds:   Pulmonary:     Effort: on 5 L [place]    Breath sounds: decreased bilateral air entry    Abdominal:     General: Non distended     Palpations: soft.   Musculoskeletal:        General: Normal range of motion. No pedal edema   Skin:    General: Skin is warm and dry.     Comments:  Neurological:     General: grossly non focal     Mental Status: awake, alert and oriented to person, place, and time.   Psychiatric:        Mood and Affect: Mood normal.   Lab Results Lab Results  Component Value Date   WBC 10.7 08/29/2021   HGB 12.7 (L) 08/29/2021   HCT 40.0 08/29/2021   MCV 83.0 08/29/2021   PLT 263 08/29/2021    Lab Results  Component Value Date   CREATININE 1.68 (H) 08/29/2021   BUN 30 (H) 08/29/2021   NA 136 08/29/2021   K 4.6 08/29/2021   CL 97 (L) 08/29/2021   CO2 28 08/29/2021    Lab Results  Component Value Date   ALT 13 08/29/2021   AST 19 08/29/2021   ALKPHOS 119 08/15/2021   BILITOT 1.0 08/29/2021    Lab Results  Component Value Date   CHOL 156 11/15/2020   HDL 60 11/15/2020  [place] 84 11/15/2020   LDLDIRECT 137.9 04/24/2013   TRIG 62 11/15/2020   CHOLHDL 2.6 11/15/2020   No results found for: LABRPR, RPRTITER No results found for: HIV1RNAQUANT, HIV1RNAVL, CD4TABS  Problem List Items Addressed This Visit       Other   S/P aortic dissection repair   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   Enterococcal bacteremia - Primary   Relevant Orders   Blood culture (routine single)   Blood culture (routine single)   Medication monitoring encounter   39 Y O male with PMH of ascending aortic aneurysm 2/2 dissection in Aug 2018 s/p complete aortic arch replacement with elephant trunk as well as bioprosthetic AV complicated by progressive enlargement of the descending thoracic aorta with persistent dissection s/p endovascular stent graft repair 03/2018, COPD on home oxygen, Afib on AC, HTN, GIB, GERD, Psoriasis here for HFU for   High-grade E faecalis bacteremia - h/o ascending aortic aneurysm 2/2 dissection in Aug  2018 s/p complete aortic arch replacement with elephant trunk as well as bioprosthetic AV complicated by progressive enlargement of the descending thoracic aorta with persistent dissection s/p endovascular stent graft repair 03/2018 - Unclear cause, possibly thought to be left ankle cellulitis, TTE and TEE negative for endocarditis. CT abdomen/pelvis - unremarkable  - Completed 14 days course of antibiotics from negative blood cultures ( IV ampicillin followed by PO linezolid) - Plan for surveillance cultures today given presence of Prosthetic AV and endovascular stent graft  - Fu pending blood culture results - Follow up with Vascular for dissected aorta  2. Medication monitoring  - CBC 1/12/3 stable , Cr trending up   3. Chronic respiratory failure on oxygen - in the setting of COPD and Chronic diastolic CHF - follows Pulmonary and cardiology   I have personally spent 43 minutes involved in face-to-face and non-face-to-face activities for this patient on the day of the visit including staff time, coordination of care and counseling of the patient.   Wilber Oliphant, [facility] 09/09/2021, 11:09 AM

## 2021-09-09 NOTE — Telephone Encounter (Signed)
Routing to [employer] and [facility] to see if the fax was received.

## 2021-09-10 ENCOUNTER — Telehealth: Payer: Self-pay | Admitting: Adult Health

## 2021-09-10 ENCOUNTER — Ambulatory Visit: Payer: Medicare Other | Admitting: Pulmonary Disease

## 2021-09-10 NOTE — Telephone Encounter (Signed)
Spoke with the pt's spouse  She is concerned pt has bronchitis  He is having increased cough, chest congestion, wheezing, SOB  OV with Dr Vaughan Browner today at 3 pm for eval  Seek emergent care sooner if neede

## 2021-09-11 NOTE — Telephone Encounter (Signed)
Message sent to [facility]), CMN signed by Rexene Edison NP for nebulizer medication.  Nothing further needed.

## 2021-09-11 NOTE — Telephone Encounter (Signed)
Did this get done

## 2021-09-11 NOTE — Telephone Encounter (Signed)
Called and spoke with Sean Riley with [facility], verified that they received the fax needed.  Nothing further needed.

## 2021-09-12 DIAGNOSIS — N179 Acute kidney failure, unspecified: Secondary | ICD-10-CM | POA: Diagnosis not present

## 2021-09-12 DIAGNOSIS — J9621 Acute and chronic respiratory failure with hypoxia: Secondary | ICD-10-CM | POA: Diagnosis not present

## 2021-09-12 DIAGNOSIS — J440 Chronic obstructive pulmonary disease with acute lower respiratory infection: Secondary | ICD-10-CM | POA: Diagnosis not present

## 2021-09-12 DIAGNOSIS — J181 Lobar pneumonia, unspecified organism: Secondary | ICD-10-CM | POA: Diagnosis not present

## 2021-09-12 DIAGNOSIS — R6521 Severe sepsis with septic shock: Secondary | ICD-10-CM | POA: Diagnosis not present

## 2021-09-12 DIAGNOSIS — A4181 Sepsis due to Enterococcus: Secondary | ICD-10-CM | POA: Diagnosis not present

## 2021-09-13 DIAGNOSIS — R6521 Severe sepsis with septic shock: Secondary | ICD-10-CM | POA: Diagnosis not present

## 2021-09-13 DIAGNOSIS — J9621 Acute and chronic respiratory failure with hypoxia: Secondary | ICD-10-CM | POA: Diagnosis not present

## 2021-09-13 DIAGNOSIS — J440 Chronic obstructive pulmonary disease with acute lower respiratory infection: Secondary | ICD-10-CM | POA: Diagnosis not present

## 2021-09-13 DIAGNOSIS — A4181 Sepsis due to Enterococcus: Secondary | ICD-10-CM | POA: Diagnosis not present

## 2021-09-13 DIAGNOSIS — N179 Acute kidney failure, unspecified: Secondary | ICD-10-CM | POA: Diagnosis not present

## 2021-09-13 DIAGNOSIS — J181 Lobar pneumonia, unspecified organism: Secondary | ICD-10-CM | POA: Diagnosis not present

## 2021-09-15 LAB — CULTURE, BLOOD (SINGLE)
MICRO NUMBER:: 12944705
MICRO NUMBER:: 12944706
Result:: NO GROWTH
Result:: NO GROWTH
SPECIMEN QUALITY:: ADEQUATE
SPECIMEN QUALITY:: ADEQUATE

## 2021-09-16 ENCOUNTER — Ambulatory Visit: Payer: Medicare Other | Admitting: Internal Medicine

## 2021-09-16 ENCOUNTER — Other Ambulatory Visit: Payer: Self-pay | Admitting: Internal Medicine

## 2021-09-17 ENCOUNTER — Other Ambulatory Visit: Payer: Self-pay | Admitting: Internal Medicine

## 2021-09-19 DIAGNOSIS — J9621 Acute and chronic respiratory failure with hypoxia: Secondary | ICD-10-CM | POA: Diagnosis not present

## 2021-09-19 DIAGNOSIS — A4181 Sepsis due to Enterococcus: Secondary | ICD-10-CM | POA: Diagnosis not present

## 2021-09-19 DIAGNOSIS — J440 Chronic obstructive pulmonary disease with acute lower respiratory infection: Secondary | ICD-10-CM | POA: Diagnosis not present

## 2021-09-19 DIAGNOSIS — N179 Acute kidney failure, unspecified: Secondary | ICD-10-CM | POA: Diagnosis not present

## 2021-09-19 DIAGNOSIS — R6521 Severe sepsis with septic shock: Secondary | ICD-10-CM | POA: Diagnosis not present

## 2021-09-19 DIAGNOSIS — J181 Lobar pneumonia, unspecified organism: Secondary | ICD-10-CM | POA: Diagnosis not present

## 2021-09-22 ENCOUNTER — Telehealth (HOSPITAL_COMMUNITY): Payer: Self-pay | Admitting: Adult Health

## 2021-09-22 NOTE — Telephone Encounter (Signed)
° °  Cardiomems Goal 16  Todays Cardiomem reading is  20  Drinking more than he is eating. Discussed low salt food choices and limiting fluid intake to < 2 liters per day.   Instructed to take an extra 20 mg torsemide for the next 2 days days.  Mrs Capili will help him with the med changes.   Continue to monitor Cardiomems reading.   Hillery Bhalla NP-C  10:23 AM

## 2021-09-26 ENCOUNTER — Encounter (HOSPITAL_COMMUNITY): Payer: Self-pay

## 2021-09-26 ENCOUNTER — Emergency Department (HOSPITAL_COMMUNITY): Payer: Medicare Other

## 2021-09-26 ENCOUNTER — Inpatient Hospital Stay (HOSPITAL_COMMUNITY)
Admission: EM | Admit: 2021-09-26 | Discharge: 2021-10-03 | DRG: 291 | Disposition: A | Discharge status: Home Health | Payer: Medicare Other | Attending: Internal Medicine | Admitting: Internal Medicine

## 2021-09-26 DIAGNOSIS — K219 Gastro-esophageal reflux disease without esophagitis: Secondary | ICD-10-CM | POA: Diagnosis present

## 2021-09-26 DIAGNOSIS — Z79899 Other long term (current) drug therapy: Secondary | ICD-10-CM

## 2021-09-26 DIAGNOSIS — J439 Emphysema, unspecified: Secondary | ICD-10-CM | POA: Diagnosis not present

## 2021-09-26 DIAGNOSIS — J449 Chronic obstructive pulmonary disease, unspecified: Secondary | ICD-10-CM | POA: Diagnosis present

## 2021-09-26 DIAGNOSIS — E871 Hypo-osmolality and hyponatremia: Secondary | ICD-10-CM | POA: Diagnosis not present

## 2021-09-26 DIAGNOSIS — R54 Age-related physical debility: Secondary | ICD-10-CM | POA: Diagnosis not present

## 2021-09-26 DIAGNOSIS — I480 Paroxysmal atrial fibrillation: Secondary | ICD-10-CM | POA: Diagnosis not present

## 2021-09-26 DIAGNOSIS — N179 Acute kidney failure, unspecified: Secondary | ICD-10-CM | POA: Diagnosis present

## 2021-09-26 DIAGNOSIS — J849 Interstitial pulmonary disease, unspecified: Secondary | ICD-10-CM | POA: Diagnosis not present

## 2021-09-26 DIAGNOSIS — J9622 Acute and chronic respiratory failure with hypercapnia: Secondary | ICD-10-CM | POA: Diagnosis not present

## 2021-09-26 DIAGNOSIS — J918 Pleural effusion in other conditions classified elsewhere: Secondary | ICD-10-CM | POA: Diagnosis present

## 2021-09-26 DIAGNOSIS — R079 Chest pain, unspecified: Secondary | ICD-10-CM

## 2021-09-26 DIAGNOSIS — R0689 Other abnormalities of breathing: Secondary | ICD-10-CM | POA: Diagnosis not present

## 2021-09-26 DIAGNOSIS — Z9981 Dependence on supplemental oxygen: Secondary | ICD-10-CM

## 2021-09-26 DIAGNOSIS — Z953 Presence of xenogenic heart valve: Secondary | ICD-10-CM | POA: Diagnosis not present

## 2021-09-26 DIAGNOSIS — F419 Anxiety disorder, unspecified: Secondary | ICD-10-CM | POA: Diagnosis present

## 2021-09-26 DIAGNOSIS — J9621 Acute and chronic respiratory failure with hypoxia: Secondary | ICD-10-CM | POA: Diagnosis present

## 2021-09-26 DIAGNOSIS — Z20822 Contact with and (suspected) exposure to covid-19: Secondary | ICD-10-CM | POA: Diagnosis not present

## 2021-09-26 DIAGNOSIS — R0902 Hypoxemia: Secondary | ICD-10-CM | POA: Diagnosis not present

## 2021-09-26 DIAGNOSIS — I130 Hypertensive heart and chronic kidney disease with heart failure and stage 1 through stage 4 chronic kidney disease, or unspecified chronic kidney disease: Principal | ICD-10-CM | POA: Diagnosis present

## 2021-09-26 DIAGNOSIS — I5033 Acute on chronic diastolic (congestive) heart failure: Secondary | ICD-10-CM | POA: Diagnosis not present

## 2021-09-26 DIAGNOSIS — I270 Primary pulmonary hypertension: Secondary | ICD-10-CM | POA: Diagnosis not present

## 2021-09-26 DIAGNOSIS — M1990 Unspecified osteoarthritis, unspecified site: Secondary | ICD-10-CM | POA: Diagnosis present

## 2021-09-26 DIAGNOSIS — R6889 Other general symptoms and signs: Secondary | ICD-10-CM | POA: Diagnosis not present

## 2021-09-26 DIAGNOSIS — Z7189 Other specified counseling: Secondary | ICD-10-CM | POA: Diagnosis not present

## 2021-09-26 DIAGNOSIS — J811 Chronic pulmonary edema: Secondary | ICD-10-CM | POA: Diagnosis not present

## 2021-09-26 DIAGNOSIS — J939 Pneumothorax, unspecified: Secondary | ICD-10-CM

## 2021-09-26 DIAGNOSIS — I7123 Aneurysm of the descending thoracic aorta, without rupture: Secondary | ICD-10-CM | POA: Diagnosis present

## 2021-09-26 DIAGNOSIS — R0602 Shortness of breath: Secondary | ICD-10-CM | POA: Diagnosis not present

## 2021-09-26 DIAGNOSIS — I517 Cardiomegaly: Secondary | ICD-10-CM | POA: Diagnosis not present

## 2021-09-26 DIAGNOSIS — N1831 Chronic kidney disease, stage 3a: Secondary | ICD-10-CM | POA: Diagnosis not present

## 2021-09-26 DIAGNOSIS — Z7984 Long term (current) use of oral hypoglycemic drugs: Secondary | ICD-10-CM

## 2021-09-26 DIAGNOSIS — Z885 Allergy status to narcotic agent status: Secondary | ICD-10-CM

## 2021-09-26 DIAGNOSIS — E785 Hyperlipidemia, unspecified: Secondary | ICD-10-CM | POA: Diagnosis present

## 2021-09-26 DIAGNOSIS — F32A Depression, unspecified: Secondary | ICD-10-CM | POA: Diagnosis not present

## 2021-09-26 DIAGNOSIS — Z8249 Family history of ischemic heart disease and other diseases of the circulatory system: Secondary | ICD-10-CM

## 2021-09-26 DIAGNOSIS — L98429 Non-pressure chronic ulcer of back with unspecified severity: Secondary | ICD-10-CM | POA: Diagnosis not present

## 2021-09-26 DIAGNOSIS — Z743 Need for continuous supervision: Secondary | ICD-10-CM | POA: Diagnosis not present

## 2021-09-26 DIAGNOSIS — I700 Atherosclerosis of aorta: Secondary | ICD-10-CM | POA: Diagnosis not present

## 2021-09-26 DIAGNOSIS — Z981 Arthrodesis status: Secondary | ICD-10-CM

## 2021-09-26 DIAGNOSIS — I4891 Unspecified atrial fibrillation: Secondary | ICD-10-CM | POA: Diagnosis present

## 2021-09-26 DIAGNOSIS — J90 Pleural effusion, not elsewhere classified: Secondary | ICD-10-CM | POA: Diagnosis not present

## 2021-09-26 DIAGNOSIS — J9811 Atelectasis: Secondary | ICD-10-CM | POA: Diagnosis present

## 2021-09-26 DIAGNOSIS — Z881 Allergy status to other antibiotic agents status: Secondary | ICD-10-CM

## 2021-09-26 DIAGNOSIS — Z888 Allergy status to other drugs, medicaments and biological substances status: Secondary | ICD-10-CM

## 2021-09-26 DIAGNOSIS — I499 Cardiac arrhythmia, unspecified: Secondary | ICD-10-CM | POA: Diagnosis not present

## 2021-09-26 DIAGNOSIS — G4733 Obstructive sleep apnea (adult) (pediatric): Secondary | ICD-10-CM | POA: Diagnosis present

## 2021-09-26 DIAGNOSIS — Z87891 Personal history of nicotine dependence: Secondary | ICD-10-CM

## 2021-09-26 DIAGNOSIS — R0603 Acute respiratory distress: Secondary | ICD-10-CM

## 2021-09-26 DIAGNOSIS — E440 Moderate protein-calorie malnutrition: Secondary | ICD-10-CM | POA: Insufficient documentation

## 2021-09-26 DIAGNOSIS — J948 Other specified pleural conditions: Secondary | ICD-10-CM | POA: Diagnosis not present

## 2021-09-26 DIAGNOSIS — Z6823 Body mass index (BMI) 23.0-23.9, adult: Secondary | ICD-10-CM

## 2021-09-26 DIAGNOSIS — Z7901 Long term (current) use of anticoagulants: Secondary | ICD-10-CM

## 2021-09-26 DIAGNOSIS — R0789 Other chest pain: Secondary | ICD-10-CM | POA: Diagnosis not present

## 2021-09-26 DIAGNOSIS — Z515 Encounter for palliative care: Secondary | ICD-10-CM | POA: Diagnosis not present

## 2021-09-26 DIAGNOSIS — J431 Panlobular emphysema: Secondary | ICD-10-CM | POA: Diagnosis not present

## 2021-09-26 DIAGNOSIS — Z91040 Latex allergy status: Secondary | ICD-10-CM

## 2021-09-26 DIAGNOSIS — J9601 Acute respiratory failure with hypoxia: Secondary | ICD-10-CM | POA: Diagnosis not present

## 2021-09-26 HISTORY — DX: Acute and chronic respiratory failure with hypercapnia: J96.22

## 2021-09-26 LAB — COMPREHENSIVE METABOLIC PANEL
ALT: 10 U/L (ref 0–44)
AST: 17 U/L (ref 15–41)
Albumin: 3.1 g/dL — ABNORMAL LOW (ref 3.5–5.0)
Alkaline Phosphatase: 93 U/L (ref 38–126)
Anion gap: 12 (ref 5–15)
BUN: 13 mg/dL (ref 8–23)
CO2: 24 mmol/L (ref 22–32)
Calcium: 8.7 mg/dL — ABNORMAL LOW (ref 8.9–10.3)
Chloride: 93 mmol/L — ABNORMAL LOW (ref 98–111)
Creatinine, Ser: 1.34 mg/dL — ABNORMAL HIGH (ref 0.61–1.24)
GFR, Estimated: 57 mL/min — ABNORMAL LOW (ref >60)
Glucose, Bld: 131 mg/dL — ABNORMAL HIGH (ref 70–99)
Potassium: 3.8 mmol/L (ref 3.5–5.1)
Sodium: 129 mmol/L — ABNORMAL LOW (ref 135–145)
Total Bilirubin: 1.3 mg/dL — ABNORMAL HIGH (ref 0.3–1.2)
Total Protein: 7.1 g/dL (ref 6.5–8.1)

## 2021-09-26 LAB — CBC WITH DIFFERENTIAL/PLATELET
Abs Immature Granulocytes: 0.14 10*3/uL — ABNORMAL HIGH (ref 0.00–0.07)
Basophils Absolute: 0 10*3/uL (ref 0.0–0.1)
Basophils Relative: 0 %
Eosinophils Absolute: 0.1 10*3/uL (ref 0.0–0.5)
Eosinophils Relative: 0 %
HCT: 39.2 % (ref 39.0–52.0)
Hemoglobin: 12.2 g/dL — ABNORMAL LOW (ref 13.0–17.0)
Immature Granulocytes: 1 %
Lymphocytes Relative: 1 %
Lymphs Abs: 0.2 10*3/uL — ABNORMAL LOW (ref 0.7–4.0)
MCH: 26.2 pg (ref 26.0–34.0)
MCHC: 31.1 g/dL (ref 30.0–36.0)
MCV: 84.1 fL (ref 80.0–100.0)
Monocytes Absolute: 0.2 10*3/uL (ref 0.1–1.0)
Monocytes Relative: 1 %
Neutro Abs: 17.4 10*3/uL — ABNORMAL HIGH (ref 1.7–7.7)
Neutrophils Relative %: 97 %
Platelets: 265 10*3/uL (ref 150–400)
RBC: 4.66 MIL/uL (ref 4.22–5.81)
RDW: 17.7 % — ABNORMAL HIGH (ref 11.5–15.5)
WBC: 18.1 10*3/uL — ABNORMAL HIGH (ref 4.0–10.5)
nRBC: 0 % (ref 0.0–0.2)

## 2021-09-26 LAB — BRAIN NATRIURETIC PEPTIDE: B Natriuretic Peptide: 104.2 pg/mL — ABNORMAL HIGH (ref 0.0–100.0)

## 2021-09-26 LAB — I-STAT ARTERIAL BLOOD GAS, ED
Acid-Base Excess: 2 mmol/L (ref 0.0–2.0)
Bicarbonate: 27.9 mmol/L (ref 20.0–28.0)
Calcium, Ion: 1.19 mmol/L (ref 1.15–1.40)
HCT: 40 % (ref 39.0–52.0)
Hemoglobin: 13.6 g/dL (ref 13.0–17.0)
O2 Saturation: 96 %
Potassium: 3.8 mmol/L (ref 3.5–5.1)
Sodium: 129 mmol/L — ABNORMAL LOW (ref 135–145)
TCO2: 29 mmol/L (ref 22–32)
pCO2 arterial: 47 mmHg (ref 32–48)
pH, Arterial: 7.381 (ref 7.35–7.45)
pO2, Arterial: 83 mmHg (ref 83–108)

## 2021-09-26 LAB — RESP PANEL BY RT-PCR (FLU A&B, COVID) ARPGX2
Influenza A by PCR: NEGATIVE
Influenza B by PCR: NEGATIVE
SARS Coronavirus 2 by RT PCR: NEGATIVE

## 2021-09-26 LAB — TROPONIN I (HIGH SENSITIVITY)
Troponin I (High Sensitivity): 17 ng/L (ref <18)
Troponin I (High Sensitivity): 15 ng/L (ref <18)

## 2021-09-26 MED ORDER — METHYLPREDNISOLONE SODIUM SUCC 125 MG IJ SOLR
125.0000 mg | Freq: Once | INTRAMUSCULAR | Status: AC
  Administered 2021-09-26: 125 mg via INTRAVENOUS
  Filled 2021-09-26: qty 2

## 2021-09-26 MED ORDER — ACETAMINOPHEN 325 MG PO TABS
650.0000 mg | ORAL_TABLET | Freq: Four times a day (QID) | ORAL | Status: DC | PRN
  Administered 2021-09-28 – 2021-10-02 (×2): 650 mg via ORAL
  Filled 2021-09-26 (×3): qty 2

## 2021-09-26 MED ORDER — FLECAINIDE ACETATE 50 MG PO TABS
75.0000 mg | ORAL_TABLET | Freq: Two times a day (BID) | ORAL | Status: DC
Start: 2021-09-26 — End: 2021-10-03
  Administered 2021-09-27 – 2021-10-03 (×13): 75 mg via ORAL
  Filled 2021-09-26 (×15): qty 2

## 2021-09-26 MED ORDER — POLYETHYLENE GLYCOL 3350 17 G PO PACK: 17.0000 g | PACK | Freq: Every day | ORAL | Status: DC | PRN

## 2021-09-26 MED ORDER — ROSUVASTATIN CALCIUM 5 MG PO TABS
5.0000 mg | ORAL_TABLET | Freq: Every day | ORAL | Status: DC
Start: 2021-09-27 — End: 2021-10-03
  Administered 2021-09-27 – 2021-10-03 (×7): 5 mg via ORAL
  Filled 2021-09-26 (×7): qty 1

## 2021-09-26 MED ORDER — FLUTICASONE PROPIONATE 50 MCG/ACT NA SUSP
1.0000 | Freq: Two times a day (BID) | NASAL | Status: DC | PRN
  Administered 2021-09-27 – 2021-10-03 (×5): 1 via NASAL
  Filled 2021-09-26 (×2): qty 16

## 2021-09-26 MED ORDER — ACETAMINOPHEN 650 MG RE SUPP: 650.0000 mg | Freq: Four times a day (QID) | RECTAL | Status: DC | PRN

## 2021-09-26 MED ORDER — ESCITALOPRAM OXALATE 10 MG PO TABS
10.0000 mg | ORAL_TABLET | Freq: Every day | ORAL | Status: DC
  Administered 2021-09-27 – 2021-10-03 (×7): 10 mg via ORAL
  Filled 2021-09-26 (×7): qty 1

## 2021-09-26 MED ORDER — IPRATROPIUM-ALBUTEROL 0.5-2.5 (3) MG/3ML IN SOLN
3.0000 mL | RESPIRATORY_TRACT | Status: DC | PRN
  Administered 2021-09-27 – 2021-09-30 (×4): 3 mL via RESPIRATORY_TRACT
  Filled 2021-09-26 (×6): qty 3

## 2021-09-26 MED ORDER — ALPRAZOLAM 0.5 MG PO TABS
1.0000 mg | ORAL_TABLET | Freq: Every evening | ORAL | Status: DC | PRN
  Administered 2021-09-28 – 2021-10-02 (×5): 1 mg via ORAL
  Filled 2021-09-26 (×5): qty 2

## 2021-09-26 MED ORDER — ALBUTEROL SULFATE (2.5 MG/3ML) 0.083% IN NEBU
2.5000 mg | INHALATION_SOLUTION | RESPIRATORY_TRACT | Status: DC | PRN
  Administered 2021-10-02: 2.5 mg via RESPIRATORY_TRACT
  Filled 2021-09-26: qty 3

## 2021-09-26 MED ORDER — PANTOPRAZOLE SODIUM 40 MG PO TBEC
40.0000 mg | DELAYED_RELEASE_TABLET | Freq: Every day | ORAL | Status: DC
  Administered 2021-09-27 – 2021-10-03 (×7): 40 mg via ORAL
  Filled 2021-09-26 (×7): qty 1

## 2021-09-26 MED ORDER — METOPROLOL TARTRATE 25 MG PO TABS
12.5000 mg | ORAL_TABLET | Freq: Two times a day (BID) | ORAL | Status: DC
  Filled 2021-09-26: qty 1

## 2021-09-26 MED ORDER — BUDESON-GLYCOPYRROL-FORMOTEROL 160-9-4.8 MCG/ACT IN AERO: 2.0000 | INHALATION_SPRAY | Freq: Two times a day (BID) | RESPIRATORY_TRACT | Status: DC

## 2021-09-26 MED ORDER — EMPAGLIFLOZIN 10 MG PO TABS
10.0000 mg | ORAL_TABLET | Freq: Every day | ORAL | Status: DC
  Administered 2021-09-27 – 2021-10-03 (×7): 10 mg via ORAL
  Filled 2021-09-26 (×7): qty 1

## 2021-09-26 MED ORDER — FUROSEMIDE 10 MG/ML IJ SOLN
40.0000 mg | Freq: Once | INTRAMUSCULAR | Status: AC
  Administered 2021-09-26: 40 mg via INTRAVENOUS
  Filled 2021-09-26: qty 4

## 2021-09-26 MED ORDER — IPRATROPIUM-ALBUTEROL 0.5-2.5 (3) MG/3ML IN SOLN
3.0000 mL | Freq: Once | RESPIRATORY_TRACT | Status: AC
  Administered 2021-09-26: 3 mL via RESPIRATORY_TRACT
  Filled 2021-09-26: qty 3

## 2021-09-26 MED ORDER — APIXABAN 5 MG PO TABS
5.0000 mg | ORAL_TABLET | Freq: Two times a day (BID) | ORAL | Status: DC
Start: 2021-09-26 — End: 2021-10-03
  Administered 2021-09-27 – 2021-10-03 (×13): 5 mg via ORAL
  Filled 2021-09-26 (×14): qty 1

## 2021-09-26 MED ORDER — HYPROMELLOSE (GONIOSCOPIC) 2.5 % OP SOLN: 1.0000 [drp] | Freq: Three times a day (TID) | OPHTHALMIC | Status: DC | PRN

## 2021-09-26 NOTE — Hospital Course (Addendum)
Acute on chronic hypoxic and hypercapnic respiratory failure Acute on chronic diastolic CHF Pleural effusions, bilateral, chronic Patient presented with increased dyspnea and wheezing minimally responsive to home breathing treatments with orthopnea and PND. On presentation he required 15L NRB to maintain O2 saturations >90%. He was able to titrate down to 5L HFNC. Initial work-up revealed flat trops, mildly elevated BNP to 104.2, ABG with pCO2 47 and pH 7.38. Chest xray revealed bilateral bleural effusions, cardiomegaly, and pulmonary vascular congestion. Patient has had evidence of bilateral pleural effusions since operation for aortic aneurysm in 2018.  He was started on Lasix 40 mg IV daily and continued on home Jardiance. Diuresis was advanced to Lasix 80 mg IV 02/19 with improvement of respiratory symptoms. Electrolytes were repleted as necessary.  He bedside thoracentesis 02/21 which was ultimately felt to be transudative in nature and did not have signs of infection or malignancy. CT chest was obtained 02/22 which showed small bilateral pleural effusions, partially loculated, mildly increased since previous exam;  foci of air within the right  pleural effusion and at the right apex consistent with minimal pneumothorax, likely related to thoracentesis 09/30/2021; persistent atelectasis and or infiltrates and scarring in both lungs without obvious pulmonary mass; known thoracic aortic dissection post stenting.  He was transitioned to torsemide p.o. on 02/22.  Patient is on home oxygen with appropriate saturations at time of discharge.  COPD On initial presentation patient required up to 15 L HFNC but was weaned successfully to 5L HFNC with some fluctuations. Home albuterol, duonebs, and butisol-glycopyrrolate-formoterol treatments were continued.  PT/OT worked with the patient and recommended home health PT/nursing.  Given COPD history and wheezing he was initiated on therapy for possible COPD  exacerbation on 02/23 which included prednisone and doxycycline.  Atrial fibrillation on eliquis Patient was in atrial fibrillation with intermittent episodes of rate control which improved with aggressive diuresis and continuance of home regimen of flecainide and metoprolol. He was continued on eliquis as well.  He returned to sinus rhythm with diuresis.  HLD Chronic condition. Continued home crestor.  CKD stage IIIa Chronic condition.  History of enterococcal endocarditis Patient was maintained on contact cautions throughout this admission.  He had a mild leukocytosis however remained afebrile and hemodynamically stable.  Anxiety Depression Chronic conditions. Continued home lexapro and Xanax as needed.

## 2021-09-26 NOTE — H&P (Shared)
Date: 09/26/2021               Patient Name:  Sean Riley MRN: [id number]  DOB: [birth year] Age / Sex: 71 y.o., male   PCP: Colon Branch, MD         Medical Service: Internal Medicine Teaching Service         Attending Physician: Dr. Lennice Sites, DO    First Contact: Dr. Marlou Sa Pager: [phone]  Second Contact: Dr. Johnney Ou Pager: [phone]       After Hours (After 5p/  First Contact Pager: [phone]  weekends / holidays): Second Contact Pager: [phone]   Chief Complaint: ***  History of Present Illness: TAREZ BOWNS is a 71 y.o. male   h/o copd 4-5 l at home, hf, chronic pleural effusions Here for SOB Breathing tx at home had to inc [place] without much relief On 10 L earlier Dropping in 80s On bipap and got a blood has hypercarbic but ph normal Trop and bnp ok  Breathing tx, steroids, and a dose of lasix More copd exacerbation? Thora over the weekend?  Thora in 2019   Meds: *** No outpatient medications have been marked as taking for the 09/26/21 encounter [facility] Encounter).    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 LUTS    Hypertension    Microscopic hematuria    negative cystoscopy   NONSPECIFIC ABNORMAL ELECTROCARDIOGRAM    Pleural effusion on right    Pleural effusion, bilateral    S/P aortic dissection repair 03/24/2017   Biological Bentall aortic root  replacement + resection and grafting of entire ascending aorta, transverse aortic arch and proximal descending thoracic aorta with elephant trunk distal anastomosis and debranching of aortic arch vessels   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   Physical Exam: Blood pressure 99/73, pulse (!) 105, temperature 98.1 F (36.7 C), temperature source Oral, resp. rate (!) 25, SpO2 100 %. General: NAD, nl appearance HE: Normocephalic, atraumatic, EOMI, Conjunctivae normal ENT: No congestion, no rhinorrhea, no exudate or erythema  Cardiovascular: Normal rate, regular rhythm. No murmurs, rubs, or gallops Pulmonary: Effort normal, breath sounds normal. No wheezes, rales, or rhonchi Abdominal: soft, nontender, bowel sounds present Musculoskeletal: no swelling, deformity, injury or tenderness in extremities Skin: Warm, dry, no bruising, erythema, or rash Psychiatric/Behavioral: normal mood, normal behavior     EKG: personally reviewed my interpretation is***  CXR: personally reviewed my interpretation is***  Assessment &  Plan by Problem: Active Problems:   * No active hospital problems. *   Dispo: Admit patient to {STATUS:3044014::"Observation with expected length of stay less than 2 midnights.","Inpatient with expected length of stay greater than 2 midnights."}   Signed: Orvis Brill, MD 09/26/2021, 7:36 PM  Pager: [phone] After 5pm on weekdays and 1pm on weekends: On Call pager: [phone]

## 2021-09-26 NOTE — ED Triage Notes (Signed)
From home with hx COPD/smoker, SOB today with labored breathing/diaphoretic, on home 02 3-4 liters, increased to 6 liters still sats 80's, placed on 15 liter NRB by medic and sats increased to 97%, solumedrol IVP.

## 2021-09-26 NOTE — Progress Notes (Signed)
Found pt off bipap for break. PT awake and alert. Will continue monitoring pt.

## 2021-09-26 NOTE — ED Notes (Signed)
Pt asked for a break from [place]. NRB placed, O2 93%

## 2021-09-26 NOTE — ED Provider Notes (Addendum)
Patient signed out to me at 3 PM.  Here with shortness of breath.  Normally on about 5 L of oxygen at home.  Has been having increased oxygen need over the last several days especially with ambulation.  Shortness of breath symptoms for the last several days.  Viral type symptoms it sounds like.  Has a history of COPD, CHF, CAD, history of pleural effusions as well.  Maybe has had some chest pain associated with the shortness of breath the last several days.  Also describes some congestion as well.  Upon my evaluation patient with increased work of breathing and we will place him on BiPAP add a ABG.  He has all lab work pending including CBC, CMP, BNP, troponin, chest x-ray.  EKG per my review interpretation shows sinus rhythm.  No ischemic changes.  He has diminished breath sounds throughout.  Poor air movement.  No peripheral edema.  May be some mild JVD.  Overall suspect multifactorial shortness of breath.  Likely partly COPD exacerbation versus CHF.  Seems less likely to be acute coronary syndrome.  Will order breathing treatment and IV steroids.  Will order IV Lasix.  Upon reviewing interpretations of lab.  Blood gas is overall reassuring.  Does not have any significant hypercarbia.  White count is 18.  Chest x-ray shows some mildly increased size of bilateral pleural effusions.  Some vascular congestion.  No obvious pneumonia.  Patient is on blood thinner.  Doubt PE.  Patient with troponin of 17.  COVID and flu test negative.  BNP is still pending however suspect that this is acute on chronic respiratory failure in the setting of COPD and volume overload, pleural effusions.  Possibly may benefit from therapeutic thoracentesis.  Appears stable on BiPAP.  Will admit to medicine for further care.  This chart was dictated using voice recognition software.  Despite best efforts to proofread,  errors can occur which can change the documentation meaning.    Lennice Sites, DO 09/26/21 [place], [place],  DO 09/26/21 1922

## 2021-09-26 NOTE — ED Provider Notes (Signed)
[facility] EMERGENCY DEPARTMENT Provider Note   CSN: [id number] Arrival date & time: 09/26/21  1351     History  Chief Complaint  Patient presents with   Shortness of Breath    Sean Riley is a 72 y.o. male.  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  Reports that dyspnea has been present over the last couple of weeks, but significantly worse in the last 3 to 4 days.  Reports significant dyspnea on exertion.  Reports he has chronic orthopnea which has not changed and normally sleeps in a recliner.  He denies cough, chest pain, nausea, vomiting, abdominal pain, diarrhea, black or bloody stools.  He does not feel that he has been wheezing significantly, has used some breathing treatments at home and feels minimal relief.  He feels like he has more congested type of breathing than wheezing.  Denies any known fevers.  Denies leg swelling.  He was found to be hypoxic to the 80s on 6 L with EMS and was placed on 15 L nonrebreather with increase in saturation, and given Solu-Medrol.    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 LUTS    Hypertension    Microscopic hematuria    negative cystoscopy   NONSPECIFIC ABNORMAL ELECTROCARDIOGRAM    Pleural effusion on right    Pleural effusion, bilateral    S/P aortic dissection repair 03/24/2017   Biological Bentall aortic root replacement + resection and grafting of  entire ascending aorta, transverse aortic arch and proximal descending thoracic aorta with elephant trunk distal anastomosis and debranching of aortic arch vessels   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   25 mm [facility] Ease bovine pericardial tissue valve and 28 mm Gelweave Valsalva aortic root graft with reimplantation of left main and right coronary arteries   Varicose veins     Past Surgical History:  Procedure Laterality Date   APPENDECTOMY     CARDIAC CATHETERIZATION      X3;Dr Caryl Comes, last 01/20/12: mild non-obstructive CAD, normal LV systolic function   CARDIOVERSION N/A 05/31/2018   Procedure: CARDIOVERSION;  Surgeon: Sueanne Margarita, MD;  Location: [facility] ENDOSCOPY;  Service: Cardiovascular;  Laterality: N/A;   CARDIOVERSION N/A 06/13/2020   Procedure: CARDIOVERSION;  Surgeon: Larey Dresser, MD;  Location: [facility] ENDOSCOPY;  Service: Cardiovascular;  Laterality: N/A;   COLONOSCOPY     COLONOSCOPY N/A 01/27/2021   Procedure: COLONOSCOPY;  Surgeon: Gatha Mayer, MD;  Location: [facility] ENDOSCOPY;  Service: Endoscopy;  Laterality: N/A;   CYSTOSCOPY  1978   Dr Hartley Barefoot   ESOPHAGOGASTRODUODENOSCOPY N/A 01/27/2021   Procedure: ESOPHAGOGASTRODUODENOSCOPY (EGD);  Surgeon: Gatha Mayer, MD;  Location: [facility] ENDOSCOPY;  Service: Endoscopy;  Laterality: N/A;   EYE SURGERY     GIVENS CAPSULE STUDY N/A 01/27/2021   Procedure: GIVENS CAPSULE STUDY;  Surgeon: Gatha Mayer, MD;  Location: [place];  Service:  Endoscopy;  Laterality: N/A;   IR THORACENTESIS ASP PLEURAL SPACE W/IMG GUIDE  11/25/2017   KNEE ARTHROSCOPY  2012   Dr Theda Sers   LUMBAR LAMINECTOMY  1987   Dr Lestine Box  2007   PERICARDIOCENTESIS N/A 04/07/2017   Procedure: PERICARDIOCENTESIS;  Surgeon: Sherren Mocha, MD;  Location: [place] CV LAB;  Service: Cardiovascular;  Laterality: N/A;   POLYPECTOMY     POLYPECTOMY  01/27/2021   Procedure: POLYPECTOMY;  Surgeon: Gatha Mayer,  MD;  Location[facility] ENDOSCOPY;  Service: Endoscopy;;   PRESSURE SENSOR/CARDIOMEMS N/A 12/26/2020   Procedure: PRESSURE SENSOR/CARDIOMEMS;  Surgeon: Larey Dresser, MD;  Location: [place] CV LAB;  Service: Cardiovascular;  Laterality: N/A;   REPAIR OF ACUTE ASCENDING THORACIC AORTIC DISSECTION N/A 03/23/2017   Procedure: REPAIR OF ACUTE ASCENDING THORACIC AORTIC DISSECTION.  Bentall procedure.  Aortic root repleacement with bioprosthetic valve.  Reimplantation of left and right coronary arteries.  Total resection of transverse aortic arch.  Elephant trunk distal anastomosis and debranching of arch vessels.;  Surgeon: Rexene Alberts, MD;  Location: [place];  Service: Vascular;  Laterality: N/A;   RIGHT HEART CATH N/A 06/15/2019   Procedure: RIGHT HEART CATH;  Surgeon: Larey Dresser, MD;  Location: [place] CV LAB;  Service: Cardiovascular;  Laterality: N/A;   ROTATOR CUFF REPAIR     SHOULDER ARTHROSCOPY  2011   Dr Theda Sers   SHOULDER ARTHROSCOPY Right 08/2015   SPINAL FUSION  1986   Dr Rolin Barry   TEE WITHOUT CARDIOVERSION N/A 03/23/2017   Procedure: TRANSESOPHAGEAL ECHOCARDIOGRAM (TEE);  Surgeon: Rexene Alberts, MD;  Location: [place];  Service: Open Heart Surgery;  Laterality: N/A;   TEE WITHOUT CARDIOVERSION N/A 08/21/2021   Procedure: TRANSESOPHAGEAL ECHOCARDIOGRAM (TEE);  Surgeon: Larey Dresser, MD;  Location: [facility] ENDOSCOPY;  Service: Cardiovascular;  Laterality: N/A;   THORACIC AORTIC ENDOVASCULAR STENT GRAFT N/A 03/30/2018   Procedure: THORACIC AORTIC ENDOVASCULAR STENT GRAFT WITH INTRAVASCULAR ULTRASOUND;  Surgeon: Serafina Mitchell, MD;  Location: [facility] OR;  Service: Vascular;  Laterality: N/A;   TRACHEOSTOMY     age 89 for croup     Home Medications Prior to Admission medications   Medication Sig Start Date End Date Taking? Authorizing Provider  escitalopram (LEXAPRO) 10 MG tablet TAKE 1 TABLET DAILY 09/16/21   Colon Branch, MD  albuterol [facility] HFA) [phone] Base) MCG/ACT inhaler  Inhale 2 puffs into the lungs every 4 (four) hours as needed for wheezing or shortness of breath. 06/09/21   Mannam, Hart Robinsons, MD  ALPRAZolam (XANAX) 1 MG tablet Take 1 mg by mouth at bedtime.    [provider]  apixaban (ELIQUIS) 5 MG TABS tablet Take 1 tablet (5 mg total) by mouth 2 (two) times daily. 08/22/21   Shelly Coss, MD  bisacodyl (DULCOLAX) 5 MG EC tablet Take 5 mg by mouth daily as needed for moderate constipation.    [provider]  Budeson-Glycopyrrol-Formoterol (BREZTRI AEROSPHERE) 160-9-4.8 MCG/ACT AERO Inhale 2 puffs into the lungs in the morning and at bedtime. 06/09/21   Mannam, Hart Robinsons, MD  esomeprazole (NEXIUM) 40 MG capsule Take 1 capsule (40 mg total) by mouth daily before breakfast. 01/05/17   Kozlow, Donnamarie Poag, MD  flecainide (TAMBOCOR) 50 MG tablet Take 1.5 tablets (75 mg total) by mouth 2 (two) times daily. 08/22/21   Shelly Coss, MD  fluticasone (FLONASE) 50 MCG/ACT nasal spray Place 1 spray into both nostrils 2 (two) times daily as needed for allergies.  Use one spray in each nostril twice daily. 05/31/18   Sueanne Margarita, MD  hydroxypropyl methylcellulose / hypromellose (ISOPTO TEARS / GONIOVISC) 2.5 % ophthalmic solution Place 1-2 drops into both eyes 3 (three) times daily as needed (dry/irritated eyes).    [provider]  ipratropium-albuterol (DUONEB) 0.5-2.5 (3) MG/3ML SOLN Take 3 mLs by nebulization every 4 (four) hours as needed. 09/08/21   Parrett, Fonnie Mu, NP  JARDIANCE 10 MG TABS tablet TAKE 1 TABLET DAILY BEFORE BREAKFAST 04/23/21   Larey Dresser, MD  magnesium hydroxide (MILK OF MAGNESIA) 400 MG/5ML suspension Take 7.5 mLs by mouth daily as needed (stomach cramps/constipation).    [provider]  metoprolol tartrate (LOPRESSOR) 25 MG tablet Take 0.5 tablets (12.5 mg total) by mouth 2 (two) times daily. Please make yearly appt with Dr. Caryl Comes for January 2022 for future refills. Thank you 1st attempt 09/08/20   Shelly Coss, MD  OXYGEN Inhale 5 L/min into the lungs continuous.    [provider]  potassium chloride SA (KLOR-CON M) 20 MEQ tablet Take 40 mEq by mouth at bedtime.    [provider]  rosuvastatin (CRESTOR) 5 MG tablet Take 5 mg by mouth at bedtime.    [provider]  torsemide (DEMADEX) 20 MG tablet Take 3 tablets (60 mg total) by mouth daily. 08/22/21   Shelly Coss, MD  triamcinolone cream (KENALOG) 0.5 % Apply 1 application topically 3 (three) times daily. Patient taking differently: Apply 1 application topically 3 (three) times daily. As needed 07/17/21   Colon Branch, MD      Allergies    Simvastatin, Celecoxib, Codeine, Nsaids, Tape, Cephalexin, and Latex    Review of Systems   Review of Systems See above  Physical Exam Updated Vital Signs BP 131/70 (BP Location: Right Arm)    Pulse 95    Temp 98.1 F (36.7 C) (Oral)    SpO2 97%  Physical Exam Vitals and nursing note reviewed.  Constitutional:      General: He is not in acute distress.    Appearance: He is well-developed. He is not diaphoretic.  HENT:     Head: Normocephalic and atraumatic.  Eyes:     Conjunctiva/sclera: Conjunctivae normal.  Neck:     Vascular: JVD present.  Cardiovascular:     Rate and Rhythm: Normal rate and regular rhythm.     Heart sounds: Murmur heard.    No friction rub. No gallop.  Pulmonary:     Effort: Pulmonary effort is normal. No respiratory distress.     Breath sounds: Rhonchi and rales (coarse) present. No wheezing.  Abdominal:     General: There is no distension.     Palpations: Abdomen is soft.     Tenderness: There is no abdominal tenderness. There is no guarding.  Musculoskeletal:     Cervical back: Normal range of motion.     Right lower leg: Right lower leg edema: trace.     Left lower leg: Left lower leg edema: trace.  Skin:    General: Skin is warm and dry.  Neurological:     Mental Status: He is alert and oriented to person, place, and time.     ED Results / Procedures / Treatments   Labs (all labs ordered are listed, but only abnormal results are displayed) Labs Reviewed  RESP PANEL BY RT-PCR (FLU A&B, COVID) ARPGX2  CBC WITH DIFFERENTIAL/PLATELET  COMPREHENSIVE METABOLIC PANEL  BRAIN NATRIURETIC PEPTIDE  TROPONIN I (HIGH SENSITIVITY)  EKG EKG Interpretation  Date/Time:  Friday September 26 2021 14:06:22 EST Ventricular Rate:  94 PR Interval:  193 QRS Duration: 160 QT Interval:  414 QTC Calculation: 518 R Axis:   -75 Text Interpretation: Sinus rhythm Probable left atrial enlargement RBBB and LAFB Confirmed by Ronnald Nian, Adam ([id number]) on 09/26/2021 3:00:29 PM  Radiology No results found.  Procedures Procedures    Medications Ordered in ED Medications - No data to display  ED Course/ Medical Decision Making/ A&P                           Medical Decision Making Amount and/or Complexity of Data Reviewed Labs: ordered. Radiology: ordered.  Risk Prescription drug management. Decision regarding hospitalization.    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   Differential diagnosis for dyspnea includes ACS, PE, COPD exacerbation, CHF exacerbation, anemia, pneumonia, viral etiology such as COVID 19 infection, metabolic abnormality.   EKG was evaluated by me which showed no acute changes in comparison to prior.    Ordered CXR, CBC, CMP, troponin, BNP.  Clinically most consistent with CHF, coarse rhonchi, JVD, hypoxia. Doubt PE given compliant with eliquis.    Signed out to Dr. Ronnald Nian with imaging, labs pending.          Final Clinical Impression(s) / ED Diagnoses Final diagnoses:  Shortness of breath    Rx / DC Orders ED  Discharge Orders     None         Gareth Morgan, MD 09/26/21 2303

## 2021-09-26 NOTE — H&P (Signed)
Date: 09/26/2021               Patient Name:  Sean Riley MRN: [id number]  DOB: 1950/11/04 Age / Sex: 71 y.o., male   PCP: Colon Branch, MD         Medical Service: Internal Medicine Teaching Service         Attending Physician: Dr. Lucious Groves, DO    First Contact: Dr. Marlou Sa Pager: [phone]  Second Contact: Dr. Johnney Ou Pager: [phone]       After Hours (After 5p/  First Contact Pager: [phone]  weekends / holidays): Second Contact Pager: [phone]   Chief Complaint: SOB  History of Present Illness: [name] is a 71 y.o. male with a PMHx of COPD on home oxygen (4-5L), A. fib on Eliquis/volume overload, ascending aortic dissection s/p repair, atrial valve replacement, hypertension, GI bleed, and GERD who presents here today with complaints of worsening shortness of breath.  The patient states that his shortness of breath has been slowly worsening for the past few weeks, however most recently about 2-3 days ago, his SOB significantly worsened to the point that he required extra breathing treatments at home.  Also endorses worsening wheezing as well as a chronic cough that has been unchanged the past few days. +orthopnea, PND.  Denies worsening BLE swelling.  Denies sick contacts or recent illness.  Patient has been compliant with all of his medications at home, including his Eliquis.  No dietary indiscretion.  Denies headache, chest pain, abdominal pain, dysuria, constipation, diarrhea.  No other complaints or concerns today.  Meds:  Current Meds  Medication Sig   albuterol (PROAIR HFA) 108 (90 Base) MCG/ACT inhaler Inhale 2 puffs into the lungs every 4 (four) hours as needed for wheezing or shortness of breath.   ALPRAZolam (XANAX) 1 MG tablet Take 1 mg by mouth at bedtime.   apixaban (ELIQUIS) 5 MG TABS tablet Take 1 tablet (5 mg total) by mouth 2 (two) times daily.   bisacodyl (DULCOLAX) 5 MG EC tablet Take 5 mg by mouth daily as needed for moderate constipation.    Budeson-Glycopyrrol-Formoterol (BREZTRI AEROSPHERE) 160-9-4.8 MCG/ACT AERO Inhale 2 puffs into the lungs in the morning and at bedtime.   escitalopram (LEXAPRO) 10 MG tablet TAKE 1 TABLET DAILY (Patient taking differently: Take 10 mg by mouth daily.)   esomeprazole (NEXIUM) 40 MG capsule Take 1 capsule (40 mg total) by mouth daily before breakfast.   flecainide (TAMBOCOR) 50 MG tablet Take 1.5 tablets (75 mg total) by mouth 2 (two) times daily.   fluticasone (FLONASE) 50 MCG/ACT nasal spray Place 1 spray into both nostrils 2 (two) times daily as needed for allergies. Use one spray in each nostril twice daily.   hydroxypropyl methylcellulose / hypromellose (ISOPTO TEARS / GONIOVISC) 2.5 % ophthalmic solution Place 1-2 drops into both eyes 3 (three) times daily as needed (dry/irritated eyes).   ipratropium-albuterol (DUONEB) 0.5-2.5 (3) MG/3ML SOLN Take 3 mLs by nebulization every 4 (four) hours as needed. (Patient taking differently: Take 3 mLs by nebulization every 4 (four) hours as needed (shortness of breath).)   JARDIANCE 10 MG TABS tablet TAKE 1 TABLET DAILY BEFORE BREAKFAST (Patient taking differently: Take 10 mg by mouth daily.)   magnesium hydroxide (MILK OF MAGNESIA) 400 MG/5ML suspension Take 7.5 mLs by mouth daily as needed (stomach cramps/constipation).   metoprolol tartrate (LOPRESSOR) 25 MG tablet Take 0.5 tablets (12.5 mg total) by mouth 2 (two) times daily. Please  make yearly appt with Dr. Caryl Comes for January 2022 for future refills. Thank you 1st attempt   OXYGEN Inhale 5 L/min into the lungs continuous.   potassium chloride SA (KLOR-CON M) 20 MEQ tablet Take 40 mEq by mouth at bedtime.   rosuvastatin (CRESTOR) 5 MG tablet Take 5 mg by mouth at bedtime.   torsemide (DEMADEX) 20 MG tablet Take 3 tablets (60 mg total) by mouth daily.   triamcinolone cream (KENALOG) 0.5 % Apply 1 application topically 3 (three) times daily. (Patient taking differently: Apply 1 application topically 3  (three) times daily as needed (rash).)    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 LUTS    Hypertension    Microscopic hematuria    negative cystoscopy   NONSPECIFIC ABNORMAL ELECTROCARDIOGRAM    Pleural effusion on right    Pleural effusion, bilateral    S/P aortic dissection repair 03/24/2017   Biological Bentall aortic root replacement + resection and grafting of entire ascending aorta, transverse aortic arch and proximal descending thoracic aorta with elephant trunk distal anastomosis and debranching of aortic arch vessels   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  Ambulates with a cane.  Is able to complete ADLs/IADLs independently.  Denies current tobacco use.  Previously, he had intermittently smoked for 30 years, about 2 packs/day. Endorses drinking about 1-2 beers a day but has not had an alcoholic beverage in the past 2 weeks.  Review of Systems: A complete ROS was negative except as per HPI.   Physical Exam: Blood pressure 120/84, pulse (!) 116, temperature 98.1 F (36.7  C), temperature source Oral, resp. rate (!) 26, SpO2 91 %. General: NAD, nl appearance, breathing comfortably on 5L HFNC HE: Normocephalic, atraumatic, EOMI, Conjunctivae normal ENT: No congestion, no rhinorrhea, no exudate or erythema  Cardiovascular: Normal rate, regular rhythm. + systolic murmur. No rubs or gallops. Pulmonary: Effort normal, +rhonchi and coarse rales Abdominal: soft, nontender, bowel sounds present Musculoskeletal: no deformity, injury or tenderness in extremities. Trace BLE pitting edema. Skin: Warm, dry, no bruising, erythema, or rash Psychiatric/Behavioral: normal mood, normal behavior    EKG: personally reviewed my interpretation is sinus rhythm  CXR: personally reviewed my interpretation is mildly increased size of bilateral pleural effusions, cardiomegaly, and pulmonary vascular congestion   Assessment & Plan by Problem: Active Problems:   Acute on chronic respiratory failure with hypercapnia (HCC)  Acute on chronic hypoxic and hypercapnic respiratory failure  Acute on chronic diastolic CHF C/o worsening SOB and wheezing  with minimal improvement with breathing treatments at home. Also with orthopnea and PND. He was seen for a telephone encounter 4 days ago, and at that time he told to decrease his fluid intake and take extra torsemide 20 mg for relief of his symptoms. On arrival to the ED today, he required up to 15 L NRB to maintain O2 sats in the high 90s, now on 5L HFNC.  On exam, he has decreased breath sounds and trace BLE edema, cannot appreciate wheezing.  Trop x2 flat. BNP mildly elevated to 104.2. ABG shows pCO2 47, pH 7.38. Chest x-ray shows bilateral pleural effusions, cardiomegaly, and pulmonary vascular congestion.  His overall clinical picture seems more consistent with CHF exacerbation vs less likely COPD exacerbation. Pleural effusions also likely contributing to his worsening respiratory status. -Continue ongoing diuresis -Continue Jardiance 10 mg  daily -Wean oxygen as tolerated -Trend BMP -Daily weights -Strict I/O's -Telemetry  Pleural effusions, chronic Followed by pulmonology in the outpatient setting.  Patient underwent thoracentesis in April 2019, however unable to locate pleural studies from the procedure.  Pulmonology believes these are likely postoperative fluid collections from his aortic surgery in 2018, likely CHF component as well with increased size of the bilateral effusions on CXR today. Concern for transudative effusions and may need thoracentesis given worsening respiratory status. -Will likely need thoracentesis over the weekend -Diuresis as above  COPD At baseline, the patient requires 4-5 L of oxygen at home.  Also on albuterol, Budeson-glycopyrrol-formoterol, and DuoNebs at home.  He is currently on 5L HFNC with O2 sats in the low 80s-high 90s.  No wheezing appreciated on exam.  Patient has received DuoNebs and IV Solu-Medrol with improvement of his symptoms. -Wean oxygen as tolerated -Albuterol inhaler twice daily -DuoNebs every 4 hours as needed -Butisol-glycopyrrolate-formoterol inhaler twice daily  Afib on eliquis Currently rate-controlled.  Patient endorses compliance with Eliquis. -Continue Eliquis 5 mg p.o. twice daily -Continue flecainide 75 mg p.o. twice daily -Continue metoprolol 12.5 mg p.o. twice daily -Telemetry  HLD Most recent lipid panel showed total cholesterol of 156, LDL 84, HDL 60.  Patient is on Crestor 5 mg at home. -Continue Crestor 5 mg daily  CKD stage IIIA Baseline creatinine is between 1.3-1.5. Labs today show BUN 13, creatinine 1.34, GFR 57. -Avoid nephrotoxic agents -Trend BMP  Anxiety Depression -Continue Lexapro 10 mg p.o. daily -Xanax 1 mg as needed at bedtime  Dispo: Admit patient to Inpatient with expected length of stay greater than 2 midnights.  Signed: Orvis Brill, MD 09/26/2021, 11:15 PM  Pager: [phone] After 5pm on weekdays and 1pm on  weekends: On Call pager: [phone]

## 2021-09-27 ENCOUNTER — Encounter (HOSPITAL_COMMUNITY): Payer: Self-pay | Admitting: Internal Medicine

## 2021-09-27 ENCOUNTER — Other Ambulatory Visit: Payer: Self-pay

## 2021-09-27 DIAGNOSIS — J431 Panlobular emphysema: Secondary | ICD-10-CM | POA: Diagnosis not present

## 2021-09-27 DIAGNOSIS — J9601 Acute respiratory failure with hypoxia: Secondary | ICD-10-CM

## 2021-09-27 DIAGNOSIS — I5033 Acute on chronic diastolic (congestive) heart failure: Secondary | ICD-10-CM | POA: Diagnosis not present

## 2021-09-27 DIAGNOSIS — J90 Pleural effusion, not elsewhere classified: Secondary | ICD-10-CM

## 2021-09-27 DIAGNOSIS — I480 Paroxysmal atrial fibrillation: Secondary | ICD-10-CM | POA: Diagnosis not present

## 2021-09-27 DIAGNOSIS — N1831 Chronic kidney disease, stage 3a: Secondary | ICD-10-CM

## 2021-09-27 DIAGNOSIS — J9622 Acute and chronic respiratory failure with hypercapnia: Secondary | ICD-10-CM | POA: Diagnosis not present

## 2021-09-27 DIAGNOSIS — L98429 Non-pressure chronic ulcer of back with unspecified severity: Secondary | ICD-10-CM | POA: Diagnosis present

## 2021-09-27 DIAGNOSIS — J449 Chronic obstructive pulmonary disease, unspecified: Secondary | ICD-10-CM

## 2021-09-27 LAB — CBC
HCT: 40.8 % (ref 39.0–52.0)
Hemoglobin: 12.7 g/dL — ABNORMAL LOW (ref 13.0–17.0)
MCH: 25.9 pg — ABNORMAL LOW (ref 26.0–34.0)
MCHC: 31.1 g/dL (ref 30.0–36.0)
MCV: 83.3 fL (ref 80.0–100.0)
Platelets: 304 10*3/uL (ref 150–400)
RBC: 4.9 MIL/uL (ref 4.22–5.81)
RDW: 17.6 % — ABNORMAL HIGH (ref 11.5–15.5)
WBC: 11 10*3/uL — ABNORMAL HIGH (ref 4.0–10.5)
nRBC: 0 % (ref 0.0–0.2)

## 2021-09-27 LAB — BASIC METABOLIC PANEL
Anion gap: 13 (ref 5–15)
BUN: 17 mg/dL (ref 8–23)
CO2: 27 mmol/L (ref 22–32)
Calcium: 9.3 mg/dL (ref 8.9–10.3)
Chloride: 93 mmol/L — ABNORMAL LOW (ref 98–111)
Creatinine, Ser: 1.41 mg/dL — ABNORMAL HIGH (ref 0.61–1.24)
GFR, Estimated: 53 mL/min — ABNORMAL LOW (ref >60)
Glucose, Bld: 182 mg/dL — ABNORMAL HIGH (ref 70–99)
Potassium: 3.5 mmol/L (ref 3.5–5.1)
Sodium: 133 mmol/L — ABNORMAL LOW (ref 135–145)

## 2021-09-27 LAB — PHOSPHORUS: Phosphorus: 3.4 mg/dL (ref 2.5–4.6)

## 2021-09-27 LAB — MAGNESIUM: Magnesium: 1.9 mg/dL (ref 1.7–2.4)

## 2021-09-27 LAB — GLUCOSE, CAPILLARY: Glucose-Capillary: 185 mg/dL — ABNORMAL HIGH (ref 70–99)

## 2021-09-27 MED ORDER — FUROSEMIDE 10 MG/ML IJ SOLN
40.0000 mg | Freq: Two times a day (BID) | INTRAMUSCULAR | Status: DC
  Administered 2021-09-27 – 2021-09-28 (×2): 40 mg via INTRAVENOUS
  Filled 2021-09-27 (×2): qty 4

## 2021-09-27 MED ORDER — METOPROLOL TARTRATE 5 MG/5ML IV SOLN
2.5000 mg | Freq: Four times a day (QID) | INTRAVENOUS | Status: DC
  Administered 2021-09-27 (×2): 2.5 mg via INTRAVENOUS
  Filled 2021-09-27 (×2): qty 5

## 2021-09-27 MED ORDER — FUROSEMIDE 10 MG/ML IJ SOLN
40.0000 mg | Freq: Once | INTRAMUSCULAR | Status: AC
  Administered 2021-09-27: 40 mg via INTRAVENOUS
  Filled 2021-09-27: qty 4

## 2021-09-27 MED ORDER — POTASSIUM CHLORIDE CRYS ER 20 MEQ PO TBCR
20.0000 meq | EXTENDED_RELEASE_TABLET | Freq: Every day | ORAL | Status: DC
  Administered 2021-09-27: 20 meq via ORAL
  Filled 2021-09-27: qty 1

## 2021-09-27 MED ORDER — UMECLIDINIUM BROMIDE 62.5 MCG/ACT IN AEPB
1.0000 | INHALATION_SPRAY | Freq: Every day | RESPIRATORY_TRACT | Status: DC
Start: 2021-09-27 — End: 2021-10-03
  Administered 2021-09-27 – 2021-10-03 (×7): 1 via RESPIRATORY_TRACT
  Filled 2021-09-27: qty 7

## 2021-09-27 MED ORDER — METOPROLOL TARTRATE 12.5 MG HALF TABLET
12.5000 mg | ORAL_TABLET | Freq: Two times a day (BID) | ORAL | Status: DC
  Administered 2021-09-27 – 2021-10-03 (×13): 12.5 mg via ORAL
  Filled 2021-09-27 (×13): qty 1

## 2021-09-27 MED ORDER — FLUTICASONE FUROATE-VILANTEROL 200-25 MCG/ACT IN AEPB
1.0000 | INHALATION_SPRAY | Freq: Every day | RESPIRATORY_TRACT | Status: DC
Start: 2021-09-27 — End: 2021-10-03
  Administered 2021-09-27 – 2021-10-03 (×7): 1 via RESPIRATORY_TRACT
  Filled 2021-09-27: qty 28

## 2021-09-27 NOTE — Plan of Care (Signed)

## 2021-09-27 NOTE — Progress Notes (Signed)
Called to bedside due to patient SPo2 dropping and needing to go back on BIPAP. Upon my arrival, RN was trying to place pt back on BIPAP but he kept hollering "take it off". I spoke with patient and he said "it's not working right". Everything was properly hooked up and pt was on previous settings. I attempted to increase pressures and increased FIo2 due to complaints of patient saying "take it off" but I explained his Spo2 would not support him coming off at this time due to it being 85%. Pt still hollering "take it off". I also explained it doesn't feel like EMS CPAP that is one constant pressure that when he breathes out, it isn't as strong as when he breathes in. Pt still pulling and pointing his finger at me and saying "take it off". I placed pt on HFNC at 8L and Spo2 93%. Pt keeps holding hand over chest. I asked patient if he was in pain and he stated "yes". RN aware and HR keeps increasing to 140's. RN states she is paging MD about HR.

## 2021-09-27 NOTE — Progress Notes (Signed)
Patient came to the unit on nasal canula at 6L of O2. The respiratory therapist mention that the pt was not on bipap since 8 pm. Pt is alert and oriented as he arrives the unit. CCMD called RN that the pt HR goes up to 140's and desat around 80's. I called the respiratory therapist to put patient back on bipap. Pt refuse. MD made aware.

## 2021-09-27 NOTE — Progress Notes (Signed)
Went to place pt on Bi-PAP and pt refused saying that he was not wearing it for the night. Vitals are stable and pt is comfortable. RT will be monitoring pt PRN for the night.

## 2021-09-27 NOTE — Progress Notes (Signed)
Pt transported to 3E02 on 5LNC. Bipap in room on standby. Pt requested break. Placed pt on 6LNC. No complications. No resp distress noted.

## 2021-09-27 NOTE — ED Notes (Addendum)
PO meds held due to pt being on BIPAP. [place] MD made aware

## 2021-09-27 NOTE — Plan of Care (Signed)

## 2021-09-27 NOTE — Progress Notes (Addendum)
HD#1 Subjective:  Overnight Events: Admitted  Mr. Rippeon is resting in bed comfortably.  He notes improvement in his breathing but is still having some shortness of breath.  He had to sleep on 2 pillows overnight with the bed reclined forward.  He notes he does sleep in a recliner at home.  He denies any worsening of his chronic cough.  He denies feeling as though he is overall swollen.  We discussed the plan to diurese with IV Lasix and if no improvement in the next few days we will plan to perform another thoracentesis.  Patient is agreeable to this.  Objective:  Vital signs in last 24 hours: Vitals:   09/27/21 0815 09/27/21 0909 09/27/21 0911 09/27/21 1200  BP:    (!) 127/59  Pulse: (!) 116 (!) 116  100  Resp: (!) 28 (!) 28 (!) 33 20  Temp:      TempSrc:      SpO2:    90%  Weight:      Height:       Supplemental O2: Nasal Cannula SpO2: 90 % O2 Flow Rate (L/min): 8 L/min FiO2 (%): 50 %   Physical Exam:  Constitutional: Ill-appearing, no acute distress HENT: normocephalic atraumatic Eyes: conjunctiva non-erythematous Neck: supple Cardiovascular: Irregularly irregular rhythm, systolic murmur present.  JVD to the mid neck Pulmonary/Chest: Saturating well on 8 L nasal cannula, diffuse rhonchi with decreased breath sounds at the bases bilaterally.  Dullness to percussion in lower lung fields Abdominal: soft, non-tender, non-distended MSK: normal bulk and tone Neurological: alert & oriented x 3 Skin: warm and dry Psych: Normal mood and thought process  Filed Weights   09/27/21 0210  Weight: 77 kg     Intake/Output Summary (Last 24 hours) at 09/27/2021 1403 Last data filed at 09/27/2021 0954 Gross per 24 hour  Intake 240 ml  Output 1225 ml  Net -985 ml   Net IO Since Admission: -985 mL [09/27/21 1403]  Pertinent Labs: CBC Latest Ref Rng & Units 09/27/2021 09/26/2021 09/26/2021  WBC 4.0 - 10.5 K/uL 11.0(H) - 18.1(H)  Hemoglobin 13.0 - 17.0 g/dL 12.7(L) 13.6  12.2(L)  Hematocrit 39.0 - 52.0 % 40.8 40.0 39.2  Platelets 150 - 400 K/uL 304 - 265    CMP Latest Ref Rng & Units 09/27/2021 09/26/2021 09/26/2021  Glucose 70 - 99 mg/dL 182(H) - 131(H)  BUN 8 - 23 mg/dL 17 - 13  Creatinine 0.61 - 1.24 mg/dL 1.41(H) - 1.34(H)  Sodium 135 - 145 mmol/L 133(L) 129(L) 129(L)  Potassium 3.5 - 5.1 mmol/L 3.5 3.8 3.8  Chloride 98 - 111 mmol/L 93(L) - 93(L)  CO2 22 - 32 mmol/L 27 - 24  Calcium 8.9 - 10.3 mg/dL 9.3 - 8.7(L)  Total Protein 6.5 - 8.1 g/dL - - 7.1  Total Bilirubin 0.3 - 1.2 mg/dL - - 1.3(H)  Alkaline Phos 38 - 126 U/L - - 93  AST 15 - 41 U/L - - 17  ALT 0 - 44 U/L - - 10    Imaging: DG CHEST PORT 1 VIEW  Result Date: 09/26/2021 CLINICAL DATA:  Shortness of breath.  History of COPD. EXAM: PORTABLE CHEST 1 VIEW COMPARISON:  08/17/2021 FINDINGS: An aortic stent graft and prosthetic aortic valve are again noted. The cardiac silhouette remains enlarged. Pulmonary vascular congestion is similar to the prior study. Underlying emphysema and scarring are noted. Small to moderate bilateral pleural effusions have mildly enlarged with atelectasis in the lung bases. No pneumothorax is identified.  IMPRESSION: 1. Mildly increased size of bilateral pleural effusions. 2. Cardiomegaly and pulmonary vascular congestion. Electronically Signed   By: Logan Bores M.D.   On: 09/26/2021 15:23    Assessment/Plan:   Active Problems:   Atrial fibrillation (HCC)   COPD (chronic obstructive pulmonary disease) (HCC)   Acute on chronic respiratory failure with hypercapnia (HCC)   Acute on chronic diastolic CHF (congestive heart failure) ([place])   Stage 2 skin ulcer of sacral region [facility])   Patient Summary: Sean Riley is a 71 y.o. with a pertinent PMH of chronic hypoxic respiratory failure secondary to COPD, atrial fibrillation, history of aortic valve replacement, chronic diastolic congestive heart failure, and recent history of enterococcal endocarditis, who presented  with worsening hypoxia and admitted for work-up for his acute on chronic hypoxic hypercapnic respiratory failure.   Acute on chronic hypoxic and hypercapnic respiratory failure HFpEF exacerbation Suspect patient's worsening hypoxia secondary to his CHF exacerbation and bilateral pleural effusions.    We will continue to diurese and keep patient out of RVR.  Unclear dry weight, last admission patient's discharge weight was 89.5 kg. -IV Lasix 40 twice daily -Daily weights, strict I's and O's -Daily BMP, K above 4 and mag above 2.  Potassium 20 mEq daily -If no improvement consider thoracentesis  Bilateral pleural effusions History of bilateral pleural effusions after his aortic surgery repair in 2018.  Thoracentesis occurred at this time however does not appear as though they were sent off for analysis.  Low suspicion that these are exudative in nature however if he has no improvement of his acute on chronic hypoxia with diuresis will plan for thoracentesis in a few days.  This may be chronic secondary to his surgery. -Continue to diurese -If no improvement will perform thoracentesis and send fluid for analysis  COPD PFTs in 6/19 showed moderate to severe obstruction. On home oxygen 4 to 5 L.  Low suspicion that his COPD is contributing to his worsening of his hypoxia.  We will continue his home medications and continue to monitor. -Albuterol inhaler twice daily -DuoNebs every 4 hours as needed -Butisol-glycopyrrolate-formoterol inhaler twice daily  A-fib with RVR On my exam today patient was in A-fib with RVR.  This improved after restarting flecainide and his metoprolol 12.5 twice daily.  -Continue flecainide 75 mg twice daily, metoprolol 12.5 mg twice daily, Eliquis 5 mg twice daily -Continue telemetry  CKD stage IIIA Baseline creatinine is between 1.3-1.5.  [place] labs creatinine within baseline.  We will continue to trend daily while we diurese. -Avoid nephrotoxic agents -Trend  BMP  History of enterococcal endocarditis Patient did have leukocytosis on admission however low suspicion that he has recurrence of his endocarditis or any other infectious etiology. -Continue to monitor  Diet: Heart Healthy IVF: None,None VTE: eliquis 5 mg Code: Full Family Update: Discussed plan with wife today   Dispo: Anticipated discharge to Home in 2 to 3 days pending further improvement of his respiratory status [facility] Resident PGY-2 Pager [phone] Please contact the on call pager after 5 pm and on weekends at [phone].

## 2021-09-28 DIAGNOSIS — Z515 Encounter for palliative care: Secondary | ICD-10-CM

## 2021-09-28 DIAGNOSIS — Z7189 Other specified counseling: Secondary | ICD-10-CM

## 2021-09-28 LAB — CBC WITH DIFFERENTIAL/PLATELET
Abs Immature Granulocytes: 0.16 10*3/uL — ABNORMAL HIGH (ref 0.00–0.07)
Basophils Absolute: 0 10*3/uL (ref 0.0–0.1)
Basophils Relative: 0 %
Eosinophils Absolute: 0.1 10*3/uL (ref 0.0–0.5)
Eosinophils Relative: 0 %
HCT: 34.7 % — ABNORMAL LOW (ref 39.0–52.0)
Hemoglobin: 11.2 g/dL — ABNORMAL LOW (ref 13.0–17.0)
Immature Granulocytes: 1 %
Lymphocytes Relative: 4 %
Lymphs Abs: 0.6 10*3/uL — ABNORMAL LOW (ref 0.7–4.0)
MCH: 26.5 pg (ref 26.0–34.0)
MCHC: 32.3 g/dL (ref 30.0–36.0)
MCV: 82 fL (ref 80.0–100.0)
Monocytes Absolute: 1 10*3/uL (ref 0.1–1.0)
Monocytes Relative: 6 %
Neutro Abs: 13.9 10*3/uL — ABNORMAL HIGH (ref 1.7–7.7)
Neutrophils Relative %: 89 %
Platelets: 308 10*3/uL (ref 150–400)
RBC: 4.23 MIL/uL (ref 4.22–5.81)
RDW: 17.6 % — ABNORMAL HIGH (ref 11.5–15.5)
WBC: 15.7 10*3/uL — ABNORMAL HIGH (ref 4.0–10.5)
nRBC: 0 % (ref 0.0–0.2)

## 2021-09-28 LAB — BASIC METABOLIC PANEL
Anion gap: 11 (ref 5–15)
BUN: 21 mg/dL (ref 8–23)
CO2: 29 mmol/L (ref 22–32)
Calcium: 8.7 mg/dL — ABNORMAL LOW (ref 8.9–10.3)
Chloride: 92 mmol/L — ABNORMAL LOW (ref 98–111)
Creatinine, Ser: 1.22 mg/dL (ref 0.61–1.24)
GFR, Estimated: >60 mL/min (ref >60)
Glucose, Bld: 105 mg/dL — ABNORMAL HIGH (ref 70–99)
Potassium: 3.2 mmol/L — ABNORMAL LOW (ref 3.5–5.1)
Sodium: 132 mmol/L — ABNORMAL LOW (ref 135–145)

## 2021-09-28 LAB — GLUCOSE, CAPILLARY: Glucose-Capillary: 126 mg/dL — ABNORMAL HIGH (ref 70–99)

## 2021-09-28 LAB — MAGNESIUM: Magnesium: 1.8 mg/dL (ref 1.7–2.4)

## 2021-09-28 MED ORDER — MAGNESIUM SULFATE 2 GM/50ML IV SOLN
2.0000 g | Freq: Once | INTRAVENOUS | Status: AC
  Administered 2021-09-28: 2 g via INTRAVENOUS
  Filled 2021-09-28: qty 50

## 2021-09-28 MED ORDER — POTASSIUM CHLORIDE CRYS ER 20 MEQ PO TBCR
40.0000 meq | EXTENDED_RELEASE_TABLET | Freq: Every day | ORAL | Status: DC
  Administered 2021-09-28 – 2021-10-03 (×6): 40 meq via ORAL
  Filled 2021-09-28 (×6): qty 2

## 2021-09-28 MED ORDER — FUROSEMIDE 10 MG/ML IJ SOLN
80.0000 mg | Freq: Two times a day (BID) | INTRAMUSCULAR | Status: DC
Start: 2021-09-28 — End: 2021-10-01
  Administered 2021-09-28 – 2021-10-01 (×6): 80 mg via INTRAVENOUS
  Filled 2021-09-28 (×6): qty 8

## 2021-09-28 MED ORDER — SODIUM CHLORIDE 0.9 % IV SOLN: INTRAVENOUS | Status: DC | PRN

## 2021-09-28 NOTE — Progress Notes (Signed)
RT called to place patient on Bipap due to increased WOB, SHOB during palliative care conversation. Patient previously refusing the bipap, but agreeable with encouragement from NP and family.

## 2021-09-28 NOTE — Progress Notes (Signed)
Patient requested RN to remove BiPap 10 minutes after initiation, ten minutes later patient called to be placed back on bipap. 15 minutes later, patient requested to be taken off BiPaP.  Patient spent about 30 mins total on BiPaP. He stated he's "trying to get used to it" but wanted to eat his dinner.  RR 20, 02 sats 92%.

## 2021-09-28 NOTE — Progress Notes (Addendum)
HD#2 SUBJECTIVE:  Patient Summary: Sean Riley is a 71 y.o. with a pertinent PMH of chronic hypoxic respiratory failure secondary to COPD, atrial fibrillation, history of aortic valve replacement, chronic diastolic congestive heart failure, and recent history of enterococcal endocarditis, who presented with worsening hypoxia and admitted for work-up for his acute on chronic hypoxic hypercapnic respiratory failure.   Overnight Events: None  Interim History: Patient evaluated at bedside this morning while he was eating breakfast.  He he expressed some frustration but did not want to go in depth as to why.  Over the course of her conversation he seemed to be less frustrated and was agreeable to our plan.  I did speak with his wife, Sean Riley, on the phone and she said that he is easily excited and frustrated, and that he has called her frustrated several times this morning.  I explained to her that after conversation he seemed to be feeling a little bit better and that we will continue with our current plan.  She did ask if we thought he would still need a thoracentesis and I assured her that it would not be today and that we would continue trying to pull the extra fluid off with medicines, however will reassess the need tomorrow.   I also spoke with his daughter Sean Riley who expressed concern that he has become more aggressive since admission in January.  Apparently Sean Riley has called his wife several times this morning yelling at her with inappropriate language demanding that she take him home today.  She and Sean Riley are both aware that taking him home would be unsafe and intended for him to stay in the hospital at this time.  I assured her that we would look through his medications and make sure that he is receiving all home medications as he does receive treatment for anxiety and depression outpatient.  She inquired about palliation as this was recommended by his PCP and we discussed the role of  palliative medicine in establishing goals of care and long-term plan.  She will be here before noon and will reiterate to her dad the importance of him staying.  OBJECTIVE:  Vital Signs: Vitals:   09/27/21 2358 09/28/21 0400 09/28/21 0744 09/28/21 0814  BP: 118/76 111/68 110/80   Pulse: 70 79 (!) 109   Resp: 17 (!) 21 (!) 30   Temp: 97.9 F (36.6 C) 98 F (36.7 C) 98.7 F (37.1 C)   TempSrc: Oral Oral Oral   SpO2: 96% 93% 91% 92%  Weight:  76.9 kg    Height:       Supplemental O2: Room Air SpO2: 92 % O2 Flow Rate (L/min): 10 L/min FiO2 (%): 50 %  Filed Weights   09/27/21 0210 09/28/21 0400  Weight: 77 kg 76.9 kg     Intake/Output Summary (Last 24 hours) at 09/28/2021 0919 Last data filed at 09/28/2021 9741 Gross per 24 hour  Intake 1680 ml  Output 1825 ml  Net -145 ml   Net IO Since Admission: -645 mL [09/28/21 0919]  Physical Exam: Constitutional: Chronically ill-appearing gentleman sitting at bedside eating breakfast Cardio: Irregular rhythm with tachycardia.  JVD to approximately 2 cm above the clavicle. Pulm: Diffuse rhonchi with decreased breath sounds in bilateral bases. MSK: Negative for extremity edema. Skin: Warm and dry. Neuro: No focal deficit noted. Psych: Normal mood and affect.  Patient Lines/Drains/Airways Status     Active Line/Drains/Airways     Name Placement date Placement time Site Days  Peripheral IV 09/26/21 18 G Left Forearm 09/26/21  --  Forearm  2   Incision (Closed) 03/24/17 Chest 03/24/17  0513  -- 1649   Incision (Closed) 03/24/17 Chest Right;Upper 03/24/17  --  -- 1649   Incision (Closed) 04/07/17 Abdomen Left;Lateral 04/07/17  1300  -- 1635   Incision (Closed) 03/30/18 Groin Right 03/30/18  1042  -- 1278   Pressure Injury 08/16/21 Buttocks Right;Left Stage 1 -  Intact skin with non-blanchable redness of a localized area usually over a bony prominence. Deep tissue injury with erythema and maroon coloration 08/16/21  0000  -- 43    Pressure Injury 09/27/21 Buttocks Right;Left;Mid Stage 2 -  Partial thickness loss of dermis presenting as a shallow open injury with a red, pink wound bed without slough. bilateral buttock has 2 scab about 1cm in size 09/27/21  0335  -- 1             ASSESSMENT/PLAN:  Assessment: Active Problems:   Atrial fibrillation (HCC)   COPD (chronic obstructive pulmonary disease) (HCC)   Acute on chronic respiratory failure with hypercapnia (HCC)   Acute on chronic diastolic CHF (congestive heart failure) (HCC)   Stage 2 skin ulcer of sacral region [facility])   Plan: Sean Riley is a 71 y.o. with a pertinent PMH of chronic hypoxic respiratory failure secondary to COPD, atrial fibrillation, history of aortic valve replacement, chronic diastolic congestive heart failure, and recent history of enterococcal endocarditis, who presented with worsening hypoxia and admitted for work-up for his acute on chronic hypoxic hypercapnic respiratory failure.    Acute on chronic hypoxic and hypercapnic respiratory failure HFpEF exacerbation Patient is on 10 L HFNC with appropriate oxygen saturations this morning.  His weight is down 0.1 kg from yesterday and net 145 mL.  On chart review it appears that when he was admitted in January his weight increased from 86.2 kg to 89.5.  On day of admission during this stay his weight was 77 kg and he is down to 76.9 kg.  Net -12.6 kg in a little over 1 month.  He states that his breathing is okay this morning however I do not see evidence that we have been able to wean his oxygen at all given these new increased requirements from home baseline of 4 to 5 L.  Physical exam revealed bilateral rhonchi with decrease bibasilar sounds and dullness to percussion.  He was in RVR up to 160 bpm during our exam but had not yet received his morning medications.  Our goal for today will be to continue with medical diuresis and maintain rate control with the hope that this will help his  oxygen needs.  Of note there was an increase in his white blood count from 11>15.7.  He has been receiving steroid treatment and is an acute inflammatory state however given pulmonary history and minimal clinical improvement will need to keep pneumonia on differential.  Fortunately he does not report worsening of his cough and has not required increased supplemental oxygen.  Due to acute exacerbation of his respiratory failure his home Xanax will remain as needed given concerns of altered respiratory drive. -Increase IV Lasix to 80 mg twice daily -Daily weights, strict I's and O's -Daily BMP, K above 4 and mag above 2.  Potassium 40 mEq daily. -If no improvement consider thoracentesis   Bilateral pleural effusions History of bilateral pleural effusions after his aortic surgery repair in 2018.  No improvement of oxygen requirements overnight. -Continue to diurese -  If no improvement will perform thoracentesis and send fluid for analysis   COPD Oxygen requirement of 10 L HFNC with appropriate oxygen saturations, not improved from yesterday.  Rhonchorous breath sounds with bibasilar decreased breath sounds and dullness to percussion. -Albuterol inhaler twice daily -DuoNebs every 4 hours as needed -Butisol-glycopyrrolate-formoterol inhaler twice daily   A-fib with RVR Irregular rhythm with tachycardia on physical exam concerning for persistent A-fib with RVR, heart rate up to 160 bpm during exam.  He had not yet received his morning medications. -Continue flecainide 75 mg twice daily, metoprolol 12.5 mg twice daily, Eliquis 5 mg twice daily -Continue telemetry   CKD stage IIIA AM labs revealed serum creatinine of 1.22 which is roughly his baseline. He is being diuresed at this time and has not shown signs of renal intolerance. He appears dry on exam today. -Avoid nephrotoxic agents -Trend BMP -Continue to diurese -Encourage PO intake   History of enterococcal endocarditis Increase in  leukocytosis 11>15.7. No murmur appreciated on exam. Infection is certainly on differential however felt that it is more likely to be a pulmonary source; he has also been receiving steroid-containing medications since being admitted. -Continue to monitor  History of depression/anxiety Patient has history of anxiety depression treated with Lexapro 10 mg daily and Xanax 1 mg every night.  He was continued on Lexapro on admission however Xanax is as needed only.  Given acute hypoxic and hypercapnic respiratory failure there are concerns with restarting a scheduled benzodiazepine because of altered respiratory drive.  His mood and behavior have been quite mean to both staff and family however in speaking to the family it seems that he has been progressively having worse behavior and mood over the last several months. -I will readdress his concerns at bedside in an attempt to understand what is leading to these progressively inappropriate behavior and reactive mechanisms.  At that time I will establish expectations for him as a patient and reiterate that verbally abusing our staff will not be tolerated. -Continue Lexapro 10 mg daily -Xanax 1 mg at bedtime as needed  Best Practice: Diet: Cardiac diet IVF: Fluids: IV push only, no IV fluids VTE: Eliquis 5 mg daily Code: Full AB: None Family Contact: [name], called and notified. DISPO: Anticipated discharge in 2-4 days to Home pending Medical stability.  Signature: Farrel Gordon, D.O.  Internal Medicine Resident, PGY-1 [facility]  Pager: [phone] 9:19 AM, 09/28/2021   Please contact the on call pager after 5 pm and on weekends at [phone].

## 2021-09-28 NOTE — Progress Notes (Signed)
°  Transition of Care [facility]) Screening Note   Patient Details  Name: [name] Date of Birth: 1951/01/20   Transition of Care [facility]) CM/SW Contact:    Bary Castilla, LCSW Phone Number: 09/28/2021, 2:32 PM    Transition of Care Department [facility]) has reviewed patient and no TOC needs have been identified at this time. We will continue to monitor patient advancement through interdisciplinary progression rounds. If new patient transition needs arise, please place a TOC consult.

## 2021-09-28 NOTE — Plan of Care (Signed)

## 2021-09-28 NOTE — Consult Note (Signed)
Palliative Medicine Inpatient Consult Note  Consulting Provider: Farrel Gordon, DO  Reason for consult:   [facility] Consult  Reason for Consult? GOC   HPI:  Per intake H&P --> Sean Riley is a 71 y.o. with a pertinent PMH of chronic hypoxic respiratory failure secondary to COPD, atrial fibrillation, history of aortic valve replacement, chronic diastolic congestive heart failure, and recent history of enterococcal endocarditis, who presented with worsening hypoxia and admitted for work-up for his acute on chronic hypoxic hypercapnic respiratory failure.  Palliative care has been asked to get involved in the setting of recurring rehospitalization's, multiple chronic comorbidities, and overall frailty to further discuss goals of care.  Clinical Assessment/Goals of Care:  *Please note that this is a verbal dictation therefore any spelling or grammatical errors are due to the "[place]" system interpretation.  I have reviewed medical records including EPIC notes, labs and imaging, received report from bedside RN, assessed the patient who shares that he has to use the restroom.  Gotten up to the side of the bed though felt quite short of breath.  Patient's RN, Jeanette Caprice made aware.  RT came to bedside for placement of BiPAP.   I met with Christeen Douglas, Lonell Grandchild (wife), [name] (daughter) to further discuss diagnosis prognosis, GOC, EOL wishes, disposition and options.   I introduced Palliative Medicine as specialized medical care for people living with serious illness. It focuses on providing relief from the symptoms and stress of a serious illness. The goal is to improve quality of life for both the patient and the family.  Medical History Review and Understanding:  Sean Riley's wife Sean Riley shares that his health has been declining since 2018 when he had an aortic dissection and required repair.  We reviewed that since then he is rotated  in and out of the hospital in the setting of his congestive heart failure, and episodes of sepsis.  Reviewed that COPD and diastolic heart failure are chronic progressive diseases that go in stages.  Reviewed when patients are at the end stage of their diseases despite comprehensive medication management in the home setting we continue to see a pattern of them traveling in and out of the hospital setting.  Social History:  Sean Riley lives in [place], [place].  He is married and has 2 children as well as 2 grandsons.  He used to work as an Hotel manager.  He is to get enjoyment out of going outside and working in the yard.  He is a man of faith and practices within the [employer] denomination.  Functional and Nutritional State:  Prior to hospitalization Infant the spouse, [name] helped him with all BADLs.  From an activity tolerance perspective [name] can walk no more than 10 feet without feeling profoundly short of breath.  Nutrition has been variable for [place] and his family shares he had little to no appetite after his last course of antibiotics though they do feel like his appetite is slowly coming back.  Palliative Symptoms:  Dyspnea on exertion this is consistent and notable with all activities.  Poor appetite - Family suspects in the setting of recent antibiotic use.  Anxiety - a chronic problem.  Advance Directives: A detailed discussion was had today regarding advanced directives.    Code Status: Concepts specific to code status, artifical feeding and hydration, continued IV antibiotics and rehospitalization was had.    Reviewed what cardiopulmonary resuscitation would look like in someone like Sean Riley with advanced age  and chronic disease processes.  I shared with him that that outcomes for folks like him who undergo CPR and intubation are typically not favorable.  I shared with him that often the person they were before the arrest is quite different than the person they are after  the arrest.  I asked that he take time and consideration in terms of what he would want in the long run.  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  I expressed that he is no longer child and with the disease burden he is currently enduring I would be quite worried about how he would do with a tracheostomy at this point in his life with already limited functionality.  Provided "Hard Choices for [employer]" booklet.   Discussion:  I was very open and honest with Sean Riley, his wife, and his daughter about his disease progression and processes.  I shared with them that I am concerned he is getting to the end stages of his disease.  We reviewed that if the cycle continues as it is now it would not be unreasonable to consider hospice in the future.  Patient's wife, Bertram Millard became quite emotional when this was mentioned therefore I did not proceed in conversation.  After a few moments [name] shared with me that she does not believe Loy is at a point where hospice would be a consideration.  I expressed that it may be of utility for Sean Riley to meet with the primary medical team in terms of reviewing each disease process which she has that is active and what we can expect the progression of that disease to look like.  Reviewed the plan to meet tomorrow.  [name] has some windows being installed in her home though she does share she will be available in the later afternoon hours.  Discussed the importance of continued conversation with family and their  medical providers regarding overall plan of care and treatment options, ensuring decisions are within the context of the patients values and GOCs.  Decision Maker: Sean Riley (spouse) - [phone]  SUMMARY OF RECOMMENDATIONS   Full code  Ongoing goals of care conversation, ideally palliative and primary medical team's will meet with family to further explain disease processes and set  expectations  Kimon would likely benefit from outpatient palliative support in the future if family is agreeable  Palliative medicine team will continue to follow during admission  Code Status/Advance Care Planning: FULL CODE   Symptom Management:  DOE: - Bipap as needed - Supplemental O2 - Diuresis per primary - Primary w/ consideration of thoracentesis - Breathing tx q4h prn  Anxiety: - Continue lexapro - Continue xanax  Weight Loss: - Nutrition consult  Palliative Prophylaxis:  Aspiration, Bowel Regimen, Delirium Protocol, Frequent Pain Assessment, Oral Care, Palliative Wound Care, and Turn Reposition  Additional Recommendations (Limitations, Scope, Preferences): Continue current scope of care  Psycho-social/Spiritual:  Desire for further Chaplaincy support: No Additional Recommendations: Education on diastolic heart failure and COPD   Prognosis: Worrisome in the setting of recurrent rehospitalization's, progress disease process.  CHF - (+) symptoms at rest, (+) aifb, (+) frequent hospitalizations COPD - (+) dyspnea at rest, (+) right heart failure, (+) O2 on RA < 88%, (+) weight loss (191lbs --> 169lbs)  Discharge Planning: Discharge plan uncertain at this time.  Vitals:   09/28/21 1342 09/28/21 1452  BP:    Pulse:  89  Resp:  (!) 24  Temp:    SpO2: 92% 94%    Intake/Output Summary (Last 24 hours) at 09/28/2021 1602 Last data filed at 09/28/2021 1306 Gross per 24 hour  Intake 1320 ml  Output 2000 ml  Net -680 ml   Last Weight  Most recent update: 09/28/2021  5:08 AM    Weight  76.9 kg (169 lb 8.5 oz)            Gen: Chronically ill-appearing elderly Caucasian male in moderate distress HEENT: Dry mucous membranes CV: Irregular rate and rhythm PULM: On 10 L nasal cannula transition to BiPAP at bedside ABD: Abdominal distention present EXT: bilateral lower extremity edema Neuro: Alert and oriented x3  PPS: 40-50%   This conversation/these  recommendations were discussed with patient primary care team, Dr. Marlou Sa  MDM High  Medical Decision Making:4 #/Complex Problems: 4                     Data Reviewed:  4             Management:4 (1-Straightforward, 2-Low, 3-Moderate, 4-High) ______________________________________________________ [place] Palliative Medicine Team Team Cell Phone: [phone] Please utilize secure chat with additional questions, if there is no response within 30 minutes please call the above phone number  Palliative Medicine Team providers are available by phone from 7am to 7pm daily and can be reached through the team cell phone.  Should this patient require assistance outside of these hours, please call the patient's attending physician.

## 2021-09-29 ENCOUNTER — Encounter (HOSPITAL_COMMUNITY): Payer: Self-pay | Admitting: Cardiology

## 2021-09-29 DIAGNOSIS — E440 Moderate protein-calorie malnutrition: Secondary | ICD-10-CM | POA: Insufficient documentation

## 2021-09-29 DIAGNOSIS — J90 Pleural effusion, not elsewhere classified: Secondary | ICD-10-CM | POA: Diagnosis not present

## 2021-09-29 DIAGNOSIS — I480 Paroxysmal atrial fibrillation: Secondary | ICD-10-CM | POA: Diagnosis not present

## 2021-09-29 DIAGNOSIS — J9622 Acute and chronic respiratory failure with hypercapnia: Secondary | ICD-10-CM | POA: Diagnosis not present

## 2021-09-29 DIAGNOSIS — I5033 Acute on chronic diastolic (congestive) heart failure: Secondary | ICD-10-CM | POA: Diagnosis not present

## 2021-09-29 LAB — CBC
HCT: 36.9 % — ABNORMAL LOW (ref 39.0–52.0)
Hemoglobin: 11.8 g/dL — ABNORMAL LOW (ref 13.0–17.0)
MCH: 26.6 pg (ref 26.0–34.0)
MCHC: 32 g/dL (ref 30.0–36.0)
MCV: 83.1 fL (ref 80.0–100.0)
Platelets: 280 10*3/uL (ref 150–400)
RBC: 4.44 MIL/uL (ref 4.22–5.81)
RDW: 17.8 % — ABNORMAL HIGH (ref 11.5–15.5)
WBC: 11.4 10*3/uL — ABNORMAL HIGH (ref 4.0–10.5)
nRBC: 0 % (ref 0.0–0.2)

## 2021-09-29 LAB — BASIC METABOLIC PANEL
Anion gap: 10 (ref 5–15)
BUN: 21 mg/dL (ref 8–23)
CO2: 31 mmol/L (ref 22–32)
Calcium: 8.7 mg/dL — ABNORMAL LOW (ref 8.9–10.3)
Chloride: 94 mmol/L — ABNORMAL LOW (ref 98–111)
Creatinine, Ser: 1.35 mg/dL — ABNORMAL HIGH (ref 0.61–1.24)
GFR, Estimated: 56 mL/min — ABNORMAL LOW (ref >60)
Glucose, Bld: 103 mg/dL — ABNORMAL HIGH (ref 70–99)
Potassium: 3.5 mmol/L (ref 3.5–5.1)
Sodium: 135 mmol/L (ref 135–145)

## 2021-09-29 LAB — GLUCOSE, CAPILLARY: Glucose-Capillary: 111 mg/dL — ABNORMAL HIGH (ref 70–99)

## 2021-09-29 LAB — MAGNESIUM: Magnesium: 2.2 mg/dL (ref 1.7–2.4)

## 2021-09-29 MED ORDER — POTASSIUM CHLORIDE CRYS ER 20 MEQ PO TBCR
40.0000 meq | EXTENDED_RELEASE_TABLET | Freq: Once | ORAL | Status: AC
  Administered 2021-09-29: 40 meq via ORAL
  Filled 2021-09-29: qty 2

## 2021-09-29 MED ORDER — ADULT MULTIVITAMIN W/MINERALS CH
1.0000 | ORAL_TABLET | Freq: Every day | ORAL | Status: DC
  Administered 2021-09-29 – 2021-10-03 (×5): 1 via ORAL
  Filled 2021-09-29 (×4): qty 1

## 2021-09-29 MED ORDER — ENSURE ENLIVE PO LIQD
237.0000 mL | Freq: Two times a day (BID) | ORAL | Status: DC
  Administered 2021-09-30 – 2021-10-03 (×6): 237 mL via ORAL

## 2021-09-29 NOTE — TOC Progression Note (Signed)
Transition of Care [facility]) - Progression Note    Patient Details  Name: Sean Riley MRN: [id number] Date of Birth: 1951-01-04  Transition of Care [facility]) CM/SW Contact  Zenon Mayo, RN Phone Number: 09/29/2021, 11:25 AM  Clinical Narrative:    from home with wife, a/c Resp Failure, CHF, afib, COPD, stage 2 sacral wound. Lasix IV 80mg  bid.  TOC will continue to follow for dc needs.         Expected Discharge Plan and Services                                                 Social Determinants of Health (SDOH) Interventions    Readmission Risk Interventions No flowsheet data found.

## 2021-09-29 NOTE — Progress Notes (Signed)
Pt refuses Bi-PAP for the night. Vitals are stable and pt not in any distress. RT will monitor the situation as needed.

## 2021-09-29 NOTE — Progress Notes (Signed)
HD#3 SUBJECTIVE:  Patient Summary: Sean Riley is a 71 y.o. with a pertinent PMH of chronic hypoxic respiratory failure secondary to COPD, atrial fibrillation, history of aortic valve replacement, chronic diastolic congestive heart failure, and recent history of enterococcal endocarditis, who presented with worsening hypoxia and admitted for acute on chronic hypoxic hypercapnic respiratory failure.   Overnight Events: Patient refused BiPAP  Interim History:Patient reports feeling well this morning and that he breathing has improved. He is not currently having chest discomfort or palpitations.  OBJECTIVE:  Vital Signs: Vitals:   09/28/21 1615 09/28/21 1919 09/29/21 0000 09/29/21 0419  BP:  (!) 143/81 108/70 (!) 147/74  Pulse:  (!) 109 72 (!) 108  Resp:  19 19   Temp:  98.8 F (37.1 C) 98.4 F (36.9 C) 98.4 F (36.9 C)  TempSrc:  Oral Oral Oral  SpO2: 95% 94% 96% 97%  Weight:    76.2 kg  Height:       Supplemental O2: 94% O2 sat on 6 L [place]   Filed Weights   09/27/21 0210 09/28/21 0400 09/29/21 0419  Weight: 77 kg 76.9 kg 76.2 kg     Intake/Output Summary (Last 24 hours) at 09/29/2021 0740 Last data filed at 09/29/2021 0425 Gross per 24 hour  Intake 778.33 ml  Output 2150 ml  Net -1371.67 ml   Net IO Since Admission: -2,016.67 mL [09/29/21 0740]  Physical Exam: Constitutional:Chronically-ill appearing gentleman resting comfortably in bed. No acute distress. Cardio:Intermittently irregular rhythm with regular rate. Pulm:Improvement of bibasilar decreased breath sounds with new crackles at R base. Patient weaned to 6L HFNC during exam with appropriate O2 saturations. Abdomen:Soft, nontender, nondistended. QIH:[id number] for extremity edema. Skin:Warm and dry. Neuro:No focal deficit noted. Psych:Normal mood and affect.  Patient Lines/Drains/Airways Status     Active Line/Drains/Airways     Name Placement date Placement time Site Days   Peripheral IV 09/26/21 18 G  Left Forearm 09/26/21  --  Forearm  3   External Urinary Catheter --  --  --  --   Incision (Closed) 03/24/17 Chest 03/24/17  0513  -- 1650   Incision (Closed) 03/24/17 Chest Right;Upper 03/24/17  --  -- 1650   Incision (Closed) 04/07/17 Abdomen Left;Lateral 04/07/17  1300  -- 1636   Incision (Closed) 03/30/18 Groin Right 03/30/18  1042  -- 1279   Pressure Injury 08/16/21 Buttocks Right;Left Stage 1 -  Intact skin with non-blanchable redness of a localized area usually over a bony prominence. Deep tissue injury with erythema and maroon coloration 08/16/21  0000  -- 44   Pressure Injury 09/27/21 Buttocks Right;Left;Mid Stage 2 -  Partial thickness loss of dermis presenting as a shallow open injury with a red, pink wound bed without slough. bilateral buttock has 2 scab about 1cm in size 09/27/21  0335  -- 2             ASSESSMENT/PLAN:  Assessment: Active Problems:   Atrial fibrillation (HCC)   COPD (chronic obstructive pulmonary disease) (HCC)   Acute on chronic respiratory failure with hypercapnia (HCC)   Acute on chronic diastolic CHF (congestive heart failure) (HCC)   Stage 2 skin ulcer of sacral region [facility])   Plan: Sean Riley is a 71 y.o. with a pertinent PMH of chronic hypoxic respiratory failure secondary to COPD, atrial fibrillation, history of aortic valve replacement, chronic diastolic congestive heart failure, and recent history of enterococcal endocarditis, who presented with worsening hypoxia and admitted for acute on chronic hypoxic hypercapnic  respiratory failure.    Acute on chronic hypoxic and hypercapnic respiratory failure HFpEF exacerbation Bilateral pleural effusions Patient is net -1.3L and -0.7 kg over last 24 hours. Leukocytosis improved to 11.4. During exam he was weaned to 6L [place] from 10L HFNC and maintained O2 saturations >90% with improvement in lung sounds. He is on 5L [place] at home due to COPD. Diuresis was increased to Lasix 80 mg IV BID yesterday. Of  note he underwent TEE 08/2021 which showed LF EF 55-60% with normal function and and no regional wall motion abnormalities. I discussed with the patient that our goal is not to completely wean from O2 as he is on 5L at baseline, however that our goal is to get him to 5L with safe oxygen saturations. I explained that given his improvements with increased diuresis, I feel that his acute on chronic respiratory failure is being driven by the amount of fluid that had built up around his lungs. I explained to him that at this time we will pursue medical management, but that if he shows signs of respiratory decline we will again discuss thoracentesis. We will aim to get him as dry as possible as at this time an acute HFpEF exacerbation seems to be the main driving factor in his acute on chronic respiratory failure. -Continue Lasix 80 mg IV BID -Wean HFNC with O2 goal 88-95%.  -IV Lasix to 80 mg twice daily -Daily weights, strict I's and O's -Daily BMP, K above 4 and mag above 2.  Potassium 40 mEq daily. -If no improvement consider thoracentesis and send for fluid analysis   COPD Patient saturating >90% when weaned to 6L HFNC during exam. Baseline is 5L [place]. As above, increased diuresis has improved clinical presentation including oxygen requirements and physical exam findings, as well as how he subjectively feels. Given improvements as detailed above, I feel that this episode is being driven by acute HFpEF exacerbation rather than progression of COPD. I am hopeful that with continued interventions as detailed above, he will return to baseline home O2 requirements. -Albuterol inhaler twice daily -DuoNebs every 4 hours as needed -Butisol-glycopyrrolate-formoterol inhaler twice daily   A-fib with RVR Intermittently in atrial fibrillation noted by cardiac monitor and mixed irregular-regular rhythm on exam. He is rate controlled at this time without palpitations or chest discomfort. Ideally with continued  improvement of HFpEF exacerbation we will see better control of atrial fibrillation. -Continue flecainide 75 mg twice daily, metoprolol 12.5 mg twice daily, Eliquis 5 mg twice daily -Continue telemetry   CKD stage IIIA Serum creatine stable at 1.35 (1.22 02/19). IV lasix was increased form 40 mg BID to 80 mg BID 02/19. He appears dry on exam today and is net -1.3L, -0.7 kg. -Avoid nephrotoxic agents -Trend BMP -Continue to diurese -Encourage PO intake   History of enterococcal endocarditis Leukocytosis improved to 11.4. No murmur appreciated on exam.  -Continue to monitor   History of depression/anxiety Patient has history of anxiety depression treated with Lexapro 10 mg daily and Xanax 1 mg every night.  He was continued on Lexapro on admission however Xanax is as needed only.  He did receive this last night. I spoke with him at bedside 02/19 about being kind to staff and patient as we try to help him feel better which he agreed to. We did not receive further pages regarding behavior and he agreed that he will continue to be pleasant when the conversation was revisited this morning. Will continue PRN status given acute  on chronic respiratory failure.  -Continue Lexapro 10 mg daily -Xanax 1 mg at bedtime as needed  Best Practice: Diet: Cardiac diet IVF: Fluids: IV push only, no IV fluids VTE: Eliquis 5 mg daily Code: Full AB: None Family Contact: [name], called and notified. DISPO: Anticipated discharge in 2-4 days to Home pending Medical stability.  Signature: Farrel Gordon, D.O.  Internal Medicine Resident, PGY-1 [facility]  Pager: [phone] 7:40 AM, 09/29/2021   Please contact the on call pager after 5 pm and on weekends at [phone].

## 2021-09-29 NOTE — Progress Notes (Cosign Needed)
Messaged by RN that patient is having chest pain, possibly related to shortness of breath. I evaluated him at bedside and he states that the pain is more of a pressure sensation and that he has this sensation at home sometimes, and it is relieved with time and closing his eyes and relaxing. He says that increasing his oxygen supplementation did help the discomfort somewhat.   Constitutional:Chronically ill appearing gentleman in no acute distress. Cardio:Regular rate and rhythm. Systolic murmur noted. Pulm:Bilateral upper rhonchi with crackles bilateral bases. Appropriate O2 saturations on 10L HFNC. IDC:VUDTHYHO for LE edema. Skin:Warm and dry. Neuro:Alert and oriented x3. No focal deficit noted. Psych:Pleasant mood and affect.  Assessment: EKG STAT obtained with no changes from morning EKG. He reports improvement in discomfort with increased oxygen supplementation and intentional relaxation.  Plan: CTM. Patient has been advised to have his nurse page medicine team ASAP if he has return of pain/discomfort for reevaluation.  Farrel Gordon Internal Medicine PGY-1

## 2021-09-29 NOTE — Progress Notes (Signed)
Initial Nutrition Assessment  DOCUMENTATION CODES:   Non-severe (moderate) malnutrition in context of chronic illness  INTERVENTION:   Recommend liberalizing pt diet to regular due to malnutrition. Messaged MD.  Encourage good PO intake. Multivitamin w/ minerals daily Ensure Enlive po BID, each supplement provides 350 kcal and 20 grams of protein.  NUTRITION DIAGNOSIS:   Moderate Malnutrition related to chronic illness (CHF, COPD) as evidenced by severe muscle depletion, moderate fat depletion.  GOAL:   Patient will meet greater than or equal to 90% of their needs  MONITOR:   PO intake, Supplement acceptance, Labs, Weight trends, Skin  REASON FOR ASSESSMENT:   Consult Assessment of nutrition requirement/status  ASSESSMENT:   71 y.o. male presented to the ED with increased shortness of breath. PMH include COPD, CHF, A. Fib, malnutrition, and GERD. Pt admitted with acute on chronic respiratory failure and CHF exacerbation.    Pt resting in bed, wife and daughter enter halfway through RD visit and provided information to interview as well.   Pt reports that his appetite is good currently and for the most part was good at home as well. Pt did report that for about 1 week PTA his appetite was not as good as before. Pt and wife reports that he loves steaks and has baked potatoes and a salad with it.  Per EMR, pt intake includes 100% for breakfast, lunch and dinner on 2/18 and 100% lunch and 25% dinner on 2/19.  Pt reports that he typically drinks 1 Boost per day at home. Discussed drinking Ensure while in the hospital, pt and wife agreeable.   Pt reports that his UBW is 190# and was steady there until about 1 month ago when he was hospitalized. Reports that since then he has just continued to lose weight. Per EMR, pt has had a 15% weight loss within 1 month, which is clinically significant for time frame. Although, due to chronic illness's unable to determine if weight loss is  related to fluid loss or actual dry weight loss. Pt reports no assistance with ambulating at home.   Medications reviewed and include: Lasix, Protonix, Potasium Chloride Labs reviewed.   UOP: 2150 mL x 24 hrs  NUTRITION - FOCUSED PHYSICAL EXAM:  Flowsheet Row Most Recent Value  Orbital Region Moderate depletion  Upper Arm Region Mild depletion  Thoracic and Lumbar Region Mild depletion  Buccal Region Moderate depletion  Temple Region No depletion  Clavicle Bone Region Mild depletion  Clavicle and Acromion Bone Region Mild depletion  Scapular Bone Region Moderate depletion  Dorsal Hand Moderate depletion  Patellar Region Severe depletion  Anterior Thigh Region Moderate depletion  Posterior Calf Region Severe depletion  Edema (RD Assessment) None  Hair Reviewed  Eyes Reviewed  Mouth Reviewed  Skin Reviewed  Nails Reviewed       Diet Order:   Diet Order             Diet Heart Room service appropriate? Yes; Fluid consistency: Thin  Diet effective now                   EDUCATION NEEDS:   No education needs have been identified at this time  Skin:  Skin Assessment: Skin Integrity Issues: Skin Integrity Issues:: Stage II Stage II: Buttocks  Last BM:  2/19  Height:   Ht Readings from Last 1 Encounters:  09/27/21 6' (1.829 m)    Weight:   Wt Readings from Last 1 Encounters:  09/29/21 76.2 kg  Ideal Body Weight:  80.9 kg  BMI:  Body mass index is 22.78 kg/m.  Estimated Nutritional Needs:   Kcal:  1916-6060  Protein:  115-130 grams  Fluid:  >/= 2.3 L    Laverle Pillard Louie Casa, RD, LDN Clinical Dietitian See [facility] for contact information.

## 2021-09-29 NOTE — Progress Notes (Signed)
Palliative Medicine Inpatient Follow Up Note   HPI: Sean Riley is a 71 y.o. with a pertinent PMH of chronic hypoxic respiratory failure secondary to COPD, atrial fibrillation, history of aortic valve replacement, chronic diastolic congestive heart failure, and recent history of enterococcal endocarditis, who presented with worsening hypoxia and admitted for work-up for his acute on chronic hypoxic hypercapnic respiratory failure.   Palliative care has been asked to get involved in the setting of recurring rehospitalization's, multiple chronic comorbidities, and overall frailty to further discuss goals of care.  Today's Discussion (09/29/2021):  *Please note that this is a verbal dictation therefore any spelling or grammatical errors are due to the "[place]" system interpretation.  Chart reviewed inclusive of vital signs, progress notes, laboratory results, and diagnostic images.   I met with [name] at bedside this afternoon. He shared that he was having some chest pressure. I alerted his RN who let the primary medical team know. EKG was done. Increased supplemental O2 to 7LPM HFNC.  Normand shares that he does have this episodes at home. I asked him if he was feeling anxious which he says he does feel from time to time and when that occurs his wife "gives him something".   I stated that I would be back when his daughter and wife arrived after getting their lunch. _________________________________________________ Addendum:  I met with Owais and his wife, [name] as well as his daughter, [name] at bedside.   We reviewed that they are all feeling relieved that the "fluid is coming off". I shared that this is good news overall, especially in the setting of him not needing a thoracentesis.   I again re-iterated my role to help Amaris navigate his chronic disease processes and have open and honest dialogue regarding where he is at in the process.  I shared some supplemental  education regarding COPD stage criteria and CHF classifications. Provided handouts on GOLD Criteria and NYHA classification. I shared that these are progressive diseases whereby symptom burden overtime will increase and former modalities of management may wane in effectiveness.  Reviewed that [name] thought about our conversation and that a lawyer will be coming to the hospital at [place] tomorrow to discuss their financial and living wills. I shared that this is good news as it means they understand the importance of having things in order moving into the future.  From the perspective of code status right now, Merrick would want every measure performed to save his life. I asked that he and his family at the very least discuss what would be an acceptable quality of life for him. Reviewed examples such as if he would want to be bed bound and reliant on machine for the rest of his life. Reviewed if in the ICU intubated, how long should that go on? Family will discuss some of the questions among themselves.  Discussed the role of outpatient palliative care to continue management of symptoms such as dyspnea and anxiety. Family will think about this.   Reviewed that it appears anxiety is a big driver of patients symptoms which he agrees with to some degree.   Questions and concerns addressed   Palliative Support Provided  Objective Assessment: Vital Signs Vitals:   09/29/21 0419 09/29/21 0819  BP: (!) 147/74   Pulse: (!) 108   Resp:    Temp: 98.4 F (36.9 C)   SpO2: 97% 94%    Intake/Output Summary (Last 24 hours) at 09/29/2021 1504 Last data filed at 09/29/2021  0852 Gross per 24 hour  Intake 958.33 ml  Output 1250 ml  Net -291.67 ml   Last Weight  Most recent update: 09/29/2021  4:25 AM    Weight  76.2 kg (167 lb 15.9 oz)            Gen: Chronically ill-appearing elderly Caucasian male in moderate distress HEENT: Dry mucous membranes CV: Irregular rate and rhythm PULM: On 7L nasal  cannula breathing is even ABD: Abdominal distention present EXT: bilateral lower extremity edema Neuro: Alert and oriented x3  SUMMARY OF RECOMMENDATIONS   Full code --> Kyley would like all measures to maintain life presently  Goal: To get Back home to prior level of function  Patients wife has a Chief Executive Officer coming in tomorrow at 11AM to complete Financial and Living wills   Naseem would likely benefit from outpatient palliative support - Family will think about this  Palliative medicine team will continue to follow incrementally   Code Status/Advance Care Planning: FULL CODE   Symptom Management:  DOE: - Bipap as needed - Supplemental O2 - Diuresis per primary - Primary w/ consideration of thoracentesis - Breathing tx q4h prn   Anxiety: - Continue lexapro - Continue xanax - Would likely benefit from low dose buspar 64m TID as this would have less of a side effect profile than xanax   Weight Loss: - Nutrition consult  Time Spent: 80  MDM - High ______________________________________________________________________________________ [place] Cell Phone: [phone] utilize secure chat with additional questions, if there is no response within 30 minutes please call the above phone number  Palliative Medicine Team providers are available by phone from 7am to 7pm daily and can be reached through the team cell phone.  Should this patient require assistance outside of these hours, please call the patient's attending physician.

## 2021-09-30 ENCOUNTER — Inpatient Hospital Stay (HOSPITAL_COMMUNITY): Payer: Medicare Other

## 2021-09-30 DIAGNOSIS — J90 Pleural effusion, not elsewhere classified: Secondary | ICD-10-CM

## 2021-09-30 DIAGNOSIS — I480 Paroxysmal atrial fibrillation: Secondary | ICD-10-CM | POA: Diagnosis not present

## 2021-09-30 DIAGNOSIS — I5033 Acute on chronic diastolic (congestive) heart failure: Secondary | ICD-10-CM | POA: Diagnosis not present

## 2021-09-30 DIAGNOSIS — J9622 Acute and chronic respiratory failure with hypercapnia: Secondary | ICD-10-CM | POA: Diagnosis not present

## 2021-09-30 LAB — BODY FLUID CELL COUNT WITH DIFFERENTIAL
Eos, Fluid: 2 %
Lymphs, Fluid: 68 %
Monocyte-Macrophage-Serous Fluid: 13 % — ABNORMAL LOW (ref 50–90)
Neutrophil Count, Fluid: 17 % (ref 0–25)
Total Nucleated Cell Count, Fluid: 784 cu mm (ref 0–1000)

## 2021-09-30 LAB — BASIC METABOLIC PANEL
Anion gap: 8 (ref 5–15)
BUN: 18 mg/dL (ref 8–23)
CO2: 31 mmol/L (ref 22–32)
Calcium: 8.6 mg/dL — ABNORMAL LOW (ref 8.9–10.3)
Chloride: 93 mmol/L — ABNORMAL LOW (ref 98–111)
Creatinine, Ser: 1.21 mg/dL (ref 0.61–1.24)
GFR, Estimated: >60 mL/min (ref >60)
Glucose, Bld: 108 mg/dL — ABNORMAL HIGH (ref 70–99)
Potassium: 3.7 mmol/L (ref 3.5–5.1)
Sodium: 132 mmol/L — ABNORMAL LOW (ref 135–145)

## 2021-09-30 LAB — CBC
HCT: 36 % — ABNORMAL LOW (ref 39.0–52.0)
Hemoglobin: 11.5 g/dL — ABNORMAL LOW (ref 13.0–17.0)
MCH: 26.4 pg (ref 26.0–34.0)
MCHC: 31.9 g/dL (ref 30.0–36.0)
MCV: 82.8 fL (ref 80.0–100.0)
Platelets: 293 10*3/uL (ref 150–400)
RBC: 4.35 MIL/uL (ref 4.22–5.81)
RDW: 17.5 % — ABNORMAL HIGH (ref 11.5–15.5)
WBC: 12.3 10*3/uL — ABNORMAL HIGH (ref 4.0–10.5)
nRBC: 0 % (ref 0.0–0.2)

## 2021-09-30 LAB — PROTEIN, PLEURAL OR PERITONEAL FLUID: Total protein, fluid: 3.8 g/dL

## 2021-09-30 LAB — LACTATE DEHYDROGENASE, PLEURAL OR PERITONEAL FLUID: LD, Fluid: 527 U/L — ABNORMAL HIGH (ref 3–23)

## 2021-09-30 LAB — PROTEIN, TOTAL: Total Protein: 7.4 g/dL (ref 6.5–8.1)

## 2021-09-30 LAB — GLUCOSE, PLEURAL OR PERITONEAL FLUID: Glucose, Fluid: 100 mg/dL

## 2021-09-30 LAB — ALBUMIN, PLEURAL OR PERITONEAL FLUID: Albumin, Fluid: 1.8 g/dL

## 2021-09-30 LAB — LACTATE DEHYDROGENASE: LDH: 180 U/L (ref 98–192)

## 2021-09-30 LAB — ALBUMIN: Albumin: 3.2 g/dL — ABNORMAL LOW (ref 3.5–5.0)

## 2021-09-30 NOTE — Consult Note (Signed)
[facility] Nurse Consult Note: Patient receiving care in [place]. Primary RN present at time of my assessment. Reason for Consult:sacral wound Wound type: NO wound present to sacrum, coccyx, or buttocks WOC nurse will not follow at this time.  Please re-consult the [place] team if needed.  Val Riles, RN, MSN, CWOCN, CNS-BC, pager [phone]

## 2021-09-30 NOTE — Progress Notes (Signed)
Incomplete re-expansion on R; repeat at [facility] to assure no enlargement.  Not unexpected given chronicity of effusion.

## 2021-09-30 NOTE — Consult Note (Signed)
Advanced Heart Failure Team Consult Note   Primary Physician: Colon Branch, MD PCP-Cardiologist:  None  Reason for Consultation: CHF   HPI:    Sean Riley is seen today for evaluation of CHF at the request of Dr. Marlou Sa.   71 y.o. with history of COPD, paroxysmal atrial fibrillation, type A aortic dissection, and chronic diastolic CHF.  Prior to 2018, patient reports no significant limitations.  In 8/18, he had a Type A aortic dissection.  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  The dissection extended down to the abdominal aorta.  He was a heavy smoker, quit in 8/18.  He developed paroxysmal atrial fibrillation, had DCCV in 10/19.  He is on Eliquis and flecainide now.  PFTs in 6/19 showed moderate-severe obstruction.     Ever since his operation in 2018, Mr Sean Riley has had significant exertional dyspnea and has been quite limited.     He had RHC in 11/20, showing normal filling pressures, preserved cardiac output, and mild primarily pulmonary venous hypertension.     PYP scan was not suggestive of cardiac amyloidosis.    He saw pulmonary, PFTs showed moderate to severe obstruction.  He is now on 4-5 L home oxygen and CPAP for OSA.     Echo in 5/21 showed EF 55%, basal inferior akinesis, mild RV dilation with normal function, PASP 46 mmHg, s/p Bentall, s/p bioprosthetic aortic valve with mean gradient 16 mmHg.    11/21 had DCCV to NSR.  Had been in atrial fibrillation for a few weeks.    He was admitted in 1/22 with CHF exacerbation and diuresed.  Echo in 1/22 showed EF 60-65%, moderate RV enlargement with mildly decreased RV systolic function, PASP 44, bioprosthetic AVR with mean gradient 18 mmHg.    Cardiomems implant 5/22.     Patient was admitted in 6/22 with GI bleeding.  EGD, colonoscopy, and capsule endoscopy were all unremarkable.  No source of bleeding identified.    Admitted 1/23 with hypoxia, requiring  BiPap, secondary to septic shock. Found to have enterococcal bacteremia.  AHF consulted and TEE did not show any vegetation. Echo on 08/16/2021 showed EF of 55 to 60%, moderately elevated pulmonary artery pressure, moderately enlarged right ventricle. He was diuresed and switched to torsemide. Hospitalization c/b AKI and paroxysmal atrial fibrillation. He was discharged on linezolid, torsemide and Toprol; weight 177 lbs.  Patient has continued to be weak at home, over the week prior to admission, he developed progressive exertional dyspnea.  No fever.  He has had productive cough and some wheezing.  He was admitted with acute on chronic hypoxemic respiratory failure.  Troponin was normal and BNP was only mildly elevated at 104.  Currently, he is on 8L HFNC.  He was thought to be volume overloaded and has been getting Lasix 80 mg IV bid.  I/Os have not been accurately recorded though weight appears stable. Today, he says he feels about the same.  Not short of breath at rest but dyspneic with any exertion.  Has chronic severe exercise limitation at baseline.     Review of Systems: All systems reviewed and negative except as per HPI.   Home Medications Prior to Admission medications   Medication Sig Start Date End Date Taking? Authorizing Provider  albuterol (PROAIR HFA) 108 (90 Base) MCG/ACT inhaler Inhale 2 puffs into the lungs every 4 (four) hours as needed for wheezing or shortness of breath. 06/09/21  Yes Mannam,  Praveen, MD  ALPRAZolam (XANAX) 1 MG tablet Take 1 mg by mouth at bedtime.   Yes [provider]  apixaban (ELIQUIS) 5 MG TABS tablet Take 1 tablet (5 mg total) by mouth 2 (two) times daily. 08/22/21  Yes Shelly Coss, MD  bisacodyl (DULCOLAX) 5 MG EC tablet Take 5 mg by mouth daily as needed for moderate constipation.   Yes [provider]  Budeson-Glycopyrrol-Formoterol (BREZTRI AEROSPHERE) 160-9-4.8 MCG/ACT AERO Inhale 2 puffs into the lungs in the morning and at  bedtime. 06/09/21  Yes Mannam, Praveen, MD  escitalopram (LEXAPRO) 10 MG tablet TAKE 1 TABLET DAILY Patient taking differently: Take 10 mg by mouth daily. 09/16/21  Yes Colon Branch, MD  esomeprazole (NEXIUM) 40 MG capsule Take 1 capsule (40 mg total) by mouth daily before breakfast. 01/05/17  Yes Kozlow, Donnamarie Poag, MD  flecainide (TAMBOCOR) 50 MG tablet Take 1.5 tablets (75 mg total) by mouth 2 (two) times daily. 08/22/21  Yes Shelly Coss, MD  fluticasone (FLONASE) 50 MCG/ACT nasal spray Place 1 spray into both nostrils 2 (two) times daily as needed for allergies. Use one spray in each nostril twice daily. 05/31/18  Yes Turner, Eber Hong, MD  hydroxypropyl methylcellulose / hypromellose (ISOPTO TEARS / GONIOVISC) 2.5 % ophthalmic solution Place 1-2 drops into both eyes 3 (three) times daily as needed (dry/irritated eyes).   Yes [provider]  ipratropium-albuterol (DUONEB) 0.5-2.5 (3) MG/3ML SOLN Take 3 mLs by nebulization every 4 (four) hours as needed. Patient taking differently: Take 3 mLs by nebulization every 4 (four) hours as needed (shortness of breath). 09/08/21  Yes Parrett, Tammy S, NP  JARDIANCE 10 MG TABS tablet TAKE 1 TABLET DAILY BEFORE BREAKFAST Patient taking differently: Take 10 mg by mouth daily. 04/23/21  Yes Larey Dresser, MD  magnesium hydroxide (MILK OF MAGNESIA) 400 MG/5ML suspension Take 7.5 mLs by mouth daily as needed (stomach cramps/constipation).   Yes [provider]  metoprolol tartrate (LOPRESSOR) 25 MG tablet Take 0.5 tablets (12.5 mg total) by mouth 2 (two) times daily. Please make yearly appt with Dr. Caryl Comes for January 2022 for future refills. Thank you 1st attempt 09/08/20  Yes Shelly Coss, MD  OXYGEN Inhale 5 L/min into the lungs continuous.   Yes [provider]  potassium chloride SA (KLOR-CON M) 20 MEQ tablet Take 40 mEq by mouth at bedtime.   Yes [provider]  rosuvastatin (CRESTOR) 5 MG tablet Take 5 mg by mouth at  bedtime.   Yes [provider]  torsemide (DEMADEX) 20 MG tablet Take 3 tablets (60 mg total) by mouth daily. 08/22/21  Yes Shelly Coss, MD  triamcinolone cream (KENALOG) 0.5 % Apply 1 application topically 3 (three) times daily. Patient taking differently: Apply 1 application topically 3 (three) times daily as needed (rash). 07/17/21  Yes Colon Branch, MD    Past Medical History: Past Medical History:  Diagnosis Date   Acute on chronic respiratory failure with hypercapnia (HCC)    Allergy    Arthritis    Ascending aortic dissection 03/23/2017   Atrial fibrillation (HCC)    Cataract    removed both eyes    CHF (congestive heart failure) (HCC)    GERD (gastroesophageal reflux disease)    HYPERLIPIDEMIA    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 LUTS    Hypertension    Microscopic hematuria    negative cystoscopy   NONSPECIFIC ABNORMAL ELECTROCARDIOGRAM    Pleural effusion on right  Pleural effusion, bilateral    S/P aortic dissection repair 03/24/2017   Biological Bentall aortic root replacement + resection and grafting of entire ascending aorta, transverse aortic arch and proximal descending thoracic aorta with elephant trunk distal anastomosis and debranching of aortic arch vessels   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   25 mm [facility] Ease bovine pericardial tissue valve and 28 mm Gelweave Valsalva aortic root graft with reimplantation of left main and right coronary arteries   Varicose veins     Past Surgical History: Past Surgical History:  Procedure Laterality Date   APPENDECTOMY     CARDIAC CATHETERIZATION      X3;Dr Caryl Comes, last 01/20/12: mild non-obstructive CAD, normal LV systolic function   CARDIOVERSION N/A 05/31/2018   Procedure: CARDIOVERSION;  Surgeon: Sueanne Margarita, MD;  Location: [facility] ENDOSCOPY;  Service: Cardiovascular;  Laterality: N/A;   CARDIOVERSION N/A 06/13/2020   Procedure: CARDIOVERSION;  Surgeon:  Larey Dresser, MD;  Location: [facility] ENDOSCOPY;  Service: Cardiovascular;  Laterality: N/A;   COLONOSCOPY     COLONOSCOPY N/A 01/27/2021   Procedure: COLONOSCOPY;  Surgeon: Gatha Mayer, MD;  Location: [facility] ENDOSCOPY;  Service: Endoscopy;  Laterality: N/A;   CYSTOSCOPY  1978   Dr Hartley Barefoot   ESOPHAGOGASTRODUODENOSCOPY N/A 01/27/2021   Procedure: ESOPHAGOGASTRODUODENOSCOPY (EGD);  Surgeon: Gatha Mayer, MD;  Location: [facility] ENDOSCOPY;  Service: Endoscopy;  Laterality: N/A;   EYE SURGERY     GIVENS CAPSULE STUDY N/A 01/27/2021   Procedure: GIVENS CAPSULE STUDY;  Surgeon: Gatha Mayer, MD;  Location: [place];  Service: Endoscopy;  Laterality: N/A;   IR THORACENTESIS ASP PLEURAL SPACE W/IMG GUIDE  11/25/2017   KNEE ARTHROSCOPY  2012   Dr Theda Sers   LUMBAR LAMINECTOMY  1987   Dr Lestine Box  2007   PERICARDIOCENTESIS N/A 04/07/2017   Procedure: PERICARDIOCENTESIS;  Surgeon: Sherren Mocha, MD;  Location: [place] CV LAB;  Service: Cardiovascular;  Laterality: N/A;   POLYPECTOMY     POLYPECTOMY  01/27/2021   Procedure: POLYPECTOMY;  Surgeon: Gatha Mayer, MD;  Location: [facility] ENDOSCOPY;  Service: Endoscopy;;   PRESSURE SENSOR/CARDIOMEMS N/A 12/26/2020   Procedure: PRESSURE SENSOR/CARDIOMEMS;  Surgeon: Larey Dresser, MD;  Location: [place] CV LAB;  Service: Cardiovascular;  Laterality: N/A;   REPAIR OF ACUTE ASCENDING THORACIC AORTIC DISSECTION N/A 03/23/2017   Procedure: REPAIR OF ACUTE ASCENDING THORACIC AORTIC DISSECTION.  Bentall procedure.  Aortic root repleacement with bioprosthetic valve.  Reimplantation of left and right coronary arteries.  Total resection of transverse aortic arch.  Elephant trunk distal anastomosis and debranching of arch vessels.;  Surgeon: Rexene Alberts, MD;  Location: [place];  Service: Vascular;  Laterality: N/A;   RIGHT HEART CATH N/A 06/15/2019   Procedure: RIGHT HEART CATH;  Surgeon: Larey Dresser, MD;  Location: [place] CV LAB;   Service: Cardiovascular;  Laterality: N/A;   ROTATOR CUFF REPAIR     SHOULDER ARTHROSCOPY  2011   Dr Theda Sers   SHOULDER ARTHROSCOPY Right 08/2015   SPINAL FUSION  1986   Dr Rolin Barry   TEE WITHOUT CARDIOVERSION N/A 03/23/2017   Procedure: TRANSESOPHAGEAL ECHOCARDIOGRAM (TEE);  Surgeon: Rexene Alberts, MD;  Location: [place];  Service: Open Heart Surgery;  Laterality: N/A;   TEE WITHOUT CARDIOVERSION N/A 08/21/2021   Procedure: TRANSESOPHAGEAL ECHOCARDIOGRAM (TEE);  Surgeon: Larey Dresser, MD;  Location: [facility] ENDOSCOPY;  Service: Cardiovascular;  Laterality: N/A;   THORACIC AORTIC ENDOVASCULAR STENT GRAFT  N/A 03/30/2018   Procedure: THORACIC AORTIC ENDOVASCULAR STENT GRAFT WITH INTRAVASCULAR ULTRASOUND;  Surgeon: Serafina Mitchell, MD;  Location: [facility] OR;  Service: Vascular;  Laterality: N/A;   TRACHEOSTOMY     age 5 for croup    Family History: Family History  Problem Relation Age of Onset   Hypertension Mother    Hypertension Father    Benign prostatic hyperplasia Father        S/P TURP   Heart attack Maternal Grandmother        MI in 59s   Breast cancer Maternal Grandmother    Arrhythmia Brother         X 2   Heart attack Maternal Aunt        MI in 68s   Stroke Neg Hx    Diabetes Neg Hx    Colon cancer Neg Hx    Colon polyps Neg Hx    Stomach cancer Neg Hx    Rectal cancer Neg Hx    Esophageal cancer Neg Hx    Prostate cancer Neg Hx    Other Neg Hx        gynecomastia    Social History: Social History   Socioeconomic History   Marital status: Married    Spouse name: Not on file   Number of children: 2   Years of education: Not on file   Highest education level: Not on file  Occupational History   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   Tobacco Use   Smoking status: Former    Packs/day: 2.00    Years: 45.00    Pack years: 90.00    Types: Cigarettes    Quit date: 03/22/2017    Years since quitting: 4.5   Smokeless tobacco: Never   Tobacco comments:    2 ppd ,  quit 03/2017  Vaping Use   Vaping Use: Never used  Substance and Sexual Activity   Alcohol use: Not Currently   Drug use: No   Sexual activity: Never  Other Topics Concern   Not on file  Social History Narrative   Lives w/ wife   Social Determinants of Health   Financial Resource Strain: Not on file  Food Insecurity: Not on file  Transportation Needs: Not on file  Physical Activity: Not on file  Stress: Not on file  Social Connections: Not on file    Allergies:  Allergies  Allergen Reactions   Simvastatin Other (See Comments)    Mental status changes   Celecoxib Other (See Comments)    GI UPSET AND INFLAMMATION   Codeine Other (See Comments)    GI UPSET AND INFLAMMATION   Nsaids Other (See Comments)    GI UPSET AND INFLAMMATION (can tolerate via IV)   Tape Other (See Comments)    Medical tape and Band-Aids PULL OFF THE SKIN; please use Coban wrap   Cephalexin Itching and Rash   Latex Rash    Objective:    Vital Signs:   Temp:  [97.9 F (36.6 C)-98 F (36.7 C)] 98 F (36.7 C) (02/21 0444) Pulse Rate:  [85] 85 (02/20 1918) Resp:  [20] 20 (02/21 0444) BP: (113-129)/(64-74) 129/74 (02/21 0444) SpO2:  [90 %-93 %] 90 % (02/21 0822) Weight:  [76.6 kg] 76.6 kg (02/21 0444) Last BM Date : 09/30/21  Weight change: Filed Weights   09/28/21 0400 09/29/21 0419 09/30/21 0444  Weight: 76.9 kg 76.2 kg 76.6 kg    Intake/Output:   Intake/Output Summary (Last 24 hours) at  09/30/2021 1145 Last data filed at 09/30/2021 0815 Gross per 24 hour  Intake 720 ml  Output 375 ml  Net 345 ml      Physical Exam    General:  Frail HEENT: normal Neck: supple. JVP 8 cm with HJR. Carotids 2+ bilat; no bruits. No lymphadenopathy or thyromegaly appreciated. Cor: PMI nondisplaced. Regular rate & rhythm. No rubs, gallops.  2/6 SEM RUSB.  Lungs: Decreased BS at bases, overall distant breath sounds, end expiratory wheezes.  Abdomen: soft, nontender, nondistended. No  hepatosplenomegaly. No bruits or masses. Good bowel sounds. Extremities: no cyanosis, clubbing, rash, edema Neuro: alert & orientedx3, cranial nerves grossly intact. moves all 4 extremities w/o difficulty. Affect pleasant   Telemetry   NSR 80s, personally reviewed  EKG    NSR, PVC, RBBB, LAFB (personally reviewed)  Labs   Basic Metabolic Panel: Recent Labs  Lab 09/26/21 1644 09/26/21 1758 09/27/21 0318 09/28/21 0415 09/29/21 0339 09/30/21 0057  NA 129* 129* 133* 132* 135 132*  K 3.8 3.8 3.5 3.2* 3.5 3.7  CL 93*  --  93* 92* 94* 93*  CO2 24  --  27 29 31 31   GLUCOSE 131*  --  182* 105* 103* 108*  BUN 13  --  17 21 21 18   CREATININE 1.34*  --  1.41* 1.22 1.35* 1.21  CALCIUM 8.7*  --  9.3 8.7* 8.7* 8.6*  MG  --   --  1.9 1.8 2.2  --   PHOS  --   --  3.4  --   --   --     Liver Function Tests: Recent Labs  Lab 09/26/21 1644  AST 17  ALT 10  ALKPHOS 93  BILITOT 1.3*  PROT 7.1  ALBUMIN 3.1*   No results for input(s): LIPASE, AMYLASE in the last 168 hours. No results for input(s): AMMONIA in the last 168 hours.  CBC: Recent Labs  Lab 09/26/21 1644 09/26/21 1758 09/27/21 0318 09/28/21 0415 09/29/21 0339 09/30/21 0057  WBC 18.1*  --  11.0* 15.7* 11.4* 12.3*  NEUTROABS 17.4*  --   --  13.9*  --   --   HGB 12.2* 13.6 12.7* 11.2* 11.8* 11.5*  HCT 39.2 40.0 40.8 34.7* 36.9* 36.0*  MCV 84.1  --  83.3 82.0 83.1 82.8  PLT 265  --  304 308 280 293    Cardiac Enzymes: No results for input(s): CKTOTAL, CKMB, CKMBINDEX, TROPONINI in the last 168 hours.  BNP: BNP (last 3 results) Recent Labs    08/16/21 0149 09/02/21 1204 09/26/21 1644  BNP 303.9* 26.8 104.2*    ProBNP (last 3 results) No results for input(s): PROBNP in the last 8760 hours.   CBG: Recent Labs  Lab 09/27/21 0556 09/28/21 0549 09/29/21 0604  GLUCAP 185* 126* 111*    Coagulation Studies: No results for input(s): LABPROT, INR in the last 72 hours.   Imaging   No results  found.   Medications:     Current Medications:  apixaban  5 mg Oral BID   empagliflozin  10 mg Oral QAC breakfast   escitalopram  10 mg Oral Daily   feeding supplement  237 mL Oral BID BM   flecainide  75 mg Oral BID   fluticasone furoate-vilanterol  1 puff Inhalation Daily   And   umeclidinium bromide  1 puff Inhalation Daily   furosemide  80 mg Intravenous BID   metoprolol tartrate  12.5 mg Oral BID   multivitamin with minerals  1  tablet Oral Daily   pantoprazole  40 mg Oral Daily   potassium chloride  40 mEq Oral Daily   rosuvastatin  5 mg Oral Daily    Infusions:  sodium chloride Stopped (09/28/21 1530)      Assessment/Plan   1. Acute on chronic hypoxemic respiratory failure: Patient has multifactorial dyspnea at baseline with COPD on home oxygen 5L + OSA with CPAP, diastolic CHF/RV failure, and significant deconditioning. He has had several days of IV Lasix but I/Os not well-recorded.  On exam, he does not look markedly volume overloaded to me, and he does have end expiratory wheezes and distant breath sounds with productive cough.  ?COPD exacerbation.  - Would give Lasix 80 mg IV bid for 1 more day.  - Consider treatment for COPD exacerbation with steroids, nebs, abx.  Will discuss with primary service.  - CXR PA/lateral to look for pleural effusion that could be drained. Decreased BS at lung bases.  - Consider RHC if dyspnea does not improve.  2. Recent Enterococcal bacteremia: 1/23.  Blood Cultures + enterococcus faecalis. Source uncertain. Treated presumptively for endocarditis. Has bioprosthetic AoV. Echo (1/23) w/ mod TR, mild MR. Prosthetic AoV not well visualized due to  poor acoustic windows. TEE (1/23) with no endocarditis.  No fever this admission.  - Completed linezolid course. 3. OSA: Continue CPAP.  4. ?Acute on chronic diastolic CHF with prominent RV dysfunction:  PYP study was not suggestive of ATTR amyloidosis and myeloma panel was negative.   Echo in 1/23  showed EF 55-60% RV moderately enlarged w/ moderately elevated RVSP 55.4 mmHg, RA severely dilated, mod TR, mild MR. Prosthetic AoV not well visualized, poor acoustic windows. Peak and mean gradients through the valve are 5 and 2 mm Hg respectively. Previous echo in May 2022, peak and mean gradients were 34 and 18 mm Hg respectively. Chronic NYHA IIIb symptoms.  On exam today, he may be mildly volume overloaded but not particularly impressive. As above, ?COPD playing a major role here.  - Continue Lasix 80 mg IV bid for today, need strict I/Os.  - If symptoms do not improve, would plan RHC to assess filling pressures.  - Continue metoprolol 12.5 mg bid. 5. 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 Roxy Manns. Echoes have shown stable bioprosthetic aortic valve.  Dr. Trula Slade has been following his residual dissected aorta.  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 6. Atrial fibrillation: Paroxysmal.  S/p DCCV in 11/21.  NSR today, earlier in hospitalization had runs of AF.  - Continue flecainide + metoprolol. - Continue Eliquis 5 mg bid.  7. COPD: Moderate to severe obstruction on PFTs, prior smoker.  He is on home oxygen. Followed by pulmonology.  End expiratory wheezing and distant BS.  - As above, consider treatment for COPD exacerbation.  8. Work with PT.   Length of Stay: 4  [name], MD  09/30/2021, 11:45 AM  Advanced Heart Failure Team Pager [phone] (M-F; 7a - 5p)  Please contact [facility] for night-coverage after hours (4p -7a ) and weekends on [email or web address]

## 2021-09-30 NOTE — Progress Notes (Signed)
Pt turned and pivoted to the chair. 1 person stand by assistance. Therapy to work with pt later today. Wife at bedside.

## 2021-09-30 NOTE — Progress Notes (Signed)
PT Cancellation Note  Patient Details Name: Sean Riley MRN: [id number] DOB: 1950-10-04   Cancelled Treatment:    Reason Eval/Treat Not Completed: Other (comment). Have attempted 3x this AM. Pt with MD, pt with lawyer, and now pt eating. Will continue attempts.   Shary Decamp [facility] 09/30/2021, 12:32 PM [place] Pager [phone] Office [phone]

## 2021-09-30 NOTE — Progress Notes (Signed)
PT Cancellation Note  Patient Details Name: Sean Riley MRN: [id number] DOB: [birth year]   Cancelled Treatment:    Reason Eval/Treat Not Completed: Patient at procedure or test/unavailable. Attempted for the fourth time today and pt having thoracentesis. Will continue attempts but will likely be tomorrow.   Shary Decamp [place] 09/30/2021, 2:03 PM [place] Pager [phone] Office [phone]

## 2021-09-30 NOTE — Care Management Important Message (Signed)
Important Message  Patient Details  Name: ELMOND POEHLMAN MRN: [id number] Date of Birth: [birth year]   Medicare Important Message Given:  Yes     Shelda Altes 09/30/2021, 8:45 AM

## 2021-09-30 NOTE — Procedures (Signed)
Thoracentesis  Procedure Note  Sean Riley  [id number]  Jul 29, 1951  Date:09/30/21  Time:2:48 PM   Provider Performing:Mirela Parsley   Procedure: Thoracentesis with imaging guidance [phone])  Indication(s) Pleural Effusion  Consent Risks of the procedure as well as the alternatives and risks of each were explained to the patient and/or caregiver.  Consent for the procedure was obtained and is signed in the bedside chart  Anesthesia Topical only with 1% lidocaine    Time Out Verified patient identification, verified procedure, site/side was marked, verified correct patient position, special equipment/implants available, medications/allergies/relevant history reviewed, required imaging and test results available.   Sterile Technique Maximal sterile technique including full sterile barrier drape, hand hygiene, sterile gown, sterile gloves, mask, hair covering, sterile ultrasound probe cover (if used).  Procedure Description Ultrasound was used to identify appropriate pleural anatomy for placement and overlying skin marked.  Area of drainage cleaned and draped in sterile fashion. Lidocaine was used to anesthetize the skin and subcutaneous tissue.  600 cc's of serosanguinous appearing fluid was drained from the right pleural space. Catheter then removed and bandaid applied to site.   Complications/Tolerance None; patient tolerated the procedure well. Chest X-ray is ordered to confirm no post-procedural complication.   EBL Minimal   Specimen(s) Pleural fluid

## 2021-09-30 NOTE — Progress Notes (Signed)
HD#4 SUBJECTIVE:  Patient Summary: Sean Riley is a 71 y.o. with a pertinent PMH of chronic hypoxic respiratory failure secondary to COPD, atrial fibrillation, history of aortic valve replacement, chronic diastolic congestive heart failure, and recent history of enterococcal endocarditis, who presented with worsening hypoxia and admitted for acute on chronic hypoxic hypercapnic respiratory failure.    Overnight Events: Patient reported chest pain during the afternoon yesterday. STAT EKG was obtained which was unchanged from prior. He had gradual improvement of the pain ("pressure") after his oxygen was increased. He reports episodes like this at home and it is alleviated with relaxing and closing his eyes over time. Physical exam was unchanged. No other pages for pain.  Interim History: Patient feels overall well this morning. He is in bedside recliner.  His cardiologist, Dr. Aundra Dubin, will come by and see him today.  OBJECTIVE:  Vital Signs: Vitals:   09/29/21 0419 09/29/21 0819 09/29/21 1918 09/30/21 0444  BP: (!) 147/74  113/64 129/74  Pulse: (!) 108  85   Resp:   20 20  Temp: 98.4 F (36.9 C)  97.9 F (36.6 C) 98 F (36.7 C)  TempSrc: Oral  Oral Oral  SpO2: 97% 94% 92% 93%  Weight: 76.2 kg   76.6 kg  Height:       Supplemental O2: HFNC SpO2: 93 % O2 Flow Rate (L/min): 5 L/min FiO2 (%): 50 %  Filed Weights   09/28/21 0400 09/29/21 0419 09/30/21 0444  Weight: 76.9 kg 76.2 kg 76.6 kg     Intake/Output Summary (Last 24 hours) at 09/30/2021 6387 Last data filed at 09/29/2021 2244 Gross per 24 hour  Intake 900 ml  Output 375 ml  Net 525 ml   Net IO Since Admission: -1,491.67 mL [09/30/21 0642]  Physical Exam: Constitutional: Chronically ill-appearing gentleman resting comfortably in bedside recliner.  No acute distress. Cardio: Regular rate and rhythm.  Systolic murmur noted, unchanged. Pulm: Bilateral crackles with decreased lung sounds left > right.  Oxygen  saturation appropriate on 6 L HFNC.  Bedside ultrasound revealed persistent bilateral pleural effusions, left greater than sign right. Abdomen: Soft, nontender, nondistended. MSK: Negative for extremity met. Skin: Warm and dry. Neuro: Alert and oriented x3.  No focal deficit noted. Psych: Normal mood and affect.  Patient Lines/Drains/Airways Status     Active Line/Drains/Airways     Name Placement date Placement time Site Days   Peripheral IV 09/26/21 18 G Left Forearm 09/26/21  --  Forearm  4   External Urinary Catheter --  --  --  --   Incision (Closed) 03/24/17 Chest 03/24/17  0513  -- 1651   Incision (Closed) 03/24/17 Chest Right;Upper 03/24/17  --  -- 1651   Incision (Closed) 04/07/17 Abdomen Left;Lateral 04/07/17  1300  -- 1637   Incision (Closed) 03/30/18 Groin Right 03/30/18  1042  -- 1280   Pressure Injury 08/16/21 Buttocks Right;Left Stage 1 -  Intact skin with non-blanchable redness of a localized area usually over a bony prominence. Deep tissue injury with erythema and maroon coloration 08/16/21  0000  -- 45   Pressure Injury 09/27/21 Buttocks Right;Left;Mid Stage 2 -  Partial thickness loss of dermis presenting as a shallow open injury with a red, pink wound bed without slough. bilateral buttock has 2 scab about 1cm in size 09/27/21  0335  -- 3             ASSESSMENT/PLAN:  Assessment: Active Problems:   Atrial fibrillation (HCC)   COPD (  chronic obstructive pulmonary disease) (HCC)   Acute on chronic respiratory failure with hypercapnia (HCC)   Acute on chronic diastolic CHF (congestive heart failure) (HCC)   Stage 2 skin ulcer of sacral region ([place])   Malnutrition of moderate degree   Plan: Sean Riley is a 71 y.o. with a pertinent PMH of chronic hypoxic respiratory failure secondary to COPD, atrial fibrillation, history of aortic valve replacement, chronic diastolic congestive heart failure, and recent history of enterococcal endocarditis, who presented  with worsening hypoxia and admitted for acute on chronic hypoxic hypercapnic respiratory failure.    Acute on chronic hypoxic and hypercapnic respiratory failure HFpEF exacerbation Bilateral pleural effusions Physical exam revealed bilateral crackles with improved but persistent decreased breath sounds, left >right.  Oxygen saturations appropriate on 6 L HFNC. He is net -1.49L for this admission however +0.5L yesterday with increase in weight of 0.4 kg. We may be reaching baseline/maximal benefit as far as diuresing him. Leukocytosis is stable with slight increase 12.3 (02/20 11.4).  Bedside ultrasound revealed persistent bilateral pleural effusions with left > right.  Our team revisited the conversation regarding performing thoracentesis and the patient requested to have it done with preference for today if possible. -F/u PT/OT recommendations.  -Ambulatory O2 saturations -Continue Lasix 80 mg IV BID -Wean HFNC as tolerated with O2 goal 88-95%.  -IV Lasix to 80 mg twice daily -Daily weights, strict I's and O's -Daily BMP, K above 4 and mag above 2.  Potassium 40 mEq daily. -If no improvement consider thoracentesis and send for fluid analysis   COPD 6L HFNC with appropriate oxygen saturations. -F/u PT/OT recommendations  -Ambulatory O2 saturations -Albuterol inhaler twice daily -DuoNebs every 4 hours as needed -Butisol-glycopyrrolate-formoterol inhaler twice daily   A-fib with RVR Patient was in sinus rhythm with rate control on exam.  He did have an episode of chest pressure during the afternoon yesterday that was improved with increasing oxygen supplementation and relaxation techniques.  This discomfort is not new and he reports experiencing at home intermittently.  Stat EKG was unchanged and telemetry review revealed no abnormalities during the episode. -Continue flecainide 75 mg twice daily, metoprolol 12.5 mg twice daily, Eliquis 5 mg twice daily -Continue telemetry   CKD stage  IIIA Serum creatine stable at 1.21 (1.35 02/20). Diuresing with IV Lasix 80 mg BID. -Avoid nephrotoxic agents -Trend BMP -Continue to diurese -Encourage PO intake   History of enterococcal endocarditis Leukocytosis stable though slightly increased at 12.3. No new murmur appreciated on exam.  -Continue to monitor   History of depression/anxiety Patient has history of anxiety depression treated with Lexapro 10 mg daily and Xanax 1 mg every night.  He was continued on Lexapro on admission however Xanax is as needed only.  He did receive this last night. Will continue PRN status given acute on chronic respiratory failure.  -Continue Lexapro 10 mg daily -Xanax 1 mg at bedtime as needed  Best Practice: Diet: Cardiac diet IVF: Fluids: IV push only, no IV fluids VTE: Eliquis 5 mg daily Code: Full AB: None Family Contact: [name], called and notified. DISPO: Anticipated discharge in 2-4 days to Home pending Medical stability.  Signature: Farrel Gordon, D.O.  Internal Medicine Resident, PGY-1 [facility]  Pager: [phone] 6:42 AM, 09/30/2021   Please contact the on call pager after 5 pm and on weekends at [phone].

## 2021-10-01 ENCOUNTER — Inpatient Hospital Stay (HOSPITAL_COMMUNITY): Payer: Medicare Other

## 2021-10-01 ENCOUNTER — Encounter: Payer: Self-pay | Admitting: Pulmonary Disease

## 2021-10-01 DIAGNOSIS — J90 Pleural effusion, not elsewhere classified: Secondary | ICD-10-CM | POA: Diagnosis not present

## 2021-10-01 DIAGNOSIS — J9621 Acute and chronic respiratory failure with hypoxia: Secondary | ICD-10-CM

## 2021-10-01 DIAGNOSIS — J431 Panlobular emphysema: Secondary | ICD-10-CM | POA: Diagnosis not present

## 2021-10-01 DIAGNOSIS — I5033 Acute on chronic diastolic (congestive) heart failure: Secondary | ICD-10-CM | POA: Diagnosis not present

## 2021-10-01 DIAGNOSIS — J9622 Acute and chronic respiratory failure with hypercapnia: Secondary | ICD-10-CM | POA: Diagnosis not present

## 2021-10-01 DIAGNOSIS — R0600 Dyspnea, unspecified: Secondary | ICD-10-CM

## 2021-10-01 LAB — BASIC METABOLIC PANEL
Anion gap: 10 (ref 5–15)
BUN: 24 mg/dL — ABNORMAL HIGH (ref 8–23)
CO2: 33 mmol/L — ABNORMAL HIGH (ref 22–32)
Calcium: 8.8 mg/dL — ABNORMAL LOW (ref 8.9–10.3)
Chloride: 89 mmol/L — ABNORMAL LOW (ref 98–111)
Creatinine, Ser: 1.37 mg/dL — ABNORMAL HIGH (ref 0.61–1.24)
GFR, Estimated: 55 mL/min — ABNORMAL LOW (ref >60)
Glucose, Bld: 132 mg/dL — ABNORMAL HIGH (ref 70–99)
Potassium: 3.6 mmol/L (ref 3.5–5.1)
Sodium: 132 mmol/L — ABNORMAL LOW (ref 135–145)

## 2021-10-01 LAB — MISC LABCORP TEST (SEND OUT): Labcorp test code: 5367

## 2021-10-01 LAB — CYTOLOGY - NON PAP

## 2021-10-01 LAB — CBC
HCT: 37.6 % — ABNORMAL LOW (ref 39.0–52.0)
Hemoglobin: 11.7 g/dL — ABNORMAL LOW (ref 13.0–17.0)
MCH: 26.1 pg (ref 26.0–34.0)
MCHC: 31.1 g/dL (ref 30.0–36.0)
MCV: 83.9 fL (ref 80.0–100.0)
Platelets: 284 10*3/uL (ref 150–400)
RBC: 4.48 MIL/uL (ref 4.22–5.81)
RDW: 17.5 % — ABNORMAL HIGH (ref 11.5–15.5)
WBC: 13.7 10*3/uL — ABNORMAL HIGH (ref 4.0–10.5)
nRBC: 0 % (ref 0.0–0.2)

## 2021-10-01 LAB — SODIUM, URINE, RANDOM: Sodium, Ur: 18 mmol/L

## 2021-10-01 LAB — GLUCOSE, CAPILLARY: Glucose-Capillary: 131 mg/dL — ABNORMAL HIGH (ref 70–99)

## 2021-10-01 MED ORDER — TORSEMIDE 20 MG PO TABS
60.0000 mg | ORAL_TABLET | Freq: Every day | ORAL | Status: DC
  Administered 2021-10-01 – 2021-10-03 (×3): 60 mg via ORAL
  Filled 2021-10-01 (×3): qty 3

## 2021-10-01 MED ORDER — ORAL CARE MOUTH RINSE
15.0000 mL | Freq: Two times a day (BID) | OROMUCOSAL | Status: DC
  Administered 2021-10-01 – 2021-10-03 (×5): 15 mL via OROMUCOSAL

## 2021-10-01 MED ORDER — IOHEXOL 300 MG/ML  SOLN
100.0000 mL | Freq: Once | INTRAMUSCULAR | Status: AC | PRN
  Administered 2021-10-01: 100 mL via INTRAVENOUS

## 2021-10-01 MED ORDER — POLYVINYL ALCOHOL 1.4 % OP SOLN
1.0000 [drp] | OPHTHALMIC | Status: DC | PRN
  Administered 2021-10-03: 1 [drp] via OPHTHALMIC
  Filled 2021-10-01: qty 15

## 2021-10-01 NOTE — TOC Progression Note (Addendum)
Transition of Care [facility]) - Progression Note    Patient Details  Name: Sean Riley MRN: [id number] Date of Birth: [birth year]  Transition of Care [facility]) CM/SW Contact  Zenon Mayo, RN Phone Number: 10/01/2021, 2:45 PM  Clinical Narrative:    Per [name] with [facility] patient is active with [facility] for [place], [place].  Per pt eval rec HHPT, NCM asked MD for orders.        Expected Discharge Plan and Services                                                 Social Determinants of Health (SDOH) Interventions    Readmission Risk Interventions No flowsheet data found.

## 2021-10-01 NOTE — Progress Notes (Addendum)
HD#5 SUBJECTIVE:  Patient Summary: Sean Riley is a 71 y.o. with a pertinent PMH of chronic hypoxic respiratory failure secondary to COPD, atrial fibrillation, history of aortic valve replacement, chronic diastolic congestive heart failure, and recent history of enterococcal endocarditis, who presented with worsening hypoxia and admitted for acute on chronic hypoxic hypercapnic respiratory failure.   Overnight Events: Patient underwent bedside thoracentesis with 600cc exudative fluid drained from R. Repeat imaging shows reaccumulation of fluid.  Interim History: Patient reports brief improvement of dyspnea following R thoracentesis yesterday. He is currently comfortable on 4L HFNC.   OBJECTIVE:  Vital Signs: Vitals:   09/30/21 0822 09/30/21 1600 09/30/21 1935 10/01/21 0238  BP:  107/64 109/62 110/79  Pulse:   96   Resp:   18 18  Temp:  98.7 F (37.1 C) (!) 97.4 F (36.3 C)   TempSrc:  Oral Oral   SpO2: 90%  92% 92%  Weight:    75.2 kg  Height:       Supplemental O2: HFNC SpO2: 92 % O2 Flow Rate (L/min): 6 L/min FiO2 (%): 50 %  Filed Weights   09/29/21 0419 09/30/21 0444 10/01/21 0238  Weight: 76.2 kg 76.6 kg 75.2 kg     Intake/Output Summary (Last 24 hours) at 10/01/2021 0708 Last data filed at 10/01/2021 0300 Gross per 24 hour  Intake 480 ml  Output 375 ml  Net 105 ml   Net IO Since Admission: -1,386.67 mL [10/01/21 0708]  Physical Exam: Constitutional:Chronically ill appearing gentleman resting comfortably in bedside recliner. Cardio:Regular rate and rhythm. Systolic murmur noted, unchanged. Pulm:Decreased bibasilar lung sounds with crackles. Appropriate oxygenation on 4L HFNC. DXA:JOINOMVE for extremity edema. Skin:Warm and dry. Neuro:Alert and oriented x3. No focal deficit noted. Psych:Normal mood and affect.  Patient Lines/Drains/Airways Status     Active Line/Drains/Airways     Name Placement date Placement time Site Days   Peripheral IV 09/26/21  18 G Left Forearm 09/26/21  --  Forearm  5   External Urinary Catheter --  --  --  --   Incision (Closed) 03/24/17 Chest 03/24/17  0513  -- 1652   Incision (Closed) 03/24/17 Chest Right;Upper 03/24/17  --  -- 1652   Incision (Closed) 04/07/17 Abdomen Left;Lateral 04/07/17  1300  -- 1638   Incision (Closed) 03/30/18 Groin Right 03/30/18  1042  -- 1281   Pressure Injury 08/16/21 Buttocks Right;Left Stage 1 -  Intact skin with non-blanchable redness of a localized area usually over a bony prominence. Deep tissue injury with erythema and maroon coloration 08/16/21  0000  -- 46   Pressure Injury 09/27/21 Buttocks Right;Left;Mid Stage 2 -  Partial thickness loss of dermis presenting as a shallow open injury with a red, pink wound bed without slough. bilateral buttock has 2 scab about 1cm in size 09/27/21  0335  -- 4             ASSESSMENT/PLAN:  Assessment: Active Problems:   Atrial fibrillation (HCC)   COPD (chronic obstructive pulmonary disease) (HCC)   Acute on chronic respiratory failure with hypercapnia (HCC)   Acute on chronic diastolic CHF (congestive heart failure) (HCC)   Stage 2 skin ulcer of sacral region (HCC)   Malnutrition of moderate degree   Pleural effusion   Plan: Sean Riley is a 71 y.o. with a pertinent PMH of chronic hypoxic respiratory failure secondary to COPD, atrial fibrillation, history of aortic valve replacement, chronic diastolic congestive heart failure, and recent history of enterococcal endocarditis, who  presented with worsening hypoxia and admitted for acute on chronic hypoxic hypercapnic respiratory failure.    Acute on chronic hypoxic and hypercapnic respiratory failure HFpEF exacerbation Bilateral pleural effusions Overall net -1.4L; -1.4 kg overnight. Patient underwent bedside thoracentesis on the R 02/21 and 600 mL serosanguinous fluid drained. Analysis reveals exudative process concerning for infection vs malignancy vs inflammation. Further, his  leukocytosis slightly increased further to 13.7. Most recent CXR showed tiny right apical pneumothorax, similar; basilar predominant pleuroparenchymal opacification bilaterally, unchanged from 09/30/2021 and better evaluated on CT chest 08/16/2021; a combination of rounded atelectasis and fibrothoraces is likely. Oxygen requirement has improved to 4L HFNC. Hyponatremia persists despite diuresis though cannot definitively state that it is not secondary to continued overload. -Urine Na, osmolality for continued hyponatremia -CT chest  -Consider pulmonology consult -F/u pleural gram stain and culture. Showing NG <24h. -F/u PT/OT recommendations.  -Ambulatory O2 saturations -Torsemide 60 mg daily -Wean HFNC as tolerated with O2 goal 88-95%.  -Daily weights, strict I's and O's -Daily BMP, K above 4 and mag above 2.  Potassium 40 mEq daily.   COPD Patient has shown improvements in oxygen supplementation needs over the course of this admission with aggressive diuresis. Physical exam findings, specifically lungs, has been notable for crackles, rhonchi, and decreased bibasilar breath sounds. He has had mild dry cough during our exam. With these findings our team has felt this acute respiratory failure episode to be more likely related to HFpEF than COPD, though baseline COPD certainly not helping his clinical picture. Currently on 4L HFNC with appropriate oxygen saturations. -F/u PT/OT recommendations             -Ambulatory O2 saturations -Albuterol inhaler twice daily -DuoNebs every 4 hours as needed -Butisol-glycopyrrolate-formoterol inhaler twice daily   A-fib with RVR Patient was in sinus rhythm with rate control on exam.  -Continue flecainide 75 mg twice daily, metoprolol 12.5 mg twice daily, Eliquis 5 mg twice daily -Continue telemetry   CKD stage IIIA Serum creatine stable at 1.37 (1.21 02/20). We have been diuresing with IV Lasix 80 mg BID. -Avoid nephrotoxic agents -Trend BMP -Stop IV  Lasix 80 mg BID 02/24 -Start torsemide 60 mg daily -Encourage PO intake   History of enterococcal endocarditis Leukocytosis increased to 13.7 from 12.4. He did undergo a thoracentesis yesterday so some of this may be reactive. He has been AF and HD stable. Murmur has not changed. -Continue to monitor   History of depression/anxiety Patient has history of anxiety depression treated with Lexapro 10 mg daily and Xanax 1 mg every night.  He was continued on Lexapro on admission and Xanax is as needed only.   -Continue Lexapro 10 mg daily -Xanax 1 mg at bedtime as needed  Best Practice: Diet: Cardiac diet IVF: Fluids: IV push only, no IV fluids VTE: Eliquis 5 mg daily Code: Full AB: None Family Contact: [name], called and notified. DISPO: Anticipated discharge in 2-4 days to Home pending Medical stability.  Signature: Farrel Gordon, D.O.  Internal Medicine Resident, PGY-1 [facility]  Pager: [phone] 7:08 AM, 10/01/2021   Please contact the on call pager after 5 pm and on weekends at [phone].

## 2021-10-01 NOTE — Telephone Encounter (Signed)
Dr Vaughan Browner, please see email from spouse   This is Sean Riley.   Sean Riley is in the hospital at [facility] and has been since last Friday.   Please have Dr Vaughan Browner look at everything and call me.     [phone]

## 2021-10-01 NOTE — Evaluation (Signed)
Physical Therapy Evaluation Patient Details Name: Sean Riley MRN: [id number] DOB: 10-07-1950 Today's Date: 10/01/2021  History of Present Illness  Pt adm 2/17 with acute on chronic hypoxic respiratory failure. PMH - copd, afib, AVR, chf, endocarditis  Clinical Impression  Pt admitted with above diagnosis. Pt was only able to stand to RW and desat to 85% with HR to 148 bpm therefore terminated activity due to pt fatigue. Pt with poor endurance as well as self limiting. Pt needed help at baseline per pt and wife has been assisting even PTA. Will follow acutely.   Pt currently with functional limitations due to balance and endurance deficits. Pt will benefit from skilled PT to increase their independence and safety with mobility to allow discharge to the venue listed below.          Recommendations for follow up therapy are one component of a multi-disciplinary discharge planning process, led by the attending physician.  Recommendations may be updated based on patient status, additional functional criteria and insurance authorization.  Follow Up Recommendations Home health PT    Assistance Recommended at Discharge Intermittent Supervision/Assistance  Patient can return home with the following  A little help with walking and/or transfers;A little help with bathing/dressing/bathroom;Help with stairs or ramp for entrance    Equipment Recommendations None recommended by PT  Recommendations for Other Services       Functional Status Assessment Patient has had a recent decline in their functional status and demonstrates the ability to make significant improvements in function in a reasonable and predictable amount of time.     Precautions / Restrictions Precautions Precautions: Fall Restrictions Weight Bearing Restrictions: No      Mobility  Bed Mobility               General bed mobility comments: NT in chair on arrival    Transfers Overall transfer level: Needs  assistance Equipment used: Rolling walker (2 wheels) Transfers: Sit to/from Stand Sit to Stand: Min assist           General transfer comment: Pt was able to stand to RW wtih min assist and cues for hand placement. Pt stood for about a minute and stated he cant take steps.  Sats on 4L to 88%.  Desat to 85% with standing and incr to 5L.  Pt HR also to 148 bpm with standing only. Pt wanted to sit back and down and stated that was all he could do today as he was "wiped out".    Ambulation/Gait                  Stairs            Wheelchair Mobility    Modified Rankin (Stroke Patients Only)       Balance Overall balance assessment: Needs assistance         Standing balance support: Bilateral upper extremity supported, During functional activity Standing balance-[name]: Poor Standing balance comment: relies on UE support on RW for balance                             Pertinent Vitals/Pain Pain Assessment Pain Assessment: No/denies pain    Home Living Family/patient expects to be discharged to:: Private residence Living Arrangements: Spouse/significant other Available Help at Discharge: Family;Available 24 hours/day Type of Home: House Home Access: Stairs to enter Entrance Stairs-Rails: Psychiatric nurse of Steps: 5   Home Layout: One level Home  Equipment: Conservation officer, nature (2 wheels);Cane - single point;Rollator (4 wheels);Shower seat;BSC/3in1 Additional Comments: Pt sleep in his lift recliner.  Also has an adjustable bed    Prior Function Prior Level of Function : Needs assist             Mobility Comments: uses cane outside of home ADLs Comments: sometimes needs assist with LE adls when fatigued but can do them alone if needed     Hand Dominance   Dominant Hand: Right    Extremity/Trunk Assessment   Upper Extremity Assessment Upper Extremity Assessment: Defer to OT evaluation    Lower Extremity  Assessment Lower Extremity Assessment: Generalized weakness    Cervical / Trunk Assessment Cervical / Trunk Assessment: Kyphotic  Communication   Communication: HOH  Cognition Arousal/Alertness: Awake/alert Behavior During Therapy: WFL for tasks assessed/performed Overall Cognitive Status: Within Functional Limits for tasks assessed                                 General Comments: Pt self limiting        General Comments General comments (skin integrity, edema, etc.): 100-148 bpm, 90% on 4L at rest.  85% with actiivty with 5LO2.BP at end of session 106/46    Exercises General Exercises - Lower Extremity Ankle Circles/Pumps: AROM, Both, 5 reps, Supine Long Arc Quad: AROM, Both, 10 reps, Seated   Assessment/Plan    PT Assessment Patient needs continued PT services  PT Problem List Decreased balance;Decreased activity tolerance;Decreased coordination;Decreased mobility;Decreased knowledge of use of DME;Decreased safety awareness;Decreased knowledge of precautions;Cardiopulmonary status limiting activity       PT Treatment Interventions DME instruction;Gait training;Functional mobility training;Stair training;Therapeutic activities;Therapeutic exercise;Balance training;Cognitive remediation;Patient/family education    PT Goals (Current goals can be found in the Care Plan section)  Acute Rehab PT Goals Patient Stated Goal: to go home PT Goal Formulation: With patient Time For Goal Achievement: 10/15/21 Potential to Achieve Goals: Good    Frequency Min 3X/week     Co-evaluation               AM-PAC PT "6 Clicks" Mobility  Outcome Measure Help needed turning from your back to your side while in a flat bed without using bedrails?: A Little Help needed moving from lying on your back to sitting on the side of a flat bed without using bedrails?: A Little Help needed moving to and from a bed to a chair (including a wheelchair)?: A Little Help needed  standing up from a chair using your arms (e.g., wheelchair or bedside chair)?: A Little Help needed to walk in hospital room?: A Lot Help needed climbing 3-5 steps with a railing? : A Lot 6 Click Score: 16    End of Session Equipment Utilized During Treatment: Gait belt;Oxygen Activity Tolerance: Patient limited by fatigue Patient left: in chair;with call bell/phone within reach;with chair alarm set Nurse Communication: Mobility status;Other (comment) (Pt aksing for eye drops) PT Visit Diagnosis: Muscle weakness (generalized) (M62.81);Unsteadiness on feet (R26.81)    Time: 1000-1025 PT Time Calculation (min) (ACUTE ONLY): 25 min   Charges:   PT Evaluation $PT Eval Moderate Complexity: 1 Mod PT Treatments $Therapeutic Activity: 8-22 mins        Marthe Dant M,PT Acute Rehab Services [phone] (pager)   Alvira Philips 10/01/2021, 1:43 PM

## 2021-10-01 NOTE — Progress Notes (Addendum)
Advanced Heart Failure Rounding Note  PCP-Cardiologist: None   Subjective:    Yesterday given 80 mg IV lasix. I/O not accurate. Weight down 3 pounds. Also had thoracentesis complicated by small PTX.   Feeling a little better.   Objective:   Weight Range: 75.2 kg Body mass index is 22.47 kg/m.   Vital Signs:   Temp:  [97.4 F (36.3 C)-98.7 F (37.1 C)] 97.4 F (36.3 C) (02/21 1935) Pulse Rate:  [84-96] 84 (02/22 0740) Resp:  [18-24] 24 (02/22 0740) BP: (107-131)/(62-79) 131/77 (02/22 0740) SpO2:  [90 %-94 %] 90 % (02/22 0740) Weight:  [75.2 kg] 75.2 kg (02/22 0238) Last BM Date : 10/01/21  Weight change: Filed Weights   09/29/21 0419 09/30/21 0444 10/01/21 0238  Weight: 76.2 kg 76.6 kg 75.2 kg    Intake/Output:   Intake/Output Summary (Last 24 hours) at 10/01/2021 0901 Last data filed at 10/01/2021 0742 Gross per 24 hour  Intake 480 ml  Output 825 ml  Net -345 ml      Physical Exam    General:  Sitting in chair.  No resp difficulty HEENT: Normal Neck: Supple. JVP 6-7 . Carotids 2+ bilat; no bruits. No lymphadenopathy or thyromegaly appreciated. Cor: PMI nondisplaced. Regular rate & rhythm. No rubs, gallops or murmurs. Lungs: Clear on 6 liters [place]  Abdomen: Soft, nontender, nondistended. No hepatosplenomegaly. No bruits or masses. Good bowel sounds. Extremities: No cyanosis, clubbing, rash, edema Neuro: Alert & orientedx3, cranial nerves grossly intact. moves all 4 extremities w/o difficulty. Affect pleasant   Telemetry   SR 80s   EKG    N/A  Labs    CBC Recent Labs    09/30/21 0057 10/01/21 0339  WBC 12.3* 13.7*  HGB 11.5* 11.7*  HCT 36.0* 37.6*  MCV 82.8 83.9  PLT 293 790   Basic Metabolic Panel Recent Labs    09/29/21 0339 09/30/21 0057 10/01/21 0339  NA 135 132* 132*  K 3.5 3.7 3.6  CL 94* 93* 89*  CO2 31 31 33*  GLUCOSE 103* 108* 132*  BUN 21 18 24*  CREATININE 1.35* 1.21 1.37*  CALCIUM 8.7* 8.6* 8.8*  MG 2.2  --   --     Liver Function Tests Recent Labs    09/30/21 1451  PROT 7.4  ALBUMIN 3.2*   No results for input(s): LIPASE, AMYLASE in the last 72 hours. Cardiac Enzymes No results for input(s): CKTOTAL, CKMB, CKMBINDEX, TROPONINI in the last 72 hours.  BNP: BNP (last 3 results) Recent Labs    08/16/21 0149 09/02/21 1204 09/26/21 1644  BNP 303.9* 26.8 104.2*    ProBNP (last 3 results) No results for input(s): PROBNP in the last 8760 hours.   D-Dimer No results for input(s): DDIMER in the last 72 hours. Hemoglobin A1C No results for input(s): HGBA1C in the last 72 hours. Fasting Lipid Panel No results for input(s): CHOL, HDL, LDLCALC, TRIG, CHOLHDL, LDLDIRECT in the last 72 hours. Thyroid Function Tests No results for input(s): TSH, T4TOTAL, T3FREE, THYROIDAB in the last 72 hours.  Invalid input(s): FREET3  Other results:   Imaging    DG CHEST PORT 1 VIEW  Result Date: 10/01/2021 CLINICAL DATA:  Shortness of breath, pleural effusions, pneumothorax. EXAM: PORTABLE CHEST 1 VIEW COMPARISON:  09/30/2021 and CT chest 08/16/2021. FINDINGS: Trachea is midline. Heart is enlarged, stable. Thoracic aortic endograft stent in place. Basilar predominant pleuroparenchymal opacification, unchanged from 09/30/2021 and better evaluated on CT chest 08/16/2021. Tiny right apical pneumothorax, similar.  IMPRESSION: 1. Tiny right apical pneumothorax, similar. 2. Basilar predominant pleuroparenchymal opacification bilaterally, unchanged from 09/30/2021 and better evaluated on CT chest 08/16/2021. A combination of rounded atelectasis and fibrothoraces is likely. Electronically Signed   By: Lorin Picket M.D.   On: 10/01/2021 08:35   DG Chest Port 1 View  Result Date: 09/30/2021 CLINICAL DATA:  Evaluate pneumothorax EXAM: PORTABLE CHEST 1 VIEW COMPARISON:  Film from earlier in the same day. FINDINGS: Small right basilar pneumothorax is noted laterally although now predominantly filled with fluid. No  apical component is noted at this time. Aortic stent graft is again seen and stable as are postsurgical changes. CardioMEMS device is seen. Stable left pleural effusion is noted. IMPRESSION: Reduction in right pneumothorax as the space is now being occupied by pleural fluid. The remainder of the exam is stable from the prior study. Electronically Signed   By: Inez Catalina M.D.   On: 09/30/2021 19:38   DG CHEST PORT 1 VIEW  Result Date: 09/30/2021 CLINICAL DATA:  Chest pain EXAM: PORTABLE CHEST 1 VIEW COMPARISON:  09/26/2021 FINDINGS: Aortic stent graft involving the ascending aorta, aortic arch, and descending thoracic aorta unchanged in position. Aortic valve replacement. Heart size upper normal. Mild vascular congestion. Negative for edema Decreased right pleural effusion post right thoracentesis. Small pneumothorax right lung base. Interval development of nodular density right upper lobe below surgical staples. This is new since the prior study and could represent pleural fluid or infiltrate. Follow-up recommended Mild to moderate left pleural effusion and left lower lobe atelectasis with interval improvement. IMPRESSION: Small pneumothorax right lung base post right pneumothorax. Improvement in left lower lobe airspace disease and left effusion Interval development of nodular density right upper lobe. Follow-up chest x-ray recommended. These results will be called to the ordering clinician or representative by the Radiologist Assistant, and communication documented in the PACS or [employer]. Electronically Signed   By: Franchot Gallo M.D.   On: 09/30/2021 15:23     Medications:     Scheduled Medications:  apixaban  5 mg Oral BID   empagliflozin  10 mg Oral QAC breakfast   escitalopram  10 mg Oral Daily   feeding supplement  237 mL Oral BID BM   flecainide  75 mg Oral BID   fluticasone furoate-vilanterol  1 puff Inhalation Daily   And   umeclidinium bromide  1 puff Inhalation Daily    furosemide  80 mg Intravenous BID   mouth rinse  15 mL Mouth Rinse BID   metoprolol tartrate  12.5 mg Oral BID   multivitamin with minerals  1 tablet Oral Daily   pantoprazole  40 mg Oral Daily   potassium chloride  40 mEq Oral Daily   rosuvastatin  5 mg Oral Daily    Infusions:  sodium chloride Stopped (09/28/21 1530)    PRN Medications: sodium chloride, acetaminophen **OR** acetaminophen, albuterol, ALPRAZolam, fluticasone, hydroxypropyl methylcellulose / hypromellose, ipratropium-albuterol, polyethylene glycol    Assessment/Plan   1. Acute on chronic hypoxemic respiratory failure: Patient has multifactorial dyspnea at baseline with COPD on home oxygen 5L + OSA with CPAP, diastolic CHF/RV failure, and significant deconditioning. He has had several days of IV Lasix but I/Os not well-recorded.  On exam, he does not look markedly volume overloaded to me, and he does have end expiratory wheezes and distant breath sounds with productive cough.  ?COPD exacerbation.  - S/P thoracentesis.  - Volume status improved after IV lasix.  - Consider treatment for COPD exacerbation with  steroids, nebs, abx.  Will discuss with primary service.  -  Consider RHC if dyspnea does not improve.  2. Recent Enterococcal bacteremia: 1/23.  Blood Cultures + enterococcus faecalis. Source uncertain. Treated presumptively for endocarditis. Has bioprosthetic AoV. Echo (1/23) w/ mod TR, mild MR. Prosthetic AoV not well visualized due to  poor acoustic windows. TEE (1/23) with no endocarditis.  No fever this admission.  - Completed linezolid course. 3. OSA: Continue CPAP.  4. ?Acute on chronic diastolic CHF with prominent RV dysfunction:  PYP study was not suggestive of ATTR amyloidosis and myeloma panel was negative.   Echo in 1/23 showed EF 55-60% RV moderately enlarged w/ moderately elevated RVSP 55.4 mmHg, RA severely dilated, mod TR, mild MR. Prosthetic AoV not well visualized, poor acoustic windows. Peak and mean  gradients through the valve are 5 and 2 mm Hg respectively. Previous echo in May 2022, peak and mean gradients were 34 and 18 mm Hg respectively. Chronic NYHA IIIb symptoms.  On exam today, he may be mildly volume overloaded but not particularly impressive. As above, ?COPD playing a major role here.  - Volume status improved. Stop IV lasix. Switch torsemide 60 mg daily.  - If symptoms do not improve, would plan RHC to assess filling pressures.  - Continue metoprolol 12.5 mg bid. 5. 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 Roxy Manns. Echoes have shown stable bioprosthetic aortic valve.  Dr. Trula Slade has been following his residual dissected aorta.  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 6. Atrial fibrillation: Paroxysmal.  S/p DCCV in 11/21.   - in SR 80s.   - Continue flecainide + metoprolol. - Continue Eliquis 5 mg bid.  7. COPD: Moderate to severe obstruction on PFTs, prior smoker.  He is on home oxygen. Followed by pulmonology.  End expiratory wheezing and distant BS.  - As above, consider treatment for COPD exacerbation.  8. Work with PT/OT .   Length of Stay: 5  Amy Clegg, NP  10/01/2021, 9:01 AM  Advanced Heart Failure Team Pager [phone] (M-F; 7a - 5p)  Please contact [facility] for night-coverage after hours (5p -7a ) and weekends on [email or web address]  Patient seen with NP, agree with the above note.   Still feels weak.  Weight down 3 lbs, I/Os still don't appear accurate.  He had right thoracentesis yesterday with resulting small PTX.  He is in NSR.   General: NAD Neck: No JVD, no thyromegaly or thyroid nodule.  Lungs: Distant BS CV: Nondisplaced PMI.  Heart regular S1/S2, no S3/S4, no murmur.  No peripheral edema.   Abdomen: Soft, nontender, no hepatosplenomegaly, no distention.  Skin: Intact without lesions or rashes.  Neurologic: Alert and oriented x 3.  Psych: Normal  affect. Extremities: No clubbing or cyanosis.  HEENT: Normal.   Volume status looks ok today, switch back to torsemide 60 mg daily.    Not wheezing today, just with distant BS.  Suspect COPD plays a significant role in his symptoms.   Small PTX s/p thoracentesis, should resolve on its own.   Needs to work with PT/OT.   [name] 10/01/2021 1:30 PM

## 2021-10-02 ENCOUNTER — Telehealth: Payer: Self-pay | Admitting: Pulmonary Disease

## 2021-10-02 ENCOUNTER — Ambulatory Visit: Payer: Medicare Other | Admitting: Pulmonary Disease

## 2021-10-02 ENCOUNTER — Encounter: Payer: Self-pay | Admitting: Pulmonary Disease

## 2021-10-02 DIAGNOSIS — J9622 Acute and chronic respiratory failure with hypercapnia: Secondary | ICD-10-CM | POA: Diagnosis not present

## 2021-10-02 DIAGNOSIS — I5033 Acute on chronic diastolic (congestive) heart failure: Secondary | ICD-10-CM | POA: Diagnosis not present

## 2021-10-02 DIAGNOSIS — J90 Pleural effusion, not elsewhere classified: Secondary | ICD-10-CM | POA: Diagnosis not present

## 2021-10-02 DIAGNOSIS — J431 Panlobular emphysema: Secondary | ICD-10-CM | POA: Diagnosis not present

## 2021-10-02 LAB — BASIC METABOLIC PANEL
Anion gap: 9 (ref 5–15)
BUN: 26 mg/dL — ABNORMAL HIGH (ref 8–23)
CO2: 32 mmol/L (ref 22–32)
Calcium: 8.7 mg/dL — ABNORMAL LOW (ref 8.9–10.3)
Chloride: 90 mmol/L — ABNORMAL LOW (ref 98–111)
Creatinine, Ser: 1.34 mg/dL — ABNORMAL HIGH (ref 0.61–1.24)
GFR, Estimated: 57 mL/min — ABNORMAL LOW (ref >60)
Glucose, Bld: 109 mg/dL — ABNORMAL HIGH (ref 70–99)
Potassium: 3.7 mmol/L (ref 3.5–5.1)
Sodium: 131 mmol/L — ABNORMAL LOW (ref 135–145)

## 2021-10-02 LAB — CBC
HCT: 37.4 % — ABNORMAL LOW (ref 39.0–52.0)
Hemoglobin: 12 g/dL — ABNORMAL LOW (ref 13.0–17.0)
MCH: 26.4 pg (ref 26.0–34.0)
MCHC: 32.1 g/dL (ref 30.0–36.0)
MCV: 82.4 fL (ref 80.0–100.0)
Platelets: 295 10*3/uL (ref 150–400)
RBC: 4.54 MIL/uL (ref 4.22–5.81)
RDW: 17.4 % — ABNORMAL HIGH (ref 11.5–15.5)
WBC: 13.7 10*3/uL — ABNORMAL HIGH (ref 4.0–10.5)
nRBC: 0 % (ref 0.0–0.2)

## 2021-10-02 LAB — GLUCOSE, CAPILLARY: Glucose-Capillary: 109 mg/dL — ABNORMAL HIGH (ref 70–99)

## 2021-10-02 LAB — OSMOLALITY, URINE: Osmolality, Ur: 305 mOsm/kg (ref 300–900)

## 2021-10-02 MED ORDER — DOXYCYCLINE HYCLATE 100 MG PO TABS
100.0000 mg | ORAL_TABLET | Freq: Two times a day (BID) | ORAL | Status: DC
Start: 2021-10-02 — End: 2021-10-03
  Administered 2021-10-02 – 2021-10-03 (×2): 100 mg via ORAL
  Filled 2021-10-02 (×2): qty 1

## 2021-10-02 MED ORDER — PREDNISONE 20 MG PO TABS
20.0000 mg | ORAL_TABLET | Freq: Every day | ORAL | Status: DC
  Administered 2021-10-03: 20 mg via ORAL
  Filled 2021-10-02: qty 1

## 2021-10-02 NOTE — Consult Note (Signed)
NAME:  Sean Riley, MRN:  [id number], DOB:  18-Mar-1951, LOS: 6 ADMISSION DATE:  09/26/2021, CONSULTATION DATE:  2/23 REFERRING MD:  Aundra Dubin, cardiology, CHIEF COMPLAINT:  dyspnea   History of Present Illness:  71 y/o male with multiple medical problems admitted with dyspnea for several weeks with associated orthopnea, wheezing and cough.  He was admitted by the IMTS service and has received diuresis.  He underwent a thoracentesis of the right chest on October 01, 2021 when 600 cc of fluid were removed.  Pleural fluid analysis showed total nucleated cell count of 784, lymphocytes 68%, monocytes 13%, neutrophils 13%, eosinophils 2%, LDH 527, protein 3.8, albumin 1.8.  Serum albumin 3.2.  Pulmonary and critical care medicine were asked to consult regarding the patient's pleural effusion analysis.  The patient tells me that he is feeling "not worse".  On further prompting he says that he may be feeling better.  He says that he is not very short of breath and he feels that he is at his baseline.  At home he uses up to 5 L nasal cannula at rest, up to 8 L with exertion.  His wife provides much of the history as the patient is quite reticent.  She says that since admission his mucus production has picked up and he is now coughing up green to brown sputum.  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  Rounded atelectasis in the bases of both lungs.  Interim History / Subjective:  As above  Objective   Blood pressure 102/62, pulse 99, temperature 97.9 F (36.6 C), temperature source Oral, resp. rate (!) 27, height 6' (1.829 m), weight 75.2 kg, SpO2 96 %.        Intake/Output Summary (Last 24 hours) at 10/02/2021 1154 Last data filed at 10/02/2021 0802 Gross per 24 hour  Intake 843 ml  Output 1700 ml  Net -857 ml   Filed Weights   09/30/21 0444 10/01/21 0238 10/02/21 0614  Weight: 76.6 kg 75.2 kg 75.2 kg    Examination:  General:  Chronically ill appearing, resting comfortably in bed HENT: NCAT OP clear PULM: Diminished in bases, wheezing upper lobes, normal effort CV: RRR, loud systolic murmur RUSB GI: BS+, soft, nontender MSK: normal bulk and tone Neuro: awake, alert, no distress, MAEW    Resolved Hospital Problem list     Assessment & Plan:  Chronic pleural effusions, transudative based on albumin gradient greater than 1.2 g/dL Rounded atelectasis secondary to trapped lung and chronic effusions COPD with acute exacerbation Systolic heart failure with acute exacerbation Chronic respiratory failure with hypoxemia, currently at baseline Trapped lung  Discussion: Though the LDH and protein values of his pleural fluid are suggestive of an exudative fluid, this is in fact a transudative process based on the patient's albumin gradient of 1.4 g/dL.  Further, this fits with the patient's clinical history as he has chronic pleural effusions which appear to be slightly increased in size in the setting of  a CHF exacerbation.  In addition to all this I believe he is having a mild COPD exacerbation as he is wheezing and coughing up green mucus.  Plan: Follow-up in pulmonary clinic in 2 to 4 weeks Continue home oxygen levels 5 L at rest, 8 L with exertion Diuresis per heart failure service Would suggest treating  for a COPD exacerbation with 20 mg of prednisone daily x5 days, doxycycline 100 mg p.o. twice daily x5 days Would hold off on further thoracentesis considering the chronicity  Pulmonary and critical care medicine will sign off, call if questions  Best Practice (right click and "Reselect all SmartList Selections" daily)   Per primary  Labs   CBC: Recent Labs  Lab 09/26/21 1644 09/26/21 1758 09/28/21 0415 09/29/21 0339 09/30/21 0057 10/01/21 0339 10/02/21 0408  WBC 18.1*   < > 15.7* 11.4* 12.3* 13.7* 13.7*  NEUTROABS 17.4*  --  13.9*  --   --   --   --   HGB 12.2*   < > 11.2* 11.8* 11.5* 11.7* 12.0*  HCT 39.2   < > 34.7* 36.9* 36.0* 37.6* 37.4*  MCV 84.1   < > 82.0 83.1 82.8 83.9 82.4  PLT 265   < > 308 280 293 284 295   < > = values in this interval not displayed.    Basic Metabolic Panel: Recent Labs  Lab 09/27/21 0318 09/28/21 0415 09/29/21 0339 09/30/21 0057 10/01/21 0339 10/02/21 0408  NA 133* 132* 135 132* 132* 131*  K 3.5 3.2* 3.5 3.7 3.6 3.7  CL 93* 92* 94* 93* 89* 90*  CO2 27 29 31 31  33* 32  GLUCOSE 182* 105* 103* 108* 132* 109*  BUN 17 21 21 18  24* 26*  CREATININE 1.41* 1.22 1.35* 1.21 1.37* 1.34*  CALCIUM 9.3 8.7* 8.7* 8.6* 8.8* 8.7*  MG 1.9 1.8 2.2  --   --   --   PHOS 3.4  --   --   --   --   --    GFR: Estimated Creatinine Clearance: 53.8 mL/min (A) (by C-G formula based on SCr of 1.34 mg/dL (H)). Recent Labs  Lab 09/29/21 0339 09/30/21 0057 10/01/21 0339 10/02/21 0408  WBC 11.4* 12.3* 13.7* 13.7*    Liver Function Tests: Recent Labs  Lab 09/26/21 1644 09/30/21 1451  AST 17  --   ALT 10  --   ALKPHOS 93  --   BILITOT 1.3*  --   PROT 7.1 7.4  ALBUMIN 3.1* 3.2*   No results for input(s): LIPASE, AMYLASE in the last 168 hours. No results for input(s): AMMONIA in the last 168 hours.  ABG    Component Value Date/Time   PHART 7.381 09/26/2021 1758   PCO2ART 47.0 09/26/2021 1758   PO2ART 83 09/26/2021 1758   HCO3 27.9 09/26/2021  1758   TCO2 29 09/26/2021 1758   ACIDBASEDEF 2.0 03/25/2017 0742   O2SAT 96 09/26/2021 1758     Coagulation Profile: No results for input(s): INR, PROTIME in the last 168 hours.  Cardiac Enzymes: No results for input(s): CKTOTAL, CKMB, CKMBINDEX, TROPONINI in the last 168 hours.  HbA1C: Hgb A1c MFr Bld  Date/Time Value Ref Range Status  08/16/2021 01:49 AM 5.5 4.8 - 5.6 % Final    Comment:    (NOTE) Pre diabetes:          5.7%-6.4%  Diabetes:              >6.4%  Glycemic control for   <  7.0% adults with diabetes   01/25/2021 02:45 AM 4.4 (L) 4.8 - 5.6 % Final    Comment:    (NOTE)         Prediabetes: 5.7 - 6.4         Diabetes: >6.4         Glycemic control for adults with diabetes: <7.0     CBG: Recent Labs  Lab 09/27/21 0556 09/28/21 0549 09/29/21 0604 10/01/21 1103 10/02/21 0613  GLUCAP 185* 126* 111* 131* 109*    Review of Systems:   Gen: Denies fever, chills, weight change, fatigue, night sweats HEENT: Denies blurred vision, double vision, hearing loss, tinnitus, sinus congestion, rhinorrhea, sore throat, neck stiffness, dysphagia PULM: per HPI CV: per HPI GI: Denies abdominal pain, nausea, vomiting, diarrhea, hematochezia, melena, constipation, change in bowel habits GU: Denies dysuria, hematuria, polyuria, oliguria, urethral discharge Endocrine: Denies hot or cold intolerance, polyuria, polyphagia or appetite change Derm: Denies rash, dry skin, scaling or peeling skin change Heme: Denies easy bruising, bleeding, bleeding gums Neuro: Denies headache, numbness, weakness, slurred speech, loss of memory or consciousness   Past Medical History:  He,  has a past medical history of Acute on chronic respiratory failure with hypercapnia ([place]), Allergy, Arthritis, Ascending aortic dissection (03/23/2017), Atrial fibrillation ([place]), Cataract, CHF (congestive heart failure) ([place]), GERD (gastroesophageal reflux disease), HYPERLIPIDEMIA, HYPERPLASIA PROSTATE UNS  W/O UR OBST & OTH LUTS, Hypertension, Microscopic hematuria, NONSPECIFIC ABNORMAL ELECTROCARDIOGRAM, Pleural effusion on right, Pleural effusion, bilateral, S/P aortic dissection repair (03/24/2017), 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   Surgical History:   Past Surgical History:  Procedure Laterality Date   APPENDECTOMY     CARDIAC CATHETERIZATION      X3;Dr Caryl Comes, last 01/20/12: mild non-obstructive CAD, normal LV systolic function   CARDIOVERSION N/A 05/31/2018   Procedure: CARDIOVERSION;  Surgeon: Sueanne Margarita, MD;  Location: [facility] ENDOSCOPY;  Service: Cardiovascular;  Laterality: N/A;   CARDIOVERSION N/A 06/13/2020   Procedure: CARDIOVERSION;  Surgeon: Larey Dresser, MD;  Location: [facility] ENDOSCOPY;  Service: Cardiovascular;  Laterality: N/A;   COLONOSCOPY     COLONOSCOPY N/A 01/27/2021   Procedure: COLONOSCOPY;  Surgeon: Gatha Mayer, MD;  Location: [facility] ENDOSCOPY;  Service: Endoscopy;  Laterality: N/A;   CYSTOSCOPY  1978   Dr Hartley Barefoot   ESOPHAGOGASTRODUODENOSCOPY N/A 01/27/2021   Procedure: ESOPHAGOGASTRODUODENOSCOPY (EGD);  Surgeon: Gatha Mayer, MD;  Location: [facility] ENDOSCOPY;  Service: Endoscopy;  Laterality: N/A;   EYE SURGERY     GIVENS CAPSULE STUDY N/A 01/27/2021   Procedure: GIVENS CAPSULE STUDY;  Surgeon: Gatha Mayer, MD;  Location: [place];  Service: Endoscopy;  Laterality: N/A;   IR THORACENTESIS ASP PLEURAL SPACE W/IMG GUIDE  11/25/2017   KNEE ARTHROSCOPY  2012   Dr Theda Sers   LUMBAR LAMINECTOMY  1987   Dr Lestine Box  2007   PERICARDIOCENTESIS N/A 04/07/2017   Procedure: PERICARDIOCENTESIS;  Surgeon: Sherren Mocha, MD;  Location: [place] CV LAB;  Service: Cardiovascular;  Laterality: N/A;   POLYPECTOMY     POLYPECTOMY  01/27/2021   Procedure: POLYPECTOMY;  Surgeon: Gatha Mayer, MD;  Location: [facility] ENDOSCOPY;  Service: Endoscopy;;   PRESSURE SENSOR/CARDIOMEMS N/A 12/26/2020   Procedure:  PRESSURE SENSOR/CARDIOMEMS;  Surgeon: Larey Dresser, MD;  Location: [place] CV LAB;  Service: Cardiovascular;  Laterality: N/A;   REPAIR OF ACUTE ASCENDING THORACIC AORTIC DISSECTION N/A 03/23/2017   Procedure: REPAIR OF ACUTE ASCENDING THORACIC AORTIC DISSECTION.  Bentall procedure.  Aortic root repleacement with bioprosthetic valve.  Reimplantation of left and right coronary arteries.  Total resection of transverse aortic arch.  Elephant trunk distal anastomosis and debranching of arch vessels.;  Surgeon: Rexene Alberts, MD;  Location: [place];  Service: Vascular;  Laterality: N/A;   RIGHT HEART CATH N/A 06/15/2019   Procedure: RIGHT HEART CATH;  Surgeon: Larey Dresser, MD;  Location: [place] CV LAB;  Service: Cardiovascular;  Laterality: N/A;   ROTATOR CUFF REPAIR     SHOULDER ARTHROSCOPY  2011   Dr Theda Sers   SHOULDER ARTHROSCOPY Right 08/2015   SPINAL FUSION  1986   Dr Rolin Barry   TEE WITHOUT CARDIOVERSION N/A 03/23/2017   Procedure: TRANSESOPHAGEAL ECHOCARDIOGRAM (TEE);  Surgeon: Rexene Alberts, MD;  Location: [place];  Service: Open Heart Surgery;  Laterality: N/A;   TEE WITHOUT CARDIOVERSION N/A 08/21/2021   Procedure: TRANSESOPHAGEAL ECHOCARDIOGRAM (TEE);  Surgeon: Larey Dresser, MD;  Location: [facility] ENDOSCOPY;  Service: Cardiovascular;  Laterality: N/A;   THORACIC AORTIC ENDOVASCULAR STENT GRAFT N/A 03/30/2018   Procedure: THORACIC AORTIC ENDOVASCULAR STENT GRAFT WITH INTRAVASCULAR ULTRASOUND;  Surgeon: Serafina Mitchell, MD;  Location: [facility] OR;  Service: Vascular;  Laterality: N/A;   TRACHEOSTOMY     age 64 for croup     Social History:   reports that he quit smoking about 4 years ago. His smoking use included cigarettes. He has a 90.00 pack-year smoking history. He has never used smokeless tobacco. He reports that he does not currently use alcohol. He reports that he does not use drugs.   Family History:  His family history includes Arrhythmia in his brother; Benign prostatic  hyperplasia in his father; Breast cancer in his maternal grandmother; Heart attack in his maternal aunt and maternal grandmother; Hypertension in his father and mother. There is no history of Stroke, Diabetes, Colon cancer, Colon polyps, Stomach cancer, Rectal cancer, Esophageal cancer, Prostate cancer, or Other.   Allergies Allergies  Allergen Reactions   Simvastatin Other (See Comments)    Mental status changes   Celecoxib Other (See Comments)    GI UPSET AND INFLAMMATION   Codeine Other (See Comments)    GI UPSET AND INFLAMMATION   Nsaids Other (See Comments)    GI UPSET AND INFLAMMATION (can tolerate via IV)   Tape Other (See Comments)    Medical tape and Band-Aids PULL OFF THE SKIN; please use Coban wrap   Cephalexin Itching and Rash   Latex Rash     Home Medications  Prior to Admission medications   Medication Sig Start Date End Date Taking? Authorizing Provider  albuterol (PROAIR HFA) 108 (90 Base) MCG/ACT inhaler Inhale 2 puffs into the lungs every 4 (four) hours as needed for wheezing or shortness of breath. 06/09/21  Yes Mannam, Praveen, MD  ALPRAZolam (XANAX) 1 MG tablet Take 1 mg by mouth at bedtime.   Yes [provider]  apixaban (ELIQUIS) 5 MG TABS tablet Take 1 tablet (5 mg total) by mouth 2 (two) times daily. 08/22/21  Yes Shelly Coss, MD  bisacodyl (DULCOLAX) 5 MG EC tablet Take 5 mg by mouth daily as needed for moderate constipation.   Yes [provider]  Budeson-Glycopyrrol-Formoterol (BREZTRI AEROSPHERE) 160-9-4.8 MCG/ACT AERO Inhale 2 puffs into the lungs in the morning and at bedtime. 06/09/21  Yes Mannam, Praveen, MD  escitalopram (LEXAPRO) 10 MG tablet TAKE 1 TABLET DAILY Patient taking differently: Take 10 mg by mouth daily. 09/16/21  Yes Paz, [place]  E, MD  esomeprazole (NEXIUM) 40 MG capsule Take 1 capsule (40 mg total) by mouth daily before breakfast. 01/05/17  Yes Kozlow, Donnamarie Poag, MD  flecainide (TAMBOCOR) 50 MG tablet Take 1.5 tablets (75 mg  total) by mouth 2 (two) times daily. 08/22/21  Yes Shelly Coss, MD  fluticasone (FLONASE) 50 MCG/ACT nasal spray Place 1 spray into both nostrils 2 (two) times daily as needed for allergies. Use one spray in each nostril twice daily. 05/31/18  Yes Turner, Eber Hong, MD  hydroxypropyl methylcellulose / hypromellose (ISOPTO TEARS / GONIOVISC) 2.5 % ophthalmic solution Place 1-2 drops into both eyes 3 (three) times daily as needed (dry/irritated eyes).   Yes [provider]  ipratropium-albuterol (DUONEB) 0.5-2.5 (3) MG/3ML SOLN Take 3 mLs by nebulization every 4 (four) hours as needed. Patient taking differently: Take 3 mLs by nebulization every 4 (four) hours as needed (shortness of breath). 09/08/21  Yes Parrett, Tammy S, NP  JARDIANCE 10 MG TABS tablet TAKE 1 TABLET DAILY BEFORE BREAKFAST Patient taking differently: Take 10 mg by mouth daily. 04/23/21  Yes Larey Dresser, MD  magnesium hydroxide (MILK OF MAGNESIA) 400 MG/5ML suspension Take 7.5 mLs by mouth daily as needed (stomach cramps/constipation).   Yes [provider]  metoprolol tartrate (LOPRESSOR) 25 MG tablet Take 0.5 tablets (12.5 mg total) by mouth 2 (two) times daily. Please make yearly appt with Dr. Caryl Comes for January 2022 for future refills. Thank you 1st attempt 09/08/20  Yes Shelly Coss, MD  OXYGEN Inhale 5 L/min into the lungs continuous.   Yes [provider]  potassium chloride SA (KLOR-CON M) 20 MEQ tablet Take 40 mEq by mouth at bedtime.   Yes [provider]  rosuvastatin (CRESTOR) 5 MG tablet Take 5 mg by mouth at bedtime.   Yes [provider]  torsemide (DEMADEX) 20 MG tablet Take 3 tablets (60 mg total) by mouth daily. 08/22/21  Yes Shelly Coss, MD  triamcinolone cream (KENALOG) 0.5 % Apply 1 application topically 3 (three) times daily. Patient taking differently: Apply 1 application topically 3 (three) times daily as needed (rash). 07/17/21  Yes Colon Branch, MD      Critical care time: n/a     Roselie Awkward, MD [place] [facility] Pager: [phone] Cell: [phone] After 7:00 pm call [place]  [phone]

## 2021-10-02 NOTE — Evaluation (Signed)
Occupational Therapy Evaluation Patient Details Name: Sean Riley MRN: [id number] DOB: 12-Aug-1950 Today's Date: 10/02/2021   History of Present Illness Pt adm 2/17 with acute on chronic hypoxic respiratory failure. PMH - copd, afib, AVR, chf, endocarditis   Clinical Impression   Chart reviewed, pt greeted in bed agreeable to OT evaluation. Pt is alert and oriented x4. Pt reports PTA he was MOD I- independent in all ADL/IADL. Wife typically completes cooking/cleaning/laundry. Pt presents with generalized weakness, deficits in strength and endurance affecting optimal ADL completion. Pt is performing all ADL below PLOF. Pt is provided additional education re: energy conservation, safe ADL completion, fall prevention. Pt is left in bedside chair, NAD, all needs met. Recommend HHOT to address functional deficits, OT will continue to follow acutely.     Recommendations for follow up therapy are one component of a multi-disciplinary discharge planning process, led by the attending physician.  Recommendations may be updated based on patient status, additional functional criteria and insurance authorization.   Follow Up Recommendations  Home health OT    Assistance Recommended at Discharge Intermittent Supervision/Assistance  Patient can return home with the following A lot of help with walking and/or transfers;A little help with bathing/dressing/bathroom;Assistance with cooking/housework;Assist for transportation;Help with stairs or ramp for entrance    Functional Status Assessment  Patient has had a recent decline in their functional status and demonstrates the ability to make significant improvements in function in a reasonable and predictable amount of time.  Equipment Recommendations  Other (comment);None recommended by OT (pt has recommended equipment)    Recommendations for Other Services       Precautions / Restrictions Precautions Precautions: Fall Restrictions Weight Bearing  Restrictions: No      Mobility Bed Mobility Overal bed mobility: Needs Assistance Bed Mobility: Supine to Sit     Supine to sit: Modified independent (Device/Increase time), HOB elevated     General bed mobility comments: use of bed rails, increased time    Transfers Overall transfer level: Needs assistance Equipment used: Rolling walker (2 wheels) Transfers: Sit to/from Stand Sit to Stand: Min guard                  Balance Overall balance assessment: Needs assistance Sitting-balance support: Feet supported Sitting balance-[name]: Good     Standing balance support: Bilateral upper extremity supported, During functional activity Standing balance-[name]: Fair                             ADL either performed or assessed with clinical judgement   ADL Overall ADL's : Needs assistance/impaired Eating/Feeding: Set up;Sitting Eating/Feeding Details (indicate cue type and reason): drinking in chair Grooming: Wash/dry hands;Wash/dry face;Oral care;Set up;Sitting Grooming Details (indicate cue type and reason): at edge of bed and in bedside chair Upper Body Bathing: Set up;Sitting Upper Body Bathing Details (indicate cue type and reason): energy conservation techniques Lower Body Bathing: Moderate assistance;Sitting/lateral leans   Upper Body Dressing : Set up;Sitting Upper Body Dressing Details (indicate cue type and reason): at edge of bed Lower Body Dressing: Maximal assistance   Toilet Transfer: Min guard;Stand-pivot;Rolling walker (2 wheels) Toilet Transfer Details (indicate cue type and reason): simulated to bedside chair. Encouraged pt to remain in standing, pt unable to tolerate more than 30 seconds, reporting "I feel too weak to keep standing". VSS throughout.         Functional mobility during ADLs: Min guard;Rolling walker (2 wheels)  Vision Patient Visual Report: No change from baseline       Perception  Perception Perception: Not tested   Praxis Praxis Praxis: Not tested    Pertinent Vitals/Pain Pain Assessment Pain Assessment: No/denies pain     Hand Dominance Right   Extremity/Trunk Assessment Upper Extremity Assessment Upper Extremity Assessment: Generalized weakness   Lower Extremity Assessment Lower Extremity Assessment: Generalized weakness   Cervical / Trunk Assessment Cervical / Trunk Assessment: Kyphotic   Communication Communication Communication: HOH   Cognition Arousal/Alertness: Awake/alert Behavior During Therapy: WFL for tasks assessed/performed Overall Cognitive Status: Within Functional Limits for tasks assessed                                 General Comments: alert and oriented x4, agreeable to targeted tasks     General Comments  Pre mobility BP 111/58   HR 99  Spo2 92% via 6 L [place];  With activity HR 122, 88% on 6L via [place], over 1 minute required to recover with pursed lipped breathing; pt declined further attempts for STS, mobiilty, reporting fatigue.    Exercises     Shoulder Instructions      Home Living Family/patient expects to be discharged to:: Private residence Living Arrangements: Spouse/significant other Available Help at Discharge: Family;Available 24 hours/day Type of Home: House Home Access: Stairs to enter [employer] of Steps: 3 Entrance Stairs-Rails: Right;Left Home Layout: One level     Bathroom Shower/Tub: Occupational psychologist: Handicapped height     Home Equipment: Conservation officer, nature (2 wheels);Cane - single point;Rollator (4 wheels);Shower seat;BSC/3in1   Additional Comments: pt sleeps in recliner (life chair),reports he uses an adjustable bed      Prior Functioning/Environment Prior Level of Function : Needs assist       Physical Assist : Mobility (physical);ADLs (physical)       ADLs Comments: pt reports MOD I In all ADL        OT Problem List: Decreased  strength;Decreased activity tolerance;Cardiopulmonary status limiting activity      OT Treatment/Interventions: Self-care/ADL training;Therapeutic exercise;Patient/family education;Energy conservation;Therapeutic activities;DME and/or AE instruction    OT Goals(Current goals can be found in the care plan section) Acute Rehab OT Goals Patient Stated Goal: to go home OT Goal Formulation: With patient Time For Goal Achievement: 10/16/21 Potential to Achieve Goals: Good ADL Goals Pt Will Perform Grooming: with supervision;standing;sitting Pt Will Perform Upper Body Dressing: with modified independence;sitting Pt Will Perform Lower Body Dressing: with modified independence;sit to/from stand Pt Will Transfer to Toilet: with modified independence;ambulating Pt Will Perform Toileting - Clothing Manipulation and hygiene: with modified independence;sit to/from stand Additional ADL Goal #1: Pt will improve tolerance for ADL tasks as evidenced by performing tasks with EC techniques as approrpiate 100% of the time with no vcs required  OT Frequency: Min 3X/week    Co-evaluation              AM-PAC OT "6 Clicks" Daily Activity     Outcome Measure Help from another person eating meals?: None Help from another person taking care of personal grooming?: None Help from another person toileting, which includes using toliet, bedpan, or urinal?: A Little Help from another person bathing (including washing, rinsing, drying)?: A Little Help from another person to put on and taking off regular upper body clothing?: None Help from another person to put on and taking off regular lower body clothing?: A Lot  6 Click Score: 20   End of Session Equipment Utilized During Treatment: Rolling walker (2 wheels) Nurse Communication: Mobility status;Other (comment) (vital signs with mobility)  Activity Tolerance: Patient tolerated treatment well;Patient limited by fatigue Patient left: in chair;with call  bell/phone within reach;with chair alarm set  OT Visit Diagnosis: Unsteadiness on feet (R26.81);Muscle weakness (generalized) (M62.81)                Time: 2130-8657 OT Time Calculation (min): 37 min Charges:  OT General Charges $OT Visit: 1 Visit OT Evaluation $OT Eval Low Complexity: 1 Low OT Treatments $Self Care/Home Management : 8-22 mins Shanon Payor, OTD OTR/L  10/02/21, 10:00 AM

## 2021-10-02 NOTE — Progress Notes (Signed)
Patient declined BIPAP. V60 in room if needed.

## 2021-10-02 NOTE — Progress Notes (Signed)
Physical Therapy Treatment Patient Details Name: Sean Riley MRN: [id number] DOB: 29-Dec-1950 Today's Date: 10/02/2021   History of Present Illness Pt adm 2/17 with acute on chronic hypoxic respiratory failure. PMH - copd, afib, AVR, chf, endocarditis    PT Comments    The pt was agreeable to session this afternoon with focus on progression of OOB mobility and LE exercises. He was able to tolerate a series of LE exercises against mod resistance but require 5-8 min seated rest with guided breathing to recover SpO2 from low of 85% on 5L to 90s. The pt was then able to complete x2 sit-stand transfers with minA to steady, and again required prolonged seated rest to recover from SpO2 low of 84% to 90s. The pt declined ambulation again today due to fatigue and SOB, will continue to benefit from max mobility and skilled PT acutely to progress endurance prior to return home.      Recommendations for follow up therapy are one component of a multi-disciplinary discharge planning process, led by the attending physician.  Recommendations may be updated based on patient status, additional functional criteria and insurance authorization.  Follow Up Recommendations  Home health PT     Assistance Recommended at Discharge Intermittent Supervision/Assistance  Patient can return home with the following A little help with walking and/or transfers;A little help with bathing/dressing/bathroom;Help with stairs or ramp for entrance   Equipment Recommendations  None recommended by PT    Recommendations for Other Services       Precautions / Restrictions Precautions Precautions: Fall Precaution Comments: watch O2, pt uses 5-8L at home Restrictions Weight Bearing Restrictions: No     Mobility  Bed Mobility Overal bed mobility: Needs Assistance             General bed mobility comments: pt recieved in bed    Transfers Overall transfer level: Needs assistance Equipment used: Rolling walker (2  wheels) Transfers: Sit to/from Stand Sit to Stand: Min guard           General transfer comment: pt rising x2 without UE support, but then reaching tol hold around therapist's waist in standing. declined use of RW. SpO2 to low of 84% after activity on 5L    Ambulation/Gait               General Gait Details: pt declined due to fatigue after x2 sit-stand       Balance Overall balance assessment: Needs assistance Sitting-balance support: Feet supported Sitting balance-[name]: Good     Standing balance support: Bilateral upper extremity supported, During functional activity Standing balance-[name]: Fair Standing balance comment: relies on UE support on RW for balance                            Cognition Arousal/Alertness: Awake/alert Behavior During Therapy: WFL for tasks assessed/performed Overall Cognitive Status: Within Functional Limits for tasks assessed                                 General Comments: pt with flat affect, defers to pointing or making wife answer for him mostly.        Exercises General Exercises - Lower Extremity Long Arc Quad: AROM, Both, 10 reps (against mod resistance) Heel Slides: AROM, Both, 10 reps, Seated (against mod resistance) Heel Raises: AROM, Both, 10 reps, Seated Other Exercises Other Exercises: repeated sit-stand x2  General Comments General comments (skin integrity, edema, etc.): SpO2 to low of 84% with seated LE exercises and repeated sit-stand.      Pertinent Vitals/Pain Pain Assessment Pain Assessment: No/denies pain     PT Goals (current goals can now be found in the care plan section) Acute Rehab PT Goals Patient Stated Goal: to go home PT Goal Formulation: With patient Time For Goal Achievement: 10/15/21 Potential to Achieve Goals: Good Progress towards PT goals: Progressing toward goals    Frequency    Min 3X/week      PT Plan Current plan remains appropriate        AM-PAC PT "6 Clicks" Mobility   Outcome Measure  Help needed turning from your back to your side while in a flat bed without using bedrails?: A Little Help needed moving from lying on your back to sitting on the side of a flat bed without using bedrails?: A Little Help needed moving to and from a bed to a chair (including a wheelchair)?: A Little Help needed standing up from a chair using your arms (e.g., wheelchair or bedside chair)?: A Little Help needed to walk in hospital room?: A Lot Help needed climbing 3-5 steps with a railing? : A Lot 6 Click Score: 16    End of Session Equipment Utilized During Treatment: Gait belt;Oxygen Activity Tolerance: Patient limited by fatigue Patient left: in chair;with call bell/phone within reach;with chair alarm set Nurse Communication: Mobility status PT Visit Diagnosis: Muscle weakness (generalized) (M62.81);Unsteadiness on feet (R26.81)     Time: 2841-3244 PT Time Calculation (min) (ACUTE ONLY): 31 min  Charges:  $Therapeutic Exercise: 23-37 mins                     West Carbo, PT, DPT   Acute Rehabilitation Department Pager #: [phone]   Sandra Cockayne 10/02/2021, 3:35 PM

## 2021-10-02 NOTE — Progress Notes (Addendum)
Advanced Heart Failure Rounding Note  PCP-Cardiologist: None   Subjective:    Yesterday switched to torsemide.   Remains on 6 liters [place]   Feels ok today. Denies SOB.   Objective:   Weight Range: 75.2 kg Body mass index is 22.49 kg/m.   Vital Signs:   Temp:  [97.5 F (36.4 C)-98.6 F (37 C)] 97.7 F (36.5 C) (02/23 0751) Pulse Rate:  [77-99] 99 (02/23 0751) Resp:  [20-28] 28 (02/23 0751) BP: (109-140)/(55-86) 109/55 (02/23 0751) SpO2:  [90 %-96 %] 93 % (02/23 0751) Weight:  [75.2 kg] 75.2 kg (02/23 0614) Last BM Date : 10/01/21  Weight change: Filed Weights   09/30/21 0444 10/01/21 0238 10/02/21 0614  Weight: 76.6 kg 75.2 kg 75.2 kg    Intake/Output:   Intake/Output Summary (Last 24 hours) at 10/02/2021 0907 Last data filed at 10/02/2021 0802 Gross per 24 hour  Intake 843 ml  Output 2200 ml  Net -1357 ml      Physical Exam  General:  No resp difficulty HEENT: normal Neck: supple. JVP 7-8. Carotids 2+ bilat; no bruits. No lymphadenopathy or thryomegaly appreciated. Cor: PMI nondisplaced. Regular rate & rhythm. No rubs, gallops or murmurs. Lungs: clear on 6 liters [place] Abdomen: soft, nontender, nondistended. No hepatosplenomegaly. No bruits or masses. Good bowel sounds. Extremities: no cyanosis, clubbing, rash, edema Neuro: alert & orientedx3, cranial nerves grossly intact. moves all 4 extremities w/o difficulty. Affect pleasant  Telemetry   SR 80-90s with occasional PVCs.   EKG    N/A  Labs    CBC Recent Labs    10/01/21 0339 10/02/21 0408  WBC 13.7* 13.7*  HGB 11.7* 12.0*  HCT 37.6* 37.4*  MCV 83.9 82.4  PLT 284 458   Basic Metabolic Panel Recent Labs    10/01/21 0339 10/02/21 0408  NA 132* 131*  K 3.6 3.7  CL 89* 90*  CO2 33* 32  GLUCOSE 132* 109*  BUN 24* 26*  CREATININE 1.37* 1.34*  CALCIUM 8.8* 8.7*   Liver Function Tests Recent Labs    09/30/21 1451  PROT 7.4  ALBUMIN 3.2*   No results for input(s): LIPASE,  AMYLASE in the last 72 hours. Cardiac Enzymes No results for input(s): CKTOTAL, CKMB, CKMBINDEX, TROPONINI in the last 72 hours.  BNP: BNP (last 3 results) Recent Labs    08/16/21 0149 09/02/21 1204 09/26/21 1644  BNP 303.9* 26.8 104.2*    ProBNP (last 3 results) No results for input(s): PROBNP in the last 8760 hours.   D-Dimer No results for input(s): DDIMER in the last 72 hours. Hemoglobin A1C No results for input(s): HGBA1C in the last 72 hours. Fasting Lipid Panel No results for input(s): CHOL, HDL, LDLCALC, TRIG, CHOLHDL, LDLDIRECT in the last 72 hours. Thyroid Function Tests No results for input(s): TSH, T4TOTAL, T3FREE, THYROIDAB in the last 72 hours.  Invalid input(s): FREET3  Other results:   Imaging    CT CHEST W CONTRAST  Result Date: 10/01/2021 CLINICAL DATA:  Pleural effusions, suspected malignancy EXAM: CT CHEST WITH CONTRAST TECHNIQUE: Multidetector CT imaging of the chest was performed during intravenous contrast administration. RADIATION DOSE REDUCTION: This exam was performed according to the departmental dose-optimization program which includes automated exposure control, adjustment of the mA and/or kV according to patient size and/or use of iterative reconstruction technique. CONTRAST:  127mL OMNIPAQUE IOHEXOL 300 MG/ML  SOLN IV COMPARISON:  08/16/2021 FINDINGS: Cardiovascular: Atherosclerotic calcifications aorta, proximal great vessels, and coronary arteries. Prior stenting of the thoracic  aorta from proximal arch through descending thoracic aorta. Known aortic dissection. Enlargement of cardiac chambers. No pericardial effusion. Pulmonary arteries grossly patent on non targeted exam. Aneurysmal dilatation of the upper abdominal aorta 4.5 cm transverse image 135. Mediastinum/Nodes: Esophagus unremarkable. Base of cervical region normal appearance. Few normal size mediastinal lymph nodes. No thoracic adenopathy. Lungs/Pleura: Small BILATERAL pleural  effusions, partially loculated, mildly increased since previous exam. Foci of air are seen within the RIGHT pleural effusion and at the RIGHT apex, consistent with minimal pneumothorax,, patient post thoracentesis 09/30/2021. Scattered pleural thickening, nonspecific. Rounded atelectasis in lingula versus scarring unchanged. Emphysematous changes. Additional scarring in RIGHT upper lobe. BILATERAL lower lobe infiltrates versus atelectasis unchanged. Calcified granulomata RIGHT upper lobe. No additional discrete mass. Upper Abdomen: Unremarkable Musculoskeletal: Diffuse osseous demineralization. IMPRESSION: Small BILATERAL pleural effusions, partially loculated, mildly increased since previous exam. Foci of air within the RIGHT pleural effusion and at the RIGHT apex consistent with minimal pneumothorax, likely related to thoracentesis 09/30/2021. Persistent atelectasis and or infiltrates and scarring in both lungs without obvious pulmonary mass. Known thoracic aortic dissection post stenting. Aneurysmal dilatation of the upper abdominal aorta 4.5 cm transverse; full extent of abdominal aorta not imaged on CT chest, please refer to recent CT abdomen and pelvis exam of 08/17/2021. Aortic Atherosclerosis (ICD10-I70.0) and Emphysema (ICD10-J43.9). Electronically Signed   By: Lavonia Dana M.D.   On: 10/01/2021 16:36     Medications:     Scheduled Medications:  apixaban  5 mg Oral BID   empagliflozin  10 mg Oral QAC breakfast   escitalopram  10 mg Oral Daily   feeding supplement  237 mL Oral BID BM   flecainide  75 mg Oral BID   fluticasone furoate-vilanterol  1 puff Inhalation Daily   And   umeclidinium bromide  1 puff Inhalation Daily   mouth rinse  15 mL Mouth Rinse BID   metoprolol tartrate  12.5 mg Oral BID   multivitamin with minerals  1 tablet Oral Daily   pantoprazole  40 mg Oral Daily   potassium chloride  40 mEq Oral Daily   rosuvastatin  5 mg Oral Daily   torsemide  60 mg Oral Daily     Infusions:  sodium chloride Stopped (09/28/21 1530)    PRN Medications: sodium chloride, acetaminophen **OR** acetaminophen, albuterol, ALPRAZolam, fluticasone, ipratropium-albuterol, polyethylene glycol, polyvinyl alcohol    Assessment/Plan   1. Acute on chronic hypoxemic respiratory failure: Patient has multifactorial dyspnea at baseline with COPD on home oxygen 5L + OSA with CPAP, diastolic CHF/RV failure, and significant deconditioning. He has had several days of IV Lasix but I/Os not well-recorded.   ?COPD exacerbation.  - S/P thoracentesis.  - Volume status  improved after IV lasix.  - Consider treatment for COPD exacerbation with steroids, nebs, abx.  Will discuss with primary service.  -  Consider RHC if dyspnea does not improve.  -Continue to wean oxygen  2. Recent Enterococcal bacteremia: 1/23.  Blood Cultures + enterococcus faecalis. Source uncertain. Treated presumptively for endocarditis. Has bioprosthetic AoV. Echo (1/23) w/ mod TR, mild MR. Prosthetic AoV not well visualized due to  poor acoustic windows. TEE (1/23) with no endocarditis.  No fever this admission.  - Completed linezolid course. 3. OSA: Continue CPAP.  4. ?Acute on chronic diastolic CHF with prominent RV dysfunction:  PYP study was not suggestive of ATTR amyloidosis and myeloma panel was negative.   Echo in 1/23 showed EF 55-60% RV moderately enlarged w/ moderately elevated RVSP 55.4 mmHg,  RA severely dilated, mod TR, mild MR. Prosthetic AoV not well visualized, poor acoustic windows. Peak and mean gradients through the valve are 5 and 2 mm Hg respectively. Previous echo in May 2022, peak and mean gradients were 34 and 18 mm Hg respectively. Chronic NYHA IIIb symptoms.  On exam today, he may be mildly volume overloaded but not particularly impressive. As above, ?COPD playing a major role here.  - Volume status stable. Continue torsemide 60 mg daily.  - If symptoms do not improve, would plan RHC to assess  filling pressures.  - Continue metoprolol 12.5 mg bid. 5. 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 Roxy Manns. Echoes have shown stable bioprosthetic aortic valve.  Dr. Trula Slade has been following his residual dissected aorta.  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 6. Atrial fibrillation: Paroxysmal.  S/p DCCV in 11/21.   - Remains in SR - Continue flecainide + metoprolol. - Continue Eliquis 5 mg bid.  7. COPD: Moderate to severe obstruction on PFTs, prior smoker.  He is on home oxygen. Followed by pulmonology.  End expiratory wheezing and distant BS.  - As above, consider treatment for COPD exacerbation.  8. Deconditioning.  PT/OT following. ? CIR.    Length of Stay: [place], NP  10/02/2021, 9:07 AM  Advanced Heart Failure Team Pager [phone] (M-F; 7a - 5p)  Please contact [facility] for night-coverage after hours (5p -7a ) and weekends on [email or web address]  Patient seen with NP, agree with the above note.   Breathing stable today, on 6L [place] with oxygen saturation 95%.  He is back on po torsemide.    Pleural fluid from thoracentesis was exudative, no growth in culture so far.  CT chest with small, partially loculated bilateral effusions.   Telemetry: NSR with PVCs  General: NAD Neck: No JVD, no thyromegaly or thyroid nodule.  Lungs: Decreased BS at bases.  CV: Nondisplaced PMI.  Heart regular S1/S2, no S3/S4, no murmur.  No peripheral edema.   Abdomen: Soft, nontender, no hepatosplenomegaly, no distention.  Skin: Intact without lesions or rashes.  Neurologic: Alert and oriented x 3.  Psych: Normal affect. Extremities: No clubbing or cyanosis.  HEENT: Normal.   Volume status looks ok to me at this point, continue torsemide 60 mg daily.   Further pulmonary workup per primary team.  Pulmonary service consulted.   [name] 10/02/2021 11:36 AM

## 2021-10-02 NOTE — Progress Notes (Signed)
RT note. Patient currently on 6L [place] sat 94%. V60 in patients room if needed later. Patient not requiring at this time. RT will continue to monitor.

## 2021-10-02 NOTE — Telephone Encounter (Signed)
Please arrange follow up in 2-4 weeks. Reason COPD exacerbation, chronic pleural effusion

## 2021-10-02 NOTE — Progress Notes (Signed)
HD#6 SUBJECTIVE:  Patient Summary: Sean Riley is a 71 y.o. with a pertinent PMH of chronic hypoxic respiratory failure secondary to COPD, atrial fibrillation, history of aortic valve replacement, chronic diastolic congestive heart failure, and recent history of enterococcal endocarditis, who presented with worsening hypoxia and admitted for acute on chronic hypoxic hypercapnic respiratory failure.   Overnight Events: None  Interim History: Patient is sitting in bedside recliner cleaning himself up including shaving, brushing his hair. He has no complaints at this time. His wife explained that his daughter has been watching MyChart closely and that some of the notes are worrisome because they include mention of malignancy concerns and changes in the size of his abdominal aneurysm. I reassured her that we are keeping a close eye on all of the information for Sean Riley [facility]) and consulting the appropriate teams as needed. She voiced reassurance and understanding.  OBJECTIVE:  Vital Signs: Vitals:   10/02/21 0646 10/02/21 0716 10/02/21 0720 10/02/21 0751  BP:   123/71 (!) 109/55  Pulse:   83 99  Resp: 20  (!) 28 (!) 28  Temp:   97.7 F (36.5 C) 97.7 F (36.5 C)  TempSrc:   Oral Oral  SpO2:  94% 93% 93%  Weight:      Height:       Supplemental O2: HFNC SpO2: 93 % O2 Flow Rate (L/min): 6 L/min FiO2 (%): 50 %  Filed Weights   09/30/21 0444 10/01/21 0238 10/02/21 0614  Weight: 76.6 kg 75.2 kg 75.2 kg     Intake/Output Summary (Last 24 hours) at 10/02/2021 1004 Last data filed at 10/02/2021 0802 Gross per 24 hour  Intake 843 ml  Output 2200 ml  Net -1357 ml   Net IO Since Admission: -2,953.67 mL [10/02/21 1004]  Physical Exam: Constitutional:Chronically ill appearing gentleman sitting comfortably in bedside recliner. Cardio:Regular rate and rhythm. Systolic murmur noted, unchanged. Pulm:Appropriate O2 saturations on 5L HFNC. Decreased bibasilar breath sounds improved. Some  crackles noted diffusely. Abdomen:Soft, nontender, nondistended. YQM:VHQIONGE for extremity edema. Skin:Warm and dry. Neuro:Alert and oriented x3. No focal deficit noted. Psych:Normal mood and affect.  Patient Lines/Drains/Airways Status     Active Line/Drains/Airways     Name Placement date Placement time Site Days   Peripheral IV 09/26/21 18 G Left Forearm 09/26/21  --  Forearm  6   External Urinary Catheter --  --  --  --   Incision (Closed) 03/24/17 Chest 03/24/17  0513  -- 1653   Incision (Closed) 03/24/17 Chest Right;Upper 03/24/17  --  -- 1653   Incision (Closed) 04/07/17 Abdomen Left;Lateral 04/07/17  1300  -- 1639   Incision (Closed) 03/30/18 Groin Right 03/30/18  1042  -- 1282   Pressure Injury 08/16/21 Buttocks Right;Left Stage 1 -  Intact skin with non-blanchable redness of a localized area usually over a bony prominence. Deep tissue injury with erythema and maroon coloration 08/16/21  0000  -- 47   Pressure Injury 09/27/21 Buttocks Right;Left;Mid Stage 2 -  Partial thickness loss of dermis presenting as a shallow open injury with a red, pink wound bed without slough. bilateral buttock has 2 scab about 1cm in size 09/27/21  0335  -- 5             ASSESSMENT/PLAN:  Assessment: Active Problems:   Atrial fibrillation (HCC)   COPD (chronic obstructive pulmonary disease) (HCC)   Acute on chronic respiratory failure with hypercapnia (HCC)   Acute on chronic diastolic CHF (congestive heart failure) ([place])  Stage 2 skin ulcer of sacral region [facility])   Malnutrition of moderate degree   Pleural effusion   Plan: Acute on chronic hypoxic and hypercapnic respiratory failure HFpEF exacerbation Bilateral pleural effusions Net -1.9L; no weight change overnight. Leukocytosis unchanged at 13.7. Hyponatremia unchanged. Urine studies revealed U Na 18 and U osm 305, concerning for inappropriately concentrated urine with his clinical picture. CT chest was obtained 02/22 which showed  small bilateral pleural effusions, partially loculated, mildly increased since previous exam;  foci of air within the right  pleural effusion and at the right apex consistent with minimal pneumothorax, likely related to thoracentesis 09/30/2021; persistent atelectasis and or infiltrates and scarring in both lungs without obvious pulmonary mass; known thoracic aortic dissection post stenting. Further analysis of pleural fluid from 02/21 thoracentesis reveals culture with NGTD and negative cytology. PT/OT consulted and recommend home health PT/OT; order has been placed. -Will consult pulmonology given pleural fluid analysis results combined with clinical presentation and medical history. Consider repeat thoracentesis for additional cytology if malignancy continues to be of high concern. -Torsemide 60 mg daily -Wean HFNC as tolerated with O2 goal 88-95%.  -Daily weights, strict I's and O's -Daily BMP, K above 4 and mag above 2.  Potassium 40 mEq daily.   COPD Appropriate oxygen saturations on 6L HFNC. Patient denies respiratory distress at this time. Physical exam, specifically lung exam, improved specifically improved bibasilar breath sounds and markedly reduced crackles. Will keep COPD exacerbation on differential and consult pulmonology. -Albuterol inhaler twice daily -DuoNebs every 4 hours as needed -Butisol-glycopyrrolate-formoterol inhaler twice daily   A-fib with RVR Patient was in sinus rhythm with rate control on exam.  -Continue flecainide 75 mg twice daily, metoprolol 12.5 mg twice daily, Eliquis 5 mg twice daily -Continue telemetry   CKD stage IIIA Serum creatine stable at 1.34 (1.37 02/20). Transitioned to torsemide 60 mg daily 02/22. -Avoid nephrotoxic agents -Trend BMP -Continue torsemide 60 mg daily -Encourage PO intake   History of enterococcal endocarditis Leukocytosis unchanged. He has been AF and HD stable. Murmur has not changed.  -Continue to monitor   History of  depression/anxiety Patient has history of anxiety depression treated with Lexapro 10 mg daily and Xanax 1 mg every night.  He was continued on Lexapro on admission and Xanax is as needed only.   -Continue Lexapro 10 mg daily -Xanax 1 mg at bedtime as needed  Best Practice: Diet: Regular diet IVF: Fluids: IV push only, no IV fluids VTE: Eliquis 5 mg daily Code: Full AB: None Family Contact: [name], called and notified. DISPO: Anticipated discharge in 2-4 days to Home pending Medical stability.  Signature: Farrel Gordon, D.O.  Internal Medicine Resident, PGY-1 [facility]  Pager: [phone] 10:04 AM, 10/02/2021   Please contact the on call pager after 5 pm and on weekends at [phone].

## 2021-10-03 ENCOUNTER — Inpatient Hospital Stay (HOSPITAL_COMMUNITY): Payer: Medicare Other

## 2021-10-03 ENCOUNTER — Other Ambulatory Visit (HOSPITAL_COMMUNITY): Payer: Self-pay

## 2021-10-03 DIAGNOSIS — J90 Pleural effusion, not elsewhere classified: Secondary | ICD-10-CM | POA: Diagnosis not present

## 2021-10-03 LAB — GLUCOSE, CAPILLARY: Glucose-Capillary: 101 mg/dL — ABNORMAL HIGH (ref 70–99)

## 2021-10-03 LAB — BODY FLUID CULTURE W GRAM STAIN: Culture: NO GROWTH

## 2021-10-03 LAB — CBC
HCT: 35.7 % — ABNORMAL LOW (ref 39.0–52.0)
Hemoglobin: 11.2 g/dL — ABNORMAL LOW (ref 13.0–17.0)
MCH: 26.4 pg (ref 26.0–34.0)
MCHC: 31.4 g/dL (ref 30.0–36.0)
MCV: 84 fL (ref 80.0–100.0)
Platelets: 298 10*3/uL (ref 150–400)
RBC: 4.25 MIL/uL (ref 4.22–5.81)
RDW: 17.5 % — ABNORMAL HIGH (ref 11.5–15.5)
WBC: 13 10*3/uL — ABNORMAL HIGH (ref 4.0–10.5)
nRBC: 0 % (ref 0.0–0.2)

## 2021-10-03 LAB — BASIC METABOLIC PANEL
Anion gap: 11 (ref 5–15)
BUN: 28 mg/dL — ABNORMAL HIGH (ref 8–23)
CO2: 29 mmol/L (ref 22–32)
Calcium: 8.7 mg/dL — ABNORMAL LOW (ref 8.9–10.3)
Chloride: 92 mmol/L — ABNORMAL LOW (ref 98–111)
Creatinine, Ser: 1.28 mg/dL — ABNORMAL HIGH (ref 0.61–1.24)
GFR, Estimated: 60 mL/min — ABNORMAL LOW (ref >60)
Glucose, Bld: 104 mg/dL — ABNORMAL HIGH (ref 70–99)
Potassium: 3.8 mmol/L (ref 3.5–5.1)
Sodium: 132 mmol/L — ABNORMAL LOW (ref 135–145)

## 2021-10-03 MED ORDER — HYDROCORTISONE (PERIANAL) 2.5 % EX CREA
1.0000 "application " | TOPICAL_CREAM | Freq: Four times a day (QID) | CUTANEOUS | Status: DC | PRN
  Filled 2021-10-03: qty 28.35

## 2021-10-03 MED ORDER — GUAIFENESIN 200 MG PO TABS
200.0000 mg | ORAL_TABLET | ORAL | Status: DC | PRN
  Filled 2021-10-03: qty 1

## 2021-10-03 MED ORDER — PREDNISONE 20 MG PO TABS
20.0000 mg | ORAL_TABLET | Freq: Every day | ORAL | 0 refills | Status: DC
  Filled 2021-10-03: qty 3, 3d supply, fill #0

## 2021-10-03 MED ORDER — DOXYCYCLINE HYCLATE 100 MG PO TABS
100.0000 mg | ORAL_TABLET | Freq: Two times a day (BID) | ORAL | 0 refills | Status: DC
  Filled 2021-10-03: qty 7, 4d supply, fill #0

## 2021-10-03 MED ORDER — TRIAMCINOLONE 0.1 % CREAM:EUCERIN CREAM 1:1
TOPICAL_CREAM | Freq: Two times a day (BID) | CUTANEOUS | Status: DC | PRN
  Filled 2021-10-03: qty 1

## 2021-10-03 NOTE — Telephone Encounter (Signed)
Patient is scheduled on 10/22/2021 at 10:30am with Dr. Vaughan Browner. Nothing further needed.

## 2021-10-03 NOTE — Progress Notes (Addendum)
Received Epic message from RN about a new skin rash on patient's back.  Patient was seen at bedside.  He states that he gets these rashes when he lies down on his back.  He states that typically creams help the rash go away.  Exam: See media tab for photo  Plan: Skin findings consistent with miliaria.  General cooling measures plus triamcinolone cream as needed.

## 2021-10-03 NOTE — TOC Initial Note (Addendum)
Transition of Care [facility]) - Initial/Assessment Note    Patient Details  Name: Sean Riley MRN: [id number] Date of Birth: 03-26-1951  Transition of Care [facility]) CM/SW Contact:    Erenest Rasher, RN Phone Number: [phone] 10/03/2021, 3:07 PM  Clinical Narrative:                 HF TOC CM notified [facility] of scheduled dc home with HH. Pt has oxygen, rolling walker and bedside commode at home.   Expected Discharge Plan: [place] Barriers to Discharge: No Barriers Identified   Patient Goals and CMS Choice  Expected Discharge Plan and Services Expected Discharge Plan: [place] In-house Referral: GIP Discharge Planning Services: CM Consult Post Acute Care Choice: Home Health   Expected Discharge Date: 10/03/21                         HH Arranged: RN, PT HH Agency: [place] ([facility]) Date HH Agency Contacted: 10/03/21 Time [place]: 53 Representative spoke with at [place]: [place] Arrangements/Services   Lives with:: Spouse Patient language and need for interpreter reviewed:: Yes Do you feel safe going back to the place where you live?: Yes      Need for Family Participation in Patient Care: No (Comment) Care giver support system in place?: No (comment) Current home services: DME (CPAP, oxygen ([employer]), rolling walker, rollator, bedside commode, cane) Criminal Activity/Legal Involvement Pertinent to Current Situation/Hospitalization: No - Comment as needed  Activities of Daily Living Home Assistive Devices/Equipment: Oxygen, Cane (specify quad or straight) ADL Screening (condition at time of admission) Patient's cognitive ability adequate to safely complete daily activities?: Yes Is the patient deaf or have difficulty hearing?: Yes Does the patient have difficulty seeing, even when wearing glasses/contacts?: Yes Does the patient have difficulty concentrating, remembering, or  making decisions?: No Patient able to express need for assistance with ADLs?: Yes Does the patient have difficulty dressing or bathing?: No Independently performs ADLs?: Yes (appropriate for developmental age) Does the patient have difficulty walking or climbing stairs?: Yes Weakness of Legs: None Weakness of Arms/Hands: None  Permission Sought/Granted Permission sought to share information with : Case Manager, PCP, Family Supports Permission granted to share information with : Yes, Verbal Permission Granted  Share Information with NAME: [place] granted to share info w AGENCY: [employer] granted to share info w Relationship: wife  Permission granted to share info w Contact Information: [phone]  Emotional Assessment              Admission diagnosis:  Shortness of breath [R06.02] Pleural effusion [J90] Hypoxia [R09.02] Acute exacerbation of CHF (congestive heart failure) ([place]) [I50.9] Patient Active Problem List   Diagnosis Date Noted   Pleural effusion    Malnutrition of moderate degree 09/29/2021   Stage 2 skin ulcer of sacral region ([place]) 09/27/2021   Medication monitoring encounter 09/09/2021   Protein-calorie malnutrition, severe ([place]) 09/03/2021   Acute on chronic diastolic CHF (congestive heart failure) ([place])    Pressure injury of skin 01/31/7627   Acute metabolic encephalopathy 31/51/7616   Prosthetic valve endocarditis ([place])    Enterococcal bacteremia 08/16/2021   Severe sepsis with septic shock ([place]) 08/15/2021   AKI (acute kidney injury) ([place])    Cellulitis of left ankle 06/23/2021   UTI due to extended-spectrum beta lactamase (ESBL) producing Escherichia coli 01/27/2021   Chronic diastolic  CHF (congestive heart failure) ([place]) 01/27/2021   Acute GI bleeding 01/27/2021   Normocytic anemia 01/24/2021   Acute on chronic respiratory failure with hypercapnia (HCC) 01/24/2021   GI bleed 01/24/2021   Chest pain 01/24/2021    Leukocytosis 01/24/2021   CKD (chronic kidney disease) 01/24/2021   Acute respiratory failure with hypoxia ([place]) 09/06/2020   Secondary hypercoagulable state ([place]) 06/26/2020   Hormonal disorder 10/16/2019   Insomnia 12/29/2018   Chronic anemia 12/29/2018   Atrial tachycardia ([place])    Thoracic aortic aneurysm without rupture 03/30/2018   COPD (chronic obstructive pulmonary disease) ([place]) 10/06/2017   Psoriasis 09/14/2017   Depression 04/18/2017   S/P aortic dissection repair 03/24/2017   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   Elevated PSA 01/11/2016   PCP NOTES >>>>>>> 07/24/2015   Gynecomastia, male 10/10/2014   Allergic rhinitis 09/10/2014   Dermatitis 05/11/2014   Annual physical exam 05/10/2014   Erectile dysfunction 05/10/2014   HTN (hypertension) 10/28/2013   Bronchospasm 10/28/2013   ETOH abuse 10/28/2013   Varicose veins of lower extremities with ulcer ([place]) 09/15/2011   RBBB 11/14/2008   Hyperlipidemia 03/09/2008   tobacco use d/o 03/09/2008   Atrial fibrillation ([place]) 03/09/2008   GERD 03/09/2008   Microscopic hematuria 03/09/2008   BPH (benign prostatic hyperplasia) 03/09/2008   PCP:  Colon Branch, MD Pharmacy:   [place], [place] [address] Phone: [phone] Fax: [phone]  [id number] - [place], [place] - [address] [place] [address] Phone: [phone] Fax: [phone]  [facility] - [place], [place] - [address] [place] [address] Phone: [phone] Fax: [place] [address] [place] [address] Phone: [phone] Fax: [phone]     Social Determinants of Health (SDOH) Interventions    Readmission Risk Interventions No flowsheet data found.

## 2021-10-03 NOTE — Progress Notes (Signed)
Palliative Medicine Inpatient Follow Up Note   HPI: Sean Riley is a 71 y.o. with a pertinent PMH of chronic hypoxic respiratory failure secondary to COPD, atrial fibrillation, history of aortic valve replacement, chronic diastolic congestive heart failure, and recent history of enterococcal endocarditis, who presented with worsening hypoxia and admitted for work-up for his acute on chronic hypoxic hypercapnic respiratory failure.   Palliative care has been asked to get involved in the setting of recurring rehospitalization's, multiple chronic comorbidities, and overall frailty to further discuss goals of care.  Today's Discussion (10/03/2021):  *Please note that this is a verbal dictation therefore any spelling or grammatical errors are due to the "[place] One" system interpretation.  Chart reviewed inclusive of vital signs, progress notes, laboratory results, and diagnostic images.   I met with Sean Riley this morning. He was in good spirits. He complains of feeling "cold". We were able to turn the heat up and his NT, Sean Riley supplied him with a warm blanket. Otherwise this morning Sean Riley denies shortness of breath or chest pressure.   Sean Riley shares with me that he is hopeful to get home sooner than later.  Reviewed that this will be to the discretion of his primary team.   I again, discussed my role as a Palliative provider and how I support patients and their families when they are contenting with acute illness. _________________________________________________ Addendum:  I met with Sean Riley spouse, Sean Riley this morning. She shares that Sean Riley will agree to a DNR when he understands his conditions are "terminal". He would like to hear this from the pulmonologist and cardiologist though. Sean Riley shares that after this is established the lawyer will complete supporting documents.  At this time, Sean Riley has our contact information if additional Palliative support is needed.  Sean Riley does seem to  understand that Sean Riley has progressive diseases that are likely to worsen over time.  Questions and concerns addressed   Palliative Support Provided  Objective Assessment: Vital Signs Vitals:   10/03/21 0717 10/03/21 0923  BP: (!) 126/55 115/62  Pulse: 72 78  Resp: 20   Temp: 98.3 F (36.8 C)   SpO2: 95%     Intake/Output Summary (Last 24 hours) at 10/03/2021 1109 Last data filed at 10/03/2021 0750 Gross per 24 hour  Intake 597 ml  Output 1100 ml  Net -503 ml    Last Weight  Most recent update: 10/03/2021  4:50 AM    Weight  78.1 kg (172 lb 3.2 oz)            Gen: Chronically ill-appearing elderly Caucasian male in moderate distress HEENT: Dry mucous membranes CV: Irregular rate and rhythm PULM: On 7L nasal cannula breathing is even ABD: Abdominal distention present EXT: bilateral lower extremity edema Neuro: Alert and oriented x3  SUMMARY OF RECOMMENDATIONS   Full code --> Sean Riley would like all measures to maintain life presently though if he was told his conditions were terminal would then accept a DNAR/DNI. I believe the Pulmonology team and Cardiology team would be most helpful to better support education on the progressive nature of Sean Riley illnesses and how given his underlying disease burden CPR and intubation be would unlikely to help him in the long term.   Sean Riley would likely benefit from outpatient palliative support - Family will alert the team if they would like this moving forward.  Goal: To get Back home to prior level of function  Palliative medicine team will continue to follow incrementally   MDM - Medium  ______________________________________________________________________________________ [place] Team Team Cell Phone: [phone] Please utilize secure chat with additional questions, if there is no response within 30 minutes please call the above phone number  Palliative Medicine Team providers are  available by phone from 7am to 7pm daily and can be reached through the team cell phone.  Should this patient require assistance outside of these hours, please call the patient's attending physician.

## 2021-10-03 NOTE — Telephone Encounter (Signed)
I called, discussed with [name] and all questions answered. Nothing further needed

## 2021-10-03 NOTE — Discharge Summary (Signed)
Name: Sean Riley MRN: [id number] DOB: 01-26-1951 71 y.o. PCP: Colon Branch, MD  Date of Admission: 09/26/2021  1:51 PM Date of Discharge:  10/03/2021 Attending Physician: Dr. Angelia Mould  DISCHARGE DIAGNOSIS:  Primary Problem: <principal problem not specified>   Hospital Problems: Active Problems:   Atrial fibrillation (HCC)   COPD (chronic obstructive pulmonary disease) (HCC)   Acute on chronic respiratory failure with hypercapnia (HCC)   Acute on chronic diastolic CHF (congestive heart failure) (HCC)   Stage 2 skin ulcer of sacral region [facility])   Malnutrition of moderate degree   Pleural effusion    DISCHARGE MEDICATIONS:   Allergies as of 10/03/2021       Reactions   Simvastatin Other (See Comments)   Mental status changes   Celecoxib Other (See Comments)   GI UPSET AND INFLAMMATION   Codeine Other (See Comments)   GI UPSET AND INFLAMMATION   Nsaids Other (See Comments)   GI UPSET AND INFLAMMATION (can tolerate via IV)   Tape Other (See Comments)   Medical tape and Band-Aids PULL OFF THE SKIN; please use Coban wrap   Cephalexin Itching, Rash   Latex Rash        Medication List     TAKE these medications    albuterol 108 (90 Base) MCG/ACT inhaler Commonly known as: ProAir HFA Inhale 2 puffs into the lungs every 4 (four) hours as needed for wheezing or shortness of breath.   ALPRAZolam 1 MG tablet Commonly known as: XANAX Take 1 mg by mouth at bedtime.   apixaban 5 MG Tabs tablet Commonly known as: ELIQUIS Take 1 tablet (5 mg total) by mouth 2 (two) times daily.   bisacodyl 5 MG EC tablet Commonly known as: DULCOLAX Take 5 mg by mouth daily as needed for moderate constipation.   Breztri Aerosphere 160-9-4.8 MCG/ACT Aero Generic drug: Budeson-Glycopyrrol-Formoterol Inhale 2 puffs into the lungs in the morning and at bedtime.   doxycycline 100 MG tablet Commonly known as: VIBRA-TABS Take 1 tablet (100 mg total) by mouth every 12 (twelve)  hours.   escitalopram 10 MG tablet Commonly known as: LEXAPRO TAKE 1 TABLET DAILY   esomeprazole 40 MG capsule Commonly known as: NexIUM Take 1 capsule (40 mg total) by mouth daily before breakfast.   flecainide 50 MG tablet Commonly known as: TAMBOCOR Take 1.5 tablets (75 mg total) by mouth 2 (two) times daily.   fluticasone 50 MCG/ACT nasal spray Commonly known as: FLONASE Place 1 spray into both nostrils 2 (two) times daily as needed for allergies. Use one spray in each nostril twice daily.   hydroxypropyl methylcellulose / hypromellose 2.5 % ophthalmic solution Commonly known as: ISOPTO TEARS / GONIOVISC Place 1-2 drops into both eyes 3 (three) times daily as needed (dry/irritated eyes).   ipratropium-albuterol 0.5-2.5 (3) MG/3ML Soln Commonly known as: DUONEB Take 3 mLs by nebulization every 4 (four) hours as needed. What changed: reasons to take this   Jardiance 10 MG Tabs tablet Generic drug: empagliflozin TAKE 1 TABLET DAILY BEFORE BREAKFAST What changed:  how much to take when to take this   magnesium hydroxide 400 MG/5ML suspension Commonly known as: MILK OF MAGNESIA Take 7.5 mLs by mouth daily as needed (stomach cramps/constipation).   metoprolol tartrate 25 MG tablet Commonly known as: LOPRESSOR Take 0.5 tablets (12.5 mg total) by mouth 2 (two) times daily. Please make yearly appt with Dr. Caryl Comes for January 2022 for future refills. Thank you 1st attempt   OXYGEN Inhale  5 L/min into the lungs continuous.   potassium chloride SA 20 MEQ tablet Commonly known as: KLOR-CON M Take 40 mEq by mouth at bedtime.   predniSONE 20 MG tablet Commonly known as: DELTASONE Take 1 tablet (20 mg total) by mouth daily with breakfast. Start taking on: October 04, 2021   rosuvastatin 5 MG tablet Commonly known as: CRESTOR Take 5 mg by mouth at bedtime.   torsemide 20 MG tablet Commonly known as: DEMADEX Take 3 tablets (60 mg total) by mouth daily.   triamcinolone  cream 0.5 % Commonly known as: KENALOG Apply 1 application topically 3 (three) times daily. What changed:  when to take this reasons to take this        DISPOSITION AND FOLLOW-UP:  Mr.Idan E Ferrie was discharged from [facility] in [place] condition. At the hospital follow up visit please address:  Acute on chronic hypoxic and hypercapnic respiratory failure Acute on chronic diastolic CHF Pleural effusions, bilateral, chronic Monitor closely for signs of acute exacerbation and increased oxygen needs. Home regimen resumed during stay.  COPD Monitor for signs of disease progression. Assess for symptom improvement after COPD exacerbation treatment with prednisone and doxycycline.   Follow-up Recommendations: Consults: None Labs: Basic Metabolic Profile and CBC Studies: None Medications:   Continue prednisone, one tablet daily for three more days starting tomorrow, Saturday 02/25.  Take one dose of doxycycline tonight. Then starting tomorrow, Saturday 02/25, take two tablets daily for three days.  Follow-up Appointments:  Follow-up Information     [name], MD Follow up on 10/22/2021.   Specialty: Pulmonary Disease Contact information: [address] [address] [phone]         Colon Branch, MD. Schedule an appointment as soon as possible for a visit in 1 week(s).   Specialty: Internal Medicine Contact information: [phone] [facility] [place] [address] [phone]         Larey Dresser, MD Follow up on 11/13/2021.   Specialty: Cardiology Contact information: [address] [place] [address] [phone]                 HOSPITAL COURSE:  Patient Summary: Acute on chronic hypoxic and hypercapnic respiratory failure Acute on chronic diastolic CHF Pleural effusions, bilateral, chronic Patient presented with increased dyspnea and wheezing minimally  responsive to home breathing treatments with orthopnea and PND. On presentation he required 15L NRB to maintain O2 saturations >90%. He was able to titrate down to 5L HFNC. Initial work-up revealed flat trops, mildly elevated BNP to 104.2, ABG with pCO2 47 and pH 7.38. Chest xray revealed bilateral bleural effusions, cardiomegaly, and pulmonary vascular congestion. Patient has had evidence of bilateral pleural effusions since operation for aortic aneurysm in 2018.  He was started on Lasix 40 mg IV daily and continued on home Jardiance. Diuresis was advanced to Lasix 80 mg IV 02/19 with improvement of respiratory symptoms. Electrolytes were repleted as necessary.  He bedside thoracentesis 02/21 which was ultimately felt to be transudative in nature and did not have signs of infection or malignancy. CT chest was obtained 02/22 which showed small bilateral pleural effusions, partially loculated, mildly increased since previous exam;  foci of air within the right  pleural effusion and at the right apex consistent with minimal pneumothorax, likely related to thoracentesis 09/30/2021; persistent atelectasis and or infiltrates and scarring in both lungs without obvious pulmonary mass; known thoracic aortic dissection  post stenting.  He was transitioned to torsemide p.o. on 02/22.  Patient is on home oxygen with appropriate saturations at time of discharge.  COPD On initial presentation patient required up to 15 L HFNC but was weaned successfully to 5L HFNC with some fluctuations. Home albuterol, duonebs, and butisol-glycopyrrolate-formoterol treatments were continued.  PT/OT worked with the patient and recommended home health PT/nursing.  Given COPD history and wheezing he was initiated on therapy for possible COPD exacerbation on 02/23 which included prednisone and doxycycline.  Atrial fibrillation on eliquis Patient was in atrial fibrillation with intermittent episodes of rate control which improved with aggressive  diuresis and continuance of home regimen of flecainide and metoprolol. He was continued on eliquis as well.  He returned to sinus rhythm with diuresis.  HLD Chronic condition. Continued home crestor.  CKD stage IIIa Chronic condition.  History of enterococcal endocarditis Patient was maintained on contact cautions throughout this admission.  He had a mild leukocytosis however remained afebrile and hemodynamically stable.  Anxiety Depression Chronic conditions. Continued home lexapro and Xanax as needed.    DISCHARGE INSTRUCTIONS:   Discharge Instructions     Diet - low sodium heart healthy   Complete by: As directed    Discharge instructions   Complete by: As directed    Mr. Neece,  It was a pleasure caring for you during your stay at [facility].  Please finish the course of steroids and antibiotics for your COPD exacerbation as we discussed today: 1 more doxycycline tablet when you get home today with 2 tablets/day for the next 3 days starting tomorrow, and 1 tablet of prednisone each day for the next 3 days starting tomorrow.  Please follow-up with your primary care physician within the next 1 to 2 weeks and keep your follow-up appointments with pulmonology and cardiology.  I encourage you to work with physical therapy to increase your strength as well.  My best,  Dr. Marlou Sa   Increase activity slowly   Complete by: As directed    No wound care   Complete by: As directed        SUBJECTIVE:  Patient evaluated at bedside on day of discharge.  He reports feeling like his breathing is a little bit better and otherwise has no complaints. Discharge Vitals:   BP (!) 120/57    Pulse 79    Temp 98 F (36.7 C) (Oral)    Resp 20    Ht 6' (1.829 m)    Wt 78.1 kg    SpO2 94%    BMI 23.35 kg/m   OBJECTIVE:  Constitutional: Chronically ill-appearing gentleman resting comfortably in bed.  No acute distress. Cardio: Regular rate and rhythm.  Systolic murmur noted,  unchanged. Pulm: Diffuse expiratory wheezing.  Appropriate oxygen saturations on 5 L HFNC. Abdomen: Soft, nontender, nondistended. MSK: Negative for extremity met. Skin: Warm and dry. Neuro: Alert and oriented x3.  No focal deficit noted. Psych: Normal mood and affect.  Pertinent Labs, Studies, and Procedures:  CBC Latest Ref Rng & Units 10/03/2021 10/02/2021 10/01/2021  WBC 4.0 - 10.5 K/uL 13.0(H) 13.7(H) 13.7(H)  Hemoglobin 13.0 - 17.0 g/dL 11.2(L) 12.0(L) 11.7(L)  Hematocrit 39.0 - 52.0 % 35.7(L) 37.4(L) 37.6(L)  Platelets 150 - 400 K/uL 298 295 284    CMP Latest Ref Rng & Units 10/03/2021 10/02/2021 10/01/2021  Glucose 70 - 99 mg/dL 104(H) 109(H) 132(H)  BUN 8 - 23 mg/dL 28(H) 26(H) 24(H)  Creatinine 0.61 - 1.24 mg/dL 1.28(H) 1.34(H) 1.37(H)  Sodium 135 - 145 mmol/L 132(L) 131(L) 132(L)  Potassium 3.5 - 5.1 mmol/L 3.8 3.7 3.6  Chloride 98 - 111 mmol/L 92(L) 90(L) 89(L)  CO2 22 - 32 mmol/L 29 32 33(H)  Calcium 8.9 - 10.3 mg/dL 8.7(L) 8.7(L) 8.8(L)  Total Protein 6.5 - 8.1 g/dL - - -  Total Bilirubin 0.3 - 1.2 mg/dL - - -  Alkaline Phos 38 - 126 U/L - - -  AST 15 - 41 U/L - - -  ALT 0 - 44 U/L - - -    DG CHEST PORT 1 VIEW  Result Date: 09/26/2021 CLINICAL DATA:  Shortness of breath.  History of COPD. EXAM: PORTABLE CHEST 1 VIEW COMPARISON:  08/17/2021 FINDINGS: An aortic stent graft and prosthetic aortic valve are again noted. The cardiac silhouette remains enlarged. Pulmonary vascular congestion is similar to the prior study. Underlying emphysema and scarring are noted. Small to moderate bilateral pleural effusions have mildly enlarged with atelectasis in the lung bases. No pneumothorax is identified. IMPRESSION: 1. Mildly increased size of bilateral pleural effusions. 2. Cardiomegaly and pulmonary vascular congestion. Electronically Signed   By: Logan Bores M.D.   On: 09/26/2021 15:23     Signed: Farrel Gordon, D.O.  Internal Medicine Resident, PGY-1 [facility]  [facility]  Pager: [phone] 1:07 PM, 10/03/2021

## 2021-10-03 NOTE — Plan of Care (Signed)
°  Interdisciplinary Goals of Care Family Meeting   Date carried out:: 10/03/2021  Location of the meeting: Bedside  Member's involved: Physician and Family Member or next of kin  Durable Power of Attorney or acting medical decision maker: Lonell Grandchild, patient    Discussion: We discussed goals of care for [employer] .  Kreig was awake and participated in the discussion.  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  I explained that his risk of re-hospitalization and death in the next 6 months is > 90%.  They say they have never been told this before, so it all seems like a shock.  He wants to continue to live to see his family and wife.  He expressed frustration over being told "all I can do is go home and die."  I explained that there is a middle ground he could take and continue aggressive treatments, doctors visits etc, but draw limitations around life support, CPR etc.  I explained that he would be unlikely to come off life support or survive CPR in the event of a respiratory or cardiac arrest.  I explained the nature of suffering while on life support.    He notes that he needs to think about this more.  His family seems to encourage him to consider a DNR status.  However Shamarcus remained insistant on a full code position and willingness to try life support for 2-3 weeks.  I told him he needs to think about that more, but we went ahead and completed a MOST form documenting his wishes.    He would like to go home today, I feel he is medically approriate for that.  Code status: Full Code  Disposition: Continue current acute care  Time spent for the meeting: 35 minutes  Roselie Awkward 10/03/2021, 11:26 AM

## 2021-10-03 NOTE — Progress Notes (Addendum)
.    Advanced Heart Failure Rounding Note  PCP-Cardiologist: None   Subjective:    S/p thoracentesis. Pleural fluid was exudative. No growth in culture so far.   Rapid response called overnight. O2 sats dropped to 70% on 5L HFNC. Placed on NRB. Stat CXR showed resolution of previously noted tiny right sided PTX and reaccumulation of moderate partially loculated rt pleural effusion. + ILD +atelectasis similar to prior exam.   Improved this morning. O2 sats 95% on 5LHF (home baseline). Looks and feels comfortable. No complaints currently.    Objective:   Weight Range: 78.1 kg Body mass index is 23.35 kg/m.   Vital Signs:   Temp:  [97.7 F (36.5 C)-98.5 F (36.9 C)] 98.5 F (36.9 C) (02/24 0251) Pulse Rate:  [67-99] 67 (02/24 0251) Resp:  [22-31] 22 (02/24 0251) BP: (97-123)/(55-71) 118/59 (02/24 0251) SpO2:  [91 %-96 %] 91 % (02/24 0251) Weight:  [78.1 kg] 78.1 kg (02/24 0251) Last BM Date : 10/02/21  Weight change: Filed Weights   10/01/21 0238 10/02/21 0614 10/03/21 0251  Weight: 75.2 kg 75.2 kg 78.1 kg    Intake/Output:   Intake/Output Summary (Last 24 hours) at 10/03/2021 5916 Last data filed at 10/03/2021 0200 Gross per 24 hour  Intake 837 ml  Output 1100 ml  Net -263 ml      Physical Exam   General:  fatigued appearing. No respiratory difficulty HEENT: normal Neck: supple. JVD 6 cm. Carotids 2+ bilat; no bruits. No lymphadenopathy or thyromegaly appreciated. Cor: PMI nondisplaced. Regular rate & rhythm. No rubs, gallops or murmurs. Lungs: decreased BS at the bases R>L  Abdomen: soft, nontender, nondistended. No hepatosplenomegaly. No bruits or masses. Good bowel sounds. Extremities: no cyanosis, clubbing, rash, edema Neuro: alert & oriented x 3, cranial nerves grossly intact. moves all 4 extremities w/o difficulty. Affect pleasant.   Telemetry   SR 80-90s with occasional PACs and PVCs.   EKG    N/A  Labs    CBC Recent Labs    10/02/21 0408  10/03/21 0605  WBC 13.7* 13.0*  HGB 12.0* 11.2*  HCT 37.4* 35.7*  MCV 82.4 84.0  PLT 295 384   Basic Metabolic Panel Recent Labs    10/02/21 0408 10/03/21 0605  NA 131* 132*  K 3.7 3.8  CL 90* 92*  CO2 32 29  GLUCOSE 109* 104*  BUN 26* 28*  CREATININE 1.34* 1.28*  CALCIUM 8.7* 8.7*   Liver Function Tests Recent Labs    09/30/21 1451  PROT 7.4  ALBUMIN 3.2*   No results for input(s): LIPASE, AMYLASE in the last 72 hours. Cardiac Enzymes No results for input(s): CKTOTAL, CKMB, CKMBINDEX, TROPONINI in the last 72 hours.  BNP: BNP (last 3 results) Recent Labs    08/16/21 0149 09/02/21 1204 09/26/21 1644  BNP 303.9* 26.8 104.2*    ProBNP (last 3 results) No results for input(s): PROBNP in the last 8760 hours.   D-Dimer No results for input(s): DDIMER in the last 72 hours. Hemoglobin A1C No results for input(s): HGBA1C in the last 72 hours. Fasting Lipid Panel No results for input(s): CHOL, HDL, LDLCALC, TRIG, CHOLHDL, LDLDIRECT in the last 72 hours. Thyroid Function Tests No results for input(s): TSH, T4TOTAL, T3FREE, THYROIDAB in the last 72 hours.  Invalid input(s): FREET3  Other results:   Imaging    DG Chest Port 1 View  Result Date: 10/03/2021 CLINICAL DATA:  71 year old male with history of acute respiratory distress. EXAM: PORTABLE CHEST 1 VIEW  COMPARISON:  Chest x-ray 10/01/2021. FINDINGS: Previously noted right-sided pneumothorax is no longer clearly evident. Right-sided pleural effusion has partially reaccumulated, moderate in size, with probable partial loculation in the lateral aspect of the right lung base. Moderate left pleural effusion again noted. Patchy areas of interstitial prominence an ill-defined opacities are again noted throughout the mid to lower lungs bilaterally, similar to the prior study. Pulmonary vasculature is engorged, and there is indistinctness of interstitial markings. Mild cardiomegaly. Upper mediastinal contours are  within normal limits. Status post median sternotomy. Aortic stent graft noted. IMPRESSION: 1. Resolution of previously noted tiny right-sided pneumothorax. Reaccumulation of moderate partially loculated right pleural effusion. 2. The appearance the chest is otherwise very similar to the prior examination, with extensive parenchymal opacities in the lungs which are favored to reflect a combination of interstitial pulmonary edema and widespread areas of rounded atelectasis (based on comparison with prior chest CT 10/01/2021) and chronic scarring. 3. Moderate left pleural effusion, similar to the prior examination. Electronically Signed   By: Vinnie Langton M.D.   On: 10/03/2021 05:34     Medications:     Scheduled Medications:  apixaban  5 mg Oral BID   doxycycline  100 mg Oral Q12H   empagliflozin  10 mg Oral QAC breakfast   escitalopram  10 mg Oral Daily   feeding supplement  237 mL Oral BID BM   flecainide  75 mg Oral BID   fluticasone furoate-vilanterol  1 puff Inhalation Daily   And   umeclidinium bromide  1 puff Inhalation Daily   mouth rinse  15 mL Mouth Rinse BID   metoprolol tartrate  12.5 mg Oral BID   multivitamin with minerals  1 tablet Oral Daily   pantoprazole  40 mg Oral Daily   potassium chloride  40 mEq Oral Daily   predniSONE  20 mg Oral Q breakfast   rosuvastatin  5 mg Oral Daily   torsemide  60 mg Oral Daily    Infusions:  sodium chloride Stopped (09/28/21 1530)    PRN Medications: sodium chloride, acetaminophen **OR** acetaminophen, albuterol, ALPRAZolam, fluticasone, guaiFENesin, ipratropium-albuterol, polyethylene glycol, polyvinyl alcohol, triamcinolone 0.1 % cream : eucerin    Assessment/Plan   1. Acute on chronic hypoxemic respiratory failure: Patient has multifactorial dyspnea at baseline with COPD on home oxygen 5L + OSA with CPAP, diastolic CHF/RV failure, and significant deconditioning. He has had several days of IV Lasix but I/Os not well-recorded.    ?COPD exacerbation.  - S/P thoracentesis. Pleural fluid was exudative. No growth in culture so far.  - Volume status  improved after IV lasix. Continue PO Torsemide 60 mg daily  - Continue treatment for COPD exacerbation with steroids, nebs, abx.   - Symptoms Improving but f/u CXR shows re accumulation of Rt pleural effusion. ? Repeat thoracentesis. ? Pleurex cath  2. Recent Enterococcal bacteremia: 1/23.  Blood Cultures + enterococcus faecalis. Source uncertain. Treated presumptively for endocarditis. Has bioprosthetic AoV. Echo (1/23) w/ mod TR, mild MR. Prosthetic AoV not well visualized due to  poor acoustic windows. TEE (1/23) with no endocarditis.  No fever this admission.  - Completed linezolid course. 3. OSA: Continue CPAP.  4. ?Acute on chronic diastolic CHF with prominent RV dysfunction:  PYP study was not suggestive of ATTR amyloidosis and myeloma panel was negative.   Echo in 1/23 showed EF 55-60% RV moderately enlarged w/ moderately elevated RVSP 55.4 mmHg, RA severely dilated, mod TR, mild MR. Prosthetic AoV not well visualized, poor acoustic windows.  Peak and mean gradients through the valve are 5 and 2 mm Hg respectively. Previous echo in May 2022, peak and mean gradients were 34 and 18 mm Hg respectively. Chronic NYHA IIIb symptoms.  Diuresed w/ IV Lasix. Euvolemic on exam today. As above, ?COPD playing a major role here.  - Volume status stable. Continue torsemide 60 mg daily.  - symptoms improved. No plans for RHC at this point.  - Continue metoprolol 12.5 mg bid. 5. 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 Roxy Manns. Echoes have shown stable bioprosthetic aortic valve.  Dr. Trula Slade has been following his residual dissected aorta.  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 6. Atrial fibrillation: Paroxysmal.  S/p DCCV in 11/21.   - Remains in SR - Continue flecainide + metoprolol. -  Continue Eliquis 5 mg bid.  7. COPD: Moderate to severe obstruction on PFTs, prior smoker.  He is on home oxygen. Followed by pulmonology.  End expiratory wheezing and distant BS.  - As above, continue treatment for COPD exacerbation.  8. Deconditioning.  PT/OT following. Needs HH PT.   Length of Stay: [address] PA-C  10/03/2021, 7:12 AM  Advanced Heart Failure Team Pager [phone] (M-F; 7a - 5p)  Please contact [facility] for night-coverage after hours (5p -7a ) and weekends on [email or web address]  Patient seen with PA, agree with the above note.   Breathing stable today on 5L HFNC.  No complaints.   General: NAD Neck: No JVD, no thyromegaly or thyroid nodule.  Lungs: Distant BS CV: Nondisplaced PMI.  Heart regular S1/S2, no S3/S4, no murmur.  No peripheral edema.   Abdomen: Soft, nontender, no hepatosplenomegaly, no distention.  Skin: Intact without lesions or rashes.  Neurologic: Alert and oriented x 3.  Psych: Normal affect. Extremities: No clubbing or cyanosis.  HEENT: Normal.   Volume stable on exam, continue torsemide 60 mg daily.   Main problem at this point appears to be COPD.  He is now on prednisone per pulmonary, will need taper.   Can go home from my standpoint, needs CHF clinic followup.   [name] 10/03/2021 1:48 PM

## 2021-10-03 NOTE — Significant Event (Signed)
Rapid Response Event Note   Reason for Call :  SpO2-70s on 5L HFNC  Initial Focused Assessment:  Pt lying in bed with eyes open. Pt is mildly SOB. He is alert and oriented. Pt denies chest pain but does c/o some SOB. Lungs diminished t/o. Skin warm and dry.   HR-75, BP-107/52, RR-24, SpO2-86% on 12L HFNC.  Pt placed on NRB-SpO2 increased to 100%. Pt then coughed up moderate amount yellow/greenish colored sputum. Pt was then able to be weaned back to 5L HFNC.   Interventions:  NRB PCXR Plan of Care:  Pt back on HFNC, SpO2-98%. Await PCXR results. Continue to monitor pt. Call RRT if further assistance needed.   Event Summary:   MD Notified: Drs. Lorin Glass and [place] notified and came to bedside Call Time:0501 Arrival OEHO:1224 End Time:0530  Dillard Essex, RN

## 2021-10-03 NOTE — Progress Notes (Signed)
Paged by RN for respiratory distress. Pt noted to be satting in the 70s, rapid was called. Pt transitioned to Bipap and satting 100% with significant clinical improvement. Pt seen at bedside and satting well on NRB. Weaned back down to HFNC and continued to sat well. CXR showed pleural effusions were relatively stable, no evidence of pneumothorax.   Plan: Brief episode of respiratory distress likely 2/2 mucus plug. CTM.

## 2021-10-05 DIAGNOSIS — I130 Hypertensive heart and chronic kidney disease with heart failure and stage 1 through stage 4 chronic kidney disease, or unspecified chronic kidney disease: Secondary | ICD-10-CM | POA: Diagnosis not present

## 2021-10-05 DIAGNOSIS — I272 Pulmonary hypertension, unspecified: Secondary | ICD-10-CM | POA: Diagnosis not present

## 2021-10-05 DIAGNOSIS — Z9181 History of falling: Secondary | ICD-10-CM | POA: Diagnosis not present

## 2021-10-05 DIAGNOSIS — F419 Anxiety disorder, unspecified: Secondary | ICD-10-CM | POA: Diagnosis not present

## 2021-10-05 DIAGNOSIS — G4733 Obstructive sleep apnea (adult) (pediatric): Secondary | ICD-10-CM | POA: Diagnosis not present

## 2021-10-05 DIAGNOSIS — I5033 Acute on chronic diastolic (congestive) heart failure: Secondary | ICD-10-CM | POA: Diagnosis not present

## 2021-10-05 DIAGNOSIS — N400 Enlarged prostate without lower urinary tract symptoms: Secondary | ICD-10-CM | POA: Diagnosis not present

## 2021-10-05 DIAGNOSIS — F32A Depression, unspecified: Secondary | ICD-10-CM | POA: Diagnosis not present

## 2021-10-05 DIAGNOSIS — I7100 Dissection of unspecified site of aorta: Secondary | ICD-10-CM | POA: Diagnosis not present

## 2021-10-05 DIAGNOSIS — J449 Chronic obstructive pulmonary disease, unspecified: Secondary | ICD-10-CM | POA: Diagnosis not present

## 2021-10-05 DIAGNOSIS — I480 Paroxysmal atrial fibrillation: Secondary | ICD-10-CM | POA: Diagnosis not present

## 2021-10-05 DIAGNOSIS — E440 Moderate protein-calorie malnutrition: Secondary | ICD-10-CM | POA: Diagnosis not present

## 2021-10-05 DIAGNOSIS — Z7901 Long term (current) use of anticoagulants: Secondary | ICD-10-CM | POA: Diagnosis not present

## 2021-10-05 DIAGNOSIS — I081 Rheumatic disorders of both mitral and tricuspid valves: Secondary | ICD-10-CM | POA: Diagnosis not present

## 2021-10-05 DIAGNOSIS — N1831 Chronic kidney disease, stage 3a: Secondary | ICD-10-CM | POA: Diagnosis not present

## 2021-10-05 DIAGNOSIS — E785 Hyperlipidemia, unspecified: Secondary | ICD-10-CM | POA: Diagnosis not present

## 2021-10-05 DIAGNOSIS — M1990 Unspecified osteoarthritis, unspecified site: Secondary | ICD-10-CM | POA: Diagnosis not present

## 2021-10-05 DIAGNOSIS — I8390 Asymptomatic varicose veins of unspecified lower extremity: Secondary | ICD-10-CM | POA: Diagnosis not present

## 2021-10-05 DIAGNOSIS — Z9981 Dependence on supplemental oxygen: Secondary | ICD-10-CM | POA: Diagnosis not present

## 2021-10-05 DIAGNOSIS — R630 Anorexia: Secondary | ICD-10-CM | POA: Diagnosis not present

## 2021-10-05 DIAGNOSIS — J9621 Acute and chronic respiratory failure with hypoxia: Secondary | ICD-10-CM | POA: Diagnosis not present

## 2021-10-05 DIAGNOSIS — J90 Pleural effusion, not elsewhere classified: Secondary | ICD-10-CM | POA: Diagnosis not present

## 2021-10-05 DIAGNOSIS — K219 Gastro-esophageal reflux disease without esophagitis: Secondary | ICD-10-CM | POA: Diagnosis not present

## 2021-10-05 DIAGNOSIS — Z7951 Long term (current) use of inhaled steroids: Secondary | ICD-10-CM | POA: Diagnosis not present

## 2021-10-05 DIAGNOSIS — J9622 Acute and chronic respiratory failure with hypercapnia: Secondary | ICD-10-CM | POA: Diagnosis not present

## 2021-10-06 ENCOUNTER — Telehealth: Payer: Self-pay

## 2021-10-06 DIAGNOSIS — J9621 Acute and chronic respiratory failure with hypoxia: Secondary | ICD-10-CM | POA: Diagnosis not present

## 2021-10-06 DIAGNOSIS — I130 Hypertensive heart and chronic kidney disease with heart failure and stage 1 through stage 4 chronic kidney disease, or unspecified chronic kidney disease: Secondary | ICD-10-CM | POA: Diagnosis not present

## 2021-10-06 DIAGNOSIS — N1831 Chronic kidney disease, stage 3a: Secondary | ICD-10-CM | POA: Diagnosis not present

## 2021-10-06 DIAGNOSIS — J9622 Acute and chronic respiratory failure with hypercapnia: Secondary | ICD-10-CM | POA: Diagnosis not present

## 2021-10-06 DIAGNOSIS — I5033 Acute on chronic diastolic (congestive) heart failure: Secondary | ICD-10-CM | POA: Diagnosis not present

## 2021-10-06 DIAGNOSIS — J449 Chronic obstructive pulmonary disease, unspecified: Secondary | ICD-10-CM | POA: Diagnosis not present

## 2021-10-06 NOTE — Telephone Encounter (Signed)
Transition Care Management Follow-up Telephone Call Date of discharge and from where: 10/03/2021-[facility] How have you been since you were released from the hospital? Patient .states-Doing the best I can. Not sleeping well at night Any questions or concerns? No  Items Reviewed: Did the pt receive and understand the discharge instructions provided? Yes  Medications obtained and verified? Yes  Other? Yes  Any new allergies since your discharge? No  Dietary orders reviewed? Yes Do you have support at home? Yes   Home Care and Equipment/Supplies: Were home health services ordered? yes If so, what is the name of the agency? [facility]  Has the agency set up a time to come to the patient's home? yes Were any new equipment or medical supplies ordered?  No What is the name of the medical supply agency? N/a Were you able to get the supplies/equipment? not applicable Do you have any questions related to the use of the equipment or supplies? N/a  Functional Questionnaire: (I = Independent and D = Dependent) ADLs: I with assistance  Bathing/Dressing- I with assistance  Meal Prep- D  Eating- I  Maintaining continence- I with assistance  Transferring/Ambulation- I with assistance  Managing Meds- D  Follow up appointments reviewed:  PCP Hospital f/u appt confirmed? Yes  Scheduled to see Dr. Larose Kells on 10/07/2021 @ 2:00. [facility] f/u appt confirmed? Yes  Scheduled to see Dr. Vaughan Browner on 10/22/2021 @ 10:30 . Are transportation arrangements needed? No  If their condition worsens, is the pt aware to call PCP or go to the Emergency Dept.? Yes Was the patient provided with contact information for the PCP's office or ED? Yes Was to pt encouraged to call back with questions or concerns? Yes

## 2021-10-07 ENCOUNTER — Encounter: Payer: Self-pay | Admitting: Internal Medicine

## 2021-10-07 ENCOUNTER — Ambulatory Visit (INDEPENDENT_AMBULATORY_CARE_PROVIDER_SITE_OTHER): Payer: Medicare Other | Admitting: Internal Medicine

## 2021-10-07 ENCOUNTER — Ambulatory Visit: Payer: Medicare Other | Admitting: Pulmonary Disease

## 2021-10-07 ENCOUNTER — Telehealth: Payer: Self-pay | Admitting: Pulmonary Disease

## 2021-10-07 VITALS — BP 116/70 | HR 88 | Temp 97.6°F | Ht 73.0 in

## 2021-10-07 DIAGNOSIS — Z9889 Other specified postprocedural states: Secondary | ICD-10-CM

## 2021-10-07 DIAGNOSIS — J431 Panlobular emphysema: Secondary | ICD-10-CM

## 2021-10-07 DIAGNOSIS — I5032 Chronic diastolic (congestive) heart failure: Secondary | ICD-10-CM

## 2021-10-07 DIAGNOSIS — F419 Anxiety disorder, unspecified: Secondary | ICD-10-CM | POA: Diagnosis not present

## 2021-10-07 DIAGNOSIS — Z7189 Other specified counseling: Secondary | ICD-10-CM

## 2021-10-07 DIAGNOSIS — J9601 Acute respiratory failure with hypoxia: Secondary | ICD-10-CM | POA: Diagnosis not present

## 2021-10-07 MED ORDER — ALPRAZOLAM 1 MG PO TABS: 1.5000 mg | ORAL_TABLET | Freq: Every evening | ORAL | 0 refills | Status: DC

## 2021-10-07 MED ORDER — PREDNISONE 20 MG PO TABS: 40.0000 mg | ORAL_TABLET | Freq: Every day | ORAL | 0 refills | Status: DC

## 2021-10-07 NOTE — Patient Instructions (Addendum)
Okay to take Xanax: 1.5 tablets at bedtime to help with sleep  Other medications the same  We will refer you to palliative care to help you with your symptoms  Please complete healthcare power of attorney  GO TO THE LAB : Get the blood work     [place], [place] back for a checkup in 4 weeks

## 2021-10-07 NOTE — Assessment & Plan Note (Signed)
Acute on chronic hypoxic and hypercapnic respiratory failure, CHF, COPD advance.  History of recent enterococcal bacteremia. Since the last office visit here he was readmitted to hospital with above problems. He is now home, good compliance with medication. We will check a BMP and CBC Anxiety: The patient is very anxious about his health.  He has not been able to sleep lately until last night when instead of 1 tablet he got 1.5 tablets of Xanax. The patient's wife requesting refill, I sent it, they are aware of the benefits but also of the risk of excessive sedation and possibly hypoxia w/ decreased drive to breath. Goals of care: This was clearly discussed during the hospital admission, see note from pulmonary 10-03-2021. At the time he elected to remain full code We discussed this issue again at length with the wife first, then with the patient-wife-daughter. The patient states he is "not done living". I explained that we will respect his wishes and we will not "abandoned" him if he decides for  no aggressive treatment. I ask about if he would like to be intubated and he said he will leave that up to the family. He would be willing to have a feeding tube for up to 2-3 weeks if he still "has a chance" Options moving forward:status quo, palliative care, hospice care. They are planning to get a healthcare power of attorney today. At the end we agreed to continue present care and refer to palliative care. RTC 4 weeks

## 2021-10-07 NOTE — Progress Notes (Addendum)
Subjective:    Patient ID: Sean Riley, male    DOB: June 18, 1951, 71 y.o.   MRN: [id number]  DOS:  10/07/2021 Type of visit - description: Hospital follow-up, here with his wife and daughter  Was admitted to hospital 09/26/2021 and discharged home a week later. - Acute on chronic hypoxic and hypercapnic respiratory failure, CHF, bilateral chronic pleural effusions. Admitted with DOE, increased wheezing, not responding to outpatient treatment. Treatment including IV Lasix, bedside thoracentesis >>  fluid felt to be transudative. - COPD: On presentation to the hospital he required 15 L high flow oxygen, treatment including albuterol, DuoNeb, prednisone and doxycycline for suspected COPD. - History of recent enterococcal bacteremia.  Since he left the hospital he is taking the medications correctly. Still has episodes of shortness of breath, O2 sat at the time is 92%, the wife increased oxygen to 8 to 9 L and he feels better. No fever chills No lower extremity edema No nausea or vomiting.  No diarrhea He has some cough with a small amount of sputum.  No hemoptysis. Appetite is decreased. He remains very anxious  Review of Systems See above   Past Medical History:  Diagnosis Date   Acute on chronic respiratory failure with hypercapnia (HCC)    Allergy    Arthritis    Ascending aortic dissection 03/23/2017   Atrial fibrillation (HCC)    Cataract    removed both eyes    CHF (congestive heart failure) (HCC)    GERD (gastroesophageal reflux disease)    HYPERLIPIDEMIA    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 LUTS    Hypertension    Microscopic hematuria    negative cystoscopy   NONSPECIFIC ABNORMAL ELECTROCARDIOGRAM    Pleural effusion on right    Pleural effusion, bilateral    S/P aortic dissection repair 03/24/2017   Biological Bentall aortic root replacement + resection and grafting of entire ascending aorta, transverse aortic arch and proximal descending thoracic  aorta with elephant trunk distal anastomosis and debranching of aortic arch vessels   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   25 mm [facility] Ease bovine pericardial tissue valve and 28 mm Gelweave Valsalva aortic root graft with reimplantation of left main and right coronary arteries   Varicose veins     Past Surgical History:  Procedure Laterality Date   APPENDECTOMY     CARDIAC CATHETERIZATION      X3;Dr Caryl Comes, last 01/20/12: mild non-obstructive CAD, normal LV systolic function   CARDIOVERSION N/A 05/31/2018   Procedure: CARDIOVERSION;  Surgeon: Sueanne Margarita, MD;  Location: [facility] ENDOSCOPY;  Service: Cardiovascular;  Laterality: N/A;   CARDIOVERSION N/A 06/13/2020   Procedure: CARDIOVERSION;  Surgeon: Larey Dresser, MD;  Location: [facility] ENDOSCOPY;  Service: Cardiovascular;  Laterality: N/A;   COLONOSCOPY     COLONOSCOPY N/A 01/27/2021   Procedure: COLONOSCOPY;  Surgeon: Gatha Mayer, MD;  Location: [facility] ENDOSCOPY;  Service: Endoscopy;  Laterality: N/A;   CYSTOSCOPY  1978   Dr Hartley Barefoot   ESOPHAGOGASTRODUODENOSCOPY N/A 01/27/2021   Procedure: ESOPHAGOGASTRODUODENOSCOPY (EGD);  Surgeon: Gatha Mayer, MD;  Location: [facility] ENDOSCOPY;  Service: Endoscopy;  Laterality: N/A;   EYE SURGERY     GIVENS CAPSULE STUDY N/A 01/27/2021   Procedure: GIVENS CAPSULE STUDY;  Surgeon: Gatha Mayer, MD;  Location: [place];  Service: Endoscopy;  Laterality: N/A;   IR THORACENTESIS ASP PLEURAL SPACE W/IMG GUIDE  11/25/2017   KNEE ARTHROSCOPY  2012   Dr  [facility]   LUMBAR LAMINECTOMY  1987   Dr Lestine Box  2007   PERICARDIOCENTESIS N/A 04/07/2017   Procedure: PERICARDIOCENTESIS;  Surgeon: Sherren Mocha, MD;  Location: [place] CV LAB;  Service: Cardiovascular;  Laterality: N/A;   POLYPECTOMY     POLYPECTOMY  01/27/2021   Procedure: POLYPECTOMY;  Surgeon: Gatha Mayer, MD;  Location: [facility] ENDOSCOPY;  Service: Endoscopy;;   PRESSURE  SENSOR/CARDIOMEMS N/A 12/26/2020   Procedure: PRESSURE SENSOR/CARDIOMEMS;  Surgeon: Larey Dresser, MD;  Location: [place] CV LAB;  Service: Cardiovascular;  Laterality: N/A;   REPAIR OF ACUTE ASCENDING THORACIC AORTIC DISSECTION N/A 03/23/2017   Procedure: REPAIR OF ACUTE ASCENDING THORACIC AORTIC DISSECTION.  Bentall procedure.  Aortic root repleacement with bioprosthetic valve.  Reimplantation of left and right coronary arteries.  Total resection of transverse aortic arch.  Elephant trunk distal anastomosis and debranching of arch vessels.;  Surgeon: Rexene Alberts, MD;  Location: [place];  Service: Vascular;  Laterality: N/A;   RIGHT HEART CATH N/A 06/15/2019   Procedure: RIGHT HEART CATH;  Surgeon: Larey Dresser, MD;  Location: [place] CV LAB;  Service: Cardiovascular;  Laterality: N/A;   ROTATOR CUFF REPAIR     SHOULDER ARTHROSCOPY  2011   Dr Theda Sers   SHOULDER ARTHROSCOPY Right 08/2015   SPINAL FUSION  1986   Dr Rolin Barry   TEE WITHOUT CARDIOVERSION N/A 03/23/2017   Procedure: TRANSESOPHAGEAL ECHOCARDIOGRAM (TEE);  Surgeon: Rexene Alberts, MD;  Location: [place];  Service: Open Heart Surgery;  Laterality: N/A;   TEE WITHOUT CARDIOVERSION N/A 08/21/2021   Procedure: TRANSESOPHAGEAL ECHOCARDIOGRAM (TEE);  Surgeon: Larey Dresser, MD;  Location: [facility] ENDOSCOPY;  Service: Cardiovascular;  Laterality: N/A;   THORACIC AORTIC ENDOVASCULAR STENT GRAFT N/A 03/30/2018   Procedure: THORACIC AORTIC ENDOVASCULAR STENT GRAFT WITH INTRAVASCULAR ULTRASOUND;  Surgeon: Serafina Mitchell, MD;  Location: [facility] OR;  Service: Vascular;  Laterality: N/A;   TRACHEOSTOMY     age 22 for croup    Current Outpatient Medications  Medication Instructions   albuterol (PROAIR HFA) 108 (90 Base) MCG/ACT inhaler 2 puffs, Inhalation, Every 4 hours PRN   ALPRAZolam (XANAX) 1.5 mg, Oral, Nightly   apixaban (ELIQUIS) 5 mg, Oral, 2 times daily   bisacodyl (DULCOLAX) 5 mg, Oral, Daily PRN   Budeson-Glycopyrrol-Formoterol  (BREZTRI AEROSPHERE) 160-9-4.8 MCG/ACT AERO 2 puffs, Inhalation, 2 times daily   doxycycline (VIBRA-TABS) 100 mg, Oral, Every 12 hours   escitalopram (LEXAPRO) 10 MG tablet TAKE 1 TABLET DAILY   esomeprazole (NEXIUM) 40 mg, Oral, Daily before breakfast   flecainide (TAMBOCOR) 75 mg, Oral, 2 times daily   fluticasone (FLONASE) 50 MCG/ACT nasal spray 1 spray, Each Nare, 2 times daily PRN, Use one spray in each nostril twice daily.   hydroxypropyl methylcellulose / hypromellose (ISOPTO TEARS / GONIOVISC) 2.5 % ophthalmic solution 1-2 drops, Both Eyes, 3 times daily PRN   ipratropium-albuterol (DUONEB) 0.5-2.5 (3) MG/3ML SOLN 3 mLs, Nebulization, Every 4 hours PRN   JARDIANCE 10 MG TABS tablet TAKE 1 TABLET DAILY BEFORE BREAKFAST   magnesium hydroxide (MILK OF MAGNESIA) 400 MG/5ML suspension 7.5 mLs, Oral, Daily PRN   metoprolol tartrate (LOPRESSOR) 12.5 mg, Oral, 2 times daily, Please make yearly appt with Dr. Caryl Comes for January 2022 for future refills. Thank you 1st attempt   OXYGEN 5 L/min, Inhalation, Continuous   potassium chloride SA (KLOR-CON M) 20 MEQ tablet 40 mEq, Oral, Daily at bedtime   predniSONE (DELTASONE) 40 mg, Oral, Daily with  breakfast   rosuvastatin (CRESTOR) 5 mg, Oral, Nightly   torsemide (DEMADEX) 60 mg, Oral, Daily   triamcinolone cream (KENALOG) 0.5 % 1 application, Topical, 3 times daily       Objective:   Physical Exam BP 116/70 (BP Location: Left Arm, Patient Position: Sitting, Cuff Size: Small)    Pulse 88    Temp 97.6 F (36.4 C) (Oral)    Ht 6\' 1"  (1.854 m)    SpO2 93% Comment: 7L/min   BMI 22.72 kg/m  General:   Well developed, on oxygen, looks chronically ill, not toxic, no acute distress.   HEENT:  Normocephalic . Face symmetric, atraumatic Lungs:  Decreased breath sounds bilateral Normal respiratory effort, no intercostal retractions, no accessory muscle use. Heart: RRR, + murmur.  Lower extremities: no pretibial edema bilaterally  Skin: Not pale. Not  jaundice Neurologic:  alert & oriented X3.  Speech normal, gait not tested. Sych--  Cognition and judgment appear intact.  Cooperative with normal attention span and concentration.  Behavior appropriate. Emily anxious.  +     Assessment      Assessment  HTN Hyperlipidemia GERD Insomnia: xanax prn MSK: See surgeries Pulmonary  --COPD, s/p  PFTs 12/2013; 2020: Moderate to severe - Home O2  --Smoker, quit 03/2017 (2PPD) -- OSA , on Cpap Allergies-- Dr Neldon Mc  CV: --P- Atrial fibrillation Dr Caryl Comes, on NSRb --Cardiac catheterization July 2013:  mild nonobstructive CAD   --thoracis Ao  dissection , acute, surgery 03-2017 --Aortic thoracic aneurysm endovascular stent graft 03-2018 GU:  --BPH, microscopic hematuria (negative cystoscopy 1978) --Elevated PSA: saw urology  04-2016 ENDO Hypogonadism-- dx per Dr Loanne Drilling, numbers improved d/t nolvadex  Gynecomastia: ---Dr. Loanne Drilling  >> Bx (-) 10-2014 ; took tamoxifen temporarily (self d/c 06-2016) ---Again 09/2019, labs/MMG done, on [place] Dermatology : eczema? rx taclonex H/o  Tracheostomy , age 29, croup Chronic anemia:  Chronic anemia per chart review,+ iron deficient (08-2018). S/p  colonoscopy 05-2016, multiple polyps. EGD 2014 for chest pain: Negative, bx results? Admitted 01/2021: Hemoglobin of 5.  Work-up negative.  PLAN: Acute on chronic hypoxic and hypercapnic respiratory failure, CHF, COPD advance.  History of recent enterococcal bacteremia. Since the last office visit here he was readmitted to hospital with above problems. He is now home, good compliance with medication. We will check a BMP and CBC Anxiety: The patient is very anxious about his health.  He has not been able to sleep lately until last night when instead of 1 tablet he got 1.5 tablets of Xanax. The patient's wife requesting refill, I sent it, they are aware of the benefits but also of the risk of excessive sedation and possibly hypoxia w/ decreased drive  to breath. Goals of care: This was clearly discussed during the hospital admission, see note from pulmonary 10-03-2021. At the time he elected to remain full code We discussed this issue again at length with the wife first, then with the patient-wife-daughter. The patient states he is "not done living". I explained that we will respect his wishes and we will not "abandoned" him if he decides for  no aggressive treatment. I ask about if he would like to be intubated and he said he will leave that up to the family. He would be willing to have a feeding tube for up to 2-3 weeks if he still "has a chance" Options moving forward:status quo, palliative care, hospice care. They are planning to get a healthcare power of attorney today. At the end we agreed  to continue present care and refer to palliative care. RTC 4 weeks  Time spent 54 minutes due to extensive chart review, lengthy discussion about goals of care, end-of-life. This visit occurred during the SARS-CoV-2 public health emergency.  Safety protocols were in place, including screening questions prior to the visit, additional usage of staff PPE, and extensive cleaning of exam room while observing appropriate contact time as indicated for disinfecting solutions.

## 2021-10-07 NOTE — Telephone Encounter (Signed)
Spoke with the pt's spouse and notified of response per Dr Vaughan Browner. She verbalized understanding and nothing further needed. Rx sent for pred.

## 2021-10-07 NOTE — Telephone Encounter (Signed)
Spoke to Milfay with [facility] stated that patient spo2 has dropped as low at 80% with exertion on 5L. Patient currently wears 5L cont but will titrate up to 7L with exertion as needed.  C/o Sob with exertion and at rest and non prod cough.  Denied f/c/s or additional sx.  Currently taking 20mg  of prednisone, Breztri BID, duoneb QID, and albuterol HFA Q4H.   Dr. Vaughan Browner, please advise. Thanks

## 2021-10-07 NOTE — Telephone Encounter (Signed)
Agree with titrating off oxygen Increase prednisone to 40 mg a day until he can be seen in clinic.

## 2021-10-08 LAB — CBC WITH DIFFERENTIAL/PLATELET
Basophils Absolute: 0 10*3/uL (ref 0.0–0.1)
Basophils Relative: 0.3 % (ref 0.0–3.0)
Eosinophils Absolute: 0.2 10*3/uL (ref 0.0–0.7)
Eosinophils Relative: 1.3 % (ref 0.0–5.0)
HCT: 37.2 % — ABNORMAL LOW (ref 39.0–52.0)
Hemoglobin: 11.6 g/dL — ABNORMAL LOW (ref 13.0–17.0)
Lymphocytes Relative: 2 % — ABNORMAL LOW (ref 12.0–46.0)
Lymphs Abs: 0.3 10*3/uL — ABNORMAL LOW (ref 0.7–4.0)
MCHC: 31.1 g/dL (ref 30.0–36.0)
MCV: 82.6 fl (ref 78.0–100.0)
Monocytes Absolute: 0.8 10*3/uL (ref 0.1–1.0)
Monocytes Relative: 5.5 % (ref 3.0–12.0)
Neutro Abs: 13.3 10*3/uL — ABNORMAL HIGH (ref 1.4–7.7)
Neutrophils Relative %: 90.9 % — ABNORMAL HIGH (ref 43.0–77.0)
Platelets: 375 10*3/uL (ref 150.0–400.0)
RBC: 4.51 Mil/uL (ref 4.22–5.81)
RDW: 19.1 % — ABNORMAL HIGH (ref 11.5–15.5)
WBC: 14.7 10*3/uL — ABNORMAL HIGH (ref 4.0–10.5)

## 2021-10-08 LAB — BASIC METABOLIC PANEL
BUN: 21 mg/dL (ref 6–23)
CO2: 33 mEq/L — ABNORMAL HIGH (ref 19–32)
Calcium: 8.9 mg/dL (ref 8.4–10.5)
Chloride: 92 mEq/L — ABNORMAL LOW (ref 96–112)
Creatinine, Ser: 1.23 mg/dL (ref 0.40–1.50)
GFR: 58.91 mL/min — ABNORMAL LOW (ref >60.00)
Glucose, Bld: 96 mg/dL (ref 70–99)
Potassium: 4.3 mEq/L (ref 3.5–5.1)
Sodium: 132 mEq/L — ABNORMAL LOW (ref 135–145)

## 2021-10-10 ENCOUNTER — Telehealth: Payer: Self-pay | Admitting: Student

## 2021-10-10 ENCOUNTER — Telehealth: Payer: Self-pay | Admitting: Pulmonary Disease

## 2021-10-10 DIAGNOSIS — J449 Chronic obstructive pulmonary disease, unspecified: Secondary | ICD-10-CM | POA: Diagnosis not present

## 2021-10-10 DIAGNOSIS — J9621 Acute and chronic respiratory failure with hypoxia: Secondary | ICD-10-CM | POA: Diagnosis not present

## 2021-10-10 DIAGNOSIS — I5033 Acute on chronic diastolic (congestive) heart failure: Secondary | ICD-10-CM | POA: Diagnosis not present

## 2021-10-10 DIAGNOSIS — I130 Hypertensive heart and chronic kidney disease with heart failure and stage 1 through stage 4 chronic kidney disease, or unspecified chronic kidney disease: Secondary | ICD-10-CM | POA: Diagnosis not present

## 2021-10-10 DIAGNOSIS — J9622 Acute and chronic respiratory failure with hypercapnia: Secondary | ICD-10-CM | POA: Diagnosis not present

## 2021-10-10 DIAGNOSIS — N1831 Chronic kidney disease, stage 3a: Secondary | ICD-10-CM | POA: Diagnosis not present

## 2021-10-10 MED ORDER — AZITHROMYCIN 250 MG PO TABS
250.0000 mg | ORAL_TABLET | ORAL | 0 refills | Status: DC
Start: 2021-10-10 — End: 2021-10-22

## 2021-10-10 NOTE — Telephone Encounter (Signed)
Spoke with patient's wife regarding the Palliative referral/services and after discussion and all questions were answered she was in agreement with scheduling visit.  I have scheduled a MyChart Palliative Video Consult for 10/14/21 @ 1:30 PM ?

## 2021-10-10 NOTE — Telephone Encounter (Signed)
Spoke to patient's wife, [name](DPR).  ?She stated that patient is currently taking 40mg  of prednisone daily, however patient feels that he needs an abx due to persistent sx.  ?C/o prod cough with green sputum , increased sob with exertion and at rest and mild wheezing.  ?Denied f/c/s or additional sx.  ?He wears 5-7L cont. Spo2 is maintaining around 92% on 7L. Spo2 has drop in the low 80's on 7L with exertion.  ?He is using Breztri BID, albuterol TID and duoneb TID.  ? ?Dr. Vaughan Browner, please advise. Thanks ?

## 2021-10-10 NOTE — Telephone Encounter (Signed)
Spoke with the pt's spouse and notified of response per Dr Vaughan Browner. She verbalized understanding. I have sent rx to pharm. Nothing further needed ?

## 2021-10-10 NOTE — Telephone Encounter (Signed)
Please call in z pack 

## 2021-10-13 DIAGNOSIS — I130 Hypertensive heart and chronic kidney disease with heart failure and stage 1 through stage 4 chronic kidney disease, or unspecified chronic kidney disease: Secondary | ICD-10-CM | POA: Diagnosis not present

## 2021-10-13 DIAGNOSIS — J9621 Acute and chronic respiratory failure with hypoxia: Secondary | ICD-10-CM | POA: Diagnosis not present

## 2021-10-13 DIAGNOSIS — I5033 Acute on chronic diastolic (congestive) heart failure: Secondary | ICD-10-CM | POA: Diagnosis not present

## 2021-10-13 DIAGNOSIS — J449 Chronic obstructive pulmonary disease, unspecified: Secondary | ICD-10-CM | POA: Diagnosis not present

## 2021-10-13 DIAGNOSIS — J9622 Acute and chronic respiratory failure with hypercapnia: Secondary | ICD-10-CM | POA: Diagnosis not present

## 2021-10-13 DIAGNOSIS — N1831 Chronic kidney disease, stage 3a: Secondary | ICD-10-CM | POA: Diagnosis not present

## 2021-10-14 ENCOUNTER — Telehealth: Payer: Self-pay | Admitting: Student

## 2021-10-14 ENCOUNTER — Other Ambulatory Visit: Payer: Self-pay

## 2021-10-14 DIAGNOSIS — J9621 Acute and chronic respiratory failure with hypoxia: Secondary | ICD-10-CM

## 2021-10-14 DIAGNOSIS — R531 Weakness: Secondary | ICD-10-CM

## 2021-10-14 DIAGNOSIS — Z515 Encounter for palliative care: Secondary | ICD-10-CM | POA: Diagnosis not present

## 2021-10-14 DIAGNOSIS — R630 Anorexia: Secondary | ICD-10-CM | POA: Diagnosis not present

## 2021-10-14 DIAGNOSIS — F419 Anxiety disorder, unspecified: Secondary | ICD-10-CM | POA: Diagnosis not present

## 2021-10-14 NOTE — Progress Notes (Signed)
[facility] Consult Note Telephone: [phone]  Fax: [phone]   Date of encounter: 10/14/21 4:41 PM PATIENT NAME: Sean Riley [id number]   [phone] (home) [phone] (work) DOB: [birth year] MRN: [id number] PRIMARY CARE PROVIDER:    Colon Branch, MD,  [address] [place] [address] [phone]  REFERRING PROVIDER:   Colon Branch, MD [address] [place],  [place] [address] [phone]  RESPONSIBLE PARTY:    Contact Information     Name Relation Home Work Sean Riley Spouse [phone]  [phone]   Sean Riley Daughter   [phone]   Sean Riley   [phone]        Due to the COVID-19 crisis, this visit was done via telemedicine from my office and it was initiated and consent by this patient and or family.  I connected with  Sean Riley OR PROXY on 10/14/21 by a video enabled telemedicine application and verified that I am speaking with the correct person using two identifiers.   I discussed the limitations of evaluation and management by telemedicine. The patient expressed understanding and agreed to proceed.                                     ASSESSMENT AND PLAN / RECOMMENDATIONS:   Advance Care Planning/Goals of Care: Goals include to maximize quality of life and symptom management. Patient/health care surrogate gave his/her permission to discuss.Our advance care planning conversation included a discussion about:    The value and importance of advance care planning  Experiences with loved ones who have been seriously ill or have died  Exploration of personal, cultural or spiritual beliefs that might influence medical decisions  Exploration of goals of care in the event of a sudden injury or illness  HCPOA-wife and children are secondary Reviewed MOST form; attempt CPR, full scope of treatment, antibiotics and IV fluids as  indicated, No feeding tube.  CODE STATUS: Full Code  Education on palliative medicine vs. Hospice services. Family would like for patient to follow up with pulmonology next week to see what they recommended. Family expresses wanting patient to be comfortable. They also express continuing aggressive treatment at this time; continue therapy as directed. Will recommend patient to hospice should he continue to decline and when family is in agreement.   Symptom Management/Plan:  Acute on chronic respiratory failure- d/t advanced COPD, CHF. Patient with severe shortness of breath. Continue oxygen at 5 lpm, family is increasing to 7 lpm with exertion. Continue albuterol PRN, Duoneb PRN, Breztri, prednisone, furosemide as directed. Patient to follow up with pulmonology next week as scheduled. Continue alprazolam QHS for his anxiety.  Generalized weakness-continue HH therapy as able to tolerate. Recommend w/c for locomotion d/t his weakness and severe shortness of breath.  DME request for light-weight wheel chair.  Patient requires a lightweight wheelchair due to his severe COPD, respiratory failure, CHF, lower extremity weakness. Patient has mobility limitations that prevent him from completing ADL's. He is unable to safely ambulate with a walker. The patient has a caregiver available at all times who is able and willing to assist in propelling the wheelchair.  Decline in appetite, weight loss-continue to offer foods patient enjoys, nutritional supplements BID.    Follow up Palliative Care Visit: Palliative care will continue to follow for complex  medical decision making, advance care planning, and clarification of goals. Return in 2-3 weeks or prn.   This visit was coded based on medical decision making (MDM).  PPS: 40%  HOSPICE ELIGIBILITY/DIAGNOSIS: TBD  Chief Complaint: Palliative Medicine initial consult.   HISTORY OF PRESENT ILLNESS:  Sean Riley is a 71 y.o. year old male  with  acute on chronic respiratory failure, CHF, COPD, hypertension, hyperlipidemia, atrial fibrillation, recent enterococcal bacteremia. Patient hospitalized most recently 2/17-2/24/23 d/t COPD, acute on chronic respiratory failure, acute on chronic diastolic HF, malnutrition, pleural effusion, a-fib. Patient with multiple hospitalizations last year.  Patient reports not feeling well. He endorses shortness of breath with exertion. He is currently wearing oxygen at 5 lpm at rest; wife increases to 7 liters with any exertion. Endorses occasional chest pain, anxiety with exertion. He is receiving PT. Poor appetite; not eating good since January. Does drink nutritional supplements BID. Weight 168, down from 192 pounds December/January. He sleeps in recliner. Patient is needing w/c due to impaired mobility, shortness of breath. A 10-point ROS is negative, except for the pertinent positives and negatives detailed per the HPI.   History obtained from review of EMR, discussion with primary team, and interview with family, facility staff/caregiver and/or Sean Riley.  I reviewed available labs, medications, imaging, studies and related documents from the EMR.  Records reviewed and summarized above.   Physical Exam:  Constitutional: NAD General: frail appearing, ill appearing EYES: anicteric sclera, lids intact, no discharge  ENMT: intact hearing Pulmonary: no increased work of breathing, no cough, oxygen via nasal canula GU: deferred MSK: moves all extremities, ambulatory Skin: no rashes or wounds on visible skin Neuro: +generalized weakness,  no cognitive impairment Psych: non-anxious affect, A and O x 3 Hem/lymph/immuno: no widespread bruising CURRENT PROBLEM LIST:  Patient Active Problem List   Diagnosis Date Noted   Pleural effusion    Malnutrition of moderate degree 09/29/2021   Stage 2 skin ulcer of sacral region (Sean Riley) 09/27/2021   Medication monitoring encounter 09/09/2021   Protein-calorie  malnutrition, severe (Sean Riley) 09/03/2021   Acute on chronic diastolic CHF (congestive heart failure) (Sean Riley)    Pressure injury of skin 76/54/6503   Acute metabolic encephalopathy 54/65/6812   Prosthetic valve endocarditis (Sean Riley)    Enterococcal bacteremia 08/16/2021   Severe sepsis with septic shock (Sean Riley) 08/15/2021   AKI (acute kidney injury) ([facility])    Cellulitis of left ankle 06/23/2021   UTI due to extended-spectrum beta lactamase (ESBL) producing Escherichia coli 01/27/2021   Chronic diastolic CHF (congestive heart failure) ([facility]) 01/27/2021   Acute GI bleeding 01/27/2021   Normocytic anemia 01/24/2021   Acute on chronic respiratory failure with hypercapnia ([facility]) 01/24/2021   GI bleed 01/24/2021   Chest pain 01/24/2021   Leukocytosis 01/24/2021   CKD (chronic kidney disease) 01/24/2021   Acute respiratory failure with hypoxia ([facility]) 09/06/2020   Secondary hypercoagulable state ([facility]) 06/26/2020   Hormonal disorder 10/16/2019   Insomnia 12/29/2018   Chronic anemia 12/29/2018   Atrial tachycardia ([facility])    Thoracic aortic aneurysm without rupture 03/30/2018   COPD (chronic obstructive pulmonary disease) ([facility]) 10/06/2017   Psoriasis 09/14/2017   Depression 04/18/2017   S/P aortic dissection repair 03/24/2017   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   Elevated PSA 01/11/2016   PCP NOTES >>>>>>> 07/24/2015   Gynecomastia, male 10/10/2014   Allergic rhinitis 09/10/2014   Dermatitis 05/11/2014   Annual physical exam 05/10/2014   Erectile dysfunction 05/10/2014   HTN (  hypertension) 10/28/2013   Bronchospasm 10/28/2013   ETOH abuse 10/28/2013   Varicose veins of lower extremities with ulcer ([facility]) 09/15/2011   RBBB 11/14/2008   Hyperlipidemia 03/09/2008   tobacco use d/o 03/09/2008   Atrial fibrillation ([facility]) 03/09/2008   GERD 03/09/2008   Microscopic hematuria 03/09/2008   BPH (benign prostatic hyperplasia) 03/09/2008   PAST MEDICAL HISTORY:  Active  Ambulatory Problems    Diagnosis Date Noted   Hyperlipidemia 03/09/2008   tobacco use d/o 03/09/2008   Atrial fibrillation ([facility]) 03/09/2008   GERD 03/09/2008   Microscopic hematuria 03/09/2008   BPH (benign prostatic hyperplasia) 03/09/2008   RBBB 11/14/2008   Varicose veins of lower extremities with ulcer ([facility]) 09/15/2011   HTN (hypertension) 10/28/2013   Bronchospasm 10/28/2013   ETOH abuse 10/28/2013   Annual physical exam 05/10/2014   Erectile dysfunction 05/10/2014   Dermatitis 05/11/2014   Allergic rhinitis 09/10/2014   Gynecomastia, male 10/10/2014   PCP NOTES >>>>>>> 07/24/2015   Elevated PSA 01/11/2016   S/P aortic dissection repair 03/24/2017   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   Depression 04/18/2017   Psoriasis 09/14/2017   COPD (chronic obstructive pulmonary disease) ([facility]) 10/06/2017   Thoracic aortic aneurysm without rupture 03/30/2018   Atrial tachycardia ([facility])    Insomnia 12/29/2018   Chronic anemia 12/29/2018   Hormonal disorder 10/16/2019   Secondary hypercoagulable state ([facility]) 06/26/2020   Acute respiratory failure with hypoxia ([facility]) 09/06/2020   Normocytic anemia 01/24/2021   Acute on chronic respiratory failure with hypercapnia ([facility]) 01/24/2021   GI bleed 01/24/2021   Chest pain 01/24/2021   Leukocytosis 01/24/2021   CKD (chronic kidney disease) 01/24/2021   UTI due to extended-spectrum beta lactamase (ESBL) producing Escherichia coli 01/27/2021   Chronic diastolic CHF (congestive heart failure) ([facility]) 01/27/2021   Acute GI bleeding 01/27/2021   Cellulitis of left ankle 06/23/2021   Severe sepsis with septic shock ([facility]) 08/15/2021   AKI (acute kidney injury) ([facility])    Enterococcal bacteremia 08/16/2021   Pressure injury of skin 85/27/7824   Acute metabolic encephalopathy 23/53/6144   Prosthetic valve endocarditis ([facility])    Acute on chronic diastolic CHF (congestive heart failure) ([facility])    Protein-calorie  malnutrition, severe ([facility]) 09/03/2021   Medication monitoring encounter 09/09/2021   Stage 2 skin ulcer of sacral region ([facility]) 09/27/2021   Malnutrition of moderate degree 09/29/2021   Pleural effusion    Resolved Ambulatory Problems    Diagnosis Date Noted   SINUSITIS- ACUTE-NOS 08/20/2010   Community acquired pneumonia 10/14/2013   CAP (community acquired pneumonia) 10/14/2013   Hyponatremia 10/14/2013   Pericardial effusion    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   Cataract    CHF (congestive heart failure) ([facility])    GERD (gastroesophageal reflux disease)    HYPERLIPIDEMIA    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 LUTS    Hypertension    NONSPECIFIC ABNORMAL ELECTROCARDIOGRAM    Pleural effusion on right    Pleural effusion, bilateral    Varicose veins    SOCIAL HX:  Social History   Tobacco Use   Smoking status: Former    Packs/day: 2.00    Years: 45.00    Pack years: 90.00    Types: Cigarettes    Quit date: 03/22/2017    Years since quitting: 4.5   Smokeless tobacco: Never   Tobacco comments:    2  ppd , quit 03/2017  Substance Use Topics   Alcohol use: Not Currently   FAMILY HX:  Family History  Problem Relation Age of Onset   Hypertension Mother    Hypertension Father    Benign prostatic hyperplasia Father        S/P TURP   Heart attack Maternal Grandmother        MI in 44s   Breast cancer Maternal Grandmother    Arrhythmia Brother         X 2   Heart attack Maternal Aunt        MI in 51s   Stroke Neg Hx    Diabetes Neg Hx    Colon cancer Neg Hx    Colon polyps Neg Hx    Stomach cancer Neg Hx    Rectal cancer Neg Hx    Esophageal cancer Neg Hx    Prostate cancer Neg Hx    Other Neg Hx        gynecomastia      ALLERGIES:  Allergies  Allergen Reactions   Simvastatin Other (See Comments)    Mental status changes   Celecoxib Other (See Comments)    GI UPSET AND  INFLAMMATION   Codeine Other (See Comments)    GI UPSET AND INFLAMMATION   Nsaids Other (See Comments)    GI UPSET AND INFLAMMATION (can tolerate via IV)   Tape Other (See Comments)    Medical tape and Band-Aids PULL OFF THE SKIN; please use Coban wrap   Cephalexin Itching and Rash   Latex Rash     PERTINENT MEDICATIONS:  Outpatient Encounter Medications as of 10/14/2021  Medication Sig   albuterol (PROAIR HFA) 108 (90 Base) MCG/ACT inhaler Inhale 2 puffs into the lungs every 4 (four) hours as needed for wheezing or shortness of breath.   ALPRAZolam (XANAX) 1 MG tablet Take 1.5 tablets (1.5 mg total) by mouth at bedtime.   apixaban (ELIQUIS) 5 MG TABS tablet Take 1 tablet (5 mg total) by mouth 2 (two) times daily.   azithromycin (ZITHROMAX) 250 MG tablet Take 1 tablet (250 mg total) by mouth as directed.   bisacodyl (DULCOLAX) 5 MG EC tablet Take 5 mg by mouth daily as needed for moderate constipation.   Budeson-Glycopyrrol-Formoterol (BREZTRI AEROSPHERE) 160-9-4.8 MCG/ACT AERO Inhale 2 puffs into the lungs in the morning and at bedtime.   doxycycline (VIBRA-TABS) 100 MG tablet Take 1 tablet (100 mg total) by mouth every 12 (twelve) hours.   escitalopram (LEXAPRO) 10 MG tablet TAKE 1 TABLET DAILY (Patient taking differently: Take 10 mg by mouth daily.)   esomeprazole (NEXIUM) 40 MG capsule Take 1 capsule (40 mg total) by mouth daily before breakfast.   flecainide (TAMBOCOR) 50 MG tablet Take 1.5 tablets (75 mg total) by mouth 2 (two) times daily.   fluticasone (FLONASE) 50 MCG/ACT nasal spray Place 1 spray into both nostrils 2 (two) times daily as needed for allergies. Use one spray in each nostril twice daily.   hydroxypropyl methylcellulose / hypromellose (ISOPTO TEARS / GONIOVISC) 2.5 % ophthalmic solution Place 1-2 drops into both eyes 3 (three) times daily as needed (dry/irritated eyes).   ipratropium-albuterol (DUONEB) 0.5-2.5 (3) MG/3ML SOLN Take 3 mLs by nebulization every 4 (four)  hours as needed. (Patient taking differently: Take 3 mLs by nebulization every 4 (four) hours as needed (shortness of breath).)   JARDIANCE 10 MG TABS tablet TAKE 1 TABLET DAILY BEFORE BREAKFAST (Patient taking differently: Take 10 mg by mouth daily.)  magnesium hydroxide (MILK OF MAGNESIA) 400 MG/5ML suspension Take 7.5 mLs by mouth daily as needed (stomach cramps/constipation).   metoprolol tartrate (LOPRESSOR) 25 MG tablet Take 0.5 tablets (12.5 mg total) by mouth 2 (two) times daily. Please make yearly appt with Dr. Caryl Comes for January 2022 for future refills. Thank you 1st attempt   OXYGEN Inhale 5 L/min into the lungs continuous.   potassium chloride SA (KLOR-CON M) 20 MEQ tablet Take 40 mEq by mouth at bedtime.   predniSONE (DELTASONE) 20 MG tablet Take 2 tablets (40 mg total) by mouth daily with breakfast.   rosuvastatin (CRESTOR) 5 MG tablet Take 5 mg by mouth at bedtime.   torsemide (DEMADEX) 20 MG tablet Take 3 tablets (60 mg total) by mouth daily.   triamcinolone cream (KENALOG) 0.5 % Apply 1 application topically 3 (three) times daily. (Patient taking differently: Apply 1 application topically 3 (three) times daily as needed (rash).)   No facility-administered encounter medications on file as of 10/14/2021.   Thank you for the opportunity to participate in the care of Mr. Purdum.  The palliative care team will continue to follow. Please call our office at [phone] if we can be of additional assistance.   Ezekiel Slocumb, NP   COVID-19 PATIENT SCREENING TOOL Asked and negative response unless otherwise noted:  Have you had symptoms of covid, tested positive or been in contact with someone with symptoms/positive test in the past 5-10 days? No

## 2021-10-15 ENCOUNTER — Encounter: Payer: Self-pay | Admitting: Internal Medicine

## 2021-10-15 ENCOUNTER — Emergency Department (HOSPITAL_COMMUNITY): Payer: Medicare Other

## 2021-10-15 ENCOUNTER — Encounter (HOSPITAL_COMMUNITY): Payer: Self-pay | Admitting: Emergency Medicine

## 2021-10-15 ENCOUNTER — Other Ambulatory Visit: Payer: Self-pay

## 2021-10-15 ENCOUNTER — Telehealth: Payer: Self-pay | Admitting: Internal Medicine

## 2021-10-15 ENCOUNTER — Emergency Department (HOSPITAL_COMMUNITY)
Admission: EM | Admit: 2021-10-15 | Discharge: 2021-10-16 | Disposition: A | Discharge status: Home / Self Care | Payer: Medicare Other | Attending: Emergency Medicine | Admitting: Emergency Medicine

## 2021-10-15 DIAGNOSIS — I509 Heart failure, unspecified: Secondary | ICD-10-CM | POA: Diagnosis not present

## 2021-10-15 DIAGNOSIS — J449 Chronic obstructive pulmonary disease, unspecified: Secondary | ICD-10-CM | POA: Insufficient documentation

## 2021-10-15 DIAGNOSIS — J439 Emphysema, unspecified: Secondary | ICD-10-CM | POA: Diagnosis not present

## 2021-10-15 DIAGNOSIS — Z91040 Latex allergy status: Secondary | ICD-10-CM | POA: Diagnosis not present

## 2021-10-15 DIAGNOSIS — Z7901 Long term (current) use of anticoagulants: Secondary | ICD-10-CM | POA: Diagnosis not present

## 2021-10-15 DIAGNOSIS — J90 Pleural effusion, not elsewhere classified: Secondary | ICD-10-CM | POA: Diagnosis not present

## 2021-10-15 DIAGNOSIS — R739 Hyperglycemia, unspecified: Secondary | ICD-10-CM | POA: Diagnosis not present

## 2021-10-15 DIAGNOSIS — Z20822 Contact with and (suspected) exposure to covid-19: Secondary | ICD-10-CM | POA: Diagnosis not present

## 2021-10-15 DIAGNOSIS — R0602 Shortness of breath: Secondary | ICD-10-CM | POA: Insufficient documentation

## 2021-10-15 DIAGNOSIS — I517 Cardiomegaly: Secondary | ICD-10-CM | POA: Diagnosis not present

## 2021-10-15 DIAGNOSIS — R4702 Dysphasia: Secondary | ICD-10-CM | POA: Diagnosis not present

## 2021-10-15 DIAGNOSIS — Z743 Need for continuous supervision: Secondary | ICD-10-CM | POA: Diagnosis not present

## 2021-10-15 LAB — CBC WITH DIFFERENTIAL/PLATELET
Abs Immature Granulocytes: 0.17 10*3/uL — ABNORMAL HIGH (ref 0.00–0.07)
Basophils Absolute: 0 10*3/uL (ref 0.0–0.1)
Basophils Relative: 0 %
Eosinophils Absolute: 0 10*3/uL (ref 0.0–0.5)
Eosinophils Relative: 0 %
HCT: 39.3 % (ref 39.0–52.0)
Hemoglobin: 12.4 g/dL — ABNORMAL LOW (ref 13.0–17.0)
Immature Granulocytes: 1 %
Lymphocytes Relative: 2 %
Lymphs Abs: 0.3 10*3/uL — ABNORMAL LOW (ref 0.7–4.0)
MCH: 26.4 pg (ref 26.0–34.0)
MCHC: 31.6 g/dL (ref 30.0–36.0)
MCV: 83.6 fL (ref 80.0–100.0)
Monocytes Absolute: 0.7 10*3/uL (ref 0.1–1.0)
Monocytes Relative: 5 %
Neutro Abs: 12.8 10*3/uL — ABNORMAL HIGH (ref 1.7–7.7)
Neutrophils Relative %: 92 %
Platelets: 283 10*3/uL (ref 150–400)
RBC: 4.7 MIL/uL (ref 4.22–5.81)
RDW: 17.4 % — ABNORMAL HIGH (ref 11.5–15.5)
WBC: 14.1 10*3/uL — ABNORMAL HIGH (ref 4.0–10.5)
nRBC: 0 % (ref 0.0–0.2)

## 2021-10-15 LAB — RESP PANEL BY RT-PCR (FLU A&B, COVID) ARPGX2
Influenza A by PCR: NEGATIVE
Influenza B by PCR: NEGATIVE
SARS Coronavirus 2 by RT PCR: NEGATIVE

## 2021-10-15 LAB — COMPREHENSIVE METABOLIC PANEL
ALT: 29 U/L (ref 0–44)
AST: 28 U/L (ref 15–41)
Albumin: 3.4 g/dL — ABNORMAL LOW (ref 3.5–5.0)
Alkaline Phosphatase: 108 U/L (ref 38–126)
Anion gap: 9 (ref 5–15)
BUN: 22 mg/dL (ref 8–23)
CO2: 32 mmol/L (ref 22–32)
Calcium: 8.8 mg/dL — ABNORMAL LOW (ref 8.9–10.3)
Chloride: 92 mmol/L — ABNORMAL LOW (ref 98–111)
Creatinine, Ser: 1.25 mg/dL — ABNORMAL HIGH (ref 0.61–1.24)
GFR, Estimated: >60 mL/min (ref >60)
Glucose, Bld: 136 mg/dL — ABNORMAL HIGH (ref 70–99)
Potassium: 3.4 mmol/L — ABNORMAL LOW (ref 3.5–5.1)
Sodium: 133 mmol/L — ABNORMAL LOW (ref 135–145)
Total Bilirubin: 0.7 mg/dL (ref 0.3–1.2)
Total Protein: 7.7 g/dL (ref 6.5–8.1)

## 2021-10-15 LAB — BRAIN NATRIURETIC PEPTIDE: B Natriuretic Peptide: 49.6 pg/mL (ref 0.0–100.0)

## 2021-10-15 NOTE — Telephone Encounter (Signed)
Spoke w/ Sean Riley- verbal orders given.  ?

## 2021-10-15 NOTE — ED Provider Notes (Signed)
[facility] DEPT Provider Note   CSN: [id number] Arrival date & time: 10/15/21  2202     History  Chief Complaint  Patient presents with   Respiratory Distress   Shortness of Breath    Sean Riley is a 71 y.o. male.  HPI Patient presents for shortness of breath.  His medical history includes atrial fibrillation, COPD, CHF, chronic hypoxia (on 2 to 4 L of supplemental oxygen at baseline).  He was admitted to the hospital and discharged 2 weeks ago.  Home medications include Eliquis, torsemide (60 mg daily), and a daily COPD inhalers.  He also states that he is currently taking prednisone.  He has not been using nebulized breathing treatments at home.  He has been utilizing albuterol inhaler.  Patient reports shortness of breath since 5:30 AM this morning.  Symptoms persisted throughout the day.  For this reason, he presents to the ED.  EMS placed him on 15 L of supplemental oxygen by nonrebreather.  They also gave IV magnesium and IV Solu-Medrol prior to arrival.  History per wife: Patient will have intermittent episodes of worsening shortness of breath.  They seem to occur when he gets "worked up".  When they occur he has a hard time catching his breath.  This episode occurred this evening which prompted a call to EMS.    Home Medications Prior to Admission medications   Medication Sig Start Date End Date Taking? Authorizing Provider  albuterol (PROAIR HFA) 108 (90 Base) MCG/ACT inhaler Inhale 2 puffs into the lungs every 4 (four) hours as needed for wheezing or shortness of breath. 06/09/21   Mannam, Hart Robinsons, MD  ALPRAZolam (XANAX) 1 MG tablet Take 1.5 tablets (1.5 mg total) by mouth at bedtime. 10/07/21   Colon Branch, MD  apixaban (ELIQUIS) 5 MG TABS tablet Take 1 tablet (5 mg total) by mouth 2 (two) times daily. 08/22/21   Shelly Coss, MD  azithromycin (ZITHROMAX) 250 MG tablet Take 1 tablet (250 mg total) by mouth as directed. 10/10/21   Mannam,  Hart Robinsons, MD  bisacodyl (DULCOLAX) 5 MG EC tablet Take 5 mg by mouth daily as needed for moderate constipation.    [provider]  Budeson-Glycopyrrol-Formoterol (BREZTRI AEROSPHERE) 160-9-4.8 MCG/ACT AERO Inhale 2 puffs into the lungs in the morning and at bedtime. 06/09/21   Mannam, Hart Robinsons, MD  doxycycline (VIBRA-TABS) 100 MG tablet Take 1 tablet (100 mg total) by mouth every 12 (twelve) hours. 10/03/21   Farrel Gordon, DO  escitalopram (LEXAPRO) 10 MG tablet TAKE 1 TABLET DAILY Patient taking differently: Take 10 mg by mouth daily. 09/16/21   Colon Branch, MD  esomeprazole (NEXIUM) 40 MG capsule Take 1 capsule (40 mg total) by mouth daily before breakfast. 01/05/17   Kozlow, Donnamarie Poag, MD  flecainide (TAMBOCOR) 50 MG tablet Take 1.5 tablets (75 mg total) by mouth 2 (two) times daily. 08/22/21   Shelly Coss, MD  fluticasone (FLONASE) 50 MCG/ACT nasal spray Place 1 spray into both nostrils 2 (two) times daily as needed for allergies. Use one spray in each nostril twice daily. 05/31/18   Sueanne Margarita, MD  hydroxypropyl methylcellulose / hypromellose (ISOPTO TEARS / GONIOVISC) 2.5 % ophthalmic solution Place 1-2 drops into both eyes 3 (three) times daily as needed (dry/irritated eyes).    [provider]  ipratropium-albuterol (DUONEB) 0.5-2.5 (3) MG/3ML SOLN Take 3 mLs by nebulization every 4 (four) hours as needed. Patient taking differently: Take 3 mLs by nebulization every 4 (four)  hours as needed (shortness of breath). 09/08/21   Parrett, Tammy S, NP  JARDIANCE 10 MG TABS tablet TAKE 1 TABLET DAILY BEFORE BREAKFAST Patient taking differently: Take 10 mg by mouth daily. 04/23/21   Larey Dresser, MD  magnesium hydroxide (MILK OF MAGNESIA) 400 MG/5ML suspension Take 7.5 mLs by mouth daily as needed (stomach cramps/constipation).    [provider]  metoprolol tartrate (LOPRESSOR) 25 MG tablet Take 0.5 tablets (12.5 mg total) by mouth 2 (two) times daily. Please make yearly  appt with Dr. Caryl Comes for January 2022 for future refills. Thank you 1st attempt 09/08/20   Shelly Coss, MD  morphine 20 MG/5ML solution Take 0.6 mLs (2.4 mg total) by mouth every 4 (four) hours as needed for pain (anxiety, dyspnea). 10/16/21   Mannam, Hart Robinsons, MD  OXYGEN Inhale 5 L/min into the lungs continuous.    [provider]  potassium chloride SA (KLOR-CON M) 20 MEQ tablet Take 40 mEq by mouth at bedtime.    [provider]  predniSONE (DELTASONE) 20 MG tablet Take 2 tablets (40 mg total) by mouth daily with breakfast. 10/07/21   Mannam, Praveen, MD  rosuvastatin (CRESTOR) 5 MG tablet Take 5 mg by mouth at bedtime.    [provider]  torsemide (DEMADEX) 20 MG tablet Take 3 tablets (60 mg total) by mouth daily. 08/22/21   Shelly Coss, MD  triamcinolone cream (KENALOG) 0.5 % Apply 1 application topically 3 (three) times daily. Patient taking differently: Apply 1 application topically 3 (three) times daily as needed (rash). 07/17/21   Colon Branch, MD      Allergies    Simvastatin, Celecoxib, Codeine, Nsaids, Tape, Cephalexin, and Latex    Review of Systems   Review of Systems  Respiratory:  Positive for shortness of breath.   All other systems reviewed and are negative.  Physical Exam Updated Vital Signs BP (!) 110/98    Pulse (!) 104    Temp 97.8 F (36.6 C)    Resp 17    SpO2 99%  Physical Exam Vitals and nursing note reviewed.  Constitutional:      General: He is not in acute distress.    Appearance: Normal appearance. He is well-developed and normal weight. He is ill-appearing. He is not toxic-appearing or diaphoretic.  HENT:     Head: Normocephalic and atraumatic.     Right Ear: External ear normal.     Left Ear: External ear normal.     Nose: Nose normal.  Eyes:     Extraocular Movements: Extraocular movements intact.     Conjunctiva/sclera: Conjunctivae normal.  Cardiovascular:     Rate and Rhythm: Normal rate. Rhythm irregular.     Heart  sounds: No murmur heard. Pulmonary:     Effort: Pulmonary effort is normal. No respiratory distress.     Breath sounds: Examination of the right-lower field reveals decreased breath sounds. Examination of the left-lower field reveals decreased breath sounds. Decreased breath sounds present. No wheezing, rhonchi or rales.  Abdominal:     Palpations: Abdomen is soft.     Tenderness: There is no abdominal tenderness.  Musculoskeletal:        General: No swelling. Normal range of motion.     Cervical back: Normal range of motion and neck supple.     Right lower leg: No edema.     Left lower leg: No edema.  Skin:    General: Skin is warm and dry.     Capillary Refill:  Capillary refill takes less than 2 seconds.     Coloration: Skin is not jaundiced or pale.  Neurological:     General: No focal deficit present.     Mental Status: He is alert and oriented to person, place, and time.     Cranial Nerves: No cranial nerve deficit.     Sensory: No sensory deficit.     Motor: No weakness.     Coordination: Coordination normal.  Psychiatric:        Mood and Affect: Mood normal.        Behavior: Behavior normal.        Thought Content: Thought content normal.        Judgment: Judgment normal.    ED Results / Procedures / Treatments   Labs (all labs ordered are listed, but only abnormal results are displayed) Labs Reviewed  CBC WITH DIFFERENTIAL/PLATELET - Abnormal; Notable for the following components:      Result Value   WBC 14.1 (*)    Hemoglobin 12.4 (*)    RDW 17.4 (*)    Neutro Abs 12.8 (*)    Lymphs Abs 0.3 (*)    Abs Immature Granulocytes 0.17 (*)    All other components within normal limits  COMPREHENSIVE METABOLIC PANEL - Abnormal; Notable for the following components:   Sodium 133 (*)    Potassium 3.4 (*)    Chloride 92 (*)    Glucose, Bld 136 (*)    Creatinine, Ser 1.25 (*)    Calcium 8.8 (*)    Albumin 3.4 (*)    All other components within normal limits  RESP PANEL  BY RT-PCR (FLU A&B, COVID) ARPGX2  BRAIN NATRIURETIC PEPTIDE    EKG EKG Interpretation  Date/Time:  Wednesday October 15 2021 22:15:02 EST Ventricular Rate:  98 PR Interval:  170 QRS Duration: 154 QT Interval:  402 QTC Calculation: 514 R Axis:   -88 Text Interpretation: Sinus rhythm 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  Emphysema.  Thoracic aortic stent graft. Cardiomegaly with enlarged central pulmonary vessels. Moderate bilateral loculated pleural effusions with basilar airspace disease, findings do not appear significantly changed. IMPRESSION: 1. Similar small moderate bilateral loculated pleural effusions with airspace disease at the lung bases. 2. Cardiomegaly 3. Emphysema Electronically Signed   By: Donavan Foil M.D.   On: 10/15/2021 22:43    Procedures Procedures    Medications Ordered in ED Medications  ALPRAZolam Duanne Moron) tablet 0.5 mg (0.5 mg Oral Given 10/16/21 0041)    ED Course/ Medical Decision Making/ A&P                           Medical Decision Making Amount and/or Complexity of Data Reviewed Labs: ordered. Radiology: ordered.  Risk Prescription drug management.   This patient presents to the ED for concern of shortness of breat, this involves an extensive number of treatment options, and is a complaint that carries with it a high risk of complications and morbidity.  The differential diagnosis includes COPD exacerbation, CHF exacerbation, pneumonia, PE, anxiety   Co morbidities that complicate the patient evaluation  atrial fibrillation, COPD, CHF, chronic hypoxia (on 2 to 4 L of supplemental oxygen at baseline)   Additional  history obtained:  Additional history  obtained from patient's wife External records from outside source obtained and reviewed including EMR   Lab Tests:  I Ordered, and personally interpreted labs.  The pertinent results include: Baseline leukocytosis, baseline hemoglobin, baseline creatinine   Imaging Studies ordered:  I ordered imaging studies including chest x-ray I independently visualized and interpreted imaging which showed chronic findings only I agree with the radiologist interpretation   Cardiac Monitoring:  The patient was maintained on a cardiac monitor.  I personally viewed and interpreted the cardiac monitored which showed an underlying rhythm of: Atrial fibrillation   Medicines ordered and prescription drug management:  I ordered medication including Xanax for anxiolysis Reevaluation of the patient after these medicines showed that the patient improved I have reviewed the patients home medicines and have made adjustments as needed  Problem List / ED Course:  Patient is a 71 year old male who has COPD, CHF, chronic hypoxia and recent hospitalizations, who presents to the ED for worsening shortness of breath.  EMS placed him on 15 L nonrebreather and gave treatment for COPD exacerbation prior to arrival.  On arrival, patient has good air movement in his lungs and no evidence of wheezing.  He was placed on nasal cannula.  Diagnostic work-up was initiated.  Shortly after arrival, patient was accompanied by his wife.  His wife provides further history.  Episodes of shortness of breath appear to be intermittent.  His wife feels that they are related to the anxiety that is associated with his chronic condition.  Patient was recently placed on Xanax for anxiolysis at night to help him sleep.  He does not take this medication during the day.  His last dose was last night.  Patient underwent work-up which showed no acute findings.  His BNP was at a good level.  He was kept under ED observation and was maintaining normal  SPO2 on his baseline home oxygen.  At times he will endorse worsening shortness of breath.  He was offered BiPAP which he refused.  Shortly thereafter, his breathing was normalized.  I do not think that patient's chronic conditions have worsened.  I do feel that these episodes of shortness of breath are related to anxiety.  Given that he takes Xanax at night, he would be at increasing risk of rebound anxiety throughout the day.  I spoke with him and his wife about this.  Given his reassuring work-up, I do not feel the patient needs hospitalization at this time.  He was given 0.5 mg of Xanax to help with his anxiety.  Patient's wife will plan on a similar dose as needed at home for these episodes.  Patient and wife were encouraged to return at any time for any worsening of symptoms.  At this time they do feel comfortable with discharge home.  Patient was discharged but will remain in the ED while his wife goes home and get his oxygen.  Estimated roundtrip will be 1 hour.    Social Determinants of Health:  Chronically ill with previous recommendations for home hospice.           Final Clinical Impression(s) / ED Diagnoses Final diagnoses:  Shortness of breath    Rx / DC Orders ED Discharge Orders     None         Godfrey Pick, MD 10/17/21 1644

## 2021-10-15 NOTE — ED Triage Notes (Signed)
Patient presents from home with shortness of breath which started yesterday. He woke up around 0530 this am with increased shortness of breath. Nothing, including his inhaler, has helped. He currently is taking prednisone. EMS administered 2g magnesium and '125mg'$  solumedrol. Per EMS, the patient was recently admitted to the hospital and told he only had 6 months to live.  ? ?HX COPD, CHF ? ? ?EMS vitals: ?120/70 BP ?97 HR ?22 RR ?95% SPO2 on 15 L of O2 ?253 CBG ? ? ? ?

## 2021-10-15 NOTE — Progress Notes (Signed)
Pt demanding RT to remove BIPAP mask.  States he wants to try something else.  Pt placed on 6 LPM [place], MD aware.  ?

## 2021-10-15 NOTE — Telephone Encounter (Signed)
[facility] requesting orders for nursing once a week for 9 weeks, and physical therapy 2x for 8 weeks. [phone] ext. [phone] ?

## 2021-10-16 ENCOUNTER — Telehealth: Payer: Self-pay

## 2021-10-16 ENCOUNTER — Telehealth: Payer: Self-pay | Admitting: Internal Medicine

## 2021-10-16 DIAGNOSIS — R0602 Shortness of breath: Secondary | ICD-10-CM | POA: Diagnosis not present

## 2021-10-16 MED ORDER — MORPHINE SULFATE 10 MG/5ML PO SOLN: 2.5000 mg | ORAL | Status: DC | PRN

## 2021-10-16 MED ORDER — ALPRAZOLAM 0.5 MG PO TABS: 0.7500 mg | ORAL_TABLET | Freq: Once | ORAL | Status: DC

## 2021-10-16 MED ORDER — ALPRAZOLAM 0.5 MG PO TABS
0.5000 mg | ORAL_TABLET | Freq: Once | ORAL | Status: AC
Start: 2021-10-16 — End: 2021-10-16
  Administered 2021-10-16: 01:00:00 0.5 mg via ORAL
  Filled 2021-10-16: qty 1

## 2021-10-16 MED ORDER — MORPHINE SULFATE 20 MG/5ML PO SOLN: 2.5000 mg | ORAL | 0 refills | Status: DC | PRN

## 2021-10-16 NOTE — Telephone Encounter (Signed)
Please advise- Pt's family is requesting to change to [facility] in [place] instead of [employer]. Also, would you remain attending?  ? ?

## 2021-10-16 NOTE — Telephone Encounter (Signed)
I called and spoke with the daughter and she voices understanding. She did not have any questions.  

## 2021-10-16 NOTE — Telephone Encounter (Signed)
Caller/Agency: Levada Dy from [facility] ?Callback Number: [phone] ?Requesting OT/PT/Skilled Nursing/Social Work/Speech Therapy: Hospice Admission requested by daughter. ?Frequency:  ?They can take a verbal order or they can be faxed to: [phone] ? ?She would also like to know if Dr. Larose Kells would want to maintain medical management or give over management to hospice physician? ?

## 2021-10-16 NOTE — Telephone Encounter (Signed)
We can try morphine. This is not for hospice as we need to talk about it at clinic visit but it can help with the feeling of shortness of breath and anxiety that he has. ? ?I called in a prescription to his pharmacy  ?

## 2021-10-16 NOTE — Addendum Note (Signed)
Addended byMarshell Garfinkel on: 10/16/2021 01:14 PM ? ? Modules accepted: Orders ? ?

## 2021-10-16 NOTE — Telephone Encounter (Signed)
Plan of care signed and faxed back to [facility] at [phone]. Form sent for scanning.  ?

## 2021-10-16 NOTE — Telephone Encounter (Signed)
I called and spoke with the pt's daughter, [name]. She states pt ended back up in the ED yesterday with increased SOB. He was sent home and advised not much can be done for him. Hospice has been discussed with pt and the family, and at first they were on board for this but then this morning pt said he wanted to wait until ov with Dr Vaughan Browner on 10/22/21 to discuss other possible options first. Daughter concerned bc he is so uncomfortable and is struggling a great deal with his breathing. She says at this point abx and pred do not help. He gets very anxious when can not catch his breath. Dr Larose Kells prescribed xanax 1 mg- hospital advised him to take 1/2 in the afternoon and 1/2 at night. He had some sleep last night after taking 1/2 tablet, but she wants to know if there is something he can use during the day to help with the breathlessness and anxiety. Dr Vaughan Browner, please advise any recommendations. Thank you! ?

## 2021-10-16 NOTE — Telephone Encounter (Signed)
Okay to both requests ?

## 2021-10-16 NOTE — Telephone Encounter (Signed)
150 pm.  Message received to contact daughter Sean Riley.  Return call made and Sean Riley explains patient was seen in the ED yesterday and discharged home around 3 am.  Patient was advised that nothing more can be done to aide in his respiratory status and he should move towards comfort care.  Sean Riley spoke with her mother about this earlier today.  Originally, she wanted to move towards hospice and a request was sent to Dr. Larose Kells and orders received for [facility].  Since this time, Sean Riley advised her mother has changed her mind and wants patient to see pulmonology next week and then make a decision.  We discussed hospice support and services provided.  Sean Riley shared they were able to keep her father-in-law home under [facility] and she is familiar with services.   At this time, they are on hold with hospice and will see pulmonology next week and make a decision on how to proceed.  I advised Sean Riley that Palliative Care could remain involved in patient's care until hospice was called in.  Sean Riley advised she would contact us next week if they choose hospice.  Update provided to Sean [facility], NP. ?

## 2021-10-16 NOTE — Telephone Encounter (Signed)
Spoke w/ Levada Dy- verbal orders given.  ?

## 2021-10-17 ENCOUNTER — Telehealth: Payer: Self-pay | Admitting: Pulmonary Disease

## 2021-10-17 ENCOUNTER — Encounter (HOSPITAL_COMMUNITY): Payer: Medicare Other

## 2021-10-17 ENCOUNTER — Telehealth: Payer: Self-pay | Admitting: Internal Medicine

## 2021-10-17 MED ORDER — MORPHINE SULFATE 20 MG/5ML PO SOLN: 2.5000 mg | ORAL | 0 refills | Status: DC | PRN

## 2021-10-17 NOTE — Telephone Encounter (Signed)
[facility] MS [facility] [address] [facility] not [facility]  ?

## 2021-10-17 NOTE — Telephone Encounter (Signed)
Family requesting refill for MS be [facility] to [facility] > done  ?

## 2021-10-17 NOTE — Telephone Encounter (Signed)
Dr. Mannam, can you please advise? Thanks!  

## 2021-10-17 NOTE — Telephone Encounter (Signed)
[facility] called [facility] and spoke with the pharmacist. She stated that they did receive the RX for the morphine '20mg'$ /59m but they do not have it in stock. When they called earlier they had the '10mg'$ /514mbut it is now gone. The only morphine they have in stock now is the '100mg'$ /67m21m ? ?I checked [facility] and Dr. ManVaughan Browner off today.  ? ?SarJudson Rochan you please advise? Thanks!  ?

## 2021-10-17 NOTE — Telephone Encounter (Signed)
See MyChart encounter. Will close encounter.  ?

## 2021-10-21 NOTE — Telephone Encounter (Signed)
I called and discussed with [name], daughter. They were able to get it filled at a different pharmacy. Nothing further needed.  ?

## 2021-10-22 ENCOUNTER — Ambulatory Visit (INDEPENDENT_AMBULATORY_CARE_PROVIDER_SITE_OTHER): Payer: Medicare Other | Admitting: Pulmonary Disease

## 2021-10-22 ENCOUNTER — Other Ambulatory Visit: Payer: Self-pay

## 2021-10-22 ENCOUNTER — Encounter: Payer: Self-pay | Admitting: Pulmonary Disease

## 2021-10-22 VITALS — BP 110/56 | HR 69 | Temp 97.6°F | Ht 73.0 in | Wt 168.8 lb

## 2021-10-22 DIAGNOSIS — R0600 Dyspnea, unspecified: Secondary | ICD-10-CM

## 2021-10-22 DIAGNOSIS — J9621 Acute and chronic respiratory failure with hypoxia: Secondary | ICD-10-CM | POA: Diagnosis not present

## 2021-10-22 DIAGNOSIS — J449 Chronic obstructive pulmonary disease, unspecified: Secondary | ICD-10-CM | POA: Diagnosis not present

## 2021-10-22 MED ORDER — BUDESONIDE 0.5 MG/2ML IN SUSP: 0.5000 mg | Freq: Every day | RESPIRATORY_TRACT | 11 refills | Status: AC

## 2021-10-22 MED ORDER — YUPELRI 175 MCG/3ML IN SOLN: 175.0000 ug | Freq: Every day | RESPIRATORY_TRACT | 11 refills | Status: DC

## 2021-10-22 MED ORDER — ARFORMOTEROL TARTRATE 15 MCG/2ML IN NEBU: 15.0000 ug | INHALATION_SOLUTION | Freq: Two times a day (BID) | RESPIRATORY_TRACT | 11 refills | Status: DC

## 2021-10-22 NOTE — Patient Instructions (Signed)
We will stop the breztri and change you to Brovana, Pulmicort and Yupelri nebs ?Continue prednisone at 20 mg ?We will make referral to hospice, palliative care ?Return to clinic in 1 month.  Video visit ok ?

## 2021-10-22 NOTE — Progress Notes (Addendum)
? ?      ?[name]    [id number]    [birth year] ? ?Primary Care Physician:Paz, Alda Berthold, MD ? ?Referring Physician: Colon Branch, MD ?[address] ?[address] ?[place],  [place] [address] ? ?Chief complaint: Follow-up for COPD, pleural effusions ? 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 Followed with Dr. Roxy Manns.  CT scan in January showed large bilateral effusion.  He was started on Lasix with improvement in effusion ? ?He has followed up with Dr. Ricard Dillon and underwent thoracentesis of the right pleural effusion on 11/25/17.  I do not see any pleural studies sent from the procedure. Underwent endovascular repair of thoracic aortic aneurysm on 03/30/2018 without any respiratory issue ? ?Hospitalized in June 2022 for symptomatic GI bleed with anemia.  Underwent EGD, colonoscopy and capsule study with no bleeding site identified.   ? ?Has been unable to tolerate breo, incruse and trelegy due to hoarseness of voice ?He was started on Dulera inhaler but unable to continue due to insurance issues.  Started on breztri inhaler ?He was seen by Dr. Aundra Dubin on 04/09/2021 and diuretic dose increased for volume overload.  He still continues to be symptomatic ? ?Pets: Dog, no birds, [place]. ?Occupation: Retired Museum/gallery curator. ?Exposures: No known exposures, no mold, he has a hot tub but does not use it ?Smoking history: 90-pack-year smoking history.  Quit in 2018 ?Travel History: Not significant ? ?Interim history: ?[name] continues to decline this respiratory status.  He was admitted in February 2023 with acute on chronic hypoxic and hypercapnic respiratory failure, CHF, COPD exacerbation.  He had thoracentesis of his pleural effusion and treated with Lasix, prednisone and antibiotics with little improvement.  He was seen by [facility] service and palliative care consulted.  Hospice  has been recommended to him but he is reluctant to take this step. ? ?He also had a ED visit on 10/15/2021 but continues to struggle with minimal exertion ?Has been prescribed morphine from our office over the weekend. ? ?Outpatient Encounter Medications as of 10/22/2021  ?Medication Sig  ? albuterol (PROAIR HFA) 108 (90 Base) MCG/ACT inhaler Inhale 2 puffs into the lungs every 4 (four) hours as needed for wheezing or shortness of breath.  ? ALPRAZolam (XANAX) 1 MG tablet Take 1.5 tablets (1.5 mg total) by mouth at bedtime.  ? apixaban (ELIQUIS) 5 MG TABS tablet Take 1 tablet (5 mg total) by mouth 2 (two) times daily.  ? bisacodyl (DULCOLAX) 5 MG EC tablet Take 5 mg by mouth daily as needed for moderate constipation.  ? Budeson-Glycopyrrol-Formoterol (BREZTRI AEROSPHERE) 160-9-4.8 MCG/ACT AERO Inhale 2 puffs into the lungs in the morning and at bedtime.  ? doxycycline (VIBRA-TABS) 100 MG tablet Take 1 tablet (100 mg total) by mouth every 12 (twelve) hours.  ? escitalopram (LEXAPRO) 10 MG tablet TAKE 1 TABLET DAILY (Patient taking differently: Take 10 mg by mouth daily.)  ? esomeprazole (NEXIUM) 40 MG capsule Take 1 capsule (40 mg total) by mouth daily before breakfast.  ? flecainide (TAMBOCOR) 50 MG tablet Take 1.5 tablets (75 mg total) by mouth 2 (two) times daily.  ? fluticasone (FLONASE) 50 MCG/ACT nasal spray Place 1 spray into both nostrils 2 (two) times daily as needed for allergies. Use one spray in each nostril twice daily.  ? hydroxypropyl methylcellulose / hypromellose (ISOPTO TEARS / GONIOVISC) 2.5 % ophthalmic solution  Place 1-2 drops into both eyes 3 (three) times daily as needed (dry/irritated eyes).  ? ipratropium-albuterol (DUONEB) 0.5-2.5 (3) MG/3ML SOLN Take 3 mLs by nebulization every 4 (four) hours as needed. (Patient taking differently: Take 3 mLs by nebulization every 4 (four) hours as needed (shortness of breath).)  ? JARDIANCE 10 MG TABS tablet TAKE 1 TABLET DAILY BEFORE BREAKFAST (Patient  taking differently: Take 10 mg by mouth daily.)  ? magnesium hydroxide (MILK OF MAGNESIA) 400 MG/5ML suspension Take 7.5 mLs by mouth daily as needed (stomach cramps/constipation).  ? metoprolol tartrate (LOPRESSOR) 25 MG tablet Take 0.5 tablets (12.5 mg total) by mouth 2 (two) times daily. Please make yearly appt with Dr. Caryl Comes for January 2022 for future refills. Thank you 1st attempt  ? morphine 20 MG/5ML solution Take 0.6 mLs (2.4 mg total) by mouth every 4 (four) hours as needed for pain (anxiety, dyspnea).  ? OXYGEN Inhale 5 L/min into the lungs continuous.  ? potassium chloride SA (KLOR-CON M) 20 MEQ tablet Take 40 mEq by mouth at bedtime.  ? predniSONE (DELTASONE) 20 MG tablet Take 2 tablets (40 mg total) by mouth daily with breakfast.  ? rosuvastatin (CRESTOR) 5 MG tablet Take 5 mg by mouth at bedtime.  ? torsemide (DEMADEX) 20 MG tablet Take 3 tablets (60 mg total) by mouth daily.  ? triamcinolone cream (KENALOG) 0.5 % Apply 1 application topically 3 (three) times daily. (Patient taking differently: Apply 1 application. topically 3 (three) times daily as needed (rash).)  ? [DISCONTINUED] azithromycin (ZITHROMAX) 250 MG tablet Take 1 tablet (250 mg total) by mouth as directed.  ? ?No facility-administered encounter medications on file as of 10/22/2021.  ? ?Physical Exam: ?Blood pressure (!) 110/56, pulse 69, temperature 97.6 ?F (36.4 ?C), temperature source Oral, height '6\' 1"'$  (1.854 m), weight 168 lb 12.8 oz (76.6 kg), SpO2 95 %. ?Gen:      No acute distress ?HEENT:  EOMI, sclera anicteric ?Neck:     No masses; no thyromegaly ?Lungs:    Clear to auscultation bilaterally; normal respiratory effort ?CV:         Regular rate and rhythm; no murmurs ?Abd:      + bowel sounds; soft, non-tender; no palpable masses, no distension ?Ext:    No edema; adequate peripheral perfusion ?Skin:      Warm and dry; no rash ?Neuro: alert and oriented x 3 ?Psych: normal mood and affect  ? ?Data Reviewed: ?Imaging ?CT 03/22/17-  moderate emphysema, left upper lobe 3.6 mm nodule. ? ?CT angio 08/23/17-emphysema, large-moderate right and left effusion. ? ?Screening CT 10/11/2019-lobular emphysema, calcified granuloma in the right upper lobe.  Small partially loculated bilateral effusions with scarring. ? ?CTA 01/29/2020-stable emphysema, bilateral effusions ? ?Screening CT chest 11/13/2020-small stable pulmonary nodules, atelectasis, small bilateral effusions, emphysema. ? ?CT abdomen pelvis 01/29/2021-increase in size of effusions. ? ?CT chest 10/01/2021-small bilateral pleural effusion, minimal pneumothorax. ?I have reviewed the images personally. ? ?PFTs ?01/05/17-FVC 4.02 [80%], FEV1 3.03 [81%], F/F 75 ?12/24/15-FVC 4.27 [91%], FEV1 2.39 [86%], F/F 79 ?Normal spirometry ? ?12/25/13 ?FVC 4.96 [103%], FEV1 3.51 [97%], F/F 71, p.o. 314%, DLCO 72% ?Minimal obstruction, small airway disease, minimal diffusion defect ? ?01/25/2018 ?FVC 2.92 (62%], FEV1 1.98 [57%], F/F 68, TLC 66%, DLCO 32% ?Moderate-severe obstruction, severe restriction and diffusion defect. ? ?Labs ?Blood allergy profile 01/25/2018- IgE 841, sensitive to grass, cat, dog, tree pollen ?CBC 01/25/2018- WBC 6.7, eos 4.8%, absolute eosinophil count 300 ?Alpha-1 antitrypsin 01/25/2018- 166, PI MM ? ?Assessment:  ?  COPD ?PFTs reviewed with moderate-severe obstruction ?Has not tolerated multiple inhalers in the past due to hoarseness ?Currently on breztri inhaler.  With his worsening breathing I will change him to Brovana, Pulmicort and Yupelri nebs ? ?Suspect dyspnea is multifactorial from COPD, CHF with pleural effusions, atrial fibrillation.  Low suspicion for PE as he is already on anticoagulation ? ? ?Pleural effusions ?CT scan reviewed with bilateral effusions that are partially loculated with some scarring ?These effusions are present since aortic surgery in 2018.  Suspect this is postoperative fluid collections which are chronic now.  Also may have a component of CHF secondary to chronic  diastolic heart failure, paroxysmal atrial fibrillation. ?No evidence of interstitial lung disease. ?Continue monitoring ? ?OSA ?On CPAP. ? ?Goals of care ?We had her extensive discussion with Vasilios, his wife Ro

## 2021-10-23 ENCOUNTER — Encounter: Payer: Self-pay | Admitting: Pulmonary Disease

## 2021-10-23 DIAGNOSIS — F32A Depression, unspecified: Secondary | ICD-10-CM | POA: Diagnosis not present

## 2021-10-23 DIAGNOSIS — I10 Essential (primary) hypertension: Secondary | ICD-10-CM | POA: Diagnosis not present

## 2021-10-23 DIAGNOSIS — J449 Chronic obstructive pulmonary disease, unspecified: Secondary | ICD-10-CM | POA: Diagnosis not present

## 2021-10-23 DIAGNOSIS — I5032 Chronic diastolic (congestive) heart failure: Secondary | ICD-10-CM | POA: Diagnosis not present

## 2021-10-23 DIAGNOSIS — E43 Unspecified severe protein-calorie malnutrition: Secondary | ICD-10-CM | POA: Diagnosis not present

## 2021-10-23 DIAGNOSIS — I4891 Unspecified atrial fibrillation: Secondary | ICD-10-CM | POA: Diagnosis not present

## 2021-10-23 MED ORDER — PREDNISONE 20 MG PO TABS: 20.0000 mg | ORAL_TABLET | Freq: Every day | ORAL | 1 refills | Status: DC

## 2021-10-23 NOTE — Telephone Encounter (Signed)
Called and spoke with patient's wife [name]. She confirmed that the patient is taking '20mg'$  once daily and not '40mg'$  once daily. She is aware that I have sent the RX to [facility] in [place].  ? ?Nothing further needed at time.  ?

## 2021-10-24 ENCOUNTER — Telehealth: Payer: Self-pay | Admitting: Student

## 2021-10-24 NOTE — Telephone Encounter (Signed)
Palliative NP called to confirm appointment for Monday. Wife states patient has been admitted to [facility]; will d/c from palliative services.  ?

## 2021-10-27 ENCOUNTER — Encounter: Payer: Self-pay | Admitting: Internal Medicine

## 2021-10-27 ENCOUNTER — Other Ambulatory Visit: Payer: Self-pay | Admitting: Pulmonary Disease

## 2021-10-27 DIAGNOSIS — I4891 Unspecified atrial fibrillation: Secondary | ICD-10-CM | POA: Diagnosis not present

## 2021-10-27 DIAGNOSIS — I5032 Chronic diastolic (congestive) heart failure: Secondary | ICD-10-CM | POA: Diagnosis not present

## 2021-10-27 DIAGNOSIS — F32A Depression, unspecified: Secondary | ICD-10-CM | POA: Diagnosis not present

## 2021-10-27 DIAGNOSIS — I10 Essential (primary) hypertension: Secondary | ICD-10-CM | POA: Diagnosis not present

## 2021-10-27 DIAGNOSIS — E43 Unspecified severe protein-calorie malnutrition: Secondary | ICD-10-CM | POA: Diagnosis not present

## 2021-10-27 DIAGNOSIS — J449 Chronic obstructive pulmonary disease, unspecified: Secondary | ICD-10-CM | POA: Diagnosis not present

## 2021-10-30 DIAGNOSIS — E43 Unspecified severe protein-calorie malnutrition: Secondary | ICD-10-CM | POA: Diagnosis not present

## 2021-10-30 DIAGNOSIS — I10 Essential (primary) hypertension: Secondary | ICD-10-CM | POA: Diagnosis not present

## 2021-10-30 DIAGNOSIS — F32A Depression, unspecified: Secondary | ICD-10-CM | POA: Diagnosis not present

## 2021-10-30 DIAGNOSIS — I5032 Chronic diastolic (congestive) heart failure: Secondary | ICD-10-CM | POA: Diagnosis not present

## 2021-10-30 DIAGNOSIS — J449 Chronic obstructive pulmonary disease, unspecified: Secondary | ICD-10-CM | POA: Diagnosis not present

## 2021-10-30 DIAGNOSIS — I4891 Unspecified atrial fibrillation: Secondary | ICD-10-CM | POA: Diagnosis not present

## 2021-11-03 ENCOUNTER — Ambulatory Visit: Payer: Medicare Other | Admitting: Internal Medicine

## 2021-11-03 DIAGNOSIS — I4891 Unspecified atrial fibrillation: Secondary | ICD-10-CM | POA: Diagnosis not present

## 2021-11-03 DIAGNOSIS — I5032 Chronic diastolic (congestive) heart failure: Secondary | ICD-10-CM | POA: Diagnosis not present

## 2021-11-03 DIAGNOSIS — I10 Essential (primary) hypertension: Secondary | ICD-10-CM | POA: Diagnosis not present

## 2021-11-03 DIAGNOSIS — F32A Depression, unspecified: Secondary | ICD-10-CM | POA: Diagnosis not present

## 2021-11-03 DIAGNOSIS — J449 Chronic obstructive pulmonary disease, unspecified: Secondary | ICD-10-CM | POA: Diagnosis not present

## 2021-11-03 DIAGNOSIS — E43 Unspecified severe protein-calorie malnutrition: Secondary | ICD-10-CM | POA: Diagnosis not present

## 2021-11-04 ENCOUNTER — Encounter (HOSPITAL_COMMUNITY): Payer: Self-pay | Admitting: Cardiology

## 2021-11-04 DIAGNOSIS — I5032 Chronic diastolic (congestive) heart failure: Secondary | ICD-10-CM | POA: Diagnosis not present

## 2021-11-04 DIAGNOSIS — E43 Unspecified severe protein-calorie malnutrition: Secondary | ICD-10-CM | POA: Diagnosis not present

## 2021-11-04 DIAGNOSIS — I10 Essential (primary) hypertension: Secondary | ICD-10-CM | POA: Diagnosis not present

## 2021-11-04 DIAGNOSIS — F32A Depression, unspecified: Secondary | ICD-10-CM | POA: Diagnosis not present

## 2021-11-04 DIAGNOSIS — I4891 Unspecified atrial fibrillation: Secondary | ICD-10-CM | POA: Diagnosis not present

## 2021-11-04 DIAGNOSIS — J449 Chronic obstructive pulmonary disease, unspecified: Secondary | ICD-10-CM | POA: Diagnosis not present

## 2021-11-05 DIAGNOSIS — Z20822 Contact with and (suspected) exposure to covid-19: Secondary | ICD-10-CM | POA: Diagnosis not present

## 2021-11-08 DIAGNOSIS — E43 Unspecified severe protein-calorie malnutrition: Secondary | ICD-10-CM | POA: Diagnosis not present

## 2021-11-08 DIAGNOSIS — I10 Essential (primary) hypertension: Secondary | ICD-10-CM | POA: Diagnosis not present

## 2021-11-08 DIAGNOSIS — I4891 Unspecified atrial fibrillation: Secondary | ICD-10-CM | POA: Diagnosis not present

## 2021-11-08 DIAGNOSIS — I5032 Chronic diastolic (congestive) heart failure: Secondary | ICD-10-CM | POA: Diagnosis not present

## 2021-11-08 DIAGNOSIS — F32A Depression, unspecified: Secondary | ICD-10-CM | POA: Diagnosis not present

## 2021-11-08 DIAGNOSIS — J449 Chronic obstructive pulmonary disease, unspecified: Secondary | ICD-10-CM | POA: Diagnosis not present

## 2021-11-10 ENCOUNTER — Encounter: Payer: Self-pay | Admitting: Pulmonary Disease

## 2021-11-10 ENCOUNTER — Other Ambulatory Visit (HOSPITAL_COMMUNITY): Payer: Self-pay

## 2021-11-10 NOTE — Telephone Encounter (Signed)
Mychart message sent by pt's daughter: ?Can Dr Vaughan Browner continue to prescribe the liquid morphine for Dad?  Hospice will only do the pills because he's still full code. The morphine does help him relax.  They said the liquid would be better than the pills.  ? ? ? ? ?Dr. Vaughan Browner, please advise on this. ?

## 2021-11-11 ENCOUNTER — Telehealth (HOSPITAL_COMMUNITY): Payer: Self-pay | Admitting: *Deleted

## 2021-11-11 DIAGNOSIS — I4891 Unspecified atrial fibrillation: Secondary | ICD-10-CM | POA: Diagnosis not present

## 2021-11-11 DIAGNOSIS — J449 Chronic obstructive pulmonary disease, unspecified: Secondary | ICD-10-CM | POA: Diagnosis not present

## 2021-11-11 DIAGNOSIS — F32A Depression, unspecified: Secondary | ICD-10-CM | POA: Diagnosis not present

## 2021-11-11 DIAGNOSIS — I5032 Chronic diastolic (congestive) heart failure: Secondary | ICD-10-CM | POA: Diagnosis not present

## 2021-11-11 DIAGNOSIS — E43 Unspecified severe protein-calorie malnutrition: Secondary | ICD-10-CM | POA: Diagnosis not present

## 2021-11-11 DIAGNOSIS — I10 Essential (primary) hypertension: Secondary | ICD-10-CM | POA: Diagnosis not present

## 2021-11-11 MED ORDER — MORPHINE SULFATE 20 MG/5ML PO SOLN: 2.5000 mg | ORAL | 0 refills | Status: DC | PRN

## 2021-11-11 NOTE — Telephone Encounter (Signed)
Pts wife requested return call directly from [place]. She did not want to give details she just asked for Dr.McLean to call her at  [phone]. She did say it was concerning his appt she had to cancel due to the amount of O2 he is on.  ?

## 2021-11-13 ENCOUNTER — Encounter (HOSPITAL_COMMUNITY): Payer: Medicare Other | Admitting: Cardiology

## 2021-11-14 DIAGNOSIS — I10 Essential (primary) hypertension: Secondary | ICD-10-CM | POA: Diagnosis not present

## 2021-11-14 DIAGNOSIS — F32A Depression, unspecified: Secondary | ICD-10-CM | POA: Diagnosis not present

## 2021-11-14 DIAGNOSIS — E43 Unspecified severe protein-calorie malnutrition: Secondary | ICD-10-CM | POA: Diagnosis not present

## 2021-11-14 DIAGNOSIS — I4891 Unspecified atrial fibrillation: Secondary | ICD-10-CM | POA: Diagnosis not present

## 2021-11-14 DIAGNOSIS — J449 Chronic obstructive pulmonary disease, unspecified: Secondary | ICD-10-CM | POA: Diagnosis not present

## 2021-11-14 DIAGNOSIS — I5032 Chronic diastolic (congestive) heart failure: Secondary | ICD-10-CM | POA: Diagnosis not present

## 2021-11-17 DIAGNOSIS — I5032 Chronic diastolic (congestive) heart failure: Secondary | ICD-10-CM | POA: Diagnosis not present

## 2021-11-17 DIAGNOSIS — J449 Chronic obstructive pulmonary disease, unspecified: Secondary | ICD-10-CM | POA: Diagnosis not present

## 2021-11-17 DIAGNOSIS — I4891 Unspecified atrial fibrillation: Secondary | ICD-10-CM | POA: Diagnosis not present

## 2021-11-17 DIAGNOSIS — I10 Essential (primary) hypertension: Secondary | ICD-10-CM | POA: Diagnosis not present

## 2021-11-17 DIAGNOSIS — F32A Depression, unspecified: Secondary | ICD-10-CM | POA: Diagnosis not present

## 2021-11-17 DIAGNOSIS — E43 Unspecified severe protein-calorie malnutrition: Secondary | ICD-10-CM | POA: Diagnosis not present

## 2021-11-18 ENCOUNTER — Other Ambulatory Visit (HOSPITAL_COMMUNITY): Payer: Self-pay | Admitting: Cardiology

## 2021-11-18 DIAGNOSIS — F32A Depression, unspecified: Secondary | ICD-10-CM | POA: Diagnosis not present

## 2021-11-18 DIAGNOSIS — J449 Chronic obstructive pulmonary disease, unspecified: Secondary | ICD-10-CM | POA: Diagnosis not present

## 2021-11-18 DIAGNOSIS — I4891 Unspecified atrial fibrillation: Secondary | ICD-10-CM | POA: Diagnosis not present

## 2021-11-18 DIAGNOSIS — I5032 Chronic diastolic (congestive) heart failure: Secondary | ICD-10-CM | POA: Diagnosis not present

## 2021-11-18 DIAGNOSIS — I10 Essential (primary) hypertension: Secondary | ICD-10-CM | POA: Diagnosis not present

## 2021-11-18 DIAGNOSIS — E43 Unspecified severe protein-calorie malnutrition: Secondary | ICD-10-CM | POA: Diagnosis not present

## 2021-11-20 ENCOUNTER — Emergency Department (HOSPITAL_COMMUNITY): Payer: Medicare Other

## 2021-11-20 ENCOUNTER — Encounter (HOSPITAL_COMMUNITY): Payer: Self-pay

## 2021-11-20 ENCOUNTER — Other Ambulatory Visit: Payer: Self-pay

## 2021-11-20 ENCOUNTER — Inpatient Hospital Stay (HOSPITAL_COMMUNITY)
Admission: EM | Admit: 2021-11-20 | Discharge: 2021-11-24 | DRG: 190 | Disposition: A | Discharge status: Home / Self Care | Payer: Medicare Other | Attending: Internal Medicine | Admitting: Internal Medicine

## 2021-11-20 DIAGNOSIS — I10 Essential (primary) hypertension: Secondary | ICD-10-CM | POA: Diagnosis present

## 2021-11-20 DIAGNOSIS — Z9842 Cataract extraction status, left eye: Secondary | ICD-10-CM

## 2021-11-20 DIAGNOSIS — J441 Chronic obstructive pulmonary disease with (acute) exacerbation: Secondary | ICD-10-CM | POA: Diagnosis not present

## 2021-11-20 DIAGNOSIS — Z8249 Family history of ischemic heart disease and other diseases of the circulatory system: Secondary | ICD-10-CM

## 2021-11-20 DIAGNOSIS — Z7189 Other specified counseling: Secondary | ICD-10-CM

## 2021-11-20 DIAGNOSIS — I499 Cardiac arrhythmia, unspecified: Secondary | ICD-10-CM | POA: Diagnosis not present

## 2021-11-20 DIAGNOSIS — I4891 Unspecified atrial fibrillation: Secondary | ICD-10-CM | POA: Diagnosis not present

## 2021-11-20 DIAGNOSIS — I5032 Chronic diastolic (congestive) heart failure: Secondary | ICD-10-CM | POA: Diagnosis not present

## 2021-11-20 DIAGNOSIS — J449 Chronic obstructive pulmonary disease, unspecified: Secondary | ICD-10-CM | POA: Diagnosis not present

## 2021-11-20 DIAGNOSIS — I110 Hypertensive heart disease with heart failure: Secondary | ICD-10-CM | POA: Diagnosis present

## 2021-11-20 DIAGNOSIS — R918 Other nonspecific abnormal finding of lung field: Secondary | ICD-10-CM | POA: Diagnosis not present

## 2021-11-20 DIAGNOSIS — K219 Gastro-esophageal reflux disease without esophagitis: Secondary | ICD-10-CM | POA: Diagnosis present

## 2021-11-20 DIAGNOSIS — Z66 Do not resuscitate: Secondary | ICD-10-CM | POA: Diagnosis not present

## 2021-11-20 DIAGNOSIS — Z888 Allergy status to other drugs, medicaments and biological substances status: Secondary | ICD-10-CM

## 2021-11-20 DIAGNOSIS — I5033 Acute on chronic diastolic (congestive) heart failure: Secondary | ICD-10-CM | POA: Diagnosis not present

## 2021-11-20 DIAGNOSIS — J431 Panlobular emphysema: Secondary | ICD-10-CM | POA: Diagnosis not present

## 2021-11-20 DIAGNOSIS — E876 Hypokalemia: Secondary | ICD-10-CM | POA: Diagnosis present

## 2021-11-20 DIAGNOSIS — I482 Chronic atrial fibrillation, unspecified: Secondary | ICD-10-CM | POA: Diagnosis not present

## 2021-11-20 DIAGNOSIS — R6520 Severe sepsis without septic shock: Secondary | ICD-10-CM | POA: Diagnosis present

## 2021-11-20 DIAGNOSIS — Z515 Encounter for palliative care: Secondary | ICD-10-CM | POA: Diagnosis not present

## 2021-11-20 DIAGNOSIS — R54 Age-related physical debility: Secondary | ICD-10-CM | POA: Diagnosis present

## 2021-11-20 DIAGNOSIS — J90 Pleural effusion, not elsewhere classified: Secondary | ICD-10-CM | POA: Diagnosis not present

## 2021-11-20 DIAGNOSIS — Z8679 Personal history of other diseases of the circulatory system: Secondary | ICD-10-CM | POA: Diagnosis not present

## 2021-11-20 DIAGNOSIS — Z885 Allergy status to narcotic agent status: Secondary | ICD-10-CM

## 2021-11-20 DIAGNOSIS — Z886 Allergy status to analgesic agent status: Secondary | ICD-10-CM

## 2021-11-20 DIAGNOSIS — Z9841 Cataract extraction status, right eye: Secondary | ICD-10-CM

## 2021-11-20 DIAGNOSIS — J440 Chronic obstructive pulmonary disease with acute lower respiratory infection: Secondary | ICD-10-CM | POA: Diagnosis not present

## 2021-11-20 DIAGNOSIS — Z9981 Dependence on supplemental oxygen: Secondary | ICD-10-CM | POA: Diagnosis not present

## 2021-11-20 DIAGNOSIS — Z79899 Other long term (current) drug therapy: Secondary | ICD-10-CM

## 2021-11-20 DIAGNOSIS — E785 Hyperlipidemia, unspecified: Secondary | ICD-10-CM | POA: Diagnosis present

## 2021-11-20 DIAGNOSIS — A419 Sepsis, unspecified organism: Secondary | ICD-10-CM | POA: Diagnosis present

## 2021-11-20 DIAGNOSIS — J8489 Other specified interstitial pulmonary diseases: Secondary | ICD-10-CM | POA: Diagnosis not present

## 2021-11-20 DIAGNOSIS — J690 Pneumonitis due to inhalation of food and vomit: Secondary | ICD-10-CM | POA: Diagnosis not present

## 2021-11-20 DIAGNOSIS — Z91040 Latex allergy status: Secondary | ICD-10-CM

## 2021-11-20 DIAGNOSIS — Z9049 Acquired absence of other specified parts of digestive tract: Secondary | ICD-10-CM

## 2021-11-20 DIAGNOSIS — Z743 Need for continuous supervision: Secondary | ICD-10-CM | POA: Diagnosis not present

## 2021-11-20 DIAGNOSIS — D62 Acute posthemorrhagic anemia: Secondary | ICD-10-CM | POA: Diagnosis present

## 2021-11-20 DIAGNOSIS — Z981 Arthrodesis status: Secondary | ICD-10-CM

## 2021-11-20 DIAGNOSIS — R079 Chest pain, unspecified: Secondary | ICD-10-CM | POA: Diagnosis not present

## 2021-11-20 DIAGNOSIS — Z7901 Long term (current) use of anticoagulants: Secondary | ICD-10-CM

## 2021-11-20 DIAGNOSIS — J9622 Acute and chronic respiratory failure with hypercapnia: Secondary | ICD-10-CM | POA: Diagnosis present

## 2021-11-20 DIAGNOSIS — E871 Hypo-osmolality and hyponatremia: Secondary | ICD-10-CM | POA: Diagnosis present

## 2021-11-20 DIAGNOSIS — Z20822 Contact with and (suspected) exposure to covid-19: Secondary | ICD-10-CM | POA: Diagnosis present

## 2021-11-20 DIAGNOSIS — Z7984 Long term (current) use of oral hypoglycemic drugs: Secondary | ICD-10-CM

## 2021-11-20 DIAGNOSIS — J9621 Acute and chronic respiratory failure with hypoxia: Secondary | ICD-10-CM | POA: Diagnosis present

## 2021-11-20 DIAGNOSIS — J9811 Atelectasis: Secondary | ICD-10-CM | POA: Diagnosis not present

## 2021-11-20 DIAGNOSIS — E43 Unspecified severe protein-calorie malnutrition: Secondary | ICD-10-CM | POA: Diagnosis not present

## 2021-11-20 DIAGNOSIS — I517 Cardiomegaly: Secondary | ICD-10-CM | POA: Diagnosis not present

## 2021-11-20 DIAGNOSIS — J189 Pneumonia, unspecified organism: Secondary | ICD-10-CM | POA: Diagnosis present

## 2021-11-20 DIAGNOSIS — J9601 Acute respiratory failure with hypoxia: Principal | ICD-10-CM

## 2021-11-20 DIAGNOSIS — Z952 Presence of prosthetic heart valve: Secondary | ICD-10-CM | POA: Diagnosis not present

## 2021-11-20 DIAGNOSIS — Z7952 Long term (current) use of systemic steroids: Secondary | ICD-10-CM

## 2021-11-20 DIAGNOSIS — R0603 Acute respiratory distress: Secondary | ICD-10-CM | POA: Diagnosis not present

## 2021-11-20 DIAGNOSIS — I38 Endocarditis, valve unspecified: Secondary | ICD-10-CM | POA: Diagnosis not present

## 2021-11-20 DIAGNOSIS — Z91048 Other nonmedicinal substance allergy status: Secondary | ICD-10-CM

## 2021-11-20 DIAGNOSIS — F32A Depression, unspecified: Secondary | ICD-10-CM | POA: Diagnosis not present

## 2021-11-20 DIAGNOSIS — R000 Tachycardia, unspecified: Secondary | ICD-10-CM | POA: Diagnosis not present

## 2021-11-20 DIAGNOSIS — R6889 Other general symptoms and signs: Secondary | ICD-10-CM | POA: Diagnosis not present

## 2021-11-20 DIAGNOSIS — Z87891 Personal history of nicotine dependence: Secondary | ICD-10-CM

## 2021-11-20 DIAGNOSIS — R0902 Hypoxemia: Secondary | ICD-10-CM | POA: Diagnosis not present

## 2021-11-20 DIAGNOSIS — Z7951 Long term (current) use of inhaled steroids: Secondary | ICD-10-CM

## 2021-11-20 LAB — BRAIN NATRIURETIC PEPTIDE: B Natriuretic Peptide: 114.8 pg/mL — ABNORMAL HIGH (ref 0.0–100.0)

## 2021-11-20 LAB — I-STAT ARTERIAL BLOOD GAS, ED
Acid-Base Excess: 9 mmol/L — ABNORMAL HIGH (ref 0.0–2.0)
Bicarbonate: 34.4 mmol/L — ABNORMAL HIGH (ref 20.0–28.0)
Calcium, Ion: 1.07 mmol/L — ABNORMAL LOW (ref 1.15–1.40)
HCT: 34 % — ABNORMAL LOW (ref 39.0–52.0)
Hemoglobin: 11.6 g/dL — ABNORMAL LOW (ref 13.0–17.0)
O2 Saturation: 95 %
Patient temperature: 97.8
Potassium: 3.7 mmol/L (ref 3.5–5.1)
Sodium: 136 mmol/L (ref 135–145)
TCO2: 36 mmol/L — ABNORMAL HIGH (ref 22–32)
pCO2 arterial: 50.3 mmHg — ABNORMAL HIGH (ref 32–48)
pH, Arterial: 7.442 (ref 7.35–7.45)
pO2, Arterial: 70 mmHg — ABNORMAL LOW (ref 83–108)

## 2021-11-20 LAB — BLOOD CULTURE ID PANEL (REFLEXED) - BCID2

## 2021-11-20 LAB — RESP PANEL BY RT-PCR (FLU A&B, COVID) ARPGX2
Influenza A by PCR: NEGATIVE
Influenza B by PCR: NEGATIVE
SARS Coronavirus 2 by RT PCR: NEGATIVE

## 2021-11-20 LAB — CBC WITH DIFFERENTIAL/PLATELET
Abs Immature Granulocytes: 0.2 10*3/uL — ABNORMAL HIGH (ref 0.00–0.07)
Basophils Absolute: 0 10*3/uL (ref 0.0–0.1)
Basophils Relative: 0 %
Eosinophils Absolute: 0 10*3/uL (ref 0.0–0.5)
Eosinophils Relative: 0 %
HCT: 36.6 % — ABNORMAL LOW (ref 39.0–52.0)
Hemoglobin: 11.6 g/dL — ABNORMAL LOW (ref 13.0–17.0)
Immature Granulocytes: 1 %
Lymphocytes Relative: 1 %
Lymphs Abs: 0.2 10*3/uL — ABNORMAL LOW (ref 0.7–4.0)
MCH: 27.1 pg (ref 26.0–34.0)
MCHC: 31.7 g/dL (ref 30.0–36.0)
MCV: 85.5 fL (ref 80.0–100.0)
Monocytes Absolute: 1.1 10*3/uL — ABNORMAL HIGH (ref 0.1–1.0)
Monocytes Relative: 6 %
Neutro Abs: 16.9 10*3/uL — ABNORMAL HIGH (ref 1.7–7.7)
Neutrophils Relative %: 92 %
Platelets: 267 10*3/uL (ref 150–400)
RBC: 4.28 MIL/uL (ref 4.22–5.81)
RDW: 17 % — ABNORMAL HIGH (ref 11.5–15.5)
WBC: 18.5 10*3/uL — ABNORMAL HIGH (ref 4.0–10.5)
nRBC: 0 % (ref 0.0–0.2)

## 2021-11-20 LAB — COMPREHENSIVE METABOLIC PANEL
ALT: 14 U/L (ref 0–44)
AST: 20 U/L (ref 15–41)
Albumin: 3.1 g/dL — ABNORMAL LOW (ref 3.5–5.0)
Alkaline Phosphatase: 77 U/L (ref 38–126)
Anion gap: 8 (ref 5–15)
BUN: 17 mg/dL (ref 8–23)
CO2: 34 mmol/L — ABNORMAL HIGH (ref 22–32)
Calcium: 8.5 mg/dL — ABNORMAL LOW (ref 8.9–10.3)
Chloride: 91 mmol/L — ABNORMAL LOW (ref 98–111)
Creatinine, Ser: 1.5 mg/dL — ABNORMAL HIGH (ref 0.61–1.24)
GFR, Estimated: 49 mL/min — ABNORMAL LOW (ref >60)
Glucose, Bld: 212 mg/dL — ABNORMAL HIGH (ref 70–99)
Potassium: 3.6 mmol/L (ref 3.5–5.1)
Sodium: 133 mmol/L — ABNORMAL LOW (ref 135–145)
Total Bilirubin: 1.3 mg/dL — ABNORMAL HIGH (ref 0.3–1.2)
Total Protein: 6.5 g/dL (ref 6.5–8.1)

## 2021-11-20 LAB — PROTIME-INR
INR: 1.5 — ABNORMAL HIGH (ref 0.8–1.2)
INR: 1.4 — ABNORMAL HIGH (ref 0.8–1.2)
Prothrombin Time: 18.3 seconds — ABNORMAL HIGH (ref 11.4–15.2)
Prothrombin Time: 17.4 seconds — ABNORMAL HIGH (ref 11.4–15.2)

## 2021-11-20 LAB — HEMOGLOBIN AND HEMATOCRIT, BLOOD
HCT: 34.1 % — ABNORMAL LOW (ref 39.0–52.0)
Hemoglobin: 10.9 g/dL — ABNORMAL LOW (ref 13.0–17.0)

## 2021-11-20 LAB — PROCALCITONIN: Procalcitonin: 0.51 ng/mL

## 2021-11-20 LAB — TROPONIN I (HIGH SENSITIVITY)
Troponin I (High Sensitivity): 96 ng/L — ABNORMAL HIGH (ref <18)
Troponin I (High Sensitivity): 104 ng/L (ref <18)

## 2021-11-20 LAB — APTT: aPTT: 39 seconds — ABNORMAL HIGH (ref 24–36)

## 2021-11-20 LAB — LACTIC ACID, PLASMA: Lactic Acid, Venous: 1.4 mmol/L (ref 0.5–1.9)

## 2021-11-20 MED ORDER — APIXABAN 5 MG PO TABS
5.0000 mg | ORAL_TABLET | Freq: Two times a day (BID) | ORAL | Status: DC
  Administered 2021-11-20: 5 mg via ORAL
  Filled 2021-11-20: qty 1

## 2021-11-20 MED ORDER — MORPHINE SULFATE (CONCENTRATE) 10 MG/0.5ML PO SOLN
2.5000 mg | ORAL | Status: DC | PRN
  Administered 2021-11-21 – 2021-11-24 (×7): 2.6 mg via ORAL
  Filled 2021-11-20 (×9): qty 0.5

## 2021-11-20 MED ORDER — ACETAMINOPHEN 650 MG RE SUPP: 650.0000 mg | Freq: Four times a day (QID) | RECTAL | Status: DC | PRN

## 2021-11-20 MED ORDER — ALPRAZOLAM 0.5 MG PO TABS
1.5000 mg | ORAL_TABLET | Freq: Every day | ORAL | Status: DC
  Administered 2021-11-20 – 2021-11-23 (×4): 1.5 mg via ORAL
  Filled 2021-11-20 (×4): qty 3

## 2021-11-20 MED ORDER — METOPROLOL TARTRATE 12.5 MG HALF TABLET
12.5000 mg | ORAL_TABLET | Freq: Two times a day (BID) | ORAL | Status: DC
  Administered 2021-11-20 – 2021-11-24 (×8): 12.5 mg via ORAL
  Filled 2021-11-20 (×8): qty 1

## 2021-11-20 MED ORDER — BUDESONIDE 0.5 MG/2ML IN SUSP
0.5000 mg | Freq: Every day | RESPIRATORY_TRACT | Status: DC
  Administered 2021-11-20 – 2021-11-24 (×5): 0.5 mg via RESPIRATORY_TRACT
  Filled 2021-11-20 (×6): qty 2

## 2021-11-20 MED ORDER — SODIUM CHLORIDE 0.9 % IV SOLN
500.0000 mg | INTRAVENOUS | Status: DC
  Administered 2021-11-20 – 2021-11-22 (×3): 500 mg via INTRAVENOUS
  Filled 2021-11-20 (×3): qty 5

## 2021-11-20 MED ORDER — DOCUSATE SODIUM 100 MG PO CAPS
100.0000 mg | ORAL_CAPSULE | Freq: Two times a day (BID) | ORAL | Status: DC
  Administered 2021-11-20 – 2021-11-24 (×8): 100 mg via ORAL
  Filled 2021-11-20 (×9): qty 1

## 2021-11-20 MED ORDER — REVEFENACIN 175 MCG/3ML IN SOLN
175.0000 ug | Freq: Every day | RESPIRATORY_TRACT | Status: DC
  Administered 2021-11-20 – 2021-11-24 (×5): 175 ug via RESPIRATORY_TRACT
  Filled 2021-11-20 (×5): qty 3

## 2021-11-20 MED ORDER — SODIUM CHLORIDE 0.9% FLUSH
3.0000 mL | Freq: Two times a day (BID) | INTRAVENOUS | Status: DC
  Administered 2021-11-20 – 2021-11-24 (×7): 3 mL via INTRAVENOUS

## 2021-11-20 MED ORDER — ACETAMINOPHEN 325 MG PO TABS: 650.0000 mg | ORAL_TABLET | Freq: Four times a day (QID) | ORAL | Status: DC | PRN

## 2021-11-20 MED ORDER — BISACODYL 5 MG PO TBEC
5.0000 mg | DELAYED_RELEASE_TABLET | Freq: Every day | ORAL | Status: DC | PRN
  Administered 2021-11-23: 5 mg via ORAL
  Filled 2021-11-20: qty 1

## 2021-11-20 MED ORDER — METHYLPREDNISOLONE SODIUM SUCC 125 MG IJ SOLR: 125.0000 mg | Freq: Once | INTRAMUSCULAR | Status: DC

## 2021-11-20 MED ORDER — IPRATROPIUM-ALBUTEROL 0.5-2.5 (3) MG/3ML IN SOLN
3.0000 mL | Freq: Once | RESPIRATORY_TRACT | Status: AC
  Administered 2021-11-20: 3 mL via RESPIRATORY_TRACT
  Filled 2021-11-20: qty 3

## 2021-11-20 MED ORDER — ARFORMOTEROL TARTRATE 15 MCG/2ML IN NEBU
15.0000 ug | INHALATION_SOLUTION | Freq: Two times a day (BID) | RESPIRATORY_TRACT | Status: DC
  Administered 2021-11-20 – 2021-11-24 (×9): 15 ug via RESPIRATORY_TRACT
  Filled 2021-11-20 (×9): qty 2

## 2021-11-20 MED ORDER — SODIUM CHLORIDE 0.9 % IV SOLN: INTRAVENOUS | Status: DC

## 2021-11-20 MED ORDER — POLYETHYLENE GLYCOL 3350 17 G PO PACK
17.0000 g | PACK | Freq: Every day | ORAL | Status: DC | PRN
  Administered 2021-11-23: 17 g via ORAL
  Filled 2021-11-20: qty 1

## 2021-11-20 MED ORDER — HYPROMELLOSE (GONIOSCOPIC) 2.5 % OP SOLN: 1.0000 [drp] | Freq: Three times a day (TID) | OPHTHALMIC | Status: DC | PRN

## 2021-11-20 MED ORDER — PANTOPRAZOLE SODIUM 40 MG PO TBEC
40.0000 mg | DELAYED_RELEASE_TABLET | Freq: Every day | ORAL | Status: DC
  Administered 2021-11-20 – 2021-11-24 (×5): 40 mg via ORAL
  Filled 2021-11-20 (×5): qty 1

## 2021-11-20 MED ORDER — SODIUM CHLORIDE 0.9 % IV SOLN
2.0000 g | INTRAVENOUS | Status: DC
  Filled 2021-11-20: qty 20

## 2021-11-20 MED ORDER — ONDANSETRON HCL 4 MG PO TABS: 4.0000 mg | ORAL_TABLET | Freq: Four times a day (QID) | ORAL | Status: DC | PRN

## 2021-11-20 MED ORDER — SODIUM CHLORIDE 0.9 % IV BOLUS
1000.0000 mL | Freq: Once | INTRAVENOUS | Status: AC
  Administered 2021-11-20: 1000 mL via INTRAVENOUS

## 2021-11-20 MED ORDER — ESCITALOPRAM OXALATE 10 MG PO TABS
10.0000 mg | ORAL_TABLET | Freq: Every day | ORAL | Status: DC
  Administered 2021-11-20 – 2021-11-24 (×5): 10 mg via ORAL
  Filled 2021-11-20 (×6): qty 1

## 2021-11-20 MED ORDER — METHYLPREDNISOLONE SODIUM SUCC 125 MG IJ SOLR
80.0000 mg | Freq: Two times a day (BID) | INTRAMUSCULAR | Status: DC
  Administered 2021-11-21: 80 mg via INTRAVENOUS
  Filled 2021-11-20: qty 2

## 2021-11-20 MED ORDER — OXYCODONE HCL 5 MG PO TABS: 5.0000 mg | ORAL_TABLET | ORAL | Status: DC | PRN

## 2021-11-20 MED ORDER — SODIUM CHLORIDE 0.9 % IV SOLN
2.0000 g | INTRAVENOUS | Status: DC
  Administered 2021-11-21 – 2021-11-22 (×2): 2 g via INTRAVENOUS
  Filled 2021-11-20 (×2): qty 20

## 2021-11-20 MED ORDER — ONDANSETRON HCL 4 MG/2ML IJ SOLN
4.0000 mg | Freq: Four times a day (QID) | INTRAMUSCULAR | Status: DC | PRN
  Administered 2021-11-22 – 2021-11-23 (×2): 4 mg via INTRAVENOUS
  Filled 2021-11-20 (×2): qty 2

## 2021-11-20 MED ORDER — SODIUM CHLORIDE 0.9 % IV SOLN
2.0000 g | Freq: Once | INTRAVENOUS | Status: AC
Start: 2021-11-20 — End: 2021-11-20
  Administered 2021-11-20: 2 g via INTRAVENOUS
  Filled 2021-11-20: qty 20

## 2021-11-20 MED ORDER — FLECAINIDE ACETATE 50 MG PO TABS
75.0000 mg | ORAL_TABLET | Freq: Two times a day (BID) | ORAL | Status: DC
  Administered 2021-11-20 – 2021-11-24 (×9): 75 mg via ORAL
  Filled 2021-11-20 (×11): qty 2

## 2021-11-20 MED ORDER — ROSUVASTATIN CALCIUM 5 MG PO TABS
5.0000 mg | ORAL_TABLET | Freq: Every day | ORAL | Status: DC
  Administered 2021-11-20 – 2021-11-23 (×4): 5 mg via ORAL
  Filled 2021-11-20 (×4): qty 1

## 2021-11-20 MED ORDER — HYDRALAZINE HCL 20 MG/ML IJ SOLN: 5.0000 mg | INTRAMUSCULAR | Status: DC | PRN

## 2021-11-20 NOTE — Consult Note (Signed)
? ?NAME:  Sean Riley, MRN:  [id number], DOB:  [birth year], LOS: 0 ?ADMISSION DATE:  11/20/2021, CONSULTATION DATE:  4/13 ?REFERRING MD:  Lorin Mercy, CHIEF COMPLAINT:  SOB  ? ?History of Present Illness:  ?Sean Riley, is a 71 y.o. male, who presented to the [facility] ED with a chief complaint of  SOB ? ?They have a pertinent past medical history of COPD on 5 to 6 L home O2, bilateral pleural effusions, CHF chronic respiratory failure, atrial fibrillation on Eliquis, AAA s/p endovascular repair in 2019, AVR with bioprosthetic valve, HLD, HTN, GIB ? ?Recent admission 2/17 - 10/03/2021 for COPD exacerbation.  Recently being seen by [facility] palliative care.  Patient states that they have not been by yet.  EMS called the evening of 4/12 for shortness of breath, reports of home oxygen sat being 70% on room air.  Unclear if patient was wearing oxygen or not. ? ?Chest x-ray in the ED concerning for multilobar bilateral pneumonia greater in the left than right, moderate bilateral pleural effusions, imaging similar to previous admission.  WBC 18.5, lactate 1.4. ABG 7.4 4/50/70/34.  Patient started on ceftriaxone and azithromycin.  Admitted to the hospitalist service. ? ?Patient work of breathing increased while in the emergency department was briefly placed on BiPAP.  On exam by PCCM was back on 6 L O2 per nasal cannula (baseline),  ? ?PCCM was consulted for assistance with management ? ? ?Pertinent  Medical History  ?COPD on 5 to 6 L home O2, bilateral pleural effusions, CHF chronic respiratory failure, atrial fibrillation on Eliquis, AAA s/p endovascular repair in 2019, AVR with bioprosthetic valve, HLD, HTN, GIB ? ?Significant Hospital Events: ?Including procedures, antibiotic start and stop dates in addition to other pertinent events   ?4/13 presented to [facility] ED. COPD exacerbation ? ?Interim History / Subjective:  ?See above ? ?Subjective: Breathing improved compared to earlier, denies chest pain, endorses normal yellow-white  mucus production, no increased production ? ?Objective   ?Blood pressure 106/60, pulse 75, resp. rate (!) 23, height '6\' 1"'$  (1.854 m), weight 76.6 kg, SpO2 97 %. ?   ?FiO2 (%):  [50 %] 50 %  ? ?Intake/Output Summary (Last 24 hours) at 11/20/2021 1118 ?Last data filed at 11/20/2021 1308 ?Gross per 24 hour  ?Intake 100 ml  ?Output --  ?Net 100 ml  ? ?Filed Weights  ? 11/20/21 0438  ?Weight: 76.6 kg  ? ? ?Examination: ?General: In bed, NAD, appears comfortable, frail appearing ?HEENT: MM pink/moist, anicteric, atraumatic ?Neuro: RASS 0, PERRL 71m, slightly confused ?CV: S1S2, NSR, no m/r/g appreciated ?PULM: Coarse in the upper lobes, diminished in lower lobes, no wheezes appreciated, trachea midline, chest expansion symmetric ?GI: soft, bsx4 active, non-tender   ?Extremities: warm/dry, no pretibial edema, capillary refill less than 3 seconds  ?Skin:  no rashes or lesions noted ? ?Labs: ?Lactic acid 1.4 ?Sodium 133 ?Glucose 212 ?Creatinine 1.5, BUN 17 ?Bilirubin 1.3 ?Albumin 3.1  ?WBC 18.5 ?Hemoglobin 11.6 ?INR 1.5 ?BMP 114 ?Troponin 96>104 ?Blood cultures pending ?Sputum culture pending ?ABG 7.4 4/50/70/34 ? ?Chest x-ray in the ED concerning for multilobar bilateral pneumonia greater in the left than right, moderate bilateral pleural effusions, imaging similar to previous admission.  ? ?Resolved Hospital Problem list   ? ? ?Assessment & Plan:  ?Acute COPD exacerbation ?Acute on chronic respiratory failure, secondary to COPD exacerbation, on 6L O2 at baseline ?History of diastolic congestive heart failure ?Chronic pleural effusions, partially loculated,  secondary to CHF ?ABG 7.4 4/50/70/34. Chest x-ray  in the ED concerning for multilobar bilateral pneumonia greater in the left than right, moderate bilateral pleural effusions, imaging similar to previous admission.  Reports being seen by [facility] palliative care, per patient unclear what has been done by that service at this time.  Patient has returned to home oxygen  requirement on exam.  No signs of volume overload.  No wheezing. Patient alone on exam.  Attempted to do goals of care conversation but unclear if patient understood discussions that were occurring. ?-Continue ceftriaxone and azithromycin.  Follow-up blood cultures and respiratory culture ?-Continue Brovana, Pulmicort, Yupelri ?-Pulmonary toilet as able ?-Recommend BiPAP nightly while inpatient ?-Recommend starting prednisone 60 mg daily with taper ?-No ICU needs at this time. Stable to admit to hospitalist service ?-PCCM will return on 4/14 for ongoing goals of care conversation.  With patient's severity of COPD and CHF intubation and mechanical ventilation mechanical ventilation would likely do more harm than good. ? ?All other issues per primary ? ?Best Practice (right click and "Reselect all SmartList Selections" daily)  ? ?Per primary ?Last date of multidisciplinary goals of care discussion [Pending] ? ?Labs   ?CBC: ?Recent Labs  ?Lab 11/20/21 ?0446 11/20/21 ?4008  ?WBC 18.5*  --   ?NEUTROABS 16.9*  --   ?HGB 11.6* 11.6*  ?HCT 36.6* 34.0*  ?MCV 85.5  --   ?PLT 267  --   ? ? ?Basic Metabolic Panel: ?Recent Labs  ?Lab 11/20/21 ?0446 11/20/21 ?6761  ?NA 133* 136  ?K 3.6 3.7  ?CL 91*  --   ?CO2 34*  --   ?GLUCOSE 212*  --   ?BUN 17  --   ?CREATININE 1.50*  --   ?CALCIUM 8.5*  --   ? ?GFR: ?Estimated Creatinine Clearance: 48.9 mL/min (A) (by C-G formula based on SCr of 1.5 mg/dL (H)). ?Recent Labs  ?Lab 11/20/21 ?0446  ?WBC 18.5*  ?LATICACIDVEN 1.4  ? ? ?Liver Function Tests: ?Recent Labs  ?Lab 11/20/21 ?0446  ?AST 20  ?ALT 14  ?ALKPHOS 77  ?BILITOT 1.3*  ?PROT 6.5  ?ALBUMIN 3.1*  ? ?No results for input(s): LIPASE, AMYLASE in the last 168 hours. ?No results for input(s): AMMONIA in the last 168 hours. ? ?ABG ?   ?Component Value Date/Time  ? PHART 7.442 11/20/2021 0633  ? PCO2ART 50.3 (H) 11/20/2021 9509  ? PO2ART 70 (L) 11/20/2021 3267  ? HCO3 34.4 (H) 11/20/2021 1245  ? TCO2 36 (H) 11/20/2021 8099  ? ACIDBASEDEF  2.0 03/25/2017 0742  ? O2SAT 95 11/20/2021 0633  ?  ? ?Coagulation Profile: ?Recent Labs  ?Lab 11/20/21 ?0446  ?INR 1.5*  ? ? ?Cardiac Enzymes: ?No results for input(s): CKTOTAL, CKMB, CKMBINDEX, TROPONINI in the last 168 hours. ? ?HbA1C: ?Hgb A1c MFr Bld  ?Date/Time Value Ref Range Status  ?08/16/2021 01:49 AM 5.5 4.8 - 5.6 % Final  ?  Comment:  ?  (NOTE) ?Pre diabetes:          5.7%-6.4% ? ?Diabetes:              >6.4% ? ?Glycemic control for   <7.0% ?adults with diabetes ?  ?01/25/2021 02:45 AM 4.4 (L) 4.8 - 5.6 % Final  ?  Comment:  ?  (NOTE) ?        Prediabetes: 5.7 - 6.4 ?        Diabetes: >6.4 ?        Glycemic control for adults with diabetes: <7.0 ?  ? ? ?CBG: ?No results for input(s):  GLUCAP in the last 168 hours. ? ?Review of Systems:   ?Positives in bold ? ?Gen: fever, chills, weight change, fatigue, night sweats ?HEENT:  blurred vision, double vision, hearing loss, tinnitus, sinus congestion, rhinorrhea, sore throat, neck stiffness, dysphagia ?PULM:  shortness of breath, cough, sputum production, hemoptysis, wheezing ?CV: chest pain, edema, orthopnea, paroxysmal nocturnal dyspnea, palpitations ?GI:  abdominal pain, nausea, vomiting, diarrhea, hematochezia, melena, constipation, change in bowel habits ?GU: dysuria, hematuria, polyuria, oliguria, urethral discharge ?Endocrine: hot or cold intolerance, polyuria, polyphagia or appetite change ?Derm: rash, dry skin, scaling or peeling skin change ?Heme: easy bruising, bleeding, bleeding gums ?Neuro: headache, numbness, weakness, slurred speech, loss of memory or consciousness ? ? ?Past Medical History:  ?He,  has a past medical history of Acute on chronic respiratory failure with hypercapnia ([place]), Allergy, Arthritis, Ascending aortic dissection ([place]) (03/23/2017), Atrial fibrillation ([place]), Cataract, CHF (congestive heart failure) ([place]), GERD (gastroesophageal reflux disease), HYPERLIPIDEMIA, 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 LUTS, Hypertension,  Microscopic hematuria, NONSPECIFIC ABNORMAL ELECTROCARDIOGRAM, Pleural effusion on right, Pleural effusion, bilateral, S/P aortic dissection repair (03/24/2017), S/P [place] aortic root replacement with bioprosthet

## 2021-11-20 NOTE — ED Provider Notes (Signed)
?[place] DEPT ?[facility] Emergency Department ?Provider Note ?MRN:  [id number]  ?Arrival date & time: 11/20/21    ? ?Chief Complaint   ?Respiratory Distress ?  ?History of Present Illness   ?Sean Riley is a 71 y.o. year-old male with a history of aortic aneurysm, COPD presenting to the ED with chief complaint of respiratory distress. ? ?Fever, cough, respiratory distress, brought in by EMS. ? ?Review of Systems  ?I was unable to obtain a full/accurate HPI, PMH, or ROS due to the patient's respiratory distress. ? ?Patient's Health History   ? ?Past Medical History:  ?Diagnosis Date  ? Acute on chronic respiratory failure with hypercapnia (HCC)   ? Allergy   ? Arthritis   ? Ascending aortic dissection ([place]) 03/23/2017  ? Atrial fibrillation ([place])   ? Cataract   ? removed both eyes   ? CHF (congestive heart failure) ([place])   ? GERD (gastroesophageal reflux disease)   ? HYPERLIPIDEMIA   ? 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 Hypertension   ? Microscopic hematuria   ? negative cystoscopy  ? NONSPECIFIC ABNORMAL ELECTROCARDIOGRAM   ? Pleural effusion on right   ? Pleural effusion, bilateral   ? S/P aortic dissection repair 03/24/2017  ? Biological Bentall aortic root replacement + resection and grafting of entire ascending aorta, transverse aortic arch and proximal descending thoracic aorta with elephant trunk distal anastomosis and debranching of aortic arch vessels  ? 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 25 mm [facility] Ease bovine pericardial tissue valve and 28 mm Gelweave Valsalva aortic root graft with reimplantation of left main and right coronary arteries  ? Varicose veins   ?  ?Past Surgical History:  ?Procedure Laterality Date  ? APPENDECTOMY    ? CARDIAC CATHETERIZATION    ?  X3;Dr Caryl Comes, last 01/20/12: mild non-obstructive CAD, normal LV systolic function  ? CARDIOVERSION N/A 05/31/2018  ? Procedure: CARDIOVERSION;  Surgeon: Sueanne Margarita, MD;  Location: [facility] ENDOSCOPY;  Service: Cardiovascular;  Laterality: N/A;  ? CARDIOVERSION N/A 06/13/2020  ? Procedure: CARDIOVERSION;  Surgeon: Larey Dresser, MD;  Location: [facility] ENDOSCOPY;  Service: Cardiovascular;  Laterality: N/A;  ? COLONOSCOPY    ? COLONOSCOPY N/A 01/27/2021  ? Procedure: COLONOSCOPY;  Surgeon: Gatha Mayer, MD;  Location: [facility] ENDOSCOPY;  Service: Endoscopy;  Laterality: N/A;  ? CYSTOSCOPY  1978  ? Dr Hartley Barefoot  ? ESOPHAGOGASTRODUODENOSCOPY N/A 01/27/2021  ? Procedure: ESOPHAGOGASTRODUODENOSCOPY (EGD);  Surgeon: Gatha Mayer, MD;  Location: [facility] ENDOSCOPY;  Service: Endoscopy;  Laterality: N/A;  ? EYE SURGERY    ? GIVENS CAPSULE STUDY N/A 01/27/2021  ? Procedure: GIVENS CAPSULE STUDY;  Surgeon: Gatha Mayer, MD;  Location: [facility] ENDOSCOPY;  Service: Endoscopy;  Laterality: N/A;  ? IR THORACENTESIS ASP PLEURAL SPACE W/IMG GUIDE  11/25/2017  ? KNEE ARTHROSCOPY  2012  ? Dr Theda Sers  ? LUMBAR LAMINECTOMY  1987  ? Dr Durward Fortes  ? MYELOGRAM  2007  ? PERICARDIOCENTESIS N/A 04/07/2017  ? Procedure: PERICARDIOCENTESIS;  Surgeon: Sherren Mocha, MD;  Location: [place] CV LAB;  Service: Cardiovascular;  Laterality: N/A;  ? POLYPECTOMY    ? POLYPECTOMY  01/27/2021  ? Procedure: POLYPECTOMY;  Surgeon: Gatha Mayer, MD;  Location: [place];  Service: Endoscopy;;  ? PRESSURE SENSOR/CARDIOMEMS N/A 12/26/2020  ? Procedure: PRESSURE SENSOR/CARDIOMEMS;  Surgeon: Larey Dresser, MD;  Location: [place] CV LAB;  Service: Cardiovascular;  Laterality: N/A;  ?  REPAIR OF ACUTE ASCENDING THORACIC AORTIC DISSECTION N/A 03/23/2017  ? Procedure: REPAIR OF ACUTE ASCENDING THORACIC AORTIC DISSECTION.  Bentall procedure.  Aortic root repleacement with bioprosthetic valve.  Reimplantation of left and right coronary arteries.  Total resection of transverse aortic arch.  Elephant trunk distal anastomosis and debranching of arch vessels.;  Surgeon: Rexene Alberts, MD;  Location: [place] OR;  Service: Vascular;   Laterality: N/A;  ? RIGHT HEART CATH N/A 06/15/2019  ? Procedure: RIGHT HEART CATH;  Surgeon: Larey Dresser, MD;  Location: [place] CV LAB;  Service: Cardiovascular;  Laterality: N/A;  ? ROTATOR CUFF REPAIR    ? SHOULDER ARTHROSCOPY  2011  ? Dr Theda Sers  ? SHOULDER ARTHROSCOPY Right 08/2015  ? SPINAL FUSION  1986  ? Dr Rolin Barry  ? TEE WITHOUT CARDIOVERSION N/A 03/23/2017  ? Procedure: TRANSESOPHAGEAL ECHOCARDIOGRAM (TEE);  Surgeon: Rexene Alberts, MD;  Location: [place];  Service: Open Heart Surgery;  Laterality: N/A;  ? TEE WITHOUT CARDIOVERSION N/A 08/21/2021  ? Procedure: TRANSESOPHAGEAL ECHOCARDIOGRAM (TEE);  Surgeon: Larey Dresser, MD;  Location: [facility] ENDOSCOPY;  Service: Cardiovascular;  Laterality: N/A;  ? THORACIC AORTIC ENDOVASCULAR STENT GRAFT N/A 03/30/2018  ? Procedure: THORACIC AORTIC ENDOVASCULAR STENT GRAFT WITH INTRAVASCULAR ULTRASOUND;  Surgeon: Serafina Mitchell, MD;  Location: [facility] OR;  Service: Vascular;  Laterality: N/A;  ? TRACHEOSTOMY    ? age 2 for croup  ?  ?Family History  ?Problem Relation Age of Onset  ? Hypertension Mother   ? Hypertension Father   ? Benign prostatic hyperplasia Father   ?     S/P TURP  ? Heart attack Maternal Grandmother   ?     MI in 18s  ? Breast cancer Maternal Grandmother   ? Arrhythmia Brother   ?      X 2  ? Heart attack Maternal Aunt   ?     MI in 33s  ? Stroke Neg Hx   ? Diabetes Neg Hx   ? Colon cancer Neg Hx   ? Colon polyps Neg Hx   ? Stomach cancer Neg Hx   ? Rectal cancer Neg Hx   ? Esophageal cancer Neg Hx   ? Prostate cancer Neg Hx   ? Other Neg Hx   ?     gynecomastia  ?  ?Social History  ? ?Socioeconomic History  ? Marital status: Married  ?  Spouse name: Not on file  ? Number of children: 2  ? Years of education: Not on file  ? Highest education level: Not on file  ?Occupational History  ? 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 Smoking status: Former  ?  Packs/day: 2.00  ?  Years: 45.00  ?  Pack years: 90.00  ?  Types: Cigarettes  ?   Quit date: 03/22/2017  ?  Years since quitting: 4.6  ? Smokeless tobacco: Never  ? Tobacco comments:  ?  2 ppd , quit 03/2017  ?Vaping Use  ? Vaping Use: Never used  ?Substance and Sexual Activity  ? Alcohol use: Not Currently  ? Drug use: No  ? Sexual activity: Never  ?Other Topics Concern  ? Not on file  ?Social History Narrative  ? Lives w/ wife  ? ?Social Determinants of Health  ? ?Financial Resource Strain: Not on file  ?Food Insecurity: Not on file  ?Transportation Needs: Not on file  ?Physical Activity: Not on file  ?Stress: Not on file  ?Social  Connections: Not on file  ?Intimate Partner Violence: Not on file  ?  ? ?Physical Exam  ? ?Vitals:  ? 11/20/21 0600 11/20/21 0630  ?BP: (!) 109/57 (!) 93/55  ?Pulse: 80 79  ?Resp: (!) 23 (!) 22  ?SpO2: 95% 96%  ?  ?CONSTITUTIONAL: Ill-appearing, in moderate respiratory distress ?NEURO/PSYCH:  Alert and oriented to name, moves all extremities ?EYES:  eyes equal and reactive ?ENT/NECK:  no LAD, no JVD ?CARDIO: Regular rate, well-perfused, loud murmur ?PULM: Tachypneic, accessory muscle use, scattered wheezes and rhonchi ?GI/GU:  non-distended, non-tender ?MSK/SPINE:  No gross deformities, no edema ?SKIN:  no rash, atraumatic ? ? ?*Additional and/or pertinent findings included in MDM below ? ?Diagnostic and Interventional Summary  ? ? EKG Interpretation ? ?Date/Time:  Thursday November 20 2021 04:30:35 EDT ?Ventricular Rate:  106 ?PR Interval:  162 ?QRS Duration: 150 ?QT Interval:  411 ?QTC Calculation: 546 ?R Axis:   262 ?Text Interpretation: Sinus tachycardia Probable left atrial enlargement RBBB and LAFB Artifact in lead(s) I II aVR aVL aVF No significant change was found Confirmed by Gerlene Fee [phone]) on 11/20/2021 4:45:19 AM ?  ? ?  ? ?Labs Reviewed  ?COMPREHENSIVE METABOLIC PANEL - Abnormal; Notable for the following components:  ?    Result Value  ? Sodium 133 (*)   ? Chloride 91 (*)   ? CO2 34 (*)   ? Glucose, Bld 212 (*)   ? Creatinine, Ser 1.50 (*)   ? Calcium  8.5 (*)   ? Albumin 3.1 (*)   ? Total Bilirubin 1.3 (*)   ? GFR, Estimated 49 (*)   ? All other components within normal limits  ?CBC WITH DIFFERENTIAL/PLATELET - Abnormal; Notable for the following components

## 2021-11-20 NOTE — Significant Event (Addendum)
Pt with 2 bottles (1 from each set) of BCx growing a strep species (not pneumococcus, not enterococcus). ? ?Pt already on Rocephin which should cover ?Culture pending. ? ? ?Note also h/o bioprosthetic aortic valve, E.Facialis bacteremia in Jan though [name] of valve neg at that time. ? ?Discussion: strep bacteremia could be related to Pneumonia.  Though depending on the species, and given new BRBPR today, occult colon CA might also be a consideration. ? 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  Ill certainly defer any further work up along these lines for the moment. ? ?On further review: looks like he had colonoscopy less than a year ago (just 10 months ago) = 3 tiny polyps, diverticulosis, and no mass, colon CA, etc.  So this would argue that colon cancer is less likely. ?

## 2021-11-20 NOTE — Progress Notes (Signed)
PHARMACY - PHYSICIAN COMMUNICATION ?CRITICAL VALUE ALERT - BLOOD CULTURE IDENTIFICATION (BCID) ? ?Sean Riley is an 71 y.o. male who presented to [facility] on 11/20/2021 with a chief complaint of respiratory distress ? ?Assessment:  Pt with 1 bottle from each blood culture growing strep species to total of 2 bottles (suspected source - lungs) ? ?Name of physician (or Provider) Contacted: Dr. Alcario Drought ? ?Current antibiotics: Rocephin 2gm IV q24h ? ?Changes to prescribed antibiotics recommended:  ?Patient is on recommended antibiotics - No changes needed ? ?Results for orders placed or performed during the hospital encounter of 11/20/21  ?Blood Culture ID Panel (Reflexed) (Collected: 11/20/2021  5:05 AM)  ?Result Value Ref Range  ? Enterococcus faecalis NOT DETECTED NOT DETECTED  ? Enterococcus Faecium NOT DETECTED NOT DETECTED  ? Listeria monocytogenes NOT DETECTED NOT DETECTED  ? Staphylococcus species NOT DETECTED NOT DETECTED  ? Staphylococcus aureus (BCID) NOT DETECTED NOT DETECTED  ? Staphylococcus epidermidis NOT DETECTED NOT DETECTED  ? Staphylococcus lugdunensis NOT DETECTED NOT DETECTED  ? Streptococcus species DETECTED (A) NOT DETECTED  ? Streptococcus agalactiae NOT DETECTED NOT DETECTED  ? Streptococcus pneumoniae NOT DETECTED NOT DETECTED  ? Streptococcus pyogenes NOT DETECTED NOT DETECTED  ? A.calcoaceticus-baumannii NOT DETECTED NOT DETECTED  ? Bacteroides fragilis NOT DETECTED NOT DETECTED  ? Enterobacterales NOT DETECTED NOT DETECTED  ? Enterobacter cloacae complex NOT DETECTED NOT DETECTED  ? Escherichia coli NOT DETECTED NOT DETECTED  ? Klebsiella aerogenes NOT DETECTED NOT DETECTED  ? Klebsiella oxytoca NOT DETECTED NOT DETECTED  ? Klebsiella pneumoniae NOT DETECTED NOT DETECTED  ? Proteus species NOT DETECTED NOT DETECTED  ? Salmonella species NOT DETECTED NOT DETECTED  ? Serratia marcescens NOT DETECTED NOT DETECTED  ? Haemophilus influenzae NOT DETECTED NOT DETECTED  ? Neisseria  meningitidis NOT DETECTED NOT DETECTED  ? Pseudomonas aeruginosa NOT DETECTED NOT DETECTED  ? Stenotrophomonas maltophilia NOT DETECTED NOT DETECTED  ? Candida albicans NOT DETECTED NOT DETECTED  ? Candida auris NOT DETECTED NOT DETECTED  ? Candida glabrata NOT DETECTED NOT DETECTED  ? Candida krusei NOT DETECTED NOT DETECTED  ? Candida parapsilosis NOT DETECTED NOT DETECTED  ? Candida tropicalis NOT DETECTED NOT DETECTED  ? Cryptococcus neoformans/gattii NOT DETECTED NOT DETECTED  ? ? ?Sherlon Handing, PharmD, BCPS ?Please see amion for complete clinical pharmacist phone list ?11/20/2021  8:50 PM ? ?

## 2021-11-20 NOTE — ED Notes (Signed)
Pt removed from BiPAP by RT, discussed with Yates MD regarding pts improved respiratory effort. Pt placed on 6L via [place]. ?

## 2021-11-20 NOTE — H&P (Signed)
?History and Physical  ? ? ?Patient: Sean Riley PFX:[id number] DOB: Jul 12, 1951 ?DOA: 11/20/2021 ?DOS: the patient was seen and examined on 11/20/2021 ?PCP: Colon Branch, MD  ?Patient coming from: Home - lives with wife; NOK: Wife, Sean Riley, [phone] ? ? ?Chief Complaint: respiratory distress ? ?HPI: Sean Riley is a 71 y.o. male with medical history significant of afib; HLD; HTN; COPD on 6L home O2 and on hospice; aortic dissection repair; and pleural effusions presenting with respiratory distress.  He was recently started on hospice because his lung doctor and PCP both told him that his lungs are very bad and unlikely to get better.  He is on 6-8L home O2 at baseline.  He has had URI symptoms for maybe a week and became acutely SOB overnight. He has had chills and his wife checked and found his temp to be 102.  His wife called the hospice nurse and she recommended that they come to the ER.  EMS found his sats to be 70% on RA and placed CPAP with improvement to 82%.  In the ER he was started on BIPAP.  With my initial visit, his wife was not in the room and he requested terminal BIPAP discontinuation.  However, once his wife arrived we discussed further and she explained that he is full code and would want mechanical ventilation for up to "2 weeks and 2 days" but not longer than that and no feeding tube.  Upon requestioning, he was willing to restart BIPAP if removal failed and would also be willing to be intubated if needed.   ? ? ? ?ER Course:  [place], per Dr. Josephine Cables: ? ?Sepsis secondary to multilobar pneumonia with superimposed COPD exacerbation.  Patient in home hospice (2 weeks ago) developed shortness of breath despite baseline supplemental oxygen at 6 LPM.  O2 sat on room air was 70%, this increased to 82% on CPAP.  Apparently, patient had fever earlier in the day.  O2 sat is 92 to 96% on BiPAP.  He met sepsis criteria in the ED. ? ? ? ?Review of Systems: unable to review all systems due  to the inability of the patient to answer questions. (On BIPAP) ?Past Medical History:  ?Diagnosis Date  ? Acute on chronic respiratory failure with hypercapnia (HCC)   ? Allergy   ? Arthritis   ? Ascending aortic dissection ([place]) 03/23/2017  ? Atrial fibrillation ([place])   ? Cataract   ? removed both eyes   ? CHF (congestive heart failure) ([place])   ? GERD (gastroesophageal reflux disease)   ? HYPERLIPIDEMIA   ? 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 Hypertension   ? Microscopic hematuria   ? negative cystoscopy  ? NONSPECIFIC ABNORMAL ELECTROCARDIOGRAM   ? Pleural effusion on right   ? Pleural effusion, bilateral   ? S/P aortic dissection repair 03/24/2017  ? Biological Bentall aortic root replacement + resection and grafting of entire ascending aorta, transverse aortic arch and proximal descending thoracic aorta with elephant trunk distal anastomosis and debranching of aortic arch vessels  ? 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 25 mm [facility] Ease bovine pericardial tissue valve and 28 mm Gelweave Valsalva aortic root graft with reimplantation of left main and right coronary arteries  ? Varicose veins   ? ?Past Surgical History:  ?Procedure Laterality Date  ? APPENDECTOMY    ? CARDIAC CATHETERIZATION    ?  Harvin Hazel, last 01/20/12:  mild non-obstructive CAD, normal LV systolic function  ? CARDIOVERSION N/A 05/31/2018  ? Procedure: CARDIOVERSION;  Surgeon: Sueanne Margarita, MD;  Location: [facility] ENDOSCOPY;  Service: Cardiovascular;  Laterality: N/A;  ? CARDIOVERSION N/A 06/13/2020  ? Procedure: CARDIOVERSION;  Surgeon: Larey Dresser, MD;  Location: [facility] ENDOSCOPY;  Service: Cardiovascular;  Laterality: N/A;  ? COLONOSCOPY    ? COLONOSCOPY N/A 01/27/2021  ? Procedure: COLONOSCOPY;  Surgeon: Gatha Mayer, MD;  Location: [facility] ENDOSCOPY;  Service: Endoscopy;  Laterality: N/A;  ? CYSTOSCOPY  1978  ? Dr Hartley Barefoot  ? ESOPHAGOGASTRODUODENOSCOPY N/A 01/27/2021  ? Procedure:  ESOPHAGOGASTRODUODENOSCOPY (EGD);  Surgeon: Gatha Mayer, MD;  Location: [facility] ENDOSCOPY;  Service: Endoscopy;  Laterality: N/A;  ? EYE SURGERY    ? GIVENS CAPSULE STUDY N/A 01/27/2021  ? Procedure: GIVENS CAPSULE STUDY;  Surgeon: Gatha Mayer, MD;  Location: [facility] ENDOSCOPY;  Service: Endoscopy;  Laterality: N/A;  ? IR THORACENTESIS ASP PLEURAL SPACE W/IMG GUIDE  11/25/2017  ? KNEE ARTHROSCOPY  2012  ? Dr Theda Sers  ? LUMBAR LAMINECTOMY  1987  ? Dr Durward Fortes  ? MYELOGRAM  2007  ? PERICARDIOCENTESIS N/A 04/07/2017  ? Procedure: PERICARDIOCENTESIS;  Surgeon: Sherren Mocha, MD;  Location: [place] CV LAB;  Service: Cardiovascular;  Laterality: N/A;  ? POLYPECTOMY    ? POLYPECTOMY  01/27/2021  ? Procedure: POLYPECTOMY;  Surgeon: Gatha Mayer, MD;  Location: [place];  Service: Endoscopy;;  ? PRESSURE SENSOR/CARDIOMEMS N/A 12/26/2020  ? Procedure: PRESSURE SENSOR/CARDIOMEMS;  Surgeon: Larey Dresser, MD;  Location: [place] CV LAB;  Service: Cardiovascular;  Laterality: N/A;  ? REPAIR OF ACUTE ASCENDING THORACIC AORTIC DISSECTION N/A 03/23/2017  ? Procedure: REPAIR OF ACUTE ASCENDING THORACIC AORTIC DISSECTION.  Bentall procedure.  Aortic root repleacement with bioprosthetic valve.  Reimplantation of left and right coronary arteries.  Total resection of transverse aortic arch.  Elephant trunk distal anastomosis and debranching of arch vessels.;  Surgeon: Rexene Alberts, MD;  Location: [place] OR;  Service: Vascular;  Laterality: N/A;  ? RIGHT HEART CATH N/A 06/15/2019  ? Procedure: RIGHT HEART CATH;  Surgeon: Larey Dresser, MD;  Location: [place] CV LAB;  Service: Cardiovascular;  Laterality: N/A;  ? ROTATOR CUFF REPAIR    ? SHOULDER ARTHROSCOPY  2011  ? Dr Theda Sers  ? SHOULDER ARTHROSCOPY Right 08/2015  ? SPINAL FUSION  1986  ? Dr Rolin Barry  ? TEE WITHOUT CARDIOVERSION N/A 03/23/2017  ? Procedure: TRANSESOPHAGEAL ECHOCARDIOGRAM (TEE);  Surgeon: Rexene Alberts, MD;  Location: [place];  Service: Open Heart  Surgery;  Laterality: N/A;  ? TEE WITHOUT CARDIOVERSION N/A 08/21/2021  ? Procedure: TRANSESOPHAGEAL ECHOCARDIOGRAM (TEE);  Surgeon: Larey Dresser, MD;  Location: [facility] ENDOSCOPY;  Service: Cardiovascular;  Laterality: N/A;  ? THORACIC AORTIC ENDOVASCULAR STENT GRAFT N/A 03/30/2018  ? Procedure: THORACIC AORTIC ENDOVASCULAR STENT GRAFT WITH INTRAVASCULAR ULTRASOUND;  Surgeon: Serafina Mitchell, MD;  Location: [facility] OR;  Service: Vascular;  Laterality: N/A;  ? TRACHEOSTOMY    ? age 54 for croup  ? ?Social History:  reports that he quit smoking about 4 years ago. His smoking use included cigarettes. He has a 90.00 pack-year smoking history. He has never used smokeless tobacco. He reports that he does not currently use alcohol. He reports that he does not use drugs. ? ?Allergies  ?Allergen Reactions  ? Simvastatin Other (See Comments)  ?  Mental status changes  ? Celecoxib Other (See Comments)  ?  GI UPSET AND INFLAMMATION  ?  Codeine Other (See Comments)  ?  GI UPSET AND INFLAMMATION  ? Nsaids Other (See Comments)  ?  GI UPSET AND INFLAMMATION (can tolerate via IV)  ? Tape Other (See Comments)  ?  Medical tape and Band-Aids PULL OFF THE SKIN; please use Coban wrap  ? Cephalexin Itching and Rash  ? Latex Rash  ? ? ?Family History  ?Problem Relation Age of Onset  ? Hypertension Mother   ? Hypertension Father   ? Benign prostatic hyperplasia Father   ?     S/P TURP  ? Heart attack Maternal Grandmother   ?     MI in 52s  ? Breast cancer Maternal Grandmother   ? Arrhythmia Brother   ?      X 2  ? Heart attack Maternal Aunt   ?     MI in 66s  ? Stroke Neg Hx   ? Diabetes Neg Hx   ? Colon cancer Neg Hx   ? Colon polyps Neg Hx   ? Stomach cancer Neg Hx   ? Rectal cancer Neg Hx   ? Esophageal cancer Neg Hx   ? Prostate cancer Neg Hx   ? Other Neg Hx   ?     gynecomastia  ? ? ?Prior to Admission medications   ?Medication Sig Start Date End Date Taking? Authorizing Provider  ?acetaminophen (TYLENOL) 500 MG tablet Take 500 mg by mouth  every 6 (six) hours as needed for moderate pain or headache.   Yes [provider]  ?albuterol (PROAIR HFA) 108 (90 Base) MCG/ACT inhaler Inhale 2 puffs into the lungs every 4 (four) hours as needed for

## 2021-11-20 NOTE — Sepsis Progress Note (Signed)
Monitoring for the code sepsis protocol. °

## 2021-11-20 NOTE — ED Triage Notes (Signed)
Pt from home c/o SHOB, fever, cough. EMS reports pt wears 6 liters of oxygen at home all the time. Pt under Hospice care that started 2 weeks ago. EMS reports they were called out due to respiratory distress, home oxygen sats were 70% on room air. Put on CPAP and oxygen sats came up to 82%. ?

## 2021-11-20 NOTE — Significant Event (Signed)
Called by RN: Pt with bowel movement with blood in it (bright red). ? ?1) stop eliquis, ordering SCDs ?2) check repeat H/H ?3) type and screen ?4) RN to call back with further bloody BMs: may need K.Centra ?

## 2021-11-21 ENCOUNTER — Inpatient Hospital Stay (HOSPITAL_COMMUNITY): Payer: Medicare Other

## 2021-11-21 DIAGNOSIS — J9621 Acute and chronic respiratory failure with hypoxia: Secondary | ICD-10-CM | POA: Diagnosis not present

## 2021-11-21 DIAGNOSIS — J90 Pleural effusion, not elsewhere classified: Secondary | ICD-10-CM

## 2021-11-21 DIAGNOSIS — J431 Panlobular emphysema: Secondary | ICD-10-CM

## 2021-11-21 DIAGNOSIS — R079 Chest pain, unspecified: Secondary | ICD-10-CM

## 2021-11-21 DIAGNOSIS — J189 Pneumonia, unspecified organism: Secondary | ICD-10-CM | POA: Diagnosis not present

## 2021-11-21 DIAGNOSIS — A419 Sepsis, unspecified organism: Secondary | ICD-10-CM | POA: Diagnosis not present

## 2021-11-21 LAB — CBC
HCT: 31 % — ABNORMAL LOW (ref 39.0–52.0)
HCT: 32 % — ABNORMAL LOW (ref 39.0–52.0)
Hemoglobin: 9.9 g/dL — ABNORMAL LOW (ref 13.0–17.0)
Hemoglobin: 10 g/dL — ABNORMAL LOW (ref 13.0–17.0)
MCH: 27.3 pg (ref 26.0–34.0)
MCH: 26.9 pg (ref 26.0–34.0)
MCHC: 31.9 g/dL (ref 30.0–36.0)
MCHC: 31.3 g/dL (ref 30.0–36.0)
MCV: 85.4 fL (ref 80.0–100.0)
MCV: 86 fL (ref 80.0–100.0)
Platelets: 253 10*3/uL (ref 150–400)
Platelets: 253 10*3/uL (ref 150–400)
RBC: 3.63 MIL/uL — ABNORMAL LOW (ref 4.22–5.81)
RBC: 3.72 MIL/uL — ABNORMAL LOW (ref 4.22–5.81)
RDW: 16.8 % — ABNORMAL HIGH (ref 11.5–15.5)
RDW: 16.8 % — ABNORMAL HIGH (ref 11.5–15.5)
WBC: 17.1 10*3/uL — ABNORMAL HIGH (ref 4.0–10.5)
WBC: 14.6 10*3/uL — ABNORMAL HIGH (ref 4.0–10.5)
nRBC: 0 % (ref 0.0–0.2)
nRBC: 0 % (ref 0.0–0.2)

## 2021-11-21 LAB — BASIC METABOLIC PANEL
Anion gap: 7 (ref 5–15)
BUN: 17 mg/dL (ref 8–23)
CO2: 30 mmol/L (ref 22–32)
Calcium: 8.3 mg/dL — ABNORMAL LOW (ref 8.9–10.3)
Chloride: 94 mmol/L — ABNORMAL LOW (ref 98–111)
Creatinine, Ser: 1.21 mg/dL (ref 0.61–1.24)
GFR, Estimated: >60 mL/min (ref >60)
Glucose, Bld: 262 mg/dL — ABNORMAL HIGH (ref 70–99)
Potassium: 3.2 mmol/L — ABNORMAL LOW (ref 3.5–5.1)
Sodium: 131 mmol/L — ABNORMAL LOW (ref 135–145)

## 2021-11-21 LAB — TROPONIN I (HIGH SENSITIVITY)
Troponin I (High Sensitivity): 24 ng/L — ABNORMAL HIGH (ref <18)
Troponin I (High Sensitivity): 22 ng/L — ABNORMAL HIGH (ref <18)

## 2021-11-21 LAB — PROCALCITONIN: Procalcitonin: 0.37 ng/mL

## 2021-11-21 MED ORDER — PREDNISONE 20 MG PO TABS
40.0000 mg | ORAL_TABLET | Freq: Two times a day (BID) | ORAL | Status: DC
  Administered 2021-11-21 – 2021-11-24 (×6): 40 mg via ORAL
  Filled 2021-11-21 (×6): qty 2

## 2021-11-21 MED ORDER — ALUM & MAG HYDROXIDE-SIMETH 200-200-20 MG/5ML PO SUSP
30.0000 mL | ORAL | Status: DC | PRN
  Administered 2021-11-21 – 2021-11-22 (×2): 30 mL via ORAL
  Filled 2021-11-21 (×2): qty 30

## 2021-11-21 MED ORDER — ENSURE ENLIVE PO LIQD
237.0000 mL | Freq: Two times a day (BID) | ORAL | Status: DC
  Administered 2021-11-21 – 2021-11-24 (×4): 237 mL via ORAL

## 2021-11-21 MED ORDER — NITROGLYCERIN 0.4 MG SL SUBL: 0.4000 mg | SUBLINGUAL_TABLET | SUBLINGUAL | Status: DC | PRN

## 2021-11-21 MED ORDER — FUROSEMIDE 10 MG/ML IJ SOLN
40.0000 mg | Freq: Once | INTRAMUSCULAR | Status: AC
  Administered 2021-11-21: 40 mg via INTRAVENOUS
  Filled 2021-11-21: qty 4

## 2021-11-21 MED ORDER — MAGNESIUM SULFATE 2 GM/50ML IV SOLN
2.0000 g | Freq: Once | INTRAVENOUS | Status: AC
  Administered 2021-11-21: 2 g via INTRAVENOUS
  Filled 2021-11-21: qty 50

## 2021-11-21 MED ORDER — POTASSIUM CHLORIDE CRYS ER 20 MEQ PO TBCR
40.0000 meq | EXTENDED_RELEASE_TABLET | Freq: Once | ORAL | Status: AC
  Administered 2021-11-21: 40 meq via ORAL
  Filled 2021-11-21: qty 2

## 2021-11-21 MED ORDER — POTASSIUM CHLORIDE CRYS ER 20 MEQ PO TBCR
40.0000 meq | EXTENDED_RELEASE_TABLET | Freq: Once | ORAL | Status: AC
Start: 2021-11-21 — End: 2021-11-21
  Administered 2021-11-21: 40 meq via ORAL
  Filled 2021-11-21: qty 2

## 2021-11-21 MED ORDER — ADULT MULTIVITAMIN W/MINERALS CH
1.0000 | ORAL_TABLET | Freq: Every day | ORAL | Status: DC
  Administered 2021-11-21 – 2021-11-24 (×4): 1 via ORAL
  Filled 2021-11-21 (×4): qty 1

## 2021-11-21 MED ORDER — ENOXAPARIN SODIUM 40 MG/0.4ML IJ SOSY
40.0000 mg | PREFILLED_SYRINGE | INTRAMUSCULAR | Status: DC
  Administered 2021-11-22: 40 mg via SUBCUTANEOUS
  Filled 2021-11-21: qty 0.4

## 2021-11-21 NOTE — Progress Notes (Signed)
? Sean Riley  BXU:[id number] DOB: October 26, 1950 DOA: 11/20/2021 ?PCP: Colon Branch, MD   ? ?Brief Narrative:  ?71 year old with a history of atrial fibrillation, HLD, HTN, COPD on chronic 6 L home O2 support, and aortic dissection status post repair who presently receives home hospice assistance due to end stage COPD.  He presented to the ED with a week of upper respiratory symptoms which acutely got worse the night before with severe shortness of breath and chills and a temperature of 102 measured at home. His wife contacted the hospice nurse who reportedly recommended they present to the ER.  EMS was summoned and found his saturations to be 70% on room air.  He was placed on CPAP for transportation and then transitioned to BiPAP once in the ER.  CXR in the ED noted stable bilateral effusions and scattered infiltrates, not clearly different from his baseline x-rays. ? ?Consultants:  ?PCCM ?Palliative Care  ? ?Code Status: FULL CODE ? ?DVT prophylaxis: ?Lovenox  ? ?Interim Hx: ?While in the ED last night the patient suffered an episode of BRBPR.  Eliquis was stopped.  He also suffered an acute exacerbation of his A-fib with RVR last night.  Presently afebrile with controlled heart rate and stable blood pressure.  Requiring 8 L high flow nasal cannula oxygen support, but has been liberated from BiPAP. ? ?Assessment & Plan: ? ?Possible multilobar pneumonia ?Comparison of CXR to baseline does not suggest acute change - cont empiric abx for now given + blood cultures  ? ?2 of 2 blood cultures positive for Strep species ?Discussed w/ ID - given active life threatening issues I did not request a formal consult - will continue conservative care for now w/ ongoing abx tx - pt would clearly not be a candidate for SBE eval (TEE) or colonoscopy to rule out occult colon lesion as source  ? ?Severe COPD with acute bronchospastic exacerbation -acute on chronic hypoxic respiratory failure ?Tx as per PCCM - utilizing morphine for  dypsnea - end stage disease  ? ?History of Enterococcus bacteremia January 2023 ? 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 ?Chronic diastolic CHF exacerbation ?Monitor volume - does not appear grossly overloaded at this time  ? ?Chronic atrial fibrillation with acute RVR ?Titrate medications to effect HR control - hold full anticoag for now given GIB ? ?BRBPR ?Appears to have been self-limited -likely hemorrhoidal -monitor clinically ? ?Anemia of acute blood loss ?Follow hemoglobin which is presently trending downward -no indication for transfusion at this time ? ?Hyponatremia ?Likely simply volume related -follow trend ? ?Hypokalemia ?Supplement and follow ? ? ?Family Communication: Spoke with patient and family at bedside ?Disposition: From home  ? ?Objective: ?Blood pressure 115/61, pulse 86, temperature 97.7 ?F (36.5 ?C), temperature source Oral, resp. rate 14, height '6\' 1"'$  (1.854 m), weight 76.6 kg, SpO2 95 %. ? ?Intake/Output Summary (Last 24 hours) at 11/21/2021 1006 ?Last data filed at 11/21/2021 0700 ?Gross per 24 hour  ?Intake 1443.45 ml  ?Output 751 ml  ?Net 692.45 ml  ? ?Filed Weights  ? 11/20/21 0438  ?Weight: 76.6 kg  ? ? ?Examination: ?General: Alert, dyspneic with conversation.  Not in extremis ?Lungs: Poor air movement throughout all fields with mild bibasilar crackles but no active wheeze at time of my exam ?Cardiovascular: Distant heart sounds,  irregular, tachycardic at 95 bpm ?Abdomen: Obese, soft, no rebound, no organomegaly ?Extremities: Trace bilateral lower extremity edema ? ?CBC: ?Recent Labs  ?Lab 11/20/21 ?0446 11/20/21 ?7106 11/20/21 ?2055 11/21/21 ?2694  ?WBC 18.5*  --   --  17.1*  ?NEUTROABS 16.9*  --   --   --   ?HGB 11.6* 11.6* 10.9* 9.9*  ?HCT 36.6* 34.0* 34.1* 31.0*  ?MCV 85.5  --   --   85.4  ?PLT 267  --   --  253  ? ?Basic Metabolic Panel: ?Recent Labs  ?Lab 11/20/21 ?0446 11/20/21 ?8546 11/21/21 ?2703  ?NA 133* 136 131*  ?K 3.6 3.7 3.2*  ?CL 91*  --  94*  ?CO2 34*  --  30  ?GLUCOSE 212*  --  262*  ?BUN 17  --  17  ?CREATININE 1.50*  --  1.21  ?CALCIUM 8.5*  --  8.3*  ? ?GFR: ?Estimated Creatinine Clearance: 60.7 mL/min (by C-G formula based on SCr of 1.21 mg/dL). ? ?Liver Function Tests: ?Recent Labs  ?Lab 11/20/21 ?0446  ?AST 20  ?ALT 14  ?ALKPHOS 77  ?BILITOT 1.3*  ?PROT 6.5  ?ALBUMIN 3.1*  ? ? ?Coagulation Profile: ?Recent Labs  ?Lab 11/20/21 ?0446 11/20/21 ?2055  ?INR 1.5* 1.4*  ? ? ?Scheduled Meds: ? ALPRAZolam  1.5 mg Oral QHS  ? arformoterol  15 mcg Nebulization BID  ? budesonide  0.5 mg Nebulization Daily  ? docusate sodium  100 mg Oral BID  ? escitalopram  10 mg Oral Daily  ? flecainide  75 mg Oral BID  ? methylPREDNISolone (SOLU-MEDROL) injection  80 mg Intravenous Q12H  ? metoprolol tartrate  12.5 mg Oral BID  ? pantoprazole  40 mg Oral Daily  ? revefenacin  175 mcg Nebulization Daily  ? rosuvastatin  5 mg Oral QHS  ? sodium chloride flush  3 mL Intravenous Q12H  ? ?Continuous Infusions: ? sodium chloride 75 mL/hr at 11/20/21 1154  ? azithromycin 500 mg (11/21/21 0341)  ? cefTRIAXone (ROCEPHIN)  IV 2 g (11/21/21 0544)  ? ? ? LOS: 1 day  ? ?Cherene Altes, MD ?[facility] ?Office  [phone] ?Pager - Text Page per Shea Evans ? ?If 7PM-7AM, please contact night-coverage per Amion ?11/21/2021, 10:06 AM ? ? ? ?

## 2021-11-21 NOTE — Progress Notes (Addendum)
OT Cancellation Note ? ?Patient Details ?Name: Sean Riley ?MRN: [id number] ?DOB: 08/19/1950 ? ? ?Cancelled Treatment:    Reason Eval/Treat Not Completed: Other (comment) (RN requesting hold as pt with increased WOB and chest pain. RN paged MD and awaiting instructions. Will return as schedule allows.) ? ?Addendum 13:41 - Discussed with Dr. Thereasa Solo, hold therapy today. Will return tomorrow for evaluation. Thank you.  ? ?[place] ?Bion Todorov MSOT, OTR/L ?Acute Rehab ?Pager: [phone] ?Office: [phone] ?11/21/2021, 11:08 AM ?

## 2021-11-21 NOTE — TOC Progression Note (Signed)
Transition of Care (TOC) - Progression Note  ? ? ?Patient Details  ?Name: Sean Riley ?MRN: [id number] ?Date of Birth: [birth year] ? ?Transition of Care (TOC) CM/SW Contact  ?Angelita Ingles, RN ?Phone Number:[phone] ? ?11/21/2021, 4:01 PM ? ?Clinical Narrative:    ? ?Transition of Care (TOC) Screening Note ? ? ?Patient Details  ?Name: Sean Riley ?Date of Birth: 1951/01/20 ? ? ?Transition of Care (TOC) CM/SW Contact:    ?Angelita Ingles, RN ?Phone Number: ?11/21/2021, 4:02 PM ? ? ? ?Transition of Care Department [facility]) has reviewed patient and no TOC needs have been identified at this time. We will continue to monitor patient advancement through interdisciplinary progression rounds.  ? ? ? ? ? ?  ?  ? ?Expected Discharge Plan and Services ?  ?  ?  ?  ?  ?                ?  ?  ?  ?  ?  ?  ?  ?  ?  ?  ? ? ?Social Determinants of Health (SDOH) Interventions ?  ? ?Readmission Risk Interventions ?   ? View : No data to display.  ?  ?  ?  ? ? ?

## 2021-11-21 NOTE — Progress Notes (Signed)
PT Cancellation Note ? ?Patient Details ?Name: Sean Riley ?MRN: [id number] ?DOB: 04-Mar-1951 ? ? ?Cancelled Treatment:    Reason Eval/Treat Not Completed: Patient not medically ready. RN requesting hold as pt with increased WOB and chest pain. RN paged MD and awaiting instructions. Will return as schedule allows. ? ? ?Moishe Spice, PT, DPT ?Acute Rehabilitation Services  ?Pager: [phone] ?Office: [phone] ? ? ? ?Sean Riley ?11/21/2021, 11:17 AM ? ? ?

## 2021-11-21 NOTE — Progress Notes (Addendum)
? ?NAME:  Sean Riley, MRN:  [id number], DOB:  Aug 15, 1950, LOS: 1 ?ADMISSION DATE:  11/20/2021, CONSULTATION DATE:  4/13 ?REFERRING MD:  Sean Riley, CHIEF COMPLAINT:  SOB  ? ?History of Present Illness:  ?Sean Riley, is a 71 y.o. male, who presented to the [facility] ED with a chief complaint of  SOB ? ?They have a pertinent past medical history of COPD on 5 to 6 L home O2, bilateral pleural effusions, CHF chronic respiratory failure, atrial fibrillation on Eliquis, AAA s/p endovascular repair in 2019, AVR with bioprosthetic valve, HLD, HTN, GIB ? ?Recent admission 2/17 - 10/03/2021 for COPD exacerbation.  Recently being seen by [facility] Riley palliative care.  Patient states that they have not been by yet.  EMS called the evening of 4/12 for shortness of breath, reports of home oxygen sat being 70% on room air.  Unclear if patient was wearing oxygen or not. ? ?Chest x-ray in the ED concerning for multilobar bilateral pneumonia greater in the left than right, moderate bilateral pleural effusions, imaging similar to previous admission.  WBC 18.5, lactate 1.4. ABG 7.4 4/50/70/34.  Patient started on ceftriaxone and azithromycin.  Admitted to the hospitalist service. ? ?Patient work of breathing increased while in the emergency department was briefly placed on BiPAP.  On exam by PCCM was back on 6 L O2 per nasal cannula (baseline),  ? ?PCCM was consulted for assistance with management ? ? ?Pertinent  Medical History  ?COPD on 5 to 6 L home O2, bilateral pleural effusions, CHF chronic respiratory failure, atrial fibrillation on Eliquis, AAA s/p endovascular repair in 2019, AVR with bioprosthetic valve, HLD, HTN, GIB ? ?Significant Hospital Events: ?Including procedures, antibiotic start and stop dates in addition to other pertinent events   ?4/13 presented to [facility] ED w/ COPD exacerbation ? ?Interim History / Subjective:  ?No significant change from 11/20/2021 although he went from 5 L to 8 L of oxygen ? ?Objective   ?Blood pressure  115/61, pulse 86, temperature 97.7 ?F (36.5 ?C), temperature source Oral, resp. rate 14, height '6\' 1"'$  (1.854 m), weight 76.6 kg, SpO2 95 %. ?   ?   ? ?Intake/Output Summary (Last 24 hours) at 11/21/2021 1033 ?Last data filed at 11/21/2021 0700 ?Gross per 24 hour  ?Intake 1443.45 ml  ?Output 751 ml  ?Net 692.45 ml  ? ?Filed Weights  ? 11/20/21 0438  ?Weight: 76.6 kg  ? ? ?Examination: ?General: Frail male who appears older than stated age ?HEENT: MM pink/moist no JVD or lymphadenopathy is appreciated ?Neuro: Dull effect, hard of hearing, not sure if he understands what is going on. ?CV: Heart sounds are distant ?PULM: Shallow respirations decreased air movement ?Gi bowel sounds are active  ?GU: Amber urine ?Extremities: warm/dry, 2+ edema  ?Skin: no rashes or lesions ? ? ?Resolved Hospital Problem list   ? ? ?Assessment & Plan:  ?Acute COPD exacerbation ?Acute on chronic respiratory failure, secondary to COPD exacerbation, on 6L O2 at baseline ?History of diastolic congestive heart failure ?Chronic pleural effusions, partially loculated,  secondary to CHF ?ABG 7.4 4/50/70/34. Chest x-ray in the ED concerning for multilobar bilateral pneumonia greater in the left than right, moderate bilateral pleural effusions, imaging similar to previous admission.  Reports being seen by [facility] palliative care, per patient unclear what has been done by that service at this time.  Patient has returned to home oxygen requirement on exam.  No signs of volume overload.  No wheezing. Patient alone on exam.  Attempted to  do goals of care conversation but unclear if patient understood discussions that were occurring. ? ?Continue current antibiotics of ceftriaxone and Zithromax. ?Monitor culture data ?Continue bronchodilators ?Tolerated noninvasive mechanical ventilatory support for 3 to 4 hours during the night ?Continue prednisone ?Pulmonary toilet he does not have a flutter valve or incentive spirometer this was ordered and given to him today  11/21/2021 ?Continue to explore goals of care although when talking to him he looks like he is spaced out ? ? ? ?All other issues per primary ?Hypokalemia, hyponatremia, per primary ?Best Practice (right click and "Reselect all SmartList Selections" daily)  ? ?Per primary ?Last date of multidisciplinary goals of care discussion [Pending] ? ?Labs   ?CBC: ?Recent Labs  ?Lab 11/20/21 ?0446 11/20/21 ?0454 11/20/21 ?2055 11/21/21 ?0981  ?WBC 18.5*  --   --  17.1*  ?NEUTROABS 16.9*  --   --   --   ?HGB 11.6* 11.6* 10.9* 9.9*  ?HCT 36.6* 34.0* 34.1* 31.0*  ?MCV 85.5  --   --  85.4  ?PLT 267  --   --  253  ? ? ?Basic Metabolic Panel: ?Recent Labs  ?Lab 11/20/21 ?0446 11/20/21 ?1914 11/21/21 ?7829  ?NA 133* 136 131*  ?K 3.6 3.7 3.2*  ?CL 91*  --  94*  ?CO2 34*  --  30  ?GLUCOSE 212*  --  262*  ?BUN 17  --  17  ?CREATININE 1.50*  --  1.21  ?CALCIUM 8.5*  --  8.3*  ? ?GFR: ?Estimated Creatinine Clearance: 60.7 mL/min (by C-G formula based on SCr of 1.21 mg/dL). ?Recent Labs  ?Lab 11/20/21 ?0446 11/20/21 ?0656 11/21/21 ?5621  ?PROCALCITON  --  0.51 0.37  ?WBC 18.5*  --  17.1*  ?LATICACIDVEN 1.4  --   --   ? ? ?Liver Function Tests: ?Recent Labs  ?Lab 11/20/21 ?0446  ?AST 20  ?ALT 14  ?ALKPHOS 77  ?BILITOT 1.3*  ?PROT 6.5  ?ALBUMIN 3.1*  ? ?No results for input(s): LIPASE, AMYLASE in the last 168 hours. ?No results for input(s): AMMONIA in the last 168 hours. ? ?ABG ?   ?Component Value Date/Time  ? PHART 7.442 11/20/2021 0633  ? PCO2ART 50.3 (H) 11/20/2021 3086  ? PO2ART 70 (L) 11/20/2021 5784  ? HCO3 34.4 (H) 11/20/2021 6962  ? TCO2 36 (H) 11/20/2021 9528  ? ACIDBASEDEF 2.0 03/25/2017 0742  ? O2SAT 95 11/20/2021 0633  ?  ? ?Coagulation Profile: ?Recent Labs  ?Lab 11/20/21 ?0446 11/20/21 ?2055  ?INR 1.5* 1.4*  ? ? ?Cardiac Enzymes: ?No results for input(s): CKTOTAL, CKMB, CKMBINDEX, TROPONINI in the last 168 hours. ? ?HbA1C: ?Hgb A1c MFr Bld  ?Date/Time Value Ref Range Status  ?08/16/2021 01:49 AM 5.5 4.8 - 5.6 % Final  ?   Comment:  ?  (NOTE) ?Pre diabetes:          5.7%-6.4% ? ?Diabetes:              >6.4% ? ?Glycemic control for   <7.0% ?adults with diabetes ?  ?01/25/2021 02:45 AM 4.4 (L) 4.8 - 5.6 % Final  ?  Comment:  ?  (NOTE) ?        Prediabetes: 5.7 - 6.4 ?        Diabetes: >6.4 ?        Glycemic control for adults with diabetes: <7.0 ?  ? ? ?CBG: ?No results for input(s): GLUCAP in the last 168 hours. ? ? ? ?Richardson Landry Minor ACNP ?Acute Care Nurse Practitioner ?  [place] ?Please consult Amion ?11/21/2021, 10:33 AM ? ? ?Attending attestation: ?Mr. Teutsch is a 71 y/o gentleman with a history of advanced stage COPD, chronic bilateral pleural effusions, aortic aneurysms, chronic HFpEF who has been previously enrolled in hospice for advanced lung disease. He presented with worsening dyspnea yesterday. This morning he had CP and increased SOB that started suddenly and resolved after about 20 min on its own. He has had this happen at home a few times. His pain is left-sided CP, and he denies radiation of the pain to his shoulder, jaw, or neck. He feels better when he uses bipap, but has not needed it this morning. He feels better at home when he uses the ativan and oral morphine. ? ?BP 115/61   Pulse 86   Temp 97.7 ?F (36.5 ?C) (Oral)   Resp 14   Ht '6\' 1"'$  (1.854 m)   Wt 76.6 kg   SpO2 95%   BMI 22.28 kg/m?  ?Chronically ill appearing man lying in bed in NAD ?[place]/AT, eyes anicteric ?Breathing comfortably on [place], minimal wheezing, rhales L>R ?S1S2, RRR ?Abd soft, NT ?+LE edema, no cyanosis ?Skin warm, no rashes. ? ?Blood culture> 2/4 (1 site) strep gallolyticus ?Pct 0.51> 0.37 ?Na+ 132 ?K+ 3.2 ?BUN 17 ?Cr 1.21 ?WBC 17.1 ?H/H 9.9/31 ? ?CXR personally reviewed > left sided increased markings in LUL, concerning for pulm edema. Persistent effusions. Aortic aneurysm and graft. ? ?Assessment & plan: ?Acute on chronic respiratory failure with hypoxia ?COPD ?Concern for acute pulmonary edema ?No obvious pneumonia, but  possible with leukocytosis ?-diuresis ?-bronchodilators ?-prednisone ?-ok to con't antibiotics given indeterminate CXR ?-bipap PRN for comfort ?-con't morphine and ativan PRN for air hunger ?-Discussed code status-

## 2021-11-21 NOTE — Progress Notes (Signed)
Initial Nutrition Assessment ? ?DOCUMENTATION CODES:  ? ?Non-severe (moderate) malnutrition in context of chronic illness ? ?INTERVENTION:  ?- Encourage PO intake ? ?- Ensure Enlive po BID, each supplement provides 350 kcal and 20 grams of protein. ? ?- MVI with minerals daily ? ?NUTRITION DIAGNOSIS:  ? ?Moderate Malnutrition related to chronic illness (COPD) as evidenced by moderate fat depletion, severe muscle depletion. ? ?GOAL:  ? ?Patient will meet greater than or equal to 90% of their needs ? ?MONITOR:  ? ?PO intake ? ?REASON FOR ASSESSMENT:  ? ?Consult ?Other (Comment) (nutritional goals) ? ?ASSESSMENT:  ? ?Pt admitted with respiratory distress secondary to sepsis d/t PNA. PMH significant for afib, HLD, HTN, COPD on 6L home O2 and on hospice, aortic dissection repair, and pleural effusions. ? ?Noted if needed, pt is agreeable to short term intubation but would not want tube feeding.  ? ?Spoke with pt and his daughter at bedside. He endorses a poor appetite and decrease in PO intake since January d/t increase in illness and difficulty breathing. Despite this, he still has 3 meals per day but the portions are less. He reports having to slow down when eating and need of chewing foods real well to help with tolerance of swallowing foods. He enjoys the hospital food and says he is eating more than usual during admission. He had grits this morning but was disappointed he did not have meat. His daughter placed order this morning for the next 3 meals to ensure he gets foods he enjoys.  ? ?Meal completion: ?04/14: 100%-breakfast ? ?Discussed diet liberalization to provide more options for nutritional intake given poor appetite and PO intake x 3 months and presence of malnutrition. Will also add Ensure as pt typically drinks 1-2 at home. ? ?Pt states that around November his weight was ~193 lbs and endorses weight loss within the last 3 months. Reviewed weight history. Pt noted to have a 12% weight loss from  05/26/21-11/20/21 which is clinically significant for time frame, however given h/o CHF uncertain how much of this is true dry weight loss.  ? ?Medications: colace, protonix ?IV drips: zithromax, rocephin ? ?Labs: sodium 131, potassium 3.2 (replacing), ionized calcium 1.07 ? ?NUTRITION - FOCUSED PHYSICAL EXAM: ? ?Flowsheet Row Most Recent Value  ?Orbital Region Moderate depletion  ?Upper Arm Region Moderate depletion  ?Thoracic and Lumbar Region Mild depletion  ?Buccal Region Mild depletion  ?Temple Region Moderate depletion  ?Clavicle Bone Region Moderate depletion  ?Clavicle and Acromion Bone Region Severe depletion  ?Scapular Bone Region Mild depletion  ?Dorsal Hand Moderate depletion  ?Patellar Region Severe depletion  ?Anterior Thigh Region Severe depletion  ?Posterior Calf Region Severe depletion  ?Edema (RD Assessment) None  ?Hair Reviewed  ?Eyes Reviewed  ?Mouth Reviewed  ?Skin Reviewed  ?Nails Reviewed  ? ?  ? ? ?Diet Order:   ?Diet Order   ? ?       ?  Diet regular Room service appropriate? Yes; Fluid consistency: Thin  Diet effective now       ?  ? ?  ?  ? ?  ? ? ?EDUCATION NEEDS:  ? ?Education needs have been addressed ? ?Skin:  Skin Assessment: Reviewed RN Assessment ? ?Last BM:  4/13 (type 3) ? ?Height:  ? ?Ht Readings from Last 1 Encounters:  ?11/20/21 '6\' 1"'$  (1.854 m)  ? ? ?Weight:  ? ?Wt Readings from Last 1 Encounters:  ?11/20/21 76.6 kg  ? ?BMI:  Body mass index is 22.28 kg/m?Marland Kitchen ? ?  Estimated Nutritional Needs:  ? ?Kcal:  2200-2400 ? ?Protein:  110-125g ? ?Fluid:  >/=2.2L ? ?Clayborne Dana, RDN, LDN ?Clinical Nutrition ?

## 2021-11-21 NOTE — Plan of Care (Signed)
?  Interdisciplinary Goals of Care Family Meeting ? ? ?Date carried out:: 11/21/2021 ? ?Location of the meeting: Bedside ? ?Member's involved: Physician, Bedside Registered Nurse, and Family Member or next of kin ? ?Durable Power of Tour manager: patient making his own decisions   ? ?Discussion: We discussed goals of care for [employer] .  His daughter, wife, son in law were at bedside during the discussion as well as his bedside RN. We discussed his advanced disease and the need to prioritize controlling his symptoms since he has multiple irreversible disease processes. If he were to decompensate acutely, I do not think that resuscitation or intubation would change his long-term outcome. His daughter and wife agree. He agrees that in the event of acute decline, focusing his care on his symptoms and his family being present would be a priority. His wife has seen a family member pass away after resuscitation on life support and does not want this for her husband. We discussed signing a [place] form to make sure that his wishes can be followed no matter where he is.  ? ?Code status: Full DNR, ok for BiPAP for symptoms ? ?Disposition: Continue current acute care . Primary team updated, RN present during DNR discussion. Orders updated in epic. ?   ? ?Time spent for the meeting: 40 min. ? ?[name] ?11/21/2021, 11:57 AM ? ? ?

## 2021-11-21 NOTE — Plan of Care (Signed)
?  Problem: Education: ?Goal: Knowledge of disease or condition will improve ?Outcome: Progressing ?Goal: Knowledge of the prescribed therapeutic regimen will improve ?Outcome: Progressing ?  ?Problem: Respiratory: ?Goal: Ability to maintain a clear airway will improve ?Outcome: Progressing ?Goal: Ability to maintain adequate ventilation will improve ?Outcome: Progressing ?  ?Problem: Clinical Measurements: ?Goal: Ability to maintain a body temperature in the normal range will improve ?Outcome: Progressing ?  ?Problem: Respiratory: ?Goal: Ability to maintain adequate ventilation will improve ?Outcome: Progressing ?Goal: Ability to maintain a clear airway will improve ?Outcome: Progressing ?  ?Problem: Education: ?Goal: Knowledge of General Education information will improve ?Description: Including pain rating scale, medication(s)/side effects and non-pharmacologic comfort measures ?Outcome: Progressing ?  ?Problem: Health Behavior/Discharge Planning: ?Goal: Ability to manage health-related needs will improve ?Outcome: Progressing ?  ?Problem: Clinical Measurements: ?Goal: Respiratory complications will improve ?Outcome: Progressing ?Goal: Cardiovascular complication will be avoided ?Outcome: Progressing ?  ?Problem: Elimination: ?Goal: Will not experience complications related to urinary retention ?Outcome: Progressing ?  ?Problem: Pain Managment: ?Goal: General experience of comfort will improve ?Outcome: Progressing ?  ?Problem: Activity: ?Goal: Ability to tolerate increased activity will improve ?Outcome: Not Progressing ?Goal: Will verbalize the importance of balancing activity with adequate rest periods ?Outcome: Not Progressing ?  ?Problem: Respiratory: ?Goal: Levels of oxygenation will improve ?Outcome: Not Progressing ?  ?Problem: Activity: ?Goal: Ability to tolerate increased activity will improve ?Outcome: Not Progressing ?  ?Problem: Clinical Measurements: ?Goal: Ability to maintain clinical measurements  within normal limits will improve ?Outcome: Not Progressing ?Goal: Diagnostic test results will improve ?Outcome: Not Progressing ?  ?Problem: Activity: ?Goal: Risk for activity intolerance will decrease ?Outcome: Not Progressing ?  ?Problem: Nutrition: ?Goal: Adequate nutrition will be maintained ?Outcome: Not Progressing ?  ?Problem: Coping: ?Goal: Level of anxiety will decrease ?Outcome: Not Progressing ?  ?

## 2021-11-21 NOTE — Progress Notes (Signed)
?   11/20/21 2207  ?Assess: MEWS Score  ?BP 104/74  ?Pulse Rate (!) 145  ?ECG Heart Rate (!) 147  ?Resp (!) 26  ?Level of Consciousness Alert  ?SpO2 90 %  ?O2 Device HFNC  ?Patient Activity (if Appropriate) In bed  ?O2 Flow Rate (L/min) 4 L/min  ?Assess: MEWS Score  ?MEWS Temp 0  ?MEWS Systolic 0  ?MEWS Pulse 3  ?MEWS RR 2  ?MEWS LOC 0  ?MEWS Score 5  ?MEWS Score Color Red  ?Assess: if the MEWS score is Yellow or Red  ?Were vital signs taken at a resting state? Yes  ?Focused Assessment No change from prior assessment  ?Early Detection of Sepsis Score *See Row Information* Low  ?MEWS guidelines implemented *See Row Information* Yes  ?Treat  ?MEWS Interventions Escalated (See documentation below);Administered scheduled meds/treatments  ?Pain Scale 0-10  ?Pain Score 0  ?Take Vital Signs  ?Increase Vital Sign Frequency  Red: Q 1hr X 4 then Q 4hr X 4, if remains red, continue Q 4hrs  ?Escalate  ?MEWS: Escalate Red: discuss with charge nurse/RN and provider, consider discussing with RRT  ?Notify: Charge Nurse/RN  ?Name of Charge Nurse/RN Notified Luther Redo, RN  ?Date Charge Nurse/RN Notified 11/20/21  ?Time Charge Nurse/RN Notified 2207  ?Notify: Provider  ?Provider Name/Title Dr. Alcario Drought  ?Date Provider Notified 11/20/21  ?Time Provider Notified 2207  ?Notification Type Face-to-face  ?Notification Reason Change in status  ?Provider response In department;At bedside  ?Date of Provider Response 11/20/21  ?Time of Provider Response 2207  ?Notify: Rapid Response  ?Name of Rapid Response RN Notified [facility]  ?Date Rapid Response Notified 11/20/21  ?Time Rapid Response Notified 2207  ?Document  ?Patient Outcome Stabilized after interventions  ?Progress note created (see row info) Yes  ? ? ?

## 2021-11-21 NOTE — Progress Notes (Signed)
2200: Patient found to be in Afib RVR with HR up in 140s-150s. EKG obtained. Rapid RN and Dr. Alcario Drought on the department and made aware. Scheduled metoprolol and flecanide given. Plan to start cardizem gtt if HR not controlled.   ? ?0000: Patient in NSR with HR in 70s-80s. MD made aware.  ?

## 2021-11-22 ENCOUNTER — Inpatient Hospital Stay (HOSPITAL_COMMUNITY): Payer: Medicare Other

## 2021-11-22 DIAGNOSIS — J449 Chronic obstructive pulmonary disease, unspecified: Secondary | ICD-10-CM | POA: Diagnosis not present

## 2021-11-22 DIAGNOSIS — A419 Sepsis, unspecified organism: Secondary | ICD-10-CM | POA: Diagnosis not present

## 2021-11-22 DIAGNOSIS — J441 Chronic obstructive pulmonary disease with (acute) exacerbation: Secondary | ICD-10-CM | POA: Diagnosis not present

## 2021-11-22 DIAGNOSIS — J189 Pneumonia, unspecified organism: Secondary | ICD-10-CM | POA: Diagnosis not present

## 2021-11-22 LAB — COMPREHENSIVE METABOLIC PANEL
ALT: 20 U/L (ref 0–44)
AST: 22 U/L (ref 15–41)
Albumin: 2.6 g/dL — ABNORMAL LOW (ref 3.5–5.0)
Alkaline Phosphatase: 65 U/L (ref 38–126)
Anion gap: 7 (ref 5–15)
BUN: 16 mg/dL (ref 8–23)
CO2: 29 mmol/L (ref 22–32)
Calcium: 8.4 mg/dL — ABNORMAL LOW (ref 8.9–10.3)
Chloride: 99 mmol/L (ref 98–111)
Creatinine, Ser: 0.97 mg/dL (ref 0.61–1.24)
GFR, Estimated: >60 mL/min (ref >60)
Glucose, Bld: 164 mg/dL — ABNORMAL HIGH (ref 70–99)
Potassium: 4.5 mmol/L (ref 3.5–5.1)
Sodium: 135 mmol/L (ref 135–145)
Total Bilirubin: 0.5 mg/dL (ref 0.3–1.2)
Total Protein: 5.6 g/dL — ABNORMAL LOW (ref 6.5–8.1)

## 2021-11-22 LAB — CBC
HCT: 31.3 % — ABNORMAL LOW (ref 39.0–52.0)
Hemoglobin: 9.8 g/dL — ABNORMAL LOW (ref 13.0–17.0)
MCH: 26.8 pg (ref 26.0–34.0)
MCHC: 31.3 g/dL (ref 30.0–36.0)
MCV: 85.8 fL (ref 80.0–100.0)
Platelets: 278 10*3/uL (ref 150–400)
RBC: 3.65 MIL/uL — ABNORMAL LOW (ref 4.22–5.81)
RDW: 16.8 % — ABNORMAL HIGH (ref 11.5–15.5)
WBC: 17.1 10*3/uL — ABNORMAL HIGH (ref 4.0–10.5)
nRBC: 0 % (ref 0.0–0.2)

## 2021-11-22 LAB — CULTURE, BLOOD (ROUTINE X 2)
Special Requests: ADEQUATE
Special Requests: ADEQUATE

## 2021-11-22 LAB — PROCALCITONIN: Procalcitonin: 0.21 ng/mL

## 2021-11-22 MED ORDER — FUROSEMIDE 10 MG/ML IJ SOLN
40.0000 mg | Freq: Once | INTRAMUSCULAR | Status: AC
  Administered 2021-11-22: 40 mg via INTRAVENOUS
  Filled 2021-11-22: qty 4

## 2021-11-22 MED ORDER — SODIUM CHLORIDE 0.9 % IV SOLN
2.0000 g | Freq: Four times a day (QID) | INTRAVENOUS | Status: DC
  Administered 2021-11-23 – 2021-11-24 (×5): 2 g via INTRAVENOUS
  Filled 2021-11-22 (×6): qty 2000

## 2021-11-22 NOTE — Progress Notes (Signed)
CSW acknowledges consult for SNF/HH. The patient will require PT/OT evaluations. TOC will assist with disposition planning once the evaluations have been completed.  °  °TOC will continue to follow.    °

## 2021-11-22 NOTE — Evaluation (Addendum)
Physical Therapy Evaluation Patient Details Name: Sean Riley MRN: [id number] DOB: 04-08-1951 Today's Date: 11/22/2021  History of Present Illness  Pt is a 71 y.o. male who presented 11/20/21 with acute on chronic respiratory failure secondary to aspiration PNA and COPD exacerbation. PMH: COPD on 5 to 6 L home O2, bilateral pleural effusions, CHF chronic respiratory failure, atrial fibrillation on Eliquis, AAA s/p endovascular repair in 2019, AVR with bioprosthetic valve, HLD, HTN, GIB   Clinical Impression  Pt presents with condition above and deficits mentioned below, see PT Problem List. PTA, he was living with his wife in a 1-level house with 3-4 STE. He reports he does not leave the house often, but ambulates short distances in the home without AD or assistance at baseline. Currently, pt is limited in aerobic endurance/activity tolerance with his SpO2 decreasing to low 80s% on 8-10L O2 while ambulating, needing a couple minutes to sit and rest on 15L O2 to recover. He displays deficits in balance and strength also, but is able to perform all functional mobility, including short gait bouts, at a min guard assist level with a RW. Recommending HHPT at follow-up to address his deficits to improve his endurance and independence with functional mobility and thus improve qualify of time. Will continue to follow acutely.       Recommendations for follow up therapy are one component of a multi-disciplinary discharge planning process, led by the attending physician.  Recommendations may be updated based on patient status, additional functional criteria and insurance authorization.  Follow Up Recommendations Home health PT    Assistance Recommended at Discharge Frequent or constant Supervision/Assistance  Patient can return home with the following  A little help with walking and/or transfers;A little help with bathing/dressing/bathroom;Assistance with cooking/housework;Assist for transportation;Help  with stairs or ramp for entrance    Equipment Recommendations Wheelchair (measurements PT);Wheelchair cushion (measurements PT)  Recommendations for Other Services       Functional Status Assessment Patient has had a recent decline in their functional status and demonstrates the ability to make significant improvements in function in a reasonable and predictable amount of time.     Precautions / Restrictions Precautions Precautions: Fall;Other (comment) Precaution Comments: Watch SpO2 (5-8L O2 baseline) Restrictions Weight Bearing Restrictions: No      Mobility  Bed Mobility Overal bed mobility: Needs Assistance Bed Mobility: Supine to Sit, Sit to Supine     Supine to sit: Supervision, HOB elevated Sit to supine: Supervision, HOB elevated   General bed mobility comments: Supervision for safety, HOB elevated, extra time to lift legs onto bed to supine    Transfers Overall transfer level: Needs assistance Equipment used: Rolling walker (2 wheels) Transfers: Sit to/from Stand Sit to Stand: Min guard, +2 safety/equipment, From elevated surface           General transfer comment: Bed raised due to pt having long legs, min guard for safety, no LOB.    Ambulation/Gait Ambulation/Gait assistance: Min guard, +2 safety/equipment Gait Distance (Feet): 20 Feet (x2 bouts of ~20 ft > ~3 ft) Assistive device: Rolling walker (2 wheels) Gait Pattern/deviations: Step-through pattern, Decreased stride length, Trunk flexed Gait velocity: reduced Gait velocity interpretation: <1.8 ft/sec, indicate of risk for recurrent falls   General Gait Details: Pt with slow gait and flexed posture, only tolerating short distances at a time secondary to decline in SpO2 and fatigue. Min guard for safety, no LOB  Careers information officer  Modified Rankin (Stroke Patients Only)       Balance Overall balance assessment: Needs assistance Sitting-balance support: No upper  extremity supported, Feet supported Sitting balance-[name]: Fair     Standing balance support: Bilateral upper extremity supported, During functional activity, Reliant on assistive device for balance Standing balance-[name]: Poor Standing balance comment: Reliant on UE support                             Pertinent Vitals/Pain Pain Assessment Pain Assessment: Faces Faces Pain Scale: Hurts a little bit Pain Location: generalized with mobility Pain Descriptors / Indicators: Grimacing Pain Intervention(s): Limited activity within patient's tolerance, Monitored during session, Repositioned    Home Living Family/patient expects to be discharged to:: Private residence Living Arrangements: Spouse/significant other Available Help at Discharge: Family;Available 24 hours/day Type of Home: House Home Access: Stairs to enter Entrance Stairs-Rails: Can reach both Entrance Stairs-Number of Steps: 3-4   Home Layout: One level Home Equipment: Agricultural consultant (2 wheels);Cane - single point;Rollator (4 wheels);Shower seat;BSC/3in1;Grab bars - tub/shower Additional Comments: Planning to get bathroom renovated to be w/c accessible    Prior Function Prior Level of Function : Needs assist             Mobility Comments: Does not go outside much. Normally just walks short distances in the home without AD ADLs Comments: Wife assists with pants, but otherwise pt can dress top half independently.     Hand Dominance        Extremity/Trunk Assessment   Upper Extremity Assessment Upper Extremity Assessment: Defer to OT evaluation    Lower Extremity Assessment Lower Extremity Assessment: Generalized weakness    Cervical / Trunk Assessment Cervical / Trunk Assessment: Kyphotic  Communication   Communication: HOH  Cognition Arousal/Alertness: Awake/alert Behavior During Therapy: WFL for tasks assessed/performed Overall Cognitive Status: Within Functional Limits for  tasks assessed                                 General Comments: Follows commands appropriately and self-identifies his limits.        General Comments General comments (skin integrity, edema, etc.): SpO2 down to low 80s% on 8-10L O2 while ambulating, needing <2 min and 15L O2 while sitting to recover SpO2 >/= 90%    Exercises     Assessment/Plan    PT Assessment Patient needs continued PT services  PT Problem List Decreased strength;Decreased activity tolerance;Decreased mobility;Decreased balance;Cardiopulmonary status limiting activity       PT Treatment Interventions DME instruction;Gait training;Stair training;Functional mobility training;Therapeutic activities;Therapeutic exercise;Balance training;Neuromuscular re-education;Patient/family education    PT Goals (Current goals can be found in the Care Plan section)  Acute Rehab PT Goals Patient Stated Goal: to get better PT Goal Formulation: With patient/family Time For Goal Achievement: 12/06/21 Potential to Achieve Goals: Good    Frequency Min 3X/week     Co-evaluation PT/OT/SLP Co-Evaluation/Treatment: Yes Reason for Co-Treatment: For patient/therapist safety;To address functional/ADL transfers;Other (comment) (likely to not tolerate x2 sessions) PT goals addressed during session: Mobility/safety with mobility;Proper use of DME;Balance         AM-PAC PT "6 Clicks" Mobility  Outcome Measure Help needed turning from your back to your side while in a flat bed without using bedrails?: A Little Help needed moving from lying on your back to sitting on the side of a flat bed without using bedrails?: A  Little Help needed moving to and from a bed to a chair (including a wheelchair)?: A Little Help needed standing up from a chair using your arms (e.g., wheelchair or bedside chair)?: A Little Help needed to walk in hospital room?: A Little Help needed climbing 3-5 steps with a railing? : A Lot 6 Click Score:  17    End of Session Equipment Utilized During Treatment: Gait belt;Oxygen Activity Tolerance: Patient limited by fatigue;Patient tolerated treatment well Patient left: in bed;with call bell/phone within reach;with bed alarm set;with family/visitor present   PT Visit Diagnosis: Unsteadiness on feet (R26.81);Other abnormalities of gait and mobility (R26.89);Muscle weakness (generalized) (M62.81);Difficulty in walking, not elsewhere classified (R26.2)    Time: 1345-1405 PT Time Calculation (min) (ACUTE ONLY): 20 min   Charges:   PT Evaluation $PT Eval Moderate Complexity: 1 Mod          Raymond Gurney, PT, DPT Acute Rehabilitation Services  Pager: [phone] Office: [phone]   Jewel Baize 11/22/2021, 2:17 PM

## 2021-11-22 NOTE — Evaluation (Signed)
Occupational Therapy Evaluation ?Patient Details ?Name: Sean Riley ?MRN: [id number] ?DOB: 1951-02-14 ?Today's Date: 11/22/2021 ? ? ?History of Present Illness Pt is a 71 y.o. male who presented 11/20/21 with acute on chronic respiratory failure secondary to aspiration PNA and COPD exacerbation. PMH: COPD on 5 to 6 L home O2, bilateral pleural effusions, CHF chronic respiratory failure, atrial fibrillation on Eliquis, AAA s/p endovascular repair in 2019, AVR with bioprosthetic valve, HLD, HTN, GIB  ? ?Clinical Impression ?  ?PTA, pt was living with his wife who assisted with LB ADLs as needed. Pt currently requiring Min A for UB ADLs, Mod A for LB ADLs, and Min guard A for functional mobility with RW. Pt presenting with decreased activity tolerance and requiring increased supplemental O2. Pt would benefit from further acute OT to facilitate safe dc. Recommend dc to home with HHOT for further OT to optimize safety, independence with ADLs, and return to PLOF.  ?   ? ?Recommendations for follow up therapy are one component of a multi-disciplinary discharge planning process, led by the attending physician.  Recommendations may be updated based on patient status, additional functional criteria and insurance authorization.  ? ?Follow Up Recommendations ? Home health OT  ?  ?Assistance Recommended at Discharge Frequent or constant Supervision/Assistance  ?Patient can return home with the following A little help with walking and/or transfers;A little help with bathing/dressing/bathroom ? ?  ?Functional Status Assessment ? Patient has had a recent decline in their functional status and demonstrates the ability to make significant improvements in function in a reasonable and predictable amount of time.  ?Equipment Recommendations ? None recommended by OT  ?  ?Recommendations for Other Services   ? ? ?  ?Precautions / Restrictions Precautions ?Precautions: Fall;Other (comment) ?Precaution Comments: Watch SpO2 (5-8L O2  baseline) ?Restrictions ?Weight Bearing Restrictions: No  ? ?  ? ?Mobility Bed Mobility ?Overal bed mobility: Needs Assistance ?Bed Mobility: Supine to Sit, Sit to Supine ?  ?  ?Supine to sit: Supervision, HOB elevated ?Sit to supine: Supervision, HOB elevated ?  ?General bed mobility comments: Supervision for safety, HOB elevated, extra time to lift legs onto bed to supine ?  ? ?Transfers ?Overall transfer level: Needs assistance ?Equipment used: Rolling walker (2 wheels) ?Transfers: Sit to/from Stand ?Sit to Stand: Min guard, +2 safety/equipment, From elevated surface ?  ?  ?  ?  ?  ?General transfer comment: Bed raised due to pt having long legs, min guard for safety, no LOB. ?  ? ?  ?Balance Overall balance assessment: Needs assistance ?Sitting-balance support: No upper extremity supported, Feet supported ?Sitting balance-[name]: Fair ?  ?  ?Standing balance support: Bilateral upper extremity supported, During functional activity, Reliant on assistive device for balance ?Standing balance-[name]: Poor ?Standing balance comment: Reliant on UE support ?  ?  ?  ?  ?  ?  ?  ?  ?  ?  ?  ?   ? ?ADL either performed or assessed with clinical judgement  ? ?ADL Overall ADL's : Needs assistance/impaired ?Eating/Feeding: Set up;Sitting ?  ?Grooming: Set up;Sitting ?  ?Upper Body Bathing: Minimal assistance;Sitting ?  ?Lower Body Bathing: Moderate assistance;Sit to/from stand ?  ?Upper Body Dressing : Minimal assistance;Sitting ?  ?Lower Body Dressing: Moderate assistance;Sit to/from stand ?  ?Toilet Transfer: Ambulation;Rolling walker (2 wheels);Min guard ?  ?  ?  ?  ?  ?Functional mobility during ADLs: Min guard;Rolling walker (2 wheels) ?   ? ? ? ?Vision   ?   ?   ?  Perception   ?  ?Praxis   ?  ? ?Pertinent Vitals/Pain Pain Assessment ?Pain Assessment: Faces ?Faces Pain Scale: Hurts a little bit ?Pain Location: generalized with mobility ?Pain Descriptors / Indicators: Grimacing ?Pain Intervention(s): Monitored  during session, Limited activity within patient's tolerance, Repositioned  ? ? ? ?Hand Dominance Right ?  ?Extremity/Trunk Assessment Upper Extremity Assessment ?Upper Extremity Assessment: Generalized weakness ?  ?Lower Extremity Assessment ?Lower Extremity Assessment: Defer to PT evaluation ?  ?Cervical / Trunk Assessment ?Cervical / Trunk Assessment: Kyphotic ?  ?Communication Communication ?Communication: HOH ?  ?Cognition Arousal/Alertness: Awake/alert ?Behavior During Therapy: [facility] for tasks assessed/performed ?Overall Cognitive Status: Within Functional Limits for tasks assessed ?  ?  ?  ?  ?  ?  ?  ?  ?  ?  ?  ?  ?  ?  ?  ?  ?General Comments: Follows commands appropriately and self-identifies his limits. ?  ?  ?General Comments  SpO2 down to low 80s% on 8-10L O2 while ambulating, needing <2 min and 15L O2 while sitting to recover SpO2 >/= 90% ? ?  ?Exercises   ?  ?Shoulder Instructions    ? ? ?Home Living Family/patient expects to be discharged to:: Private residence ?Living Arrangements: Spouse/significant other ?Available Help at Discharge: Family;Available 24 hours/day ?Type of Home: House ?Home Access: Stairs to enter ?Entrance Stairs-Number of Steps: 3-4 ?Entrance Stairs-Rails: Can reach both ?Home Layout: One level ?  ?  ?Bathroom Shower/Tub: Walk-in shower ?  ?Bathroom Toilet: Handicapped height ?  ?  ?Home Equipment: Conservation officer, nature (2 wheels);Cane - single point;Rollator (4 wheels);Shower seat;BSC/3in1;Grab bars - tub/shower ?  ?Additional Comments: Planning to get bathroom renovated to be w/c accessible ?  ? ?  ?Prior Functioning/Environment Prior Level of Function : Needs assist ?  ?  ?  ?  ?  ?  ?Mobility Comments: Does not go outside much. Normally just walks short distances in the home without AD ?ADLs Comments: Wife assists with pants, but otherwise pt can dress top half independently. ?  ? ?  ?  ?OT Problem List: Decreased strength;Decreased range of motion;Decreased activity  tolerance;Impaired balance (sitting and/or standing);Decreased knowledge of precautions;Decreased knowledge of use of DME or AE ?  ?   ?OT Treatment/Interventions: Self-care/ADL training;Therapeutic exercise;Energy conservation;DME and/or AE instruction  ?  ?OT Goals(Current goals can be found in the care plan section) Acute Rehab OT Goals ?Patient Stated Goal: Go home ?OT Goal Formulation: With patient/family ?Time For Goal Achievement: 12/06/21 ?Potential to Achieve Goals: Good  ?OT Frequency: Min 3X/week ?  ? ?Co-evaluation PT/OT/SLP Co-Evaluation/Treatment: Yes ?Reason for Co-Treatment: For patient/therapist safety;To address functional/ADL transfers ?  ?OT goals addressed during session: ADL's and self-care ?  ? ?  ?AM-PAC OT "6 Clicks" Daily Activity     ?Outcome Measure Help from another person eating meals?: A Little ?Help from another person taking care of personal grooming?: A Little ?Help from another person toileting, which includes using toliet, bedpan, or urinal?: A Little ?Help from another person bathing (including washing, rinsing, drying)?: A Lot ?Help from another person to put on and taking off regular upper body clothing?: A Little ?Help from another person to put on and taking off regular lower body clothing?: A Lot ?6 Click Score: 16 ?  ?End of Session Equipment Utilized During Treatment: Rolling walker (2 wheels);Oxygen ?Nurse Communication: Mobility status ? ?Activity Tolerance: Patient tolerated treatment well ?Patient left: in bed;with call bell/phone within reach;with family/visitor present ? ?OT Visit Diagnosis: Unsteadiness  on feet (R26.81);Other abnormalities of gait and mobility (R26.89);Muscle weakness (generalized) (M62.81)  ?              ?Time: 4320-0379 ?OT Time Calculation (min): 21 min ?Charges:  OT General Charges ?$OT Visit: 1 Visit ?OT Evaluation ?$OT Eval Moderate Complexity: 1 Mod ? ?Milika Ventress MSOT, OTR/L ?Acute Rehab ?Pager: [phone] ?Office:  [phone] ? ?Darlis Wragg M Levern Kalka ?11/22/2021, 5:06 PM ?

## 2021-11-22 NOTE — Progress Notes (Signed)
Pt with streptococcus gallolyticus bacteremia. It came back pan sensitive except for erythromycin. D/w Dr. Thereasa Solo and we will optimize ceftriaxone to ampicillin then amoxicillin when ready.  ? ?Ampicillin 2g IV q6 ? ?Onnie Boer, PharmD, BCIDP, AAHIVP, CPP ?Infectious Disease Pharmacist ?11/22/2021 2:52 PM ? ? ?

## 2021-11-22 NOTE — Progress Notes (Signed)
? Sean Riley  MGQ:[id number] DOB: 20-May-1951 DOA: 11/20/2021 ?PCP: Colon Branch, MD   ? ?Brief Narrative:  ?71yo with a history of atrial fibrillation, HLD, HTN, COPD on chronic 6 L home O2 support, and aortic dissection status post repair who presently receives home hospice assistance due to end stage COPD.  He presented to the ED with a week of upper respiratory symptoms which acutely got worse the night before with severe shortness of breath and chills and a temperature of 102 measured at home. His wife contacted the hospice nurse who reportedly recommended they present to the ER.  EMS was summoned and found his saturations to be 70% on room air.  He was placed on CPAP for transportation and then transitioned to BiPAP once in the ER.  CXR in the ED noted stable bilateral effusions and scattered infiltrates, not clearly different from his baseline x-rays. ? ?Consultants:  ?PCCM ?Palliative Care  ? ?Code Status: FULL CODE ? ?DVT prophylaxis: ?Lovenox  ? ?Interim Hx: ?Afebrile.  Blood pressure stable.  Saturations in mid 90s on 8 L salter high flow nasal cannula.  No acute events reported overnight.  Hemoglobin appears to be stable. Somewhat "spaced out" on meds, but more alert and conversant. Appears comfortable/not in acute distress.  ? ?Assessment & Plan: ? ?Severe COPD with acute bronchospastic exacerbation -acute on chronic hypoxic respiratory failure ?Tx as per PCCM - utilizing morphine for dypsnea - end stage disease  ? ?Possible multilobar pneumonia ?Comparison of CXR to baseline does not suggest acute change - narrow abx only to cover blood cultures and follow  ? ?2 of 2 blood cultures positive for Strep species ?Discussed w/ ID - given active life threatening issues I did not request a formal consult - will continue conservative care w/ ongoing abx tx - pt would clearly not be a candidate for SBE eval (TEE) or colonoscopy to rule out occult colon lesion as source  ? ?History of Enterococcus bacteremia  January 2023 ? 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 ?Chronic diastolic CHF exacerbation ?Monitor volume - does not appear grossly overloaded at this time  ? ?Chronic atrial fibrillation with acute RVR ?Heart rate presently controlled - hold full anticoag for now given GIB ? ?BRBPR ?Appears to have been self-limited -likely hemorrhoidal -consider resuming anticoagulation 4/16 if remains stable ? ?Anemia of acute blood loss ?Hemoglobin now appears to have stabilized ? ?Hyponatremia ?Likely simply volume related -sodium has normalized ? ?Hypokalemia ?Corrected with supplementation ? ? ?Family Communication: no family present at time of exam today  ?Disposition: From home  ? ?Objective: ?Blood pressure (!) 114/59, pulse 87, temperature 97.6 ?F (36.4 ?C), temperature source Axillary, resp. rate 20, height '6\' 1"'$  (1.854 m), weight 76.6 kg, SpO2 98 %. ? ?Intake/Output Summary (Last 24 hours) at 11/22/2021 0846 ?Last data filed at 11/22/2021 0636 ?Gross per 24 hour  ?Intake 1800.46 ml  ?Output 2550 ml  ?Net -749.54 ml  ? ? ?Filed Weights  ? 11/20/21 0438  ?Weight: 76.6 kg  ? ? ?Examination: ?General: somewhat confused - alert - not in extremis  ?Lungs: Poor air movement throughout all fields - no wheezing  ?Cardiovascular: Distant heart sounds, irregular ?Abdomen: Obese, soft, no rebound, no organomegaly ?Extremities: Trace edema B LE  ? ?CBC: ?Recent Labs  ?Lab 11/20/21 ?[phone]  11/20/21 ?1194 11/21/21 ?1740 11/21/21 ?1204 11/22/21 ?0037  ?WBC 18.5*  --  17.1* 14.6* 17.1*  ?NEUTROABS 16.9*  --   --   --   --   ?HGB 11.6*   < > 9.9* 10.0* 9.8*  ?HCT 36.6*   < > 31.0* 32.0* 31.3*  ?MCV 85.5  --  85.4 86.0 85.8  ?PLT 267  --  253 253 278  ? < > = values in this interval not displayed.  ? ? ?Basic Metabolic Panel: ?Recent Labs   ?Lab 11/20/21 ?0446 11/20/21 ?8144 11/21/21 ?8185 11/22/21 ?0037  ?NA 133* 136 131* 135  ?K 3.6 3.7 3.2* 4.5  ?CL 91*  --  94* 99  ?CO2 34*  --  30 29  ?GLUCOSE 212*  --  262* 164*  ?BUN 17  --  17 16  ?CREATININE 1.50*  --  1.21 0.97  ?CALCIUM 8.5*  --  8.3* 8.4*  ? ? ?GFR: ?Estimated Creatinine Clearance: 74.6 mL/min (by C-G formula based on SCr of 0.97 mg/dL). ? ?Liver Function Tests: ?Recent Labs  ?Lab 11/20/21 ?0446 11/22/21 ?0037  ?AST 20 22  ?ALT 14 20  ?ALKPHOS 77 65  ?BILITOT 1.3* 0.5  ?PROT 6.5 5.6*  ?ALBUMIN 3.1* 2.6*  ? ? ? ?Coagulation Profile: ?Recent Labs  ?Lab 11/20/21 ?0446 11/20/21 ?2055  ?INR 1.5* 1.4*  ? ? ? ?Scheduled Meds: ? ALPRAZolam  1.5 mg Oral QHS  ? arformoterol  15 mcg Nebulization BID  ? budesonide  0.5 mg Nebulization Daily  ? docusate sodium  100 mg Oral BID  ? enoxaparin (LOVENOX) injection  40 mg Subcutaneous Q24H  ? escitalopram  10 mg Oral Daily  ? feeding supplement  237 mL Oral BID BM  ? flecainide  75 mg Oral BID  ? metoprolol tartrate  12.5 mg Oral BID  ? multivitamin with minerals  1 tablet Oral Daily  ? pantoprazole  40 mg Oral Daily  ? predniSONE  40 mg Oral BID WC  ? revefenacin  175 mcg Nebulization Daily  ? rosuvastatin  5 mg Oral QHS  ? sodium chloride flush  3 mL Intravenous Q12H  ? ?Continuous Infusions: ? sodium chloride 10 mL/hr at 11/21/21 1051  ? azithromycin 500 mg (11/22/21 0352)  ? cefTRIAXone (ROCEPHIN)  IV 2 g (11/22/21 6314)  ? ? ? LOS: 2 days  ? ?Cherene Altes, MD ?[facility] ?Office  [phone] ?Pager - Text Page per Shea Evans ? ?If 7PM-7AM, please contact night-coverage per Amion ?11/22/2021, 8:46 AM ? ? ? ?

## 2021-11-22 NOTE — Progress Notes (Signed)
? ?NAME:  Sean Riley, MRN:  [id number], DOB:  [birth year], LOS: 2 ?ADMISSION DATE:  11/20/2021, CONSULTATION DATE:  4/13 ?REFERRING MD:  Sean Riley, CHIEF COMPLAINT:  SOB  ? ?History of Present Illness:  ?Sean Riley, is a 71 y.o. male, who presented to the [facility] ED with a chief complaint of  SOB ? ?They have a pertinent past medical history of COPD on 5 to 6 L home O2, bilateral pleural effusions, CHF chronic respiratory failure, atrial fibrillation on Eliquis, AAA s/p endovascular repair in 2019, AVR with bioprosthetic valve, HLD, HTN, GIB ? ?Recent admission 2/17 - 10/03/2021 for COPD exacerbation.  Recently being seen by [place] palliative care.  Patient states that they have not been by yet.  EMS called the evening of 4/12 for shortness of breath, reports of home oxygen sat being 70% on room air.  Unclear if patient was wearing oxygen or not. ? ?Chest x-ray in the ED concerning for multilobar bilateral pneumonia greater in the left than right, moderate bilateral pleural effusions, imaging similar to previous admission.  WBC 18.5, lactate 1.4. ABG 7.4 4/50/70/34.  Patient started on ceftriaxone and azithromycin.  Admitted to the hospitalist service. ? ?Patient work of breathing increased while in the emergency department was briefly placed on BiPAP.  On exam by PCCM was back on 6 L O2 per nasal cannula (baseline),  ? ?PCCM was consulted for assistance with management ? ? ?Pertinent  Medical History  ?COPD on 5 to 6 L home O2, bilateral pleural effusions, CHF chronic respiratory failure, atrial fibrillation on Eliquis, AAA s/p endovascular repair in 2019, AVR with bioprosthetic valve, HLD, HTN, GIB ? ?Significant Hospital Events: ?Including procedures, antibiotic start and stop dates in addition to other pertinent events   ?4/13 presented to [facility] ED w/ COPD exacerbation ? ?Interim History / Subjective:  ?Feeling better today, no CP.  ? ?Objective   ?Blood pressure 128/61, pulse 81, temperature 97.6 ?F (36.4 ?C),  temperature source Axillary, resp. rate 20, height '6\' 1"'$  (1.854 m), weight 76.6 kg, SpO2 98 %. ?   ?FiO2 (%):  [96 %] 96 %  ? ?Intake/Output Summary (Last 24 hours) at 11/22/2021 1117 ?Last data filed at 11/22/2021 1107 ?Gross per 24 hour  ?Intake 1800.46 ml  ?Output 3350 ml  ?Net -1549.54 ml  ? ? ?Filed Weights  ? 11/20/21 0438  ?Weight: 76.6 kg  ? ? ?Examination: ?General: chronically ill appearing man  ?HEENT: [place]/AT, eyes anicteric ?Neuro: awake, answering questions appropriately, hard of hearing, moving all extremities ?CV:  S1S2, RRR ?PULM:  breathing comfortably on [place], no wheezing or rhonchi, reduced basilar breath sounds. No conversational dyspnea. ?GI: soft, NT ?Extremities: minimal ankle edema ?Skin: warm, dry ? ? ?Resolved Hospital Problem list   ? ? ?Assessment & Plan:  ?Acute COPD exacerbation ?Acute on chronic respiratory failure, secondary to aspiration pneumonia & COPD exacerbation, on 6L O2 at baseline. Baseline severe COPD. ?Acute on chronic HFpEF, likely a component of acute pulmonary edema ?Chronic pleural effusions, partially loculated,  secondary to CHF ?-redose with lasix today ?-con't supplemental O2 to maintain SpO2 88-92%; has high baseline requirements ?-BiPAP PRN for comfort ?-complete 7 days of empiric antibiotics ?-steroids ?-bronchodilators-- already on home nebulized yupelri, brovana ?-con't morphine and xanax for anxiety, air hunger ?-flutter, IS ? ?Strep gallolyticus bacteremia, suspect he has aspiration pneumonia ?-2 weeks of antibiotics; agree that invasive evaluation into cause or complications of this is not appropriate with his severe respiratory failure. There would be no way  to perform invasive procedures on him without mechanical ventilation. ? ?Goals of care ?-Follows with [facility] ?-con't morphine and xanax ?-[place] form signed today, placed on chart ? ?Rest of care per primary. ? ?Best Practice (right click and "Reselect all SmartList Selections" daily)   ? ?Per primary ?Last date of multidisciplinary goals of care discussion [ 4/14] ? ?Labs   ?CBC: ?Recent Labs  ?Lab 11/20/21 ?0446 11/20/21 ?3716 11/20/21 ?2055 11/21/21 ?9678 11/21/21 ?1204 11/22/21 ?0037  ?WBC 18.5*  --   --  17.1* 14.6* 17.1*  ?NEUTROABS 16.9*  --   --   --   --   --   ?HGB 11.6* 11.6* 10.9* 9.9* 10.0* 9.8*  ?HCT 36.6* 34.0* 34.1* 31.0* 32.0* 31.3*  ?MCV 85.5  --   --  85.4 86.0 85.8  ?PLT 267  --   --  253 253 278  ? ? ? ?Basic Metabolic Panel: ?Recent Labs  ?Lab 11/20/21 ?0446 11/20/21 ?9381 11/21/21 ?0175 11/22/21 ?0037  ?NA 133* 136 131* 135  ?K 3.6 3.7 3.2* 4.5  ?CL 91*  --  94* 99  ?CO2 34*  --  30 29  ?GLUCOSE 212*  --  262* 164*  ?BUN 17  --  17 16  ?CREATININE 1.50*  --  1.21 0.97  ?CALCIUM 8.5*  --  8.3* 8.4*  ? ? ?GFR: ?Estimated Creatinine Clearance: 74.6 mL/min (by C-G formula based on SCr of 0.97 mg/dL). ? ?Sean Hy, DO 11/22/21 1:04 PM ?[facility] ? ?

## 2021-11-23 ENCOUNTER — Inpatient Hospital Stay (HOSPITAL_COMMUNITY): Payer: Medicare Other

## 2021-11-23 DIAGNOSIS — J189 Pneumonia, unspecified organism: Secondary | ICD-10-CM | POA: Diagnosis not present

## 2021-11-23 DIAGNOSIS — A419 Sepsis, unspecified organism: Secondary | ICD-10-CM | POA: Diagnosis not present

## 2021-11-23 LAB — BASIC METABOLIC PANEL
Anion gap: 4 — ABNORMAL LOW (ref 5–15)
BUN: 18 mg/dL (ref 8–23)
CO2: 37 mmol/L — ABNORMAL HIGH (ref 22–32)
Calcium: 8.5 mg/dL — ABNORMAL LOW (ref 8.9–10.3)
Chloride: 93 mmol/L — ABNORMAL LOW (ref 98–111)
Creatinine, Ser: 1.04 mg/dL (ref 0.61–1.24)
GFR, Estimated: >60 mL/min (ref >60)
Glucose, Bld: 158 mg/dL — ABNORMAL HIGH (ref 70–99)
Potassium: 5 mmol/L (ref 3.5–5.1)
Sodium: 134 mmol/L — ABNORMAL LOW (ref 135–145)

## 2021-11-23 LAB — HEMOGLOBIN A1C
Hgb A1c MFr Bld: 5.6 % (ref 4.8–5.6)
Mean Plasma Glucose: 114.02 mg/dL

## 2021-11-23 LAB — MAGNESIUM: Magnesium: 2.5 mg/dL — ABNORMAL HIGH (ref 1.7–2.4)

## 2021-11-23 MED ORDER — FUROSEMIDE 10 MG/ML IJ SOLN
40.0000 mg | Freq: Once | INTRAMUSCULAR | Status: AC
  Administered 2021-11-23: 40 mg via INTRAVENOUS
  Filled 2021-11-23: qty 4

## 2021-11-23 MED ORDER — APIXABAN 5 MG PO TABS
5.0000 mg | ORAL_TABLET | Freq: Two times a day (BID) | ORAL | Status: DC
  Administered 2021-11-23 – 2021-11-24 (×3): 5 mg via ORAL
  Filled 2021-11-23 (×3): qty 1

## 2021-11-23 NOTE — Progress Notes (Signed)
No response from IP with regards to previous note/Secure page. This RN reached out a second time to Infection Prevention via secure page for input on route of transmission. Pt family concerned for Pt's spouse. Awaiting callback and/or note from IP.  ?

## 2021-11-23 NOTE — Plan of Care (Signed)
?  Problem: Education: ?Goal: Knowledge of disease or condition will improve ?Outcome: Progressing ?Goal: Knowledge of the prescribed therapeutic regimen will improve ?Outcome: Progressing ?  ?Problem: Activity: ?Goal: Ability to tolerate increased activity will improve ?Outcome: Progressing ?  ?Problem: Respiratory: ?Goal: Ability to maintain a clear airway will improve ?Outcome: Progressing ?Goal: Levels of oxygenation will improve ?Outcome: Progressing ?Goal: Ability to maintain adequate ventilation will improve ?Outcome: Progressing ?  ?Problem: Activity: ?Goal: Ability to tolerate increased activity will improve ?Outcome: Progressing ?  ?Problem: Clinical Measurements: ?Goal: Ability to maintain a body temperature in the normal range will improve ?Outcome: Progressing ?  ?Problem: Respiratory: ?Goal: Ability to maintain adequate ventilation will improve ?Outcome: Progressing ?Goal: Ability to maintain a clear airway will improve ?Outcome: Progressing ?  ?Problem: Education: ?Goal: Knowledge of General Education information will improve ?Description: Including pain rating scale, medication(s)/side effects and non-pharmacologic comfort measures ?Outcome: Progressing ?  ?Problem: Health Behavior/Discharge Planning: ?Goal: Ability to manage health-related needs will improve ?Outcome: Progressing ?  ?Problem: Clinical Measurements: ?Goal: Ability to maintain clinical measurements within normal limits will improve ?Outcome: Progressing ?Goal: Diagnostic test results will improve ?Outcome: Progressing ?Goal: Respiratory complications will improve ?Outcome: Progressing ?Goal: Cardiovascular complication will be avoided ?Outcome: Progressing ?  ?Problem: Activity: ?Goal: Risk for activity intolerance will decrease ?Outcome: Progressing ?  ?Problem: Nutrition: ?Goal: Adequate nutrition will be maintained ?Outcome: Progressing ?  ?Problem: Elimination: ?Goal: Will not experience complications related to urinary  retention ?Outcome: Progressing ?  ?Problem: Pain Managment: ?Goal: General experience of comfort will improve ?Outcome: Progressing ?  ?Problem: Safety: ?Goal: Ability to remain free from injury will improve ?Outcome: Progressing ?  ?Problem: Skin Integrity: ?Goal: Risk for impaired skin integrity will decrease ?Outcome: Progressing ?  ?Problem: Activity: ?Goal: Will verbalize the importance of balancing activity with adequate rest periods ?Outcome: Not Progressing ?  ?Problem: Coping: ?Goal: Level of anxiety will decrease ?Outcome: Not Progressing ?  ?Problem: Elimination: ?Goal: Will not experience complications related to bowel motility ?Outcome: Not Progressing ?  ?

## 2021-11-23 NOTE — Progress Notes (Signed)
Pt is refusing BIPAP for tonight. He told me last night to place on when he was asleep after midnight or 1am, which I did but tonight, he does "not want me to try at all while he is asleep". ?  ?

## 2021-11-23 NOTE — Progress Notes (Signed)
? Sean Riley  BHA:[id number] DOB: [birth year] DOA: 11/20/2021 ?PCP: Colon Branch, MD   ? ?Brief Narrative:  ?71yo with a history of atrial fibrillation, HLD, HTN, COPD on chronic 6 L home O2 support, and aortic dissection status post repair who presently receives home hospice assistance due to end stage COPD.  He presented to the ED with a week of upper respiratory symptoms which acutely got worse the night before with severe shortness of breath and chills and a temperature of 102 measured at home. His wife contacted the hospice nurse who reportedly recommended they present to the ER.  EMS was summoned and found his saturations to be 70% on room air.  He was placed on CPAP for transportation and then transitioned to BiPAP once in the ER.  CXR in the ED noted stable bilateral effusions and scattered infiltrates, not clearly different from his baseline x-rays. ? ?Consultants:  ?PCCM ?Palliative Care  ? ?Code Status: FULL CODE ? ?DVT prophylaxis: ?Eliquis ? ?Interim Hx: ?Afebrile.  Requiring 10 L high flow nasal cannula support via salter to keep saturations in mid 90s.  Vitals otherwise stable.  No acute events reported overnight.  Net negative approximately 1800 cc since admission.  Alert and conversant today.  Appears much more comfortable.  No new complaints. ? ?Assessment & Plan: ? ?Severe COPD with acute bronchospastic exacerbation -acute on chronic hypoxic respiratory failure ?Tx as per PCCM - utilizing morphine for dypsnea - end stage disease -appears more comfortable at this time ? ?Possible multilobar pneumonia ?Comparison of CXR to baseline does not suggest acute change - narrow abx only to cover blood cultures and follow  ? ?2 of 2 blood cultures positive for Strep gallolyticus ?Discussed w/ ID - given active life threatening issues I did not request a formal consult - will continue conservative care w/ ongoing abx tx - pt would clearly not be a candidate for SBE eval (TEE) or colonoscopy to rule out  occult colon lesion as source - TTE today to rule out severe/large vegetation  ? ?History of Enterococcus bacteremia January 2023 ? 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 ?Chronic diastolic CHF exacerbation ?Monitor volume -intermittent diuretic being dosed as tolerated -maintaining net negative balance thus far ? ?Chronic atrial fibrillation with acute RVR ?Heart rate controlled -resumed Eliquis ? ?BRBPR ?Appears to have been self-limited -likely hemorrhoidal -resumed Eliquis today - monitor for recurrence ? ?Anemia of acute blood loss ?Hemoglobin holding steady at 9.5-10 ? ?Hyponatremia ?Likely simply volume related -monitor with intermittent diuresis ? ?Hypokalemia ?Corrected with supplementation ? ? ?Family Communication: Spoke with family at bedside at length ?Disposition: From home -anticipate return home with focus on quality of life -probable transition to oral antibiotics for empiric treatment after discharge -need results of TTE and will check repeat blood cultures but otherwise if patient remains stable hope for discharge home within 48 hours ? ?Objective: ?Blood pressure 128/70, pulse 63, temperature 97.7 ?F (36.5 ?C), temperature source Oral, resp. rate 19, height '6\' 1"'$  (1.854 m), weight 76.6 kg, SpO2 96 %. ? ?Intake/Output Summary (Last 24 hours) at 11/23/2021 0911 ?Last data filed at 11/23/2021 0330 ?Gross per 24 hour  ?Intake 680 ml  ?Output 2550 ml  ?Net -1870 ml  ? ? ?Filed Weights  ? 11/20/21 0438  ?Weight: 76.6 kg  ? ? ?Examination: ?General: More alert today -respirations appear more  comfortable ?Lungs: Poor air movement throughout all fields - fine crackles diffusely ?Cardiovascular: Distant heart sounds, irregular -no rub ?Abdomen: Obese, soft, no rebound, no organomegaly ?Extremities: Trace edema B lower extremities ? ?CBC: ?Recent Labs  ?Lab  11/20/21 ?0446 11/20/21 ?2458 11/21/21 ?0998 11/21/21 ?1204 11/22/21 ?0037  ?WBC 18.5*  --  17.1* 14.6* 17.1*  ?NEUTROABS 16.9*  --   --   --   --   ?HGB 11.6*   < > 9.9* 10.0* 9.8*  ?HCT 36.6*   < > 31.0* 32.0* 31.3*  ?MCV 85.5  --  85.4 86.0 85.8  ?PLT 267  --  253 253 278  ? < > = values in this interval not displayed.  ? ? ?Basic Metabolic Panel: ?Recent Labs  ?Lab 11/21/21 ?3382 11/22/21 ?5053 11/23/21 ?9767  ?NA 131* 135 134*  ?K 3.2* 4.5 5.0  ?CL 94* 99 93*  ?CO2 30 29 37*  ?GLUCOSE 262* 164* 158*  ?BUN '17 16 18  '$ ?CREATININE 1.21 0.97 1.04  ?CALCIUM 8.3* 8.4* 8.5*  ?MG  --   --  2.5*  ? ? ?GFR: ?Estimated Creatinine Clearance: 69.6 mL/min (by C-G formula based on SCr of 1.04 mg/dL). ? ?Liver Function Tests: ?Recent Labs  ?Lab 11/20/21 ?0446 11/22/21 ?0037  ?AST 20 22  ?ALT 14 20  ?ALKPHOS 77 65  ?BILITOT 1.3* 0.5  ?PROT 6.5 5.6*  ?ALBUMIN 3.1* 2.6*  ? ? ? ?Coagulation Profile: ?Recent Labs  ?Lab 11/20/21 ?0446 11/20/21 ?2055  ?INR 1.5* 1.4*  ? ? ? ?Scheduled Meds: ? ALPRAZolam  1.5 mg Oral QHS  ? arformoterol  15 mcg Nebulization BID  ? budesonide  0.5 mg Nebulization Daily  ? docusate sodium  100 mg Oral BID  ? enoxaparin (LOVENOX) injection  40 mg Subcutaneous Q24H  ? escitalopram  10 mg Oral Daily  ? feeding supplement  237 mL Oral BID BM  ? flecainide  75 mg Oral BID  ? metoprolol tartrate  12.5 mg Oral BID  ? multivitamin with minerals  1 tablet Oral Daily  ? pantoprazole  40 mg Oral Daily  ? predniSONE  40 mg Oral BID WC  ? revefenacin  175 mcg Nebulization Daily  ? rosuvastatin  5 mg Oral QHS  ? sodium chloride flush  3 mL Intravenous Q12H  ? ?Continuous Infusions: ? ampicillin (OMNIPEN) IV Stopped (11/23/21 0734)  ? ? ? LOS: 3 days  ? ?Cherene Altes, MD ?[facility] ?Office  [phone] ?Pager - Text Page per Shea Evans ? ?If 7PM-7AM, please contact night-coverage per Amion ?11/23/2021, 9:11 AM ? ? ? ?

## 2021-11-23 NOTE — Progress Notes (Addendum)
Pt daughter called. My chart results and Pharm note about strep gallolyticus. This RN reached out to Infection Prevention via secure page for input on route of transmission. Pt family concerned for Pt's spouse. Awaiting callback and/or note from IP.  ? ?UPDATE 2030 - Pt's brother alerted that not callback received from IP and will resume data collection in am. This RN asked that he communicate that with his sister.  ?

## 2021-11-24 ENCOUNTER — Inpatient Hospital Stay (HOSPITAL_COMMUNITY): Payer: Medicare Other

## 2021-11-24 DIAGNOSIS — I38 Endocarditis, valve unspecified: Secondary | ICD-10-CM | POA: Diagnosis not present

## 2021-11-24 DIAGNOSIS — J189 Pneumonia, unspecified organism: Secondary | ICD-10-CM | POA: Diagnosis not present

## 2021-11-24 DIAGNOSIS — A419 Sepsis, unspecified organism: Secondary | ICD-10-CM | POA: Diagnosis not present

## 2021-11-24 LAB — BASIC METABOLIC PANEL
Anion gap: 9 (ref 5–15)
BUN: 16 mg/dL (ref 8–23)
CO2: 35 mmol/L — ABNORMAL HIGH (ref 22–32)
Calcium: 8.9 mg/dL (ref 8.9–10.3)
Chloride: 92 mmol/L — ABNORMAL LOW (ref 98–111)
Creatinine, Ser: 0.9 mg/dL (ref 0.61–1.24)
GFR, Estimated: >60 mL/min (ref >60)
Glucose, Bld: 170 mg/dL — ABNORMAL HIGH (ref 70–99)
Potassium: 4.2 mmol/L (ref 3.5–5.1)
Sodium: 136 mmol/L (ref 135–145)

## 2021-11-24 LAB — ECHOCARDIOGRAM COMPLETE
AR max vel: 1.1 cm2
AV Area VTI: 1.09 cm2
AV Area mean vel: 1.05 cm2
AV Mean grad: 13 mmHg
AV Peak grad: 24.9 mmHg
Ao pk vel: 2.5 m/s
Area-P 1/2: 4.31 cm2
Height: 73 in
S' Lateral: 3.8 cm
Weight: 2701.96 oz

## 2021-11-24 LAB — BPAM RBC
Blood Product Expiration Date: 202304242359
Blood Product Expiration Date: 202304252359
Unit Type and Rh: 9500
Unit Type and Rh: 9500

## 2021-11-24 LAB — TYPE AND SCREEN
ABO/RH(D): O NEG
Antibody Screen: POSITIVE
Donor AG Type: NEGATIVE
Donor AG Type: NEGATIVE
Unit division: 0
Unit division: 0

## 2021-11-24 MED ORDER — AMOXICILLIN 500 MG PO CAPS: 1000.0000 mg | ORAL_CAPSULE | Freq: Three times a day (TID) | ORAL | 1 refills | Status: AC

## 2021-11-24 MED ORDER — ALPRAZOLAM 0.25 MG PO TABS
0.2500 mg | ORAL_TABLET | Freq: Three times a day (TID) | ORAL | Status: DC | PRN
  Administered 2021-11-24: 0.5 mg via ORAL
  Filled 2021-11-24: qty 2

## 2021-11-24 MED ORDER — MORPHINE SULFATE (CONCENTRATE) 10 MG/0.5ML PO SOLN: 2.5000 mg | ORAL | 0 refills | Status: DC | PRN

## 2021-11-24 MED ORDER — ALPRAZOLAM 0.25 MG PO TABS: 0.2500 mg | ORAL_TABLET | Freq: Three times a day (TID) | ORAL | 0 refills | Status: AC | PRN

## 2021-11-24 MED ORDER — ALPRAZOLAM 0.25 MG PO TABS: 0.2500 mg | ORAL_TABLET | Freq: Three times a day (TID) | ORAL | 0 refills | Status: DC | PRN

## 2021-11-24 MED ORDER — SODIUM CHLORIDE 0.9 % IV SOLN
2.0000 g | INTRAVENOUS | Status: DC
  Administered 2021-11-24: 2 g via INTRAVENOUS
  Filled 2021-11-24 (×3): qty 2000

## 2021-11-24 MED ORDER — AMOXICILLIN 500 MG PO CAPS: 1000.0000 mg | ORAL_CAPSULE | Freq: Three times a day (TID) | ORAL | 1 refills | Status: DC

## 2021-11-24 MED ORDER — METOPROLOL TARTRATE 5 MG/5ML IV SOLN: 5.0000 mg | INTRAVENOUS | Status: DC | PRN

## 2021-11-24 MED ORDER — PREDNISONE 20 MG PO TABS
40.0000 mg | ORAL_TABLET | Freq: Every day | ORAL | 1 refills | Status: AC
Start: 2021-11-25 — End: ?

## 2021-11-24 MED ORDER — MORPHINE SULFATE (CONCENTRATE) 10 MG/0.5ML PO SOLN: 2.5000 mg | ORAL | Status: DC | PRN

## 2021-11-24 MED ORDER — AMOXICILLIN 500 MG PO CAPS
1000.0000 mg | ORAL_CAPSULE | Freq: Three times a day (TID) | ORAL | Status: DC
  Filled 2021-11-24 (×2): qty 2

## 2021-11-24 MED ORDER — PREDNISONE 20 MG PO TABS: 40.0000 mg | ORAL_TABLET | Freq: Every day | ORAL | Status: DC

## 2021-11-24 MED ORDER — MORPHINE SULFATE (CONCENTRATE) 10 MG/0.5ML PO SOLN: 2.5000 mg | ORAL | 0 refills | Status: AC | PRN

## 2021-11-24 MED ORDER — PREDNISONE 20 MG PO TABS: 40.0000 mg | ORAL_TABLET | Freq: Every day | ORAL | 1 refills | Status: DC

## 2021-11-24 MED ORDER — PREDNISONE 20 MG PO TABS: 20.0000 mg | ORAL_TABLET | Freq: Every day | ORAL | Status: DC

## 2021-11-24 NOTE — Progress Notes (Signed)
Cardiac monitoring d/ced as ordered.  

## 2021-11-24 NOTE — Progress Notes (Signed)
?   11/24/21 0749  ?Assess: MEWS Score  ?BP 119/85  ?Pulse Rate (!) 150 ?(10 am dose lopressor and tambocoor given. md aware.)  ?ECG Heart Rate (!) 123  ?Resp (!) 28  ?SpO2 95 %  ?O2 Device HFNC  ?O2 Flow Rate (L/min) 8 L/min  ?Assess: MEWS Score  ?MEWS Temp 0  ?MEWS Systolic 0  ?MEWS Pulse 2  ?MEWS RR 2  ?MEWS LOC 0  ?MEWS Score 4  ?MEWS Score Color Red  ?Assess: if the MEWS score is Yellow or Red  ?Were vital signs taken at a resting state? Yes  ?Focused Assessment No change from prior assessment  ?Early Detection of Sepsis Score *See Row Information* Low  ?MEWS guidelines implemented *See Row Information* Yes  ?Treat  ?Pain Scale 0-10  ?Pain Score 0  ?Escalate  ?MEWS: Escalate Yellow: discuss with charge nurse/RN and consider discussing with provider and RRT  ?Notify: Charge Nurse/RN  ?Name of Charge Nurse/RN Notified laura  ?Date Charge Nurse/RN Notified 11/24/21  ?Time Charge Nurse/RN Notified [phone]  ?Notify: Provider  ?Provider Name/Title dr. Billee Cashing clung  ?Date Provider Notified 11/24/21  ?Time Provider Notified [phone]  ?Notification Type Page  ?Notification Reason  ?(a-fib rvr-160 heart rate)  ?Provider response No new orders;Evaluate remotely  ?Date of Provider Response 11/24/21  ?Time of Provider Response 0800  ?Document  ?Patient Outcome Not stable and remains on department  ?Progress note created (see row info) Yes  ? ? ?

## 2021-11-24 NOTE — TOC Progression Note (Addendum)
Transition of Care (TOC) - Progression Note  ? ? ?Patient Details  ?Name: Sean Riley ?MRN: [id number] ?Date of Birth: June 20, 1951 ? ?Transition of Care (TOC) CM/SW Contact  ?Angelita Ingles, RN ?Phone Number:[phone] ? ?11/24/2021, 10:35 AM ? ?Clinical Narrative:    ?TOC consulted for patient with Home Bipap. CM has reached out to [facility] [phone]. Per [facility] they do not handle arranging home Bipap. CM called Home Bipap referral has been called to [facility]. CM will follow ? ?1230 CM has followed up with [place]. Agency has been made aware that Bipap has been set up with [facility]. Hospice states they they have spoken with daughter and agency are ready to resume hospice services as soon as patient is discharged. CM spoke with daughter [name] about recommendations for [facility] PT/OT. Per [name] the family and patient are not wanting any type of Home health therapies. No home health referrals at this time.  ? ? ?Expected Discharge Plan: [place] ?Barriers to Discharge: Continued Medical Work up ? ?Expected Discharge Plan and Services ?Expected Discharge Plan: [place] ?In-house Referral: NA ?Discharge Planning Services: CM Consult ?Post Acute Care Choice: NA ?Living arrangements for the past 2 months: [place] ?                ?  ?  ?  ?  ?  ?  ?  ?  ?  ?  ? ? ?Social Determinants of Health (SDOH) Interventions ?  ? ?Readmission Risk Interventions ? ?  11/24/2021  ? 10:22 AM  ?Readmission Risk Prevention Plan  ?Transportation Screening Complete  ?PCP or Specialist Appt within 5-7 Days Complete  ?Home Care Screening Complete  ?Medication Review (RN CM) Referral to Pharmacy  ? ? ?

## 2021-11-24 NOTE — Progress Notes (Signed)
? ?  [name] is a 71 y/o male with Acute on Chronic respiratory hypercarbia Failure and End Stage COPD . Patient presented to ED with complaints of sob and respiratory failure. Upon admission (11/20/2021), patient was placed on Bipap ST/Avaps and failed due to hypercapnia with a PCO2=50.3. Due to patient's condition, he is at risk of worsening of Respiratory failure and End Stage COPD. Patient has had numerous readmissions in the last 2 months with issues involving respiratory failure. Bipap ST/Avaps has been tried and failed on IPAP 14 and EPAP 8 Backup rate 14 however; patient will require volume ventilation to promote gas exchange via NIV therapy. Patient also requires mouthpiece ventilation for daytime as the pco2 elevates during the day. Due to the severity of the patient and the need for daytime mouthpiece ventilation, back up battery, alarms and portability an NIV is required which is not supported by the bipap/rad/bipap avaps device."  ? ?Cherene Altes, MD ?[facility] ?Office  [phone] ? ?11/24/2021, 11:36 AM ? ?

## 2021-11-24 NOTE — Progress Notes (Signed)
? ?  NAME:  Sean Riley, MRN:  [id number], DOB:  1951-05-26, LOS: 4 ?ADMISSION DATE:  11/20/2021, CONSULTATION DATE:  4/13 ?REFERRING MD:  Lorin Mercy, CHIEF COMPLAINT:  SOB  ? ?History of Present Illness:  ?Sean Riley, is a 71 y.o. male, who presented to the [facility] ED with a chief complaint of  SOB ? ?They have a pertinent past medical history of COPD on 5 to 6 L home O2, bilateral pleural effusions, CHF chronic respiratory failure, atrial fibrillation on Eliquis, AAA s/p endovascular repair in 2019, AVR with bioprosthetic valve, HLD, HTN, GIB ? ?Recent admission 2/17 - 10/03/2021 for COPD exacerbation.  Recently being seen by [facility] palliative care.  Patient states that they have not been by yet.  EMS called the evening of 4/12 for shortness of breath, reports of home oxygen sat being 70% on room air.  Unclear if patient was wearing oxygen or not. ? ?Chest x-ray in the ED concerning for multilobar bilateral pneumonia greater in the left than right, moderate bilateral pleural effusions, imaging similar to previous admission.  WBC 18.5, lactate 1.4. ABG 7.4 4/50/70/34.  Patient started on ceftriaxone and azithromycin.  Admitted to the hospitalist service. ? ?Patient work of breathing increased while in the emergency department was briefly placed on BiPAP.  On exam by PCCM was back on 6 L O2 per nasal cannula (baseline),  ? ?PCCM was consulted for assistance with management ? ? ?Pertinent  Medical History  ?COPD on 5 to 6 L home O2, bilateral pleural effusions, CHF chronic respiratory failure, atrial fibrillation on Eliquis, AAA s/p endovascular repair in 2019, AVR with bioprosthetic valve, HLD, HTN, GIB ? ?Significant Hospital Events: ?Including procedures, antibiotic start and stop dates in addition to other pertinent events   ?4/13 presented to [facility] ED w/ COPD exacerbation ? ?Interim History / Subjective:  ?No events. ?Wants to know when he can go home. ?Breathing is stably bad.   Slow progression over past several  months noted. ? ?Objective   ?Blood pressure 119/85, pulse (!) 150, temperature 97.8 ?F (36.6 ?C), temperature source Oral, resp. rate (!) 28, height '6\' 1"'$  (1.854 m), weight 76.6 kg, SpO2 95 %. ?   ?   ? ?Intake/Output Summary (Last 24 hours) at 11/24/2021 0851 ?Last data filed at 11/24/2021 0700 ?Gross per 24 hour  ?Intake 1023 ml  ?Output 2600 ml  ?Net -1577 ml  ? ? ?Filed Weights  ? 11/20/21 0438  ?Weight: 76.6 kg  ? ? ?Examination: ?Frail elderly man in NAD ?Breath sounds are diminished with transmitted upper airway sounds ?Heart sounds irregular, tachy ?Ext warm ? ?BMP looks good ?Echo pending ? ?Resolved Hospital Problem list   ? ? ?Assessment & Plan:  ?End stage COPD, progressive ?Chronic effusions, mild volume overload improved ?Bacteremia, recurrent- getting prolonged PO course of abx, not candidate for TEE, TTE pending ?Afib on AC ?- Seems to do well with qHS BIPAP, will see if his home hospice agency can provide one, TOC consult placed to help facilitate, appreciate help ?- Continue current symptom-based therapy including opiates, benzos, nebs ?- Would give another 3 days of prednisone (ordered) ?- Would send home on lasix '40mg'$  PO daily PRN leg swelling or weight gain ?- Will let Dr. Vaughan Browner know of hospitalization and BIPAP plan ?- Prolonged PO abx per primary ?- Available PRN ? ?Erskine Emery MD PCCM ?

## 2021-11-24 NOTE — Progress Notes (Signed)
Pt now states he will wear BIPAP but "wants to be good and asleep before I place it on". Will check back later. ?

## 2021-11-24 NOTE — Discharge Summary (Signed)
? ?DISCHARGE SUMMARY ? ?Sean Riley ? ?MR#: [id number] ? ?DOB:07-15-1951  ?Date of Admission: 11/20/2021 ?Date of Discharge: 11/24/2021 ? ?Attending Physician:Reality Dejonge Hennie Duos, MD ? ?Patient's PCP:Paz, Alda Berthold, MD ? ?Consults: ?PCCM ? ?Disposition: D/C home  ? ?Follow-up Appts: ?as needed only  ? ?Tests Needing Follow-up: ?focus after discharge is to be on quality of life and comfort - pt advised that he should feel free to discontinue any medication or intervention that he does not feel is contributing to his comfort - he is advised to utilize steroid, morphine, and xanax to control his dyspnea - every effort should be made to keep the patient at home and avoid further hospitalization, which will not benefit the patient in any meaningful way ? 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 ?Initial presentation: ?71yo with a history of atrial fibrillation, HLD, HTN, COPD on chronic 6 L home O2 support, and aortic dissection status post repair who presently receives home hospice assistance due to end stage COPD.  He presented to the ED with a week of upper respiratory symptoms which acutely got worse the night before with severe shortness of breath and chills and a temperature of 102 measured at home. His wife contacted the hospice nurse who reportedly recommended they present to the ER.  EMS was summoned and found his saturations to be 70% on room air.  He was placed on CPAP for transportation and then transitioned to BiPAP once in the ER.  CXR in the ED noted stable bilateral effusions  and scattered infiltrates, not clearly different from his baseline x-rays. ?  ? ?Hospital Course: ? ?Severe COPD with acute bronchospastic exacerbation -acute on chronic hypoxic respiratory failure ?Tx as per PCCM - utilizing morphine for dypsnea - end stage disease -appears more comfortable at time of d/c on morphine, xanax, steroid, and O2 titrated to comfort - counseled on strategies for comfort management at home  ?  ?Possible multilobar pneumonia - ruled out  ?Comparison of CXR to baseline does not suggest acute change - narrow abx only to cover blood cultures and follow  ?  ?Strep gallolyticus bacteremia  ?Discussed w/ ID - given active life threatening issues I did not request a formal consult - continue conservative care w/ ongoing abx tx - pt would clearly not be a candidate for SBE eval (TEE) or colonoscopy to rule out occult colon lesion as source - discussed w/ family - TTE was without severe/large vegetation evident - prescribed prolonged course of oral abx to cover for possible endocarditis, but given circumstances pt advised that he can opt to not take this or any other medicine accepting that comfort is the primary goal   ?  ?History of Enterococcus bacteremia January 2023 ?  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  ?Chronic diastolic CHF exacerbation ?Monitored volume -intermittent diuretic was dosed as tolerated - appeared mildly dehydrated at time of d/c  ?  ?Chronic atrial fibrillation with acute RVR ?Heart rate controlled -resumed Eliquis - discussed  w/ patient that it would not be unreasonable to stop Eliquis at home if he so chooses, given focus on comfort ?  ?BRBPR ?Appears to have been self-limited -likely hemorrhoidal -resumed Eliquis  prior to d/c w/o recurrence  ?  ?Anemia of acute blood  loss ?Hemoglobin holding steady at 9.5-10 ?  ?Hyponatremia ?Likely simply volume related - Na normal at time of d/c  ?  ?Hypokalemia ?Corrected with supplementation ? ?  ? ?Allergies as of 11/24/2021   ? ?   Reactions  ? Simvastatin Other (See Comments)  ? Mental status changes  ? Celecoxib Other (See Comments)  ? GI UPSET AND INFLAMMATION  ? Codeine Other (See Comments)  ? GI UPSET AND INFLAMMATION  ? Nsaids Other (See Comments)  ? GI UPSET AND INFLAMMATION (can tolerate via IV)  ? Tape Other (See Comments)  ? Medical tape and Band-Aids PULL OFF THE SKIN; please use Coban wrap  ? Cephalexin Itching, Rash  ? Pt tolerated IV rocephin 4/13  ? Latex Rash  ? ?  ? ?  ?Medication List  ?  ? ?STOP taking these medications   ? ?Jardiance 10 MG Tabs tablet ?Generic drug: empagliflozin ?  ?morphine 20 MG/5ML solution ?Replaced by: morphine CONCENTRATE 10 MG/0.5ML Soln concentrated solution ?  ?potassium chloride SA 20 MEQ tablet ?Commonly known as: KLOR-CON M ?  ?torsemide 20 MG tablet ?Commonly known as: DEMADEX ?  ? ?  ? ?TAKE these medications   ? ?acetaminophen 500 MG tablet ?Commonly known as: TYLENOL ?Take 500 mg by mouth every 6 (six) hours as needed for moderate pain or headache. ?  ?albuterol 108 (90 Base) MCG/ACT inhaler ?Commonly known as: ProAir HFA ?Inhale 2 puffs into the lungs every 4 (four) hours as needed for wheezing or shortness of breath. ?  ?ALPRAZolam 0.25 MG tablet ?Commonly known as: Duanne Moron ?Take 1-2 tablets (0.25-0.5 mg total) by mouth 3 (three) times daily as needed for anxiety. ?What changed:  ?medication strength ?how much to take ?when to take this ?reasons to take this ?  ?amoxicillin 500 MG capsule ?Commonly known as: AMOXIL ?Take 2 capsules (1,000 mg total) by mouth every 8 (eight) hours. ?  ?arformoterol 15 MCG/2ML Nebu ?Commonly known as: BROVANA ?TAKE 2 MLS (15 MCG TOTAL) BY NEBULIZATION 2 (TWO) TIMES DAILY. ?  ?bisacodyl 5 MG EC tablet ?Commonly known as: DULCOLAX ?Take 5 mg by mouth daily as  needed for moderate constipation. ?  ?budesonide 0.5 MG/2ML nebulizer solution ?Commonly known as: Pulmicort ?Take 2 mLs (0.5 mg total) by nebulization daily. ?  ?Eliquis 5 MG Tabs tablet ?Generic drug: apixaban ?TAKE 1 TABLET TWICE A DAY ?What changed: how much to take ?  ?escitalopram 10 MG tablet ?Commonly known as: LEXAPRO ?TAKE 1 TABLET DAILY ?  ?esomeprazole 40 MG capsule ?Commonly known as: NexIUM ?Take 1 capsule (40 mg total) by mouth daily before breakfast. ?  ?flecainide 50 MG tablet ?Commonly known as: TAMBOCOR ?Take 1.5 tablets (75 mg total) by mouth 2 (two) times daily. ?  ?fluticasone 50 MCG/ACT nasal spray ?Commonly known as: FLONASE ?Place 1 spray into both nostrils 2 (two) times daily as needed for allergies. Use one spray in each nostril twice daily. ?  ?hydroxypropyl methylcellulose / hypromellose 2.5 % ophthalmic solution ?Commonly known as: ISOPTO TEARS / GONIOVISC ?Place 1-2 drops into both eyes 3 (three) times daily as needed (dry/irritated eyes). ?  ?magnesium hydroxide 400 MG/5ML suspension ?Commonly known as: MILK OF MAGNESIA ?Take 7.5 mLs by mouth  daily as needed (stomach cramps/constipation). ?  ?metoprolol tartrate 25 MG tablet ?Commonly known as: LOPRESSOR ?Take 0.5 tablets (12.5 mg total) by mouth 2 (two) times daily. Please make yearly appt with Dr. Caryl Comes for January 2022 for future refills. Thank you 1st attempt ?  ?morphine CONCENTRATE 10 MG/0.5ML Soln concentrated solution ?Take 0.13 mLs (2.6 mg total) by mouth every hour as needed for severe pain or shortness of breath. ?Replaces: morphine 20 MG/5ML solution ?  ?OXYGEN ?Inhale 6-8.5 L/min into the lungs continuous. ?  ?predniSONE 20 MG tablet ?Commonly known as: DELTASONE ?Take 2 tablets (40 mg total) by mouth daily with breakfast. ?Start taking on: November 25, 2021 ?What changed: how much to take ?  ?rosuvastatin 5 MG tablet ?Commonly known as: CRESTOR ?Take 5 mg by mouth at bedtime. ?  ?triamcinolone cream 0.5 % ?Commonly known  as: KENALOG ?Apply 1 application topically 3 (three) times daily. ?What changed:  ?when to take this ?reasons to take this ?  ?Yupelri 175 MCG/3ML nebulizer solution ?Generic drug: revefenacin ?TAKE

## 2021-11-24 NOTE — Progress Notes (Signed)
Went into rapid RVR heart rate of 160's, asymptomatic. Bp 782 systolic. 10 am dose of tambocoor and lopressor given. MD made aware with order. Heart went down to 70's after an hour. Continue to monitor. ?

## 2021-11-24 NOTE — Progress Notes (Signed)
Complained of on and off shortness of breath with 6 L [place]. Becoming anxious . MD made aware with order. Xanax po given. Continue to monitor. ?

## 2021-11-24 NOTE — Progress Notes (Signed)
Plan to dc home today on PO abx. Due to the severity of the infection, we will use high dose amoxicillin for the strep gallalyticus bacteremia. D/w Dr. Thereasa Solo ? ? ?CrCl 80 ml/min ? ?Amoxicillin 1g PO TID ? ? ?Onnie Boer, PharmD, BCIDP, AAHIVP, CPP ?Infectious Disease Pharmacist ?11/24/2021 2:40 PM ? ? ?

## 2021-11-24 NOTE — Progress Notes (Signed)
Placed on 6 L [place] , instructed to let us know if he is uncomfortable  with it. Pulse ox not done as instructed. ?

## 2021-11-24 NOTE — Progress Notes (Signed)
?  Echocardiogram ?2D Echocardiogram has been performed. ? ?Sean Riley ?11/24/2021, 10:02 AM ?

## 2021-11-24 NOTE — TOC Transition Note (Signed)
Transition of Care (TOC) - CM/SW Discharge Note ? ? ?Patient Details  ?Name: Sean Riley ?MRN: [id number] ?Date of Birth: 1951-04-26 ? ?Transition of Care (TOC) CM/SW Contact:  ?Angelita Ingles, RN ?Phone Number:[phone] ? ?11/24/2021, 3:02 PM ? ? ?Clinical Narrative:    ?Patient has discharge orders. No other TOC needs noted patient to discharge home with family. TOC will sign off ? ? ?  ?Barriers to Discharge: Continued Medical Work up ? ? ?Patient Goals and CMS Choice ?Patient states their goals for this hospitalization and ongoing recovery are:: Wants to get better to go home ?CMS Medicare.gov Compare Post Acute Care list provided to::  (Patient declined already active with [place]) ?Choice offered to / list presented to : Patient, Adult Children ? ?Discharge Placement ?  ?           ?  ?  ?  ?  ? ?Discharge Plan and Services ?In-house Referral: NA ?Discharge Planning Services: CM Consult ?Post Acute Care Choice: NA          ?  ?  ?  ?  ?  ?  ?  ?  ?  ?  ? ?Social Determinants of Health (SDOH) Interventions ?  ? ? ?Readmission Risk Interventions ? ?  11/24/2021  ? 10:22 AM  ?Readmission Risk Prevention Plan  ?Transportation Screening Complete  ?PCP or Specialist Appt within 5-7 Days Complete  ?Home Care Screening Complete  ?Medication Review (RN CM) Referral to Pharmacy  ? ? ? ? ? ?

## 2021-11-24 NOTE — Progress Notes (Signed)
Pt still not fully asleep upon my arrival but he stated "we can try it". Pt was placed on and I adjusted the mask briefly but pt said "that's enough". RN made aware that pt is on BIPAP now. ?

## 2021-11-24 NOTE — Progress Notes (Signed)
PHARMACY NOTE:  ANTIMICROBIAL RENAL DOSAGE ADJUSTMENT ? ?Current antimicrobial regimen includes a mismatch between antimicrobial dosage and estimated renal function.  As per policy approved by the Pharmacy & Therapeutics and Medical Executive Committees, the antimicrobial dosage will be adjusted accordingly. ? ?Current antimicrobial dosage:  Ampicillin 2g IV every 6 hours ? ?Indication: Strep bacteremia ? ?Renal Function: ? ?Estimated Creatinine Clearance: 80.4 mL/min (by C-G formula based on SCr of 0.9 mg/dL). ?'[]'$      On intermittent HD, scheduled: ?'[]'$      On CRRT ?   ?Antimicrobial dosage has been changed to:  Ampicillin 2g IV every 4 hours ? ?Additional comments: ? ?Thank you for allowing pharmacy to be a part of this patient?s care. ? ?Alycia Rossetti, PharmD, BCPS ?Infectious Diseases Clinical Pharmacist ?11/24/2021 10:25 AM  ? ?**Pharmacist phone directory can now be found on [email or web address] ([email or web address]).  Listed under [place]. ?

## 2021-11-24 NOTE — Progress Notes (Signed)
All set for discharge, d/c instructions given to pt. And daughter [name]. Belongings taken home. Pt  claimed that  he is comfortable with 6 L [place]. ?

## 2021-11-26 ENCOUNTER — Telehealth (HOSPITAL_COMMUNITY): Payer: Self-pay | Admitting: Pharmacy Technician

## 2021-11-26 ENCOUNTER — Other Ambulatory Visit (HOSPITAL_COMMUNITY): Payer: Self-pay

## 2021-11-26 NOTE — Telephone Encounter (Signed)
Advanced Heart Failure Patient Advocate Encounter ? ?Received a Eliquis PA denial from [facility] due to insurance not getting enough information to be able to approve the original request. ? ?We did not receive a PA request for Eliquis for this patient. When I attempted to submit the PA for Eliquis, [facility] issued the following statement."This request has been approved using information available on the patient's profile.  ? ?Case GQ:[id number] ? ?Status:Approved ?Coverage Start Date:10/27/2021 ?Coverage End Date:11/26/2022" ? ?30 day co-pay $40. Patient's insurance requires 90 day supplies in the future. Could not see 28 day co-pay, as our pharmacy is not contracted with the patient's 81 day network. ? ?Charlann Boxer, CPhT ? ?

## 2021-11-29 LAB — CULTURE, BLOOD (ROUTINE X 2)
Culture: NO GROWTH
Culture: NO GROWTH
Special Requests: ADEQUATE
Special Requests: ADEQUATE

## 2021-12-02 ENCOUNTER — Telehealth: Payer: Medicare Other | Admitting: Pulmonary Disease

## 2021-12-08 DEATH — deceased

## 2021-12-31 ENCOUNTER — Ambulatory Visit: Payer: Medicare Other | Admitting: Internal Medicine

## 2024-07-18 ENCOUNTER — Other Ambulatory Visit (HOSPITAL_COMMUNITY): Payer: Self-pay
